# Patient Record
Sex: Female | Born: 1960 | Race: Black or African American | Hispanic: No | Marital: Single | Smoking: Light tobacco smoker
Health system: Southern US, Community
[De-identification: ages and names within clinical notes are randomized; demographics above are authoritative.]

## PROBLEM LIST (undated history)

## (undated) DIAGNOSIS — E1140 Type 2 diabetes mellitus with diabetic neuropathy, unspecified: Secondary | ICD-10-CM

## (undated) DIAGNOSIS — I509 Heart failure, unspecified: Secondary | ICD-10-CM

## (undated) DIAGNOSIS — J449 Chronic obstructive pulmonary disease, unspecified: Secondary | ICD-10-CM

## (undated) DIAGNOSIS — K635 Polyp of colon: Secondary | ICD-10-CM

## (undated) DIAGNOSIS — R748 Abnormal levels of other serum enzymes: Secondary | ICD-10-CM

## (undated) DIAGNOSIS — G4700 Insomnia, unspecified: Secondary | ICD-10-CM

## (undated) DIAGNOSIS — I10 Essential (primary) hypertension: Secondary | ICD-10-CM

## (undated) DIAGNOSIS — C50919 Malignant neoplasm of unspecified site of unspecified female breast: Secondary | ICD-10-CM

## (undated) DIAGNOSIS — L97509 Non-pressure chronic ulcer of other part of unspecified foot with unspecified severity: Secondary | ICD-10-CM

## (undated) DIAGNOSIS — I739 Peripheral vascular disease, unspecified: Secondary | ICD-10-CM

## (undated) DIAGNOSIS — I5042 Chronic combined systolic (congestive) and diastolic (congestive) heart failure: Secondary | ICD-10-CM

## (undated) DIAGNOSIS — E119 Type 2 diabetes mellitus without complications: Secondary | ICD-10-CM

## (undated) DIAGNOSIS — F1290 Cannabis use, unspecified, uncomplicated: Secondary | ICD-10-CM

## (undated) DIAGNOSIS — F1020 Alcohol dependence, uncomplicated: Secondary | ICD-10-CM

## (undated) DIAGNOSIS — N183 Chronic kidney disease, stage 3 unspecified: Secondary | ICD-10-CM

## (undated) DIAGNOSIS — D649 Anemia, unspecified: Secondary | ICD-10-CM

## (undated) DIAGNOSIS — G4730 Sleep apnea, unspecified: Secondary | ICD-10-CM

## (undated) DIAGNOSIS — F419 Anxiety disorder, unspecified: Secondary | ICD-10-CM

## (undated) DIAGNOSIS — C801 Malignant (primary) neoplasm, unspecified: Secondary | ICD-10-CM

## (undated) DIAGNOSIS — G4733 Obstructive sleep apnea (adult) (pediatric): Secondary | ICD-10-CM

## (undated) DIAGNOSIS — I890 Lymphedema, not elsewhere classified: Secondary | ICD-10-CM

## (undated) DIAGNOSIS — E785 Hyperlipidemia, unspecified: Secondary | ICD-10-CM

## (undated) DIAGNOSIS — K859 Acute pancreatitis without necrosis or infection, unspecified: Secondary | ICD-10-CM

## (undated) DIAGNOSIS — F17210 Nicotine dependence, cigarettes, uncomplicated: Secondary | ICD-10-CM

## (undated) DIAGNOSIS — M109 Gout, unspecified: Secondary | ICD-10-CM

## (undated) HISTORY — DX: Polyp of colon: K63.5

## (undated) HISTORY — DX: Chronic kidney disease, stage 3 unspecified: N18.30

## (undated) HISTORY — DX: Lymphedema, not elsewhere classified: I89.0

## (undated) HISTORY — DX: Peripheral vascular disease, unspecified: I73.9

## (undated) HISTORY — DX: Nicotine dependence, cigarettes, uncomplicated: F17.210

## (undated) HISTORY — DX: Gout, unspecified: M10.9

## (undated) HISTORY — DX: Anemia, unspecified: D64.9

## (undated) HISTORY — DX: Obstructive sleep apnea (adult) (pediatric): G47.33

## (undated) HISTORY — DX: Type 2 diabetes mellitus with diabetic neuropathy, unspecified: E11.40

## (undated) HISTORY — DX: Alcohol dependence, uncomplicated: F10.20

## (undated) HISTORY — PX: COLONOSCOPY: SHX174

## (undated) HISTORY — DX: Anxiety disorder, unspecified: F41.9

## (undated) HISTORY — DX: Chronic combined systolic (congestive) and diastolic (congestive) heart failure: I50.42

## (undated) HISTORY — DX: Insomnia, unspecified: G47.00

## (undated) HISTORY — PX: ABDOMINAL HYSTERECTOMY: SHX81

## (undated) HISTORY — DX: Sleep apnea, unspecified: G47.30

## (undated) HISTORY — DX: Non-pressure chronic ulcer of other part of unspecified foot with unspecified severity: L97.509

## (undated) HISTORY — DX: Cannabis use, unspecified, uncomplicated: F12.90

## (undated) HISTORY — DX: Malignant neoplasm of unspecified site of unspecified female breast: C50.919

## (undated) NOTE — *Deleted (*Deleted)
Received verbal order from physician for restraints and physician failed to place order. Pt was removed from the restraints at 0937.

---
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
## 1999-08-09 ENCOUNTER — Encounter: Admission: RE | Admit: 1999-08-09 | Discharge: 1999-08-09 | Payer: Self-pay | Admitting: Obstetrics & Gynecology

## 1999-11-15 ENCOUNTER — Emergency Department (HOSPITAL_COMMUNITY): Admission: EM | Admit: 1999-11-15 | Discharge: 1999-11-15 | Payer: Self-pay | Admitting: Emergency Medicine

## 1999-11-15 ENCOUNTER — Encounter: Payer: Self-pay | Admitting: Emergency Medicine

## 2002-01-09 ENCOUNTER — Emergency Department (HOSPITAL_COMMUNITY): Admission: EM | Admit: 2002-01-09 | Discharge: 2002-01-09 | Payer: Self-pay | Admitting: Emergency Medicine

## 2002-01-09 ENCOUNTER — Encounter: Payer: Self-pay | Admitting: Emergency Medicine

## 2004-10-08 ENCOUNTER — Emergency Department (HOSPITAL_COMMUNITY): Admission: EM | Admit: 2004-10-08 | Discharge: 2004-10-08 | Payer: Self-pay | Admitting: Emergency Medicine

## 2014-02-18 DIAGNOSIS — I10 Essential (primary) hypertension: Secondary | ICD-10-CM | POA: Insufficient documentation

## 2014-03-06 DIAGNOSIS — G4733 Obstructive sleep apnea (adult) (pediatric): Secondary | ICD-10-CM | POA: Insufficient documentation

## 2014-04-03 DIAGNOSIS — R928 Other abnormal and inconclusive findings on diagnostic imaging of breast: Secondary | ICD-10-CM | POA: Insufficient documentation

## 2014-04-06 DIAGNOSIS — K219 Gastro-esophageal reflux disease without esophagitis: Secondary | ICD-10-CM | POA: Insufficient documentation

## 2014-04-06 DIAGNOSIS — R5383 Other fatigue: Secondary | ICD-10-CM | POA: Insufficient documentation

## 2014-04-06 DIAGNOSIS — R0683 Snoring: Secondary | ICD-10-CM | POA: Insufficient documentation

## 2014-05-01 DIAGNOSIS — I2510 Atherosclerotic heart disease of native coronary artery without angina pectoris: Secondary | ICD-10-CM

## 2014-05-01 HISTORY — DX: Atherosclerotic heart disease of native coronary artery without angina pectoris: I25.10

## 2014-05-20 DIAGNOSIS — C50911 Malignant neoplasm of unspecified site of right female breast: Secondary | ICD-10-CM | POA: Insufficient documentation

## 2014-06-05 DIAGNOSIS — F32A Depression, unspecified: Secondary | ICD-10-CM | POA: Insufficient documentation

## 2014-06-05 DIAGNOSIS — F419 Anxiety disorder, unspecified: Secondary | ICD-10-CM | POA: Insufficient documentation

## 2014-07-31 HISTORY — PX: MASTECTOMY: SHX3

## 2015-02-06 DIAGNOSIS — I890 Lymphedema, not elsewhere classified: Secondary | ICD-10-CM | POA: Insufficient documentation

## 2015-04-08 ENCOUNTER — Emergency Department (HOSPITAL_COMMUNITY)
Admission: EM | Admit: 2015-04-08 | Discharge: 2015-04-08 | Disposition: A | Discharge status: Home / Self Care | Payer: Medicaid Other | Attending: Emergency Medicine | Admitting: Emergency Medicine

## 2015-04-08 ENCOUNTER — Encounter (HOSPITAL_COMMUNITY): Payer: Self-pay

## 2015-04-08 ENCOUNTER — Emergency Department (HOSPITAL_COMMUNITY): Payer: Medicaid Other

## 2015-04-08 DIAGNOSIS — I10 Essential (primary) hypertension: Secondary | ICD-10-CM | POA: Insufficient documentation

## 2015-04-08 DIAGNOSIS — E1165 Type 2 diabetes mellitus with hyperglycemia: Secondary | ICD-10-CM | POA: Diagnosis not present

## 2015-04-08 DIAGNOSIS — J209 Acute bronchitis, unspecified: Secondary | ICD-10-CM

## 2015-04-08 DIAGNOSIS — R079 Chest pain, unspecified: Secondary | ICD-10-CM | POA: Diagnosis present

## 2015-04-08 DIAGNOSIS — Z87891 Personal history of nicotine dependence: Secondary | ICD-10-CM | POA: Diagnosis not present

## 2015-04-08 DIAGNOSIS — J440 Chronic obstructive pulmonary disease with acute lower respiratory infection: Secondary | ICD-10-CM | POA: Diagnosis not present

## 2015-04-08 DIAGNOSIS — R739 Hyperglycemia, unspecified: Secondary | ICD-10-CM

## 2015-04-08 DIAGNOSIS — Z853 Personal history of malignant neoplasm of breast: Secondary | ICD-10-CM | POA: Diagnosis not present

## 2015-04-08 HISTORY — DX: Malignant (primary) neoplasm, unspecified: C80.1

## 2015-04-08 HISTORY — DX: Hyperlipidemia, unspecified: E78.5

## 2015-04-08 HISTORY — DX: Chronic obstructive pulmonary disease, unspecified: J44.9

## 2015-04-08 HISTORY — DX: Type 2 diabetes mellitus without complications: E11.9

## 2015-04-08 HISTORY — DX: Essential (primary) hypertension: I10

## 2015-04-08 LAB — CBC
HCT: 40.8 % (ref 36.0–46.0)
Hemoglobin: 14 g/dL (ref 12.0–15.0)
MCH: 31.5 pg (ref 26.0–34.0)
MCHC: 34.3 g/dL (ref 30.0–36.0)
MCV: 91.7 fL (ref 78.0–100.0)
Platelets: 286 10*3/uL (ref 150–400)
RBC: 4.45 MIL/uL (ref 3.87–5.11)
RDW: 13.8 % (ref 11.5–15.5)
WBC: 7.7 10*3/uL (ref 4.0–10.5)

## 2015-04-08 LAB — BASIC METABOLIC PANEL
Anion gap: 10 (ref 5–15)
BUN: 17 mg/dL (ref 6–20)
CO2: 27 mmol/L (ref 22–32)
Calcium: 9.6 mg/dL (ref 8.9–10.3)
Chloride: 96 mmol/L — ABNORMAL LOW (ref 101–111)
Creatinine, Ser: 1.02 mg/dL — ABNORMAL HIGH (ref 0.44–1.00)
GFR calc Af Amer: >60 mL/min (ref >60)
GFR calc non Af Amer: >60 mL/min (ref >60)
Glucose, Bld: 513 mg/dL — ABNORMAL HIGH (ref 65–99)
Potassium: 4.2 mmol/L (ref 3.5–5.1)
Sodium: 133 mmol/L — ABNORMAL LOW (ref 135–145)

## 2015-04-08 LAB — I-STAT TROPONIN, ED: Troponin i, poc: 0.05 ng/mL (ref 0.00–0.08)

## 2015-04-08 LAB — CBG MONITORING, ED: Glucose-Capillary: 324 mg/dL — ABNORMAL HIGH (ref 65–99)

## 2015-04-08 MED ORDER — IPRATROPIUM-ALBUTEROL 0.5-2.5 (3) MG/3ML IN SOLN
3.0000 mL | Freq: Once | RESPIRATORY_TRACT | Status: AC
  Administered 2015-04-08: 3 mL via RESPIRATORY_TRACT
  Filled 2015-04-08: qty 3

## 2015-04-08 MED ORDER — AZITHROMYCIN 250 MG PO TABS: ORAL_TABLET | ORAL | Status: DC

## 2015-04-08 MED ORDER — INSULIN ASPART 100 UNIT/ML ~~LOC~~ SOLN
10.0000 [IU] | Freq: Once | SUBCUTANEOUS | Status: AC
  Administered 2015-04-08: 10 [IU] via INTRAVENOUS
  Filled 2015-04-08: qty 1

## 2015-04-08 MED ORDER — HYDROCOD POLST-CPM POLST ER 10-8 MG/5ML PO SUER: 5.0000 mL | Freq: Two times a day (BID) | ORAL | Status: DC | PRN

## 2015-04-08 MED ORDER — SODIUM CHLORIDE 0.9 % IV BOLUS (SEPSIS)
1000.0000 mL | Freq: Once | INTRAVENOUS | Status: AC
  Administered 2015-04-08: 1000 mL via INTRAVENOUS

## 2015-04-08 NOTE — ED Provider Notes (Signed)
CSN: [id number]     Arrival date & time 04/08/15  0448 History   First MD Initiated Contact with Patient 04/08/15 0534     Chief Complaint  Patient presents with  . Chest Pain     (Consider location/radiation/quality/duration/timing/severity/associated sxs/prior Treatment) HPI Comments: Patient is a 54 year old female with history of hypertension, diabetes, and COPD. She presents for evaluation of chest congestion and pressure. She reports URI-like symptoms for the past 2 weeks that are worsening. She states that she feels very weak, tired, has no energy, and is having difficulty sleeping at night due to the cough.  Patient is a 54 y.o. female presenting with chest pain. The history is provided by the patient.  Chest Pain Pain location:  Substernal area Pain quality: tightness   Pain radiates to:  Does not radiate Pain radiates to the back: no   Pain severity:  Moderate Onset quality:  Gradual Duration:  2 weeks Timing:  Constant Progression:  Worsening Chronicity:  New Relieved by:  Nothing Worsened by:  Nothing tried Ineffective treatments:  None tried   Past Medical History  Diagnosis Date  . Cancer (HCC)     breast  . Hypertension   . Diabetes mellitus without complication ([place])   . COPD (chronic obstructive pulmonary disease) ([place])   . Hyperlipemia    Past Surgical History  Procedure Laterality Date  . Mastectomy Right April 2016  . Abdominal hysterectomy     No family history on file. Social History  Substance Use Topics  . Smoking status: Former Smoker    Quit date: 03/24/2015  . Smokeless tobacco: Never Used  . Alcohol Use: Yes     Comment: Beer - "on the weekends hanging out, maybe 2 or 3"    OB History    No data available     Review of Systems  Cardiovascular: Positive for chest pain.  All other systems reviewed and are negative.     Allergies  Aspirin  Home Medications   Prior to Admission medications   Not on File   BP 131/77 mmHg   Pulse 89  Temp(Src) 98 F (36.7 C) (Oral)  Resp 8  Ht 5\' 6"  (1.676 m)  Wt 198 lb (89.812 kg)  BMI 31.97 kg/m2  SpO2 98% Physical Exam  Constitutional: She is oriented to person, place, and time. She appears well-developed and well-nourished. No distress.  HENT:  Head: Normocephalic and atraumatic.  Mouth/Throat: Oropharynx is clear and moist.  Neck: Normal range of motion. Neck supple.  Cardiovascular: Normal rate and regular rhythm.  Exam reveals no gallop and no friction rub.   No murmur heard. Pulmonary/Chest: Effort normal. No respiratory distress. She has wheezes. She has no rales.  There are scattered rhonchi bilaterally.  Abdominal: Soft. Bowel sounds are normal. She exhibits no distension. There is no tenderness.  Musculoskeletal: Normal range of motion. She exhibits no edema.  Neurological: She is alert and oriented to person, place, and time.  Skin: Skin is warm and dry. She is not diaphoretic.  Nursing note and vitals reviewed.   ED Course  Procedures (including critical care time) Labs Review Labs Reviewed  CBC  BASIC METABOLIC PANEL  Randolm Idol, ED    Imaging Review Dg Chest 2 View  04/08/2015  CLINICAL DATA:  54 year old female with mid chest pain EXAM: CHEST  2 VIEW COMPARISON:  None. FINDINGS: Two views of the chest demonstrate minimal bibasilar atelectatic changes. No focal consolidation, pleural effusion, or pneumothorax. The cardiac silhouette is  within normal limits. The osseous structures are grossly unremarkable. IMPRESSION: No active cardiopulmonary disease. Electronically Signed   By: Anner Crete M.D.   On: 04/08/2015 05:32   I have personally reviewed and evaluated these images and lab results as part of my medical decision-making.   EKG Interpretation   Date/Time:  Thursday April 08 2015 04:57:19 EST Ventricular Rate:  90 PR Interval:  128 QRS Duration: 74 QT Interval:  352 QTC Calculation: 431 R Axis:   59 Text Interpretation:   Sinus rhythm Probable left atrial enlargement  Nonspecific T abnormalities, lateral leads Confirmed by Rebbeca Sheperd  MD, Israel Werts  ([id number]) on 04/08/2015 6:08:35 AM      MDM   Final diagnoses:  None    Patient is a 54 year old female who presents here with complaints of weakness, chest congestion, productive cough for the past 2 weeks. She presents today with complaints of severe weakness and inability to rest. She is normally seen and [place], however is here for her son's funeral.  Her workup today reveals no cardiac abnormality. She has nonspecific T wave changes on her EKG, however negative troponin. Her sugar is elevated at 513, however there is no evidence for ketoacidosis. She will be treated with IV fluids and insulin. She states that she has been out of her diabetes medications for several days due to the funeral. She is to call her doctor to make arrangements to have these medications refilled.    Veryl Speak, MD 04/08/15 [phone]

## 2015-04-08 NOTE — ED Notes (Signed)
Patient transported to X-ray 

## 2015-04-08 NOTE — ED Provider Notes (Signed)
Patient accepted in sign out pending treatment for non acidotic hyperglycemia.  Patient reports not having DM medications for long period of time.  On my reevaluation patient in no acute distress.  Says that she will follow up outpatient to get her chronic medical conditions managed.  Agree symptoms appear infectious and patient does admit to being a smoker and getting URI every year. Repeat glucose 324. Patient discharged in stable condition.  Strict return precautions given.  Harvel Quale, MD 04/08/15 [phone]

## 2015-04-08 NOTE — ED Notes (Signed)
Pt also complaining of left wrist pain, pt states that she doesn't know what happened to it. Slight swelling noted.

## 2015-04-08 NOTE — Discharge Instructions (Signed)
Call your primary doctor for refills of your diabetic medications.  Zithromax as prescribed.  Tussionex as prescribed as needed for cough.   Hyperglycemia Hyperglycemia occurs when the glucose (sugar) in your blood is too high. Hyperglycemia can happen for many reasons, but it most often happens to people who do not know they have diabetes or are not managing their diabetes properly.  CAUSES  Whether you have diabetes or not, there are other causes of hyperglycemia. Hyperglycemia can occur when you have diabetes, but it can also occur in other situations that you might not be as aware of, such as: Diabetes  If you have diabetes and are having problems controlling your blood glucose, hyperglycemia could occur because of some of the following reasons:  Not following your meal plan.  Not taking your diabetes medications or not taking it properly.  Exercising less or doing less activity than you normally do.  Being sick. Pre-diabetes  This cannot be ignored. Before people develop Type 2 diabetes, they almost always have "pre-diabetes." This is when your blood glucose levels are higher than normal, but not yet high enough to be diagnosed as diabetes. Research has shown that some long-term damage to the body, especially the heart and circulatory system, may already be occurring during pre-diabetes. If you take action to manage your blood glucose when you have pre-diabetes, you may delay or prevent Type 2 diabetes from developing. Stress  If you have diabetes, you may be "diet" controlled or on oral medications or insulin to control your diabetes. However, you may find that your blood glucose is higher than usual in the hospital whether you have diabetes or not. This is often referred to as "stress hyperglycemia." Stress can elevate your blood glucose. This happens because of hormones put out by the body during times of stress. If stress has been the cause of your high blood glucose, it can be  followed regularly by your caregiver. That way he/she can make sure your hyperglycemia does not continue to get worse or progress to diabetes. Steroids  Steroids are medications that act on the infection fighting system (immune system) to block inflammation or infection. One side effect can be a rise in blood glucose. Most people can produce enough extra insulin to allow for this rise, but for those who cannot, steroids make blood glucose levels go even higher. It is not unusual for steroid treatments to "uncover" diabetes that is developing. It is not always possible to determine if the hyperglycemia will go away after the steroids are stopped. A special blood test called an A1c is sometimes done to determine if your blood glucose was elevated before the steroids were started. SYMPTOMS  Thirsty.  Frequent urination.  Dry mouth.  Blurred vision.  Tired or fatigue.  Weakness.  Sleepy.  Tingling in feet or leg. DIAGNOSIS  Diagnosis is made by monitoring blood glucose in one or all of the following ways:  A1c test. This is a chemical found in your blood.  Fingerstick blood glucose monitoring.  Laboratory results. TREATMENT  First, knowing the cause of the hyperglycemia is important before the hyperglycemia can be treated. Treatment may include, but is not be limited to:  Education.  Change or adjustment in medications.  Change or adjustment in meal plan.  Treatment for an illness, infection, etc.  More frequent blood glucose monitoring.  Change in exercise plan.  Decreasing or stopping steroids.  Lifestyle changes. HOME CARE INSTRUCTIONS   Test your blood glucose as directed.  Exercise  regularly. Your caregiver will give you instructions about exercise. Pre-diabetes or diabetes which comes on with stress is helped by exercising.  Eat wholesome, balanced meals. Eat often and at regular, fixed times. Your caregiver or nutritionist will give you a meal plan to guide  your sugar intake.  Being at an ideal weight is important. If needed, losing as little as 10 to 15 pounds may help improve blood glucose levels. SEEK MEDICAL CARE IF:   You have questions about medicine, activity, or diet.  You continue to have symptoms (problems such as increased thirst, urination, or weight gain). SEEK IMMEDIATE MEDICAL CARE IF:   You are vomiting or have diarrhea.  Your breath smells fruity.  You are breathing faster or slower.  You are very sleepy or incoherent.  You have numbness, tingling, or pain in your feet or hands.  You have chest pain.  Your symptoms get worse even though you have been following your caregiver's orders.  If you have any other questions or concerns.   This information is not intended to replace advice given to you by your health care provider. Make sure you discuss any questions you have with your health care provider.   Document Released: 10/11/2000 Document Revised: 07/10/2011 Document Reviewed: 12/22/2014 Elsevier Interactive Patient Education 2016 [employer]  Acute Bronchitis Bronchitis is inflammation of the airways that extend from the windpipe into the lungs (bronchi). The inflammation often causes mucus to develop. This leads to a cough, which is the most common symptom of bronchitis.  In acute bronchitis, the condition usually develops suddenly and goes away over time, usually in a couple weeks. Smoking, allergies, and asthma can make bronchitis worse. Repeated episodes of bronchitis may cause further lung problems.  CAUSES Acute bronchitis is most often caused by the same virus that causes a cold. The virus can spread from person to person (contagious) through coughing, sneezing, and touching contaminated objects. SIGNS AND SYMPTOMS   Cough.   Fever.   Coughing up mucus.   Body aches.   Chest congestion.   Chills.   Shortness of breath.   Sore throat.  DIAGNOSIS  Acute bronchitis is usually  diagnosed through a physical exam. Your health care provider will also ask you questions about your medical history. Tests, such as chest X-rays, are sometimes done to rule out other conditions.  TREATMENT  Acute bronchitis usually goes away in a couple weeks. Oftentimes, no medical treatment is necessary. Medicines are sometimes given for relief of fever or cough. Antibiotic medicines are usually not needed but may be prescribed in certain situations. In some cases, an inhaler may be recommended to help reduce shortness of breath and control the cough. A cool mist vaporizer may also be used to help thin bronchial secretions and make it easier to clear the chest.  HOME CARE INSTRUCTIONS  Get plenty of rest.   Drink enough fluids to keep your urine clear or pale yellow (unless you have a medical condition that requires fluid restriction). Increasing fluids may help thin your respiratory secretions (sputum) and reduce chest congestion, and it will prevent dehydration.   Take medicines only as directed by your health care provider.  If you were prescribed an antibiotic medicine, finish it all even if you start to feel better.  Avoid smoking and secondhand smoke. Exposure to cigarette smoke or irritating chemicals will make bronchitis worse. If you are a smoker, consider using nicotine gum or skin patches to help control withdrawal symptoms. Quitting smoking will help  your lungs heal faster.   Reduce the chances of another bout of acute bronchitis by washing your hands frequently, avoiding people with cold symptoms, and trying not to touch your hands to your mouth, nose, or eyes.   Keep all follow-up visits as directed by your health care provider.  SEEK MEDICAL CARE IF: Your symptoms do not improve after 1 week of treatment.  SEEK IMMEDIATE MEDICAL CARE IF:  You develop an increased fever or chills.   You have chest pain.   You have severe shortness of breath.  You have bloody sputum.    You develop dehydration.  You faint or repeatedly feel like you are going to pass out.  You develop repeated vomiting.  You develop a severe headache. MAKE SURE YOU:   Understand these instructions.  Will watch your condition.  Will get help right away if you are not doing well or get worse.   This information is not intended to replace advice given to you by your health care provider. Make sure you discuss any questions you have with your health care provider.   Document Released: 05/25/2004 Document Revised: 05/08/2014 Document Reviewed: 10/08/2012 Elsevier Interactive Patient Education [employer].

## 2015-04-08 NOTE — ED Notes (Signed)
Per [facility]: Pt from home, c/o chest pressure, burning sensation that started a week a go that has progressively getting worse. Pt alsop has a cough, and that the chest pain gets worse with cough. Cough started about 2 weeks ago, non productive. Pt states that she was running a fever when the cough started, but does not have a fever. Pt did not measure temperature. Lung sounds "congested". 12 lead unremarkable. No nausea, no vomiting, no diarrhea. Pt does have shortness of breath when she gets up and walks around, about 1 week.

## 2015-04-08 NOTE — ED Notes (Signed)
   CBG 324   

## 2015-04-24 DIAGNOSIS — I739 Peripheral vascular disease, unspecified: Secondary | ICD-10-CM | POA: Insufficient documentation

## 2015-04-24 DIAGNOSIS — F1410 Cocaine abuse, uncomplicated: Secondary | ICD-10-CM | POA: Insufficient documentation

## 2016-01-16 DIAGNOSIS — F17200 Nicotine dependence, unspecified, uncomplicated: Secondary | ICD-10-CM | POA: Insufficient documentation

## 2016-06-22 DIAGNOSIS — G479 Sleep disorder, unspecified: Secondary | ICD-10-CM | POA: Insufficient documentation

## 2016-09-08 DIAGNOSIS — G8929 Other chronic pain: Secondary | ICD-10-CM | POA: Insufficient documentation

## 2016-09-08 DIAGNOSIS — R1110 Vomiting, unspecified: Secondary | ICD-10-CM | POA: Insufficient documentation

## 2016-09-26 DIAGNOSIS — R3129 Other microscopic hematuria: Secondary | ICD-10-CM | POA: Insufficient documentation

## 2016-09-27 ENCOUNTER — Encounter: Payer: Self-pay | Admitting: Nurse Practitioner

## 2017-03-01 DIAGNOSIS — B9681 Helicobacter pylori [H. pylori] as the cause of diseases classified elsewhere: Secondary | ICD-10-CM | POA: Insufficient documentation

## 2017-10-24 DIAGNOSIS — N390 Urinary tract infection, site not specified: Secondary | ICD-10-CM | POA: Insufficient documentation

## 2017-10-24 DIAGNOSIS — Z794 Long term (current) use of insulin: Secondary | ICD-10-CM | POA: Insufficient documentation

## 2017-10-24 DIAGNOSIS — E1165 Type 2 diabetes mellitus with hyperglycemia: Secondary | ICD-10-CM | POA: Insufficient documentation

## 2017-10-24 DIAGNOSIS — N3281 Overactive bladder: Secondary | ICD-10-CM | POA: Insufficient documentation

## 2018-02-20 DIAGNOSIS — E782 Mixed hyperlipidemia: Secondary | ICD-10-CM | POA: Insufficient documentation

## 2018-02-20 DIAGNOSIS — Z8673 Personal history of transient ischemic attack (TIA), and cerebral infarction without residual deficits: Secondary | ICD-10-CM | POA: Insufficient documentation

## 2018-02-20 DIAGNOSIS — E1110 Type 2 diabetes mellitus with ketoacidosis without coma: Secondary | ICD-10-CM | POA: Insufficient documentation

## 2018-10-31 ENCOUNTER — Other Ambulatory Visit: Payer: Self-pay | Admitting: Family

## 2018-10-31 DIAGNOSIS — Z1231 Encounter for screening mammogram for malignant neoplasm of breast: Secondary | ICD-10-CM

## 2018-11-27 ENCOUNTER — Other Ambulatory Visit: Payer: Self-pay | Admitting: Family

## 2018-11-28 ENCOUNTER — Encounter: Payer: Self-pay | Admitting: Gastroenterology

## 2018-11-28 ENCOUNTER — Other Ambulatory Visit: Payer: Self-pay | Admitting: Gastroenterology

## 2018-12-02 ENCOUNTER — Ambulatory Visit: Payer: Self-pay | Admitting: Gastroenterology

## 2018-12-11 ENCOUNTER — Encounter: Payer: Self-pay | Admitting: Hematology and Oncology

## 2018-12-11 ENCOUNTER — Telehealth: Payer: Self-pay | Admitting: Hematology and Oncology

## 2018-12-11 NOTE — Telephone Encounter (Signed)
Received a new patient referral from Morgan Folks, NP from [facility] for hx of breast cancer. I cld and spoke to Morgan Beasley to schedule a new pt appt w/Dr. Lindi Adie on 9/9 at 1pm. She's been made aware to arrive 15 minutes early. Letter mailed.

## 2018-12-12 ENCOUNTER — Other Ambulatory Visit: Payer: Self-pay | Admitting: Family

## 2018-12-12 ENCOUNTER — Other Ambulatory Visit: Payer: Self-pay

## 2018-12-12 DIAGNOSIS — N63 Unspecified lump in unspecified breast: Secondary | ICD-10-CM

## 2018-12-19 ENCOUNTER — Ambulatory Visit: Payer: Self-pay | Admitting: Internal Medicine

## 2018-12-20 ENCOUNTER — Other Ambulatory Visit: Payer: Self-pay | Admitting: Family

## 2018-12-20 ENCOUNTER — Other Ambulatory Visit: Payer: Self-pay

## 2018-12-20 ENCOUNTER — Ambulatory Visit
Admission: RE | Admit: 2018-12-20 | Discharge: 2018-12-20 | Disposition: A | Discharge status: Home / Self Care | Payer: Medicare Other | Source: Ambulatory Visit | Attending: Family | Admitting: Family

## 2018-12-20 DIAGNOSIS — N644 Mastodynia: Secondary | ICD-10-CM

## 2018-12-20 DIAGNOSIS — Z9011 Acquired absence of right breast and nipple: Secondary | ICD-10-CM

## 2018-12-20 DIAGNOSIS — N63 Unspecified lump in unspecified breast: Secondary | ICD-10-CM

## 2018-12-24 ENCOUNTER — Ambulatory Visit: Payer: Self-pay | Admitting: Nurse Practitioner

## 2019-01-07 NOTE — Progress Notes (Signed)
[name] NOTE  Patient Care Team: Sonia Side., FNP as PCP - General (Family Medicine)  CHIEF COMPLAINTS/PURPOSE OF CONSULTATION:  History of right breast cancer  HISTORY OF PRESENTING ILLNESS:  Morgan Beasley 58 y.o. female is here because of a history of stage IIA right breast invasive ductal carcinoma in 2015. The cancer was detected on a routine screening mammogram on 03/30/14, which showed an abnormality at the 10:00 position. Biopsy on 04/15/14 showed invasive ductal carcinoma, grade 2, ER+ 90%, PR+ 60%, HER-2 negative (0). She underwent a right lumpectomy with sentinel lymph node biopsy with Dr. Mable Fill Howard-McNatt on 06/15/14 for which pathology showed invasive ductal carcinoma, 3-4cm, positive posterior and lateral margins with DCIS, 0/3 lymph nodes positive. She underwent a right mastectomy on 08/03/14 for which pathology showed multiple minute foci of intermediate grade DCIS and clear margins. Oncotype score was 17, with an 11% risk of distant recurrence in 1 years without systemic treatment and as a result she did not undergo chemotherapy. She began anti-estrogen therapy with letrozole for one month from 10/14/14-10/2014 but discontinued it due to side effects. She again began anti-estrogen therapy with exemestane on 10/20/16 for one week and discontinued it due to side effects. She declined further anti-estrogen therapy. She has been followed at [facility] by Dr. Harvie Heck and Dr. Sol Blazing. She has a history of a breast mass at age 16, for which she underwent a surgical resection and radiation therapy.   Mammogram of the left breast and bilateral US on 12/20/18 after the patient palpated lumps in the left breast and pain at her right mastectomy site showed no evidence of malignancy. She presents to the clinic today for initial evaluation and transfer of care.   I reviewed her records extensively and collaborated the history with the patient.   SUMMARY OF ONCOLOGIC HISTORY: Oncology History  Malignant neoplasm of upper-outer quadrant of right breast in female, estrogen receptor positive ([place])  04/15/2014 Initial Diagnosis   screening mammogram on 03/30/14, which showed an abnormality at the 10:00 position. Biopsy on 04/15/14 showed invasive ductal carcinoma, grade 2, ER+ 90%, PR+ 60%, HER-2 negative (0)   06/15/2014 Surgery   Right lumpectomy with sentinel lymph node biopsy with Dr. Mable Fill Howard-McNatt on 06/15/14 for which pathology showed invasive ductal carcinoma, 3-4cm, positive posterior and lateral margins with DCIS, 0/3 lymph nodes positive. She underwent a right mastectomy on 08/03/14 for which pathology showed multiple minute foci of intermediate grade DCIS and clear margins.   06/17/2014 Oncotype testing   Oncotype DX recurrence score 17: 11% risk of distant recurrence   10/14/2014 - 10/2014 Anti-estrogen oral therapy   Letrozole for 1 month stopped for toxicities, exemestane started 622 2018 x 1 week discontinued due to toxicities.  Refused further antiestrogen therapy  followed at [facility] by Dr. Harvie Heck and Dr. Sol Blazing     MEDICAL HISTORY:  Past Medical History:  Diagnosis Date  . Alcohol dependence ([place])   . Anxiety   . Breast cancer ([place])   . CHF (congestive heart failure) ([place])   . Cigarette nicotine dependence   . Colon polyps   . COPD (chronic obstructive pulmonary disease) ([place])   . Diabetes mellitus without complication ([place])   . Diabetic neuropathy ([place])   . Elevated lipase   . Hyperlipemia   . Hypertension   . Insomnia   . Lymphedema   . Marijuana use   . OSA treated with BiPAP   .  PAD (peripheral artery disease) ([place])   . Ulcer of foot ([place])    right    SURGICAL HISTORY: Past Surgical History:  Procedure Laterality Date  . ABDOMINAL HYSTERECTOMY    . COLONOSCOPY     [facility] hospital  . MASTECTOMY Right April 2016    SOCIAL HISTORY: Social History   Socioeconomic History   . Marital status: Single    Spouse name: Not on file  . Number of children: Not on file  . Years of education: Not on file  . Highest education level: Not on file  Occupational History  . Not on file  Social Needs  . Financial resource strain: Not on file  . Food insecurity    Worry: Not on file    Inability: Not on file  . Transportation needs    Medical: Not on file    Non-medical: Not on file  Tobacco Use  . Smoking status: Former Smoker    Quit date: 03/24/2015    Years since quitting: 3.7  . Smokeless tobacco: Never Used  Substance and Sexual Activity  . Alcohol use: Yes    Comment: Beer - "on the weekends hanging out, maybe 2 or 3"   . Drug use: Yes    Types: Marijuana    Comment: Last use November   . Sexual activity: Not on file  Lifestyle  . Physical activity    Days per week: Not on file    Minutes per session: Not on file  . Stress: Not on file  Relationships  . Social Herbalist on phone: Not on file    Gets together: Not on file    Attends religious service: Not on file    Active member of club or organization: Not on file    Attends meetings of clubs or organizations: Not on file    Relationship status: Not on file  . Intimate partner violence    Fear of current or ex partner: Not on file    Emotionally abused: Not on file    Physically abused: Not on file    Forced sexual activity: Not on file  Other Topics Concern  . Not on file  Social History Narrative  . Not on file    FAMILY HISTORY: Family History  Problem Relation Age of Onset  . Diabetes Other   . Heart disease Other     ALLERGIES:  is allergic to aspirin.  MEDICATIONS:  Current Outpatient Medications  Medication Sig Dispense Refill  . albuterol (PROAIR HFA) 108 (90 Base) MCG/ACT inhaler Inhale 2 puffs into the lungs every 6 (six) hours as needed.    Marland Kitchen amitriptyline (ELAVIL) 10 MG tablet Take 10 mg by mouth at bedtime.    Marland Kitchen atorvastatin (LIPITOR) 40 MG tablet Take 40  mg by mouth daily.    Marland Kitchen azithromycin (ZITHROMAX Z-PAK) 250 MG tablet 2 po day one, then 1 daily x 4 days 6 tablet 0  . budesonide-formoterol (SYMBICORT) 160-4.5 MCG/ACT inhaler Inhale 2 puffs into the lungs 2 (two) times daily.    . chlorpheniramine-HYDROcodone (TUSSIONEX PENNKINETIC ER) 10-8 MG/5ML SUER Take 5 mLs by mouth every 12 (twelve) hours as needed for cough. 140 mL 0  . cilostazol (PLETAL) 100 MG tablet Take 100 mg by mouth 2 (two) times daily.    . clopidogrel (PLAVIX) 75 MG tablet Take 75 mg by mouth daily.    . cyclobenzaprine (FLEXERIL) 5 MG tablet Take 1 tablet by mouth at bedtime.    Marland Kitchen  diltiazem (DILACOR XR) 120 MG 24 hr capsule Take 120 mg by mouth daily.    . furosemide (LASIX) 40 MG tablet Take 40 mg by mouth daily.    Marland Kitchen gabapentin (NEURONTIN) 800 MG tablet Take 800 mg by mouth 3 (three) times daily.    Marland Kitchen glimepiride (AMARYL) 4 MG tablet Take 4 mg by mouth daily with breakfast.    . HYDROcodone-acetaminophen (NORCO/VICODIN) 5-325 MG tablet Take 1 tablet by mouth every 6 (six) hours as needed for moderate pain.    Marland Kitchen insulin glargine (LANTUS) 100 UNIT/ML injection Inject 80 Units into the skin daily.    . insulin lispro (HUMALOG) 100 UNIT/ML injection Inject 14 Units into the skin 3 (three) times daily before meals.    . mirabegron ER (MYRBETRIQ) 50 MG TB24 tablet Take 50 mg by mouth daily.    . ondansetron (ZOFRAN) 4 MG tablet Take 4 mg by mouth every 8 (eight) hours as needed for nausea or vomiting.    . potassium chloride (K-DUR) 10 MEQ tablet Take 10 mEq by mouth daily.    . promethazine (PHENERGAN) 25 MG tablet Take 25 mg by mouth every 6 (six) hours as needed for nausea or vomiting.     No current facility-administered medications for this visit.     REVIEW OF SYSTEMS:   Constitutional: Denies fevers, chills or abnormal night sweats Eyes: Denies blurriness of vision, double vision or watery eyes Ears, nose, mouth, throat, and face: Denies mucositis or sore throat  Respiratory: Denies cough, dyspnea or wheezes Cardiovascular: Denies palpitation, chest discomfort or lower extremity swelling Gastrointestinal:  Denies nausea, heartburn or change in bowel habits Skin: Denies abnormal skin rashes Lymphatics: Denies new lymphadenopathy or easy bruising Neurological:Denies numbness, tingling or new weaknesses Behavioral/Psych: Mood is stable, no new changes  Breast: s/p right mastectomy All other systems were reviewed with the patient and are negative.  PHYSICAL EXAMINATION: ECOG PERFORMANCE STATUS: 1 - Symptomatic but completely ambulatory  Vitals:   01/08/19 1333  BP: (!) 152/75  Pulse: (!) 101  Resp: 18  Temp: 98.5 F (36.9 C)  SpO2: 100%   Filed Weights   01/08/19 1333  Weight: 202 lb 1.6 oz (91.7 kg)    GENERAL:alert, no distress and comfortable SKIN: skin color, texture, turgor are normal, no rashes or significant lesions EYES: normal, conjunctiva are pink and non-injected, sclera clear OROPHARYNX:no exudate, no erythema and lips, buccal mucosa, and tongue normal  NECK: supple, thyroid normal size, non-tender, without nodularity LYMPH:  no palpable lymphadenopathy in the cervical, axillary or inguinal LUNGS: clear to auscultation and percussion with normal breathing effort HEART: regular rate & rhythm and no murmurs and no lower extremity edema ABDOMEN:abdomen soft, non-tender and normal bowel sounds Musculoskeletal:no cyanosis of digits and no clubbing  PSYCH: alert & oriented x 3 with fluent speech NEURO: no focal motor/sensory deficits BREAST: No palpable nodules in breast. No palpable axillary or supraclavicular lymphadenopathy (exam performed in the presence of a chaperone)   LABORATORY DATA:  I have reviewed the data as listed Lab Results  Component Value Date   WBC 7.7 04/08/2015   HGB 14.0 04/08/2015   HCT 40.8 04/08/2015   MCV 91.7 04/08/2015   PLT 286 04/08/2015   Lab Results  Component Value Date   NA 133 (L)  04/08/2015   K 4.2 04/08/2015   CL 96 (L) 04/08/2015   CO2 27 04/08/2015    RADIOGRAPHIC STUDIES: I have personally reviewed the radiological reports and agreed with the findings  in the report.  ASSESSMENT AND PLAN:  Malignant neoplasm of upper-outer quadrant of right breast in female, estrogen receptor positive ([place]) 06/15/2014: Screening mammogram detected right breast IDC grade 2, ER 90%, PR 60%, HER-2 negative, status post initial lumpectomy followed by mastectomy, final size 3 to 4 cm 0/3 lymph nodes negative with multifocal DCIS on mastectomy specimen. Oncotype DX score 17: Distant recurrence rate 11% Could not tolerate antiestrogen therapy  Breast cancer surveillance: Left breast mammogram 12/20/2018: Benign, ultrasound right reconstructed breasts normal. Severe left breast pain: Unclear etiology.  Mammogram ultrasound did not show any cause. We will obtain a breast MRI for further evaluation. I will see her after the breast MRI to discuss results.  All questions were answered. The patient knows to call the clinic with any problems, questions or concerns.   Rulon Eisenmenger, MD 01/08/2019    I, Molly Dorshimer, am acting as scribe for Nicholas Lose, MD.  I have reviewed the above documentation for accuracy and completeness, and I agree with the above.

## 2019-01-08 ENCOUNTER — Other Ambulatory Visit: Payer: Self-pay

## 2019-01-08 ENCOUNTER — Inpatient Hospital Stay: Payer: Medicare Other | Attending: Hematology and Oncology | Admitting: Hematology and Oncology

## 2019-01-08 DIAGNOSIS — Z853 Personal history of malignant neoplasm of breast: Secondary | ICD-10-CM | POA: Diagnosis present

## 2019-01-08 DIAGNOSIS — Z1231 Encounter for screening mammogram for malignant neoplasm of breast: Secondary | ICD-10-CM

## 2019-01-08 DIAGNOSIS — C50411 Malignant neoplasm of upper-outer quadrant of right female breast: Secondary | ICD-10-CM | POA: Insufficient documentation

## 2019-01-08 DIAGNOSIS — Z170 Estrogen receptor positive status [ER+]: Secondary | ICD-10-CM

## 2019-01-08 NOTE — Assessment & Plan Note (Signed)
06/15/2014: Screening mammogram detected right breast IDC grade 2, ER 90%, PR 60%, HER-2 negative, status post initial lumpectomy followed by mastectomy, final size 3 to 4 cm 0/3 lymph nodes negative with multifocal DCIS on mastectomy specimen. Oncotype DX score 17: Distant recurrence rate 11% Could not tolerate antiestrogen therapy  Breast cancer surveillance: Left breast mammogram 12/20/2018: Benign, ultrasound right reconstructed breasts normal. Return to clinic in 1 year for follow-up

## 2019-01-09 ENCOUNTER — Telehealth: Payer: Self-pay | Admitting: Hematology and Oncology

## 2019-01-09 NOTE — Telephone Encounter (Signed)
I talk with patient regarding schedule for 9/28

## 2019-01-14 ENCOUNTER — Other Ambulatory Visit: Payer: Self-pay

## 2019-01-14 ENCOUNTER — Ambulatory Visit (INDEPENDENT_AMBULATORY_CARE_PROVIDER_SITE_OTHER): Payer: Medicare Other | Admitting: Nurse Practitioner

## 2019-01-14 ENCOUNTER — Encounter: Payer: Self-pay | Admitting: Nurse Practitioner

## 2019-01-14 VITALS — BP 134/82 | HR 103 | Temp 97.6°F | Ht 66.0 in | Wt 203.0 lb

## 2019-01-14 DIAGNOSIS — Z86010 Personal history of colonic polyps: Secondary | ICD-10-CM

## 2019-01-14 DIAGNOSIS — G8929 Other chronic pain: Secondary | ICD-10-CM

## 2019-01-14 DIAGNOSIS — K861 Other chronic pancreatitis: Secondary | ICD-10-CM

## 2019-01-14 MED ORDER — PANCRELIPASE (LIP-PROT-AMYL) 36000-114000 UNITS PO CPEP: ORAL_CAPSULE | ORAL | 0 refills | Status: DC

## 2019-01-14 MED ORDER — PANCRELIPASE (LIP-PROT-AMYL) 36000-114000 UNITS PO CPEP: ORAL_CAPSULE | ORAL | 5 refills | Status: DC

## 2019-01-14 NOTE — Progress Notes (Addendum)
ASSESSMENT / PLAN:   25.  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  Alcoholism in remission.  She is new to the practice today, relocated her from [place].  Reviewed GI records in Care Everywhere from [place] in 2018.  She was basically lost to follow-up after a couple of visits with them.  Been seeing Pain Specialist in [place] but not getting relief with Lyrica and here with worsening of her chronic abdominal pain.   Hydrocodone prescribed by PCP helps.  -Hard to get a history from [place], she is crying and difficult to stay on topic.  CT scan was 2+ years ago.  Will obtain CTAP to further evaluate worsening abdominal pain.  It has to be without IV contrast given her renal function -Resume Creon as it did help with bloating.  She will take 72K units with meals and 36K with each snack. -Continue to abstain from alcohol -She is non-toxic appearing. Will see what CT scan shows and how she does with resumption of pancreatic enzymes. She may need to return to Pain Specialist, especially if this is in fact chronic pancreatitis.  -Appears patient was scheduled for EGD with [facility] in 2018 but I cannot see the actual report and not sure that it was done.  Will request report. -will make follow up appointment. -I explained that we do not typically write for narcotics.    2. Hx of colon polyps Sept 2017, 5 year follow up recommended.   -Again,  unable to see any reports in Care Everywhere,  will request report .   Addendum 03/11/2019  Records Received: EGD by [facility] in [place] - Dr. Christoper Fabian.   Exam performed 09/27/2016 for evaluation of RUQ pain, nausea and vomiting. Findings included a normal esophagus, duodenitis and gastritis.  Gastric biopsy showed moderate, chronic inflammation and reactive changes.  Positive for H. pylori organisms.  Comments on path note showed that staff may have been unable to get in  touch with patient in June 2018 for H. pylori treatment.  Did not receive colonoscopy report in these records. I will request this again.   Patient saw Dr. Havery Moros in follow up since this visit with me. I will forward this addendum to him.  I do believe he will be seeing her back in a few months.  He may wish to check for H pylori eradication at that time (assuming she did get treated)   HPI:    Chief Complaint:   Pancreatitis  Patient is a 58 year old female with multiple medical problems not limited to breast cancer s/p right mastectomy, DM 2, COPD, OSA, hypertension, history of alcohol abuse, calcific pancreatitis and H.pylori gastritis. She is referred by PCP Dustin Folks, NP at [facility] evaluation of pancreatitis.  In late April patient's lipase was 365, liver tests were normal (PCP labs).  She was advised to stop Etoh. At time of her follow-up visit with PCP in June patient was still having abdominal pain and nausea. She was referred to [place] but it appears her previous appointment was canceled .  I found records in care everywhere.  Patient was seen a couple times in 2018 by [facility], she has now relocated to [place]  It is difficult to obtain a history from [place].  She is crying and having difficulty staying on topic.  She describes terrible abdominal pain.   Per  [facility] records in Care Everywhere.     Patient has chronic RUQ pain, nausea / vomiting and loose stools.  Multiple CT scans over last several years have been negative for acute findings but have shown a few scattered pancreatic calcifications. Abdominal ultrasound March 2018 revealed, normal appearing gallbladder,  mild hepatomegaly.  Apparently a HIDA scan in January 2018 was normal.   Patient was advised to discontinue alcohol.  She was started on Creon.  She apparently had a colonoscopy in Sept 2017 with removal of a tubular adenoma.  Patient hasn't been back to GI in a couple of years. She has relocated to [place].    Ismay was seeing a Pain Management group in [place].  She was tried on Cymbalta but it did not help.  Following that she was put on Lyrica but it hasn't helped. PCP gave her Hydrocodone and that is the only thing that helps.  Has not taken Creon in a year and a half.  It helped the bloating but not the pain, she ran out of the prescription and says no one wanted to refill it.  She has generalized abdominal pain unrelated to eating. She complains of abdominal distention. Bowel movements overall normal except for occasional constipation.     Past Medical History:  Diagnosis Date  . Alcohol dependence ([place])   . Anxiety   . Breast cancer ([place])   . CHF (congestive heart failure) ([place])   . Cigarette nicotine dependence   . Colon polyps   . COPD (chronic obstructive pulmonary disease) ([place])   . Diabetes mellitus without complication ([place])   . Diabetic neuropathy ([place])   . Elevated lipase   . Hyperlipemia   . Hypertension   . Insomnia   . Lymphedema   . Marijuana use   . OSA treated with BiPAP   . PAD (peripheral artery disease) ([place])   . Ulcer of foot ([place])    right     Past Surgical History:  Procedure Laterality Date  . ABDOMINAL HYSTERECTOMY    . COLONOSCOPY     [facility] hospital  . MASTECTOMY Right April 2016   Family History  Problem Relation Age of Onset  . Diabetes Other   . Heart disease Other   . Breast cancer Maternal Grandmother   . Breast cancer Paternal Grandmother   . Stroke Son    Social History   Tobacco Use  . Smoking status: Former Smoker    Quit date: 03/24/2015    Years since quitting: 3.8  . Smokeless tobacco: Never Used  Substance Use Topics  . Alcohol use: Yes    Comment: Beer - "on the weekends hanging out, maybe 2 or 3"   . Drug use: Yes    Types: Marijuana    Comment: last used 8 2020   Current Outpatient Medications  Medication Sig Dispense Refill  . albuterol (PROAIR HFA) 108 (90 Base) MCG/ACT inhaler Inhale 2 puffs into the lungs every  6 (six) hours as needed.    Marland Kitchen amitriptyline (ELAVIL) 10 MG tablet Take 10 mg by mouth at bedtime.    Marland Kitchen atorvastatin (LIPITOR) 40 MG tablet Take 40 mg by mouth daily.    . budesonide-formoterol (SYMBICORT) 160-4.5 MCG/ACT inhaler Inhale 2 puffs into the lungs 2 (two) times daily.    . chlorpheniramine-HYDROcodone (TUSSIONEX PENNKINETIC ER) 10-8 MG/5ML SUER Take 5 mLs by mouth every 12 (twelve) hours as needed for cough. 140 mL 0  . cilostazol (PLETAL) 100 MG tablet Take 100 mg  by mouth 2 (two) times daily.    . clopidogrel (PLAVIX) 75 MG tablet Take 75 mg by mouth daily.    . cyclobenzaprine (FLEXERIL) 5 MG tablet Take 1 tablet by mouth at bedtime.    Marland Kitchen diltiazem (DILACOR XR) 120 MG 24 hr capsule Take 120 mg by mouth daily.    . furosemide (LASIX) 40 MG tablet Take 40 mg by mouth daily.    Marland Kitchen gabapentin (NEURONTIN) 800 MG tablet Take 800 mg by mouth 3 (three) times daily.    Marland Kitchen glimepiride (AMARYL) 4 MG tablet Take 4 mg by mouth daily with breakfast.    . HYDROcodone-acetaminophen (NORCO/VICODIN) 5-325 MG tablet Take 1 tablet by mouth every 6 (six) hours as needed for moderate pain.    Marland Kitchen insulin glargine (LANTUS) 100 UNIT/ML injection Inject 80 Units into the skin daily.    . insulin lispro (HUMALOG) 100 UNIT/ML injection Inject 14 Units into the skin 3 (three) times daily before meals.    . mirabegron ER (MYRBETRIQ) 50 MG TB24 tablet Take 50 mg by mouth daily.    . ondansetron (ZOFRAN) 4 MG tablet Take 4 mg by mouth every 8 (eight) hours as needed for nausea or vomiting.    . potassium chloride (K-DUR) 10 MEQ tablet Take 10 mEq by mouth daily.    . promethazine (PHENERGAN) 25 MG tablet Take 25 mg by mouth every 6 (six) hours as needed for nausea or vomiting.     No current facility-administered medications for this visit.    Allergies  Allergen Reactions  . Aspirin Other (See Comments)    "Makes my pancreas act up"      Review of Systems: Positive for fatigue, excessive urination . All  other systems reviewed and negative except where noted in HPI.    Physical Exam:    Wt Readings from Last 3 Encounters:  01/14/19 203 lb (92.1 kg)  01/08/19 202 lb 1.6 oz (91.7 kg)  04/08/15 198 lb (89.8 kg)    BP 134/82   Pulse (!) 103   Temp 97.6 F (36.4 C)   Ht 5\' 6"  (1.676 m)   Wt 203 lb (92.1 kg)   BMI 32.77 kg/m  Constitutional:  Pleasant female with frequent crying spells. EENT: Pupils normal.  Conjunctivae are normal. No scleral icterus. Neck supple.  Cardiovascular: Normal rate, regular rhythm. No edema Pulmonary/chest: Effort normal and breath sounds normal. No wheezing, rales or rhonchi. Abdominal: Soft, protuberant. Bowel sounds active throughout. There are no masses palpable. No hepatomegaly. Neurological: Alert and oriented to person place and time. Skin: Skin is warm and dry. No rashes noted.  Tye Savoy, NP  01/14/2019, 11:03 AM  Cc: Sonia Side., FNP

## 2019-01-14 NOTE — Patient Instructions (Signed)
If you are age 57 or older, your body mass index should be between 23-30. Your Body mass index is 32.77 kg/m. If this is out of the aforementioned range listed, please consider follow up with your Primary Care Provider.  If you are age 5 or younger, your body mass index should be between 19-25. Your Body mass index is 32.77 kg/m. If this is out of the aformentioned range listed, please consider follow up with your Primary Care Provider.   We have sent the following medications to your pharmacy for you to pick up at your convenience: Creon  You have been scheduled for a CT scan of the abdomen and pelvis at [place] are scheduled on 01/20/19 at 7 am. You should arrive 15 minutes prior to your appointment time for registration. Please follow the written instructions below on the day of your exam:  WARNING: IF YOU ARE ALLERGIC TO IODINE/X-RAY DYE, PLEASE NOTIFY RADIOLOGY IMMEDIATELY AT [phone]! YOU WILL BE GIVEN A 13 HOUR PREMEDICATION PREP.  1) Do not eat or drink anything after midnight the night before your test. 2) You have been given 2 bottles of oral contrast to drink. The solution may taste better if refrigerated, but do NOT add ice or any other liquid to this solution. Shake well before drinking.    Drink 1 bottle of contrast @ 5 am (2 hours prior to your exam)  Drink 1 bottle of contrast @ 6 am (1 hour prior to your exam)  You may take any medications as prescribed with a small amount of water, if necessary. If you take any of the following medications: METFORMIN, GLUCOPHAGE, GLUCOVANCE, AVANDAMET, RIOMET, FORTAMET, [place] MET, JANUMET, GLUMETZA or METAGLIP, you MAY be asked to HOLD this medication 48 hours AFTER the exam.  The purpose of you drinking the oral contrast is to aid in the visualization of your intestinal tract. The contrast solution may cause some diarrhea. Depending on your individual set of symptoms, you may also receive an intravenous injection of  x-ray contrast/dye. Plan on being at [facility] for 30 minutes or longer, depending on the type of exam you are having performed.  This test typically takes 30-45 minutes to complete.  If you have any questions regarding your exam or if you need to reschedule, you may call the CT department at [phone] between the hours of 8:00 am and 5:00 pm, Monday-Friday.  _____________________________________________________________________  We are requesting records from [facility].  Follow up appointment with Dr. Havery Moros on February 19, 2019 at 10:30 am.  Thank you for choosing me and [facility].   Tye Savoy, NP

## 2019-01-15 NOTE — Progress Notes (Signed)
Agree with assessment and plan as outlined.  

## 2019-01-20 ENCOUNTER — Other Ambulatory Visit: Payer: Self-pay

## 2019-01-20 ENCOUNTER — Encounter (HOSPITAL_COMMUNITY): Payer: Self-pay

## 2019-01-20 ENCOUNTER — Ambulatory Visit (HOSPITAL_COMMUNITY)
Admission: RE | Admit: 2019-01-20 | Discharge: 2019-01-20 | Disposition: A | Discharge status: Home / Self Care | Payer: Medicare Other | Source: Ambulatory Visit | Attending: Nurse Practitioner | Admitting: Nurse Practitioner

## 2019-01-20 DIAGNOSIS — K861 Other chronic pancreatitis: Secondary | ICD-10-CM | POA: Insufficient documentation

## 2019-01-20 DIAGNOSIS — G8929 Other chronic pain: Secondary | ICD-10-CM | POA: Diagnosis present

## 2019-01-22 ENCOUNTER — Ambulatory Visit
Admission: RE | Admit: 2019-01-22 | Discharge: 2019-01-22 | Disposition: A | Discharge status: Home / Self Care | Payer: Medicare Other | Source: Ambulatory Visit | Attending: Hematology and Oncology | Admitting: Hematology and Oncology

## 2019-01-22 ENCOUNTER — Other Ambulatory Visit: Payer: Self-pay

## 2019-01-22 DIAGNOSIS — Z1231 Encounter for screening mammogram for malignant neoplasm of breast: Secondary | ICD-10-CM

## 2019-01-27 ENCOUNTER — Inpatient Hospital Stay: Payer: Medicare Other | Admitting: Hematology and Oncology

## 2019-01-27 NOTE — Assessment & Plan Note (Deleted)
06/15/2014: Screening mammogram detected right breast IDC grade 2, ER 90%, PR 60%, HER-2 negative, status post initial lumpectomy followed by mastectomy, final size 3 to 4 cm 0/3 lymph nodes negative with multifocal DCIS on mastectomy specimen. Oncotype DX score 17: Distant recurrence rate 11% Could not tolerate antiestrogen therapy  Breast cancer surveillance: Left breast mammogram 12/20/2018: Benign, ultrasound right reconstructed breasts normal. Severe left breast pain: Unclear etiology.  Mammogram ultrasound did not show any cause. Breast MRI has not been done

## 2019-01-29 NOTE — Progress Notes (Signed)
[facility] called to schedule for MRI.  [facility] will call patient to get MRI set up.    RN attempted to notify patient, no voicemail and no answer.

## 2019-01-30 ENCOUNTER — Telehealth: Payer: Self-pay

## 2019-01-30 NOTE — Telephone Encounter (Signed)
RN spoke with patient regarding setting up breast MRI.   Pt will be contact [place], contact information given to patient.

## 2019-02-19 ENCOUNTER — Ambulatory Visit (INDEPENDENT_AMBULATORY_CARE_PROVIDER_SITE_OTHER): Payer: Medicare Other | Admitting: Gastroenterology

## 2019-02-19 ENCOUNTER — Encounter: Payer: Self-pay | Admitting: Gastroenterology

## 2019-02-19 ENCOUNTER — Telehealth: Payer: Self-pay

## 2019-02-19 ENCOUNTER — Other Ambulatory Visit: Payer: Self-pay

## 2019-02-19 VITALS — BP 126/80 | HR 100 | Temp 98.6°F | Ht 65.5 in | Wt 208.5 lb

## 2019-02-19 DIAGNOSIS — K861 Other chronic pancreatitis: Secondary | ICD-10-CM

## 2019-02-19 DIAGNOSIS — G894 Chronic pain syndrome: Secondary | ICD-10-CM | POA: Diagnosis not present

## 2019-02-19 DIAGNOSIS — F17200 Nicotine dependence, unspecified, uncomplicated: Secondary | ICD-10-CM

## 2019-02-19 MED ORDER — HYDROCODONE-ACETAMINOPHEN 5-325 MG PO TABS: 1.0000 | ORAL_TABLET | Freq: Four times a day (QID) | ORAL | 0 refills | Status: AC | PRN

## 2019-02-19 MED ORDER — AMITRIPTYLINE HCL 10 MG PO TABS: 50.0000 mg | ORAL_TABLET | Freq: Every day | ORAL | 3 refills | Status: DC

## 2019-02-19 NOTE — Patient Instructions (Addendum)
If you are age 57 or older, your body mass index should be between 23-30. Your Body mass index is 34.17 kg/m. If this is out of the aforementioned range listed, please consider follow up with your Primary Care Provider.  If you are age 22 or younger, your body mass index should be between 19-25. Your Body mass index is 34.17 kg/m. If this is out of the aformentioned range listed, please consider follow up with your Primary Care Provider.   To help prevent the possible spread of infection to our patients, communities, and staff; we will be implementing the following measures:  As of now we are not allowing any visitors/family members to accompany you to any upcoming appointments with [facility]. If you have any concerns about this please contact our office to discuss prior to the appointment.   Increase your Elavil to 50mg  every evening at bedtime.  Continue your Creon.  We will refer you to pain management.  They will call you to schedule an appointment.   We will request your lab results from Dr. Tamala Julian.  We will request your colonoscopy and EGD records. Please provide the name and phone number from the facility where you had those done: [phone]. Thank you.  Thank you for entrusting me with your care and for choosing [facility], Dr. Knott Cellar

## 2019-02-19 NOTE — Progress Notes (Signed)
HPI :  57 year old female here for a follow-up visit.  She is new to me, previously seen by Tye Savoy.   She has multiple medical problems to include history of alcohol abuse, chronic calcific pancreatitis, COPD, diabetes, chronic pain syndrome, PVD on chronic Plavix.  I have reviewed her chart and prior CT scans.  She has had multiple CT scans at [facility] in the past few years showing scattered pancreatic calcifications concerning for chronic pancreatitis.  She has had prior ultrasound showing normal gallbladder and a HIDA scan in 2018 which was normal.  She was been advised to discontinue alcohol in the past she was placed on Creon.  She states the Creon helped initially but she ran out of it and symptoms got worse.  She has relocated to move from [place] to [place].  She has been followed by pain management Adrian Blackwater as she is managed with chronic narcotics for her pain, but has not yet established with a pain management physician locally.  She has been on Cymbalta in the past which did not help.  She has been on placed on gabapentin which does provide significant help for her pain and neuropathy.  Since her last visit she was placed back on the Creon, taking 2 capsules with meals and 1 with snacks, she states this is provided some benefit for her and she is eating much better with this.  She continues to have chronic pain in her epigastric area which she states is present all the time and never goes away.  She is generally eating okay and denies any weight loss.  Her bowels are moving normally.  She has some occasional bloating.  Her regimen includes hydrocodone 3 times a day, gabapentin 800 mg 3 times daily, Creon, and Elavil use at night.  She is currently taking 20 mg nightly and states Elavil does help her sleep and she likes it so far.  She had a noncontrast CT scan of the abdomen and pelvis since her last visit with [place], the pancreas actually appeared normal as did the rest of her  abdomen aside of vascular calcifications.  Very generally she is improved on the Creon since her last visit here however states her chronic pain has been persistent and she is hoping to get plugged in with pain management again.  She has not been using any alcohol at all since her last visit doing a good job with this.  She previously had quit tobacco however has since resumed about 5 cigarettes a day.  She has had a colonoscopy and upper endoscopy within the past 2 to 3 years.  We previously requested records from the clinic in [place] where that was done, but it did not arrive.  She is requesting a refill for her narcotics today.  Prior imaging: CT scan 01/20/19 - normal gallbladder, normal pancreas noncontrast, vascular calcifications CT scan 09/08/2016 - pancreatic calcifications suggestive of chronic calcific pancreatitis ([facility]) CT scan 05/04/2016 - pancreatic calcifications suggestive of sequelae of prior pancreatitis   Past Medical History:  Diagnosis Date  . Alcohol dependence ([place])   . Anxiety   . Breast cancer ([place])   . CHF (congestive heart failure) ([place])   . Cigarette nicotine dependence   . Colon polyps   . COPD (chronic obstructive pulmonary disease) ([place])   . Diabetes mellitus without complication ([place])   . Diabetic neuropathy ([place])   . Elevated lipase   . Hyperlipemia   . Hypertension   . Insomnia   . Lymphedema   .  Marijuana use   . OSA treated with BiPAP   . PAD (peripheral artery disease) ([place])   . Ulcer of foot ([place])    right     Past Surgical History:  Procedure Laterality Date  . ABDOMINAL HYSTERECTOMY    . COLONOSCOPY     [facility] hospital  . MASTECTOMY Right April 2016   Family History  Problem Relation Age of Onset  . Diabetes Other   . Heart disease Other   . Breast cancer Maternal Grandmother   . Breast cancer Paternal Grandmother   . Stroke Son    Social History   Tobacco Use  . Smoking status: Former Smoker    Quit date:  03/24/2015    Years since quitting: 3.9  . Smokeless tobacco: Never Used  Substance Use Topics  . Alcohol use: Yes    Comment: Beer - "on the weekends hanging out, maybe 2 or 3"   . Drug use: Yes    Types: Marijuana    Comment: last used 8 2020   Current Outpatient Medications  Medication Sig Dispense Refill  . albuterol (PROAIR HFA) 108 (90 Base) MCG/ACT inhaler Inhale 2 puffs into the lungs every 6 (six) hours as needed.    Marland Kitchen amitriptyline (ELAVIL) 10 MG tablet Take 10 mg by mouth at bedtime.    Marland Kitchen atorvastatin (LIPITOR) 40 MG tablet Take 40 mg by mouth daily.    . budesonide-formoterol (SYMBICORT) 160-4.5 MCG/ACT inhaler Inhale 2 puffs into the lungs 2 (two) times daily.    . chlorpheniramine-HYDROcodone (TUSSIONEX PENNKINETIC ER) 10-8 MG/5ML SUER Take 5 mLs by mouth every 12 (twelve) hours as needed for cough. 140 mL 0  . cilostazol (PLETAL) 100 MG tablet Take 100 mg by mouth 2 (two) times daily.    . clopidogrel (PLAVIX) 75 MG tablet Take 75 mg by mouth daily.    . cyclobenzaprine (FLEXERIL) 5 MG tablet Take 1 tablet by mouth at bedtime.    Marland Kitchen diltiazem (DILACOR XR) 120 MG 24 hr capsule Take 120 mg by mouth daily.    . furosemide (LASIX) 40 MG tablet Take 40 mg by mouth daily.    Marland Kitchen gabapentin (NEURONTIN) 800 MG tablet Take 800 mg by mouth 3 (three) times daily.    Marland Kitchen glimepiride (AMARYL) 4 MG tablet Take 4 mg by mouth daily with breakfast.    . HYDROcodone-acetaminophen (NORCO/VICODIN) 5-325 MG tablet Take 1 tablet by mouth every 6 (six) hours as needed for moderate pain.    Marland Kitchen insulin glargine (LANTUS) 100 UNIT/ML injection Inject 80 Units into the skin daily.    . insulin lispro (HUMALOG) 100 UNIT/ML injection Inject 14 Units into the skin 3 (three) times daily before meals.    . lipase/protease/amylase (CREON) 36000 UNITS CPEP capsule Take 2 caps p.o. during each meal and 1 cap p.o. during each snack 240 capsule 5  . mirabegron ER (MYRBETRIQ) 50 MG TB24 tablet Take 50 mg by mouth  daily.    . ondansetron (ZOFRAN) 4 MG tablet Take 4 mg by mouth every 8 (eight) hours as needed for nausea or vomiting.    . potassium chloride (K-DUR) 10 MEQ tablet Take 10 mEq by mouth daily.    . promethazine (PHENERGAN) 25 MG tablet Take 25 mg by mouth every 6 (six) hours as needed for nausea or vomiting.     No current facility-administered medications for this visit.    Allergies  Allergen Reactions  . Aspirin Other (See Comments)    "Makes my pancreas  act up"      Review of Systems: All systems reviewed and negative except where noted in HPI.   Labs in care everywhere   Physical Exam: Temp 98.6 F (37 C)   Ht 5' 5.5" (1.664 m) Comment: height measured without shoes  Wt 208 lb 8 oz (94.6 kg)   BMI 34.17 kg/m  Constitutional: Pleasant,well-developed, female in no acute distress. HEENT: Normocephalic and atraumatic. Conjunctivae are normal. No scleral icterus. Neck supple.  Cardiovascular: Normal rate, regular rhythm.  Pulmonary/chest: Effort normal and breath sounds normal. No wheezing, rales or rhonchi. Abdominal: Soft, nondistended, diffusely tender in the epigastric and right mid abdomen, no peritoneal signs. There are no masses palpable.  Extremities: no edema Lymphadenopathy: No cervical adenopathy noted. Neurological: Alert and oriented to person place and time. Skin: Skin is warm and dry. No rashes noted. Psychiatric: Normal mood and affect. Behavior is normal.   ASSESSMENT AND PLAN: 58 year old female here for reassessment of the following issues:  Chronic pancreatitis / chronic pain syndrome / chronic tobacco use - while her most recent CT scan appears okay, she does have multiple CT scans in the past showing changes concerning for chronic pancreatitis.  Her symptoms are consistent with chronic pancreatitis.  We discussed what chronic pancreatitis is, I suspect this is related to her history of alcohol abuse in the past, she has done a good job of completely  stopping alcohol.  We did discuss for a while about her chronic tobacco use, and this increases her risk for progression of chronic pancreatitis, and recommend she completely abstain from tobacco if she can.  She has had a nice response to Creon since it has been reintroduced to her, and we will continue this, generally she is better than the last time we saw her.  She has been managed with chronic narcotics by a pain management clinic in [place], I will refer her to a local pain management group to manage her narcotics.  I explained to her that our office does not prescribe long-term narcotics, and she will need to get this from pain management or primary care.  I gave her a short refill to get her through until her next appointment.  She will continue the gabapentin, and I recommend we try increase of the Elavil from 20 mg nightly to 50 mg nightly, as this may help her pain as well.  We are still waiting on the report of her EGD and colonoscopy from recent years, will place a request for that again.  I will also request her most recent labs from her primary care's office to ensure stable.  I will see her again in 6 months or sooner with any issues.  She agreed  Sheridan Cellar, MD [facility]

## 2019-02-19 NOTE — Telephone Encounter (Signed)
Faxed referral to [facility] in [place].  Fax: [phone]. Their number is [phone].  They do accept Medicare insurance.  Inez Catalina in referrals called and needs to know who the pt has been seeing in [facility] for pain management and for how long and when was the last time she saw them.   LM for pt to call me back with that information.  Betty[facility]: Choose option 5.

## 2019-02-19 NOTE — Telephone Encounter (Signed)
Pt returned your call, pls call her again.  °

## 2019-02-19 NOTE — Telephone Encounter (Signed)
Called pt back and LM to return call.

## 2019-02-21 ENCOUNTER — Telehealth: Payer: Self-pay | Admitting: Gastroenterology

## 2019-02-21 NOTE — Telephone Encounter (Signed)
Results of prior colonoscopy obtained as outlined:   12/31/2015 - Dr. Steward Drone - "fair prep" , diminutive transverse polyp removed c/w adenoma.  Sherlynn Stalls can you please let the patient know we obtained prior records, her prep was only fair on the last exam > 3 years ago, I think she is in need of a surveillance colonoscopy at her convenience, if you can help schedule, with double prep. Thanks

## 2019-02-21 NOTE — Telephone Encounter (Signed)
Left message on machine to call back  

## 2019-02-21 NOTE — Telephone Encounter (Signed)
Called and LM for pt to call back to provide information requested from [facility] in order to proceed with Referral.  I let the patient know that without this information I will not be able to proceed with placing the referral.

## 2019-02-21 NOTE — Telephone Encounter (Signed)
Pt called back and said her Pain Management Doctor is Dr. Buena [facility] its been 19yrs

## 2019-02-24 ENCOUNTER — Inpatient Hospital Stay (HOSPITAL_COMMUNITY)
Admission: EM | Admit: 2019-02-24 | Discharge: 2019-02-26 | DRG: 192 | Disposition: A | Discharge status: Home / Self Care | Payer: Medicare Other | Attending: Internal Medicine | Admitting: Internal Medicine

## 2019-02-24 ENCOUNTER — Encounter (HOSPITAL_COMMUNITY): Payer: Self-pay | Admitting: Emergency Medicine

## 2019-02-24 ENCOUNTER — Other Ambulatory Visit: Payer: Self-pay

## 2019-02-24 ENCOUNTER — Emergency Department (HOSPITAL_COMMUNITY): Payer: Medicare Other

## 2019-02-24 DIAGNOSIS — Z8719 Personal history of other diseases of the digestive system: Secondary | ICD-10-CM

## 2019-02-24 DIAGNOSIS — Z90710 Acquired absence of both cervix and uterus: Secondary | ICD-10-CM

## 2019-02-24 DIAGNOSIS — F419 Anxiety disorder, unspecified: Secondary | ICD-10-CM | POA: Diagnosis present

## 2019-02-24 DIAGNOSIS — R590 Localized enlarged lymph nodes: Secondary | ICD-10-CM | POA: Diagnosis present

## 2019-02-24 DIAGNOSIS — Z833 Family history of diabetes mellitus: Secondary | ICD-10-CM

## 2019-02-24 DIAGNOSIS — Z9011 Acquired absence of right breast and nipple: Secondary | ICD-10-CM

## 2019-02-24 DIAGNOSIS — E1165 Type 2 diabetes mellitus with hyperglycemia: Secondary | ICD-10-CM

## 2019-02-24 DIAGNOSIS — J209 Acute bronchitis, unspecified: Secondary | ICD-10-CM | POA: Diagnosis not present

## 2019-02-24 DIAGNOSIS — G4733 Obstructive sleep apnea (adult) (pediatric): Secondary | ICD-10-CM | POA: Diagnosis present

## 2019-02-24 DIAGNOSIS — E785 Hyperlipidemia, unspecified: Secondary | ICD-10-CM | POA: Diagnosis present

## 2019-02-24 DIAGNOSIS — Z6832 Body mass index (BMI) 32.0-32.9, adult: Secondary | ICD-10-CM

## 2019-02-24 DIAGNOSIS — Z170 Estrogen receptor positive status [ER+]: Secondary | ICD-10-CM

## 2019-02-24 DIAGNOSIS — R0902 Hypoxemia: Secondary | ICD-10-CM | POA: Diagnosis present

## 2019-02-24 DIAGNOSIS — Z853 Personal history of malignant neoplasm of breast: Secondary | ICD-10-CM

## 2019-02-24 DIAGNOSIS — Z79899 Other long term (current) drug therapy: Secondary | ICD-10-CM

## 2019-02-24 DIAGNOSIS — J40 Bronchitis, not specified as acute or chronic: Secondary | ICD-10-CM | POA: Insufficient documentation

## 2019-02-24 DIAGNOSIS — E1140 Type 2 diabetes mellitus with diabetic neuropathy, unspecified: Secondary | ICD-10-CM | POA: Diagnosis present

## 2019-02-24 DIAGNOSIS — F17210 Nicotine dependence, cigarettes, uncomplicated: Secondary | ICD-10-CM | POA: Diagnosis present

## 2019-02-24 DIAGNOSIS — I10 Essential (primary) hypertension: Secondary | ICD-10-CM | POA: Diagnosis present

## 2019-02-24 DIAGNOSIS — R778 Other specified abnormalities of plasma proteins: Secondary | ICD-10-CM | POA: Diagnosis present

## 2019-02-24 DIAGNOSIS — E1151 Type 2 diabetes mellitus with diabetic peripheral angiopathy without gangrene: Secondary | ICD-10-CM | POA: Diagnosis present

## 2019-02-24 DIAGNOSIS — E119 Type 2 diabetes mellitus without complications: Secondary | ICD-10-CM

## 2019-02-24 DIAGNOSIS — J440 Chronic obstructive pulmonary disease with acute lower respiratory infection: Principal | ICD-10-CM | POA: Diagnosis present

## 2019-02-24 DIAGNOSIS — Z7951 Long term (current) use of inhaled steroids: Secondary | ICD-10-CM

## 2019-02-24 DIAGNOSIS — Z7902 Long term (current) use of antithrombotics/antiplatelets: Secondary | ICD-10-CM

## 2019-02-24 DIAGNOSIS — Z886 Allergy status to analgesic agent status: Secondary | ICD-10-CM

## 2019-02-24 DIAGNOSIS — Z20828 Contact with and (suspected) exposure to other viral communicable diseases: Secondary | ICD-10-CM | POA: Diagnosis present

## 2019-02-24 DIAGNOSIS — J449 Chronic obstructive pulmonary disease, unspecified: Secondary | ICD-10-CM

## 2019-02-24 DIAGNOSIS — G4700 Insomnia, unspecified: Secondary | ICD-10-CM | POA: Diagnosis present

## 2019-02-24 DIAGNOSIS — G629 Polyneuropathy, unspecified: Secondary | ICD-10-CM

## 2019-02-24 DIAGNOSIS — Z803 Family history of malignant neoplasm of breast: Secondary | ICD-10-CM

## 2019-02-24 DIAGNOSIS — IMO0002 Reserved for concepts with insufficient information to code with codable children: Secondary | ICD-10-CM

## 2019-02-24 DIAGNOSIS — F1290 Cannabis use, unspecified, uncomplicated: Secondary | ICD-10-CM | POA: Diagnosis present

## 2019-02-24 DIAGNOSIS — E669 Obesity, unspecified: Secondary | ICD-10-CM | POA: Diagnosis present

## 2019-02-24 DIAGNOSIS — Z794 Long term (current) use of insulin: Secondary | ICD-10-CM

## 2019-02-24 LAB — BASIC METABOLIC PANEL
Anion gap: 14 (ref 5–15)
BUN: 17 mg/dL (ref 6–20)
CO2: 21 mmol/L — ABNORMAL LOW (ref 22–32)
Calcium: 8.7 mg/dL — ABNORMAL LOW (ref 8.9–10.3)
Chloride: 100 mmol/L (ref 98–111)
Creatinine, Ser: 1.16 mg/dL — ABNORMAL HIGH (ref 0.44–1.00)
GFR calc Af Amer: >60 mL/min (ref >60)
GFR calc non Af Amer: 52 mL/min — ABNORMAL LOW (ref >60)
Glucose, Bld: 260 mg/dL — ABNORMAL HIGH (ref 70–99)
Potassium: 3.6 mmol/L (ref 3.5–5.1)
Sodium: 135 mmol/L (ref 135–145)

## 2019-02-24 LAB — I-STAT BETA HCG BLOOD, ED (MC, WL, AP ONLY): I-stat hCG, quantitative: <5 m[IU]/mL (ref <5)

## 2019-02-24 LAB — CBC
HCT: 39.9 % (ref 36.0–46.0)
Hemoglobin: 13.5 g/dL (ref 12.0–15.0)
MCH: 31.4 pg (ref 26.0–34.0)
MCHC: 33.8 g/dL (ref 30.0–36.0)
MCV: 92.8 fL (ref 80.0–100.0)
Platelets: 285 10*3/uL (ref 150–400)
RBC: 4.3 MIL/uL (ref 3.87–5.11)
RDW: 14 % (ref 11.5–15.5)
WBC: 11.5 10*3/uL — ABNORMAL HIGH (ref 4.0–10.5)
nRBC: 0 % (ref 0.0–0.2)

## 2019-02-24 LAB — TROPONIN I (HIGH SENSITIVITY): Troponin I (High Sensitivity): 87 ng/L — ABNORMAL HIGH (ref <18)

## 2019-02-24 MED ORDER — SODIUM CHLORIDE 0.9% FLUSH: 3.0000 mL | Freq: Once | INTRAVENOUS | Status: DC

## 2019-02-24 NOTE — Telephone Encounter (Signed)
Called [facility] and Left detailed message for Inez Catalina with additional information from Pt as noted in this call's documentation.  Asked Inez Catalina to call us back if she needed anything further to process the referral for the patient.

## 2019-02-24 NOTE — ED Triage Notes (Signed)
Patient reports central chest pain with SOB , chest congestion and productive cough onset yesterday , denies fever or chills , no emesis or diaphoresis , history of CHF/COPD.

## 2019-02-24 NOTE — Telephone Encounter (Signed)
Left message again to please call back.

## 2019-02-25 ENCOUNTER — Emergency Department (HOSPITAL_COMMUNITY): Payer: Medicare Other

## 2019-02-25 ENCOUNTER — Other Ambulatory Visit: Payer: Medicare Other

## 2019-02-25 DIAGNOSIS — Z90710 Acquired absence of both cervix and uterus: Secondary | ICD-10-CM | POA: Diagnosis not present

## 2019-02-25 DIAGNOSIS — I10 Essential (primary) hypertension: Secondary | ICD-10-CM | POA: Diagnosis present

## 2019-02-25 DIAGNOSIS — J069 Acute upper respiratory infection, unspecified: Secondary | ICD-10-CM | POA: Diagnosis not present

## 2019-02-25 DIAGNOSIS — Z7951 Long term (current) use of inhaled steroids: Secondary | ICD-10-CM | POA: Diagnosis not present

## 2019-02-25 DIAGNOSIS — Z20828 Contact with and (suspected) exposure to other viral communicable diseases: Secondary | ICD-10-CM | POA: Diagnosis present

## 2019-02-25 DIAGNOSIS — Z9011 Acquired absence of right breast and nipple: Secondary | ICD-10-CM | POA: Diagnosis not present

## 2019-02-25 DIAGNOSIS — J449 Chronic obstructive pulmonary disease, unspecified: Secondary | ICD-10-CM | POA: Diagnosis not present

## 2019-02-25 DIAGNOSIS — J440 Chronic obstructive pulmonary disease with acute lower respiratory infection: Secondary | ICD-10-CM | POA: Diagnosis present

## 2019-02-25 DIAGNOSIS — G4733 Obstructive sleep apnea (adult) (pediatric): Secondary | ICD-10-CM | POA: Diagnosis present

## 2019-02-25 DIAGNOSIS — Z853 Personal history of malignant neoplasm of breast: Secondary | ICD-10-CM | POA: Diagnosis not present

## 2019-02-25 DIAGNOSIS — B9789 Other viral agents as the cause of diseases classified elsewhere: Secondary | ICD-10-CM | POA: Diagnosis not present

## 2019-02-25 DIAGNOSIS — E119 Type 2 diabetes mellitus without complications: Secondary | ICD-10-CM

## 2019-02-25 DIAGNOSIS — F419 Anxiety disorder, unspecified: Secondary | ICD-10-CM | POA: Diagnosis present

## 2019-02-25 DIAGNOSIS — R778 Other specified abnormalities of plasma proteins: Secondary | ICD-10-CM | POA: Diagnosis present

## 2019-02-25 DIAGNOSIS — E669 Obesity, unspecified: Secondary | ICD-10-CM | POA: Diagnosis present

## 2019-02-25 DIAGNOSIS — E1140 Type 2 diabetes mellitus with diabetic neuropathy, unspecified: Secondary | ICD-10-CM | POA: Diagnosis present

## 2019-02-25 DIAGNOSIS — Z8719 Personal history of other diseases of the digestive system: Secondary | ICD-10-CM | POA: Diagnosis not present

## 2019-02-25 DIAGNOSIS — J40 Bronchitis, not specified as acute or chronic: Secondary | ICD-10-CM | POA: Insufficient documentation

## 2019-02-25 DIAGNOSIS — E1165 Type 2 diabetes mellitus with hyperglycemia: Secondary | ICD-10-CM

## 2019-02-25 DIAGNOSIS — F17210 Nicotine dependence, cigarettes, uncomplicated: Secondary | ICD-10-CM | POA: Diagnosis present

## 2019-02-25 DIAGNOSIS — E785 Hyperlipidemia, unspecified: Secondary | ICD-10-CM | POA: Diagnosis present

## 2019-02-25 DIAGNOSIS — E1151 Type 2 diabetes mellitus with diabetic peripheral angiopathy without gangrene: Secondary | ICD-10-CM | POA: Diagnosis present

## 2019-02-25 DIAGNOSIS — G629 Polyneuropathy, unspecified: Secondary | ICD-10-CM

## 2019-02-25 DIAGNOSIS — G4700 Insomnia, unspecified: Secondary | ICD-10-CM | POA: Diagnosis present

## 2019-02-25 DIAGNOSIS — IMO0002 Reserved for concepts with insufficient information to code with codable children: Secondary | ICD-10-CM

## 2019-02-25 DIAGNOSIS — Z170 Estrogen receptor positive status [ER+]: Secondary | ICD-10-CM | POA: Diagnosis not present

## 2019-02-25 DIAGNOSIS — Z79899 Other long term (current) drug therapy: Secondary | ICD-10-CM | POA: Diagnosis not present

## 2019-02-25 DIAGNOSIS — J209 Acute bronchitis, unspecified: Secondary | ICD-10-CM | POA: Diagnosis present

## 2019-02-25 DIAGNOSIS — F1290 Cannabis use, unspecified, uncomplicated: Secondary | ICD-10-CM | POA: Diagnosis present

## 2019-02-25 DIAGNOSIS — Z794 Long term (current) use of insulin: Secondary | ICD-10-CM | POA: Diagnosis not present

## 2019-02-25 DIAGNOSIS — R0902 Hypoxemia: Secondary | ICD-10-CM | POA: Diagnosis present

## 2019-02-25 DIAGNOSIS — Z7902 Long term (current) use of antithrombotics/antiplatelets: Secondary | ICD-10-CM | POA: Diagnosis not present

## 2019-02-25 LAB — RESPIRATORY PANEL BY PCR

## 2019-02-25 LAB — CBG MONITORING, ED
Glucose-Capillary: 226 mg/dL — ABNORMAL HIGH (ref 70–99)
Glucose-Capillary: 373 mg/dL — ABNORMAL HIGH (ref 70–99)
Glucose-Capillary: 547 mg/dL (ref 70–99)

## 2019-02-25 LAB — TROPONIN I (HIGH SENSITIVITY)
Troponin I (High Sensitivity): 93 ng/L — ABNORMAL HIGH (ref <18)
Troponin I (High Sensitivity): 125 ng/L (ref <18)
Troponin I (High Sensitivity): 112 ng/L (ref <18)

## 2019-02-25 LAB — HEMOGLOBIN A1C
Hgb A1c MFr Bld: 10.8 % — ABNORMAL HIGH (ref 4.8–5.6)
Mean Plasma Glucose: 263.26 mg/dL

## 2019-02-25 LAB — HIV ANTIBODY (ROUTINE TESTING W REFLEX): HIV Screen 4th Generation wRfx: NONREACTIVE

## 2019-02-25 LAB — BRAIN NATRIURETIC PEPTIDE: B Natriuretic Peptide: 152.6 pg/mL — ABNORMAL HIGH (ref 0.0–100.0)

## 2019-02-25 LAB — SARS CORONAVIRUS 2 (TAT 6-24 HRS): SARS Coronavirus 2: NEGATIVE

## 2019-02-25 MED ORDER — AMITRIPTYLINE HCL 50 MG PO TABS
50.0000 mg | ORAL_TABLET | Freq: Every day | ORAL | Status: DC
  Administered 2019-02-25: 50 mg via ORAL
  Filled 2019-02-25: qty 2
  Filled 2019-02-25: qty 1

## 2019-02-25 MED ORDER — ACETAMINOPHEN 650 MG RE SUPP: 650.0000 mg | Freq: Four times a day (QID) | RECTAL | Status: DC | PRN

## 2019-02-25 MED ORDER — SODIUM CHLORIDE 0.9 % IV SOLN
500.0000 mg | Freq: Once | INTRAVENOUS | Status: AC
  Administered 2019-02-25: 500 mg via INTRAVENOUS
  Filled 2019-02-25: qty 500

## 2019-02-25 MED ORDER — LOSARTAN POTASSIUM 50 MG PO TABS
50.0000 mg | ORAL_TABLET | Freq: Every day | ORAL | Status: DC
  Administered 2019-02-26: 50 mg via ORAL
  Filled 2019-02-25: qty 1

## 2019-02-25 MED ORDER — MOMETASONE FURO-FORMOTEROL FUM 100-5 MCG/ACT IN AERO
2.0000 | INHALATION_SPRAY | Freq: Two times a day (BID) | RESPIRATORY_TRACT | Status: DC
  Filled 2019-02-25: qty 8.8

## 2019-02-25 MED ORDER — HYDROCODONE-ACETAMINOPHEN 5-325 MG PO TABS
1.0000 | ORAL_TABLET | Freq: Four times a day (QID) | ORAL | Status: DC | PRN
  Administered 2019-02-25: 1 via ORAL
  Filled 2019-02-25: qty 1

## 2019-02-25 MED ORDER — INSULIN GLARGINE 100 UNIT/ML ~~LOC~~ SOLN
50.0000 [IU] | Freq: Every day | SUBCUTANEOUS | Status: DC
  Filled 2019-02-25 (×2): qty 0.5

## 2019-02-25 MED ORDER — CLOPIDOGREL BISULFATE 75 MG PO TABS
75.0000 mg | ORAL_TABLET | Freq: Every day | ORAL | Status: DC
  Administered 2019-02-25 – 2019-02-26 (×2): 75 mg via ORAL
  Filled 2019-02-25 (×2): qty 1

## 2019-02-25 MED ORDER — SODIUM CHLORIDE 0.9 % IV BOLUS
500.0000 mL | Freq: Once | INTRAVENOUS | Status: AC
  Administered 2019-02-25: 500 mL via INTRAVENOUS

## 2019-02-25 MED ORDER — ONDANSETRON 4 MG PO TBDP
4.0000 mg | ORAL_TABLET | Freq: Once | ORAL | Status: DC
  Filled 2019-02-25: qty 1

## 2019-02-25 MED ORDER — INSULIN ASPART 100 UNIT/ML ~~LOC~~ SOLN
0.0000 [IU] | Freq: Three times a day (TID) | SUBCUTANEOUS | Status: DC
  Administered 2019-02-25: 15 [IU] via SUBCUTANEOUS

## 2019-02-25 MED ORDER — DEXAMETHASONE SODIUM PHOSPHATE 10 MG/ML IJ SOLN
10.0000 mg | Freq: Once | INTRAMUSCULAR | Status: AC
  Administered 2019-02-25: 10 mg via INTRAVENOUS
  Filled 2019-02-25: qty 1

## 2019-02-25 MED ORDER — ALBUTEROL SULFATE (2.5 MG/3ML) 0.083% IN NEBU: 3.0000 mL | INHALATION_SOLUTION | RESPIRATORY_TRACT | Status: DC | PRN

## 2019-02-25 MED ORDER — ATORVASTATIN CALCIUM 40 MG PO TABS
40.0000 mg | ORAL_TABLET | Freq: Every day | ORAL | Status: DC
  Administered 2019-02-25 – 2019-02-26 (×2): 40 mg via ORAL
  Filled 2019-02-25 (×2): qty 1

## 2019-02-25 MED ORDER — ALBUTEROL SULFATE HFA 108 (90 BASE) MCG/ACT IN AERS
4.0000 | INHALATION_SPRAY | Freq: Once | RESPIRATORY_TRACT | Status: AC
  Administered 2019-02-25: 4 via RESPIRATORY_TRACT
  Filled 2019-02-25: qty 6.7

## 2019-02-25 MED ORDER — GABAPENTIN 400 MG PO CAPS
800.0000 mg | ORAL_CAPSULE | Freq: Three times a day (TID) | ORAL | Status: DC
  Administered 2019-02-25 – 2019-02-26 (×3): 800 mg via ORAL
  Filled 2019-02-25 (×3): qty 2

## 2019-02-25 MED ORDER — PANCRELIPASE (LIP-PROT-AMYL) 12000-38000 UNITS PO CPEP
36000.0000 [IU] | ORAL_CAPSULE | Freq: Three times a day (TID) | ORAL | Status: DC
  Administered 2019-02-25 – 2019-02-26 (×4): 36000 [IU] via ORAL
  Filled 2019-02-25: qty 1
  Filled 2019-02-25 (×2): qty 3
  Filled 2019-02-25 (×3): qty 1

## 2019-02-25 MED ORDER — AZITHROMYCIN 250 MG PO TABS: 250.0000 mg | ORAL_TABLET | Freq: Every day | ORAL | Status: DC

## 2019-02-25 MED ORDER — ENOXAPARIN SODIUM 40 MG/0.4ML ~~LOC~~ SOLN
40.0000 mg | SUBCUTANEOUS | Status: DC
  Administered 2019-02-26: 40 mg via SUBCUTANEOUS
  Filled 2019-02-25: qty 0.4

## 2019-02-25 MED ORDER — IOHEXOL 350 MG/ML SOLN
100.0000 mL | Freq: Once | INTRAVENOUS | Status: AC | PRN
  Administered 2019-02-25: 80 mL via INTRAVENOUS

## 2019-02-25 MED ORDER — ACETAMINOPHEN 325 MG PO TABS: 650.0000 mg | ORAL_TABLET | Freq: Four times a day (QID) | ORAL | Status: DC | PRN

## 2019-02-25 MED ORDER — SODIUM CHLORIDE 0.9 % IV SOLN
2.0000 g | Freq: Once | INTRAVENOUS | Status: AC
  Administered 2019-02-25: 2 g via INTRAVENOUS
  Filled 2019-02-25: qty 20

## 2019-02-25 NOTE — ED Notes (Signed)
This RN notified MD Harbrecht that pt's CBG 547. Verbal order to give max dose on SSI, 15 units of insulin. Recheck CBG 2 hours. Call if CBG >400. Pt can still eat dinner, per MD.

## 2019-02-25 NOTE — ED Provider Notes (Addendum)
[facility] EMERGENCY DEPARTMENT Provider Note   CSN: [id number] Arrival date & time: 02/24/19  2028     History   Chief Complaint Chief Complaint  Patient presents with  . Chest Pain    HPI Morgan Beasley is a 58 y.o. female.     HPI  This is a 58 year old female with history of COPD, diabetes, hypertension, hyperlipidemia who presents with cough, chest pain, and shortness of breath.  Patient reports 3-day history of symptoms.  She reports nonproductive cough.  She also describes chest pain that is worse with cough.  She states that she has coughed so much that she also has soreness in her abdomen.  She states that she has felt short of breath.  She is a current smoker but has not smoked in the last 3 days.  She has used her inhaler with no relief.  She did not note herself to have a fever at home but her temperature here was 100.6.  She denies any sick contacts.  She states that she has been wearing a mask appropriately and does not go out.  She denies loss of sense of taste and smell.  Past Medical History:  Diagnosis Date  . Alcohol dependence ([place])   . Anxiety   . Breast cancer ([place])   . CHF (congestive heart failure) ([place])   . Cigarette nicotine dependence   . Colon polyps   . COPD (chronic obstructive pulmonary disease) ([place])   . Diabetes mellitus without complication ([place])   . Diabetic neuropathy ([place])   . Elevated lipase   . Hyperlipemia   . Hypertension   . Insomnia   . Lymphedema   . Marijuana use   . OSA treated with BiPAP   . PAD (peripheral artery disease) ([employer])   . Ulcer of foot [facility])    right    Patient Active Problem List   Diagnosis Date Noted  . Malignant neoplasm of upper-outer quadrant of right breast in female, estrogen receptor positive ([place]) 01/08/2019    Past Surgical History:  Procedure Laterality Date  . ABDOMINAL HYSTERECTOMY    . [facility]  . MASTECTOMY Right April 2016     OB  History   No obstetric history on file.      Home Medications    Prior to Admission medications   Medication Sig Start Date End Date Taking? Authorizing Provider  albuterol (PROAIR HFA) 108 (90 Base) MCG/ACT inhaler Inhale 2 puffs into the lungs every 6 (six) hours as needed. 05/19/14   [provider]  amitriptyline (ELAVIL) 10 MG tablet Take 5 tablets (50 mg total) by mouth at bedtime. 02/19/19   Armbruster, Carlota Raspberry, MD  atorvastatin (LIPITOR) 40 MG tablet Take 40 mg by mouth daily.    [provider]  budesonide-formoterol (SYMBICORT) 160-4.5 MCG/ACT inhaler Inhale 2 puffs into the lungs 2 (two) times daily.    [provider]  chlorpheniramine-HYDROcodone (TUSSIONEX PENNKINETIC ER) 10-8 MG/5ML SUER Take 5 mLs by mouth every 12 (twelve) hours as needed for cough. 04/08/15   Veryl Speak, MD  cilostazol (PLETAL) 100 MG tablet Take 100 mg by mouth 2 (two) times daily.    [provider]  clopidogrel (PLAVIX) 75 MG tablet Take 75 mg by mouth daily.    [provider]  cyclobenzaprine (FLEXERIL) 5 MG tablet Take 1 tablet by mouth at bedtime. 02/04/15   [provider]  diltiazem (DILACOR XR) 120 MG 24 hr  capsule Take 120 mg by mouth daily.    [provider]  furosemide (LASIX) 40 MG tablet Take 40 mg by mouth daily.    [provider]  gabapentin (NEURONTIN) 800 MG tablet Take 800 mg by mouth 3 (three) times daily.    [provider]  glimepiride (AMARYL) 4 MG tablet Take 4 mg by mouth daily with breakfast.    [provider]  HYDROcodone-acetaminophen (NORCO/VICODIN) 5-325 MG tablet Take 1 tablet by mouth every 6 (six) hours as needed for up to 14 days for moderate pain. 02/19/19 03/05/19  Armbruster, Carlota Raspberry, MD  insulin glargine (LANTUS) 100 UNIT/ML injection Inject 80 Units into the skin daily.    [provider]  insulin lispro (HUMALOG) 100 UNIT/ML injection Inject 14 Units into the skin 3  (three) times daily before meals.    [provider]  lipase/protease/amylase (CREON) 36000 UNITS CPEP capsule Take 2 caps p.o. during each meal and 1 cap p.o. during each snack 01/14/19   Willia Craze, NP  mirabegron ER (MYRBETRIQ) 50 MG TB24 tablet Take 50 mg by mouth daily.    [provider]  ondansetron (ZOFRAN) 4 MG tablet Take 4 mg by mouth every 8 (eight) hours as needed for nausea or vomiting.    [provider]  potassium chloride (K-DUR) 10 MEQ tablet Take 10 mEq by mouth daily.    [provider]  promethazine (PHENERGAN) 25 MG tablet Take 25 mg by mouth every 6 (six) hours as needed for nausea or vomiting.    [provider]    Family History Family History  Problem Relation Age of Onset  . Diabetes Other   . Heart disease Other   . Breast cancer Maternal Grandmother   . Breast cancer Paternal Grandmother   . Stroke Son     Social History Social History   Tobacco Use  . Smoking status: Former Smoker    Quit date: 03/24/2015    Years since quitting: 3.9  . Smokeless tobacco: Never Used  Substance Use Topics  . Alcohol use: Yes    Comment: Beer - "on the weekends hanging out, maybe 2 or 3"   . Drug use: Yes    Types: Marijuana    Comment: last used 8 2020     Allergies   Aspirin   Review of Systems Review of Systems  Constitutional: Positive for fever.  Respiratory: Positive for cough, shortness of breath and wheezing.   Cardiovascular: Positive for chest pain. Negative for leg swelling.  Gastrointestinal: Negative for abdominal pain, nausea and vomiting.  Genitourinary: Negative for dysuria.  All other systems reviewed and are negative.    Physical Exam Updated Vital Signs BP (!) 150/81   Pulse (!) 120   Temp 99.5 F (37.5 C) (Oral)   Resp (!) 22   Ht 1.664 m (5' 5.5")   Wt 91.2 kg   SpO2 96%   BMI 32.94 kg/m   Physical Exam Vitals signs and nursing note reviewed.  Constitutional:       Appearance: She is well-developed. She is obese.  HENT:     Head: Normocephalic and atraumatic.  Eyes:     Pupils: Pupils are equal, round, and reactive to light.  Neck:     Musculoskeletal: Neck supple.  Cardiovascular:     Rate and Rhythm: Regular rhythm. Tachycardia present.     Heart sounds: Normal heart sounds.  Pulmonary:     Effort: Pulmonary effort is normal. No  respiratory distress.     Breath sounds: No wheezing.     Comments: Fair air movement, occasional wheeze Abdominal:     General: Bowel sounds are normal.     Palpations: Abdomen is soft.  Musculoskeletal:     Right lower leg: She exhibits no tenderness. No edema.     Left lower leg: She exhibits no tenderness. No edema.  Skin:    General: Skin is warm and dry.  Neurological:     Mental Status: She is alert and oriented to person, place, and time.  Psychiatric:        Mood and Affect: Mood normal.      ED Treatments / Results  Labs (all labs ordered are listed, but only abnormal results are displayed) Labs Reviewed  BASIC METABOLIC PANEL - Abnormal; Notable for the following components:      Result Value   CO2 21 (*)    Glucose, Bld 260 (*)    Creatinine, Ser 1.16 (*)    Calcium 8.7 (*)    GFR calc non Af Amer 52 (*)    All other components within normal limits  CBC - Abnormal; Notable for the following components:   WBC 11.5 (*)    All other components within normal limits  CBG MONITORING, ED - Abnormal; Notable for the following components:   Glucose-Capillary 226 (*)    All other components within normal limits  TROPONIN I (HIGH SENSITIVITY) - Abnormal; Notable for the following components:   Troponin I (High Sensitivity) 87 (*)    All other components within normal limits  TROPONIN I (HIGH SENSITIVITY) - Abnormal; Notable for the following components:   Troponin I (High Sensitivity) 93 (*)    All other components within normal limits  SARS CORONAVIRUS 2 (TAT 6-24 HRS)  I-STAT BETA HCG BLOOD,  ED (MC, [facility], AP ONLY)    EKG EKG Interpretation  Date/Time:  Monday February 24 2019 20:44:45 EDT Ventricular Rate:  124 PR Interval:  130 QRS Duration: 80 QT Interval:  342 QTC Calculation: 491 R Axis:   45 Text Interpretation: Sinus tachycardia Nonspecific ST and T wave abnormality  inferiorly Abnormal ECG Confirmed by Thayer Jew [phone]) on 02/25/2019 3:48:25 AM   Radiology Dg Chest 2 View  Result Date: 02/24/2019 CLINICAL DATA:  Chest pain shortness of breath for 2 days EXAM: CHEST - 2 VIEW COMPARISON:  03/19/18 FINDINGS: Cardiac shadows within normal limits. Mild scarring is noted in the left mid lung. No focal infiltrate or sizable effusion is seen. No bony abnormality is noted. IMPRESSION: No acute abnormality seen. Electronically Signed   By: Inez Catalina M.D.   On: 02/24/2019 21:52    Procedures Procedures (including critical care time)  CRITICAL CARE Performed by: Merryl Hacker   Total critical care time: 35 minutes  Critical care time was exclusive of separately billable procedures and treating other patients.  Critical care was necessary to treat or prevent imminent or life-threatening deterioration.  Critical care was time spent personally by me on the following activities: development of treatment plan with patient and/or surrogate as well as nursing, discussions with consultants, evaluation of patient's response to treatment, examination of patient, obtaining history from patient or surrogate, ordering and performing treatments and interventions, ordering and review of laboratory studies, ordering and review of radiographic studies, pulse oximetry and re-evaluation of patient's condition.   Medications Ordered in ED Medications  sodium chloride flush (NS) 0.9 % injection 3 mL (has no administration in time range)  ondansetron (ZOFRAN-ODT) disintegrating tablet 4 mg (has no administration in time range)  albuterol (VENTOLIN HFA) 108 (90 Base) MCG/ACT  inhaler 4 puff (has no administration in time range)  dexamethasone (DECADRON) injection 10 mg (has no administration in time range)     Initial Impression / Assessment and Plan / ED Course  I have reviewed the triage vital signs and the nursing notes.  Pertinent labs & imaging results that were available during my care of the patient were reviewed by me and considered in my medical decision making (see chart for details).         Patient presents with chest pain, shortness of breath, cough.  Overall nontoxic-appearing.  Initial vital signs notable for temperature of 100.7.  She is tachycardic as well.  History is most suggestive of likely upper respiratory illness versus pneumonia.  She also has a history of COPD.  I have reviewed her labs.  Chest pain is very atypical and EKG is without ischemic changes.  Troponin is elevated but delta troponin is reassuring.  Do not feel her story is consistent with ACS, will send one more.  While high suspicion for infectious etiology, PE is also consideration given tachycardia.  Patient states that she did not use her inhaler in the last 10 hours.  Patient was given fluids.  Will obtain a CT scan to evaluate better her lung parenchyma and rule out a PE.  She was given Decadron and albuterol.  Covid testing was sent.  Patient signed out to Dr. Sedonia Small.  Final Clinical Impressions(s) / ED Diagnoses   Final diagnoses:  None    ED Discharge Orders    None       Leeam Cedrone, Barbette Hair, MD 02/25/19 [id number]    Merryl Hacker, MD 02/25/19 [phone]

## 2019-02-25 NOTE — ED Notes (Signed)
Patient transported to CT 

## 2019-02-25 NOTE — H&P (Addendum)
Date: 02/25/2019               Patient Name:  Morgan Beasley MRN: [id number]  DOB: 1961-02-21 Age / Sex: 58 y.o., female   PCP: Sonia Side., FNP         Medical Service: Internal Medicine Teaching Service         Attending Physician: Dr. Velna Ochs, MD    First Contact: Dr. Marianna Payment Pager: [phone]  Second Contact: Dr. [name] Seller Pager: [phone]       After Hours (After 5p/  First Contact Pager: [phone]  weekends / holidays): Second Contact Pager: [phone]   Chief Complaint: 2 days of coughing  History of Present Illness: Marlina Kalani is a 58 year old female with past medical history notable for COPD, diabetes mellitus that is insulin-dependent, ER/PR positive right-sided breast cancer s/p mastectomy, hypertension, diabetic neuropathy, tobacco use, CHF unspecified, and OSA who presented to the [facility] emergency department for a 2 day history of severe cough with associated upper abdominal pain while coughing.  The patient states that she was in her usual state of health until her chronic cough acutely worsened and persisted.  She denies phlegm production above her baseline clear/white sputum, she denies chest pain, or headache.  She endorses associated fever, acute [place] worsening of her generalized body aches and a few episodes of loose stool.  She has not tried anything to relieve her cough. The patient has been living at home since the Covid crisis began.  She denies sick contacts, but has been to the grocery store several times to pick up food but believes that she always wears her mask.  Meds:  Current Meds  Medication Sig  . albuterol (PROAIR HFA) 108 (90 Base) MCG/ACT inhaler Inhale 2 puffs into the lungs every 6 (six) hours as needed.  Marland Kitchen amitriptyline (ELAVIL) 10 MG tablet Take 5 tablets (50 mg total) by mouth at bedtime.  Marland Kitchen atorvastatin (LIPITOR) 40 MG tablet Take 40 mg by mouth daily.  . budesonide-formoterol (SYMBICORT) 80-4.5 MCG/ACT inhaler Inhale 2 puffs  into the lungs 2 (two) times daily.  . clopidogrel (PLAVIX) 75 MG tablet Take 75 mg by mouth daily.  . cyclobenzaprine (FLEXERIL) 5 MG tablet Take 1 tablet by mouth at bedtime as needed for muscle spasms.   . furosemide (LASIX) 40 MG tablet Take 40 mg by mouth daily.  Marland Kitchen gabapentin (NEURONTIN) 800 MG tablet Take 800 mg by mouth 3 (three) times daily.  Marland Kitchen glimepiride (AMARYL) 4 MG tablet Take 4 mg by mouth daily with breakfast.  . HYDROcodone-acetaminophen (NORCO/VICODIN) 5-325 MG tablet Take 1 tablet by mouth every 6 (six) hours as needed for up to 14 days for moderate pain.  Marland Kitchen insulin glargine (LANTUS) 100 UNIT/ML injection Inject 80 Units into the skin daily.  . insulin lispro (HUMALOG) 100 UNIT/ML injection Inject 14 Units into the skin 3 (three) times daily before meals.  . lipase/protease/amylase (CREON) 36000 UNITS CPEP capsule Take 2 caps p.o. during each meal and 1 cap p.o. during each snack  . losartan (COZAAR) 50 MG tablet Take 50 mg by mouth daily.  . ondansetron (ZOFRAN) 4 MG tablet Take 4 mg by mouth every 8 (eight) hours as needed for nausea or vomiting.  . potassium chloride (K-DUR) 10 MEQ tablet Take 10 mEq by mouth daily.  . promethazine (PHENERGAN) 25 MG tablet Take 25 mg by mouth every 6 (six) hours as needed for nausea or vomiting.  . trimethoprim (TRIMPEX)  100 MG tablet Take 100 mg by mouth 2 (two) times daily.  . [DISCONTINUED] budesonide-formoterol (SYMBICORT) 160-4.5 MCG/ACT inhaler Inhale 2 puffs into the lungs 2 (two) times daily.   Allergies: Allergies as of 02/24/2019 - Review Complete 02/24/2019  Allergen Reaction Noted  . Aspirin Other (See Comments) 04/08/2015   Past Medical History:  Diagnosis Date  . Alcohol dependence ([place])   . Anxiety   . Breast cancer ([place])   . CHF (congestive heart failure) ([place])   . Cigarette nicotine dependence   . Colon polyps   . COPD (chronic obstructive pulmonary disease) ([place])   . Diabetes mellitus without complication ([place])    . Diabetic neuropathy ([place])   . Elevated lipase   . Hyperlipemia   . Hypertension   . Insomnia   . Lymphedema   . Marijuana use   . OSA treated with BiPAP   . PAD (peripheral artery disease) ([place])   . Ulcer of foot ([place])    right    Family History:  Family History  Problem Relation Age of Onset  . Diabetes Other   . Heart disease Other   . Breast cancer Maternal Grandmother   . Breast cancer Paternal Grandmother   . Stroke Son    Social History:  She denies recent drug use with exclusion of marijuana Stop smoking 2 days prior previously smoked a third a pack per day for 35 years Denies EtOH use Lives at home  Review of Systems: A complete ROS was negative except as per HPI.   Physical Exam: Blood pressure 131/71, pulse (!) 113, temperature 99.5 F (37.5 C), temperature source Oral, resp. rate (!) 30, height 5' 5.5" (1.664 m), weight 91.2 kg, SpO2 96 %. Physical Exam Constitutional:      General: She is not in acute distress.    Appearance: She is well-developed. She is ill-appearing and diaphoretic.  HENT:     Head: Normocephalic and atraumatic.  Eyes:     Extraocular Movements: Extraocular movements intact.     Conjunctiva/sclera: Conjunctivae normal.     Pupils: Pupils are equal, round, and reactive to light.  Neck:     Musculoskeletal: Normal range of motion and neck supple.  Cardiovascular:     Rate and Rhythm: Regular rhythm. Tachycardia present.     Heart sounds: Normal heart sounds. No murmur.  Pulmonary:     Effort: Pulmonary effort is normal. Tachypnea present. No accessory muscle usage or respiratory distress.     Breath sounds: No stridor. Rhonchi present.  Abdominal:     General: Bowel sounds are normal. There is no distension.     Palpations: Abdomen is soft.     Tenderness: There is no abdominal tenderness.  Musculoskeletal:        General: No tenderness.     Right lower leg: She exhibits no tenderness. No edema.     Left lower leg: She  exhibits no tenderness. No edema.  Skin:    General: Skin is warm.     Capillary Refill: Capillary refill takes less than 2 seconds.  Neurological:     General: No focal deficit present.     Mental Status: She is alert. She is disoriented.  Psychiatric:        Mood and Affect: Mood normal.        Behavior: Behavior normal.    EKG: personally reviewed my interpretation is sinus tachycardia  CXR: personally reviewed my interpretation is rather unremarkable, no acute cardiopulmonary process identified, the costovertebral  angles are clear, there is no focal opacity identified  Assessment & Plan by Problem: Principal Problem:   Dyspnea and respiratory abnormalities Active Problems:   Diabetes mellitus (HCC)   Peripheral neuropathy   COPD (chronic obstructive pulmonary disease) (HCC)  Assessment: Haifa Stigler is a 58 year old female with past medical history notable for COPD, diabetes mellitus that is insulin-dependent, ER/PR positive right-sided breast cancer s/p mastectomy, hypertension, diabetic neuropathy, tobacco use, CHF unspecified, and OSA who presented to the [facility] emergency department for a 2 day history of severe cough and diaphoresis with associated upper abdominal pain while coughing.  She required 2 L of supplemental oxygen to maintain SPO2 saturation to greater than 90%.  The patient's presentation with cough and fever is concerning for lower respiratory tract infection.  Given her lack of increased sputum production or prominent as well as her lack of primary shortness of breath I am less concerned that this is a COPD exacerbation.  Neither the patient's CT or chest x-ray demonstrate a focal opacity consistent with an infiltrative process such as would be expected with a bacterial pneumonia.  The CT does note prevascular lymph node enlargement which could be concerning for metastatic disease given her breast cancer history.  However, there is no observable pleural effusion  or other deformity/abnormality that would explain her cough and or hypoxia.  Given the above I suspect most likely etiology to be a viral process due to the acuity and progression of her symptoms all in the setting of underlying COPD and prior breast cancer.  She was admitted for further evaluation and monitoring given her hypoxia.  Plan: Bronchitis: Etiology uncertain.  Suspect viral.  [id number] is certainly a possibility given the patient's limited contact this is not definitive.  Patient's COVID-19 test is pending and if negative would likely be in conjunction with a moderate pretest probability.  For now she should remain a person under investigation. -COVID-19 test ordered, pending -Continue COPD treatment -Supplemental oxygen as needed  Troponinemia: Mild elevation of patient's troponins 87 initially subsequently followed by 93, 125 and again 112.  Suspect demand ischemia due to her hypoxia, tachycardia and tachypnea.  The patient denies chest pain or ACS symptoms and given the flattening of her troponins without EKG changes to support ACS we wiill monitor for now.  COPD: Patients symptoms and exam (absent wheezing) are inconsistent with a COPD exacerbation.  There are no PFTs on file to support this diagnosis nor hyperinflation of the airways on imaging.  Patient endorses a history of being informed that she has COPD but does not recall undergoing pulmonary function testing.  She certainly has significant tobacco use history which could predispose her to such.  As she endorses positive response from her inhalers we will continue these but I do not see indication for systemic steroids at this time.  She is on ICS/LABA combo as well as a [place] as needed at home.  This would suggest treatment for asthma more so than COPD. -Continue albuterol inhaler as needed -Continue her home ICS/LABA with formulary mometasone/formoterol -Maintain O2 sats to 88-92% to prevent decreasing respiratory drive and  inducing hypercapnia  Diabetes mellitus type 2: Per the patient she requires approximately 80 units of Lantus and 14 units of Humalog 3 times daily in addition to her glimepiride.  We will hold the glimepiride and continue her insulin at reduced doses -Lantus 50 units nightly -SSI moderate -We will up titrate as indicated -CBGs WC  Hypertension: BP stable 131/71.  She has not had her antihypertensives in the past 24 hours.  I will hold the furosemide and losartan given her slight increase in serum creatinine and resume the losartan tomorrow.  Peripheral neuropathy: Patient has chronic nephropathy for which she is on gabapentin 800 mg 3 times daily at home.  We will continue this for now.  Chronic pancreatitis: The patient is currently free from her typical symptoms of chronic pancreatitis.  We will continue her home Creon and Norco.  Diet: Carb modified Code: Full DVT prophylaxis enoxaparin Dispo: Admit patient to Inpatient with expected length of stay greater than 2 midnights.  Signed: Kathi Ludwig, MD 02/25/2019, 12:25 PM  Pager: # [phone]

## 2019-02-25 NOTE — ED Notes (Signed)
Main lab to add on BNP 

## 2019-02-26 ENCOUNTER — Encounter (HOSPITAL_COMMUNITY): Payer: Self-pay

## 2019-02-26 DIAGNOSIS — Z888 Allergy status to other drugs, medicaments and biological substances status: Secondary | ICD-10-CM

## 2019-02-26 DIAGNOSIS — J90 Pleural effusion, not elsewhere classified: Secondary | ICD-10-CM

## 2019-02-26 DIAGNOSIS — Z886 Allergy status to analgesic agent status: Secondary | ICD-10-CM

## 2019-02-26 DIAGNOSIS — Z853 Personal history of malignant neoplasm of breast: Secondary | ICD-10-CM

## 2019-02-26 DIAGNOSIS — J449 Chronic obstructive pulmonary disease, unspecified: Secondary | ICD-10-CM

## 2019-02-26 DIAGNOSIS — I509 Heart failure, unspecified: Secondary | ICD-10-CM

## 2019-02-26 DIAGNOSIS — F17211 Nicotine dependence, cigarettes, in remission: Secondary | ICD-10-CM

## 2019-02-26 DIAGNOSIS — E1142 Type 2 diabetes mellitus with diabetic polyneuropathy: Secondary | ICD-10-CM

## 2019-02-26 DIAGNOSIS — I110 Hypertensive heart disease with heart failure: Secondary | ICD-10-CM

## 2019-02-26 DIAGNOSIS — R590 Localized enlarged lymph nodes: Secondary | ICD-10-CM

## 2019-02-26 DIAGNOSIS — R778 Other specified abnormalities of plasma proteins: Secondary | ICD-10-CM

## 2019-02-26 DIAGNOSIS — K861 Other chronic pancreatitis: Secondary | ICD-10-CM

## 2019-02-26 DIAGNOSIS — B9789 Other viral agents as the cause of diseases classified elsewhere: Secondary | ICD-10-CM

## 2019-02-26 DIAGNOSIS — Z885 Allergy status to narcotic agent status: Secondary | ICD-10-CM

## 2019-02-26 DIAGNOSIS — Z79899 Other long term (current) drug therapy: Secondary | ICD-10-CM

## 2019-02-26 DIAGNOSIS — J069 Acute upper respiratory infection, unspecified: Secondary | ICD-10-CM

## 2019-02-26 DIAGNOSIS — G4733 Obstructive sleep apnea (adult) (pediatric): Secondary | ICD-10-CM

## 2019-02-26 DIAGNOSIS — Z9011 Acquired absence of right breast and nipple: Secondary | ICD-10-CM

## 2019-02-26 DIAGNOSIS — Z794 Long term (current) use of insulin: Secondary | ICD-10-CM

## 2019-02-26 LAB — CBC
HCT: 36.6 % (ref 36.0–46.0)
Hemoglobin: 12.2 g/dL (ref 12.0–15.0)
MCH: 31.1 pg (ref 26.0–34.0)
MCHC: 33.3 g/dL (ref 30.0–36.0)
MCV: 93.4 fL (ref 80.0–100.0)
Platelets: 271 10*3/uL (ref 150–400)
RBC: 3.92 MIL/uL (ref 3.87–5.11)
RDW: 13.8 % (ref 11.5–15.5)
WBC: 12 10*3/uL — ABNORMAL HIGH (ref 4.0–10.5)
nRBC: 0 % (ref 0.0–0.2)

## 2019-02-26 LAB — GLUCOSE, CAPILLARY
Glucose-Capillary: 231 mg/dL — ABNORMAL HIGH (ref 70–99)
Glucose-Capillary: 330 mg/dL — ABNORMAL HIGH (ref 70–99)
Glucose-Capillary: 386 mg/dL — ABNORMAL HIGH (ref 70–99)

## 2019-02-26 LAB — COMPREHENSIVE METABOLIC PANEL
ALT: 20 U/L (ref 0–44)
AST: 22 U/L (ref 15–41)
Albumin: 2.5 g/dL — ABNORMAL LOW (ref 3.5–5.0)
Alkaline Phosphatase: 82 U/L (ref 38–126)
Anion gap: 10 (ref 5–15)
BUN: 24 mg/dL — ABNORMAL HIGH (ref 6–20)
CO2: 21 mmol/L — ABNORMAL LOW (ref 22–32)
Calcium: 8.8 mg/dL — ABNORMAL LOW (ref 8.9–10.3)
Chloride: 104 mmol/L (ref 98–111)
Creatinine, Ser: 1.31 mg/dL — ABNORMAL HIGH (ref 0.44–1.00)
GFR calc Af Amer: 52 mL/min — ABNORMAL LOW (ref >60)
GFR calc non Af Amer: 45 mL/min — ABNORMAL LOW (ref >60)
Glucose, Bld: 344 mg/dL — ABNORMAL HIGH (ref 70–99)
Potassium: 4.3 mmol/L (ref 3.5–5.1)
Sodium: 135 mmol/L (ref 135–145)
Total Bilirubin: 0.1 mg/dL — ABNORMAL LOW (ref 0.3–1.2)
Total Protein: 6.1 g/dL — ABNORMAL LOW (ref 6.5–8.1)

## 2019-02-26 MED ORDER — INSULIN GLARGINE 100 UNIT/ML ~~LOC~~ SOLN
30.0000 [IU] | Freq: Two times a day (BID) | SUBCUTANEOUS | Status: DC
  Administered 2019-02-26: 30 [IU] via SUBCUTANEOUS
  Filled 2019-02-26 (×2): qty 0.3

## 2019-02-26 MED ORDER — BENZONATATE 100 MG PO CAPS: 100.0000 mg | ORAL_CAPSULE | Freq: Two times a day (BID) | ORAL | 0 refills | Status: AC | PRN

## 2019-02-26 MED ORDER — BENZONATATE 100 MG PO CAPS: 100.0000 mg | ORAL_CAPSULE | Freq: Two times a day (BID) | ORAL | Status: DC | PRN

## 2019-02-26 MED ORDER — PREDNISONE 20 MG PO TABS: 40.0000 mg | ORAL_TABLET | Freq: Every day | ORAL | 0 refills | Status: AC

## 2019-02-26 MED ORDER — INSULIN ASPART 100 UNIT/ML ~~LOC~~ SOLN: 0.0000 [IU] | Freq: Every day | SUBCUTANEOUS | Status: DC

## 2019-02-26 MED ORDER — INSULIN ASPART 100 UNIT/ML ~~LOC~~ SOLN
0.0000 [IU] | Freq: Three times a day (TID) | SUBCUTANEOUS | Status: DC
  Administered 2019-02-26: 7 [IU] via SUBCUTANEOUS
  Administered 2019-02-26: 15 [IU] via SUBCUTANEOUS

## 2019-02-26 MED ORDER — SODIUM CHLORIDE 0.9 % IV BOLUS
500.0000 mL | Freq: Once | INTRAVENOUS | Status: AC
  Administered 2019-02-26: 500 mL via INTRAVENOUS

## 2019-02-26 MED ORDER — PREDNISONE 20 MG PO TABS
40.0000 mg | ORAL_TABLET | Freq: Every day | ORAL | Status: AC
  Administered 2019-02-26: 40 mg via ORAL
  Filled 2019-02-26: qty 2

## 2019-02-26 NOTE — Telephone Encounter (Signed)
Unable to reach patient by phone the last several days, and now she is in the hospital. Sent letter with Dr. Doyne Keel notes and recommendations

## 2019-02-26 NOTE — Progress Notes (Signed)
  Date: 02/26/2019  Patient name: Siniyah Oleson [facility]  Medical record number: [id number]  Date of birth: 19-Sep-1960        I have seen and evaluated this patient and I have discussed the plan of care with the house staff. Please see their note for complete details. I concur with their findings.  Please see my attested H&P from earlier today.   CT chest was significant for hilar adenopathy. Possibly due to her rhinovirus infection but with her history of breast cancer will need follow up with oncology and repeat CT chest in 3-6 months.    Velna Ochs, MD 02/26/2019, 1:34 PM

## 2019-02-26 NOTE — Progress Notes (Signed)
Subjective:  Patient was seen this morning on rounds. Patient states that she is feeling much better. She is able to maintain elevated spO2 saturation above  95% without supplemental O2. She denies chest pain, shortness of breath, abdominal pain. Continues to have a nonproductive cough.  Objective:  Vital signs in last 24 hours: Vitals:   02/26/19 0000 02/26/19 0040 02/26/19 0448 02/26/19 0900  BP: 130/84 133/74 125/81 113/77  Pulse: 96 93 86 95  Resp:  17 20 18   Temp:  (!) 97.5 F (36.4 C) (!) 97.5 F (36.4 C) 98.3 F (36.8 C)  TempSrc:  Oral Oral Oral  SpO2: 100% 97% 98% 99%  Weight:  92.9 kg    Height:  5' 5.5" (1.664 m)      Physical Exam: Physical Exam  Constitutional: She is oriented to person, place, and time. No distress.  HENT:  Head: Atraumatic.  Eyes: EOM are normal.  Neck: Normal range of motion.  Cardiovascular: Normal rate, regular rhythm, normal heart sounds and intact distal pulses. Exam reveals no gallop and no friction rub.  No murmur heard. Pulmonary/Chest: Effort normal. No respiratory distress. She has wheezes. She exhibits no tenderness.  Abdominal: Soft. Bowel sounds are normal. She exhibits no distension. There is no abdominal tenderness.  Musculoskeletal: Normal range of motion.        General: No edema.  Neurological: She is alert and oriented to person, place, and time.  Skin: Skin is warm and dry. She is not diaphoretic.       Pertinent labs/Imaging: CBC Latest Ref Rng & Units 02/26/2019 02/24/2019 04/08/2015  WBC 4.0 - 10.5 K/uL 12.0(H) 11.5(H) 7.7  Hemoglobin 12.0 - 15.0 g/dL 12.2 13.5 14.0  Hematocrit 36.0 - 46.0 % 36.6 39.9 40.8  Platelets 150 - 400 K/uL 271 285 286   CMP Latest Ref Rng & Units 02/26/2019 02/24/2019 04/08/2015  Glucose 70 - 99 mg/dL 344(H) 260(H) 513(H)  BUN 6 - 20 mg/dL 24(H) 17 17  Creatinine 0.44 - 1.00 mg/dL 1.31(H) 1.16(H) 1.02(H)  Sodium 135 - 145 mmol/L 135 135 133(L)  Potassium 3.5 - 5.1 mmol/L 4.3 3.6 4.2   Chloride 98 - 111 mmol/L 104 100 96(L)  CO2 22 - 32 mmol/L 21(L) 21(L) 27  Calcium 8.9 - 10.3 mg/dL 8.8(L) 8.7(L) 9.6  Total Protein 6.5 - 8.1 g/dL 6.1(L) - -  Total Bilirubin 0.3 - 1.2 mg/dL 0.1(L) - -  Alkaline Phos 38 - 126 U/L 82 - -  AST 15 - 41 U/L 22 - -  ALT 0 - 44 U/L 20 - -   Respiratory Panel: (+) Rhinovirus/enterovirus   CT chest:  No evidence of pulmonary embolus.  Mild cardiomegaly.  Scattered coronary artery calcifications.  Mediastinal adenopathy. Cannot exclude metastatic disease or lymphoma. Recommend clinical correlation for cancer history. Consider close interval follow-up with repeat CT in 3-6 months or further evaluation with PET CT.   Assessment/Plan:  Principal Problem:   Bronchitis Active Problems:   Diabetes mellitus ([place])   Peripheral neuropathy   COPD (chronic obstructive pulmonary disease) ([place])    Patient Summary: Morgan Beasley is a 58 year old female with a pertinent past medical history of COPD, diabetes mellitus (insulin dependent), ER/PR positive right-sided breast cancer (s/p mastectomy), hypertension, diabetic neuropathy, CHF, and OSA, who presented to [facility] with a 2-day history of severe cough causing upper abdominal pain.  No associated sputum production, chest pain, headaches.  She admits to associated fever, malaise/myalgias and new onset supplemental O2 requirement of  2L [place] without shortness of breath.  Patient has signs and symptoms concerning for an upper respiratory viral infection.  Overnight the patient has been able to maintain saturations in the high 90's on RA. She States that she is feeling much better than when she presented.   #Viral URI Patient presented with fevers and myalgias with a mild leukocytosis of 12 and is now requiring 2 L of supplemental oxygen to maintain saturations greater than 90%.  CT shows perivascular lymph node enlargement possibly concerning for metastatic disease given her history of breast  cancer but does not show any signs of pneumonia at this time.  Patient's respiratory panel came back positive for rhinovirus.  Patient has been able to maintain saturations without supplemental oxygen and is stable for discharge today.  #COPD:  On physical exam today the patient did have diffuse wheezing.  But overall has had improve shortness of breath.  She no longer needs supplemental oxygen at this time.  Considering her history of COPD and her current upper respiratory viral infection.  We will send her home to finish a 4-day course of steroids. - Finish 2 more days of prednisone 40 mg po   Diet: Carb modified IVF: none VTE: Enoxaparin Code: Full  Dispo: Anticipated discharge pending clinical improvement.   Marianna Payment, MD 02/26/2019, 1:24 PM Pager: [phone]

## 2019-02-27 NOTE — Discharge Summary (Signed)
Name: Morgan Beasley MRN: [id number] DOB: 13-Jan-1961 58 y.o. PCP: Morgan Beasley., FNP  Date of Admission: 02/24/2019  8:34 PM Date of Discharge: 02/26/2019 Attending Physician: No att. providers found  Discharge Diagnosis: 1. Upper Respiratory Viral Infection   Discharge Medications: Allergies as of 02/26/2019      Reactions   Nsaids Other (See Comments)   Pancreatitis   Tolmetin Other (See Comments)   Pancreatitis   Aspirin Other (See Comments)   "Makes my pancreas act up"    Tramadol Swelling      Medication List    TAKE these medications   amitriptyline 10 MG tablet Commonly known as: ELAVIL Take 5 tablets (50 mg total) by mouth at bedtime.   atorvastatin 40 MG tablet Commonly known as: LIPITOR Take 40 mg by mouth daily.   benzonatate 100 MG capsule Commonly known as: TESSALON Take 1 capsule (100 mg total) by mouth 2 (two) times daily as needed for up to 5 days for cough.   chlorpheniramine-HYDROcodone 10-8 MG/5ML Suer Commonly known as: Tussionex Pennkinetic ER Take 5 mLs by mouth every 12 (twelve) hours as needed for cough.   cyclobenzaprine 5 MG tablet Commonly known as: FLEXERIL Take 1 tablet by mouth at bedtime as needed for muscle spasms.   furosemide 40 MG tablet Commonly known as: LASIX Take 40 mg by mouth daily.   gabapentin 800 MG tablet Commonly known as: NEURONTIN Take 800 mg by mouth 3 (three) times daily.   glimepiride 4 MG tablet Commonly known as: AMARYL Take 4 mg by mouth daily with breakfast.   HumaLOG 100 UNIT/ML injection Generic drug: insulin lispro Inject 14 Units into the skin 3 (three) times daily before meals.   HYDROcodone-acetaminophen 5-325 MG tablet Commonly known as: NORCO/VICODIN Take 1 tablet by mouth every 6 (six) hours as needed for up to 14 days for moderate pain.   Lantus 100 UNIT/ML injection Generic drug: insulin glargine Inject 80 Units into the skin daily.   lipase/protease/amylase 36000 UNITS  Cpep capsule Commonly known as: Creon Take 2 caps p.o. during each meal and 1 cap p.o. during each snack   losartan 50 MG tablet Commonly known as: COZAAR Take 50 mg by mouth daily.   ondansetron 4 MG tablet Commonly known as: ZOFRAN Take 4 mg by mouth every 8 (eight) hours as needed for nausea or vomiting.   Plavix 75 MG tablet Generic drug: clopidogrel Take 75 mg by mouth daily.   potassium chloride 10 MEQ tablet Commonly known as: KLOR-CON Take 10 mEq by mouth daily.   predniSONE 20 MG tablet Commonly known as: DELTASONE Take 2 tablets (40 mg total) by mouth daily with breakfast for 2 days.   ProAir HFA 108 (90 Base) MCG/ACT inhaler Generic drug: albuterol Inhale 2 puffs into the lungs every 6 (six) hours as needed.   promethazine 25 MG tablet Commonly known as: PHENERGAN Take 25 mg by mouth every 6 (six) hours as needed for nausea or vomiting.   Symbicort 80-4.5 MCG/ACT inhaler Generic drug: budesonide-formoterol Inhale 2 puffs into the lungs 2 (two) times daily.   trimethoprim 100 MG tablet Commonly known as: TRIMPEX Take 100 mg by mouth 2 (two) times daily.       Disposition and follow-up:   [name] was discharged from [facility] in Good condition.  At the hospital follow up visit please address:  1.  Follow up:  (1) COPD -patient was discharged with 2 more days of  prednisone 40 mg.  She will need to be reevaluated to make sure that her wheezing has returned to baseline.  (2) Follow up with Oncology for recent CT scan showing mediastinal LAD.   2.  Labs / imaging needed at time of follow-up: CBC, CMP, CT/PET  3.  Pending labs/ test needing follow-up: none  Follow-up Appointments:   (1) [facility] -the clinic will call you to make an appointment.  Hospital Course by problem list: 1. Viral URI:  Patient presented with fevers, myalgias and a mild leukocytosis of 12 with an associated new oxygen  requirement of 2 L supplemental oxygen to maintain saturations greater than 90%.  CT of the chest showed no evidence of pneumonia.  Respiratory panel was positive for rhinovirus/enterovirus.  Patient treated conservatively for 24 hours and was able to be weaned off of supplemental oxygen and saturating greater than 95% on room air.  2.  COPD:  On physical exam the patient did have mild diffuse wheezing.  She denies shortness of breath and no longer required 2 L supplemental oxygen to maintain saturations.  Patient was started on steroids upon admission.  We will continue her on prednisone 40 mg to finish a 4-day course of steroids.  3. Hx. Of Breast Cancer:  During admission the patient had a CT scan of her chest showing mediastinal lymphadenopathy.  Patient states that beginning a mammogram soon and following up with her oncologist once discharged.  Patient will likely need interval follow-up with repeat CT in 3 to 6 months or further evaluation with PET CT to rule out metastatic disease.  Discharge Vitals:   BP 113/77 (BP Location: Left Arm)   Pulse 95   Temp 98.3 F (36.8 C) (Oral)   Resp 18   Ht 5' 5.5" (1.664 m)   Wt 92.9 kg   SpO2 99%   BMI 33.55 kg/m   Pertinent Labs, Studies, and Procedures:  CBC Latest Ref Rng & Units 02/26/2019 02/24/2019 04/08/2015  WBC 4.0 - 10.5 K/uL 12.0(H) 11.5(H) 7.7  Hemoglobin 12.0 - 15.0 g/dL 12.2 13.5 14.0  Hematocrit 36.0 - 46.0 % 36.6 39.9 40.8  Platelets 150 - 400 K/uL 271 285 286   CMP Latest Ref Rng & Units 02/26/2019 02/24/2019 04/08/2015  Glucose 70 - 99 mg/dL 344(H) 260(H) 513(H)  BUN 6 - 20 mg/dL 24(H) 17 17  Creatinine 0.44 - 1.00 mg/dL 1.31(H) 1.16(H) 1.02(H)  Sodium 135 - 145 mmol/L 135 135 133(L)  Potassium 3.5 - 5.1 mmol/L 4.3 3.6 4.2  Chloride 98 - 111 mmol/L 104 100 96(L)  CO2 22 - 32 mmol/L 21(L) 21(L) 27  Calcium 8.9 - 10.3 mg/dL 8.8(L) 8.7(L) 9.6  Total Protein 6.5 - 8.1 g/dL 6.1(L) - -  Total Bilirubin 0.3 - 1.2 mg/dL  0.1(L) - -  Alkaline Phos 38 - 126 U/L 82 - -  AST 15 - 41 U/L 22 - -  ALT 0 - 44 U/L 20 - -   Respiratory Panel: Rhinovirus/enterovirus   CT Chest: No evidence of pulmonary embolus. Mild cardiomegaly.  Scattered coronary artery calcifications. Mediastinal adenopathy.  Discharge Instructions: Discharge Instructions    Diet - low sodium heart healthy   Complete by: As directed    Discharge instructions   Complete by: As directed    You were hospitalized for Upper Respiratory Viral Infection. Thank you for allowing [place] to be part of your care.   We arranged for you to follow up at: Please make a hospital follow  up appointment at your PCP within a couple weeks.   Please note these changes made to your medications:   Please START taking: Restart home medications as well as the medications below  (1) Prednisone 40 mg daily starting (10/29) for 2 days.   Please STOP taking:    Please make sure to follow up with your PCP. If you get short of breath please come back to the Hospital.  Please call our clinic if you have any questions or concerns, we may be able to help and keep you from a long and expensive emergency room wait. Our clinic and after hours phone number is [phone], the best time to call is Monday through Friday 9 am to 4 pm but there is always someone available 24/7 if you have an emergency. If you need medication refills please notify your pharmacy one week in advance and they will send [place] a request.   Increase activity slowly   Complete by: As directed       Signed: Marianna Payment, MD 02/27/2019, 7:00 AM   Pager: [phone]

## 2019-03-02 LAB — CULTURE, BLOOD (ROUTINE X 2)
Culture: NO GROWTH
Culture: NO GROWTH
Special Requests: ADEQUATE

## 2019-03-03 ENCOUNTER — Telehealth: Payer: Self-pay

## 2019-03-03 ENCOUNTER — Other Ambulatory Visit: Payer: Self-pay

## 2019-03-03 MED ORDER — LORAZEPAM 1 MG PO TABS: 1.0000 mg | ORAL_TABLET | Freq: Three times a day (TID) | ORAL | 0 refills | Status: DC

## 2019-03-03 NOTE — Telephone Encounter (Signed)
RN spoke with patient.  Patient called into report that she had to reschedule MRI of breast due to recent hospital admission.  Pt is rescheduled for 03/19/2019.  Pt requesting to have medication sent over prior to MRI to help relax.   Per MD okay for Ativan 1mg  tablet 1 hour prior to MRI.  Pt notified, voiced understanding and appreciation.

## 2019-03-03 NOTE — Telephone Encounter (Signed)
RN returned call, voicemail left for patient to call back.  

## 2019-03-03 NOTE — Progress Notes (Signed)
Ativan RX successfully faxed to pharmacy.  Pt aware. RN educated pt to not drive due to sedative side effect of medication.

## 2019-03-11 NOTE — Telephone Encounter (Signed)
Patient has an appt with [facility] on 03-31-19, with Dr. Dian Situ in [place].  Pt is aware of the appt per PPM. [phone]

## 2019-03-11 NOTE — Telephone Encounter (Signed)
Thanks Jan 

## 2019-03-19 ENCOUNTER — Other Ambulatory Visit: Payer: Self-pay

## 2019-03-19 ENCOUNTER — Ambulatory Visit
Admission: RE | Admit: 2019-03-19 | Discharge: 2019-03-19 | Disposition: A | Discharge status: Home / Self Care | Payer: Medicare Other | Source: Ambulatory Visit | Attending: Hematology and Oncology | Admitting: Hematology and Oncology

## 2019-03-19 MED ORDER — GADOBUTROL 1 MMOL/ML IV SOLN
8.0000 mL | Freq: Once | INTRAVENOUS | Status: AC | PRN
  Administered 2019-03-19: 8 mL via INTRAVENOUS

## 2019-04-03 ENCOUNTER — Telehealth: Payer: Self-pay

## 2019-04-03 NOTE — Telephone Encounter (Signed)
-----   Message from Willia Craze, NP sent at 04/02/2019  2:14 PM EST ----- Beth, please turn this into official documentation so we do not re-request colon report in future. Thanks ----- Message ----- From: Lowell Guitar, RMA Sent: 03/17/2019   2:49 PM EST To: Willia Craze, NP  As we  discussed per previous GI doctor No colon done. ----- Message ----- From: Willia Craze, NP Sent: 03/11/2019   6:59 PM EST To: Lowell Guitar, RMA  Peter Congo, I got patient's EGD report but I think we also requested her colonoscopy which I did not receive.  Can you check on this.  When you get the colonoscopy please put on Dr. Doyne Keel desk as he has seen patient more recently than I have.  Her actual record request form will be in the tray on my desk if you need it again. Thanks

## 2019-04-17 ENCOUNTER — Ambulatory Visit: Payer: Medicare Other | Admitting: Gastroenterology

## 2019-05-26 ENCOUNTER — Encounter: Payer: Self-pay | Admitting: Gastroenterology

## 2019-05-26 ENCOUNTER — Ambulatory Visit (INDEPENDENT_AMBULATORY_CARE_PROVIDER_SITE_OTHER): Payer: Medicare Other | Admitting: Gastroenterology

## 2019-05-26 VITALS — BP 116/80 | HR 59 | Temp 97.8°F | Ht 65.5 in | Wt 210.5 lb

## 2019-05-26 DIAGNOSIS — Z86010 Personal history of colonic polyps: Secondary | ICD-10-CM | POA: Diagnosis not present

## 2019-05-26 DIAGNOSIS — Z01818 Encounter for other preprocedural examination: Secondary | ICD-10-CM

## 2019-05-26 DIAGNOSIS — G894 Chronic pain syndrome: Secondary | ICD-10-CM

## 2019-05-26 DIAGNOSIS — K861 Other chronic pancreatitis: Secondary | ICD-10-CM | POA: Diagnosis not present

## 2019-05-26 MED ORDER — SUPREP BOWEL PREP KIT 17.5-3.13-1.6 GM/177ML PO SOLN: ORAL | 0 refills | Status: DC

## 2019-05-26 MED ORDER — PANCRELIPASE (LIP-PROT-AMYL) 36000-114000 UNITS PO CPEP: ORAL_CAPSULE | ORAL | 2 refills | Status: DC

## 2019-05-26 NOTE — Progress Notes (Signed)
HPI :  59 year old female here for a follow-up visit for chronic abdominal pain and history of colon polyps.    Please see last clinic note for full details of her case. She has multiple medical problems to include history of alcohol abuse, chronic calcific pancreatitis, COPD, diabetes, chronic pain syndrome, PVD on chronic Plavix. She has had multiple CT scans at [facility] in the past few years showing scattered pancreatic calcifications concerning for chronic pancreatitis.  She has had prior ultrasound showing normal gallbladder and a HIDA scan in 2018 which was normal.  She was been advised to discontinue alcohol in the past she was placed on Creon.  She has been on Cymbalta in the past which did not help.  She has been on placed on gabapentin which does provide significant help for her pain and neuropathy.  She has been taking Creon since of last seen her, 2 capsules with meals and 1 with snacks, she states this is definitely provided some benefit to her pain and she is feeling better with it.  She has been taking gabapentin in 800 mg 3 times a day, as well as Elavil 50 mg a day.  She thinks this regimen has definitely helped her.  I had also referred her to pain management but she has not seen them yet, due to see them next month.  She has been on Cymbalta in the past which has not helped much.  Her most recent imaging was a noncontrast CT scan of the abdomen and pelvis the pancreas actually appeared normal as did the rest of her abdomen aside of vascular calcifications.  She has not been drinking any alcohol since have seen her, and she states she has not been smoking much at all since have seen her, she is doing much better job with tobacco cessation.  I obtain the records from her most recent colonoscopy since her last visit, as outlined. 12/31/2015 - Dr. Steward Drone - "fair prep" , diminutive transverse polyp removed c/w adenoma.  She denies any problems with her bowels other than occasional  constipation.  I asked her about her Plavix use, it is stated in her record she is on it, however she states she has not been on it for the past year.  She has had this in the past for PVD.  Prior imaging: CT scan 01/20/19 - normal gallbladder, normal pancreas noncontrast, vascular calcifications CT scan 09/08/2016 - pancreatic calcifications suggestive of chronic calcific pancreatitis ([facility]) CT scan 05/04/2016 - pancreatic calcifications suggestive of sequelae of prior pancreatitis    Past Medical History:  Diagnosis Date  . Alcohol dependence ([place])   . Anxiety   . Breast cancer ([place])   . CHF (congestive heart failure) ([place])   . Cigarette nicotine dependence   . Colon polyps   . COPD (chronic obstructive pulmonary disease) ([place])   . Diabetes mellitus without complication ([place])   . Diabetic neuropathy ([place])   . Elevated lipase   . Gout   . Hyperlipemia   . Hypertension   . Insomnia   . Lymphedema   . Marijuana use   . OSA treated with BiPAP   . PAD (peripheral artery disease) ([place])   . Ulcer of foot ([place])    right     Past Surgical History:  Procedure Laterality Date  . ABDOMINAL HYSTERECTOMY    . COLONOSCOPY     [facility] hospital  . MASTECTOMY Right April 2016   Family History  Problem Relation Age of Onset  .  Diabetes Other   . Heart disease Other   . Breast cancer Maternal Grandmother   . Breast cancer Paternal Grandmother   . Stroke Son    Social History   Tobacco Use  . Smoking status: Former Smoker    Quit date: 03/24/2015    Years since quitting: 4.1  . Smokeless tobacco: Never Used  Substance Use Topics  . Alcohol use: Yes    Comment: Beer - "on the weekends hanging out, maybe 2 or 3"   . Drug use: Yes    Types: Marijuana    Comment: last used 8 2020   Current Outpatient Medications  Medication Sig Dispense Refill  . albuterol (PROAIR HFA) 108 (90 Base) MCG/ACT inhaler Inhale 2 puffs into the lungs every 6 (six) hours as needed.    Marland Kitchen  amitriptyline (ELAVIL) 10 MG tablet Take 5 tablets (50 mg total) by mouth at bedtime. 150 tablet 3  . atorvastatin (LIPITOR) 40 MG tablet Take 40 mg by mouth daily.    . budesonide-formoterol (SYMBICORT) 80-4.5 MCG/ACT inhaler Inhale 2 puffs into the lungs 2 (two) times daily.    . chlorpheniramine-HYDROcodone (TUSSIONEX PENNKINETIC ER) 10-8 MG/5ML SUER Take 5 mLs by mouth every 12 (twelve) hours as needed for cough. 140 mL 0  . clopidogrel (PLAVIX) 75 MG tablet Take 75 mg by mouth daily.    . cyclobenzaprine (FLEXERIL) 5 MG tablet Take 1 tablet by mouth at bedtime as needed for muscle spasms.     . furosemide (LASIX) 40 MG tablet Take 40 mg by mouth daily.    Marland Kitchen gabapentin (NEURONTIN) 800 MG tablet Take 800 mg by mouth 3 (three) times daily.    Marland Kitchen glimepiride (AMARYL) 4 MG tablet Take 4 mg by mouth daily with breakfast.    . insulin glargine (LANTUS) 100 UNIT/ML injection Inject 80 Units into the skin daily.    . insulin lispro (HUMALOG) 100 UNIT/ML injection Inject 14 Units into the skin 3 (three) times daily before meals.    . lipase/protease/amylase (CREON) 36000 UNITS CPEP capsule Take 2 caps p.o. during each meal and 1 cap p.o. during each snack 240 capsule 5  . LORazepam (ATIVAN) 1 MG tablet Take 1 tablet (1 mg total) by mouth every 8 (eight) hours. 1 tablet 0  . losartan (COZAAR) 50 MG tablet Take 50 mg by mouth daily.    . potassium chloride (K-DUR) 10 MEQ tablet Take 10 mEq by mouth daily.    Marland Kitchen trimethoprim (TRIMPEX) 100 MG tablet Take 100 mg by mouth 2 (two) times daily.     No current facility-administered medications for this visit.   Allergies  Allergen Reactions  . Nsaids Other (See Comments)    Pancreatitis  . Tolmetin Other (See Comments)    Pancreatitis  . Aspirin Other (See Comments)    "Makes my pancreas act up"   . Tramadol Swelling     Review of Systems: All systems reviewed and negative except where noted in HPI.    Lab Results  Component Value Date   WBC  12.0 (H) 02/26/2019   HGB 12.2 02/26/2019   HCT 36.6 02/26/2019   MCV 93.4 02/26/2019   PLT 271 02/26/2019    Lab Results  Component Value Date   CREATININE 1.31 (H) 02/26/2019   BUN 24 (H) 02/26/2019   NA 135 02/26/2019   K 4.3 02/26/2019   CL 104 02/26/2019   CO2 21 (L) 02/26/2019    Lab Results  Component Value Date  ALT 20 02/26/2019   AST 22 02/26/2019   ALKPHOS 82 02/26/2019   BILITOT 0.1 (L) 02/26/2019     Physical Exam: BP 116/80   Pulse (!) 59   Temp 97.8 F (36.6 C)   Ht 5' 5.5" (1.664 m)   Wt 210 lb 8 oz (95.5 kg)   BMI 34.50 kg/m  Constitutional: Pleasant,well-developed, female in no acute distress. HEENT: Normocephalic and atraumatic. Conjunctivae are normal. No scleral icterus.  Lymphadenopathy: No cervical adenopathy noted. Neurological: Alert and oriented to person place and time. Skin: Skin is warm and dry. No rashes noted. Psychiatric: Normal mood and affect. Behavior is normal.   ASSESSMENT AND PLAN: 59 year old female here for reassessment of the following:  Chronic pain syndrome / chronic pancreatitis - on gabapentin, Elavil and Creon, the latter of which has definitely helps her symptoms.  She does have imaging on file on multiple other CT scans in the past at [facility] showing chronic pancreatitis, despite our most recent imaging which did not show it.  Given she is had a benefit from Creon I would continue it for now.  I again emphasized to complete alcohol abstinence and that she should not smoke any tobacco.  I otherwise had previously referred her to a pain management clinic and she will follow up with them for longer-term management of her the rest of her pain issues.  Refilled Creon today.  History of colon polyps - patient had an adenoma removed in 2017 in the setting of a fair prep.  Given the bowel preparation I am recommending a colonoscopy at this time.  Discussed risks and benefits and she wanted to proceed.  Recommended double  preparation light of her bowel prep issues in the past, she agreed to continue MiraLAX for constipation.  Further recommendations pending the results.  Given she has not taken Plavix in the past year I recommend she discuss this again with her primary care.  I spent 35 minutes of time, including in depth chart review, independent review of results as outlined above, communicating results with the patient directly, face-to-face time with the patient, coordinating care, and ordering studies and medications as appropriate, and documenting this encounter.   [place] Cellar, MD [facility]

## 2019-05-26 NOTE — Patient Instructions (Addendum)
If you are age 58 or older, your body mass index should be between 23-30. Your Body mass index is 34.5 kg/m. If this is out of the aforementioned range listed, please consider follow up with your Primary Care Provider.  If you are age 43 or younger, your body mass index should be between 19-25. Your Body mass index is 34.5 kg/m. If this is out of the aformentioned range listed, please consider follow up with your Primary Care Provider.   You have been scheduled for a colonoscopy. Please follow written instructions given to you at your visit today.  Please pick up your prep supplies at the pharmacy within the next 1-3 days. If you use inhalers (even only as needed), please bring them with you on the day of your procedure.  We are giving you a Suprep sample today to use for your colonoscopy prep.   You have informed Dr. Havery Moros that you ARE NOT TAKING PLAVIX AND HAVE NOT TAKEN IT FOR OVER 6 MONTHS.  DO NOT RESUME TAKING IT BEFORE YOUR COLONOSCOPY.  We have sent the following medications to your pharmacy for you to pick up at your convenience: Creon  Thank you for entrusting me with your care and for choosing [facility], Dr. [place] Cellar

## 2019-05-27 ENCOUNTER — Telehealth: Payer: Self-pay | Admitting: Gastroenterology

## 2019-05-27 NOTE — Telephone Encounter (Signed)
Okay thanks for letting me know. Morgan Beasley can you reach out to this patient to see if she is willing to reschedule for next week if she can? Thanks

## 2019-05-29 ENCOUNTER — Encounter: Payer: Medicare Other | Admitting: Gastroenterology

## 2019-06-03 ENCOUNTER — Other Ambulatory Visit: Payer: Self-pay | Admitting: Gastroenterology

## 2019-06-03 ENCOUNTER — Ambulatory Visit (INDEPENDENT_AMBULATORY_CARE_PROVIDER_SITE_OTHER): Payer: Medicare Other

## 2019-06-03 DIAGNOSIS — Z1159 Encounter for screening for other viral diseases: Secondary | ICD-10-CM

## 2019-06-04 LAB — SARS CORONAVIRUS 2 (TAT 6-24 HRS): SARS Coronavirus 2: NEGATIVE

## 2019-06-05 ENCOUNTER — Ambulatory Visit (AMBULATORY_SURGERY_CENTER): Payer: Medicare Other | Admitting: Gastroenterology

## 2019-06-05 ENCOUNTER — Encounter: Payer: Self-pay | Admitting: Gastroenterology

## 2019-06-05 ENCOUNTER — Other Ambulatory Visit: Payer: Self-pay

## 2019-06-05 ENCOUNTER — Telehealth: Payer: Self-pay

## 2019-06-05 VITALS — BP 136/92 | HR 97 | Temp 96.9°F | Resp 25 | Ht 65.0 in | Wt 210.0 lb

## 2019-06-05 DIAGNOSIS — Z86010 Personal history of colonic polyps: Secondary | ICD-10-CM | POA: Diagnosis not present

## 2019-06-05 DIAGNOSIS — Z538 Procedure and treatment not carried out for other reasons: Secondary | ICD-10-CM

## 2019-06-05 MED ORDER — SODIUM CHLORIDE 0.9 % IV SOLN: 500.0000 mL | Freq: Once | INTRAVENOUS | Status: DC

## 2019-06-05 NOTE — Patient Instructions (Signed)
YOU HAD AN ENDOSCOPIC PROCEDURE TODAY AT THE [facility]:   Refer to the procedure report that was given to you for any specific questions about what was found during the examination.  If the procedure report does not answer your questions, please call your gastroenterologist to clarify.  If you requested that your care partner not be given the details of your procedure findings, then the procedure report has been included in a sealed envelope for you to review at your convenience later.  YOU SHOULD EXPECT: Some feelings of bloating in the abdomen. Passage of more gas than usual.  Walking can help get rid of the air that was put into your GI tract during the procedure and reduce the bloating. If you had a lower endoscopy (such as a colonoscopy or flexible sigmoidoscopy) you may notice spotting of blood in your stool or on the toilet paper. If you underwent a bowel prep for your procedure, you may not have a normal bowel movement for a few days.  Please Note:  You might notice some irritation and congestion in your nose or some drainage.  This is from the oxygen used during your procedure.  There is no need for concern and it should clear up in a day or so.  SYMPTOMS TO REPORT IMMEDIATELY:   Following lower endoscopy (colonoscopy or flexible sigmoidoscopy):  Excessive amounts of blood in the stool  Significant tenderness or worsening of abdominal pains  Swelling of the abdomen that is new, acute  Fever of 100F or higher  For urgent or emergent issues, a gastroenterologist can be reached at any hour by calling [phone].   DIET:  We do recommend a small meal at first, but then you may proceed to your regular diet.  Drink plenty of fluids but you should avoid alcoholic beverages for 24 hours.  ACTIVITY:  You should plan to take it easy for the rest of today and you should NOT DRIVE or use heavy machinery until tomorrow (because of the sedation medicines used during the test).     FOLLOW UP: Our staff will call the number listed on your records 48-72 hours following your procedure to check on you and address any questions or concerns that you may have regarding the information given to you following your procedure. If we do not reach you, we will leave a message.  We will attempt to reach you two times.  During this call, we will ask if you have developed any symptoms of COVID 19. If you develop any symptoms (ie: fever, flu-like symptoms, shortness of breath, cough etc.) before then, please call [phone].  If you test positive for Covid 19 in the 2 weeks post procedure, please call and report this information to us.    If any biopsies were taken you will be contacted by phone or by letter within the next 1-3 weeks.  Please call us at [phone] if you have not heard about the biopsies in 3 weeks.    SIGNATURES/CONFIDENTIALITY: You and/or your care partner have signed paperwork which will be entered into your electronic medical record.  These signatures attest to the fact that that the information above on your After Visit Summary has been reviewed and is understood.  Full responsibility of the confidentiality of this discharge information lies with you and/or your care-partner. 

## 2019-06-05 NOTE — Progress Notes (Signed)
Patient stated that she was not going to stay any longer.  I asked her if she wanted me to call her ride number, and she took the card back.  Patient said that she was going, because "no one would call a ride."  She said that her daughter was  "trippin" on her.  Daughter came up to RR, and proceeded to turn around and leave. We asked her to stay, but she said no.  Apparently the daughter was supposed to call the number for a ride service but she left. The patient got out of her wheelchair and left the premisis against medical advice    Dr Havery Moros is aware.

## 2019-06-05 NOTE — Op Note (Signed)
[place] Patient Name: Morgan Beasley Procedure Date: 06/05/2019 11:47 AM MRN: [id number] Endoscopist: Remo Lipps P. Havery Moros , MD Age: 59 Referring MD:  Date of Birth: [birth year] Gender: Female Account #: [id number] Procedure:                Colonoscopy Indications:              High risk colon cancer surveillance: Personal                            history of colonic polyps (2017) Medicines:                Monitored Anesthesia Care Procedure:                Pre-Anesthesia Assessment:                           - Prior to the procedure, a History and Physical                            was performed, and patient medications and                            allergies were reviewed. The patient's tolerance of                            previous anesthesia was also reviewed. The risks                            and benefits of the procedure and the sedation                            options and risks were discussed with the patient.                            All questions were answered, and informed consent                            was obtained. Prior Anticoagulants: The patient has                            taken no previous anticoagulant or antiplatelet                            agents. ASA Grade Assessment: III - A patient with                            severe systemic disease. After reviewing the risks                            and benefits, the patient was deemed in                            satisfactory condition to undergo the procedure.  After obtaining informed consent, the colonoscope                            was passed under direct vision. Throughout the                            procedure, the patient's blood pressure, pulse, and                            oxygen saturations were monitored continuously. The                            Colonoscope was introduced through the anus with                            the intention of  advancing to the cecum. The scope                            was advanced to the sigmoid colon before the                            procedure was aborted. Medications were given. The                            colonoscopy was performed without difficulty. The                            patient tolerated the procedure well. The quality                            of the bowel preparation was poor. The rectum was                            photographed. Scope In: 11:54:30 AM Scope Out: 11:55:31 AM Total Procedure Duration: 0 hours 1 minute 1 second  Findings:                 The perianal and digital rectal examinations were                            normal.                           A large amount of semi-solid stool was found in the                            rectum, in the recto-sigmoid colon and in the                            sigmoid colon, precluding visualization. The                            preparation was inadequate for screening purposes  and sigmoid could not be traversed due to stool                            burden, the procedure was aborted. Small internal                            hemorrhoids noted on retroflexion. Complications:            No immediate complications. Estimated blood loss:                            None. Estimated Blood Loss:     Estimated blood loss: none. Impression:               - Preparation of the colon was poor and inadequate                            for screening purposes - procedure aborted. Recommendation:           - Patient has a contact number available for                            emergencies. The signs and symptoms of potential                            delayed complications were discussed with the                            patient. Return to normal activities tomorrow.                            Written discharge instructions were provided to the                            patient.                            - Resume previous diet.                           - Continue present medications.                           - Repeat colonoscopy because the bowel preparation                            was suboptimal, at a date the is convenient for the                            patient. Double preparation should be used (it it                            was not used during this exam), with high dose                            Miralax daily  in the week preceeding colonoscopy. Remo Lipps P. Jr Milliron, MD 06/05/2019 12:00:39 PM This report has been signed electronically.

## 2019-06-05 NOTE — Telephone Encounter (Signed)
-----   Message from Yetta Flock, MD sent at 06/05/2019  3:10 PM EST ----- Regarding: follow up colon Hi Morgan Beasley,  This patient had a very poor prep for her colonoscopy. Would recommend a repeat colonoscopy using double prep (1st half Sutab, second half miralax / gatorade). I would give her a few weeks before calling, she doesn't want to do this anytime soon, can be in a few months. Thank you

## 2019-06-05 NOTE — Progress Notes (Signed)
PT taken to PACU. Monitors in place. VSS. Report given to RN. 

## 2019-06-05 NOTE — Telephone Encounter (Signed)
Staff reminder in to call patient in a few weeks to try to reschedule her colonoscopy, for a poor prep.

## 2019-06-09 ENCOUNTER — Telehealth: Payer: Self-pay

## 2019-06-09 NOTE — Telephone Encounter (Signed)
  Follow up Call-  Call back number 06/05/2019  Post procedure Call Back phone  # [phone]  Permission to leave phone message Yes  Some recent data might be hidden     Patient questions:  Do you have a fever, pain , or abdominal swelling? No. Pain Score  0 *  Have you tolerated food without any problems? Yes.    Have you been able to return to your normal activities? Yes.    Do you have any questions about your discharge instructions: Diet   No. Medications  No. Follow up visit  No.  Do you have questions or concerns about your Care? No.  Actions: * If pain score is 4 or above: No action needed, pain <4. 1. Have you developed a fever since your procedure? no  2.   Have you had an respiratory symptoms (SOB or cough) since your procedure? no  3.   Have you tested positive for COVID 19 since your procedure no  4.   Have you had any family members/close contacts diagnosed with the COVID 19 since your procedure?  no   If yes to any of these questions please route to Joylene John, RN and Alphonsa Gin, Therapist, sports.

## 2019-06-16 ENCOUNTER — Telehealth: Payer: Self-pay

## 2019-06-16 NOTE — Telephone Encounter (Signed)
Per Dr. Havery Moros, records sent to Dr. Dian Situ at [facility].(Office notes from 05-26-19 and 02-19-19 visits and CT abd and pelvis 12-2018)

## 2019-07-02 ENCOUNTER — Telehealth: Payer: Self-pay

## 2019-07-02 NOTE — Telephone Encounter (Signed)
Patient in agreement to reschedule her Colonoscopy, because of a poor prep. Scheduled colonoscopy in the [place] on 07/15/19, patient to arrive at 9:00am. Pre-Visit scheduled on 07/09/19, patient to arrive at 1:45pm and have a DOUBLE Prep with Sutab the 1st day and Miralax/Gator-Aid the 2nd day. COVID testing on 07/11/19 at 10:00am. Patient aware of all the above

## 2019-07-09 ENCOUNTER — Other Ambulatory Visit: Payer: Self-pay

## 2019-07-09 ENCOUNTER — Ambulatory Visit (AMBULATORY_SURGERY_CENTER): Payer: Self-pay | Admitting: *Deleted

## 2019-07-09 VITALS — Temp 96.6°F | Ht 65.5 in | Wt 215.6 lb

## 2019-07-09 DIAGNOSIS — Z01818 Encounter for other preprocedural examination: Secondary | ICD-10-CM

## 2019-07-09 DIAGNOSIS — Z86010 Personal history of colonic polyps: Secondary | ICD-10-CM

## 2019-07-09 NOTE — Progress Notes (Signed)
Pt states she hasn't taken Plavix "in months" Pt's abdomen is distended- she states "I am always like this and just need to take my water pill."  I spoke with Dr. Havery Moros about this pt and he is aware of her chronic medical problems.  Ok to proceed  One hour spent with pt at [facility]. She is having trouble comprehending the Sutab and Miralax prep as advised in TE from 07-02-19. Ok received per MD to do our standard 2 day Sutab and Miralax prep.  She is also instructed to do 1 dose of Miralax daily.  Instructions reviewed with her several times.    Pt states she understands the prep instructions now.  Told to call back is she has any further questions or issues.  covid test 07-11-19 at 12 noon  Pt is aware that care partner will wait in the car during procedure; if they feel like they will be too hot or cold to wait in the car; they may wait in the 4 th floor lobby. Patient is aware to bring only one care partner. We want them to wear a mask (we do not have any that we can provide them), practice social distancing, and we will check their temperatures when they get here.  I did remind the patient that their care partner needs to stay in the parking lot the entire time and have a cell phone available, we will call them when the pt is ready for discharge. Patient will wear mask into building.   No egg or soy allergy  No home oxygen use   No medications for weight loss taken  emmi information given   No trouble with anesthesia, difficulty with intubation or hx/fam hx of malignant hyperthermia per pt

## 2019-07-11 ENCOUNTER — Other Ambulatory Visit (HOSPITAL_COMMUNITY)
Admission: RE | Admit: 2019-07-11 | Discharge: 2019-07-11 | Disposition: A | Discharge status: Home / Self Care | Payer: Medicare Other | Source: Ambulatory Visit | Attending: Gastroenterology | Admitting: Gastroenterology

## 2019-07-11 DIAGNOSIS — Z20822 Contact with and (suspected) exposure to covid-19: Secondary | ICD-10-CM | POA: Diagnosis not present

## 2019-07-11 DIAGNOSIS — Z01812 Encounter for preprocedural laboratory examination: Secondary | ICD-10-CM | POA: Diagnosis present

## 2019-07-11 LAB — SARS CORONAVIRUS 2 (TAT 6-24 HRS): SARS Coronavirus 2: NEGATIVE

## 2019-07-15 ENCOUNTER — Ambulatory Visit (AMBULATORY_SURGERY_CENTER): Payer: Medicare Other | Admitting: Gastroenterology

## 2019-07-15 ENCOUNTER — Encounter: Payer: Self-pay | Admitting: Gastroenterology

## 2019-07-15 ENCOUNTER — Other Ambulatory Visit: Payer: Self-pay

## 2019-07-15 VITALS — BP 121/88 | HR 97 | Temp 96.6°F | Resp 24 | Ht 65.0 in | Wt 215.0 lb

## 2019-07-15 DIAGNOSIS — D120 Benign neoplasm of cecum: Secondary | ICD-10-CM

## 2019-07-15 DIAGNOSIS — D122 Benign neoplasm of ascending colon: Secondary | ICD-10-CM

## 2019-07-15 DIAGNOSIS — E1110 Type 2 diabetes mellitus with ketoacidosis without coma: Secondary | ICD-10-CM | POA: Diagnosis not present

## 2019-07-15 DIAGNOSIS — Z86010 Personal history of colonic polyps: Secondary | ICD-10-CM | POA: Diagnosis not present

## 2019-07-15 MED ORDER — SODIUM CHLORIDE 0.9 % IV SOLN: 500.0000 mL | Freq: Once | INTRAVENOUS | Status: DC

## 2019-07-15 MED ORDER — INSULIN REGULAR HUMAN 100 UNIT/ML IJ SOLN
10.0000 [IU] | Freq: Once | INTRAMUSCULAR | Status: AC
  Administered 2019-07-15: 10 [IU] via SUBCUTANEOUS

## 2019-07-15 NOTE — Progress Notes (Signed)
Pt's states no medical or surgical changes since previsit or office visit.  PTs CBG was 381, orders given to give 10unit SQ now. Given and patient tolerated well. Pt not symptomatic.

## 2019-07-15 NOTE — Progress Notes (Signed)
pt tolerated well. VSS. awake and to recovery. Report given to RN.  

## 2019-07-15 NOTE — Op Note (Signed)
[place] Patient Name: Morgan Beasley Procedure Date: 07/15/2019 9:59 AM MRN: [id number] Endoscopist: Remo Lipps P. Havery Moros , MD Age: 59 Referring MD:  Date of Birth: 29-Jan-1961 Gender: Female Account #: [id number] Procedure:                Colonoscopy Indications:              High risk colon cancer surveillance: Personal                            history of colonic polyps (adenoma 2017 - fair prep                            at that time). last colonoscopy February 2021                            aborted due to poor prep, double prep + Miralax                            used to prepare for this exam. Medicines:                Monitored Anesthesia Care Procedure:                Pre-Anesthesia Assessment:                           - Prior to the procedure, a History and Physical                            was performed, and patient medications and                            allergies were reviewed. The patient's tolerance of                            previous anesthesia was also reviewed. The risks                            and benefits of the procedure and the sedation                            options and risks were discussed with the patient.                            All questions were answered, and informed consent                            was obtained. Prior Anticoagulants: The patient has                            taken no previous anticoagulant or antiplatelet                            agents. ASA Grade Assessment: III - A patient with  severe systemic disease. After reviewing the risks                            and benefits, the patient was deemed in                            satisfactory condition to undergo the procedure.                           After obtaining informed consent, the colonoscope                            was passed under direct vision. Throughout the                            procedure, the patient's blood  pressure, pulse, and                            oxygen saturations were monitored continuously. The                            Colonoscope was introduced through the anus and                            advanced to the the cecum, identified by                            appendiceal orifice and ileocecal valve. The                            colonoscopy was performed without difficulty. The                            patient tolerated the procedure well. The quality                            of the bowel preparation was fair. The ileocecal                            valve, appendiceal orifice, and rectum were                            photographed. Scope In: 10:03:19 AM Scope Out: 10:27:16 AM Scope Withdrawal Time: 0 hours 16 minutes 33 seconds  Total Procedure Duration: 0 hours 23 minutes 57 seconds  Findings:                 The perianal and digital rectal examinations were                            normal.                           A large amount of semi-liquid stool was found in  the entire colon, making visualization difficult.                            Worst affected was right colon and cecal cap.                            Lavage of the colon was performed using copious                            amounts of sterile water, resulting in clearance of                            the transverse colon and left colon throughout with                            good visualization in these areas. Right colon was                            more difficult to clear. Several minutes spent                            lavaging the cecal cap - a layer of an adherent                            film of stool could not be washed off portions of                            the cecal cap. No obvious mass lesions or polyps                            but flat lesions would be challenging to see in                            this setting. No obvious large lesions in the right                             colon, proximal right colon also had adherent thin                            layer of stool difficult to wash off - flat lesions                            would be difficult to visualize in this area as                            well.                           A 3 mm polyp was found in the cecum. The polyp was                            sessile.  The polyp was removed with a cold snare.                            Resection and retrieval were complete.                           A 3 mm polyp was found in the ascending colon. The                            polyp was sessile. The polyp was removed with a                            cold snare. Resection and retrieval were complete.                           A few diverticula were found in the sigmoid colon.                           Internal hemorrhoids were found during retroflexion.                           The exam was otherwise without abnormality. Complications:            No immediate complications. Estimated blood loss:                            Minimal. Estimated Blood Loss:     Estimated blood loss was minimal. Impression:               - Preparation of the colon was fair, extensively                            lavaged as outlined above with good visualization                            in most portions of the colon, cecal cap and                            proximal right colon challenging to clear.                           - One 3 mm polyp in the cecum, removed with a cold                            snare. Resected and retrieved.                           - One 3 mm polyp in the ascending colon, removed                            with a cold snare. Resected and retrieved.                           - Diverticulosis in the sigmoid  colon.                           - Internal hemorrhoids.                           - The examination was otherwise normal. Recommendation:           - Patient has a contact number  available for                            emergencies. The signs and symptoms of potential                            delayed complications were discussed with the                            patient. Return to normal activities tomorrow.                            Written discharge instructions were provided to the                            patient.                           - Resume previous diet.                           - Continue present medications.                           - Await pathology results. Remo Lipps P. Artis Buechele, MD 07/15/2019 10:38:13 AM This report has been signed electronically.

## 2019-07-15 NOTE — Progress Notes (Signed)
CBG coming down to 362 after Reg insulin given.

## 2019-07-15 NOTE — Progress Notes (Signed)
Called to room to assist during endoscopic procedure.  Patient ID and intended procedure confirmed with present staff. Received instructions for my participation in the procedure from the performing physician.  

## 2019-07-15 NOTE — Patient Instructions (Signed)
Handouts given:  Diverticulosis, Hemorrhoids, Polyps Resume previous diet Continue current medications Await pathology results.   YOU HAD AN ENDOSCOPIC PROCEDURE TODAY AT THE [facility]:   Refer to the procedure report that was given to you for any specific questions about what was found during the examination.  If the procedure report does not answer your questions, please call your gastroenterologist to clarify.  If you requested that your care partner not be given the details of your procedure findings, then the procedure report has been included in a sealed envelope for you to review at your convenience later.  YOU SHOULD EXPECT: Some feelings of bloating in the abdomen. Passage of more gas than usual.  Walking can help get rid of the air that was put into your GI tract during the procedure and reduce the bloating. If you had a lower endoscopy (such as a colonoscopy or flexible sigmoidoscopy) you may notice spotting of blood in your stool or on the toilet paper. If you underwent a bowel prep for your procedure, you may not have a normal bowel movement for a few days.  Please Note:  You might notice some irritation and congestion in your nose or some drainage.  This is from the oxygen used during your procedure.  There is no need for concern and it should clear up in a day or so.  SYMPTOMS TO REPORT IMMEDIATELY:   Following lower endoscopy (colonoscopy or flexible sigmoidoscopy):  Excessive amounts of blood in the stool  Significant tenderness or worsening of abdominal pains  Swelling of the abdomen that is new, acute  Fever of 100F or higher    For urgent or emergent issues, a gastroenterologist can be reached at any hour by calling [phone]. Do not use MyChart messaging for urgent concerns.    DIET:  We do recommend a small meal at first, but then you may proceed to your regular diet.  Drink plenty of fluids but you should avoid alcoholic beverages for 24  hours.  ACTIVITY:  You should plan to take it easy for the rest of today and you should NOT DRIVE or use heavy machinery until tomorrow (because of the sedation medicines used during the test).    FOLLOW UP: Our staff will call the number listed on your records 48-72 hours following your procedure to check on you and address any questions or concerns that you may have regarding the information given to you following your procedure. If we do not reach you, we will leave a message.  We will attempt to reach you two times.  During this call, we will ask if you have developed any symptoms of COVID 19. If you develop any symptoms (ie: fever, flu-like symptoms, shortness of breath, cough etc.) before then, please call [phone].  If you test positive for Covid 19 in the 2 weeks post procedure, please call and report this information to us.    If any biopsies were taken you will be contacted by phone or by letter within the next 1-3 weeks.  Please call us at [phone] if you have not heard about the biopsies in 3 weeks.    SIGNATURES/CONFIDENTIALITY: You and/or your care partner have signed paperwork which will be entered into your electronic medical record.  These signatures attest to the fact that that the information above on your After Visit Summary has been reviewed and is understood.  Full responsibility of the confidentiality of this discharge information lies with you and/or your care-partner. 

## 2019-07-17 ENCOUNTER — Telehealth: Payer: Self-pay | Admitting: *Deleted

## 2019-07-17 ENCOUNTER — Telehealth: Payer: Self-pay

## 2019-07-17 NOTE — Telephone Encounter (Signed)
Left message on f/u call 

## 2019-07-17 NOTE — Telephone Encounter (Signed)
Second attempt follow up call to pt, lm on vm. 

## 2019-07-21 ENCOUNTER — Encounter: Payer: Self-pay | Admitting: Gastroenterology

## 2019-09-16 ENCOUNTER — Telehealth: Payer: Self-pay | Admitting: Gastroenterology

## 2019-09-17 MED ORDER — PANCRELIPASE (LIP-PROT-AMYL) 36000-114000 UNITS PO CPEP: ORAL_CAPSULE | ORAL | 3 refills | Status: DC

## 2019-09-17 NOTE — Telephone Encounter (Signed)
Patent last seen 05-2019. "Continue Creon". Refill sent to [facility].

## 2019-09-21 ENCOUNTER — Emergency Department (HOSPITAL_COMMUNITY): Payer: Medicare Other

## 2019-09-21 ENCOUNTER — Inpatient Hospital Stay (HOSPITAL_COMMUNITY)
Admission: EM | Admit: 2019-09-21 | Discharge: 2019-09-27 | DRG: 252 | Disposition: A | Discharge status: Home Health | Payer: Medicare Other | Attending: Family Medicine | Admitting: Family Medicine

## 2019-09-21 ENCOUNTER — Emergency Department (HOSPITAL_BASED_OUTPATIENT_CLINIC_OR_DEPARTMENT_OTHER): Payer: Medicare Other

## 2019-09-21 ENCOUNTER — Encounter (HOSPITAL_COMMUNITY): Payer: Self-pay

## 2019-09-21 ENCOUNTER — Other Ambulatory Visit: Payer: Self-pay

## 2019-09-21 DIAGNOSIS — R600 Localized edema: Secondary | ICD-10-CM

## 2019-09-21 DIAGNOSIS — Z833 Family history of diabetes mellitus: Secondary | ICD-10-CM

## 2019-09-21 DIAGNOSIS — L97229 Non-pressure chronic ulcer of left calf with unspecified severity: Secondary | ICD-10-CM | POA: Diagnosis present

## 2019-09-21 DIAGNOSIS — L03116 Cellulitis of left lower limb: Secondary | ICD-10-CM | POA: Diagnosis present

## 2019-09-21 DIAGNOSIS — Z9011 Acquired absence of right breast and nipple: Secondary | ICD-10-CM

## 2019-09-21 DIAGNOSIS — K5900 Constipation, unspecified: Secondary | ICD-10-CM | POA: Diagnosis not present

## 2019-09-21 DIAGNOSIS — R0600 Dyspnea, unspecified: Secondary | ICD-10-CM | POA: Diagnosis not present

## 2019-09-21 DIAGNOSIS — Z90710 Acquired absence of both cervix and uterus: Secondary | ICD-10-CM

## 2019-09-21 DIAGNOSIS — E11622 Type 2 diabetes mellitus with other skin ulcer: Secondary | ICD-10-CM | POA: Diagnosis present

## 2019-09-21 DIAGNOSIS — Z7951 Long term (current) use of inhaled steroids: Secondary | ICD-10-CM

## 2019-09-21 DIAGNOSIS — G4733 Obstructive sleep apnea (adult) (pediatric): Secondary | ICD-10-CM | POA: Diagnosis present

## 2019-09-21 DIAGNOSIS — N3281 Overactive bladder: Secondary | ICD-10-CM | POA: Diagnosis present

## 2019-09-21 DIAGNOSIS — I739 Peripheral vascular disease, unspecified: Secondary | ICD-10-CM

## 2019-09-21 DIAGNOSIS — R52 Pain, unspecified: Secondary | ICD-10-CM | POA: Diagnosis not present

## 2019-09-21 DIAGNOSIS — J449 Chronic obstructive pulmonary disease, unspecified: Secondary | ICD-10-CM | POA: Diagnosis present

## 2019-09-21 DIAGNOSIS — L97219 Non-pressure chronic ulcer of right calf with unspecified severity: Secondary | ICD-10-CM | POA: Diagnosis present

## 2019-09-21 DIAGNOSIS — Z6836 Body mass index (BMI) 36.0-36.9, adult: Secondary | ICD-10-CM

## 2019-09-21 DIAGNOSIS — I130 Hypertensive heart and chronic kidney disease with heart failure and stage 1 through stage 4 chronic kidney disease, or unspecified chronic kidney disease: Principal | ICD-10-CM | POA: Diagnosis present

## 2019-09-21 DIAGNOSIS — E1142 Type 2 diabetes mellitus with diabetic polyneuropathy: Secondary | ICD-10-CM

## 2019-09-21 DIAGNOSIS — E1165 Type 2 diabetes mellitus with hyperglycemia: Secondary | ICD-10-CM | POA: Diagnosis present

## 2019-09-21 DIAGNOSIS — Z66 Do not resuscitate: Secondary | ICD-10-CM | POA: Diagnosis present

## 2019-09-21 DIAGNOSIS — E785 Hyperlipidemia, unspecified: Secondary | ICD-10-CM | POA: Diagnosis present

## 2019-09-21 DIAGNOSIS — Z794 Long term (current) use of insulin: Secondary | ICD-10-CM

## 2019-09-21 DIAGNOSIS — I5033 Acute on chronic diastolic (congestive) heart failure: Secondary | ICD-10-CM

## 2019-09-21 DIAGNOSIS — K861 Other chronic pancreatitis: Secondary | ICD-10-CM | POA: Diagnosis present

## 2019-09-21 DIAGNOSIS — E1151 Type 2 diabetes mellitus with diabetic peripheral angiopathy without gangrene: Secondary | ICD-10-CM | POA: Diagnosis present

## 2019-09-21 DIAGNOSIS — Z853 Personal history of malignant neoplasm of breast: Secondary | ICD-10-CM

## 2019-09-21 DIAGNOSIS — N1831 Chronic kidney disease, stage 3a: Secondary | ICD-10-CM | POA: Diagnosis present

## 2019-09-21 DIAGNOSIS — E1122 Type 2 diabetes mellitus with diabetic chronic kidney disease: Secondary | ICD-10-CM | POA: Diagnosis present

## 2019-09-21 DIAGNOSIS — L03115 Cellulitis of right lower limb: Secondary | ICD-10-CM | POA: Diagnosis present

## 2019-09-21 DIAGNOSIS — M7989 Other specified soft tissue disorders: Secondary | ICD-10-CM | POA: Diagnosis not present

## 2019-09-21 DIAGNOSIS — Z79899 Other long term (current) drug therapy: Secondary | ICD-10-CM

## 2019-09-21 DIAGNOSIS — E669 Obesity, unspecified: Secondary | ICD-10-CM | POA: Diagnosis present

## 2019-09-21 DIAGNOSIS — F17210 Nicotine dependence, cigarettes, uncomplicated: Secondary | ICD-10-CM | POA: Diagnosis present

## 2019-09-21 DIAGNOSIS — Z20822 Contact with and (suspected) exposure to covid-19: Secondary | ICD-10-CM | POA: Diagnosis present

## 2019-09-21 DIAGNOSIS — Z170 Estrogen receptor positive status [ER+]: Secondary | ICD-10-CM

## 2019-09-21 DIAGNOSIS — K219 Gastro-esophageal reflux disease without esophagitis: Secondary | ICD-10-CM | POA: Diagnosis present

## 2019-09-21 LAB — GLUCOSE, CAPILLARY: Glucose-Capillary: 235 mg/dL — ABNORMAL HIGH (ref 70–99)

## 2019-09-21 LAB — HEPATIC FUNCTION PANEL
ALT: 20 U/L (ref 0–44)
AST: 20 U/L (ref 15–41)
Albumin: 2 g/dL — ABNORMAL LOW (ref 3.5–5.0)
Alkaline Phosphatase: 122 U/L (ref 38–126)
Bilirubin, Direct: <0.1 mg/dL (ref 0.0–0.2)
Total Bilirubin: 0.7 mg/dL (ref 0.3–1.2)
Total Protein: 5.9 g/dL — ABNORMAL LOW (ref 6.5–8.1)

## 2019-09-21 LAB — CBC
HCT: 41.1 % (ref 36.0–46.0)
Hemoglobin: 12.7 g/dL (ref 12.0–15.0)
MCH: 27.3 pg (ref 26.0–34.0)
MCHC: 30.9 g/dL (ref 30.0–36.0)
MCV: 88.4 fL (ref 80.0–100.0)
Platelets: 370 10*3/uL (ref 150–400)
RBC: 4.65 MIL/uL (ref 3.87–5.11)
RDW: 17.1 % — ABNORMAL HIGH (ref 11.5–15.5)
WBC: 8.8 10*3/uL (ref 4.0–10.5)
nRBC: 0 % (ref 0.0–0.2)

## 2019-09-21 LAB — BASIC METABOLIC PANEL
Anion gap: 14 (ref 5–15)
BUN: 8 mg/dL (ref 6–20)
CO2: 25 mmol/L (ref 22–32)
Calcium: 8.6 mg/dL — ABNORMAL LOW (ref 8.9–10.3)
Chloride: 99 mmol/L (ref 98–111)
Creatinine, Ser: 1.16 mg/dL — ABNORMAL HIGH (ref 0.44–1.00)
GFR calc Af Amer: >60 mL/min (ref >60)
GFR calc non Af Amer: 52 mL/min — ABNORMAL LOW (ref >60)
Glucose, Bld: 338 mg/dL — ABNORMAL HIGH (ref 70–99)
Potassium: 4 mmol/L (ref 3.5–5.1)
Sodium: 138 mmol/L (ref 135–145)

## 2019-09-21 LAB — CBG MONITORING, ED: Glucose-Capillary: 111 mg/dL — ABNORMAL HIGH (ref 70–99)

## 2019-09-21 LAB — URINALYSIS, ROUTINE W REFLEX MICROSCOPIC
Bilirubin Urine: NEGATIVE
Glucose, UA: >=500 mg/dL — AB
Ketones, ur: NEGATIVE mg/dL
Leukocytes,Ua: NEGATIVE
Nitrite: NEGATIVE
Protein, ur: >=300 mg/dL — AB
Specific Gravity, Urine: 1.007 (ref 1.005–1.030)
pH: 7 (ref 5.0–8.0)

## 2019-09-21 LAB — SARS CORONAVIRUS 2 BY RT PCR (HOSPITAL ORDER, PERFORMED IN ~~LOC~~ HOSPITAL LAB): SARS Coronavirus 2: NEGATIVE

## 2019-09-21 LAB — BRAIN NATRIURETIC PEPTIDE: B Natriuretic Peptide: 733.9 pg/mL — ABNORMAL HIGH (ref 0.0–100.0)

## 2019-09-21 MED ORDER — LOSARTAN POTASSIUM 50 MG PO TABS
50.0000 mg | ORAL_TABLET | Freq: Every day | ORAL | Status: DC
  Administered 2019-09-22 – 2019-09-27 (×6): 50 mg via ORAL
  Filled 2019-09-21 (×7): qty 1

## 2019-09-21 MED ORDER — ACETAMINOPHEN 325 MG PO TABS
650.0000 mg | ORAL_TABLET | ORAL | Status: DC | PRN
  Administered 2019-09-21: 650 mg via ORAL
  Filled 2019-09-21: qty 2

## 2019-09-21 MED ORDER — SODIUM CHLORIDE 0.9% FLUSH
3.0000 mL | Freq: Two times a day (BID) | INTRAVENOUS | Status: DC
  Administered 2019-09-21 – 2019-09-25 (×9): 3 mL via INTRAVENOUS

## 2019-09-21 MED ORDER — INSULIN ASPART 100 UNIT/ML ~~LOC~~ SOLN
0.0000 [IU] | Freq: Every day | SUBCUTANEOUS | Status: DC
  Administered 2019-09-21: 2 [IU] via SUBCUTANEOUS
  Administered 2019-09-22 – 2019-09-24 (×2): 3 [IU] via SUBCUTANEOUS
  Administered 2019-09-25: 4 [IU] via SUBCUTANEOUS
  Administered 2019-09-26: 2 [IU] via SUBCUTANEOUS

## 2019-09-21 MED ORDER — ENOXAPARIN SODIUM 40 MG/0.4ML ~~LOC~~ SOLN: 40.0000 mg | SUBCUTANEOUS | Status: DC

## 2019-09-21 MED ORDER — MOMETASONE FURO-FORMOTEROL FUM 100-5 MCG/ACT IN AERO
2.0000 | INHALATION_SPRAY | Freq: Two times a day (BID) | RESPIRATORY_TRACT | Status: DC
  Administered 2019-09-21 – 2019-09-25 (×9): 2 via RESPIRATORY_TRACT
  Filled 2019-09-21: qty 8.8

## 2019-09-21 MED ORDER — INSULIN GLARGINE 100 UNIT/ML ~~LOC~~ SOLN
40.0000 [IU] | Freq: Every day | SUBCUTANEOUS | Status: DC
  Administered 2019-09-22 – 2019-09-27 (×5): 40 [IU] via SUBCUTANEOUS
  Filled 2019-09-21 (×8): qty 0.4

## 2019-09-21 MED ORDER — GABAPENTIN 400 MG PO CAPS
800.0000 mg | ORAL_CAPSULE | Freq: Three times a day (TID) | ORAL | Status: DC
  Administered 2019-09-21 – 2019-09-27 (×18): 800 mg via ORAL
  Filled 2019-09-21 (×9): qty 2
  Filled 2019-09-21: qty 8
  Filled 2019-09-21 (×9): qty 2

## 2019-09-21 MED ORDER — GABAPENTIN 800 MG PO TABS
800.0000 mg | ORAL_TABLET | Freq: Three times a day (TID) | ORAL | Status: DC
  Filled 2019-09-21: qty 1

## 2019-09-21 MED ORDER — FUROSEMIDE 10 MG/ML IJ SOLN
40.0000 mg | Freq: Two times a day (BID) | INTRAMUSCULAR | Status: DC
  Administered 2019-09-22 – 2019-09-24 (×5): 40 mg via INTRAVENOUS
  Filled 2019-09-21 (×5): qty 4

## 2019-09-21 MED ORDER — FUROSEMIDE 10 MG/ML IJ SOLN
40.0000 mg | Freq: Once | INTRAMUSCULAR | Status: AC
  Administered 2019-09-21: 40 mg via INTRAVENOUS
  Filled 2019-09-21: qty 4

## 2019-09-21 MED ORDER — INSULIN ASPART 100 UNIT/ML IV SOLN: 10.0000 [IU] | Freq: Once | INTRAVENOUS | Status: DC

## 2019-09-21 MED ORDER — DOXYCYCLINE HYCLATE 100 MG PO CAPS: 100.0000 mg | ORAL_CAPSULE | Freq: Two times a day (BID) | ORAL | Status: DC

## 2019-09-21 MED ORDER — SODIUM CHLORIDE 0.9 % IV SOLN: 250.0000 mL | INTRAVENOUS | Status: DC | PRN

## 2019-09-21 MED ORDER — ONDANSETRON HCL 4 MG/2ML IJ SOLN: 4.0000 mg | Freq: Four times a day (QID) | INTRAMUSCULAR | Status: DC | PRN

## 2019-09-21 MED ORDER — SODIUM CHLORIDE 0.9% FLUSH: 3.0000 mL | INTRAVENOUS | Status: DC | PRN

## 2019-09-21 MED ORDER — TRIMETHOPRIM 100 MG PO TABS: 100.0000 mg | ORAL_TABLET | Freq: Two times a day (BID) | ORAL | Status: DC

## 2019-09-21 MED ORDER — INSULIN ASPART 100 UNIT/ML ~~LOC~~ SOLN
0.0000 [IU] | Freq: Three times a day (TID) | SUBCUTANEOUS | Status: DC
  Administered 2019-09-22: 3 [IU] via SUBCUTANEOUS
  Administered 2019-09-22 – 2019-09-24 (×7): 2 [IU] via SUBCUTANEOUS
  Administered 2019-09-25: 1 [IU] via SUBCUTANEOUS
  Administered 2019-09-25: 3 [IU] via SUBCUTANEOUS
  Administered 2019-09-25: 7 [IU] via SUBCUTANEOUS
  Administered 2019-09-26: 5 [IU] via SUBCUTANEOUS
  Administered 2019-09-27: 7 [IU] via SUBCUTANEOUS
  Administered 2019-09-27: 3 [IU] via SUBCUTANEOUS

## 2019-09-21 MED ORDER — CEPHALEXIN 500 MG PO CAPS
500.0000 mg | ORAL_CAPSULE | Freq: Four times a day (QID) | ORAL | Status: AC
  Administered 2019-09-21 – 2019-09-26 (×19): 500 mg via ORAL
  Filled 2019-09-21 (×20): qty 1

## 2019-09-21 MED ORDER — ALBUTEROL SULFATE (2.5 MG/3ML) 0.083% IN NEBU: 3.0000 mL | INHALATION_SOLUTION | Freq: Four times a day (QID) | RESPIRATORY_TRACT | Status: DC | PRN

## 2019-09-21 MED ORDER — AMITRIPTYLINE HCL 50 MG PO TABS
50.0000 mg | ORAL_TABLET | Freq: Every day | ORAL | Status: DC
  Administered 2019-09-21 – 2019-09-26 (×6): 50 mg via ORAL
  Filled 2019-09-21 (×6): qty 1

## 2019-09-21 MED ORDER — ENOXAPARIN SODIUM 60 MG/0.6ML ~~LOC~~ SOLN
0.5000 mg/kg | SUBCUTANEOUS | Status: DC
  Administered 2019-09-21 – 2019-09-26 (×6): 50 mg via SUBCUTANEOUS
  Filled 2019-09-21 (×6): qty 0.6

## 2019-09-21 MED ORDER — HYDROCODONE-ACETAMINOPHEN 5-325 MG PO TABS
2.0000 | ORAL_TABLET | Freq: Once | ORAL | Status: AC
  Administered 2019-09-21: 2 via ORAL
  Filled 2019-09-21: qty 2

## 2019-09-21 MED ORDER — PANCRELIPASE (LIP-PROT-AMYL) 12000-38000 UNITS PO CPEP
36000.0000 [IU] | ORAL_CAPSULE | Freq: Three times a day (TID) | ORAL | Status: DC
  Administered 2019-09-22 – 2019-09-27 (×15): 36000 [IU] via ORAL
  Filled 2019-09-21 (×12): qty 1
  Filled 2019-09-21 (×3): qty 3

## 2019-09-21 MED ORDER — ATORVASTATIN CALCIUM 40 MG PO TABS
40.0000 mg | ORAL_TABLET | Freq: Every day | ORAL | Status: DC
  Administered 2019-09-22 – 2019-09-27 (×5): 40 mg via ORAL
  Filled 2019-09-21 (×6): qty 1

## 2019-09-21 NOTE — ED Triage Notes (Signed)
Patient complains of bilateral leg swelling and complains of left foot pain, reports pain and swelling for months. Her MD thinks may be related to gout. Patient alert and oriented

## 2019-09-21 NOTE — ED Provider Notes (Signed)
[facility] EMERGENCY DEPARTMENT Provider Note   CSN: [id number] Arrival date & time: 09/21/19  1346     History No chief complaint on file.   Morgan Beasley is a 59 y.o. female.  Patient with history of alcohol dependence, nicotine dependence, high blood pressure, congestive heart failure, COPD, diabetes on Lantus presents with worsening bilateral foot swelling leg swelling, numbness and pain to lower extremities.  Similar to previous but getting worse the past week.  Patient says she is compliant with her medications.  Patient has a primary care doctor.  Patient does complain of shortness of breath worsening the past few days, patient has unknown amount of weight gain.  Patient has been on Lasix 40 mg twice a day.  Patient is on steroid inhaler however no oral steroids.        Past Medical History:  Diagnosis Date  . Alcohol dependence ([place])   . Anxiety   . Breast cancer ([place])   . CHF (congestive heart failure) ([place])   . Cigarette nicotine dependence   . Colon polyps   . COPD (chronic obstructive pulmonary disease) ([place])   . Diabetes mellitus without complication ([place])   . Diabetic neuropathy ([place])   . Elevated lipase   . Gout   . Hyperlipemia   . Hypertension   . Insomnia   . Lymphedema   . Marijuana use   . OSA treated with BiPAP   . PAD (peripheral artery disease) ([place])   . Sleep apnea    wears BIPAP  . Ulcer of foot [facility])    right    Patient Active Problem List   Diagnosis Date Noted  . Dyspnea 09/21/2019  . Bronchitis 02/25/2019  . Diabetes mellitus ([place]) 02/25/2019  . Peripheral neuropathy 02/25/2019  . COPD (chronic obstructive pulmonary disease) ([place]) 02/25/2019  . Malignant neoplasm of upper-outer quadrant of right breast in female, estrogen receptor positive ([place]) 01/08/2019    Past Surgical History:  Procedure Laterality Date  . ABDOMINAL HYSTERECTOMY    . [facility]  . MASTECTOMY Right April 2016      OB History   No obstetric history on file.     Family History  Problem Relation Age of Onset  . Diabetes Other   . Heart disease Other   . Breast cancer Maternal Grandmother   . Breast cancer Paternal Grandmother   . Stroke Son   . Colon cancer Neg Hx   . Esophageal cancer Neg Hx   . Rectal cancer Neg Hx   . Stomach cancer Neg Hx     Social History   Tobacco Use  . Smoking status: Light Tobacco Smoker    Types: Cigarettes    Last attempt to quit: 06/04/2019    Years since quitting: 0.2  . Smokeless tobacco: Never Used  Substance Use Topics  . Alcohol use: Not Currently    Comment: Beer - "on the weekends hanging out, maybe 2 or 3"   . Drug use: Not Currently    Types: Marijuana    Comment: last used 8 2020    Home Medications Prior to Admission medications   Medication Sig Start Date End Date Taking? Authorizing Provider  albuterol (PROAIR HFA) 108 (90 Base) MCG/ACT inhaler Inhale 2 puffs into the lungs every 6 (six) hours as needed for shortness of breath.  05/19/14  Yes [provider]  amitriptyline (ELAVIL) 10 MG tablet Take 5 tablets (50 mg total) by mouth at  bedtime. 02/19/19  Yes Armbruster, Carlota Raspberry, MD  atorvastatin (LIPITOR) 40 MG tablet Take 40 mg by mouth daily.   Yes [provider]  budesonide-formoterol (SYMBICORT) 80-4.5 MCG/ACT inhaler Inhale 2 puffs into the lungs 2 (two) times daily. 12/11/18  Yes [provider]  DILT-XR 120 MG 24 hr capsule Take 120 mg by mouth daily. 06/03/19  Yes [provider]  doxycycline (VIBRAMYCIN) 100 MG capsule Take 100 mg by mouth 2 (two) times daily. 06/03/19  Yes [provider]  furosemide (LASIX) 40 MG tablet Take 40 mg by mouth daily.   Yes [provider]  gabapentin (NEURONTIN) 800 MG tablet Take 800 mg by mouth 3 (three) times daily.   Yes [provider]  glimepiride (AMARYL) 4 MG tablet Take 8 mg by mouth daily with breakfast.    Yes [provider]  insulin glargine (LANTUS) 100 UNIT/ML injection Inject 80 Units into the skin daily.   Yes [provider]  insulin lispro (HUMALOG) 100 UNIT/ML injection Inject 14 Units into the skin 3 (three) times daily before meals.   Yes [provider]  lipase/protease/amylase (CREON) 36000 UNITS CPEP capsule Take 2 capsule by mouth with each meal and 1 capsule by mouth with each snack 09/17/19  Yes Armbruster, Carlota Raspberry, MD  losartan (COZAAR) 50 MG tablet Take 50 mg by mouth daily. 02/11/19  Yes [provider]  MYRBETRIQ 50 MG TB24 tablet Take 50 mg by mouth daily. 06/03/19  Yes [provider]  potassium chloride (K-DUR) 10 MEQ tablet Take 10 mEq by mouth daily as needed (For cramps).    Yes [provider]  trimethoprim (TRIMPEX) 100 MG tablet Take 100 mg by mouth 2 (two) times daily. 01/14/19  Yes [provider]    Allergies    Nsaids, Tolmetin, Aspirin, and Tramadol  Review of Systems   Review of Systems  Constitutional: Positive for fatigue. Negative for chills and fever.  HENT: Negative for congestion.   Eyes: Negative for visual disturbance.  Respiratory: Positive for shortness of breath. Negative for cough.   Cardiovascular: Positive for leg swelling. Negative for chest pain.  Gastrointestinal: Negative for abdominal pain and vomiting.  Genitourinary: Negative for dysuria and flank pain.  Musculoskeletal: Positive for back pain. Negative for neck pain and neck stiffness.  Skin: Negative for rash.  Neurological: Positive for numbness. Negative for weakness, light-headedness and headaches.    Physical Exam Updated Vital Signs BP 132/83 (BP Location: Left Arm)   Pulse (!) 103   Temp 98.2 F (36.8 C) (Oral)   Resp 20   Wt 95 kg   SpO2 94%   BMI 34.85 kg/m   Physical Exam Vitals and nursing note reviewed.  Constitutional:      Appearance: She is well-developed.  HENT:     Head: Normocephalic and atraumatic.   Eyes:     General:        Right eye: No discharge.        Left eye: No discharge.     Conjunctiva/sclera: Conjunctivae normal.  Neck:     Trachea: No tracheal deviation.  Cardiovascular:     Rate and Rhythm: Regular rhythm. Tachycardia present.  Pulmonary:     Effort: Pulmonary effort is normal.     Breath sounds: Rales (bases worse right lower) present.  Abdominal:     General: There is no distension.     Palpations: Abdomen is soft.     Tenderness: There is no abdominal  tenderness. There is no guarding.  Musculoskeletal:        General: Swelling and tenderness present. No signs of injury.     Cervical back: Normal range of motion and neck supple.  Skin:    General: Skin is warm.     Capillary Refill: Capillary refill takes less than 2 seconds.     Findings: No rash.     Comments: Patient has 2+ significant edema bilateral lower extremities pitting, hypersensitive to palpation of bilateral feet.  Patient has multiple superficial healing ulcerations without purulent draining or significant warmth.  Patient has edema extending to thighs.  Neurological:     Mental Status: She is alert and oriented to person, place, and time.     ED Results / Procedures / Treatments   Labs (all labs ordered are listed, but only abnormal results are displayed) Labs Reviewed  BASIC METABOLIC PANEL - Abnormal; Notable for the following components:      Result Value   Glucose, Bld 338 (*)    Creatinine, Ser 1.16 (*)    Calcium 8.6 (*)    GFR calc non Af Amer 52 (*)    All other components within normal limits  CBC - Abnormal; Notable for the following components:   RDW 17.1 (*)    All other components within normal limits  HEPATIC FUNCTION PANEL - Abnormal; Notable for the following components:   Total Protein 5.9 (*)    Albumin 2.0 (*)    All other components within normal limits  URINALYSIS, ROUTINE W REFLEX MICROSCOPIC - Abnormal; Notable for the following components:   Glucose, UA >=500  (*)    Hgb urine dipstick MODERATE (*)    Protein, ur >=300 (*)    Bacteria, UA RARE (*)    All other components within normal limits  BRAIN NATRIURETIC PEPTIDE - Abnormal; Notable for the following components:   B Natriuretic Peptide 733.9 (*)    All other components within normal limits  GLUCOSE, CAPILLARY - Abnormal; Notable for the following components:   Glucose-Capillary 235 (*)    All other components within normal limits  CBG MONITORING, ED - Abnormal; Notable for the following components:   Glucose-Capillary 111 (*)    All other components within normal limits  SARS CORONAVIRUS 2 BY RT PCR (HOSPITAL ORDER, [facility])  BASIC METABOLIC PANEL  HEMOGLOBIN A1C    EKG None  Radiology DG Chest 2 View  Result Date: 09/21/2019 CLINICAL DATA:  Shortness of breath and peripheral swelling EXAM: CHEST - 2 VIEW COMPARISON:  02/24/2019 FINDINGS: Cardiac shadow is enlarged. Vascular congestion is noted with bibasilar airspace opacities likely representing edema although infectious etiology cannot be totally excluded. No bony abnormality is noted. No sizable effusion is noted. IMPRESSION: Vascular congestion with likely bibasilar edema. Electronically Signed   By: Inez Catalina M.D.   On: 09/21/2019 16:31   DG Foot 2 Views Left  Result Date: 09/21/2019 CLINICAL DATA:  Bilateral leg swelling and left foot pain. EXAM: LEFT FOOT - 2 VIEW COMPARISON:  None. FINDINGS: There is no evidence of an acute fracture or dislocation. Chronic deformities are seen involving the distal left tibia and distal left fibula. There is no evidence of arthropathy. A large plantar calcaneal spur is noted. Marked severity diffuse soft tissue swelling is seen. IMPRESSION: Marked severity diffuse soft tissue swelling without evidence of acute osseous abnormality. Electronically Signed   By: Virgina Norfolk M.D.   On: 09/21/2019 16:16   VAS  [place] LOWER EXTREMITY VENOUS (DVT) (ONLY [facility] & [facility]  7a-7p)  Result Date: 09/21/2019  Lower Venous DVTStudy Indications: Swelling, Pain, and SOB.  Limitations: Body habitus and extreme swelling. Comparison Study: No prior study on file for comparison Performing Technologist: Sharion Dove RVS  Examination Guidelines: A complete evaluation includes B-mode imaging, spectral Doppler, color Doppler, and power Doppler as needed of all accessible portions of each vessel. Bilateral testing is considered an integral part of a complete examination. Limited examinations for reoccurring indications may be performed as noted. The reflux portion of the exam is performed with the patient in reverse Trendelenburg.  +---------+---------------+---------+-----------+----------+--------------+ RIGHT    CompressibilityPhasicitySpontaneityPropertiesThrombus Aging +---------+---------------+---------+-----------+----------+--------------+ CFV      Full                                         pulsatile      +---------+---------------+---------+-----------+----------+--------------+ SFJ      Full                                                        +---------+---------------+---------+-----------+----------+--------------+ FV Prox  Full                                                        +---------+---------------+---------+-----------+----------+--------------+ FV Mid   Full                                                        +---------+---------------+---------+-----------+----------+--------------+ FV DistalFull                                                        +---------+---------------+---------+-----------+----------+--------------+ PFV      Full                                                        +---------+---------------+---------+-----------+----------+--------------+ POP      Full                                         pulsatile       +---------+---------------+---------+-----------+----------+--------------+ PTV      Full                                                        +---------+---------------+---------+-----------+----------+--------------+ PERO     Full                                                        +---------+---------------+---------+-----------+----------+--------------+   +---------+---------------+---------+-----------+----------+--------------+  LEFT     CompressibilityPhasicitySpontaneityPropertiesThrombus Aging +---------+---------------+---------+-----------+----------+--------------+ CFV      Full                                         pulsatile      +---------+---------------+---------+-----------+----------+--------------+ SFJ      Full                                                        +---------+---------------+---------+-----------+----------+--------------+ FV Prox  Full                                                        +---------+---------------+---------+-----------+----------+--------------+ FV Mid   Full                                                        +---------+---------------+---------+-----------+----------+--------------+ FV DistalFull                                                        +---------+---------------+---------+-----------+----------+--------------+ PFV      Full                                                        +---------+---------------+---------+-----------+----------+--------------+ POP      Full                                         pulsatile      +---------+---------------+---------+-----------+----------+--------------+ PTV      Full                                                        +---------+---------------+---------+-----------+----------+--------------+ PERO     Full                                                         +---------+---------------+---------+-----------+----------+--------------+     Summary: RIGHT: - There is no evidence of deep vein thrombosis in the lower extremity.  - Ultrasound characteristics of enlarged lymph nodes are noted in the groin. Pulsatile waveforms consistent with fluid overload. Interstitial fluid noted throughout  LEFT: - There is no evidence of deep vein thrombosis  in the lower extremity.  Pulsatile waveforms consistent with fluid overload. Interstitial fluid noted throughout.  *See table(s) above for measurements and observations.    Preliminary     Procedures Procedures (including critical care time)  Medications Ordered in ED Medications  atorvastatin (LIPITOR) tablet 40 mg (40 mg Oral Not Given 09/21/19 2223)  amitriptyline (ELAVIL) tablet 50 mg (50 mg Oral Given 09/21/19 2144)  lipase/protease/amylase (CREON) capsule 36,000 Units (has no administration in time range)  albuterol (PROVENTIL) (2.5 MG/3ML) 0.083% nebulizer solution 3 mL (has no administration in time range)  mometasone-formoterol (DULERA) 100-5 MCG/ACT inhaler 2 puff (2 puffs Inhalation Given 09/21/19 2134)  sodium chloride flush (NS) 0.9 % injection 3 mL (3 mLs Intravenous Given 09/21/19 2224)  sodium chloride flush (NS) 0.9 % injection 3 mL (has no administration in time range)  0.9 %  sodium chloride infusion (has no administration in time range)  acetaminophen (TYLENOL) tablet 650 mg (650 mg Oral Given 09/21/19 2313)  ondansetron (ZOFRAN) injection 4 mg (has no administration in time range)  furosemide (LASIX) injection 40 mg (has no administration in time range)  losartan (COZAAR) tablet 50 mg (50 mg Oral Not Given 09/21/19 2223)  enoxaparin (LOVENOX) injection 50 mg (50 mg Subcutaneous Given 09/21/19 2144)  insulin glargine (LANTUS) injection 40 Units (has no administration in time range)  insulin aspart (novoLOG) injection 0-5 Units (2 Units Subcutaneous Given 09/21/19 2314)  insulin aspart (novoLOG)  injection 0-9 Units (has no administration in time range)  cephALEXin (KEFLEX) capsule 500 mg (500 mg Oral Given 09/21/19 2314)  gabapentin (NEURONTIN) capsule 800 mg (800 mg Oral Given 09/21/19 2144)  furosemide (LASIX) injection 40 mg (40 mg Intravenous Given 09/21/19 1743)  HYDROcodone-acetaminophen (NORCO/VICODIN) 5-325 MG per tablet 2 tablet (2 tablets Oral Given 09/21/19 1810)    ED Course  I have reviewed the triage vital signs and the nursing notes.  Pertinent labs & imaging results that were available during my care of the patient were reviewed by me and considered in my medical decision making (see chart for details).    MDM Rules/Calculators/A&P                      Patient presents with worsening signs and symptoms including leg edema, peripheral neuropathy and shortness of breath.  Concern clinically for worsening diabetes, lymphedema and edema secondary to possible congestive heart failure.  Patient's had weight gain, bilateral leg edema and exertional dyspnea.  Patient has been compliant with her insulin and Lasix dosing however no improvement in symptoms.  Due to significant symptoms patient has difficulty with activities of daily living.  Plan for IV Lasix, chest x-ray, reviewed basic blood work potassium normal, glucose 338.  IV insulin ordered.  Hemoglobin and white blood cell count normal range.  BNP and liver function added on.  Ultrasound bilateral lower extremities ordered for completeness to look for any signs of DVT.  X-ray reviewed consistent with mild pulmonary edema.  Glucose improved to 100s in the ER, insulin dose held.  IV Lasix given. Discussed with admission team for observation for further treatment and management.  Remainder of blood work reviewed normal hemoglobin, normal white blood cell count.  Urinalysis had glucose in the urine and hemoglobin.   Final Clinical Impression(s) / ED Diagnoses Final diagnoses:  Bilateral leg edema  Diabetic peripheral  neuropathy associated with type 2 diabetes mellitus ([place])    Rx / DC Orders ED Discharge Orders    None  Elnora Morrison, MD 09/21/19 [phone]

## 2019-09-21 NOTE — Progress Notes (Signed)
VASCULAR LAB PRELIMINARY  PRELIMINARY  PRELIMINARY  PRELIMINARY  Bilateral lower extremity venous duplex completed.    Preliminary report:  See CV proc for preliminary results.  Called report to Dr. Lesia Sago, [facility], RVT 09/21/2019, 4:43 PM

## 2019-09-21 NOTE — H&P (Signed)
[facility] Admission History and Physical Service Pager: [phone]  Patient name: Morgan Beasley [facility] Medical record number: [id number] Date of birth: 04/20/1961 Age: 59 y.o. Gender: female  Primary Care Provider: Sonia Side., FNP Consultants: CONSULT FOR UNASSIGNED MEDICAL ADMISSION CONSULT TO TRANSITION OF CARE TEAM Code Status: Prior  Preferred Emergency Contact :    Contact Information    Name Relation Home Work [place] Daughter   [phone]   Ladonna Snide [phone]        Chief Complaint: worsening dyspnea & BLEE    Assessment and Plan: Morgan Beasley is a 59 y.o. female who presented with acute SOB x 2 days and BLEE and was admitted for likely CHF exacerbation. .  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 Patient shortness of breath likely due to CHF exacerbation given bilateral lower extremity edema, crackles on lungs, edema on chest x-ray.  BNP is currently pending.  PE was considered in the setting of unilateral left foot erythema. Her Wells Score is 6.0 and she falls in moderate risk group. Patient denies any pleuritic pain, hemoptysis and prelimit ultrasound of lower extremities is negative for DVT.  Pneumonia is also unlikely given afebrile, absent cough, chest x-ray with no consolidations.  Patient also has history of COPD and reports taking her inhalers.  There is no wheezing on exam making COPD exacerbation less likely.  Admitting for possible CHF exacerbation, IV diuresis, echocardiogram. No cardiology in chart. Patient has PCP Dustin Folks NP), though is not within Epic system. Patient is s/p IV 40 mg Lasix in ED and had 1/2 L in vacuum container when I saw  patient in ED within an hour of dosing. No CP.  Marland Kitchen Admit to FMTS, attending Dr. Erin Hearing. Level of care: telemetry . Continuous cardiac monitoring  . Vitals per unit routine, O2 > 88%, OOB w/ assistance  . Echocardiogram, AM EKG  . Follow up studies -final DVT ultrasound, EKG . Follow up ED labs: BNP . AM Labs CMP, CBC . Call PCP in AM for records [id number] . Daily weights, strict IO   Foot Pain Left foot pain, especially with walking.  Obtain radiograph in ED of left foot, 2 views.  No bony abnormality appreciated, though diffuse swelling apparent. Patient also reports worsening pain with walking.  Can consider worsening diabetic polyneuropathy, though would expect bilaterally. Given allodynia and erythema, with DVT ruled out via ultrasound, concern for cellulitis.  Will start patient on p.o. Keflex for empiric coverage. - continue to monitor daily  - starting empiric keflex 500 mg QID x 5 days, 20 doses (5/23-5/28)  OSA Patient uses CPAP at home nightly -CPAP ordered  COPD  At home, patient taking Symbicort 2 puffs twice daily, albuterol as needed.  No wheezing on exam today.  Do not expect COPD exacerbation as cause of presentation.  We will continue home medications. Morgan Beasley as hospital formulary -Continue home albuterol as needed  IDDM Patient's diabetes regimen at home is Lantus 80 units, Humalog 14 units AC, glimepiride 8 mg daily with breakfast. Patient's glucose on admission was 338 with significant glucosuria.  In the ED, patient was given 10 units of NovoLog at 1800.  Her sugars  responded well and last CBG was 111.  Patient will be on carb modified, heart healthy diet.  Per med rec, patient last took her Lantus this morning.  Will hold glimepiride, use half dose of her Lantus, 40 units every morning. Glucosuria >500. Patient would be good candidate for SGLT2 inhibitor.  - halve home lantus, 40 units in the morning  - sensitive sliding scale AC  - holding home glimiperide   - CBG QID, TID AC + QHS correction   Diabetic polyneuropathy No ulcers appreciated on foot exam.  Patient does have thickened heels but no obvious cracks. -Continue home gabapentin 800 mg 3 times daily  CKD II  Patient's GFR consistently around or below 60.  On admission today, creatinine is 1.16 which appears to be her baseline since about 2016.  Patient with >300 protein and > 500 glucose.  Rare bacteria on micro and patient denies dysuria.  Also moderate hemoglobin on dipstick. - avoid nephrotoxic agents  - monitor Cr with daily BMP   HTN HLD  Blood pressures elevated in the ED. Patient reports that she has not taken her antihypertensives today. No history of MI or CHF in chart. (PCP outside of Epic system). Abnormal T waves on EKG in 2019.  Patient's last echocardiogram in December 2016 with 55 to 60% EF and moderate concentric LVH.  Everything otherwise normal.  EKG currently pending.  Cardiac medications at home include diltiazem XR 120 mg daily, losartan 50 mg daily, potassium as needed, Lasix 40 mg daily - continue home atorvastatin, losartan - holding home lasix, diltiazem   PVD Patient on Plavix, though per notes, has not taken for several months.  - consider restarting for primary v secondary prevention (follow up PCP notes)   Overactive bladder Patient takes Myrbetriq's 50 mg daily - will hold in setting of diuresis  - Purewick okay at this time   Chronic doxycycline 100 mg daily & trimethoprim 100 mg BID  Patient reports that she is no longer taking these medicaitons. Will hold at this time  -Follow-up with PCP records in the morning  Amitriptyline Unclear indication for this medication. Will continue at this time. Follow up with PCP records.  - Continue amitriptyline 50 mg at bed time   Chronic calcific pancreatitis Follows with GI outpatient. - Continue Creon with meals and with snacks  #FEN/GI:  . Fluids: fluid restriction 1200 mL daily  . Electrolytes:  replete PRN, stable   . Nutrition:  HH/CM   Access: PIV and Purewick  VTE prophylaxis: Lovenox 50   Disposition: Admit to Tele .  ============================================================================= HPI Morgan Beasley is a 59 y.o. female with past medical history significant for hypertension, poorly controlled diabetes, hyperlipidemia, OSA, COPD, chronic calcific pancreatitis, overactive bladder, history of breast cancer status postmastectomy, who presents with acute dyspnea x2 days.  Patient reports that she is compliant with all of her medications.  She reports she also has had worsening dyspnea on exertion.  She reports that she has not been peeing in response to her p.o. Lasix at home 40 mg.  She denies any chest pain, fevers, chills.  She follows with Eligah East, NP here in [place].  He is not within the epic system.  Patient also notes left lower extremity foot pain.  She reports that has been increasingly difficult to walk on given pain.  She has a history of peripheral neuropathy which she is currently taking gabapentin for.  In the ED, IV lasix 40 x 1 with 500cc  output within the first hour.   Abnormal Labs Reviewed  BASIC METABOLIC PANEL - Abnormal; Notable for the following components:      Result Value   Glucose, Bld 338 (*)    Creatinine, Ser 1.16 (*)    Calcium 8.6 (*)    GFR calc non Af Amer 52 (*)    All other components within normal limits  CBC - Abnormal; Notable for the following components:   RDW 17.1 (*)    All other components within normal limits  HEPATIC FUNCTION PANEL - Abnormal; Notable for the following components:   Total Protein 5.9 (*)    Albumin 2.0 (*)    All other components within normal limits  URINALYSIS, ROUTINE W REFLEX MICROSCOPIC - Abnormal; Notable for the following components:   Glucose, UA >=500 (*)    Hgb urine dipstick MODERATE (*)    Protein, ur >=300 (*)    Bacteria, UA RARE (*)    All other components within normal  limits  CBG MONITORING, ED - Abnormal; Notable for the following components:   Glucose-Capillary 111 (*)    All other components within normal limits    Review Of Systems: Review of Systems  Constitutional: Positive for activity change. Negative for fatigue and fever.  HENT: Negative for congestion, hearing loss, rhinorrhea, sinus pressure, sore throat and voice change.   Respiratory: Positive for apnea and shortness of breath. Negative for cough, chest tightness and wheezing.   Cardiovascular: Positive for leg swelling. Negative for chest pain and palpitations.  Gastrointestinal: Positive for abdominal distention. Negative for abdominal pain, diarrhea, nausea and vomiting.  Genitourinary: Negative for difficulty urinating and dysuria.  Musculoskeletal: Negative for arthralgias, back pain and joint swelling.       Painful to walk on feet  Skin: Positive for rash.  Neurological: Negative for dizziness, syncope, facial asymmetry, speech difficulty, weakness, light-headedness and numbness.  Psychiatric/Behavioral: Negative for confusion. The patient is nervous/anxious.    Patient Active Problem List   Diagnosis Date Noted  . Dyspnea 09/21/2019  . Bronchitis 02/25/2019  . Diabetes mellitus ([place]) 02/25/2019  . Peripheral neuropathy 02/25/2019  . COPD (chronic obstructive pulmonary disease) ([place]) 02/25/2019  . Malignant neoplasm of upper-outer quadrant of right breast in female, estrogen receptor positive ([place]) 01/08/2019   Past Medical History: Past Medical History:  Diagnosis Date  . Alcohol dependence ([place])   . Anxiety   . Breast cancer ([place])   . CHF (congestive heart failure) ([place])   . Cigarette nicotine dependence   . Colon polyps   . COPD (chronic obstructive pulmonary disease) ([place])   . Diabetes mellitus without complication ([place])   . Diabetic neuropathy ([place])   . Elevated lipase   . Gout   . Hyperlipemia   . Hypertension   . Insomnia   . Lymphedema   . Marijuana use    . OSA treated with BiPAP   . PAD (peripheral artery disease) ([place])   . Sleep apnea    wears BIPAP  . Ulcer of foot ([place])    right    Past Surgical History: Past Surgical History:  Procedure Laterality Date  . ABDOMINAL HYSTERECTOMY    . COLONOSCOPY     [facility] hospital  . MASTECTOMY Right April 2016    Family History: family history includes Breast cancer in her maternal grandmother and paternal grandmother; Diabetes in an other family member; Heart disease in an other family member; Stroke in her son. There is no history of Colon cancer, Esophageal  cancer, Rectal cancer, or Stomach cancer.   Social History: Sophia reports that she has been smoking cigarettes. She has never used smokeless tobacco. She reports previous alcohol use. She reports previous drug use. Drug: Marijuana.  Allergies and Medications: Allergies  Allergen Reactions  . Nsaids Other (See Comments)    Pancreatitis  . Tolmetin Other (See Comments)    Pancreatitis  . Aspirin Other (See Comments)    "Makes my pancreas act up"   . Tramadol Swelling   Current Meds  Medication Sig  . albuterol (PROAIR HFA) 108 (90 Base) MCG/ACT inhaler Inhale 2 puffs into the lungs every 6 (six) hours as needed for shortness of breath.   Marland Kitchen amitriptyline (ELAVIL) 10 MG tablet Take 5 tablets (50 mg total) by mouth at bedtime.  Marland Kitchen atorvastatin (LIPITOR) 40 MG tablet Take 40 mg by mouth daily.  . budesonide-formoterol (SYMBICORT) 80-4.5 MCG/ACT inhaler Inhale 2 puffs into the lungs 2 (two) times daily.  Marland Kitchen DILT-XR 120 MG 24 hr capsule Take 120 mg by mouth daily.  Marland Kitchen doxycycline (VIBRAMYCIN) 100 MG capsule Take 100 mg by mouth 2 (two) times daily.  . furosemide (LASIX) 40 MG tablet Take 40 mg by mouth daily.  Marland Kitchen gabapentin (NEURONTIN) 800 MG tablet Take 800 mg by mouth 3 (three) times daily.  Marland Kitchen glimepiride (AMARYL) 4 MG tablet Take 8 mg by mouth daily with breakfast.   . insulin glargine (LANTUS) 100 UNIT/ML injection Inject 80 Units  into the skin daily.  . insulin lispro (HUMALOG) 100 UNIT/ML injection Inject 14 Units into the skin 3 (three) times daily before meals.  . lipase/protease/amylase (CREON) 36000 UNITS CPEP capsule Take 2 capsule by mouth with each meal and 1 capsule by mouth with each snack  . losartan (COZAAR) 50 MG tablet Take 50 mg by mouth daily.  Marland Kitchen MYRBETRIQ 50 MG TB24 tablet Take 50 mg by mouth daily.  . potassium chloride (K-DUR) 10 MEQ tablet Take 10 mEq by mouth daily as needed (For cramps).   . trimethoprim (TRIMPEX) 100 MG tablet Take 100 mg by mouth 2 (two) times daily.    Objective: BP (!) 134/100   Pulse 100   Temp 98.6 F (37 C) (Oral)   Resp 17   Wt 95 kg   SpO2 98%   BMI 34.85 kg/m  Patient Vitals for the past 24 hrs:  BP Temp Temp src Pulse Resp SpO2 Weight  09/21/19 1919 -- 98.6 F (37 C) Oral -- -- -- 95 kg  09/21/19 1900 (!) 134/100 -- -- 100 17 98 % --  09/21/19 1600 (!) 142/95 -- -- (!) 103 (!) 22 100 % --  09/21/19 1438 (!) 155/91 98.7 F (37.1 C) Oral (!) 112 (!) 22 96 % --   Exam: Physical Exam Vitals and nursing note reviewed.  Constitutional:      General: She is in acute distress.     Appearance: She is obese. She is not ill-appearing, toxic-appearing or diaphoretic.  HENT:     Head: Normocephalic and atraumatic.     Nose: Nose normal. No congestion.     Mouth/Throat:     Mouth: Mucous membranes are dry.     Pharynx: Oropharynx is clear. No oropharyngeal exudate or posterior oropharyngeal erythema.  Eyes:     Extraocular Movements: Extraocular movements intact.     Conjunctiva/sclera: Conjunctivae normal.     Pupils: Pupils are equal, round, and reactive to light.  Neck:     Comments: JVD not appreciated with patient  at 45 degrees. Patient obese. Cardiovascular:     Rate and Rhythm: Regular rhythm. Tachycardia present.     Heart sounds: Normal heart sounds. No murmur. No friction rub.  Pulmonary:     Effort: Respiratory distress present.     Breath  sounds: Rales present.     Comments: Satting high 90's on room air  Chest:     Chest wall: No tenderness.  Abdominal:     General: Bowel sounds are normal. There is distension.     Palpations: Abdomen is soft.     Tenderness: There is no abdominal tenderness. There is no guarding or rebound.     Hernia: No hernia is present.     Comments: Mild distention, tympanic with dull flanks   Musculoskeletal:        General: Swelling and tenderness present.     Cervical back: Normal range of motion and neck supple.     Right lower leg: Edema present.     Left lower leg: Edema present.  Skin:    General: Skin is warm.     Capillary Refill: Capillary refill takes less than 2 seconds.     Findings: Lesion present.     Comments: Left LE erythematous and slightly warmer compared to right. Circular lesions appreciated on both lower extremities, see media. 2+ pitting edema to thighs. Allodynia to left distal foot and toes.   Neurological:     General: No focal deficit present.     Mental Status: She is alert and oriented to person, place, and time. Mental status is at baseline.     Cranial Nerves: No cranial nerve deficit.  Psychiatric:        Mood and Affect: Mood normal.        Behavior: Behavior normal.        Thought Content: Thought content normal.        Judgment: Judgment normal.     Labs and Imaging: I have personally reviewed following labs and imaging studies CBC: Recent Labs  Lab 09/21/19 1449  WBC 8.8  HGB 12.7  HCT 41.1  MCV 88.4  PLT 370   CMP: Recent Labs  Lab 09/21/19 1449  NA 138  K 4.0  CL 99  CO2 25  GLUCOSE 338*  BUN 8  CREATININE 1.16*  CALCIUM 8.6*  ALBUMIN 2.0*   Liver Function Tests: Recent Labs  Lab 09/21/19 1449  AST 20  ALT 20  ALKPHOS 122  BILITOT 0.7  PROT 5.9*  ALBUMIN 2.0*   BNP Pending   CBG: Recent Labs  Lab 09/21/19 1752  GLUCAP 111*   Imaging/Diagnostic Tests: DG Chest 2 View IMPRESSION: Vascular congestion with likely  bibasilar edema.   DG Foot 2 Views Left Result Date: 09/21/2019 IMPRESSION: Marked severity diffuse soft tissue swelling without evidence of acute osseous abnormality.   VAS [place] LOWER EXTREMITY VENOUS (DVT) (ONLY [facility] & [facility] 7a-7p)  Result Date: 09/21/2019  Lower Venous DVTStudy Indications: Swelling, Pain, and SOB.  Limitations: Body habitus and extreme swelling. Comparison Study: No prior study on file for comparison Performing Technologist: Sharion Dove RVS  Examination Guidelines: A complete evaluation includes B-mode imaging, spectral Doppler, color Doppler, and power Doppler as needed of all accessible portions of each vessel. Bilateral testing is considered an integral part of a complete examination. Limited examinations for reoccurring indications may be performed as noted. The reflux portion of the exam is performed with the patient in reverse Trendelenburg.  +---------+---------------+---------+-----------+----------+--------------+ RIGHT    CompressibilityPhasicitySpontaneityPropertiesThrombus  Aging +---------+---------------+---------+-----------+----------+--------------+ CFV      Full                                         pulsatile      +---------+---------------+---------+-----------+----------+--------------+ SFJ      Full                                                        +---------+---------------+---------+-----------+----------+--------------+ FV Prox  Full                                                        +---------+---------------+---------+-----------+----------+--------------+ FV Mid   Full                                                        +---------+---------------+---------+-----------+----------+--------------+ FV DistalFull                                                        +---------+---------------+---------+-----------+----------+--------------+ PFV      Full                                                         +---------+---------------+---------+-----------+----------+--------------+ POP      Full                                         pulsatile      +---------+---------------+---------+-----------+----------+--------------+ PTV      Full                                                        +---------+---------------+---------+-----------+----------+--------------+ PERO     Full                                                        +---------+---------------+---------+-----------+----------+--------------+   +---------+---------------+---------+-----------+----------+--------------+ LEFT     CompressibilityPhasicitySpontaneityPropertiesThrombus Aging +---------+---------------+---------+-----------+----------+--------------+ CFV      Full                                         pulsatile      +---------+---------------+---------+-----------+----------+--------------+  SFJ      Full                                                        +---------+---------------+---------+-----------+----------+--------------+ FV Prox  Full                                                        +---------+---------------+---------+-----------+----------+--------------+ FV Mid   Full                                                        +---------+---------------+---------+-----------+----------+--------------+ FV DistalFull                                                        +---------+---------------+---------+-----------+----------+--------------+ PFV      Full                                                        +---------+---------------+---------+-----------+----------+--------------+ POP      Full                                         pulsatile      +---------+---------------+---------+-----------+----------+--------------+ PTV      Full                                                         +---------+---------------+---------+-----------+----------+--------------+ PERO     Full                                                        +---------+---------------+---------+-----------+----------+--------------+     Summary: RIGHT: - There is no evidence of deep vein thrombosis in the lower extremity.  - Ultrasound characteristics of enlarged lymph nodes are noted in the groin. Pulsatile waveforms consistent with fluid overload. Interstitial fluid noted throughout  LEFT: - There is no evidence of deep vein thrombosis in the lower extremity.  Pulsatile waveforms consistent with fluid overload. Interstitial fluid noted throughout.  *See table(s) above for measurements and observations.    Preliminary    EKG: pending     Wilber Oliphant, M.D. 09/21/2019, 7:57 PM PGY-2, [place] [facility] pager: [phone], text pages welcome

## 2019-09-21 NOTE — ED Notes (Signed)
Patient transported to X-ray 

## 2019-09-21 NOTE — ED Provider Notes (Signed)
Ultrasound ED Peripheral IV (Provider)  Date/Time: 09/21/2019 5:44 PM Performed by: Godfrey Pick, MD Authorized by: Elnora Morrison, MD   Procedure details:    Indications: poor IV access     Skin Prep: chlorhexidine gluconate     Location:  Left anterior forearm   Angiocath:  20 G   Bedside Ultrasound Guided: Yes     Images: not archived     Patient tolerated procedure without complications: Yes     Dressing applied: Yes       Godfrey Pick, MD 09/21/19 1744    Elnora Morrison, MD 09/22/19 0009

## 2019-09-21 NOTE — ED Notes (Signed)
Report given to 50M

## 2019-09-22 ENCOUNTER — Observation Stay (HOSPITAL_COMMUNITY): Payer: Medicare Other

## 2019-09-22 ENCOUNTER — Inpatient Hospital Stay (HOSPITAL_COMMUNITY): Payer: Medicare Other

## 2019-09-22 DIAGNOSIS — E785 Hyperlipidemia, unspecified: Secondary | ICD-10-CM | POA: Diagnosis present

## 2019-09-22 DIAGNOSIS — G4733 Obstructive sleep apnea (adult) (pediatric): Secondary | ICD-10-CM | POA: Diagnosis present

## 2019-09-22 DIAGNOSIS — R600 Localized edema: Secondary | ICD-10-CM | POA: Diagnosis not present

## 2019-09-22 DIAGNOSIS — R0600 Dyspnea, unspecified: Secondary | ICD-10-CM

## 2019-09-22 DIAGNOSIS — I130 Hypertensive heart and chronic kidney disease with heart failure and stage 1 through stage 4 chronic kidney disease, or unspecified chronic kidney disease: Secondary | ICD-10-CM | POA: Diagnosis present

## 2019-09-22 DIAGNOSIS — I70202 Unspecified atherosclerosis of native arteries of extremities, left leg: Secondary | ICD-10-CM | POA: Diagnosis not present

## 2019-09-22 DIAGNOSIS — F17210 Nicotine dependence, cigarettes, uncomplicated: Secondary | ICD-10-CM | POA: Diagnosis present

## 2019-09-22 DIAGNOSIS — Z9011 Acquired absence of right breast and nipple: Secondary | ICD-10-CM | POA: Diagnosis not present

## 2019-09-22 DIAGNOSIS — I739 Peripheral vascular disease, unspecified: Secondary | ICD-10-CM | POA: Diagnosis not present

## 2019-09-22 DIAGNOSIS — K861 Other chronic pancreatitis: Secondary | ICD-10-CM | POA: Diagnosis present

## 2019-09-22 DIAGNOSIS — I70242 Atherosclerosis of native arteries of left leg with ulceration of calf: Secondary | ICD-10-CM

## 2019-09-22 DIAGNOSIS — N3281 Overactive bladder: Secondary | ICD-10-CM | POA: Diagnosis present

## 2019-09-22 DIAGNOSIS — L97129 Non-pressure chronic ulcer of left thigh with unspecified severity: Secondary | ICD-10-CM

## 2019-09-22 DIAGNOSIS — L03115 Cellulitis of right lower limb: Secondary | ICD-10-CM | POA: Diagnosis present

## 2019-09-22 DIAGNOSIS — Z170 Estrogen receptor positive status [ER+]: Secondary | ICD-10-CM | POA: Diagnosis not present

## 2019-09-22 DIAGNOSIS — Z79899 Other long term (current) drug therapy: Secondary | ICD-10-CM | POA: Diagnosis not present

## 2019-09-22 DIAGNOSIS — E1142 Type 2 diabetes mellitus with diabetic polyneuropathy: Secondary | ICD-10-CM | POA: Diagnosis present

## 2019-09-22 DIAGNOSIS — K219 Gastro-esophageal reflux disease without esophagitis: Secondary | ICD-10-CM | POA: Diagnosis present

## 2019-09-22 DIAGNOSIS — N183 Chronic kidney disease, stage 3 unspecified: Secondary | ICD-10-CM

## 2019-09-22 DIAGNOSIS — I70232 Atherosclerosis of native arteries of right leg with ulceration of calf: Secondary | ICD-10-CM

## 2019-09-22 DIAGNOSIS — L97219 Non-pressure chronic ulcer of right calf with unspecified severity: Secondary | ICD-10-CM | POA: Diagnosis present

## 2019-09-22 DIAGNOSIS — L0390 Cellulitis, unspecified: Secondary | ICD-10-CM

## 2019-09-22 DIAGNOSIS — Z90710 Acquired absence of both cervix and uterus: Secondary | ICD-10-CM | POA: Diagnosis not present

## 2019-09-22 DIAGNOSIS — E1122 Type 2 diabetes mellitus with diabetic chronic kidney disease: Secondary | ICD-10-CM | POA: Diagnosis present

## 2019-09-22 DIAGNOSIS — I5033 Acute on chronic diastolic (congestive) heart failure: Secondary | ICD-10-CM | POA: Diagnosis present

## 2019-09-22 DIAGNOSIS — Z20822 Contact with and (suspected) exposure to covid-19: Secondary | ICD-10-CM | POA: Diagnosis present

## 2019-09-22 DIAGNOSIS — J449 Chronic obstructive pulmonary disease, unspecified: Secondary | ICD-10-CM | POA: Diagnosis present

## 2019-09-22 DIAGNOSIS — E1151 Type 2 diabetes mellitus with diabetic peripheral angiopathy without gangrene: Secondary | ICD-10-CM | POA: Diagnosis present

## 2019-09-22 DIAGNOSIS — L03116 Cellulitis of left lower limb: Secondary | ICD-10-CM | POA: Diagnosis present

## 2019-09-22 DIAGNOSIS — Z66 Do not resuscitate: Secondary | ICD-10-CM | POA: Diagnosis present

## 2019-09-22 DIAGNOSIS — L97229 Non-pressure chronic ulcer of left calf with unspecified severity: Secondary | ICD-10-CM | POA: Diagnosis present

## 2019-09-22 DIAGNOSIS — Z794 Long term (current) use of insulin: Secondary | ICD-10-CM | POA: Diagnosis not present

## 2019-09-22 DIAGNOSIS — Z833 Family history of diabetes mellitus: Secondary | ICD-10-CM | POA: Diagnosis not present

## 2019-09-22 LAB — BASIC METABOLIC PANEL
Anion gap: 8 (ref 5–15)
BUN: 11 mg/dL (ref 6–20)
CO2: 28 mmol/L (ref 22–32)
Calcium: 8.5 mg/dL — ABNORMAL LOW (ref 8.9–10.3)
Chloride: 104 mmol/L (ref 98–111)
Creatinine, Ser: 1.24 mg/dL — ABNORMAL HIGH (ref 0.44–1.00)
GFR calc Af Amer: 55 mL/min — ABNORMAL LOW (ref >60)
GFR calc non Af Amer: 48 mL/min — ABNORMAL LOW (ref >60)
Glucose, Bld: 170 mg/dL — ABNORMAL HIGH (ref 70–99)
Potassium: 3.5 mmol/L (ref 3.5–5.1)
Sodium: 140 mmol/L (ref 135–145)

## 2019-09-22 LAB — HEMOGLOBIN A1C
Hgb A1c MFr Bld: 11.7 % — ABNORMAL HIGH (ref 4.8–5.6)
Mean Plasma Glucose: 289.09 mg/dL

## 2019-09-22 LAB — GLUCOSE, CAPILLARY
Glucose-Capillary: 152 mg/dL — ABNORMAL HIGH (ref 70–99)
Glucose-Capillary: 172 mg/dL — ABNORMAL HIGH (ref 70–99)
Glucose-Capillary: 217 mg/dL — ABNORMAL HIGH (ref 70–99)
Glucose-Capillary: 270 mg/dL — ABNORMAL HIGH (ref 70–99)

## 2019-09-22 LAB — ECHOCARDIOGRAM COMPLETE: Weight: 3506.2 oz

## 2019-09-22 LAB — URIC ACID: Uric Acid, Serum: 6.9 mg/dL (ref 2.5–7.1)

## 2019-09-22 LAB — SEDIMENTATION RATE: Sed Rate: 62 mm/hr — ABNORMAL HIGH (ref 0–22)

## 2019-09-22 MED ORDER — HYDROCODONE-ACETAMINOPHEN 5-325 MG PO TABS
1.0000 | ORAL_TABLET | Freq: Four times a day (QID) | ORAL | Status: DC | PRN
  Administered 2019-09-22 – 2019-09-23 (×4): 2 via ORAL
  Filled 2019-09-22 (×4): qty 2

## 2019-09-22 NOTE — Progress Notes (Signed)
FPTS Interim Progress Note  Called patient's PCP office. Staff stated patient was supposed to be referred to wound clinic for her wounds.  However, there was no referral placed in the chart.  Staff unable to tell me when the rash started.  Medical records to be faxed to [facility].  Lyndee Hensen, DO 09/22/2019, 3:13 PM PGY-1, [place] Medicine Service pager [phone]

## 2019-09-22 NOTE — Discharge Instructions (Signed)
For home health: Patient is requesting when reminding about daily medications, please observe patient performing injection and checking blood sugar.  Other helpful things to aide in reminding about insulin: Set up reminder on phone and leaving notes around home.   Hemoglobin A1c Test Why am I having this test? You may have the hemoglobin A1c test (HbA1c test) done to:  Evaluate your risk for developing diabetes (diabetes mellitus).  Diagnose diabetes.  Monitor long-term control of blood sugar (glucose) in people who have diabetes and help make treatment decisions. This test may be done with other blood glucose tests, such as fasting blood glucose and oral glucose tolerance tests. What is being tested? Hemoglobin is a type of protein in the blood that carries oxygen. Glucose attaches to hemoglobin to form glycated hemoglobin. This test checks the amount of glycated hemoglobin in your blood, which is a good indicator of the average amount of glucose in your blood during the past 2-3 months. What kind of sample is taken?  A blood sample is required for this test. It is usually collected by inserting a needle into a blood vessel. Tell a health care provider about:  All medicines you are taking, including vitamins, herbs, eye drops, creams, and over-the-counter medicines.  Any blood disorders you have.  Any surgeries you have had.  Any medical conditions you have.  Whether you are pregnant or may be pregnant. How are the results reported? Your results will be reported as a percentage that indicates how much of your hemoglobin has glucose attached to it (is glycated). Your health care provider will compare your results to normal ranges that were established after testing a large group of people (reference ranges). Reference ranges may vary among labs and hospitals. For this test, common reference ranges are:  Adult or child without diabetes: 4-5.6%.  Adult or child with diabetes and  good blood glucose control: less than 7%. What do the results mean? If you have diabetes:  A result of less than 7% is considered normal, meaning that your blood glucose is well controlled.  A result higher than 7% means that your blood glucose is not well controlled, and your treatment plan may need to be adjusted. If you do not have diabetes:  A result within the reference range is considered normal, meaning that you are not at high risk for diabetes.  A result of 5.7-6.4% means that you have a high risk of developing diabetes, and you may have prediabetes. Prediabetes is the condition of having a blood glucose level that is higher than it should be, but not high enough for you to be diagnosed with diabetes. Having prediabetes puts you at risk for developing type 2 diabetes (type 2 diabetes mellitus). You may have more tests, including a repeat HbA1c test.  Results of 6.5% or higher on two separate HbA1c tests mean that you have diabetes. You may have more tests to confirm the diagnosis. Abnormally low HbA1c values may be caused by:  Pregnancy.  Severe blood loss.  Receiving donated blood (transfusions).  Low red blood cell count (anemia).  Long-term kidney failure.  Some unusual forms (variants) of hemoglobin. Talk with your health care provider about what your results mean. Questions to ask your health care provider Ask your health care provider, or the department that is doing the test:  When will my results be ready?  How will I get my results?  What are my treatment options?  What other tests do I need?  What  are my next steps? Summary  The hemoglobin A1c test (HbA1c test) may be done to evaluate your risk for developing diabetes, to diagnose diabetes, and to monitor long-term control of blood sugar (glucose) in people who have diabetes and help make treatment decisions.  Hemoglobin is a type of protein in the blood that carries oxygen. Glucose attaches to  hemoglobin to form glycated hemoglobin. This test checks the amount of glycated hemoglobin in your blood, which is a good indicator of the average amount of glucose in your blood during the past 2-3 months.  Talk with your health care provider about what your results mean. This information is not intended to replace advice given to you by your health care provider. Make sure you discuss any questions you have with your health care provider. Document Revised: 03/30/2017 Document Reviewed: 11/28/2016 Elsevier Patient Education  [place]. Hypoglycemia Hypoglycemia is when the sugar (glucose) level in your blood is too low. Signs of low blood sugar may include:  Feeling: ? Hungry. ? Worried or nervous (anxious). ? Sweaty and clammy. ? Confused. ? Dizzy. ? Sleepy. ? Sick to your stomach (nauseous).  Having: ? A fast heartbeat. ? A headache. ? A change in your vision. ? Tingling or no feeling (numbness) around your mouth, lips, or tongue. ? Jerky movements that you cannot control (seizure).  Having trouble with: ? Moving (coordination). ? Sleeping. ? Passing out (fainting). ? Getting upset easily (irritability). Low blood sugar can happen to people who have diabetes and people who do not have diabetes. Low blood sugar can happen quickly, and it can be an emergency. Treating low blood sugar Low blood sugar is often treated by eating or drinking something sugary right away, such as:  Fruit juice, 4-6 oz (120-150 mL).  Regular soda (not diet soda), 4-6 oz (120-150 mL).  Low-fat milk, 4 oz (120 mL).  Several pieces of hard candy.  Sugar or honey, 1 Tbsp (15 mL). Treating low blood sugar if you have diabetes If you can think clearly and swallow safely, follow the 15:15 rule:  Take 15 grams of a fast-acting carb (carbohydrate). Talk with your doctor about how much you should take.  Always keep a source of fast-acting carb with you, such as: ? Sugar tablets (glucose  pills). Take 3-4 pills. ? 6-8 pieces of hard candy. ? 4-6 oz (120-150 mL) of fruit juice. ? 4-6 oz (120-150 mL) of regular (not diet) soda. ? 1 Tbsp (15 mL) honey or sugar.  Check your blood sugar 15 minutes after you take the carb.  If your blood sugar is still at or below 70 mg/dL (3.9 mmol/L), take 15 grams of a carb again.  If your blood sugar does not go above 70 mg/dL (3.9 mmol/L) after 3 tries, get help right away.  After your blood sugar goes back to normal, eat a meal or a snack within 1 hour.  Treating very low blood sugar If your blood sugar is at or below 54 mg/dL (3 mmol/L), you have very low blood sugar (severe hypoglycemia). This may also cause:  Passing out.  Jerky movements you cannot control (seizure).  Losing consciousness (coma). This is an emergency. Do not wait to see if the symptoms will go away. Get medical help right away. Call your local emergency services (911 in the [place] Do not drive yourself to the hospital. If you have very low blood sugar and you cannot eat or drink, you may need a glucagon shot (injection). A  family member or friend should learn how to check your blood sugar and how to give you a glucagon shot. Ask your doctor if you need to have a glucagon shot kit at home. Follow these instructions at home: General instructions  Take over-the-counter and prescription medicines only as told by your doctor.  Stay aware of your blood sugar as told by your doctor.  Limit alcohol intake to no more than 1 drink a day for nonpregnant women and 2 drinks a day for men. One drink equals 12 oz of beer (355 mL), 5 oz of wine (148 mL), or 1 oz of hard liquor (44 mL).  Keep all follow-up visits as told by your doctor. This is important. If you have diabetes:   Follow your diabetes care plan as told by your doctor. Make sure you: ? Know the signs of low blood sugar. ? Take your medicines as told. ? Follow your exercise and meal plan. ? Eat on time. Do  not skip meals. ? Check your blood sugar as often as told by your doctor. Always check it before and after exercise. ? Follow your sick day plan when you cannot eat or drink normally. Make this plan ahead of time with your doctor.  Share your diabetes care plan with: ? Your work or school. ? People you live with.  Check your pee (urine) for ketones: ? When you are sick. ? As told by your doctor.  Carry a card or wear jewelry that says you have diabetes. Contact a doctor if:  You have trouble keeping your blood sugar in your target range.  You have low blood sugar often. Get help right away if:  You still have symptoms after you eat or drink something sugary.  Your blood sugar is at or below 54 mg/dL (3 mmol/L).  You have jerky movements that you cannot control.  You pass out. These symptoms may be an emergency. Do not wait to see if the symptoms will go away. Get medical help right away. Call your local emergency services (911 in the [place] Do not drive yourself to the hospital. Summary  Hypoglycemia happens when the level of sugar (glucose) in your blood is too low.  Low blood sugar can happen to people who have diabetes and people who do not have diabetes. Low blood sugar can happen quickly, and it can be an emergency.  Make sure you know the signs of low blood sugar and know how to treat it.  Always keep a source of sugar (fast-acting carb) with you to treat low blood sugar. This information is not intended to replace advice given to you by your health care provider. Make sure you discuss any questions you have with your health care provider. Document Revised: 08/08/2018 Document Reviewed: 05/21/2015 Elsevier Patient Education  [place]. Hyperglycemia Hyperglycemia occurs when the level of sugar (glucose) in the blood is too high. Glucose is a type of sugar that provides the body's main source of energy. Certain hormones (insulin and glucagon) control the level  of glucose in the blood. Insulin lowers blood glucose, and glucagon increases blood glucose. Hyperglycemia can result from having too little insulin in the bloodstream, or from the body not responding normally to insulin. Hyperglycemia occurs most often in people who have diabetes (diabetes mellitus), but it can happen in people who do not have diabetes. It can develop quickly, and it can be life-threatening if it causes you to become severely dehydrated (diabetic ketoacidosis or hyperglycemic hyperosmolar  state). Severe hyperglycemia is a medical emergency. What are the causes? If you have diabetes, hyperglycemia may be caused by:  Diabetes medicine.  Medicines that increase blood glucose or affect your diabetes control.  Not eating enough, or not eating often enough.  Changes in physical activity level.  Being sick or having an infection. If you have prediabetes or undiagnosed diabetes:  Hyperglycemia may be caused by those conditions. If you do not have diabetes, hyperglycemia may be caused by:  Certain medicines, including steroid medicines, beta-blockers, epinephrine, and thiazide diuretics.  Stress.  Serious illness.  Surgery.  Diseases of the pancreas.  Infection. What increases the risk? Hyperglycemia is more likely to develop in people who have risk factors for diabetes, such as:  Having a family member with diabetes.  Having a gene for type 1 diabetes that is passed from parent to child (inherited).  Living in an area with cold weather conditions.  Exposure to certain viruses.  Certain conditions in which the body's disease-fighting (immune) system attacks itself (autoimmune disorders).  Being overweight or obese.  Having an inactive (sedentary) lifestyle.  Having been diagnosed with insulin resistance.  Having a history of prediabetes, gestational diabetes, or polycystic ovarian syndrome (PCOS).  Being of American-Indian, African-American,  Hispanic/Latino, or Asian/Pacific Islander descent. What are the signs or symptoms? Hyperglycemia may not cause any symptoms. If you do have symptoms, they may include early warning signs, such as:  Increased thirst.  Hunger.  Feeling very tired.  Needing to urinate more often than usual.  Blurry vision. Other symptoms may develop if hyperglycemia gets worse, such as:  Dry mouth.  Loss of appetite.  Fruity-smelling breath.  Weakness.  Unexpected or rapid weight gain or weight loss.  Tingling or numbness in the hands or feet.  Headache.  Skin that does not quickly return to normal after being lightly pinched and released (poor skin turgor).  Abdominal pain.  Cuts or bruises that are slow to heal. How is this diagnosed? Hyperglycemia is diagnosed with a blood test to measure your blood glucose level. This blood test is usually done while you are having symptoms. Your health care provider may also do a physical exam and review your medical history. You may have more tests to determine the cause of your hyperglycemia, such as:  A fasting blood glucose (FBG) test. You will not be allowed to eat (you will fast) for at least 8 hours before a blood sample is taken.  An A1c (hemoglobin A1c) blood test. This provides information about blood glucose control over the previous 2-3 months.  An oral glucose tolerance test (OGTT). This measures your blood glucose at two times: ? After fasting. This is your baseline blood glucose level. ? Two hours after drinking a beverage that contains glucose. How is this treated? Treatment depends on the cause of your hyperglycemia. Treatment may include:  Taking medicine to regulate your blood glucose levels. If you take insulin or other diabetes medicines, your medicine or dosage may be adjusted.  Lifestyle changes, such as exercising more, eating healthier foods, or losing weight.  Treating an illness or infection, if this caused your  hyperglycemia.  Checking your blood glucose more often.  Stopping or reducing steroid medicines, if these caused your hyperglycemia. If your hyperglycemia becomes severe and it results in hyperglycemic hyperosmolar state, you must be hospitalized and given IV fluids. Follow these instructions at home:  General instructions  Take over-the-counter and prescription medicines only as told by your health care provider.  Do not use any products that contain nicotine or tobacco, such as cigarettes and e-cigarettes. If you need help quitting, ask your health care provider.  Limit alcohol intake to no more than 1 drink per day for nonpregnant women and 2 drinks per day for men. One drink equals 12 oz of beer, 5 oz of wine, or 1 oz of hard liquor.  Learn to manage stress. If you need help with this, ask your health care provider.  Keep all follow-up visits as told by your health care provider. This is important. Eating and drinking   Maintain a healthy weight.  Exercise regularly, as directed by your health care provider.  Stay hydrated, especially when you exercise, get sick, or spend time in hot temperatures.  Eat healthy foods, such as: ? Lean proteins. ? Complex carbohydrates. ? Fresh fruits and vegetables. ? Low-fat dairy products. ? Healthy fats.  Drink enough fluid to keep your urine clear or pale yellow. If you have diabetes:  Make sure you know the symptoms of hyperglycemia.  Follow your diabetes management plan, as told by your health care provider. Make sure you: ? Take your insulin and medicines as directed. ? Follow your exercise plan. ? Follow your meal plan. Eat on time, and do not skip meals. ? Check your blood glucose as often as directed. Make sure to check your blood glucose before and after exercise. If you exercise longer or in a different way than usual, check your blood glucose more often. ? Follow your sick day plan whenever you cannot eat or drink  normally. Make this plan in advance with your health care provider.  Share your diabetes management plan with people in your workplace, school, and household.  Check your urine for ketones when you are ill and as told by your health care provider.  Carry a medical alert card or wear medical alert jewelry. Contact a health care provider if:  Your blood glucose is at or above 240 mg/dL (13.3 mmol/L) for 2 days in a row.  You have problems keeping your blood glucose in your target range.  You have frequent episodes of hyperglycemia. Get help right away if:  You have difficulty breathing.  You have a change in how you think, feel, or act (mental status).  You have nausea or vomiting that does not go away. These symptoms may represent a serious problem that is an emergency. Do not wait to see if the symptoms will go away. Get medical help right away. Call your local emergency services (911 in the [place] Do not drive yourself to the hospital. Summary  Hyperglycemia occurs when the level of sugar (glucose) in the blood is too high.  Hyperglycemia is diagnosed with a blood test to measure your blood glucose level. This blood test is usually done while you are having symptoms. Your health care provider may also do a physical exam and review your medical history.  If you have diabetes, follow your diabetes management plan as told by your health care provider.  Contact your health care provider if you have problems keeping your blood glucose in your target range. This information is not intended to replace advice given to you by your health care provider. Make sure you discuss any questions you have with your health care provider. Document Revised: 01/03/2016 Document Reviewed: 01/03/2016 Elsevier Patient Education  [place] With Diabetes  Foods with carbohydrates make your blood glucose level go up. Learning how to  count carbohydrates can help  you control your blood glucose levels. First, identify the foods you eat that contain carbohydrates. Then, using the Foods with Carbohydrates chart, determine about how much carbohydrates are in your meals and snacks. Make sure you are eating foods with fiber, protein, and healthy fat along with your carbohydrate foods. Foods with Carbohydrates The following table shows carbohydrate foods that have about 15 grams of carbohydrate each. Using measuring cups, spoons, or a food scale when you first begin learning about carbohydrate counting can help you learn about the portion sizes you typically eat. The following foods have 15 grams carbohydrate each:  Grains . 1 slice bread (1 ounce)  . 1 small tortilla (6-inch size)  .  large bagel (1 ounce)  . 1/3 cup pasta or rice (cooked)  .  hamburger or hot dog bun ( ounce)  .  cup cooked cereal  .  to  cup ready-to-eat cereal  . 2 taco shells (5-inch size) Fruit . 1 small fresh fruit ( to 1 cup)  .  medium banana  . 17 small grapes (3 ounces)  . 1 cup melon or berries  .  cup canned or frozen fruit  . 2 tablespoons dried fruit (blueberries, cherries, cranberries, raisins)  .  cup unsweetened fruit juice  Starchy Vegetables .  cup cooked beans, peas, corn, potatoes/sweet potatoes  .  large baked potato (3 ounces)  . 1 cup acorn or butternut squash  Snack Foods . 3 to 6 crackers  . 8 potato chips or 13 tortilla chips ( ounce to 1 ounce)  . 3 cups popped popcorn  Dairy . 3/4 cup (6 ounces) nonfat plain yogurt, or yogurt with sugar-free sweetener  . 1 cup milk  . 1 cup plain rice, soy, coconut or flavored almond milk Sweets and Desserts .  cup ice cream or frozen yogurt  . 1 tablespoon jam, jelly, pancake syrup, table sugar, or honey  . 2 tablespoons light pancake syrup  . 1 inch square of frosted cake or 2 inch square of unfrosted cake  . 2 small cookies (2/3 ounce each) or  large cookie  Sometimes you'll have to estimate  carbohydrate amounts if you don't know the exact recipe. One cup of mixed foods like soups can have 1 to 2 carbohydrate servings, while some casseroles might have 2 or more servings of carbohydrate. Foods that have less than 20 calories in each serving can be counted as "free" foods. Count 1 cup raw vegetables, or  cup cooked non-starchy vegetables as "free" foods. If you eat 3 or more servings at one meal, then count them as 1 carbohydrate serving.  Foods without Carbohydrates  Not all foods contain carbohydrates. Meat, some dairy, fats, non-starchy vegetables, and many beverages don't contain carbohydrate. So when you count carbohydrates, you can generally exclude chicken, pork, beef, fish, seafood, eggs, tofu, cheese, butter, sour cream, avocado, nuts, seeds, olives, mayonnaise, water, black coffee, unsweetened tea, and zero-calorie drinks. Vegetables with no or low carbohydrate include green beans, cauliflower, tomatoes, and onions. How much carbohydrate should I eat at each meal?  Carbohydrate counting can help you plan your meals and manage your weight. Following are some starting points for carbohydrate intake at each meal. Work with your registered dietitian nutritionist to find the best range that works for your blood glucose and weight.   To Lose Weight To Maintain Weight  Women 2 - 3 carb servings 3 - 4 carb servings  Men 3 - 4  carb servings 4 - 5 carb servings  Checking your blood glucose after meals will help you know if you need to adjust the timing, type, or number of carbohydrate servings in your meal plan. Achieve and keep a healthy body weight by balancing your food intake and physical activity.  Tips How should I plan my meals?  Plan for half the food on your plate to include non-starchy vegetables, like salad greens, broccoli, or carrots. Try to eat 3 to 5 servings of non-starchy vegetables every day. Have a protein food at each meal. Protein foods include chicken, fish, meat, eggs,  or beans (note that beans contain carbohydrate). These two food groups (non-starchy vegetables and proteins) are low in carbohydrate. If you fill up your plate with these foods, you will eat less carbohydrate but still fill up your stomach. Try to limit your carbohydrate portion to  of the plate.  What fats are healthiest to eat?  Diabetes increases risk for heart disease. To help protect your heart, eat more healthy fats, such as olive oil, nuts, and avocado. Eat less saturated fats like butter, cream, and high-fat meats, like bacon and sausage. Avoid trans fats, which are in all foods that list "partially hydrogenated oil" as an ingredient. What should I drink?  Choose drinks that are not sweetened with sugar. The healthiest choices are water, carbonated or seltzer waters, and tea and coffee without added sugars.  Sweet drinks will make your blood glucose go up very quickly. One serving of soda or energy drink is  cup. It is best to drink these beverages only if your blood glucose is low.  Artificially sweetened, or diet drinks, typically do not increase your blood glucose if they have zero calories in them. Read labels of beverages, as some diet drinks do have carbohydrate and will raise your blood glucose. Label Reading Tips Read Nutrition Facts labels to find out how many grams of carbohydrate are in a food you want to eat. Don't forget: sometimes serving sizes on the label aren't the same as how much food you are going to eat, so you may need to calculate how much carbohydrate is in the food you are serving yourself.   Carbohydrate Counting for People with Diabetes Sample 1-Day Menu  Breakfast  cup yogurt, low fat, low sugar (1 carbohydrate serving)   cup cereal, ready-to-eat, unsweetened (1 carbohydrate serving)  1 cup strawberries (1 carbohydrate serving)   cup almonds ( carbohydrate serving)  Lunch 1, 5 ounce can chunk light tuna  2 ounces cheese, low fat cheddar  6 whole wheat  crackers (1 carbohydrate serving)  1 small apple (1 carbohydrate servings)   cup carrots ( carbohydrate serving)   cup snap peas  1 cup 1% milk (1 carbohydrate serving)   Evening Meal Stir fry made with: 3 ounces chicken  1 cup brown rice (3 carbohydrate servings)   cup broccoli ( carbohydrate serving)   cup green beans   cup onions  1 tablespoon olive oil  2 tablespoons teriyaki sauce ( carbohydrate serving)  Evening Snack 1 extra small banana (1 carbohydrate serving)  1 tablespoon peanut butter   Carbohydrate Counting for People with Diabetes Vegan Sample 1-Day Menu  Breakfast 1 cup cooked oatmeal (2 carbohydrate servings)   cup blueberries (1 carbohydrate serving)  2 tablespoons flaxseeds  1 cup soymilk fortified with calcium and vitamin D  1 cup coffee  Lunch 2 slices whole wheat bread (2 carbohydrate servings)   cup baked tofu  cup lettuce  2 slices tomato  2 slices avocado   cup baby carrots ( carbohydrate serving)  1 orange (1 carbohydrate serving)  1 cup soymilk fortified with calcium and vitamin D   Evening Meal Burrito made with: 1 6-inch corn tortilla (1 carbohydrate serving)  1 cup refried vegetarian beans (2 carbohydrate servings)   cup chopped tomatoes   cup lettuce   cup salsa  1/3 cup brown rice (1 carbohydrate serving)  1 tablespoon olive oil for rice   cup zucchini   Evening Snack 6 small whole grain crackers (1 carbohydrate serving)  2 apricots ( carbohydrate serving)   cup unsalted peanuts ( carbohydrate serving)    Carbohydrate Counting for People with Diabetes Vegetarian (Lacto-Ovo) Sample 1-Day Menu  Breakfast 1 cup cooked oatmeal (2 carbohydrate servings)   cup blueberries (1 carbohydrate serving)  2 tablespoons flaxseeds  1 egg  1 cup 1% milk (1 carbohydrate serving)  1 cup coffee  Lunch 2 slices whole wheat bread (2 carbohydrate servings)  2 ounces low-fat cheese   cup lettuce  2 slices tomato  2 slices avocado    cup baby carrots ( carbohydrate serving)  1 orange (1 carbohydrate serving)  1 cup unsweetened tea  Evening Meal Burrito made with: 1 6-inch corn tortilla (1 carbohydrate serving)   cup refried vegetarian beans (1 carbohydrate serving)   cup tomatoes   cup lettuce   cup salsa  1/3 cup brown rice (1 carbohydrate serving)  1 tablespoon olive oil for rice   cup zucchini  1 cup 1% milk (1 carbohydrate serving)  Evening Snack 6 small whole grain crackers (1 carbohydrate serving)  2 apricots ( carbohydrate serving)   cup unsalted peanuts ( carbohydrate serving)    Copyright 2020  Academy of Nutrition and Dietetics. All rights reserved.  Using Nutrition Labels: Carbohydrate  . Serving Size  . Look at the serving size. All the information on the label is based on this portion. Randol Kern Per Container  . The number of servings contained in the package. . Guidelines for Carbohydrate  . Look at the total grams of carbohydrate in the serving size.  . 1 carbohydrate choice = 15 grams of carbohydrate. Range of Carbohydrate Grams Per Choice  Carbohydrate Grams/Choice Carbohydrate Choices  6-10   11-20 1  21-25 1  26-35 2  36-40 2  41-50 3  51-55 3  56-65 4  66-70 4  71-80 5    Copyright 2020  Academy of Nutrition and Dietetics. All rights reserved.

## 2019-09-22 NOTE — Consult Note (Signed)
Referring Physician: [facility]  Patient name: Morgan Beasley MRN: [id number] DOB: 25-Oct-1960 Sex: female  REASON FOR CONSULT: leg ulcers  HPI: Morgan Beasley is a 59 y.o. female, with a 69-monthhistory of multiple ulcerations in both legs.  The ulcers are confined to her calf region.  They are quite painful.  Patient also has a history of neuropathy and has numbness tingling and lack of proprioception in both lower extremities making it difficulty to ambulate.  She was admitted to the hospital with leg swelling and shortness of breath.  This was thought to be due to congestive heart failure.  She is currently undergoing diuresis as well as cardiac work-up.  She does smoke but says she is willing to quit.  She has had diabetes for several years.  Previous notes state that the patient complains of left foot pain but with me she was mainly complaining of pain in both lower extremities with burning and stinging consistent with neuropathy.  She did not really describe claudication.  Other medical problems include hypertension, chronic pancreatitis, sleep apnea, COPD, CKD 3 all of which have been stable.  She is on a statin.  I did not see in her medication list that she was on aspirin.  Per her history it "makes her pancreas act up".  Past Medical History:  Diagnosis Date  . Alcohol dependence ([facility]   . Anxiety   . [facility] ([facility]   . CHF (congestive heart failure) ([facility]   . Cigarette nicotine dependence   . Colon polyps   . COPD (chronic obstructive pulmonary disease) ([facility]   . Diabetes mellitus without complication ([facility]   . Diabetic neuropathy ([place]   . Elevated lipase   . Gout   . Hyperlipemia   . Hypertension   . Insomnia   . Lymphedema   . Marijuana use   . OSA treated with BiPAP   . PAD (peripheral artery disease) ([facility]   . Sleep apnea    wears BIPAP  . Ulcer of foot ([facility]    right   Past Surgical History:  Procedure Laterality Date  . ABDOMINAL  HYSTERECTOMY    [facility]  . MASTECTOMY Right April 2016    Family History  Problem Relation Age of Onset  . Diabetes Other   . Heart disease Other   . Breast cancer Maternal Grandmother   . Breast cancer Paternal Grandmother   . Stroke Son   . Colon cancer Neg Hx   . Esophageal cancer Neg Hx   . Rectal cancer Neg Hx   . Stomach cancer Neg Hx     SOCIAL HISTORY: Social History   Socioeconomic History  . Marital status: Single    Spouse name: Not on file  . Number of children: Not on file  . Years of education: Not on file  . Highest education level: Not on file  Occupational History  . Not on file  Tobacco Use  . Smoking status: Light Tobacco Smoker    Types: Cigarettes    Last attempt to quit: 06/04/2019    Years since quitting: 0.3  . Smokeless tobacco: Never Used  Substance and Sexual Activity  . Alcohol use: Not Currently    Comment: Beer - "on the weekends hanging out, maybe 2 or 3"   . Drug use: Not Currently    Types: Marijuana    Comment: last used 8 2020  . Sexual activity: Not on file  Other Topics Concern  . Not on file  Social History Narrative  . Not on file   Social Determinants of Health   Financial Resource Strain:   . Difficulty of Paying Living Expenses:   Food Insecurity:   . Worried About Charity fundraiser in the Last Year:   . Arboriculturist in the Last Year:   Transportation Needs:   . Film/video editor (Medical):   Marland Kitchen Lack of Transportation (Non-Medical):   Physical Activity:   . Days of Exercise per Week:   . Minutes of Exercise per Session:   Stress:   . Feeling of Stress :   Social Connections:   . Frequency of Communication with Friends and Family:   . Frequency of Social Gatherings with Friends and Family:   . Attends Religious Services:   . Active Member of Clubs or Organizations:   . Attends Archivist Meetings:   Marland Kitchen Marital Status:   Intimate Partner Violence:   . Fear of  Current or Ex-Partner:   . Emotionally Abused:   Marland Kitchen Physically Abused:   . Sexually Abused:     Allergies  Allergen Reactions  . Nsaids Other (See Comments)    Pancreatitis  . Tolmetin Other (See Comments)    Pancreatitis  . Aspirin Other (See Comments)    "Makes my pancreas act up"   . Tramadol Swelling    Current Facility-Administered Medications  Medication Dose Route Frequency Provider Last Rate Last Admin  . 0.9 %  sodium chloride infusion  250 mL Intravenous PRN Wilber Oliphant, MD      . acetaminophen (TYLENOL) tablet 650 mg  650 mg Oral Q4H PRN Wilber Oliphant, MD   650 mg at 09/21/19 2313  . albuterol (PROVENTIL) (2.5 MG/3ML) 0.083% nebulizer solution 3 mL  3 mL Inhalation Q6H PRN Wilber Oliphant, MD      . amitriptyline (ELAVIL) tablet 50 mg  50 mg Oral QHS Wilber Oliphant, MD   50 mg at 09/21/19 2144  . atorvastatin (LIPITOR) tablet 40 mg  40 mg Oral Daily Wilber Oliphant, MD   40 mg at 09/22/19 1036  . cephALEXin (KEFLEX) capsule 500 mg  500 mg Oral Q6H Wilber Oliphant, MD   500 mg at 09/22/19 1150  . enoxaparin (LOVENOX) injection 50 mg  0.5 mg/kg Subcutaneous Q24H Wilber Oliphant, MD   50 mg at 09/21/19 2144  . furosemide (LASIX) injection 40 mg  40 mg Intravenous BID Wilber Oliphant, MD   40 mg at 09/22/19 1552  . gabapentin (NEURONTIN) capsule 800 mg  800 mg Oral TID Skeet Simmer, RPH   800 mg at 09/22/19 1551  . HYDROcodone-acetaminophen (NORCO/VICODIN) 5-325 MG per tablet 1-2 tablet  1-2 tablet Oral Q6H PRN Brimage, Vondra, DO   2 tablet at 09/22/19 1150  . insulin aspart (novoLOG) injection 0-5 Units  0-5 Units Subcutaneous QHS Wilber Oliphant, MD   2 Units at 09/21/19 2314  . insulin aspart (novoLOG) injection 0-9 Units  0-9 Units Subcutaneous TID WC Wilber Oliphant, MD   2 Units at 09/22/19 1159  . insulin glargine (LANTUS) injection 40 Units  40 Units Subcutaneous Daily Wilber Oliphant, MD   40 Units at 09/22/19 [phone]  . lipase/protease/amylase (CREON) capsule 36,000 Units  36,000  Units Oral TID WC Wilber Oliphant, MD   36,000 Units at 09/22/19 1150  . losartan (COZAAR) tablet 50 mg  50 mg Oral  Daily Wilber Oliphant, MD   50 mg at 09/22/19 1036  . mometasone-formoterol (DULERA) 100-5 MCG/ACT inhaler 2 puff  2 puff Inhalation BID Wilber Oliphant, MD   2 puff at 09/22/19 0831  . ondansetron (ZOFRAN) injection 4 mg  4 mg Intravenous Q6H PRN Wilber Oliphant, MD      . sodium chloride flush (NS) 0.9 % injection 3 mL  3 mL Intravenous Q12H Wilber Oliphant, MD   3 mL at 09/22/19 1552  . sodium chloride flush (NS) 0.9 % injection 3 mL  3 mL Intravenous PRN Wilber Oliphant, MD        ROS:   General:  No weight loss, Fever, chills  HEENT: No recent headaches, no nasal bleeding, no visual changes, no sore throat  Neurologic: No dizziness, blackouts, seizures. No recent symptoms of stroke or mini- stroke. No recent episodes of slurred speech, or temporary blindness.  Cardiac: No recent episodes of chest pain/pressure, no shortness of breath at rest.  + shortness of breath with exertion.  Denies history of atrial fibrillation or irregular heartbeat  Vascular: No history of rest pain in feet.  No history of claudication.  No history of non-healing ulcer, No history of DVT   Pulmonary: No home oxygen, no productive cough, no hemoptysis,  No asthma or wheezing  Musculoskeletal:  _0  Arthritis, _1  Low back pain,  _2  Joint pain  Hematologic:No history of hypercoagulable state.  No history of easy bleeding.  No history of anemia  Gastrointestinal: No hematochezia or melena,  No gastroesophageal reflux, no trouble swallowing  Urinary: _3  chronic Kidney disease, _4  on HD - _5  MWF or _6  TTHS, _7  Burning with urination, _8  Frequent urination, _9  Difficulty urinating;   Skin: No rashes  Psychological: No history of anxiety,  No history of depression   Physical Examination  Vitals:   09/21/19 1958 09/21/19 2134 09/22/19 0619 09/22/19 1025  BP: 132/83  (!) 146/93 (!) 154/89  Pulse:  (!) 103  100 100  Resp: _10 Temp: 98.2 F (36.8 C)  97.7 F (36.5 C) 98.2 F (36.8 C)  TempSrc: Oral  Oral Oral  SpO2: 92% 94% 90% 100%  Weight: 99.4 kg       Body mass index is 36.47 kg/m.  General:  Alert and oriented, no acute distress HEENT: Normal Neck: No  JVD Cardiac: Regular Rate and Rhythm Abdomen: Soft, non-tender, non-distended, obese Skin: No rash, multiple scattered well-circumscribed ulcerations primarily on the calf bilateral legs these vary in size from 2 cm diameter to 4 cm diameter they are all covered with dry eschar which is brown in color.  There is no fluctuance or erythema.  There is no drainage.  There are no ulcerations or wounds on the toe or ankle. Extremity Pulses:  2+ radial, brachial, femoral, absent popliteal dorsalis pedis, posterior tibial pulses bilaterally Musculoskeletal: No deformity or edema  Neurologic: Upper and lower extremity motor 5/5 and symmetric  DATA:  Patient had bilateral ABIs performed which were 0.5 bilaterally  ASSESSMENT: Multiple leg ulcers not consistent with usual appearance of arterial leg ulcers.  Most likely will need skin biopsy of 1 of these ulcers for further diagnostic information.  She does have an exam consistent with bilateral superficial femoral artery occlusions or possibly tibial disease as well.  Usually even with a superficial femoral artery occlusion there should be adequate perfusion for healing and calf ulcer.  However since these have been present for several months we should at least consider improving her arterial perfusion to assist in wound healing   PLAN: 1.  Recommend dermatology or general surgery consult for wound biopsy.  Differential diagnosis includes insect bites with superinfection, pyoderma gangrenosum, nonuremic calciphylaxis or other rare disorders creating ulcerations such as lupus or other vasculitis.  ESR is pending.  Would also send off CRP.  2.  We will consider aortogram lower  extremity runoff possible intervention in the next few days after her congestive heart failure is improved.  3.  We will need to investigate further whether or not she is a candidate for antiplatelet agent has any percutaneous intervention would need to be accompanied by aspirin at the minimum and potentially Plavix as well.  If she is unable to take any antiplatelet agents because of her pancreas the durability of any intervention will significantly be limited.  We will follow along with you to make determination on the best time to perform her arteriogram.   Ruta Hinds, MD [facility] [place]: [phone] Pager: [phone]

## 2019-09-22 NOTE — Evaluation (Signed)
Physical Therapy Evaluation Patient Details Name: Morgan Beasley MRN: [id number] DOB: November 21, 1960 Today's Date: 09/22/2019   History of Present Illness  Morgan Beasley is a 59 y.o. female who presented with acute SOB x 2 days and bilateral lower extremity edema and was admitted for likely CHF exacerbation. 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 Comes from home where she lives alone, but her daughter checks in on her multiple times a day; Lives in an apt with one step to enter; uses a Radiation protection practitioner RW for amb household distances at baseline; Presents to PT with bil LE pain and edema limiting activity tolerance, gait and balance dysfunction; Hesitant to participate due to pain, but ultimately able to walk to bathroom with RW and minguard assist;  Pt currently with functional limitations due to the deficits listed below (see PT Problem List). Pt will benefit from skilled PT to increase their independence and safety with mobility to allow discharge to the venue listed below.       Follow Up Recommendations Home health PT    Equipment Recommendations  None recommended by PT(I believe she already has RW and 3in1)    Recommendations for Other Services       Precautions / Restrictions Precautions Precautions: Fall Restrictions Weight Bearing Restrictions: No      Mobility  Bed Mobility Overal bed mobility: Needs Assistance Bed Mobility: Sit to Supine;Supine to Sit     Supine to sit: Supervision Sit to supine: Min assist   General bed mobility comments: Slow moving, and requesting no help getting up to EOB; Min assist getting to optimal poistioning once back in bed  Transfers Overall transfer level: Needs assistance Equipment used: None;Rolling walker (2 wheeled) Transfers: Sit to/from Marketing executive  Transfers Sit to Stand: Min guard Stand pivot transfers: Min guard Squat pivot transfers: Min guard     General transfer comment: Performed squat pivot transfer bed to recliner on her L; Then stood from recliner to [place] as well as from 3in1 to [employer]  Ambulation/Gait Ambulation/Gait assistance: Min guard(with and without physical contact) Gait Distance (Feet): 25 Feet(recliner>bathroom>sink>back to bed) Assistive device: Rolling walker (2 wheeled) Gait Pattern/deviations: Step-through pattern;Decreased step length - right;Decreased step length - left;Decreased stride length     General Gait Details: Cues to self-monitor for activity tolerance; painful bil LEs  Stairs            Wheelchair Mobility    Modified Rankin (Stroke Patients Only)       Balance Overall balance assessment: Needs assistance Sitting-balance support: Bilateral upper extremity supported;Feet supported Sitting balance-Leahy Scale: Fair     Standing balance support: Bilateral upper extremity supported;No upper extremity supported Standing balance-Leahy Scale: Poor Standing balance comment: With standing mobility patient needs ex support.                              Pertinent Vitals/Pain Pain Assessment: 0-10 Pain Score: 8  Pain Location: B LE Pain Descriptors / Indicators: Burning;Aching;Constant;Throbbing Pain Intervention(s): Monitored during session;RN gave pain meds during session    [place] expects to be discharged to:: Private residence Living Arrangements: Alone;Other (Comment)(Daughter comes and checks on her 3-4 times a day) Available Help at Discharge: Family Type of Home: Apartment Home Access: Stairs to enter Entrance Stairs-Rails: Can reach both(no rail  but can use porch to help) Entrance Stairs-Number of Steps: 1 Home Layout: One level Home Equipment: Helen - 4 wheels;Bedside commode      Prior Function Level of Independence: Needs assistance    Gait / Transfers Assistance Needed: Uses rollator for short distances  ADL's / Homemaking Assistance Needed: Daughter assists with ADLs like showering. Patient states she can toilet transfer on own        Hand Dominance   Dominant Hand: Right    Extremity/Trunk Assessment   Upper Extremity Assessment Upper Extremity Assessment: Defer to OT evaluation    Lower Extremity Assessment Lower Extremity Assessment: Generalized weakness(Bil LE edema)       Communication   Communication: No difficulties  Cognition Arousal/Alertness: Awake/alert Behavior During Therapy: Anxious;WFL for tasks assessed/performed Overall Cognitive Status: No family/caregiver present to determine baseline cognitive functioning Area of Impairment: Following commands                       Following Commands: Follows multi-step commands with increased time       General Comments: Patient anxious and very focused on pain during session.        General Comments General comments (skin integrity, edema, etc.): O2 sats in high 90s with activity on room air    Exercises     Assessment/Plan    PT Assessment Patient needs continued PT services  PT Problem List Decreased strength;Decreased range of motion;Decreased activity tolerance;Decreased balance;Decreased mobility;Decreased coordination;Decreased knowledge of use of DME;Decreased safety awareness;Decreased knowledge of precautions;Cardiopulmonary status limiting activity;Pain       PT Treatment Interventions DME instruction;Gait training;Stair training;Functional mobility training;Therapeutic activities;Therapeutic exercise;Balance training;Patient/family education    PT Goals (Current goals can be found in the Care Plan section)  Acute Rehab PT Goals Patient Stated Goal: to reduce pain PT Goal Formulation: With patient Time For Goal Achievement: 10/06/19 Potential to Achieve Goals: Good    Frequency Min 3X/week   Barriers to  discharge        Co-evaluation PT/OT/SLP Co-Evaluation/Treatment: Yes Reason for Co-Treatment: Other (comment)(Pt-centered decision based on pt's activity tolerance) PT goals addressed during session: Mobility/safety with mobility OT goals addressed during session: ADL's and self-care       AM-PAC PT "6 Clicks" Mobility  Outcome Measure Help needed turning from your back to your side while in a flat bed without using bedrails?: None Help needed moving from lying on your back to sitting on the side of a flat bed without using bedrails?: None Help needed moving to and from a bed to a chair (including a wheelchair)?: None Help needed standing up from a chair using your arms (e.g., wheelchair or bedside chair)?: A Little Help needed to walk in hospital room?: A Little Help needed climbing 3-5 steps with a railing? : A Lot 6 Click Score: 20    End of Session Equipment Utilized During Treatment: Gait belt Activity Tolerance: Patient limited by pain Patient left: in bed;with call bell/phone within reach;with bed alarm set Nurse Communication: Mobility status PT Visit Diagnosis: Other abnormalities of gait and mobility (R26.89);Pain Pain - Right/Left: (bilateral) Pain - part of body: Leg    Time: 1029-1105 PT Time Calculation (min) (ACUTE ONLY): 36 min   Charges:   PT Evaluation $PT Eval Moderate Complexity: 1 Mod          Roney [place], [place]  Acute Rehabilitation Services Pager [phone] Office [phone]   Colletta [place] 09/22/2019, 1:39 PM

## 2019-09-22 NOTE — Progress Notes (Signed)
Inpatient Diabetes Program Recommendations  AACE/ADA: New Consensus Statement on Inpatient Glycemic Control (2015)  Target Ranges:  Prepandial:   less than 140 mg/dL      Peak postprandial:   less than 180 mg/dL (1-2 hours)      Critically ill patients:  140 - 180 mg/dL   Lab Results  Component Value Date   GLUCAP 172 (H) 09/22/2019   HGBA1C 11.7 (H) 09/22/2019    Review of Glycemic Control Results for LAYALI, OKABE (MRN [id number]) as of 09/22/2019 11:44  Ref. Range 09/21/2019 22:41 09/22/2019 07:01 09/22/2019 11:30  Glucose-Capillary Latest Ref Range: 70 - 99 mg/dL 235 (H) 152 (H) 172 (H)   Diabetes history: Type 2 DM Outpatient Diabetes medications: Amaryl 8 mg QAM, Humalog 14 units TID, Lantus 80 units QD Current orders for Inpatient glycemic control: Lantus 40 units QD, Novolog 0-9 units TID, Novolog 0-5 units QHS  Inpatient Diabetes Program Recommendations:    Spoke with patient regarding outpatient diabetes management. Patient admits to frequently missing injections, struggles to remember. Home health nurse tries remind her daily, but still forgets. States, "I want to better and have been trying to watch my meals." Reviewed patient's current A1c of 11.7%. Explained what a A1c is and what it measures. Also reviewed goal A1c with patient, importance of good glucose control @ home, and blood sugar goals. Reviewed patho of DM, need for insulin, impact of missing injections, vascular changes and commorbidities.  Admits to missing injections. Doesn't give consistent answers on frequency. However, wants home health to remind her, will attach goals and tips to help with remembering injections to DC summary. Reminded patient that home health nurse, although there everyday, cannot observe each injection so it may be wise to develop ways of remembering each injection. Reviewed goal setting and alternatives to remembering injections.  Has a meter and reports checking one time per day.  Reviewed the importance of increasing frequency and when to call MD.  Placed consult for case management and dietitian.  Patient has no further questions at this time.   Thanks, Bronson Curb, MSN, RNC-OB Diabetes Coordinator [phone] (8a-5p)

## 2019-09-22 NOTE — Progress Notes (Signed)
*   Echocardiogram 2D Echocardiogram has been performed.  Morgan Beasley 09/22/2019, 10:09 AM

## 2019-09-22 NOTE — Progress Notes (Signed)
ABI's have been completed. Preliminary results can be found in CV Proc through chart review.   09/22/19 2:06 PM Morgan Beasley RVT

## 2019-09-22 NOTE — TOC Initial Note (Addendum)
Transition of Care [facility]) - Initial/Assessment Note    Patient Details  Name: Morgan Beasley MRN: [id number] Date of Birth: [birth year]  Transition of Care [facility]) CM/SW Contact:    Bartholomew Crews, RN Phone Number: [phone] 09/22/2019, 2:41 PM  Clinical Narrative:                  Spoke with patient at the bedside. She was talking with her daughter on the phone and put her daughter on speaker phone in order to participate in the conversation.   Patient has a walker at home. Daughter reports multiple falls. Noted PT and OT evaluations pending.  Patient has a scale and reports that she weighs herself daily at least on most days. Verbalized understanding to weigh herself at the same time, same scale, and same clothing. Reviewed symptoms and when to call the doctor using red light green light example.   Patient also states that she has a cpap at home.   Discussed need for mail in prescriptions. Patient stated that what she actually needs are her medications bubble packed. Patient states that her medications now go to [facility] who charges for delivery. NCM spoke with representative at [facility] who explained that they do have free delivery. Discussed patient need for bubble packs, and a note was made on the chart.   Verified PCP in Epic as correct. PCP office provides transportation to and from appointments, and patient can also access transportation through her medicare plan benefit.   TOC team following for transition needs.   Expected Discharge Plan: [place] Barriers to Discharge: Continued Medical Work up   Patient Goals and CMS Choice Patient states their goals for this hospitalization and ongoing recovery are:: return home CMS Medicare.gov Compare Post Acute Care list provided to:: Patient Choice offered to / list presented to : Patient  Expected Discharge Plan and Services Expected Discharge Plan: [place] In-house  Referral: Clinical Social Work Discharge Planning Services: CM Consult Post Acute Care Choice: Durable Medical Equipment Living arrangements for the past 2 months: Apartment                                      Prior Living Arrangements/Services Living arrangements for the past 2 months: Apartment Lives with:: Self, Adult Children Patient language and need for interpreter reviewed:: Yes        Need for Family Participation in Patient Care: Yes (Comment) Care giver support system in place?: Yes (comment) Current home services: DME, Homehealth aide([facility] 2.5 hrs/7 days week) Criminal Activity/Legal Involvement Pertinent to Current Situation/Hospitalization: No - Comment as needed  Activities of Daily Living Home Assistive Devices/Equipment: Walker (specify type), CBG Meter ADL Screening (condition at time of admission) Patient's cognitive ability adequate to safely complete daily activities?: Yes Is the patient deaf or have difficulty hearing?: No Does the patient have difficulty seeing, even when wearing glasses/contacts?: No Does the patient have difficulty concentrating, remembering, or making decisions?: No Patient able to express need for assistance with ADLs?: Yes Does the patient have difficulty dressing or bathing?: No Independently performs ADLs?: Yes (appropriate for developmental age) Does the patient have difficulty walking or climbing stairs?: Yes Weakness of Legs: Both Weakness of Arms/Hands: None  Permission Sought/Granted Permission sought to share information with : Family Supports Permission granted to share information with : Yes, Verbal Permission Granted  Share Information  with NAME: Ottie Glazier     Permission granted to share info w Relationship: daughter  Permission granted to share info w Contact Information: [phone]  Emotional Assessment Appearance:: Appears stated age Attitude/Demeanor/Rapport: Engaged Affect (typically observed):  Accepting Orientation: : Oriented to Self, Oriented to  Time, Oriented to Place, Oriented to Situation Alcohol / Substance Use: Not Applicable Psych Involvement: No (comment)  Admission diagnosis:  Dyspnea [R06.00] Bilateral leg edema [R60.0] Diabetic peripheral neuropathy associated with type 2 diabetes mellitus ([place]) [E11.42] Patient Active Problem List   Diagnosis Date Noted  . Dyspnea 09/21/2019  . Bronchitis 02/25/2019  . Diabetes mellitus ([place]) 02/25/2019  . Peripheral neuropathy 02/25/2019  . COPD (chronic obstructive pulmonary disease) ([place]) 02/25/2019  . Malignant neoplasm of upper-outer quadrant of right breast in female, estrogen receptor positive ([place]) 01/08/2019   PCP:  Sonia Side., FNP Pharmacy:   [facility] [address] [place] [address] Phone: [phone] Fax: [place], [place] - [address] [place] [address] Phone: [phone] Fax: [phone]     Social Determinants of Health (SDOH) Interventions    Readmission Risk Interventions No flowsheet data found.

## 2019-09-22 NOTE — Progress Notes (Addendum)
Family Medicine Teaching Service Daily Progress Note Intern Pager: [phone]  Patient name: Morgan Beasley Medical record number: [id number] Date of birth: [birth year] Age: 59 y.o. Gender: female  Primary Care Provider: Sonia Side., FNP Consultants: None Code Status: DNR  Pt Overview and Major Events to Date:  09/21/19: Admitted  Assessment and Plan: Morgan Beasley is a 59 y.o. female who presented with acute SOB x 2 days and BLEE and was admitted for likely CHF exacerbation. .  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 CXR indicated vascular congestion with likely bibasilar edema. BNP 733.9.  Loss on admission have resolved.  Will obtain repeat chest x-ray.  Continue IV diuresis.  Patient reports improvement in her breathing has significant lower extremity pain L>R.  DVTs ruled out via Dopplers yesterday. - Continuous cardiac monitoring  - Vitals per unit routine, O2 > 88%, OOB w/ assistance  - Echocardiogram, AM EKG  - AM Labs CMP, CBC - Daily weights, strict IO  - repeat CXR   Foot Pain Left foot pain, especially with walking.  Left foot XR in ED with diffuse swelling but no bony abnormality appreciated. Concern for PAD so will obtain Vasc US ABIs.  DVT ruled out with [place] on admission.  L>R warmth has resolved, with Keflex. Concern for cellulitis remains. No known hx of gout, but can not be excluded. Inflammatory arthritis, complex regional pain disease also in differential.    - continue to monitor daily  - Continue Keflex 500 mg QID x 5 days, 20 doses (5/23-5/28) -  Obtain ABIs, consult Vascular surgery referral if necessary  - Obtain uric acid, ESR  - Norco prn for pain   Rash vs ?ulcerative  wounds Patient with ?ulcerations on bilateral lower extremities.  Patient states the lesions have been present for the past 3 months. States she has seen her PCP for this in the past. Risk factors include hx of PAD and uncontrolled diabetes and tobacco use. Will obtain ESR and ABIs.   - FU ABIs  - Obtain PCP records    COPD  Stable. Home medications include. Symbicort 2 puffs twice daily, albuterol as needed.   Continue home medications. Ruthe Mannan as hospital formulary -Continue home albuterol as needed  Insulin depended T2DM, uncontrolled  Patient's diabetes regimen at home is Lantus 80 units, Humalog 14 units AC, glimepiride 8 mg daily with breakfast. Glucose on admission 338 with significant glucosuria >500. Fastuing glucose 152 this morning.  A1c 11.7 this admission.  - halve home lantus, 40 units in the morning  - sensitive sliding scale AC  - holding home glimiperide  - CBG QID, TID AC + QHS correction  --Consider adding SLGT-2 inhibitor (Jardiance, ect...)   Diabetic polyneuropathy No ulcers appreciated on foot exam.  Patient does have thickened heels but no obvious cracks. -Continue home gabapentin 800 mg 3 times daily  CKD III Patient's GFR consistently around or below 60.  On admission, creatinine is 1.16 which appears to be her baseline since about 2016.  Patient with >300 protein and > 500 glucose.  Rare bacteria on micro and patient denies dysuria.  Also moderate hemoglobin on dipstick.  Creatine 1.24 today.  - avoid nephrotoxic agents  -  monitor Cr with daily BMP   HTN  HLD  BP 154/89. Cardiac medications at home include diltiazem XR 120 mg daily, losartan 50 mg daily, potassium as needed, Lasix 40 mg daily - continue home atorvastatin, losartan - holding home lasix, diltiazem   PVD Patient on Plavix, though per notes, has not taken for several months.  - consider restarting for primary v secondary prevention (follow up PCP notes)  -Obtain ABIs,   Overactive  bladder Patient takes Myrbetriq's 50 mg daily - will hold in setting of diuresis  - Purewick okay at this time   Chronic calcific pancreatitis Follows with GI outpatient. - Continue Creon with meals and with snacks  OSA Patient uses CPAP at home nightly -CPAP ordered  FEN/GI: Heart healthy carb modified diet, 1200 mL fluid restriction, replete electrolytes as needed PPx: Lovenox  Disposition: likely home   Subjective:  Patient with significant for extremity pain to touch worse with walking.  Denies shortness of breath and chest pain.  Objective: Temp:  [97.7 F (36.5 C)-98.6 F (37 C)] 98.2 F (36.8 C) (05/24 1025) Pulse Rate:  [100-103] 100 (05/24 1025) Resp:  [17-22] 20 (05/24 1025) BP: (132-154)/(83-100) 154/89 (05/24 1025) SpO2:  [90 %-100 %] 100 % (05/24 1025) Weight:  [95 kg-99.4 kg] 99.4 kg (05/23 1958) Physical Exam: General: uncomfortable appearing female in no acute distress  Cardiovascular: regular rate and rhythm, no murmurs appreciated  Respiratory: clear to ascultation bilaterally, no increased work of breathing on room air  Abdomen: soft, non-tender Extremities: ?ulcerative lesions on bilateral LE, tenderness LLE, non-pitting edema   Laboratory: Recent Labs  Lab 09/21/19 1449  WBC 8.8  HGB 12.7  HCT 41.1  PLT 370   Recent Labs  Lab 09/21/19 1449 09/22/19 0503  NA 138 140  K 4.0 3.5  CL 99 104  CO2 25 28  BUN 8 11  CREATININE 1.16* 1.24*  CALCIUM 8.6* 8.5*  PROT 5.9*  --   BILITOT 0.7  --   ALKPHOS 122  --   ALT 20  --   AST 20  --   GLUCOSE 338* 170*      Imaging/Diagnostic Tests: DG Chest 2 View  Result Date: 09/21/2019 CLINICAL DATA:  Shortness of breath and peripheral swelling EXAM: CHEST - 2 VIEW COMPARISON:  02/24/2019 FINDINGS: Cardiac shadow is enlarged. Vascular congestion is noted with bibasilar airspace opacities likely representing edema although infectious etiology cannot be totally excluded. No bony abnormality is  noted. No sizable effusion is noted. IMPRESSION: Vascular congestion with likely bibasilar edema. Electronically Signed   By: Inez Catalina M.D.   On: 09/21/2019 16:31   DG Foot 2 Views Left  Result Date: 09/21/2019 CLINICAL DATA:  Bilateral leg swelling and left foot pain. EXAM: LEFT FOOT - 2 VIEW COMPARISON:  None. FINDINGS: There is no evidence of an acute fracture or dislocation. Chronic deformities are seen involving the distal left tibia and distal left fibula. There is no evidence of arthropathy. A large plantar calcaneal spur is noted. Marked severity diffuse soft tissue swelling is seen. IMPRESSION: Marked severity diffuse soft tissue swelling without evidence of acute osseous abnormality. Electronically Signed   By: Virgina Norfolk M.D.   On: 09/21/2019 16:16   VAS [place] ABI WITH/WO TBI  Result Date: 09/22/2019 LOWER EXTREMITY DOPPLER STUDY Indications: Rest pain, and ulceration. High Risk Factors: Diabetes.  Limitations: Today's exam was limited due to patient positioning, an open wound,  patient intolerant to cuff pressure and involuntary patient              movement. Comparison Study: No prior studies. Performing Technologist: Carlos Levering Rvt  Examination Guidelines: A complete evaluation includes at minimum, Doppler waveform signals and systolic blood pressure reading at the level of bilateral brachial, anterior tibial, and posterior tibial arteries, when vessel segments are accessible. Bilateral testing is considered an integral part of a complete examination. Photoelectric Plethysmograph (PPG) waveforms and toe systolic pressure readings are included as required and additional duplex testing as needed. Limited examinations for reoccurring indications may be performed as noted.  ABI Findings: +---------+------------------+-----+----------+--------------+ Right    Rt Pressure (mmHg)IndexWaveform  Comment        +---------+------------------+-----+----------+--------------+  Brachial                                  Restricted arm +---------+------------------+-----+----------+--------------+ PTA      68                0.45 monophasic               +---------+------------------+-----+----------+--------------+ DP       67                0.44 monophasic               +---------+------------------+-----+----------+--------------+ Great Toe26                0.17                          +---------+------------------+-----+----------+--------------+ +--------+------------------+-----+----------+-------+ Left    Lt Pressure (mmHg)IndexWaveform  Comment +--------+------------------+-----+----------+-------+ [id number]                    triphasic         +--------+------------------+-----+----------+-------+ PTA     74                0.49 monophasic        +--------+------------------+-----+----------+-------+ DP      61                0.40 monophasic        +--------+------------------+-----+----------+-------+ +-------+-----------+-----------+------------+------------+ ABI/TBIToday's ABIToday's TBIPrevious ABIPrevious TBI +-------+-----------+-----------+------------+------------+ Right  0.45       0.17                                +-------+-----------+-----------+------------+------------+ Left   0.49                                           +-------+-----------+-----------+------------+------------+  Summary: Right: Resting right ankle-brachial index indicates severe right lower extremity arterial disease. The right toe-brachial index is abnormal. Left: Resting left ankle-brachial index indicates severe left lower extremity arterial disease. Unable to obtain TBI due to low amplitude waveforms.  *See table(s) above for measurements and observations.     Preliminary    ECHOCARDIOGRAM COMPLETE  Result Date: 09/22/2019    ECHOCARDIOGRAM REPORT   Patient Name:   DANIE HANNIG [facility] Date of Exam: 09/22/2019 Medical Rec #:   [id number]            Height:       65.0 in Accession #:  [id number]           Weight:       219.1 lb Date of Birth:  11/04/1960             BSA:          2.056 m Patient Age:    59 years             BP:           146/93 mmHg Patient Gender: F                    HR:           100 bpm. Exam Location:  Inpatient Procedure: 2D Echo Indications:    786.09 dyspnea  History:        Patient has no prior history of Echocardiogram examinations.                 CHF, COPD; Risk Factors:Diabetes, Hypertension, Dyslipidemia and                 Current Smoker. Alcohol dependence hx.  Sonographer:    Vijay Shankar RDCS (AE) Referring Phys: [id number] MARSHALL L CHAM[place] IMPRESSIONS  1. Left ventricular ejection fraction, by estimation, is 50 to 55%. The left ventricle has low normal function. The left ventricle has no regional wall motion abnormalities. Left ventricular diastolic parameters are consistent with Grade II diastolic dysfunction (pseudonormalization).  2. Right ventricular systolic function is normal. The right ventricular size is normal.  3. The mitral valve is normal in structure. Trivial mitral valve regurgitation. No evidence of mitral stenosis.  4. The aortic valve is normal in structure. Aortic valve regurgitation is not visualized. No aortic stenosis is present. FINDINGS  Left Ventricle: Left ventricular ejection fraction, by estimation, is 50 to 55%. The left ventricle has low normal function. The left ventricle has no regional wall motion abnormalities. The left ventricular internal cavity size was normal in size. There is borderline left ventricular hypertrophy. Left ventricular diastolic parameters are consistent with Grade II diastolic dysfunction (pseudonormalization). Right Ventricle: The right ventricular size is normal. No increase in right ventricular wall thickness. Right ventricular systolic function is normal. Left Atrium: Left atrial size was normal in size. Right Atrium: Right atrial size was normal  in size. Pericardium: There is no evidence of pericardial effusion. Mitral Valve: The mitral valve is normal in structure. Trivial mitral valve regurgitation. No evidence of mitral valve stenosis. Tricuspid Valve: The tricuspid valve is normal in structure. Tricuspid valve regurgitation is trivial. No evidence of tricuspid stenosis. Aortic Valve: The aortic valve is normal in structure. Aortic valve regurgitation is not visualized. No aortic stenosis is present. Pulmonic Valve: The pulmonic valve was grossly normal. Pulmonic valve regurgitation is trivial. No evidence of pulmonic stenosis. Aorta: The aortic root and ascending aorta are structurally normal, with no evidence of dilitation. IAS/Shunts: The atrial septum is grossly normal.  LEFT VENTRICLE PLAX 2D LVIDd:         4.70 cm  Diastology LVIDs:         4.40 cm  LV e' lateral:   7.51 cm/s LV PW:         1.10 cm  LV E/e' lateral: 9.9 LV IVS:        1.30 cm  LV e' medial:    5.22 cm/s LVOT diam:     2.00 cm  LV E/e' medial:  14.3 LV SV:           30 LV SV Index:   14 LVOT Area:     3.14 cm  LEFT ATRIUM           Index      RIGHT ATRIUM           Index LA diam:      3.70 cm 1.80 cm/m RA Area:     15.20 cm LA Vol (A4C): 17.0 ml 8.27 ml/m RA Volume:   35.20 ml  17.12 ml/m  AORTIC VALVE LVOT Vmax:   60.10 cm/s LVOT Vmean:  41.100 cm/s LVOT VTI:    0.095 m  AORTA Ao Root diam: 2.60 cm MITRAL VALVE MV Area (PHT): 4.31 cm    SHUNTS MV Decel Time: 176 msec    Systemic VTI:  0.09 m MV E velocity: 74.60 cm/s  Systemic Diam: 2.00 cm Philip Nahser MD Electronically signed by Philip Nahser MD Signature Date/Time: 09/22/2019/11:45:57 AM    Final    VAS US LOWER EXTREMITY VENOUS (DVT) (ONLY [facility] & [facility] 7a-7p)  Result Date: 09/22/2019  Lower Venous DVTStudy Indications: Swelling, Pain, and SOB.  Limitations: Body habitus and extreme swelling. Comparison Study: No prior study on file for comparison Performing Technologist: Candace Kanady RVS  Examination Guidelines: A complete  evaluation includes B-mode imaging, spectral Doppler, color Doppler, and power Doppler as needed of all accessible portions of each vessel. Bilateral testing is considered an integral part of a complete examination. Limited examinations for reoccurring indications may be performed as noted. The reflux portion of the exam is performed with the patient in reverse Trendelenburg.  +---------+---------------+---------+-----------+----------+--------------+ RIGHT    CompressibilityPhasicitySpontaneityPropertiesThrombus Aging +---------+---------------+---------+-----------+----------+--------------+ CFV      Full                                         pulsatile      +---------+---------------+---------+-----------+----------+--------------+ SFJ      Full                                                        +---------+---------------+---------+-----------+----------+--------------+ FV Prox  Full                                                        +---------+---------------+---------+-----------+----------+--------------+ FV Mid   Full                                                        +---------+---------------+---------+-----------+----------+--------------+ FV DistalFull                                                        +---------+---------------+---------+-----------+----------+--------------+ PFV      Full                                                        +---------+---------------+---------+-----------+----------+--------------+   POP      Full                                         pulsatile      +---------+---------------+---------+-----------+----------+--------------+ PTV      Full                                                        +---------+---------------+---------+-----------+----------+--------------+ PERO     Full                                                         +---------+---------------+---------+-----------+----------+--------------+   +---------+---------------+---------+-----------+----------+--------------+ LEFT     CompressibilityPhasicitySpontaneityPropertiesThrombus Aging +---------+---------------+---------+-----------+----------+--------------+ CFV      Full                                         pulsatile      +---------+---------------+---------+-----------+----------+--------------+ SFJ      Full                                                        +---------+---------------+---------+-----------+----------+--------------+ FV Prox  Full                                                        +---------+---------------+---------+-----------+----------+--------------+ FV Mid   Full                                                        +---------+---------------+---------+-----------+----------+--------------+ FV DistalFull                                                        +---------+---------------+---------+-----------+----------+--------------+ PFV      Full                                                        +---------+---------------+---------+-----------+----------+--------------+ POP      Full                                         pulsatile      +---------+---------------+---------+-----------+----------+--------------+  PTV      Full                                                        +---------+---------------+---------+-----------+----------+--------------+ PERO     Full                                                        +---------+---------------+---------+-----------+----------+--------------+     Summary: RIGHT: - There is no evidence of deep vein thrombosis in the lower extremity.  - Ultrasound characteristics of enlarged lymph nodes are noted in the groin. Pulsatile waveforms consistent with fluid overload. Interstitial fluid noted throughout  LEFT: - There is  no evidence of deep vein thrombosis in the lower extremity.  Pulsatile waveforms consistent with fluid overload. Interstitial fluid noted throughout.  *See table(s) above for measurements and observations. Electronically signed by Servando Snare MD on 09/22/2019 at 12:51:20 AM.    Final      Lyndee Hensen, DO 09/22/2019, 3:06 PM PGY-1, [place] Intern pager: [phone], text pages welcome

## 2019-09-22 NOTE — Progress Notes (Addendum)
Occupational Therapy Evaluation Patient Details Name: Morgan Beasley MRN: [id number] DOB: 08/21/1960 Today's Date: 09/22/2019    History of Present Illness Morgan Beasley is a 59 y.o. female who presented with acute SOB x 2 days and bilateral lower extremity edema and was admitted for likely CHF exacerbation. Pmhx is s/f IDDM w/ peripheral neuropathy, COPD, ER + invasive ductal carcinoma s/p right mastectomy, HTN, chronic calcific pancreatitis   Clinical Impression   Patient lives alone in an apartment and has daughter assist with ADLs 3-4 times a day.  Patient transfers self independently with assistive equipment at prior level and typically uses a rollator.  Today patient complaining of significant pain in LE, RN aware.  She required increased motivation to participate.  She was able to complete bed mobility with supervision and transfers/ambulate for ADLs with min guard and RW.  LB ADLs with min assist and UB with min guard while standing.  Patient would benefit from AD for LB dressing to increase independence, as well as work on Copy.  Patient also anxious during session and would benefit from anxiety releif techniques.  Will continue to follow with OT acutely to address the deficits listed below.      Follow Up Recommendations  Home health OT;Supervision - Intermittent    Equipment Recommendations  None recommended by OT    Recommendations for Other Services       Precautions / Restrictions Precautions Precautions: Fall Restrictions Weight Bearing Restrictions: No      Mobility Bed Mobility Overal bed mobility: Needs Assistance Bed Mobility: Sit to Supine;Supine to Sit     Supine to sit: Supervision Sit to supine: Min assist      Transfers Overall transfer level: Needs assistance Equipment used: Rolling walker (2 wheeled) Transfers: Sit to/from Marketing executive Transfers Sit to Stand: Min guard Stand pivot  transfers: Min guard Squat pivot transfers: Min guard          Balance Overall balance assessment: Needs assistance Sitting-balance support: Bilateral upper extremity supported;Feet supported Sitting balance-Leahy Scale: Fair     Standing balance support: Bilateral upper extremity supported;No upper extremity supported Standing balance-Leahy Scale: Poor Standing balance comment: With standing mobility patient needs ex support.                            ADL either performed or assessed with clinical judgement   ADL Overall ADL's : Needs assistance/impaired Eating/Feeding: Set up;Sitting   Grooming: Min guard;Standing   Upper Body Bathing: Set up;Sitting   Lower Body Bathing: Minimal assistance;Sit to/from stand   Upper Body Dressing : Set up;Sitting   Lower Body Dressing: Minimal assistance;Sit to/from stand   Toilet Transfer: Min guard;RW;Comfort height toilet   Toileting- Water quality scientist and Hygiene: Supervision/safety;Sitting/lateral lean       Functional mobility during ADLs: Min guard;Rolling walker General ADL Comments: Pain is biggest limitation     Museum/gallery curator      Pertinent Vitals/Pain Pain Assessment: 0-10 Pain Score: 8  Pain Location: B LE Pain Descriptors / Indicators: Burning;Aching;Constant;Throbbing Pain Intervention(s): Limited activity within patient's tolerance;Monitored during session;Patient requesting pain meds-RN notified     Hand Dominance Right   Extremity/Trunk Assessment Upper Extremity Assessment Upper Extremity Assessment: Generalized weakness           Communication Communication Communication: No difficulties   Cognition Arousal/Alertness: Awake/alert Behavior During  Therapy: Anxious;WFL for tasks assessed/performed Overall Cognitive Status: No family/caregiver present to determine baseline cognitive functioning Area of Impairment: Following commands                        Following Commands: Follows multi-step commands with increased time       General Comments: Patient anxious and very focused on pain during session.     General Comments  SpO2 99-100    Exercises     Shoulder Instructions      Home Living Family/patient expects to be discharged to:: Private residence Living Arrangements: Alone;Other (Comment)(Daughter comes and checks on her 3-4 times a day) Available Help at Discharge: Family Type of Home: Apartment Home Access: Stairs to enter [employer] of Steps: 1 Entrance Stairs-Rails: Can reach both(no rail but can use porch to help) Home Layout: One level     Bathroom Shower/Tub: Teacher, early years/pre: Standard Bathroom Accessibility: No   Home Equipment: Environmental consultant - 4 wheels;Bedside commode          Prior Functioning/Environment Level of Independence: Needs assistance  Gait / Transfers Assistance Needed: Uses rollator for short distances ADL's / Homemaking Assistance Needed: Daughter assists with ADLs like showering. Patient states she can toilet transfer on own            OT Problem List: Decreased strength;Decreased activity tolerance;Impaired balance (sitting and/or standing);Pain;Increased edema      OT Treatment/Interventions: Self-care/ADL training;Therapeutic exercise;Energy conservation;Therapeutic activities;Patient/family education;Balance training    OT Goals(Current goals can be found in the care plan section) Acute Rehab OT Goals Patient Stated Goal: to reduce pain OT Goal Formulation: With patient Time For Goal Achievement: 10/06/19 Potential to Achieve Goals: Good  OT Frequency: Min 2X/week   Barriers to D/C:            Co-evaluation   PT/OT/SLP Co-Evaluation/Treatment: Yes Reason for Co-Treatment: Patient/therapist safety for transfers OT goals addressed during session: ADL's and self-care           AM-PAC OT "6 Clicks" Daily Activity     Outcome Measure  Help from another person eating meals?: A Little Help from another person taking care of personal grooming?: A Little Help from another person toileting, which includes using toliet, bedpan, or urinal?: A Little Help from another person bathing (including washing, rinsing, drying)?: A Little Help from another person to put on and taking off regular upper body clothing?: A Little Help from another person to put on and taking off regular lower body clothing?: A Little 6 Click Score: 18   End of Session Equipment Utilized During Treatment: Gait belt;Rolling walker Nurse Communication: Mobility status;Other (comment)(request for pain meds)  Activity Tolerance: Patient limited by pain Patient left: in bed;with call bell/phone within reach;with bed alarm set  OT Visit Diagnosis: Unsteadiness on feet (R26.81);Muscle weakness (generalized) (M62.81);Pain Pain - Right/Left: (bilat) Pain - part of body: Knee;Leg;Ankle and joints of foot                Time: 1020-1105 OT Time Calculation (min): 45 min Charges:  OT General Charges $OT Visit: 1 Visit OT Evaluation $OT Eval Moderate Complexity: 1 Mod OT Treatments $Self Care/Home Management : 8-22 mins  August Luz, OTR/L   Phylliss Bob 09/22/2019, 1:21 PM

## 2019-09-23 LAB — BASIC METABOLIC PANEL
Anion gap: 11 (ref 5–15)
BUN: 11 mg/dL (ref 6–20)
CO2: 30 mmol/L (ref 22–32)
Calcium: 8.4 mg/dL — ABNORMAL LOW (ref 8.9–10.3)
Chloride: 100 mmol/L (ref 98–111)
Creatinine, Ser: 1.15 mg/dL — ABNORMAL HIGH (ref 0.44–1.00)
GFR calc Af Amer: >60 mL/min (ref >60)
GFR calc non Af Amer: 52 mL/min — ABNORMAL LOW (ref >60)
Glucose, Bld: 180 mg/dL — ABNORMAL HIGH (ref 70–99)
Potassium: 3.4 mmol/L — ABNORMAL LOW (ref 3.5–5.1)
Sodium: 141 mmol/L (ref 135–145)

## 2019-09-23 LAB — CBC
HCT: 39.9 % (ref 36.0–46.0)
Hemoglobin: 12.5 g/dL (ref 12.0–15.0)
MCH: 27.5 pg (ref 26.0–34.0)
MCHC: 31.3 g/dL (ref 30.0–36.0)
MCV: 87.7 fL (ref 80.0–100.0)
Platelets: 364 10*3/uL (ref 150–400)
RBC: 4.55 MIL/uL (ref 3.87–5.11)
RDW: 17 % — ABNORMAL HIGH (ref 11.5–15.5)
WBC: 6.4 10*3/uL (ref 4.0–10.5)
nRBC: 0 % (ref 0.0–0.2)

## 2019-09-23 LAB — C-REACTIVE PROTEIN: CRP: 4.5 mg/dL — ABNORMAL HIGH (ref <1.0)

## 2019-09-23 LAB — GLUCOSE, CAPILLARY
Glucose-Capillary: 157 mg/dL — ABNORMAL HIGH (ref 70–99)
Glucose-Capillary: 171 mg/dL — ABNORMAL HIGH (ref 70–99)
Glucose-Capillary: 180 mg/dL — ABNORMAL HIGH (ref 70–99)
Glucose-Capillary: 168 mg/dL — ABNORMAL HIGH (ref 70–99)

## 2019-09-23 MED ORDER — HYDROCODONE-ACETAMINOPHEN 5-325 MG PO TABS
1.0000 | ORAL_TABLET | Freq: Four times a day (QID) | ORAL | Status: DC | PRN
  Administered 2019-09-23 – 2019-09-24 (×3): 1 via ORAL
  Filled 2019-09-23 (×3): qty 1

## 2019-09-23 MED ORDER — POLYETHYLENE GLYCOL 3350 17 G PO PACK
17.0000 g | PACK | Freq: Every day | ORAL | Status: DC
  Administered 2019-09-23 – 2019-09-25 (×3): 17 g via ORAL
  Filled 2019-09-23 (×3): qty 1

## 2019-09-23 NOTE — Plan of Care (Signed)
  RD consulted for nutrition education regarding diabetes.   Lab Results  Component Value Date   HGBA1C 11.7 (H) 09/22/2019    RD attempted to provide education to pt, but pt fell asleep during discussion and did not wake up when voice called by RD.   RD provided "Carbohydrate Counting for People with Diabetes" handout from the Academy of Nutrition and Dietetics. Handout discusses different food groups and their effects on blood sugar, emphasizing carbohydrate-containing foods and provides list of carbohydrates and recommended serving sizes of common foods. Handout also provides examples of ways to balance meals/snacks and encouraged intake of high-fiber, whole grain complex carbohydrates. Teach back method used.  Expect poor compliance.  Body mass index is 36.61 kg/m. Pt meets criteria for obesity based on current BMI.  Current diet order is Heart Healthy/Carb Modified, patient is consuming approximately 100% of meals at this time.   Labs: K+ 3.4 (L) CBGs 157-270 Medications reviewed and include: Lasix, Novolog, Lantus, Creon   No further nutrition interventions warranted at this time. RD contact information provided. If additional nutrition issues arise, please re-consult RD.   Larkin Ina, MS, RD, LDN RD pager number and weekend/on-call pager number located in [place].

## 2019-09-23 NOTE — Progress Notes (Signed)
Inpatient Diabetes Program Recommendations  AACE/ADA: New Consensus Statement on Inpatient Glycemic Control (2015)  Target Ranges:  Prepandial:   less than 140 mg/dL      Peak postprandial:   less than 180 mg/dL (1-2 hours)      Critically ill patients:  140 - 180 mg/dL   Lab Results  Component Value Date   GLUCAP 157 (H) 09/23/2019   HGBA1C 11.7 (H) 09/22/2019    Review of Glycemic Control Results for NNENNA, THACKSTON (MRN [id number]) as of 09/23/2019 10:33  Ref. Range 09/22/2019 11:30 09/22/2019 16:56 09/22/2019 21:49 09/23/2019 06:35  Glucose-Capillary Latest Ref Range: 70 - 99 mg/dL 172 (H) 217 (H) 270 (H) 157 (H)   Diabetes history: Type 2 DM Outpatient Diabetes medications: Amaryl 8 mg QAM, Humalog 14 units TID, Lantus 80 units QD Current orders for Inpatient glycemic control: Lantus 40 units QD, Novolog 0-9 units TID, Novolog 0-5 units QHS  Inpatient Diabetes Program Recommendations:    Consider adding Novolog 3 units TID (assuming patient is consuming >50% of meals).   Thanks, Bronson Curb, MSN, RNC-OB Diabetes Coordinator [phone] (8a-5p)

## 2019-09-23 NOTE — Consult Note (Signed)
CRP and ESR slightly elevated.  Arteriogram probably Thursday or Friday this week depending on CHF status.  Needs wound biopsy for diagnosis.  Again strange pattern for ulcers secondary to PAD and there is probably another underlying dianosis.  Will recheck pt status again in AM to determine day of angio  Morgan Hinds, MD [facility] Office: [phone]

## 2019-09-23 NOTE — Progress Notes (Signed)
tFamily Medicine Teaching Service Daily Progress Note Intern Pager: [phone]  Patient name: Morgan Beasley [facility] Medical record number: [id number] Date of birth: 08/05/1960 Age: 59 y.o. Gender: female  Primary Care Provider: Sonia Side., FNP Consultants: None Code Status: DNR  Pt Overview and Major Events to Date:  09/21/19: Admitted  Assessment and Plan: [name] is a 59 y.o. female who presented with acute SOB x 2 days and BLEE and was admitted for likely CHF exacerbation. .  Morgan Beasley's Pmhx is s/f IDDM w/ peripheral neuropathy, COPD, ER + invasive ductal carcinoma s/p right mastectomy, HTN, chronic calcific pancreatitis  Bilateral Lower extremity Edema  dyspnea  weight gain  Patient satting well on room air and lungs are clear. Patient diuresed 3.1L yesterday. Stable for possible arterial procedure.  Repeat CXR with mild cardiomegaly. Continues to have LE edema.  - Continuous cardiac monitoring  - monitor respiratory status  - Continue IV Lasix 40 mg BID  - Maintain O2 sats >88%.  - PT/OT: Home health recommended  - Daily weights   Foot Pain Left foot pain persists but improved. No temperature discrepancy present.  Continue Keflex for initial concern for cellulitis. CRP and ESR mildly elevated. ?ifnlammatory arthritis. Possibly pseudogout.  - continue to monitor daily  - Continue Keflex 500 mg QID x 5 days, 20 doses (5/23-5/28) -- Norco prn for pain   Rash vs ?ulcerative wounds Reports most recent wound was 3-4 days ago however on exam the area appears chronic. CRP 4.5and ESR 62 mildly elevated. ?Small vessel vasculitis though less likely.  Will defer punch biopsy until a fresh wound is available.  - Obtain PCP records    COPD  Stable. Home medications include. Symbicort 2 puffs twice daily, albuterol as needed.   Continue home medications. Morgan Beasley as hospital formulary -Continue home albuterol as needed  Insulin depended T2DM, uncontrolled  Patient's  diabetes regimen at home is Lantus 80 units, Humalog 14 units AC, glimepiride 8 mg daily with breakfast. Glucose on admission 338 with significant glucosuria >500. Fastuing glucose 171 this morning.  A1c 11.7 this admission.  - halve home lantus, 40 units in the morning  - sensitive sliding scale AC  - holding home glimiperide  - CBG QID, TID AC + QHS correction  --Consider adding SLGT-2 inhibitor (Jardiance, ect...)   Diabetic polyneuropathy No ulcers appreciated on foot exam.  Patient does have thickened heels but no obvious cracks. -Continue home gabapentin 800 mg 3 times daily  CKD II Patient's GFR consistently around or below 60.  On admission, creatinine is 1.16 which appears to be her baseline since about 2016.  Patient with >300 protein and > 500 glucose.  Rare bacteria on micro and patient denies dysuria.  Also moderate hemoglobin on dipstick.  Creatine 1.24 yesterday and 1.15 today.  - avoid nephrotoxic agents  - monitor Cr with daily BMP   HTN  HLD  BP 135/97. Cardiac medications at home include diltiazem XR 120 mg daily, losartan 50 mg daily, potassium as needed, Lasix 40 mg daily - continue home atorvastatin, losartan - holding home po lasix, diltiazem   PVD ABI's consistent with severe arterial disease. Vascular surgery consulted and recommends possible arteriogram/aortogram.  Patient prescribed Plavix. Patient states she has not taken Plavix since her colonoscopy. She did not restart the medication after the procedure as instructed. -  Vascular surgery consulted, appreciate recommendations    - consider restarting for primary v secondary prevention (follow up PCP notes)  - f/u vascular  studies   Overactive bladder Patient takes Myrbetriq's 50 mg daily - will hold in setting of diuresis  - Purewick okay at this time   Chronic calcific pancreatitis Follows with GI outpatient. - Continue Creon with meals and with snacks  OSA Patient uses CPAP at home  nightly -CPAP ordered  FEN/GI: Heart healthy carb modified diet, 1200 mL fluid restriction, replete electrolytes as needed PPx: Lovenox  Disposition: likely home   Subjective:  Patient reporting bilateral lower extremity pain.    Objective: Temp:  [98 F (36.7 C)-98.6 F (37 C)] 98.6 F (37 C) (05/25 0900) Pulse Rate:  [90-110] 94 (05/25 0900) Resp:  [16-18] 18 (05/25 0900) BP: (135-159)/(65-104) 146/83 (05/25 0900) SpO2:  [92 %-100 %] 95 % (05/25 0900) Weight:  [99.8 kg] 99.8 kg (05/24 2149)  Physical Exam: GEN: alert, female, appears older than stated age, watching TV in no acute distress  CV: regular rate and rhythm, distal DP pulses palpable  RESP: no increased work of breathing, clear to ascultation bilaterally with no crackles, wheezes, or rhonchi  ABD: soft, non-tender  MSK: bilateral pitting edema to the knee SKIN: warm, dry, circular ?ulcerative lesions, no purulent drainage    Laboratory: Recent Labs  Lab 09/21/19 1449 09/23/19 0603  WBC 8.8 6.4  HGB 12.7 12.5  HCT 41.1 39.9  PLT 370 364   Recent Labs  Lab 09/21/19 1449 09/22/19 0503 09/23/19 0603  NA 138 140 141  K 4.0 3.5 3.4*  CL 99 104 100  CO2 _0 BUN _1 CREATININE 1.16* 1.24* 1.15*  CALCIUM 8.6* 8.5* 8.4*  PROT 5.9*  --   --   BILITOT 0.7  --   --   ALKPHOS 122  --   --   ALT 20  --   --   AST 20  --   --   GLUCOSE 338* 170* 180*      Imaging/Diagnostic Tests: DG Chest 2 View  Result Date: 09/22/2019 CLINICAL DATA:  59 year old female with history of increasing edema for the past few days. EXAM: CHEST - 2 VIEW COMPARISON:  Chest x-ray 09/21/2019. FINDINGS: Areas of interstitial prominence and patchy ill-defined airspace opacities are noted in the left mid to lower lung, and to a lesser extent at the right lung base. Trace bilateral pleural effusions. Mild cephalization of the pulmonary vasculature. Heart size is mildly enlarged. Upper mediastinal contours are within normal  limits. IMPRESSION: 1. The appearance the chest suggests mild congestive heart failure, as above. The possibility of acute infection in the left lower lobe is difficult to exclude, but not strongly favored at this time. Electronically Signed   By: Vinnie Langton M.D.   On: 09/22/2019 18:18   VAS [place] ABI WITH/WO TBI  Result Date: 09/22/2019 LOWER EXTREMITY DOPPLER STUDY Indications: Rest pain, and ulceration. High Risk Factors: Diabetes.  Limitations: Today's exam was limited due to patient positioning, an open wound,              patient intolerant to cuff pressure and involuntary patient              movement. Comparison Study: No prior studies. Performing Technologist: Carlos Levering Rvt  Examination Guidelines: A complete evaluation includes at minimum, Doppler waveform signals and systolic blood pressure reading at the level of bilateral brachial, anterior tibial, and posterior tibial arteries, when vessel segments are accessible. Bilateral testing is considered an integral part of a complete examination. Photoelectric Plethysmograph (PPG) waveforms and  toe systolic pressure readings are included as required and additional duplex testing as needed. Limited examinations for reoccurring indications may be performed as noted.  ABI Findings: +---------+------------------+-----+----------+--------------+ Right    Rt Pressure (mmHg)IndexWaveform  Comment        +---------+------------------+-----+----------+--------------+ Brachial                                  Restricted arm +---------+------------------+-----+----------+--------------+ PTA      68                0.45 monophasic               +---------+------------------+-----+----------+--------------+ DP       67                0.44 monophasic               +---------+------------------+-----+----------+--------------+ Great Toe26                0.17                           +---------+------------------+-----+----------+--------------+ +--------+------------------+-----+----------+-------+ Left    Lt Pressure (mmHg)IndexWaveform  Comment +--------+------------------+-----+----------+-------+ Brachial151                    triphasic         +--------+------------------+-----+----------+-------+ PTA     74                0.49 monophasic        +--------+------------------+-----+----------+-------+ DP      61                0.40 monophasic        +--------+------------------+-----+----------+-------+ +-------+-----------+-----------+------------+------------+ ABI/TBIToday's ABIToday's TBIPrevious ABIPrevious TBI +-------+-----------+-----------+------------+------------+ Right  0.45       0.17                                +-------+-----------+-----------+------------+------------+ Left   0.49                                           +-------+-----------+-----------+------------+------------+  Summary: Right: Resting right ankle-brachial index indicates severe right lower extremity arterial disease. The right toe-brachial index is abnormal. Left: Resting left ankle-brachial index indicates severe left lower extremity arterial disease. Unable to obtain TBI due to low amplitude waveforms.  *See table(s) above for measurements and observations.  Electronically signed by Charles Fields MD on 09/22/2019 at 6:32:26 PM.    Final      , , DO 09/23/2019, 11:18 AM PGY-1, [facility] FPTS Intern pager: [phone], text pages welcome  

## 2019-09-23 NOTE — Hospital Course (Signed)
Biopsy tissue of lower extremities outpatient

## 2019-09-23 NOTE — Progress Notes (Signed)
Rt placed patient on CPAP. Patient tolerating CPAP well at this time. No respiratory distress noted. RT will monitor as needed.

## 2019-09-24 LAB — GLUCOSE, CAPILLARY
Glucose-Capillary: 70 mg/dL (ref 70–99)
Glucose-Capillary: 185 mg/dL — ABNORMAL HIGH (ref 70–99)
Glucose-Capillary: 175 mg/dL — ABNORMAL HIGH (ref 70–99)
Glucose-Capillary: 273 mg/dL — ABNORMAL HIGH (ref 70–99)

## 2019-09-24 LAB — BASIC METABOLIC PANEL
Anion gap: 13 (ref 5–15)
BUN: 9 mg/dL (ref 6–20)
CO2: 32 mmol/L (ref 22–32)
Calcium: 9.1 mg/dL (ref 8.9–10.3)
Chloride: 95 mmol/L — ABNORMAL LOW (ref 98–111)
Creatinine, Ser: 1.19 mg/dL — ABNORMAL HIGH (ref 0.44–1.00)
GFR calc Af Amer: 58 mL/min — ABNORMAL LOW (ref >60)
GFR calc non Af Amer: 50 mL/min — ABNORMAL LOW (ref >60)
Glucose, Bld: 96 mg/dL (ref 70–99)
Potassium: 3.6 mmol/L (ref 3.5–5.1)
Sodium: 140 mmol/L (ref 135–145)

## 2019-09-24 MED ORDER — OXYCODONE HCL 5 MG PO TABS
5.0000 mg | ORAL_TABLET | Freq: Four times a day (QID) | ORAL | Status: DC | PRN
  Administered 2019-09-24 – 2019-09-27 (×9): 10 mg via ORAL
  Filled 2019-09-24 (×9): qty 2

## 2019-09-24 MED ORDER — ACETAMINOPHEN 500 MG PO TABS
1000.0000 mg | ORAL_TABLET | Freq: Three times a day (TID) | ORAL | Status: DC
  Administered 2019-09-24 – 2019-09-27 (×8): 1000 mg via ORAL
  Filled 2019-09-24 (×8): qty 2

## 2019-09-24 MED ORDER — SENNA 8.6 MG PO TABS
1.0000 | ORAL_TABLET | Freq: Every day | ORAL | Status: DC
  Administered 2019-09-24 – 2019-09-25 (×2): 8.6 mg via ORAL
  Filled 2019-09-24 (×2): qty 1

## 2019-09-24 MED ORDER — FUROSEMIDE 10 MG/ML IJ SOLN
40.0000 mg | Freq: Every day | INTRAMUSCULAR | Status: DC
  Administered 2019-09-25 – 2019-09-27 (×3): 40 mg via INTRAVENOUS
  Filled 2019-09-24 (×3): qty 4

## 2019-09-24 NOTE — Progress Notes (Signed)
  Progress Note    09/24/2019 7:19 AM * No surgery found *  Subjective:  Continues with burning pain of both feet.  Can transfer from chair to bed independently. C/o some SOB due to "my COPD". Receiving Lasix   Vitals:   09/23/19 2148 09/24/19 0417  BP:  127/85  Pulse: 98 94  Resp: 18 16  Temp:  97.6 F (36.4 C)  SpO2: 95% 96%    Physical Exam: Cardiac:  RRR Lungs:  mildy dyspneic, no increased WOB Extremities:  Both feet warm with intact sensation. Mild edema  CBC    Component Value Date/Time   WBC 6.4 09/23/2019 0603   RBC 4.55 09/23/2019 0603   HGB 12.5 09/23/2019 0603   HCT 39.9 09/23/2019 0603   PLT 364 09/23/2019 0603   MCV 87.7 09/23/2019 0603   MCH 27.5 09/23/2019 0603   MCHC 31.3 09/23/2019 0603   RDW 17.0 (H) 09/23/2019 0603    BMET    Component Value Date/Time   NA 141 09/23/2019 0603   K 3.4 (L) 09/23/2019 0603   CL 100 09/23/2019 0603   CO2 30 09/23/2019 0603   GLUCOSE 180 (H) 09/23/2019 0603   BUN 11 09/23/2019 0603   CREATININE 1.15 (H) 09/23/2019 0603   CALCIUM 8.4 (L) 09/23/2019 0603   GFRNONAA 52 (L) 09/23/2019 0603   GFRAA >60 09/23/2019 0603     Intake/Output Summary (Last 24 hours) at 09/24/2019 0719 Last data filed at 09/24/2019 0553 Gross per 24 hour  Intake 720 ml  Output 3300 ml  Net -2580 ml    HOSPITAL MEDICATIONS Scheduled Meds: . amitriptyline  50 mg Oral QHS  . atorvastatin  40 mg Oral Daily  . cephALEXin  500 mg Oral Q6H  . enoxaparin (LOVENOX) injection  0.5 mg/kg Subcutaneous Q24H  . furosemide  40 mg Intravenous BID  . gabapentin  800 mg Oral TID  . insulin aspart  0-5 Units Subcutaneous QHS  . insulin aspart  0-9 Units Subcutaneous TID WC  . insulin glargine  40 Units Subcutaneous Daily  . lipase/protease/amylase  36,000 Units Oral TID WC  . losartan  50 mg Oral Daily  . mometasone-formoterol  2 puff Inhalation BID  . polyethylene glycol  17 g Oral Daily  . sodium chloride flush  3 mL Intravenous Q12H    Continuous Infusions: . sodium chloride     PRN Meds:.sodium chloride, acetaminophen, albuterol, HYDROcodone-acetaminophen, ondansetron (ZOFRAN) IV, sodium chloride flush  Assessment: Hx DM, neuropathy. LE pain with skin lesions. Creatinine 1.15 yesterday    Plan: -Ateriogram>>timing to be determined per Dr. Oneida Alar -DVT prophylaxis:  Lovenox   Risa Grill, PA-C [facility] [phone] 09/24/2019  7:19 AM

## 2019-09-24 NOTE — Progress Notes (Signed)
Physical Therapy Treatment Patient Details Name: Morgan Beasley MRN: [id number] DOB: February 03, 1961 Today's Date: 09/24/2019    History of Present Illness Morgan Beasley is a 59 y.o. female who presented with acute SOB x 2 days and bilateral lower extremity edema and was admitted for likely CHF exacerbation; course complicated by bil LE burning pain; plan for aortogram 5/27 or 28;  Pmhx is s/f IDDM w/ peripheral neuropathy, COPD, ER + invasive ductal carcinoma s/p right mastectomy, HTN, chronic calcific pancreatitis    PT Comments    Continuing work on functional mobility and activity tolerance;  Very sleepy this morning, but agreeable to ambulate with encouragement; Still with bil LE pain (LLE worse than RLE), but she did report slight improvement in pain level compared to last PT session; Used the RW well to unweigh LLE in stance to help with pain   Follow Up Recommendations  Home health PT     Equipment Recommendations  None recommended by PT(I believe she already has RW and 3in1)    Recommendations for Other Services       Precautions / Restrictions Precautions Precautions: Fall    Mobility  Bed Mobility Overal bed mobility: Needs Assistance Bed Mobility: Supine to Sit     Supine to sit: Supervision     General bed mobility comments: Slow moving, and requesting no help getting up to EOB  Transfers Overall transfer level: Needs assistance Equipment used: Rolling walker (2 wheeled) Transfers: Sit to/from Stand Sit to Stand: Min guard(without physical contact)         General transfer comment: Slow rise, dependent on UEs to push off bed and stabilize On RW  Ambulation/Gait Ambulation/Gait assistance: Min guard(with and without physical contact) Gait Distance (Feet): 20 Feet(to bathroom and back) Assistive device: Rolling walker (2 wheeled) Gait Pattern/deviations: Step-through pattern;Decreased step length - right;Decreased step length - left;Decreased  stride length     General Gait Details: Cues to self-monitor for activity tolerance; painful bil LEs; Able to use the RW to unweigh painful LEs in stance; L more painful than R   Stairs             Wheelchair Mobility    Modified Rankin (Stroke Patients Only)       Balance     Sitting balance-Leahy Scale: Fair       Standing balance-Leahy Scale: Poor Standing balance comment: With standing mobility patient needs ex support.                             Cognition Arousal/Alertness: Awake/alert Behavior During Therapy: Anxious;WFL for tasks assessed/performed Overall Cognitive Status: No family/caregiver present to determine baseline cognitive functioning                                 General Comments: Internally distracted by pain      Exercises      General Comments        Pertinent Vitals/Pain Pain Assessment: 0-10 Pain Score: 7  Pain Location: B LE; L LE more painful than R Pain Descriptors / Indicators: Burning;Aching;Constant;Throbbing Pain Intervention(s): Monitored during session;Other (comment)(cued to use RW to [facility] painful LE)    Home Living                      Prior Function            PT  Goals (current goals can now be found in the care plan section) Acute Rehab PT Goals Patient Stated Goal: to reduce pain PT Goal Formulation: With patient Time For Goal Achievement: 10/06/19 Potential to Achieve Goals: Good Progress towards PT goals: Progressing toward goals(slowly)    Frequency    Min 3X/week      PT Plan Current plan remains appropriate    Co-evaluation              AM-PAC PT "6 Clicks" Mobility   Outcome Measure  Help needed turning from your back to your side while in a flat bed without using bedrails?: None Help needed moving from lying on your back to sitting on the side of a flat bed without using bedrails?: None Help needed moving to and from a bed to a chair  (including a wheelchair)?: None Help needed standing up from a chair using your arms (e.g., wheelchair or bedside chair)?: A Little Help needed to walk in hospital room?: A Little Help needed climbing 3-5 steps with a railing? : A Lot 6 Click Score: 20    End of Session Equipment Utilized During Treatment: Gait belt Activity Tolerance: Patient limited by pain Patient left: in bed;with call bell/phone within reach(sitting EOB, Dr. Erin Hearing in to perform exam) Nurse Communication: Mobility status PT Visit Diagnosis: Other abnormalities of gait and mobility (R26.89);Pain Pain - Right/Left: Left(bilateral, L more painful than R) Pain - part of body: Leg     Time: [id number] PT Time Calculation (min) (ACUTE ONLY): 12 min  Charges:  $Gait Training: 8-22 mins                     Roney Marion, PT  Acute Rehabilitation Services Pager [phone] Office [place] 09/24/2019, 10:40 AM

## 2019-09-24 NOTE — Progress Notes (Addendum)
tFamily Medicine Teaching Service Daily Progress Note Intern Pager: [phone]  Patient name: Morgan Beasley [facility] Medical record number: [id number] Date of birth: 1961/04/14 Age: 59 y.o. Gender: female  Primary Care Provider: Sonia Side., FNP Consultants: None Code Status: DNR  Pt Overview and Major Events to Date:  09/21/19: Admitted 09/22/19: Vasc surgery consulted    Assessment and Plan: Morgan Beasley is a 59 y.o. female who presented with acute SOB x 2 days and BLEE and was admitted for likely CHF exacerbation. .  Rumi's Pmhx is s/f IDDM w/ peripheral neuropathy, COPD, ER + invasive ductal carcinoma s/p right mastectomy, HTN, chronic calcific pancreatitis  HFpEF Patient satting well on room air and lungs are clear.  Urinary output 3.3 L she is 6 L down from admission.  Patient states her lower extremity edema near baseline.  Her weights are downtrending as well.  Creatinine stable at 1.19 from 1.16 admission status post diuretics.  Echo this admission EF 50 to 55% with grade 2 diastolic dysfunction.  -Continue to monitor respiratory status -40 mg IV Lasix daily - Maintain O2 sats >88%.  - PT/OT: Home health recommended  - Daily weights   Foot Pain Foot pain persists and is likely multifactorial.  Continue Keflex as patient's pain is improving with this treatment.  Concern remains for peripheral artery disease decreased blood supply to her feet pain. - continue to monitor daily  - Continue Keflex 500 mg QID x 5 days, 20 doses (5/23-5/28) --Oxycodone prn for pain  -Tylenol scheduled  Rash vs ?ulcerative wounds Defer biopsy with a white fresh wound is available outpatient.  - Obtain PCP records    COPD  Stable. Home medications include. Symbicort 2 puffs twice daily, albuterol as needed.   Continue home medications. Ruthe Mannan as hospital formulary -Continue home albuterol as needed  Insulin depended T2DM, uncontrolled  Patient's diabetes regimen at home is  Lantus 80 units, Humalog 14 units AC, glimepiride 8 mg daily with breakfast. Glucose on admission 338 with significant glucosuria >500. Fastuing glucose 185 this morning.  A1c 11.7 this admission.  - halve home lantus, 40 units in the morning  - sensitive sliding scale AC  - holding home glimiperide  - CBG QID, TID AC + QHS correction  --Consider adding SLGT-2 inhibitor (Jardiance, ect...)   Diabetic polyneuropathy No ulcers appreciated on foot exam.  Patient does have thickened heels but no obvious cracks. -Continue home gabapentin 800 mg 3 times daily  CKD II Patient's GFR consistently around or below 60.  On admission, creatinine is 1.16 which appears to be her baseline since about 2016.  Patient with >300 protein and > 500 glucose.  Rare bacteria on micro and patient denies dysuria.  Also moderate hemoglobin on dipstick.  Creatine 1.15 yesterday and 1.19 today.  - avoid nephrotoxic agents  - monitor Cr with daily BMP   HTN  HLD  BP mildly hypertensive. Cardiac medications at home include diltiazem XR 120 mg daily, losartan 50 mg daily, potassium as needed, Lasix 40 mg daily - continue home atorvastatin, losartan - holding home po lasix,  -- Consider restarting diltiazem   PVD ABI's consistent with severe arterial disease. Vascular surgery consulted planning arteriogram/aortogram for Friday (5/28).  Patient prescribed Plavix. Patient states she has not taken Plavix since her colonoscopy. She did not restart the medication after the procedure as instructed. -  Vascular surgery consulted, appreciate recommendations    - consider restarting for primary v secondary prevention (follow up PCP notes)  -  f/u vascular studies   Overactive bladder Patient takes Myrbetriq's 50 mg daily - will hold in setting of diuresis  - Purewick okay at this time   Chronic calcific pancreatitis Follows with GI outpatient. - Continue Creon with meals and with snacks  OSA Patient uses CPAP at  home nightly -CPAP ordered  FEN/GI: Heart healthy carb modified diet, 1200 mL fluid restriction, replete electrolytes as needed, Miralax, Senna  PPx: Lovenox  Disposition: likely home   Subjective:  Reports lower extremity edema is close to her baseline.  Denies shortness of breath.  Continues to have foot pain.  Patient reports she put lotion on her legs.  Objective: Temp:  [97.5 F (36.4 C)-98.2 F (36.8 C)] 98.2 F (36.8 C) (05/26 0958) Pulse Rate:  [94-106] 105 (05/26 0958) Resp:  [16-18] 18 (05/26 0958) BP: (120-152)/(85-95) 120/93 (05/26 0958) SpO2:  [87 %-100 %] 95 % (05/26 0958) Weight:  [95.3 kg-96.1 kg] 96.1 kg (05/25 2109)  Physical Exam: GEN: Sitting on bedside commode, falls asleep however is easily arousable to voice CV: regular rate and rhythm, no murmurs appreciated RESP: no increased work of breathing, clear to ascultation bilaterally with no crackles, wheezes, or rhonchi  ABD: Soft, Nontender, Nondistended. MSK: 1+ pitting edema, normal range of motion SKIN: warm, dry, normal skin turgor    Laboratory: Recent Labs  Lab 09/21/19 1449 09/23/19 0603  WBC 8.8 6.4  HGB 12.7 12.5  HCT 41.1 39.9  PLT 370 364   Recent Labs  Lab 09/21/19 1449 09/21/19 1449 09/22/19 0503 09/23/19 0603 09/24/19 0659  NA 138   < > 140 141 140  K 4.0   < > 3.5 3.4* 3.6  CL 99   < > 104 100 95*  CO2 25   < > 28 30 32  BUN 8   < > 11 11 9   CREATININE 1.16*   < > 1.24* 1.15* 1.19*  CALCIUM 8.6*   < > 8.5* 8.4* 9.1  PROT 5.9*  --   --   --   --   BILITOT 0.7  --   --   --   --   ALKPHOS 122  --   --   --   --   ALT 20  --   --   --   --   AST 20  --   --   --   --   GLUCOSE 338*   < > 170* 180* 96   < > = values in this interval not displayed.      Imaging/Diagnostic Tests: No results found.   Lyndee Hensen, DO 09/24/2019, 10:01 AM PGY-1, [place] Intern pager: [phone], text pages welcome

## 2019-09-24 NOTE — Consult Note (Signed)
Patient seen and examined no real change in leg ulcers still complains of left foot pain.  Her ABIs are 0.5 bilaterally.  I'm not sure that the pain in her left foot represents rest pain.  Also, the ulcers on her calves are atypical of arterial occlusive disease.  However, she does have evidence of peripheral arterial disease.  I had a lengthy discussion with the patient today that we could consider an intervention for her left lower extremity to improve her overall arterial circulation but that this may not resolve all of the pain in her left foot.  I'm suspicious this may have another etiology.  Patient wishes to proceed with arteriogram.  I will schedule her for aortogram lower extremity runoff possible intervention by my partner Dr. Scot Dock for this Friday Sep 26, 2019.  Risk benefits possible complications and procedure details including but not limited to bleeding infection vessel injury were discussed with the patient today.  She understands agrees to proceed.  I would not consider a tibial intervention on this patient as this would put her in a high risk category for limited durability and potential limb loss situation and would only reserve this if she develops nonhealing wounds on her foot or ABIs that are considerably lower.  If we can do a straightforward percutaneous superficial femoral artery procedure we will plan this for Friday.  Ruta Hinds, MD [facility] Office: [phone]   CBC    Component Value Date/Time   WBC 6.4 09/23/2019 0603   RBC 4.55 09/23/2019 0603   HGB 12.5 09/23/2019 0603   HCT 39.9 09/23/2019 0603   PLT 364 09/23/2019 0603   MCV 87.7 09/23/2019 0603   MCH 27.5 09/23/2019 0603   MCHC 31.3 09/23/2019 0603   RDW 17.0 (H) 09/23/2019 0603    BMET    Component Value Date/Time   NA 141 09/23/2019 0603   K 3.4 (L) 09/23/2019 0603   CL 100 09/23/2019 0603   CO2 30 09/23/2019 0603   GLUCOSE 180 (H) 09/23/2019 0603   BUN  11 09/23/2019 0603   CREATININE 1.15 (H) 09/23/2019 0603   CALCIUM 8.4 (L) 09/23/2019 0603   GFRNONAA 52 (L) 09/23/2019 0603   GFRAA >60 09/23/2019 [id number]

## 2019-09-24 NOTE — Progress Notes (Signed)
Occupational Therapy Treatment Patient Details Name: Morgan Beasley MRN: [id number] DOB: 14-Feb-1961 Today's Date: 09/24/2019    History of present illness Morgan Beasley is a 59 y.o. female who presented with acute SOB x 2 days and bilateral lower extremity edema and was admitted for likely CHF exacerbation; course complicated by bil LE burning pain; plan for aortogram 5/27 or 28;  Pmhx is s/f IDDM w/ peripheral neuropathy, COPD, ER + invasive ductal carcinoma s/p right mastectomy, HTN, chronic calcific pancreatitis   OT comments  Pt limited by pain but willing to ambulate with S @ RW level. May benefit from use of AE to help with LB ADL. Continue to recommend Marion.  Follow Up Recommendations  Home health OT;Supervision - Intermittent    Equipment Recommendations  None recommended by OT    Recommendations for Other Services      Precautions / Restrictions Precautions Precautions: Fall       Mobility Bed Mobility Overal bed mobility: Needs Assistance Bed Mobility: Supine to Sit     Supine to sit: Supervision     General bed mobility comments: Slow moving, and requesting no help getting up to EOB  Transfers Overall transfer level: Needs assistance Equipment used: Rolling walker (2 wheeled) Transfers: Sit to/from Stand Sit to Stand: Supervision(without physical contact)              Balance     Sitting balance-Leahy Scale: Fair       Standing balance-Leahy Scale: Poor Standing balance comment: With standing mobility patient needs ex support.                            ADL either performed or assessed with clinical judgement   ADL Overall ADL's : Needs assistance/impaired     Grooming: Supervision/safety;Standing at sink               Lower Body Dressing: Minimal assistance;Sit to/from stand Lower Body Dressing Details (indicate cue type and reason): May benefit from use of AE             Functional mobility during ADLs:  Supervision/safety;Rolling walker       Vision       Perception     Praxis      Cognition Arousal/Alertness: Awake/alert Behavior During Therapy: Anxious;WFL for tasks assessed/performed Overall Cognitive Status: No family/caregiver present to determine baseline cognitive functioning                                 General Comments: most likely baseline        Exercises     Shoulder Instructions       General Comments Encouraged to keep B feet elevated and encouraged ankle pumps    Pertinent Vitals/ Pain       Pain Assessment: 0-10 Pain Score: 7  Pain Location: B LE; L LE more painful than R Pain Descriptors / Indicators: Burning;Aching;Constant;Throbbing Pain Intervention(s): Limited activity within patient's tolerance  Home Living                                          Prior Functioning/Environment              Frequency  Min 2X/week        Progress Toward Goals  OT Goals(current goals can now be found in the care plan section)  Progress towards OT goals: Progressing toward goals  Acute Rehab OT Goals Patient Stated Goal: to reduce pain OT Goal Formulation: With patient Time For Goal Achievement: 10/06/19 Potential to Achieve Goals: Good ADL Goals Pt Will Perform Grooming: with modified independence;standing Pt Will Perform Upper Body Dressing: with modified independence;sitting Pt Will Transfer to Toilet: with modified independence;ambulating;regular height toilet Pt Will Perform Tub/Shower Transfer: Tub transfer;with min assist;ambulating Additional ADL Goal #1: Patient will independently demonstrate 3 anxiety relief strategies.  Plan Discharge plan remains appropriate    Co-evaluation                 AM-PAC OT "6 Clicks" Daily Activity     Outcome Measure   Help from another person eating meals?: A Little Help from another person taking care of personal grooming?: A Little Help from another  person toileting, which includes using toliet, bedpan, or urinal?: A Little Help from another person bathing (including washing, rinsing, drying)?: A Little Help from another person to put on and taking off regular upper body clothing?: A Little Help from another person to put on and taking off regular lower body clothing?: A Little 6 Click Score: 18    End of Session Equipment Utilized During Treatment: Rolling walker  OT Visit Diagnosis: Unsteadiness on feet (R26.81);Muscle weakness (generalized) (M62.81);Pain Pain - part of body: (B feet)   Activity Tolerance Patient limited by pain   Patient Left in bed;with call bell/phone within reach;Other (comment)(declining to have bed alarm set)   Nurse Communication Mobility status        Time: [id number] OT Time Calculation (min): 17 min  Charges: OT General Charges $OT Visit: 1 Visit OT Treatments $Self Care/Home Management : 8-22 mins  Maurie Boettcher, OT/L   Acute OT Clinical Specialist [place] Pager [phone] Office [phone]    [facility] 09/24/2019, 2:42 PM

## 2019-09-25 DIAGNOSIS — I5033 Acute on chronic diastolic (congestive) heart failure: Secondary | ICD-10-CM

## 2019-09-25 DIAGNOSIS — E1142 Type 2 diabetes mellitus with diabetic polyneuropathy: Secondary | ICD-10-CM

## 2019-09-25 LAB — BASIC METABOLIC PANEL
Anion gap: 13 (ref 5–15)
BUN: 12 mg/dL (ref 6–20)
CO2: 28 mmol/L (ref 22–32)
Calcium: 8.4 mg/dL — ABNORMAL LOW (ref 8.9–10.3)
Chloride: 97 mmol/L — ABNORMAL LOW (ref 98–111)
Creatinine, Ser: 1.18 mg/dL — ABNORMAL HIGH (ref 0.44–1.00)
GFR calc Af Amer: 59 mL/min — ABNORMAL LOW (ref >60)
GFR calc non Af Amer: 51 mL/min — ABNORMAL LOW (ref >60)
Glucose, Bld: 219 mg/dL — ABNORMAL HIGH (ref 70–99)
Potassium: 4 mmol/L (ref 3.5–5.1)
Sodium: 138 mmol/L (ref 135–145)

## 2019-09-25 LAB — GLUCOSE, CAPILLARY
Glucose-Capillary: 163 mg/dL — ABNORMAL HIGH (ref 70–99)
Glucose-Capillary: 318 mg/dL — ABNORMAL HIGH (ref 70–99)
Glucose-Capillary: 227 mg/dL — ABNORMAL HIGH (ref 70–99)
Glucose-Capillary: 303 mg/dL — ABNORMAL HIGH (ref 70–99)

## 2019-09-25 MED ORDER — SENNA 8.6 MG PO TABS
1.0000 | ORAL_TABLET | Freq: Two times a day (BID) | ORAL | Status: DC
  Administered 2019-09-25: 8.6 mg via ORAL
  Filled 2019-09-25 (×2): qty 1

## 2019-09-25 MED ORDER — POLYETHYLENE GLYCOL 3350 17 G PO PACK
17.0000 g | PACK | Freq: Two times a day (BID) | ORAL | Status: DC
  Administered 2019-09-25: 17 g via ORAL
  Filled 2019-09-25 (×2): qty 1

## 2019-09-25 NOTE — H&P (View-Only) (Signed)
    Patient has been pre-op for aortogram lower extremity runoff possible intervention 09/26/19 by Dr. Deitra Mayo.  NPO past MN  [employer] PA-C

## 2019-09-25 NOTE — Plan of Care (Signed)
  Problem: Education: Goal: Knowledge of General Education information will improve Description: Including pain rating scale, medication(s)/side effects and non-pharmacologic comfort measures Outcome: Progressing   Problem: Activity: Goal: Risk for activity intolerance will decrease Outcome: Progressing   

## 2019-09-25 NOTE — TOC Progression Note (Addendum)
Transition of Care [facility]) - Progression Note    Patient Details  Name: Morgan Beasley MRN: [id number] Date of Birth: 04/14/1961  [facility] CM/SW Contact  Bartholomew Crews, RN Phone Number: [phone] 09/25/2019, 2:18 PM  Clinical Narrative:     Spoke with patient at the bedside. Patient agreeable to [facility] for RN, PT, OT - Referral accepted by [facility]. Patient will need [place] orders for RN, PT, OT with face to face at discharge.   Patient would benefit from increased PCS hours. Unable to reach [facility] prior to closing. PCS form ([place]) completed for change in condition and signed by MD. Virgel Gess to [employer] [phone]. If approved, Janeece Riggers will reach out to Medicaid who will reach out to [place].   TOC following for transition needs.   Expected Discharge Plan: [place] Barriers to Discharge: Continued Medical Work up  Expected Discharge Plan and Services Expected Discharge Plan: [place] In-house Referral: Clinical Social Work Discharge Planning Services: CM Consult Post Acute Care Choice: Durable Medical Equipment Living arrangements for the past 2 months: Apartment                                       Social Determinants of Health (SDOH) Interventions    Readmission Risk Interventions No flowsheet data found.

## 2019-09-25 NOTE — Progress Notes (Signed)
tFamily Medicine Teaching Service Daily Progress Note Intern Pager: [phone]  Patient name: Morgan Beasley [facility] Medical record number: [id number] Date of birth: 08-26-1960 Age: 59 y.o. Gender: female  Primary Care Provider: Sonia Side., FNP Consultants: None Code Status: DNR  Pt Overview and Major Events to Date:  09/21/19: Admitted 09/22/19: Vasc surgery consulted    Assessment and Plan: Morgan Beasley is a 59 y.o. female who presented with acute SOB x 2 days and BLEE and was admitted for likely CHF exacerbation. .  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  Urinary output 0.3 L she is 5 L down from admission.  Patient states her lower extremity edema at baseline.  Her weights are downtrending as well.  Creatinine stable at 1.19 from 1.16 admission status post diuretics.  Echo this admission EF 50 to 55% with grade 2 diastolic dysfunction.  -Continue to monitor respiratory status -40 mg IV Lasix daily - Maintain O2 sats >88%.  - PT/OT: Home health recommended  - Daily weights   Foot Pain Foot pain persists and is likely multifactorial.  Continue Keflex as patient's pain is improving with this treatment.  Concern remains for peripheral artery disease decreased blood supply to her feet pain. - continue to monitor daily  - Continue Keflex 500 mg QID x 5 days, 20 doses (5/23-5/28) --Oxycodone prn for pain  -Tylenol scheduled  Rash vs ?ulcerative wounds ? Behchet.  Will obtain additional autoimmune labs. Defer biopsy with a white fresh wound is available outpatient. No known history of insect bites.  Vascular reported wounds not consistent with known vascular disease.  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  - follow up Behchet labs   COPD  Stable. Home medications include. Symbicort 2 puffs twice daily, albuterol as  needed.   Continue home medications. Morgan Beasley as hospital formulary -Continue home albuterol as needed  Insulin depended T2DM, uncontrolled  Patient's diabetes regimen at home is Lantus 80 units, Humalog 14 units AC, glimepiride 8 mg daily with breakfast. Glucose on admission 338 with significant glucosuria >500. Fastuing glucose 163 this morning.   A1c 11.7 this admission.  - halve home lantus, 40 units in the morning  - sensitive sliding scale AC  - holding home glimiperide  - CBG QID, TID AC + QHS correction  --Consider adding SLGT-2 inhibitor (Jardiance, ect...)   Diabetic polyneuropathy No ulcers appreciated on foot exam.  Patient does have thickened heels but no obvious cracks. -Continue home gabapentin 800 mg 3 times daily  CKD II Patient's GFR consistently around or below 60.  On admission, creatinine is 1.16 and  1.18  today.  - avoid nephrotoxic agents  - monitor Cr with daily BMP   HTN  HLD  BP 137/94 today. Cardiac medications at home include diltiazem XR 120 mg daily, losartan 50 mg daily, potassium as needed, Lasix 40 mg daily - continue home atorvastatin, losartan - holding home po lasix,  -- Consider restarting diltiazem   PVD ABI's consistent with severe arterial disease. Vascular surgery planning arteriogram/aortogram for tomorrow.  Patient prescribed Plavix. Patient states she has not taken Plavix since her colonoscopy.  -  Vascular surgery consulted, appreciate recommendations    - Arteriogram tomorrow  - consider restarting for primary v secondary prevention (follow up PCP notes)    Overactive bladder Patient takes Myrbetriq's 50 mg daily - will hold  in setting of diuresis  - Purewick okay at this time   Chronic calcific pancreatitis Follows with GI outpatient. - Continue Creon with meals and with snacks  OSA Patient uses CPAP at home nightly -CPAP ordered  FEN/GI: Heart healthy carb modified diet, 1200 mL fluid restriction, replete  electrolytes as needed, Miralax, Senna  PPx: Lovenox  Disposition: likely home   Subjective:  Patient complains of constipation.   Objective: Temp:  [97.7 F (36.5 C)-98.3 F (36.8 C)] 97.8 F (36.6 C) (05/27 0947) Pulse Rate:  [94-105] 105 (05/27 0947) Resp:  [18-19] 18 (05/27 0947) BP: (111-137)/(78-94) 137/94 (05/27 0947) SpO2:  [92 %-99 %] 93 % (05/27 0947) Weight:  [96.5 kg] 96.5 kg (05/27 0619)  Physical Exam: GEN: alert, in no acute distress, non-ill appearing  CV: regular rate and rhythm, no murmurs appreciated RESP: no increased work of breathing, clear to ascultation bilaterally  ABD: Soft, Nontender, Nondistended. MSK: baseline edema, normal range of motion SKIN: warm, dry, circumferential lesions on bilateral LE, no drainage or discharge   Laboratory: Recent Labs  Lab 09/21/19 1449 09/23/19 0603  WBC 8.8 6.4  HGB 12.7 12.5  HCT 41.1 39.9  PLT 370 364   Recent Labs  Lab 09/21/19 1449 09/22/19 0503 09/23/19 0603 09/24/19 0659 09/25/19 0348  NA 138   < > 141 140 138  K 4.0   < > 3.4* 3.6 4.0  CL 99   < > 100 95* 97*  CO2 25   < > 30 32 28  BUN 8   < > 11 9 12   CREATININE 1.16*   < > 1.15* 1.19* 1.18*  CALCIUM 8.6*   < > 8.4* 9.1 8.4*  PROT 5.9*  --   --   --   --   BILITOT 0.7  --   --   --   --   ALKPHOS 122  --   --   --   --   ALT 20  --   --   --   --   AST 20  --   --   --   --   GLUCOSE 338*   < > 180* 96 219*   < > = values in this interval not displayed.      Imaging/Diagnostic Tests: No results found.   Lyndee Hensen, DO 09/25/2019, 10:26 AM PGY-1, [place] Intern pager: [phone], text pages welcome

## 2019-09-25 NOTE — Progress Notes (Addendum)
  No new concerns. B/L ankle pain persists with no change in severity.  Exam: Resp/Cards: Air entry equal and CTA B/L. S1 S2 normal, no murmur. RRR. Abd: Soft, NT/ND, BS+ and normal. Ext: Hyperpigmented and moderate feet selling. + B/L feet tenderness, worse on the left. Multiple round shallow ulcers at different healing stages on his LL and UL B/L.  A/P: HFpEF/Acute on chronic diastolic CHF. EF 50-55% with GIIDD. BNP of 700+ on admission. She seems to be at baseline. She is currently on Lasix 40 mg IV BID. Consider transitioning to oral once she completed her procedure and able to feed. Preferably Torsemide instead of oral Lasix. On Losartan for blood pressure. Currently not on beta blocker, likely due to hx of cocaine abuse. Monitor closely on her current regimen.  PVD: LL pain and swelling. ABI revied. Abdominal aortogram scheduled for 5/28. F/U post vascular eval.    Skin rash: R/O papular urticaria vs. Behchet disease. CRP elevated. Obtain RF, ANA, Anti CCP, and HLA B51. Consider skin biopsy as an outpatient.  DM2/CKD II; monitor closely on current regimen. Creatinine improved today. Avoid nephrotoxic agents.  COPD: Stable on current regimen.  For her other chronic problems, see the resident's note.

## 2019-09-25 NOTE — Plan of Care (Signed)
  Problem: Nutrition: Goal: Adequate nutrition will be maintained Outcome: Progressing   

## 2019-09-25 NOTE — Progress Notes (Signed)
    Patient has been pre-op for aortogram lower extremity runoff possible intervention 09/26/19 by Dr. Deitra Mayo.  NPO past MN  [employer] PA-C

## 2019-09-25 NOTE — Progress Notes (Signed)
Inpatient Diabetes Program Recommendations  AACE/ADA: New Consensus Statement on Inpatient Glycemic Control (2015)  Target Ranges:  Prepandial:   less than 140 mg/dL      Peak postprandial:   less than 180 mg/dL (1-2 hours)      Critically ill patients:  140 - 180 mg/dL   Lab Results  Component Value Date   GLUCAP 163 (H) 09/25/2019   HGBA1C 11.7 (H) 09/22/2019    Review of Glycemic Control  Results for DERRICA, ALIOTO (MRN [id number]) as of 09/25/2019 09:52  Ref. Range 09/24/2019 06:37 09/24/2019 11:15 09/24/2019 16:14 09/24/2019 21:07 09/25/2019 06:31  Glucose-Capillary Latest Ref Range: 70 - 99 mg/dL 70 185 (H) 175 (H) 273 (H) 163 (H)   Diabetes history: Type 2 DM Outpatient Diabetes medications: Amaryl 8 mg QAM, Humalog 14 units TID, Lantus 80 units QD Current orders for Inpatient glycemic control: Lantus 40 units QD, Novolog 0-9 units TID, Novolog 0-5 units QHS  Inpatient Diabetes Program Recommendations:    Consider adding Novolog 2-3 units TID (assuming patient is consuming >50% of meals).   Thanks, Tama Headings RN, MSN, BC-ADM Inpatient Diabetes Coordinator Team Pager [phone] (8a-5p)

## 2019-09-26 ENCOUNTER — Encounter (HOSPITAL_COMMUNITY)
Admission: EM | Disposition: A | Discharge status: Home Health | Payer: Self-pay | Source: Home / Self Care | Attending: Family Medicine

## 2019-09-26 DIAGNOSIS — I70202 Unspecified atherosclerosis of native arteries of extremities, left leg: Secondary | ICD-10-CM

## 2019-09-26 HISTORY — PX: PERIPHERAL VASCULAR INTERVENTION: CATH118257

## 2019-09-26 HISTORY — PX: ABDOMINAL AORTOGRAM W/LOWER EXTREMITY: CATH118223

## 2019-09-26 LAB — ENA+DNA/DS+ANTICH+CENTRO+JO...
Anti JO-1: <0.2 AI (ref 0.0–0.9)
Centromere Ab Screen: <0.2 AI (ref 0.0–0.9)
Chromatin Ab SerPl-aCnc: <0.2 AI (ref 0.0–0.9)
ENA SM Ab Ser-aCnc: <0.2 AI (ref 0.0–0.9)
Ribonucleic Protein: 1.3 AI — ABNORMAL HIGH (ref 0.0–0.9)
SSA (Ro) (ENA) Antibody, IgG: <0.2 AI (ref 0.0–0.9)
SSB (La) (ENA) Antibody, IgG: <0.2 AI (ref 0.0–0.9)
Scleroderma (Scl-70) (ENA) Antibody, IgG: <0.2 AI (ref 0.0–0.9)
ds DNA Ab: <1 IU/mL (ref 0–9)

## 2019-09-26 LAB — GLUCOSE, CAPILLARY
Glucose-Capillary: 144 mg/dL — ABNORMAL HIGH (ref 70–99)
Glucose-Capillary: 176 mg/dL — ABNORMAL HIGH (ref 70–99)
Glucose-Capillary: 197 mg/dL — ABNORMAL HIGH (ref 70–99)
Glucose-Capillary: 205 mg/dL — ABNORMAL HIGH (ref 70–99)
Glucose-Capillary: 278 mg/dL — ABNORMAL HIGH (ref 70–99)

## 2019-09-26 LAB — BASIC METABOLIC PANEL
Anion gap: 11 (ref 5–15)
BUN: 13 mg/dL (ref 6–20)
CO2: 30 mmol/L (ref 22–32)
Calcium: 8.6 mg/dL — ABNORMAL LOW (ref 8.9–10.3)
Chloride: 100 mmol/L (ref 98–111)
Creatinine, Ser: 1.15 mg/dL — ABNORMAL HIGH (ref 0.44–1.00)
GFR calc Af Amer: >60 mL/min (ref >60)
GFR calc non Af Amer: 52 mL/min — ABNORMAL LOW (ref >60)
Glucose, Bld: 179 mg/dL — ABNORMAL HIGH (ref 70–99)
Potassium: 3.5 mmol/L (ref 3.5–5.1)
Sodium: 141 mmol/L (ref 135–145)

## 2019-09-26 LAB — POCT ACTIVATED CLOTTING TIME: Activated Clotting Time: 257 seconds

## 2019-09-26 LAB — ANA W/REFLEX IF POSITIVE: Anti Nuclear Antibody (ANA): POSITIVE — AB

## 2019-09-26 SURGERY — ABDOMINAL AORTOGRAM W/LOWER EXTREMITY
Anesthesia: LOCAL

## 2019-09-26 MED ORDER — ACETAMINOPHEN 325 MG PO TABS: 650.0000 mg | ORAL_TABLET | ORAL | Status: DC | PRN

## 2019-09-26 MED ORDER — FUROSEMIDE 10 MG/ML IJ SOLN
INTRAMUSCULAR | Status: AC
  Filled 2019-09-26: qty 4

## 2019-09-26 MED ORDER — LIDOCAINE HCL (PF) 1 % IJ SOLN
INTRAMUSCULAR | Status: DC | PRN
  Administered 2019-09-26: 20 mL via INTRADERMAL

## 2019-09-26 MED ORDER — SODIUM CHLORIDE 0.9% FLUSH: 3.0000 mL | INTRAVENOUS | Status: DC | PRN

## 2019-09-26 MED ORDER — FENTANYL CITRATE (PF) 100 MCG/2ML IJ SOLN
INTRAMUSCULAR | Status: DC | PRN
  Administered 2019-09-26: 50 ug via INTRAVENOUS

## 2019-09-26 MED ORDER — ONDANSETRON HCL 4 MG/2ML IJ SOLN: 4.0000 mg | Freq: Four times a day (QID) | INTRAMUSCULAR | Status: DC | PRN

## 2019-09-26 MED ORDER — POLYETHYLENE GLYCOL 3350 17 G PO PACK
17.0000 g | PACK | Freq: Every day | ORAL | Status: DC
  Filled 2019-09-26: qty 1

## 2019-09-26 MED ORDER — MIDAZOLAM HCL 2 MG/2ML IJ SOLN
INTRAMUSCULAR | Status: DC | PRN
  Administered 2019-09-26: 1 mg via INTRAVENOUS

## 2019-09-26 MED ORDER — HEPARIN (PORCINE) IN NACL 1000-0.9 UT/500ML-% IV SOLN
INTRAVENOUS | Status: AC
  Filled 2019-09-26: qty 1000

## 2019-09-26 MED ORDER — SODIUM CHLORIDE 0.9% FLUSH
3.0000 mL | Freq: Two times a day (BID) | INTRAVENOUS | Status: DC
  Administered 2019-09-26 – 2019-09-27 (×2): 3 mL via INTRAVENOUS

## 2019-09-26 MED ORDER — HEPARIN SODIUM (PORCINE) 1000 UNIT/ML IJ SOLN
INTRAMUSCULAR | Status: AC
  Filled 2019-09-26: qty 1

## 2019-09-26 MED ORDER — LABETALOL HCL 5 MG/ML IV SOLN: 10.0000 mg | INTRAVENOUS | Status: DC | PRN

## 2019-09-26 MED ORDER — SENNA 8.6 MG PO TABS: 1.0000 | ORAL_TABLET | Freq: Every day | ORAL | Status: DC

## 2019-09-26 MED ORDER — SENNA 8.6 MG PO TABS
1.0000 | ORAL_TABLET | Freq: Every day | ORAL | Status: DC
  Filled 2019-09-26 (×2): qty 1

## 2019-09-26 MED ORDER — LIDOCAINE HCL (PF) 1 % IJ SOLN
INTRAMUSCULAR | Status: AC
  Filled 2019-09-26: qty 30

## 2019-09-26 MED ORDER — IODIXANOL 320 MG/ML IV SOLN
INTRAVENOUS | Status: DC | PRN
  Administered 2019-09-26: 180 mL via INTRA_ARTERIAL

## 2019-09-26 MED ORDER — DAPAGLIFLOZIN PROPANEDIOL 10 MG PO TABS
10.0000 mg | ORAL_TABLET | Freq: Every day | ORAL | 2 refills | Status: DC
Start: 2019-09-26 — End: 2020-04-28

## 2019-09-26 MED ORDER — HEPARIN (PORCINE) IN NACL 1000-0.9 UT/500ML-% IV SOLN
INTRAVENOUS | Status: DC | PRN
  Administered 2019-09-26 (×2): 500 mL

## 2019-09-26 MED ORDER — SODIUM CHLORIDE 0.9 % IV SOLN: INTRAVENOUS | Status: DC

## 2019-09-26 MED ORDER — MIDAZOLAM HCL 2 MG/2ML IJ SOLN
INTRAMUSCULAR | Status: AC
  Filled 2019-09-26: qty 2

## 2019-09-26 MED ORDER — SODIUM CHLORIDE 0.9 % IV SOLN: 250.0000 mL | INTRAVENOUS | Status: DC | PRN

## 2019-09-26 MED ORDER — DILTIAZEM HCL ER COATED BEADS 120 MG PO CP24
120.0000 mg | ORAL_CAPSULE | Freq: Every day | ORAL | Status: DC
  Administered 2019-09-26 – 2019-09-27 (×2): 120 mg via ORAL
  Filled 2019-09-26 (×2): qty 1

## 2019-09-26 MED ORDER — FENTANYL CITRATE (PF) 100 MCG/2ML IJ SOLN
INTRAMUSCULAR | Status: AC
  Filled 2019-09-26: qty 2

## 2019-09-26 MED ORDER — SODIUM CHLORIDE 0.9 % WEIGHT BASED INFUSION: 1.0000 mL/kg/h | INTRAVENOUS | Status: AC

## 2019-09-26 MED ORDER — HYDRALAZINE HCL 20 MG/ML IJ SOLN: 5.0000 mg | INTRAMUSCULAR | Status: DC | PRN

## 2019-09-26 MED ORDER — HEPARIN SODIUM (PORCINE) 1000 UNIT/ML IJ SOLN
INTRAMUSCULAR | Status: DC | PRN
  Administered 2019-09-26: 9000 [IU] via INTRAVENOUS

## 2019-09-26 MED FILL — FARXIGA 10 MG TABLET: 10 | 30 days supply | Qty: 30 | Fill #0

## 2019-09-26 SURGICAL SUPPLY — 27 items
BALLN STERLING OTW 3X150X150 (BALLOONS) ×3
BALLN STERLING SL 4X100X150 (BALLOONS) ×3
BALLOON STERLING OTW 3X150X150 (BALLOONS) IMPLANT
BALLOON STERLING SL 4X100X150 (BALLOONS) IMPLANT
CATH ANGIO 5F PIGTAIL 65CM (CATHETERS) ×1 IMPLANT
CATH CROSS OVER TEMPO 5F (CATHETERS) ×1 IMPLANT
CATH QUICKCROSS SUPP .035X90CM (MICROCATHETER) ×1 IMPLANT
CLOSURE MYNX CONTROL 6F/7F (Vascular Products) ×1 IMPLANT
DCB RANGER 4.0X200 150 (BALLOONS) IMPLANT
DEVICE TORQUE .025-.038 (MISCELLANEOUS) ×1 IMPLANT
GUIDEWIRE ANGLED .035X260CM (WIRE) ×1 IMPLANT
KIT ENCORE 26 ADVANTAGE (KITS) ×1 IMPLANT
KIT MICROPUNCTURE NIT STIFF (SHEATH) ×1 IMPLANT
KIT PV (KITS) ×3 IMPLANT
RANGER DCB 4.0X200 150 (BALLOONS) ×3
SHEATH FLEX ANSEL ST 6FR 45CM (SHEATH) ×1 IMPLANT
SHEATH PINNACLE 5F 10CM (SHEATH) ×1 IMPLANT
SHEATH PINNACLE 6F 10CM (SHEATH) ×1 IMPLANT
SHEATH PROBE COVER 6X72 (BAG) ×1 IMPLANT
STENT INNOVA 5X100X130 (Permanent Stent) ×1 IMPLANT
SYR MEDRAD MARK V 150ML (SYRINGE) ×1 IMPLANT
TAPE VIPERTRACK RADIOPAQ (MISCELLANEOUS) IMPLANT
TAPE VIPERTRACK RADIOPAQUE (MISCELLANEOUS) ×3
TRANSDUCER W/STOPCOCK (MISCELLANEOUS) ×3 IMPLANT
TRAY PV CATH (CUSTOM PROCEDURE TRAY) ×3 IMPLANT
WIRE BENTSON .035X145CM (WIRE) ×1 IMPLANT
WIRE SPARTACORE .014X300CM (WIRE) ×1 IMPLANT

## 2019-09-26 NOTE — Progress Notes (Signed)
PT Cancellation Note  Patient Details Name: Morgan Beasley MRN: [id number] DOB: February 07, 1961   Cancelled Treatment:    Reason Eval/Treat Not Completed: Patient at procedure or test/unavailable (cath lab). PT will continue to f/u with pt acutely as available and appropriate.    [place] 09/26/2019, 11:59 AM

## 2019-09-26 NOTE — Progress Notes (Signed)
Pt admitted today from Cath Lab. Vitals taken and all within normal range, with the exception of mild tachycardia at 101 BPM.  Pt placed on telemetry and CCMD notified.  CHG bath completed.  Pt neuro intact and A&O X 4.  Pt currently comfortable and will continue to be monitored.

## 2019-09-26 NOTE — Interval H&P Note (Signed)
History and Physical Interval Note:  09/26/2019 8:50 AM  Jenean Lindau  has presented today for surgery, with the diagnosis of pad.  The various methods of treatment have been discussed with the patient and family. After consideration of risks, benefits and other options for treatment, the patient has consented to  Procedure(s): ABDOMINAL AORTOGRAM W/LOWER EXTREMITY (N/A) as a surgical intervention.  The patient's history has been reviewed, patient examined, no change in status, stable for surgery.  I have reviewed the patient's chart and labs.  Questions were answered to the patient's satisfaction.     Deitra Mayo

## 2019-09-26 NOTE — Progress Notes (Signed)
Inpatient Diabetes Program Recommendations  AACE/ADA: New Consensus Statement on Inpatient Glycemic Control (2015)  Target Ranges:  Prepandial:   less than 140 mg/dL      Peak postprandial:   less than 180 mg/dL (1-2 hours)      Critically ill patients:  140 - 180 mg/dL   Lab Results  Component Value Date   GLUCAP 144 (H) 09/26/2019   HGBA1C 11.7 (H) 09/22/2019    Review of Glycemic Control  Results for Morgan Beasley, Morgan Beasley (MRN [id number]) as of 09/25/2019 09:52  Ref. Range 09/24/2019 06:37 09/24/2019 11:15 09/24/2019 16:14 09/24/2019 21:07 09/25/2019 06:31  Glucose-Capillary Latest Ref Range: 70 - 99 mg/dL 70 185 (H) 175 (H) 273 (H) 163 (H)  Results for YAIRA, BLANDA (MRN [id number]) as of 09/26/2019 09:36  Ref. Range 09/25/2019 06:31 09/25/2019 11:20 09/25/2019 16:36 09/25/2019 21:46 09/26/2019 04:59  Glucose-Capillary Latest Ref Range: 70 - 99 mg/dL 163 (H) 318 (H) 227 (H) 303 (H) 144 (H)   Diabetes history: Type 2 DM Outpatient Diabetes medications: Amaryl 8 mg QAM, Humalog 14 units TID, Lantus 80 units QD Current orders for Inpatient glycemic control: Lantus 40 units QD, Novolog 0-9 units TID, Novolog 0-5 units QHS  Inpatient Diabetes Program Recommendations:    Consider adding Novolog 3 units TID (assuming patient is consuming >50% of meals).   Thanks, Tama Headings RN, MSN, BC-ADM Inpatient Diabetes Coordinator Team Pager [phone] (8a-5p)

## 2019-09-26 NOTE — Progress Notes (Signed)
OT Cancellation Note  Patient Details Name: Morgan Beasley MRN: [id number] DOB: [birth year]   Cancelled Treatment:    Reason Eval/Treat Not Completed: Patient at procedure or test/ unavailable.  Will check back as able.    Sonia Baller, [place] [phone] 09/26/2019, 9:56 AM

## 2019-09-26 NOTE — Progress Notes (Signed)
tFamily Medicine Teaching Service Daily Progress Note Intern Pager: [phone]  Patient name: Morgan Beasley [facility] Medical record number: [id number] Date of birth: [birth year] Age: 59 y.o. Gender: female  Primary Care Provider: Sonia Side., FNP Consultants: None Code Status: DNR  Pt Overview and Major Events to Date:  09/21/19: Admitted 09/22/19: Vasc surgery consulted    Assessment and Plan: Morgan Beasley is a 59 y.o. female who presented with acute SOB x 2 days and BLEE and was admitted for likely CHF exacerbation. .  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  Urinary output 1.4 L + 4 unmeasured occurrences. she is 5.7 L down from admission.  Patient states her lower extremity edema at baseline.  Her weights are downtrending as well.  Creatinine stable at 1.14 from 1.16 admission status post diuretics.  Echo this admission EF 50 to 55% with grade 2 diastolic dysfunction.  -Continue to monitor respiratory status -40 mg IV Lasix daily - Maintain O2 sats >88%.  - PT/OT: Home health recommended  - Daily weights    Foot Pain Foot pain persists and is likely multifactorial.  Last day of Keflex today.  Patient to get arteriogram today for severe PAD.  - continue to monitor daily  - Continue Keflex 500 mg QID x 5 days, 20 doses (5/23-5/28) --Oxycodone prn for pain  -Tylenol scheduled  Rash vs ?ulcerative wounds ? Behchet.  ANA pending.   Defer biopsy with a white fresh wound is available outpatient. No known history of insect bites. ?Erythrema nodosum.   - follow up autoimmune labs  - ? HLA testing   COPD  Stable. Home medications include. Symbicort 2 puffs twice daily, albuterol as needed.   Continue home medications. Ruthe Mannan as hospital formulary -Continue home albuterol as needed  Insulin depended T2DM,  uncontrolled  Patient's diabetes regimen at home is Lantus 80 units, Humalog 14 units AC, glimepiride 8 mg daily with breakfast. Fastuing glucose 176  this morning.   A1c 11.7 this admission.  - halve home lantus, 40 units in the morning  - sensitive sliding scale AC  - holding home glimiperide  - CBG QID, TID AC + QHS correction  --Consider adding SLGT-2 inhibitor (Jardiance, ect...)   Diabetic polyneuropathy No ulcers appreciated on foot exam.  Patient does have thickened heels but no obvious cracks. -Continue home gabapentin 800 mg 3 times daily  CKD II Patient's GFR consistently around or below 60.  On admission, creatinine is 1.16 and  1.15  today.  - avoid nephrotoxic agents  - monitor Cr with daily BMP   HTN  HLD  BP132/91  today. Cardiac medications at home include diltiazem XR 120 mg daily, losartan 50 mg daily, potassium as needed, Lasix 40 mg daily - continue home medications  - holding home po lasix,    PVD ABI's consistent with severe arterial disease. Patient to have arteriorgram/arotogram today.  Will follow up with vadcular surgery for additional recommendations.   -  Vascular surgery consulted, appreciate recommendations    - consider restarting for primary v secondary prevention (follow up PCP notes)    Overactive bladder Patient takes Myrbetriq's 50 mg daily - will hold in setting of diuresis  - Purewick okay at this time   Chronic calcific pancreatitis Follows with GI outpatient. - Continue Creon with meals and with snacks  OSA  Patient uses CPAP at home nightly -CPAP ordered  FEN/GI: Heart healthy carb modified diet, 1200 mL fluid restriction, replete electrolytes as needed, Miralax, Senna  PPx: Lovenox  Disposition: likely home   Subjective:  Patient doing well this morning. States he had   Objective: Temp:  [97.4 F (36.3 C)-99.4 F (37.4 C)] 99.4 F (37.4 C) (05/28 0515) Pulse Rate:  [90-107] 102 (05/28 0515) Resp:  [16-20] 18  (05/28 0515) BP: (131-136)/(93-102) 131/102 (05/28 0515) SpO2:  [94 %-96 %] 96 % (05/28 0856) Weight:  [95.7 kg] 95.7 kg (05/28 0635)  Physical Exam: General: watching TV in no acute distress  HENT: no oropharyngeal lesions, edentulous  Cardiovascular: regular rate and rhythm, no murmurs appreciated  Respiratory: clear to ascultation bilaterally, no increased work of breathing on room air  Abdomen: soft, non-tender Extremities: ?ulcerative lesions on bilateral LE, tenderness LLE, minimal lower extremity edema at baseline  Laboratory: Recent Labs  Lab 09/21/19 1449 09/23/19 0603  WBC 8.8 6.4  HGB 12.7 12.5  HCT 41.1 39.9  PLT 370 364   Recent Labs  Lab 09/21/19 1449 09/22/19 0503 09/24/19 0659 09/25/19 0348 09/26/19 0414  NA 138   < > 140 138 141  K 4.0   < > 3.6 4.0 3.5  CL 99   < > 95* 97* 100  CO2 25   < > 32 28 30  BUN 8   < > 9 12 13  CREATININE 1.16*   < > 1.19* 1.18* 1.15*  CALCIUM 8.6*   < > 9.1 8.4* 8.6*  PROT 5.9*  --   --   --   --   BILITOT 0.7  --   --   --   --   ALKPHOS 122  --   --   --   --   ALT 20  --   --   --   --   AST 20  --   --   --   --   GLUCOSE 338*   < > 96 219* 179*   < > = values in this interval not displayed.      Imaging/Diagnostic Tests: No results found.   , , DO 09/26/2019, 9:59 AM PGY-1, [place] [facility] FPTS Intern pager: [phone], text pages welcome  

## 2019-09-26 NOTE — Op Note (Signed)
PATIENT: Morgan Beasley      MRN: [id number] DOB: [birth year]    DATE OF PROCEDURE: 09/26/2019  INDICATIONS:    Morgan Beasley is a 59 y.o. female who presents with left leg pain.  She was set up for arteriography and possible intervention  PROCEDURE:    1.  Conscious sedation 2.  Ultrasound-guided access to the right common femoral artery 3.  Aortogram with bilateral iliac arteriogram 4.  Selective catheterization of the left superficial femoral artery (third order catheterization) 5.  Angioplasty of left superficial femoral artery occlusion with drug-coated balloon 6.  Stenting of left superficial femoral artery with 5 mm x 10 cm self-expanding stent 7.  Mynx closure right common femoral artery  SURGEON: Judeth Cornfield. Scot Dock, MD, FACS  ANESTHESIA: Local with sedation  EBL: Minimal  TECHNIQUE: The patient was brought to the peripheral vascular lab and was sedated. The period of conscious sedation was 88 minutes.  During that time period, I was present face-to-face 100% of the time.  The patient was administered 1 mg of Versed and 50 mcg of fentanyl. The patient's heart rate, blood pressure, and oxygen saturation were monitored by the nurse continuously during the procedure.  Both groins were prepped and draped in the usual sterile fashion.  Under ultrasound guidance, after the skin was anesthetized, I cannulated the right common femoral artery with a micropuncture needle and a micropuncture sheath was introduced over a wire.  This was exchanged for a 5 [place] sheath over a Bentson wire.  By ultrasound the femoral artery was patent. A real-time image was obtained and sent to the server.  A pigtail catheter was positioned at the L1 vertebral body and flush aortogram obtained.  I then positioned the pigtail catheter above the aortic bifurcation and an oblique iliac projection was obtained.  Next bilateral lower extremity runoff films were obtained.  There was a 10 cm left  superficial femoral artery occlusion and elected to address this with angioplasty.  I exchanged the pigtail catheter for a crossover catheter which was positioned into the proximal left common iliac artery.  I then advanced the wire down into the iliac system on the left and exchanged the 5 [place] sheath for a long 6 [place] Ansell sheath.  This was positioned in the common femoral artery.  I then directed the wire into the superficial femoral artery and advanced the catheter into the superficial femoral artery.  Selective superficial femoral arteriogram was obtained.  I was then able to cross the 10 cm occlusion with an angled Glidewire.  I then advanced a quick cross catheter over the wire and injected contrast after the wire was removed to be sure that I was intraluminal.  The wire was then placed through the quick cross catheter.  I initially selected a 3 mm x 120 mm balloon which was positioned across the occlusion and inflated to 6 atm for 1 minute.  Completion film showed some dissection proximally.  I went back with a 4 mm x 150 mm drug-coated balloon.  This was inflated across the area of concern for 3 minutes.  There remains some residual dissection therefore I went back with a 5 mm x 100 mm self-expanding stent which was positioned across the marked area and deployed without difficulty postdilatation was done with a 4 mm x 100 cm balloon.  Completion film showed no residual stenosis.  Follow-up films downstream showed no evidence of embolic disease.  Next the long sheath was retracted back into the  right external iliac artery and exchanged for a short 6 French sheath over a wire the minx device was then placed through the sheath and deployed without difficulty there was good hemostasis.  No immediate complications were noted.  FINDINGS:   1.  Single renal arteries bilaterally with no significant renal artery stenosis identified.  The infrarenal aorta, bilateral common iliac arteries, bilateral  external iliac arteries and bilateral hypogastric arteries are patent. 2.  On the right side, the common femoral and deep femoral artery are patent.  There is a focal stenosis in the proximal superficial femoral artery.  There is an occlusion of the right superficial femoral artery in the thigh with reconstitution of the above-knee popliteal artery which has mild disease.  The below-knee popliteal artery is patent.  The dominant runoff is the anterior tibial artery.  The posterior tibial and peroneal arteries have moderate diffuse disease. 3.  On the left side, which is the side of concern, the common femoral and deep femoral artery are patent.  The proximal superficial femoral artery was patent.  The 10 cm occlusion of the superficial femoral artery was addressed with angioplasty and stenting as described above.  Below that the popliteal artery is patent and there is two-vessel runoff via the anterior tibial and posterior tibial arteries.  The peroneal artery has diffuse disease.  TASC Classification  Largest Sheath Size: 6 [place]  Target vessel: Left superficial femoral artery  % Stenosis: Pre 100%. Post 0%.  Lesion length: 10 cm  Calcification: Type C  Most impactful devices used (Up to 3): 3 mm x 120 mm balloon, 4 mm x 150 mm drug-coated balloon, 5 mm x 100 mm self-expanding stent  Outflow: Two-vessel runoff via the anterior tibial and posterior tibial arteries.   Morgan Mayo, MD, FACS [facility]  DATE OF DICTATION:   09/26/2019

## 2019-09-26 NOTE — Progress Notes (Signed)
Took CPAP to patient's room to put her on for the night and she was crying and stating that she did not want it on at this time, stated she was having a panic attack.  I informed RN and left CPAP at bedside for now.

## 2019-09-27 DIAGNOSIS — I739 Peripheral vascular disease, unspecified: Secondary | ICD-10-CM

## 2019-09-27 LAB — BASIC METABOLIC PANEL
Anion gap: 9 (ref 5–15)
BUN: 13 mg/dL (ref 6–20)
CO2: 29 mmol/L (ref 22–32)
Calcium: 8.3 mg/dL — ABNORMAL LOW (ref 8.9–10.3)
Chloride: 100 mmol/L (ref 98–111)
Creatinine, Ser: 1.19 mg/dL — ABNORMAL HIGH (ref 0.44–1.00)
GFR calc Af Amer: 58 mL/min — ABNORMAL LOW (ref >60)
GFR calc non Af Amer: 50 mL/min — ABNORMAL LOW (ref >60)
Glucose, Bld: 284 mg/dL — ABNORMAL HIGH (ref 70–99)
Potassium: 4.2 mmol/L (ref 3.5–5.1)
Sodium: 138 mmol/L (ref 135–145)

## 2019-09-27 LAB — CBC
HCT: 43.8 % (ref 36.0–46.0)
Hemoglobin: 13.4 g/dL (ref 12.0–15.0)
MCH: 27 pg (ref 26.0–34.0)
MCHC: 30.6 g/dL (ref 30.0–36.0)
MCV: 88.3 fL (ref 80.0–100.0)
Platelets: 375 10*3/uL (ref 150–400)
RBC: 4.96 MIL/uL (ref 3.87–5.11)
RDW: 16.9 % — ABNORMAL HIGH (ref 11.5–15.5)
WBC: 7.3 10*3/uL (ref 4.0–10.5)
nRBC: 0 % (ref 0.0–0.2)

## 2019-09-27 LAB — GLUCOSE, CAPILLARY
Glucose-Capillary: 246 mg/dL — ABNORMAL HIGH (ref 70–99)
Glucose-Capillary: 337 mg/dL — ABNORMAL HIGH (ref 70–99)

## 2019-09-27 LAB — RHEUMATOID FACTOR: Rheumatoid fact SerPl-aCnc: <10 IU/mL (ref 0.0–13.9)

## 2019-09-27 MED ORDER — ACETAMINOPHEN 325 MG PO TABS: 650.0000 mg | ORAL_TABLET | ORAL | 0 refills | Status: AC | PRN

## 2019-09-27 MED ORDER — TORSEMIDE 20 MG PO TABS: 40.0000 mg | ORAL_TABLET | Freq: Every day | ORAL | 0 refills | Status: DC

## 2019-09-27 MED ORDER — CLOPIDOGREL BISULFATE 75 MG PO TABS
75.0000 mg | ORAL_TABLET | Freq: Every day | ORAL | 11 refills | Status: DC
Start: 2019-09-27 — End: 2020-05-27

## 2019-09-27 MED ORDER — INSULIN GLARGINE 100 UNIT/ML ~~LOC~~ SOLN: 40.0000 [IU] | Freq: Every day | SUBCUTANEOUS | 11 refills | Status: DC

## 2019-09-27 MED ORDER — HYDROCODONE-ACETAMINOPHEN 7.5-325 MG PO TABS: 1.0000 | ORAL_TABLET | Freq: Four times a day (QID) | ORAL | 0 refills | Status: AC | PRN

## 2019-09-27 MED ORDER — TOREMIFENE CITRATE 60 MG PO TABS: 60.0000 mg | ORAL_TABLET | Freq: Every day | ORAL | 0 refills | Status: DC

## 2019-09-27 NOTE — Progress Notes (Signed)
Pt discharged today to home with family.  Pt taken off telemetry and CCMD notified.  Pt's IV removed.  Pt left with all of their personal belongings.  AVS documentation reviewed with Pt and all questions answered.   

## 2019-09-27 NOTE — Progress Notes (Addendum)
Pt ambulated and required no supplemental O2.  Pt stable at 94% while ambulating at RA and 96% RA while at rest.

## 2019-09-27 NOTE — Discharge Summary (Signed)
[facility] Discharge Summary  Patient name: Morgan Beas[facility] MEDICAL RECORD [id number] Date of birth: [birth year] Age: 59 y.o. Gender: female Date of Admission: 09/21/2019  Date of Discharge: 09/27/19  Admitting Physician: Wilber Oliphant, MD  Primary Care Provider: Sonia Side., FNP Consultants: Vascular surgery   Indication for Hospitalization: Acute respiratory failure   Discharge Diagnoses/Problem List:  Heart failure with preserved ejection fraction Severe peripheral arterial disease Lower extremity cellulitis Positive ANA Insulin dependent type 2 diabetes Diabetic polyneuropathy Hypertension Hyperlipidemia Overactive bladder COPD Chronic calcific pancreatitis Obstructive sleep apnea    Disposition: Home    Discharge Condition: Stable and improved  Discharge Exam:  GEN: alert, in no acute distress, non-ill appearing female watching TV in bed CV: regular rate and rhythm, no murmurs appreciated RESP: no increased work of breathing, clear to ascultation bilaterally, no rales, rhonchi or wheezing   ABD: Soft, Nontender, Nondistended. MSK: trace lower extremity edema, normal range of motion, tender to palpation bilaterally SKIN: warm, dry, well circumscribed lesions on bilateral LE, no drainage or discharge   Brief Hospital Course:  Morgan Beasley a 59 y.o.femalewho presented with acute SOB x 2 days and BLEEand was admitted for likely CHF exacerbation.. 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  Chest x-ray was consistent with volume overload.  BNP ~700.  Echo obtained was consistent for grade 2 diastolic dysfunction with an EF of 50 to 55%. She was diuresed with IV Lasix.  Weights down trended. Creatinine on admission was 1.16 and  on the day of discharge was 1.19.  Patient intermittently required 2 L nasal cannula.   On the day of discharge, she did not desat with ambulation physical therapy and was stable on room. She was discharged with 40 mg torsemide daily.    Peripheral arterial disease Due to patient's significant lower extremity pain.  There was concern for progression of her vascular disease. Vascular surgery was consulted after ABIs were consistent with severe arterial disease. Patient had arteriorgram/arotogram on 09/26/2019 that showed left superficial femoral artery occlusion that was stented.  Additionally, patient has a right superficial femoral artery occlusion.  On the day of discharge, physical therapy evaluated patient and recommended home health PT. She is to follow-up with Dr. Oneida Alar, vascular surgery, in 2 to 3 weeks to evaluate options for her right leg. Per Dr. Abbie Sons, vascular surgery, patient ok to restart Plavix at discharge.    Lower extremity cellulitis Patient complaining of bilateral foot and leg pain.  On admission, left extremity warmer than the right lower extremity.  There was concern for cellulitis.  Patient was treated with 5-day course of Keflex.  Due to lower extremity edema and pain, lower extremity Dopplers were obtained and were negative for DVT.  Pain was controlled with scheduled Tylenol and oxycodone as needed.   Lower extremity rash Patient reported lower extremity rash for the past 3 months. Well-circumscribed lesions present on bilateral lower extremities of varying ages.  Biopsies were deferred for the outpatient setting as there were no new lesions to biopsy.  Patient originally denied insect bites however stated on the day of discharge that she has (possibly brown recluse) spiders in her home.  It was thought that these lesions could be inflammatory in nature. ANA returned positive as well as positive for ribonucleic protein. Ribonucleic protein  is concerning for mixed connective  tissue disease. Patient should continue her rheumatologic work-up outpatient.   Insulin depended T2DM, uncontrolled  During admission, patient received 40 units Lantus daily (half of home dose) with sliding scale insulin.  CBG's were monitored throughout her admission.  A1c was 11.7, on admission. Wilder Glade was initiated.  Patient discharged with 40 units Lantus and was advised her PCP would titrate this medication as needed.     CKD II Patient's GFR consistently around or below 60.  This admission creatinine clearance was between 61 to 57 L/min. Creatinine on admission 1.16  on day of discharge was 1.19. Recommend continuing to monitor patient's renal function outpatient.    Home medications were continued for other chronic diseases which remained stable.    Issues for Follow Up:  1. Recommend obtaining BMP to assess creatine and electrolytes. Continue to monitor patient's renal function.  2. Patient discharged on 40 mg torsemide.  Continue to titrate this medication as needed. 3. Patient to follow-up with Dr. Oneida Alar, vascular surgery, in 3 to 4 weeks. 4. Recommend continuing rheumatologic work-up outpatient. 5. Plavix was restarted at discharge.  6. Patient discharged with 40 units Lantus.  Continue to titrate this as needed.Wilder Glade started for additional cardiovascular benefits.     Significant Procedures:  arteriogram / aortogram with right SFA stent  Significant Labs and Imaging:  Recent Labs  Lab 09/21/19 1449 09/23/19 0603 09/27/19 0433  WBC 8.8 6.4 7.3  HGB 12.7 12.5 13.4  HCT 41.1 39.9 43.8  PLT 370 364 375   Recent Labs  Lab 09/21/19 1449 09/22/19 0503 09/23/19 0603 09/23/19 0603 09/24/19 0659 09/24/19 0659 09/25/19 0348 09/25/19 0348 09/26/19 0414 09/27/19 0433  NA 138   < > 141  --  140  --  138  --  141 138  K 4.0   < > 3.4*   < > 3.6   < > 4.0   < > 3.5 4.2  CL 99   < > 100  --  95*  --  97*  --  100 100  CO2 25   < > 30  --  32  --  28  --  30 29   GLUCOSE 338*   < > 180*  --  96  --  219*  --  179* 284*  BUN 8   < > 11  --  9  --  12  --  13 13  CREATININE 1.16*   < > 1.15*  --  1.19*  --  1.18*  --  1.15* 1.19*  CALCIUM 8.6*   < > 8.4*  --  9.1  --  8.4*  --  8.6* 8.3*  ALKPHOS 122  --   --   --   --   --   --   --   --   --   AST 20  --   --   --   --   --   --   --   --   --   ALT 20  --   --   --   --   --   --   --   --   --   ALBUMIN 2.0*  --   --   --   --   --   --   --   --   --    < > = values in this interval not displayed.    PERIPHERAL VASCULAR CATHETERIZATION  Result Date: 09/26/2019 PATIENT: Dazja Houchin      MRN: [id number] DOB: 23-Dec-1960    DATE OF PROCEDURE: 09/26/2019 INDICATIONS:  Jackelynn Hosie is a 59 y.o. female who presents with left leg pain.  She was set up for arteriography and possible intervention PROCEDURE:  1.  Conscious sedation 2.  Ultrasound-guided access to the right common femoral artery 3.  Aortogram with bilateral iliac arteriogram 4.  Selective catheterization of the left superficial femoral artery (third order catheterization) 5.  Angioplasty of left superficial femoral artery occlusion with drug-coated balloon 6.  Stenting of left superficial femoral artery with 5 mm x 10 cm self-expanding stent 7.  Mynx closure right common femoral artery SURGEON: Judeth Cornfield. Scot Dock, MD, FACS ANESTHESIA: Local with sedation EBL: Minimal TECHNIQUE: The patient was brought to the peripheral vascular lab and was sedated. The period of conscious sedation was 88 minutes.  During that time period, I was present face-to-face 100% of the time.  The patient was administered 1 mg of Versed and 50 mcg of fentanyl. The patient's heart rate, blood pressure, and oxygen saturation were monitored by the nurse continuously during the procedure. Both groins were prepped and draped in the usual sterile fashion.  Under ultrasound guidance, after the skin was anesthetized, I cannulated the right common femoral artery with a  micropuncture needle and a micropuncture sheath was introduced over a wire.  This was exchanged for a 5 [place] sheath over a Bentson wire.  By ultrasound the femoral artery was patent. A real-time image was obtained and sent to the server. A pigtail catheter was positioned at the L1 vertebral body and flush aortogram obtained.  I then positioned the pigtail catheter above the aortic bifurcation and an oblique iliac projection was obtained.  Next bilateral lower extremity runoff films were obtained.  There was a 10 cm left superficial femoral artery occlusion and elected to address this with angioplasty. I exchanged the pigtail catheter for a crossover catheter which was positioned into the proximal left common iliac artery.  I then advanced the wire down into the iliac system on the left and exchanged the 5 [place] sheath for a long 6 [place] Ansell sheath.  This was positioned in the common femoral artery.  I then directed the wire into the superficial femoral artery and advanced the catheter into the superficial femoral artery.  Selective superficial femoral arteriogram was obtained.  I was then able to cross the 10 cm occlusion with an angled Glidewire.  I then advanced a quick cross catheter over the wire and injected contrast after the wire was removed to be sure that I was intraluminal.  The wire was then placed through the quick cross catheter.  I initially selected a 3 mm x 120 mm balloon which was positioned across the occlusion and inflated to 6 atm for 1 minute.  Completion film showed some dissection proximally.  I went back with a 4 mm x 150 mm drug-coated balloon.  This was inflated across the area of concern for 3 minutes.  There remains some residual dissection therefore I went back with a 5 mm x 100 mm self-expanding stent which was positioned across the marked area and deployed without difficulty postdilatation was done with a 4 mm x 100 cm balloon.  Completion film showed no residual stenosis.   Follow-up films downstream showed no evidence of embolic disease. Next the long sheath was retracted back into the right external iliac artery and exchanged for a short 6 [place]  sheath over a wire the minx device was then placed through the sheath and deployed without difficulty there was good hemostasis.  No immediate complications were noted. FINDINGS: 1.  Single renal arteries bilaterally with no significant renal artery stenosis identified.  The infrarenal aorta, bilateral common iliac arteries, bilateral external iliac arteries and bilateral hypogastric arteries are patent. 2.  On the right side, the common femoral and deep femoral artery are patent.  There is a focal stenosis in the proximal superficial femoral artery.  There is an occlusion of the right superficial femoral artery in the thigh with reconstitution of the above-knee popliteal artery which has mild disease.  The below-knee popliteal artery is patent.  The dominant runoff is the anterior tibial artery.  The posterior tibial and peroneal arteries have moderate diffuse disease. 3.  On the left side, which is the side of concern, the common femoral and deep femoral artery are patent.  The proximal superficial femoral artery was patent.  The 10 cm occlusion of the superficial femoral artery was addressed with angioplasty and stenting as described above.  Below that the popliteal artery is patent and there is two-vessel runoff via the anterior tibial and posterior tibial arteries.  The peroneal artery has diffuse disease. TASC Classification Largest Sheath Size: 6 [place] Target vessel: Left superficial femoral artery % Stenosis: Pre 100%. Post 0%. Lesion length: 10 cm Calcification: Type C Most impactful devices used (Up to 3): 3 mm x 120 mm balloon, 4 mm x 150 mm drug-coated balloon, 5 mm x 100 mm self-expanding stent Outflow: Two-vessel runoff via the anterior tibial and posterior tibial arteries. Deitra Mayo, MD, FACS [facility] DATE OF DICTATION:   09/26/2019    Results/Tests Pending at Time of Discharge:   Discharge Medications:  Allergies as of 09/27/2019      Reactions   Nsaids Other (See Comments)   Pancreatitis   Tolmetin Other (See Comments)   Pancreatitis   Aspirin Other (See Comments)   "Makes my pancreas act up"    Tramadol Swelling      Medication List    STOP taking these medications   doxycycline 100 MG capsule Commonly known as: VIBRAMYCIN   furosemide 40 MG tablet Commonly known as: LASIX   trimethoprim 100 MG tablet Commonly known as: TRIMPEX     TAKE these medications   acetaminophen 325 MG tablet Commonly known as: TYLENOL Take 2 tablets (650 mg total) by mouth every 4 (four) hours as needed for headache or mild pain.   amitriptyline 10 MG tablet Commonly known as: ELAVIL Take 5 tablets (50 mg total) by mouth at bedtime.   atorvastatin 40 MG tablet Commonly known as: LIPITOR Take 40 mg by mouth daily.   clopidogrel 75 MG tablet Commonly known as: Plavix Take 1 tablet (75 mg total) by mouth daily.   dapagliflozin propanediol 10 MG Tabs tablet Commonly known as: FARXIGA Take 1 tablet (10 mg total) by mouth daily before breakfast.   Dilt-XR 120 MG 24 hr capsule Generic drug: diltiazem Take 120 mg by mouth daily.   gabapentin 800 MG tablet Commonly known as: NEURONTIN Take 800 mg by mouth 3 (three) times daily.   glimepiride 4 MG tablet Commonly known as: AMARYL Take 8 mg by mouth daily with breakfast.   HumaLOG 100 UNIT/ML injection Generic drug: insulin lispro Inject 14 Units into the skin 3 (three) times daily before meals.   insulin glargine 100 UNIT/ML injection Commonly known as: Lantus  Inject 0.4 mLs (40 Units total) into the skin daily. What changed: how much to take   lipase/protease/amylase 36000 UNITS Cpep capsule Commonly known as: Creon Take 2 capsule by mouth with each meal and 1 capsule by mouth with each snack    losartan 50 MG tablet Commonly known as: COZAAR Take 50 mg by mouth daily.   Myrbetriq 50 MG Tb24 tablet Generic drug: mirabegron ER Take 50 mg by mouth daily.   potassium chloride 10 MEQ tablet Commonly known as: KLOR-CON Take 10 mEq by mouth daily as needed (For cramps).   ProAir HFA 108 (90 Base) MCG/ACT inhaler Generic drug: albuterol Inhale 2 puffs into the lungs every 6 (six) hours as needed for shortness of breath.   Symbicort 80-4.5 MCG/ACT inhaler Generic drug: budesonide-formoterol Inhale 2 puffs into the lungs 2 (two) times daily.   Toremifene Citrate 60 MG tablet Commonly known as: FARESTON Take 1 tablet (60 mg total) by mouth daily.   torsemide 20 MG tablet Commonly known as: DEMADEX Take 2 tablets (40 mg total) by mouth daily.       Discharge Instructions: Please refer to Patient Instructions section of EMR for full details.  Patient was counseled important signs and symptoms that should prompt return to medical care, changes in medications, dietary instructions, activity restrictions, and follow up appointments.   Follow-Up Appointments: Follow-up Information    [facility] Follow up.   Why: the office will call to schedule home health visits Contact information: [address] [place], [place] [address] [phone]       Elam Dutch, MD Follow up.   Specialties: Vascular Surgery, Cardiology Why: 3-4 weeks. Office will contact patient with appointment information Contact information: [place] [address] [place], [place]., FNP. Schedule an appointment as soon as possible for a visit on 09/30/2019.   Specialty: Family Medicine Why: Make appointment with PCP in the next 3 to 4 days.  Contact information: [place] [address] [place], [place], DO 09/27/2019, 3:38 PM PGY-1, [place]

## 2019-09-27 NOTE — Progress Notes (Signed)
SATURATION QUALIFICATIONS: (This note is used to comply with regulatory documentation for home oxygen)  Patient Saturations on Room Air at Rest = 96%  Patient Saturations on Room Air while Ambulating = 94%  Patient Saturations on 0 Liters of oxygen while Ambulating = 94%  Please briefly explain why patient needs home oxygen:

## 2019-09-27 NOTE — Progress Notes (Addendum)
  Progress Note    09/27/2019 8:10 AM 1 Day Post-Op  Subjective: no complaints, very sleepy this morning   Vitals:   09/26/19 2300 09/27/19 0424  BP: (!) 127/102 112/78  Pulse: 85 80  Resp: 20 20  Temp: 98.6 F (37 C) 97.7 F (36.5 C)  SpO2: 98% 99%   Physical Exam: Cardiac: regular Lungs: non labored Extremities: right common femoral access site clean, dry and intact without hematoma. Right AT doppler signal. Right foot warm with motor and sensory intact. Left doppler AT/ PT signals. Left foot warm with motor and sensory intact Abdomen:  Obese, soft and non tender Neurologic: alert and oriented  CBC    Component Value Date/Time   WBC 7.3 09/27/2019 0433   RBC 4.96 09/27/2019 0433   HGB 13.4 09/27/2019 0433   HCT 43.8 09/27/2019 0433   PLT 375 09/27/2019 0433   MCV 88.3 09/27/2019 0433   MCH 27.0 09/27/2019 0433   MCHC 30.6 09/27/2019 0433   RDW 16.9 (H) 09/27/2019 0433    BMET    Component Value Date/Time   NA 138 09/27/2019 0433   K 4.2 09/27/2019 0433   CL 100 09/27/2019 0433   CO2 29 09/27/2019 0433   GLUCOSE 284 (H) 09/27/2019 0433   BUN 13 09/27/2019 0433   CREATININE 1.19 (H) 09/27/2019 0433   CALCIUM 8.3 (L) 09/27/2019 0433   GFRNONAA 50 (L) 09/27/2019 0433   GFRAA 58 (L) 09/27/2019 0433    INR No results found for: INR   Intake/Output Summary (Last 24 hours) at 09/27/2019 0810 Last data filed at 09/27/2019 0400 Gross per 24 hour  Intake 360 ml  Output 2100 ml  Net -1740 ml     Assessment/Plan:  59 y.o. female is s/p  2. Ultrasound-guided access to the right common femoral artery 3.  Aortogram with bilateral iliac arteriogram 4.  Selective catheterization of the left superficial femoral artery (third order catheterization) 5.  Angioplasty of left superficial femoral artery occlusion with drug-coated balloon 6.  Stenting of left superficial femoral artery with 5 mm x 10 cm self-expanding stent 7.  Mynx closure right common femoral artery  1 Day Post-Op for left leg pain. Doing well post intervention. Bilateral lower extremities well perfused and warm. Right femoral access site clean, dry and intact without hematoma. Doppler signals right AT and left AT/ PT.  PT evaluation today. Increase mobilization as tolerated. Medical management per primary team. Okay for discharge from vascular standpoint. She will follow up with Dr. Oneida Alar in 2-3 weeks.  DVT prophylaxis: Lovenox   Karoline Caldwell, PA-C [facility] [phone] 09/27/2019 8:10 AM   I agree with the above.  I have seen and examined the patinet.  She had stenting of her left leg yesterday.  She has a right she has a right SFA occlusion.  She is stable for discharge.  She is to follow up with Dr. Oneida Alar in 2-3 weeks to evaluate options for the right leg.  Annamarie Major

## 2019-09-27 NOTE — Progress Notes (Addendum)
Physical Therapy Treatment Patient Details Name: Morgan Beasley MRN: [id number] DOB: 1960-05-16 Today's Date: 09/27/2019    History of Present Illness Morgan Beasley is a 59 y.o. 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 Pt was able to ambulate with min guard assist with RW with good safety. Pt feels much better today and feels more confident about going home with [place]. Pt has equipment at home.  Desaturation to 87% on RA with activity but was able to improve with cues for pursed lip breathing. O2 saturations remained >90% on RA with the rest of the walk. Saturations 100% on RA at rest. Pt currently with functional limitations due to balance and endurance deficits. Pt will benefit from skilled PT to increase their independence and safety with mobility to allow discharge to the venue listed below.    Follow Up Recommendations  Home health PT;Supervision - Intermittent     Equipment Recommendations  None recommended by PT    Recommendations for Other Services       Precautions / Restrictions Precautions Precautions: Fall Restrictions Weight Bearing Restrictions: No    Mobility  Bed Mobility Overal bed mobility: Needs Assistance Bed Mobility: Supine to Sit     Supine to sit: Supervision     General bed mobility comments: Slow moving but able to come to eOB on her own  Transfers Overall transfer level: Needs assistance Equipment used: Rolling walker (2 wheeled) Transfers: Sit to/from Stand Sit to Stand: Supervision(without physical contact)         General transfer comment: Slow rise, dependent on UEs to push off bed and  stabilize On RW  Ambulation/Gait Ambulation/Gait assistance: Min guard(with and without physical contact) Gait Distance (Feet): 150 Feet Assistive device: Rolling walker (2 wheeled) Gait Pattern/deviations: Step-through pattern;Decreased step length - right;Decreased step length - left;Decreased stride length;Trunk flexed   Gait velocity interpretation: <1.31 ft/sec, indicative of household ambulator General Gait Details: Cues to self-monitor for activity tolerance; painful bil LEs however pt able to progress ambulation wiht less c/o pain today; Able to use the RW to unweigh painful LEs in stance; L more painful than R.  Of note, pt did desat to 87% on Ra with activity however incr to 90% and above with pursed lip breathing.  O2 sats 100% at rest.     Stairs             Wheelchair Mobility    Modified Rankin (Stroke Patients Only)       Balance Overall balance assessment: Needs assistance Sitting-balance support: Bilateral upper extremity supported;Feet supported Sitting balance-Leahy Scale: Fair     Standing balance support: Bilateral upper extremity supported;No upper extremity supported Standing balance-Leahy Scale: Poor Standing balance comment: With standing mobility patient needs UE support on RW and not external support today.                             Cognition Arousal/Alertness: Awake/alert Behavior During Therapy: Anxious;WFL for tasks assessed/performed Overall Cognitive Status: No family/caregiver present to determine baseline cognitive functioning Area of Impairment: Following commands  Following Commands: Follows multi-step commands with increased time       General Comments: most likely baseline      Exercises General Exercises - Lower Extremity Ankle Circles/Pumps: AROM;Both;10 reps;Seated Long Arc Quad: AROM;Both;10 reps;Seated Hip Flexion/Marching: AROM;Both;10 reps;Seated    General Comments         Pertinent Vitals/Pain Pain Assessment: 0-10 Pain Score: 5  Pain Location: B LE; L LE more painful than R Pain Descriptors / Indicators: Burning;Aching;Constant;Throbbing Pain Intervention(s): Limited activity within patient's tolerance;Monitored during session;Repositioned    Home Living                      Prior Function            PT Goals (current goals can now be found in the care plan section) Acute Rehab PT Goals Patient Stated Goal: to reduce pain Progress towards PT goals: Progressing toward goals    Frequency    Min 3X/week      PT Plan Current plan remains appropriate    Co-evaluation              AM-PAC PT "6 Clicks" Mobility   Outcome Measure  Help needed turning from your back to your side while in a flat bed without using bedrails?: None Help needed moving from lying on your back to sitting on the side of a flat bed without using bedrails?: None Help needed moving to and from a bed to a chair (including a wheelchair)?: None Help needed standing up from a chair using your arms (e.g., wheelchair or bedside chair)?: A Little Help needed to walk in hospital room?: A Little Help needed climbing 3-5 steps with a railing? : A Lot 6 Click Score: 20    End of Session Equipment Utilized During Treatment: Gait belt Activity Tolerance: Patient limited by fatigue;Patient tolerated treatment well Patient left: in bed;with call bell/phone within reach;with bed alarm set(wanted to get back in bed as she said she didnt sleep well) Nurse Communication: Mobility status PT Visit Diagnosis: Other abnormalities of gait and mobility (R26.89);Pain Pain - Right/Left: Left(bilateral, L more painful than R) Pain - part of body: Leg     Time: [id number] PT Time Calculation (min) (ACUTE ONLY): 17 min  Charges:  $Gait Training: 8-22 mins                     Hieu Herms W,PT Acute Rehabilitation Services Pager:  [phone]  Office:  [place] 09/27/2019, 12:44 PM

## 2019-09-27 NOTE — Progress Notes (Signed)
Patient ask N.T. to d CBG it was 197.

## 2019-09-28 ENCOUNTER — Telehealth: Payer: Self-pay

## 2019-09-28 NOTE — Telephone Encounter (Signed)
Linda from [facility] states that Ms Dallie Dad was ordered home health services and they were called and accepted, but no  Orders in the system. There was no face to face or orders documented  In the chart or on discharge prior to leaving.  The patient will have to call their primary care to order services on Tuesday. Vaughan Basta will follow up with Havery Moros with [facility].

## 2019-09-29 LAB — CYCLIC CITRUL PEPTIDE ANTIBODY, IGG/IGA: CCP Antibodies IgG/IgA: 3 units (ref 0–19)

## 2019-10-01 ENCOUNTER — Encounter (HOSPITAL_COMMUNITY): Payer: Self-pay | Admitting: Family Medicine

## 2019-10-09 ENCOUNTER — Emergency Department (HOSPITAL_COMMUNITY): Payer: Medicare Other

## 2019-10-09 ENCOUNTER — Inpatient Hospital Stay (HOSPITAL_COMMUNITY)
Admission: AD | Admit: 2019-10-09 | Discharge: 2019-10-21 | DRG: 253 | Disposition: A | Discharge status: Home Health | Payer: Medicare Other | Source: Ambulatory Visit | Attending: Vascular Surgery | Admitting: Vascular Surgery

## 2019-10-09 DIAGNOSIS — E785 Hyperlipidemia, unspecified: Secondary | ICD-10-CM | POA: Diagnosis present

## 2019-10-09 DIAGNOSIS — I110 Hypertensive heart disease with heart failure: Secondary | ICD-10-CM | POA: Diagnosis present

## 2019-10-09 DIAGNOSIS — L03116 Cellulitis of left lower limb: Secondary | ICD-10-CM | POA: Diagnosis present

## 2019-10-09 DIAGNOSIS — F17210 Nicotine dependence, cigarettes, uncomplicated: Secondary | ICD-10-CM | POA: Diagnosis present

## 2019-10-09 DIAGNOSIS — I2510 Atherosclerotic heart disease of native coronary artery without angina pectoris: Secondary | ICD-10-CM | POA: Diagnosis present

## 2019-10-09 DIAGNOSIS — J449 Chronic obstructive pulmonary disease, unspecified: Secondary | ICD-10-CM | POA: Diagnosis present

## 2019-10-09 DIAGNOSIS — Z66 Do not resuscitate: Secondary | ICD-10-CM | POA: Diagnosis present

## 2019-10-09 DIAGNOSIS — I5032 Chronic diastolic (congestive) heart failure: Secondary | ICD-10-CM | POA: Diagnosis present

## 2019-10-09 DIAGNOSIS — R9439 Abnormal result of other cardiovascular function study: Secondary | ICD-10-CM | POA: Diagnosis not present

## 2019-10-09 DIAGNOSIS — Z7902 Long term (current) use of antithrombotics/antiplatelets: Secondary | ICD-10-CM

## 2019-10-09 DIAGNOSIS — L0390 Cellulitis, unspecified: Secondary | ICD-10-CM | POA: Diagnosis not present

## 2019-10-09 DIAGNOSIS — I429 Cardiomyopathy, unspecified: Secondary | ICD-10-CM | POA: Diagnosis present

## 2019-10-09 DIAGNOSIS — T82868A Thrombosis of vascular prosthetic devices, implants and grafts, initial encounter: Secondary | ICD-10-CM | POA: Diagnosis present

## 2019-10-09 DIAGNOSIS — Z853 Personal history of malignant neoplasm of breast: Secondary | ICD-10-CM | POA: Diagnosis not present

## 2019-10-09 DIAGNOSIS — I70222 Atherosclerosis of native arteries of extremities with rest pain, left leg: Secondary | ICD-10-CM | POA: Diagnosis present

## 2019-10-09 DIAGNOSIS — Z886 Allergy status to analgesic agent status: Secondary | ICD-10-CM | POA: Diagnosis not present

## 2019-10-09 DIAGNOSIS — Z794 Long term (current) use of insulin: Secondary | ICD-10-CM

## 2019-10-09 DIAGNOSIS — Y831 Surgical operation with implant of artificial internal device as the cause of abnormal reaction of the patient, or of later complication, without mention of misadventure at the time of the procedure: Secondary | ICD-10-CM | POA: Diagnosis present

## 2019-10-09 DIAGNOSIS — I509 Heart failure, unspecified: Secondary | ICD-10-CM

## 2019-10-09 DIAGNOSIS — I739 Peripheral vascular disease, unspecified: Secondary | ICD-10-CM

## 2019-10-09 DIAGNOSIS — I70291 Other atherosclerosis of native arteries of extremities, right leg: Secondary | ICD-10-CM | POA: Diagnosis present

## 2019-10-09 DIAGNOSIS — E1151 Type 2 diabetes mellitus with diabetic peripheral angiopathy without gangrene: Secondary | ICD-10-CM | POA: Diagnosis present

## 2019-10-09 DIAGNOSIS — Z6834 Body mass index (BMI) 34.0-34.9, adult: Secondary | ICD-10-CM | POA: Diagnosis not present

## 2019-10-09 DIAGNOSIS — Z9011 Acquired absence of right breast and nipple: Secondary | ICD-10-CM

## 2019-10-09 DIAGNOSIS — I998 Other disorder of circulatory system: Secondary | ICD-10-CM | POA: Diagnosis present

## 2019-10-09 DIAGNOSIS — M79672 Pain in left foot: Secondary | ICD-10-CM | POA: Diagnosis not present

## 2019-10-09 DIAGNOSIS — Z79899 Other long term (current) drug therapy: Secondary | ICD-10-CM

## 2019-10-09 DIAGNOSIS — G4733 Obstructive sleep apnea (adult) (pediatric): Secondary | ICD-10-CM | POA: Diagnosis present

## 2019-10-09 DIAGNOSIS — Z20822 Contact with and (suspected) exposure to covid-19: Secondary | ICD-10-CM | POA: Diagnosis present

## 2019-10-09 DIAGNOSIS — I10 Essential (primary) hypertension: Secondary | ICD-10-CM | POA: Diagnosis not present

## 2019-10-09 DIAGNOSIS — E1140 Type 2 diabetes mellitus with diabetic neuropathy, unspecified: Secondary | ICD-10-CM | POA: Diagnosis present

## 2019-10-09 DIAGNOSIS — Z01810 Encounter for preprocedural cardiovascular examination: Secondary | ICD-10-CM | POA: Diagnosis not present

## 2019-10-09 LAB — CBG MONITORING, ED
Glucose-Capillary: 54 mg/dL — ABNORMAL LOW (ref 70–99)
Glucose-Capillary: 56 mg/dL — ABNORMAL LOW (ref 70–99)
Glucose-Capillary: 79 mg/dL (ref 70–99)

## 2019-10-09 LAB — CBC WITH DIFFERENTIAL/PLATELET
Abs Immature Granulocytes: 0.03 10*3/uL (ref 0.00–0.07)
Basophils Absolute: 0 10*3/uL (ref 0.0–0.1)
Basophils Relative: 0 %
Eosinophils Absolute: 0.2 10*3/uL (ref 0.0–0.5)
Eosinophils Relative: 2 %
HCT: 41.9 % (ref 36.0–46.0)
Hemoglobin: 13.3 g/dL (ref 12.0–15.0)
Immature Granulocytes: 0 %
Lymphocytes Relative: 32 %
Lymphs Abs: 2.8 10*3/uL (ref 0.7–4.0)
MCH: 27.1 pg (ref 26.0–34.0)
MCHC: 31.7 g/dL (ref 30.0–36.0)
MCV: 85.5 fL (ref 80.0–100.0)
Monocytes Absolute: 0.8 10*3/uL (ref 0.1–1.0)
Monocytes Relative: 9 %
Neutro Abs: 5 10*3/uL (ref 1.7–7.7)
Neutrophils Relative %: 57 %
Platelets: 458 10*3/uL — ABNORMAL HIGH (ref 150–400)
RBC: 4.9 MIL/uL (ref 3.87–5.11)
RDW: 17.2 % — ABNORMAL HIGH (ref 11.5–15.5)
WBC: 8.8 10*3/uL (ref 4.0–10.5)
nRBC: 0.2 % (ref 0.0–0.2)

## 2019-10-09 LAB — BASIC METABOLIC PANEL
Anion gap: 10 (ref 5–15)
BUN: 10 mg/dL (ref 6–20)
CO2: 27 mmol/L (ref 22–32)
Calcium: 8.9 mg/dL (ref 8.9–10.3)
Chloride: 102 mmol/L (ref 98–111)
Creatinine, Ser: 1.27 mg/dL — ABNORMAL HIGH (ref 0.44–1.00)
GFR calc Af Amer: 54 mL/min — ABNORMAL LOW (ref >60)
GFR calc non Af Amer: 46 mL/min — ABNORMAL LOW (ref >60)
Glucose, Bld: 104 mg/dL — ABNORMAL HIGH (ref 70–99)
Potassium: 3.5 mmol/L (ref 3.5–5.1)
Sodium: 139 mmol/L (ref 135–145)

## 2019-10-09 LAB — BRAIN NATRIURETIC PEPTIDE: B Natriuretic Peptide: 488.5 pg/mL — ABNORMAL HIGH (ref 0.0–100.0)

## 2019-10-09 LAB — SARS CORONAVIRUS 2 BY RT PCR (HOSPITAL ORDER, PERFORMED IN ~~LOC~~ HOSPITAL LAB): SARS Coronavirus 2: NEGATIVE

## 2019-10-09 MED ORDER — PIPERACILLIN-TAZOBACTAM 3.375 G IVPB: 3.3750 g | Freq: Three times a day (TID) | INTRAVENOUS | Status: DC

## 2019-10-09 MED ORDER — HEPARIN BOLUS VIA INFUSION
4000.0000 [IU] | Freq: Once | INTRAVENOUS | Status: AC
  Administered 2019-10-09: 4000 [IU] via INTRAVENOUS
  Filled 2019-10-09: qty 4000

## 2019-10-09 MED ORDER — IOHEXOL 350 MG/ML SOLN
100.0000 mL | Freq: Once | INTRAVENOUS | Status: AC | PRN
  Administered 2019-10-09: 100 mL via INTRAVENOUS

## 2019-10-09 MED ORDER — HEPARIN (PORCINE) 25000 UT/250ML-% IV SOLN
12.0000 [IU]/kg/h | INTRAVENOUS | Status: DC
  Administered 2019-10-09: 12 [IU]/kg/h via INTRAVENOUS
  Filled 2019-10-09 (×2): qty 250

## 2019-10-09 MED ORDER — VANCOMYCIN HCL 1500 MG/300ML IV SOLN
1500.0000 mg | Freq: Once | INTRAVENOUS | Status: AC
  Administered 2019-10-09: 1500 mg via INTRAVENOUS
  Filled 2019-10-09: qty 300

## 2019-10-09 MED ORDER — PIPERACILLIN-TAZOBACTAM 3.375 G IVPB 30 MIN
3.3750 g | Freq: Once | INTRAVENOUS | Status: AC
  Administered 2019-10-09: 3.375 g via INTRAVENOUS
  Filled 2019-10-09: qty 50

## 2019-10-09 MED ORDER — HYDROCODONE-ACETAMINOPHEN 5-325 MG PO TABS
1.0000 | ORAL_TABLET | Freq: Once | ORAL | Status: AC
  Administered 2019-10-09: 1 via ORAL
  Filled 2019-10-09: qty 1

## 2019-10-09 MED ORDER — VANCOMYCIN HCL 1500 MG/300ML IV SOLN
1500.0000 mg | INTRAVENOUS | Status: DC
  Filled 2019-10-09: qty 300

## 2019-10-09 NOTE — H&P (Signed)
H+P    Reason for Consult:  Left foot pain Referring Physician:  Dr. Vanita Panda MRN #:  [id number]  History of Present Illness: This is a 59 y.o. female underwent angiography with stenting of left SFA minx device closure right common femoral artery on 5/28.  At the time she was admitted for CHF exacerbation.  After angiography she was discharged and CHF had been better controlled.  She states approximately 3 to 4 days ago her left lower extremity had a blister that then popped.  She has had subsequent significant pain in the foot and the leg.  She has still been walking although this has been less than usual given the pain.  Pain mostly in the foot is constant and unrelenting she has not had any relief.  She states it is mostly in the area of the blister and also on the medial aspect of the left foot where there is another wound.  She does not have any numbness or tingling in the foot.  She has not eaten today.  Patient is taking lipitor and Plavix does not take any anticoagulation.  She states that she has never had a stroke.  She does have some blood in her stools recently.  She continues to smoke occasionally.  Past Medical History:  Diagnosis Date  . Alcohol dependence ([place])   . Anxiety   . Breast cancer ([place])   . CHF (congestive heart failure) ([place])   . Cigarette nicotine dependence   . Colon polyps   . COPD (chronic obstructive pulmonary disease) ([place])   . Diabetes mellitus without complication ([place])   . Diabetic neuropathy ([place])   . Elevated lipase   . Gout   . Hyperlipemia   . Hypertension   . Insomnia   . Lymphedema   . Marijuana use   . OSA treated with BiPAP   . PAD (peripheral artery disease) ([place])   . Sleep apnea    wears BIPAP  . Ulcer of foot ([place])    right    Past Surgical History:  Procedure Laterality Date  . ABDOMINAL AORTOGRAM W/LOWER EXTREMITY N/A 09/26/2019   Procedure: ABDOMINAL AORTOGRAM W/LOWER EXTREMITY;  Surgeon: Angelia Mould, MD;  Location:  [facility];  Service: Cardiovascular;  Laterality: N/A;  . ABDOMINAL HYSTERECTOMY    . [facility]  . MASTECTOMY Right April 2016  . PERIPHERAL VASCULAR INTERVENTION Left 09/26/2019   Procedure: PERIPHERAL VASCULAR INTERVENTION;  Surgeon: Angelia Mould, MD;  Location: [facility];  Service: Cardiovascular;  Laterality: Left;  superficial femoral    Allergies  Allergen Reactions  . Nsaids Other (See Comments)    Pancreatitis  . Tolmetin Other (See Comments)    Pancreatitis  . Aspirin Other (See Comments)    "Makes my pancreas act up"   . Tramadol Swelling    Prior to Admission medications   Medication Sig Start Date End Date Taking? Authorizing Provider  acetaminophen (TYLENOL) 325 MG tablet Take 2 tablets (650 mg total) by mouth every 4 (four) hours as needed for headache or mild pain. 09/27/19 10/27/19 Yes Brimage, Vondra, DO  albuterol (PROAIR HFA) 108 (90 Base) MCG/ACT inhaler Inhale 2 puffs into the lungs every 6 (six) hours as needed for shortness of breath.  05/19/14  Yes [provider]  amitriptyline (ELAVIL) 10 MG tablet Take 5 tablets (50 mg total) by mouth at bedtime. 02/19/19   Armbruster, Carlota Raspberry, MD  atorvastatin (LIPITOR) 40 MG tablet  Take 40 mg by mouth daily.    [provider]  budesonide-formoterol (SYMBICORT) 80-4.5 MCG/ACT inhaler Inhale 2 puffs into the lungs 2 (two) times daily. 12/11/18   [provider]  clopidogrel (PLAVIX) 75 MG tablet Take 1 tablet (75 mg total) by mouth daily. 09/27/19 09/26/20  Lyndee Hensen, DO  dapagliflozin propanediol (FARXIGA) 10 MG TABS tablet Take 1 tablet (10 mg total) by mouth daily before breakfast. 09/26/19   Angelia Mould, MD  DILT-XR 120 MG 24 hr capsule Take 120 mg by mouth daily. 06/03/19   [provider]  gabapentin (NEURONTIN) 800 MG tablet Take 800 mg by mouth 3 (three) times daily.    [provider]  glimepiride (AMARYL) 4 MG  tablet Take 8 mg by mouth daily with breakfast.     [provider]  insulin glargine (LANTUS) 100 UNIT/ML injection Inject 0.4 mLs (40 Units total) into the skin daily. 09/27/19   Brimage, Ronnette Juniper, DO  insulin lispro (HUMALOG) 100 UNIT/ML injection Inject 14 Units into the skin 3 (three) times daily before meals.    [provider]  lipase/protease/amylase (CREON) 36000 UNITS CPEP capsule Take 2 capsule by mouth with each meal and 1 capsule by mouth with each snack 09/17/19   Armbruster, Carlota Raspberry, MD  losartan (COZAAR) 50 MG tablet Take 50 mg by mouth daily. 02/11/19   [provider]  MYRBETRIQ 50 MG TB24 tablet Take 50 mg by mouth daily. 06/03/19   [provider]  potassium chloride (K-DUR) 10 MEQ tablet Take 10 mEq by mouth daily as needed (For cramps).     [provider]  Toremifene Citrate (FARESTON) 60 MG tablet Take 1 tablet (60 mg total) by mouth daily. 09/27/19   Lyndee Hensen, DO  torsemide (DEMADEX) 20 MG tablet Take 2 tablets (40 mg total) by mouth daily. 09/27/19 10/27/19  Lyndee Hensen, DO    Social History   Socioeconomic History  . Marital status: Single    Spouse name: Not on file  . Number of children: Not on file  . Years of education: Not on file  . Highest education level: Not on file  Occupational History  . Not on file  Tobacco Use  . Smoking status: Light Tobacco Smoker    Types: Cigarettes    Last attempt to quit: 06/04/2019    Years since quitting: 0.3  . Smokeless tobacco: Never Used  Vaping Use  . Vaping Use: Never used  Substance and Sexual Activity  . Alcohol use: Not Currently    Comment: Beer - "on the weekends hanging out, maybe 2 or 3"   . Drug use: Not Currently    Types: Marijuana    Comment: last used 8 2020  . Sexual activity: Not on file  Other Topics Concern  . Not on file  Social History Narrative  . Not on file   Social Determinants of Health   Financial Resource Strain:   . Difficulty of  Paying Living Expenses:   Food Insecurity:   . Worried About Charity fundraiser in the Last Year:   . Arboriculturist in the Last Year:   Transportation Needs:   . Film/video editor (Medical):   Marland Kitchen Lack of Transportation (Non-Medical):   Physical Activity:   . Days of Exercise per Week:   . Minutes of Exercise per Session:   Stress:   . Feeling of Stress :   Social Connections:   . Frequency of Communication  with Friends and Family:   . Frequency of Social Gatherings with Friends and Family:   . Attends Religious Services:   . Active Member of Clubs or Organizations:   . Attends Archivist Meetings:   Marland Kitchen Marital Status:   Intimate Partner Violence:   . Fear of Current or Ex-Partner:   . Emotionally Abused:   Marland Kitchen Physically Abused:   . Sexually Abused:     Family History  Problem Relation Age of Onset  . Diabetes Other   . Heart disease Other   . Breast cancer Maternal Grandmother   . Breast cancer Paternal Grandmother   . Stroke Son   . Colon cancer Neg Hx   . Esophageal cancer Neg Hx   . Rectal cancer Neg Hx   . Stomach cancer Neg Hx     ROS:  Cardiovascular: []  chest pain/pressure []  palpitations []  SOB lying flat []  DOE []  pain in legs while walking []  pain in legs at rest [x]  pain in legs at night []  non-healing ulcers []  hx of DVT [x]  swelling in legs  Pulmonary: []  productive cough []  asthma/wheezing []  home O2  Neurologic: []  weakness in []  arms []  legs []  numbness in []  arms []  legs []  hx of CVA []  mini stroke [] difficulty speaking or slurred speech []  temporary loss of vision in one eye []  dizziness  Hematologic: []  hx of cancer []  bleeding problems []  problems with blood clotting easily  Endocrine:   []  diabetes []  thyroid disease  GI []  vomiting blood []  blood in stool  GU: []  CKD/renal failure []  HD--[]  M/W/F or []  T/T/S []  burning with urination []  blood in urine  Psychiatric: []  anxiety []   depression  Musculoskeletal: []  arthritis []  joint pain  Integumentary: []  rashes [x]  ulcers  Constitutional: []  fever []  chills   Physical Examination  Vitals:   10/09/19 1905 10/09/19 1906  BP:    Pulse: 94 95  Resp: 19 19  Temp:    SpO2: 98% 99%   There is no height or weight on file to calculate BMI.  General:  nad HENT: WNL, normocephalic Pulmonary: normal non-labored breathing, not on oxygen Cardiac: Palpable femoral pulses bilaterally No palpable popliteal pulses Weak monophasic dorsalis pedis and peroneal artery signals bilaterally Abdomen:  soft, NT/ND, no masses Extremities: Mild tenderness palpation right groin Right foot does have some dark discoloration of the toes Left foot with apparent ruptured bulla on the dorsum as well as the medial aspect of the foot these are severely tender to evaluation Multiple dry appearing chronic lesions throughout the lower legs Neurologic: A&O X 3; she has sensation and motor intact bilateral lower extremities.   CBC    Component Value Date/Time   WBC 8.8 10/09/2019 1710   RBC 4.90 10/09/2019 1710   HGB 13.3 10/09/2019 1710   HCT 41.9 10/09/2019 1710   PLT 458 (H) 10/09/2019 1710   MCV 85.5 10/09/2019 1710   MCH 27.1 10/09/2019 1710   MCHC 31.7 10/09/2019 1710   RDW 17.2 (H) 10/09/2019 1710   LYMPHSABS 2.8 10/09/2019 1710   MONOABS 0.8 10/09/2019 1710   EOSABS 0.2 10/09/2019 1710   BASOSABS 0.0 10/09/2019 1710    BMET    Component Value Date/Time   NA 139 10/09/2019 1710   K 3.5 10/09/2019 1710   CL 102 10/09/2019 1710   CO2 27 10/09/2019 1710   GLUCOSE 104 (H) 10/09/2019 1710   BUN 10 10/09/2019 1710   CREATININE 1.27 (H) 10/09/2019  1710   CALCIUM 8.9 10/09/2019 1710   GFRNONAA 46 (L) 10/09/2019 1710   GFRAA 54 (L) 10/09/2019 1710    COAGS: No results found for: INR, PROTIME   Non-Invasive Vascular Imaging:   CT aortobifemoral IMPRESSION: 1. Age-indeterminate complete occlusion of the proximal  left SFA. There is complete occlusion of a stent within the proximal-mid left SFA, also age indeterminate. There is reconstitution of the distal left SFA. There is some fat stranding about the left common femoral artery and proximal SFA suggesting this occlusion may be acute. Other possibilities for fat stranding at this location include a left sided DVT. Further evaluation with ultrasound is recommended. 2. Two vessel runoff to the level of the left ankle via the anterior tibial and posterior tibial arteries. 3. Three vessel runoff to the level of the right ankle. 4. Tandem high-grade stenoses are noted in the right SFA as detailed above. 5. Mild to moderate stenosis involving both popliteal arteries. 6. There is nonspecific fat stranding about both lower extremities, left worse than right. 7. Cardiomegaly with small bilateral pleural effusions. There is a partially visualized opacity in the lingula which may represent an area of atelectasis or developing infiltrate.   ASSESSMENT/PLAN: This is a 59 y.o. female underwent recent left SFA stenting for wounds on her bilateral lower extremities.  At the time she had occluded left SFA.  Common femoral appeared healthy.  She had two-vessel runoff via the anterior tibial and peroneal arteries at the time.  This is consistent with today CT scan.  Findings of the CT scan of the right lower extremity appear chronic.  She appears to be back to her baseline level of chronic ischemia of the left lower extremity with preserved sensorimotor function.  She does have a ruptured bullae on the left foot which is quite painful I do not think this is related to an ischemic process but more related to her recent CHF exacerbation.  Both feet are similar temperature and have similarly weak monophasic anterior tibial and peroneal artery signals.  Patient will likely require left lower extremity revascularization given the wounds on her foot at this time.  I have offered  her angiography tonight with possible lysis catheter placement although she has had some minimal blood per rectum.  I have also discussed the possibility of left lower extremity thromboembolectomy which would also likely require angiography with possible repeat stenting.  I discussed with the patient and her daughter that although these procedures would not require significant incisions I am unsure that repeat stenting would have any greater effect than her most recent procedure.  Because of this we discussed possible left common femoral to popliteal artery bypass with either vein or graft.  I have offered the patient to have this procedure tonight although I think that she is mostly at her chronic level of ischemia.  Patient would rather not have it tonight as she desires to eat above all else at this time.    We will get her admitted tonight place her on a heparin drip reevaluate her ABIs and also get bilateral lower extremity greater saphenous vein mapping.  She would likely also benefit from cardiac evaluation given that I do not see any recent evaluation and also she had recent hospitalization for CHF exacerbation and has risk factors of diabetes, hypertension, hyperlipidemia and current smoking.  I discussed the plan in detail with the patient and her daughter via telephone.  They demonstrate good understanding and also that there could be changes  in the plan based on the patient's symptoms.  Deania Siguenza C. Donzetta Matters, MD [facility] Office: [phone] Pager: [phone]

## 2019-10-09 NOTE — ED Notes (Signed)
Pt given ice chips

## 2019-10-09 NOTE — ED Triage Notes (Signed)
Patient arrived via EMS; from [place] office. C/o left foot redness / swelling. Stated was sent to ED for further eval. Rates 10/10 pain.

## 2019-10-09 NOTE — Progress Notes (Signed)
ANTICOAGULATION CONSULT NOTE - Initial Consult  Pharmacy Consult for Heparin Indication: acute on chronic lower extremity ischemia  Allergies  Allergen Reactions  . Nsaids Other (See Comments)    Pancreatitis  . Tolmetin Other (See Comments)    Pancreatitis  . Aspirin Other (See Comments)    "Makes my pancreas act up"   . Tramadol Swelling    Patient Measurements:    Ht: 65 in Wt: 94.9 kg IBW: 57 kg Heparin Dosing Weight: 79 kg  Vital Signs: Temp: 98.5 F (36.9 C) (06/10 1634) Temp Source: Oral (06/10 1634) BP: 117/72 (06/10 2225) Pulse Rate: 89 (06/10 2225)  Labs: Recent Labs    10/09/19 1710  HGB 13.3  HCT 41.9  PLT 458*  CREATININE 1.27*    Estimated Creatinine Clearance: 55 mL/min (A) (by C-G formula based on SCr of 1.27 mg/dL (H)).   Medical History: Past Medical History:  Diagnosis Date  . Alcohol dependence ([place])   . Anxiety   . Breast cancer ([place])   . CHF (congestive heart failure) ([place])   . Cigarette nicotine dependence   . Colon polyps   . COPD (chronic obstructive pulmonary disease) ([place])   . Diabetes mellitus without complication ([place])   . Diabetic neuropathy ([place])   . Elevated lipase   . Gout   . Hyperlipemia   . Hypertension   . Insomnia   . Lymphedema   . Marijuana use   . OSA treated with BiPAP   . PAD (peripheral artery disease) ([place])   . Sleep apnea    wears BIPAP  . Ulcer of foot ([place])    right    Medications:  See electronic med rec  Assessment: 59 y.o. F presents with L foot pain. Pharmacy consulted for heparin for acute on chronic lower extremity ischemia. No AC PTA. CBC ok on admission.  Heparin started by EDP at 1150 units/hr with no bolus.  Goal of Therapy:  Heparin level 0.3-0.7 units/ml Monitor platelets by anticoagulation protocol: Yes   Plan:  Bolus heparin 4000 units Continue heparin at 1150 units/hr Will f/u 6 hr heparin level Daily heparin level and CBC  Sherlon Handing, PharmD, BCPS Please see amion  for complete clinical pharmacist phone list 10/09/2019,11:25 PM

## 2019-10-09 NOTE — ED Notes (Signed)
Pt returned from CT.  Pt resting with eyes closed at this time

## 2019-10-09 NOTE — ED Notes (Signed)
Taken to CT.

## 2019-10-09 NOTE — ED Provider Notes (Signed)
[facility] EMERGENCY DEPARTMENT Provider Note   CSN: [id number] Arrival date & time: 10/09/19  1623     History Chief Complaint  Patient presents with  . Cellulitis    Morgan Beasley is a 59 y.o. female with a past medical history of CHF with EF of 50 to 55%, hypertension, peripheral arterial disease, chronic ulcers of bilateral feet, diabetes, diabetic neuropathy presenting to the ED with a chief complaint of left foot pain, swelling.  States that she noticed for the past 2 to 3 days that she has had worsening redness and pain of the dorsum of the left foot.  She has been putting triple antibiotic ointment cream on this but has not been receiving any other wound care.  She states that she has had bilateral leg swelling since her admission approximately 10 to 12 days ago.  She has been taking her home medications as previously prescribed.  She was seen and evaluated at urgent care, sent to the ED today for further evaluation of possible infection in her left foot.  She has not been taking medications to help with the pain.  She notes some shortness of breath that has been present since her admission last month.  She denies any chest pain, injuries or falls, abdominal pain, vomiting.  HPI     Past Medical History:  Diagnosis Date  . Alcohol dependence ([place])   . Anxiety   . Breast cancer ([place])   . CHF (congestive heart failure) ([place])   . Cigarette nicotine dependence   . Colon polyps   . COPD (chronic obstructive pulmonary disease) ([place])   . Diabetes mellitus without complication ([place])   . Diabetic neuropathy ([place])   . Elevated lipase   . Gout   . Hyperlipemia   . Hypertension   . Insomnia   . Lymphedema   . Marijuana use   . OSA treated with BiPAP   . PAD (peripheral artery disease) ([place])   . Sleep apnea    wears BIPAP  . Ulcer of foot [facility])    right    Patient Active Problem List   Diagnosis Date Noted  . PVD (peripheral vascular disease) ([place])    . Diabetic peripheral neuropathy associated with type 2 diabetes mellitus ([place])   . Acute on chronic diastolic congestive heart failure ([place])   . Lower extremity edema   . Dyspnea 09/21/2019  . Bronchitis 02/25/2019  . Diabetes mellitus ([place]) 02/25/2019  . Peripheral neuropathy 02/25/2019  . COPD (chronic obstructive pulmonary disease) ([place]) 02/25/2019  . Malignant neoplasm of upper-outer quadrant of right breast in female, estrogen receptor positive ([place]) 01/08/2019    Past Surgical History:  Procedure Laterality Date  . ABDOMINAL AORTOGRAM W/LOWER EXTREMITY N/A 09/26/2019   Procedure: ABDOMINAL AORTOGRAM W/LOWER EXTREMITY;  Surgeon: Angelia Mould, MD;  Location: [facility] CV LAB;  Service: Cardiovascular;  Laterality: N/A;  . ABDOMINAL HYSTERECTOMY    . [facility]  . MASTECTOMY Right April 2016  . PERIPHERAL VASCULAR INTERVENTION Left 09/26/2019   Procedure: PERIPHERAL VASCULAR INTERVENTION;  Surgeon: Angelia Mould, MD;  Location: [facility];  Service: Cardiovascular;  Laterality: Left;  superficial femoral     OB History   No obstetric history on file.     Family History  Problem Relation Age of Onset  . Diabetes Other   . Heart disease Other   . Breast cancer Maternal Grandmother   . Breast cancer Paternal Grandmother   .  Stroke Son   . Colon cancer Neg Hx   . Esophageal cancer Neg Hx   . Rectal cancer Neg Hx   . Stomach cancer Neg Hx     Social History   Tobacco Use  . Smoking status: Light Tobacco Smoker    Types: Cigarettes    Last attempt to quit: 06/04/2019    Years since quitting: 0.3  . Smokeless tobacco: Never Used  Vaping Use  . Vaping Use: Never used  Substance Use Topics  . Alcohol use: Not Currently    Comment: Beer - "on the weekends hanging out, maybe 2 or 3"   . Drug use: Not Currently    Types: Marijuana    Comment: last used 8 2020    Home Medications Prior to Admission medications    Medication Sig Start Date End Date Taking? Authorizing Provider  acetaminophen (TYLENOL) 325 MG tablet Take 2 tablets (650 mg total) by mouth every 4 (four) hours as needed for headache or mild pain. 09/27/19 10/27/19 Yes Brimage, Vondra, DO  albuterol (PROAIR HFA) 108 (90 Base) MCG/ACT inhaler Inhale 2 puffs into the lungs every 6 (six) hours as needed for shortness of breath.  05/19/14  Yes [provider]  amitriptyline (ELAVIL) 10 MG tablet Take 5 tablets (50 mg total) by mouth at bedtime. 02/19/19   Armbruster, Carlota Raspberry, MD  atorvastatin (LIPITOR) 40 MG tablet Take 40 mg by mouth daily.    [provider]  budesonide-formoterol (SYMBICORT) 80-4.5 MCG/ACT inhaler Inhale 2 puffs into the lungs 2 (two) times daily. 12/11/18   [provider]  clopidogrel (PLAVIX) 75 MG tablet Take 1 tablet (75 mg total) by mouth daily. 09/27/19 09/26/20  Lyndee Hensen, DO  dapagliflozin propanediol (FARXIGA) 10 MG TABS tablet Take 1 tablet (10 mg total) by mouth daily before breakfast. 09/26/19   Angelia Mould, MD  DILT-XR 120 MG 24 hr capsule Take 120 mg by mouth daily. 06/03/19   [provider]  gabapentin (NEURONTIN) 800 MG tablet Take 800 mg by mouth 3 (three) times daily.    [provider]  glimepiride (AMARYL) 4 MG tablet Take 8 mg by mouth daily with breakfast.     [provider]  insulin glargine (LANTUS) 100 UNIT/ML injection Inject 0.4 mLs (40 Units total) into the skin daily. 09/27/19   Brimage, Ronnette Juniper, DO  insulin lispro (HUMALOG) 100 UNIT/ML injection Inject 14 Units into the skin 3 (three) times daily before meals.    [provider]  lipase/protease/amylase (CREON) 36000 UNITS CPEP capsule Take 2 capsule by mouth with each meal and 1 capsule by mouth with each snack 09/17/19   Armbruster, Carlota Raspberry, MD  losartan (COZAAR) 50 MG tablet Take 50 mg by mouth daily. 02/11/19   [provider]  MYRBETRIQ 50 MG TB24 tablet Take 50 mg  by mouth daily. 06/03/19   [provider]  potassium chloride (K-DUR) 10 MEQ tablet Take 10 mEq by mouth daily as needed (For cramps).     [provider]  Toremifene Citrate (FARESTON) 60 MG tablet Take 1 tablet (60 mg total) by mouth daily. 09/27/19   Lyndee Hensen, DO  torsemide (DEMADEX) 20 MG tablet Take 2 tablets (40 mg total) by mouth daily. 09/27/19 10/27/19  Lyndee Hensen, DO    Allergies    Nsaids, Tolmetin, Aspirin, and Tramadol  Review of Systems   Review of Systems  Constitutional: Negative for appetite change, chills and fever.  HENT: Negative for ear pain,  rhinorrhea, sneezing and sore throat.   Eyes: Negative for photophobia and visual disturbance.  Respiratory: Negative for cough, chest tightness, shortness of breath and wheezing.   Cardiovascular: Negative for chest pain and palpitations.  Gastrointestinal: Negative for abdominal pain, blood in stool, constipation, diarrhea, nausea and vomiting.  Genitourinary: Negative for dysuria, hematuria and urgency.  Musculoskeletal: Negative for myalgias.  Skin: Positive for color change and wound. Negative for rash.  Neurological: Negative for dizziness, weakness and light-headedness.    Physical Exam Updated Vital Signs BP 115/74   Pulse 95   Temp 98.5 F (36.9 C) (Oral)   Resp 19   SpO2 99%   Physical Exam Vitals and nursing note reviewed.  Constitutional:      General: She is not in acute distress.    Appearance: She is well-developed.  HENT:     Head: Normocephalic and atraumatic.     Nose: Nose normal.  Eyes:     General: No scleral icterus.       Left eye: No discharge.     Conjunctiva/sclera: Conjunctivae normal.  Cardiovascular:     Rate and Rhythm: Normal rate and regular rhythm.     Pulses:          Dorsalis pedis pulses are 2+ on the right side and detected w/ Doppler on the left side.     Heart sounds: Normal heart sounds. No murmur heard.  No friction rub. No gallop.    Pulmonary:     Effort: Pulmonary effort is normal. No respiratory distress.     Breath sounds: Normal breath sounds.  Abdominal:     General: Bowel sounds are normal. There is no distension.     Palpations: Abdomen is soft.     Tenderness: There is no abdominal tenderness. There is no guarding.  Musculoskeletal:        General: Normal range of motion.     Cervical back: Normal range of motion and neck supple.  Feet:     Right foot:     Skin integrity: Ulcer and skin breakdown present.     Left foot:     Skin integrity: Ulcer, skin breakdown, erythema and warmth present.  Skin:    General: Skin is warm and dry.     Findings: Erythema present. No rash.  Neurological:     Mental Status: She is alert.     Motor: No abnormal muscle tone.     Coordination: Coordination normal.         ED Results / Procedures / Treatments   Labs (all labs ordered are listed, but only abnormal results are displayed) Labs Reviewed  BASIC METABOLIC PANEL - Abnormal; Notable for the following components:      Result Value   Glucose, Bld 104 (*)    Creatinine, Ser 1.27 (*)    GFR calc non Af Amer 46 (*)    GFR calc Af Amer 54 (*)    All other components within normal limits  CBC WITH DIFFERENTIAL/PLATELET - Abnormal; Notable for the following components:   RDW 17.2 (*)    Platelets 458 (*)    All other components within normal limits  BRAIN NATRIURETIC PEPTIDE - Abnormal; Notable for the following components:   B Natriuretic Peptide 488.5 (*)    All other components within normal limits  CULTURE, BLOOD (ROUTINE X 2)  CULTURE, BLOOD (ROUTINE X 2)  BASIC METABOLIC PANEL  CBC    EKG None  Radiology CT Angio Aortobifemoral W and/or Wo  Contrast  Result Date: 10/09/2019 CLINICAL DATA:  Left foot redness and swelling.  10/10 pain. EXAM: CT ANGIOGRAPHY OF ABDOMINAL AORTA WITH ILIOFEMORAL RUNOFF TECHNIQUE: Multidetector CT imaging of the abdomen, pelvis and lower extremities was performed  using the standard protocol during bolus administration of intravenous contrast. Multiplanar CT image reconstructions and MIPs were obtained to evaluate the vascular anatomy. CONTRAST:  142mL OMNIPAQUE IOHEXOL 350 MG/ML SOLN COMPARISON:  None. FINDINGS: VASCULAR Aorta: There are mild atherosclerotic changes of the aorta without evidence for dissection or aneurysm. Celiac: Patent without evidence of aneurysm, dissection, vasculitis or significant stenosis. There is a replaced left hepatic artery. SMA: Patent without evidence of aneurysm, dissection, vasculitis or significant stenosis. Renals: Both renal arteries are patent without evidence of aneurysm, dissection, vasculitis, fibromuscular dysplasia or significant stenosis. IMA: Patent without evidence of aneurysm, dissection, vasculitis or significant stenosis. RIGHT Lower Extremity Inflow: There are atherosclerotic changes of the common iliac artery, internal iliac artery, and external iliac artery resulting in less than 50% stenosis. Outflow: There is some nonspecific fat stranding about the right common femoral artery. This is of unknown clinical significance. There is moderate stenosis at the origin of the right SFA. There is a high-grade stenosis in the mid right SFA (axial series 5, image 189). More distally, there is an additional high-grade stenosis of the right SFA (axial series 5, image 197). Additional tandem high-grade stenoses are noted in the distal SFA with extension into the proximal above knee popliteal artery. There is mild narrowing of the below knee popliteal artery. Runoff: There is a high-grade stenosis at the origin of the right anterior tibial artery. There is a high-grade stenosis involving the proximal right anterior tibial artery (axial series 5, image 305). There is likely moderate stenosis at the origin of both the right posterior tibial and peroneal arteries. There is essentially a 3 vessel runoff on the right to the level of the  patient's ankle. LEFT Lower Extremity Inflow: There are atherosclerotic changes of the left common, internal, and external iliac arteries without evidence for significant stenosis. There is some fat stranding about the left common femoral artery. Outflow: There is complete occlusion of the proximal SFA. The left profunda femoris artery is patent. There is an occluded stent within the mid and proximal left SFA. There is reconstitution distally of the left SFA. Mild-to-moderate stenosis is noted involving the above knee popliteal artery. There is moderate narrowing of the below knee popliteal artery. Runoff: There is moderate narrowing of the proximal left AT. There is complete occlusion of the proximal peroneal artery. There is essentially a 2 vessel runoff to the level of the ankle via the anterior tibial and posterior tibial arteries. Veins: The veins are suboptimally evaluated on this exam. Review of the MIP images confirms the above findings. NON-VASCULAR Lower chest: There are small bilateral pleural effusions.The heart size is enlarged. There is a partially visualized airspace opacity in the lingula. There is atelectasis at the lung bases. There is mild reflux of contrast into the IVC. Hepatobiliary: The liver is normal. Normal gallbladder.There is no biliary ductal dilation. Pancreas: Normal contours without ductal dilatation. No peripancreatic fluid collection. Spleen: Unremarkable. Adrenals/Urinary Tract: --Adrenal glands: Unremarkable. --Right kidney/ureter: No hydronephrosis or radiopaque kidney stones. --Left kidney/ureter: No hydronephrosis or radiopaque kidney stones. --Urinary bladder: Unremarkable. Stomach/Bowel: --Stomach/Duodenum: No hiatal hernia or other gastric abnormality. Normal duodenal course and caliber. --Small bowel: There are few mildly dilated loops of small bowel scattered throughout the abdomen without evidence for high-grade small bowel  obstruction --Colon: Unremarkable. --Appendix:  Normal. Lymphatic: --No retroperitoneal lymphadenopathy. --No mesenteric lymphadenopathy. --No pelvic or inguinal lymphadenopathy. Reproductive: Status post hysterectomy. No adnexal mass. Other: No ascites or free air. The abdominal wall is normal. Musculoskeletal. There are degenerative changes of both hips, left greater than right. There is no evidence for an acute displaced fracture. There is nonspecific bilateral lower extremity edema, left worse than right. There are degenerative changes of both knees. IMPRESSION: 1. Age-indeterminate complete occlusion of the proximal left SFA. There is complete occlusion of a stent within the proximal-mid left SFA, also age indeterminate. There is reconstitution of the distal left SFA. There is some fat stranding about the left common femoral artery and proximal SFA suggesting this occlusion may be acute. Other possibilities for fat stranding at this location include a left sided DVT. Further evaluation with ultrasound is recommended. 2. Two vessel runoff to the level of the left ankle via the anterior tibial and posterior tibial arteries. 3. Three vessel runoff to the level of the right ankle. 4. Tandem high-grade stenoses are noted in the right SFA as detailed above. 5. Mild to moderate stenosis involving both popliteal arteries. 6. There is nonspecific fat stranding about both lower extremities, left worse than right. 7. Cardiomegaly with small bilateral pleural effusions. There is a partially visualized opacity in the lingula which may represent an area of atelectasis or developing infiltrate. Aortic Atherosclerosis (ICD10-I70.0). Electronically Signed   By: Constance Holster M.D.   On: 10/09/2019 20:11   DG Chest Portable 1 View  Result Date: 10/09/2019 CLINICAL DATA:  Shortness of breath. EXAM: PORTABLE CHEST 1 VIEW COMPARISON:  Sep 22, 2019 FINDINGS: Mild, stable linear atelectasis is seen within the lateral aspect of the mid left lung. A tiny left pleural  effusion is seen. No pneumothorax is identified. The cardiac silhouette is mildly enlarged and unchanged in size. Multilevel degenerative changes seen throughout the thoracic spine. IMPRESSION: 1. Mild, stable mid left lung linear atelectasis. 2. Tiny left pleural effusion. Electronically Signed   By: Virgina Norfolk M.D.   On: 10/09/2019 17:23    Procedures Procedures (including critical care time)  Medications Ordered in ED Medications  piperacillin-tazobactam (ZOSYN) IVPB 3.375 g (has no administration in time range)  vancomycin (VANCOREADY) IVPB 1500 mg/300 mL (has no administration in time range)  HYDROcodone-acetaminophen (NORCO/VICODIN) 5-325 MG per tablet 1 tablet (1 tablet Oral Given 10/09/19 1802)  piperacillin-tazobactam (ZOSYN) IVPB 3.375 g (0 g Intravenous Stopped 10/09/19 1901)  vancomycin (VANCOREADY) IVPB 1500 mg/300 mL (1,500 mg Intravenous New Bag/Given 10/09/19 1904)  iohexol (OMNIPAQUE) 350 MG/ML injection 100 mL (100 mLs Intravenous Contrast Given 10/09/19 1929)    ED Course  I have reviewed the triage vital signs and the nursing notes.  Pertinent labs & imaging results that were available during my care of the patient were reviewed by me and considered in my medical decision making (see chart for details).    MDM Rules/Calculators/A&P                          59 year old female with past medical history of CHF with an EF of 50 to 55%, hypertension, PAD, status post left SFA stent placed last month presenting to the ED with a chief complaint of left foot pain and swelling.  She noticed for the past 2 to 3 days that she had worsening redness and pain on the foot.  She does report bilateral leg swelling since her admission at  the end of last month. Patient reports compliance with her medications including Plavix.  On exam physical exam findings as noted above with erythema and tenderness.  Loss of distal pulses noted but able to faintly locate with doppler on L foot. R foot  pulses are intact distally. She exhibits She reports chronic shortness of breath but nothing different than her baseline.  Oxygen saturations are 99% on room air.  She is afebrile without recent use of antipyretics.  Lab work here significant for BNP of 488.  Chest x-ray with stable findings, small pleural effusion.  Patient started on antibiotics to cover empirically for possible cellulitis.  CT angio with runoff shows possible acute left common femoral artery and proximal SFA occlusion.  Will consult vascular surgery for recommendations.  She will also need to be treated for cellulitis. Patient discussed with and seen by the attending, Dr. Vanita Panda.   Portions of this note were generated with Lobbyist. Dictation errors may occur despite best attempts at proofreading.  Final Clinical Impression(s) / ED Diagnoses Final diagnoses:  PAD (peripheral artery disease) ([place])  Left leg cellulitis    Rx / DC Orders ED Discharge Orders    None       Lulu Riding 10/09/19 2152    Carmin Muskrat, MD 10/09/19 2355

## 2019-10-09 NOTE — ED Notes (Signed)
Pt to CT at this time.

## 2019-10-09 NOTE — Progress Notes (Signed)
Pharmacy Antibiotic Note  Morgan Beasley is a 59 y.o. female admitted on 10/09/2019 with cellulitis.  Pharmacy has been consulted for Zosyn and vancomycin dosing.  Patient is afebrile. No creatinine from today, but has previously had a creatinine ~1.2, eCrCl with that Creatinine ~59 ml/min. WBC count wnl at 8.8.  Plan: Vancomycin 1500 IV every 24 hours.  Goal trough 10-15 mcg/mL. Zosyn 3.375g IV q8h (4 hour infusion).  Follow up clinical status, renal function, de-escalation, LOT  Temp (24hrs), Avg:98.5 F (36.9 C), Min:98.5 F (36.9 C), Max:98.5 F (36.9 C)  No results for input(s): WBC, CREATININE, LATICACIDVEN, VANCOTROUGH, VANCOPEAK, VANCORANDOM, GENTTROUGH, GENTPEAK, GENTRANDOM, TOBRATROUGH, TOBRAPEAK, TOBRARND, AMIKACINPEAK, AMIKACINTROU, AMIKACIN in the last 168 hours.  Estimated Creatinine Clearance: 58.7 mL/min (A) (by C-G formula based on SCr of 1.19 mg/dL (H)).    Allergies  Allergen Reactions  . Nsaids Other (See Comments)    Pancreatitis  . Tolmetin Other (See Comments)    Pancreatitis  . Aspirin Other (See Comments)    "Makes my pancreas act up"   . Tramadol Swelling    Antimicrobials this admission: vanco 6/10 >>  zosyn 6/10 >>   Dose adjustments this admission: N/A  Microbiology results: 6/10 BCx: sent  Thank you for allowing pharmacy to be a part of this patient's care.  Eddie Candle, PharmD PGY-1 Pharmacy Resident   Please check amion for clinical pharmacist contact number  10/09/2019 5:28 PM

## 2019-10-09 NOTE — ED Notes (Signed)
Attempted report x1. 

## 2019-10-10 ENCOUNTER — Inpatient Hospital Stay (HOSPITAL_COMMUNITY): Payer: Medicare Other

## 2019-10-10 ENCOUNTER — Other Ambulatory Visit: Payer: Self-pay

## 2019-10-10 DIAGNOSIS — L0390 Cellulitis, unspecified: Secondary | ICD-10-CM

## 2019-10-10 DIAGNOSIS — I2510 Atherosclerotic heart disease of native coronary artery without angina pectoris: Secondary | ICD-10-CM

## 2019-10-10 DIAGNOSIS — Z01810 Encounter for preprocedural cardiovascular examination: Secondary | ICD-10-CM

## 2019-10-10 DIAGNOSIS — I739 Peripheral vascular disease, unspecified: Secondary | ICD-10-CM

## 2019-10-10 DIAGNOSIS — I5032 Chronic diastolic (congestive) heart failure: Secondary | ICD-10-CM

## 2019-10-10 LAB — GLUCOSE, CAPILLARY
Glucose-Capillary: 178 mg/dL — ABNORMAL HIGH (ref 70–99)
Glucose-Capillary: 142 mg/dL — ABNORMAL HIGH (ref 70–99)
Glucose-Capillary: 124 mg/dL — ABNORMAL HIGH (ref 70–99)
Glucose-Capillary: 107 mg/dL — ABNORMAL HIGH (ref 70–99)

## 2019-10-10 LAB — BASIC METABOLIC PANEL
Anion gap: 11 (ref 5–15)
BUN: 9 mg/dL (ref 6–20)
CO2: 22 mmol/L (ref 22–32)
Calcium: 8.5 mg/dL — ABNORMAL LOW (ref 8.9–10.3)
Chloride: 101 mmol/L (ref 98–111)
Creatinine, Ser: 1.25 mg/dL — ABNORMAL HIGH (ref 0.44–1.00)
GFR calc Af Amer: 55 mL/min — ABNORMAL LOW (ref >60)
GFR calc non Af Amer: 47 mL/min — ABNORMAL LOW (ref >60)
Glucose, Bld: 207 mg/dL — ABNORMAL HIGH (ref 70–99)
Potassium: 4.2 mmol/L (ref 3.5–5.1)
Sodium: 134 mmol/L — ABNORMAL LOW (ref 135–145)

## 2019-10-10 LAB — CBC
HCT: 43.2 % (ref 36.0–46.0)
Hemoglobin: 13.3 g/dL (ref 12.0–15.0)
MCH: 26.5 pg (ref 26.0–34.0)
MCHC: 30.8 g/dL (ref 30.0–36.0)
MCV: 86.2 fL (ref 80.0–100.0)
Platelets: 464 10*3/uL — ABNORMAL HIGH (ref 150–400)
RBC: 5.01 MIL/uL (ref 3.87–5.11)
RDW: 17.3 % — ABNORMAL HIGH (ref 11.5–15.5)
WBC: 9 10*3/uL (ref 4.0–10.5)
nRBC: 0.2 % (ref 0.0–0.2)

## 2019-10-10 LAB — HEPARIN LEVEL (UNFRACTIONATED)
Heparin Unfractionated: 0.37 IU/mL (ref 0.30–0.70)
Heparin Unfractionated: <0.1 IU/mL — ABNORMAL LOW (ref 0.30–0.70)
Heparin Unfractionated: 0.54 IU/mL (ref 0.30–0.70)

## 2019-10-10 MED ORDER — HEPARIN BOLUS VIA INFUSION
3000.0000 [IU] | Freq: Once | INTRAVENOUS | Status: AC
  Administered 2019-10-10: 3000 [IU] via INTRAVENOUS
  Filled 2019-10-10: qty 3000

## 2019-10-10 MED ORDER — OXYCODONE-ACETAMINOPHEN 5-325 MG PO TABS
1.0000 | ORAL_TABLET | ORAL | Status: DC | PRN
  Administered 2019-10-10: 1 via ORAL
  Administered 2019-10-11 – 2019-10-13 (×6): 2 via ORAL
  Administered 2019-10-13 (×2): 1 via ORAL
  Administered 2019-10-13 – 2019-10-21 (×26): 2 via ORAL
  Filled 2019-10-10 (×4): qty 2
  Filled 2019-10-10: qty 1
  Filled 2019-10-10 (×19): qty 2
  Filled 2019-10-10: qty 1
  Filled 2019-10-10 (×11): qty 2

## 2019-10-10 MED ORDER — DILTIAZEM HCL ER COATED BEADS 120 MG PO CP24
120.0000 mg | ORAL_CAPSULE | Freq: Every day | ORAL | Status: DC
  Administered 2019-10-10 – 2019-10-13 (×4): 120 mg via ORAL
  Filled 2019-10-10 (×4): qty 1

## 2019-10-10 MED ORDER — LOSARTAN POTASSIUM 50 MG PO TABS
50.0000 mg | ORAL_TABLET | Freq: Every day | ORAL | Status: DC
  Administered 2019-10-10 – 2019-10-21 (×12): 50 mg via ORAL
  Filled 2019-10-10 (×13): qty 1

## 2019-10-10 MED ORDER — SODIUM CHLORIDE 0.9 % IV SOLN: INTRAVENOUS | Status: DC

## 2019-10-10 MED ORDER — PHENOL 1.4 % MT LIQD: 1.0000 | OROMUCOSAL | Status: DC | PRN

## 2019-10-10 MED ORDER — HYDROMORPHONE HCL 1 MG/ML IJ SOLN
0.5000 mg | INTRAMUSCULAR | Status: DC | PRN
  Administered 2019-10-10 – 2019-10-21 (×25): 1 mg via INTRAVENOUS
  Filled 2019-10-10 (×28): qty 1

## 2019-10-10 MED ORDER — GABAPENTIN 400 MG PO CAPS
800.0000 mg | ORAL_CAPSULE | Freq: Three times a day (TID) | ORAL | Status: DC
  Administered 2019-10-10 – 2019-10-21 (×32): 800 mg via ORAL
  Filled 2019-10-10 (×32): qty 2

## 2019-10-10 MED ORDER — POTASSIUM CHLORIDE CRYS ER 20 MEQ PO TBCR
20.0000 meq | EXTENDED_RELEASE_TABLET | Freq: Once | ORAL | Status: AC
  Administered 2019-10-10: 20 meq via ORAL
  Filled 2019-10-10: qty 1

## 2019-10-10 MED ORDER — ATORVASTATIN CALCIUM 40 MG PO TABS
40.0000 mg | ORAL_TABLET | Freq: Every day | ORAL | Status: DC
  Administered 2019-10-10 – 2019-10-20 (×11): 40 mg via ORAL
  Filled 2019-10-10 (×11): qty 1

## 2019-10-10 MED ORDER — ALBUTEROL SULFATE (2.5 MG/3ML) 0.083% IN NEBU: 2.5000 mg | INHALATION_SOLUTION | Freq: Four times a day (QID) | RESPIRATORY_TRACT | Status: DC | PRN

## 2019-10-10 MED ORDER — ALUM & MAG HYDROXIDE-SIMETH 200-200-20 MG/5ML PO SUSP: 15.0000 mL | ORAL | Status: DC | PRN

## 2019-10-10 MED ORDER — INSULIN GLARGINE 100 UNIT/ML ~~LOC~~ SOLN
20.0000 [IU] | Freq: Every day | SUBCUTANEOUS | Status: DC
  Administered 2019-10-10 – 2019-10-20 (×11): 20 [IU] via SUBCUTANEOUS
  Filled 2019-10-10 (×14): qty 0.2

## 2019-10-10 MED ORDER — AMITRIPTYLINE HCL 50 MG PO TABS
50.0000 mg | ORAL_TABLET | Freq: Every day | ORAL | Status: DC
  Administered 2019-10-10 – 2019-10-20 (×12): 50 mg via ORAL
  Filled 2019-10-10 (×12): qty 1

## 2019-10-10 MED ORDER — GUAIFENESIN-DM 100-10 MG/5ML PO SYRP: 15.0000 mL | ORAL_SOLUTION | ORAL | Status: DC | PRN

## 2019-10-10 MED ORDER — INSULIN ASPART 100 UNIT/ML ~~LOC~~ SOLN
0.0000 [IU] | Freq: Three times a day (TID) | SUBCUTANEOUS | Status: DC
  Administered 2019-10-10: 4 [IU] via SUBCUTANEOUS
  Administered 2019-10-10 (×2): 3 [IU] via SUBCUTANEOUS
  Administered 2019-10-11 (×2): 4 [IU] via SUBCUTANEOUS
  Administered 2019-10-11: 3 [IU] via SUBCUTANEOUS
  Administered 2019-10-12 (×3): 4 [IU] via SUBCUTANEOUS
  Administered 2019-10-13: 3 [IU] via SUBCUTANEOUS
  Administered 2019-10-13: 4 [IU] via SUBCUTANEOUS
  Administered 2019-10-13: 7 [IU] via SUBCUTANEOUS
  Administered 2019-10-14 – 2019-10-15 (×3): 4 [IU] via SUBCUTANEOUS
  Administered 2019-10-16: 3 [IU] via SUBCUTANEOUS
  Administered 2019-10-17: 4 [IU] via SUBCUTANEOUS
  Administered 2019-10-17: 7 [IU] via SUBCUTANEOUS
  Administered 2019-10-18 – 2019-10-21 (×5): 4 [IU] via SUBCUTANEOUS

## 2019-10-10 MED ORDER — CLOPIDOGREL BISULFATE 75 MG PO TABS
75.0000 mg | ORAL_TABLET | Freq: Every day | ORAL | Status: DC
  Administered 2019-10-10 – 2019-10-21 (×12): 75 mg via ORAL
  Filled 2019-10-10 (×12): qty 1

## 2019-10-10 MED ORDER — INSULIN ASPART 100 UNIT/ML ~~LOC~~ SOLN
6.0000 [IU] | Freq: Three times a day (TID) | SUBCUTANEOUS | Status: DC
  Administered 2019-10-10 – 2019-10-20 (×15): 6 [IU] via SUBCUTANEOUS

## 2019-10-10 MED ORDER — PANTOPRAZOLE SODIUM 40 MG PO TBEC
40.0000 mg | DELAYED_RELEASE_TABLET | Freq: Every day | ORAL | Status: DC
  Administered 2019-10-10 – 2019-10-21 (×12): 40 mg via ORAL
  Filled 2019-10-10 (×12): qty 1

## 2019-10-10 MED ORDER — HYDRALAZINE HCL 20 MG/ML IJ SOLN: 5.0000 mg | INTRAMUSCULAR | Status: DC | PRN

## 2019-10-10 MED ORDER — ONDANSETRON HCL 4 MG/2ML IJ SOLN: 4.0000 mg | Freq: Four times a day (QID) | INTRAMUSCULAR | Status: DC | PRN

## 2019-10-10 MED ORDER — INSULIN ASPART 100 UNIT/ML ~~LOC~~ SOLN
0.0000 [IU] | Freq: Every day | SUBCUTANEOUS | Status: DC
  Administered 2019-10-11: 2 [IU] via SUBCUTANEOUS

## 2019-10-10 MED ORDER — MOMETASONE FURO-FORMOTEROL FUM 100-5 MCG/ACT IN AERO
2.0000 | INHALATION_SPRAY | Freq: Two times a day (BID) | RESPIRATORY_TRACT | Status: DC
  Administered 2019-10-10 – 2019-10-20 (×20): 2 via RESPIRATORY_TRACT
  Filled 2019-10-10 (×3): qty 8.8

## 2019-10-10 MED ORDER — HEPARIN (PORCINE) 25000 UT/250ML-% IV SOLN
13.0000 [IU]/kg/h | INTRAVENOUS | Status: DC
  Administered 2019-10-10 – 2019-10-11 (×2): 13 [IU]/kg/h via INTRAVENOUS
  Filled 2019-10-10 (×4): qty 250

## 2019-10-10 MED ORDER — DOCUSATE SODIUM 100 MG PO CAPS
100.0000 mg | ORAL_CAPSULE | Freq: Two times a day (BID) | ORAL | Status: DC
  Administered 2019-10-10 – 2019-10-20 (×20): 100 mg via ORAL
  Filled 2019-10-10 (×22): qty 1

## 2019-10-10 MED ORDER — POTASSIUM CHLORIDE CRYS ER 10 MEQ PO TBCR: 10.0000 meq | EXTENDED_RELEASE_TABLET | Freq: Every day | ORAL | Status: DC | PRN

## 2019-10-10 MED ORDER — FUROSEMIDE 10 MG/ML IJ SOLN
80.0000 mg | Freq: Once | INTRAMUSCULAR | Status: AC
  Administered 2019-10-10: 80 mg via INTRAVENOUS
  Filled 2019-10-10: qty 8

## 2019-10-10 MED ORDER — METOPROLOL TARTRATE 5 MG/5ML IV SOLN: 2.0000 mg | INTRAVENOUS | Status: DC | PRN

## 2019-10-10 MED ORDER — MIRABEGRON ER 50 MG PO TB24
50.0000 mg | ORAL_TABLET | Freq: Every day | ORAL | Status: DC
  Administered 2019-10-10 – 2019-10-21 (×12): 50 mg via ORAL
  Filled 2019-10-10: qty 1
  Filled 2019-10-10: qty 2
  Filled 2019-10-10: qty 1
  Filled 2019-10-10: qty 2
  Filled 2019-10-10 (×6): qty 1
  Filled 2019-10-10 (×2): qty 2

## 2019-10-10 MED ORDER — TORSEMIDE 20 MG PO TABS
40.0000 mg | ORAL_TABLET | Freq: Every day | ORAL | Status: DC
  Administered 2019-10-10 – 2019-10-21 (×12): 40 mg via ORAL
  Filled 2019-10-10 (×12): qty 2

## 2019-10-10 MED ORDER — LABETALOL HCL 5 MG/ML IV SOLN: 10.0000 mg | INTRAVENOUS | Status: DC | PRN

## 2019-10-10 NOTE — Progress Notes (Signed)
Pt transported via bed to vascular department for vein mapping procedure. Heparin drip continue to infuse.

## 2019-10-10 NOTE — Progress Notes (Signed)
Pt transported back to room for vascular without incident. Heparin drip continues to infuse

## 2019-10-10 NOTE — Progress Notes (Addendum)
ANTICOAGULATION CONSULT NOTE - Initial Consult  Pharmacy Consult for Heparin Indication: acute on chronic lower extremity ischemia  Allergies  Allergen Reactions  . Nsaids Other (See Comments)    Pancreatitis  . Tolmetin Other (See Comments)    Pancreatitis  . Aspirin Other (See Comments)    "Makes my pancreas act up"   . Tramadol Swelling    Patient Measurements:    Ht: 65 in Wt: 94.9 kg IBW: 57 kg Heparin Dosing Weight: 79 kg  Vital Signs: Temp: 97.7 F (36.5 C) (06/11 1020) Temp Source: Oral (06/11 1020) BP: 122/86 (06/11 1020) Pulse Rate: 98 (06/11 1020)  Labs: Recent Labs    10/09/19 1710 10/10/19 0709 10/10/19 1208  HGB 13.3 13.3  --   HCT 41.9 43.2  --   PLT 458* 464*  --   HEPARINUNFRC  --  0.37 <0.10*  CREATININE 1.27* 1.25*  --     Estimated Creatinine Clearance: 55.9 mL/min (A) (by C-G formula based on SCr of 1.25 mg/dL (H)).   Medical History: Past Medical History:  Diagnosis Date  . Alcohol dependence ([place])   . Anxiety   . Breast cancer ([place])   . CHF (congestive heart failure) ([place])   . Cigarette nicotine dependence   . Colon polyps   . COPD (chronic obstructive pulmonary disease) ([place])   . Diabetes mellitus without complication ([place])   . Diabetic neuropathy ([place])   . Elevated lipase   . Gout   . Hyperlipemia   . Hypertension   . Insomnia   . Lymphedema   . Marijuana use   . OSA treated with BiPAP   . PAD (peripheral artery disease) ([place])   . Sleep apnea    wears BIPAP  . Ulcer of foot ([place])    right    Medications:  See electronic med rec  Assessment: 59 y.o. F presents with L foot pain. Pharmacy consulted for heparin for acute on chronic lower extremity ischemia. No AC PTA. CBC ok on admission.  Heparin started by EDP at 1150 units/hr with no bolus. Heparin 4000 units bolus then continue heparin at 1150 units/hr ordered by pharmacist last night  This morning's 6hr heparin level therapeutic with heparin infusing at 1150  units/hr 1200 Repeat confirmatory heparin level subtherapeutic.  Nurse stated that peripheral IV is in [facility] area and has become occluded today due to patient's position of arm.  Nurse unable to use patient's right arm for IV access.   No bleeding noted Patient off floor for imaging study this afternoon    Goal of Therapy:  Heparin level 0.3-0.7 units/ml Monitor platelets by anticoagulation protocol: Yes   Plan:  Entered IV Team consult to access IV site and possibly move IV site Heparin 3000 units bolus then continue heparin at 1250 units/hr Will f/u 6 hr heparin level Daily heparin level and CBC  Shela Commons, PharmD, BCPS Please see amion for complete clinical pharmacist phone list 10/10/2019,1:32 PM

## 2019-10-10 NOTE — Progress Notes (Signed)
ANTICOAGULATION CONSULT NOTE - Initial Consult  Pharmacy Consult for Heparin Indication: acute on chronic lower extremity ischemia  Allergies  Allergen Reactions  . Nsaids Other (See Comments)    Pancreatitis  . Tolmetin Other (See Comments)    Pancreatitis  . Aspirin Other (See Comments)    "Makes my pancreas act up"   . Tramadol Swelling    Patient Measurements:    Ht: 65 in Wt: 94.9 kg IBW: 57 kg Heparin Dosing Weight: 79 kg  Vital Signs: Temp: 97.9 F (36.6 C) (06/11 1948) Temp Source: Oral (06/11 1511) BP: 118/74 (06/11 1948) Pulse Rate: 86 (06/11 1948)  Labs: Recent Labs    10/09/19 1710 10/10/19 0709 10/10/19 1208 10/10/19 2133  HGB 13.3 13.3  --   --   HCT 41.9 43.2  --   --   PLT 458* 464*  --   --   HEPARINUNFRC  --  0.37 <0.10* 0.54  CREATININE 1.27* 1.25*  --   --     Estimated Creatinine Clearance: 55.9 mL/min (A) (by C-G formula based on SCr of 1.25 mg/dL (H)).   Medical History: Past Medical History:  Diagnosis Date  . Alcohol dependence ([place])   . Anxiety   . Breast cancer ([place])   . CHF (congestive heart failure) ([place])   . Cigarette nicotine dependence   . Colon polyps   . COPD (chronic obstructive pulmonary disease) ([place])   . Diabetes mellitus without complication ([place])   . Diabetic neuropathy ([place])   . Elevated lipase   . Gout   . Hyperlipemia   . Hypertension   . Insomnia   . Lymphedema   . Marijuana use   . OSA treated with BiPAP   . PAD (peripheral artery disease) ([place])   . Sleep apnea    wears BIPAP  . Ulcer of foot ([place])    right    Medications:  See electronic med rec  Assessment: 58 y.o. F presents with L foot pain. Pharmacy consulted for heparin for acute on chronic lower extremity ischemia. No AC PTA. CBC ok on admission.  Heparin level this evening is therapeutic (HL 0.54). No overt bleeding noted.   Goal of Therapy:  Heparin level 0.3-0.7 units/ml Monitor platelets by anticoagulation protocol: Yes    Plan:  - Continue Heparin at 1250 units/hr (12.5 ml/hr) - Will continue to monitor for any signs/symptoms of bleeding and will follow up with heparin level in the a.m to confirm therapeutic   Thank you for allowing pharmacy to be a part of this patient's care.  Alycia Rossetti, PharmD, BCPS Clinical Pharmacist Clinical phone for 10/10/2019: [phone] 10/10/2019 10:49 PM   **Pharmacist phone directory can now be found on [email or web address] ([email or web address]).  Listed under [place].

## 2019-10-10 NOTE — Consult Note (Addendum)
Cardiology Consultation:   Patient ID: Morgan Beasley; [id number]; 1960-11-08   Admit date: 10/09/2019 Date of Consult: 10/10/2019  Primary Care Provider: Sonia Side., FNP Primary Cardiologist: New to [facility]; Dr. Harrington Challenger Primary Electrophysiologist:  None  Patient Profile:   Morgan Beasley is a 59 y.o. female with a PMH of non-obstructive CAD, chronic diastolic CHF, HTN, HLD, PAD s/p L SFA stenting 09/26/19, DM type 2, Gout, COPD, tobacco and ETOH abuse, breast cancer s/p right mastectomy, and OSA on BiPAP,  who is being seen today for the evaluation of preoperative assessment at the request of Dr. Donzetta Matters.  History of Present Illness:   Morgan Beasley presented to the ED 10/09/19 with complaints of LLE pain. Prior to admission she had a blister to her LLE that popped with subsequent constant foot pain. She was seen by vascular surgery in the ED who felt her ruptured bullae was more likely related to her recent CHF exacerbation, however, that from a wound healing standpoint, she would benefit from revascularization. She was recommended to undergo left common femoral to popliteal artery bypass.   Patient was recently admitted 09/21/19-09/27/19 after presenting with SOB and LE edema, felt to be 2/2 acute on chronic diastolic CHF. Echo showed EF 50-55%, G2DD, and no significant valvular abnormalities. She was diuresed with IV lasix and discharged home on torsemide 40mg  daily. She was seen by vascular surgery that admission for abnormal ABI's c/w severe PAD and she ultimately underwent left SFA stenting with plans to discuss right SFA occlusion at outpatient follow-up. Her hospital course was also complicated by cellulitis managed with po antibiotics.   She has a history of CAD with last ischemic evaluation being a LHC in 2016 to evaluate an NSTEMI in the setting of cocaine use which showed 50% RCA stenosis, otherwise minimal LAD and LCx stenosis. It does not appear that she has followed  with cardiology since that time.   At the time of this evaluation she reports ongoing severe leg pain. She denies any chest pain, though on further questioning she does note chest pressure from time to time. Despite several attempts to determine the characteristics of her chest pressure it is still unclear whether this is worse with exertion. She has chronic DOE which she thinks is worse in recent months. She has orthopnea and LE edema. Leg pain is her most prominent activity limiting factor. She cannot walk far, go up stairs, or She has no complaints of palpitations, dizziness, lightheadedness, or syncope. Suspect she is withholding information for fear she will not be able to have her leg surgery. She states several times "please let me get my leg fixed".    Past Medical History:  Diagnosis Date  . Alcohol dependence ([place])   . Anxiety   . Breast cancer ([place])   . CHF (congestive heart failure) ([place])   . Cigarette nicotine dependence   . Colon polyps   . COPD (chronic obstructive pulmonary disease) ([place])   . Diabetes mellitus without complication ([place])   . Diabetic neuropathy ([place])   . Elevated lipase   . Gout   . Hyperlipemia   . Hypertension   . Insomnia   . Lymphedema   . Marijuana use   . OSA treated with BiPAP   . PAD (peripheral artery disease) ([place])   . Sleep apnea    wears BIPAP  . Ulcer of foot ([place])    right    Past Surgical History:  Procedure Laterality Date  .  ABDOMINAL AORTOGRAM W/LOWER EXTREMITY N/A 09/26/2019   Procedure: ABDOMINAL AORTOGRAM W/LOWER EXTREMITY;  Surgeon: Angelia Mould, MD;  Location: [facility];  Service: Cardiovascular;  Laterality: N/A;  . ABDOMINAL HYSTERECTOMY    . [facility]  . MASTECTOMY Right April 2016  . PERIPHERAL VASCULAR INTERVENTION Left 09/26/2019   Procedure: PERIPHERAL VASCULAR INTERVENTION;  Surgeon: Angelia Mould, MD;  Location: [facility];  Service: Cardiovascular;   Laterality: Left;  superficial femoral     Home Medications:  Prior to Admission medications   Medication Sig Start Date End Date Taking? Authorizing Provider  acetaminophen (TYLENOL) 325 MG tablet Take 2 tablets (650 mg total) by mouth every 4 (four) hours as needed for headache or mild pain. 09/27/19 10/27/19 Yes Brimage, Vondra, DO  albuterol (PROAIR HFA) 108 (90 Base) MCG/ACT inhaler Inhale 2 puffs into the lungs every 6 (six) hours as needed for shortness of breath.  05/19/14  Yes [provider]  amitriptyline (ELAVIL) 10 MG tablet Take 5 tablets (50 mg total) by mouth at bedtime. 02/19/19   Armbruster, Carlota Raspberry, MD  atorvastatin (LIPITOR) 40 MG tablet Take 40 mg by mouth daily.    [provider]  budesonide-formoterol (SYMBICORT) 80-4.5 MCG/ACT inhaler Inhale 2 puffs into the lungs 2 (two) times daily. 12/11/18   [provider]  clopidogrel (PLAVIX) 75 MG tablet Take 1 tablet (75 mg total) by mouth daily. 09/27/19 09/26/20  Lyndee Hensen, DO  dapagliflozin propanediol (FARXIGA) 10 MG TABS tablet Take 1 tablet (10 mg total) by mouth daily before breakfast. 09/26/19   Angelia Mould, MD  DILT-XR 120 MG 24 hr capsule Take 120 mg by mouth daily. 06/03/19   [provider]  gabapentin (NEURONTIN) 800 MG tablet Take 800 mg by mouth 3 (three) times daily.    [provider]  glimepiride (AMARYL) 4 MG tablet Take 8 mg by mouth daily with breakfast.     [provider]  insulin glargine (LANTUS) 100 UNIT/ML injection Inject 0.4 mLs (40 Units total) into the skin daily. 09/27/19   Brimage, Ronnette Juniper, DO  insulin lispro (HUMALOG) 100 UNIT/ML injection Inject 14 Units into the skin 3 (three) times daily before meals.    [provider]  lipase/protease/amylase (CREON) 36000 UNITS CPEP capsule Take 2 capsule by mouth with each meal and 1 capsule by mouth with each snack 09/17/19   Armbruster, Carlota Raspberry, MD  losartan (COZAAR) 50 MG tablet Take  50 mg by mouth daily. 02/11/19   [provider]  MYRBETRIQ 50 MG TB24 tablet Take 50 mg by mouth daily. 06/03/19   [provider]  potassium chloride (K-DUR) 10 MEQ tablet Take 10 mEq by mouth daily as needed (For cramps).     [provider]  Toremifene Citrate (FARESTON) 60 MG tablet Take 1 tablet (60 mg total) by mouth daily. 09/27/19   Lyndee Hensen, DO  torsemide (DEMADEX) 20 MG tablet Take 2 tablets (40 mg total) by mouth daily. 09/27/19 10/27/19  Lyndee Hensen, DO    Inpatient Medications: Scheduled Meds: . amitriptyline  50 mg Oral QHS  . atorvastatin  40 mg Oral q1800  . clopidogrel  75 mg Oral Daily  . diltiazem  120 mg Oral Daily  . docusate sodium  100 mg Oral BID  . gabapentin  800 mg Oral TID  . insulin aspart  0-20 Units Subcutaneous TID WC  . insulin aspart  0-5 Units Subcutaneous QHS  .  insulin aspart  6 Units Subcutaneous TID WC  . insulin glargine  20 Units Subcutaneous QHS  . losartan  50 mg Oral Daily  . mirabegron ER  50 mg Oral Daily  . mometasone-formoterol  2 puff Inhalation BID  . pantoprazole  40 mg Oral Daily  . torsemide  40 mg Oral Daily   Continuous Infusions: . sodium chloride 50 mL/hr at 10/10/19 0642  . heparin 12 Units/kg/hr (10/10/19 0642)   PRN Meds: albuterol, alum & mag hydroxide-simeth, guaiFENesin-dextromethorphan, hydrALAZINE, HYDROmorphone (DILAUDID) injection, labetalol, metoprolol tartrate, ondansetron, oxyCODONE-acetaminophen, phenol, potassium chloride  Allergies:    Allergies  Allergen Reactions  . Nsaids Other (See Comments)    Pancreatitis  . Tolmetin Other (See Comments)    Pancreatitis  . Aspirin Other (See Comments)    "Makes my pancreas act up"   . Tramadol Swelling    Social History:   Social History   Socioeconomic History  . Marital status: Single    Spouse name: Not on file  . Number of children: Not on file  . Years of education: Not on file  . Highest education level: Not on  file  Occupational History  . Not on file  Tobacco Use  . Smoking status: Light Tobacco Smoker    Types: Cigarettes    Last attempt to quit: 06/04/2019    Years since quitting: 0.3  . Smokeless tobacco: Never Used  Vaping Use  . Vaping Use: Never used  Substance and Sexual Activity  . Alcohol use: Not Currently    Comment: Beer - "on the weekends hanging out, maybe 2 or 3"   . Drug use: Not Currently    Types: Marijuana    Comment: last used 8 2020  . Sexual activity: Not on file  Other Topics Concern  . Not on file  Social History Narrative  . Not on file   Social Determinants of Health   Financial Resource Strain:   . Difficulty of Paying Living Expenses:   Food Insecurity:   . Worried About Charity fundraiser in the Last Year:   . Arboriculturist in the Last Year:   Transportation Needs:   . Film/video editor (Medical):   Marland Kitchen Lack of Transportation (Non-Medical):   Physical Activity:   . Days of Exercise per Week:   . Minutes of Exercise per Session:   Stress:   . Feeling of Stress :   Social Connections:   . Frequency of Communication with Friends and Family:   . Frequency of Social Gatherings with Friends and Family:   . Attends Religious Services:   . Active Member of Clubs or Organizations:   . Attends Archivist Meetings:   Marland Kitchen Marital Status:   Intimate Partner Violence:   . Fear of Current or Ex-Partner:   . Emotionally Abused:   Marland Kitchen Physically Abused:   . Sexually Abused:     Family History:    Family History  Problem Relation Age of Onset  . Diabetes Other   . Heart disease Other   . Breast cancer Maternal Grandmother   . Breast cancer Paternal Grandmother   . Stroke Son   . Colon cancer Neg Hx   . Esophageal cancer Neg Hx   . Rectal cancer Neg Hx   . Stomach cancer Neg Hx      ROS:  Please see the history of present illness.  ROS  All other ROS reviewed and negative.     Physical  Exam/Data:   Vitals:   10/10/19 0049  10/10/19 0517 10/10/19 0801 10/10/19 1020  BP: 128/84 (!) 131/95  122/86  Pulse: (!) 109 98  98  Resp: 18 19  20   Temp: 98.2 F (36.8 C) (!) 97.5 F (36.4 C)  97.7 F (36.5 C)  TempSrc: Oral Oral  Oral  SpO2: 99% 100% 100% 98%    Intake/Output Summary (Last 24 hours) at 10/10/2019 1244 Last data filed at 10/10/2019 0809 Gross per 24 hour  Intake 1169.84 ml  Output --  Net 1169.84 ml   There were no vitals filed for this visit. There is no height or weight on file to calculate BMI.  General:  Sitting on the edge of her hospital bed rocking in discomfort HEENT: sclera anicteric  Neck: no JVD Vascular: No carotid bruits; distal pulses 2+ bilaterally Cardiac:  normal S1, S2; RRR; no murmurs, rubs, or gallops Lungs:  clear to auscultation bilaterally, no wheezing, rhonchi or rales, speaking in full sentences without SOB, though appears SOB with movement - ?pain Abd: NABS, soft, nontender, no hepatomegaly Ext: 1+ LE edema with several wounds in various stages of healing; cool LE  Musculoskeletal:  No deformities, BUE and BLE strength normal and equal Skin: warm and dry  Neuro:  CNs 2-12 intact, no focal abnormalities noted Psych:  Normal affect   EKG:  The EKG was personally reviewed and demonstrates: sinus tachycardia with rate 107 bpm, non-specific T wave abnormalities (seen on previous), no STE/D Telemetry:  Telemetry was personally reviewed and demonstrates:  Sinus rhythm  Relevant CV Studies: Echocardiogram 09/22/19: 1. Left ventricular ejection fraction, by estimation, is 50 to 55%. The  left ventricle has low normal function. The left ventricle has no regional  wall motion abnormalities. Left ventricular diastolic parameters are  consistent with Grade II diastolic  dysfunction (pseudonormalization).  2. Right ventricular systolic function is normal. The right ventricular  size is normal.  3. The mitral valve is normal in structure. Trivial mitral valve  regurgitation.  No evidence of mitral stenosis.  4. The aortic valve is normal in structure. Aortic valve regurgitation is  not visualized. No aortic stenosis is present.  Left heart catheterization 2016: CONCLUSIONS:     CORONARY STATUS: Mild non-obstructive ASCAD      LV FUNCTION:        Ejection Fraction:  55%        Wall Motion:     Normal       OTHER: LCP for low grade NSTEMI in setting of cocaine+ UDS. 50% mRCA,  minimal CAD in LCX, LAD. Normal LVEF at 55%. EBL <51mL. No specimens. 26F  right radial. TR band for hemostasis.     Laboratory Data:  Chemistry Recent Labs  Lab 10/09/19 1710 10/10/19 0709  NA 139 134*  K 3.5 4.2  CL 102 101  CO2 27 22  GLUCOSE 104* 207*  BUN 10 9  CREATININE 1.27* 1.25*  CALCIUM 8.9 8.5*  GFRNONAA 46* 47*  GFRAA 54* 55*  ANIONGAP 10 11    No results for input(s): PROT, ALBUMIN, AST, ALT, ALKPHOS, BILITOT in the last 168 hours. Hematology Recent Labs  Lab 10/09/19 1710 10/10/19 0709  WBC 8.8 9.0  RBC 4.90 5.01  HGB 13.3 13.3  HCT 41.9 43.2  MCV 85.5 86.2  MCH 27.1 26.5  MCHC 31.7 30.8  RDW 17.2* 17.3*  PLT 458* 464*   Cardiac EnzymesNo results for input(s): TROPONINI in the last 168 hours. No results for input(s):  TROPIPOC in the last 168 hours.  BNP Recent Labs  Lab 10/09/19 1715  BNP 488.5*    DDimer No results for input(s): DDIMER in the last 168 hours.  Radiology/Studies:  CT Angio Aortobifemoral W and/or Wo Contrast  Result Date: 10/09/2019 CLINICAL DATA:  Left foot redness and swelling.  10/10 pain. EXAM: CT ANGIOGRAPHY OF ABDOMINAL AORTA WITH ILIOFEMORAL RUNOFF TECHNIQUE: Multidetector CT imaging of the abdomen, pelvis and lower extremities was performed using the standard protocol during bolus administration of intravenous contrast. Multiplanar CT image reconstructions and MIPs were obtained to evaluate the vascular anatomy. CONTRAST:  167mL OMNIPAQUE IOHEXOL 350 MG/ML SOLN COMPARISON:  None.  FINDINGS: VASCULAR Aorta: There are mild atherosclerotic changes of the aorta without evidence for dissection or aneurysm. Celiac: Patent without evidence of aneurysm, dissection, vasculitis or significant stenosis. There is a replaced left hepatic artery. SMA: Patent without evidence of aneurysm, dissection, vasculitis or significant stenosis. Renals: Both renal arteries are patent without evidence of aneurysm, dissection, vasculitis, fibromuscular dysplasia or significant stenosis. IMA: Patent without evidence of aneurysm, dissection, vasculitis or significant stenosis. RIGHT Lower Extremity Inflow: There are atherosclerotic changes of the common iliac artery, internal iliac artery, and external iliac artery resulting in less than 50% stenosis. Outflow: There is some nonspecific fat stranding about the right common femoral artery. This is of unknown clinical significance. There is moderate stenosis at the origin of the right SFA. There is a high-grade stenosis in the mid right SFA (axial series 5, image 189). More distally, there is an additional high-grade stenosis of the right SFA (axial series 5, image 197). Additional tandem high-grade stenoses are noted in the distal SFA with extension into the proximal above knee popliteal artery. There is mild narrowing of the below knee popliteal artery. Runoff: There is a high-grade stenosis at the origin of the right anterior tibial artery. There is a high-grade stenosis involving the proximal right anterior tibial artery (axial series 5, image 305). There is likely moderate stenosis at the origin of both the right posterior tibial and peroneal arteries. There is essentially a 3 vessel runoff on the right to the level of the patient's ankle. LEFT Lower Extremity Inflow: There are atherosclerotic changes of the left common, internal, and external iliac arteries without evidence for significant stenosis. There is some fat stranding about the left common femoral artery.  Outflow: There is complete occlusion of the proximal SFA. The left profunda femoris artery is patent. There is an occluded stent within the mid and proximal left SFA. There is reconstitution distally of the left SFA. Mild-to-moderate stenosis is noted involving the above knee popliteal artery. There is moderate narrowing of the below knee popliteal artery. Runoff: There is moderate narrowing of the proximal left AT. There is complete occlusion of the proximal peroneal artery. There is essentially a 2 vessel runoff to the level of the ankle via the anterior tibial and posterior tibial arteries. Veins: The veins are suboptimally evaluated on this exam. Review of the MIP images confirms the above findings. NON-VASCULAR Lower chest: There are small bilateral pleural effusions.The heart size is enlarged. There is a partially visualized airspace opacity in the lingula. There is atelectasis at the lung bases. There is mild reflux of contrast into the IVC. Hepatobiliary: The liver is normal. Normal gallbladder.There is no biliary ductal dilation. Pancreas: Normal contours without ductal dilatation. No peripancreatic fluid collection. Spleen: Unremarkable. Adrenals/Urinary Tract: --Adrenal glands: Unremarkable. --Right kidney/ureter: No hydronephrosis or radiopaque kidney stones. --Left kidney/ureter: No hydronephrosis or radiopaque  kidney stones. --Urinary bladder: Unremarkable. Stomach/Bowel: --Stomach/Duodenum: No hiatal hernia or other gastric abnormality. Normal duodenal course and caliber. --Small bowel: There are few mildly dilated loops of small bowel scattered throughout the abdomen without evidence for high-grade small bowel obstruction --Colon: Unremarkable. --Appendix: Normal. Lymphatic: --No retroperitoneal lymphadenopathy. --No mesenteric lymphadenopathy. --No pelvic or inguinal lymphadenopathy. Reproductive: Status post hysterectomy. No adnexal mass. Other: No ascites or free air. The abdominal wall is normal.  Musculoskeletal. There are degenerative changes of both hips, left greater than right. There is no evidence for an acute displaced fracture. There is nonspecific bilateral lower extremity edema, left worse than right. There are degenerative changes of both knees. IMPRESSION: 1. Age-indeterminate complete occlusion of the proximal left SFA. There is complete occlusion of a stent within the proximal-mid left SFA, also age indeterminate. There is reconstitution of the distal left SFA. There is some fat stranding about the left common femoral artery and proximal SFA suggesting this occlusion may be acute. Other possibilities for fat stranding at this location include a left sided DVT. Further evaluation with ultrasound is recommended. 2. Two vessel runoff to the level of the left ankle via the anterior tibial and posterior tibial arteries. 3. Three vessel runoff to the level of the right ankle. 4. Tandem high-grade stenoses are noted in the right SFA as detailed above. 5. Mild to moderate stenosis involving both popliteal arteries. 6. There is nonspecific fat stranding about both lower extremities, left worse than right. 7. Cardiomegaly with small bilateral pleural effusions. There is a partially visualized opacity in the lingula which may represent an area of atelectasis or developing infiltrate. Aortic Atherosclerosis (ICD10-I70.0). Electronically Signed   By: Constance Holster M.D.   On: 10/09/2019 20:11   DG Chest Portable 1 View  Result Date: 10/09/2019 CLINICAL DATA:  Shortness of breath. EXAM: PORTABLE CHEST 1 VIEW COMPARISON:  Sep 22, 2019 FINDINGS: Mild, stable linear atelectasis is seen within the lateral aspect of the mid left lung. A tiny left pleural effusion is seen. No pneumothorax is identified. The cardiac silhouette is mildly enlarged and unchanged in size. Multilevel degenerative changes seen throughout the thoracic spine. IMPRESSION: 1. Mild, stable mid left lung linear atelectasis. 2. Tiny left  pleural effusion. Electronically Signed   By: Virgina Norfolk M.D.   On: 10/09/2019 17:23    Assessment and Plan:   1. Preoperative assessment: planning for left fem-pop for management of PAD. She has some chest pressure though unclear if this is worse with activity. Also with DOE and recent diastolic CHF exacerbation. Additionally she has DM type 2 on insulin. Her COPD and OSA history certainly add to her surgical risk as well.  - Her revised cardiac risk index score is at least 3 with a 15% risk of adverse cardiac events in the perioperative setting - Will check a NST to evaluate for ischemia  2. CAD: non-obstructive on last ischemic evaluation - LHC in 2016 with 50% RCA stenosis and minimal LAD/LCx stenosis. Some chest pressure - ?pulmonary vs cardiac etiology. Recent Echo with EF 50-55%, G2DD, no RWMA. Not on aspirin due to unclear allergy (pt reports "makes my pancreas act up") - Will check a NST as above.  - Continue plavix and statin  3. Chronic diastolic CHF: recent admission with CHF exacerbation. She has continued to have some orthopnea, SOB, and LE edema, though lungs are clear on exam. Suspect BBlocker has not been used in the past due to history of cocaine use.  - Favor giving  a dose of IV lasix 80mg  today, then plan to resume po torsemide tomorrow.  - Continue to monitor strict I&Os and daily weights.   4. HTN: BP has been stable - Continue diltiazem and losartan  5. HLD: No lipids on file; goal LDL <70 - Will check FLP in AM - Continue atorvastatin  6. DM type 2: A1C 11.7; goal <7. She is on Farxiga - Continue management per primary team  7. OSA: compliant with BiPAP - Continue BiPAP   For questions or updates, please contact [place] Please consult [email or web address] for contact info under Cardiology/STEMI.   Signed, Abigail Butts, PA-C  10/10/2019 12:44 PM [phone]  Patient seen and examined  I agree with findings as noted above by Teodoro Kil above    Pt is a 59 yo with hx of nonobstructive CAD (cath 2016 50% RCA stenosis), Diastolic dysfunction/diastolic CHF, DM, COPD, substance abuse (tob, EtOH, cocaine), OSA (on BiPAP) and PAD   REcent admit for diasttolic CHF (May 8841); Echo LVEF 50 to 55% with Gr II diastolic dysfunciton .  The pt was admitted now with LLE pain., wound.  Plan for L fem/pop bypass   Asked to see for preop risk stratificaiton  The pt is not very active due to leg pain.   She does get SOB with little acitvlty  Blames on COPD   She denies CP     On exam, Pt is morbidly obese Neck:  JVP is not elevated  Lungs are relatively clear  She is moving air Cardiac RRR  No S3  II/VI systolic murmur Abd is nontender Ext with Tr edema   Wounds on legs   Feet cool  EKG  ST  No acute ST changes    Impression:  Pt with signif PVOD; mild CAD in 2016   Activity level does not allow to give adequate risk stratification Would therefore recomm Lexiscan myovue to r/o signficant ischemia Would give lasix IV x 1 today   Follow response  Lipids ordered for AM   Will continue to follow  Dorris Carnes MD

## 2019-10-10 NOTE — Progress Notes (Signed)
  Progress Note    10/10/2019 7:39 AM * No surgery found *  Subjective: Pain is stable  Vitals:   10/10/19 0049 10/10/19 0517  BP: 128/84 (!) 131/95  Pulse: (!) 109 98  Resp: 18 19  Temp: 98.2 F (36.8 C) (!) 97.5 F (36.4 C)  SpO2: 99% 100%    Physical Exam: Awake alert oriented Nonlabored respirations Palpable comfortable pulses bilaterally Both feet are warm there is sloughing of the left foot which is stable  CBC    Component Value Date/Time   WBC 8.8 10/09/2019 1710   RBC 4.90 10/09/2019 1710   HGB 13.3 10/09/2019 1710   HCT 41.9 10/09/2019 1710   PLT 458 (H) 10/09/2019 1710   MCV 85.5 10/09/2019 1710   MCH 27.1 10/09/2019 1710   MCHC 31.7 10/09/2019 1710   RDW 17.2 (H) 10/09/2019 1710   LYMPHSABS 2.8 10/09/2019 1710   MONOABS 0.8 10/09/2019 1710   EOSABS 0.2 10/09/2019 1710   BASOSABS 0.0 10/09/2019 1710    BMET    Component Value Date/Time   NA 139 10/09/2019 1710   K 3.5 10/09/2019 1710   CL 102 10/09/2019 1710   CO2 27 10/09/2019 1710   GLUCOSE 104 (H) 10/09/2019 1710   BUN 10 10/09/2019 1710   CREATININE 1.27 (H) 10/09/2019 1710   CALCIUM 8.9 10/09/2019 1710   GFRNONAA 46 (L) 10/09/2019 1710   GFRAA 54 (L) 10/09/2019 1710    INR No results found for: INR   Intake/Output Summary (Last 24 hours) at 10/10/2019 0739 Last data filed at 10/10/2019 2505 Gross per 24 hour  Intake 929.84 ml  Output --  Net 929.84 ml     Assessment/plan:  59 y.o. female is s/p stenting of the left SFA.  Patient without a bypass.  Will get cardiology to evaluate preoperatively and likely plan for bypass early next week most likely Tuesday.  Vein mapping and ABIs ordered.   Khristen Cheyney C. Donzetta Matters, MD [facility] Office: [phone] Pager: [phone]  10/10/2019 7:39 AM

## 2019-10-11 LAB — CBC
HCT: 39.9 % (ref 36.0–46.0)
Hemoglobin: 12.6 g/dL (ref 12.0–15.0)
MCH: 27.3 pg (ref 26.0–34.0)
MCHC: 31.6 g/dL (ref 30.0–36.0)
MCV: 86.6 fL (ref 80.0–100.0)
Platelets: 439 10*3/uL — ABNORMAL HIGH (ref 150–400)
RBC: 4.61 MIL/uL (ref 3.87–5.11)
RDW: 17.3 % — ABNORMAL HIGH (ref 11.5–15.5)
WBC: 8.5 10*3/uL (ref 4.0–10.5)
nRBC: 0 % (ref 0.0–0.2)

## 2019-10-11 LAB — BASIC METABOLIC PANEL
Anion gap: 12 (ref 5–15)
BUN: 11 mg/dL (ref 6–20)
CO2: 24 mmol/L (ref 22–32)
Calcium: 8.1 mg/dL — ABNORMAL LOW (ref 8.9–10.3)
Chloride: 102 mmol/L (ref 98–111)
Creatinine, Ser: 1.25 mg/dL — ABNORMAL HIGH (ref 0.44–1.00)
GFR calc Af Amer: 55 mL/min — ABNORMAL LOW (ref >60)
GFR calc non Af Amer: 47 mL/min — ABNORMAL LOW (ref >60)
Glucose, Bld: 202 mg/dL — ABNORMAL HIGH (ref 70–99)
Potassium: 3.5 mmol/L (ref 3.5–5.1)
Sodium: 138 mmol/L (ref 135–145)

## 2019-10-11 LAB — LIPID PANEL
Cholesterol: 196 mg/dL (ref 0–200)
HDL: 51 mg/dL (ref >40)
LDL Cholesterol: 123 mg/dL — ABNORMAL HIGH (ref 0–99)
Total CHOL/HDL Ratio: 3.8 RATIO
Triglycerides: 112 mg/dL (ref <150)
VLDL: 22 mg/dL (ref 0–40)

## 2019-10-11 LAB — GLUCOSE, CAPILLARY
Glucose-Capillary: 230 mg/dL — ABNORMAL HIGH (ref 70–99)
Glucose-Capillary: 176 mg/dL — ABNORMAL HIGH (ref 70–99)
Glucose-Capillary: 187 mg/dL — ABNORMAL HIGH (ref 70–99)
Glucose-Capillary: 147 mg/dL — ABNORMAL HIGH (ref 70–99)
Glucose-Capillary: 236 mg/dL — ABNORMAL HIGH (ref 70–99)

## 2019-10-11 LAB — HEPARIN LEVEL (UNFRACTIONATED): Heparin Unfractionated: 0.43 IU/mL (ref 0.30–0.70)

## 2019-10-11 NOTE — Plan of Care (Signed)

## 2019-10-11 NOTE — Progress Notes (Signed)
[facility] [place]  Subjective  - pain in left foot, able to wiggle toes and sensation intact   Objective (!) 141/97 (!) 102 98.3 F (36.8 C) 16 98%  Intake/Output Summary (Last 24 hours) at 10/11/2019 1126 Last data filed at 10/11/2019 0300 Gross per 24 hour  Intake 1028.2 ml  Output 1100 ml  Net -71.8 ml    Monophasic signal left foot Skin sloughing left foot Motor and sensation intact left foot  Laboratory Lab Results: Recent Labs    10/10/19 0709 10/11/19 1101  WBC 9.0 8.5  HGB 13.3 12.6  HCT 43.2 39.9  PLT 464* 439*   BMET Recent Labs    10/09/19 1710 10/10/19 0709  NA 139 134*  K 3.5 4.2  CL 102 101  CO2 27 22  GLUCOSE 104* 207*  BUN 10 9  CREATININE 1.27* 1.25*  CALCIUM 8.9 8.5*    COAG No results found for: INR, PROTIME No results found for: PTT  Assessment/Planning:  59 year old female admitted with occluded left SFA stent with rest pain.  She had vein mapping and ABIs yesterday and plan for left lower extremity bypass on Tuesday with Dr. Donzetta Matters.  Appreciate cardiology evaluation plan for nuclear stress test.  Left foot remains motor and sensory intact with monophasic flow at the ankle - some skin sloughing as previously noted by Dr. Donzetta Matters.    Marty Heck 10/11/2019 11:26 AM --

## 2019-10-11 NOTE — Progress Notes (Signed)
ANTICOAGULATION CONSULT NOTE  Pharmacy Consult for Heparin Indication: acute on chronic lower extremity ischemia  Allergies  Allergen Reactions  . Nsaids Other (See Comments)    Pancreatitis  . Tolmetin Other (See Comments)    Pancreatitis  . Aspirin Other (See Comments)    "Makes my pancreas act up"   . Tramadol Swelling    Patient Measurements: Ht: 65 in Wt: 94.9 kg IBW: 57 kg Heparin Dosing Weight: 79 kg  Vital Signs: Temp: 98.3 F (36.8 C) (06/12 0611) BP: 141/97 (06/12 0611) Pulse Rate: 102 (06/12 0611)  Labs: Recent Labs    10/09/19 1710 10/09/19 1710 10/10/19 0709 10/10/19 0709 10/10/19 1208 10/10/19 2133 10/11/19 1101  HGB 13.3   < > 13.3  --   --   --  12.6  HCT 41.9  --  43.2  --   --   --  39.9  PLT 458*  --  464*  --   --   --  439*  HEPARINUNFRC  --   --  0.37   < > <0.10* 0.54 0.43  CREATININE 1.27*  --  1.25*  --   --   --  1.25*   < > = values in this interval not displayed.    CrCl cannot be calculated (Unknown ideal weight.).   Medical History: Past Medical History:  Diagnosis Date  . Alcohol dependence ([place])   . Anxiety   . Breast cancer ([place])   . CHF (congestive heart failure) ([place])   . Cigarette nicotine dependence   . Colon polyps   . COPD (chronic obstructive pulmonary disease) ([place])   . Diabetes mellitus without complication ([place])   . Diabetic neuropathy ([place])   . Elevated lipase   . Gout   . Hyperlipemia   . Hypertension   . Insomnia   . Lymphedema   . Marijuana use   . OSA treated with BiPAP   . PAD (peripheral artery disease) ([place])   . Sleep apnea    wears BIPAP  . Ulcer of foot (H[facility])    right    Assessment: 59 y.o. F presents with L foot pain. Pharmacy consulted for heparin for acute on chronic lower extremity ischemia. No AC PTA. CBC ok on admission.  Today, heparin level remains in therapeutic range at 0.43. Hgb, platelets, and renal function stable. No issues with the infusion or overt bleeding noted per  RN.  Goal of Therapy:  Heparin level 0.3-0.7 units/ml Monitor platelets by anticoagulation protocol: Yes  Plan:  Continue heparin infusion at 1250 units/hr Monitor CBC, daily heparin level  Continue to monitor for signs/symptoms of bleeding   Brendolyn Patty, PharmD PGY2 Pharmacy Resident Phone [phone]  10/11/2019   12:04 PM

## 2019-10-12 ENCOUNTER — Inpatient Hospital Stay (HOSPITAL_COMMUNITY): Payer: Medicare Other

## 2019-10-12 DIAGNOSIS — I2510 Atherosclerotic heart disease of native coronary artery without angina pectoris: Secondary | ICD-10-CM

## 2019-10-12 DIAGNOSIS — Z01810 Encounter for preprocedural cardiovascular examination: Secondary | ICD-10-CM

## 2019-10-12 LAB — BASIC METABOLIC PANEL
Anion gap: 12 (ref 5–15)
BUN: 15 mg/dL (ref 6–20)
CO2: 25 mmol/L (ref 22–32)
Calcium: 8.4 mg/dL — ABNORMAL LOW (ref 8.9–10.3)
Chloride: 101 mmol/L (ref 98–111)
Creatinine, Ser: 1.35 mg/dL — ABNORMAL HIGH (ref 0.44–1.00)
GFR calc Af Amer: 50 mL/min — ABNORMAL LOW (ref >60)
GFR calc non Af Amer: 43 mL/min — ABNORMAL LOW (ref >60)
Glucose, Bld: 170 mg/dL — ABNORMAL HIGH (ref 70–99)
Potassium: 3.9 mmol/L (ref 3.5–5.1)
Sodium: 138 mmol/L (ref 135–145)

## 2019-10-12 LAB — CBC
HCT: 42.3 % (ref 36.0–46.0)
Hemoglobin: 13 g/dL (ref 12.0–15.0)
MCH: 26.9 pg (ref 26.0–34.0)
MCHC: 30.7 g/dL (ref 30.0–36.0)
MCV: 87.4 fL (ref 80.0–100.0)
Platelets: 443 10*3/uL — ABNORMAL HIGH (ref 150–400)
RBC: 4.84 MIL/uL (ref 3.87–5.11)
RDW: 17.4 % — ABNORMAL HIGH (ref 11.5–15.5)
WBC: 7.9 10*3/uL (ref 4.0–10.5)
nRBC: 0 % (ref 0.0–0.2)

## 2019-10-12 LAB — NM MYOCAR MULTI W/SPECT W/WALL MOTION / EF
Estimated workload: 1 METS
Exercise duration (min): 0 min
Exercise duration (sec): 0 s
MPHR: 162 {beats}/min
Peak HR: 107 {beats}/min
Percent HR: 66 %
Rest HR: 96 {beats}/min

## 2019-10-12 LAB — GLUCOSE, CAPILLARY
Glucose-Capillary: 174 mg/dL — ABNORMAL HIGH (ref 70–99)
Glucose-Capillary: 196 mg/dL — ABNORMAL HIGH (ref 70–99)
Glucose-Capillary: 152 mg/dL — ABNORMAL HIGH (ref 70–99)
Glucose-Capillary: 193 mg/dL — ABNORMAL HIGH (ref 70–99)

## 2019-10-12 LAB — HEPARIN LEVEL (UNFRACTIONATED): Heparin Unfractionated: 0.56 IU/mL (ref 0.30–0.70)

## 2019-10-12 MED ORDER — TECHNETIUM TC 99M TETROFOSMIN IV KIT
10.7000 | PACK | Freq: Once | INTRAVENOUS | Status: AC | PRN
  Administered 2019-10-12: 10.7 via INTRAVENOUS

## 2019-10-12 MED ORDER — TECHNETIUM TC 99M TETROFOSMIN IV KIT
32.2000 | PACK | Freq: Once | INTRAVENOUS | Status: AC | PRN
  Administered 2019-10-12: 32.2 via INTRAVENOUS

## 2019-10-12 MED ORDER — REGADENOSON 0.4 MG/5ML IV SOLN
INTRAVENOUS | Status: AC
  Administered 2019-10-12: 0.4 mg via INTRAVENOUS
  Filled 2019-10-12: qty 5

## 2019-10-12 MED ORDER — HEPARIN (PORCINE) 25000 UT/250ML-% IV SOLN
1250.0000 [IU]/h | INTRAVENOUS | Status: DC
  Administered 2019-10-12 – 2019-10-13 (×2): 1250 [IU]/h via INTRAVENOUS
  Filled 2019-10-12 (×4): qty 250

## 2019-10-12 MED ORDER — REGADENOSON 0.4 MG/5ML IV SOLN
0.4000 mg | Freq: Once | INTRAVENOUS | Status: AC
  Filled 2019-10-12: qty 5

## 2019-10-12 NOTE — Progress Notes (Signed)
ANTICOAGULATION CONSULT NOTE  Pharmacy Consult for Heparin Indication: acute on chronic lower extremity ischemia  Allergies  Allergen Reactions  . Nsaids Other (See Comments)    Pancreatitis  . Tolmetin Other (See Comments)    Pancreatitis  . Aspirin Other (See Comments)    "Makes my pancreas act up"   . Tramadol Swelling    Patient Measurements: Ht: 65 in Wt: 94.9 kg IBW: 57 kg Heparin Dosing Weight: 79 kg  Vital Signs: Temp: 98.4 F (36.9 C) (06/13 0439) Temp Source: Oral (06/13 0439) BP: 125/90 (06/13 0439) Pulse Rate: 98 (06/13 0439)  Labs: Recent Labs    10/10/19 0709 10/10/19 0709 10/10/19 1208 10/10/19 2133 10/11/19 1101 10/12/19 0304  HGB 13.3   < >  --   --  12.6 13.0  HCT 43.2  --   --   --  39.9 42.3  PLT 464*  --   --   --  439* 443*  HEPARINUNFRC 0.37  --    < > 0.54 0.43 0.56  CREATININE 1.25*  --   --   --  1.25* 1.35*   < > = values in this interval not displayed.    Estimated Creatinine Clearance: 50.9 mL/min (A) (by C-G formula based on SCr of 1.35 mg/dL (H)).   Medical History: Past Medical History:  Diagnosis Date  . Alcohol dependence ([place])   . Anxiety   . Breast cancer ([place])   . CHF (congestive heart failure) ([place])   . Cigarette nicotine dependence   . Colon polyps   . COPD (chronic obstructive pulmonary disease) ([place])   . Diabetes mellitus without complication ([place])   . Diabetic neuropathy ([place])   . Elevated lipase   . Gout   . Hyperlipemia   . Hypertension   . Insomnia   . Lymphedema   . Marijuana use   . OSA treated with BiPAP   . PAD (peripheral artery disease) ([place])   . Sleep apnea    wears BIPAP  . Ulcer of foot (H[facility])    right    Assessment: 59 y.o. F presents with L foot pain. Pharmacy consulted for heparin for acute on chronic lower extremity ischemia. No AC PTA. CBC ok on admission.  Today, heparin level remains in therapeutic range at 0.56. Hgb, platelets, and renal function stable. No issues with the  infusion or overt bleeding noted per RN.  Goal of Therapy:  Heparin level 0.3-0.7 units/ml Monitor platelets by anticoagulation protocol: Yes  Plan:  Continue heparin infusion at 1250 units/hr Monitor CBC, daily heparin level  Continue to monitor for signs/symptoms of bleeding F/u plans for anticoagulant/antiplatelet after fem/pop bypass next week    Brendolyn Patty, PharmD PGY2 Pharmacy Resident Phone [phone]  10/12/2019   7:42 AM

## 2019-10-12 NOTE — Progress Notes (Signed)
59 year old female admitted with acute on chronic left leg ischemia with occluded SFA stent.  Plan is bypass on Tuesday pending cardiology clearance.  Patient was in nuclear medicine this morning for nuclear stress test and was not in her room.  Vein mapping and ABIs completed through the weekend.  ABIs show dampened monophasic flow at the ankle.  Remained motor and sensory intact on my last evaluation.  Appreciate cardiology evaluation.  Continue heparin gtt.  Marty Heck, MD [facility] [place] Office: [phone]

## 2019-10-12 NOTE — Progress Notes (Signed)
[facility]:  Myovue scan reviewed.  Difficult study due to patient's size and extensive breast shadowing over most of myocardium One portion of distal inferior wall not shadowed. Large fixed defect anterior/anteroseptal/anterolateral may represent scar and / or soft tissue attenuation (breast)  Previous LVEF in May 2021 echo was normal Patient without angina.  Cath in 2016 showed 50% stenosis RCA only. REcomm:  WOuld recomm limited repeat to reconfirm LVEF.in am    Morgan Carnes MD

## 2019-10-12 NOTE — Plan of Care (Signed)
  Problem: Activity: Goal: Risk for activity intolerance will decrease Outcome: Progressing   Problem: Nutrition: Goal: Adequate nutrition will be maintained Outcome: Progressing   Problem: Elimination: Goal: Will not experience complications related to bowel motility Outcome: Progressing Goal: Will not experience complications related to urinary retention Outcome: Progressing   

## 2019-10-12 NOTE — Progress Notes (Signed)
   Patient presented for a nuclear stress test today. No evidence of high grade AV block or wheezing on exam so proceeded with stress test as planned. No immediate complications. Stress imaging is pending at this time.   Preliminary EKG findings may be listed in the chart, but the stress test result will not be finalized until perfusion imaging is complete.  Darreld Mclean, PA-C 10/12/2019 11:00 AM

## 2019-10-13 ENCOUNTER — Inpatient Hospital Stay (HOSPITAL_COMMUNITY): Payer: Medicare Other

## 2019-10-13 DIAGNOSIS — E1151 Type 2 diabetes mellitus with diabetic peripheral angiopathy without gangrene: Secondary | ICD-10-CM

## 2019-10-13 DIAGNOSIS — I10 Essential (primary) hypertension: Secondary | ICD-10-CM

## 2019-10-13 DIAGNOSIS — E785 Hyperlipidemia, unspecified: Secondary | ICD-10-CM

## 2019-10-13 DIAGNOSIS — I2510 Atherosclerotic heart disease of native coronary artery without angina pectoris: Secondary | ICD-10-CM

## 2019-10-13 DIAGNOSIS — R9439 Abnormal result of other cardiovascular function study: Secondary | ICD-10-CM

## 2019-10-13 DIAGNOSIS — Z794 Long term (current) use of insulin: Secondary | ICD-10-CM

## 2019-10-13 LAB — GLUCOSE, CAPILLARY
Glucose-Capillary: 135 mg/dL — ABNORMAL HIGH (ref 70–99)
Glucose-Capillary: 197 mg/dL — ABNORMAL HIGH (ref 70–99)
Glucose-Capillary: 211 mg/dL — ABNORMAL HIGH (ref 70–99)
Glucose-Capillary: 112 mg/dL — ABNORMAL HIGH (ref 70–99)

## 2019-10-13 LAB — HEPARIN LEVEL (UNFRACTIONATED): Heparin Unfractionated: 0.34 IU/mL (ref 0.30–0.70)

## 2019-10-13 LAB — BASIC METABOLIC PANEL
Anion gap: 9 (ref 5–15)
BUN: 17 mg/dL (ref 6–20)
CO2: 27 mmol/L (ref 22–32)
Calcium: 8.6 mg/dL — ABNORMAL LOW (ref 8.9–10.3)
Chloride: 101 mmol/L (ref 98–111)
Creatinine, Ser: 1.43 mg/dL — ABNORMAL HIGH (ref 0.44–1.00)
GFR calc Af Amer: 47 mL/min — ABNORMAL LOW (ref >60)
GFR calc non Af Amer: 40 mL/min — ABNORMAL LOW (ref >60)
Glucose, Bld: 168 mg/dL — ABNORMAL HIGH (ref 70–99)
Potassium: 4 mmol/L (ref 3.5–5.1)
Sodium: 137 mmol/L (ref 135–145)

## 2019-10-13 LAB — CBC
HCT: 40.5 % (ref 36.0–46.0)
Hemoglobin: 12.5 g/dL (ref 12.0–15.0)
MCH: 26.7 pg (ref 26.0–34.0)
MCHC: 30.9 g/dL (ref 30.0–36.0)
MCV: 86.5 fL (ref 80.0–100.0)
Platelets: 448 10*3/uL — ABNORMAL HIGH (ref 150–400)
RBC: 4.68 MIL/uL (ref 3.87–5.11)
RDW: 17.2 % — ABNORMAL HIGH (ref 11.5–15.5)
WBC: 7.2 10*3/uL (ref 4.0–10.5)
nRBC: 0 % (ref 0.0–0.2)

## 2019-10-13 LAB — ECHOCARDIOGRAM LIMITED
Height: 65 in
Weight: 3245.17 [oz_av]

## 2019-10-13 LAB — SURGICAL PCR SCREEN
MRSA, PCR: NEGATIVE
Staphylococcus aureus: NEGATIVE

## 2019-10-13 MED ORDER — MUPIROCIN 2 % EX OINT: 1.0000 "application " | TOPICAL_OINTMENT | Freq: Two times a day (BID) | CUTANEOUS | Status: DC

## 2019-10-13 NOTE — Progress Notes (Signed)
ANTICOAGULATION CONSULT NOTE - Follow Up Consult  Pharmacy Consult for Heparin Indication: acute on chronic lower extremity ischemia  Allergies  Allergen Reactions  . Nsaids Other (See Comments)    Pancreatitis  . Tolmetin Other (See Comments)    Pancreatitis  . Aspirin Other (See Comments)    "Makes my pancreas act up"   . Tramadol Swelling    Patient Measurements: Height: 5\' 5"  (165.1 cm) Weight: 92 kg (202 lb 13.2 oz) IBW/kg (Calculated) : 57 Heparin Dosing Weight: 78 kg  Vital Signs: Temp: 98.1 F (36.7 C) (06/14 0421) Temp Source: Oral (06/14 0421) BP: 132/87 (06/14 0421) Pulse Rate: 95 (06/14 0421)  Labs: Recent Labs    10/11/19 1101 10/11/19 1101 10/12/19 0304 10/13/19 0914  HGB 12.6   < > 13.0 12.5  HCT 39.9  --  42.3 40.5  PLT 439*  --  443* 448*  HEPARINUNFRC 0.43  --  0.56 0.34  CREATININE 1.25*  --  1.35* 1.43*   < > = values in this interval not displayed.    Estimated Creatinine Clearance: 48.1 mL/min (A) (by C-G formula based on SCr of 1.43 mg/dL (H)).  Assessment:  59 yr old female presented 6/10 with L foot pain.  Pharmacy consulted for IV heparin for acute on chronic lower extremity ischemia. On Plavix as PTA, no ASA due to allergy.     Hx left SFA stent 09/25/17; occluded and planning OR on 6/15.    Heparin level remains therapeutic (0.34) on 1250 units/hr.  Goal of Therapy:  Heparin level 0.3-0.7 units/ml Monitor platelets by anticoagulation protocol: Yes   Plan:   Continue heparin drip at 1250 units/hr.  Daily heparin level and CBC.  Follow up post-op plans.   Arty Baumgartner, [place] Phone: [phone] 10/13/2019,11:45 AM

## 2019-10-13 NOTE — Progress Notes (Signed)
Progress Note  Patient Name: Morgan Beasley Date of Encounter: 10/13/2019  [facility] Cardiologist: Dorris Carnes, MD   Subjective   Continues to have left lower extremity pain. We discussed the results of her stress test (see below). Planned for limited echo today to evaluate EF. She denies any chest pain, breathing is stable.  Inpatient Medications    Scheduled Meds: . amitriptyline  50 mg Oral QHS  . atorvastatin  40 mg Oral q1800  . clopidogrel  75 mg Oral Daily  . diltiazem  120 mg Oral Daily  . docusate sodium  100 mg Oral BID  . gabapentin  800 mg Oral TID  . insulin aspart  0-20 Units Subcutaneous TID WC  . insulin aspart  0-5 Units Subcutaneous QHS  . insulin aspart  6 Units Subcutaneous TID WC  . insulin glargine  20 Units Subcutaneous QHS  . losartan  50 mg Oral Daily  . mirabegron ER  50 mg Oral Daily  . mometasone-formoterol  2 puff Inhalation BID  . pantoprazole  40 mg Oral Daily  . torsemide  40 mg Oral Daily   Continuous Infusions: . sodium chloride Stopped (10/10/19 1541)  . heparin 1,250 Units/hr (10/12/19 1605)   PRN Meds: albuterol, alum & mag hydroxide-simeth, guaiFENesin-dextromethorphan, hydrALAZINE, HYDROmorphone (DILAUDID) injection, labetalol, metoprolol tartrate, ondansetron, oxyCODONE-acetaminophen, phenol, potassium chloride   Vital Signs    Vitals:   10/12/19 1049 10/12/19 1052 10/12/19 2024 10/13/19 0421  BP: (!) 123/91 (!) 122/94 123/85 132/87  Pulse:   92 95  Resp:   18 18  Temp:   99 F (37.2 C) 98.1 F (36.7 C)  TempSrc:   Oral Oral  SpO2:   94% 100%  Weight:        Intake/Output Summary (Last 24 hours) at 10/13/2019 1132 Last data filed at 10/13/2019 0840 Gross per 24 hour  Intake 343.08 ml  Output 400 ml  Net -56.92 ml   Last 3 Weights 10/12/2019 10/11/2019 09/27/2019  Weight (lbs) 202 lb 13.2 oz 209 lb 209 lb 3.2 oz  Weight (kg) 92 kg 94.802 kg 94.892 kg      Telemetry    NSR - Personally Reviewed  ECG     10/09/19  Sinus tach at 107 bpm- Personally Reviewed  Physical Exam   GEN: No acute distress.   Neck: No JVD Cardiac: RRR, no murmurs, rubs, or gallops.  Respiratory: Clear to auscultation bilaterally. GI: Soft, nontender, non-distended  MS: No edema; Feet warm to touch, healing left foot lesion Neuro:  Nonfocal  Psych: Normal affect   Labs    High Sensitivity Troponin:  No results for input(s): TROPONINIHS in the last 720 hours.    Chemistry Recent Labs  Lab 10/11/19 1101 10/12/19 0304 10/13/19 0914  NA 138 138 137  K 3.5 3.9 4.0  CL 102 101 101  CO2 24 25 27   GLUCOSE 202* 170* 168*  BUN 11 15 17   CREATININE 1.25* 1.35* 1.43*  CALCIUM 8.1* 8.4* 8.6*  GFRNONAA 47* 43* 40*  GFRAA 55* 50* 47*  ANIONGAP 12 12 9      Hematology Recent Labs  Lab 10/11/19 1101 10/12/19 0304 10/13/19 0914  WBC 8.5 7.9 7.2  RBC 4.61 4.84 4.68  HGB 12.6 13.0 12.5  HCT 39.9 42.3 40.5  MCV 86.6 87.4 86.5  MCH 27.3 26.9 26.7  MCHC 31.6 30.7 30.9  RDW 17.3* 17.4* 17.2*  PLT 439* 443* 448*    BNP Recent Labs  Lab 10/09/19 1715  BNP 488.5*     DDimer No results for input(s): DDIMER in the last 168 hours.   Radiology    NM Myocar Multi W/Spect W/Wall Motion / EF  Result Date: 10/12/2019  There was no ST segment deviation noted during stress.  Perfusion scan with evidence of large infarct and/or extensive soft tissue attenuation (extensive breast). No ischemia LVEF 31% Review of the raw images shows breast shadow overall but small part of myocardium  LVEF 31%  High risk scan due to possible scar and depressed LVEF  REcomm limited echo to reconfirm LVEF     Cardiac Studies   Lexiscan myoview 10/12/19  There was no ST segment deviation noted during stress.  Perfusion scan with evidence of large infarct and/or extensive soft tissue attenuation (extensive breast). No ischemia LVEF 31% Review of the raw images shows breast shadow overall but small part of myocardium  LVEF 31%   High risk scan due to possible scar and depressed LVEF  REcomm limited echo to reconfirm LVEF  Echocardiogram 09/22/19: 1. Left ventricular ejection fraction, by estimation, is 50 to 55%. The  left ventricle has low normal function. The left ventricle has no regional  wall motion abnormalities. Left ventricular diastolic parameters are  consistent with Grade II diastolic  dysfunction (pseudonormalization).  2. Right ventricular systolic function is normal. The right ventricular  size is normal.  3. The mitral valve is normal in structure. Trivial mitral valve  regurgitation. No evidence of mitral stenosis.  4. The aortic valve is normal in structure. Aortic valve regurgitation is  not visualized. No aortic stenosis is present.  Left heart catheterization 2016: CONCLUSIONS:     CORONARY STATUS: Mild non-obstructive ASCAD      LV FUNCTION:        Ejection Fraction:  55%        Wall Motion:     Normal       OTHER: LCP for low grade NSTEMI in setting of cocaine+ UDS. 50% mRCA,  minimal CAD in LCX, LAD. Normal LVEF at 55%. EBL <36mL. No specimens. 25F  right radial. TR band for hemostasis.    Patient Profile     59 y.o. female with a PMH of non-obstructive CAD, chronic diastolic CHF, HTN, HLD, PAD s/p L SFA stenting 09/26/19, DM type 2, Gout, COPD, tobacco and ETOH abuse, breast cancer s/p right mastectomy, and OSA on BiPAP,  who is being seen for preoperative assessment at the request of Dr. Donzetta Matters.  Assessment & Plan    Preoperative cardiovascular risk assessment: -underwent lexiscan myoview yesterday. Challenging study. There is a fixed defect, large, across anterior/anteroseptal/anterolateral regions. May be scar vs. Artifact. EF reported as reduced on nuclear study, but previously normal EF last month. Nuclear stress not ideal for EF assessment, ordered for limited echo today. -asymptomatic from cardiac perspective -she has significant risk  factors, but she is asymptomatic. If EF normal on echo, recommend pursuing surgery tomorrow -If EF down on echo, she can still proceed to surgery, but will need to be cautious with fluid resuscitation, etc.  Prior nonobstructive CAD, hypertension, hyperlipidemia, type II diabetes, chronic diastolic heart failure, tobacco use: -stress test, as above -cath 2016 with minimal RCA disease (not consistent with distribution seen on stress test) -on clopidogrel for antiplatelet, aspirin listed as allergy -on atorvastatin 40 mg daily -recommend tobacco cessation/abstinence -on dapagliflozin, appropriate given diabetes and CAD -on diltiazem (would stop if EF down), losartan, torsemide for hypertension/diastolic heart failure, continue  PAD: pending surgical revascularization -  per [facility]  For questions or updates, please contact [place] Please consult [email or web address] for contact info under     Signed, Buford Dresser, MD  10/13/2019, 11:32 AM

## 2019-10-13 NOTE — Plan of Care (Signed)
  Problem: Education: Goal: Knowledge of General Education information will improve Description Including pain rating scale, medication(s)/side effects and non-pharmacologic comfort measures Outcome: Progressing   

## 2019-10-13 NOTE — Progress Notes (Signed)
  Echocardiogram 2D Echocardiogram has been performed.  Morgan Beasley 10/13/2019, 3:27 PM

## 2019-10-13 NOTE — Progress Notes (Addendum)
Progress Note    10/13/2019 7:44 AM * No surgery date entered *  Subjective: Complaining of left lower extremity pain.  Her attendant RN this morning indicates the patient is desired for DNR order.  I discussed this with the patient and she indicates to me that she would want resuscitative efforts but does not desire intubation/mechanical ventilation. Cardiology notes reviewed.   Vitals:   10/12/19 2024 10/13/19 0421  BP: 123/85 132/87  Pulse: 92 95  Resp: 18 18  Temp: 99 F (37.2 C) 98.1 F (36.7 C)  SpO2: 94% 100%    Physical Exam: Cardiac: Rhythm and rate are regular Lungs: Clear to auscultation bilaterally Extremities: Both feet are warm with active range of motion and sensation intact.  Bullous lesion of left foot is now dry with desquamation Abdomen: Soft, nondistended  CBC    Component Value Date/Time   WBC 7.9 10/12/2019 0304   RBC 4.84 10/12/2019 0304   HGB 13.0 10/12/2019 0304   HCT 42.3 10/12/2019 0304   PLT 443 (H) 10/12/2019 0304   MCV 87.4 10/12/2019 0304   MCH 26.9 10/12/2019 0304   MCHC 30.7 10/12/2019 0304   RDW 17.4 (H) 10/12/2019 0304   LYMPHSABS 2.8 10/09/2019 1710   MONOABS 0.8 10/09/2019 1710   EOSABS 0.2 10/09/2019 1710   BASOSABS 0.0 10/09/2019 1710    BMET    Component Value Date/Time   NA 138 10/12/2019 0304   K 3.9 10/12/2019 0304   CL 101 10/12/2019 0304   CO2 25 10/12/2019 0304   GLUCOSE 170 (H) 10/12/2019 0304   BUN 15 10/12/2019 0304   CREATININE 1.35 (H) 10/12/2019 0304   CALCIUM 8.4 (L) 10/12/2019 0304   GFRNONAA 43 (L) 10/12/2019 0304   GFRAA 50 (L) 10/12/2019 0304     Intake/Output Summary (Last 24 hours) at 10/13/2019 0744 Last data filed at 10/12/2019 1700 Gross per 24 hour  Intake 103.08 ml  Output --  Net 103.08 ml    HOSPITAL MEDICATIONS Scheduled Meds: . amitriptyline  50 mg Oral QHS  . atorvastatin  40 mg Oral q1800  . clopidogrel  75 mg Oral Daily  . diltiazem  120 mg Oral Daily  . docusate sodium   100 mg Oral BID  . gabapentin  800 mg Oral TID  . insulin aspart  0-20 Units Subcutaneous TID WC  . insulin aspart  0-5 Units Subcutaneous QHS  . insulin aspart  6 Units Subcutaneous TID WC  . insulin glargine  20 Units Subcutaneous QHS  . losartan  50 mg Oral Daily  . mirabegron ER  50 mg Oral Daily  . mometasone-formoterol  2 puff Inhalation BID  . pantoprazole  40 mg Oral Daily  . torsemide  40 mg Oral Daily   Continuous Infusions: . sodium chloride Stopped (10/10/19 1541)  . heparin 1,250 Units/hr (10/12/19 1605)   PRN Meds:.albuterol, alum & mag hydroxide-simeth, guaiFENesin-dextromethorphan, hydrALAZINE, HYDROmorphone (DILAUDID) injection, labetalol, metoprolol tartrate, ondansetron, oxyCODONE-acetaminophen, phenol, potassium chloride  Assessment: That is post left SFA stent, now occluded.  Left lower extremity rest pain.  Remains on heparin infusion.  Hemoglobin is stable.  Plan: -Cardiology evaluate underway.  Vein mapping and ABIs completed.  Tentatively scheduled for left common femoral to popliteal artery bypass, vein versus graft. She indicates desire for DNI. -DVT prophylaxis: Heparin infusion   Risa Grill, PA-C [facility] [phone] 10/13/2019  7:44 AM   I have independently interviewed and examined patient and agree with PA assessment and plan above.  Has sloughing of the left foot secondary to previous blistering.  Continue heparin today we will plan for left common femoral to popliteal artery bypass pending cardiology clearance tomorrow in the OR.  She will be n.p.o. past midnight.  Will not need heparin post procedure.  Nil Xiong C. Donzetta Matters, MD [facility] [place] Office: [phone] Pager: [phone]

## 2019-10-14 ENCOUNTER — Encounter (HOSPITAL_COMMUNITY): Payer: Self-pay | Admitting: Vascular Surgery

## 2019-10-14 ENCOUNTER — Inpatient Hospital Stay (HOSPITAL_COMMUNITY): Payer: Medicare Other | Admitting: Certified Registered Nurse Anesthetist

## 2019-10-14 ENCOUNTER — Other Ambulatory Visit: Payer: Self-pay | Admitting: *Deleted

## 2019-10-14 ENCOUNTER — Encounter (HOSPITAL_COMMUNITY)
Admission: AD | Disposition: A | Discharge status: Home Health | Payer: Self-pay | Source: Ambulatory Visit | Attending: Vascular Surgery

## 2019-10-14 DIAGNOSIS — I429 Cardiomyopathy, unspecified: Secondary | ICD-10-CM

## 2019-10-14 DIAGNOSIS — I739 Peripheral vascular disease, unspecified: Secondary | ICD-10-CM

## 2019-10-14 HISTORY — PX: FEMORAL-POPLITEAL BYPASS GRAFT: SHX937

## 2019-10-14 LAB — CULTURE, BLOOD (ROUTINE X 2)
Culture: NO GROWTH
Culture: NO GROWTH
Special Requests: ADEQUATE
Special Requests: ADEQUATE

## 2019-10-14 LAB — CBC
HCT: 38.1 % (ref 36.0–46.0)
Hemoglobin: 11.7 g/dL — ABNORMAL LOW (ref 12.0–15.0)
MCH: 27.1 pg (ref 26.0–34.0)
MCHC: 30.7 g/dL (ref 30.0–36.0)
MCV: 88.2 fL (ref 80.0–100.0)
Platelets: 396 10*3/uL (ref 150–400)
RBC: 4.32 MIL/uL (ref 3.87–5.11)
RDW: 17.2 % — ABNORMAL HIGH (ref 11.5–15.5)
WBC: 14.5 10*3/uL — ABNORMAL HIGH (ref 4.0–10.5)
nRBC: 0 % (ref 0.0–0.2)

## 2019-10-14 LAB — GLUCOSE, CAPILLARY
Glucose-Capillary: 114 mg/dL — ABNORMAL HIGH (ref 70–99)
Glucose-Capillary: 123 mg/dL — ABNORMAL HIGH (ref 70–99)
Glucose-Capillary: 124 mg/dL — ABNORMAL HIGH (ref 70–99)
Glucose-Capillary: 171 mg/dL — ABNORMAL HIGH (ref 70–99)
Glucose-Capillary: 166 mg/dL — ABNORMAL HIGH (ref 70–99)

## 2019-10-14 LAB — BASIC METABOLIC PANEL
Anion gap: 10 (ref 5–15)
BUN: 16 mg/dL (ref 6–20)
CO2: 24 mmol/L (ref 22–32)
Calcium: 8.6 mg/dL — ABNORMAL LOW (ref 8.9–10.3)
Chloride: 105 mmol/L (ref 98–111)
Creatinine, Ser: 1.29 mg/dL — ABNORMAL HIGH (ref 0.44–1.00)
GFR calc Af Amer: 53 mL/min — ABNORMAL LOW (ref >60)
GFR calc non Af Amer: 46 mL/min — ABNORMAL LOW (ref >60)
Glucose, Bld: 199 mg/dL — ABNORMAL HIGH (ref 70–99)
Potassium: 5 mmol/L (ref 3.5–5.1)
Sodium: 139 mmol/L (ref 135–145)

## 2019-10-14 LAB — HEPARIN LEVEL (UNFRACTIONATED): Heparin Unfractionated: 0.36 IU/mL (ref 0.30–0.70)

## 2019-10-14 LAB — ABO/RH: ABO/RH(D): B POS

## 2019-10-14 LAB — PREPARE RBC (CROSSMATCH)

## 2019-10-14 SURGERY — BYPASS GRAFT FEMORAL-POPLITEAL ARTERY
Anesthesia: General | Site: Leg Upper | Laterality: Left

## 2019-10-14 MED ORDER — SODIUM CHLORIDE 0.9 % IV SOLN
INTRAVENOUS | Status: AC
  Filled 2019-10-14: qty 1.2

## 2019-10-14 MED ORDER — MEPERIDINE HCL 25 MG/ML IJ SOLN: 6.2500 mg | INTRAMUSCULAR | Status: DC | PRN

## 2019-10-14 MED ORDER — CHLORHEXIDINE GLUCONATE CLOTH 2 % EX PADS
6.0000 | MEDICATED_PAD | Freq: Every day | CUTANEOUS | Status: DC
  Administered 2019-10-14 – 2019-10-19 (×3): 6 via TOPICAL

## 2019-10-14 MED ORDER — LIDOCAINE 2% (20 MG/ML) 5 ML SYRINGE
INTRAMUSCULAR | Status: DC | PRN
  Administered 2019-10-14: 100 mg via INTRAVENOUS

## 2019-10-14 MED ORDER — SODIUM CHLORIDE 0.9 % IV SOLN
INTRAVENOUS | Status: DC | PRN
  Administered 2019-10-14: 500 mL

## 2019-10-14 MED ORDER — SUGAMMADEX SODIUM 200 MG/2ML IV SOLN
INTRAVENOUS | Status: DC | PRN
  Administered 2019-10-14: 200 mg via INTRAVENOUS

## 2019-10-14 MED ORDER — PHENYLEPHRINE HCL-NACL 10-0.9 MG/250ML-% IV SOLN
INTRAVENOUS | Status: DC | PRN
  Administered 2019-10-14: 75 ug/min via INTRAVENOUS
  Administered 2019-10-14: 50 ug/min via INTRAVENOUS
  Administered 2019-10-14: 25 ug/min via INTRAVENOUS

## 2019-10-14 MED ORDER — HEPARIN SODIUM (PORCINE) 1000 UNIT/ML IJ SOLN
INTRAMUSCULAR | Status: AC
  Filled 2019-10-14: qty 1

## 2019-10-14 MED ORDER — MAGNESIUM SULFATE 2 GM/50ML IV SOLN: 2.0000 g | Freq: Every day | INTRAVENOUS | Status: DC | PRN

## 2019-10-14 MED ORDER — CLOPIDOGREL BISULFATE 75 MG PO TABS: 75.0000 mg | ORAL_TABLET | Freq: Every day | ORAL | Status: DC

## 2019-10-14 MED ORDER — CHLORHEXIDINE GLUCONATE 0.12 % MT SOLN: 15.0000 mL | Freq: Once | OROMUCOSAL | Status: AC

## 2019-10-14 MED ORDER — ALBUMIN HUMAN 5 % IV SOLN
INTRAVENOUS | Status: DC | PRN
Start: 2019-10-14 — End: 2019-10-14

## 2019-10-14 MED ORDER — HYDROMORPHONE HCL 1 MG/ML IJ SOLN
INTRAMUSCULAR | Status: AC
  Filled 2019-10-14: qty 1

## 2019-10-14 MED ORDER — ROCURONIUM BROMIDE 10 MG/ML (PF) SYRINGE
PREFILLED_SYRINGE | INTRAVENOUS | Status: AC
  Filled 2019-10-14: qty 10

## 2019-10-14 MED ORDER — HYDROMORPHONE HCL 1 MG/ML IJ SOLN
0.2500 mg | INTRAMUSCULAR | Status: DC | PRN
  Administered 2019-10-14 (×2): 0.25 mg via INTRAVENOUS
  Administered 2019-10-14: 0.5 mg via INTRAVENOUS

## 2019-10-14 MED ORDER — CEFAZOLIN SODIUM 1 G IJ SOLR
INTRAMUSCULAR | Status: AC
  Filled 2019-10-14: qty 20

## 2019-10-14 MED ORDER — LACTATED RINGERS IV SOLN: INTRAVENOUS | Status: DC

## 2019-10-14 MED ORDER — CHLORHEXIDINE GLUCONATE 0.12 % MT SOLN
OROMUCOSAL | Status: AC
  Administered 2019-10-14: 15 mL via OROMUCOSAL
  Filled 2019-10-14: qty 15

## 2019-10-14 MED ORDER — CEFAZOLIN SODIUM-DEXTROSE 2-3 GM-%(50ML) IV SOLR
INTRAVENOUS | Status: DC | PRN
  Administered 2019-10-14: 2 g via INTRAVENOUS

## 2019-10-14 MED ORDER — 0.9 % SODIUM CHLORIDE (POUR BTL) OPTIME
TOPICAL | Status: DC | PRN
  Administered 2019-10-14: 2000 mL

## 2019-10-14 MED ORDER — HEMOSTATIC AGENTS (NO CHARGE) OPTIME
TOPICAL | Status: DC | PRN
  Administered 2019-10-14: 1 via TOPICAL

## 2019-10-14 MED ORDER — FENTANYL CITRATE (PF) 250 MCG/5ML IJ SOLN
INTRAMUSCULAR | Status: DC | PRN
  Administered 2019-10-14 (×2): 25 ug via INTRAVENOUS
  Administered 2019-10-14: 150 ug via INTRAVENOUS
  Administered 2019-10-14 (×2): 25 ug via INTRAVENOUS

## 2019-10-14 MED ORDER — CEFAZOLIN SODIUM-DEXTROSE 2-4 GM/100ML-% IV SOLN
2.0000 g | Freq: Three times a day (TID) | INTRAVENOUS | Status: AC
  Administered 2019-10-14 – 2019-10-15 (×2): 2 g via INTRAVENOUS
  Filled 2019-10-14 (×2): qty 100

## 2019-10-14 MED ORDER — ONDANSETRON HCL 4 MG/2ML IJ SOLN
INTRAMUSCULAR | Status: DC | PRN
  Administered 2019-10-14: 4 mg via INTRAVENOUS

## 2019-10-14 MED ORDER — PROPOFOL 10 MG/ML IV BOLUS
INTRAVENOUS | Status: DC | PRN
  Administered 2019-10-14: 100 mg via INTRAVENOUS

## 2019-10-14 MED ORDER — HEPARIN SODIUM (PORCINE) 1000 UNIT/ML IJ SOLN
INTRAMUSCULAR | Status: DC | PRN
  Administered 2019-10-14: 10000 [IU] via INTRAVENOUS

## 2019-10-14 MED ORDER — POTASSIUM CHLORIDE CRYS ER 20 MEQ PO TBCR: 20.0000 meq | EXTENDED_RELEASE_TABLET | Freq: Every day | ORAL | Status: DC | PRN

## 2019-10-14 MED ORDER — MIDAZOLAM HCL 2 MG/2ML IJ SOLN
INTRAMUSCULAR | Status: AC
  Filled 2019-10-14: qty 2

## 2019-10-14 MED ORDER — MIDAZOLAM HCL 2 MG/2ML IJ SOLN
INTRAMUSCULAR | Status: DC | PRN
  Administered 2019-10-14: 2 mg via INTRAVENOUS

## 2019-10-14 MED ORDER — PROTAMINE SULFATE 10 MG/ML IV SOLN
INTRAVENOUS | Status: DC | PRN
Start: 2019-10-14 — End: 2019-10-14
  Administered 2019-10-14: 50 mg via INTRAVENOUS

## 2019-10-14 MED ORDER — HYDRALAZINE HCL 20 MG/ML IJ SOLN: 5.0000 mg | INTRAMUSCULAR | Status: DC | PRN

## 2019-10-14 MED ORDER — ONDANSETRON HCL 4 MG/2ML IJ SOLN: 4.0000 mg | Freq: Once | INTRAMUSCULAR | Status: DC | PRN

## 2019-10-14 MED ORDER — PROTAMINE SULFATE 10 MG/ML IV SOLN
INTRAVENOUS | Status: AC
  Filled 2019-10-14: qty 5

## 2019-10-14 MED ORDER — ROCURONIUM BROMIDE 10 MG/ML (PF) SYRINGE
PREFILLED_SYRINGE | INTRAVENOUS | Status: DC | PRN
  Administered 2019-10-14: 100 mg via INTRAVENOUS

## 2019-10-14 MED ORDER — LIDOCAINE 2% (20 MG/ML) 5 ML SYRINGE
INTRAMUSCULAR | Status: AC
  Filled 2019-10-14: qty 5

## 2019-10-14 MED ORDER — ONDANSETRON HCL 4 MG/2ML IJ SOLN
INTRAMUSCULAR | Status: AC
  Filled 2019-10-14: qty 2

## 2019-10-14 MED ORDER — SODIUM CHLORIDE 0.9 % IV SOLN: 500.0000 mL | Freq: Once | INTRAVENOUS | Status: DC | PRN

## 2019-10-14 MED ORDER — FENTANYL CITRATE (PF) 250 MCG/5ML IJ SOLN
INTRAMUSCULAR | Status: AC
  Filled 2019-10-14: qty 5

## 2019-10-14 SURGICAL SUPPLY — 62 items
ADH SKN CLS APL DERMABOND .7 (GAUZE/BANDAGES/DRESSINGS) ×4
BANDAGE ESMARK 6X9 LF (GAUZE/BANDAGES/DRESSINGS) IMPLANT
BNDG ESMARK 6X9 LF (GAUZE/BANDAGES/DRESSINGS)
CANISTER SUCT 3000ML PPV (MISCELLANEOUS) ×2 IMPLANT
CANNULA VESSEL 3MM 2 BLNT TIP (CANNULA) ×2 IMPLANT
CLIP VESOCCLUDE MED 24/CT (CLIP) ×2 IMPLANT
CLIP VESOCCLUDE SM WIDE 24/CT (CLIP) ×4 IMPLANT
CLIP VESOCCLUDE SM WIDE 6/CT (CLIP) ×2 IMPLANT
COVER WAND RF STERILE (DRAPES) IMPLANT
CUFF TOURN SGL QUICK 24 (TOURNIQUET CUFF)
CUFF TOURN SGL QUICK 34 (TOURNIQUET CUFF)
CUFF TOURN SGL QUICK 42 (TOURNIQUET CUFF) IMPLANT
CUFF TRNQT CYL 24X4X16.5-23 (TOURNIQUET CUFF) IMPLANT
CUFF TRNQT CYL 34X4.125X (TOURNIQUET CUFF) IMPLANT
DERMABOND ADVANCED (GAUZE/BANDAGES/DRESSINGS) ×4
DERMABOND ADVANCED .7 DNX12 (GAUZE/BANDAGES/DRESSINGS) ×4 IMPLANT
DRAIN CHANNEL 15F RND FF W/TCR (WOUND CARE) IMPLANT
DRAPE C-ARM 42X72 X-RAY (DRAPES) IMPLANT
DRAPE HALF SHEET 40X57 (DRAPES) ×2 IMPLANT
DRAPE X-RAY CASS 24X20 (DRAPES) IMPLANT
ELECT REM PT RETURN 9FT ADLT (ELECTROSURGICAL) ×2
ELECTRODE REM PT RTRN 9FT ADLT (ELECTROSURGICAL) ×1 IMPLANT
EVACUATOR SILICONE 100CC (DRAIN) IMPLANT
GLOVE BIO SURGEON STRL SZ7 (GLOVE) ×2 IMPLANT
GLOVE BIO SURGEON STRL SZ7.5 (GLOVE) ×4 IMPLANT
GLOVE BIOGEL PI IND STRL 6.5 (GLOVE) ×3 IMPLANT
GLOVE BIOGEL PI IND STRL 7.0 (GLOVE) ×1 IMPLANT
GLOVE BIOGEL PI IND STRL 8 (GLOVE) ×1 IMPLANT
GLOVE BIOGEL PI INDICATOR 6.5 (GLOVE) ×3
GLOVE BIOGEL PI INDICATOR 7.0 (GLOVE) ×1
GLOVE BIOGEL PI INDICATOR 8 (GLOVE) ×1
GOWN STRL REUS W/ TWL LRG LVL3 (GOWN DISPOSABLE) ×5 IMPLANT
GOWN STRL REUS W/ TWL XL LVL3 (GOWN DISPOSABLE) ×1 IMPLANT
GOWN STRL REUS W/TWL LRG LVL3 (GOWN DISPOSABLE) ×10
GOWN STRL REUS W/TWL XL LVL3 (GOWN DISPOSABLE) ×2
HEMOSTAT SNOW SURGICEL 2X4 (HEMOSTASIS) ×2 IMPLANT
INSERT FOGARTY SM (MISCELLANEOUS) IMPLANT
KIT BASIN OR (CUSTOM PROCEDURE TRAY) ×2 IMPLANT
KIT TURNOVER KIT B (KITS) ×2 IMPLANT
MARKER GRAFT CORONARY BYPASS (MISCELLANEOUS) IMPLANT
NS IRRIG 1000ML POUR BTL (IV SOLUTION) ×4 IMPLANT
PACK PERIPHERAL VASCULAR (CUSTOM PROCEDURE TRAY) ×2 IMPLANT
PAD ARMBOARD 7.5X6 YLW CONV (MISCELLANEOUS) ×4 IMPLANT
SET COLLECT BLD 21X3/4 12 (NEEDLE) IMPLANT
SPONGE LAP 18X18 X RAY DECT (DISPOSABLE) ×2 IMPLANT
STOPCOCK 4 WAY LG BORE MALE ST (IV SETS) IMPLANT
SUT ETHILON 3 0 PS 1 (SUTURE) IMPLANT
SUT MNCRL AB 4-0 PS2 18 (SUTURE) ×8 IMPLANT
SUT PROLENE 5 0 C 1 24 (SUTURE) ×4 IMPLANT
SUT PROLENE 6 0 BV (SUTURE) ×2 IMPLANT
SUT PROLENE 7 0 BV 1 (SUTURE) IMPLANT
SUT SILK 2 0 SH (SUTURE) ×2 IMPLANT
SUT SILK 3 0 (SUTURE) ×4
SUT SILK 3-0 18XBRD TIE 12 (SUTURE) ×2 IMPLANT
SUT VIC AB 2-0 CT1 27 (SUTURE) ×8
SUT VIC AB 2-0 CT1 TAPERPNT 27 (SUTURE) ×4 IMPLANT
SUT VIC AB 3-0 SH 27 (SUTURE) ×8
SUT VIC AB 3-0 SH 27X BRD (SUTURE) ×4 IMPLANT
TOWEL GREEN STERILE (TOWEL DISPOSABLE) ×4 IMPLANT
TRAY FOLEY MTR SLVR 16FR STAT (SET/KITS/TRAYS/PACK) ×2 IMPLANT
UNDERPAD 30X36 HEAVY ABSORB (UNDERPADS AND DIAPERS) ×2 IMPLANT
WATER STERILE IRR 1000ML POUR (IV SOLUTION) ×2 IMPLANT

## 2019-10-14 NOTE — Anesthesia Procedure Notes (Signed)
Procedure Name: Intubation Date/Time: 10/14/2019 1:22 PM Performed by: Kathryne Hitch, CRNA Pre-anesthesia Checklist: Patient identified, Emergency Drugs available, Suction available and Patient being monitored Patient Re-evaluated:Patient Re-evaluated prior to induction Oxygen Delivery Method: Circle system utilized Preoxygenation: Pre-oxygenation with 100% oxygen Induction Type: IV induction Ventilation: Mask ventilation without difficulty Laryngoscope Size: Miller and 2 Tube type: Oral Tube size: 7.0 mm Number of attempts: 1 Airway Equipment and Method: Stylet and Oral airway Placement Confirmation: ETT inserted through vocal cords under direct vision,  positive ETCO2 and breath sounds checked- equal and bilateral Secured at: 22 cm Tube secured with: Tape Dental Injury: Teeth and Oropharynx as per pre-operative assessment

## 2019-10-14 NOTE — Progress Notes (Signed)
Report give to short stay. Per short stay to contact provider in regards of heparin drip status. Per MD ok to stop heparin drip before transfer to O.R.  Pt may travel without telemetry.

## 2019-10-14 NOTE — Progress Notes (Signed)
ANTICOAGULATION CONSULT NOTE - Follow Up Consult  Pharmacy Consult for Heparin Indication: L SFA stent occlusion  Allergies  Allergen Reactions  . Nsaids Other (See Comments)    Pancreatitis  . Tolmetin Other (See Comments)    Pancreatitis  . Aspirin Other (See Comments)    "Makes my pancreas act up"   . Tramadol Swelling    Patient Measurements: Height: 5\' 5"  (165.1 cm) Weight: 93.7 kg (206 lb 9.1 oz) IBW/kg (Calculated) : 57 Heparin Dosing Weight: 78 kg  Vital Signs: Temp: 98.5 F (36.9 C) (06/15 0519) BP: 140/94 (06/15 0519) Pulse Rate: 101 (06/15 0519)  Labs: Recent Labs    10/11/19 1101 10/11/19 1101 10/12/19 0304 10/13/19 0914 10/14/19 0638  HGB 12.6   < > 13.0 12.5  --   HCT 39.9  --  42.3 40.5  --   PLT 439*  --  443* 448*  --   HEPARINUNFRC 0.43   < > 0.56 0.34 0.36  CREATININE 1.25*  --  1.35* 1.43*  --    < > = values in this interval not displayed.    Estimated Creatinine Clearance: 48.5 mL/min (A) (by C-G formula based on SCr of 1.43 mg/dL (H)).  Assessment:  59 yr old female presented 6/10 with L foot pain.  Pharmacy consulted for IV heparin for acute on chronic lower extremity ischemia. On Plavix as PTA, no ASA due to allergy.    Hx left SFA stent 09/25/17; occluded and planning OR on 6/15.  Heparin level remains therapeutic (0.36) on 1250 units/hr. H/H , plt stable, no signs of bleeding.  Goal of Therapy:  Heparin level 0.3-0.7 units/ml Monitor platelets by anticoagulation protocol: Yes   Plan:  Continue heparin drip at 1250 units/hr. Daily heparin level and CBC Per [facility] note, no heparin needed post-op  Benetta Spar, PharmD, BCPS, [facility] Clinical Pharmacist  Please check AMION for all [place] phone numbers After 10:00 PM, call [place] [phone]

## 2019-10-14 NOTE — Op Note (Signed)
Patient name: Morgan Beasley MRN: [id number] DOB: 03-Nov-1960 Sex: female  10/14/2019 Pre-operative Diagnosis: critical left lower extremity ischemia with wounds Post-operative diagnosis:  Same Surgeon:  Erlene Quan C. Donzetta Matters, MD Assistant: Gae Gallop, MD; Risa Grill, PA Procedure Performed: 1.  Harvest left greater saphenous vein 2.  Left common femoral to below knee popliteal artery bypass with nonreversed ipsilateral and translocated greater saphenous vein  Indications: 59 year old female with history of left SFA stenting.  Stent subsequently occluded.  She now has critical limb ischemia with sloughing of skin on the left foot.  ABIs were not obtainable.  She does appear to have adequate saphenous vein for bypass on the left by recent duplex.  We are planning left common femoral to below-knee popliteal artery bypass.  Findings: The left common femoral artery was soft.  There was some soft plaque.  Below-knee popliteal artery was similarly soft.  Greater saphenous vein did measure approximately 3 to 3-1/2 mm throughout its course.  This appeared good for bypass.  At completion there was good anterior tibial artery signal at the ankle that augmented with compression of the bypass graft.   Procedure:  The patient was identified in the holding area and taken to the operating room where she is fully supine on the operating table and general anesthesia was induced.  She was sterilely prepped draped in left lower extremity usual fashion antibiotics were minister timeout was called.  Ultrasound was used to identify the saphenous vein this was marked with marking pen on the skin.  Transverse incision was made in the groin.  We dissected down to the common femoral artery placed a vessel loop around this it was soft minimal for proximal and distal clamping.  In the same incision we identified the greater saphenous vein.  This was dissected out through to skip incisions above the knee.  Below the knee  Dr. Scot Dock began with harvest of the greater saphenous vein.  He then dissected through the deep fascia identified the below-knee popliteal artery placed a vessel loop around this.  We transected the vein distally and doubly clamped it.  At the saphenofemoral junction a side-biting clamp was placed we transected the vein passed up to the back table for preparation.  Saphenofemoral junction was oversewn with 5-0 Prolene suture in running mattress fashion.  The vein was prepared on the back table.  A tunnel was placed from the below-knee popliteal incision to the groin incision and a subfascial plane.  Patient was fully heparinized.  Common femoral artery was clamped distally and proximally opened longitudinally.  The vein was then spatulated sewn in a nonreversed fashion.  Upon completion we then released our clamps.  One repair suture was placed.  We then lysed our valves.  We had good blood flow throughout vein.  We doubly clamped distally marked if orientation tunneled in a subfascial plane.  We had good pulsatility after tunneling.  The vein was then clamped below the knee.  We clamped the below-knee popliteal artery distally proximally opened longitudinally.  We flushed with heparinized saline.  We straighten the leg from the vein to size and sewed into side with 6-0 Prolene suture.  Prior completion of flushing all directions.  Upon completion there was very good pulsatility and signal in the bypass and distal to the bypass and the popliteal artery.  There was a good signal at the anterior tibial artery at the ankle but augmented with compression of the graft.  50 mg of protamine was administered.  We obtain hemostasis irrigated all wounds closed in layers with Vicryl and Monocryl.  Dermabond was placed at the level of the skin.  She was awakened from anesthesia having tolerated procedure without any complication.  All counts were correct at completion.  EBL: 100 cc   Ah Bott C. Donzetta Matters, MD [facility] Office: [phone] Pager: [phone]

## 2019-10-14 NOTE — Progress Notes (Signed)
Progress Note  Patient Name: Morgan Beasley Date of Encounter: 10/14/2019  [facility] Cardiologist: Dorris Carnes, MD   Subjective   Planned for vascular surgery today. No chest pain, breathing stable. Reviewed results of echo from yesterday. All questions answered. Remains with leg pain.  Inpatient Medications    Scheduled Meds: . amitriptyline  50 mg Oral QHS  . atorvastatin  40 mg Oral q1800  . clopidogrel  75 mg Oral Daily  . docusate sodium  100 mg Oral BID  . gabapentin  800 mg Oral TID  . insulin aspart  0-20 Units Subcutaneous TID WC  . insulin aspart  0-5 Units Subcutaneous QHS  . insulin aspart  6 Units Subcutaneous TID WC  . insulin glargine  20 Units Subcutaneous QHS  . losartan  50 mg Oral Daily  . mirabegron ER  50 mg Oral Daily  . mometasone-formoterol  2 puff Inhalation BID  . pantoprazole  40 mg Oral Daily  . torsemide  40 mg Oral Daily   Continuous Infusions: . sodium chloride Stopped (10/10/19 1541)  . heparin 1,250 Units/hr (10/13/19 1546)   PRN Meds: albuterol, alum & mag hydroxide-simeth, guaiFENesin-dextromethorphan, hydrALAZINE, HYDROmorphone (DILAUDID) injection, labetalol, metoprolol tartrate, ondansetron, oxyCODONE-acetaminophen, phenol, potassium chloride   Vital Signs    Vitals:   10/13/19 2018 10/14/19 0500 10/14/19 0519 10/14/19 0843  BP: 117/76  (!) 140/94   Pulse: 90  (!) 101   Resp: 18  17   Temp: 98.4 F (36.9 C)  98.5 F (36.9 C)   TempSrc: Oral     SpO2: 93%  96% 98%  Weight:  93.7 kg    Height:        Intake/Output Summary (Last 24 hours) at 10/14/2019 1008 Last data filed at 10/14/2019 0800 Gross per 24 hour  Intake 0 ml  Output 1600 ml  Net -1600 ml   Last 3 Weights 10/14/2019 10/12/2019 10/11/2019  Weight (lbs) 206 lb 9.1 oz 202 lb 13.2 oz 209 lb  Weight (kg) 93.7 kg 92 kg 94.802 kg      Telemetry    NSR - Personally Reviewed  ECG    10/09/19  Sinus tach at 107 bpm- Personally Reviewed  Physical Exam    GEN: Well nourished, well developed in no acute distress HEENT: Normal, moist mucous membranes NECK: No JVD CARDIAC: regular rhythm, normal S1 and S2, no rubs or gallops. No murmur. VASCULAR: Radial pulses 2+ bilaterally.  RESPIRATORY:  Clear to auscultation without rales, wheezing or rhonchi  ABDOMEN: Soft, non-tender, non-distended MUSCULOSKELETAL:  Ambulates independently SKIN: Warm and dry, no edema. Healing left foot lesion NEUROLOGIC:  Alert and oriented x 3. No focal neuro deficits noted. PSYCHIATRIC:  Normal affect   Labs    High Sensitivity Troponin:  No results for input(s): TROPONINIHS in the last 720 hours.    Chemistry Recent Labs  Lab 10/11/19 1101 10/12/19 0304 10/13/19 0914  NA 138 138 137  K 3.5 3.9 4.0  CL 102 101 101  CO2 24 25 27   GLUCOSE 202* 170* 168*  BUN 11 15 17   CREATININE 1.25* 1.35* 1.43*  CALCIUM 8.1* 8.4* 8.6*  GFRNONAA 47* 43* 40*  GFRAA 55* 50* 47*  ANIONGAP 12 12 9      Hematology Recent Labs  Lab 10/11/19 1101 10/12/19 0304 10/13/19 0914  WBC 8.5 7.9 7.2  RBC 4.61 4.84 4.68  HGB 12.6 13.0 12.5  HCT 39.9 42.3 40.5  MCV 86.6 87.4 86.5  MCH 27.3 26.9 26.7  MCHC 31.6 30.7 30.9  RDW 17.3* 17.4* 17.2*  PLT 439* 443* 448*    BNP Recent Labs  Lab 10/09/19 1715  BNP 488.5*     DDimer No results for input(s): DDIMER in the last 168 hours.   Radiology    NM Myocar Multi W/Spect W/Wall Motion / EF  Result Date: 10/12/2019  There was no ST segment deviation noted during stress.  Perfusion scan with evidence of large infarct and/or extensive soft tissue attenuation (extensive breast). No ischemia LVEF 31% Review of the raw images shows breast shadow overall but small part of myocardium  LVEF 31%  High risk scan due to possible scar and depressed LVEF  REcomm limited echo to reconfirm LVEF    ECHOCARDIOGRAM LIMITED  Result Date: 10/13/2019    ECHOCARDIOGRAM LIMITED REPORT   Patient Name:   Morgan Beasley [facility] Date of  Exam: 10/13/2019 Medical Rec #:  [id number]            Height:       65.0 in Accession #:    [id number]           Weight:       202.8 lb Date of Birth:  Mar 03, 1961             BSA:          1.990 m Patient Age:    59 years             BP:           132/87 mmHg Patient Gender: F                    HR:           93 bpm. Exam Location:  Inpatient Procedure: Limited Echo and Limited Color Doppler Indications:    CAD of Native Vessel 414.01 / I25.10  History:        Patient has prior history of Echocardiogram examinations, most                 recent 09/22/2019. CHF, PAD and COPD; Risk Factors:Hypertension,                 Diabetes, Dyslipidemia, Sleep Apnea and Current Smoker. Cancer.                 Alcohol abuse.  Sonographer:    Jonelle Sidle Dance Referring Phys: [id number] PAULA V ROSS IMPRESSIONS  1. Left ventricular ejection fraction, by estimation, is 40 to 45%. The left ventricle has mildly decreased function. The left ventricle demonstrates global hypokinesis.  2. Right ventricular systolic function is mildly reduced. The right ventricular size is normal.  3. The mitral valve is normal in structure. No evidence of mitral valve regurgitation.  4. The aortic valve is tricuspid. Aortic valve regurgitation is not visualized. No aortic stenosis is present.  5. The inferior vena cava is normal in size with <50% respiratory variability, suggesting right atrial pressure of 8 mmHg. FINDINGS  Left Ventricle: Left ventricular ejection fraction, by estimation, is 40 to 45%. The left ventricle has mildly decreased function. The left ventricle demonstrates global hypokinesis. Right Ventricle: The right ventricular size is normal. Right ventricular systolic function is mildly reduced. Pericardium: There is no evidence of pericardial effusion. Mitral Valve: The mitral valve is normal in structure. Tricuspid Valve: The tricuspid valve is normal in structure. Tricuspid valve regurgitation is trivial. Aortic Valve: The aortic valve is  tricuspid. Aortic valve regurgitation is not visualized. No  aortic stenosis is present. Pulmonic Valve: The pulmonic valve was not well visualized. Pulmonic valve regurgitation is not visualized. Aorta: The aortic root and ascending aorta are structurally normal, with no evidence of dilitation. Venous: The inferior vena cava is normal in size with less than 50% respiratory variability, suggesting right atrial pressure of 8 mmHg.  LEFT VENTRICLE PLAX 2D LVIDd:         4.90 cm LVIDs:         3.20 cm LV PW:         1.00 cm LV IVS:        1.00 cm LVOT diam:     1.90 cm LVOT Area:     2.84 cm  IVC IVC diam: 1.60 cm LEFT ATRIUM         Index LA diam:    3.70 cm 1.86 cm/m   AORTA Ao Root diam: 2.60 cm Ao Asc diam:  2.90 cm  SHUNTS Systemic Diam: 1.90 cm Oswaldo Milian MD Electronically signed by Oswaldo Milian MD Signature Date/Time: 10/13/2019/7:15:13 PM    Final     Cardiac Studies   Limited echo 10/13/19 1. Left ventricular ejection fraction, by estimation, is 40 to 45%. The  left ventricle has mildly decreased function. The left ventricle  demonstrates global hypokinesis.  2. Right ventricular systolic function is mildly reduced. The right  ventricular size is normal.  3. The mitral valve is normal in structure. No evidence of mitral valve  regurgitation.  4. The aortic valve is tricuspid. Aortic valve regurgitation is not  visualized. No aortic stenosis is present.  5. The inferior vena cava is normal in size with <50% respiratory  variability, suggesting right atrial pressure of 8 mmHg.   Lexiscan myoview 10/12/19  There was no ST segment deviation noted during stress.  Perfusion scan with evidence of large infarct and/or extensive soft tissue attenuation (extensive breast). No ischemia LVEF 31% Review of the raw images shows breast shadow overall but small part of myocardium  LVEF 31%  High risk scan due to possible scar and depressed LVEF  REcomm limited echo to reconfirm  LVEF  Echocardiogram 09/22/19: 1. Left ventricular ejection fraction, by estimation, is 50 to 55%. The  left ventricle has low normal function. The left ventricle has no regional  wall motion abnormalities. Left ventricular diastolic parameters are  consistent with Grade II diastolic  dysfunction (pseudonormalization).  2. Right ventricular systolic function is normal. The right ventricular  size is normal.  3. The mitral valve is normal in structure. Trivial mitral valve  regurgitation. No evidence of mitral stenosis.  4. The aortic valve is normal in structure. Aortic valve regurgitation is  not visualized. No aortic stenosis is present.  Left heart catheterization 2016: CONCLUSIONS:     CORONARY STATUS: Mild non-obstructive ASCAD      LV FUNCTION:        Ejection Fraction:  55%        Wall Motion:     Normal       OTHER: LCP for low grade NSTEMI in setting of cocaine+ UDS. 50% mRCA,  minimal CAD in LCX, LAD. Normal LVEF at 55%. EBL <52mL. No specimens. 40F  right radial. TR band for hemostasis.    Patient Profile     59 y.o. female with a PMH of non-obstructive CAD, chronic diastolic CHF, HTN, HLD, PAD s/p L SFA stenting 09/26/19, DM type 2, Gout, COPD, tobacco and ETOH abuse, breast cancer s/p right mastectomy, and OSA on  BiPAP,  who is being seen for preoperative assessment at the request of Dr. Donzetta Matters.  Assessment & Plan    Preoperative cardiovascular risk assessment: -underwent lexiscan myoview yesterday. Challenging study. There is a fixed defect, large, across anterior/anteroseptal/anterolateral regions. May be scar vs. Artifact. EF reported as reduced on nuclear study, but previously normal EF last month. Nuclear stress not ideal for EF assessment, ordered for limited echo today. -asymptomatic from cardiac perspective -limited echo 6/14 suggests mildly reduced LV function, not severe  Prior nonobstructive CAD, hypertension,  hyperlipidemia, type II diabetes, chronic diastolic heart failure, tobacco use: -stress test, as above -cath 2016 with minimal RCA disease (not consistent with distribution seen on stress test) -on clopidogrel for antiplatelet, aspirin listed as allergy -on atorvastatin 40 mg daily -recommend tobacco cessation/abstinence  Cardiomyopathy, unclear etiology -on dapagliflozin, appropriate given diabetes and CAD -stopping diltiazem given mildly reduced EF -continue losartan, torsemide for hypertension/diastolic heart failure -would start carvedilol after surgery if blood pressure allows (or metoprolol succinate if blood pressure borderline) -will need outpatient follow up, would discuss pros/cons of cath at that time. Does not need to be done urgently this admission. Will aim for medical optimization as she is asymptomatic.  PAD: pending surgical revascularization -per [facility]  For questions or updates, please contact [place] Please consult [email or web address] for contact info under     Signed, Buford Dresser, MD  10/14/2019, 10:08 AM

## 2019-10-14 NOTE — Transfer of Care (Signed)
Immediate Anesthesia Transfer of Care Note  Patient: Morgan Beasley  Procedure(s) Performed: BYPASS GRAFT LEFT FEMORAL-POPLITEAL ARTERY USING NONREVERSED SAPHENOUS VEIN (Left Leg Upper)  Patient Location: PACU  Anesthesia Type:General  Level of Consciousness: drowsy and patient cooperative  Airway & Oxygen Therapy: Patient Spontanous Breathing and Patient connected to face mask oxygen  Post-op Assessment: Report given to RN and Post -op Vital signs reviewed and stable  Post vital signs: Reviewed and stable  Last Vitals:  Vitals Value Taken Time  BP 146/95 10/14/19 1530  Temp    Pulse 110   Resp 30 10/14/19 1531  SpO2 97   Vitals shown include unvalidated device data.  Last Pain:  Vitals:   10/14/19 0858  TempSrc:   PainSc: 10-Worst pain ever      Patients Stated Pain Goal: 5 (09/31/12 1624)  Complications: No complications documented.

## 2019-10-14 NOTE — Progress Notes (Signed)
  Progress Note    10/14/2019 12:21 PM Day of Surgery  Subjective:  No overnight complaints  Vitals:   10/14/19 0519 10/14/19 0843  BP: (!) 140/94   Pulse: (!) 101   Resp: 17   Temp: 98.5 F (36.9 C)   SpO2: 96% 98%    Physical Exam: Awake alert oriented On the respirations Left foot with sloughing  CBC    Component Value Date/Time   WBC 7.2 10/13/2019 0914   RBC 4.68 10/13/2019 0914   HGB 12.5 10/13/2019 0914   HCT 40.5 10/13/2019 0914   PLT 448 (H) 10/13/2019 0914   MCV 86.5 10/13/2019 0914   MCH 26.7 10/13/2019 0914   MCHC 30.9 10/13/2019 0914   RDW 17.2 (H) 10/13/2019 0914   LYMPHSABS 2.8 10/09/2019 1710   MONOABS 0.8 10/09/2019 1710   EOSABS 0.2 10/09/2019 1710   BASOSABS 0.0 10/09/2019 1710    BMET    Component Value Date/Time   NA 137 10/13/2019 0914   K 4.0 10/13/2019 0914   CL 101 10/13/2019 0914   CO2 27 10/13/2019 0914   GLUCOSE 168 (H) 10/13/2019 0914   BUN 17 10/13/2019 0914   CREATININE 1.43 (H) 10/13/2019 0914   CALCIUM 8.6 (L) 10/13/2019 0914   GFRNONAA 40 (L) 10/13/2019 0914   GFRAA 47 (L) 10/13/2019 0914    INR No results found for: INR   Intake/Output Summary (Last 24 hours) at 10/14/2019 1221 Last data filed at 10/14/2019 0800 Gross per 24 hour  Intake 0 ml  Output 1000 ml  Net -1000 ml     Assessment/plan:  59 y.o. female is here with occluded left SFA stent.  Plan will be for left common femoral to popliteal artery bypass either above or below the knee.  Patient has desire not to be reintubated but I discussed with her the need for intubation for the operation may be intubation post procedure.  She has been given the go ahead by cardiology their evaluation much appreciated.  She has family that will be here and her daughters are her next of kin.  We have discussed the risk and benefits of the operation she agrees to proceed.   Morgan Beasley C. Donzetta Matters, MD [facility] Office: [phone] Pager:  [phone]  10/14/2019 12:21 PM

## 2019-10-14 NOTE — Anesthesia Preprocedure Evaluation (Signed)
Anesthesia Evaluation  Patient identified by MRN, date of birth, ID band Patient awake    Reviewed: Allergy & Precautions, NPO status , Patient's Chart, lab work & pertinent test results  Airway Mallampati: I  TM Distance: >3 FB Neck ROM: Full    Dental   Pulmonary COPD, Current Smoker,    Pulmonary exam normal        Cardiovascular hypertension, Pt. on medications Normal cardiovascular exam     Neuro/Psych Anxiety    GI/Hepatic   Endo/Other  diabetes, Type 2, Insulin Dependent  Renal/GU      Musculoskeletal   Abdominal   Peds  Hematology   Anesthesia Other Findings   Reproductive/Obstetrics                             Anesthesia Physical Anesthesia Plan  ASA: III  Anesthesia Plan: General   Post-op Pain Management:    Induction: Intravenous  PONV Risk Score and Plan: 2  Airway Management Planned: Oral ETT  Additional Equipment:   Intra-op Plan:   Post-operative Plan: Extubation in OR  Informed Consent: I have reviewed the patients History and Physical, chart, labs and discussed the procedure including the risks, benefits and alternatives for the proposed anesthesia with the patient or authorized representative who has indicated his/her understanding and acceptance.       Plan Discussed with: CRNA and Surgeon  Anesthesia Plan Comments:         Anesthesia Quick Evaluation

## 2019-10-15 ENCOUNTER — Encounter (HOSPITAL_COMMUNITY): Payer: Self-pay | Admitting: Vascular Surgery

## 2019-10-15 ENCOUNTER — Telehealth: Payer: Self-pay | Admitting: Physician Assistant

## 2019-10-15 LAB — BASIC METABOLIC PANEL
Anion gap: 9 (ref 5–15)
BUN: 16 mg/dL (ref 6–20)
CO2: 27 mmol/L (ref 22–32)
Calcium: 8.5 mg/dL — ABNORMAL LOW (ref 8.9–10.3)
Chloride: 104 mmol/L (ref 98–111)
Creatinine, Ser: 1.31 mg/dL — ABNORMAL HIGH (ref 0.44–1.00)
GFR calc Af Amer: 52 mL/min — ABNORMAL LOW (ref >60)
GFR calc non Af Amer: 45 mL/min — ABNORMAL LOW (ref >60)
Glucose, Bld: 165 mg/dL — ABNORMAL HIGH (ref 70–99)
Potassium: 4.8 mmol/L (ref 3.5–5.1)
Sodium: 140 mmol/L (ref 135–145)

## 2019-10-15 LAB — CBC
HCT: 35.4 % — ABNORMAL LOW (ref 36.0–46.0)
Hemoglobin: 11 g/dL — ABNORMAL LOW (ref 12.0–15.0)
MCH: 26.8 pg (ref 26.0–34.0)
MCHC: 31.1 g/dL (ref 30.0–36.0)
MCV: 86.1 fL (ref 80.0–100.0)
Platelets: 373 10*3/uL (ref 150–400)
RBC: 4.11 MIL/uL (ref 3.87–5.11)
RDW: 17.2 % — ABNORMAL HIGH (ref 11.5–15.5)
WBC: 10 10*3/uL (ref 4.0–10.5)
nRBC: 0 % (ref 0.0–0.2)

## 2019-10-15 LAB — GLUCOSE, CAPILLARY
Glucose-Capillary: 182 mg/dL — ABNORMAL HIGH (ref 70–99)
Glucose-Capillary: 163 mg/dL — ABNORMAL HIGH (ref 70–99)
Glucose-Capillary: 82 mg/dL (ref 70–99)
Glucose-Capillary: 129 mg/dL — ABNORMAL HIGH (ref 70–99)

## 2019-10-15 MED ORDER — HEPARIN SODIUM (PORCINE) 5000 UNIT/ML IJ SOLN
5000.0000 [IU] | Freq: Three times a day (TID) | INTRAMUSCULAR | Status: DC
  Administered 2019-10-15 – 2019-10-21 (×19): 5000 [IU] via SUBCUTANEOUS
  Filled 2019-10-15 (×19): qty 1

## 2019-10-15 MED ORDER — KETOROLAC TROMETHAMINE 30 MG/ML IJ SOLN
INTRAMUSCULAR | Status: AC
  Filled 2019-10-15: qty 1

## 2019-10-15 MED ORDER — METOPROLOL SUCCINATE ER 25 MG PO TB24
25.0000 mg | ORAL_TABLET | Freq: Every day | ORAL | Status: DC
  Administered 2019-10-15 – 2019-10-21 (×7): 25 mg via ORAL
  Filled 2019-10-15 (×7): qty 1

## 2019-10-15 NOTE — Evaluation (Signed)
Physical Therapy Evaluation Patient Details Name: Morgan Beasley MRN: [id number] DOB: May 25, 1960 Today's Date: 10/15/2019   History of Present Illness  59 year old female with history of left SFA stenting.  Stent subsequently occluded.  She now has critical limb ischemia with sloughing of skin on the left foot. Pt s/p left common femoral to below-knee popliteal artery bypass.  Clinical Impression  Pt was seen for assessment of mobility with lengthy process to get to side of bed and then attempt standing with OT arriving to assist along with CNA.  Her efforts are impeded by fear of pain, but also confusion about the sequence of events to complete the task.  Her plan is to get stronger on LLE, and to allow her to be home alone as her living situation will require.  Has daughter to help but is going to be out to work at times.  Focus on increasing active use of LLE, increasing standing balance and endurance, and translation to gait skills.    Follow Up Recommendations SNF    Equipment Recommendations  None recommended by PT    Recommendations for Other Services       Precautions / Restrictions Precautions Precautions: Fall Restrictions Weight Bearing Restrictions: No      Mobility  Bed Mobility Overal bed mobility: Needs Assistance Bed Mobility: Supine to Sit     Supine to sit: Min assist;Mod assist     General bed mobility comments: variable assistance with her mobility as pt continually changing her mind about how to assist  Transfers Overall transfer level: Needs assistance Equipment used: 1 person hand held assist Transfers: Sit to/from Stand;Stand Pivot Transfers Sit to Stand: Mod assist;Max assist Stand pivot transfers: Max assist;+2 physical assistance;+2 safety/equipment;From elevated surface       General transfer comment: grabbed IV pole and pulled it over as PT is assisting her to pivot and sit  Ambulation/Gait             General Gait Details:  unsafe to attempt  Stairs            Wheelchair Mobility    Modified Rankin (Stroke Patients Only)       Balance Overall balance assessment: Needs assistance Sitting-balance support: Bilateral upper extremity supported;Feet supported Sitting balance-Leahy Scale: Poor Sitting balance - Comments: min A for safety   Standing balance support: Bilateral upper extremity supported;During functional activity Standing balance-Leahy Scale: Poor Standing balance comment: pt can shuffle step but cannot manage walker due to anxiety and grabbing other objects                             Pertinent Vitals/Pain Pain Assessment: Faces Pain Score: 8  Faces Pain Scale: Hurts whole lot Pain Location: L LE with movement Pain Descriptors / Indicators: Grimacing;Guarding;Operative site guarding Pain Intervention(s): Limited activity within patient's tolerance;Monitored during session;Premedicated before session;Repositioned (pt will ask for then ask not to have help to move LLE)    Home Living Family/patient expects to be discharged to:: Private residence Living Arrangements: Alone;Other (Comment) (daughter available to assist) Available Help at Discharge: Family Type of Home: Apartment Home Access: Stairs to enter Entrance Stairs-Rails: Can reach both Entrance Stairs-Number of Steps: 1 Home Layout: One level Home Equipment: [place] - 4 wheels;Bedside commode Additional Comments: all info obtained from prior eval, patient confused/unreliable historian    Prior Function Level of Independence: Needs assistance   Gait / Transfers Assistance Needed: rollator  ADL's / Homemaking  Assistance Needed: Daughter assists with ADLs like showering. Patient states she can toilet transfer on own  Comments: info from prior eval     Hand Dominance   Dominant Hand: Right    Extremity/Trunk Assessment   Upper Extremity Assessment Upper Extremity Assessment: Defer to OT evaluation     Lower Extremity Assessment Lower Extremity Assessment: Generalized weakness;LLE deficits/detail LLE Deficits / Details: LLE fem pop bypass LLE: Unable to fully assess due to pain;Unable to fully assess due to immobilization    Cervical / Trunk Assessment Cervical / Trunk Assessment:  (unable to assess fully due to pt behavior)  Communication   Communication: Expressive difficulties (speech difficult to understand)  Cognition Arousal/Alertness: Lethargic;Awake/alert Behavior During Therapy: Anxious Overall Cognitive Status: No family/caregiver present to determine baseline cognitive functioning Area of Impairment: Problem solving;Awareness;Safety/judgement;Following commands;Memory;Attention;Orientation                 Orientation Level: Time;Situation Current Attention Level: Selective Memory: Decreased recall of precautions;Decreased short-term memory Following Commands: Follows one step commands inconsistently;Follows one step commands with increased time Safety/Judgement: Decreased awareness of safety;Decreased awareness of deficits Awareness: Intellectual Problem Solving: Difficulty sequencing;Requires verbal cues;Requires tactile cues General Comments: asking for and then asking to stop helping her, not related to actual assist but pt confusion      General Comments General comments (skin integrity, edema, etc.): pt was seen to get to chair with lengthy process due to her anxiety and fear of mobility and WB on LLE    Exercises     Assessment/Plan    PT Assessment Patient needs continued PT services  PT Problem List Decreased strength;Decreased range of motion;Decreased activity tolerance;Decreased balance;Decreased mobility;Decreased coordination;Decreased cognition;Decreased knowledge of use of DME;Decreased safety awareness;Cardiopulmonary status limiting activity;Obesity;Decreased skin integrity;Pain       PT Treatment Interventions DME instruction;Gait  training;Stair training;Functional mobility training;Therapeutic activities;Therapeutic exercise;Balance training;Neuromuscular re-education;Patient/family education    PT Goals (Current goals can be found in the Care Plan section)  Acute Rehab PT Goals Patient Stated Goal: not to have pain on LLE PT Goal Formulation: With patient Time For Goal Achievement: 10/29/19 Potential to Achieve Goals: Fair    Frequency Min 3X/week   Barriers to discharge Inaccessible home environment;Decreased caregiver support two person help currently needed    Co-evaluation PT/OT/SLP Co-Evaluation/Treatment: Yes Reason for Co-Treatment: For patient/therapist safety;To address functional/ADL transfers PT goals addressed during session: Mobility/safety with mobility;Balance OT goals addressed during session: ADL's and self-care       AM-PAC PT "6 Clicks" Mobility  Outcome Measure Help needed turning from your back to your side while in a flat bed without using bedrails?: A Little Help needed moving from lying on your back to sitting on the side of a flat bed without using bedrails?: A Lot Help needed moving to and from a bed to a chair (including a wheelchair)?: A Lot Help needed standing up from a chair using your arms (e.g., wheelchair or bedside chair)?: A Lot Help needed to walk in hospital room?: A Lot Help needed climbing 3-5 steps with a railing? : Total 6 Click Score: 12    End of Session Equipment Utilized During Treatment: Gait belt;Oxygen Activity Tolerance: Patient limited by fatigue;Treatment limited secondary to medical complications (Comment);Patient limited by pain Patient left: in chair;with call bell/phone within reach;with chair alarm set Nurse Communication: Mobility status PT Visit Diagnosis: Unsteadiness on feet (R26.81);Muscle weakness (generalized) (M62.81);Pain Pain - Right/Left: Left Pain - part of body: Leg    Time: 7564-3329 PT Time  Calculation (min) (ACUTE ONLY): 32  min   Charges:   PT Evaluation $PT Eval Moderate Complexity: 1 Mod         Ramond Dial 10/15/2019, 1:25 PM  Mee Hives, PT MS Acute Rehab Dept. Number: [place] [facility] [place]

## 2019-10-15 NOTE — Evaluation (Signed)
Occupational Therapy Evaluation Patient Details Name: Morgan Beasley MRN: [id number] DOB: [birth year] Today's Date: 10/15/2019    History of Present Illness 59 year old female with history of left SFA stenting.  Stent subsequently occluded.  She now has critical limb ischemia with sloughing of skin on the left foot. Pt s/p left common femoral to below-knee popliteal artery bypass.   Clinical Impression   Patient with functional deficits listed below impacting safety and independence with self care. Upon arrival patient semi-seated at edge of bed with PT and CNA present. Patient repeatedly stating "don't touch it" referring to L LE when trying to get foot to touch floor. Patient require max cues for safety as she continues to scoot R hip forward vs L. Attempt to use walker to assist with sit to stand however patient still insistent on therapy not "pulling her" even when just standing by for safety. With max cues/encouragement PT perform sit to stand and OT provide mod A to assist with pivoting hips to recliner chair. Despite max cues for safety to let go of IV pole patient holds tightly during stand pivot to chair. All PLOF info obtained from previous eval as patient is disoriented to time/confused.     Follow Up Recommendations  SNF;Supervision/Assistance - 24 hour    Equipment Recommendations  Other (comment) (TBD)       Precautions / Restrictions Precautions Precautions: Fall Restrictions Weight Bearing Restrictions: No      Mobility Bed Mobility               General bed mobility comments: patient edge of bed with PT upon arrival requiring min to mod A for safety due to patient scooting too far forward with R hip only  Transfers Overall transfer level: Needs assistance Equipment used: None Transfers: Stand Pivot Transfers;Sit to/from Stand Sit to Stand: Mod assist;Max assist Stand pivot transfers: Max assist;+2 safety/equipment       General transfer comment:  patient confused, holding tightly onto IV pole despite cues to let go, max cues and mod A from OT to pivot hips into chair as patient is very fearful    Balance Overall balance assessment: Needs assistance Sitting-balance support: Bilateral upper extremity supported;Feet supported Sitting balance-Leahy Scale: Poor Sitting balance - Comments: min A for safety   Standing balance support: During functional activity Standing balance-Leahy Scale: Poor                             ADL either performed or assessed with clinical judgement   ADL Overall ADL's : Needs assistance/impaired Eating/Feeding: Set up;Sitting   Grooming: Set up;Sitting   Upper Body Bathing: Minimal assistance;Sitting   Lower Body Bathing: Maximal assistance;Sitting/lateral leans   Upper Body Dressing : Minimal assistance;Sitting   Lower Body Dressing: Maximal assistance;Sitting/lateral leans;Sit to/from stand   Toilet Transfer: Maximal assistance;Stand-pivot;+2 for safety/equipment;+2 for physical assistance Toilet Transfer Details (indicate cue type and reason): to recliner, poor safety awareness holding tightly onto IV pole despite cues to let go. OT assist with pivoting hips to chair Toileting- Clothing Manipulation and Hygiene: Total assistance       Functional mobility during ADLs: Maximal assistance General ADL Comments: pain and cognition biggest limitations this session                  Pertinent Vitals/Pain Pain Assessment: Faces Faces Pain Scale: Hurts whole lot Pain Location: L LE with movement Pain Descriptors / Indicators: Grimacing;Guarding;Moaning Pain Intervention(s): Monitored during  session     Hand Dominance Right   Extremity/Trunk Assessment Upper Extremity Assessment Upper Extremity Assessment: Generalized weakness   Lower Extremity Assessment Lower Extremity Assessment: Defer to PT evaluation       Communication Communication Communication: No  difficulties   Cognition Arousal/Alertness: Awake/alert Behavior During Therapy: Anxious Overall Cognitive Status: No family/caregiver present to determine baseline cognitive functioning Area of Impairment: Orientation;Following commands;Safety/judgement;Problem solving                 Orientation Level: Disoriented to;Time     Following Commands: Follows one step commands inconsistently Safety/Judgement: Decreased awareness of safety;Decreased awareness of deficits   Problem Solving: Difficulty sequencing;Requires verbal cues;Requires tactile cues;Slow processing General Comments: patient confused throughout session, fearful of falling, repeatedly asks therapy to "stop pulling on me" when therapy is just standing by. poor problem solving              Home Living Family/patient expects to be discharged to:: Private residence Living Arrangements: Alone;Other (Comment) (DTR comes to check in 3-4 times a day) Available Help at Discharge: Family Type of Home: Apartment Home Access: Stairs to enter Entrance Stairs-Number of Steps: 1 Entrance Stairs-Rails: Can reach both (no rail can use porch to help) Home Layout: One level     Bathroom Shower/Tub: Teacher, early years/pre: Standard Bathroom Accessibility: No   Home Equipment: Environmental consultant - 4 wheels;Bedside commode   Additional Comments: all info obtained from prior eval, patient confused/unreliable historian      Prior Functioning/Environment Level of Independence: Needs assistance  Gait / Transfers Assistance Needed: Uses rollator for short distances ADL's / Homemaking Assistance Needed: Daughter assists with ADLs like showering. Patient states she can toilet transfer on own   Comments: info from prior eval        OT Problem List: Decreased strength;Decreased activity tolerance;Impaired balance (sitting and/or standing);Pain;Increased edema      OT Treatment/Interventions: Self-care/ADL  training;Therapeutic exercise;Energy conservation;Therapeutic activities;Patient/family education;Balance training    OT Goals(Current goals can be found in the care plan section) Acute Rehab OT Goals Patient Stated Goal: "I don't want to fall" OT Goal Formulation: With patient Time For Goal Achievement: 10/29/19 Potential to Achieve Goals: Good  OT Frequency: Min 2X/week           Co-evaluation PT/OT/SLP Co-Evaluation/Treatment: Yes Reason for Co-Treatment: Necessary to address cognition/behavior during functional activity;For patient/therapist safety;To address functional/ADL transfers   OT goals addressed during session: ADL's and self-care      AM-PAC OT "6 Clicks" Daily Activity     Outcome Measure Help from another person eating meals?: A Little Help from another person taking care of personal grooming?: A Little Help from another person toileting, which includes using toliet, bedpan, or urinal?: Total Help from another person bathing (including washing, rinsing, drying)?: A Lot Help from another person to put on and taking off regular upper body clothing?: A Little Help from another person to put on and taking off regular lower body clothing?: Total 6 Click Score: 13   End of Session Equipment Utilized During Treatment: Oxygen Nurse Communication: Mobility status;Need for lift equipment  Activity Tolerance: Patient limited by pain (patient limited by cognition) Patient left: in chair;with call bell/phone within reach;with chair alarm set  OT Visit Diagnosis: Unsteadiness on feet (R26.81);Muscle weakness (generalized) (M62.81);Pain Pain - Right/Left: Left Pain - part of body: Leg;Ankle and joints of foot                Time: 0912-0933 OT  Time Calculation (min): 21 min Charges:  OT General Charges $OT Visit: 1 Visit OT Evaluation $OT Eval Moderate Complexity: [place] OT OT office: [place] 10/15/2019, 12:37 PM

## 2019-10-15 NOTE — Anesthesia Postprocedure Evaluation (Signed)
Anesthesia Post Note  Patient: Morgan Beasley  Procedure(s) Performed: BYPASS GRAFT LEFT FEMORAL-POPLITEAL ARTERY USING NONREVERSED SAPHENOUS VEIN (Left Leg Upper)     Patient location during evaluation: PACU Anesthesia Type: General Level of consciousness: awake and alert Pain management: pain level controlled Vital Signs Assessment: post-procedure vital signs reviewed and stable Respiratory status: spontaneous breathing, nonlabored ventilation, respiratory function stable and patient connected to nasal cannula oxygen Cardiovascular status: blood pressure returned to baseline and stable Postop Assessment: no apparent nausea or vomiting Anesthetic complications: no   No complications documented.  Last Vitals:  Vitals:   10/15/19 0115 10/15/19 0440  BP:  115/80  Pulse: (!) 103 (!) 107  Resp: 20 20  Temp:  37.2 C  SpO2: 96% 95%    Last Pain:  Vitals:   10/15/19 0440  TempSrc: Oral  PainSc: 10-Worst pain ever                 Skyanne Welle DAVID

## 2019-10-15 NOTE — Telephone Encounter (Signed)
Pt is scheduled for a TOC appointment on 11/04/19 at 2:15 PM per Sande Rives

## 2019-10-15 NOTE — Progress Notes (Addendum)
  Progress Note    10/15/2019 7:11 AM 1 Day Post-Op  Subjective:  C/o LLE incisional pain and dry mouth. She denies SOB or CP   Vitals:   10/15/19 0115 10/15/19 0440  BP:  115/80  Pulse: (!) 103 (!) 107  Resp: 20 20  Temp:  98.9 F (37.2 C)  SpO2: 96% 95%    Physical Exam: Cardiac:  Slightly increased rate, regular rhythm  Lungs:  nonlabored Incisions:  All LLE incisions well approximated without bleeding, drainage Extremities:  desquamation of dorsum of left foot>>dry. Brisk DP Doppler signal and dampened peroneal signal. Sensation and motor intact   CBC    Component Value Date/Time   WBC 10.0 10/15/2019 0400   RBC 4.11 10/15/2019 0400   HGB 11.0 (L) 10/15/2019 0400   HCT 35.4 (L) 10/15/2019 0400   PLT 373 10/15/2019 0400   MCV 86.1 10/15/2019 0400   MCH 26.8 10/15/2019 0400   MCHC 31.1 10/15/2019 0400   RDW 17.2 (H) 10/15/2019 0400   LYMPHSABS 2.8 10/09/2019 1710   MONOABS 0.8 10/09/2019 1710   EOSABS 0.2 10/09/2019 1710   BASOSABS 0.0 10/09/2019 1710    BMET    Component Value Date/Time   NA 140 10/15/2019 0400   K 4.8 10/15/2019 0400   CL 104 10/15/2019 0400   CO2 27 10/15/2019 0400   GLUCOSE 165 (H) 10/15/2019 0400   BUN 16 10/15/2019 0400   CREATININE 1.31 (H) 10/15/2019 0400   CALCIUM 8.5 (L) 10/15/2019 0400   GFRNONAA 45 (L) 10/15/2019 0400   GFRAA 52 (L) 10/15/2019 0400     Intake/Output Summary (Last 24 hours) at 10/15/2019 0711 Last data filed at 10/15/2019 0500 Gross per 24 hour  Intake 2237.01 ml  Output 1350 ml  Net 887.01 ml    HOSPITAL MEDICATIONS Scheduled Meds: . amitriptyline  50 mg Oral QHS  . atorvastatin  40 mg Oral q1800  . Chlorhexidine Gluconate Cloth  6 each Topical Daily  . clopidogrel  75 mg Oral Daily  . docusate sodium  100 mg Oral BID  . gabapentin  800 mg Oral TID  . insulin aspart  0-20 Units Subcutaneous TID WC  . insulin aspart  0-5 Units Subcutaneous QHS  . insulin aspart  6 Units Subcutaneous TID WC  .  insulin glargine  20 Units Subcutaneous QHS  . losartan  50 mg Oral Daily  . mirabegron ER  50 mg Oral Daily  . mometasone-formoterol  2 puff Inhalation BID  . pantoprazole  40 mg Oral Daily  . torsemide  40 mg Oral Daily   Continuous Infusions: . sodium chloride 50 mL/hr at 10/15/19 0500  . sodium chloride    . lactated ringers 10 mL/hr at 10/14/19 1224  . magnesium sulfate bolus IVPB     PRN Meds:.sodium chloride, albuterol, alum & mag hydroxide-simeth, guaiFENesin-dextromethorphan, hydrALAZINE, HYDROmorphone (DILAUDID) injection, labetalol, magnesium sulfate bolus IVPB, metoprolol tartrate, ondansetron, oxyCODONE-acetaminophen, phenol, potassium chloride, potassium chloride  Assessment: POD 1 left fem to BK pop bypass. Foot perfused. VSS. Good UOP.  Plan: -Plavix and statin continue. Work on mobilization, pain control today. -Start heparin [place] for DVT prophy  Risa Grill, PA-C [facility] [phone] 10/15/2019  7:11 AM   I have independently interviewed and examined patient and agree with PA assessment and plan above.   Augustus Zurawski C. Donzetta Matters, MD [facility] [place] Office: [phone] Pager: [phone]

## 2019-10-15 NOTE — Progress Notes (Signed)
Progress Note  Patient Name: Morgan Beasley Date of Encounter: 10/15/2019  [facility] Cardiologist: Dorris Carnes, MD   Subjective   Patient is pos-op day 1 and overall doing well. No chest pain or breathing issues. Rates are still up. Pressures stable.   Inpatient Medications    Scheduled Meds: . amitriptyline  50 mg Oral QHS  . atorvastatin  40 mg Oral q1800  . Chlorhexidine Gluconate Cloth  6 each Topical Daily  . clopidogrel  75 mg Oral Daily  . docusate sodium  100 mg Oral BID  . gabapentin  800 mg Oral TID  . heparin  5,000 Units Subcutaneous Q8H  . insulin aspart  0-20 Units Subcutaneous TID WC  . insulin aspart  0-5 Units Subcutaneous QHS  . insulin aspart  6 Units Subcutaneous TID WC  . insulin glargine  20 Units Subcutaneous QHS  . losartan  50 mg Oral Daily  . mirabegron ER  50 mg Oral Daily  . mometasone-formoterol  2 puff Inhalation BID  . pantoprazole  40 mg Oral Daily  . torsemide  40 mg Oral Daily   Continuous Infusions: . sodium chloride 50 mL/hr at 10/15/19 0500  . sodium chloride    . lactated ringers 10 mL/hr at 10/14/19 1224  . magnesium sulfate bolus IVPB     PRN Meds: sodium chloride, albuterol, alum & mag hydroxide-simeth, guaiFENesin-dextromethorphan, hydrALAZINE, HYDROmorphone (DILAUDID) injection, labetalol, magnesium sulfate bolus IVPB, metoprolol tartrate, ondansetron, oxyCODONE-acetaminophen, phenol, potassium chloride, potassium chloride   Vital Signs    Vitals:   10/15/19 0115 10/15/19 0427 10/15/19 0440 10/15/19 0800  BP:   115/80 121/73  Pulse: (!) 103  (!) 107   Resp: 20  20 18   Temp:   98.9 F (37.2 C) 100 F (37.8 C)  TempSrc:   Oral Oral  SpO2: 96%  95% 94%  Weight:  95.8 kg    Height:        Intake/Output Summary (Last 24 hours) at 10/15/2019 0835 Last data filed at 10/15/2019 0500 Gross per 24 hour  Intake 2237.01 ml  Output 1350 ml  Net 887.01 ml   Last 3 Weights 10/15/2019 10/14/2019 10/14/2019  Weight  (lbs) 211 lb 3.2 oz 203 lb 4.2 oz 206 lb 9.1 oz  Weight (kg) 95.8 kg 92.2 kg 93.7 kg      Telemetry    Sinus tach, rates 100-110 - Personally Reviewed  ECG    No new - Personally Reviewed  Physical Exam   GEN: No acute distress.   Neck: No JVD Cardiac: RRR, no murmurs, rubs, or gallops.  Respiratory: Clear to auscultation bilaterally. GI: Soft, nontender, non-distended  MS: No edema; No deformity. Neuro:  Nonfocal  Psych: Normal affect   Labs    High Sensitivity Troponin:  No results for input(s): TROPONINIHS in the last 720 hours.    Chemistry Recent Labs  Lab 10/13/19 0914 10/14/19 1630 10/15/19 0400  NA 137 139 140  K 4.0 5.0 4.8  CL 101 105 104  CO2 27 24 27   GLUCOSE 168* 199* 165*  BUN 17 16 16   CREATININE 1.43* 1.29* 1.31*  CALCIUM 8.6* 8.6* 8.5*  GFRNONAA 40* 46* 45*  GFRAA 47* 53* 52*  ANIONGAP 9 10 9      Hematology Recent Labs  Lab 10/13/19 0914 10/14/19 1630 10/15/19 0400  WBC 7.2 14.5* 10.0  RBC 4.68 4.32 4.11  HGB 12.5 11.7* 11.0*  HCT 40.5 38.1 35.4*  MCV 86.5 88.2 86.1  MCH 26.7  27.1 26.8  MCHC 30.9 30.7 31.1  RDW 17.2* 17.2* 17.2*  PLT 448* 396 373    BNP Recent Labs  Lab 10/09/19 1715  BNP 488.5*     DDimer No results for input(s): DDIMER in the last 168 hours.   Radiology    ECHOCARDIOGRAM LIMITED  Result Date: 10/13/2019    ECHOCARDIOGRAM LIMITED REPORT   Patient Name:   CHANLEY MCENERY [facility] Date of Exam: 10/13/2019 Medical Rec #:  [id number]            Height:       65.0 in Accession #:    [id number]           Weight:       202.8 lb Date of Birth:  [birth year]             BSA:          1.990 m Patient Age:    59 years             BP:           132/87 mmHg Patient Gender: F                    HR:           93 bpm. Exam Location:  Inpatient Procedure: Limited Echo and Limited Color Doppler Indications:    CAD of Native Vessel 414.01 / I25.10  History:        Patient has prior history of Echocardiogram examinations, most                  recent 09/22/2019. CHF, PAD and COPD; Risk Factors:Hypertension,                 Diabetes, Dyslipidemia, Sleep Apnea and Current Smoker. Cancer.                 Alcohol abuse.  Sonographer:    Jonelle Sidle Dance Referring Phys: [id number] PAULA V ROSS IMPRESSIONS  1. Left ventricular ejection fraction, by estimation, is 40 to 45%. The left ventricle has mildly decreased function. The left ventricle demonstrates global hypokinesis.  2. Right ventricular systolic function is mildly reduced. The right ventricular size is normal.  3. The mitral valve is normal in structure. No evidence of mitral valve regurgitation.  4. The aortic valve is tricuspid. Aortic valve regurgitation is not visualized. No aortic stenosis is present.  5. The inferior vena cava is normal in size with <50% respiratory variability, suggesting right atrial pressure of 8 mmHg. FINDINGS  Left Ventricle: Left ventricular ejection fraction, by estimation, is 40 to 45%. The left ventricle has mildly decreased function. The left ventricle demonstrates global hypokinesis. Right Ventricle: The right ventricular size is normal. Right ventricular systolic function is mildly reduced. Pericardium: There is no evidence of pericardial effusion. Mitral Valve: The mitral valve is normal in structure. Tricuspid Valve: The tricuspid valve is normal in structure. Tricuspid valve regurgitation is trivial. Aortic Valve: The aortic valve is tricuspid. Aortic valve regurgitation is not visualized. No aortic stenosis is present. Pulmonic Valve: The pulmonic valve was not well visualized. Pulmonic valve regurgitation is not visualized. Aorta: The aortic root and ascending aorta are structurally normal, with no evidence of dilitation. Venous: The inferior vena cava is normal in size with less than 50% respiratory variability, suggesting right atrial pressure of 8 mmHg.  LEFT VENTRICLE PLAX 2D LVIDd:         4.90 cm LVIDs:  3.20 cm LV PW:         1.00 cm LV IVS:         1.00 cm LVOT diam:     1.90 cm LVOT Area:     2.84 cm  IVC IVC diam: 1.60 cm LEFT ATRIUM         Index LA diam:    3.70 cm 1.86 cm/m   AORTA Ao Root diam: 2.60 cm Ao Asc diam:  2.90 cm  SHUNTS Systemic Diam: 1.90 cm Oswaldo Milian MD Electronically signed by Oswaldo Milian MD Signature Date/Time: 10/13/2019/7:15:13 PM    Final     Cardiac Studies   Limited echo 10/13/19 1. Left ventricular ejection fraction, by estimation, is 40 to 45%. The  left ventricle has mildly decreased function. The left ventricle  demonstrates global hypokinesis.  2. Right ventricular systolic function is mildly reduced. The right  ventricular size is normal.  3. The mitral valve is normal in structure. No evidence of mitral valve  regurgitation.  4. The aortic valve is tricuspid. Aortic valve regurgitation is not  visualized. No aortic stenosis is present.  5. The inferior vena cava is normal in size with <50% respiratory  variability, suggesting right atrial pressure of 8 mmHg.   Lexiscan myoview 10/12/19  There was no ST segment deviation noted during stress.  Perfusion scan with evidence of large infarct and/or extensive soft tissue attenuation (extensive breast). No ischemia LVEF 31% Review of the raw images shows breast shadow overall but small part of myocardium  LVEF 31%  High risk scan due to possible scar and depressed LVEF  REcomm limited echo to reconfirm LVEF  Echocardiogram 09/22/19: 1. Left ventricular ejection fraction, by estimation, is 50 to 55%. The  left ventricle has low normal function. The left ventricle has no regional  wall motion abnormalities. Left ventricular diastolic parameters are  consistent with Grade II diastolic  dysfunction (pseudonormalization).  2. Right ventricular systolic function is normal. The right ventricular  size is normal.  3. The mitral valve is normal in structure. Trivial mitral valve  regurgitation. No evidence of mitral stenosis.   4. The aortic valve is normal in structure. Aortic valve regurgitation is  not visualized. No aortic stenosis is present.  Left heart catheterization 2016: CONCLUSIONS:     CORONARY STATUS: Mild non-obstructive ASCAD      LV FUNCTION:        Ejection Fraction:  55%        Wall Motion:     Normal       OTHER: LCP for low grade NSTEMI in setting of cocaine+ UDS. 50% mRCA,  minimal CAD in LCX, LAD. Normal LVEF at 55%. EBL <7mL. No specimens. 50F  right radial. TR band for hemostasis.    Patient Profile     59 y.o. female with a PMH ofnon-obstructive CAD,chronic diastolic CHF, HTN, HLD, PAD s/p L SFA stenting 09/26/19, DM type 2, Gout, COPD, tobacco and ETOH abuse, breast cancer s/p right mastectomy, and OSA on BiPAP,who is being seen for preoperative assessmentat the request of Dr. Donzetta Matters  Assessment & Plan    Preoperative cardiovascular risk assessment for left fem to BK pop bypass - post-op day 1 - Lexiscan this admission showed fixed defect, large across anterior/anteroseptal/anterolateral regions, scar vs artifact. EF reported as reduced but was previously normal a month ago.  - Limited echo showed mildly reduced LV function at 40-45% - Patient is asymptomatic from a cardiac perspective but will likely need  a cath given reduced EF and abnormal stress test. However this is non urgent and will be discussed as OP - Other comorbidities include HTN, HLD, DM2, chronic diastolic CHF, tobacco use  Nonobstructive CAD - stress test as above - cath in 2016 showed minimal RCA disease - Allergy to aspirin, on plavix and statin - plan as above  Cardiomyopathy, EF 40-45% - on dapaglifozin with CAD and DM2 - Dilt stopped given mildly reduced EF - continue losartan, torsemide - start BB with stable pressures and increased HR  HTN - BP this AM 121/73, can add BB - continue losartan  For questions or updates, please contact [facility] Please  consult [email or web address] for contact info under        Signed, Renesmee Raine Ninfa Meeker, PA-C  10/15/2019, 8:35 AM

## 2019-10-15 NOTE — Progress Notes (Signed)
Mobility Specialist: Progress Note    10/15/19 1732  Mobility  Activity Dangled on edge of bed  Level of Assistance Maximum assist, patient does 25-49%  Assistive Device None  Mobility Response Tolerated poorly  Mobility performed by Mobility specialist  $Mobility charge 1 Mobility   Pre-Mobility: 106 HR, 105/70 BP, 100% SpO2 Post-Mobility: 108 HR  Initiated transfer training with pt. Pt unable to complete transfer from supine to EOB. Pt dangled right leg and had to get back in the bed b/c of c/o pain in her L thigh.   [facility] Gabryelle Whitmoyer Mobility Specialist

## 2019-10-16 LAB — CBC
HCT: 36.6 % (ref 36.0–46.0)
Hemoglobin: 11.2 g/dL — ABNORMAL LOW (ref 12.0–15.0)
MCH: 26.4 pg (ref 26.0–34.0)
MCHC: 30.6 g/dL (ref 30.0–36.0)
MCV: 86.1 fL (ref 80.0–100.0)
Platelets: 349 10*3/uL (ref 150–400)
RBC: 4.25 MIL/uL (ref 3.87–5.11)
RDW: 17.4 % — ABNORMAL HIGH (ref 11.5–15.5)
WBC: 11.4 10*3/uL — ABNORMAL HIGH (ref 4.0–10.5)
nRBC: 0 % (ref 0.0–0.2)

## 2019-10-16 LAB — BASIC METABOLIC PANEL
Anion gap: 11 (ref 5–15)
BUN: 17 mg/dL (ref 6–20)
CO2: 27 mmol/L (ref 22–32)
Calcium: 8.7 mg/dL — ABNORMAL LOW (ref 8.9–10.3)
Chloride: 103 mmol/L (ref 98–111)
Creatinine, Ser: 1.48 mg/dL — ABNORMAL HIGH (ref 0.44–1.00)
GFR calc Af Amer: 45 mL/min — ABNORMAL LOW (ref >60)
GFR calc non Af Amer: 39 mL/min — ABNORMAL LOW (ref >60)
Glucose, Bld: 135 mg/dL — ABNORMAL HIGH (ref 70–99)
Potassium: 4.1 mmol/L (ref 3.5–5.1)
Sodium: 141 mmol/L (ref 135–145)

## 2019-10-16 LAB — GLUCOSE, CAPILLARY
Glucose-Capillary: 137 mg/dL — ABNORMAL HIGH (ref 70–99)
Glucose-Capillary: 94 mg/dL (ref 70–99)
Glucose-Capillary: 127 mg/dL — ABNORMAL HIGH (ref 70–99)
Glucose-Capillary: 121 mg/dL — ABNORMAL HIGH (ref 70–99)

## 2019-10-16 NOTE — Progress Notes (Signed)
Mobility Specialist: Progress Note    10/16/19 1721  Mobility  Activity Refused mobility  Mobility performed by Mobility specialist    [facility] Day Mobility Specialist

## 2019-10-16 NOTE — Care Management Important Message (Signed)
Important Message  Patient Details  Name: Morgan Beasley MRN: [id number] Date of Birth: 15-Apr-1961   Medicare Important Message Given:  Yes     Shelda Altes 10/16/2019, 9:24 AM

## 2019-10-16 NOTE — Progress Notes (Addendum)
  Progress Note    10/16/2019 7:43 AM 2 Days Post-Op  Subjective: complaining of a lot of Left leg pain   Vitals:   10/16/19 0309 10/16/19 0311  BP:  117/73  Pulse: (!) 104 (!) 106  Resp: 19 20  Temp:  98.6 F (37 C)  SpO2: 96% 97%   Physical Exam: Cardiac: tachycardic, regular Lungs:  Non labored Incisions:  Left lower extremity incisions clean,dry and intact. Tender but without hematoma. Extremities:  Left foot with brisk doppler DP. Motor and sensation intact. Left foot wounds are dry Abdomen: obese, soft, non tender Neurologic: alert and oriented  CBC    Component Value Date/Time   WBC 11.4 (H) 10/16/2019 0247   RBC 4.25 10/16/2019 0247   HGB 11.2 (L) 10/16/2019 0247   HCT 36.6 10/16/2019 0247   PLT 349 10/16/2019 0247   MCV 86.1 10/16/2019 0247   MCH 26.4 10/16/2019 0247   MCHC 30.6 10/16/2019 0247   RDW 17.4 (H) 10/16/2019 0247   LYMPHSABS 2.8 10/09/2019 1710   MONOABS 0.8 10/09/2019 1710   EOSABS 0.2 10/09/2019 1710   BASOSABS 0.0 10/09/2019 1710    BMET    Component Value Date/Time   NA 141 10/16/2019 0247   K 4.1 10/16/2019 0247   CL 103 10/16/2019 0247   CO2 27 10/16/2019 0247   GLUCOSE 135 (H) 10/16/2019 0247   BUN 17 10/16/2019 0247   CREATININE 1.48 (H) 10/16/2019 0247   CALCIUM 8.7 (L) 10/16/2019 0247   GFRNONAA 39 (L) 10/16/2019 0247   GFRAA 45 (L) 10/16/2019 0247    INR No results found for: INR   Intake/Output Summary (Last 24 hours) at 10/16/2019 0743 Last data filed at 10/16/2019 0344 Gross per 24 hour  Intake 240 ml  Output 800 ml  Net -560 ml     Assessment/Plan:  59 y.o. female is s/p left femoral to below knee popliteal bypass 2 Days Post-Op. Left leg well perfused and warm. Dry gauze to left groin and upper thigh incisions to wick moisture. Doppler DP signals left foot. Wounds dry. Pain control. Continue to work on mobilization today  DVT prophylaxis:  Sq. Heparin   Karoline Caldwell, PA-C [facility] [phone] 10/16/2019 7:43 AM   I have independently interviewed and examined patient and agree with PA assessment and plan above.  I have her encouraged her mobility with the help of our mobility specialist.  Erlene Quan C. Donzetta Matters, MD [facility] [place] Office: [phone] Pager: [phone]

## 2019-10-17 LAB — BPAM RBC
Blood Product Expiration Date: 202107022359
Blood Product Expiration Date: 202107032359
Unit Type and Rh: 7300
Unit Type and Rh: 7300

## 2019-10-17 LAB — GLUCOSE, CAPILLARY
Glucose-Capillary: 118 mg/dL — ABNORMAL HIGH (ref 70–99)
Glucose-Capillary: 193 mg/dL — ABNORMAL HIGH (ref 70–99)
Glucose-Capillary: 205 mg/dL — ABNORMAL HIGH (ref 70–99)
Glucose-Capillary: 170 mg/dL — ABNORMAL HIGH (ref 70–99)

## 2019-10-17 LAB — TYPE AND SCREEN
ABO/RH(D): B POS
Antibody Screen: NEGATIVE
Unit division: 0
Unit division: 0

## 2019-10-17 LAB — BASIC METABOLIC PANEL
Anion gap: 9 (ref 5–15)
BUN: 18 mg/dL (ref 6–20)
CO2: 27 mmol/L (ref 22–32)
Calcium: 8.1 mg/dL — ABNORMAL LOW (ref 8.9–10.3)
Chloride: 104 mmol/L (ref 98–111)
Creatinine, Ser: 1.42 mg/dL — ABNORMAL HIGH (ref 0.44–1.00)
GFR calc Af Amer: 47 mL/min — ABNORMAL LOW (ref >60)
GFR calc non Af Amer: 41 mL/min — ABNORMAL LOW (ref >60)
Glucose, Bld: 120 mg/dL — ABNORMAL HIGH (ref 70–99)
Potassium: 3.3 mmol/L — ABNORMAL LOW (ref 3.5–5.1)
Sodium: 140 mmol/L (ref 135–145)

## 2019-10-17 LAB — CBC
HCT: 32 % — ABNORMAL LOW (ref 36.0–46.0)
Hemoglobin: 9.9 g/dL — ABNORMAL LOW (ref 12.0–15.0)
MCH: 26.4 pg (ref 26.0–34.0)
MCHC: 30.9 g/dL (ref 30.0–36.0)
MCV: 85.3 fL (ref 80.0–100.0)
Platelets: 332 10*3/uL (ref 150–400)
RBC: 3.75 MIL/uL — ABNORMAL LOW (ref 3.87–5.11)
RDW: 17.3 % — ABNORMAL HIGH (ref 11.5–15.5)
WBC: 9.4 10*3/uL (ref 4.0–10.5)
nRBC: 0 % (ref 0.0–0.2)

## 2019-10-17 MED ORDER — POTASSIUM CHLORIDE CRYS ER 20 MEQ PO TBCR
40.0000 meq | EXTENDED_RELEASE_TABLET | Freq: Once | ORAL | Status: AC
  Administered 2019-10-17: 40 meq via ORAL
  Filled 2019-10-17: qty 2

## 2019-10-17 NOTE — Progress Notes (Signed)
Mobility Specialist: Progress Note    10/17/19 1633  Mobility  Activity Transferred to/from [facility]  Level of Assistance Maximum assist, patient does 25-49%  Assistive Device Front wheel walker  Mobility Response Tolerated poorly  Mobility performed by Mobility specialist  $Mobility charge 1 Mobility   Pre-Mobility: 97 HR, 115/71 BP  Pt was only willing to transfer to [facility].   [facility] Day Mobility Specialist

## 2019-10-17 NOTE — Progress Notes (Signed)
Patient has sleep apnea. Was told in report patient refuses CPAP . Applied O2 at 2 l/m n.c resp range from 20-24. No distress noted.

## 2019-10-17 NOTE — Progress Notes (Signed)
Physical Therapy Treatment Patient Details Name: Morgan Beasley MRN: [id number] DOB: 1961/01/02 Today's Date: 10/17/2019    History of Present Illness 59 year old female with history of left SFA stenting.  Stent subsequently occluded.  She now has critical limb ischemia with sloughing of skin on the left foot. Pt s/p left common femoral to below-knee popliteal artery bypass.    PT Comments    Pt was seen for mobility on RW, using HHA as well to transition from bed to chair and back.  Pt was settled in chair with alarm in place, was able to assist more with more practice to get to [facility] and then to recliner.  Pt will be seen for ongoing increased WB on LLE, to strengthen BLE's and to manage discomfort of disuse on LLE.   Follow Up Recommendations  SNF     Equipment Recommendations  None recommended by PT    Recommendations for Other Services       Precautions / Restrictions Precautions Precautions: Fall Precaution Comments: monitor O2 sats Restrictions Weight Bearing Restrictions: No    Mobility  Bed Mobility Overal bed mobility: Needs Assistance Bed Mobility: Supine to Sit     Supine to sit: Min assist     General bed mobility comments: min assist due to pt asking for inconsistent level of assist, had to resume helping to get to [facility]  Transfers Overall transfer level: Needs assistance Equipment used: Rolling walker (2 wheeled);1 person hand held assist Transfers: Sit to/from Stand Sit to Stand: Mod assist;Min assist Stand pivot transfers: Min assist       General transfer comment: pt was cued and reminded of sequence to get to [facility] and chair by nursing  Ambulation/Gait             General Gait Details: unable to do more than sidestep   Stairs             Wheelchair Mobility    Modified Rankin (Stroke Patients Only)       Balance Overall balance assessment: Needs assistance Sitting-balance support: Feet supported;Bilateral upper extremity  supported Sitting balance-Leahy Scale: Fair     Standing balance support: Bilateral upper extremity supported;During functional activity Standing balance-Leahy Scale: Poor Standing balance comment: cues for sequence of hand placement to chair                            Cognition Arousal/Alertness: Awake/alert Behavior During Therapy: Impulsive;Agitated Overall Cognitive Status: No family/caregiver present to determine baseline cognitive functioning Area of Impairment: Problem solving;Awareness;Safety/judgement;Following commands;Memory;Attention;Orientation                 Orientation Level: Situation Current Attention Level: Selective Memory: Decreased recall of precautions;Decreased short-term memory Following Commands: Follows one step commands inconsistently;Follows one step commands with increased time Safety/Judgement: Decreased awareness of safety;Decreased awareness of deficits Awareness: Intellectual Problem Solving: Slow processing;Difficulty sequencing;Requires verbal cues General Comments: pt is initially asking PT not to touch her at all to transfer, and reassured her that PT is required to assist since pt is not safely able to stand alone yet      Exercises      General Comments General comments (skin integrity, edema, etc.): pt can use walker and wgt bear on LLE      Pertinent Vitals/Pain Pain Assessment: Faces Pain Score: 8  Faces Pain Scale: Hurts whole lot Pain Location: L LE with movement Pain Descriptors / Indicators: Operative site guarding Pain Intervention(s): Limited  activity within patient's tolerance;Monitored during session;Premedicated before session;Repositioned    Home Living                      Prior Function            PT Goals (current goals can now be found in the care plan section) Acute Rehab PT Goals Patient Stated Goal: not to have pain on LLE Progress towards PT goals: Progressing toward goals     Frequency    Min 3X/week      PT Plan Current plan remains appropriate    Co-evaluation              AM-PAC PT "6 Clicks" Mobility   Outcome Measure  Help needed turning from your back to your side while in a flat bed without using bedrails?: A Little Help needed moving from lying on your back to sitting on the side of a flat bed without using bedrails?: A Little Help needed moving to and from a bed to a chair (including a wheelchair)?: A Lot Help needed standing up from a chair using your arms (e.g., wheelchair or bedside chair)?: A Lot Help needed to walk in hospital room?: Total Help needed climbing 3-5 steps with a railing? : Total 6 Click Score: 12    End of Session Equipment Utilized During Treatment: Gait belt;Oxygen Activity Tolerance: Patient limited by fatigue;Treatment limited secondary to medical complications (Comment);Patient limited by pain Patient left: in chair;with call bell/phone within reach;with chair alarm set Nurse Communication: Mobility status PT Visit Diagnosis: Unsteadiness on feet (R26.81);Muscle weakness (generalized) (M62.81);Pain Pain - Right/Left: Left Pain - part of body: Leg     Time: 1003-1041 PT Time Calculation (min) (ACUTE ONLY): 38 min  Charges:  $Therapeutic Activity: 23-37 mins $Neuromuscular Re-education: 8-22 mins                    Ramond Dial 10/17/2019, 4:56 PM  Mee Hives, PT MS Acute Rehab Dept. Number: [place]

## 2019-10-17 NOTE — Progress Notes (Addendum)
  Progress Note    10/17/2019 8:06 AM 3 Days Post-Op  Subjective:  Entire LLE is painful   Vitals:   10/17/19 0502 10/17/19 0503  BP:    Pulse: 93 92  Resp: (!) 0 20  Temp:    SpO2: (!) 86% 96%   Physical Exam: Lungs:  Non labored Incisions:  L groin and incisions of LLE c/d/i Extremities:  Brisk L DP by doppler; L PT soft by doppler Neurologic: A&O  CBC    Component Value Date/Time   WBC 9.4 10/17/2019 0329   RBC 3.75 (L) 10/17/2019 0329   HGB 9.9 (L) 10/17/2019 0329   HCT 32.0 (L) 10/17/2019 0329   PLT 332 10/17/2019 0329   MCV 85.3 10/17/2019 0329   MCH 26.4 10/17/2019 0329   MCHC 30.9 10/17/2019 0329   RDW 17.3 (H) 10/17/2019 0329   LYMPHSABS 2.8 10/09/2019 1710   MONOABS 0.8 10/09/2019 1710   EOSABS 0.2 10/09/2019 1710   BASOSABS 0.0 10/09/2019 1710    BMET    Component Value Date/Time   NA 140 10/17/2019 0329   K 3.3 (L) 10/17/2019 0329   CL 104 10/17/2019 0329   CO2 27 10/17/2019 0329   GLUCOSE 120 (H) 10/17/2019 0329   BUN 18 10/17/2019 0329   CREATININE 1.42 (H) 10/17/2019 0329   CALCIUM 8.1 (L) 10/17/2019 0329   GFRNONAA 41 (L) 10/17/2019 0329   GFRAA 47 (L) 10/17/2019 0329    INR No results found for: INR   Intake/Output Summary (Last 24 hours) at 10/17/2019 0806 Last data filed at 10/17/2019 0459 Gross per 24 hour  Intake 720 ml  Output 1150 ml  Net -430 ml     Assessment/Plan:  59 y.o. female is s/p L femoral to popliteal bypass 3 Days Post-Op   L foot perfused with DP and PT doppler signals Encouraged mobility with specialist [facility] consulted for SNF   Dagoberto Ligas, PA-C [facility] [phone] 10/17/2019 8:06 AM  I have independently interviewed and examined patient and agree with PA assessment and plan above.   Alexandros Ewan C. Donzetta Matters, MD [facility] [place] Office: [phone] Pager: [phone]

## 2019-10-18 LAB — BASIC METABOLIC PANEL
Anion gap: 9 (ref 5–15)
BUN: 17 mg/dL (ref 6–20)
CO2: 29 mmol/L (ref 22–32)
Calcium: 8.3 mg/dL — ABNORMAL LOW (ref 8.9–10.3)
Chloride: 102 mmol/L (ref 98–111)
Creatinine, Ser: 1.28 mg/dL — ABNORMAL HIGH (ref 0.44–1.00)
GFR calc Af Amer: 53 mL/min — ABNORMAL LOW (ref >60)
GFR calc non Af Amer: 46 mL/min — ABNORMAL LOW (ref >60)
Glucose, Bld: 123 mg/dL — ABNORMAL HIGH (ref 70–99)
Potassium: 3.6 mmol/L (ref 3.5–5.1)
Sodium: 140 mmol/L (ref 135–145)

## 2019-10-18 LAB — CBC
HCT: 33.4 % — ABNORMAL LOW (ref 36.0–46.0)
Hemoglobin: 10.1 g/dL — ABNORMAL LOW (ref 12.0–15.0)
MCH: 26.3 pg (ref 26.0–34.0)
MCHC: 30.2 g/dL (ref 30.0–36.0)
MCV: 87 fL (ref 80.0–100.0)
Platelets: 309 10*3/uL (ref 150–400)
RBC: 3.84 MIL/uL — ABNORMAL LOW (ref 3.87–5.11)
RDW: 17.4 % — ABNORMAL HIGH (ref 11.5–15.5)
WBC: 9.8 10*3/uL (ref 4.0–10.5)
nRBC: 0 % (ref 0.0–0.2)

## 2019-10-18 LAB — GLUCOSE, CAPILLARY
Glucose-Capillary: 113 mg/dL — ABNORMAL HIGH (ref 70–99)
Glucose-Capillary: 167 mg/dL — ABNORMAL HIGH (ref 70–99)
Glucose-Capillary: 151 mg/dL — ABNORMAL HIGH (ref 70–99)
Glucose-Capillary: 155 mg/dL — ABNORMAL HIGH (ref 70–99)

## 2019-10-18 NOTE — TOC Initial Note (Signed)
Transition of Care [facility]) - Initial/Assessment Note    Patient Details  Name: Morgan Beasley MRN: [id number] Date of Birth: 04-08-1961  Transition of Care [facility]) CM/SW Contact:    Bary Castilla, LCSW Phone Number: [phone] 10/18/2019, 3:20 PM  Clinical Narrative:                  CSW met with patient to discuss the recommendation of a SNF. Patient informed CSW that she wanted to return home with [facility] Patient stated that she has a nurse that comes in and she lives with her daughter Morgan Beasley. Patient stated that she feels that she will be able to get around enough to go home. At this time, patient is declining a SNF.  TOC team will continue to monitor for discharge planning needs.  Expected Discharge Plan: [name] Barriers to Discharge: Continued Medical Work up   Patient Goals and CMS Choice        Expected Discharge Plan and Services Expected Discharge Plan: [place]       Living arrangements for the past 2 months: Apartment                                      Prior Living Arrangements/Services Living arrangements for the past 2 months: Apartment Lives with:: Self, Adult Children Patient language and need for interpreter reviewed:: Yes Do you feel safe going back to the place where you live?: Yes      Need for Family Participation in Patient Care: Yes (Comment) Care giver support system in place?: Yes (comment) Current home services: DME, Homehealth aide    Activities of Daily Living Home Assistive Devices/Equipment: CBG Meter, Other (Comment) (rollator) ADL Screening (condition at time of admission) Patient's cognitive ability adequate to safely complete daily activities?: Yes Is the patient deaf or have difficulty hearing?: No Does the patient have difficulty seeing, even when wearing glasses/contacts?: No Does the patient have difficulty concentrating, remembering, or making decisions?: No Patient able to  express need for assistance with ADLs?: Yes Does the patient have difficulty dressing or bathing?: No Independently performs ADLs?: Yes (appropriate for developmental age) Does the patient have difficulty walking or climbing stairs?: Yes Weakness of Legs: Both Weakness of Arms/Hands: None  Permission Sought/Granted Permission sought to share information with : Family Supports Permission granted to share information with : Yes, Verbal Permission Granted  Share Information with NAME: Morgan Beasley  Permission granted to share info w AGENCY: [place] granted to share info w Relationship: Daughter     Emotional Assessment Appearance:: Appears stated age Attitude/Demeanor/Rapport: Engaged Affect (typically observed): Accepting, Adaptable Orientation: : Oriented to Self, Oriented to Place, Oriented to  Time, Oriented to Situation      Admission diagnosis:  PAD (peripheral artery disease) (HCC) [I73.9] Left leg cellulitis [V78.469] Ischemia of left lower extremity [I99.8] Patient Active Problem List   Diagnosis Date Noted  . Ischemia of left lower extremity 10/09/2019  . PVD (peripheral vascular disease) ([place])   . Diabetic peripheral neuropathy associated with type 2 diabetes mellitus ([place])   . Acute on chronic diastolic congestive heart failure ([place])   . Lower extremity edema   . Dyspnea 09/21/2019  . Bronchitis 02/25/2019  . Diabetes mellitus ([place]) 02/25/2019  . Peripheral neuropathy 02/25/2019  . COPD (chronic obstructive pulmonary disease) ([place]) 02/25/2019  . Malignant neoplasm of upper-outer  quadrant of right breast in female, estrogen receptor positive ([place]) 01/08/2019   PCP:  Sonia Side., FNP Pharmacy:   [place], [place] - [address] [place] [address] Phone: [phone] Fax: [phone]     Social Determinants of Health (SDOH) Interventions    Readmission Risk Interventions No flowsheet data  found.

## 2019-10-18 NOTE — Progress Notes (Signed)
Mobility Specialist: Progress Note    10/18/19 1445  Mobility  Activity Ambulated in room  Level of Assistance Minimal assist, patient does 75% or more  Assistive Device Front wheel walker  Distance Ambulated (ft) 15 ft  Mobility Response Tolerated fair  Mobility performed by Mobility specialist  $Mobility charge 1 Mobility   Pre-Mobility: 91 HR, 122/78 BP, 95% SpO2 Post-Mobility: 95 HR, 126/80 BP, 91% SpO2  Pt walked halfway to the door and went back to the bed.   [facility] Avika Carbine Mobility Specialist

## 2019-10-18 NOTE — Progress Notes (Addendum)
  Progress Note    10/18/2019 7:39 AM 4 Days Post-Op  Subjective:  Pain LLE   Vitals:   10/17/19 2352 10/18/19 0414  BP: 104/66 124/79  Pulse: 88 92  Resp: (!) 21 (!) 24  Temp: 98.6 F (37 C) 99 F (37.2 C)  SpO2: 95% 100%   Physical Exam: Lungs:  Non labored Incisions:  L groin and incisions of LLE c/d/i Extremities:  L DP brisk by doppler; L foot wounds dry, stable Neurologic: A&O  CBC    Component Value Date/Time   WBC 9.8 10/18/2019 0340   RBC 3.84 (L) 10/18/2019 0340   HGB 10.1 (L) 10/18/2019 0340   HCT 33.4 (L) 10/18/2019 0340   PLT 309 10/18/2019 0340   MCV 87.0 10/18/2019 0340   MCH 26.3 10/18/2019 0340   MCHC 30.2 10/18/2019 0340   RDW 17.4 (H) 10/18/2019 0340   LYMPHSABS 2.8 10/09/2019 1710   MONOABS 0.8 10/09/2019 1710   EOSABS 0.2 10/09/2019 1710   BASOSABS 0.0 10/09/2019 1710    BMET    Component Value Date/Time   NA 140 10/18/2019 0340   K 3.6 10/18/2019 0340   CL 102 10/18/2019 0340   CO2 29 10/18/2019 0340   GLUCOSE 123 (H) 10/18/2019 0340   BUN 17 10/18/2019 0340   CREATININE 1.28 (H) 10/18/2019 0340   CALCIUM 8.3 (L) 10/18/2019 0340   GFRNONAA 46 (L) 10/18/2019 0340   GFRAA 53 (L) 10/18/2019 0340    INR No results found for: INR   Intake/Output Summary (Last 24 hours) at 10/18/2019 0739 Last data filed at 10/18/2019 0612 Gross per 24 hour  Intake 840 ml  Output 800 ml  Net 40 ml     Assessment/Plan:  59 y.o. female is s/p L fem pop 4 Days Post-Op   L foot perfused with DP by doppler Encouraged OOB Waiting on SNF   Morgan Ligas, PA-C [facility] [phone] 10/18/2019 7:39 AM  I have independently interviewed and examined patient and agree with PA assessment and plan above.   Morgan Beasley C. Donzetta Matters, MD [facility] [place] Office: [phone] Pager: [phone]

## 2019-10-19 LAB — BASIC METABOLIC PANEL
Anion gap: 10 (ref 5–15)
BUN: 14 mg/dL (ref 6–20)
CO2: 26 mmol/L (ref 22–32)
Calcium: 8 mg/dL — ABNORMAL LOW (ref 8.9–10.3)
Chloride: 103 mmol/L (ref 98–111)
Creatinine, Ser: 1.19 mg/dL — ABNORMAL HIGH (ref 0.44–1.00)
GFR calc Af Amer: 58 mL/min — ABNORMAL LOW (ref >60)
GFR calc non Af Amer: 50 mL/min — ABNORMAL LOW (ref >60)
Glucose, Bld: 211 mg/dL — ABNORMAL HIGH (ref 70–99)
Potassium: 3.1 mmol/L — ABNORMAL LOW (ref 3.5–5.1)
Sodium: 139 mmol/L (ref 135–145)

## 2019-10-19 LAB — CBC
HCT: 34.3 % — ABNORMAL LOW (ref 36.0–46.0)
Hemoglobin: 10.7 g/dL — ABNORMAL LOW (ref 12.0–15.0)
MCH: 26.6 pg (ref 26.0–34.0)
MCHC: 31.2 g/dL (ref 30.0–36.0)
MCV: 85.3 fL (ref 80.0–100.0)
Platelets: 354 10*3/uL (ref 150–400)
RBC: 4.02 MIL/uL (ref 3.87–5.11)
RDW: 17.1 % — ABNORMAL HIGH (ref 11.5–15.5)
WBC: 7.7 10*3/uL (ref 4.0–10.5)
nRBC: 0 % (ref 0.0–0.2)

## 2019-10-19 LAB — GLUCOSE, CAPILLARY
Glucose-Capillary: 177 mg/dL — ABNORMAL HIGH (ref 70–99)
Glucose-Capillary: 91 mg/dL (ref 70–99)
Glucose-Capillary: 157 mg/dL — ABNORMAL HIGH (ref 70–99)
Glucose-Capillary: 101 mg/dL — ABNORMAL HIGH (ref 70–99)

## 2019-10-19 NOTE — Progress Notes (Addendum)
  Progress Note    10/19/2019 7:57 AM 5 Days Post-Op  Subjective:  Patient believes LLE is improving   Vitals:   10/19/19 0045 10/19/19 0430  BP: (!) 109/57 117/66  Pulse: 94 97  Resp: 14 18  Temp: 97.6 F (36.4 C) 98.2 F (36.8 C)  SpO2: 96% 96%   Physical Exam Lungs:  Non labored Incisions:  LLE incisions c/d/i Extremities:  Brisk L DP by doppler Neurologic: A&O  CBC    Component Value Date/Time   WBC 7.7 10/19/2019 0443   RBC 4.02 10/19/2019 0443   HGB 10.7 (L) 10/19/2019 0443   HCT 34.3 (L) 10/19/2019 0443   PLT 354 10/19/2019 0443   MCV 85.3 10/19/2019 0443   MCH 26.6 10/19/2019 0443   MCHC 31.2 10/19/2019 0443   RDW 17.1 (H) 10/19/2019 0443   LYMPHSABS 2.8 10/09/2019 1710   MONOABS 0.8 10/09/2019 1710   EOSABS 0.2 10/09/2019 1710   BASOSABS 0.0 10/09/2019 1710    BMET    Component Value Date/Time   NA 139 10/19/2019 0443   K 3.1 (L) 10/19/2019 0443   CL 103 10/19/2019 0443   CO2 26 10/19/2019 0443   GLUCOSE 211 (H) 10/19/2019 0443   BUN 14 10/19/2019 0443   CREATININE 1.19 (H) 10/19/2019 0443   CALCIUM 8.0 (L) 10/19/2019 0443   GFRNONAA 50 (L) 10/19/2019 0443   GFRAA 58 (L) 10/19/2019 0443    INR No results found for: INR   Intake/Output Summary (Last 24 hours) at 10/19/2019 0757 Last data filed at 10/19/2019 0433 Gross per 24 hour  Intake 2694.33 ml  Output 1750 ml  Net 944.33 ml     Assessment/Plan:  59 y.o. female is s/p L femoral to popliteal bypass with vein 5 Days Post-Op   L foot perfused with brisk DP signal by doppler Continue to mobilize with therapy and nursing staff Patient refusing SNF but not ready for d/c home with [facility] Hopeful for discharge early this week   Dagoberto Ligas, PA-C [facility] [phone] 10/19/2019 7:57 AM  I have independently interviewed and examined patient and agree with PA assessment and plan above.   Maeleigh Buschman C. Donzetta Matters, MD [facility] [place] Office:  [phone] Pager: [phone]

## 2019-10-19 NOTE — Progress Notes (Signed)
Mobility Specialist: Progress Note    10/19/19 1436  Mobility  Activity Ambulated in room  Level of Assistance Minimal assist, patient does 75% or more  Assistive Device Front wheel walker  Distance Ambulated (ft) 40 ft  Mobility Response Tolerated fair  Mobility performed by Mobility specialist  Bed Position Chair  $Mobility charge 1 Mobility   Pre-Mobility: 96 HR Post-Mobility: 101 HR  Pt walked to the door and back to the chair w/ no c/o of pain.   [facility] Alyshia Kernan Mobility Specialist

## 2019-10-20 LAB — GLUCOSE, CAPILLARY
Glucose-Capillary: 103 mg/dL — ABNORMAL HIGH (ref 70–99)
Glucose-Capillary: 116 mg/dL — ABNORMAL HIGH (ref 70–99)
Glucose-Capillary: 67 mg/dL — ABNORMAL LOW (ref 70–99)
Glucose-Capillary: 131 mg/dL — ABNORMAL HIGH (ref 70–99)
Glucose-Capillary: 139 mg/dL — ABNORMAL HIGH (ref 70–99)
Glucose-Capillary: 179 mg/dL — ABNORMAL HIGH (ref 70–99)

## 2019-10-20 NOTE — Progress Notes (Addendum)
Hypoglycemic Event  CBG: 67  Treatment: Coke, Graham Crackers and peanut butter  Symptoms: None  Follow-up CBG: Time: BG Result:131  Possible Reasons for Event: poor intake  Comments/MD notified:per policy    Jeanne Ivan

## 2019-10-20 NOTE — Progress Notes (Addendum)
  Progress Note    10/20/2019 7:23 AM 6 Days Post-Op  Subjective: No complaints this morning   Vitals:   10/20/19 0547 10/20/19 0619  BP: 126/72   Pulse: 94 94  Resp: (!) 35 20  Temp: 98.3 F (36.8 C)   SpO2: 98% 100%   Physical Exam: Lungs:  Non labored Extremities:  L DP signal by doppler; L foot wounds dry, stable Abdomen:  soft Neurologic: A&O  CBC    Component Value Date/Time   WBC 7.7 10/19/2019 0443   RBC 4.02 10/19/2019 0443   HGB 10.7 (L) 10/19/2019 0443   HCT 34.3 (L) 10/19/2019 0443   PLT 354 10/19/2019 0443   MCV 85.3 10/19/2019 0443   MCH 26.6 10/19/2019 0443   MCHC 31.2 10/19/2019 0443   RDW 17.1 (H) 10/19/2019 0443   LYMPHSABS 2.8 10/09/2019 1710   MONOABS 0.8 10/09/2019 1710   EOSABS 0.2 10/09/2019 1710   BASOSABS 0.0 10/09/2019 1710    BMET    Component Value Date/Time   NA 139 10/19/2019 0443   K 3.1 (L) 10/19/2019 0443   CL 103 10/19/2019 0443   CO2 26 10/19/2019 0443   GLUCOSE 211 (H) 10/19/2019 0443   BUN 14 10/19/2019 0443   CREATININE 1.19 (H) 10/19/2019 0443   CALCIUM 8.0 (L) 10/19/2019 0443   GFRNONAA 50 (L) 10/19/2019 0443   GFRAA 58 (L) 10/19/2019 0443    INR No results found for: INR   Intake/Output Summary (Last 24 hours) at 10/20/2019 0723 Last data filed at 10/19/2019 1300 Gross per 24 hour  Intake 360 ml  Output --  Net 360 ml     Assessment/Plan:  59 y.o. female is s/p L fem pop with vein 6 Days Post-Op   L foot well perfused; wounds L foot improving Encouraged OOB 3 in 1 commode ordered for home D/c home with [facility] in the next few days   Dagoberto Ligas, PA-C [facility] [phone] 10/20/2019 7:23 AM  I have independently interviewed and examined patient and agree with PA assessment and plan above.  Disposition will be home when she is more mobile.  Billiejean Schimek C. Donzetta Matters, MD [facility] [place] Office: [phone] Pager: [phone]

## 2019-10-20 NOTE — Progress Notes (Signed)
Mobility Specialist - Progress Note   10/20/19 1454  Mobility  Activity Ambulated in hall  Level of Assistance Modified independent, requires aide device or extra time  Assistive Device Front wheel walker  Distance Ambulated (ft) 80 ft  Mobility Response Tolerated well  Mobility performed by Mobility specialist  $Mobility charge 1 Mobility    Pre-mobility: 91 HR, 95% SpO2 During mobility: 103 HR, 98% SpO2 Post-mobility: 101 HR, 98%  SPO2  Pt ambulated on 2 L/min of O2. She was left in her chair with the chair alarm on afterwards.  Pricilla Handler Mobility Specialist Mobility Specialist Phone: [phone]

## 2019-10-21 LAB — GLUCOSE, CAPILLARY: Glucose-Capillary: 184 mg/dL — ABNORMAL HIGH (ref 70–99)

## 2019-10-21 MED ORDER — HYDROCODONE-ACETAMINOPHEN 5-325 MG PO TABS: 1.0000 | ORAL_TABLET | ORAL | 0 refills | Status: DC | PRN

## 2019-10-21 MED ORDER — METOPROLOL SUCCINATE ER 25 MG PO TB24: 25.0000 mg | ORAL_TABLET | Freq: Every day | ORAL | 0 refills | Status: DC

## 2019-10-21 NOTE — Progress Notes (Addendum)
  Progress Note    10/21/2019 7:38 AM 7 Days Post-Op  Subjective:  No complaints this morning. Ready to go home   Vitals:   10/21/19 0029 10/21/19 0500  BP: 114/76 116/80  Pulse: 89 81  Resp: 12 18  Temp: 99 F (37.2 C) 97.6 F (36.4 C)  SpO2: 98% 99%   Physical Exam: Cardiac:  regular Lungs:  Non labored Incisions:  Clean, dry and intact. Left foot Doppler DP. Left foot wounds stable and dry Extremities:  Well perfused and warm Neurologic: alert and oriented  CBC    Component Value Date/Time   WBC 7.7 10/19/2019 0443   RBC 4.02 10/19/2019 0443   HGB 10.7 (L) 10/19/2019 0443   HCT 34.3 (L) 10/19/2019 0443   PLT 354 10/19/2019 0443   MCV 85.3 10/19/2019 0443   MCH 26.6 10/19/2019 0443   MCHC 31.2 10/19/2019 0443   RDW 17.1 (H) 10/19/2019 0443   LYMPHSABS 2.8 10/09/2019 1710   MONOABS 0.8 10/09/2019 1710   EOSABS 0.2 10/09/2019 1710   BASOSABS 0.0 10/09/2019 1710    BMET    Component Value Date/Time   NA 139 10/19/2019 0443   K 3.1 (L) 10/19/2019 0443   CL 103 10/19/2019 0443   CO2 26 10/19/2019 0443   GLUCOSE 211 (H) 10/19/2019 0443   BUN 14 10/19/2019 0443   CREATININE 1.19 (H) 10/19/2019 0443   CALCIUM 8.0 (L) 10/19/2019 0443   GFRNONAA 50 (L) 10/19/2019 0443   GFRAA 58 (L) 10/19/2019 0443    INR No results found for: INR   Intake/Output Summary (Last 24 hours) at 10/21/2019 0738 Last data filed at 10/20/2019 2300 Gross per 24 hour  Intake 680 ml  Output 1900 ml  Net -1220 ml     Assessment/Plan:  59 y.o. female is s/p LLE fem pop vein bypass 7 Days Post-Op. Left leg well perfused with Doppler DP. Wounds improving on left foot. Ambulated in hall well over past couple days. 3 in 1 commode order was placed yesterday. She will continue her Plavix and Atorvastatin. She will discharge home today with home health   DVT prophylaxis:  Sq Heparin   Marval Regal [facility] [phone] 10/21/2019 7:38 AM    I have  interviewed and examined patient with PA and agree with assessment and plan above.   Azriella Mattia C. Donzetta Matters, MD [facility] [place] Office: [phone] Pager: [phone]

## 2019-10-21 NOTE — Discharge Instructions (Signed)
 [facility] [place]  Discharge instructions  Lower Extremity Bypass Surgery  Please refer to the following instruction for your post-procedure care. Your surgeon or physician assistant will discuss any changes with you.  Activity  You are encouraged to walk as much as you can. You can slowly return to normal activities during the month after your surgery. Avoid strenuous activity and heavy lifting until your doctor tells you it's OK. Avoid activities such as vacuuming or swinging a golf club. Do not drive until your doctor give the OK and you are no longer taking prescription pain medications. It is also normal to have difficulty with sleep habits, eating and bowel movement after surgery. These will go away with time.  Bathing/Showering  Shower daily after you go home. Do not soak in a bathtub, hot tub, or swim until the incision heals completely.  Incision Care  Clean your incision with mild soap and water. Shower every day. Pat the area dry with a clean towel. You do not need a bandage unless otherwise instructed. Do not apply any ointments or creams to your incision. If you have open wounds you will be instructed how to care for them or a visiting nurse may be arranged for you. If you have staples or sutures along your incision they will be removed at your post-op appointment. You may have skin glue on your incision. Do not peel it off. It will come off on its own in about one week.  Wash the groin wound with soap and water daily and pat dry. (No tub bath-only shower)  Then put a dry gauze or washcloth in the groin to keep this area dry to help prevent wound infection.  Do this daily and as needed.  Do not use Vaseline or neosporin on your incisions.  Only use soap and water on your incisions and then protect and keep dry.  Diet  Resume your normal diet. There are no special food restrictions following this procedure. A low fat/ low cholesterol diet is  recommended for all patients with vascular disease. In order to heal from your surgery, it is CRITICAL to get adequate nutrition. Your body requires vitamins, minerals, and protein. Vegetables are the best source of vitamins and minerals. Vegetables also provide the perfect balance of protein. Processed food has little nutritional value, so try to avoid this.  Medications  Resume taking all your medications unless your doctor or physician assistant tells you not to. If your incision is causing pain, you may take over-the-counter pain relievers such as acetaminophen (Tylenol). If you were prescribed a stronger pain medication, please aware these medication can cause nausea and constipation. Prevent nausea by taking the medication with a snack or meal. Avoid constipation by drinking plenty of fluids and eating foods with high amount of fiber, such as fruits, vegetables, and grains. Take Colace 100 mg (an over-the-counter stool softener) twice a day as needed for constipation.  Do not take Tylenol if you are taking prescription pain medications.  Follow Up  Our office will schedule a follow up appointment 2-3 weeks following discharge.  Please call us immediately for any of the following conditions  Severe or worsening pain in your legs or feet while at rest or while walking Increase pain, redness, warmth, or drainage (pus) from your incision site(s) Fever of 101 degree or higher The swelling in your leg with the bypass suddenly worsens and becomes more painful than when you were in the hospital If you have   been instructed to feel your graft pulse then you should do so every day. If you can no longer feel this pulse, call the office immediately. Not all patients are given this instruction.  Leg swelling is common after leg bypass surgery.  The swelling should improve over a few months following surgery. To improve the swelling, you may elevate your legs above the level of your heart while you are  sitting or resting. Your surgeon or physician assistant may ask you to apply an ACE wrap or wear compression (TED) stockings to help to reduce swelling.  Reduce your risk of vascular disease  Stop smoking. If you would like help call [facility] at 1-800-QUIT-NOW (1-800-784-8669) or [place] at 336-586-4000.  Manage your cholesterol Maintain a desired weight Control your diabetes weight Control your diabetes Keep your blood pressure down  If you have any questions, please call the office at [phone]  

## 2019-10-21 NOTE — Discharge Summary (Signed)
Discharge Summary     Morgan Beasley [birth year] 59 y.o. female  [id number]  Admission Date: 10/09/2019  Discharge Date: 10/20/2021  Physician: No att. providers found  Admission Diagnosis: PAD (peripheral artery disease) ([place]) [I73.9] Left leg cellulitis [V56.433] Ischemia of left lower extremity [I99.8]  HPI:   This is a 59 y.o. female who underwent underwent recent left SFA stenting for wounds on her bilateral lower extremities.  At the time she had occluded left SFA.  Common femoral appeared healthy.  She had two-vessel runoff via the anterior tibial and peroneal arteries at the time.  This is consistent with today CT scan.  Findings of the CT scan of the right lower extremity appear chronic.  She appears to be back to her baseline level of chronic ischemia of the left lower extremity with preserved sensorimotor function.  She does have a ruptured bullae on the left foot which is quite painful I do not think this is related to an ischemic process but more related to her recent CHF exacerbation.  Both feet are similar temperature and have similarly weak monophasic anterior tibial and peroneal artery signals.  Patient will likely require left lower extremity revascularization given the wounds on her foot at this time.  I have offered her angiography tonight with possible lysis catheter placement although she has had some minimal blood per rectum.  I have also discussed the possibility of left lower extremity thromboembolectomy which would also likely require angiography with possible repeat stenting.  I discussed with the patient and her daughter that although these procedures would not require significant incisions I am unsure that repeat stenting would have any greater effect than her most recent procedure.  Because of this we discussed possible left common femoral to popliteal artery bypass with either vein or graft.  I have offered the patient to have this procedure tonight  although I think that she is mostly at her chronic level of ischemia.  Patient would rather not have it tonight as she desires to eat above all else at this time.    We will get her admitted tonight place her on a heparin drip reevaluate her ABIs and also get bilateral lower extremity greater saphenous vein mapping.  She would likely also benefit from cardiac evaluation given that I do not see any recent evaluation and also she had recent hospitalization for CHF exacerbation and has risk factors of diabetes, hypertension, hyperlipidemia and current smoking.  I discussed the plan in detail with the patient and her daughter via telephone.  They demonstrate good understanding and also that there could be changes in the plan based on the patient's symptoms.  Hospital Course:  The patient was admitted to the hospital and taken to the operating room on 10/14/2019 and underwent:Harvesrt of left greater saphenous vein and left common femoral to below knee popliteal artery bypass with non reversed ipsilateral and translocated greater saphenous vein.  Findings: The left common femoral artery was soft.  There was some soft plaque.  Below-knee popliteal artery was similarly soft.  Greater saphenous vein did measure approximately 3 to 3-1/2 mm throughout its course.  This appeared good for bypass.  At completion there was good anterior tibial artery signal at the ankle that augmented with compression of the bypass graft.  The pt tolerated the procedure well and was transported to the PACU in good condition.   By POD 1, complaining of left lower extremity incisional pain. Otherwise all incisions remain clean, dry and intact. Left  foot wound dry. Doppler brisk DP and dampened peroneal signal. Motor and sensory intact. Foot well perfused. Pain control and mobilization were goals of POD#1. She was seen and evaluated by cardiology who recommended starting metoprolol succinate and discontinue Diltiazem. These changes  were made.  PT/OT recommended SNF. Patient finally agreed to work with mobility specialist POD #3. Left leg remained well perfused and warm with doppler DP signals. Patient declined SNF recommendation. Agreeable to discharge home with Home health. 3 in 1 commode ordered for her use at home.  POD# 8 she remained stable and ready for discharge. Left lower extremity well perfused. The remainder of the hospital course consisted of increasing mobilization and increasing intake of solids without difficulty as well as pain control  She was discharged home POD#8. Outpatient follow up with Cardiology was arranged. She will continue her home medications. She will continue her Metoprolol Succinate and not restart diltiazem. She was given prescription for acute post operative pain. She will continue her Atorvastatin and Plavix. She will follow up in the office in 4-6 weeks with Dr. Donzetta Matters CBC    Component Value Date/Time   WBC 7.7 10/19/2019 0443   RBC 4.02 10/19/2019 0443   HGB 10.7 (L) 10/19/2019 0443   HCT 34.3 (L) 10/19/2019 0443   PLT 354 10/19/2019 0443   MCV 85.3 10/19/2019 0443   MCH 26.6 10/19/2019 0443   MCHC 31.2 10/19/2019 0443   RDW 17.1 (H) 10/19/2019 0443   LYMPHSABS 2.8 10/09/2019 1710   MONOABS 0.8 10/09/2019 1710   EOSABS 0.2 10/09/2019 1710   BASOSABS 0.0 10/09/2019 1710    BMET    Component Value Date/Time   NA 139 10/19/2019 0443   K 3.1 (L) 10/19/2019 0443   CL 103 10/19/2019 0443   CO2 26 10/19/2019 0443   GLUCOSE 211 (H) 10/19/2019 0443   BUN 14 10/19/2019 0443   CREATININE 1.19 (H) 10/19/2019 0443   CALCIUM 8.0 (L) 10/19/2019 0443   GFRNONAA 50 (L) 10/19/2019 0443   GFRAA 58 (L) 10/19/2019 0443     Discharge Instructions    Discharge patient   Complete by: As directed    Discharge disposition: 01-Home or Self Care   Discharge patient date: 10/21/2019      Discharge Diagnosis:  PAD (peripheral artery disease) ([place]) [I73.9] Left leg cellulitis  [X48.016] Ischemia of left lower extremity [I99.8]  Secondary Diagnosis: Patient Active Problem List   Diagnosis Date Noted  . Ischemia of left lower extremity 10/09/2019  . PVD (peripheral vascular disease) ([place])   . Diabetic peripheral neuropathy associated with type 2 diabetes mellitus ([place])   . Acute on chronic diastolic congestive heart failure ([place])   . Lower extremity edema   . Dyspnea 09/21/2019  . Bronchitis 02/25/2019  . Diabetes mellitus ([place]) 02/25/2019  . Peripheral neuropathy 02/25/2019  . COPD (chronic obstructive pulmonary disease) ([place]) 02/25/2019  . Malignant neoplasm of upper-outer quadrant of right breast in female, estrogen receptor positive ([place]) 01/08/2019   Past Medical History:  Diagnosis Date  . Alcohol dependence ([place])   . Anxiety   . Breast cancer ([place])   . CHF (congestive heart failure) ([place])   . Cigarette nicotine dependence   . Colon polyps   . COPD (chronic obstructive pulmonary disease) ([place])   . Diabetes mellitus without complication ([place])   . Diabetic neuropathy ([place])   . Elevated lipase   . Gout   . Hyperlipemia   . Hypertension   . Insomnia   . Lymphedema   .  Marijuana use   . OSA treated with BiPAP   . PAD (peripheral artery disease) ([place])   . Sleep apnea    wears BIPAP  . Ulcer of foot ([place])    right     Allergies as of 10/21/2019      Reactions   Nsaids Other (See Comments)   Pancreatitis   Tolmetin Other (See Comments)   Pancreatitis   Aspirin Other (See Comments)   "Makes my pancreas act up"    Tramadol Swelling      Medication List    STOP taking these medications   Dilt-XR 120 MG 24 hr capsule Generic drug: diltiazem     TAKE these medications   acetaminophen 325 MG tablet Commonly known as: TYLENOL Take 2 tablets (650 mg total) by mouth every 4 (four) hours as needed for headache or mild pain.   amitriptyline 100 MG tablet Commonly known as: ELAVIL Take 200 mg by mouth at bedtime.   amitriptyline 10 MG  tablet Commonly known as: ELAVIL Take 5 tablets (50 mg total) by mouth at bedtime.   atorvastatin 40 MG tablet Commonly known as: LIPITOR Take 40 mg by mouth daily.   budesonide-formoterol 160-4.5 MCG/ACT inhaler Commonly known as: SYMBICORT Inhale 2 puffs into the lungs 2 (two) times daily.   clopidogrel 75 MG tablet Commonly known as: Plavix Take 1 tablet (75 mg total) by mouth daily.   dapagliflozin propanediol 10 MG Tabs tablet Commonly known as: FARXIGA Take 1 tablet (10 mg total) by mouth daily before breakfast.   gabapentin 800 MG tablet Commonly known as: NEURONTIN Take 800 mg by mouth 3 (three) times daily.   glimepiride 4 MG tablet Commonly known as: AMARYL Take 8 mg by mouth daily with breakfast.   HumaLOG 100 UNIT/ML injection Generic drug: insulin lispro Inject 14 Units into the skin 3 (three) times daily before meals.   HYDROcodone-acetaminophen 5-325 MG tablet Commonly known as: NORCO/VICODIN Take 1 tablet by mouth every 4 (four) hours as needed for moderate pain.   insulin glargine 100 UNIT/ML injection Commonly known as: Lantus Inject 0.4 mLs (40 Units total) into the skin daily. What changed: how much to take   lipase/protease/amylase 36000 UNITS Cpep capsule Commonly known as: Creon Take 2 capsule by mouth with each meal and 1 capsule by mouth with each snack   losartan 50 MG tablet Commonly known as: COZAAR Take 50 mg by mouth daily.   metoprolol succinate 25 MG 24 hr tablet Commonly known as: TOPROL-XL Take 1 tablet (25 mg total) by mouth daily.   Myrbetriq 50 MG Tb24 tablet Generic drug: mirabegron ER Take 50 mg by mouth daily.   potassium chloride 10 MEQ tablet Commonly known as: KLOR-CON Take 10 mEq by mouth daily as needed (For cramps).   ProAir HFA 108 (90 Base) MCG/ACT inhaler Generic drug: albuterol Inhale 2 puffs into the lungs every 6 (six) hours as needed for shortness of breath.   Toremifene Citrate 60 MG  tablet Commonly known as: FARESTON Take 1 tablet (60 mg total) by mouth daily.   torsemide 20 MG tablet Commonly known as: DEMADEX Take 2 tablets (40 mg total) by mouth daily.       Discharge Instructions: [facility] Discharge instructions Lower Extremity Bypass Surgery  Please refer to the following instruction for your post-procedure care. Your surgeon or physician assistant will discuss any changes with you.  Activity  You are encouraged to walk as much as you can.  You can slowly return to normal activities during the month after your surgery. Avoid strenuous activity and heavy lifting until your doctor tells you it's OK. Avoid activities such as vacuuming or swinging a golf club. Do not drive until your doctor give the OK and you are no longer taking prescription pain medications. It is also normal to have difficulty with sleep habits, eating and bowel movement after surgery. These will go away with time.  Bathing/Showering  You may shower after you go home. Do not soak in a bathtub, hot tub, or swim until the incision heals completely.  Incision Care  Clean your incision with mild soap and water. Shower every day. Pat the area dry with a clean towel. You do not need a bandage unless otherwise instructed. Do not apply any ointments or creams to your incision. If you have open wounds you will be instructed how to care for them or a visiting nurse may be arranged for you. If you have staples or sutures along your incision they will be removed at your post-op appointment. You may have skin glue on your incision. Do not peel it off. It will come off on its own in about one week.  Wash the groin wound with soap and water daily and pat dry. (No tub bath-only shower)  Then put a dry gauze or washcloth in the groin to keep this area dry to help prevent wound infection.  Do this daily and as needed.  Do not use Vaseline or neosporin on your incisions.  Only  use soap and water on your incisions and then protect and keep dry.  Diet  Resume your normal diet. There are no special food restrictions following this procedure. A low fat/ low cholesterol diet is recommended for all patients with vascular disease. In order to heal from your surgery, it is CRITICAL to get adequate nutrition. Your body requires vitamins, minerals, and protein. Vegetables are the best source of vitamins and minerals. Vegetables also provide the perfect balance of protein. Processed food has little nutritional value, so try to avoid this.  Medications  Resume taking all your medications unless your doctor or Physician Assistant tells you not to. If your incision is causing pain, you may take over-the-counter pain relievers such as acetaminophen (Tylenol). If you were prescribed a stronger pain medication, please aware these medication can cause nausea and constipation. Prevent nausea by taking the medication with a snack or meal. Avoid constipation by drinking plenty of fluids and eating foods with high amount of fiber, such as fruits, vegetables, and grains. Take Colace 100 mg (an over-the-counter stool softener) twice a day as needed for constipation.  Do not take Tylenol if you are taking prescription pain medications.  Follow Up  Our office will schedule a follow up appointment 4-6 weeks following discharge.  Please call us immediately for any of the following conditions  .Severe or worsening pain in your legs or feet while at rest or while walking .Increase pain, redness, warmth, or drainage (pus) from your incision site(s) . Fever of 101 degree or higher . The swelling in your leg with the bypass suddenly worsens and becomes more painful than when you were in the hospital . If you have been instructed to feel your graft pulse then you should do so every day. If you can no longer feel this pulse, call the office immediately. Not all patients are given this  instruction. .  Leg swelling is common after leg bypass surgery.  The  swelling should improve over a few months following surgery. To improve the swelling, you may elevate your legs above the level of your heart while you are sitting or resting. Your surgeon or physician assistant may ask you to apply an ACE wrap or wear compression (TED) stockings to help to reduce swelling.  Reduce your risk of vascular disease  Stop smoking. If you would like help call [facility] at 1-800-QUIT-NOW 916 832 7934) or [name] at (863)532-9800.  . Manage your cholesterol . Maintain a desired weight . Control your diabetes weight . Control your diabetes . Keep your blood pressure down .  If you have any questions, please call the office at [phone]   Prescriptions given: Metoprolol succinate 25 mg daily #30 with 1 refill Hydrocodone- Acetaminophen 5-325 mg #20 with no refills  Disposition: Home  Patient's condition: is Good  Follow up: 1. Dr. Donzetta Matters in 4-6 weeks with ABI's and left lower extremity arterial duplex   Paulo Fruit, PA-C [facility] [phone] 10/22/2019  10:56 AM  - For VQI Registry use ---   Post-op:  Wound infection: No  Graft infection: No  Transfusion: No    If yes, n/a units given New Arrhythmia: No Ipsilateral amputation: No, [ ]  Minor, [ ]  BKA, [ ]  AKA Discharge patency: Valu.Nieves ] Primary, [ ]  Primary assisted, [ ]  Secondary, [ ]  Occluded Patency judged by: [ X] Dopper only, [ ]  Palpable graft pulse, []  Palpable distal pulse, [ ]  ABI inc. > 0.15, [ ]  Duplex D/C Ambulatory Status: Ambulatory with Assistance  Complications: MI: No, [ ]  Troponin only, [ ]  EKG or Clinical CHF: No Resp failure:No, [ ]  Pneumonia, [ ]  Ventilator Chg in renal function: No, [ ]  Inc. Cr > 0.5, [ ]  Temp. Dialysis,  [ ]  Permanent dialysis Stroke: No, [ ]  Minor, [ ]  Major Return to OR: No  Reason for return to OR: [ ]  Bleeding, [ ]  Infection, [ ]  Thrombosis, [ ]   Revision  Discharge medications: Statin use:  yes ASA use:  no Plavix use:  yes Beta blocker use: yes CCB use:  No ACEI use:   no ARB use:  yes Coumadin use: no

## 2019-10-21 NOTE — Progress Notes (Signed)
Occupational Therapy Treatment Patient Details Name: Morgan Beasley MRN: [id number] DOB: Oct 22, 1960 Today's Date: 10/21/2019    History of present illness 59 year old female with history of left SFA stenting.  Stent subsequently occluded.  She now has critical limb ischemia with sloughing of skin on the left foot. Pt s/p left common femoral to below-knee popliteal artery bypass.   OT comments  Patient continues to make steady progress towards goals in skilled OT session. Patient's session encompassed functional mobility and completion of basic ADL tasks. Pts greatest limitation pain, but is progressing well and demonstrates a significant improvement in cognition in session with therapist. Pt remains motivated to get home and was engaged in education with regard to fall prevention, energy conservation, and activities to increase overall endurance; will continue to follow acutely.    Follow Up Recommendations  SNF;Supervision/Assistance - 24 hour    Equipment Recommendations  Other (comment) (TBD)    Recommendations for Other Services      Precautions / Restrictions Precautions Precautions: Fall Precaution Comments: monitor O2 sats Restrictions Weight Bearing Restrictions: No       Mobility Bed Mobility               General bed mobility comments: Sitting EOB upon arrival  Transfers Overall transfer level: Needs assistance Equipment used: Rolling walker (2 wheeled) Transfers: Sit to/from Stand Sit to Stand: Min guard         General transfer comment: Able to demonstrate appropriate sequencing, increased time needed due to pain, but extremely participatory in session    Balance Overall balance assessment: Needs assistance Sitting-balance support: Feet supported;Bilateral upper extremity supported Sitting balance-Leahy Scale: Good     Standing balance support: Bilateral upper extremity supported;During functional activity Standing balance-Leahy Scale: Poor  Standing balance comment: Reliant on RW for support                           ADL either performed or assessed with clinical judgement   ADL Overall ADL's : Needs assistance/impaired                     Lower Body Dressing: Maximal assistance;Sitting/lateral leans;Sit to/from stand Lower Body Dressing Details (indicate cue type and reason): Deferring AE at this time, "my granddaughter helps me"             Functional mobility during ADLs: Min guard;Minimal assistance General ADL Comments: Pt with improved performance, completing functional mobility and basic ADLs to date     Vision       Perception     Praxis      Cognition Arousal/Alertness: Awake/alert Behavior During Therapy: WFL for tasks assessed/performed Overall Cognitive Status: Within Functional Limits for tasks assessed                                          Exercises     Shoulder Instructions       General Comments      Pertinent Vitals/ Pain       Pain Assessment: Faces Faces Pain Scale: Hurts little more Pain Location: L LE with movement Pain Descriptors / Indicators: Operative site guarding;Aching;Discomfort Pain Intervention(s): Limited activity within patient's tolerance;Monitored during session;Premedicated before session;Repositioned  Home Living  Prior Functioning/Environment              Frequency  Min 2X/week        Progress Toward Goals  OT Goals(current goals can now be found in the care plan section)  Progress towards OT goals: Progressing toward goals  Acute Rehab OT Goals Patient Stated Goal: to go home OT Goal Formulation: With patient Time For Goal Achievement: 10/29/19 Potential to Achieve Goals: Good  Plan Discharge plan remains appropriate    Co-evaluation                 AM-PAC OT "6 Clicks" Daily Activity     Outcome Measure   Help from another  person eating meals?: None Help from another person taking care of personal grooming?: None Help from another person toileting, which includes using toliet, bedpan, or urinal?: A Little Help from another person bathing (including washing, rinsing, drying)?: A Little Help from another person to put on and taking off regular upper body clothing?: A Little Help from another person to put on and taking off regular lower body clothing?: A Lot 6 Click Score: 19    End of Session Equipment Utilized During Treatment: Rolling walker  OT Visit Diagnosis: Unsteadiness on feet (R26.81);Muscle weakness (generalized) (M62.81);Pain Pain - part of body: Leg;Ankle and joints of foot   Activity Tolerance Patient tolerated treatment well   Patient Left in bed;with call bell/phone within reach   Nurse Communication Mobility status        Time: 1049-1106 OT Time Calculation (min): 17 min  Charges: OT General Charges $OT Visit: 1 Visit OT Treatments $Self Care/Home Management : 8-22 mins  Corinne Ports E. Young, COTA/L Acute Rehabilitation Services [phone] [place] 10/21/2019, 12:25 PM

## 2019-10-21 NOTE — Progress Notes (Signed)
Pt discharged from unit. Medication/discharge instruction given, VVS. Daughter here for transportation   Phoebe Sharps, RN

## 2019-10-21 NOTE — Plan of Care (Signed)
  Problem: Clinical Measurements: Goal: Ability to maintain clinical measurements within normal limits will improve Outcome: Progressing   Problem: Pain Managment: Goal: General experience of comfort will improve Outcome: Progressing   

## 2019-10-21 NOTE — TOC Transition Note (Addendum)
Transition of Care [facility]) - CM/SW Discharge Note Marvetta Gibbons RN, BSN Transitions of Care Unit 4E- RN Case Manager [phone]   Patient Details  Name: Morgan Beasley MRN: [id number] Date of Birth: 01/29/1961  Transition of Care [facility]) CM/SW Contact:  Dawayne Patricia, RN Phone Number: 10/21/2019, 10:14 AM   Clinical Narrative:    Pt stable for transition home today, orders placed for DME and HH needs. CM spoke with pt at bedside regarding transition needs- per pt she has PCS services approved for 80hr/mo and she is trying to get additional hours. She has rollator but needs new one, order and 3n1. Pt agreeable to having [employer] deliver to room prior to discharge.  Per pt she using [employer] transport, however for discharge family will transport home as [facility] needs 3 day notice. Pt voiced she has no difficulty getting meds.  Discussed HH and offered choice- Per CMS guidelines from medicare.gov website with star ratings (copy placed in shadow chart)- per pt she would like to use [facility] for [facility] needs- call made to Santiago Glad with [facility] to see if they can accept referral- referral has been accepted Call made to [facility] with [employer] for DME needs- 3n1 and rollator to be delivered to room prior to discharge.      Final next level of care: [place] Barriers to Discharge: Barriers Resolved   Patient Goals and CMS Choice Patient states their goals for this hospitalization and ongoing recovery are:: to be independent CMS Medicare.gov Compare Post Acute Care list provided to:: Patient Choice offered to / list presented to : Patient  Discharge Placement               Home with [facility]        Discharge Plan and Services   Discharge Planning Services: CM Consult Post Acute Care Choice: Durable Medical Equipment, Home Health          DME Arranged: 3-N-1, Walker rolling with seat DME Agency: [employer] Date DME Agency Contacted: 10/21/19 Time DME Agency Contacted: [phone] Representative  spoke with at DME Agency: [place]: PT [place]: [place] ([place]) Date [place]: 10/21/19 Time [place]: [phone] Representative spoke with at [place]: [place] ([place]) Interventions     Readmission Risk Interventions Readmission Risk Prevention Plan 10/21/2019  Transportation Screening Complete  Medication Review Press photographer) Complete  PCP or Specialist appointment within 3-5 days of discharge Complete  HRI or [place] Complete  SW Recovery Care/Counseling Consult Complete  [place] Not Applicable  Some recent data might be hidden

## 2019-10-22 NOTE — Telephone Encounter (Signed)
**Note De-Identified  Obfuscation** 1st TCM Call attempt: No answer so I left a message on the pts VM asking her to call Jeani Hawking at [facility] at [phone] concerning her recent [facility]) hospital discharge.

## 2019-10-23 ENCOUNTER — Other Ambulatory Visit: Payer: Self-pay | Admitting: Physician Assistant

## 2019-10-23 ENCOUNTER — Encounter: Payer: Medicare Other | Admitting: Vascular Surgery

## 2019-10-23 ENCOUNTER — Ambulatory Visit (HOSPITAL_COMMUNITY): Payer: Medicare Other

## 2019-10-23 NOTE — Telephone Encounter (Signed)
New Message   *STAT* If patient is at the pharmacy, call can be transferred to refill team.   1. Which medications need to be refilled? (please list name of each medication and dose if known) HYDROcodone-acetaminophen (NORCO/VICODIN) 5-325 MG tablet  2. Which pharmacy/location (including street and city if local pharmacy) is medication to be sent to? [place], [place]  3. Do they need a 30 day or 90 day supply? 30 day

## 2019-10-23 NOTE — Telephone Encounter (Signed)
Pt calling requesting as refill on hydrocodone-acteaminophen. Pt states that she was prescribed this in the hospital after having a heart procedure done. Pt has an upcoming appt with Melina Copa, PA on 11/04/19. Would Melina Copa, PA, like to refill this medication? Pt stated that she is in a lot of pain. Please address

## 2019-10-23 NOTE — Telephone Encounter (Signed)
**Note De-Identified  Obfuscation** Patient contacted regarding discharge from [facility] on 10/21/2019.  Patient understands to follow up with provider Dayna Dunn,PA-c on 11/04/2019 at 2:15 at [address], [place] in [place], [place] [address]. Patient understands discharge instructions? Yes Patient understands medications and regiment? Yes Patient understands to bring all medications to this visit? Yes  Ask patient:  Are you enrolled in My Chart: No not interested

## 2019-10-30 ENCOUNTER — Encounter: Payer: Self-pay | Admitting: Physician Assistant

## 2019-10-30 NOTE — Progress Notes (Deleted)
Cardiology Office Note    Date:  10/30/2019   ID:  Jenean Lindau, DOB [birth year], MRN [id number]  PCP:  Sonia Side., FNP  Cardiologist:  Dorris Carnes, MD  Electrophysiologist:  None   Chief Complaint: f/u cardiomyopathy  History of Present Illness:   Morgan Beasley is a 59 y.o. 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  The patient has history of NSTEMI in 2016 due to cocaine use, with cath showing 50% mRCA, minimal CAD in LCX, LAD. She was admitted 08/2019 with SOB/edema felt due to acute on chronic diastolic CHF with EF showing 50-55%, grade 2 DD.  She was seen by vascular surgery that admission and underwent left SFA stenting with plans to address R SFA at later date. However, she presented back to the hospital with severe leg pain and ultimately required left fem-to-below-knee-popliteal bypass. Cardiology saw the patient for pre-op clearance and did nuclear stress test showing evidence of large infarct and/or extensive soft tissue attenuation (extensive breast), no ischemia, LVEF 31%, breast shadow overall but small part of myocardium, LVEF 31%. Limited echo 10/13/19 showed EF 40-45% with global HK, mildly reduced RV function. Diltiazem was switched to metoprolol. Outpatient follow-up to discuss evaluation of abnormal stress test and low EF recommended.   Chronic combined CHF with LV dysfunction with cardiomyopathy Mild CAD Essential HTN HLD  renal insuff Cocaine  Lipid plan? CMET due to hypokalemia and low albumin - LDL high amitriptyline  Labwork independently reviewed: 09/2019 Hgb 10.7, K 3.1, Cr 1.19, LDL  123, trig 112, HDL 52, BNP 488 08/2019 A1C 11.7, albumin 2.0, normal AST/ALT     Past Medical History:  Diagnosis Date  . Alcohol dependence ([place])   . Anemia   . Anxiety   . Breast cancer ([place])   . Chronic combined systolic and diastolic CHF (congestive heart failure) ([place])   . Cigarette nicotine dependence   . CKD (chronic kidney disease), stage III   . Colon polyps   . COPD (chronic obstructive pulmonary disease) ([place])   . Diabetes mellitus without complication ([place])   . Diabetic neuropathy ([place])   . Elevated lipase   . Gout   . Hyperlipemia   . Hypertension   . Insomnia   . Lymphedema   . Marijuana use   . Mild CAD 2016   a. NSTEMI 2016 in context of cocaine abuse, 50% RCA% at that time.  . OSA treated with BiPAP   . PAD (peripheral artery disease) ([place])    a. s/p L SFA stenting 08/2019. b. left fem-to-below-knee-popliteal bypass 09/2019.  Marland Kitchen Sleep apnea    wears BIPAP  . Ulcer of foot ([place])    right    Past Surgical History:  Procedure Laterality Date  . ABDOMINAL AORTOGRAM W/LOWER EXTREMITY N/A 09/26/2019   Procedure: ABDOMINAL AORTOGRAM W/LOWER EXTREMITY;  Surgeon: Angelia Mould, MD;  Location: [facility];  Service: Cardiovascular;  Laterality: N/A;  . ABDOMINAL HYSTERECTOMY    . [facility]  . FEMORAL-POPLITEAL BYPASS GRAFT Left 10/14/2019   Procedure: BYPASS GRAFT LEFT FEMORAL-POPLITEAL ARTERY USING NONREVERSED SAPHENOUS VEIN;  Surgeon: Waynetta Sandy, MD;  Location: [facility];  Service: Vascular;  Laterality: Left;  Marland Kitchen MASTECTOMY Right April 2016  . PERIPHERAL VASCULAR INTERVENTION Left 09/26/2019   Procedure: PERIPHERAL VASCULAR INTERVENTION;  Surgeon: Angelia Mould, MD;  Location: [facility];  Service: Cardiovascular;  Laterality: Left;  superficial femoral    Current Medications: No outpatient medications have been marked as taking for the 11/04/19 encounter (Appointment) with Charlie Pitter, PA-C.    ***   Allergies:   Nsaids, Tolmetin, Aspirin, and Tramadol   Social History   Socioeconomic History  . Marital status: Single    Spouse name: Not on file  . Number of children: Not on file  . Years of education: Not on file  . Highest education level: Not on file  Occupational History  . Not on file  Tobacco Use  . Smoking status: Light Tobacco Smoker    Types: Cigarettes    Last attempt to quit: 06/04/2019    Years since quitting: 0.4  . Smokeless tobacco: Never Used  Vaping Use  . Vaping Use: Never used  Substance and Sexual Activity  . Alcohol use: Not Currently    Comment: Beer - "on the weekends hanging out, maybe 2 or 3"   . Drug use: Not Currently    Types: Marijuana    Comment: last used 8 2020  . Sexual activity: Not on file  Other Topics Concern  . Not on file  Social History Narrative  . Not on file   Social Determinants of Health   Financial Resource Strain:   . Difficulty of Paying Living Expenses:   Food Insecurity:   . Worried About Charity fundraiser in the Last Year:   . Arboriculturist in the Last Year:   Transportation Needs:   . Film/video editor (Medical):   Marland Kitchen Lack of Transportation (Non-Medical):   Physical Activity:   . Days of Exercise per Week:   . Minutes of Exercise per Session:   Stress:   . Feeling of Stress :   Social Connections:   . Frequency of Communication with Friends and Family:   . Frequency of Social Gatherings with Friends and Family:   . Attends Religious Services:   . Active Member of Clubs or Organizations:   . Attends Archivist Meetings:   Marland Kitchen Marital Status:      Family History:  The patient's ***family history includes Breast cancer in her maternal grandmother and paternal grandmother; Diabetes in an other family member; Heart disease in an other family member; Stroke in her son. There is no history of Colon cancer, Esophageal cancer, Rectal cancer, or Stomach cancer.  ROS:   Please see the  history of present illness. Otherwise, review of systems is positive for ***.  All other systems are reviewed and otherwise negative.    EKGs/Labs/Other Studies Reviewed:    Studies reviewed are outlined and summarized above. Reports included below if pertinent.  Limited echo 10/13/19 1. Left ventricular ejection fraction, by estimation, is 40 to 45%. The  left ventricle has mildly decreased function. The left ventricle  demonstrates global hypokinesis.  2. Right ventricular systolic function is mildly reduced. The right  ventricular size is normal.  3. The mitral valve is normal in structure. No evidence of mitral valve  regurgitation.  4. The aortic valve is tricuspid. Aortic valve regurgitation is not  visualized. No aortic stenosis is present.  5. The inferior vena cava is normal in size with <50% respiratory  variability, suggesting right atrial pressure of 8 mmHg.   Lexiscan myoview 10/12/19  There was no ST segment deviation noted during stress.  Perfusion scan with evidence of large infarct and/or extensive soft tissue attenuation (extensive breast). No ischemia LVEF 31% Review of the raw images shows breast shadow overall but small part of myocardium  LVEF 31%  High risk scan due to possible scar and depressed LVEF  REcomm limited echo to reconfirm LVEF  Echocardiogram 09/22/19: 1. Left ventricular ejection fraction, by estimation, is 50 to 55%. The  left ventricle has low normal function. The left ventricle has no regional  wall motion abnormalities. Left ventricular diastolic parameters are  consistent with Grade II diastolic  dysfunction (pseudonormalization).  2. Right ventricular systolic function is normal. The right ventricular  size is normal.  3. The mitral valve is normal in structure. Trivial mitral valve  regurgitation. No evidence of mitral stenosis.  4. The aortic valve is normal in structure. Aortic valve regurgitation is  not visualized. No  aortic stenosis is present.  Left heart catheterization 2016: CONCLUSIONS:     CORONARY STATUS: Mild non-obstructive ASCAD      LV FUNCTION:        Ejection Fraction:  55%        Wall Motion:     Normal       OTHER: LCP for low grade NSTEMI in setting of cocaine+ UDS. 50% mRCA,  minimal CAD in LCX, LAD. Normal LVEF at 55%. EBL <53mL. No specimens. 88F  right radial. TR band for hemostasis.     EKG:  EKG is ordered today, personally reviewed, demonstrating ***  Recent Labs: 09/21/2019: ALT 20 10/09/2019: B Natriuretic Peptide 488.5 10/19/2019: BUN 14; Creatinine, Ser 1.19; Hemoglobin 10.7; Platelets 354; Potassium 3.1; Sodium 139  Recent Lipid Panel    Component Value Date/Time   CHOL 196 10/11/2019 1101   TRIG 112 10/11/2019 1101   HDL 51 10/11/2019 1101   CHOLHDL 3.8 10/11/2019 1101   VLDL 22 10/11/2019 1101   LDLCALC 123 (H) 10/11/2019 1101    PHYSICAL EXAM:    VS:  There were no vitals taken for this visit.  BMI: There is no height or weight on file to calculate BMI.  GEN: Well nourished, well developed, in no acute distress HEENT: normocephalic, atraumatic Neck: no JVD, carotid bruits, or masses Cardiac: ***RRR; no murmurs, rubs, or gallops, no edema  Respiratory:  clear to auscultation bilaterally, normal work of breathing GI: soft, nontender, nondistended, + BS MS: no deformity or atrophy Skin: warm and dry, no rash Neuro:  Alert and Oriented x 3, Strength and sensation are intact, follows commands Psych: euthymic mood, full affect  Wt Readings from Last 3 Encounters:  10/21/19 206 lb 5.6 oz (93.6 kg)  09/27/19 209 lb 3.2 oz (94.9 kg)  07/15/19 215 lb (97.5 kg)     ASSESSMENT & PLAN:   1. ***  Disposition: F/u with ***   Medication Adjustments/Labs and Tests Ordered: Current medicines are reviewed at length with the patient today.  Concerns regarding medicines are outlined above. Medication changes, Labs and Tests  ordered today are summarized above and listed in the Patient Instructions accessible in Encounters.   Signed, Charlie Pitter, PA-C  10/30/2019 4:46 PM    [facility] [place], [place], [place]  [address] Phone: [phone]; Fax: [phone]

## 2019-11-04 ENCOUNTER — Ambulatory Visit: Payer: Medicare Other | Admitting: Physician Assistant

## 2019-11-04 NOTE — Progress Notes (Deleted)
Cardiology Office Note:    Date:  11/04/2019   ID:  Jenean Lindau, DOB 1960/12/31, MRN [id number]  PCP:  Sonia Side., FNP  Cardiologist:  Morgan Carnes, MD  Electrophysiologist:  None   Referring MD: Sonia Side., FNP   Chief Complaint: ***  History of Present Illness:    Morgan Beasley is a 59 y.o. female with a history of non-obstructive CAD on cath in 1027, chronic diastolic CHF, PAD s/p ***, COPD, obstructive sleep apnea on BiPAP, hypertension, hyperlipidemia, type 2 diabetes mellitus, gout, breast cancer s/p right mastectomy, and tobacco/alchool abuse who is followed by Dr. Harrington Challenger and presents today for hospital follow-up.   Patient has history of NSTEMI in 2016 in the setting of cocaine use. Cardiac catheterization showed 50% stenosis of RCA and minimal disease in LAD and LCX. She was not seen by Cardiology. She was loss to follow-up after this.   She was admitted in 08/2019 with acute on chronic diastolic CHF after presenting with shortness of breath and lower extremity edema. Echo showed LVEF of 50-55% with grade 2 diastolic dysfunction and no significant valvular disease. She was diuresed with IV Lasix and discharged on Torsemide 40mg  daily. She was not seen by Cardiology during this admission; however, she was seen by Vascular Surgery for abnormal ABIs consistent with severe PAD. She ultimately underwent stenting of left SFA on 09/26/2019 with plans to discuss right SFA occlusion at outpatient follow-up. Hospital course was complicated by cellulitis which was treated with PO antibiotics.   She was readmitted from 10/09/2019 to 10/21/2019 with worsening left foot/leg pain. Repeat ABI and vein mapping were performed. Left SFA stent was occluded. Left femoral to popliteal artery bypass was recommended. Cardiology was consulted for pre-op evaluation. Patient described chronic dyspnea on exertion and occasional chest pressure. It was unclear whether or not chest pain was worse  with activity. Myoview was ordered for further evaluation and showed large infarct and/or extensive soft tissue attenuation but no ischemia. EF was low at 31% so it was considered high risk. Limited Echo was ordered for further evaluation and showed LVEF of 40-45% with global hypokinesis. RV normal in size but systolic function mildly reduced. She was felt to be OK for surgery. She underwent surgery on 10/14/2019. Given mildly reduced LVEF, Diltiazem was stopped and Metoprolol was added prior to discharge. She was discharged with home health. Per last Cardiology note, need to discuss possible cath at outpatient follow-up given abnormal stress test with low EF.   Non-Obstructive CAD - Cardiac cath in 2016 showed 50% stenosis of RCA with minimal disease in LAD and LCX. Recent nuclear stress showed large fix defect (scar vs artifact) but no ischemia.  - *** - Allergy to statin. Continue Plavix and high-intensity statin.  Ischemic Cardiomyopathy - Most recent Echo showed LVEF of 40-45% with diffuse global hypokinesis.  - *** - Continue Losartan 50mg  daily and Toprol-XL 25mg  daily.  - Cath ***  Hypertension  Hyperlipidemia - Lipid panel from 10/11/2019: Total Cholesterol 196, Triglycerides, HDl 51, LDL 123. - LDL goal <70 given CAD and PAD. - Lipitor 40mg  daily.  Increase to 80mg  daily. ***  Type 2 Diabetes Mellitus - Hemoglobin A1c 11.7 in 08/2019. - On Farxiga, Glimepiride, and Insulin a home.    Past Medical History:  Diagnosis Date  . Alcohol dependence ([place])   . Anemia   . Anxiety   . Breast cancer ([place])   . Chronic combined systolic and diastolic CHF (congestive  heart failure) ([place])   . Cigarette nicotine dependence   . CKD (chronic kidney disease), stage III   . Colon polyps   . COPD (chronic obstructive pulmonary disease) ([place])   . Diabetes mellitus without complication ([place])   . Diabetic neuropathy ([place])   . Elevated lipase   . Gout   . Hyperlipemia   . Hypertension   .  Insomnia   . Lymphedema   . Marijuana use   . Mild CAD 2016   a. NSTEMI 2016 in context of cocaine abuse, 50% RCA% at that time.  . OSA treated with BiPAP   . PAD (peripheral artery disease) ([place])    a. s/p L SFA stenting 08/2019. b. left fem-to-below-knee-popliteal bypass 09/2019.  Marland Kitchen Sleep apnea    wears BIPAP  . Ulcer of foot ([place])    right    Past Surgical History:  Procedure Laterality Date  . ABDOMINAL AORTOGRAM W/LOWER EXTREMITY N/A 09/26/2019   Procedure: ABDOMINAL AORTOGRAM W/LOWER EXTREMITY;  Surgeon: Angelia Mould, MD;  Location: [facility];  Service: Cardiovascular;  Laterality: N/A;  . ABDOMINAL HYSTERECTOMY    . [facility]  . FEMORAL-POPLITEAL BYPASS GRAFT Left 10/14/2019   Procedure: BYPASS GRAFT LEFT FEMORAL-POPLITEAL ARTERY USING NONREVERSED SAPHENOUS VEIN;  Surgeon: Waynetta Sandy, MD;  Location: [facility];  Service: Vascular;  Laterality: Left;  Marland Kitchen MASTECTOMY Right April 2016  . PERIPHERAL VASCULAR INTERVENTION Left 09/26/2019   Procedure: PERIPHERAL VASCULAR INTERVENTION;  Surgeon: Angelia Mould, MD;  Location: [facility];  Service: Cardiovascular;  Laterality: Left;  superficial femoral    Current Medications: No outpatient medications have been marked as taking for the 11/07/19 encounter (Appointment) with Darreld Mclean, PA-C.     Allergies:   Nsaids, Tolmetin, Aspirin, and Tramadol   Social History   Socioeconomic History  . Marital status: Single    Spouse name: Not on file  . Number of children: Not on file  . Years of education: Not on file  . Highest education level: Not on file  Occupational History  . Not on file  Tobacco Use  . Smoking status: Light Tobacco Smoker    Types: Cigarettes    Last attempt to quit: 06/04/2019    Years since quitting: 0.4  . Smokeless tobacco: Never Used  Vaping Use  . Vaping Use: Never used  Substance and Sexual Activity  . Alcohol use: Not Currently      Comment: Beer - "on the weekends hanging out, maybe 2 or 3"   . Drug use: Not Currently    Types: Marijuana    Comment: last used 8 2020  . Sexual activity: Not on file  Other Topics Concern  . Not on file  Social History Narrative  . Not on file   Social Determinants of Health   Financial Resource Strain:   . Difficulty of Paying Living Expenses:   Food Insecurity:   . Worried About Charity fundraiser in the Last Year:   . Arboriculturist in the Last Year:   Transportation Needs:   . Film/video editor (Medical):   Marland Kitchen Lack of Transportation (Non-Medical):   Physical Activity:   . Days of Exercise per Week:   . Minutes of Exercise per Session:   Stress:   . Feeling of Stress :   Social Connections:   . Frequency of Communication with Friends and Family:   . Frequency of Social Gatherings  with Friends and Family:   . Attends Religious Services:   . Active Member of Clubs or Organizations:   . Attends Archivist Meetings:   Marland Kitchen Marital Status:      Family History: The patient's ***family history includes Breast cancer in her maternal grandmother and paternal grandmother; Diabetes in an other family member; Heart disease in an other family member; Stroke in her son. There is no history of Colon cancer, Esophageal cancer, Rectal cancer, or Stomach cancer.  ROS:   Please see the history of present illness.    *** All other systems reviewed and are negative.  EKGs/Labs/Other Studies Reviewed:    The following studies were reviewed today:  Echocardiogram 09/22/2019: Impressions: 1. Left ventricular ejection fraction, by estimation, is 50 to 55%. The  left ventricle has low normal function. The left ventricle has no regional  wall motion abnormalities. Left ventricular diastolic parameters are  consistent with Grade II diastolic  dysfunction (pseudonormalization).  2. Right ventricular systolic function is normal. The right ventricular  size is normal.   3. The mitral valve is normal in structure. Trivial mitral valve  regurgitation. No evidence of mitral stenosis.  4. The aortic valve is normal in structure. Aortic valve regurgitation is  not visualized. No aortic stenosis is present. _______________  Myoview 10/12/2019:  There was no ST segment deviation noted during stress.  Perfusion scan with evidence of large infarct and/or extensive soft tissue attenuation (extensive breast). No ischemia LVEF 31% Review of the raw images shows breast shadow overall but small part of myocardium  LVEF 31%  High risk scan due to possible scar and depressed LVEF  Recommend limited echo to reconfirm LVEF _______________  Echocardiogram 10/13/2019: Impression: 1. Left ventricular ejection fraction, by estimation, is 40 to 45%. The  left ventricle has mildly decreased function. The left ventricle  demonstrates global hypokinesis.  2. Right ventricular systolic function is mildly reduced. The right  ventricular size is normal.  3. The mitral valve is normal in structure. No evidence of mitral valve  regurgitation.  4. The aortic valve is tricuspid. Aortic valve regurgitation is not  visualized. No aortic stenosis is present.  5. The inferior vena cava is normal in size with <50% respiratory  variability, suggesting right atrial pressure of 8 mmHg.  EKG:  EKG *** ordered today. EKG personally reviewed and demonstrates ***.  Recent Labs: 09/21/2019: ALT 20 10/09/2019: B Natriuretic Peptide 488.5 10/19/2019: BUN 14; Creatinine, Ser 1.19; Hemoglobin 10.7; Platelets 354; Potassium 3.1; Sodium 139  Recent Lipid Panel    Component Value Date/Time   CHOL 196 10/11/2019 1101   TRIG 112 10/11/2019 1101   HDL 51 10/11/2019 1101   CHOLHDL 3.8 10/11/2019 1101   VLDL 22 10/11/2019 1101   LDLCALC 123 (H) 10/11/2019 1101    Physical Exam:    Vital Signs: There were no vitals taken for this visit.    Wt Readings from Last 3 Encounters:   10/21/19 206 lb 5.6 oz (93.6 kg)  09/27/19 209 lb 3.2 oz (94.9 kg)  07/15/19 215 lb (97.5 kg)     General: 59 y.o. female in no acute distress. HEENT: Normocephalic and atraumatic. Sclera clear. EOMs intact. Neck: Supple. No carotid bruits. No JVD. Heart: *** RRR. Distinct S1 and S2. No murmurs, gallops, or rubs. Radial and distal pedal pulses 2+ and equal bilaterally. Lungs: No increased work of breathing. Clear to ausculation bilaterally. No wheezes, rhonchi, or rales.  Abdomen: Soft, non-distended, and non-tender to palpation.  Bowel sounds present in all 4 quadrants.  MSK: Normal strength and tone for age. *** Extremities: No lower extremity edema.    Skin: Warm and dry. Neuro: Alert and oriented x3. No focal deficits. Psych: Normal affect. Responds appropriately.   Assessment:    No diagnosis found.  Plan:     Disposition: Follow up in ***   Medication Adjustments/Labs and Tests Ordered: Current medicines are reviewed at length with the patient today.  Concerns regarding medicines are outlined above.  No orders of the defined types were placed in this encounter.  No orders of the defined types were placed in this encounter.   There are no Patient Instructions on file for this visit.   Signed, Darreld Mclean, PA-C  11/04/2019 7:42 PM    [facility]

## 2019-11-07 ENCOUNTER — Telehealth: Payer: Self-pay | Admitting: Student

## 2019-11-07 ENCOUNTER — Ambulatory Visit: Payer: Medicare Other | Admitting: Student

## 2019-11-07 NOTE — Telephone Encounter (Signed)
Spoke with Morgan Beasley and have rescheduled her appointment for 07/19 at 2:15pm. Advised on the importance of coming to the appointment and arriving 15 minutes early. She verbalized understanding.

## 2019-11-07 NOTE — Telephone Encounter (Signed)
   Pt's nurse Angelia and the pt called, pt missed her appt this morning, she said she took so meds last night and over slept. Next avail appt of APP is on 12/05/19 and pt said she needs to be seen before that. She said to call her nurse back

## 2019-11-11 NOTE — Progress Notes (Signed)
CARDIOLOGY OFFICE NOTE  Date:  11/17/2019    Morgan Beasley Date of Birth: October 25, 1960 Medical Record #[id number]  PCP:  Sonia Side., FNP  Cardiologist:  ROSS (NEW)  Chief Complaint  Patient presents with  . Hospitalization Follow-up    Seen for Dr. Harrington Challenger (NEW)    History of Present Illness: Morgan Beasley is a 59 y.o. female who presents today for a post hospital visit. Seen for Dr. Harrington Challenger (NEW).   She has a history of non-obstructive CAD, chronic diastolic CHF, HTN, HLD, PAD s/p L SFA stenting 09/26/19, DM type 2, Gout, COPD, tobacco and ETOH abuse, breast cancer s/p right mastectomy, and OSA on BiPAP.   She was seen for preoperative cardiac clearance at the request of Dr. Donzetta Matters for left common femoral to popliteal artery bypass surgery.  She had been admitted in late May with acute diastolic HF. EF 50 to 55%. Seen at that time by vascular for abnormal ABIs and had left SFA stenting with plans to discuss her right SFA occlusion as an outpatient. Her stay was complicated by cellulitis. CCB was changed over to beta blocker. Stress testing was abnormal with low EF noted. She was to come in for discussion as outpatient and to consider cardiac catheterization.   She had prior LHC in 2016 due to NSTEMI in setting of cocaine use - had 50% RCA disease and otherwise minimal CAD. Did not follow back up with cardiology since that time until this most recent admission.    Comes in today. Here alone. Doing ok - left leg still pretty sore. She has follow up at [facility] this month - she denies any chest pain - says "would not know what that was". She seems to not really know what the events that happened during this admission - says "was just so out of it" because of her leg. Not aware of the stress test/echo results. She is unsure about proceeding with cardiac cath. Smoking about a pack every 3 days of cigarettes. Denies current cocaine use. She has chronic shortness of breath - she  notes this is from her COPD.  Basically lives alone. She has a nurse that checks on her and a daughter that checks in on her. She has had some palpitations - worse if she gets stressed out about anything. Says she has stopped her alcohol about a year ago. She has been vaccinated against COVID 19. She does not seem to really know her medicines.   Past Medical History:  Diagnosis Date  . Alcohol dependence ([place])   . Anemia   . Anxiety   . Breast cancer ([place])   . Chronic combined systolic and diastolic CHF (congestive heart failure) ([place])   . Cigarette nicotine dependence   . CKD (chronic kidney disease), stage III   . Colon polyps   . COPD (chronic obstructive pulmonary disease) ([place])   . Diabetes mellitus without complication ([place])   . Diabetic neuropathy ([place])   . Elevated lipase   . Gout   . Hyperlipemia   . Hypertension   . Insomnia   . Lymphedema   . Marijuana use   . Mild CAD 2016   a. NSTEMI 2016 in context of cocaine abuse, 50% RCA% at that time.  . OSA treated with BiPAP   . PAD (peripheral artery disease) ([place])    a. s/p L SFA stenting 08/2019. b. left fem-to-below-knee-popliteal bypass 09/2019.  Marland Kitchen Sleep apnea    wears BIPAP  .  Ulcer of foot ([place])    right    Past Surgical History:  Procedure Laterality Date  . ABDOMINAL AORTOGRAM W/LOWER EXTREMITY N/A 09/26/2019   Procedure: ABDOMINAL AORTOGRAM W/LOWER EXTREMITY;  Surgeon: Angelia Mould, MD;  Location: [facility];  Service: Cardiovascular;  Laterality: N/A;  . ABDOMINAL HYSTERECTOMY    . [facility]  . FEMORAL-POPLITEAL BYPASS GRAFT Left 10/14/2019   Procedure: BYPASS GRAFT LEFT FEMORAL-POPLITEAL ARTERY USING NONREVERSED SAPHENOUS VEIN;  Surgeon: Waynetta Sandy, MD;  Location: [facility];  Service: Vascular;  Laterality: Left;  Marland Kitchen MASTECTOMY Right April 2016  . PERIPHERAL VASCULAR INTERVENTION Left 09/26/2019   Procedure: PERIPHERAL VASCULAR INTERVENTION;  Surgeon: Angelia Mould, MD;  Location: [facility];  Service: Cardiovascular;  Laterality: Left;  superficial femoral     Medications: Current Meds  Medication Sig  . albuterol (PROAIR HFA) 108 (90 Base) MCG/ACT inhaler Inhale 2 puffs into the lungs every 6 (six) hours as needed for shortness of breath.   . allopurinol (ZYLOPRIM) 300 MG tablet Take 300 mg by mouth daily.  Marland Kitchen amitriptyline (ELAVIL) 10 MG tablet Take 5 tablets (50 mg total) by mouth at bedtime.  Marland Kitchen amitriptyline (ELAVIL) 100 MG tablet Take 200 mg by mouth at bedtime.  Marland Kitchen atorvastatin (LIPITOR) 40 MG tablet Take 40 mg by mouth daily.  . budesonide-formoterol (SYMBICORT) 160-4.5 MCG/ACT inhaler Inhale 2 puffs into the lungs 2 (two) times daily.  . clopidogrel (PLAVIX) 75 MG tablet Take 1 tablet (75 mg total) by mouth daily.  . dapagliflozin propanediol (FARXIGA) 10 MG TABS tablet Take 1 tablet (10 mg total) by mouth daily before breakfast.  . gabapentin (NEURONTIN) 800 MG tablet Take 800 mg by mouth 3 (three) times daily.  Marland Kitchen glimepiride (AMARYL) 4 MG tablet Take 8 mg by mouth daily with breakfast.   . HYDROcodone-acetaminophen (NORCO/VICODIN) 5-325 MG tablet Take 1 tablet by mouth every 4 (four) hours as needed for moderate pain.  Marland Kitchen insulin glargine (LANTUS) 100 UNIT/ML injection Inject 0.4 mLs (40 Units total) into the skin daily. (Patient taking differently: Inject 80 Units into the skin daily. )  . insulin lispro (HUMALOG) 100 UNIT/ML injection Inject 14 Units into the skin 3 (three) times daily before meals.  . lipase/protease/amylase (CREON) 36000 UNITS CPEP capsule Take 2 capsule by mouth with each meal and 1 capsule by mouth with each snack  . losartan (COZAAR) 50 MG tablet Take 50 mg by mouth daily.  . metoprolol succinate (TOPROL-XL) 25 MG 24 hr tablet Take 1 tablet (25 mg total) by mouth daily.  Marland Kitchen MYRBETRIQ 50 MG TB24 tablet Take 50 mg by mouth daily.  . potassium chloride (K-DUR) 10 MEQ tablet Take 10 mEq by mouth daily as  needed (For cramps).   . Toremifene Citrate (FARESTON) 60 MG tablet Take 1 tablet (60 mg total) by mouth daily.     Allergies: Allergies  Allergen Reactions  . Nsaids Other (See Comments)    Pancreatitis  . Tolmetin Other (See Comments)    Pancreatitis  . Aspirin Other (See Comments)    "Makes my pancreas act up"   . Tramadol Swelling    Social History: The patient  reports that she has been smoking cigarettes. She has never used smokeless tobacco. She reports previous alcohol use. She reports previous drug use. Drug: Marijuana.   Family History: The patient's family history includes Breast cancer in her maternal grandmother and paternal grandmother; Diabetes in an other  family member; Heart disease in an other family member; Stroke in her son.   Review of Systems: Please see the history of present illness.   All other systems are reviewed and negative.   Physical Exam: VS:  BP 124/78   Pulse (!) 104   Ht 5\' 5"  (1.651 m)   Wt 199 lb 9.6 oz (90.5 kg)   SpO2 99%   BMI 33.22 kg/m  .  BMI Body mass index is 33.22 kg/m.  Wt Readings from Last 3 Encounters:  11/17/19 199 lb 9.6 oz (90.5 kg)  10/21/19 206 lb 5.6 oz (93.6 kg)  09/27/19 209 lb 3.2 oz (94.9 kg)    General: Alert and in no acute distress. She looks much older than her stated age.   Cardiac: Regular rate and rhythm. Heart rate is a little fast.  No significant edema.  Respiratory:  Lungs are fairly clear to auscultation bilaterally with normal work of breathing.  GI: Soft and nontender.  MS: No deformity or atrophy. Gait and ROM intact. She is using  Skin: Warm and dry. Color is normal.  Neuro:  Strength and sensation are intact and no gross focal deficits noted.  Psych: Alert, appropriate and with normal affect.   LABORATORY DATA:  EKG:  EKG is ordered today.  Personally reviewed by me. This demonstrates sinus tachycardia - HR is 104.  Lab Results  Component Value Date   WBC 7.7 10/19/2019   HGB 10.7  (L) 10/19/2019   HCT 34.3 (L) 10/19/2019   PLT 354 10/19/2019   GLUCOSE 211 (H) 10/19/2019   CHOL 196 10/11/2019   TRIG 112 10/11/2019   HDL 51 10/11/2019   LDLCALC 123 (H) 10/11/2019   ALT 20 09/21/2019   AST 20 09/21/2019   NA 139 10/19/2019   K 3.1 (L) 10/19/2019   CL 103 10/19/2019   CREATININE 1.19 (H) 10/19/2019   BUN 14 10/19/2019   CO2 26 10/19/2019   HGBA1C 11.7 (H) 09/22/2019     BNP (last 3 results) Recent Labs    02/25/19 1313 09/21/19 1449 10/09/19 1715  BNP 152.6* 733.9* 488.5*    ProBNP (last 3 results) No results for input(s): PROBNP in the last 8760 hours.   Other Studies Reviewed Today:  Limited echo 10/13/19 1. Left ventricular ejection fraction, by estimation, is 40 to 45%. The  left ventricle has mildly decreased function. The left ventricle  demonstrates global hypokinesis.  2. Right ventricular systolic function is mildly reduced. The right  ventricular size is normal.  3. The mitral valve is normal in structure. No evidence of mitral valve  regurgitation.  4. The aortic valve is tricuspid. Aortic valve regurgitation is not  visualized. No aortic stenosis is present.  5. The inferior vena cava is normal in size with <50% respiratory  variability, suggesting right atrial pressure of 8 mmHg.    MYOVIEW Narrative & Impression 10/12/2019   There was no ST segment deviation noted during stress.  Perfusion scan with evidence of large infarct and/or extensive soft tissue attenuation (extensive breast). No ischemia LVEF 31% Review of the raw images shows breast shadow overall but small part of myocardium  LVEF 31%  High risk scan due to possible scar and depressed LVEF  REcomm limited echo to reconfirm LVEF    Echocardiogram 09/22/19: 1. Left ventricular ejection fraction, by estimation, is 50 to 55%. The  left ventricle has low normal function. The left ventricle has no regional  wall motion abnormalities. Left ventricular diastolic  parameters are  consistent with Grade II diastolic  dysfunction (pseudonormalization).  2. Right ventricular systolic function is normal. The right ventricular  size is normal.  3. The mitral valve is normal in structure. Trivial mitral valve  regurgitation. No evidence of mitral stenosis.  4. The aortic valve is normal in structure. Aortic valve regurgitation is  not visualized. No aortic stenosis is present.  Left heart catheterization 2016: CONCLUSIONS:   CORONARY STATUS: Mild non-obstructive ASCAD    LV FUNCTION:  Ejection Fraction:  55%  Wall Motion:     Normal     OTHER: LCP for low grade NSTEMI in setting of cocaine+ UDS. 50% mRCA,  minimal CAD in LCX, LAD. Normal LVEF at 55%. EBL <1mL. No specimens. 74F  right radial. TR band for hemostasis.       Assessment and Plan:   1. Prior pre op clearance for left fem pop bypass graft - this led to abnormal Myoview with decreased EF that was confirmed by limited echo - however had normal EF by echo in May. Cardiac cath was felt to be needed. Patient thinks she needs a little longer to recover from her recent vascular event - I agree - I asked her to have family come with her for next visit for further discussion. She does not have chest pain reported. Her shortness of breath is most likely multifactorial - she has ongoing smoking/COPD and possible CHF.   2. HTN - BP is ok on her current regimen.   3. Chronic combined diastolic and now systolic HF - not on great ideal therapy - unclear exactly what she is taking.   4. Tobacco abuse - she is not ready to stop.   5. Reported aspirin intolerance - on Plavix  6. Known CAD - prior cath from 2016. Was managed medically.   7. Hypokalemia - rechecking lab today  8. Anemia - rechecking lab today.   9. Prior drug use - she at first denied drug use. Checking BMEt and CBC today - then wanted to "come clean" with me - admits that "she did things she should not have when her  leg was hurting so bad" - had "no one to help her or to turn to" - admits to recurrent drug use - sounds like because she had such significant pain - this may explain some of the abnormality seen on her cardiac studies. May need to repeat another limited echo prior to proceeding on with cath - regardless - she does not appear ready to undergo a procedure at this time and wishes to hold off. Will get her back for further discussion in about a month and then decide. May need to consider medical management.   Current medicines are reviewed with the patient today.  The patient does not have concerns regarding medicines other than what has been noted above.  The following changes have been made:  See above.  Labs/ tests ordered today include:    Orders Placed This Encounter  Procedures  . Basic metabolic panel  . CBC  . EKG 12-Lead     Disposition:   FU with [place] in about a month.     Patient is agreeable to this plan and will call if any problems develop in the interim.   SignedTruitt Merle, NP  11/17/2019 2:40 PM  [facility] [place] [address] [place], [place]  [address] Phone: [phone] Fax: [phone]

## 2019-11-17 ENCOUNTER — Ambulatory Visit (INDEPENDENT_AMBULATORY_CARE_PROVIDER_SITE_OTHER): Payer: Medicare Other | Admitting: Nurse Practitioner

## 2019-11-17 ENCOUNTER — Encounter: Payer: Self-pay | Admitting: Nurse Practitioner

## 2019-11-17 ENCOUNTER — Other Ambulatory Visit: Payer: Self-pay

## 2019-11-17 VITALS — BP 124/78 | HR 104 | Ht 65.0 in | Wt 199.6 lb

## 2019-11-17 DIAGNOSIS — I5042 Chronic combined systolic (congestive) and diastolic (congestive) heart failure: Secondary | ICD-10-CM | POA: Diagnosis not present

## 2019-11-17 DIAGNOSIS — R9439 Abnormal result of other cardiovascular function study: Secondary | ICD-10-CM | POA: Diagnosis not present

## 2019-11-17 DIAGNOSIS — I259 Chronic ischemic heart disease, unspecified: Secondary | ICD-10-CM

## 2019-11-17 DIAGNOSIS — I10 Essential (primary) hypertension: Secondary | ICD-10-CM | POA: Diagnosis not present

## 2019-11-17 DIAGNOSIS — I739 Peripheral vascular disease, unspecified: Secondary | ICD-10-CM

## 2019-11-17 DIAGNOSIS — E876 Hypokalemia: Secondary | ICD-10-CM

## 2019-11-17 NOTE — Patient Instructions (Addendum)
After Visit Summary:  We will be checking the following labs today - BMET & CBC   Medication Instructions:    Continue with your current medicines.    If you need a refill on your cardiac medications before your next appointment, please call your pharmacy.     Testing/Procedures To Be Arranged:  N/A  Follow-Up:   See [place] in about a month to talk about further cardiac testing    At [facility], you and your health needs are our priority.  As part of our continuing mission to provide you with exceptional heart care, we have created designated Provider Care Teams.  These Care Teams include your primary Cardiologist (physician) and Advanced Practice Providers (APPs -  Physician Assistants and Nurse Practitioners) who all work together to provide you with the care you need, when you need it.  Special Instructions:  . Stay safe, wash your hands for at least 20 seconds and wear a mask when needed.  . It was good to talk with you today.    Call the [place] office at [phone] if you have any questions, problems or concerns.

## 2019-11-18 LAB — BASIC METABOLIC PANEL
BUN/Creatinine Ratio: 11 (ref 9–23)
BUN: 11 mg/dL (ref 6–24)
CO2: 26 mmol/L (ref 20–29)
Calcium: 8.6 mg/dL — ABNORMAL LOW (ref 8.7–10.2)
Chloride: 101 mmol/L (ref 96–106)
Creatinine, Ser: 1 mg/dL (ref 0.57–1.00)
GFR calc Af Amer: 72 mL/min/{1.73_m2} (ref >59)
GFR calc non Af Amer: 62 mL/min/{1.73_m2} (ref >59)
Glucose: 186 mg/dL — ABNORMAL HIGH (ref 65–99)
Potassium: 3.9 mmol/L (ref 3.5–5.2)
Sodium: 139 mmol/L (ref 134–144)

## 2019-11-18 LAB — CBC
Hematocrit: 37.8 % (ref 34.0–46.6)
Hemoglobin: 12 g/dL (ref 11.1–15.9)
MCH: 26.4 pg — ABNORMAL LOW (ref 26.6–33.0)
MCHC: 31.7 g/dL (ref 31.5–35.7)
MCV: 83 fL (ref 79–97)
Platelets: 431 10*3/uL (ref 150–450)
RBC: 4.54 x10E6/uL (ref 3.77–5.28)
RDW: 17.5 % — ABNORMAL HIGH (ref 11.7–15.4)
WBC: 7.6 10*3/uL (ref 3.4–10.8)

## 2019-11-27 ENCOUNTER — Other Ambulatory Visit: Payer: Self-pay

## 2019-11-27 ENCOUNTER — Other Ambulatory Visit: Payer: Self-pay | Admitting: Vascular Surgery

## 2019-11-27 ENCOUNTER — Ambulatory Visit (INDEPENDENT_AMBULATORY_CARE_PROVIDER_SITE_OTHER): Payer: Self-pay | Admitting: Physician Assistant

## 2019-11-27 ENCOUNTER — Ambulatory Visit (HOSPITAL_COMMUNITY)
Admission: RE | Admit: 2019-11-27 | Discharge: 2019-11-27 | Disposition: A | Discharge status: Home / Self Care | Payer: Medicare Other | Source: Ambulatory Visit | Attending: Vascular Surgery | Admitting: Vascular Surgery

## 2019-11-27 ENCOUNTER — Ambulatory Visit (INDEPENDENT_AMBULATORY_CARE_PROVIDER_SITE_OTHER)
Admission: RE | Admit: 2019-11-27 | Discharge: 2019-11-27 | Disposition: A | Discharge status: Home / Self Care | Payer: Medicare Other | Source: Ambulatory Visit | Attending: Vascular Surgery | Admitting: Vascular Surgery

## 2019-11-27 VITALS — BP 129/81 | HR 104 | Temp 96.7°F | Resp 20 | Ht 65.0 in | Wt 203.9 lb

## 2019-11-27 DIAGNOSIS — I739 Peripheral vascular disease, unspecified: Secondary | ICD-10-CM

## 2019-11-27 DIAGNOSIS — I998 Other disorder of circulatory system: Secondary | ICD-10-CM

## 2019-11-27 NOTE — H&P (View-Only) (Signed)
    Postoperative Visit   History of Present Illness   Morgan Beasley is a 59 y.o. year old female who presents for postoperative follow-up for left common femoral to below the knee popliteal bypass with vein by Dr. Donzetta Matters on 10/14/2019 due to critical limb ischemia with tissue loss.  Patient is still having generalized pain in left leg likely related to incisions however states she is walking much better and believes her wounds of her left foot are healing.  She still has a physical therapist come to her house to 3 days a week.  She believes her incisions are healing well.  She denies any significant rest pain in her left foot.    For VQI Use Only   PRE-ADM LIVING: Home  AMB STATUS: Ambulatory with Assistance   Physical Examination   Vitals:   11/27/19 1214  BP: (!) 129/81  Pulse: 104  Resp: 20  Temp: (!) 96.7 F (35.9 C)  TempSrc: Temporal  SpO2: 98%  Weight: (!) 203 lb 14.4 oz (92.5 kg)  Height: 5\' 5"  (1.651 m)    LLE: Left groin incision well-healed; proximalmost vein harvest incision with small open area however healing well; wound on the dorsal aspect of foot is dry without surrounding erythema; AT signal by Doppler   Medical Decision Making   Morgan Beasley is a 59 y.o. year old female who presents s/p left common femoral to multiple teal bypass with vein 6 weeks postop.  . Left foot is warm and well-perfused and dorsal foot wound appears to be healing . Arterial duplex however demonstrates monophasic and low flow through the graft with out able to visualize distal anastomosis; these are concerning findings given that patient is only 6 weeks out from surgery . I encouraged the patient to continue to walk as much as possible and participate with physical therapy . She will be scheduled for an aortogram with left lower extremity runoff on 12/08/2019 with Dr. Donzetta Matters . If her wound worsens or if she experiences rest pain in left foot she was instructed to call the  office   Dagoberto Ligas PA-C [facility] Office: [phone]  Clinic MD: Oneida Alar

## 2019-11-27 NOTE — Progress Notes (Signed)
    Postoperative Visit   History of Present Illness   Lakysha Kossman is a 59 y.o. year old female who presents for postoperative follow-up for left common femoral to below the knee popliteal bypass with vein by Dr. Donzetta Matters on 10/14/2019 due to critical limb ischemia with tissue loss.  Patient is still having generalized pain in left leg likely related to incisions however states she is walking much better and believes her wounds of her left foot are healing.  She still has a physical therapist come to her house to 3 days a week.  She believes her incisions are healing well.  She denies any significant rest pain in her left foot.    For VQI Use Only   PRE-ADM LIVING: Home  AMB STATUS: Ambulatory with Assistance   Physical Examination   Vitals:   11/27/19 1214  BP: (!) 129/81  Pulse: 104  Resp: 20  Temp: (!) 96.7 F (35.9 C)  TempSrc: Temporal  SpO2: 98%  Weight: (!) 203 lb 14.4 oz (92.5 kg)  Height: 5\' 5"  (1.651 m)    LLE: Left groin incision well-healed; proximalmost vein harvest incision with small open area however healing well; wound on the dorsal aspect of foot is dry without surrounding erythema; AT signal by Doppler   Medical Decision Making   Almee Pelphrey is a 59 y.o. year old female who presents s/p left common femoral to multiple teal bypass with vein 6 weeks postop.  . Left foot is warm and well-perfused and dorsal foot wound appears to be healing . Arterial duplex however demonstrates monophasic and low flow through the graft with out able to visualize distal anastomosis; these are concerning findings given that patient is only 6 weeks out from surgery . I encouraged the patient to continue to walk as much as possible and participate with physical therapy . She will be scheduled for an aortogram with left lower extremity runoff on 12/08/2019 with Dr. Donzetta Matters . If her wound worsens or if she experiences rest pain in left foot she was instructed to call the  office   Dagoberto Ligas PA-C [facility] Office: [phone]  Clinic MD: Oneida Alar

## 2019-12-08 ENCOUNTER — Other Ambulatory Visit: Payer: Self-pay

## 2019-12-08 ENCOUNTER — Encounter (HOSPITAL_COMMUNITY)
Admission: RE | Disposition: A | Discharge status: Home / Self Care | Payer: Self-pay | Source: Home / Self Care | Attending: Vascular Surgery

## 2019-12-08 ENCOUNTER — Ambulatory Visit (HOSPITAL_COMMUNITY)
Admission: RE | Admit: 2019-12-08 | Discharge: 2019-12-08 | Disposition: A | Discharge status: Home / Self Care | Payer: Medicare Other | Attending: Vascular Surgery | Admitting: Vascular Surgery

## 2019-12-08 ENCOUNTER — Encounter (HOSPITAL_COMMUNITY): Payer: Self-pay | Admitting: Vascular Surgery

## 2019-12-08 DIAGNOSIS — L97529 Non-pressure chronic ulcer of other part of left foot with unspecified severity: Secondary | ICD-10-CM | POA: Diagnosis not present

## 2019-12-08 DIAGNOSIS — Z20822 Contact with and (suspected) exposure to covid-19: Secondary | ICD-10-CM | POA: Insufficient documentation

## 2019-12-08 DIAGNOSIS — I70245 Atherosclerosis of native arteries of left leg with ulceration of other part of foot: Secondary | ICD-10-CM

## 2019-12-08 HISTORY — PX: ABDOMINAL AORTOGRAM W/LOWER EXTREMITY: CATH118223

## 2019-12-08 LAB — POCT I-STAT, CHEM 8
BUN: 12 mg/dL (ref 6–20)
Calcium, Ion: 1.13 mmol/L — ABNORMAL LOW (ref 1.15–1.40)
Chloride: 102 mmol/L (ref 98–111)
Creatinine, Ser: 1 mg/dL (ref 0.44–1.00)
Glucose, Bld: 98 mg/dL (ref 70–99)
HCT: 39 % (ref 36.0–46.0)
Hemoglobin: 13.3 g/dL (ref 12.0–15.0)
Potassium: 3.7 mmol/L (ref 3.5–5.1)
Sodium: 142 mmol/L (ref 135–145)
TCO2: 27 mmol/L (ref 22–32)

## 2019-12-08 LAB — SARS CORONAVIRUS 2 BY RT PCR (HOSPITAL ORDER, PERFORMED IN ~~LOC~~ HOSPITAL LAB): SARS Coronavirus 2: NEGATIVE

## 2019-12-08 LAB — GLUCOSE, CAPILLARY: Glucose-Capillary: 92 mg/dL (ref 70–99)

## 2019-12-08 SURGERY — ABDOMINAL AORTOGRAM W/LOWER EXTREMITY
Anesthesia: LOCAL | Laterality: Bilateral

## 2019-12-08 MED ORDER — SODIUM CHLORIDE 0.9 % IV SOLN: INTRAVENOUS | Status: DC

## 2019-12-08 MED ORDER — SODIUM CHLORIDE 0.9% FLUSH: 3.0000 mL | Freq: Two times a day (BID) | INTRAVENOUS | Status: DC

## 2019-12-08 MED ORDER — LIDOCAINE HCL (PF) 1 % IJ SOLN
INTRAMUSCULAR | Status: DC | PRN
  Administered 2019-12-08: 15 mL via INTRADERMAL

## 2019-12-08 MED ORDER — SODIUM CHLORIDE 0.9 % IV SOLN: 250.0000 mL | INTRAVENOUS | Status: DC | PRN

## 2019-12-08 MED ORDER — IODIXANOL 320 MG/ML IV SOLN
INTRAVENOUS | Status: DC | PRN
  Administered 2019-12-08: 150 mL via INTRA_ARTERIAL

## 2019-12-08 MED ORDER — SODIUM CHLORIDE 0.9% FLUSH: 3.0000 mL | INTRAVENOUS | Status: DC | PRN

## 2019-12-08 MED ORDER — LABETALOL HCL 5 MG/ML IV SOLN: 10.0000 mg | INTRAVENOUS | Status: DC | PRN

## 2019-12-08 MED ORDER — SODIUM CHLORIDE 0.9 % IV SOLN: INTRAVENOUS | Status: AC

## 2019-12-08 MED ORDER — HEPARIN (PORCINE) IN NACL 1000-0.9 UT/500ML-% IV SOLN
INTRAVENOUS | Status: DC | PRN
  Administered 2019-12-08 (×2): 500 mL

## 2019-12-08 MED ORDER — HEPARIN (PORCINE) IN NACL 1000-0.9 UT/500ML-% IV SOLN
INTRAVENOUS | Status: AC
  Filled 2019-12-08: qty 1000

## 2019-12-08 MED ORDER — MIDAZOLAM HCL 2 MG/2ML IJ SOLN
INTRAMUSCULAR | Status: DC | PRN
  Administered 2019-12-08: 1 mg via INTRAVENOUS

## 2019-12-08 MED ORDER — ACETAMINOPHEN 325 MG PO TABS: 650.0000 mg | ORAL_TABLET | ORAL | Status: DC | PRN

## 2019-12-08 MED ORDER — FENTANYL CITRATE (PF) 100 MCG/2ML IJ SOLN
INTRAMUSCULAR | Status: AC
  Filled 2019-12-08: qty 2

## 2019-12-08 MED ORDER — ONDANSETRON HCL 4 MG/2ML IJ SOLN: 4.0000 mg | Freq: Four times a day (QID) | INTRAMUSCULAR | Status: DC | PRN

## 2019-12-08 MED ORDER — MIDAZOLAM HCL 5 MG/5ML IJ SOLN
INTRAMUSCULAR | Status: AC
  Filled 2019-12-08: qty 5

## 2019-12-08 MED ORDER — LIDOCAINE HCL (PF) 1 % IJ SOLN
INTRAMUSCULAR | Status: AC
  Filled 2019-12-08: qty 30

## 2019-12-08 MED ORDER — HYDRALAZINE HCL 20 MG/ML IJ SOLN: 5.0000 mg | INTRAMUSCULAR | Status: DC | PRN

## 2019-12-08 MED ORDER — FENTANYL CITRATE (PF) 100 MCG/2ML IJ SOLN
INTRAMUSCULAR | Status: DC | PRN
  Administered 2019-12-08: 25 ug via INTRAVENOUS

## 2019-12-08 SURGICAL SUPPLY — 13 items
BAG SNAP BAND KOVER 36X36 (MISCELLANEOUS) ×4 IMPLANT
CATH OMNI FLUSH 5F 65CM (CATHETERS) ×2 IMPLANT
COVER DOME SNAP 22 D (MISCELLANEOUS) ×2 IMPLANT
KIT MICROPUNCTURE NIT STIFF (SHEATH) ×2 IMPLANT
KIT PV (KITS) ×2 IMPLANT
SHEATH PINNACLE 5F 10CM (SHEATH) ×2 IMPLANT
STOPCOCK MORSE 400PSI 3WAY (MISCELLANEOUS) ×2 IMPLANT
SYR MEDRAD MARK 7 150ML (SYRINGE) ×2 IMPLANT
TRANSDUCER W/STOPCOCK (MISCELLANEOUS) ×2 IMPLANT
TRAY PV CATH (CUSTOM PROCEDURE TRAY) ×2 IMPLANT
TUBING CIL FLEX 10 FLL-RA (TUBING) ×2 IMPLANT
TUBING CONTRAST HIGH PRESS 48 (TUBING) ×2 IMPLANT
WIRE BENTSON .035X145CM (WIRE) ×2 IMPLANT

## 2019-12-08 NOTE — Op Note (Signed)
    Patient name: Morgan Beasley MRN: [id number] DOB: [birth year] Sex: female  12/08/2019 Pre-operative Diagnosis: Left lower extremity pain with foot ulceration Post-operative diagnosis:  Same Surgeon:  Erlene Quan C. Donzetta Matters, MD Procedure Performed: 1.  Ultrasound-guided cannulation right common femoral artery 2.  Aortogram with bilateral lower extremity runoff 3.  Moderate sedation with fentanyl and Versed for 31 minutes   Indications: 59 year old female presented after having occluded left SFA stent had sloughing of her left foot.  This is healing well but she has persistent pain ABIs were less than perfect and toe pressure was less than 50.  She was therefore indicated for repeat angiography with possible intervention on the left lower extremity bypass.  Findings: The aorta and iliac segments are free of flow-limiting stenosis.  The left side which is the site of interest the bypass takes off from the common femoral artery this is a patent vein all the way to the below-knee popliteal artery.  This is patent without any flow-limiting stenosis including both anastomoses.  Distally she does have disease in the proximal anterior tibial artery this is the main vessel running off but appears to end at the ankle she also has what appears to be a posterior tibial artery which is diminutive only runs to the ankle.  No intervention was undertaken given that these vessels do have runoff are quite diminutive and patient is healing her wounds at this time.  On the right side her SFA is occluded she reconstitutes the popliteal artery she has runoff on that side via anterior tibial and posterior tibial but again these and at the ankle does not have significant flow into the foot.  We will continue to monitor patient's wounds.  She needs urgent smoking cessation.  She will be high risk for amputation if she cannot heal her wound.   Procedure:  The patient was identified in the holding area and taken to room 8.   The patient was then placed supine on the table and prepped and draped in the usual sterile fashion.  A time out was called.  Ultrasound was used to evaluate the right common femoral artery which was noted to be patent although calcified.  There is anesthetized 1% lidocaine cannulated with direct ultrasound visualization micropuncture needle followed wire and sheath.  Images saved of her record of ultrasound.  We placed a 5 French sheath over a Bentson wire.  Omni catheter was placed to the level of L1 aortogram was performed followed by lower extremity angiography.  With the above findings I elected not to intervene.  Catheter was removed over wire.  I did not place a closure device due to the heavy calcification in her groin this will be pulled in postoperative holding.  She did tolerate procedure without immediate complication.   Contrast: 150 cc  [name] C. Donzetta Matters, MD [facility] Office: [phone] Pager: [phone]

## 2019-12-08 NOTE — Interval H&P Note (Signed)
History and Physical Interval Note:  12/08/2019 7:18 AM  Morgan Beasley  has presented today for surgery, with the diagnosis of PVD.  The various methods of treatment have been discussed with the patient and family. After consideration of risks, benefits and other options for treatment, the patient has consented to  Procedure(s): ABDOMINAL AORTOGRAM W/LOWER EXTREMITY (Left) as a surgical intervention.  The patient's history has been reviewed, patient examined, no change in status, stable for surgery.  I have reviewed the patient's chart and labs.  Questions were answered to the patient's satisfaction.     Servando Snare, MD

## 2019-12-08 NOTE — Progress Notes (Signed)
Site area: rt groin fa sheath pulled by Carlean Jews Site Prior to Removal:  Level 0 Pressure Applied For: 20 minutes Manual:   yes Patient Status During Pull:  stable Post Pull Site:  Level 0 Post Pull Instructions Given:  yes Post Pull Pulses Present: rt dp dopplered Dressing Applied:  Gauze and tegaderm Bedrest begins @ 0165 Comments:

## 2019-12-08 NOTE — Discharge Instructions (Signed)
Femoral Site Care This sheet gives you information about how to care for yourself after your procedure. Your health care provider may also give you more specific instructions. If you have problems or questions, contact your health care provider. What can I expect after the procedure? After the procedure, it is common to have:  Bruising that usually fades within 1-2 weeks.  Tenderness at the site. Follow these instructions at home: Wound care  Follow instructions from your health care provider about how to take care of your insertion site. Make sure you: ? Wash your hands with soap and water before you change your bandage (dressing). If soap and water are not available, use hand sanitizer. ? Change your dressing as told by your health care provider. ? Leave stitches (sutures), skin glue, or adhesive strips in place. These skin closures may need to stay in place for 2 weeks or longer. If adhesive strip edges start to loosen and curl up, you may trim the loose edges. Do not remove adhesive strips completely unless your health care provider tells you to do that.  Do not take baths, swim, or use a hot tub until your health care provider approves.  You may shower 24-48 hours after the procedure or as told by your health care provider. ? Gently wash the site with plain soap and water. ? Pat the area dry with a clean towel. ? Do not rub the site. This may cause bleeding.  Do not apply powder or lotion to the site. Keep the site clean and dry.  Check your femoral site every day for signs of infection. Check for: ? Redness, swelling, or pain. ? Fluid or blood. ? Warmth. ? Pus or a bad smell. Activity  For the first 2-3 days after your procedure, or as long as directed: ? Avoid climbing stairs as much as possible. ? Do not squat.  Do not lift anything that is heavier than 10 lb (4.5 kg), or the limit that you are told, until your health care provider says that it is safe.  Rest as  directed. ? Avoid sitting for a long time without moving. Get up to take short walks every 1-2 hours.  Do not drive for 24 hours if you were given a medicine to help you relax (sedative). General instructions  Take over-the-counter and prescription medicines only as told by your health care provider.  Keep all follow-up visits as told by your health care provider. This is important. Contact a health care provider if you have:  A fever or chills.  You have redness, swelling, or pain around your insertion site. Get help right away if:  The catheter insertion area swells very fast.  You pass out.  You suddenly start to sweat or your skin gets clammy.  The catheter insertion area is bleeding, and the bleeding does not stop when you hold steady pressure on the area.  The area near or just beyond the catheter insertion site becomes pale, cool, tingly, or numb. These symptoms may represent a serious problem that is an emergency. Do not wait to see if the symptoms will go away. Get medical help right away. Call your local emergency services (911 in the [place] Do not drive yourself to the hospital. Summary  After the procedure, it is common to have bruising that usually fades within 1-2 weeks.  Check your femoral site every day for signs of infection.  Do not lift anything that is heavier than 10 lb (4.5 kg), or the   limit that you are told, until your health care provider says that it is safe. This information is not intended to replace advice given to you by your health care provider. Make sure you discuss any questions you have with your health care provider. Document Revised: 04/30/2017 Document Reviewed: 04/30/2017 Elsevier Patient Education  2020 Elsevier Inc.  

## 2019-12-08 NOTE — Progress Notes (Signed)
Discharge instructions reviewed with patient and family. Verbalized understanding. 

## 2019-12-19 ENCOUNTER — Encounter: Payer: Self-pay | Admitting: Internal Medicine

## 2019-12-19 ENCOUNTER — Other Ambulatory Visit: Payer: Self-pay

## 2019-12-19 ENCOUNTER — Ambulatory Visit (INDEPENDENT_AMBULATORY_CARE_PROVIDER_SITE_OTHER): Payer: Medicare Other | Admitting: Internal Medicine

## 2019-12-19 VITALS — BP 122/84 | HR 103 | Ht 66.0 in | Wt 199.4 lb

## 2019-12-19 DIAGNOSIS — R600 Localized edema: Secondary | ICD-10-CM | POA: Diagnosis not present

## 2019-12-19 DIAGNOSIS — I5042 Chronic combined systolic (congestive) and diastolic (congestive) heart failure: Secondary | ICD-10-CM

## 2019-12-19 MED ORDER — METOPROLOL SUCCINATE ER 50 MG PO TB24
50.0000 mg | ORAL_TABLET | Freq: Every day | ORAL | 3 refills | Status: DC
Start: 2019-12-19 — End: 2019-12-22

## 2019-12-19 NOTE — Patient Instructions (Signed)
Medication Instructions:  Your physician has recommended you make the following change in your medication:  1.) increase Toprol XL to 50 mg daily  *If you need a refill on your cardiac medications before your next appointment, please call your pharmacy*   Lab Work: Today: bmet, bnp, lipids  If you have labs (blood work) drawn today and your tests are completely normal, you will receive your results only by: Marland Kitchen MyChart Message (if you have MyChart) OR . A paper copy in the mail If you have any lab test that is abnormal or we need to change your treatment, we will call you to review the results.   Testing/Procedures: none   Follow-Up: At [facility], you and your health needs are our priority.  As part of our continuing mission to provide you with exceptional heart care, we have created designated Provider Care Teams.  These Care Teams include your primary Cardiologist (physician) and Advanced Practice Providers (APPs -  Physician Assistants and Nurse Practitioners) who all work together to provide you with the care you need, when you need it.  We recommend signing up for the patient portal called "MyChart".  Sign up information is provided on this After Visit Summary.  MyChart is used to connect with patients for Virtual Visits (Telemedicine).  Patients are able to view lab/test results, encounter notes, upcoming appointments, etc.  Non-urgent messages can be sent to your provider as well.   To learn more about what you can do with MyChart, go to [email or web address].    Your next appointment:   3 month(s)  The format for your next appointment:   In Person  Provider:   You may see Dorris Carnes, MD or one of the following Advanced Practice Providers on your designated Care Team:    Richardson Dopp, PA-C  Robbie Lis, [place]    Other Instructions

## 2019-12-19 NOTE — Progress Notes (Signed)
Cardiology Office Note   Date:  12/19/2019   ID:  Morgan Beasley, DOB 01-17-1961, MRN [id number]  PCP:  Morgan Sheer, NP  Cardiologist:   Dorris Carnes, MD   F/U of CAD     History of Present Illness: Morgan Beasley is a 59 y.o. female with a history of NSTEMI (in setting of cocaine use) nonobstructive CAD (50% LAD in 0865), diastolic CHF (admit May 7846; LVEF 50 to 55%), HTN, HL, PAD, DM Type II, gout, COPD, Tobacco abuse and ETOH abuse, breast CA (s/p R mastectomy) and OSA (on Bipap)    The pt was seen for preop cardiac eval before L fem/pop surgery   Myovue done at [facility].  This showed a large infarct and/or ext soft tissue attenuation (breast)  No ischemia   LVEF 31% on myovue  Echo done and reported as LVEF 40 to 45%      The pt was cleared for surgery with plans for f/u as outpt for possible L heart cath since LVEF down from May 2021  Since discharge the pt was seen once in clinic by L Gerhardt  The pt denies CP    Breathing is stable   Activity is still limited by legs     Current Meds  Medication Sig  . albuterol (PROAIR HFA) 108 (90 Base) MCG/ACT inhaler Inhale 2 puffs into the lungs every 6 (six) hours as needed for shortness of breath.   . allopurinol (ZYLOPRIM) 300 MG tablet Take 300 mg by mouth daily.  Marland Kitchen amitriptyline (ELAVIL) 10 MG tablet Take 5 tablets (50 mg total) by mouth at bedtime.  Marland Kitchen amitriptyline (ELAVIL) 100 MG tablet Take 200 mg by mouth at bedtime.  Marland Kitchen atorvastatin (LIPITOR) 40 MG tablet Take 40 mg by mouth daily.  . budesonide-formoterol (SYMBICORT) 160-4.5 MCG/ACT inhaler Inhale 2 puffs into the lungs 2 (two) times daily.  . clopidogrel (PLAVIX) 75 MG tablet Take 1 tablet (75 mg total) by mouth daily.  . dapagliflozin propanediol (FARXIGA) 10 MG TABS tablet Take 1 tablet (10 mg total) by mouth daily before breakfast.  . gabapentin (NEURONTIN) 800 MG tablet Take 800 mg by mouth 3 (three) times daily.  Marland Kitchen glimepiride (AMARYL) 4 MG tablet  Take 8 mg by mouth daily with breakfast.   . HYDROcodone-acetaminophen (NORCO/VICODIN) 5-325 MG tablet Take 1 tablet by mouth every 4 (four) hours as needed for moderate pain.  Marland Kitchen insulin glargine (LANTUS) 100 UNIT/ML injection Inject 0.4 mLs (40 Units total) into the skin daily. (Patient taking differently: Inject 80 Units into the skin daily. )  . insulin lispro (HUMALOG) 100 UNIT/ML injection Inject 7-14 Units into the skin 3 (three) times daily before meals.   . lipase/protease/amylase (CREON) 36000 UNITS CPEP capsule Take 2 capsule by mouth with each meal and 1 capsule by mouth with each snack (Patient taking differently: Take 36,000-72,000 Units by mouth See admin instructions. Take 72000 units by mouth with each meal and 36000 units by mouth with each snack)  . losartan (COZAAR) 50 MG tablet Take 50 mg by mouth daily.  . metoprolol succinate (TOPROL-XL) 25 MG 24 hr tablet Take 1 tablet (25 mg total) by mouth daily.  . Multiple Vitamins-Minerals (ADULT ONE DAILY GUMMIES) CHEW Chew 1 capsule by mouth daily.  Marland Kitchen NARCAN 4 MG/0.1ML LIQD nasal spray kit Place 1 spray into the nose as needed (opioid overdose).   Marland Kitchen oxyCODONE (OXY IR/ROXICODONE) 5 MG immediate release tablet Take 10 mg by mouth 2 (two)  times daily as needed for severe pain.   . potassium chloride (K-DUR) 10 MEQ tablet Take 10 mEq by mouth daily as needed (For cramps).   . Toremifene Citrate (FARESTON) 60 MG tablet Take 1 tablet (60 mg total) by mouth daily.     Allergies:   Nsaids, Tolmetin, Aspirin, and Tramadol   Past Medical History:  Diagnosis Date  . Alcohol dependence ([place])   . Anemia   . Anxiety   . Breast cancer ([place])   . Chronic combined systolic and diastolic CHF (congestive heart failure) ([place])   . Cigarette nicotine dependence   . CKD (chronic kidney disease), stage III   . Colon polyps   . COPD (chronic obstructive pulmonary disease) ([place])   . Diabetes mellitus without complication ([place])   . Diabetic neuropathy  ([place])   . Elevated lipase   . Gout   . Hyperlipemia   . Hypertension   . Insomnia   . Lymphedema   . Marijuana use   . Mild CAD 2016   a. NSTEMI 2016 in context of cocaine abuse, 50% RCA% at that time.  . OSA treated with BiPAP   . PAD (peripheral artery disease) ([place])    a. s/p L SFA stenting 08/2019. b. left fem-to-below-knee-popliteal bypass 09/2019.  Marland Kitchen Sleep apnea    wears BIPAP  . Ulcer of foot ([place])    right    Past Surgical History:  Procedure Laterality Date  . ABDOMINAL AORTOGRAM W/LOWER EXTREMITY N/A 09/26/2019   Procedure: ABDOMINAL AORTOGRAM W/LOWER EXTREMITY;  Surgeon: Angelia Mould, MD;  Location: [facility];  Service: Cardiovascular;  Laterality: N/A;  . ABDOMINAL AORTOGRAM W/LOWER EXTREMITY Bilateral 12/08/2019   Procedure: ABDOMINAL AORTOGRAM W/LOWER EXTREMITY;  Surgeon: Waynetta Sandy, MD;  Location: [facility];  Service: Cardiovascular;  Laterality: Bilateral;  . ABDOMINAL HYSTERECTOMY    . [facility]  . FEMORAL-POPLITEAL BYPASS GRAFT Left 10/14/2019   Procedure: BYPASS GRAFT LEFT FEMORAL-POPLITEAL ARTERY USING NONREVERSED SAPHENOUS VEIN;  Surgeon: Waynetta Sandy, MD;  Location: [facility];  Service: Vascular;  Laterality: Left;  Marland Kitchen MASTECTOMY Right April 2016  . PERIPHERAL VASCULAR INTERVENTION Left 09/26/2019   Procedure: PERIPHERAL VASCULAR INTERVENTION;  Surgeon: Angelia Mould, MD;  Location: [facility];  Service: Cardiovascular;  Laterality: Left;  superficial femoral     Social History:  The patient  reports that she has been smoking cigarettes. She has never used smokeless tobacco. She reports previous alcohol use. She reports previous drug use. Drug: Marijuana.   Family History:  The patient's family history includes Breast cancer in her maternal grandmother and paternal grandmother; Diabetes in an other family member; Heart disease in an other family member; Stroke in her son.     ROS:  Please see the history of present illness. All other systems are reviewed and  Negative to the above problem except as noted.    PHYSICAL EXAM: VS:  BP 122/84   Pulse (!) 103   Ht 5' 6"  (1.676 m)   Wt 199 lb 6.4 oz (90.4 kg)   SpO2 97%   BMI 32.18 kg/m   GEN: Well nourished, well developed, in no acute distress  HEENT: normal  Neck: no JVD Cardiac: RRR; no murmurs  Triv LE  edema  Respiratory:  clear to auscultation bilaterally GI: soft, nontender, nondistended, + BS  No hepatomegaly   Skin: warm and dry,   Sore on top of L foot  Neuro:  Strength and sensation are intact Psych: euthymic mood, full affect   EKG:  EKG is not ordered today.   Lipid Panel    Component Value Date/Time   CHOL 196 10/11/2019 1101   TRIG 112 10/11/2019 1101   HDL 51 10/11/2019 1101   CHOLHDL 3.8 10/11/2019 1101   VLDL 22 10/11/2019 1101   LDLCALC 123 (H) 10/11/2019 1101      Wt Readings from Last 3 Encounters:  12/19/19 199 lb 6.4 oz (90.4 kg)  12/08/19 198 lb (89.8 kg)  11/27/19 (!) 203 lb 14.4 oz (92.5 kg)      ASSESSMENT AND PLAN:  1  CAD  Moderate CAD at cath in 2016  Hx cocaine use    Myovue done this spring as part of a preop eval   Showed extensive scar  Anteriorly and laterally and apically      I have reviewed images   It is a poor quality scan  Echo at that time LVEF was reported 40 t o45%   I have reviewed these images and compared to echo in May 2021   I am not convinced there was a signif change  Septal, lateral and anterior wall thicken.   Given that she is not having CP and volume status is OK I would continue medical Rx   I would not plan / recomm L heart cath now.  On exam today her HR is a little high  Will recomm she increase Toprol XL to 50 mg per day  2  PAD   Will continue to follow with B Donzetta [facility]  3   Chronic diastlolic CHF   Keep on same meds   Check BNP  4   HL  Check lipids today     F/U in clinic in 3 months    Signed, Dorris Carnes, MD  12/19/2019  3:10 PM    [facility] [place], [place], [place]  [address] Phone: [phone]; Fax: [phone]

## 2019-12-20 LAB — BASIC METABOLIC PANEL
BUN/Creatinine Ratio: 17 (ref 9–23)
BUN: 20 mg/dL (ref 6–24)
CO2: 28 mmol/L (ref 20–29)
Calcium: 8.9 mg/dL (ref 8.7–10.2)
Chloride: 104 mmol/L (ref 96–106)
Creatinine, Ser: 1.18 mg/dL — ABNORMAL HIGH (ref 0.57–1.00)
GFR calc Af Amer: 59 mL/min/{1.73_m2} — ABNORMAL LOW (ref >59)
GFR calc non Af Amer: 51 mL/min/{1.73_m2} — ABNORMAL LOW (ref >59)
Glucose: 77 mg/dL (ref 65–99)
Potassium: 4 mmol/L (ref 3.5–5.2)
Sodium: 144 mmol/L (ref 134–144)

## 2019-12-20 LAB — LIPID PANEL
Chol/HDL Ratio: 4.4 ratio (ref 0.0–4.4)
Cholesterol, Total: 266 mg/dL — ABNORMAL HIGH (ref 100–199)
HDL: 60 mg/dL (ref >39)
LDL Chol Calc (NIH): 167 mg/dL — ABNORMAL HIGH (ref 0–99)
Triglycerides: 211 mg/dL — ABNORMAL HIGH (ref 0–149)
VLDL Cholesterol Cal: 39 mg/dL (ref 5–40)

## 2019-12-20 LAB — PRO B NATRIURETIC PEPTIDE: NT-Pro BNP: 1148 pg/mL — ABNORMAL HIGH (ref 0–287)

## 2019-12-22 ENCOUNTER — Telehealth: Payer: Self-pay | Admitting: *Deleted

## 2019-12-22 ENCOUNTER — Telehealth: Payer: Self-pay | Admitting: Internal Medicine

## 2019-12-22 DIAGNOSIS — I5042 Chronic combined systolic (congestive) and diastolic (congestive) heart failure: Secondary | ICD-10-CM

## 2019-12-22 DIAGNOSIS — E782 Mixed hyperlipidemia: Secondary | ICD-10-CM

## 2019-12-22 MED ORDER — ROSUVASTATIN CALCIUM 40 MG PO TABS
40.0000 mg | ORAL_TABLET | Freq: Every day | ORAL | 3 refills | Status: DC
Start: 2019-12-22 — End: 2020-03-18

## 2019-12-22 MED ORDER — EZETIMIBE 10 MG PO TABS
10.0000 mg | ORAL_TABLET | Freq: Every day | ORAL | 3 refills | Status: DC
Start: 2019-12-22 — End: 2020-03-18

## 2019-12-22 MED ORDER — METOPROLOL SUCCINATE ER 50 MG PO TB24: 50.0000 mg | ORAL_TABLET | Freq: Every day | ORAL | 3 refills | Status: DC

## 2019-12-22 NOTE — Telephone Encounter (Signed)
Reviewed results and recommendations w patient. Adv to keep daily fluid intake to under 1500 ml (=50.7 oz, or 3 bottles 16.9 oz bottles of water). Adv to limit sodium/salt. Adv of med changes in regard to lipids and plan on return for blood work Oct 11. She adv that metoprolol succinate 50 mg was sent to [facility] and she wishes it to be at [facility] on [address].

## 2019-12-22 NOTE — Telephone Encounter (Signed)
Dr. Harrington Challenger did not prescribe oxycodone.  She should contact the prescribing provider.

## 2019-12-22 NOTE — Telephone Encounter (Signed)
Follow up:     Patient returning call back concering her medication. Please call patient.

## 2019-12-22 NOTE — Telephone Encounter (Signed)
Called pt and pt stated that she called the wrong doctor. I advised pt to give our office a call back if she has any other problems, questions or concerns. Pt verbalized understanding.
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
## 2019-12-22 NOTE — Telephone Encounter (Signed)
Pt requesting a refill on oxycodone. Would Dr. Harrington Challenger like to refill this medication? Please address

## 2019-12-22 NOTE — Telephone Encounter (Signed)
-----   Message from Fay Records, MD sent at 12/21/2019  8:57 PM EDT ----- Electrolytes and kidney function are OK   Fluid is up    Watch / limit salt    I would limit fluids to 1.5 L per day Repeat BMET and BNP in 6 wks    Lipids :   LDL is too high   Would switch to Crestor 40 and add 10 mg Zetia (take Zetia with largest meal   Goal of LDL Is less than 70  F/U lipids in 8 wks  with AST

## 2020-01-02 ENCOUNTER — Other Ambulatory Visit: Payer: Self-pay

## 2020-01-02 DIAGNOSIS — I998 Other disorder of circulatory system: Secondary | ICD-10-CM

## 2020-01-02 DIAGNOSIS — I739 Peripheral vascular disease, unspecified: Secondary | ICD-10-CM

## 2020-01-16 ENCOUNTER — Ambulatory Visit (INDEPENDENT_AMBULATORY_CARE_PROVIDER_SITE_OTHER)
Admit: 2020-01-16 | Discharge: 2020-01-16 | Disposition: A | Discharge status: Home / Self Care | Payer: Medicare Other | Attending: Vascular Surgery | Admitting: Vascular Surgery

## 2020-01-16 ENCOUNTER — Ambulatory Visit (INDEPENDENT_AMBULATORY_CARE_PROVIDER_SITE_OTHER): Payer: Medicare Other | Admitting: Vascular Surgery

## 2020-01-16 ENCOUNTER — Ambulatory Visit (HOSPITAL_COMMUNITY)
Admission: RE | Admit: 2020-01-16 | Discharge: 2020-01-16 | Disposition: A | Discharge status: Home / Self Care | Payer: Medicare Other | Source: Ambulatory Visit | Attending: Vascular Surgery | Admitting: Vascular Surgery

## 2020-01-16 ENCOUNTER — Other Ambulatory Visit: Payer: Self-pay

## 2020-01-16 ENCOUNTER — Encounter: Payer: Self-pay | Admitting: Vascular Surgery

## 2020-01-16 VITALS — BP 158/95 | HR 105 | Temp 98.0°F | Resp 20 | Ht 66.0 in | Wt 207.0 lb

## 2020-01-16 DIAGNOSIS — I739 Peripheral vascular disease, unspecified: Secondary | ICD-10-CM | POA: Diagnosis present

## 2020-01-16 DIAGNOSIS — I998 Other disorder of circulatory system: Secondary | ICD-10-CM | POA: Diagnosis not present

## 2020-01-16 NOTE — Progress Notes (Signed)
Patient ID: Morgan Beasley, female   DOB: 02-Jul-1960, 59 y.o.   MRN: [id number]  Reason for Consult: Post-op Follow-up   Referred by Sonia Side., FNP  Subjective:     HPI:  Morgan Beasley is a 59 y.o. female underwent left femoral to popliteal artery bypass with vein for sloughing of her left foot.  She now has a persistent wound on the left foot.  She did undergo angiography but at the time her wound was improving no intervention was undertaken.  She appeared to have intertibial posterior tibial disease at the time.  She has persistent pain in the left foot.  She does walk without any assistance but is very slow due to the pain in the left foot.  She also has pain in the right lower extremity has never had intervention does not have any wounds at this time.  Past Medical History:  Diagnosis Date  . Alcohol dependence ([place])   . Anemia   . Anxiety   . Breast cancer ([place])   . Chronic combined systolic and diastolic CHF (congestive heart failure) ([place])   . Cigarette nicotine dependence   . CKD (chronic kidney disease), stage III   . Colon polyps   . COPD (chronic obstructive pulmonary disease) ([place])   . Diabetes mellitus without complication ([place])   . Diabetic neuropathy ([place])   . Elevated lipase   . Gout   . Hyperlipemia   . Hypertension   . Insomnia   . Lymphedema   . Marijuana use   . Mild CAD 2016   a. NSTEMI 2016 in context of cocaine abuse, 50% RCA% at that time.  . OSA treated with BiPAP   . PAD (peripheral artery disease) ([place])    a. s/p L SFA stenting 08/2019. b. left fem-to-below-knee-popliteal bypass 09/2019.  Marland Kitchen Sleep apnea    wears BIPAP  . Ulcer of foot ([place])    right   Family History  Problem Relation Age of Onset  . Diabetes Other   . Heart disease Other   . Breast cancer Maternal Grandmother   . Breast cancer Paternal Grandmother   . Stroke Son   . Colon cancer Neg Hx   . Esophageal cancer Neg Hx   . Rectal cancer Neg Hx   . Stomach  cancer Neg Hx    Past Surgical History:  Procedure Laterality Date  . ABDOMINAL AORTOGRAM W/LOWER EXTREMITY N/A 09/26/2019   Procedure: ABDOMINAL AORTOGRAM W/LOWER EXTREMITY;  Surgeon: Angelia Mould, MD;  Location: [facility];  Service: Cardiovascular;  Laterality: N/A;  . ABDOMINAL AORTOGRAM W/LOWER EXTREMITY Bilateral 12/08/2019   Procedure: ABDOMINAL AORTOGRAM W/LOWER EXTREMITY;  Surgeon: Waynetta Sandy, MD;  Location: [facility];  Service: Cardiovascular;  Laterality: Bilateral;  . ABDOMINAL HYSTERECTOMY    . [facility]  . FEMORAL-POPLITEAL BYPASS GRAFT Left 10/14/2019   Procedure: BYPASS GRAFT LEFT FEMORAL-POPLITEAL ARTERY USING NONREVERSED SAPHENOUS VEIN;  Surgeon: Waynetta Sandy, MD;  Location: [facility];  Service: Vascular;  Laterality: Left;  Marland Kitchen MASTECTOMY Right April 2016  . PERIPHERAL VASCULAR INTERVENTION Left 09/26/2019   Procedure: PERIPHERAL VASCULAR INTERVENTION;  Surgeon: Angelia Mould, MD;  Location: [facility];  Service: Cardiovascular;  Laterality: Left;  superficial femoral    Short Social History:  Social History   Tobacco Use  . Smoking status: Light Tobacco Smoker    Types: Cigarettes    Last attempt to quit: 06/04/2019  Years since quitting: 0.6  . Smokeless tobacco: Never Used  Substance Use Topics  . Alcohol use: Not Currently    Comment: Beer - "on the weekends hanging out, maybe 2 or 3"     Allergies  Allergen Reactions  . Nsaids Other (See Comments)    Pancreatitis  . Tolmetin Other (See Comments)    Pancreatitis  . Aspirin Other (See Comments)    "Makes my pancreas act up"   . Tramadol Swelling    Current Outpatient Medications  Medication Sig Dispense Refill  . albuterol (PROAIR HFA) 108 (90 Base) MCG/ACT inhaler Inhale 2 puffs into the lungs every 6 (six) hours as needed for shortness of breath.     . allopurinol (ZYLOPRIM) 300 MG tablet Take 300 mg by mouth  daily.    Marland Kitchen amitriptyline (ELAVIL) 10 MG tablet Take 5 tablets (50 mg total) by mouth at bedtime. 150 tablet 3  . amitriptyline (ELAVIL) 100 MG tablet Take 200 mg by mouth at bedtime.    . budesonide-formoterol (SYMBICORT) 160-4.5 MCG/ACT inhaler Inhale 2 puffs into the lungs 2 (two) times daily.    . clopidogrel (PLAVIX) 75 MG tablet Take 1 tablet (75 mg total) by mouth daily. 30 tablet 11  . dapagliflozin propanediol (FARXIGA) 10 MG TABS tablet Take 1 tablet (10 mg total) by mouth daily before breakfast. 30 tablet 2  . ezetimibe (ZETIA) 10 MG tablet Take 1 tablet (10 mg total) by mouth daily. Take with largest meal of the day 90 tablet 3  . gabapentin (NEURONTIN) 800 MG tablet Take 800 mg by mouth 3 (three) times daily.    Marland Kitchen glimepiride (AMARYL) 4 MG tablet Take 8 mg by mouth daily with breakfast.     . insulin glargine (LANTUS) 100 UNIT/ML injection Inject 0.4 mLs (40 Units total) into the skin daily. (Patient taking differently: Inject 80 Units into the skin daily. ) 10 mL 11  . insulin lispro (HUMALOG) 100 UNIT/ML injection Inject 7-14 Units into the skin 3 (three) times daily before meals.     . lipase/protease/amylase (CREON) 36000 UNITS CPEP capsule Take 2 capsule by mouth with each meal and 1 capsule by mouth with each snack (Patient taking differently: Take 36,000-72,000 Units by mouth See admin instructions. Take 72000 units by mouth with each meal and 36000 units by mouth with each snack) 240 capsule 3  . losartan (COZAAR) 50 MG tablet Take 50 mg by mouth daily.    . metoprolol succinate (TOPROL-XL) 50 MG 24 hr tablet Take 1 tablet (50 mg total) by mouth daily. Take with or immediately following a meal. 90 tablet 3  . Multiple Vitamins-Minerals (ADULT ONE DAILY GUMMIES) CHEW Chew 1 capsule by mouth daily.    Marland Kitchen NARCAN 4 MG/0.1ML LIQD nasal spray kit Place 1 spray into the nose as needed (opioid overdose).     Marland Kitchen oxyCODONE (OXY IR/ROXICODONE) 5 MG immediate release tablet Take 10 mg by mouth  2 (two) times daily as needed for severe pain.     . potassium chloride (K-DUR) 10 MEQ tablet Take 10 mEq by mouth daily as needed (For cramps).     . rosuvastatin (CRESTOR) 40 MG tablet Take 1 tablet (40 mg total) by mouth daily. 90 tablet 3  . Toremifene Citrate (FARESTON) 60 MG tablet Take 1 tablet (60 mg total) by mouth daily. 30 tablet 0  . HYDROcodone-acetaminophen (NORCO/VICODIN) 5-325 MG tablet Take 1 tablet by mouth every 4 (four) hours as needed for moderate pain. (Patient not  taking: Reported on 01/16/2020) 20 tablet 0  . torsemide (DEMADEX) 20 MG tablet Take 2 tablets (40 mg total) by mouth daily. 60 tablet 0   No current facility-administered medications for this visit.    Review of Systems  Constitutional:  Constitutional negative. HENT: HENT negative.  Eyes: Eyes negative.  Respiratory: Respiratory negative.  Cardiovascular: Cardiovascular negative.  GI: Gastrointestinal negative.  Musculoskeletal: Positive for gait problem and leg pain.  Skin: Positive for wound.  Psychiatric: Psychiatric negative.        Objective:  Objective   Vitals:   01/16/20 1051  BP: (!) 158/95  Pulse: (!) 105  Resp: 20  Temp: 98 F (36.7 C)  SpO2: 94%  Weight: 207 lb (93.9 kg)  Height: 5' 6"  (1.676 m)   Body mass index is 33.41 kg/m.  Physical Exam HENT:     Nose:     Comments: Wearing a mask Eyes:     Pupils: Pupils are equal, round, and reactive to light.  Cardiovascular:     Pulses:          Popliteal pulses are 2+ on the left side.     Comments: Monophasic signal left anterior tibial artery Pulmonary:     Effort: Pulmonary effort is normal.  Abdominal:     General: Abdomen is flat.  Musculoskeletal:     Comments: Left foot wound on the lateral aspect of the dorsum of the foot with some underlying fatty necrosis no surrounding erythema  Skin:    Capillary Refill: Capillary refill takes 2 to 3 seconds.  Neurological:     General: No focal deficit present.     Mental  Status: She is alert.  Psychiatric:        Mood and Affect: Mood normal.        Behavior: Behavior normal.        Thought Content: Thought content normal.        Judgment: Judgment normal.     Data: I have independently interpreted her left lower extremity bypass duplex.  Flow throughout is monophasic.  Distal anastomosis is not visualized.  Graft is patent where visualized.  I have independent interpreted her ABIs to be 0.91 on the left and monophasic.  There are monophasic on the right with ABI 0.53 and toe pressure 51.  On the left for toe pressure of 0.     Assessment/Plan:     59 year old female status post left femoral to popliteal artery bypass with vein for sloughing of her left foot.  She does have an increased ABIs in the left but toe pressures of 0 at last angiography we did not intervene due to the fact that her wound was improving but now appears to be worsening again.  We will proceed with angiography she will likely need intervention of her anterior tibial artery and possibly also her posterior tibial artery to give her the best chance to heal.  I discussed with her the need for total smoking cessation.  She will continue Plavix.  We will get her scheduled today.     Waynetta Sandy MD [facility]

## 2020-01-16 NOTE — H&P (View-Only) (Signed)
Patient ID: Morgan Beasley, female   DOB: 11/26/1960, 59 y.o.   MRN: [id number]  Reason for Consult: Post-op Follow-up   Referred by Sonia Side., FNP  Subjective:     HPI:  Morgan Beasley is a 59 y.o. female underwent left femoral to popliteal artery bypass with vein for sloughing of her left foot.  She now has a persistent wound on the left foot.  She did undergo angiography but at the time her wound was improving no intervention was undertaken.  She appeared to have intertibial posterior tibial disease at the time.  She has persistent pain in the left foot.  She does walk without any assistance but is very slow due to the pain in the left foot.  She also has pain in the right lower extremity has never had intervention does not have any wounds at this time.  Past Medical History:  Diagnosis Date  . Alcohol dependence ([place])   . Anemia   . Anxiety   . Breast cancer ([place])   . Chronic combined systolic and diastolic CHF (congestive heart failure) ([place])   . Cigarette nicotine dependence   . CKD (chronic kidney disease), stage III   . Colon polyps   . COPD (chronic obstructive pulmonary disease) ([place])   . Diabetes mellitus without complication ([place])   . Diabetic neuropathy ([place])   . Elevated lipase   . Gout   . Hyperlipemia   . Hypertension   . Insomnia   . Lymphedema   . Marijuana use   . Mild CAD 2016   a. NSTEMI 2016 in context of cocaine abuse, 50% RCA% at that time.  . OSA treated with BiPAP   . PAD (peripheral artery disease) ([place])    a. s/p L SFA stenting 08/2019. b. left fem-to-below-knee-popliteal bypass 09/2019.  Marland Kitchen Sleep apnea    wears BIPAP  . Ulcer of foot ([place])    right   Family History  Problem Relation Age of Onset  . Diabetes Other   . Heart disease Other   . Breast cancer Maternal Grandmother   . Breast cancer Paternal Grandmother   . Stroke Son   . Colon cancer Neg Hx   . Esophageal cancer Neg Hx   . Rectal cancer Neg Hx   . Stomach  cancer Neg Hx    Past Surgical History:  Procedure Laterality Date  . ABDOMINAL AORTOGRAM W/LOWER EXTREMITY N/A 09/26/2019   Procedure: ABDOMINAL AORTOGRAM W/LOWER EXTREMITY;  Surgeon: Angelia Mould, MD;  Location: [facility];  Service: Cardiovascular;  Laterality: N/A;  . ABDOMINAL AORTOGRAM W/LOWER EXTREMITY Bilateral 12/08/2019   Procedure: ABDOMINAL AORTOGRAM W/LOWER EXTREMITY;  Surgeon: Waynetta Sandy, MD;  Location: [facility];  Service: Cardiovascular;  Laterality: Bilateral;  . ABDOMINAL HYSTERECTOMY    . [facility]  . FEMORAL-POPLITEAL BYPASS GRAFT Left 10/14/2019   Procedure: BYPASS GRAFT LEFT FEMORAL-POPLITEAL ARTERY USING NONREVERSED SAPHENOUS VEIN;  Surgeon: Waynetta Sandy, MD;  Location: [facility];  Service: Vascular;  Laterality: Left;  Marland Kitchen MASTECTOMY Right April 2016  . PERIPHERAL VASCULAR INTERVENTION Left 09/26/2019   Procedure: PERIPHERAL VASCULAR INTERVENTION;  Surgeon: Angelia Mould, MD;  Location: [facility];  Service: Cardiovascular;  Laterality: Left;  superficial femoral    Short Social History:  Social History   Tobacco Use  . Smoking status: Light Tobacco Smoker    Types: Cigarettes    Last attempt to quit: 06/04/2019  Years since quitting: 0.6  . Smokeless tobacco: Never Used  Substance Use Topics  . Alcohol use: Not Currently    Comment: Beer - "on the weekends hanging out, maybe 2 or 3"     Allergies  Allergen Reactions  . Nsaids Other (See Comments)    Pancreatitis  . Tolmetin Other (See Comments)    Pancreatitis  . Aspirin Other (See Comments)    "Makes my pancreas act up"   . Tramadol Swelling    Current Outpatient Medications  Medication Sig Dispense Refill  . albuterol (PROAIR HFA) 108 (90 Base) MCG/ACT inhaler Inhale 2 puffs into the lungs every 6 (six) hours as needed for shortness of breath.     . allopurinol (ZYLOPRIM) 300 MG tablet Take 300 mg by mouth  daily.    Marland Kitchen amitriptyline (ELAVIL) 10 MG tablet Take 5 tablets (50 mg total) by mouth at bedtime. 150 tablet 3  . amitriptyline (ELAVIL) 100 MG tablet Take 200 mg by mouth at bedtime.    . budesonide-formoterol (SYMBICORT) 160-4.5 MCG/ACT inhaler Inhale 2 puffs into the lungs 2 (two) times daily.    . clopidogrel (PLAVIX) 75 MG tablet Take 1 tablet (75 mg total) by mouth daily. 30 tablet 11  . dapagliflozin propanediol (FARXIGA) 10 MG TABS tablet Take 1 tablet (10 mg total) by mouth daily before breakfast. 30 tablet 2  . ezetimibe (ZETIA) 10 MG tablet Take 1 tablet (10 mg total) by mouth daily. Take with largest meal of the day 90 tablet 3  . gabapentin (NEURONTIN) 800 MG tablet Take 800 mg by mouth 3 (three) times daily.    Marland Kitchen glimepiride (AMARYL) 4 MG tablet Take 8 mg by mouth daily with breakfast.     . insulin glargine (LANTUS) 100 UNIT/ML injection Inject 0.4 mLs (40 Units total) into the skin daily. (Patient taking differently: Inject 80 Units into the skin daily. ) 10 mL 11  . insulin lispro (HUMALOG) 100 UNIT/ML injection Inject 7-14 Units into the skin 3 (three) times daily before meals.     . lipase/protease/amylase (CREON) 36000 UNITS CPEP capsule Take 2 capsule by mouth with each meal and 1 capsule by mouth with each snack (Patient taking differently: Take 36,000-72,000 Units by mouth See admin instructions. Take 72000 units by mouth with each meal and 36000 units by mouth with each snack) 240 capsule 3  . losartan (COZAAR) 50 MG tablet Take 50 mg by mouth daily.    . metoprolol succinate (TOPROL-XL) 50 MG 24 hr tablet Take 1 tablet (50 mg total) by mouth daily. Take with or immediately following a meal. 90 tablet 3  . Multiple Vitamins-Minerals (ADULT ONE DAILY GUMMIES) CHEW Chew 1 capsule by mouth daily.    Marland Kitchen NARCAN 4 MG/0.1ML LIQD nasal spray kit Place 1 spray into the nose as needed (opioid overdose).     Marland Kitchen oxyCODONE (OXY IR/ROXICODONE) 5 MG immediate release tablet Take 10 mg by mouth  2 (two) times daily as needed for severe pain.     . potassium chloride (K-DUR) 10 MEQ tablet Take 10 mEq by mouth daily as needed (For cramps).     . rosuvastatin (CRESTOR) 40 MG tablet Take 1 tablet (40 mg total) by mouth daily. 90 tablet 3  . Toremifene Citrate (FARESTON) 60 MG tablet Take 1 tablet (60 mg total) by mouth daily. 30 tablet 0  . HYDROcodone-acetaminophen (NORCO/VICODIN) 5-325 MG tablet Take 1 tablet by mouth every 4 (four) hours as needed for moderate pain. (Patient not  taking: Reported on 01/16/2020) 20 tablet 0  . torsemide (DEMADEX) 20 MG tablet Take 2 tablets (40 mg total) by mouth daily. 60 tablet 0   No current facility-administered medications for this visit.    Review of Systems  Constitutional:  Constitutional negative. HENT: HENT negative.  Eyes: Eyes negative.  Respiratory: Respiratory negative.  Cardiovascular: Cardiovascular negative.  GI: Gastrointestinal negative.  Musculoskeletal: Positive for gait problem and leg pain.  Skin: Positive for wound.  Psychiatric: Psychiatric negative.        Objective:  Objective   Vitals:   01/16/20 1051  BP: (!) 158/95  Pulse: (!) 105  Resp: 20  Temp: 98 F (36.7 C)  SpO2: 94%  Weight: 207 lb (93.9 kg)  Height: 5' 6"  (1.676 m)   Body mass index is 33.41 kg/m.  Physical Exam HENT:     Nose:     Comments: Wearing a mask Eyes:     Pupils: Pupils are equal, round, and reactive to light.  Cardiovascular:     Pulses:          Popliteal pulses are 2+ on the left side.     Comments: Monophasic signal left anterior tibial artery Pulmonary:     Effort: Pulmonary effort is normal.  Abdominal:     General: Abdomen is flat.  Musculoskeletal:     Comments: Left foot wound on the lateral aspect of the dorsum of the foot with some underlying fatty necrosis no surrounding erythema  Skin:    Capillary Refill: Capillary refill takes 2 to 3 seconds.  Neurological:     General: No focal deficit present.     Mental  Status: She is alert.  Psychiatric:        Mood and Affect: Mood normal.        Behavior: Behavior normal.        Thought Content: Thought content normal.        Judgment: Judgment normal.     Data: I have independently interpreted her left lower extremity bypass duplex.  Flow throughout is monophasic.  Distal anastomosis is not visualized.  Graft is patent where visualized.  I have independent interpreted her ABIs to be 0.91 on the left and monophasic.  There are monophasic on the right with ABI 0.53 and toe pressure 51.  On the left for toe pressure of 0.     Assessment/Plan:     60 year old female status post left femoral to popliteal artery bypass with vein for sloughing of her left foot.  She does have an increased ABIs in the left but toe pressures of 0 at last angiography we did not intervene due to the fact that her wound was improving but now appears to be worsening again.  We will proceed with angiography she will likely need intervention of her anterior tibial artery and possibly also her posterior tibial artery to give her the best chance to heal.  I discussed with her the need for total smoking cessation.  She will continue Plavix.  We will get her scheduled today.     Waynetta Sandy MD [facility]

## 2020-01-22 ENCOUNTER — Other Ambulatory Visit (HOSPITAL_COMMUNITY)
Admission: RE | Admit: 2020-01-22 | Discharge: 2020-01-22 | Disposition: A | Discharge status: Home / Self Care | Payer: Medicare Other | Source: Ambulatory Visit | Attending: Vascular Surgery | Admitting: Vascular Surgery

## 2020-01-22 DIAGNOSIS — Z01812 Encounter for preprocedural laboratory examination: Secondary | ICD-10-CM | POA: Diagnosis present

## 2020-01-22 DIAGNOSIS — Z20822 Contact with and (suspected) exposure to covid-19: Secondary | ICD-10-CM | POA: Insufficient documentation

## 2020-01-22 LAB — SARS CORONAVIRUS 2 (TAT 6-24 HRS): SARS Coronavirus 2: NEGATIVE

## 2020-01-26 ENCOUNTER — Ambulatory Visit (HOSPITAL_COMMUNITY)
Admission: RE | Admit: 2020-01-26 | Discharge: 2020-01-26 | Disposition: A | Discharge status: Home / Self Care | Payer: Medicare Other | Attending: Vascular Surgery | Admitting: Vascular Surgery

## 2020-01-26 ENCOUNTER — Encounter (HOSPITAL_COMMUNITY)
Admission: RE | Disposition: A | Discharge status: Home / Self Care | Payer: Self-pay | Source: Home / Self Care | Attending: Vascular Surgery

## 2020-01-26 ENCOUNTER — Other Ambulatory Visit: Payer: Self-pay

## 2020-01-26 ENCOUNTER — Encounter (HOSPITAL_BASED_OUTPATIENT_CLINIC_OR_DEPARTMENT_OTHER): Payer: Medicare Other | Admitting: Internal Medicine

## 2020-01-26 DIAGNOSIS — Z794 Long term (current) use of insulin: Secondary | ICD-10-CM | POA: Insufficient documentation

## 2020-01-26 DIAGNOSIS — E11621 Type 2 diabetes mellitus with foot ulcer: Secondary | ICD-10-CM | POA: Diagnosis not present

## 2020-01-26 DIAGNOSIS — M109 Gout, unspecified: Secondary | ICD-10-CM | POA: Insufficient documentation

## 2020-01-26 DIAGNOSIS — N183 Chronic kidney disease, stage 3 unspecified: Secondary | ICD-10-CM | POA: Diagnosis not present

## 2020-01-26 DIAGNOSIS — G4730 Sleep apnea, unspecified: Secondary | ICD-10-CM | POA: Insufficient documentation

## 2020-01-26 DIAGNOSIS — J449 Chronic obstructive pulmonary disease, unspecified: Secondary | ICD-10-CM | POA: Diagnosis not present

## 2020-01-26 DIAGNOSIS — E1140 Type 2 diabetes mellitus with diabetic neuropathy, unspecified: Secondary | ICD-10-CM | POA: Insufficient documentation

## 2020-01-26 DIAGNOSIS — G4733 Obstructive sleep apnea (adult) (pediatric): Secondary | ICD-10-CM | POA: Insufficient documentation

## 2020-01-26 DIAGNOSIS — Z87891 Personal history of nicotine dependence: Secondary | ICD-10-CM | POA: Insufficient documentation

## 2020-01-26 DIAGNOSIS — I890 Lymphedema, not elsewhere classified: Secondary | ICD-10-CM | POA: Diagnosis not present

## 2020-01-26 DIAGNOSIS — E1122 Type 2 diabetes mellitus with diabetic chronic kidney disease: Secondary | ICD-10-CM | POA: Diagnosis not present

## 2020-01-26 DIAGNOSIS — E785 Hyperlipidemia, unspecified: Secondary | ICD-10-CM | POA: Insufficient documentation

## 2020-01-26 DIAGNOSIS — Z7902 Long term (current) use of antithrombotics/antiplatelets: Secondary | ICD-10-CM | POA: Insufficient documentation

## 2020-01-26 DIAGNOSIS — I70245 Atherosclerosis of native arteries of left leg with ulceration of other part of foot: Secondary | ICD-10-CM | POA: Insufficient documentation

## 2020-01-26 DIAGNOSIS — I132 Hypertensive heart and chronic kidney disease with heart failure and with stage 5 chronic kidney disease, or end stage renal disease: Secondary | ICD-10-CM | POA: Insufficient documentation

## 2020-01-26 DIAGNOSIS — Z853 Personal history of malignant neoplasm of breast: Secondary | ICD-10-CM | POA: Insufficient documentation

## 2020-01-26 DIAGNOSIS — I5042 Chronic combined systolic (congestive) and diastolic (congestive) heart failure: Secondary | ICD-10-CM | POA: Insufficient documentation

## 2020-01-26 DIAGNOSIS — I2510 Atherosclerotic heart disease of native coronary artery without angina pectoris: Secondary | ICD-10-CM | POA: Diagnosis not present

## 2020-01-26 DIAGNOSIS — I252 Old myocardial infarction: Secondary | ICD-10-CM | POA: Insufficient documentation

## 2020-01-26 DIAGNOSIS — I998 Other disorder of circulatory system: Secondary | ICD-10-CM | POA: Diagnosis not present

## 2020-01-26 DIAGNOSIS — E1151 Type 2 diabetes mellitus with diabetic peripheral angiopathy without gangrene: Secondary | ICD-10-CM | POA: Insufficient documentation

## 2020-01-26 DIAGNOSIS — Z79899 Other long term (current) drug therapy: Secondary | ICD-10-CM | POA: Diagnosis not present

## 2020-01-26 DIAGNOSIS — L97529 Non-pressure chronic ulcer of other part of left foot with unspecified severity: Secondary | ICD-10-CM | POA: Diagnosis not present

## 2020-01-26 HISTORY — PX: PERIPHERAL VASCULAR BALLOON ANGIOPLASTY: CATH118281

## 2020-01-26 HISTORY — PX: PERIPHERAL VASCULAR ATHERECTOMY: CATH118256

## 2020-01-26 HISTORY — PX: ABDOMINAL AORTOGRAM W/LOWER EXTREMITY: CATH118223

## 2020-01-26 LAB — POCT I-STAT, CHEM 8
BUN: 13 mg/dL (ref 6–20)
Calcium, Ion: 1.01 mmol/L — ABNORMAL LOW (ref 1.15–1.40)
Chloride: 100 mmol/L (ref 98–111)
Creatinine, Ser: 0.9 mg/dL (ref 0.44–1.00)
Glucose, Bld: 423 mg/dL — ABNORMAL HIGH (ref 70–99)
HCT: 41 % (ref 36.0–46.0)
Hemoglobin: 13.9 g/dL (ref 12.0–15.0)
Potassium: 4.1 mmol/L (ref 3.5–5.1)
Sodium: 136 mmol/L (ref 135–145)
TCO2: 26 mmol/L (ref 22–32)

## 2020-01-26 LAB — GLUCOSE, CAPILLARY
Glucose-Capillary: 375 mg/dL — ABNORMAL HIGH (ref 70–99)
Glucose-Capillary: 301 mg/dL — ABNORMAL HIGH (ref 70–99)
Glucose-Capillary: 339 mg/dL — ABNORMAL HIGH (ref 70–99)

## 2020-01-26 SURGERY — ABDOMINAL AORTOGRAM W/LOWER EXTREMITY
Anesthesia: LOCAL | Laterality: Left

## 2020-01-26 MED ORDER — LIDOCAINE HCL (PF) 1 % IJ SOLN
INTRAMUSCULAR | Status: DC | PRN
  Administered 2020-01-26: 18 mL

## 2020-01-26 MED ORDER — LABETALOL HCL 5 MG/ML IV SOLN: 10.0000 mg | INTRAVENOUS | Status: DC | PRN

## 2020-01-26 MED ORDER — MIDAZOLAM HCL 2 MG/2ML IJ SOLN
INTRAMUSCULAR | Status: AC
  Filled 2020-01-26: qty 2

## 2020-01-26 MED ORDER — SODIUM CHLORIDE 0.9 % WEIGHT BASED INFUSION: 1.0000 mL/kg/h | INTRAVENOUS | Status: DC

## 2020-01-26 MED ORDER — HEPARIN SODIUM (PORCINE) 1000 UNIT/ML IJ SOLN
INTRAMUSCULAR | Status: DC | PRN
  Administered 2020-01-26: 9000 [IU] via INTRAVENOUS

## 2020-01-26 MED ORDER — FENTANYL CITRATE (PF) 100 MCG/2ML IJ SOLN
INTRAMUSCULAR | Status: DC | PRN
Start: 2020-01-26 — End: 2020-01-26
  Administered 2020-01-26 (×2): 50 ug via INTRAVENOUS

## 2020-01-26 MED ORDER — CLOPIDOGREL BISULFATE 75 MG PO TABS: 75.0000 mg | ORAL_TABLET | Freq: Every day | ORAL | Status: DC

## 2020-01-26 MED ORDER — FENTANYL CITRATE (PF) 100 MCG/2ML IJ SOLN
INTRAMUSCULAR | Status: AC
  Filled 2020-01-26: qty 2

## 2020-01-26 MED ORDER — MIDAZOLAM HCL 2 MG/2ML IJ SOLN
INTRAMUSCULAR | Status: DC | PRN
  Administered 2020-01-26 (×2): 1 mg via INTRAVENOUS

## 2020-01-26 MED ORDER — INSULIN ASPART 100 UNIT/ML ~~LOC~~ SOLN: 6.0000 [IU] | Freq: Once | SUBCUTANEOUS | Status: DC

## 2020-01-26 MED ORDER — MORPHINE SULFATE (PF) 2 MG/ML IV SOLN: 2.0000 mg | INTRAVENOUS | Status: DC | PRN

## 2020-01-26 MED ORDER — CLOPIDOGREL BISULFATE 75 MG PO TABS
ORAL_TABLET | ORAL | Status: DC | PRN
  Administered 2020-01-26: 75 mg via ORAL

## 2020-01-26 MED ORDER — INSULIN ASPART 100 UNIT/ML ~~LOC~~ SOLN
SUBCUTANEOUS | Status: AC
  Administered 2020-01-26: 6 [IU]
  Filled 2020-01-26: qty 1

## 2020-01-26 MED ORDER — HEPARIN SODIUM (PORCINE) 1000 UNIT/ML IJ SOLN
INTRAMUSCULAR | Status: AC
  Filled 2020-01-26: qty 1

## 2020-01-26 MED ORDER — SODIUM CHLORIDE 0.9% FLUSH: 3.0000 mL | Freq: Two times a day (BID) | INTRAVENOUS | Status: DC

## 2020-01-26 MED ORDER — ACETAMINOPHEN 325 MG PO TABS: 650.0000 mg | ORAL_TABLET | ORAL | Status: DC | PRN

## 2020-01-26 MED ORDER — HYDRALAZINE HCL 20 MG/ML IJ SOLN: 5.0000 mg | INTRAMUSCULAR | Status: DC | PRN

## 2020-01-26 MED ORDER — HEPARIN (PORCINE) IN NACL 1000-0.9 UT/500ML-% IV SOLN
INTRAVENOUS | Status: AC
  Filled 2020-01-26: qty 1000

## 2020-01-26 MED ORDER — HEPARIN (PORCINE) IN NACL 1000-0.9 UT/500ML-% IV SOLN
INTRAVENOUS | Status: DC | PRN
  Administered 2020-01-26: 500 mL

## 2020-01-26 MED ORDER — SODIUM CHLORIDE 0.9 % IV SOLN: INTRAVENOUS | Status: DC

## 2020-01-26 MED ORDER — ONDANSETRON HCL 4 MG/2ML IJ SOLN: 4.0000 mg | Freq: Four times a day (QID) | INTRAMUSCULAR | Status: DC | PRN

## 2020-01-26 MED ORDER — CLOPIDOGREL BISULFATE 75 MG PO TABS
ORAL_TABLET | ORAL | Status: AC
  Filled 2020-01-26: qty 1

## 2020-01-26 MED ORDER — IODIXANOL 320 MG/ML IV SOLN
INTRAVENOUS | Status: DC | PRN
  Administered 2020-01-26: 130 mL

## 2020-01-26 MED ORDER — SODIUM CHLORIDE 0.9% FLUSH: 3.0000 mL | INTRAVENOUS | Status: DC | PRN

## 2020-01-26 MED ORDER — LIDOCAINE HCL (PF) 1 % IJ SOLN
INTRAMUSCULAR | Status: AC
  Filled 2020-01-26: qty 30

## 2020-01-26 MED ORDER — INSULIN ASPART 100 UNIT/ML ~~LOC~~ SOLN
8.0000 [IU] | Freq: Once | SUBCUTANEOUS | Status: AC
  Administered 2020-01-26: 8 [IU] via SUBCUTANEOUS

## 2020-01-26 MED ORDER — SODIUM CHLORIDE 0.9 % IV SOLN: 250.0000 mL | INTRAVENOUS | Status: DC | PRN

## 2020-01-26 MED ORDER — OXYCODONE HCL 5 MG PO TABS
5.0000 mg | ORAL_TABLET | ORAL | Status: DC | PRN
  Administered 2020-01-26: 10 mg via ORAL
  Filled 2020-01-26: qty 2

## 2020-01-26 SURGICAL SUPPLY — 21 items
BALLN STERLING OTW 3X150X150 (BALLOONS) ×2
BALLOON STERLING OTW 3X150X150 (BALLOONS) ×1 IMPLANT
CATH AURYON 4FR ATHEREC 0.9 (CATHETERS) ×2 IMPLANT
CATH OMNI FLUSH 5F 65CM (CATHETERS) ×2 IMPLANT
CATH QUICKCROSS .018X135CM (MICROCATHETER) ×2 IMPLANT
CATH TEMPO AQUA 5F 100CM (CATHETERS) ×2 IMPLANT
CLOSURE MYNX CONTROL 6F/7F (Vascular Products) ×2 IMPLANT
KIT ENCORE 26 ADVANTAGE (KITS) ×2 IMPLANT
KIT MICROPUNCTURE NIT STIFF (SHEATH) ×2 IMPLANT
KIT PV (KITS) ×2 IMPLANT
SHEATH PINNACLE 5F 10CM (SHEATH) ×2 IMPLANT
SHEATH PINNACLE 6F 10CM (SHEATH) ×2 IMPLANT
SHEATH PINNACLE ST 6F 65CM (SHEATH) ×2 IMPLANT
SHEATH PROBE COVER 6X72 (BAG) ×2 IMPLANT
SYR MEDRAD MARK V 150ML (SYRINGE) ×2 IMPLANT
TRANSDUCER W/STOPCOCK (MISCELLANEOUS) ×2 IMPLANT
TRAY PV CATH (CUSTOM PROCEDURE TRAY) ×2 IMPLANT
WIRE G V18X300CM (WIRE) ×2 IMPLANT
WIRE ROSEN-J .035X260CM (WIRE) ×2 IMPLANT
WIRE SPARTACORE .014X300CM (WIRE) ×2 IMPLANT
WIRE STARTER BENTSON 035X150 (WIRE) ×2 IMPLANT

## 2020-01-26 NOTE — Interval H&P Note (Signed)
History and Physical Interval Note:  01/26/2020 12:47 PM  Morgan Beasley  has presented today for surgery, with the diagnosis of PVD.  The various methods of treatment have been discussed with the patient and family. After consideration of risks, benefits and other options for treatment, the patient has consented to  Procedure(s): ABDOMINAL AORTOGRAM W/LOWER EXTREMITY (Left) as a surgical intervention.  The patient's history has been reviewed, patient examined, no change in status, stable for surgery.  I have reviewed the patient's chart and labs.  Questions were answered to the patient's satisfaction.     Servando Snare

## 2020-01-26 NOTE — Progress Notes (Signed)
Dr Donzetta Matters notified of CBG and no new orders

## 2020-01-26 NOTE — Progress Notes (Signed)
Dr Donzetta Matters requested Darco shoe. Ortho tech called for [employer]

## 2020-01-26 NOTE — Discharge Instructions (Signed)
Femoral Site Care This sheet gives you information about how to care for yourself after your procedure. Your health care provider may also give you more specific instructions. If you have problems or questions, contact your health care provider. What can I expect after the procedure? After the procedure, it is common to have:  Bruising that usually fades within 1-2 weeks.  Tenderness at the site. Follow these instructions at home: Wound care  Follow instructions from your health care provider about how to take care of your insertion site. Make sure you: ? Wash your hands with soap and water before you change your bandage (dressing). If soap and water are not available, use hand sanitizer. ? Change your dressing as told by your health care provider. ? Leave stitches (sutures), skin glue, or adhesive strips in place. These skin closures may need to stay in place for 2 weeks or longer. If adhesive strip edges start to loosen and curl up, you may trim the loose edges. Do not remove adhesive strips completely unless your health care provider tells you to do that.  Do not take baths, swim, or use a hot tub until your health care provider approves.  You may shower 24-48 hours after the procedure or as told by your health care provider. ? Gently wash the site with plain soap and water. ? Pat the area dry with a clean towel. ? Do not rub the site. This may cause bleeding.  Do not apply powder or lotion to the site. Keep the site clean and dry.  Check your femoral site every day for signs of infection. Check for: ? Redness, swelling, or pain. ? Fluid or blood. ? Warmth. ? Pus or a bad smell. Activity  For the first 2-3 days after your procedure, or as long as directed: ? Avoid climbing stairs as much as possible. ? Do not squat.  Do not lift anything that is heavier than 10 lb (4.5 kg), or the limit that you are told, until your health care provider says that it is safe.  Rest as  directed. ? Avoid sitting for a long time without moving. Get up to take short walks every 1-2 hours.  Do not drive for 24 hours if you were given a medicine to help you relax (sedative). General instructions  Take over-the-counter and prescription medicines only as told by your health care provider.  Keep all follow-up visits as told by your health care provider. This is important. Contact a health care provider if you have:  A fever or chills.  You have redness, swelling, or pain around your insertion site. Get help right away if:  The catheter insertion area swells very fast.  You pass out.  You suddenly start to sweat or your skin gets clammy.  The catheter insertion area is bleeding, and the bleeding does not stop when you hold steady pressure on the area.  The area near or just beyond the catheter insertion site becomes pale, cool, tingly, or numb. These symptoms may represent a serious problem that is an emergency. Do not wait to see if the symptoms will go away. Get medical help right away. Call your local emergency services (911 in the [place] Do not drive yourself to the hospital. Summary  After the procedure, it is common to have bruising that usually fades within 1-2 weeks.  Check your femoral site every day for signs of infection.  Do not lift anything that is heavier than 10 lb (4.5 kg), or the   limit that you are told, until your health care provider says that it is safe. This information is not intended to replace advice given to you by your health care provider. Make sure you discuss any questions you have with your health care provider. Document Revised: 04/30/2017 Document Reviewed: 04/30/2017 Elsevier Patient Education  2020 Elsevier Inc.  

## 2020-01-26 NOTE — Progress Notes (Signed)
Up and walked and tolerated well; right groin stable, no bleeding or hematoma 

## 2020-01-26 NOTE — Op Note (Signed)
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 Donzetta Matters, MD Procedure Performed: 1.  Ultrasound-guided cannulation right common femoral artery 2.  Left lower extremity angiography 3.  Laser atherectomy of left tibioperoneal trunk, posterior tibial artery, anterior tibial artery with 0.9 [place] 4.  Plain balloon angioplasty with 3 mm balloon of left tibioperoneal trunk, posterior tibial artery and anterior tibial arteries 5.  Mynx device closure right common femoral artery 6.  Moderate sedation with fentanyl and Versed for 62 minutes   Indications: 59 year old female with history of a left femoral to popliteal artery bypass with vein.  This was done for critical limb ischemia with wound.  Initially the wound was improving but she did have indication for angiography.  She had obvious tibial outflow disease with spider vein.  The wound is now worsening she has significant pain she is indicated for angiography with possible intervention.  Findings: The left lower extremity bypass is patent to the level of the below-knee popliteal artery.  Peroneal artery appears occluded.  Posterior tibial artery has approximately 10 cm long segment high-grade stenosis greater than 70%.  After intervention this is resolved.  There is possibly a small dissection that is nonflow limiting was not intervened upon.  The anterior tibial artery is occluded for approximately 2 cm's.  After intervention there is no further residual stenosis or dissection.  She has very strong signal in the anterior tibial artery that can be traced out to the distal dorsum of the foot.   Procedure:  The patient was identified in the holding area and taken to room 8.  The patient was then placed supine on the table and prepped and draped in the usual sterile fashion.  A time out  was called.  Ultrasound was used to evaluate the right common femoral artery which was noted to be patent.  The areas anesthetized 1% lidocaine cannulated with micropuncture needle followed by wire and sheath.  And images saved the permanent record.  Bentson wires placed followed by 5 [place] sheath.  Omni catheter was used across the bifurcation perform left lower extremity angiography.  With the above findings we placed a Rosen wire into the left lower extremity bypass placed a 6 French sheath up and over the bifurcation into the bypass patient was fully heparinized.  We used a V 18 wire to cross on the posterior tibial artery and confirmed intraluminal access.  We then used a 0.9 laser for arthrectomy and primarily ballooned dilated with 3 mm balloon at nominal pressure for the entirety of the lesion of the posterior tibial tibioperoneal trunk.  Completion demonstrated no residual stenosis there was possibly 1 area of dissection was nonflow limiting we did not reintervene.  We then directed a V 18 to the anterior tibial artery across the lesion confirmed intraluminal access.  Laser arthrectomy was performed the anterior tibial artery followed by plain balloon angioplasty with 3 mm balloon.  Completion again demonstrated no residual stenosis or dissection.  There was good runoff via the anterior tibial artery onto the foot.  The posterior tibial artery does give out at the ankle.  There was a very strong stream with the intertibial artery that could be traced onto the foot.  Satisfied we exchanged over a wire for a short 6 [place] sheath and deployed a minx device.  She tolerated procedure without any complication.  Contrast: 130  cc  Cavan Bearden C. Donzetta Matters, MD [facility] Office: [phone] Pager: [phone]

## 2020-01-26 NOTE — Progress Notes (Signed)
Dr Donzetta Matters notified of CBG and orders noted; may d/c if CBG <300

## 2020-01-26 NOTE — Progress Notes (Signed)
Dr Donzetta Matters called and informed of CBG new orders noted.

## 2020-01-26 NOTE — Progress Notes (Signed)
Orthopedic Tech Progress Note Patient Details:  Morgan Beasley [facility] [birth year] [phone] SHORT STAY RN called requesting a [place]. I dropped it off at [facility] where patient. I never applied it Ortho Devices Type of Ortho Device: Darco shoe Ortho Device/Splint Interventions: Other (comment)   Post Interventions Patient Tolerated: Other (comment) Instructions Provided: Other (comment)   Janit Pagan 01/26/2020, 1:45 PM

## 2020-01-27 ENCOUNTER — Encounter (HOSPITAL_COMMUNITY): Payer: Self-pay | Admitting: Vascular Surgery

## 2020-02-09 ENCOUNTER — Other Ambulatory Visit: Payer: Medicare Other

## 2020-02-09 ENCOUNTER — Other Ambulatory Visit: Payer: Self-pay

## 2020-02-09 DIAGNOSIS — I5042 Chronic combined systolic (congestive) and diastolic (congestive) heart failure: Secondary | ICD-10-CM

## 2020-02-09 DIAGNOSIS — E782 Mixed hyperlipidemia: Secondary | ICD-10-CM

## 2020-02-10 LAB — BASIC METABOLIC PANEL
BUN/Creatinine Ratio: 13 (ref 9–23)
BUN: 14 mg/dL (ref 6–24)
CO2: 25 mmol/L (ref 20–29)
Calcium: 8.6 mg/dL — ABNORMAL LOW (ref 8.7–10.2)
Chloride: 100 mmol/L (ref 96–106)
Creatinine, Ser: 1.09 mg/dL — ABNORMAL HIGH (ref 0.57–1.00)
GFR calc Af Amer: 65 mL/min/{1.73_m2} (ref >59)
GFR calc non Af Amer: 56 mL/min/{1.73_m2} — ABNORMAL LOW (ref >59)
Glucose: 358 mg/dL — ABNORMAL HIGH (ref 65–99)
Potassium: 4.2 mmol/L (ref 3.5–5.2)
Sodium: 137 mmol/L (ref 134–144)

## 2020-02-10 LAB — LIPID PANEL
Chol/HDL Ratio: 4.1 ratio (ref 0.0–4.4)
Cholesterol, Total: 219 mg/dL — ABNORMAL HIGH (ref 100–199)
HDL: 53 mg/dL (ref >39)
LDL Chol Calc (NIH): 135 mg/dL — ABNORMAL HIGH (ref 0–99)
Triglycerides: 175 mg/dL — ABNORMAL HIGH (ref 0–149)
VLDL Cholesterol Cal: 31 mg/dL (ref 5–40)

## 2020-02-10 LAB — PRO B NATRIURETIC PEPTIDE: NT-Pro BNP: 1185 pg/mL — ABNORMAL HIGH (ref 0–287)

## 2020-02-10 LAB — AST: AST: 12 IU/L (ref 0–40)

## 2020-02-13 ENCOUNTER — Telehealth: Payer: Self-pay | Admitting: *Deleted

## 2020-02-13 DIAGNOSIS — E782 Mixed hyperlipidemia: Secondary | ICD-10-CM

## 2020-02-13 NOTE — Telephone Encounter (Signed)
Lipid order placed

## 2020-02-13 NOTE — Telephone Encounter (Signed)
I spoke w patient.  She verbalizes she is not taking Crestor or Zetia.  Atorvastatin is on her list but not taking this either.  She will begin Crestor and Zetia.  Reports she has these medications at home.  She will bring all meds she is taking w her to next appointment. 03/11/20.

## 2020-02-13 NOTE — Telephone Encounter (Signed)
-----   Message from Fay Records, MD sent at 02/10/2020  9:32 AM EDT ----- Glucose is very high   Watch carbs Kidney function is OK Lipids need to be better   She is not on Zetia   It is worth a try but I am not convinced it will get lipids to goal   Add   10 mg   F/U lipids in 8 wks

## 2020-02-18 ENCOUNTER — Encounter (HOSPITAL_BASED_OUTPATIENT_CLINIC_OR_DEPARTMENT_OTHER): Payer: Medicare Other | Admitting: Physician Assistant

## 2020-02-23 ENCOUNTER — Other Ambulatory Visit: Payer: Self-pay

## 2020-02-23 ENCOUNTER — Encounter (HOSPITAL_COMMUNITY): Payer: Self-pay

## 2020-02-23 ENCOUNTER — Emergency Department (HOSPITAL_COMMUNITY): Payer: Medicare Other

## 2020-02-23 ENCOUNTER — Inpatient Hospital Stay (HOSPITAL_COMMUNITY)
Admission: EM | Admit: 2020-02-23 | Discharge: 2020-03-18 | DRG: 871 | Disposition: A | Discharge status: Home Health | Payer: Medicare Other | Attending: Internal Medicine | Admitting: Internal Medicine

## 2020-02-23 ENCOUNTER — Encounter (HOSPITAL_BASED_OUTPATIENT_CLINIC_OR_DEPARTMENT_OTHER): Payer: Medicare Other | Admitting: Internal Medicine

## 2020-02-23 DIAGNOSIS — I5023 Acute on chronic systolic (congestive) heart failure: Secondary | ICD-10-CM | POA: Diagnosis not present

## 2020-02-23 DIAGNOSIS — Z823 Family history of stroke: Secondary | ICD-10-CM

## 2020-02-23 DIAGNOSIS — R52 Pain, unspecified: Secondary | ICD-10-CM | POA: Diagnosis not present

## 2020-02-23 DIAGNOSIS — Z803 Family history of malignant neoplasm of breast: Secondary | ICD-10-CM

## 2020-02-23 DIAGNOSIS — I5043 Acute on chronic combined systolic (congestive) and diastolic (congestive) heart failure: Secondary | ICD-10-CM | POA: Diagnosis present

## 2020-02-23 DIAGNOSIS — F1020 Alcohol dependence, uncomplicated: Secondary | ICD-10-CM | POA: Diagnosis present

## 2020-02-23 DIAGNOSIS — R590 Localized enlarged lymph nodes: Secondary | ICD-10-CM | POA: Diagnosis present

## 2020-02-23 DIAGNOSIS — E1142 Type 2 diabetes mellitus with diabetic polyneuropathy: Secondary | ICD-10-CM | POA: Diagnosis present

## 2020-02-23 DIAGNOSIS — Z95820 Peripheral vascular angioplasty status with implants and grafts: Secondary | ICD-10-CM

## 2020-02-23 DIAGNOSIS — Z6832 Body mass index (BMI) 32.0-32.9, adult: Secondary | ICD-10-CM

## 2020-02-23 DIAGNOSIS — J9811 Atelectasis: Secondary | ICD-10-CM | POA: Diagnosis present

## 2020-02-23 DIAGNOSIS — Z7902 Long term (current) use of antithrombotics/antiplatelets: Secondary | ICD-10-CM

## 2020-02-23 DIAGNOSIS — F419 Anxiety disorder, unspecified: Secondary | ICD-10-CM | POA: Diagnosis present

## 2020-02-23 DIAGNOSIS — T82898A Other specified complication of vascular prosthetic devices, implants and grafts, initial encounter: Secondary | ICD-10-CM | POA: Diagnosis not present

## 2020-02-23 DIAGNOSIS — N179 Acute kidney failure, unspecified: Secondary | ICD-10-CM | POA: Diagnosis present

## 2020-02-23 DIAGNOSIS — E669 Obesity, unspecified: Secondary | ICD-10-CM | POA: Diagnosis present

## 2020-02-23 DIAGNOSIS — E11621 Type 2 diabetes mellitus with foot ulcer: Secondary | ICD-10-CM | POA: Diagnosis present

## 2020-02-23 DIAGNOSIS — E1159 Type 2 diabetes mellitus with other circulatory complications: Secondary | ICD-10-CM

## 2020-02-23 DIAGNOSIS — R410 Disorientation, unspecified: Secondary | ICD-10-CM | POA: Diagnosis not present

## 2020-02-23 DIAGNOSIS — R7401 Elevation of levels of liver transaminase levels: Secondary | ICD-10-CM

## 2020-02-23 DIAGNOSIS — R6521 Severe sepsis with septic shock: Secondary | ICD-10-CM | POA: Diagnosis not present

## 2020-02-23 DIAGNOSIS — L97529 Non-pressure chronic ulcer of other part of left foot with unspecified severity: Secondary | ICD-10-CM | POA: Diagnosis not present

## 2020-02-23 DIAGNOSIS — M79605 Pain in left leg: Secondary | ICD-10-CM

## 2020-02-23 DIAGNOSIS — G4733 Obstructive sleep apnea (adult) (pediatric): Secondary | ICD-10-CM | POA: Diagnosis present

## 2020-02-23 DIAGNOSIS — F05 Delirium due to known physiological condition: Secondary | ICD-10-CM | POA: Diagnosis present

## 2020-02-23 DIAGNOSIS — L03116 Cellulitis of left lower limb: Secondary | ICD-10-CM | POA: Diagnosis present

## 2020-02-23 DIAGNOSIS — Z794 Long term (current) use of insulin: Secondary | ICD-10-CM

## 2020-02-23 DIAGNOSIS — I82819 Embolism and thrombosis of superficial veins of unspecified lower extremities: Secondary | ICD-10-CM | POA: Diagnosis present

## 2020-02-23 DIAGNOSIS — R000 Tachycardia, unspecified: Secondary | ICD-10-CM | POA: Diagnosis present

## 2020-02-23 DIAGNOSIS — F32A Depression, unspecified: Secondary | ICD-10-CM | POA: Diagnosis present

## 2020-02-23 DIAGNOSIS — N1831 Chronic kidney disease, stage 3a: Secondary | ICD-10-CM | POA: Diagnosis present

## 2020-02-23 DIAGNOSIS — J810 Acute pulmonary edema: Secondary | ICD-10-CM | POA: Diagnosis not present

## 2020-02-23 DIAGNOSIS — M7989 Other specified soft tissue disorders: Secondary | ICD-10-CM | POA: Diagnosis not present

## 2020-02-23 DIAGNOSIS — I5022 Chronic systolic (congestive) heart failure: Secondary | ICD-10-CM | POA: Diagnosis not present

## 2020-02-23 DIAGNOSIS — G9340 Encephalopathy, unspecified: Secondary | ICD-10-CM | POA: Diagnosis not present

## 2020-02-23 DIAGNOSIS — G8929 Other chronic pain: Secondary | ICD-10-CM | POA: Diagnosis present

## 2020-02-23 DIAGNOSIS — I6340 Cerebral infarction due to embolism of unspecified cerebral artery: Secondary | ICD-10-CM | POA: Diagnosis not present

## 2020-02-23 DIAGNOSIS — J9601 Acute respiratory failure with hypoxia: Secondary | ICD-10-CM | POA: Diagnosis present

## 2020-02-23 DIAGNOSIS — R471 Dysarthria and anarthria: Secondary | ICD-10-CM | POA: Diagnosis not present

## 2020-02-23 DIAGNOSIS — Z853 Personal history of malignant neoplasm of breast: Secondary | ICD-10-CM

## 2020-02-23 DIAGNOSIS — I330 Acute and subacute infective endocarditis: Secondary | ICD-10-CM | POA: Diagnosis present

## 2020-02-23 DIAGNOSIS — I2510 Atherosclerotic heart disease of native coronary artery without angina pectoris: Secondary | ICD-10-CM | POA: Diagnosis present

## 2020-02-23 DIAGNOSIS — G9341 Metabolic encephalopathy: Secondary | ICD-10-CM | POA: Diagnosis present

## 2020-02-23 DIAGNOSIS — Z833 Family history of diabetes mellitus: Secondary | ICD-10-CM

## 2020-02-23 DIAGNOSIS — K7200 Acute and subacute hepatic failure without coma: Secondary | ICD-10-CM | POA: Diagnosis not present

## 2020-02-23 DIAGNOSIS — J441 Chronic obstructive pulmonary disease with (acute) exacerbation: Secondary | ICD-10-CM | POA: Diagnosis not present

## 2020-02-23 DIAGNOSIS — J90 Pleural effusion, not elsewhere classified: Secondary | ICD-10-CM | POA: Diagnosis not present

## 2020-02-23 DIAGNOSIS — E785 Hyperlipidemia, unspecified: Secondary | ICD-10-CM | POA: Diagnosis present

## 2020-02-23 DIAGNOSIS — I130 Hypertensive heart and chronic kidney disease with heart failure and stage 1 through stage 4 chronic kidney disease, or unspecified chronic kidney disease: Secondary | ICD-10-CM | POA: Diagnosis present

## 2020-02-23 DIAGNOSIS — IMO0002 Reserved for concepts with insufficient information to code with codable children: Secondary | ICD-10-CM

## 2020-02-23 DIAGNOSIS — F17210 Nicotine dependence, cigarettes, uncomplicated: Secondary | ICD-10-CM | POA: Diagnosis present

## 2020-02-23 DIAGNOSIS — E872 Acidosis: Secondary | ICD-10-CM | POA: Diagnosis present

## 2020-02-23 DIAGNOSIS — I639 Cerebral infarction, unspecified: Secondary | ICD-10-CM | POA: Diagnosis not present

## 2020-02-23 DIAGNOSIS — K860 Alcohol-induced chronic pancreatitis: Secondary | ICD-10-CM | POA: Diagnosis present

## 2020-02-23 DIAGNOSIS — E1151 Type 2 diabetes mellitus with diabetic peripheral angiopathy without gangrene: Secondary | ICD-10-CM | POA: Diagnosis present

## 2020-02-23 DIAGNOSIS — R0902 Hypoxemia: Secondary | ICD-10-CM

## 2020-02-23 DIAGNOSIS — E860 Dehydration: Secondary | ICD-10-CM | POA: Diagnosis present

## 2020-02-23 DIAGNOSIS — E876 Hypokalemia: Secondary | ICD-10-CM | POA: Diagnosis present

## 2020-02-23 DIAGNOSIS — K828 Other specified diseases of gallbladder: Secondary | ICD-10-CM | POA: Diagnosis present

## 2020-02-23 DIAGNOSIS — E1165 Type 2 diabetes mellitus with hyperglycemia: Secondary | ICD-10-CM | POA: Diagnosis present

## 2020-02-23 DIAGNOSIS — R0602 Shortness of breath: Secondary | ICD-10-CM

## 2020-02-23 DIAGNOSIS — I998 Other disorder of circulatory system: Secondary | ICD-10-CM | POA: Diagnosis not present

## 2020-02-23 DIAGNOSIS — Z79899 Other long term (current) drug therapy: Secondary | ICD-10-CM

## 2020-02-23 DIAGNOSIS — R1011 Right upper quadrant pain: Secondary | ICD-10-CM | POA: Diagnosis not present

## 2020-02-23 DIAGNOSIS — E1122 Type 2 diabetes mellitus with diabetic chronic kidney disease: Secondary | ICD-10-CM | POA: Diagnosis present

## 2020-02-23 DIAGNOSIS — Z8249 Family history of ischemic heart disease and other diseases of the circulatory system: Secondary | ICD-10-CM

## 2020-02-23 DIAGNOSIS — I10 Essential (primary) hypertension: Secondary | ICD-10-CM | POA: Diagnosis not present

## 2020-02-23 DIAGNOSIS — M109 Gout, unspecified: Secondary | ICD-10-CM | POA: Diagnosis present

## 2020-02-23 DIAGNOSIS — I63431 Cerebral infarction due to embolism of right posterior cerebral artery: Secondary | ICD-10-CM | POA: Diagnosis not present

## 2020-02-23 DIAGNOSIS — D539 Nutritional anemia, unspecified: Secondary | ICD-10-CM | POA: Diagnosis present

## 2020-02-23 DIAGNOSIS — I6389 Other cerebral infarction: Secondary | ICD-10-CM | POA: Diagnosis not present

## 2020-02-23 DIAGNOSIS — Z886 Allergy status to analgesic agent status: Secondary | ICD-10-CM

## 2020-02-23 DIAGNOSIS — A4153 Sepsis due to Serratia: Secondary | ICD-10-CM | POA: Diagnosis present

## 2020-02-23 DIAGNOSIS — T508X5A Adverse effect of diagnostic agents, initial encounter: Secondary | ICD-10-CM | POA: Diagnosis present

## 2020-02-23 DIAGNOSIS — A419 Sepsis, unspecified organism: Secondary | ICD-10-CM | POA: Diagnosis not present

## 2020-02-23 DIAGNOSIS — I82612 Acute embolism and thrombosis of superficial veins of left upper extremity: Secondary | ICD-10-CM | POA: Diagnosis present

## 2020-02-23 DIAGNOSIS — R269 Unspecified abnormalities of gait and mobility: Secondary | ICD-10-CM | POA: Diagnosis present

## 2020-02-23 DIAGNOSIS — I739 Peripheral vascular disease, unspecified: Secondary | ICD-10-CM | POA: Diagnosis not present

## 2020-02-23 DIAGNOSIS — R6520 Severe sepsis without septic shock: Secondary | ICD-10-CM | POA: Diagnosis not present

## 2020-02-23 DIAGNOSIS — Z7951 Long term (current) use of inhaled steroids: Secondary | ICD-10-CM

## 2020-02-23 DIAGNOSIS — Z20822 Contact with and (suspected) exposure to covid-19: Secondary | ICD-10-CM | POA: Diagnosis present

## 2020-02-23 DIAGNOSIS — R609 Edema, unspecified: Secondary | ICD-10-CM | POA: Diagnosis not present

## 2020-02-23 DIAGNOSIS — S91302A Unspecified open wound, left foot, initial encounter: Secondary | ICD-10-CM | POA: Diagnosis present

## 2020-02-23 DIAGNOSIS — I70244 Atherosclerosis of native arteries of left leg with ulceration of heel and midfoot: Secondary | ICD-10-CM | POA: Diagnosis not present

## 2020-02-23 DIAGNOSIS — I252 Old myocardial infarction: Secondary | ICD-10-CM

## 2020-02-23 DIAGNOSIS — Z9011 Acquired absence of right breast and nipple: Secondary | ICD-10-CM

## 2020-02-23 DIAGNOSIS — F1410 Cocaine abuse, uncomplicated: Secondary | ICD-10-CM | POA: Diagnosis present

## 2020-02-23 DIAGNOSIS — N141 Nephropathy induced by other drugs, medicaments and biological substances: Secondary | ICD-10-CM | POA: Diagnosis present

## 2020-02-23 DIAGNOSIS — J449 Chronic obstructive pulmonary disease, unspecified: Secondary | ICD-10-CM | POA: Diagnosis present

## 2020-02-23 DIAGNOSIS — D631 Anemia in chronic kidney disease: Secondary | ICD-10-CM | POA: Diagnosis present

## 2020-02-23 DIAGNOSIS — R29713 NIHSS score 13: Secondary | ICD-10-CM | POA: Diagnosis not present

## 2020-02-23 DIAGNOSIS — Z8719 Personal history of other diseases of the digestive system: Secondary | ICD-10-CM

## 2020-02-23 HISTORY — DX: Acute pancreatitis without necrosis or infection, unspecified: K85.90

## 2020-02-23 LAB — COMPREHENSIVE METABOLIC PANEL
ALT: 18 U/L (ref 0–44)
AST: 18 U/L (ref 15–41)
Albumin: 2.6 g/dL — ABNORMAL LOW (ref 3.5–5.0)
Alkaline Phosphatase: 158 U/L — ABNORMAL HIGH (ref 38–126)
Anion gap: 10 (ref 5–15)
BUN: 16 mg/dL (ref 6–20)
CO2: 26 mmol/L (ref 22–32)
Calcium: 8.3 mg/dL — ABNORMAL LOW (ref 8.9–10.3)
Chloride: 97 mmol/L — ABNORMAL LOW (ref 98–111)
Creatinine, Ser: 1.17 mg/dL — ABNORMAL HIGH (ref 0.44–1.00)
GFR, Estimated: 54 mL/min — ABNORMAL LOW (ref >60)
Glucose, Bld: 439 mg/dL — ABNORMAL HIGH (ref 70–99)
Potassium: 3.7 mmol/L (ref 3.5–5.1)
Sodium: 133 mmol/L — ABNORMAL LOW (ref 135–145)
Total Bilirubin: 0.6 mg/dL (ref 0.3–1.2)
Total Protein: 7.3 g/dL (ref 6.5–8.1)

## 2020-02-23 LAB — CBC WITH DIFFERENTIAL/PLATELET
Abs Immature Granulocytes: 0.04 10*3/uL (ref 0.00–0.07)
Basophils Absolute: 0 10*3/uL (ref 0.0–0.1)
Basophils Relative: 0 %
Eosinophils Absolute: 0.1 10*3/uL (ref 0.0–0.5)
Eosinophils Relative: 0 %
HCT: 40.4 % (ref 36.0–46.0)
Hemoglobin: 12.4 g/dL (ref 12.0–15.0)
Immature Granulocytes: 0 %
Lymphocytes Relative: 13 %
Lymphs Abs: 1.4 10*3/uL (ref 0.7–4.0)
MCH: 25.2 pg — ABNORMAL LOW (ref 26.0–34.0)
MCHC: 30.7 g/dL (ref 30.0–36.0)
MCV: 82.1 fL (ref 80.0–100.0)
Monocytes Absolute: 0.5 10*3/uL (ref 0.1–1.0)
Monocytes Relative: 4 %
Neutro Abs: 9.3 10*3/uL — ABNORMAL HIGH (ref 1.7–7.7)
Neutrophils Relative %: 83 %
Platelets: 419 10*3/uL — ABNORMAL HIGH (ref 150–400)
RBC: 4.92 MIL/uL (ref 3.87–5.11)
RDW: 18.8 % — ABNORMAL HIGH (ref 11.5–15.5)
WBC: 11.4 10*3/uL — ABNORMAL HIGH (ref 4.0–10.5)
nRBC: 0 % (ref 0.0–0.2)

## 2020-02-23 LAB — CBG MONITORING, ED
Glucose-Capillary: 406 mg/dL — ABNORMAL HIGH (ref 70–99)
Glucose-Capillary: 267 mg/dL — ABNORMAL HIGH (ref 70–99)
Glucose-Capillary: 172 mg/dL — ABNORMAL HIGH (ref 70–99)
Glucose-Capillary: 181 mg/dL — ABNORMAL HIGH (ref 70–99)

## 2020-02-23 LAB — URINALYSIS, ROUTINE W REFLEX MICROSCOPIC
Bacteria, UA: NONE SEEN
Bilirubin Urine: NEGATIVE
Glucose, UA: >=500 mg/dL — AB
Ketones, ur: NEGATIVE mg/dL
Leukocytes,Ua: NEGATIVE
Nitrite: NEGATIVE
Protein, ur: >=300 mg/dL — AB
Specific Gravity, Urine: 1.021 (ref 1.005–1.030)
pH: 7 (ref 5.0–8.0)

## 2020-02-23 LAB — LACTIC ACID, PLASMA
Lactic Acid, Venous: 2.6 mmol/L (ref 0.5–1.9)
Lactic Acid, Venous: >11 mmol/L (ref 0.5–1.9)
Lactic Acid, Venous: 3.6 mmol/L (ref 0.5–1.9)
Lactic Acid, Venous: >11 mmol/L (ref 0.5–1.9)

## 2020-02-23 LAB — RESPIRATORY PANEL BY RT PCR (FLU A&B, COVID)
Influenza A by PCR: NEGATIVE
Influenza B by PCR: NEGATIVE
SARS Coronavirus 2 by RT PCR: NEGATIVE

## 2020-02-23 LAB — PROTIME-INR
INR: 1.2 (ref 0.8–1.2)
Prothrombin Time: 14.9 seconds (ref 11.4–15.2)

## 2020-02-23 LAB — BRAIN NATRIURETIC PEPTIDE: B Natriuretic Peptide: 868.5 pg/mL — ABNORMAL HIGH (ref 0.0–100.0)

## 2020-02-23 LAB — PROCALCITONIN: Procalcitonin: <0.1 ng/mL

## 2020-02-23 LAB — I-STAT BETA HCG BLOOD, ED (MC, WL, AP ONLY): I-stat hCG, quantitative: <5 m[IU]/mL (ref <5)

## 2020-02-23 LAB — APTT: aPTT: 27 seconds (ref 24–36)

## 2020-02-23 MED ORDER — INSULIN ASPART 100 UNIT/ML ~~LOC~~ SOLN
0.0000 [IU] | Freq: Three times a day (TID) | SUBCUTANEOUS | Status: DC
  Administered 2020-02-23: 4 [IU] via SUBCUTANEOUS
  Filled 2020-02-23: qty 0.2

## 2020-02-23 MED ORDER — DEXTROSE 50 % IV SOLN
0.0000 mL | INTRAVENOUS | Status: DC | PRN
  Administered 2020-02-27 – 2020-02-29 (×3): 50 mL via INTRAVENOUS
  Administered 2020-03-10: 25 mL via INTRAVENOUS
  Filled 2020-02-23 (×5): qty 50

## 2020-02-23 MED ORDER — PANCRELIPASE (LIP-PROT-AMYL) 36000-114000 UNITS PO CPEP
72000.0000 [IU] | ORAL_CAPSULE | Freq: Three times a day (TID) | ORAL | Status: DC
  Administered 2020-02-24 – 2020-03-18 (×56): 72000 [IU] via ORAL
  Filled 2020-02-23 (×74): qty 2

## 2020-02-23 MED ORDER — ROSUVASTATIN CALCIUM 20 MG PO TABS
40.0000 mg | ORAL_TABLET | Freq: Every day | ORAL | Status: DC
  Administered 2020-02-24 – 2020-02-27 (×4): 40 mg via ORAL
  Filled 2020-02-23 (×4): qty 2

## 2020-02-23 MED ORDER — INSULIN ASPART 100 UNIT/ML ~~LOC~~ SOLN
14.0000 [IU] | Freq: Once | SUBCUTANEOUS | Status: AC
  Administered 2020-02-23: 14 [IU] via SUBCUTANEOUS
  Filled 2020-02-23: qty 0.14

## 2020-02-23 MED ORDER — ONDANSETRON HCL 4 MG/2ML IJ SOLN
4.0000 mg | Freq: Four times a day (QID) | INTRAMUSCULAR | Status: DC | PRN
  Administered 2020-02-25: 4 mg via INTRAVENOUS
  Filled 2020-02-23: qty 2

## 2020-02-23 MED ORDER — LACTATED RINGERS IV SOLN: INTRAVENOUS | Status: DC

## 2020-02-23 MED ORDER — METRONIDAZOLE IN NACL 5-0.79 MG/ML-% IV SOLN
500.0000 mg | Freq: Three times a day (TID) | INTRAVENOUS | Status: DC
  Administered 2020-02-23 – 2020-02-25 (×7): 500 mg via INTRAVENOUS
  Filled 2020-02-23 (×7): qty 100

## 2020-02-23 MED ORDER — LACTATED RINGERS IV BOLUS
2000.0000 mL | Freq: Once | INTRAVENOUS | Status: AC
  Administered 2020-02-23: 2000 mL via INTRAVENOUS

## 2020-02-23 MED ORDER — ONDANSETRON HCL 4 MG PO TABS: 4.0000 mg | ORAL_TABLET | Freq: Four times a day (QID) | ORAL | Status: DC | PRN

## 2020-02-23 MED ORDER — PANCRELIPASE (LIP-PROT-AMYL) 36000-114000 UNITS PO CPEP: 36000.0000 [IU] | ORAL_CAPSULE | ORAL | Status: DC

## 2020-02-23 MED ORDER — LACTATED RINGERS IV BOLUS
1000.0000 mL | Freq: Once | INTRAVENOUS | Status: AC
  Administered 2020-02-23: 1000 mL via INTRAVENOUS

## 2020-02-23 MED ORDER — EZETIMIBE 10 MG PO TABS: 10.0000 mg | ORAL_TABLET | Freq: Every day | ORAL | Status: DC

## 2020-02-23 MED ORDER — GABAPENTIN 400 MG PO CAPS
800.0000 mg | ORAL_CAPSULE | Freq: Three times a day (TID) | ORAL | Status: DC
  Administered 2020-02-23 – 2020-02-28 (×12): 800 mg via ORAL
  Filled 2020-02-23 (×13): qty 2

## 2020-02-23 MED ORDER — INSULIN ASPART 100 UNIT/ML ~~LOC~~ SOLN
0.0000 [IU] | Freq: Every day | SUBCUTANEOUS | Status: DC
  Filled 2020-02-23: qty 0.05

## 2020-02-23 MED ORDER — SODIUM CHLORIDE 0.9 % IV SOLN
2.0000 g | INTRAVENOUS | Status: DC
  Administered 2020-02-23: 2 g via INTRAVENOUS
  Filled 2020-02-23: qty 20

## 2020-02-23 MED ORDER — ACETAMINOPHEN 650 MG RE SUPP: 650.0000 mg | Freq: Four times a day (QID) | RECTAL | Status: DC | PRN

## 2020-02-23 MED ORDER — DEXTROSE IN LACTATED RINGERS 5 % IV SOLN: INTRAVENOUS | Status: DC

## 2020-02-23 MED ORDER — INSULIN REGULAR(HUMAN) IN NACL 100-0.9 UT/100ML-% IV SOLN: INTRAVENOUS | Status: DC

## 2020-02-23 MED ORDER — PANCRELIPASE (LIP-PROT-AMYL) 36000-114000 UNITS PO CPEP
36000.0000 [IU] | ORAL_CAPSULE | Freq: Two times a day (BID) | ORAL | Status: DC | PRN
  Filled 2020-02-23 (×2): qty 1

## 2020-02-23 MED ORDER — HYDROMORPHONE HCL 1 MG/ML IJ SOLN
1.0000 mg | Freq: Once | INTRAMUSCULAR | Status: AC
  Administered 2020-02-23: 1 mg via INTRAVENOUS
  Filled 2020-02-23: qty 1

## 2020-02-23 MED ORDER — OXYCODONE HCL ER 10 MG PO T12A
10.0000 mg | EXTENDED_RELEASE_TABLET | Freq: Three times a day (TID) | ORAL | Status: DC
  Administered 2020-02-23 – 2020-02-24 (×3): 10 mg via ORAL
  Filled 2020-02-23 (×4): qty 1

## 2020-02-23 MED ORDER — SODIUM CHLORIDE 0.9% FLUSH
3.0000 mL | Freq: Two times a day (BID) | INTRAVENOUS | Status: DC
  Administered 2020-02-23 – 2020-02-25 (×6): 3 mL via INTRAVENOUS

## 2020-02-23 MED ORDER — LACTATED RINGERS IV BOLUS (SEPSIS): 1000.0000 mL | Freq: Once | INTRAVENOUS | Status: DC

## 2020-02-23 MED ORDER — AMITRIPTYLINE HCL 50 MG PO TABS
200.0000 mg | ORAL_TABLET | Freq: Every day | ORAL | Status: DC
  Administered 2020-02-23 – 2020-02-28 (×3): 200 mg via ORAL
  Filled 2020-02-23: qty 8
  Filled 2020-02-23 (×2): qty 4

## 2020-02-23 MED ORDER — ACETAMINOPHEN 325 MG PO TABS
650.0000 mg | ORAL_TABLET | Freq: Four times a day (QID) | ORAL | Status: DC | PRN
  Administered 2020-02-26 – 2020-03-15 (×8): 650 mg via ORAL
  Filled 2020-02-23 (×9): qty 2

## 2020-02-23 MED ORDER — SODIUM CHLORIDE 0.9 % IV SOLN: INTRAVENOUS | Status: DC

## 2020-02-23 MED ORDER — VANCOMYCIN HCL 1750 MG/350ML IV SOLN
1750.0000 mg | Freq: Once | INTRAVENOUS | Status: AC
  Administered 2020-02-23: 1750 mg via INTRAVENOUS
  Filled 2020-02-23: qty 350

## 2020-02-23 MED ORDER — EZETIMIBE 10 MG PO TABS
10.0000 mg | ORAL_TABLET | Freq: Every day | ORAL | Status: DC
  Administered 2020-02-24 – 2020-02-28 (×5): 10 mg via ORAL
  Filled 2020-02-23 (×6): qty 1

## 2020-02-23 MED ORDER — POLYETHYLENE GLYCOL 3350 17 G PO PACK: 17.0000 g | PACK | Freq: Every day | ORAL | Status: DC | PRN

## 2020-02-23 MED ORDER — INSULIN GLARGINE 100 UNIT/ML ~~LOC~~ SOLN
50.0000 [IU] | Freq: Every day | SUBCUTANEOUS | Status: DC
  Administered 2020-02-23: 50 [IU] via SUBCUTANEOUS
  Filled 2020-02-23 (×2): qty 0.5

## 2020-02-23 NOTE — ED Notes (Signed)
Date and time results received: 02/23/20 1329  Test: Lactic Acid  Critical Value: >11  Name of Provider Notified: Tommi Rumps, MD  Orders Received? Or Actions Taken?: Acknowledged

## 2020-02-23 NOTE — Progress Notes (Signed)
Notified provider of need to draw lactic acid around 1430 since last level increased, made bedside RN aware.

## 2020-02-23 NOTE — Progress Notes (Signed)
A consult was received from an ED physician for vancomycin per pharmacy dosing.  The patient's profile has been reviewed for ht/wt/allergies/indication/available labs.    A one time order has been placed for vancomycin 1750 mg IV x1.  Further antibiotics/pharmacy consults should be ordered by admitting physician if indicated.                       Thank you, Lynelle Doctor 02/23/2020  10:07 AM

## 2020-02-23 NOTE — ED Provider Notes (Signed)
[facility] DEPT Provider Note   CSN: [id number] Arrival date & time: 02/23/20  0909     History Chief Complaint  Patient presents with  . Wound Infection    Morgan Beasley is a 59 y.o. female w/ hx of obesity, diabetes, chronic foot ulcer, NSTEMI (in setting of cocaine use), nonobstructive CAD, diastolic CHF (EF 77-93% in May 2021), HTN, HLD, PAD, presenting to the ED with complaint of left leg and foot pain.  The patient is a poor historian.  She is concerned her left leg pain has gotten significantly worse and presents with intractable pain in her leg, but also stipulating her chronic left foot wound has worsened.  She says she got "bit by something" on the back of her left leg.  She is sobbing and difficult to understand.  She recently underwent aortogram with Dr Servando Snare with vascular surgery and was noted to have persistent lower extremity pain at that time.  His Op note from 12/08/19 reports;  "Findings: The aorta and iliac segments are free of flow-limiting stenosis.  The left side which is the site of interest the bypass takes off from the common femoral artery this is a patent vein all the way to the below-knee popliteal artery.  This is patent without any flow-limiting stenosis including both anastomoses.  Distally she does have disease in the proximal anterior tibial artery this is the main vessel running off but appears to end at the ankle she also has what appears to be a posterior tibial artery which is diminutive only runs to the ankle.  No intervention was undertaken given that these vessels do have runoff are quite diminutive and patient is healing her wounds at this time.  On the right side her SFA is occluded she reconstitutes the popliteal artery she has runoff on that side via anterior tibial and posterior tibial but again these and at the ankle does not have significant flow into the foot.  We will continue to monitor patient's  wounds.  She needs urgent smoking cessation.  She will be high risk for amputation if she cannot heal her wound."  HPI     Past Medical History:  Diagnosis Date  . Alcohol dependence ([place])   . Anemia   . Anxiety   . Breast cancer ([place])   . Chronic combined systolic and diastolic CHF (congestive heart failure) ([place])   . Cigarette nicotine dependence   . CKD (chronic kidney disease), stage III ([place])   . Colon polyps   . COPD (chronic obstructive pulmonary disease) ([name])   . Diabetes mellitus without complication ([place])   . Diabetic neuropathy ([place])   . Elevated lipase   . Gout   . Hyperlipemia   . Hypertension   . Insomnia   . Lymphedema   . Marijuana use   . Mild CAD 2016   a. NSTEMI 2016 in context of cocaine abuse, 50% RCA% at that time.  . OSA treated with BiPAP   . PAD (peripheral artery disease) ([place])    a. s/p L SFA stenting 08/2019. b. left fem-to-below-knee-popliteal bypass 09/2019.  Marland Kitchen Sleep apnea    wears BIPAP  . Ulcer of foot [facility])    right    Patient Active Problem List   Diagnosis Date Noted  . Septic shock ([place]) 02/23/2020  . Chronic systolic CHF (congestive heart failure) ([place]) 02/23/2020  . Obesity (BMI 30-39.9) 02/23/2020  . Ischemia of left lower extremity 10/09/2019  . PVD (peripheral vascular  disease) ([place])   . Diabetic peripheral neuropathy associated with type 2 diabetes mellitus ([place])   . Acute on chronic diastolic congestive heart failure ([place])   . Lower extremity edema   . Dyspnea 09/21/2019  . Bronchitis 02/25/2019  . Uncontrolled diabetes mellitus with circulatory complication, with long-term current use of insulin ([place]) 02/25/2019  . Peripheral neuropathy 02/25/2019  . COPD (chronic obstructive pulmonary disease) ([place]) 02/25/2019  . Malignant neoplasm of upper-outer quadrant of right breast in female, estrogen receptor positive ([place]) 01/08/2019    Past Surgical History:  Procedure Laterality Date  . ABDOMINAL AORTOGRAM W/LOWER EXTREMITY  N/A 09/26/2019   Procedure: ABDOMINAL AORTOGRAM W/LOWER EXTREMITY;  Surgeon: Angelia Mould, MD;  Location: [facility];  Service: Cardiovascular;  Laterality: N/A;  . ABDOMINAL AORTOGRAM W/LOWER EXTREMITY Bilateral 12/08/2019   Procedure: ABDOMINAL AORTOGRAM W/LOWER EXTREMITY;  Surgeon: Waynetta Sandy, MD;  Location: [facility];  Service: Cardiovascular;  Laterality: Bilateral;  . ABDOMINAL AORTOGRAM W/LOWER EXTREMITY Left 01/26/2020   Procedure: ABDOMINAL AORTOGRAM W/LOWER EXTREMITY;  Surgeon: Waynetta Sandy, MD;  Location: [facility];  Service: Cardiovascular;  Laterality: Left;  . ABDOMINAL HYSTERECTOMY    . [facility]  . FEMORAL-POPLITEAL BYPASS GRAFT Left 10/14/2019   Procedure: BYPASS GRAFT LEFT FEMORAL-POPLITEAL ARTERY USING NONREVERSED SAPHENOUS VEIN;  Surgeon: Waynetta Sandy, MD;  Location: [facility];  Service: Vascular;  Laterality: Left;  Marland Kitchen MASTECTOMY Right April 2016  . PERIPHERAL VASCULAR ATHERECTOMY Left 01/26/2020   Procedure: PERIPHERAL VASCULAR ATHERECTOMY;  Surgeon: Waynetta Sandy, MD;  Location: [facility];  Service: Cardiovascular;  Laterality: Left;  PT and AT - [facility]  . PERIPHERAL VASCULAR BALLOON ANGIOPLASTY Left 01/26/2020   Procedure: PERIPHERAL VASCULAR BALLOON ANGIOPLASTY;  Surgeon: Waynetta Sandy, MD;  Location: [facility];  Service: Cardiovascular;  Laterality: Left;  [facility]   . PERIPHERAL VASCULAR INTERVENTION Left 09/26/2019   Procedure: PERIPHERAL VASCULAR INTERVENTION;  Surgeon: Angelia Mould, MD;  Location: [facility];  Service: Cardiovascular;  Laterality: Left;  superficial femoral     OB History   No obstetric history on file.     Family History  Problem Relation Age of Onset  . Diabetes Other   . Heart disease Other   . Breast cancer Maternal Grandmother   . Breast cancer Paternal Grandmother   . Stroke Son   . Colon  cancer Neg Hx   . Esophageal cancer Neg Hx   . Rectal cancer Neg Hx   . Stomach cancer Neg Hx     Social History   Tobacco Use  . Smoking status: Light Tobacco Smoker    Types: Cigarettes    Last attempt to quit: 06/04/2019    Years since quitting: 0.7  . Smokeless tobacco: Never Used  Vaping Use  . Vaping Use: Never used  Substance Use Topics  . Alcohol use: Not Currently    Comment: Beer - "on the weekends hanging out, maybe 2 or 3"   . Drug use: Not Currently    Types: Marijuana    Comment: last used 8 2020    Home Medications Prior to Admission medications   Medication Sig Start Date End Date Taking? Authorizing Provider  albuterol (PROAIR HFA) 108 (90 Base) MCG/ACT inhaler Inhale 2 puffs into the lungs every 6 (six) hours as needed for shortness of breath.  05/19/14   [provider]  allopurinol (ZYLOPRIM) 300 MG tablet  Take 300 mg by mouth daily. 10/23/19   [provider]  amitriptyline (ELAVIL) 100 MG tablet Take 200 mg by mouth at bedtime.    [provider]  budesonide-formoterol (SYMBICORT) 160-4.5 MCG/ACT inhaler Inhale 2 puffs into the lungs 2 (two) times daily.    [provider]  clopidogrel (PLAVIX) 75 MG tablet Take 1 tablet (75 mg total) by mouth daily. 09/27/19 09/26/20  Lyndee Hensen, DO  dapagliflozin propanediol (FARXIGA) 10 MG TABS tablet Take 1 tablet (10 mg total) by mouth daily before breakfast. 09/26/19   Angelia Mould, MD  ezetimibe (ZETIA) 10 MG tablet Take 1 tablet (10 mg total) by mouth daily. Take with largest meal of the day 12/22/19   Fay Records, MD  gabapentin (NEURONTIN) 800 MG tablet Take 800 mg by mouth 3 (three) times daily.    [provider]  glimepiride (AMARYL) 4 MG tablet Take 8 mg by mouth daily with breakfast.     [provider]  insulin glargine (LANTUS) 100 UNIT/ML injection Inject 0.4 mLs (40 Units total) into the skin daily. Patient taking differently: Inject 80 Units  into the skin daily.  09/27/19   Brimage, Ronnette Juniper, DO  insulin lispro (HUMALOG) 100 UNIT/ML injection Inject 14 Units into the skin 3 (three) times daily before meals.     [provider]  lipase/protease/amylase (CREON) 36000 UNITS CPEP capsule Take 2 capsule by mouth with each meal and 1 capsule by mouth with each snack Patient taking differently: Take 36,000-72,000 Units by mouth See admin instructions. Take 72000 units by mouth with each meal and 36000 units by mouth with each snack 09/17/19   Armbruster, Carlota Raspberry, MD  losartan (COZAAR) 50 MG tablet Take 50 mg by mouth daily. 02/11/19   [provider]  metoprolol succinate (TOPROL-XL) 50 MG 24 hr tablet Take 1 tablet (50 mg total) by mouth daily. Take with or immediately following a meal. 12/22/19   Fay Records, MD  Multiple Vitamins-Minerals (ADULT ONE DAILY GUMMIES) CHEW Chew 1 capsule by mouth daily.    [provider]  NARCAN 4 MG/0.1ML LIQD nasal spray kit Place 1 spray into the nose as needed (opioid overdose).  11/25/19   [provider]  Oxycodone HCl 10 MG TABS Take 10 mg by mouth in the morning, at noon, and at bedtime.  11/25/19   [provider]  potassium chloride (K-DUR) 10 MEQ tablet Take 10 mEq by mouth daily as needed (For cramps).     [provider]  rosuvastatin (CRESTOR) 40 MG tablet Take 1 tablet (40 mg total) by mouth daily. 12/22/19   Fay Records, MD  Toremifene Citrate (FARESTON) 60 MG tablet Take 1 tablet (60 mg total) by mouth daily. Patient not taking: Reported on 01/23/2020 09/27/19   Lyndee Hensen, DO  torsemide (DEMADEX) 20 MG tablet Take 2 tablets (40 mg total) by mouth daily. 09/27/19 01/23/20  Lyndee Hensen, DO    Allergies    Tramadol, Nsaids, Tolmetin, and Aspirin  Review of Systems   Review of Systems  Constitutional: Positive for chills and fever.  Eyes: Negative for pain and visual disturbance.  Respiratory: Negative for cough and shortness of  breath.   Cardiovascular: Negative for chest pain and palpitations.  Gastrointestinal: Negative for abdominal pain and vomiting.  Genitourinary: Negative for dysuria and hematuria.  Musculoskeletal: Positive for myalgias. Negative for arthralgias.  Skin: Positive for rash and wound.  Neurological: Negative for syncope and headaches.  Psychiatric/Behavioral: Negative  for agitation and confusion.  All other systems reviewed and are negative.   Physical Exam Updated Vital Signs BP (!) 162/105   Pulse (!) 123   Temp 100.2 F (37.9 C) (Oral)   Resp (!) 25   Ht $R'5\' 6"'UL$  (1.676 m)   Wt 90.3 kg   SpO2 94%   BMI 32.12 kg/m   Physical Exam Vitals and nursing note reviewed.  Constitutional:      General: She is in acute distress.     Appearance: She is well-developed.  HENT:     Head: Normocephalic and atraumatic.  Eyes:     Conjunctiva/sclera: Conjunctivae normal.  Cardiovascular:     Rate and Rhythm: Normal rate and regular rhythm.     Pulses: Normal pulses.     Comments: Pedal pulses audible by doppler Pulmonary:     Effort: Pulmonary effort is normal. No respiratory distress.     Breath sounds: Normal breath sounds.  Abdominal:     General: There is no distension.     Palpations: Abdomen is soft.  Musculoskeletal:     Cervical back: Neck supple.  Skin:    General: Skin is warm and dry.     Comments: Bilateral lower extremity vascular incision sites clean, nonerythematous, intact Open ulceration on dorsum of left foot (see photo below)  Neurological:     General: No focal deficit present.     Mental Status: She is alert and oriented to person, place, and time.        ED Results / Procedures / Treatments   Labs (all labs ordered are listed, but only abnormal results are displayed) Labs Reviewed  LACTIC ACID, PLASMA - Abnormal; Notable for the following components:      Result Value   Lactic Acid, Venous 2.6 (*)    All other components within normal limits  LACTIC  ACID, PLASMA - Abnormal; Notable for the following components:   Lactic Acid, Venous >11.0 (*)    All other components within normal limits  COMPREHENSIVE METABOLIC PANEL - Abnormal; Notable for the following components:   Sodium 133 (*)    Chloride 97 (*)    Glucose, Bld 439 (*)    Creatinine, Ser 1.17 (*)    Calcium 8.3 (*)    Albumin 2.6 (*)    Alkaline Phosphatase 158 (*)    GFR, Estimated 54 (*)    All other components within normal limits  CBC WITH DIFFERENTIAL/PLATELET - Abnormal; Notable for the following components:   WBC 11.4 (*)    MCH 25.2 (*)    RDW 18.8 (*)    Platelets 419 (*)    Neutro Abs 9.3 (*)    All other components within normal limits  URINALYSIS, ROUTINE W REFLEX MICROSCOPIC - Abnormal; Notable for the following components:   Glucose, UA >=500 (*)    Hgb urine dipstick SMALL (*)    Protein, ur >=300 (*)    All other components within normal limits  CBG MONITORING, ED - Abnormal; Notable for the following components:   Glucose-Capillary 406 (*)    All other components within normal limits  CBG MONITORING, ED - Abnormal; Notable for the following components:   Glucose-Capillary 267 (*)    All other components within normal limits  CULTURE, BLOOD (ROUTINE X 2)  RESPIRATORY PANEL BY RT PCR (FLU A&B, COVID)  CULTURE, BLOOD (ROUTINE X 2)  URINE CULTURE  PROTIME-INR  APTT  PROCALCITONIN  HEMOGLOBIN A1C  BRAIN NATRIURETIC PEPTIDE  I-STAT BETA HCG  BLOOD, ED ([facility], [facility], [facility] ONLY)    EKG EKG Interpretation  Date/Time:  Monday February 23 2020 10:00:50 EDT Ventricular Rate:  118 PR Interval:    QRS Duration: 94 QT Interval:  399 QTC Calculation: 560 R Axis:   116 Text Interpretation: Sinus tachycardia Right axis deviation Borderline low voltage, extremity leads Nonspecific T abnormalities, inferior leads Prolonged QT interval Confirmed by Octaviano Glow [phone]) on 02/23/2020 10:23:25 AM   Radiology DG Chest Port 1 View  Result Date:  02/23/2020 CLINICAL DATA:  Concern for sepsis EXAM: PORTABLE CHEST 1 VIEW COMPARISON:  10/09/2019 FINDINGS: Increased bibasilar airspace opacities worse on the left obscuring the left hemidiaphragm concerning for bibasilar pneumonia. Stable cardiomegaly. No CHF pattern or large effusion. No pneumothorax. Aorta atherosclerotic. IMPRESSION: Bibasilar airspace process, worse on the left concerning for pneumonia. Electronically Signed   By: Jerilynn Mages.  Shick M.D.   On: 02/23/2020 10:24   DG Foot 2 Views Left  Result Date: 02/23/2020 CLINICAL DATA:  Chronic wound EXAM: LEFT FOOT - 2 VIEW COMPARISON:  Sep 21, 2019 FINDINGS: Frontal and lateral views were obtained. There is soft tissue swelling. There is suggestion of soft tissue air medial to the distal aspect of the first metatarsal. There is no erosive change or osteomyelitis. No fracture or dislocation. Joint spaces overall appear unremarkable. There are prominent posterior and inferior calcaneal spurs. IMPRESSION: Soft tissue swelling with soft tissue air medial to the mid to distal first metatarsal. No bony destruction or erosion. No appreciable joint space narrowing. There are calcaneal spurs. No fracture or dislocation evident. Electronically Signed   By: Lowella Grip III M.D.   On: 02/23/2020 10:22    Procedures .Critical Care Performed by: Wyvonnia Dusky, MD Authorized by: Wyvonnia Dusky, MD   Critical care provider statement:    Critical care time (minutes):  45   Critical care was necessary to treat or prevent imminent or life-threatening deterioration of the following conditions:  Sepsis   Critical care was time spent personally by me on the following activities:  Discussions with consultants, evaluation of patient's response to treatment, examination of patient, ordering and performing treatments and interventions, ordering and review of laboratory studies, ordering and review of radiographic studies, pulse oximetry, re-evaluation of  patient's condition, obtaining history from patient or surrogate and review of old charts   (including critical care time)  Medications Ordered in ED Medications  cefTRIAXone (ROCEPHIN) 2 g in sodium chloride 0.9 % 100 mL IVPB (0 g Intravenous Stopped 02/23/20 1119)  metroNIDAZOLE (FLAGYL) IVPB 500 mg (0 mg Intravenous Stopped 02/23/20 1216)  sodium chloride flush (NS) 0.9 % injection 3 mL (has no administration in time range)  lactated ringers bolus 1,000 mL (has no administration in time range)    And  lactated ringers bolus 1,000 mL (has no administration in time range)    And  lactated ringers bolus 1,000 mL (has no administration in time range)  0.9 %  sodium chloride infusion (has no administration in time range)  acetaminophen (TYLENOL) tablet 650 mg (has no administration in time range)    Or  acetaminophen (TYLENOL) suppository 650 mg (has no administration in time range)  ondansetron (ZOFRAN) tablet 4 mg (has no administration in time range)    Or  ondansetron (ZOFRAN) injection 4 mg (has no administration in time range)  polyethylene glycol (MIRALAX / GLYCOLAX) packet 17 g (has no administration in time range)  gabapentin (NEURONTIN) tablet 800 mg (has no administration in time range)  ezetimibe (ZETIA) tablet 10 mg (has no administration in time range)  amitriptyline (ELAVIL) tablet 200 mg (has no administration in time range)  lipase/protease/amylase (CREON) capsule 36,000-72,000 Units (has no administration in time range)  rosuvastatin (CRESTOR) tablet 40 mg (has no administration in time range)  insulin regular, human (MYXREDLIN) 100 units/ 100 mL infusion (has no administration in time range)  dextrose 5 % in lactated ringers infusion (has no administration in time range)  dextrose 50 % solution 0-50 mL (has no administration in time range)  lactated ringers infusion (has no administration in time range)  HYDROmorphone (DILAUDID) injection 1 mg (1 mg Intravenous Given  02/23/20 1022)  lactated ringers bolus 2,000 mL (0 mLs Intravenous Stopped 02/23/20 1347)  vancomycin (VANCOREADY) IVPB 1750 mg/350 mL (0 mg Intravenous Stopped 02/23/20 1347)  insulin aspart (novoLOG) injection 14 Units (14 Units Subcutaneous Given 02/23/20 1154)  lactated ringers bolus 1,000 mL (1,000 mLs Intravenous New Bag/Given 02/23/20 1345)    ED Course  I have reviewed the triage vital signs and the nursing notes.  Pertinent labs & imaging results that were available during my care of the patient were reviewed by me and considered in my medical decision making (see chart for details).  59 yo female w/ complicated pmhx presenting with fever, tachycardia, and open left foot ulceration, concerning for sepsis and possible osteomyelitis.  Labs and xrays ordered.  IV antibiotics ordered and IV fluids per IBW sepsis guidelines.  She has dopplerable pulses in the lower extremities.  She is reporting intractable pain in her legs -from chart review it appears she always has pain in her lower extremities.  She has an audible pulse in her lower extremities on doppler - I have a low initial suspicion for critical vascular occlusion.  This does not appear to be PCD clinically.   I'll check her kidney function and discuss with vascular surgery whether they need repeat CTA of the lower extremities at this time.  I ordered, reviewed, and interpreted labs, which included lactate 2.6, WBC 11.4, Na 133, K 3.7, Glucose 439, Hgb 12.4. I ordered medication IV dilaudid for pain, IV fluids and IV antibiotics for sepsis I ordered imaging studies which included xray of the foot I independently visualized and interpreted imaging which showed bilateral airspace opacities  Previous records obtained and reviewed showing vascular surgery operative notes I consulted vascular surgery and discussed lab and imaging findings  On reassessment after 1 mg IV dilaudid patient's pain had dramatically improved.  She was  admitted to the medical service with plans for sepsis management and possible transfer for vascular consultation.   Clinical Course as of Feb 23 1519  Mon Feb 23, 2020  1884 Sepsis protocol initiated.  IBW is 60 kg.  Fluid bolus of 2000 ml LR ordered (approx 1800 ml needed for 30 cc/kg bolus per IBW).  Antibiotics ordered   [MT]  1118 Called vascular clinic, awaiting physician callback   [MT]  1139 I spoke to Dr Nona Dell team in the [facility] currently - they recommended medical admission and transfer to [facility] for vascular consult.  If patient has pulses no emergent indication for CTA at this time.  They will evaluate the patient and determine further need for imaging.     [MT]  1140 On reassessment patient reporting her pain has significantly improved after IV dilaudid.  She is getting her antibiotics.  Hospitalist admission paged out.   [MT]  1660 Signed out to hospitalist   [MT]    Clinical  Course User Index [MT] Wyvonnia Dusky, MD    Final Clinical Impression(s) / ED Diagnoses Final diagnoses:  Ulcer of left foot, unspecified ulcer stage ([place])  Sepsis, due to unspecified organism, unspecified whether acute organ dysfunction present [facility])  Pain of left lower extremity    Rx / DC Orders ED Discharge Orders    None       Wyvonnia Dusky, MD 02/23/20 1520

## 2020-02-23 NOTE — Progress Notes (Signed)
Inpatient Diabetes Program Recommendations  AACE/ADA: New Consensus Statement on Inpatient Glycemic Control (2015)  Target Ranges:  Prepandial:   less than 140 mg/dL      Peak postprandial:   less than 180 mg/dL (1-2 hours)      Critically ill patients:  140 - 180 mg/dL   Results for Morgan Beasley, Morgan Beasley (MRN [id number]) as of 02/23/2020 16:06  Ref. Range 02/23/2020 09:30 02/23/2020 15:00  Glucose-Capillary Latest Ref Range: 70 - 99 mg/dL 406 (H)  14 units NOVOLOG @11 :54am 267 (H)    To ED with LLE foot Wound/ Sepsis/ Elevated Lactic Acid.   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), Diastolic CHF, CKD   Home DM Meds: Lantus 80 units Daily        Humalog 14 units TID        Amaryl 8 mg Daily        Farxiga 10 mg Daily  Current Orders: IV Insulin Drip     Lab glucose 439 at 10am--Pt given 14 units Novolog X 1 dose at 12pm and also got 2L LR IVF bolus  Note that IV Insulin Drip was ordered but has not been started yet.  Per MAR, hold the Drip per MD orders.  May want to check another BMET to make sure labs are OK.  If labs are OK, could restart Lantus and Novolog (would start with 80% home dose of Lantus and Novolog 0-15 units (SSI) Q4 hours)     --Will follow patient during hospitalization--  Wyn Quaker RN, MSN, CDE Diabetes Coordinator Inpatient Glycemic Control Team Team Pager: [phone] (8a-5p)

## 2020-02-23 NOTE — ED Notes (Signed)
Pt. CBG 181, RN, Sargeant aware.

## 2020-02-23 NOTE — ED Notes (Signed)
CRITICAL VALUE ALERT  Critical Value:  Lactic acid 2.6  Date & Time Notied:  02/23/2020 1038  Provider Notified: Tyrone Nine, EDP  Orders Received/Actions taken: acknowledged

## 2020-02-23 NOTE — Progress Notes (Signed)
Notified bedside nurse of need to draw repeat lactic acid (third lactate).

## 2020-02-23 NOTE — Progress Notes (Signed)
Notified bedside nurse of need to administer fluid bolus needs 2709cc, has had 2550cc.

## 2020-02-23 NOTE — ED Notes (Signed)
Pt reports having bypass back in July and states her wounds from the bypass have never healed. Pt has open wound on top of left foot and has sores on the upper leg. Pt states her MD told her she may have to get her left foot cut off. Pt was sent here from her PCP.

## 2020-02-23 NOTE — H&P (Addendum)
History and Physical  Morgan Beasley YTK:[id number] DOB: 1961/04/15 DOA: 02/23/2020  Referring physician: Myrtie Cruise, ER Physician PCP: Riki Sheer, NP  Outpatient Specialists: Servando Snare, Vascular Surgery Patient coming from: Home with limited ambulatory activity  Chief Complaint: Wound infection  HPI: Morgan Beasley is a 59 y.o. female with past medical history of obesity, poorly controlled diabetes mellitus, CAD, systolic CHF and hypertension plus PVD/PAD status post angioplasty and stent placement in the past who presented the emergency room complaining of left leg and foot pain.  Of note, patient is a poor historian, so difficult to understand.  She was told the ER doctor she was bitten by something, although no wound was noted.  Patient has been followed by vascular surgery for a poorly healing wound and PAD/PVD.  ED Course: In the emergency room, lab work consistent with sepsis.  Chest x-ray noted questionable infiltrate although procalcitonin level later checked found to be less than 0.1.  Initial lactic acid level at 2.6 and white count minimally elevated at 11.4.  BNP on admission at 869.  Patient noted to be tachycardic and tachypneic with low-grade fever.  Blood cultures were drawn and she was started on fluids and antibiotics as per sepsis protocol.  Repeat lactic acid level 2 hours later noted to be greater than 11.  Patient received even more volume resuscitation and current lactic acid level is pending.  ER discussed case with vascular surgery recommended transfer to [facility] from [facility], ER so that they could observe her.  Review of Systems: Patient seen in the emergency room. Pt is fatigued and does not say very much.  Does complain of leg pain.  She seems drowsy although the nurse, she just received pain medication.  Not really able to get a review of systems from her.    Past Medical History:  Diagnosis Date  . Alcohol dependence ([place])   .  Anemia   . Anxiety   . Breast cancer ([place])   . Chronic combined systolic and diastolic CHF (congestive heart failure) ([place])   . Cigarette nicotine dependence   . CKD (chronic kidney disease), stage III ([place])   . Colon polyps   . COPD (chronic obstructive pulmonary disease) ([place])   . Diabetes mellitus without complication ([place])   . Diabetic neuropathy ([place])   . Elevated lipase   . Gout   . Hyperlipemia   . Hypertension   . Insomnia   . Lymphedema   . Marijuana use   . Mild CAD 2016   a. NSTEMI 2016 in context of cocaine abuse, 50% RCA% at that time.  . OSA treated with BiPAP   . PAD (peripheral artery disease) ([place])    a. s/p L SFA stenting 08/2019. b. left fem-to-below-knee-popliteal bypass 09/2019.  Marland Kitchen Sleep apnea    wears BIPAP  . Ulcer of foot ([place])    right   Past Surgical History:  Procedure Laterality Date  . ABDOMINAL AORTOGRAM W/LOWER EXTREMITY N/A 09/26/2019   Procedure: ABDOMINAL AORTOGRAM W/LOWER EXTREMITY;  Surgeon: Angelia Mould, MD;  Location: [place] CV LAB;  Service: Cardiovascular;  Laterality: N/A;  . ABDOMINAL AORTOGRAM W/LOWER EXTREMITY Bilateral 12/08/2019   Procedure: ABDOMINAL AORTOGRAM W/LOWER EXTREMITY;  Surgeon: Waynetta Sandy, MD;  Location: [place] CV LAB;  Service: Cardiovascular;  Laterality: Bilateral;  . ABDOMINAL AORTOGRAM W/LOWER EXTREMITY Left 01/26/2020   Procedure: ABDOMINAL AORTOGRAM W/LOWER EXTREMITY;  Surgeon: Waynetta Sandy, MD;  Location: [place] CV LAB;  Service:  Cardiovascular;  Laterality: Left;  . ABDOMINAL HYSTERECTOMY    . [place] [facility]  . FEMORAL-POPLITEAL BYPASS GRAFT Left 10/14/2019   Procedure: BYPASS GRAFT LEFT FEMORAL-POPLITEAL ARTERY USING NONREVERSED SAPHENOUS VEIN;  Surgeon: Waynetta Sandy, MD;  Location: [place];  Service: Vascular;  Laterality: Left;  Marland Kitchen MASTECTOMY Right April 2016  . PERIPHERAL VASCULAR ATHERECTOMY Left 01/26/2020   Procedure: PERIPHERAL  VASCULAR ATHERECTOMY;  Surgeon: Waynetta Sandy, MD;  Location: [place] CV LAB;  Service: Cardiovascular;  Laterality: Left;  PT and AT - [place]  . PERIPHERAL VASCULAR BALLOON ANGIOPLASTY Left 01/26/2020   Procedure: PERIPHERAL VASCULAR BALLOON ANGIOPLASTY;  Surgeon: Waynetta Sandy, MD;  Location: [place] CV LAB;  Service: Cardiovascular;  Laterality: Left;  [facility]   . PERIPHERAL VASCULAR INTERVENTION Left 09/26/2019   Procedure: PERIPHERAL VASCULAR INTERVENTION;  Surgeon: Angelia Mould, MD;  Location: [place] CV LAB;  Service: Cardiovascular;  Laterality: Left;  superficial femoral    Social History:  reports that she has been smoking cigarettes. She has never used smokeless tobacco. She reports previous alcohol use. She reports previous drug use. Drug: Marijuana. Lives at home.  Unclear family situation.  Unclear ambulatory status.  Allergies  Allergen Reactions  . Tramadol Swelling  . Nsaids Other (See Comments)    Pancreatitis  . Tolmetin Other (See Comments)    Pancreatitis  . Aspirin Other (See Comments)    "Makes my pancreas act up"     Family History  Problem Relation Age of Onset  . Diabetes Other   . Heart disease Other   . Breast cancer Maternal Grandmother   . Breast cancer Paternal Grandmother   . Stroke Son   . Colon cancer Neg Hx   . Esophageal cancer Neg Hx   . Rectal cancer Neg Hx   . Stomach cancer Neg Hx       Prior to Admission medications   Medication Sig Start Date End Date Taking? Authorizing Provider  albuterol (PROAIR HFA) 108 (90 Base) MCG/ACT inhaler Inhale 2 puffs into the lungs every 6 (six) hours as needed for shortness of breath.  05/19/14   [provider]  allopurinol (ZYLOPRIM) 300 MG tablet Take 300 mg by mouth daily. 10/23/19   [provider]  amitriptyline (ELAVIL) 100 MG tablet Take 200 mg by mouth at bedtime.    [provider]  budesonide-formoterol (SYMBICORT) 160-4.5  MCG/ACT inhaler Inhale 2 puffs into the lungs 2 (two) times daily.    [provider]  clopidogrel (PLAVIX) 75 MG tablet Take 1 tablet (75 mg total) by mouth daily. 09/27/19 09/26/20  Lyndee Hensen, DO  dapagliflozin propanediol (FARXIGA) 10 MG TABS tablet Take 1 tablet (10 mg total) by mouth daily before breakfast. 09/26/19   Angelia Mould, MD  ezetimibe (ZETIA) 10 MG tablet Take 1 tablet (10 mg total) by mouth daily. Take with largest meal of the day 12/22/19   Fay Records, MD  gabapentin (NEURONTIN) 800 MG tablet Take 800 mg by mouth 3 (three) times daily.    [provider]  glimepiride (AMARYL) 4 MG tablet Take 8 mg by mouth daily with breakfast.     [provider]  insulin glargine (LANTUS) 100 UNIT/ML injection Inject 0.4 mLs (40 Units total) into the skin daily. Patient taking differently: Inject 80 Units into the skin daily.  09/27/19   Brimage, Ronnette Juniper, DO  insulin lispro (HUMALOG) 100 UNIT/ML injection Inject 14 Units into the  skin 3 (three) times daily before meals.     [provider]  lipase/protease/amylase (CREON) 36000 UNITS CPEP capsule Take 2 capsule by mouth with each meal and 1 capsule by mouth with each snack Patient taking differently: Take 36,000-72,000 Units by mouth See admin instructions. Take 72000 units by mouth with each meal and 36000 units by mouth with each snack 09/17/19   Armbruster, Carlota Raspberry, MD  losartan (COZAAR) 50 MG tablet Take 50 mg by mouth daily. 02/11/19   [provider]  metoprolol succinate (TOPROL-XL) 50 MG 24 hr tablet Take 1 tablet (50 mg total) by mouth daily. Take with or immediately following a meal. 12/22/19   Fay Records, MD  Multiple Vitamins-Minerals (ADULT ONE DAILY GUMMIES) CHEW Chew 1 capsule by mouth daily.    [provider]  NARCAN 4 MG/0.1ML LIQD nasal spray kit Place 1 spray into the nose as needed (opioid overdose).  11/25/19   [provider]  Oxycodone HCl 10 MG  TABS Take 10 mg by mouth in the morning, at noon, and at bedtime.  11/25/19   [provider]  potassium chloride (K-DUR) 10 MEQ tablet Take 10 mEq by mouth daily as needed (For cramps).     [provider]  rosuvastatin (CRESTOR) 40 MG tablet Take 1 tablet (40 mg total) by mouth daily. 12/22/19   Fay Records, MD  Toremifene Citrate (FARESTON) 60 MG tablet Take 1 tablet (60 mg total) by mouth daily. Patient not taking: Reported on 01/23/2020 09/27/19   Lyndee Hensen, DO  torsemide (DEMADEX) 20 MG tablet Take 2 tablets (40 mg total) by mouth daily. 09/27/19 01/23/20  Lyndee Hensen, DO    Physical Exam: BP (!) 162/105   Pulse (!) 123   Temp 100.2 F (37.9 C) (Oral)   Resp (!) 25   Ht 5' 6"  (1.676 m)   Wt 90.3 kg   SpO2 94%   BMI 32.12 kg/m   General: Drowsy, oriented x1, mild distress Eyes: Sclera nonicteric, extraocular movements are intact ENT: Normocephalic, atraumatic, mucous membranes are slightly dry Neck: Supple, mild JVD Cardiovascular: Regular rhythm, borderline tachycardia Respiratory: Labored breathing, decreased breath sounds bibasilar Abdomen: Soft, nontender, nondistended, hypoactive bowel sounds Skin: Patient has a unstageable tunneled ulcer approximately 1.5 cm in diameter circumferential on the dorsal aspect of her left foot above her first toe.  Her foot itself is cool and the ulcer appears to be dry and nondraining. Musculoskeletal: Patient's left lower extremity above the foot below the knee is warm and mildly erythematous.  This contrasts with her right leg which is more cool and does not show any erythema.  This seems to be consistent with a cellulitis Psychiatric: Drowsy, but patient is appropriate Neurologic: Limited due to patient's somnolence.          Labs on Admission:  Basic Metabolic Panel: Recent Labs  Lab 02/23/20 0952  NA 133*  K 3.7  CL 97*  CO2 26  GLUCOSE 439*  BUN 16  CREATININE 1.17*  CALCIUM 8.3*   Liver Function  Tests: Recent Labs  Lab 02/23/20 0952  AST 18  ALT 18  ALKPHOS 158*  BILITOT 0.6  PROT 7.3  ALBUMIN 2.6*   No results for input(s): LIPASE, AMYLASE in the last 168 hours. No results for input(s): AMMONIA in the last 168 hours. CBC: Recent Labs  Lab 02/23/20 0952  WBC 11.4*  NEUTROABS 9.3*  HGB 12.4  HCT 40.4  MCV 82.1  PLT 419*  Cardiac Enzymes: No results for input(s): CKTOTAL, CKMB, CKMBINDEX, TROPONINI in the last 168 hours.  BNP (last 3 results) Recent Labs    09/21/19 1449 10/09/19 1715 02/23/20 0952  BNP 733.9* 488.5* 868.5*    ProBNP (last 3 results) Recent Labs    12/19/19 1546 02/09/20 0825  PROBNP 1,148* 1,185*    CBG: Recent Labs  Lab 02/23/20 0930 02/23/20 1500  GLUCAP 406* 267*    Radiological Exams on Admission: DG Chest Port 1 View  Result Date: 02/23/2020 CLINICAL DATA:  Concern for sepsis EXAM: PORTABLE CHEST 1 VIEW COMPARISON:  10/09/2019 FINDINGS: Increased bibasilar airspace opacities worse on the left obscuring the left hemidiaphragm concerning for bibasilar pneumonia. Stable cardiomegaly. No CHF pattern or large effusion. No pneumothorax. Aorta atherosclerotic. IMPRESSION: Bibasilar airspace process, worse on the left concerning for pneumonia. Electronically Signed   By: Jerilynn Mages.  Shick M.D.   On: 02/23/2020 10:24   DG Foot 2 Views Left  Result Date: 02/23/2020 CLINICAL DATA:  Chronic wound EXAM: LEFT FOOT - 2 VIEW COMPARISON:  Sep 21, 2019 FINDINGS: Frontal and lateral views were obtained. There is soft tissue swelling. There is suggestion of soft tissue air medial to the distal aspect of the first metatarsal. There is no erosive change or osteomyelitis. No fracture or dislocation. Joint spaces overall appear unremarkable. There are prominent posterior and inferior calcaneal spurs. IMPRESSION: Soft tissue swelling with soft tissue air medial to the mid to distal first metatarsal. No bony destruction or erosion. No appreciable joint space  narrowing. There are calcaneal spurs. No fracture or dislocation evident. Electronically Signed   By: Lowella Grip III M.D.   On: 02/23/2020 10:22    EKG: Independently reviewed.  Right axis deviation, sinus tachycardia, prolonged QT 399  Assessment/Plan Present on Admission: . Septic shock ([place]): Suspect underlying cause is less lower extremity cellulitis.  She has a small tunneled ulcer which looks dry on the dorsal aspect of her left foot but it is not very tender in the surrounding area is cool to touch.  Subsequently the possible pneumonia seen on chest x-ray notes a normal procalcitonin.  Continue aggressive fluid resuscitation and IV antibiotics broad-spectrum.  No evidence of UTI  . Acute on chronic systolic CHF (congestive heart failure) ([place]): Noted elevated BNP of almost 900.  Patient had an echocardiogram in June of this year noting global hypokinesis and an ejection fraction of 40 to 45%.  We likely will make her heart failure acutely worse in the short-term with aggressive volume resuscitation.  Uncontrolled diabetes mellitus with peripheral vascular complication on long-term insulin: Previous A1c's note very poor control.  She is normal at 80 of Lantus.  Initially had plans for Endo tool, but CBGs have since improved.  For now placed on every 4 hours sliding scale and 40 of Lantus.  . Obesity (BMI 30-39.9) patient meets criteria BMI greater than 30.  Marland Kitchen History of peripheral vascular disease/ischemia of left lower extremity: Status post previous stent and balloon angioplasty.  Transferring to [facility] so that vascular surgery can evaluate.  Marland Kitchen COPD (chronic obstructive pulmonary disease) (HCC)  Principal Problem:   Septic shock (HCC) Active Problems:   Uncontrolled diabetes mellitus with circulatory complication, with long-term current use of insulin (HCC)   COPD (chronic obstructive pulmonary disease) (HCC)   PVD (peripheral vascular disease) (HCC)   Ischemia of left  lower extremity   Chronic systolic CHF (congestive heart failure) (HCC)   Obesity (BMI 30-39.9)   DVT prophylaxis: SCDs  Code Status: Presumed full code, patient is slightly confused and not able to converse with me  Family Communication: Left message for family  Disposition Plan: Stepdown and transferred to [facility].  If follow-up lactic acid level is still markedly elevated, will discuss with critical care about transfer to ICU  Consults called: Dr. Oneida Alar, vascular surgery aware.  Notify after arrival to [facility].  Admission status: Given need for acute hospital services past 2 midnights, admit as inpatient  I have spent 65 minutes in the care of this critically ill patient including bedside examination, interpretation of labs and radiologic studies, discussion with consultants and medical decision making.  Annita Brod MD [facility] Pager [phone]  If 7PM-7AM, please contact night-coverage [email or web address] Password [facility]  02/23/2020, 4:25 PM

## 2020-02-23 NOTE — Progress Notes (Signed)
Notified bedside nurse of need to draw repeat lactic acid. 

## 2020-02-24 ENCOUNTER — Inpatient Hospital Stay (HOSPITAL_COMMUNITY): Payer: Medicare Other

## 2020-02-24 ENCOUNTER — Telehealth: Payer: Self-pay | Admitting: Emergency Medicine

## 2020-02-24 DIAGNOSIS — I998 Other disorder of circulatory system: Secondary | ICD-10-CM | POA: Diagnosis not present

## 2020-02-24 DIAGNOSIS — E669 Obesity, unspecified: Secondary | ICD-10-CM | POA: Diagnosis not present

## 2020-02-24 DIAGNOSIS — I739 Peripheral vascular disease, unspecified: Secondary | ICD-10-CM | POA: Diagnosis not present

## 2020-02-24 DIAGNOSIS — A419 Sepsis, unspecified organism: Secondary | ICD-10-CM | POA: Diagnosis not present

## 2020-02-24 DIAGNOSIS — R6520 Severe sepsis without septic shock: Secondary | ICD-10-CM | POA: Diagnosis not present

## 2020-02-24 DIAGNOSIS — L03116 Cellulitis of left lower limb: Secondary | ICD-10-CM

## 2020-02-24 LAB — BLOOD CULTURE ID PANEL (REFLEXED) - BCID2

## 2020-02-24 LAB — URINE CULTURE: Culture: <10000 — AB

## 2020-02-24 LAB — CBC
HCT: 32.7 % — ABNORMAL LOW (ref 36.0–46.0)
Hemoglobin: 9.9 g/dL — ABNORMAL LOW (ref 12.0–15.0)
MCH: 24.9 pg — ABNORMAL LOW (ref 26.0–34.0)
MCHC: 30.3 g/dL (ref 30.0–36.0)
MCV: 82.4 fL (ref 80.0–100.0)
Platelets: 313 10*3/uL (ref 150–400)
RBC: 3.97 MIL/uL (ref 3.87–5.11)
RDW: 18.4 % — ABNORMAL HIGH (ref 11.5–15.5)
WBC: 11.3 10*3/uL — ABNORMAL HIGH (ref 4.0–10.5)
nRBC: 0.3 % — ABNORMAL HIGH (ref 0.0–0.2)

## 2020-02-24 LAB — BASIC METABOLIC PANEL
Anion gap: 8 (ref 5–15)
BUN: 14 mg/dL (ref 6–20)
CO2: 24 mmol/L (ref 22–32)
Calcium: 7.4 mg/dL — ABNORMAL LOW (ref 8.9–10.3)
Chloride: 104 mmol/L (ref 98–111)
Creatinine, Ser: 1.38 mg/dL — ABNORMAL HIGH (ref 0.44–1.00)
GFR, Estimated: 44 mL/min — ABNORMAL LOW (ref >60)
Glucose, Bld: 117 mg/dL — ABNORMAL HIGH (ref 70–99)
Potassium: 3.2 mmol/L — ABNORMAL LOW (ref 3.5–5.1)
Sodium: 136 mmol/L (ref 135–145)

## 2020-02-24 LAB — LACTIC ACID, PLASMA: Lactic Acid, Venous: 2.1 mmol/L (ref 0.5–1.9)

## 2020-02-24 LAB — GLUCOSE, CAPILLARY
Glucose-Capillary: 109 mg/dL — ABNORMAL HIGH (ref 70–99)
Glucose-Capillary: 87 mg/dL (ref 70–99)
Glucose-Capillary: 195 mg/dL — ABNORMAL HIGH (ref 70–99)

## 2020-02-24 LAB — HEMOGLOBIN A1C
Hgb A1c MFr Bld: 15.1 % — ABNORMAL HIGH (ref 4.8–5.6)
Mean Plasma Glucose: 387 mg/dL

## 2020-02-24 LAB — PROTIME-INR
INR: 1.4 — ABNORMAL HIGH (ref 0.8–1.2)
Prothrombin Time: 16.8 seconds — ABNORMAL HIGH (ref 11.4–15.2)

## 2020-02-24 LAB — PROCALCITONIN: Procalcitonin: 0.67 ng/mL

## 2020-02-24 LAB — CBG MONITORING, ED: Glucose-Capillary: 87 mg/dL (ref 70–99)

## 2020-02-24 LAB — CORTISOL-AM, BLOOD: Cortisol - AM: 25.5 ug/dL — ABNORMAL HIGH (ref 6.7–22.6)

## 2020-02-24 MED ORDER — INSULIN ASPART 100 UNIT/ML ~~LOC~~ SOLN
0.0000 [IU] | Freq: Three times a day (TID) | SUBCUTANEOUS | Status: DC
  Administered 2020-02-24: 3 [IU] via SUBCUTANEOUS
  Administered 2020-02-25: 2 [IU] via SUBCUTANEOUS
  Administered 2020-02-26: 5 [IU] via SUBCUTANEOUS
  Administered 2020-02-26 – 2020-02-27 (×2): 2 [IU] via SUBCUTANEOUS
  Administered 2020-02-27: 3 [IU] via SUBCUTANEOUS
  Administered 2020-02-27: 2 [IU] via SUBCUTANEOUS

## 2020-02-24 MED ORDER — LACTATED RINGERS IV BOLUS
1000.0000 mL | Freq: Once | INTRAVENOUS | Status: AC
  Administered 2020-02-24: 1000 mL via INTRAVENOUS

## 2020-02-24 MED ORDER — SODIUM CHLORIDE 0.9 % IV SOLN
2.0000 g | Freq: Three times a day (TID) | INTRAVENOUS | Status: DC
  Administered 2020-02-24: 2 g via INTRAVENOUS
  Filled 2020-02-24: qty 2

## 2020-02-24 MED ORDER — INSULIN ASPART 100 UNIT/ML ~~LOC~~ SOLN
0.0000 [IU] | Freq: Every day | SUBCUTANEOUS | Status: DC
  Administered 2020-02-26: 2 [IU] via SUBCUTANEOUS

## 2020-02-24 MED ORDER — INSULIN GLARGINE 100 UNIT/ML ~~LOC~~ SOLN
45.0000 [IU] | Freq: Every day | SUBCUTANEOUS | Status: DC
  Administered 2020-02-24: 45 [IU] via SUBCUTANEOUS
  Filled 2020-02-24 (×2): qty 0.45

## 2020-02-24 MED ORDER — POTASSIUM CHLORIDE CRYS ER 20 MEQ PO TBCR
40.0000 meq | EXTENDED_RELEASE_TABLET | Freq: Once | ORAL | Status: AC
  Administered 2020-02-24: 40 meq via ORAL
  Filled 2020-02-24: qty 2

## 2020-02-24 MED ORDER — LACTATED RINGERS IV SOLN: INTRAVENOUS | Status: DC

## 2020-02-24 MED ORDER — SODIUM CHLORIDE 0.9 % IV SOLN
2.0000 g | Freq: Two times a day (BID) | INTRAVENOUS | Status: DC
  Administered 2020-02-24 – 2020-02-27 (×6): 2 g via INTRAVENOUS
  Filled 2020-02-24 (×7): qty 2

## 2020-02-24 MED ORDER — IOHEXOL 350 MG/ML SOLN
100.0000 mL | Freq: Once | INTRAVENOUS | Status: AC | PRN
  Administered 2020-02-24: 100 mL via INTRAVENOUS

## 2020-02-24 NOTE — Progress Notes (Addendum)
Patient's BP 99/71, attending made aware.  1515: 1000cc LR bolus ordered

## 2020-02-24 NOTE — ED Notes (Signed)
Report called to 2W. Patient going to room 11. [facility] called for transport. Patient daughter [name] called to update on patient transport.

## 2020-02-24 NOTE — Progress Notes (Signed)
Inpatient Diabetes Program Recommendations  AACE/ADA: New Consensus Statement on Inpatient Glycemic Control (2015)  Target Ranges:  Prepandial:   less than 140 mg/dL      Peak postprandial:   less than 180 mg/dL (1-2 hours)      Critically ill patients:  140 - 180 mg/dL   Lab Results  Component Value Date   GLUCAP 87 02/24/2020   HGBA1C 15.1 (H) 02/23/2020    Review of Glycemic Control Results for Morgan Beasley, Morgan Beasley (MRN [id number]) as of 02/24/2020 09:41  Ref. Range 02/23/2020 19:46 02/23/2020 22:00 02/24/2020 07:47  Glucose-Capillary Latest Ref Range: 70 - 99 mg/dL 172 (H) 181 (H) 87  Diabetes history: DM2 Home DM Meds: Lantus 80 units Daily                              Humalog 14 units TID                              Amaryl 8 mg Daily                              Farxiga 10 mg Daily Current orders for Inpatient glycemic control: Lantus 50 units qd + Novolog resistant correction scale tid + hs 0-5 units  Inpatient Diabetes Program Recommendations:   Fasting CBG 87 -Decrease Lantus to 45 units qd -Decrease Novolog correction to moderate tid + hs 0-5 units Secure chat sent to Dr. Gevena Barre.  Thank you, Nani Gasser. Yasmyn Bellisario, RN, MSN, CDE  Diabetes Coordinator Inpatient Glycemic Control Team Team Pager [phone] (8am-5pm) 02/24/2020 9:45 AM

## 2020-02-24 NOTE — Progress Notes (Addendum)
Follow-up note:  Patient seen in follow-up.  I admitted the patient last night and patient was seen this morning by my hospitalist colleague, Dr. Algis Liming.  Transferred to [facility] progressive unit by late morning.  No further fevers, appreciate vascular surgery follow-up.  Lactic acidosis improved and then rose again.  Despite attempts to order repeat lactic acid levels, this has been hampered due to patient's change in location areas.  Mildly hypotensive by this afternoon and has received multiple lactated Ringer fluid boluses.  Currently waiting for follow-up lactic acid level.  Positive blood cultures for Serratia, consistent with cellulitis/lower extremity wound.  Appreciate vascular surgery evaluation.  Aortogram notes now complete occlusion of the mid to distal right superficial femoral artery and now it notes single-vessel runoff to the right foot via the anterior tibial artery.  Will await lactic acid level findings.  If patient spikes fever, agree with restarting vancomycin.  I have spent 45 minutes throughout the day in the care of this critically ill patient including medical decision making, bedside examination, interpretation of labs and discussion with consultants.

## 2020-02-24 NOTE — Consult Note (Signed)
Referring Physician: [facility]  Patient name: Morgan Beasley MRN: [id number] DOB: 10/16/1960 Sex: female  REASON FOR CONSULT: left leg pain  HPI: Morgan Beasley is a 59 y.o. female, known to our service. Left leg pain from thigh to left foot for several days and went to [facility] ER.  She was thought to be septic with left leg as source.  WBC was 11 and glucose was over 400.  BNP also elevated at 868. She is poorly controlled diabetic with best A1C 11 in the last year.  Was 41 yesterday. She was given IV antibiotics yesterday but states leg still hurts. Prior left fem BK pop bypass with vein Dr Donzetta Matters June 2021 after a failed SFA stent.  She subsequently had laser atherectomy of her PT 9/27.  Her best runoff vessel at that time was the PT. She has had a chronic wound on the dorsum of the left foot since June. Pt states it hasn't really changed. She is still smoking but trying to quit. She is on cefepime and flagyl.    Past Medical History:  Diagnosis Date  . Alcohol dependence ([place])   . Anemia   . Anxiety   . Breast cancer ([place])   . Chronic combined systolic and diastolic CHF (congestive heart failure) ([place])   . Cigarette nicotine dependence   . CKD (chronic kidney disease), stage III ([place])   . Colon polyps   . COPD (chronic obstructive pulmonary disease) ([place])   . Diabetes mellitus without complication ([place])   . Diabetic neuropathy ([place])   . Elevated lipase   . Gout   . Hyperlipemia   . Hypertension   . Insomnia   . Lymphedema   . Marijuana use   . Mild CAD 2016   a. NSTEMI 2016 in context of cocaine abuse, 50% RCA% at that time.  . OSA treated with BiPAP   . PAD (peripheral artery disease) ([place])    a. s/p L SFA stenting 08/2019. b. left fem-to-below-knee-popliteal bypass 09/2019.  Marland Kitchen Sleep apnea    wears BIPAP  . Ulcer of foot ([place])    right   Past Surgical History:  Procedure Laterality Date  . ABDOMINAL AORTOGRAM W/LOWER EXTREMITY N/A 09/26/2019   Procedure:  ABDOMINAL AORTOGRAM W/LOWER EXTREMITY;  Surgeon: Angelia Mould, MD;  Location: [place] [facility];  Service: Cardiovascular;  Laterality: N/A;  . ABDOMINAL AORTOGRAM W/LOWER EXTREMITY Bilateral 12/08/2019   Procedure: ABDOMINAL AORTOGRAM W/LOWER EXTREMITY;  Surgeon: Waynetta Sandy, MD;  Location: [place] [facility];  Service: Cardiovascular;  Laterality: Bilateral;  . ABDOMINAL AORTOGRAM W/LOWER EXTREMITY Left 01/26/2020   Procedure: ABDOMINAL AORTOGRAM W/LOWER EXTREMITY;  Surgeon: Waynetta Sandy, MD;  Location: [place] [facility];  Service: Cardiovascular;  Laterality: Left;  . ABDOMINAL HYSTERECTOMY    . [facility]  . FEMORAL-POPLITEAL BYPASS GRAFT Left 10/14/2019   Procedure: BYPASS GRAFT LEFT FEMORAL-POPLITEAL ARTERY USING NONREVERSED SAPHENOUS VEIN;  Surgeon: Waynetta Sandy, MD;  Location: [place];  Service: Vascular;  Laterality: Left;  Marland Kitchen MASTECTOMY Right April 2016  . PERIPHERAL VASCULAR ATHERECTOMY Left 01/26/2020   Procedure: PERIPHERAL VASCULAR ATHERECTOMY;  Surgeon: Waynetta Sandy, MD;  Location: [place] [facility];  Service: Cardiovascular;  Laterality: Left;  PT and AT - Laser  . PERIPHERAL VASCULAR BALLOON ANGIOPLASTY Left 01/26/2020   Procedure: PERIPHERAL VASCULAR BALLOON ANGIOPLASTY;  Surgeon: Waynetta Sandy, MD;  Location: [place] [facility];  Service: Cardiovascular;  Laterality:  Left;  [facility]   . PERIPHERAL VASCULAR INTERVENTION Left 09/26/2019   Procedure: PERIPHERAL VASCULAR INTERVENTION;  Surgeon: Angelia Mould, MD;  Location: [place] [facility];  Service: Cardiovascular;  Laterality: Left;  superficial femoral    Family History  Problem Relation Age of Onset  . Diabetes Other   . Heart disease Other   . Breast cancer Maternal Grandmother   . Breast cancer Paternal Grandmother   . Stroke Son   . Colon cancer Neg Hx   . Esophageal cancer Neg Hx   . Rectal cancer Neg Hx   .  Stomach cancer Neg Hx     SOCIAL HISTORY: Social History   Socioeconomic History  . Marital status: Single    Spouse name: Not on file  . Number of children: Not on file  . Years of education: Not on file  . Highest education level: Not on file  Occupational History  . Not on file  Tobacco Use  . Smoking status: Light Tobacco Smoker    Types: Cigarettes    Last attempt to quit: 06/04/2019    Years since quitting: 0.7  . Smokeless tobacco: Never Used  Vaping Use  . Vaping Use: Never used  Substance and Sexual Activity  . Alcohol use: Not Currently    Comment: Beer - "on the weekends hanging out, maybe 2 or 3"   . Drug use: Not Currently    Types: Marijuana    Comment: last used 8 2020  . Sexual activity: Not on file  Other Topics Concern  . Not on file  Social History Narrative  . Not on file   Social Determinants of Health   Financial Resource Strain:   . Difficulty of Paying Living Expenses: Not on file  Food Insecurity:   . Worried About Charity fundraiser in the Last Year: Not on file  . Ran Out of Food in the Last Year: Not on file  Transportation Needs:   . Lack of Transportation (Medical): Not on file  . Lack of Transportation (Non-Medical): Not on file  Physical Activity:   . Days of Exercise per Week: Not on file  . Minutes of Exercise per Session: Not on file  Stress:   . Feeling of Stress : Not on file  Social Connections:   . Frequency of Communication with Friends and Family: Not on file  . Frequency of Social Gatherings with Friends and Family: Not on file  . Attends Religious Services: Not on file  . Active Member of Clubs or Organizations: Not on file  . Attends Archivist Meetings: Not on file  . Marital Status: Not on file  Intimate Partner Violence:   . Fear of Current or Ex-Partner: Not on file  . Emotionally Abused: Not on file  . Physically Abused: Not on file  . Sexually Abused: Not on file    Allergies  Allergen Reactions   . Tramadol Swelling  . Nsaids Other (See Comments)    Pancreatitis  . Tolmetin Other (See Comments)    Pancreatitis  . Tylenol [Acetaminophen] Other (See Comments)    unknown  . Aspirin Other (See Comments)    "Makes my pancreas act up"     Current Facility-Administered Medications  Medication Dose Route Frequency Provider Last Rate Last Admin  . acetaminophen (TYLENOL) tablet 650 mg  650 mg Oral Q6H PRN Annita Brod, MD       Or  . acetaminophen (TYLENOL) suppository 650 mg  650 mg Rectal Q6H PRN Annita Brod, MD      . amitriptyline (ELAVIL) tablet 200 mg  200 mg Oral QHS Annita Brod, MD   200 mg at 02/23/20 2234  . ceFEPIme (MAXIPIME) 2 g in sodium chloride 0.9 % 100 mL IVPB  2 g Intravenous Q12H Adrian Saran, [facility]      . dextrose 50 % solution 0-50 mL  0-50 mL Intravenous PRN Annita Brod, MD      . ezetimibe (ZETIA) tablet 10 mg  10 mg Oral QPC supper Annita Brod, MD      . gabapentin (NEURONTIN) capsule 800 mg  800 mg Oral TID Annita Brod, MD   800 mg at 02/24/20 0921  . insulin aspart (novoLOG) injection 0-20 Units  0-20 Units Subcutaneous TID WC Annita Brod, MD   4 Units at 02/23/20 1951  . insulin aspart (novoLOG) injection 0-5 Units  0-5 Units Subcutaneous QHS Gevena Barre K, MD      . insulin glargine (LANTUS) injection 50 Units  50 Units Subcutaneous QHS Annita Brod, MD   50 Units at 02/23/20 2234  . lactated ringers infusion   Intravenous Continuous Annita Brod, MD   Paused at 02/24/20 [id number]  . lipase/protease/amylase (CREON) capsule 36,000 Units  36,000 Units Oral BID BM PRN Annita Brod, MD      . lipase/protease/amylase (CREON) capsule 72,000 Units  72,000 Units Oral TID WC Annita Brod, MD   72,000 Units at 02/24/20 0748  . metroNIDAZOLE (FLAGYL) IVPB 500 mg  500 mg Intravenous Q8H Wyvonnia Dusky, MD 100 mL/hr at 02/24/20 0946 500 mg at 02/24/20 0946  . ondansetron (ZOFRAN) tablet 4 mg  4 mg  Oral Q6H PRN Annita Brod, MD       Or  . ondansetron [facility]) injection 4 mg  4 mg Intravenous Q6H PRN Annita Brod, MD      . oxyCODONE (OXYCONTIN) 12 hr tablet 10 mg  10 mg Oral TID Annita Brod, MD   10 mg at 02/24/20 0920  . polyethylene glycol (MIRALAX / GLYCOLAX) packet 17 g  17 g Oral Daily PRN Annita Brod, MD      . rosuvastatin (CRESTOR) tablet 40 mg  40 mg Oral Daily Gevena Barre K, MD      . sodium chloride flush (NS) 0.9 % injection 3 mL  3 mL Intravenous Q12H Annita Brod, MD   3 mL at 02/24/20 4097   Current Outpatient Medications  Medication Sig Dispense Refill  . albuterol (PROAIR HFA) 108 (90 Base) MCG/ACT inhaler Inhale 2 puffs into the lungs every 6 (six) hours as needed for wheezing or shortness of breath.     Marland Kitchen amitriptyline (ELAVIL) 100 MG tablet Take 100 mg by mouth at bedtime.     . clopidogrel (PLAVIX) 75 MG tablet Take 1 tablet (75 mg total) by mouth daily. 30 tablet 11  . furosemide (LASIX) 40 MG tablet Take 40-80 mg by mouth daily.    Marland Kitchen gabapentin (NEURONTIN) 800 MG tablet Take 800 mg by mouth 3 (three) times daily.    . lipase/protease/amylase (CREON) 36000 UNITS CPEP capsule Take 2 capsule by mouth with each meal and 1 capsule by mouth with each snack (Patient taking differently: Take 36,000-72,000 Units by mouth See admin instructions. Take 72000 units by mouth with each meal and 36000 units by mouth with each snack) 240 capsule 3  . metoprolol succinate (TOPROL-XL)  50 MG 24 hr tablet Take 1 tablet (50 mg total) by mouth daily. Take with or immediately following a meal. (Patient taking differently: Take 50 mg by mouth in the morning and at bedtime. ) 90 tablet 3  . budesonide-formoterol (SYMBICORT) 160-4.5 MCG/ACT inhaler Inhale 2 puffs into the lungs 2 (two) times daily.    . dapagliflozin propanediol (FARXIGA) 10 MG TABS tablet Take 1 tablet (10 mg total) by mouth daily before breakfast. 30 tablet 2  . ezetimibe (ZETIA) 10 MG  tablet Take 1 tablet (10 mg total) by mouth daily. Take with largest meal of the day 90 tablet 3  . glimepiride (AMARYL) 4 MG tablet Take 8 mg by mouth daily with breakfast.     . insulin glargine (LANTUS) 100 UNIT/ML injection Inject 0.4 mLs (40 Units total) into the skin daily. (Patient taking differently: Inject 80 Units into the skin daily. ) 10 mL 11  . insulin lispro (HUMALOG) 100 UNIT/ML injection Inject 14 Units into the skin 3 (three) times daily before meals.     Marland Kitchen losartan (COZAAR) 50 MG tablet Take 50 mg by mouth daily.    . Multiple Vitamins-Minerals (ADULT ONE DAILY GUMMIES) CHEW Chew 1 capsule by mouth daily.    Marland Kitchen NARCAN 4 MG/0.1ML LIQD nasal spray kit Place 1 spray into the nose as needed (opioid overdose).     . Oxycodone HCl 10 MG TABS Take 10 mg by mouth in the morning, at noon, and at bedtime.     . potassium chloride (K-DUR) 10 MEQ tablet Take 10 mEq by mouth daily as needed (For cramps).     . rosuvastatin (CRESTOR) 40 MG tablet Take 1 tablet (40 mg total) by mouth daily. 90 tablet 3  . Toremifene Citrate (FARESTON) 60 MG tablet Take 1 tablet (60 mg total) by mouth daily. (Patient not taking: Reported on 01/23/2020) 30 tablet 0  . torsemide (DEMADEX) 20 MG tablet Take 2 tablets (40 mg total) by mouth daily. 60 tablet 0    ROS:   General:  No weight loss, Fever, chills  HEENT: No recent headaches, no nasal bleeding, no visual changes, no sore throat  Neurologic: No dizziness, blackouts, seizures. No recent symptoms of stroke or mini- stroke. No recent episodes of slurred speech, or temporary blindness.  Cardiac: No recent episodes of chest pain/pressure, no shortness of breath at rest.  + shortness of breath with exertion.  Denies history of atrial fibrillation or irregular heartbeat  Vascular: No history of rest pain in feet.  No history of claudication.  + history of non-healing ulcer, No history of DVT   Pulmonary: No home oxygen, no productive cough, no hemoptysis,   No asthma or wheezing  Musculoskeletal:  [ ]  Arthritis, [ ]  Low back pain,  [ ]  Joint pain  Hematologic:No history of hypercoagulable state.  No history of easy bleeding.  No history of anemia  Gastrointestinal: No hematochezia or melena,  No gastroesophageal reflux, no trouble swallowing  Urinary: [ ]  chronic Kidney disease, [ ]  on HD - [ ]  MWF or [ ]  TTHS, [ ]  Burning with urination, [ ]  Frequent urination, [ ]  Difficulty urinating;   Skin: No rashes  Psychological: No history of anxiety,  No history of depression   Physical Examination  Vitals:   02/24/20 0830 02/24/20 0900 02/24/20 0930 02/24/20 1000  BP: 97/68  117/78 115/79  Pulse: 95 100 (!) 102 100  Resp: 18 18 18 18   Temp:  TempSrc:      SpO2: 97% 100% 99% 99%  Weight:      Height:        Body mass index is 32.12 kg/m.  General:  Alert and oriented, no acute distress, lethargic HEENT: Normal Neck: No JVD Pulmonary: Clear to auscultation bilaterally Cardiac: Regular Rate and Rhythm Abdomen: Soft, non-tender, non-distended, obese no mass Skin: No rash     Extremity Pulses:  2+ radial, brachial, femoral, absent dorsalis pedis, posterior tibial pulses bilaterally Musculoskeletal: No deformity diffuse left leg edema from knee to foot, mild erythema entire leg tender to palpation  Neurologic: Upper and lower extremity motor 5/5 and symmetric  DATA:  As per HPI  Chest xray ?pneumonia but poor inspiration  ASSESSMENT:  Cellulitis left leg no prosthetic in the leg except old stent.  Leg and foot clearly not healing despite multiple revasc procedures   PLAN:  Transfer to [facility] Continue antibiotics Will get CTA with runoff later today after arrives at [facility] Dr Trula Slade on call today if questions I will see her again tomorrow   Ruta Hinds, MD [facility] Office: [phone]

## 2020-02-24 NOTE — Progress Notes (Addendum)
PROGRESS NOTE   Morgan Beasley  OIN:[id number]    DOB: June 14, 1960    DOA: 02/23/2020  PCP: Riki Sheer, NP   I have briefly reviewed patients previous medical records in [facility].  Chief Complaint  Patient presents with  . Wound Infection    Brief Narrative:  59 year old female, lives alone but her daughter checks on her regularly, ambulates with the help of a walker with seat, PMH of chronic combined systolic and diastolic CHF, ongoing tobacco abuse, stage II CKD, COPD not on home oxygen, OSA on nightly BiPAP, poorly controlled DM2 with peripheral neuropathy, gout, HLD, HTN, prior substance abuse (THC, cocaine), PAD s/p L SFA stenting 08/2019 and left femoral to below-knee popliteal bypass 09/2019 with nonhealing left foot ulcer, mild CAD presented initially to the [facility] ED on 10/25 due to leg and foot pain.  Admitted for sepsis/Serratia bacteremia, left lower extremity cellulitis complicating chronic nonhealing ulcer, persistent lactic acidosis and concern for critical left lower extremity ischemia.  Vascular surgery consulted.  Patient transferred over to [facility] for further evaluation and management.   Assessment & Plan:  Principal Problem:   Septic shock ([place]) Active Problems:   Uncontrolled diabetes mellitus with circulatory complication, with long-term current use of insulin (HCC)   COPD (chronic obstructive pulmonary disease) (HCC)   PVD (peripheral vascular disease) (HCC)   Ischemia of left lower extremity   Chronic systolic CHF (congestive heart failure) (HCC)   Obesity (BMI 30-39.9)   Septic shock (based on persistently elevated lactate) due to Serratia bacteremia, possibly due to left lower extremity cellulitis complicating chronic nonhealing ulcer in a diabetic, POA: On initial arrival, temperature 100.2 F, transient tachypnea, tachycardia in the 110s-120s, hypertensive with SBP 158/100 and not hypoxic on room air.  RSV/flu  panel/COVID-19 PCR negative.  Chest x-ray reported as bibasilar airspace process, worsening on left concerning for pneumonia although low clinical index of suspicion for pneumonia and procalcitonin negative.  X-ray of left foot: Soft tissue swelling with soft tissue air medial to the mid to distal first metatarsal.  No bony destruction or erosion.  Urine culture: Insignificant growth.  BC ID: Serratia Marcescens.  Treated with IV fluids, started empirically on IV ceftriaxone, vancomycin (s/p single dose) and metronidazole.  Based on BCID, antibiotics changed to cefepime and metronidazole.  If spikes fever again, may consider adding back vancomycin (discussed with [facility] pharmacist).  Concern for critical left lower extremity ischemia, see discussion below.  Lactic acidosis Lactate >greater than 11 >3.6 >greater than 11.  Has received aggressive IV fluid resuscitation since ED arrival but lactate was still high.  Will repeat lactate and trend.  Continue IV fluids.  High concern for critical left lower extremity ischemia and vascular surgery consulted.  PAD, s/p prior LLE interventions, nonhealing foot ulcer, concern for critical limb ischemia Discussed with Dr. Oneida Alar, vascular surgery.  Consultation appreciated and planning CTA with runoff to further evaluate.   Acute metabolic encephalopathy: Reportedly poor historian on arrival.?  Related to sepsis versus pain meds.  Seems to have resolved.  Monitor.  Chronic combined CHF BNP 868.5.  TTE 10/13/2019: LVEF 40-45% with LV global hypokinesis.  Despite the elevated BNP, clinically did not appear in overt CHF, in fact even appeared slightly on the dry side this morning.  Having said that, need to monitor closely for developing CHF while on IV fluid resuscitation.  If PAD interventions planned, recommend cardiology consultation for preop clearance.  Acute kidney injury complicating stage  II chronic kidney disease: Creatinine 1.09 on 10/11, presented with  creatinine of 1.17 which is increased to 1.38.  Likely due to sepsis.  Has not received contrast thus far.  Continue IV fluids.  Minimize nephrotoxic medications/measures as possible.  Follow BMP in a.m.  Hypokalemia: Replace and follow.  Acute anemia: Baseline hemoglobin not clear but most recent hemoglobin was in the 12-13 range.  Hemoglobin dropped from 12.4 on admission to 9.9 in the absence of overt bleeding.  May be due to acute infection and hemodilution.  Follow CBC in a.m. and transfuse if hemoglobin 7 g or less.  Poorly controlled DM2 with peripheral neuropathy A1c 15.1 on 10/25.  Continue current reduced dose of Lantus and SSI.  Monitor closely and adjust insulins as needed.  Continue gabapentin  Essential hypertension: Controlled.  Hyperlipidemia: Continue statins and Zetia.  COPD/tobacco abuse: Reported smoking up to a few days PTA.  Not on home oxygen.  No clinical bronchospasm.  OSA on nightly BiPAP Continue.  Body mass index is 32.12 kg/m./Obesity   DVT prophylaxis: SCDs Start: 02/23/20 1430     Code Status: Full Code Family Communication: None at bedside Disposition:  Status is: Inpatient  Remains inpatient appropriate because:Inpatient level of care appropriate due to severity of illness   Dispo: The patient is from: Home              Anticipated d/c is to: Home              Anticipated d/c date is: > 3 days              Patient currently is not medically stable to d/c.        Consultants:   Vascular surgery  Procedures:   None  Antimicrobials:    Anti-infectives (From admission, onward)   Start     Dose/Rate Route Frequency Ordered Stop   02/24/20 1800  ceFEPIme (MAXIPIME) 2 g in sodium chloride 0.9 % 100 mL IVPB        2 g 200 mL/hr over 30 Minutes Intravenous Every 12 hours 02/24/20 0732     02/24/20 0600  ceFEPIme (MAXIPIME) 2 g in sodium chloride 0.9 % 100 mL IVPB  Status:  Discontinued        2 g 200 mL/hr over 30 Minutes  Intravenous Every 8 hours 02/24/20 0440 02/24/20 0732   02/23/20 1015  vancomycin (VANCOREADY) IVPB 1750 mg/350 mL        1,750 mg 175 mL/hr over 120 Minutes Intravenous  Once 02/23/20 1007 02/23/20 1347   02/23/20 1000  cefTRIAXone (ROCEPHIN) 2 g in sodium chloride 0.9 % 100 mL IVPB  Status:  Discontinued        2 g 200 mL/hr over 30 Minutes Intravenous Every 24 hours 02/23/20 0952 02/24/20 0440   02/23/20 1000  metroNIDAZOLE (FLAGYL) IVPB 500 mg        500 mg 100 mL/hr over 60 Minutes Intravenous Every 8 hours 02/23/20 8850          Subjective:  Patient seen this morning at the [facility] ED prior to transfer.  Ongoing left lower extremity pain.  Denies dyspnea, cough, chest pain, palpitations, dizziness or lightheadedness.  Objective:   Vitals:   02/24/20 0900 02/24/20 0930 02/24/20 1000 02/24/20 1030  BP:  117/78 115/79 134/83  Pulse: 100 (!) 102 100 (!) 103  Resp: 18 18 18 18   Temp:      TempSrc:      SpO2:  100% 99% 99% 98%  Weight:      Height:        General exam: Pleasant young female, moderately built and obese, looks older than stated age, lying comfortably propped up in bed without distress. Respiratory system: Occasional basal crackles but otherwise clear to auscultation.  No increased work of breathing. Cardiovascular system: S1 & S2 heard, RRR. No JVD, murmurs, rubs, gallops or clicks.  Left > right lower extremity trace edema.  Telemetry personally reviewed: Sinus rhythm. Gastrointestinal system: Abdomen is nondistended, soft and nontender. No organomegaly or masses felt. Normal bowel sounds heard. Central nervous system: Alert and oriented. No focal neurological deficits.  Patient is edentulous and difficult to understand speech at times. Extremities: Symmetric 5 x 5 power.  Left lower extremity diffusely swollen, somewhat tight, faint diffuse erythematous hue, warm compared to the right lower extremity with associated tenderness.  Has chronic left dorsum  of foot wound as noted in picture below without overt drainage but seems to have slough.  Unable to palpate left dorsalis pedis or posterior tibial. Skin: No rashes, lesions or ulcers Psychiatry: Judgement and insight appear normal. Mood & affect appropriate.   Picture from admission 02/23/2020   Picture from admission 02/23/2020    Picture from 02/24/2020     Data Reviewed:   I have personally reviewed following labs and imaging studies   CBC: Recent Labs  Lab 02/23/20 0952 02/24/20 0355  WBC 11.4* 11.3*  NEUTROABS 9.3*  --   HGB 12.4 9.9*  HCT 40.4 32.7*  MCV 82.1 82.4  PLT 419* 735    Basic Metabolic Panel: Recent Labs  Lab 02/23/20 0952 02/24/20 0355  NA 133* 136  K 3.7 3.2*  CL 97* 104  CO2 26 24  GLUCOSE 439* 117*  BUN 16 14  CREATININE 1.17* 1.38*  CALCIUM 8.3* 7.4*    Liver Function Tests: Recent Labs  Lab 02/23/20 0952  AST 18  ALT 18  ALKPHOS 158*  BILITOT 0.6  PROT 7.3  ALBUMIN 2.6*    CBG: Recent Labs  Lab 02/23/20 2200 02/24/20 0747 02/24/20 1257  GLUCAP 181* 87 109*    Microbiology Studies:   Recent Results (from the past 240 hour(s))  Blood Culture (routine x 2)     Status: None (Preliminary result)   Collection Time: 02/23/20  9:52 AM   Specimen: BLOOD  Result Value Ref Range Status   Specimen Description   Final    BLOOD RIGHT ANTECUBITAL Performed at [facility], [place] [address], [place], [place] [address]    Special Requests   Final    BOTTLES DRAWN AEROBIC AND ANAEROBIC Blood Culture adequate volume Performed at [place] [address], [place], [place] [address]    Culture  Setup Time   Final    GRAM NEGATIVE RODS IN BOTH AEROBIC AND ANAEROBIC BOTTLES CRITICAL RESULT CALLED TO, READ BACK BY AND VERIFIED WITH: Irwin Brakeman [id number] 02/24/2020 Mena Goes Performed at [facility], [place] [address], [place], [place] [address]    Culture GRAM NEGATIVE RODS  Final    Report Status PENDING  Incomplete  Blood Culture ID Panel (Reflexed)     Status: Abnormal   Collection Time: 02/23/20  9:52 AM  Result Value Ref Range Status   Enterococcus faecalis NOT DETECTED NOT DETECTED Final   Enterococcus Faecium NOT DETECTED NOT DETECTED Final   Listeria monocytogenes NOT DETECTED NOT DETECTED Final   Staphylococcus species NOT DETECTED NOT DETECTED  Final   Staphylococcus aureus (BCID) NOT DETECTED NOT DETECTED Final   Staphylococcus epidermidis NOT DETECTED NOT DETECTED Final   Staphylococcus lugdunensis NOT DETECTED NOT DETECTED Final   Streptococcus species NOT DETECTED NOT DETECTED Final   Streptococcus agalactiae NOT DETECTED NOT DETECTED Final   Streptococcus pneumoniae NOT DETECTED NOT DETECTED Final   Streptococcus pyogenes NOT DETECTED NOT DETECTED Final   A.calcoaceticus-baumannii NOT DETECTED NOT DETECTED Final   Bacteroides fragilis NOT DETECTED NOT DETECTED Final   Enterobacterales DETECTED (A) NOT DETECTED Final    Comment: Enterobacterales represent a large order of gram negative bacteria, not a single organism. CRITICAL RESULT CALLED TO, READ BACK BY AND VERIFIED WITH: M. LILLISTON,PHARMD 0257 02/24/2020 T. TYSOR    Enterobacter cloacae complex NOT DETECTED NOT DETECTED Final   Escherichia coli NOT DETECTED NOT DETECTED Final   Klebsiella aerogenes NOT DETECTED NOT DETECTED Final   Klebsiella oxytoca NOT DETECTED NOT DETECTED Final   Klebsiella pneumoniae NOT DETECTED NOT DETECTED Final   Proteus species NOT DETECTED NOT DETECTED Final   Salmonella species NOT DETECTED NOT DETECTED Final   Serratia marcescens DETECTED (A) NOT DETECTED Final    Comment: CRITICAL RESULT CALLED TO, READ BACK BY AND VERIFIED WITH: M. LILLISTON,PHARMD 0257 02/24/2020 T. TYSOR    Haemophilus influenzae NOT DETECTED NOT DETECTED Final   Neisseria meningitidis NOT DETECTED NOT DETECTED Final   Pseudomonas aeruginosa NOT DETECTED NOT DETECTED Final    Stenotrophomonas maltophilia NOT DETECTED NOT DETECTED Final   Candida albicans NOT DETECTED NOT DETECTED Final   Candida auris NOT DETECTED NOT DETECTED Final   Candida glabrata NOT DETECTED NOT DETECTED Final   Candida krusei NOT DETECTED NOT DETECTED Final   Candida parapsilosis NOT DETECTED NOT DETECTED Final   Candida tropicalis NOT DETECTED NOT DETECTED Final   Cryptococcus neoformans/gattii NOT DETECTED NOT DETECTED Final   CTX-M ESBL NOT DETECTED NOT DETECTED Final   Carbapenem resistance IMP NOT DETECTED NOT DETECTED Final   Carbapenem resistance KPC NOT DETECTED NOT DETECTED Final   Carbapenem resistance NDM NOT DETECTED NOT DETECTED Final   Carbapenem resist OXA 48 LIKE NOT DETECTED NOT DETECTED Final   Carbapenem resistance VIM NOT DETECTED NOT DETECTED Final    Comment: Performed at [facility], [place]. [address] [place], [place] [address]  Respiratory Panel by RT PCR (Flu A&B, Covid) - Nasopharyngeal Swab     Status: None   Collection Time: 02/23/20  9:55 AM   Specimen: Nasopharyngeal Swab  Result Value Ref Range Status   SARS Coronavirus 2 by RT PCR NEGATIVE NEGATIVE Final    Comment: (NOTE) SARS-CoV-2 target nucleic acids are NOT DETECTED.  The SARS-CoV-2 RNA is generally detectable in upper respiratoy specimens during the acute phase of infection. The lowest concentration of SARS-CoV-2 viral copies this assay can detect is 131 copies/mL. A negative result does not preclude SARS-Cov-2 infection and should not be used as the sole basis for treatment or other patient management decisions. A negative result may occur with  improper specimen collection/handling, submission of specimen other than nasopharyngeal swab, presence of viral mutation(s) within the areas targeted by this assay, and inadequate number of viral copies (<131 copies/mL). A negative result must be combined with clinical observations, patient history, and epidemiological information. The expected  result is Negative.  Fact Sheet for Patients:  PinkCheek.be  Fact Sheet for Healthcare Providers:  GravelBags.it  This test is no t yet approved or cleared by the [place] FDA and  has  been authorized for detection and/or diagnosis of SARS-CoV-2 by FDA under an Emergency Use Authorization (EUA). This EUA will remain  in effect (meaning this test can be used) for the duration of the COVID-19 declaration under Section 564(b)(1) of the Act, 21 U.S.C. section 360bbb-3(b)(1), unless the authorization is terminated or revoked sooner.     Influenza A by PCR NEGATIVE NEGATIVE Final   Influenza B by PCR NEGATIVE NEGATIVE Final    Comment: (NOTE) The Xpert Xpress SARS-CoV-2/FLU/RSV assay is intended as an aid in  the diagnosis of influenza from Nasopharyngeal swab specimens and  should not be used as a sole basis for treatment. Nasal washings and  aspirates are unacceptable for Xpert Xpress SARS-CoV-2/FLU/RSV  testing.  Fact Sheet for Patients: PinkCheek.be  Fact Sheet for Healthcare Providers: GravelBags.it  This test is not yet approved or cleared by the [place] FDA and  has been authorized for detection and/or diagnosis of SARS-CoV-2 by  FDA under an Emergency Use Authorization (EUA). This EUA will remain  in effect (meaning this test can be used) for the duration of the  Covid-19 declaration under Section 564(b)(1) of the Act, 21  U.S.C. section 360bbb-3(b)(1), unless the authorization is  terminated or revoked. Performed at [facility], [place] [address] [place], [place] [address]   Blood Culture (routine x 2)     Status: None (Preliminary result)   Collection Time: 02/23/20  9:57 AM   Specimen: BLOOD RIGHT FOREARM  Result Value Ref Range Status   Specimen Description   Final    BLOOD RIGHT FOREARM Performed at [place], [place] [address] [place], [place] [address]    Special Requests   Final    BOTTLES DRAWN AEROBIC AND ANAEROBIC Blood Culture results may not be optimal due to an inadequate volume of blood received in culture bottles Performed at [place] [address], [place], [place] [address]    Culture   Final    NO GROWTH < 24 HOURS Performed at [place] [address], [place], [place] [address]    Report Status PENDING  Incomplete  Urine culture     Status: Abnormal   Collection Time: 02/23/20 12:09 PM   Specimen: In/Out Cath Urine  Result Value Ref Range Status   Specimen Description   Final    IN/OUT CATH URINE Performed at [place] [address], [place], [place] [address]    Special Requests   Final    NONE Performed at [facility], [place] [address] [place], [place] [address]    Culture (A)  Final    <10,000 COLONIES/mL INSIGNIFICANT GROWTH Performed at [facility] [address], [place], [place] [address]    Report Status 02/24/2020 FINAL  Final     Radiology Studies:  DG Chest Port 1 View  Result Date: 02/23/2020 CLINICAL DATA:  Concern for sepsis EXAM: PORTABLE CHEST 1 VIEW COMPARISON:  10/09/2019 FINDINGS: Increased bibasilar airspace opacities worse on the left obscuring the left hemidiaphragm concerning for bibasilar pneumonia. Stable cardiomegaly. No CHF pattern or large effusion. No pneumothorax. Aorta atherosclerotic. IMPRESSION: Bibasilar airspace process, worse on the left concerning for pneumonia. Electronically Signed   By: Jerilynn Mages.  Shick M.D.   On: 02/23/2020 10:24   DG Foot 2 Views Left  Result Date: 02/23/2020 CLINICAL DATA:  Chronic wound EXAM: LEFT FOOT - 2 VIEW COMPARISON:  Sep 21, 2019 FINDINGS: Frontal and lateral views  were obtained. There is soft tissue swelling. There is suggestion of soft tissue air medial to the distal aspect of the first metatarsal. There is no erosive  change or osteomyelitis. No fracture or dislocation. Joint spaces overall appear unremarkable. There are prominent posterior and inferior calcaneal spurs. IMPRESSION: Soft tissue swelling with soft tissue air medial to the mid to distal first metatarsal. No bony destruction or erosion. No appreciable joint space narrowing. There are calcaneal spurs. No fracture or dislocation evident. Electronically Signed   By: Lowella Grip III M.D.   On: 02/23/2020 10:22     Scheduled Meds:   . amitriptyline  200 mg Oral QHS  . ezetimibe  10 mg Oral QPC supper  . gabapentin  800 mg Oral TID  . insulin aspart  0-15 Units Subcutaneous TID WC  . insulin aspart  0-5 Units Subcutaneous QHS  . insulin glargine  45 Units Subcutaneous QHS  . lipase/protease/amylase  72,000 Units Oral TID WC  . oxyCODONE  10 mg Oral TID  . rosuvastatin  40 mg Oral Daily  . sodium chloride flush  3 mL Intravenous Q12H    Continuous Infusions:   . ceFEPime (MAXIPIME) IV    . lactated ringers Stopped (02/24/20 0946)  . metronidazole 500 mg (02/24/20 0946)     LOS: 1 day     Vernell Leep, MD, [place], [facility]. [facility]    To contact the attending provider between 7A-7P or the covering provider during after hours 7P-7A, please log into the web site [email or web address] and access using universal [place] password for that web site. If you do not have the password, please call the hospital operator.  02/24/2020, 1:24 PM

## 2020-02-24 NOTE — ED Notes (Signed)
Patient's daughter [name] called and updated on patient status

## 2020-02-24 NOTE — ED Notes (Signed)
hospitalist at bedside

## 2020-02-24 NOTE — ED Notes (Signed)
ED TO INPATIENT HANDOFF REPORT  ED Nurse Name and Phone #: Luetta Nutting  S Name/Age/Gender Morgan Beasley 59 y.o. female Room/Bed: WA23/WA23  Code Status   Code Status: Full Code  Home/SNF/Other Home Patient oriented to: self, place, time and situation Is this baseline? Yes   Triage Complete: Triage complete  Chief Complaint Septic shock ([place]) [A41.9, R65.21]  Triage Note No notes on file   Allergies Allergies  Allergen Reactions  . Tramadol Swelling  . Nsaids Other (See Comments)    Pancreatitis  . Tolmetin Other (See Comments)    Pancreatitis  . Tylenol [Acetaminophen] Other (See Comments)    unknown  . Aspirin Other (See Comments)    "Makes my pancreas act up"     Level of Care/Admitting Diagnosis ED Disposition    ED Disposition Condition [place]: [place] [100100]  Level of Care: Progressive [102]  Admit to Progressive based on following criteria: MULTISYSTEM THREATS such as stable sepsis, metabolic/electrolyte imbalance with or without encephalopathy that is responding to early treatment.  May admit patient to [facility] or [facility] if equivalent level of care is available:: No  Covid Evaluation: Confirmed COVID Negative  Diagnosis: Septic shock [facility]) [[id number]]  Admitting Physician: Junius Argyle  Attending Physician: Annita Brod [[id number]]  Estimated length of stay: past midnight tomorrow  Certification:: I certify this patient will need inpatient services for at least 2 midnights       B Medical/Surgery History Past Medical History:  Diagnosis Date  . Alcohol dependence ([place])   . Anemia   . Anxiety   . Breast cancer ([place])   . Chronic combined systolic and diastolic CHF (congestive heart failure) ([place])   . Cigarette nicotine dependence   . CKD (chronic kidney disease), stage III ([place])   . Colon polyps   . COPD (chronic obstructive pulmonary disease) ([place])   . Diabetes mellitus  without complication ([place])   . Diabetic neuropathy ([place])   . Elevated lipase   . Gout   . Hyperlipemia   . Hypertension   . Insomnia   . Lymphedema   . Marijuana use   . Mild CAD 2016   a. NSTEMI 2016 in context of cocaine abuse, 50% RCA% at that time.  . OSA treated with BiPAP   . PAD (peripheral artery disease) ([place])    a. s/p L SFA stenting 08/2019. b. left fem-to-below-knee-popliteal bypass 09/2019.  Marland Kitchen Sleep apnea    wears BIPAP  . Ulcer of foot ([place])    right   Past Surgical History:  Procedure Laterality Date  . ABDOMINAL AORTOGRAM W/LOWER EXTREMITY N/A 09/26/2019   Procedure: ABDOMINAL AORTOGRAM W/LOWER EXTREMITY;  Surgeon: Angelia Mould, MD;  Location: [place] [facility];  Service: Cardiovascular;  Laterality: N/A;  . ABDOMINAL AORTOGRAM W/LOWER EXTREMITY Bilateral 12/08/2019   Procedure: ABDOMINAL AORTOGRAM W/LOWER EXTREMITY;  Surgeon: Waynetta Sandy, MD;  Location: [place] [facility];  Service: Cardiovascular;  Laterality: Bilateral;  . ABDOMINAL AORTOGRAM W/LOWER EXTREMITY Left 01/26/2020   Procedure: ABDOMINAL AORTOGRAM W/LOWER EXTREMITY;  Surgeon: Waynetta Sandy, MD;  Location: [place] [facility];  Service: Cardiovascular;  Laterality: Left;  . ABDOMINAL HYSTERECTOMY    . [place] [facility]  . FEMORAL-POPLITEAL BYPASS GRAFT Left 10/14/2019   Procedure: BYPASS GRAFT LEFT FEMORAL-POPLITEAL ARTERY USING NONREVERSED SAPHENOUS VEIN;  Surgeon: Waynetta Sandy, MD;  Location: [place];  Service: Vascular;  Laterality: Left;  .  MASTECTOMY Right April 2016  . PERIPHERAL VASCULAR ATHERECTOMY Left 01/26/2020   Procedure: PERIPHERAL VASCULAR ATHERECTOMY;  Surgeon: Waynetta Sandy, MD;  Location: [place];  Service: Cardiovascular;  Laterality: Left;  PT and AT - [facility]  . PERIPHERAL VASCULAR BALLOON ANGIOPLASTY Left 01/26/2020   Procedure: PERIPHERAL VASCULAR BALLOON ANGIOPLASTY;  Surgeon: Waynetta Sandy,  MD;  Location: [place] [facility];  Service: Cardiovascular;  Laterality: Left;  [facility] [place]   . PERIPHERAL VASCULAR INTERVENTION Left 09/26/2019   Procedure: PERIPHERAL VASCULAR INTERVENTION;  Surgeon: Angelia Mould, MD;  Location: [place] [facility];  Service: Cardiovascular;  Laterality: Left;  superficial femoral     A IV Location/Drains/Wounds Patient Lines/Drains/Airways Status    Active Line/Drains/Airways    Name Placement date Placement time Site Days   Peripheral IV 02/23/20 Left Antecubital 02/23/20  0937  Antecubital  1   Peripheral IV 02/23/20 Anterior;Left Forearm 02/23/20  1023  Forearm  1   External Urinary Catheter 10/18/19  0425  --  129   Incision (Closed) 10/14/19 Groin Left 10/14/19  1429   133   Incision (Closed) 10/14/19 Thigh Left 10/14/19  1429   133   Incision (Closed) 10/14/19 Leg Left;Lower 10/14/19  1429   133   Incision (Closed) 10/14/19 Thigh Left 10/14/19  1511   133          Intake/Output Last 24 hours  Intake/Output Summary (Last 24 hours) at 02/24/2020 0962 Last data filed at 02/24/2020 0749 Gross per 24 hour  Intake 748.58 ml  Output --  Net 748.58 ml    Labs/Imaging Results for orders placed or performed during the hospital encounter of 02/23/20 (from the past 48 hour(s))  CBG monitoring, ED     Status: Abnormal   Collection Time: 02/23/20  9:30 AM  Result Value Ref Range   Glucose-Capillary 406 (H) 70 - 99 mg/dL    Comment: Glucose reference range applies only to samples taken after fasting for at least 8 hours.  Lactic acid, plasma     Status: Abnormal   Collection Time: 02/23/20  9:52 AM  Result Value Ref Range   Lactic Acid, Venous 2.6 (HH) 0.5 - 1.9 mmol/L    Comment: CRITICAL RESULT CALLED TO, READ BACK BY AND VERIFIED WITH: JACOBS,D. RN AT 1036 02/23/20 MULLINS,T Performed at [facility], [place] [address] [place], [place] [address]   Comprehensive metabolic panel     Status: Abnormal   Collection Time:  02/23/20  9:52 AM  Result Value Ref Range   Sodium 133 (L) 135 - 145 mmol/L   Potassium 3.7 3.5 - 5.1 mmol/L   Chloride 97 (L) 98 - 111 mmol/L   CO2 26 22 - 32 mmol/L   Glucose, Bld 439 (H) 70 - 99 mg/dL    Comment: Glucose reference range applies only to samples taken after fasting for at least 8 hours.   BUN 16 6 - 20 mg/dL   Creatinine, Ser 1.17 (H) 0.44 - 1.00 mg/dL   Calcium 8.3 (L) 8.9 - 10.3 mg/dL   Total Protein 7.3 6.5 - 8.1 g/dL   Albumin 2.6 (L) 3.5 - 5.0 g/dL   AST 18 15 - 41 U/L   ALT 18 0 - 44 U/L   Alkaline Phosphatase 158 (H) 38 - 126 U/L   Total Bilirubin 0.6 0.3 - 1.2 mg/dL   GFR, Estimated 54 (L) >60 mL/min    Comment: (NOTE) Calculated using the CKD-EPI Creatinine Equation (2021)  Anion gap 10 5 - 15    Comment: Performed at [facility], [place] [address] [place], [place] [address]  CBC WITH DIFFERENTIAL     Status: Abnormal   Collection Time: 02/23/20  9:52 AM  Result Value Ref Range   WBC 11.4 (H) 4.0 - 10.5 K/uL   RBC 4.92 3.87 - 5.11 MIL/uL   Hemoglobin 12.4 12.0 - 15.0 g/dL   HCT 40.4 36 - 46 %   MCV 82.1 80.0 - 100.0 fL   MCH 25.2 (L) 26.0 - 34.0 pg   MCHC 30.7 30.0 - 36.0 g/dL   RDW 18.8 (H) 11.5 - 15.5 %   Platelets 419 (H) 150 - 400 K/uL   nRBC 0.0 0.0 - 0.2 %   Neutrophils Relative % 83 %   Neutro Abs 9.3 (H) 1.7 - 7.7 K/uL   Lymphocytes Relative 13 %   Lymphs Abs 1.4 0.7 - 4.0 K/uL   Monocytes Relative 4 %   Monocytes Absolute 0.5 0.1 - 1.0 K/uL   Eosinophils Relative 0 %   Eosinophils Absolute 0.1 0.0 - 0.5 K/uL   Basophils Relative 0 %   Basophils Absolute 0.0 0.0 - 0.1 K/uL   Immature Granulocytes 0 %   Abs Immature Granulocytes 0.04 0.00 - 0.07 K/uL    Comment: Performed at [facility], [place] [address], [place], [place] [address]  Protime-INR     Status: None   Collection Time: 02/23/20  9:52 AM  Result Value Ref Range   Prothrombin Time 14.9 11.4 - 15.2 seconds   INR 1.2 0.8 - 1.2     Comment: (NOTE) INR goal varies based on device and disease states. Performed at [facility], [place] [address], [place], [place] [address]   APTT     Status: None   Collection Time: 02/23/20  9:52 AM  Result Value Ref Range   aPTT 27 24 - 36 seconds    Comment: Performed at [facility], [place] [address] [place], [place] [address]  Blood Culture (routine x 2)     Status: None (Preliminary result)   Collection Time: 02/23/20  9:52 AM   Specimen: BLOOD  Result Value Ref Range   Specimen Description      BLOOD RIGHT ANTECUBITAL Performed at [facility] [address], [place], [place] [address]    Special Requests      BOTTLES DRAWN AEROBIC AND ANAEROBIC Blood Culture adequate volume Performed at [facility] [address], [place], [place] [address]    Culture  Setup Time      GRAM NEGATIVE RODS IN BOTH AEROBIC AND ANAEROBIC BOTTLES CRITICAL RESULT CALLED TO, READ BACK BY AND VERIFIED WITH: Irwin Brakeman 0923 02/24/2020 Mena Goes Performed at [facility], [place] [address], [place], [place] [address]    Culture GRAM NEGATIVE RODS    Report Status PENDING   Brain natriuretic peptide     Status: Abnormal   Collection Time: 02/23/20  9:52 AM  Result Value Ref Range   B Natriuretic Peptide 868.5 (H) 0.0 - 100.0 pg/mL    Comment: Performed at [facility], [place] [address] [place], [place] [address]  Blood Culture ID Panel (Reflexed)     Status: Abnormal   Collection Time: 02/23/20  9:52 AM  Result Value Ref Range   Enterococcus faecalis NOT DETECTED NOT DETECTED   Enterococcus Faecium NOT DETECTED NOT DETECTED   Listeria monocytogenes  NOT DETECTED NOT DETECTED   Staphylococcus species NOT DETECTED NOT DETECTED   Staphylococcus aureus (BCID) NOT DETECTED NOT DETECTED   Staphylococcus epidermidis NOT DETECTED NOT DETECTED   Staphylococcus lugdunensis NOT DETECTED NOT DETECTED    Streptococcus species NOT DETECTED NOT DETECTED   Streptococcus agalactiae NOT DETECTED NOT DETECTED   Streptococcus pneumoniae NOT DETECTED NOT DETECTED   Streptococcus pyogenes NOT DETECTED NOT DETECTED   A.calcoaceticus-baumannii NOT DETECTED NOT DETECTED   Bacteroides fragilis NOT DETECTED NOT DETECTED   Enterobacterales DETECTED (A) NOT DETECTED    Comment: Enterobacterales represent a large order of gram negative bacteria, not a single organism. CRITICAL RESULT CALLED TO, READ BACK BY AND VERIFIED WITH: M. LILLISTON,PHARMD 0257 02/24/2020 T. TYSOR    Enterobacter cloacae complex NOT DETECTED NOT DETECTED   Escherichia coli NOT DETECTED NOT DETECTED   Klebsiella aerogenes NOT DETECTED NOT DETECTED   Klebsiella oxytoca NOT DETECTED NOT DETECTED   Klebsiella pneumoniae NOT DETECTED NOT DETECTED   Proteus species NOT DETECTED NOT DETECTED   Salmonella species NOT DETECTED NOT DETECTED   Serratia marcescens DETECTED (A) NOT DETECTED    Comment: CRITICAL RESULT CALLED TO, READ BACK BY AND VERIFIED WITH: M. LILLISTON,PHARMD 0257 02/24/2020 T. TYSOR    Haemophilus influenzae NOT DETECTED NOT DETECTED   Neisseria meningitidis NOT DETECTED NOT DETECTED   Pseudomonas aeruginosa NOT DETECTED NOT DETECTED   Stenotrophomonas maltophilia NOT DETECTED NOT DETECTED   Candida albicans NOT DETECTED NOT DETECTED   Candida auris NOT DETECTED NOT DETECTED   Candida glabrata NOT DETECTED NOT DETECTED   Candida krusei NOT DETECTED NOT DETECTED   Candida parapsilosis NOT DETECTED NOT DETECTED   Candida tropicalis NOT DETECTED NOT DETECTED   Cryptococcus neoformans/gattii NOT DETECTED NOT DETECTED   CTX-M ESBL NOT DETECTED NOT DETECTED   Carbapenem resistance IMP NOT DETECTED NOT DETECTED   Carbapenem resistance KPC NOT DETECTED NOT DETECTED   Carbapenem resistance NDM NOT DETECTED NOT DETECTED   Carbapenem resist OXA 48 LIKE NOT DETECTED NOT DETECTED   Carbapenem resistance VIM NOT DETECTED NOT  DETECTED    Comment: Performed at [facility] [address], [place], [place] [address]  Respiratory Panel by RT PCR (Flu A&B, Covid) - Nasopharyngeal Swab     Status: None   Collection Time: 02/23/20  9:55 AM   Specimen: Nasopharyngeal Swab  Result Value Ref Range   SARS Coronavirus 2 by RT PCR NEGATIVE NEGATIVE    Comment: (NOTE) SARS-CoV-2 target nucleic acids are NOT DETECTED.  The SARS-CoV-2 RNA is generally detectable in upper respiratoy specimens during the acute phase of infection. The lowest concentration of SARS-CoV-2 viral copies this assay can detect is 131 copies/mL. A negative result does not preclude SARS-Cov-2 infection and should not be used as the sole basis for treatment or other patient management decisions. A negative result may occur with  improper specimen collection/handling, submission of specimen other than nasopharyngeal swab, presence of viral mutation(s) within the areas targeted by this assay, and inadequate number of viral copies (<131 copies/mL). A negative result must be combined with clinical observations, patient history, and epidemiological information. The expected result is Negative.  Fact Sheet for Patients:  PinkCheek.be  Fact Sheet for Healthcare Providers:  GravelBags.it  This test is no t yet approved or cleared by the [place] FDA and  has been authorized for detection and/or diagnosis of SARS-CoV-2 by FDA under an Emergency Use Authorization (EUA). This EUA will remain  in effect (meaning this test  can be used) for the duration of the COVID-19 declaration under Section 564(b)(1) of the Act, 21 U.S.C. section 360bbb-3(b)(1), unless the authorization is terminated or revoked sooner.     Influenza A by PCR NEGATIVE NEGATIVE   Influenza B by PCR NEGATIVE NEGATIVE    Comment: (NOTE) The Xpert Xpress SARS-CoV-2/FLU/RSV assay is intended as an aid in  the diagnosis of  influenza from Nasopharyngeal swab specimens and  should not be used as a sole basis for treatment. Nasal washings and  aspirates are unacceptable for Xpert Xpress SARS-CoV-2/FLU/RSV  testing.  Fact Sheet for Patients: PinkCheek.be  Fact Sheet for Healthcare Providers: GravelBags.it  This test is not yet approved or cleared by the [place] FDA and  has been authorized for detection and/or diagnosis of SARS-CoV-2 by  FDA under an Emergency Use Authorization (EUA). This EUA will remain  in effect (meaning this test can be used) for the duration of the  Covid-19 declaration under Section 564(b)(1) of the Act, 21  U.S.C. section 360bbb-3(b)(1), unless the authorization is  terminated or revoked. Performed at [facility], [place] [address], [place], [place] [address]   Blood Culture (routine x 2)     Status: None (Preliminary result)   Collection Time: 02/23/20  9:57 AM   Specimen: BLOOD RIGHT FOREARM  Result Value Ref Range   Specimen Description      BLOOD RIGHT FOREARM Performed at [facility], [place] [address] [place], [place] [address]    Special Requests      BOTTLES DRAWN AEROBIC AND ANAEROBIC Blood Culture results may not be optimal due to an inadequate volume of blood received in culture bottles Performed at [facility], [place] [address], [place], [place] [address]    Culture      NO GROWTH < 24 HOURS Performed at [facility] [address], [place], [place] [address]    Report Status PENDING   I-Stat beta hCG blood, ED     Status: None   Collection Time: 02/23/20 10:03 AM  Result Value Ref Range   I-stat hCG, quantitative <5.0 <5 mIU/mL   Comment 3            Comment:   GEST. AGE      CONC.  (mIU/mL)   <=1 WEEK        5 - 50     2 WEEKS       50 - 500     3 WEEKS       100 - 10,000     4 WEEKS     1,000 - 30,000        FEMALE AND NON-PREGNANT FEMALE:      LESS THAN 5 mIU/mL   Procalcitonin - Baseline     Status: None   Collection Time: 02/23/20 11:49 AM  Result Value Ref Range   Procalcitonin <0.10 ng/mL    Comment:        Interpretation: PCT (Procalcitonin) <= 0.5 ng/mL: Systemic infection (sepsis) is not likely. Local bacterial infection is possible. (NOTE)       Sepsis PCT Algorithm           Lower Respiratory Tract                                      Infection PCT Algorithm    ----------------------------     ----------------------------  PCT < 0.25 ng/mL                PCT < 0.10 ng/mL          Strongly encourage             Strongly discourage   discontinuation of antibiotics    initiation of antibiotics    ----------------------------     -----------------------------       PCT 0.25 - 0.50 ng/mL            PCT 0.10 - 0.25 ng/mL               OR       >80% decrease in PCT            Discourage initiation of                                            antibiotics      Encourage discontinuation           of antibiotics    ----------------------------     -----------------------------         PCT >= 0.50 ng/mL              PCT 0.26 - 0.50 ng/mL               AND        <80% decrease in PCT             Encourage initiation of                                             antibiotics       Encourage continuation           of antibiotics    ----------------------------     -----------------------------        PCT >= 0.50 ng/mL                  PCT > 0.50 ng/mL               AND         increase in PCT                  Strongly encourage                                      initiation of antibiotics    Strongly encourage escalation           of antibiotics                                     -----------------------------                                           PCT <= 0.25 ng/mL  OR                                        > 80% decrease in PCT                                       Discontinue / Do not initiate                                             antibiotics  Performed at [facility] [address] [place], [place] [address]   Lactic acid, plasma     Status: Abnormal   Collection Time: 02/23/20 11:52 AM  Result Value Ref Range   Lactic Acid, Venous >11.0 (HH) 0.5 - 1.9 mmol/L    Comment: CRITICAL RESULT CALLED TO, READ BACK BY AND VERIFIED WITH: DOWD,P. RN AT 1320 02/23/20 MULLINS,T Performed at [facility], [place] [address] [place], [place] [address]   Urinalysis, Routine w reflex microscopic     Status: Abnormal   Collection Time: 02/23/20 12:09 PM  Result Value Ref Range   Color, Urine YELLOW YELLOW   APPearance CLEAR CLEAR   Specific Gravity, Urine 1.021 1.005 - 1.030   pH 7.0 5.0 - 8.0   Glucose, UA >=500 (A) NEGATIVE mg/dL   Hgb urine dipstick SMALL (A) NEGATIVE   Bilirubin Urine NEGATIVE NEGATIVE   Ketones, ur NEGATIVE NEGATIVE mg/dL   Protein, ur >=300 (A) NEGATIVE mg/dL   Nitrite NEGATIVE NEGATIVE   Leukocytes,Ua NEGATIVE NEGATIVE   RBC / HPF 11-20 0 - 5 RBC/hpf   WBC, UA 0-5 0 - 5 WBC/hpf   Bacteria, UA NONE SEEN NONE SEEN   Squamous Epithelial / LPF 0-5 0 - 5    Comment: Performed at [facility], [place] [address], [place], [place] [address]  Lactic acid, plasma     Status: Abnormal   Collection Time: 02/23/20  2:32 PM  Result Value Ref Range   Lactic Acid, Venous 3.6 (HH) 0.5 - 1.9 mmol/L    Comment: CRITICAL VALUE NOTED.  VALUE IS CONSISTENT WITH PREVIOUSLY REPORTED AND CALLED VALUE. Performed at [facility], [place] [address], [place],  [address]   Hemoglobin A1c     Status: Abnormal   Collection Time: 02/23/20  2:53 PM  Result Value Ref Range   Hgb A1c MFr Bld 15.1 (H) 4.8 - 5.6 %    Comment: (NOTE)         Prediabetes: 5.7 - 6.4         Diabetes: >6.4         Glycemic control for adults with diabetes: <7.0    Mean Plasma Glucose 387  mg/dL    Comment: (NOTE) Performed At: [facility] [address] [place], [place] [id number] Rush Farmer MD OJ:[id number]   CBG monitoring, ED     Status: Abnormal   Collection Time: 02/23/20  3:00 PM  Result Value Ref Range   Glucose-Capillary 267 (H) 70 - 99 mg/dL    Comment: Glucose reference range applies only to samples taken after fasting for at least 8 hours.  Lactic acid, plasma     Status: Abnormal  Collection Time: 02/23/20  7:34 PM  Result Value Ref Range   Lactic Acid, Venous >11.0 (HH) 0.5 - 1.9 mmol/L    Comment: CRITICAL VALUE NOTED.  VALUE IS CONSISTENT WITH PREVIOUSLY REPORTED AND CALLED VALUE. Performed at [facility], [place] [address], [place], [place] [address]   CBG monitoring, ED     Status: Abnormal   Collection Time: 02/23/20  7:46 PM  Result Value Ref Range   Glucose-Capillary 172 (H) 70 - 99 mg/dL    Comment: Glucose reference range applies only to samples taken after fasting for at least 8 hours.  CBG monitoring, ED     Status: Abnormal   Collection Time: 02/23/20 10:00 PM  Result Value Ref Range   Glucose-Capillary 181 (H) 70 - 99 mg/dL    Comment: Glucose reference range applies only to samples taken after fasting for at least 8 hours.   Comment 1 Notify RN   Protime-INR     Status: Abnormal   Collection Time: 02/24/20  3:55 AM  Result Value Ref Range   Prothrombin Time 16.8 (H) 11.4 - 15.2 seconds   INR 1.4 (H) 0.8 - 1.2    Comment: (NOTE) INR goal varies based on device and disease states. Performed at [facility], [place] [address] [place], [place] [address]   Cortisol-am, blood     Status: Abnormal   Collection Time: 02/24/20  3:55 AM  Result Value Ref Range   Cortisol - AM 25.5 (H) 6.7 - 22.6 ug/dL    Comment: Performed at [facility] [address], [place],  [address]  Procalcitonin     Status: None   Collection Time: 02/24/20  3:55 AM  Result Value Ref Range    Procalcitonin 0.67 ng/mL    Comment:        Interpretation: PCT > 0.5 ng/mL and <= 2 ng/mL: Systemic infection (sepsis) is possible, but other conditions are known to elevate PCT as well. (NOTE)       Sepsis PCT Algorithm           Lower Respiratory Tract                                      Infection PCT Algorithm    ----------------------------     ----------------------------         PCT < 0.25 ng/mL                PCT < 0.10 ng/mL          Strongly encourage             Strongly discourage   discontinuation of antibiotics    initiation of antibiotics    ----------------------------     -----------------------------       PCT 0.25 - 0.50 ng/mL            PCT 0.10 - 0.25 ng/mL               OR       >80% decrease in PCT            Discourage initiation of                                            antibiotics  Encourage discontinuation           of antibiotics    ----------------------------     -----------------------------         PCT >= 0.50 ng/mL              PCT 0.26 - 0.50 ng/mL                AND       <80% decrease in PCT             Encourage initiation of                                             antibiotics       Encourage continuation           of antibiotics    ----------------------------     -----------------------------        PCT >= 0.50 ng/mL                  PCT > 0.50 ng/mL               AND         increase in PCT                  Strongly encourage                                      initiation of antibiotics    Strongly encourage escalation           of antibiotics                                     -----------------------------                                           PCT <= 0.25 ng/mL                                                 OR                                        > 80% decrease in PCT                                      Discontinue / Do not initiate                                             antibiotics  Performed at [facility] [address] [place], [place] [address]   CBC     Status: Abnormal   Collection Time: 02/24/20  3:55 AM  Result  Value Ref Range   WBC 11.3 (H) 4.0 - 10.5 K/uL   RBC 3.97 3.87 - 5.11 MIL/uL   Hemoglobin 9.9 (L) 12.0 - 15.0 g/dL   HCT 32.7 (L) 36 - 46 %   MCV 82.4 80.0 - 100.0 fL   MCH 24.9 (L) 26.0 - 34.0 pg   MCHC 30.3 30.0 - 36.0 g/dL   RDW 18.4 (H) 11.5 - 15.5 %   Platelets 313 150 - 400 K/uL   nRBC 0.3 (H) 0.0 - 0.2 %    Comment: Performed at [facility], [place] [address], [place], [place] [address]  Basic metabolic panel     Status: Abnormal   Collection Time: 02/24/20  3:55 AM  Result Value Ref Range   Sodium 136 135 - 145 mmol/L   Potassium 3.2 (L) 3.5 - 5.1 mmol/L   Chloride 104 98 - 111 mmol/L   CO2 24 22 - 32 mmol/L   Glucose, Bld 117 (H) 70 - 99 mg/dL    Comment: Glucose reference range applies only to samples taken after fasting for at least 8 hours.   BUN 14 6 - 20 mg/dL   Creatinine, Ser 1.38 (H) 0.44 - 1.00 mg/dL   Calcium 7.4 (L) 8.9 - 10.3 mg/dL   GFR, Estimated 44 (L) >60 mL/min    Comment: (NOTE) Calculated using the CKD-EPI Creatinine Equation (2021)    Anion gap 8 5 - 15    Comment: Performed at [facility], [place] [address] [address] [place], [place] [id number]  CBG monitoring, ED     Status: None   Collection Time: 02/24/20  7:47 AM  Result Value Ref Range   Glucose-Capillary 87 70 - 99 mg/dL    Comment: Glucose reference range applies only to samples taken after fasting for at least 8 hours.   DG Chest Port 1 View  Result Date: 02/23/2020 CLINICAL DATA:  Concern for sepsis EXAM: PORTABLE CHEST 1 VIEW COMPARISON:  10/09/2019 FINDINGS: Increased bibasilar airspace opacities worse on the left obscuring the left hemidiaphragm concerning for bibasilar pneumonia. Stable cardiomegaly. No CHF pattern or large effusion. No pneumothorax. Aorta atherosclerotic. IMPRESSION: Bibasilar airspace process, worse  on the left concerning for pneumonia. Electronically Signed   By: Jerilynn Mages.  Shick M.D.   On: 02/23/2020 10:24   DG Foot 2 Views Left  Result Date: 02/23/2020 CLINICAL DATA:  Chronic wound EXAM: LEFT FOOT - 2 VIEW COMPARISON:  Sep 21, 2019 FINDINGS: Frontal and lateral views were obtained. There is soft tissue swelling. There is suggestion of soft tissue air medial to the distal aspect of the first metatarsal. There is no erosive change or osteomyelitis. No fracture or dislocation. Joint spaces overall appear unremarkable. There are prominent posterior and inferior calcaneal spurs. IMPRESSION: Soft tissue swelling with soft tissue air medial to the mid to distal first metatarsal. No bony destruction or erosion. No appreciable joint space narrowing. There are calcaneal spurs. No fracture or dislocation evident. Electronically Signed   By: Lowella Grip III M.D.   On: 02/23/2020 10:22    Pending Labs Unresulted Labs (From admission, onward)          Start     Ordered   02/24/20 0838  Lactic acid, plasma  ONCE - STAT,   STAT        02/24/20 0837   02/24/20 9741  Basic metabolic panel  (Hyperglycemia (not DKA or HHS))  Daily,   STAT      02/23/20 1432  02/23/20 0952  Urine culture  (Septic presentation on arrival (screening labs, nursing and treatment orders for obvious sepsis))  ONCE - STAT,   STAT        02/23/20 0951          Vitals/Pain Today's Vitals   02/24/20 0730 02/24/20 0800 02/24/20 0830 02/24/20 0900  BP: 94/70 99/71 97/68    Pulse: 100 99 95 100  Resp: 18 18 18 18   Temp:      TempSrc:      SpO2: 97% 100% 97% 100%  Weight:      Height:      PainSc:        Isolation Precautions No active isolations  Medications Medications  metroNIDAZOLE (FLAGYL) IVPB 500 mg (500 mg Intravenous New Bag/Given 02/24/20 0125)  sodium chloride flush (NS) 0.9 % injection 3 mL (3 mLs Intravenous Given 02/24/20 0921)  acetaminophen (TYLENOL) tablet 650 mg (has no administration in time  range)    Or  acetaminophen (TYLENOL) suppository 650 mg (has no administration in time range)  ondansetron (ZOFRAN) tablet 4 mg (has no administration in time range)    Or  ondansetron (ZOFRAN) injection 4 mg (has no administration in time range)  polyethylene glycol (MIRALAX / GLYCOLAX) packet 17 g (has no administration in time range)  gabapentin (NEURONTIN) capsule 800 mg (800 mg Oral Given 02/24/20 0921)  amitriptyline (ELAVIL) tablet 200 mg (200 mg Oral Given 02/23/20 2234)  rosuvastatin (CRESTOR) tablet 40 mg (40 mg Oral Not Given 02/23/20 1845)  dextrose 50 % solution 0-50 mL (has no administration in time range)  insulin glargine (LANTUS) injection 50 Units (50 Units Subcutaneous Given 02/23/20 2234)  insulin aspart (novoLOG) injection 0-20 Units (0 Units Subcutaneous Not Given 02/24/20 0748)  insulin aspart (novoLOG) injection 0-5 Units (0 Units Subcutaneous Hold 02/23/20 2205)  oxyCODONE (OXYCONTIN) 12 hr tablet 10 mg (10 mg Oral Given 02/24/20 0920)  ezetimibe (ZETIA) tablet 10 mg (has no administration in time range)  lipase/protease/amylase (CREON) capsule 36,000 Units (has no administration in time range)  lipase/protease/amylase (CREON) capsule 72,000 Units (72,000 Units Oral Given 02/24/20 0748)  ceFEPIme (MAXIPIME) 2 g in sodium chloride 0.9 % 100 mL IVPB (has no administration in time range)  lactated ringers infusion ( Intravenous New Bag/Given 02/24/20 0847)  HYDROmorphone (DILAUDID) injection 1 mg (1 mg Intravenous Given 02/23/20 1022)  lactated ringers bolus 2,000 mL (0 mLs Intravenous Stopped 02/23/20 1347)  vancomycin (VANCOREADY) IVPB 1750 mg/350 mL (0 mg Intravenous Stopped 02/23/20 1347)  insulin aspart (novoLOG) injection 14 Units (14 Units Subcutaneous Given 02/23/20 1154)  lactated ringers bolus 1,000 mL (0 mLs Intravenous Stopped 02/23/20 1644)  potassium chloride SA (KLOR-CON) CR tablet 40 mEq (40 mEq Oral Given 02/24/20 0921)    Mobility walks with  person assist Moderate fall risk   Focused Assessments Cardiac Assessment Handoff:    No results found for: CKTOTAL, CKMB, CKMBINDEX, TROPONINI No results found for: DDIMER Does the Patient currently have chest pain? No      R Recommendations: See Admitting Provider Note  Report given to:   Additional Notes: Wound infection to left foot, getting IV antibiotics

## 2020-02-24 NOTE — Progress Notes (Signed)
PHARMACY NOTE:  ANTIMICROBIAL RENAL DOSAGE ADJUSTMENT  Current antimicrobial regimen includes a mismatch between antimicrobial dosage and estimated renal function.  As per policy approved by the Pharmacy & Therapeutics and Medical Executive Committees, the antimicrobial dosage will be adjusted accordingly.  Current antimicrobial dosage:  Cefepime 2g IV q8  Indication: bacteremia  Renal Function:  Estimated Creatinine Clearance: 50.3 mL/min (A) (by C-G formula based on SCr of 1.38 mg/dL (H)). []      On intermittent HD, scheduled: []      On CRRT    Antimicrobial dosage has been changed to:  Cefepime 2g IV q12  Additional comments:   Thank you for allowing pharmacy to be a part of this patient's care.  Kara Mead, [facility] 02/24/2020 7:29 AM

## 2020-02-24 NOTE — Progress Notes (Signed)
PHARMACY - PHYSICIAN COMMUNICATION CRITICAL VALUE ALERT - BLOOD CULTURE IDENTIFICATION (BCID)  Morgan Beasley is an 59 y.o. female who presented to [facility] on 02/23/2020 with a chief complaint of non-healing wounds on LLE.    Assessment:  2/2 Blood cx growing Serratia marcescens- no resistance noted.   Name of physician (or Provider) Contacted: M. Sharlet Salina, NP  Current antibiotics: Rocephin 2gm IV q24h + Flagyl 500mg  IV q8h  Changes to prescribed antibiotics recommended:  Broaden to Cefepime 2gm IV q8h  F/U cx sensitivities  Results for orders placed or performed during the hospital encounter of 02/23/20  Blood Culture ID Panel (Reflexed) (Collected: 02/23/2020  9:52 AM)  Result Value Ref Range   Enterococcus faecalis NOT DETECTED NOT DETECTED   Enterococcus Faecium NOT DETECTED NOT DETECTED   Listeria monocytogenes NOT DETECTED NOT DETECTED   Staphylococcus species NOT DETECTED NOT DETECTED   Staphylococcus aureus (BCID) NOT DETECTED NOT DETECTED   Staphylococcus epidermidis NOT DETECTED NOT DETECTED   Staphylococcus lugdunensis NOT DETECTED NOT DETECTED   Streptococcus species NOT DETECTED NOT DETECTED   Streptococcus agalactiae NOT DETECTED NOT DETECTED   Streptococcus pneumoniae NOT DETECTED NOT DETECTED   Streptococcus pyogenes NOT DETECTED NOT DETECTED   A.calcoaceticus-baumannii NOT DETECTED NOT DETECTED   Bacteroides fragilis NOT DETECTED NOT DETECTED   Enterobacterales DETECTED (A) NOT DETECTED   Enterobacter cloacae complex NOT DETECTED NOT DETECTED   Escherichia coli NOT DETECTED NOT DETECTED   Klebsiella aerogenes NOT DETECTED NOT DETECTED   Klebsiella oxytoca NOT DETECTED NOT DETECTED   Klebsiella pneumoniae NOT DETECTED NOT DETECTED   Proteus species NOT DETECTED NOT DETECTED   Salmonella species NOT DETECTED NOT DETECTED   Serratia marcescens DETECTED (A) NOT DETECTED   Haemophilus influenzae NOT DETECTED NOT DETECTED   Neisseria meningitidis NOT  DETECTED NOT DETECTED   Pseudomonas aeruginosa NOT DETECTED NOT DETECTED   Stenotrophomonas maltophilia NOT DETECTED NOT DETECTED   Candida albicans NOT DETECTED NOT DETECTED   Candida auris NOT DETECTED NOT DETECTED   Candida glabrata NOT DETECTED NOT DETECTED   Candida krusei NOT DETECTED NOT DETECTED   Candida parapsilosis NOT DETECTED NOT DETECTED   Candida tropicalis NOT DETECTED NOT DETECTED   Cryptococcus neoformans/gattii NOT DETECTED NOT DETECTED   CTX-M ESBL NOT DETECTED NOT DETECTED   Carbapenem resistance IMP NOT DETECTED NOT DETECTED   Carbapenem resistance KPC NOT DETECTED NOT DETECTED   Carbapenem resistance NDM NOT DETECTED NOT DETECTED   Carbapenem resist OXA 48 LIKE NOT DETECTED NOT DETECTED   Carbapenem resistance VIM NOT DETECTED NOT DETECTED    Netta Cedars PharmD 02/24/2020  3:06 AM

## 2020-02-24 NOTE — Progress Notes (Addendum)
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 Wounds witnessed on admission with Joesph Fillers and Lonia Skinner.

## 2020-02-24 NOTE — ED Notes (Signed)
Patient's sheets changed

## 2020-02-24 NOTE — Progress Notes (Signed)
Critical Lab  Lactic Acid 2.1 @1901  Handed off to [place], [employer] by [employer], LAB

## 2020-02-24 NOTE — Plan of Care (Signed)
  Problem: Education: Goal: Knowledge of General Education information will improve Description: Including pain rating scale, medication(s)/side effects and non-pharmacologic comfort measures Outcome: Progressing   Problem: Skin Integrity: Goal: Risk for impaired skin integrity will decrease Outcome: Progressing   

## 2020-02-24 NOTE — ED Notes (Signed)
[facility] here to transport patient. 

## 2020-02-25 ENCOUNTER — Inpatient Hospital Stay: Payer: Self-pay

## 2020-02-25 ENCOUNTER — Inpatient Hospital Stay (HOSPITAL_COMMUNITY): Payer: Medicare Other

## 2020-02-25 DIAGNOSIS — A419 Sepsis, unspecified organism: Secondary | ICD-10-CM | POA: Diagnosis not present

## 2020-02-25 DIAGNOSIS — R6521 Severe sepsis with septic shock: Secondary | ICD-10-CM | POA: Diagnosis not present

## 2020-02-25 DIAGNOSIS — R609 Edema, unspecified: Secondary | ICD-10-CM | POA: Diagnosis not present

## 2020-02-25 LAB — BASIC METABOLIC PANEL
Anion gap: 9 (ref 5–15)
BUN: 22 mg/dL — ABNORMAL HIGH (ref 6–20)
CO2: 21 mmol/L — ABNORMAL LOW (ref 22–32)
Calcium: 7.6 mg/dL — ABNORMAL LOW (ref 8.9–10.3)
Chloride: 104 mmol/L (ref 98–111)
Creatinine, Ser: 1.8 mg/dL — ABNORMAL HIGH (ref 0.44–1.00)
GFR, Estimated: 32 mL/min — ABNORMAL LOW (ref >60)
Glucose, Bld: 136 mg/dL — ABNORMAL HIGH (ref 70–99)
Potassium: 4.1 mmol/L (ref 3.5–5.1)
Sodium: 134 mmol/L — ABNORMAL LOW (ref 135–145)

## 2020-02-25 LAB — GLUCOSE, CAPILLARY
Glucose-Capillary: 110 mg/dL — ABNORMAL HIGH (ref 70–99)
Glucose-Capillary: 111 mg/dL — ABNORMAL HIGH (ref 70–99)
Glucose-Capillary: 143 mg/dL — ABNORMAL HIGH (ref 70–99)
Glucose-Capillary: 121 mg/dL — ABNORMAL HIGH (ref 70–99)

## 2020-02-25 LAB — BLOOD GAS, ARTERIAL
Acid-base deficit: 1.7 mmol/L (ref 0.0–2.0)
Bicarbonate: 22.7 mmol/L (ref 20.0–28.0)
Drawn by: 27022
FIO2: 28
O2 Saturation: 97.5 %
Patient temperature: 36.5
pCO2 arterial: 38.5 mmHg (ref 32.0–48.0)
pH, Arterial: 7.385 (ref 7.350–7.450)
pO2, Arterial: 95.4 mmHg (ref 83.0–108.0)

## 2020-02-25 LAB — CBC
HCT: 32 % — ABNORMAL LOW (ref 36.0–46.0)
Hemoglobin: 9.7 g/dL — ABNORMAL LOW (ref 12.0–15.0)
MCH: 25.1 pg — ABNORMAL LOW (ref 26.0–34.0)
MCHC: 30.3 g/dL (ref 30.0–36.0)
MCV: 82.7 fL (ref 80.0–100.0)
Platelets: 322 10*3/uL (ref 150–400)
RBC: 3.87 MIL/uL (ref 3.87–5.11)
RDW: 18.7 % — ABNORMAL HIGH (ref 11.5–15.5)
WBC: 7.3 10*3/uL (ref 4.0–10.5)
nRBC: 0.3 % — ABNORMAL HIGH (ref 0.0–0.2)

## 2020-02-25 LAB — LACTIC ACID, PLASMA: Lactic Acid, Venous: 0.9 mmol/L (ref 0.5–1.9)

## 2020-02-25 LAB — MRSA PCR SCREENING: MRSA by PCR: NEGATIVE

## 2020-02-25 LAB — PROCALCITONIN: Procalcitonin: 0.77 ng/mL

## 2020-02-25 MED ORDER — INSULIN GLARGINE 100 UNIT/ML ~~LOC~~ SOLN
30.0000 [IU] | Freq: Every day | SUBCUTANEOUS | Status: DC
  Administered 2020-02-25 – 2020-02-26 (×2): 30 [IU] via SUBCUTANEOUS
  Filled 2020-02-25 (×4): qty 0.3

## 2020-02-25 MED ORDER — FLUTICASONE FUROATE-VILANTEROL 200-25 MCG/INH IN AEPB
1.0000 | INHALATION_SPRAY | Freq: Every day | RESPIRATORY_TRACT | Status: DC
  Administered 2020-02-25 – 2020-03-15 (×12): 1 via RESPIRATORY_TRACT
  Filled 2020-02-25 (×3): qty 28

## 2020-02-25 MED ORDER — SODIUM CHLORIDE 0.9% FLUSH: 10.0000 mL | INTRAVENOUS | Status: DC | PRN

## 2020-02-25 MED ORDER — CLOPIDOGREL BISULFATE 75 MG PO TABS
75.0000 mg | ORAL_TABLET | Freq: Every day | ORAL | Status: DC
  Administered 2020-02-25 – 2020-03-18 (×20): 75 mg via ORAL
  Filled 2020-02-25 (×21): qty 1

## 2020-02-25 MED ORDER — HEPARIN SODIUM (PORCINE) 5000 UNIT/ML IJ SOLN
5000.0000 [IU] | Freq: Three times a day (TID) | INTRAMUSCULAR | Status: DC
  Administered 2020-02-25 – 2020-02-27 (×8): 5000 [IU] via SUBCUTANEOUS
  Filled 2020-02-25 (×8): qty 1

## 2020-02-25 MED ORDER — SODIUM CHLORIDE 0.9% FLUSH
10.0000 mL | Freq: Two times a day (BID) | INTRAVENOUS | Status: DC
  Administered 2020-02-25 – 2020-02-26 (×3): 10 mL

## 2020-02-25 MED ORDER — COLLAGENASE 250 UNIT/GM EX OINT
TOPICAL_OINTMENT | Freq: Every day | CUTANEOUS | Status: DC
  Administered 2020-03-11: 1 via TOPICAL
  Filled 2020-02-25 (×4): qty 30

## 2020-02-25 MED ORDER — NALOXONE HCL 4 MG/0.1ML NA LIQD: 1.0000 | NASAL | Status: DC | PRN

## 2020-02-25 MED ORDER — NALOXONE HCL 0.4 MG/ML IJ SOLN: 0.1000 mg | Freq: Once | INTRAMUSCULAR | Status: DC

## 2020-02-25 MED ORDER — LACTATED RINGERS IV SOLN: INTRAVENOUS | Status: AC

## 2020-02-25 MED ORDER — CHLORHEXIDINE GLUCONATE CLOTH 2 % EX PADS
6.0000 | MEDICATED_PAD | Freq: Every day | CUTANEOUS | Status: DC
  Administered 2020-02-25 – 2020-03-18 (×21): 6 via TOPICAL

## 2020-02-25 MED ORDER — METRONIDAZOLE 500 MG PO TABS
500.0000 mg | ORAL_TABLET | Freq: Three times a day (TID) | ORAL | Status: DC
  Administered 2020-02-25 – 2020-02-28 (×7): 500 mg via ORAL
  Filled 2020-02-25 (×9): qty 1

## 2020-02-25 MED ORDER — PANTOPRAZOLE SODIUM 40 MG PO TBEC
40.0000 mg | DELAYED_RELEASE_TABLET | Freq: Every day | ORAL | Status: DC
  Administered 2020-02-25 – 2020-03-18 (×20): 40 mg via ORAL
  Filled 2020-02-25 (×7): qty 1
  Filled 2020-02-25: qty 2
  Filled 2020-02-25 (×13): qty 1

## 2020-02-25 MED ORDER — METOPROLOL SUCCINATE ER 50 MG PO TB24
50.0000 mg | ORAL_TABLET | Freq: Every day | ORAL | Status: DC
  Administered 2020-02-25 – 2020-02-26 (×2): 50 mg via ORAL
  Filled 2020-02-25 (×2): qty 1

## 2020-02-25 MED ORDER — HYDROCODONE-ACETAMINOPHEN 7.5-325 MG PO TABS
1.0000 | ORAL_TABLET | Freq: Four times a day (QID) | ORAL | Status: DC | PRN
  Administered 2020-02-25 – 2020-02-28 (×6): 1 via ORAL
  Filled 2020-02-25 (×6): qty 1

## 2020-02-25 MED ORDER — LACTATED RINGERS IV SOLN: INTRAVENOUS | Status: DC

## 2020-02-25 NOTE — Evaluation (Addendum)
Physical Therapy Evaluation Patient Details Name: Morgan Beasley MRN: [id number] DOB: [birth year] Today's Date: 02/25/2020   History of Present Illness  Pt is a 59 y.o. female admitted 02/23/20 with LLE pain; lab work consistent with sepsis. Also with LLE cellulitis. PMH includes CHF, CKD III, COPD, DM, neuropathy, HTN, CAD, OSA, PAD, tobacco use, alcohol dependence, anxiety, gout, breast CA, R foot ulcer, LLE vascular intervention in 2021 (most recently 01/26/20).    Clinical Impression  Pt presents with an overall decrease in functional mobility secondary to above. Pt poor historian and unable to provide PLOF or home set-up; per chart review from PT Eval 08/2019, was living with daughter and mod indep with rollator. Today, pt requiring max-totalA+2 for bed mobility. Pt limited by generalized weakness, LLE pain, decreased activity tolerance and impaired cognition. Pt pleasantly confused, only oriented to self with difficulty following one-step commands; lethargic, requiring frequent cues to keep eyes open. Pt would benefit from continued acute PT services to maximize functional mobility and independence prior to d/c with SNF-level therapies.   SpO2 100% on RA  Orthostatic BPs Supine 91/68  Sitting 80/65  Sitting after 2 min Would not read  Return to supine 95/68       Follow Up Recommendations SNF;Supervision/Assistance - 24 hour    Equipment Recommendations   (defer)    Recommendations for Other Services       Precautions / Restrictions Precautions Precautions: Fall;Other (comment) Precaution Comments: Hypotension Restrictions Weight Bearing Restrictions: No      Mobility  Bed Mobility Overal bed mobility: Needs Assistance Bed Mobility: Supine to Sit;Sit to Supine;Rolling Rolling: Max assist;Min assist   Supine to sit: Max assist;+2 for physical assistance;+2 for safety/equipment Sit to supine: Total assist;+2 for safety/equipment;+2 for physical assistance    General bed mobility comments: max assist +2 to transition to EOB using pads, pt assisting minimally by initating LE movement and reaching towards bed rail; voiced wanting to return to supine but no intiation to complete requiring total assist +2. Additional rolling R/L for pad placement, initial maxA to initiate and cue pt to hold bed rails; pt performing additional roll onto R-side with minA    Transfers                 General transfer comment: Deferred secondary to lethargy, hypotension and request for return to supine; pt also stating "I'm going to be sick"  Ambulation/Gait                Stairs            Wheelchair Mobility    Modified Rankin (Stroke Patients Only)       Balance Overall balance assessment: Needs assistance Sitting-balance support: No upper extremity supported;Feet supported Sitting balance-Leahy Scale: Poor Sitting balance - Comments: Initial modA to maintain sitting balance, progressing to min guard                                     Pertinent Vitals/Pain Pain Assessment: Faces Faces Pain Scale: Hurts little more Pain Location: LLE Pain Descriptors / Indicators: Discomfort;Grimacing;Moaning Pain Intervention(s): Monitored during session;Repositioned    Home Living Family/patient expects to be discharged to:: Private residence Living Arrangements: Alone Available Help at Discharge: Family Type of Home: Apartment Home Access: Stairs to enter   Technical brewer of Steps: 1 Home Layout: One level Home Equipment: Environmental consultant - 4 wheels;Bedside commode Additional Comments: Pt poor  historian - info taken from chart review from PT Eval 08/2019    Prior Function Level of Independence: Needs assistance   Gait / Transfers Assistance Needed: uses rollator for mobility   ADL's / Homemaking Assistance Needed: Likely requires assist from daughters for ADLs/IADLs (per note 08/2019, required assist from  daughter)  Comments: Pt poor historian - info taken from chart review from PT Eval 08/2019     Hand Dominance   Dominant Hand: Right    Extremity/Trunk Assessment   Upper Extremity Assessment Upper Extremity Assessment: Generalized weakness;Difficult to assess due to impaired cognition    Lower Extremity Assessment Lower Extremity Assessment: Generalized weakness;Difficult to assess due to impaired cognition (chronic wounds to BLEs)    Cervical / Trunk Assessment Cervical / Trunk Assessment: Kyphotic  Communication   Communication: Expressive difficulties (mumbled speech)  Cognition Arousal/Alertness: Lethargic Behavior During Therapy: Flat affect Overall Cognitive Status: No family/caregiver present to determine baseline cognitive functioning Area of Impairment: Orientation;Following commands;Problem solving;Awareness;Memory;Attention;Safety/judgement                 Orientation Level: Disoriented to;Situation;Time;Place Current Attention Level: Focused Memory: Decreased recall of precautions;Decreased short-term memory Following Commands: Follows one step commands inconsistently;Follows one step commands with increased time Safety/Judgement: Decreased awareness of safety;Decreased awareness of deficits Awareness: Intellectual Problem Solving: Slow processing;Decreased initiation;Difficulty sequencing;Requires verbal cues;Requires tactile cues General Comments: pt oriented to self only, following minimal 1 step commands but requires cueing to attend to tasks, poor awareness of deficits and situation; pleasantly confused --able to choose "fall" for season but then reports January       General Comments General comments (skin integrity, edema, etc.): Pt initially with SpO2 100% on 1L O2 [place], removed and maintaining 95-100% on RA. Supine BP 91/68, sitting EOB BP 80/65 (attempted sitting ~2-min but unable to get reading), return to supine 95/68    Exercises      Assessment/Plan    PT Assessment Patient needs continued PT services  PT Problem List Decreased strength;Decreased activity tolerance;Decreased balance;Decreased mobility;Decreased cognition;Decreased knowledge of use of DME;Decreased safety awareness;Cardiopulmonary status limiting activity;Pain       PT Treatment Interventions DME instruction;Gait training;Stair training;Functional mobility training;Therapeutic activities;Balance training;Therapeutic exercise;Patient/family education    PT Goals (Current goals can be found in the Care Plan section)  Acute Rehab PT Goals Patient Stated Goal: to lay back down  PT Goal Formulation: With patient Time For Goal Achievement: 03/10/20 Potential to Achieve Goals: Fair    Frequency Min 2X/week   Barriers to discharge        Co-evaluation PT/OT/SLP Co-Evaluation/Treatment: Yes Reason for Co-Treatment: Necessary to address cognition/behavior during functional activity;For patient/therapist safety;To address functional/ADL transfers PT goals addressed during session: Mobility/safety with mobility;Balance OT goals addressed during session: ADL's and self-care       AM-PAC PT "6 Clicks" Mobility  Outcome Measure Help needed turning from your back to your side while in a flat bed without using bedrails?: A Lot Help needed moving from lying on your back to sitting on the side of a flat bed without using bedrails?: Total Help needed moving to and from a bed to a chair (including a wheelchair)?: Total Help needed standing up from a chair using your arms (e.g., wheelchair or bedside chair)?: Total Help needed to walk in hospital room?: Total Help needed climbing 3-5 steps with a railing? : Total 6 Click Score: 7    End of Session   Activity Tolerance: Patient limited by lethargy Patient left: in bed;with call  bell/phone within reach;with bed alarm set Nurse Communication: Mobility status PT Visit Diagnosis: Other abnormalities of gait  and mobility (R26.89);Pain;Muscle weakness (generalized) (M62.81) Pain - Right/Left: Left Pain - part of body: Leg;Ankle and joints of foot    Time: 1352-1418 PT Time Calculation (min) (ACUTE ONLY): 26 min   Charges:   PT Evaluation $PT Eval Moderate Complexity: 1 Mod     Mabeline Caras, PT, DPT Acute Rehabilitation Services  Pager [phone] Office [place] 02/25/2020, 3:19 PM

## 2020-02-25 NOTE — Progress Notes (Signed)
Paged Hospitalist for hypotensive BP reading and lethargy.

## 2020-02-25 NOTE — Progress Notes (Signed)
Patient did well overnight. Spoke with hospitalist concerning updated lactic acid levels. No new orders were made other than to call if hypotensive. Lactic acid has significantly decreased. Patients blood pressure remained normal, no new complaints or acute issues at this time.

## 2020-02-25 NOTE — Progress Notes (Addendum)
Inpatient Diabetes Program Recommendations  AACE/ADA: New Consensus Statement on Inpatient Glycemic Control (2015)  Target Ranges:  Prepandial:   less than 140 mg/dL      Peak postprandial:   less than 180 mg/dL (1-2 hours)      Critically ill patients:  140 - 180 mg/dL   Results for Morgan Beasley, Morgan Beasley (MRN [id number]) as of 02/25/2020 10:03  Ref. Range 02/24/2020 07:47 02/24/2020 12:57 02/24/2020 16:16 02/24/2020 21:10  Glucose-Capillary Latest Ref Range: 70 - 99 mg/dL 87 109 (H) 87 195 (H)  3 units NOVOLOG +  45 units LANTUS   Results for Morgan Beasley, Morgan Beasley (MRN [id number]) as of 02/25/2020 10:03  Ref. Range 02/25/2020 07:05  Glucose-Capillary Latest Ref Range: 70 - 99 mg/dL 110 (H)   Results for Morgan Beasley, Morgan Beasley (MRN [id number]) as of 02/25/2020 10:03  Ref. Range 09/22/2019 05:03 02/23/2020 14:53  Hemoglobin A1C Latest Ref Range: 4.8 - 5.6 % 11.7 (H) 15.1 (H)  (387 mg/dl)     Admit with LLE foot Wound/ Sepsis/ Elevated Lactic Acid.   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), Diastolic CHF, CKD   Home DM Meds: Lantus 80 units Daily                              Humalog 14 units TID                              Amaryl 8 mg Daily                              Farxiga 10 mg Daily  Current Orders: Lantus 30 units QHS      Novolog Moderate Correction Scale/ SSI (0-15 units) TID AC + HS    PCP listed as Andy Gauss, NP [facility])  Current A1c of 15.1% shows extremely poor glucose control at home--Needs follow up with PCP  Note that Lantus dose reduced for tonight    Addendum 11:45am--Met with pt at bedside to discuss current A1c of 15.1%.  Pt A&O and able to have meantingful conversation.  Pt was very surprised to hear her A1c was so high.  It was very difficult to understand pt as she had garbly speech (RN taking care of pt told me pt often has garbly speech after waking from a nap or period of sleep).  Verified home meds with pt (see  above) and verified PCP as Andy Gauss, NP [facility]).  Pt told me she last saw PCP at the end of Sept.  Pt told me she has all her insulin and supplies at home.  Pt states she takes her insulin without help as she has been taking insulin by herself for a long time.  States she does not miss doses.  Was unable to understand pt when she was telling me how often she checks her CBGs.  Lives with her daughter at home.  Discussed with pt the extreme importance of maintaining well controlled CBGs here in the hospital and at home given her LE wound.  Discussed with pt that her CBGs in the hospital have been well controlled and that we need to continue this control at home for her to allow for healing and prevention of further complications.  Pt stated understanding and was appreciative of visit. Attempted to call pt's daughter listed  in chart to provide diabetes update however, could not reach daughter by phone.     --Will follow patient during hospitalization--  Wyn Quaker RN, MSN, CDE Diabetes Coordinator Inpatient Glycemic Control Team Team Pager: [phone] (8a-5p)

## 2020-02-25 NOTE — Progress Notes (Signed)
Patient was placed on BIPAP per MD order. MD states that he would like patient to wear BIPAP at night & during naps. Patient was placed on 12/6 with 2L O2 bled in via full face mask (what patient states she wears at home). Patient is tolerating BIPAP well at this time. RN aware that patient was placed on BIPAP.

## 2020-02-25 NOTE — Progress Notes (Signed)
Lower extremity venous LT study completed.  Preliminary results relayed to RN.   See CV Proc for preliminary results report.   Darlin Coco, RDMS

## 2020-02-25 NOTE — Progress Notes (Signed)
Pharmacy IV to PO Conversion    This patient is receiving the antibiotic(s) metronidazole  by the intravenous route. Based on criteria approved by the Pharmacy and Therapeutics Committee, and the Infectious Disease Division, the antibiotic(s) is / are being converted to equivalent oral dose form(s). These criteria include:   . Patient being treated for a respiratory tract infection, urinary tract infection, cellulitis, or Clostridium Difficile Associated Diarrhea  .   The patient is not neutropenic and does not exhibit a GI malabsorption state  .   The patient is eating (either orally or per tube) and/or has been taking other orally administered medications for at least 24 hours. .   The patient is improving clinically (physician assessment and a 24-hour Tmax of ? 100.5? F).   Jimmy Footman, PharmD, BCPS, [place] Infectious Diseases Clinical Pharmacist Phone: [phone] 02/25/2020 10:25 AM

## 2020-02-25 NOTE — Progress Notes (Addendum)
PROGRESS NOTE                                                                                                                                                                                                             Patient Demographics:    Morgan Beasley, is a 59 y.o. female, DOB - 06-Sep-1960, [id number]  Outpatient Primary MD for the patient is Cullop, [name], NP    LOS - 2  Admit date - 02/23/2020    Chief Complaint  Patient presents with  . Wound Infection       Brief Narrative (HPI from H&P)  - 59 year old female, lives alone but her daughter checks on her regularly, ambulates with the help of a walker with seat, PMH of chronic combined systolic and diastolic CHF, ongoing tobacco abuse, stage II CKD, COPD not on home oxygen, OSA on nightly BiPAP, poorly controlled DM2 with peripheral neuropathy, gout, HLD, HTN, prior substance abuse (THC, cocaine), PAD s/p L SFA stenting 08/2019 and left femoral to below-knee popliteal bypass 09/2019 with nonhealing left foot ulcer, mild CAD presented initially to the [facility] ED on 10/25 due to leg and foot pain.  Admitted for sepsis/Serratia bacteremia, left lower extremity cellulitis complicating chronic nonhealing ulcer, persistent lactic acidosis and concern for critical left lower extremity ischemia.  Vascular surgery was consulted and she was transferred from [facility] hospital to [facility] under my care on 02/25/2020.   Subjective:    Morgan Beasley today has, No headache, No chest pain, No abdominal pain - No Nausea, No new weakness tingling or numbness, no shortness of breath but does have chronic bilateral leg pain.   Assessment  & Plan :    1.  Septic shock caused by Serratia bacteremia due to bilateral lower extremity PAD with chronic left lower extremity nonhealing ulcer in a diabetic patient.  She has been treated with IV fluids along with IV cefepime and  oral Flagyl.  Vascular surgery following, sepsis pathophysiology seems to have improved, continue supportive care, be cautious with narcotic use as patient is slightly somnolent, continue gentle hydration.  Defer further management of PAD issues to vascular surgery.  2.  PAD s/p left femoropopliteal bypass in 10/19/2019, CAD.  On Plavix and statin for secondary prevention further assess #1 above.  3.   Incidental finding of reactive  mediastinal hilar and right supraclavicular adenopathy.  Request PCP to arrange for outpatient biopsy.  4.  Dyslipidemia.  On statin and Zetia.  5.  Chronic narcotic use.  Mildly somnolent, Narcan as needed, reduce narcotics and use with caution, patient and daughter updated on 02/25/2020.  6.  COPD and smoking.  Counseled to quit smoking, supportive care, no wheezing.  7.  Obesity with OSA.  BMI of 32, follow with PCP, use BiPAP whenever she sleeps day or night.  High risk for CO2 retention.  8.  Essential hypertension.  Beta-blocker.  9.  Tonic combined systolic and diastolic CHF.  Echo done few months ago showing a EF of 40% with global hypokinesis.  Continue Plavix, statin and beta-blocker for secondary prevention.  10.  AKI with CKD stage IIIa.  Likely worse due to dehydration, sepsis along with possible IV contrast, hydrate with IV fluids.  Baseline creatinine around 1.4.  11.  Anemia of chronic disease.  Mild drop due to heme dilution.  Monitor.  12. Poor IV access - PICC needed for IV  meds and ABX, CKD 3 A - benefits outweigh the risks.  6.  DM 2, poor outpatient control due to hyperglycemia.  With A1c of 15, on Lantus and sliding scale monitor and adjust.  Lab Results  Component Value Date   HGBA1C 15.1 (H) 02/23/2020   CBG (last 3)  Recent Labs    02/24/20 1616 02/24/20 2110 02/25/20 0705  GLUCAP 87 195* 110*       Condition - Extremely Guarded  Family Communication  [name] - [phone] - on 02/25/20  Code Status :   Full  Consults  :  VVS - Dr. Oneida Alar  Procedures  :    Leg [place]  CTA -  IMPRESSION: 1. Patent left lower extremity femoral to below knee popliteal artery bypass graft. Evaluation of the left tibial vasculature is limited by calcifications, however there appears to be a 2 vessel runoff to the left ankle via the anterior tibial and posterior tibial arteries. 2. Progressive peripheral vascular disease involving the right lower extremity. There is now complete occlusion of the mid to distal right superficial femoral artery, new since prior study. There are now appears to be a single vessel runoff to the right foot via the anterior tibial artery. In June, there was a 3 vessel runoff to the right foot. 3. Nonspecific, asymmetric bilateral lower extremity edema (left worse than right). This is of unknown clinical significance. This study cannot adequately exclude a DVT. If there is clinical suspicion for a DVT, follow-up with ultrasound is recommended. 4. No evidence for an acute pulmonary embolism. No evidence for thoracic aortic dissection or aneurysm. 5. Cardiomegaly with small to moderate-sized bilateral pleural effusions, right worse than left. 6. Persistent mediastinal and hilar adenopathy with worsening right supraclavicular adenopathy. Findings may be reactive or due to a lymphoproliferative disorder or metastatic disease. Follow-up is recommended. Of note, the right supraclavicular lymph node is amenable to percutaneous ultrasound-guided biopsy if clinically indicated. 7. Hepatomegaly with likely underlying hepatic steatosis. 8.  Aortic Atherosclerosis   PUD Prophylaxis : PPI  Disposition Plan  :    Status is: Inpatient  Remains inpatient appropriate because:Inpatient level of care appropriate due to severity of illness   Dispo: The patient is from: Home              Anticipated d/c is to: Home              Anticipated  d/c date is: > 3 days              Patient currently is medically stable to  d/c.   DVT Prophylaxis  :  Heparin    Lab Results  Component Value Date   PLT 322 02/25/2020    Diet :  Diet Order            DIET SOFT Room service appropriate? Yes; Fluid consistency: Thin  Diet effective now                  Inpatient Medications  Scheduled Meds: . amitriptyline  200 mg Oral QHS  . clopidogrel  75 mg Oral Daily  . ezetimibe  10 mg Oral QPC supper  . fluticasone furoate-vilanterol  1 puff Inhalation Daily  . gabapentin  800 mg Oral TID  . insulin aspart  0-15 Units Subcutaneous TID WC  . insulin aspart  0-5 Units Subcutaneous QHS  . insulin glargine  30 Units Subcutaneous QHS  . lipase/protease/amylase  72,000 Units Oral TID WC  . metoprolol succinate  50 mg Oral Daily  . naLOXone (NARCAN)  injection  0.1 mg Intravenous Once  . rosuvastatin  40 mg Oral Daily  . sodium chloride flush  3 mL Intravenous Q12H   Continuous Infusions: . ceFEPime (MAXIPIME) IV 2 g (02/25/20 0538)  . lactated ringers 75 mL/hr at 02/25/20 0852  . metronidazole 500 mg (02/25/20 0921)   PRN Meds:.acetaminophen **OR** [DISCONTINUED] acetaminophen, dextrose, lipase/protease/amylase, naloxone, [DISCONTINUED] ondansetron **OR** ondansetron (ZOFRAN) IV, polyethylene glycol  Antibiotics  :    Anti-infectives (From admission, onward)   Start     Dose/Rate Route Frequency Ordered Stop   02/24/20 1800  ceFEPIme (MAXIPIME) 2 g in sodium chloride 0.9 % 100 mL IVPB        2 g 200 mL/hr over 30 Minutes Intravenous Every 12 hours 02/24/20 0732     02/24/20 0600  ceFEPIme (MAXIPIME) 2 g in sodium chloride 0.9 % 100 mL IVPB  Status:  Discontinued        2 g 200 mL/hr over 30 Minutes Intravenous Every 8 hours 02/24/20 0440 02/24/20 0732   02/23/20 1015  vancomycin (VANCOREADY) IVPB 1750 mg/350 mL        1,750 mg 175 mL/hr over 120 Minutes Intravenous  Once 02/23/20 1007 02/23/20 1347   02/23/20 1000  cefTRIAXone (ROCEPHIN) 2 g in sodium chloride 0.9 % 100 mL IVPB  Status:  Discontinued         2 g 200 mL/hr over 30 Minutes Intravenous Every 24 hours 02/23/20 0952 02/24/20 0440   02/23/20 1000  metroNIDAZOLE (FLAGYL) IVPB 500 mg        500 mg 100 mL/hr over 60 Minutes Intravenous Every 8 hours 02/23/20 9892         Time Spent in minutes  30   Lala Lund M.D on 02/25/2020 at 9:40 AM  To page go to [email or web address] - password [facility]  [facility] -  Office  [phone]    See all Orders from today for further details    Objective:   Vitals:   02/24/20 2313 02/25/20 0312 02/25/20 0706 02/25/20 0847  BP: (!) 119/96 94/78 (!) 96/42   Pulse: (!) 103 96 98 (!) 105  Resp: 18 20 (!) 25 (!) 24  Temp: 98.5 F (36.9 C) 98.5 F (36.9 C) 97.6 F (36.4 C)   TempSrc: Oral Oral Oral   SpO2: 96% 98% 100% 94%  Weight:  Height:        Wt Readings from Last 3 Encounters:  02/23/20 90.3 kg  01/26/20 90.7 kg  01/16/20 93.9 kg     Intake/Output Summary (Last 24 hours) at 02/25/2020 0940 Last data filed at 02/25/2020 0853 Gross per 24 hour  Intake 3 ml  Output --  Net 3 ml     Physical Exam  Somnolent but able to answer questions and follow basic commands, in no distress, [place].AT,PERRAL Supple Neck,No JVD, No cervical lymphadenopathy appriciated.  Symmetrical Chest wall movement, Good air movement bilaterally, CTAB RRR,No Gallops,Rubs or new Murmurs, No Parasternal Heave +ve B.Sounds, Abd Soft, No tenderness, No organomegaly appriciated, No rebound - guarding or rigidity. No Cyanosis, lower extremity wounds as below.          Data Review:    CBC Recent Labs  Lab 02/23/20 0952 02/24/20 0355 02/25/20 0254  WBC 11.4* 11.3* 7.3  HGB 12.4 9.9* 9.7*  HCT 40.4 32.7* 32.0*  PLT 419* 313 322  MCV 82.1 82.4 82.7  MCH 25.2* 24.9* 25.1*  MCHC 30.7 30.3 30.3  RDW 18.8* 18.4* 18.7*  LYMPHSABS 1.4  --   --   MONOABS 0.5  --   --   EOSABS 0.1  --   --   BASOSABS 0.0  --   --     Recent Labs  Lab 02/23/20 0952 02/23/20 0952  02/23/20 1149 02/23/20 1152 02/23/20 1432 02/23/20 1453 02/23/20 1934 02/24/20 0355 02/24/20 1901 02/25/20 0254  NA 133*  --   --   --   --   --   --  136  --  134*  K 3.7  --   --   --   --   --   --  3.2*  --  4.1  CL 97*  --   --   --   --   --   --  104  --  104  CO2 26  --   --   --   --   --   --  24  --  21*  GLUCOSE 439*  --   --   --   --   --   --  117*  --  136*  BUN 16  --   --   --   --   --   --  14  --  22*  CREATININE 1.17*  --   --   --   --   --   --  1.38*  --  1.80*  CALCIUM 8.3*  --   --   --   --   --   --  7.4*  --  7.6*  AST 18  --   --   --   --   --   --   --   --   --   ALT 18  --   --   --   --   --   --   --   --   --   ALKPHOS 158*  --   --   --   --   --   --   --   --   --   BILITOT 0.6  --   --   --   --   --   --   --   --   --   ALBUMIN 2.6*  --   --   --   --   --   --   --   --   --  PROCALCITON  --   --  <0.10  --   --   --   --  0.67  --  0.77  LATICACIDVEN 2.6*   < >  --  >11.0* 3.6*  --  >11.0*  --  2.1* 0.9  INR 1.2  --   --   --   --   --   --  1.4*  --   --   HGBA1C  --   --   --   --   --  15.1*  --   --   --   --   BNP 868.5*  --   --   --   --   --   --   --   --   --    < > = values in this interval not displayed.    ------------------------------------------------------------------------------------------------------------------ No results for input(s): CHOL, HDL, LDLCALC, TRIG, CHOLHDL, LDLDIRECT in the last 72 hours.  Lab Results  Component Value Date   HGBA1C 15.1 (H) 02/23/2020   ------------------------------------------------------------------------------------------------------------------ No results for input(s): TSH, T4TOTAL, T3FREE, THYROIDAB in the last 72 hours.  Invalid input(s): FREET3  Cardiac Enzymes No results for input(s): CKMB, TROPONINI, MYOGLOBIN in the last 168 hours.  Invalid input(s):  CK ------------------------------------------------------------------------------------------------------------------    Component Value Date/Time   BNP 868.5 (H) 02/23/2020 8841    Micro Results Recent Results (from the past 240 hour(s))  Blood Culture (routine x 2)     Status: Abnormal (Preliminary result)   Collection Time: 02/23/20  9:52 AM   Specimen: BLOOD  Result Value Ref Range Status   Specimen Description   Final    BLOOD RIGHT ANTECUBITAL Performed at [facility] [address] [place], [place] [address]    Special Requests   Final    BOTTLES DRAWN AEROBIC AND ANAEROBIC Blood Culture adequate volume Performed at [facility] [address], [place], [place] [address]    Culture  Setup Time   Final    GRAM NEGATIVE RODS IN BOTH AEROBIC AND ANAEROBIC BOTTLES CRITICAL RESULT CALLED TO, READ BACK BY AND VERIFIED WITH: Irwin Brakeman 0257 02/24/2020 T. TYSOR    Culture (A)  Final    SERRATIA MARCESCENS SUSCEPTIBILITIES TO FOLLOW Performed at [facility], [place] [address], [place], [place] [address]    Report Status PENDING  Incomplete  Blood Culture ID Panel (Reflexed)     Status: Abnormal   Collection Time: 02/23/20  9:52 AM  Result Value Ref Range Status   Enterococcus faecalis NOT DETECTED NOT DETECTED Final   Enterococcus Faecium NOT DETECTED NOT DETECTED Final   Listeria monocytogenes NOT DETECTED NOT DETECTED Final   Staphylococcus species NOT DETECTED NOT DETECTED Final   Staphylococcus aureus (BCID) NOT DETECTED NOT DETECTED Final   Staphylococcus epidermidis NOT DETECTED NOT DETECTED Final   Staphylococcus lugdunensis NOT DETECTED NOT DETECTED Final   Streptococcus species NOT DETECTED NOT DETECTED Final   Streptococcus agalactiae NOT DETECTED NOT DETECTED Final   Streptococcus pneumoniae NOT DETECTED NOT DETECTED Final   Streptococcus pyogenes NOT DETECTED NOT DETECTED Final   A.calcoaceticus-baumannii  NOT DETECTED NOT DETECTED Final   Bacteroides fragilis NOT DETECTED NOT DETECTED Final   Enterobacterales DETECTED (A) NOT DETECTED Final    Comment: Enterobacterales represent a large order of gram negative bacteria, not a single organism. CRITICAL RESULT CALLED TO, READ BACK BY AND VERIFIED WITH: M. LILLISTON,PHARMD 0257 02/24/2020 T. TYSOR    Enterobacter cloacae complex NOT  DETECTED NOT DETECTED Final   Escherichia coli NOT DETECTED NOT DETECTED Final   Klebsiella aerogenes NOT DETECTED NOT DETECTED Final   Klebsiella oxytoca NOT DETECTED NOT DETECTED Final   Klebsiella pneumoniae NOT DETECTED NOT DETECTED Final   Proteus species NOT DETECTED NOT DETECTED Final   Salmonella species NOT DETECTED NOT DETECTED Final   Serratia marcescens DETECTED (A) NOT DETECTED Final    Comment: CRITICAL RESULT CALLED TO, READ BACK BY AND VERIFIED WITH: M. LILLISTON,PHARMD 0257 02/24/2020 T. TYSOR    Haemophilus influenzae NOT DETECTED NOT DETECTED Final   Neisseria meningitidis NOT DETECTED NOT DETECTED Final   Pseudomonas aeruginosa NOT DETECTED NOT DETECTED Final   Stenotrophomonas maltophilia NOT DETECTED NOT DETECTED Final   Candida albicans NOT DETECTED NOT DETECTED Final   Candida auris NOT DETECTED NOT DETECTED Final   Candida glabrata NOT DETECTED NOT DETECTED Final   Candida krusei NOT DETECTED NOT DETECTED Final   Candida parapsilosis NOT DETECTED NOT DETECTED Final   Candida tropicalis NOT DETECTED NOT DETECTED Final   Cryptococcus neoformans/gattii NOT DETECTED NOT DETECTED Final   CTX-M ESBL NOT DETECTED NOT DETECTED Final   Carbapenem resistance IMP NOT DETECTED NOT DETECTED Final   Carbapenem resistance KPC NOT DETECTED NOT DETECTED Final   Carbapenem resistance NDM NOT DETECTED NOT DETECTED Final   Carbapenem resist OXA 48 LIKE NOT DETECTED NOT DETECTED Final   Carbapenem resistance VIM NOT DETECTED NOT DETECTED Final    Comment: Performed at [facility], [address] [place], [place] [address]  Respiratory Panel by RT PCR (Flu A&B, Covid) - Nasopharyngeal Swab     Status: None   Collection Time: 02/23/20  9:55 AM   Specimen: Nasopharyngeal Swab  Result Value Ref Range Status   SARS Coronavirus 2 by RT PCR NEGATIVE NEGATIVE Final    Comment: (NOTE) SARS-CoV-2 target nucleic acids are NOT DETECTED.  The SARS-CoV-2 RNA is generally detectable in upper respiratoy specimens during the acute phase of infection. The lowest concentration of SARS-CoV-2 viral copies this assay can detect is 131 copies/mL. A negative result does not preclude SARS-Cov-2 infection and should not be used as the sole basis for treatment or other patient management decisions. A negative result may occur with  improper specimen collection/handling, submission of specimen other than nasopharyngeal swab, presence of viral mutation(s) within the areas targeted by this assay, and inadequate number of viral copies (<131 copies/mL). A negative result must be combined with clinical observations, patient history, and epidemiological information. The expected result is Negative.  Fact Sheet for Patients:  PinkCheek.be  Fact Sheet for Healthcare Providers:  GravelBags.it  This test is no t yet approved or cleared by the [place] FDA and  has been authorized for detection and/or diagnosis of SARS-CoV-2 by FDA under an Emergency Use Authorization (EUA). This EUA will remain  in effect (meaning this test can be used) for the duration of the COVID-19 declaration under Section 564(b)(1) of the Act, 21 U.S.C. section 360bbb-3(b)(1), unless the authorization is terminated or revoked sooner.     Influenza A by PCR NEGATIVE NEGATIVE Final   Influenza B by PCR NEGATIVE NEGATIVE Final    Comment: (NOTE) The Xpert Xpress SARS-CoV-2/FLU/RSV assay is intended as an aid in  the diagnosis of influenza from Nasopharyngeal swab specimens  and  should not be used as a sole basis for treatment. Nasal washings and  aspirates are unacceptable for Xpert Xpress SARS-CoV-2/FLU/RSV  testing.  Fact Sheet for Patients: PinkCheek.be  Fact Sheet for Healthcare Providers: GravelBags.it  This test is not yet approved or cleared by the [place] FDA and  has been authorized for detection and/or diagnosis of SARS-CoV-2 by  FDA under an Emergency Use Authorization (EUA). This EUA will remain  in effect (meaning this test can be used) for the duration of the  Covid-19 declaration under Section 564(b)(1) of the Act, 21  U.S.C. section 360bbb-3(b)(1), unless the authorization is  terminated or revoked. Performed at [facility], [name] [address] [place], [place] [address]   Blood Culture (routine x 2)     Status: None (Preliminary result)   Collection Time: 02/23/20  9:57 AM   Specimen: BLOOD RIGHT FOREARM  Result Value Ref Range Status   Specimen Description   Final    BLOOD RIGHT FOREARM Performed at [facility], [place] [address] [place], [place] [address]    Special Requests   Final    BOTTLES DRAWN AEROBIC AND ANAEROBIC Blood Culture results may not be optimal due to an inadequate volume of blood received in culture bottles Performed at [facility] [address], [place], [place] [address]    Culture   Final    NO GROWTH 2 DAYS Performed at [facility] [address] [place], [place] [address]    Report Status PENDING  Incomplete  Urine culture     Status: Abnormal   Collection Time: 02/23/20 12:09 PM   Specimen: In/Out Cath Urine  Result Value Ref Range Status   Specimen Description   Final    IN/OUT CATH URINE Performed at [facility] [address] [place], [place] [address]    Special Requests   Final    NONE Performed at [facility], [place] [address],  [place], [place] [address]    Culture (A)  Final    <10,000 COLONIES/mL INSIGNIFICANT GROWTH Performed at [facility] [address], [place], [place] [address]    Report Status 02/24/2020 FINAL  Final    Radiology Reports CT ANGIO AO+BIFEM W & OR WO CONTRAST  Result Date: 02/24/2020 CLINICAL DATA:  Chest pain.  Peripheral vascular disease. EXAM: CT ANGIOGRAPHY OF ABDOMINAL AORTA WITH ILIOFEMORAL RUNOFF CT ANGIOGRAPHY OF ABDOMINAL AORTA WITH ILIOFEMORAL RUNOFF TECHNIQUE: Multidetector CT imaging of the abdomen, pelvis and lower extremities was performed using the standard protocol during bolus administration of intravenous contrast. Multiplanar CT image reconstructions and MIPs were obtained to evaluate the vascular anatomy. Multidetector CT imaging of the abdomen, pelvis and lower extremities was performed using the standard protocol during bolus administration of intravenous contrast. Multiplanar CT image reconstructions and MIPs were obtained to evaluate the vascular anatomy. CONTRAST:  151mL OMNIPAQUE IOHEXOL 350 MG/ML SOLN COMPARISON:  CT dated October 09, 2019. CT PE study dated 02/25/2019 FINDINGS: CHEST Cardiovascular: There is no evidence for large centrally located pulmonary embolism. Detection of smaller pulmonary emboli is limited by technique. There is no evidence for a thoracic aortic dissection or aneurysm. The heart size is enlarged. There is no significant pericardial effusion. There are mild coronary artery calcifications. Mediastinum/Nodes: --there are enlarged mediastinal lymph nodes. For example there is a pretracheal lymph node measuring approximately 1.8 x 2.9 cm (axial series 7, image 40). This is relatively similar to prior study. --mild hilar adenopathy is noted. -- No axillary lymphadenopathy. --there is significant right supraclavicular adenopathy which has progressed since the prior study. The dominant lymph node measures 1.2 by 3 point  4 cm (axial series 7, image 15). --  Normal thyroid gland where visualized. -  Unremarkable esophagus. Lungs/Pleura: There is a moderate to large right-sided pleural effusion. There is a small left-sided pleural effusion. There is bibasilar atelectasis. There is no pneumothorax. Musculoskeletal: No chest wall abnormality. No bony spinal canal stenosis. Review of the MIP images confirms the above findings. VASCULAR Aorta: There are atherosclerotic changes of the abdominal aorta without evidence for an aneurysm. Celiac: Patent without evidence of aneurysm, dissection, vasculitis or significant stenosis. SMA: Patent without evidence of aneurysm, dissection, vasculitis or significant stenosis. Renals: Both renal arteries are patent without evidence of aneurysm, dissection, vasculitis, fibromuscular dysplasia or significant stenosis. IMA: Patent without evidence of aneurysm, dissection, vasculitis or significant stenosis. RIGHT Lower Extremity Inflow: Common, internal and external iliac arteries are patent without evidence of aneurysm, dissection, vasculitis or significant stenosis. Outflow: The right common femoral artery is patent. The right SFA is occluded proximally. There is near immediate reconstitution. There are tandem high-grade areas of stenosis throughout the right SFA. There is new complete occlusion of the mid to distal right SFA (axial series 7, image 247). Evaluation of the right popliteal artery is limited, however there appears to be high-grade stenosis throughout the vessel. Runoff: There is a high-grade stenosis of the proximal right anterior tibial artery. There appears to be a single vessel runoff to the right lower extremity via the anterior tibial artery. LEFT Lower Extremity Inflow: Common, internal and external iliac arteries are patent without evidence of aneurysm, dissection, vasculitis or significant stenosis. Outflow: The left CFA is widely patent. The proximal left SFA is entirely occluded. There is a new left femoral to below  knee popliteal artery bypass graft. The graft is widely patent throughout its course. The profunda femoris artery is patent. Runoff: Heavy calcifications limit evaluation of the tibial vasculature. However there appears to be a 2 vessel runoff via the anterior tibial and posterior tibial arteries. The peroneal artery is occluded. Veins: The veins are not well evaluated on this study. Review of the MIP images confirms the above findings. NON-VASCULAR Hepatobiliary: There is hepatomegaly with likely underlying hepatic steatosis. There appears to be gallbladder wall thickening with pericholecystic free fluid.There is no biliary ductal dilation. Pancreas: Normal contours without ductal dilatation. No peripancreatic fluid collection. Spleen: Unremarkable. Adrenals/Urinary Tract: --Adrenal glands: Unremarkable. --Right kidney/ureter: No hydronephrosis or radiopaque kidney stones. --Left kidney/ureter: No hydronephrosis or radiopaque kidney stones. --Urinary bladder: Unremarkable. Stomach/Bowel: --Stomach/Duodenum: No hiatal hernia or other gastric abnormality. Normal duodenal course and caliber. --Small bowel: Unremarkable. --Colon: Unremarkable. --Appendix: Normal. Lymphatic: --No retroperitoneal lymphadenopathy. --No mesenteric lymphadenopathy. --there is mild bilateral inguinal adenopathy, left worse than right. Reproductive: Unremarkable Other: No ascites or free air. The abdominal wall is normal. Musculoskeletal. There are postsurgical changes of the left inguinal region. There is bilateral nonspecific lower extremity edema, left worse than right. There are small bilateral suprapatellar joint effusions. IMPRESSION: 1. Patent left lower extremity femoral to below knee popliteal artery bypass graft. Evaluation of the left tibial vasculature is limited by calcifications, however there appears to be a 2 vessel runoff to the left ankle via the anterior tibial and posterior tibial arteries. 2. Progressive peripheral  vascular disease involving the right lower extremity. There is now complete occlusion of the mid to distal right superficial femoral artery, new since prior study. There are now appears to be a single vessel runoff to the right foot via the anterior tibial artery. In June, there was a 3 vessel runoff to  the right foot. 3. Nonspecific, asymmetric bilateral lower extremity edema (left worse than right). This is of unknown clinical significance. This study cannot adequately exclude a DVT. If there is clinical suspicion for a DVT, follow-up with ultrasound is recommended. 4. No evidence for an acute pulmonary embolism. No evidence for thoracic aortic dissection or aneurysm. 5. Cardiomegaly with small to moderate-sized bilateral pleural effusions, right worse than left. 6. Persistent mediastinal and hilar adenopathy with worsening right supraclavicular adenopathy. Findings may be reactive or due to a lymphoproliferative disorder or metastatic disease. Follow-up is recommended. Of note, the right supraclavicular lymph node is amenable to percutaneous ultrasound-guided biopsy if clinically indicated. 7. Hepatomegaly with likely underlying hepatic steatosis. 8.  Aortic Atherosclerosis (ICD10-I70.0). Electronically Signed   By: Constance Holster M.D.   On: 02/24/2020 18:13   PERIPHERAL VASCULAR CATHETERIZATION  Result Date: 01/26/2020 Patient name: Morgan Beasley MRN: [id number] DOB: 09-14-1960 Sex: female 01/26/2020 Pre-operative Diagnosis: Critical left lower extremity ischemia with wound Post-operative diagnosis:  Same Surgeon:  Erlene Quan C. Donzetta Matters, MD Procedure Performed: 1.  Ultrasound-guided cannulation right common femoral artery 2.  Left lower extremity angiography 3.  Laser atherectomy of left tibioperoneal trunk, posterior tibial artery, anterior tibial artery with 0.9 [place] 4.  Plain balloon angioplasty with 3 mm balloon of left tibioperoneal trunk, posterior tibial artery and anterior tibial arteries 5.  Mynx  device closure right common femoral artery 6.  Moderate sedation with fentanyl and Versed for 62 minutes Indications: 59 year old female with history of a left femoral to popliteal artery bypass with vein.  This was done for critical limb ischemia with wound.  Initially the wound was improving but she did have indication for angiography.  She had obvious tibial outflow disease with spider vein.  The wound is now worsening she has significant pain she is indicated for angiography with possible intervention. Findings: The left lower extremity bypass is patent to the level of the below-knee popliteal artery.  Peroneal artery appears occluded.  Posterior tibial artery has approximately 10 cm long segment high-grade stenosis greater than 70%.  After intervention this is resolved.  There is possibly a small dissection that is nonflow limiting was not intervened upon.  The anterior tibial artery is occluded for approximately 2 cm's.  After intervention there is no further residual stenosis or dissection.  She has very strong signal in the anterior tibial artery that can be traced out to the distal dorsum of the foot.  Procedure:  The patient was identified in the holding area and taken to room 8.  The patient was then placed supine on the table and prepped and draped in the usual sterile fashion.  A time out was called.  Ultrasound was used to evaluate the right common femoral artery which was noted to be patent.  The areas anesthetized 1% lidocaine cannulated with micropuncture needle followed by wire and sheath.  And images saved the permanent record.  Bentson wires placed followed by 5 [place] sheath.  Omni catheter was used across the bifurcation perform left lower extremity angiography.  With the above findings we placed a Rosen wire into the left lower extremity bypass placed a 6 French sheath up and over the bifurcation into the bypass patient was fully heparinized.  We used a V 18 wire to cross on the posterior  tibial artery and confirmed intraluminal access.  We then used a 0.9 laser for arthrectomy and primarily ballooned dilated with 3 mm balloon at nominal pressure for the entirety of the lesion  of the posterior tibial tibioperoneal trunk.  Completion demonstrated no residual stenosis there was possibly 1 area of dissection was nonflow limiting we did not reintervene.  We then directed a V 18 to the anterior tibial artery across the lesion confirmed intraluminal access.  Laser arthrectomy was performed the anterior tibial artery followed by plain balloon angioplasty with 3 mm balloon.  Completion again demonstrated no residual stenosis or dissection.  There was good runoff via the anterior tibial artery onto the foot.  The posterior tibial artery does give out at the ankle.  There was a very strong stream with the intertibial artery that could be traced onto the foot.  Satisfied we exchanged over a wire for a short 6 [place] sheath and deployed a minx device.  She tolerated procedure without any complication. Contrast: 130 cc Brandon C. Donzetta Matters, MD [facility] Office: [phone] Pager: [phone]   DG Chest Port 1 View  Result Date: 02/23/2020 CLINICAL DATA:  Concern for sepsis EXAM: PORTABLE CHEST 1 VIEW COMPARISON:  10/09/2019 FINDINGS: Increased bibasilar airspace opacities worse on the left obscuring the left hemidiaphragm concerning for bibasilar pneumonia. Stable cardiomegaly. No CHF pattern or large effusion. No pneumothorax. Aorta atherosclerotic. IMPRESSION: Bibasilar airspace process, worse on the left concerning for pneumonia. Electronically Signed   By: Jerilynn Mages.  Shick M.D.   On: 02/23/2020 10:24   DG Foot 2 Views Left  Result Date: 02/23/2020 CLINICAL DATA:  Chronic wound EXAM: LEFT FOOT - 2 VIEW COMPARISON:  Sep 21, 2019 FINDINGS: Frontal and lateral views were obtained. There is soft tissue swelling. There is suggestion of soft tissue air medial to the distal aspect of  the first metatarsal. There is no erosive change or osteomyelitis. No fracture or dislocation. Joint spaces overall appear unremarkable. There are prominent posterior and inferior calcaneal spurs. IMPRESSION: Soft tissue swelling with soft tissue air medial to the mid to distal first metatarsal. No bony destruction or erosion. No appreciable joint space narrowing. There are calcaneal spurs. No fracture or dislocation evident. Electronically Signed   By: Lowella Grip III M.D.   On: 02/23/2020 10:22   CT ANGIO CHEST AORTA W/CM &/OR WO/CM  Result Date: 02/24/2020 CLINICAL DATA:  Chest pain.  Peripheral vascular disease. EXAM: CT ANGIOGRAPHY OF ABDOMINAL AORTA WITH ILIOFEMORAL RUNOFF CT ANGIOGRAPHY OF ABDOMINAL AORTA WITH ILIOFEMORAL RUNOFF TECHNIQUE: Multidetector CT imaging of the abdomen, pelvis and lower extremities was performed using the standard protocol during bolus administration of intravenous contrast. Multiplanar CT image reconstructions and MIPs were obtained to evaluate the vascular anatomy. Multidetector CT imaging of the abdomen, pelvis and lower extremities was performed using the standard protocol during bolus administration of intravenous contrast. Multiplanar CT image reconstructions and MIPs were obtained to evaluate the vascular anatomy. CONTRAST:  161mL OMNIPAQUE IOHEXOL 350 MG/ML SOLN COMPARISON:  CT dated October 09, 2019. CT PE study dated 02/25/2019 FINDINGS: CHEST Cardiovascular: There is no evidence for large centrally located pulmonary embolism. Detection of smaller pulmonary emboli is limited by technique. There is no evidence for a thoracic aortic dissection or aneurysm. The heart size is enlarged. There is no significant pericardial effusion. There are mild coronary artery calcifications. Mediastinum/Nodes: --there are enlarged mediastinal lymph nodes. For example there is a pretracheal lymph node measuring approximately 1.8 x 2.9 cm (axial series 7, image 40). This is relatively  similar to prior study. --mild hilar adenopathy is noted. -- No axillary lymphadenopathy. --there is significant right supraclavicular adenopathy which has progressed since the prior study.  The dominant lymph node measures 1.2 by 3 point 4 cm (axial series 7, image 15). -- Normal thyroid gland where visualized. -  Unremarkable esophagus. Lungs/Pleura: There is a moderate to large right-sided pleural effusion. There is a small left-sided pleural effusion. There is bibasilar atelectasis. There is no pneumothorax. Musculoskeletal: No chest wall abnormality. No bony spinal canal stenosis. Review of the MIP images confirms the above findings. VASCULAR Aorta: There are atherosclerotic changes of the abdominal aorta without evidence for an aneurysm. Celiac: Patent without evidence of aneurysm, dissection, vasculitis or significant stenosis. SMA: Patent without evidence of aneurysm, dissection, vasculitis or significant stenosis. Renals: Both renal arteries are patent without evidence of aneurysm, dissection, vasculitis, fibromuscular dysplasia or significant stenosis. IMA: Patent without evidence of aneurysm, dissection, vasculitis or significant stenosis. RIGHT Lower Extremity Inflow: Common, internal and external iliac arteries are patent without evidence of aneurysm, dissection, vasculitis or significant stenosis. Outflow: The right common femoral artery is patent. The right SFA is occluded proximally. There is near immediate reconstitution. There are tandem high-grade areas of stenosis throughout the right SFA. There is new complete occlusion of the mid to distal right SFA (axial series 7, image 247). Evaluation of the right popliteal artery is limited, however there appears to be high-grade stenosis throughout the vessel. Runoff: There is a high-grade stenosis of the proximal right anterior tibial artery. There appears to be a single vessel runoff to the right lower extremity via the anterior tibial artery. LEFT Lower  Extremity Inflow: Common, internal and external iliac arteries are patent without evidence of aneurysm, dissection, vasculitis or significant stenosis. Outflow: The left CFA is widely patent. The proximal left SFA is entirely occluded. There is a new left femoral to below knee popliteal artery bypass graft. The graft is widely patent throughout its course. The profunda femoris artery is patent. Runoff: Heavy calcifications limit evaluation of the tibial vasculature. However there appears to be a 2 vessel runoff via the anterior tibial and posterior tibial arteries. The peroneal artery is occluded. Veins: The veins are not well evaluated on this study. Review of the MIP images confirms the above findings. NON-VASCULAR Hepatobiliary: There is hepatomegaly with likely underlying hepatic steatosis. There appears to be gallbladder wall thickening with pericholecystic free fluid.There is no biliary ductal dilation. Pancreas: Normal contours without ductal dilatation. No peripancreatic fluid collection. Spleen: Unremarkable. Adrenals/Urinary Tract: --Adrenal glands: Unremarkable. --Right kidney/ureter: No hydronephrosis or radiopaque kidney stones. --Left kidney/ureter: No hydronephrosis or radiopaque kidney stones. --Urinary bladder: Unremarkable. Stomach/Bowel: --Stomach/Duodenum: No hiatal hernia or other gastric abnormality. Normal duodenal course and caliber. --Small bowel: Unremarkable. --Colon: Unremarkable. --Appendix: Normal. Lymphatic: --No retroperitoneal lymphadenopathy. --No mesenteric lymphadenopathy. --there is mild bilateral inguinal adenopathy, left worse than right. Reproductive: Unremarkable Other: No ascites or free air. The abdominal wall is normal. Musculoskeletal. There are postsurgical changes of the left inguinal region. There is bilateral nonspecific lower extremity edema, left worse than right. There are small bilateral suprapatellar joint effusions. IMPRESSION: 1. Patent left lower extremity  femoral to below knee popliteal artery bypass graft. Evaluation of the left tibial vasculature is limited by calcifications, however there appears to be a 2 vessel runoff to the left ankle via the anterior tibial and posterior tibial arteries. 2. Progressive peripheral vascular disease involving the right lower extremity. There is now complete occlusion of the mid to distal right superficial femoral artery, new since prior study. There are now appears to be a single vessel runoff to the right foot via the anterior tibial artery. In  June, there was a 3 vessel runoff to the right foot. 3. Nonspecific, asymmetric bilateral lower extremity edema (left worse than right). This is of unknown clinical significance. This study cannot adequately exclude a DVT. If there is clinical suspicion for a DVT, follow-up with ultrasound is recommended. 4. No evidence for an acute pulmonary embolism. No evidence for thoracic aortic dissection or aneurysm. 5. Cardiomegaly with small to moderate-sized bilateral pleural effusions, right worse than left. 6. Persistent mediastinal and hilar adenopathy with worsening right supraclavicular adenopathy. Findings may be reactive or due to a lymphoproliferative disorder or metastatic disease. Follow-up is recommended. Of note, the right supraclavicular lymph node is amenable to percutaneous ultrasound-guided biopsy if clinically indicated. 7. Hepatomegaly with likely underlying hepatic steatosis. 8.  Aortic Atherosclerosis (ICD10-I70.0). Electronically Signed   By: Constance Holster M.D.   On: 02/24/2020 18:13

## 2020-02-25 NOTE — Evaluation (Addendum)
Occupational Therapy Evaluation Patient Details Name: Morgan Beasley MRN: [id number] DOB: [birth year] Today's Date: 02/25/2020    History of Present Illness Pt is a 59 y.o. female admitted 02/23/20 with LLE pain; lab work consistent with sepsis. Also with LLE cellulitis. PMH includes CHF, CKD III, COPD, DM, neuropathy, HTN, CAD, OSA, PAD, tobacco use, alcohol dependence, anxiety, gout, breast CA, R foot ulcer, LLE vascular intervention in 2021 (most recently 01/26/20).   Clinical Impression   Patient admitted for above and limited by problem list below, including L LE pain, impaired balance, generalized weakness, decreased activity tolerance, and impaired cognition. Pt poor historian and unable to provide PLOF or home setup, taken from chart review.  Pt pleasantly confused, oriented to self only, following simple 1 step commands with increased time and fading with fatigue, cueing to attend to task.  She requires max assist +2 to transition to EOB, total assist +2 to return supine, ADLs with min-total assist +2.  Poor tolerance to sitting EOB and fatigues quickly asking to return supine.  Vitals monitored, see below, pt denies dizziness.  She will benefit from further OT services while admitted and after dc at SNF level to optimize independence, safety with ADls, mobility.    BP Supine 91/68 BP seated 80/65, attempted 2nd reading but unable to retrieve   BP supine after activity 95/68  100% SpO2 RA, HR 92   Follow Up Recommendations  SNF;Supervision/Assistance - 24 hour    Equipment Recommendations  Other (comment) (TBD at next venue of care )    Recommendations for Other Services       Precautions / Restrictions Precautions Precautions: Fall Restrictions Weight Bearing Restrictions: No      Mobility Bed Mobility Overal bed mobility: Needs Assistance Bed Mobility: Supine to Sit;Sit to Supine;Rolling Rolling: Max assist   Supine to sit: Max assist;+2 for physical  assistance;+2 for safety/equipment Sit to supine: Total assist;+2 for safety/equipment;+2 for physical assistance   General bed mobility comments: max assist +2 to transition to EOB using pads, pt assisting minimally by initating LE movement and reaching towards bed rail; voiced wanting to return to supine but no intiation to complete requiring total assist +2      Transfers                 General transfer comment: deferred     Balance Overall balance assessment: Needs assistance Sitting-balance support: No upper extremity supported;Feet supported Sitting balance-Leahy Scale: Poor Sitting balance - Comments: mod assist fading to min guard statically                                   ADL either performed or assessed with clinical judgement   ADL Overall ADL's : Needs assistance/impaired     Grooming: Maximal assistance;Sitting;Wash/dry face                                 General ADL Comments: total assist for all self care at this time due to cognition and weakness      Vision   Additional Comments: further assessment required, cueing to maintain eyes open during session     Perception     Praxis      Pertinent Vitals/Pain Pain Assessment: Faces Faces Pain Scale: Hurts little more Pain Location: L LE  Pain Descriptors / Indicators: Discomfort;Grimacing Pain Intervention(s): Limited activity  within patient's tolerance;Monitored during session;Repositioned     Hand Dominance Right   Extremity/Trunk Assessment Upper Extremity Assessment Upper Extremity Assessment: Generalized weakness   Lower Extremity Assessment Lower Extremity Assessment: Defer to PT evaluation       Communication Communication Communication: Expressive difficulties (mumbled speech )   Cognition Arousal/Alertness: Lethargic Behavior During Therapy: Flat affect Overall Cognitive Status: No family/caregiver present to determine baseline cognitive  functioning Area of Impairment: Orientation;Following commands;Problem solving;Awareness;Memory;Attention;Safety/judgement                 Orientation Level: Disoriented to;Situation;Time;Place Current Attention Level: Focused Memory: Decreased recall of precautions;Decreased short-term memory Following Commands: Follows one step commands inconsistently;Follows one step commands with increased time Safety/Judgement: Decreased awareness of safety;Decreased awareness of deficits Awareness: Intellectual Problem Solving: Slow processing;Decreased initiation;Difficulty sequencing;Requires verbal cues;Requires tactile cues General Comments: pt oriented to self only, following minimal 1 step commands but requires cueing to attend to tasks, poor awareness of deficits and situation; pleasantly confused --able to choose "fall" for season but then reports January    General Comments  vital monitored during session- initally on 1L O2, removed to RA a nd SpO2 remains at 100% --BP supine 91/68 and seated 80/65 (attempted prolonged sitting BP but unable to get reading), returned supine 95/68    Exercises     Shoulder Instructions      Home Living Family/patient expects to be discharged to:: Private residence Living Arrangements: Alone Available Help at Discharge: Family Type of Home: Apartment Home Access: Stairs to enter Technical brewer of Steps: 1   Home Layout: One level     Bathroom Shower/Tub: Teacher, early years/pre: Standard     Home Equipment: Environmental consultant - 4 wheels;Bedside commode   Additional Comments: home setup from chart review, pt poor historian       Prior Functioning/Environment Level of Independence: Needs assistance  Gait / Transfers Assistance Needed: uses rollator for mobility  ADL's / Homemaking Assistance Needed: daughter assists with ADLs some    Comments: info from chart review, pt poor historian and unable to provide         OT Problem  List: Decreased strength;Decreased activity tolerance;Impaired balance (sitting and/or standing);Decreased coordination;Decreased cognition;Decreased safety awareness;Decreased knowledge of use of DME or AE;Decreased knowledge of precautions;Cardiopulmonary status limiting activity;Obesity;Pain      OT Treatment/Interventions: Self-care/ADL training;Energy conservation;DME and/or AE instruction;Therapeutic exercise;Therapeutic activities;Cognitive remediation/compensation;Patient/family education;Balance training    OT Goals(Current goals can be found in the care plan section) Acute Rehab OT Goals Patient Stated Goal: to lay back down  OT Goal Formulation: With patient Time For Goal Achievement: 03/10/20 Potential to Achieve Goals: Fair  OT Frequency: Min 2X/week   Barriers to D/C:            Co-evaluation PT/OT/SLP Co-Evaluation/Treatment: Yes Reason for Co-Treatment: Necessary to address cognition/behavior during functional activity;For patient/therapist safety;To address functional/ADL transfers   OT goals addressed during session: ADL's and self-care      AM-PAC OT "6 Clicks" Daily Activity     Outcome Measure Help from another person eating meals?: A Lot Help from another person taking care of personal grooming?: A Lot Help from another person toileting, which includes using toliet, bedpan, or urinal?: Total Help from another person bathing (including washing, rinsing, drying)?: Total Help from another person to put on and taking off regular upper body clothing?: Total Help from another person to put on and taking off regular lower body clothing?: Total 6 Click Score: 8   End  of Session Nurse Communication: Mobility status  Activity Tolerance: Patient limited by fatigue Patient left: in bed;with bed alarm set;with call bell/phone within reach  OT Visit Diagnosis: Other abnormalities of gait and mobility (R26.89);Muscle weakness (generalized) (M62.81);Pain;Other symptoms  and signs involving cognitive function Pain - Right/Left: Left Pain - part of body: Leg;Ankle and joints of foot                Time: 1351-1417 OT Time Calculation (min): 26 min Charges:  OT General Charges $OT Visit: 1 Visit OT Evaluation $OT Eval Moderate Complexity: 1 Mod  Morgan Beasley, OT Acute Rehabilitation Services Pager [phone] Office [phone]   Morgan Beasley 02/25/2020, 2:33 PM

## 2020-02-25 NOTE — Progress Notes (Addendum)
[facility]  Subjective  - leg still hurts   Objective (!) 96/42 98 97.6 F (36.4 C) (Oral) (!) 25 100%  Intake/Output Summary (Last 24 hours) at 02/25/2020 0748 Last data filed at 02/24/2020 0749 Gross per 24 hour  Intake 463.33 ml  Output --  Net 463.33 ml   Left leg cellulitis edema wound essentially unchanged  CTA shows patent vessels and adequate flow to left foot for healing.  No abscess.  Assessment/Planning: 1. DVT US for eval of leg swelling  2.  Mediastinal and right supraclavicular lymphadenopathy per primary team  3. Local wound care left foot would do santyl once daily  4.  Rising creatinine probably some ATN IV hydration encourage PO intake   Ruta Hinds 02/25/2020 7:48 AM --   Addendum: DVT US left leg negative  Ruta Hinds, MD [facility] Office: [phone]  Laboratory Lab Results: Recent Labs    02/24/20 0355 02/25/20 0254  WBC 11.3* 7.3  HGB 9.9* 9.7*  HCT 32.7* 32.0*  PLT 313 322   BMET Recent Labs    02/24/20 0355 02/25/20 0254  NA 136 134*  K 3.2* 4.1  CL 104 104  CO2 24 21*  GLUCOSE 117* 136*  BUN 14 22*  CREATININE 1.38* 1.80*  CALCIUM 7.4* 7.6*    COAG Lab Results  Component Value Date   INR 1.4 (H) 02/24/2020   INR 1.2 02/23/2020   No results found for: PTT

## 2020-02-25 NOTE — Progress Notes (Signed)
Per Dr. Candiss Norse it is ok to place PICC line with CRCL 38 via secure chat.

## 2020-02-25 NOTE — Progress Notes (Signed)
Peripherally Inserted Central Catheter Placement  The IV Nurse has discussed with the patient and/or persons authorized to consent for the patient, the purpose of this procedure and the potential benefits and risks involved with this procedure.  The benefits include less needle sticks, lab draws from the catheter, and the patient may be discharged home with the catheter. Risks include, but not limited to, infection, bleeding, blood clot (thrombus formation), and puncture of an artery; nerve damage and irregular heartbeat and possibility to perform a PICC exchange if needed/ordered by physician.  Alternatives to this procedure were also discussed.  Bard Power PICC patient education guide, fact sheet on infection prevention and patient information card has been provided to patient /or left at bedside.    PICC Placement Documentation  PICC Double Lumen 02/25/20 PICC Left Brachial 42 cm 0 cm (Active)  Indication for Insertion or Continuance of Line Poor Vasculature-patient has had multiple peripheral attempts or PIVs lasting less than 24 hours 02/25/20 1900  Exposed Catheter (cm) 0 cm 02/25/20 1900  Site Assessment Dry;Clean;Intact 02/25/20 1900  Lumen #1 Status Flushed;Saline locked;Blood return noted 02/25/20 1900  Lumen #2 Status Flushed;Saline locked;Blood return noted 02/25/20 1900  Dressing Type Gauze;Securing device 02/25/20 1900  Dressing Status Clean;Dry;Intact 02/25/20 1900  Antimicrobial disc in place? Yes 02/25/20 1900  Line Care Connections checked and tightened 02/25/20 1900  Dressing Change Due 03/03/20 02/25/20 1900       Darlyn Read 02/25/2020, 7:15 PM

## 2020-02-25 NOTE — Progress Notes (Deleted)
Patient did well overnight. Spoke with hospitalist concerning updated lactic acid levels. No new orders were made other than to call if hypotensive. Patients blood pressure remained normal, no new complaints or acute issues at this time.

## 2020-02-26 ENCOUNTER — Inpatient Hospital Stay (HOSPITAL_COMMUNITY): Payer: Medicare Other

## 2020-02-26 DIAGNOSIS — R6521 Severe sepsis with septic shock: Secondary | ICD-10-CM | POA: Diagnosis not present

## 2020-02-26 DIAGNOSIS — A419 Sepsis, unspecified organism: Secondary | ICD-10-CM | POA: Diagnosis not present

## 2020-02-26 LAB — OSMOLALITY, URINE: Osmolality, Ur: 440 mOsm/kg (ref 300–900)

## 2020-02-26 LAB — CBC WITH DIFFERENTIAL/PLATELET
Abs Immature Granulocytes: 0.04 10*3/uL (ref 0.00–0.07)
Basophils Absolute: 0 10*3/uL (ref 0.0–0.1)
Basophils Relative: 0 %
Eosinophils Absolute: 0.1 10*3/uL (ref 0.0–0.5)
Eosinophils Relative: 1 %
HCT: 34.7 % — ABNORMAL LOW (ref 36.0–46.0)
Hemoglobin: 10.5 g/dL — ABNORMAL LOW (ref 12.0–15.0)
Immature Granulocytes: 1 %
Lymphocytes Relative: 18 %
Lymphs Abs: 1.4 10*3/uL (ref 0.7–4.0)
MCH: 25.2 pg — ABNORMAL LOW (ref 26.0–34.0)
MCHC: 30.3 g/dL (ref 30.0–36.0)
MCV: 83.2 fL (ref 80.0–100.0)
Monocytes Absolute: 0.7 10*3/uL (ref 0.1–1.0)
Monocytes Relative: 8 %
Neutro Abs: 5.8 10*3/uL (ref 1.7–7.7)
Neutrophils Relative %: 72 %
Platelets: 357 10*3/uL (ref 150–400)
RBC: 4.17 MIL/uL (ref 3.87–5.11)
RDW: 18.6 % — ABNORMAL HIGH (ref 11.5–15.5)
WBC: 8 10*3/uL (ref 4.0–10.5)
nRBC: 0.9 % — ABNORMAL HIGH (ref 0.0–0.2)

## 2020-02-26 LAB — CULTURE, BLOOD (ROUTINE X 2): Special Requests: ADEQUATE

## 2020-02-26 LAB — URINALYSIS, ROUTINE W REFLEX MICROSCOPIC
Glucose, UA: 50 mg/dL — AB
Ketones, ur: 5 mg/dL — AB
Nitrite: NEGATIVE
Protein, ur: >=300 mg/dL — AB
Specific Gravity, Urine: 1.034 — ABNORMAL HIGH (ref 1.005–1.030)
pH: 5 (ref 5.0–8.0)

## 2020-02-26 LAB — URIC ACID: Uric Acid, Serum: 8.4 mg/dL — ABNORMAL HIGH (ref 2.5–7.1)

## 2020-02-26 LAB — GLUCOSE, CAPILLARY
Glucose-Capillary: 99 mg/dL (ref 70–99)
Glucose-Capillary: 124 mg/dL — ABNORMAL HIGH (ref 70–99)
Glucose-Capillary: 208 mg/dL — ABNORMAL HIGH (ref 70–99)
Glucose-Capillary: 229 mg/dL — ABNORMAL HIGH (ref 70–99)

## 2020-02-26 LAB — MAGNESIUM
Magnesium: 1.5 mg/dL — ABNORMAL LOW (ref 1.7–2.4)
Magnesium: 1.5 mg/dL — ABNORMAL LOW (ref 1.7–2.4)

## 2020-02-26 LAB — COMPREHENSIVE METABOLIC PANEL
ALT: 24 U/L (ref 0–44)
AST: 66 U/L — ABNORMAL HIGH (ref 15–41)
Albumin: 1.6 g/dL — ABNORMAL LOW (ref 3.5–5.0)
Alkaline Phosphatase: 137 U/L — ABNORMAL HIGH (ref 38–126)
Anion gap: 9 (ref 5–15)
BUN: 31 mg/dL — ABNORMAL HIGH (ref 6–20)
CO2: 22 mmol/L (ref 22–32)
Calcium: 7.8 mg/dL — ABNORMAL LOW (ref 8.9–10.3)
Chloride: 103 mmol/L (ref 98–111)
Creatinine, Ser: 2.52 mg/dL — ABNORMAL HIGH (ref 0.44–1.00)
GFR, Estimated: 22 mL/min — ABNORMAL LOW (ref >60)
Glucose, Bld: 120 mg/dL — ABNORMAL HIGH (ref 70–99)
Potassium: 5.4 mmol/L — ABNORMAL HIGH (ref 3.5–5.1)
Sodium: 134 mmol/L — ABNORMAL LOW (ref 135–145)
Total Bilirubin: 0.7 mg/dL (ref 0.3–1.2)
Total Protein: 5.9 g/dL — ABNORMAL LOW (ref 6.5–8.1)

## 2020-02-26 LAB — CREATININE, URINE, RANDOM: Creatinine, Urine: 183.87 mg/dL

## 2020-02-26 LAB — OSMOLALITY: Osmolality: 297 mOsm/kg — ABNORMAL HIGH (ref 275–295)

## 2020-02-26 LAB — PROTIME-INR
INR: 1.7 — ABNORMAL HIGH (ref 0.8–1.2)
Prothrombin Time: 19.3 seconds — ABNORMAL HIGH (ref 11.4–15.2)

## 2020-02-26 LAB — SODIUM, URINE, RANDOM: Sodium, Ur: <10 mmol/L

## 2020-02-26 LAB — BRAIN NATRIURETIC PEPTIDE: B Natriuretic Peptide: 658.8 pg/mL — ABNORMAL HIGH (ref 0.0–100.0)

## 2020-02-26 LAB — LACTIC ACID, PLASMA: Lactic Acid, Venous: 1.5 mmol/L (ref 0.5–1.9)

## 2020-02-26 MED ORDER — MAGNESIUM SULFATE 2 GM/50ML IV SOLN
2.0000 g | Freq: Once | INTRAVENOUS | Status: AC
  Administered 2020-02-26: 2 g via INTRAVENOUS
  Filled 2020-02-26: qty 50

## 2020-02-26 MED ORDER — LACTATED RINGERS IV SOLN: INTRAVENOUS | Status: DC

## 2020-02-26 MED ORDER — LACTATED RINGERS IV BOLUS: 1000.0000 mL | Freq: Once | INTRAVENOUS | Status: DC

## 2020-02-26 MED ORDER — SODIUM POLYSTYRENE SULFONATE 15 GM/60ML PO SUSP
30.0000 g | Freq: Once | ORAL | Status: AC
  Administered 2020-02-26: 30 g via ORAL
  Filled 2020-02-26: qty 120

## 2020-02-26 NOTE — Progress Notes (Signed)
Pt off floor in radiology.  Apparently hypotension overnight worsening renal failure this morning.  Will see tomorrow. Call if questions.  Ruta Hinds, MD [facility] Office: [phone]

## 2020-02-26 NOTE — Progress Notes (Signed)
PROGRESS NOTE                                                                                                                                                                                                             Patient Demographics:    Morgan Beasley, is a 59 y.o. female, DOB - 12-21-1960, WUX:[id number]  Outpatient Primary MD for the patient is Cullop, [name], NP    LOS - 3  Admit date - 02/23/2020    Chief Complaint  Patient presents with  . Wound Infection       Brief Narrative (HPI from H&P)  - 59 year old female, lives alone but her daughter checks on her regularly, ambulates with the help of a walker with seat, PMH of chronic combined systolic and diastolic CHF, ongoing tobacco abuse, stage II CKD, COPD not on home oxygen, OSA on nightly BiPAP, poorly controlled DM2 with peripheral neuropathy, gout, HLD, HTN, prior substance abuse (THC, cocaine), PAD s/p L SFA stenting 08/2019 and left femoral to below-knee popliteal bypass 09/2019 with nonhealing left foot ulcer, mild CAD presented initially to the [facility] ED on 10/25 due to leg and foot pain.  Admitted for sepsis/Serratia bacteremia, left lower extremity cellulitis complicating chronic nonhealing ulcer, persistent lactic acidosis and concern for critical left lower extremity ischemia.  Vascular surgery was consulted and she was transferred from [facility] hospital to [facility] under my care on 02/25/2020.   Subjective:   Patient in bed, appears comfortable, denies any headache, no fever, no chest pain or pressure, no shortness of breath , no abdominal pain. No focal weakness.  Does have bilateral lower extremity pain which is chronic.   Assessment  & Plan :    1.  Septic shock caused by Serratia bacteremia due to bilateral lower extremity PAD with chronic left lower extremity nonhealing ulcer in a diabetic patient.  She has been treated with IV  fluids along with IV cefepime and oral Flagyl.  Vascular surgery following, sepsis pathophysiology seems to have improved, continue supportive care with antibiotics and wound care, continue hydration with IV fluids and minimize narcotic use as much as possible.  Defer further management of PAD issues to vascular surgery.  2.  PAD s/p left femoropopliteal bypass in 10/19/2019, CAD.  On Plavix and statin for secondary prevention further assess #1 above.  3.   Incidental finding of reactive mediastinal hilar and right supraclavicular adenopathy.  Request PCP to arrange for outpatient biopsy.  4.  Dyslipidemia.  On statin and Zetia.  5.  Chronic narcotic use.  Mildly somnolent, Narcan as needed, reduce narcotics and use with caution, patient and daughter updated on 02/25/2020.  6.  COPD and smoking.  Counseled to quit smoking, supportive care, no wheezing.  7.  Obesity with OSA.  BMI of 32, follow with PCP, use BiPAP whenever she sleeps day or night.  High risk for CO2 retention.  8.  Essential hypertension.  Hold beta-blocker due to hypotension, IV fluids.  9.  Tonic combined systolic and diastolic CHF.  Echo done few months ago showing a EF of 40% with global hypokinesis.  Continue Plavix, statin and beta-blocker for secondary prevention.  10.  AKI with CKD stage IIIa.  Baseline creatinine around 1.4, AKI worse due to a combination of IV contrast for CTA, dehydration and hypotension, hold beta-blocker, aggressive IV fluids, renal ultrasound and urine electrolytes are pending.  Monitor.  11.  Anemia of chronic disease.  Mild drop due to heme dilution.  Monitor.  12. Poor IV access - PICC needed for IV  meds and ABX, CKD 3 A - benefits outweigh the risks.  89.  DM 2, poor outpatient control due to hyperglycemia.  With A1c of 15, on Lantus and sliding scale monitor and adjust.  Lab Results  Component Value Date   HGBA1C 15.1 (H) 02/23/2020   CBG (last 3)  Recent Labs    02/25/20 1605  02/25/20 2105 02/26/20 0715  GLUCAP 143* 121* 99       Condition - Extremely Guarded  Family Communication  [name] - [phone] - on 02/25/20  Code Status :  Full  Consults  :  VVS - Dr. Oneida Alar  Procedures  :    PICC line placement 02/25/2020.    Leg [place] - No DVT  CTA -  IMPRESSION: 1. Patent left lower extremity femoral to below knee popliteal artery bypass graft. Evaluation of the left tibial vasculature is limited by calcifications, however there appears to be a 2 vessel runoff to the left ankle via the anterior tibial and posterior tibial arteries. 2. Progressive peripheral vascular disease involving the right lower extremity. There is now complete occlusion of the mid to distal right superficial femoral artery, new since prior study. There are now appears to be a single vessel runoff to the right foot via the anterior tibial artery. In June, there was a 3 vessel runoff to the right foot. 3. Nonspecific, asymmetric bilateral lower extremity edema (left worse than right). This is of unknown clinical significance. This study cannot adequately exclude a DVT. If there is clinical suspicion for a DVT, follow-up with ultrasound is recommended. 4. No evidence for an acute pulmonary embolism. No evidence for thoracic aortic dissection or aneurysm. 5. Cardiomegaly with small to moderate-sized bilateral pleural effusions, right worse than left. 6. Persistent mediastinal and hilar adenopathy with worsening right supraclavicular adenopathy. Findings may be reactive or due to a lymphoproliferative disorder or metastatic disease. Follow-up is recommended. Of note, the right supraclavicular lymph node is amenable to percutaneous ultrasound-guided biopsy if clinically indicated. 7. Hepatomegaly with likely underlying hepatic steatosis. 8.  Aortic Atherosclerosis   PUD Prophylaxis : PPI  Disposition Plan  :    Status is: Inpatient  Remains inpatient appropriate because:Inpatient level of care  appropriate due to severity of illness   Dispo: The  patient is from: Home              Anticipated d/c is to: Home              Anticipated d/c date is: > 3 days              Patient currently is medically stable to d/c.   DVT Prophylaxis  :  Heparin    Lab Results  Component Value Date   PLT 357 02/26/2020    Diet :  Diet Order            DIET SOFT Room service appropriate? No; Fluid consistency: Thin  Diet effective now                  Inpatient Medications  Scheduled Meds: . amitriptyline  200 mg Oral QHS  . Chlorhexidine Gluconate Cloth  6 each Topical Daily  . clopidogrel  75 mg Oral Daily  . collagenase   Topical Daily  . ezetimibe  10 mg Oral QPC supper  . fluticasone furoate-vilanterol  1 puff Inhalation Daily  . gabapentin  800 mg Oral TID  . heparin injection (subcutaneous)  5,000 Units Subcutaneous Q8H  . insulin aspart  0-15 Units Subcutaneous TID WC  . insulin aspart  0-5 Units Subcutaneous QHS  . insulin glargine  30 Units Subcutaneous QHS  . lipase/protease/amylase  72,000 Units Oral TID WC  . metroNIDAZOLE  500 mg Oral Q8H  . naLOXone (NARCAN)  injection  0.1 mg Intravenous Once  . pantoprazole  40 mg Oral Daily  . rosuvastatin  40 mg Oral Daily  . sodium chloride flush  10-40 mL Intracatheter Q12H   Continuous Infusions: . ceFEPime (MAXIPIME) IV 2 g (02/26/20 0450)  . lactated ringers    . lactated ringers    . magnesium sulfate bolus IVPB 2 g (02/26/20 0848)   PRN Meds:.acetaminophen **OR** [DISCONTINUED] acetaminophen, dextrose, HYDROcodone-acetaminophen, lipase/protease/amylase, [DISCONTINUED] ondansetron **OR** ondansetron (ZOFRAN) IV, polyethylene glycol  Antibiotics  :    Anti-infectives (From admission, onward)   Start     Dose/Rate Route Frequency Ordered Stop   02/25/20 1400  metroNIDAZOLE (FLAGYL) tablet 500 mg        500 mg Oral Every 8 hours 02/25/20 1025     02/24/20 1800  ceFEPIme (MAXIPIME) 2 g in sodium chloride 0.9 % 100  mL IVPB        2 g 200 mL/hr over 30 Minutes Intravenous Every 12 hours 02/24/20 0732     02/24/20 0600  ceFEPIme (MAXIPIME) 2 g in sodium chloride 0.9 % 100 mL IVPB  Status:  Discontinued        2 g 200 mL/hr over 30 Minutes Intravenous Every 8 hours 02/24/20 0440 02/24/20 0732   02/23/20 1015  vancomycin (VANCOREADY) IVPB 1750 mg/350 mL        1,750 mg 175 mL/hr over 120 Minutes Intravenous  Once 02/23/20 1007 02/23/20 1347   02/23/20 1000  cefTRIAXone (ROCEPHIN) 2 g in sodium chloride 0.9 % 100 mL IVPB  Status:  Discontinued        2 g 200 mL/hr over 30 Minutes Intravenous Every 24 hours 02/23/20 0952 02/24/20 0440   02/23/20 1000  metroNIDAZOLE (FLAGYL) IVPB 500 mg  Status:  Discontinued        500 mg 100 mL/hr over 60 Minutes Intravenous Every 8 hours 02/23/20 0952 02/25/20 1025       Time Spent in minutes  [place]  M.D on 02/26/2020 at 9:38 AM  To page go to [email or web address] - password [facility]  [facility] -  Office  [phone]    See all Orders from today for further details    Objective:   Vitals:   02/25/20 2252 02/25/20 2305 02/26/20 0322 02/26/20 0712  BP: (!) 86/59  (!) 87/69 109/69  Pulse: 83 81 84 84  Resp: 20 (!) 22 20 19   Temp: 98.5 F (36.9 C)  98.2 F (36.8 C) (!) 97.5 F (36.4 C)  TempSrc: Oral  Axillary Oral  SpO2: 100% 100% 98%   Weight:      Height:        Wt Readings from Last 3 Encounters:  02/23/20 90.3 kg  01/26/20 90.7 kg  01/16/20 93.9 kg     Intake/Output Summary (Last 24 hours) at 02/26/2020 0938 Last data filed at 02/26/2020 0851 Gross per 24 hour  Intake 570 ml  Output 450 ml  Net 120 ml     Physical Exam  Awake Alert, No new F.N deficits,  [place].AT,PERRAL Supple Neck,No JVD, No cervical lymphadenopathy appriciated.  Symmetrical Chest wall movement, Good air movement bilaterally, CTAB RRR,No Gallops, Rubs or new Murmurs, No Parasternal Heave +ve B.Sounds, Abd Soft, No tenderness, No organomegaly  appriciated, No rebound - guarding or rigidity.  lower extremity wounds as below.          Data Review:    CBC Recent Labs  Lab 02/23/20 0952 02/24/20 0355 02/25/20 0254 02/26/20 0343  WBC 11.4* 11.3* 7.3 8.0  HGB 12.4 9.9* 9.7* 10.5*  HCT 40.4 32.7* 32.0* 34.7*  PLT 419* 313 322 357  MCV 82.1 82.4 82.7 83.2  MCH 25.2* 24.9* 25.1* 25.2*  MCHC 30.7 30.3 30.3 30.3  RDW 18.8* 18.4* 18.7* 18.6*  LYMPHSABS 1.4  --   --  1.4  MONOABS 0.5  --   --  0.7  EOSABS 0.1  --   --  0.1  BASOSABS 0.0  --   --  0.0    Recent Labs  Lab 02/23/20 0952 02/23/20 1149 02/23/20 1152 02/23/20 1432 02/23/20 1453 02/23/20 1934 02/24/20 0355 02/24/20 1901 02/25/20 0254 02/26/20 0148 02/26/20 0343 02/26/20 0344  NA 133*  --   --   --   --   --  136  --  134*  --  134*  --   K 3.7  --   --   --   --   --  3.2*  --  4.1  --  5.4*  --   CL 97*  --   --   --   --   --  104  --  104  --  103  --   CO2 26  --   --   --   --   --  24  --  21*  --  22  --   GLUCOSE 439*  --   --   --   --   --  117*  --  136*  --  120*  --   BUN 16  --   --   --   --   --  14  --  22*  --  31*  --   CREATININE 1.17*  --   --   --   --   --  1.38*  --  1.80*  --  2.52*  --   CALCIUM 8.3*  --   --   --   --   --  7.4*  --  7.6*  --  7.8*  --   AST 18  --   --   --   --   --   --   --   --   --  66*  --   ALT 18  --   --   --   --   --   --   --   --   --  24  --   ALKPHOS 158*  --   --   --   --   --   --   --   --   --  137*  --   BILITOT 0.6  --   --   --   --   --   --   --   --   --  0.7  --   ALBUMIN 2.6*  --   --   --   --   --   --   --   --   --  1.6*  --   MG  --   --   --   --   --   --   --   --   --  1.5* 1.5*  --   PROCALCITON  --  <0.10  --   --   --   --  0.67  --  0.77  --   --   --   LATICACIDVEN 2.6*  --    < > 3.6*  --  >11.0*  --  2.1* 0.9  --   --  1.5  INR 1.2  --   --   --   --   --  1.4*  --   --   --  1.7*  --   HGBA1C  --   --   --   --  15.1*  --   --   --   --   --   --   --    BNP 868.5*  --   --   --   --   --   --   --   --   --  658.8*  --    < > = values in this interval not displayed.    ------------------------------------------------------------------------------------------------------------------ No results for input(s): CHOL, HDL, LDLCALC, TRIG, CHOLHDL, LDLDIRECT in the last 72 hours.  Lab Results  Component Value Date   HGBA1C 15.1 (H) 02/23/2020   ------------------------------------------------------------------------------------------------------------------ No results for input(s): TSH, T4TOTAL, T3FREE, THYROIDAB in the last 72 hours.  Invalid input(s): FREET3  Cardiac Enzymes No results for input(s): CKMB, TROPONINI, MYOGLOBIN in the last 168 hours.  Invalid input(s): CK ------------------------------------------------------------------------------------------------------------------    Component Value Date/Time   BNP 658.8 (H) 02/26/2020 0343    Micro Results Recent Results (from the past 240 hour(s))  Blood Culture (routine x 2)     Status: Abnormal   Collection Time: 02/23/20  9:52 AM   Specimen: BLOOD  Result Value Ref Range Status   Specimen Description   Final    BLOOD RIGHT ANTECUBITAL Performed at [facility] [address] [place], [place] [address]    Special Requests   Final    BOTTLES DRAWN AEROBIC AND ANAEROBIC Blood Culture adequate volume Performed at [facility] [address] [place], [place] [address]    Culture  Setup Time   Final    GRAM NEGATIVE RODS IN BOTH AEROBIC AND ANAEROBIC BOTTLES CRITICAL RESULT CALLED  TO, READ BACK BY AND VERIFIED WITH: Irwin Brakeman [id number] 02/24/2020 Mena Goes Performed at [name], [place] [address], [place], [place] [address]    Culture SERRATIA MARCESCENS (A)  Final   Report Status 02/26/2020 FINAL  Final   Organism ID, Bacteria SERRATIA MARCESCENS  Final      Susceptibility   Serratia marcescens - MIC*    CEFAZOLIN  >=64 RESISTANT Resistant     CEFEPIME <=0.12 SENSITIVE Sensitive     CEFTAZIDIME <=1 SENSITIVE Sensitive     CEFTRIAXONE <=0.25 SENSITIVE Sensitive     CIPROFLOXACIN <=0.25 SENSITIVE Sensitive     GENTAMICIN <=1 SENSITIVE Sensitive     TRIMETH/SULFA <=20 SENSITIVE Sensitive     * SERRATIA MARCESCENS  Blood Culture ID Panel (Reflexed)     Status: Abnormal   Collection Time: 02/23/20  9:52 AM  Result Value Ref Range Status   Enterococcus faecalis NOT DETECTED NOT DETECTED Final   Enterococcus Faecium NOT DETECTED NOT DETECTED Final   Listeria monocytogenes NOT DETECTED NOT DETECTED Final   Staphylococcus species NOT DETECTED NOT DETECTED Final   Staphylococcus aureus (BCID) NOT DETECTED NOT DETECTED Final   Staphylococcus epidermidis NOT DETECTED NOT DETECTED Final   Staphylococcus lugdunensis NOT DETECTED NOT DETECTED Final   Streptococcus species NOT DETECTED NOT DETECTED Final   Streptococcus agalactiae NOT DETECTED NOT DETECTED Final   Streptococcus pneumoniae NOT DETECTED NOT DETECTED Final   Streptococcus pyogenes NOT DETECTED NOT DETECTED Final   A.calcoaceticus-baumannii NOT DETECTED NOT DETECTED Final   Bacteroides fragilis NOT DETECTED NOT DETECTED Final   Enterobacterales DETECTED (A) NOT DETECTED Final    Comment: Enterobacterales represent a large order of gram negative bacteria, not a single organism. CRITICAL RESULT CALLED TO, READ BACK BY AND VERIFIED WITH: M. LILLISTON,PHARMD 0257 02/24/2020 T. TYSOR    Enterobacter cloacae complex NOT DETECTED NOT DETECTED Final   Escherichia coli NOT DETECTED NOT DETECTED Final   Klebsiella aerogenes NOT DETECTED NOT DETECTED Final   Klebsiella oxytoca NOT DETECTED NOT DETECTED Final   Klebsiella pneumoniae NOT DETECTED NOT DETECTED Final   Proteus species NOT DETECTED NOT DETECTED Final   Salmonella species NOT DETECTED NOT DETECTED Final   Serratia marcescens DETECTED (A) NOT DETECTED Final    Comment: CRITICAL RESULT CALLED  TO, READ BACK BY AND VERIFIED WITH: M. LILLISTON,PHARMD 0257 02/24/2020 T. TYSOR    Haemophilus influenzae NOT DETECTED NOT DETECTED Final   Neisseria meningitidis NOT DETECTED NOT DETECTED Final   Pseudomonas aeruginosa NOT DETECTED NOT DETECTED Final   Stenotrophomonas maltophilia NOT DETECTED NOT DETECTED Final   Candida albicans NOT DETECTED NOT DETECTED Final   Candida auris NOT DETECTED NOT DETECTED Final   Candida glabrata NOT DETECTED NOT DETECTED Final   Candida krusei NOT DETECTED NOT DETECTED Final   Candida parapsilosis NOT DETECTED NOT DETECTED Final   Candida tropicalis NOT DETECTED NOT DETECTED Final   Cryptococcus neoformans/gattii NOT DETECTED NOT DETECTED Final   CTX-M ESBL NOT DETECTED NOT DETECTED Final   Carbapenem resistance IMP NOT DETECTED NOT DETECTED Final   Carbapenem resistance KPC NOT DETECTED NOT DETECTED Final   Carbapenem resistance NDM NOT DETECTED NOT DETECTED Final   Carbapenem resist OXA 48 LIKE NOT DETECTED NOT DETECTED Final   Carbapenem resistance VIM NOT DETECTED NOT DETECTED Final    Comment: Performed at [facility], [place], [place], [place] [address]  Respiratory Panel by RT PCR (Flu A&B, Covid) - Nasopharyngeal Swab  Status: None   Collection Time: 02/23/20  9:55 AM   Specimen: Nasopharyngeal Swab  Result Value Ref Range Status   SARS Coronavirus 2 by RT PCR NEGATIVE NEGATIVE Final    Comment: (NOTE) SARS-CoV-2 target nucleic acids are NOT DETECTED.  The SARS-CoV-2 RNA is generally detectable in upper respiratoy specimens during the acute phase of infection. The lowest concentration of SARS-CoV-2 viral copies this assay can detect is 131 copies/mL. A negative result does not preclude SARS-Cov-2 infection and should not be used as the sole basis for treatment or other patient management decisions. A negative result may occur with  improper specimen collection/handling, submission of specimen other than nasopharyngeal  swab, presence of viral mutation(s) within the areas targeted by this assay, and inadequate number of viral copies (<131 copies/mL). A negative result must be combined with clinical observations, patient history, and epidemiological information. The expected result is Negative.  Fact Sheet for Patients:  PinkCheek.be  Fact Sheet for Healthcare Providers:  GravelBags.it  This test is no t yet approved or cleared by the [place] FDA and  has been authorized for detection and/or diagnosis of SARS-CoV-2 by FDA under an Emergency Use Authorization (EUA). This EUA will remain  in effect (meaning this test can be used) for the duration of the COVID-19 declaration under Section 564(b)(1) of the Act, 21 U.S.C. section 360bbb-3(b)(1), unless the authorization is terminated or revoked sooner.     Influenza A by PCR NEGATIVE NEGATIVE Final   Influenza B by PCR NEGATIVE NEGATIVE Final    Comment: (NOTE) The Xpert Xpress SARS-CoV-2/FLU/RSV assay is intended as an aid in  the diagnosis of influenza from Nasopharyngeal swab specimens and  should not be used as a sole basis for treatment. Nasal washings and  aspirates are unacceptable for Xpert Xpress SARS-CoV-2/FLU/RSV  testing.  Fact Sheet for Patients: PinkCheek.be  Fact Sheet for Healthcare Providers: GravelBags.it  This test is not yet approved or cleared by the [place] FDA and  has been authorized for detection and/or diagnosis of SARS-CoV-2 by  FDA under an Emergency Use Authorization (EUA). This EUA will remain  in effect (meaning this test can be used) for the duration of the  Covid-19 declaration under Section 564(b)(1) of the Act, 21  U.S.C. section 360bbb-3(b)(1), unless the authorization is  terminated or revoked. Performed at [facility], [place] [address] [place], [place] [address]    Blood Culture (routine x 2)     Status: None (Preliminary result)   Collection Time: 02/23/20  9:57 AM   Specimen: BLOOD RIGHT FOREARM  Result Value Ref Range Status   Specimen Description   Final    BLOOD RIGHT FOREARM Performed at [facility], [place] [address] [place], [place] [address]    Special Requests   Final    BOTTLES DRAWN AEROBIC AND ANAEROBIC Blood Culture results may not be optimal due to an inadequate volume of blood received in culture bottles Performed at [facility] [address] [place], [place] [address]    Culture   Final    NO GROWTH 3 DAYS Performed at [facility], [place] [address] [place], [place] [address]    Report Status PENDING  Incomplete  Urine culture     Status: Abnormal   Collection Time: 02/23/20 12:09 PM   Specimen: In/Out Cath Urine  Result Value Ref Range Status   Specimen Description   Final    IN/OUT CATH URINE Performed at [facility]  [place] [address] [place], [place] [address]    Special Requests   Final    NONE Performed at [facility], [place] [address] [place], [place] [address]    Culture (A)  Final    <10,000 COLONIES/mL INSIGNIFICANT GROWTH Performed at [facility] [address], [place], [place] [address]    Report Status 02/24/2020 FINAL  Final  MRSA PCR Screening     Status: None   Collection Time: 02/25/20  7:26 AM   Specimen: Nasal Mucosa; Nasopharyngeal  Result Value Ref Range Status   MRSA by PCR NEGATIVE NEGATIVE Final    Comment:        The GeneXpert MRSA Assay (FDA approved for NASAL specimens only), is one component of a comprehensive MRSA colonization surveillance program. It is not intended to diagnose MRSA infection nor to guide or monitor treatment for MRSA infections. Performed at [facility], [place] [address], [place], [place] [address]     Radiology Reports CT ANGIO Delrae Sawyers & OR WO CONTRAST  Result Date:  02/24/2020 CLINICAL DATA:  Chest pain.  Peripheral vascular disease. EXAM: CT ANGIOGRAPHY OF ABDOMINAL AORTA WITH ILIOFEMORAL RUNOFF CT ANGIOGRAPHY OF ABDOMINAL AORTA WITH ILIOFEMORAL RUNOFF TECHNIQUE: Multidetector CT imaging of the abdomen, pelvis and lower extremities was performed using the standard protocol during bolus administration of intravenous contrast. Multiplanar CT image reconstructions and MIPs were obtained to evaluate the vascular anatomy. Multidetector CT imaging of the abdomen, pelvis and lower extremities was performed using the standard protocol during bolus administration of intravenous contrast. Multiplanar CT image reconstructions and MIPs were obtained to evaluate the vascular anatomy. CONTRAST:  149mL OMNIPAQUE IOHEXOL 350 MG/ML SOLN COMPARISON:  CT dated October 09, 2019. CT PE study dated 02/25/2019 FINDINGS: CHEST Cardiovascular: There is no evidence for large centrally located pulmonary embolism. Detection of smaller pulmonary emboli is limited by technique. There is no evidence for a thoracic aortic dissection or aneurysm. The heart size is enlarged. There is no significant pericardial effusion. There are mild coronary artery calcifications. Mediastinum/Nodes: --there are enlarged mediastinal lymph nodes. For example there is a pretracheal lymph node measuring approximately 1.8 x 2.9 cm (axial series 7, image 40). This is relatively similar to prior study. --mild hilar adenopathy is noted. -- No axillary lymphadenopathy. --there is significant right supraclavicular adenopathy which has progressed since the prior study. The dominant lymph node measures 1.2 by 3 point 4 cm (axial series 7, image 15). -- Normal thyroid gland where visualized. -  Unremarkable esophagus. Lungs/Pleura: There is a moderate to large right-sided pleural effusion. There is a small left-sided pleural effusion. There is bibasilar atelectasis. There is no pneumothorax. Musculoskeletal: No chest wall abnormality. No  bony spinal canal stenosis. Review of the MIP images confirms the above findings. VASCULAR Aorta: There are atherosclerotic changes of the abdominal aorta without evidence for an aneurysm. Celiac: Patent without evidence of aneurysm, dissection, vasculitis or significant stenosis. SMA: Patent without evidence of aneurysm, dissection, vasculitis or significant stenosis. Renals: Both renal arteries are patent without evidence of aneurysm, dissection, vasculitis, fibromuscular dysplasia or significant stenosis. IMA: Patent without evidence of aneurysm, dissection, vasculitis or significant stenosis. RIGHT Lower Extremity Inflow: Common, internal and external iliac arteries are patent without evidence of aneurysm, dissection, vasculitis or significant stenosis. Outflow: The right common femoral artery is patent. The right SFA is occluded proximally. There is near immediate reconstitution. There are tandem high-grade areas of stenosis throughout the right SFA. There is  new complete occlusion of the mid to distal right SFA (axial series 7, image 247). Evaluation of the right popliteal artery is limited, however there appears to be high-grade stenosis throughout the vessel. Runoff: There is a high-grade stenosis of the proximal right anterior tibial artery. There appears to be a single vessel runoff to the right lower extremity via the anterior tibial artery. LEFT Lower Extremity Inflow: Common, internal and external iliac arteries are patent without evidence of aneurysm, dissection, vasculitis or significant stenosis. Outflow: The left CFA is widely patent. The proximal left SFA is entirely occluded. There is a new left femoral to below knee popliteal artery bypass graft. The graft is widely patent throughout its course. The profunda femoris artery is patent. Runoff: Heavy calcifications limit evaluation of the tibial vasculature. However there appears to be a 2 vessel runoff via the anterior tibial and posterior tibial  arteries. The peroneal artery is occluded. Veins: The veins are not well evaluated on this study. Review of the MIP images confirms the above findings. NON-VASCULAR Hepatobiliary: There is hepatomegaly with likely underlying hepatic steatosis. There appears to be gallbladder wall thickening with pericholecystic free fluid.There is no biliary ductal dilation. Pancreas: Normal contours without ductal dilatation. No peripancreatic fluid collection. Spleen: Unremarkable. Adrenals/Urinary Tract: --Adrenal glands: Unremarkable. --Right kidney/ureter: No hydronephrosis or radiopaque kidney stones. --Left kidney/ureter: No hydronephrosis or radiopaque kidney stones. --Urinary bladder: Unremarkable. Stomach/Bowel: --Stomach/Duodenum: No hiatal hernia or other gastric abnormality. Normal duodenal course and caliber. --Small bowel: Unremarkable. --Colon: Unremarkable. --Appendix: Normal. Lymphatic: --No retroperitoneal lymphadenopathy. --No mesenteric lymphadenopathy. --there is mild bilateral inguinal adenopathy, left worse than right. Reproductive: Unremarkable Other: No ascites or free air. The abdominal wall is normal. Musculoskeletal. There are postsurgical changes of the left inguinal region. There is bilateral nonspecific lower extremity edema, left worse than right. There are small bilateral suprapatellar joint effusions. IMPRESSION: 1. Patent left lower extremity femoral to below knee popliteal artery bypass graft. Evaluation of the left tibial vasculature is limited by calcifications, however there appears to be a 2 vessel runoff to the left ankle via the anterior tibial and posterior tibial arteries. 2. Progressive peripheral vascular disease involving the right lower extremity. There is now complete occlusion of the mid to distal right superficial femoral artery, new since prior study. There are now appears to be a single vessel runoff to the right foot via the anterior tibial artery. In June, there was a 3 vessel  runoff to the right foot. 3. Nonspecific, asymmetric bilateral lower extremity edema (left worse than right). This is of unknown clinical significance. This study cannot adequately exclude a DVT. If there is clinical suspicion for a DVT, follow-up with ultrasound is recommended. 4. No evidence for an acute pulmonary embolism. No evidence for thoracic aortic dissection or aneurysm. 5. Cardiomegaly with small to moderate-sized bilateral pleural effusions, right worse than left. 6. Persistent mediastinal and hilar adenopathy with worsening right supraclavicular adenopathy. Findings may be reactive or due to a lymphoproliferative disorder or metastatic disease. Follow-up is recommended. Of note, the right supraclavicular lymph node is amenable to percutaneous ultrasound-guided biopsy if clinically indicated. 7. Hepatomegaly with likely underlying hepatic steatosis. 8.  Aortic Atherosclerosis (ICD10-I70.0). Electronically Signed   By: Constance Holster M.D.   On: 02/24/2020 18:13   DG Chest Port 1 View  Result Date: 02/26/2020 CLINICAL DATA:  Shortness of breath; history breast cancer, CHF, chronic kidney disease, COPD, diabetes mellitus, hypertension EXAM: PORTABLE CHEST 1 VIEW COMPARISON:  Portable exam 0729 hours compared to 02/25/2020  FINDINGS: LEFT arm PICC line tip projects over SVC. Enlargement of cardiac silhouette. Mediastinal contours normal. BILATERAL pulmonary infiltrates identified, basilar predominance, favor multifocal pneumonia over pulmonary edema. Subsegmental atelectasis lower LEFT lung. Question minimal LEFT pleural effusion. No pneumothorax. Endplate spur formation thoracic spine. IMPRESSION: Persistent BILATERAL pulmonary infiltrates, predominantly basilar, favoring multifocal pneumonia. Electronically Signed   By: Lavonia Dana M.D.   On: 02/26/2020 07:56   DG Chest Port 1 View  Result Date: 02/25/2020 CLINICAL DATA:  Shortness of breath, on BiPAP EXAM: PORTABLE CHEST 1 VIEW COMPARISON:   Portable exam at 1039 hrs compared to 02/23/2020 FINDINGS: Enlargement of cardiac silhouette with pulmonary vascular congestion. Diffuse infiltrates throughout both lungs, greater at the lower lungs bilaterally, question multifocal pneumonia pulmonary edema considered less likely. No pleural effusion or pneumothorax. Osseous structures unremarkable IMPRESSION: Enlargement of cardiac silhouette with slight pulmonary vascular congestion. Diffuse BILATERAL pulmonary infiltrates greater at lower lungs bilaterally, favor multifocal pneumonia or less likely pulmonary edema. Electronically Signed   By: Lavonia Dana M.D.   On: 02/25/2020 10:49   DG Chest Port 1 View  Result Date: 02/23/2020 CLINICAL DATA:  Concern for sepsis EXAM: PORTABLE CHEST 1 VIEW COMPARISON:  10/09/2019 FINDINGS: Increased bibasilar airspace opacities worse on the left obscuring the left hemidiaphragm concerning for bibasilar pneumonia. Stable cardiomegaly. No CHF pattern or large effusion. No pneumothorax. Aorta atherosclerotic. IMPRESSION: Bibasilar airspace process, worse on the left concerning for pneumonia. Electronically Signed   By: Jerilynn Mages.  Shick M.D.   On: 02/23/2020 10:24   DG Foot 2 Views Left  Result Date: 02/23/2020 CLINICAL DATA:  Chronic wound EXAM: LEFT FOOT - 2 VIEW COMPARISON:  Sep 21, 2019 FINDINGS: Frontal and lateral views were obtained. There is soft tissue swelling. There is suggestion of soft tissue air medial to the distal aspect of the first metatarsal. There is no erosive change or osteomyelitis. No fracture or dislocation. Joint spaces overall appear unremarkable. There are prominent posterior and inferior calcaneal spurs. IMPRESSION: Soft tissue swelling with soft tissue air medial to the mid to distal first metatarsal. No bony destruction or erosion. No appreciable joint space narrowing. There are calcaneal spurs. No fracture or dislocation evident. Electronically Signed   By: Lowella Grip III M.D.   On:  02/23/2020 10:22   CT ANGIO CHEST AORTA W/CM &/OR WO/CM  Result Date: 02/24/2020 CLINICAL DATA:  Chest pain.  Peripheral vascular disease. EXAM: CT ANGIOGRAPHY OF ABDOMINAL AORTA WITH ILIOFEMORAL RUNOFF CT ANGIOGRAPHY OF ABDOMINAL AORTA WITH ILIOFEMORAL RUNOFF TECHNIQUE: Multidetector CT imaging of the abdomen, pelvis and lower extremities was performed using the standard protocol during bolus administration of intravenous contrast. Multiplanar CT image reconstructions and MIPs were obtained to evaluate the vascular anatomy. Multidetector CT imaging of the abdomen, pelvis and lower extremities was performed using the standard protocol during bolus administration of intravenous contrast. Multiplanar CT image reconstructions and MIPs were obtained to evaluate the vascular anatomy. CONTRAST:  162mL OMNIPAQUE IOHEXOL 350 MG/ML SOLN COMPARISON:  CT dated October 09, 2019. CT PE study dated 02/25/2019 FINDINGS: CHEST Cardiovascular: There is no evidence for large centrally located pulmonary embolism. Detection of smaller pulmonary emboli is limited by technique. There is no evidence for a thoracic aortic dissection or aneurysm. The heart size is enlarged. There is no significant pericardial effusion. There are mild coronary artery calcifications. Mediastinum/Nodes: --there are enlarged mediastinal lymph nodes. For example there is a pretracheal lymph node measuring approximately 1.8 x 2.9 cm (axial series 7, image 40). This is relatively similar  to prior study. --mild hilar adenopathy is noted. -- No axillary lymphadenopathy. --there is significant right supraclavicular adenopathy which has progressed since the prior study. The dominant lymph node measures 1.2 by 3 point 4 cm (axial series 7, image 15). -- Normal thyroid gland where visualized. -  Unremarkable esophagus. Lungs/Pleura: There is a moderate to large right-sided pleural effusion. There is a small left-sided pleural effusion. There is bibasilar atelectasis.  There is no pneumothorax. Musculoskeletal: No chest wall abnormality. No bony spinal canal stenosis. Review of the MIP images confirms the above findings. VASCULAR Aorta: There are atherosclerotic changes of the abdominal aorta without evidence for an aneurysm. Celiac: Patent without evidence of aneurysm, dissection, vasculitis or significant stenosis. SMA: Patent without evidence of aneurysm, dissection, vasculitis or significant stenosis. Renals: Both renal arteries are patent without evidence of aneurysm, dissection, vasculitis, fibromuscular dysplasia or significant stenosis. IMA: Patent without evidence of aneurysm, dissection, vasculitis or significant stenosis. RIGHT Lower Extremity Inflow: Common, internal and external iliac arteries are patent without evidence of aneurysm, dissection, vasculitis or significant stenosis. Outflow: The right common femoral artery is patent. The right SFA is occluded proximally. There is near immediate reconstitution. There are tandem high-grade areas of stenosis throughout the right SFA. There is new complete occlusion of the mid to distal right SFA (axial series 7, image 247). Evaluation of the right popliteal artery is limited, however there appears to be high-grade stenosis throughout the vessel. Runoff: There is a high-grade stenosis of the proximal right anterior tibial artery. There appears to be a single vessel runoff to the right lower extremity via the anterior tibial artery. LEFT Lower Extremity Inflow: Common, internal and external iliac arteries are patent without evidence of aneurysm, dissection, vasculitis or significant stenosis. Outflow: The left CFA is widely patent. The proximal left SFA is entirely occluded. There is a new left femoral to below knee popliteal artery bypass graft. The graft is widely patent throughout its course. The profunda femoris artery is patent. Runoff: Heavy calcifications limit evaluation of the tibial vasculature. However there  appears to be a 2 vessel runoff via the anterior tibial and posterior tibial arteries. The peroneal artery is occluded. Veins: The veins are not well evaluated on this study. Review of the MIP images confirms the above findings. NON-VASCULAR Hepatobiliary: There is hepatomegaly with likely underlying hepatic steatosis. There appears to be gallbladder wall thickening with pericholecystic free fluid.There is no biliary ductal dilation. Pancreas: Normal contours without ductal dilatation. No peripancreatic fluid collection. Spleen: Unremarkable. Adrenals/Urinary Tract: --Adrenal glands: Unremarkable. --Right kidney/ureter: No hydronephrosis or radiopaque kidney stones. --Left kidney/ureter: No hydronephrosis or radiopaque kidney stones. --Urinary bladder: Unremarkable. Stomach/Bowel: --Stomach/Duodenum: No hiatal hernia or other gastric abnormality. Normal duodenal course and caliber. --Small bowel: Unremarkable. --Colon: Unremarkable. --Appendix: Normal. Lymphatic: --No retroperitoneal lymphadenopathy. --No mesenteric lymphadenopathy. --there is mild bilateral inguinal adenopathy, left worse than right. Reproductive: Unremarkable Other: No ascites or free air. The abdominal wall is normal. Musculoskeletal. There are postsurgical changes of the left inguinal region. There is bilateral nonspecific lower extremity edema, left worse than right. There are small bilateral suprapatellar joint effusions. IMPRESSION: 1. Patent left lower extremity femoral to below knee popliteal artery bypass graft. Evaluation of the left tibial vasculature is limited by calcifications, however there appears to be a 2 vessel runoff to the left ankle via the anterior tibial and posterior tibial arteries. 2. Progressive peripheral vascular disease involving the right lower extremity. There is now complete occlusion of the mid to distal right superficial femoral  artery, new since prior study. There are now appears to be a single vessel runoff to  the right foot via the anterior tibial artery. In June, there was a 3 vessel runoff to the right foot. 3. Nonspecific, asymmetric bilateral lower extremity edema (left worse than right). This is of unknown clinical significance. This study cannot adequately exclude a DVT. If there is clinical suspicion for a DVT, follow-up with ultrasound is recommended. 4. No evidence for an acute pulmonary embolism. No evidence for thoracic aortic dissection or aneurysm. 5. Cardiomegaly with small to moderate-sized bilateral pleural effusions, right worse than left. 6. Persistent mediastinal and hilar adenopathy with worsening right supraclavicular adenopathy. Findings may be reactive or due to a lymphoproliferative disorder or metastatic disease. Follow-up is recommended. Of note, the right supraclavicular lymph node is amenable to percutaneous ultrasound-guided biopsy if clinically indicated. 7. Hepatomegaly with likely underlying hepatic steatosis. 8.  Aortic Atherosclerosis (ICD10-I70.0). Electronically Signed   By: Constance Holster M.D.   On: 02/24/2020 18:13   VAS [place] LOWER EXTREMITY VENOUS (DVT)  Result Date: 02/25/2020  Lower Venous DVTStudy Indications: Edema.  Risk Factors: Surgery 01-26-2020 LT balloon angioplasty, 10-14-2019 LT fem-pop bypass graft using non-reversed great saphenous vein. Limitations: Body habitus, poor ultrasound/tissue interface and patient intolerant to probe pressure. Comparison Study: No prior studies. Performing Technologist: Darlin Coco  Examination Guidelines: A complete evaluation includes B-mode imaging, spectral Doppler, color Doppler, and power Doppler as needed of all accessible portions of each vessel. Bilateral testing is considered an integral part of a complete examination. Limited examinations for reoccurring indications may be performed as noted. The reflux portion of the exam is performed with the patient in reverse Trendelenburg.   +-----+---------------+---------+-----------+----------+--------------+ RIGHTCompressibilityPhasicitySpontaneityPropertiesThrombus Aging +-----+---------------+---------+-----------+----------+--------------+ CFV  Full           Yes      Yes                                 +-----+---------------+---------+-----------+----------+--------------+   +---------+---------------+---------+-----------+----------+---------------+ LEFT     CompressibilityPhasicitySpontaneityPropertiesThrombus Aging  +---------+---------------+---------+-----------+----------+---------------+ CFV      Full           Yes      Yes                                  +---------+---------------+---------+-----------+----------+---------------+ SFJ      Full                                                         +---------+---------------+---------+-----------+----------+---------------+ FV Prox  Full                                                         +---------+---------------+---------+-----------+----------+---------------+ FV Mid                  Yes      Yes                  Patent by color +---------+---------------+---------+-----------+----------+---------------+ FV Distal  Yes      Yes                  Patent by color +---------+---------------+---------+-----------+----------+---------------+ PFV      Full                                                         +---------+---------------+---------+-----------+----------+---------------+ POP      Full           Yes      Yes                                  +---------+---------------+---------+-----------+----------+---------------+ PTV      Full                                                         +---------+---------------+---------+-----------+----------+---------------+ PERO                    Yes      Yes                  Patent by color  +---------+---------------+---------+-----------+----------+---------------+     Summary: RIGHT: - No evidence of common femoral vein obstruction.  LEFT: - There is no evidence of deep vein thrombosis in the lower extremity. However, portions of this examination were limited- see technologist comments above.  - No cystic structure found in the popliteal fossa.  *See table(s) above for measurements and observations. Electronically signed by Harold Barban MD on 02/25/2020 at 8:54:52 PM.    Final    [place] EKG SITE RITE  Result Date: 02/25/2020 If Site Rite image not attached, placement could not be confirmed due to current cardiac rhythm.

## 2020-02-26 NOTE — Progress Notes (Signed)
Patient wore bipap most of night, one episode of confusion happened where patient took mask off and was combative. Mildly confused this morning but is appropriate in response to questions. Vitals remained stable with ongoing status of systolic hypotension. Hospialist made aware and agreed to continue IV fluids for now.

## 2020-02-27 ENCOUNTER — Inpatient Hospital Stay (HOSPITAL_COMMUNITY): Payer: Medicare Other

## 2020-02-27 ENCOUNTER — Other Ambulatory Visit (HOSPITAL_COMMUNITY): Payer: Medicare Other

## 2020-02-27 DIAGNOSIS — A419 Sepsis, unspecified organism: Secondary | ICD-10-CM | POA: Diagnosis not present

## 2020-02-27 DIAGNOSIS — R6521 Severe sepsis with septic shock: Secondary | ICD-10-CM | POA: Diagnosis not present

## 2020-02-27 LAB — BRAIN NATRIURETIC PEPTIDE: B Natriuretic Peptide: 699.1 pg/mL — ABNORMAL HIGH (ref 0.0–100.0)

## 2020-02-27 LAB — CBC WITH DIFFERENTIAL/PLATELET
Abs Immature Granulocytes: 0.03 10*3/uL (ref 0.00–0.07)
Basophils Absolute: 0 10*3/uL (ref 0.0–0.1)
Basophils Relative: 0 %
Eosinophils Absolute: 0.1 10*3/uL (ref 0.0–0.5)
Eosinophils Relative: 1 %
HCT: 35 % — ABNORMAL LOW (ref 36.0–46.0)
Hemoglobin: 10.6 g/dL — ABNORMAL LOW (ref 12.0–15.0)
Immature Granulocytes: 0 %
Lymphocytes Relative: 21 %
Lymphs Abs: 1.6 10*3/uL (ref 0.7–4.0)
MCH: 24.9 pg — ABNORMAL LOW (ref 26.0–34.0)
MCHC: 30.3 g/dL (ref 30.0–36.0)
MCV: 82.2 fL (ref 80.0–100.0)
Monocytes Absolute: 0.8 10*3/uL (ref 0.1–1.0)
Monocytes Relative: 10 %
Neutro Abs: 5.2 10*3/uL (ref 1.7–7.7)
Neutrophils Relative %: 68 %
Platelets: 371 10*3/uL (ref 150–400)
RBC: 4.26 MIL/uL (ref 3.87–5.11)
RDW: 18.7 % — ABNORMAL HIGH (ref 11.5–15.5)
WBC: 7.7 10*3/uL (ref 4.0–10.5)
nRBC: 1.4 % — ABNORMAL HIGH (ref 0.0–0.2)

## 2020-02-27 LAB — COMPREHENSIVE METABOLIC PANEL
ALT: 181 U/L — ABNORMAL HIGH (ref 0–44)
AST: 920 U/L — ABNORMAL HIGH (ref 15–41)
Albumin: 1.6 g/dL — ABNORMAL LOW (ref 3.5–5.0)
Alkaline Phosphatase: 184 U/L — ABNORMAL HIGH (ref 38–126)
Anion gap: 10 (ref 5–15)
BUN: 39 mg/dL — ABNORMAL HIGH (ref 6–20)
CO2: 22 mmol/L (ref 22–32)
Calcium: 7.7 mg/dL — ABNORMAL LOW (ref 8.9–10.3)
Chloride: 101 mmol/L (ref 98–111)
Creatinine, Ser: 2.88 mg/dL — ABNORMAL HIGH (ref 0.44–1.00)
GFR, Estimated: 18 mL/min — ABNORMAL LOW (ref >60)
Glucose, Bld: 164 mg/dL — ABNORMAL HIGH (ref 70–99)
Potassium: 4.1 mmol/L (ref 3.5–5.1)
Sodium: 133 mmol/L — ABNORMAL LOW (ref 135–145)
Total Bilirubin: 0.4 mg/dL (ref 0.3–1.2)
Total Protein: 5.9 g/dL — ABNORMAL LOW (ref 6.5–8.1)

## 2020-02-27 LAB — MAGNESIUM: Magnesium: 2 mg/dL (ref 1.7–2.4)

## 2020-02-27 LAB — GLUCOSE, CAPILLARY
Glucose-Capillary: 127 mg/dL — ABNORMAL HIGH (ref 70–99)
Glucose-Capillary: 174 mg/dL — ABNORMAL HIGH (ref 70–99)
Glucose-Capillary: 129 mg/dL — ABNORMAL HIGH (ref 70–99)
Glucose-Capillary: 57 mg/dL — ABNORMAL LOW (ref 70–99)
Glucose-Capillary: 129 mg/dL — ABNORMAL HIGH (ref 70–99)

## 2020-02-27 MED ORDER — SODIUM CHLORIDE 0.9 % IV SOLN
2.0000 g | INTRAVENOUS | Status: DC
  Administered 2020-02-28 – 2020-03-02 (×4): 2 g via INTRAVENOUS
  Filled 2020-02-27 (×4): qty 2

## 2020-02-27 MED ORDER — LACTATED RINGERS IV SOLN: INTRAVENOUS | Status: DC

## 2020-02-27 NOTE — Progress Notes (Signed)
Up with PT, brushing teeth at sink. MD aware of urine output, fluids increased.

## 2020-02-27 NOTE — Progress Notes (Signed)
PHARMACY NOTE:  ANTIMICROBIAL RENAL DOSAGE ADJUSTMENT  Current antimicrobial regimen includes a mismatch between antimicrobial dosage and estimated renal function.  As per policy approved by the Pharmacy & Therapeutics and Medical Executive Committees, the antimicrobial dosage will be adjusted accordingly.  Current antimicrobial dosage:  Cefepime 2 gm q 12 hours  Indication: Serratia bacteremia   Renal Function:  Estimated Creatinine Clearance: 24.1 mL/min (A) (by C-G formula based on SCr of 2.88 mg/dL (H)). []      On intermittent HD, scheduled: []      On CRRT    Antimicrobial dosage has been changed to:  Cefepime 2 gm q 24 hours   Additional comments:   Thank you for allowing pharmacy to be a part of this patient's care.  Jimmy Footman, PharmD, BCPS, BCIDP Infectious Diseases Clinical Pharmacist Phone: [phone] 02/27/2020 10:41 AM

## 2020-02-27 NOTE — Progress Notes (Addendum)
PROGRESS NOTE                                                                                                                                                                                                             Patient Demographics:    Morgan Beasley, is a 59 y.o. female, DOB - October 14, 1960, YOV:[id number]  Outpatient Primary MD for the patient is Cullop, [name], NP    LOS - 4  Admit date - 02/23/2020    Chief Complaint  Patient presents with  . Wound Infection       Brief Narrative (HPI from H&P)  - 59 year old female, lives alone but her daughter checks on her regularly, ambulates with the help of a walker with seat, PMH of chronic combined systolic and diastolic CHF, ongoing tobacco abuse, stage II CKD, COPD not on home oxygen, OSA on nightly BiPAP, poorly controlled DM2 with peripheral neuropathy, gout, HLD, HTN, prior substance abuse (THC, cocaine), PAD s/p L SFA stenting 08/2019 and left femoral to below-knee popliteal bypass 09/2019 with nonhealing left foot ulcer, mild CAD presented initially to the [facility] ED on 10/25 due to leg and foot pain.  Admitted for sepsis/Serratia bacteremia, left lower extremity cellulitis complicating chronic nonhealing ulcer, persistent lactic acidosis and concern for critical left lower extremity ischemia.  Vascular surgery was consulted and she was transferred from [facility] long hospital to [facility] under my care on 02/25/2020.   Subjective:   Patient in bed, appears comfortable, denies any headache, no fever, no chest pain or pressure, no shortness of breath , no abdominal pain. No focal weakness.    Assessment  & Plan :    1.  Septic shock caused by Serratia bacteremia due to bilateral lower extremity PAD with chronic left lower extremity nonhealing ulcer in a diabetic patient.  She has been treated with IV fluids along with IV cefepime and oral Flagyl.  Vascular  surgery following, sepsis pathophysiology seems to have improved, continue supportive care with antibiotics and wound care, continue hydration with IV fluids and minimize narcotic use as much as possible.  Defer further management of PAD issues to vascular surgery.  2.  PAD s/p left femoropopliteal bypass in 10/19/2019, CAD.  On Plavix and statin for secondary prevention further assess #1 above.  3.   Incidental finding of reactive mediastinal  hilar and right supraclavicular adenopathy.  Request PCP to arrange for outpatient biopsy.  4.  Dyslipidemia.  On statin and Zetia.  5.  Chronic narcotic use.  Mildly somnolent, Narcan as needed, reduce narcotics and use with caution, patient and daughter updated on 02/25/2020.  6.  COPD and smoking.  Counseled to quit smoking, supportive care, no wheezing.  7.  Obesity with OSA.  BMI of 32, follow with PCP, use BiPAP whenever she sleeps day or night.  High risk for CO2 retention.  8.  Essential hypertension.  Hold beta-blocker due to hypotension, IV fluids.  9.  Tonic combined systolic and diastolic CHF.  Echo done few months ago showing a EF of 40% with global hypokinesis.  Continue Plavix, statin and beta-blocker for secondary prevention.  10.  AKI with CKD stage IIIa.  Baseline creatinine around 1.4, AKI worse due to a combination of IV contrast for CTA, dehydration and hypotension, hold beta-blocker, continue hydration with IV fluids on 02/27/2020, nonacute renal ultrasound, urine sodium less than 10.  Monitor.  11.  Anemia of chronic disease.  Mild drop due to heme dilution.  Monitor.  12. Poor IV access - PICC needed for IV  meds and ABX, CKD 3 A - benefits outweigh the risks.  13.  Elevated transaminases 02/27/2020.  Could be shock liver due to hypotension, right upper quadrant ultrasound done, patient asymptomatic but there is some gallbladder thickening and fluid around it, could have a calculus cholecystitis.  Will consult general surgery.    14. DM 2, poor outpatient control due to hyperglycemia.  With A1c of 15, on Lantus and sliding scale monitor and adjust.  Lab Results  Component Value Date   HGBA1C 15.1 (H) 02/23/2020   CBG (last 3)  Recent Labs    02/26/20 1618 02/26/20 2106 02/27/20 0835  GLUCAP 208* 229* 127*       Condition - Extremely Guarded  Family Communication  [name] - [phone] - on 02/25/20  Code Status :  Full  Consults  :  VVS - Dr. Oneida Alar, general surgery  Procedures  :    PICC line placement 02/25/2020.    Leg [place] - No DVT  Renal US - 1. No hydronephrosis. 2. Mildly echogenic normal size kidneys, compatible with reported history of nonspecific acute renal parenchymal disease. 3. Normal bladder. 4. Incidentally noted nonspecific diffuse gallbladder wall thickening. If there is clinical concern for acute cholecystitis, dedicated right upper quadrant abdominal sonogram could be obtained for further evaluation  CTA -  IMPRESSION: 1. Patent left lower extremity femoral to below knee popliteal artery bypass graft. Evaluation of the left tibial vasculature is limited by calcifications, however there appears to be a 2 vessel runoff to the left ankle via the anterior tibial and posterior tibial arteries. 2. Progressive peripheral vascular disease involving the right lower extremity. There is now complete occlusion of the mid to distal right superficial femoral artery, new since prior study. There are now appears to be a single vessel runoff to the right foot via the anterior tibial artery. In June, there was a 3 vessel runoff to the right foot. 3. Nonspecific, asymmetric bilateral lower extremity edema (left worse than right). This is of unknown clinical significance. This study cannot adequately exclude a DVT. If there is clinical suspicion for a DVT, follow-up with ultrasound is recommended. 4. No evidence for an acute pulmonary embolism. No evidence for thoracic aortic dissection or aneurysm. 5.  Cardiomegaly with small to moderate-sized bilateral pleural  effusions, right worse than left. 6. Persistent mediastinal and hilar adenopathy with worsening right supraclavicular adenopathy. Findings may be reactive or due to a lymphoproliferative disorder or metastatic disease. Follow-up is recommended. Of note, the right supraclavicular lymph node is amenable to percutaneous ultrasound-guided biopsy if clinically indicated. 7. Hepatomegaly with likely underlying hepatic steatosis. 8.  Aortic Atherosclerosis   PUD Prophylaxis : PPI  Disposition Plan  :    Status is: Inpatient  Remains inpatient appropriate because:Inpatient level of care appropriate due to severity of illness   Dispo: The patient is from: Home              Anticipated d/c is to: Home              Anticipated d/c date is: > 3 days              Patient currently is medically stable to d/c.   DVT Prophylaxis  :  Heparin    Lab Results  Component Value Date   PLT 371 02/27/2020    Diet :  Diet Order            DIET SOFT Room service appropriate? No; Fluid consistency: Thin  Diet effective now                  Inpatient Medications  Scheduled Meds: . amitriptyline  200 mg Oral QHS  . Chlorhexidine Gluconate Cloth  6 each Topical Daily  . clopidogrel  75 mg Oral Daily  . collagenase   Topical Daily  . ezetimibe  10 mg Oral QPC supper  . fluticasone furoate-vilanterol  1 puff Inhalation Daily  . gabapentin  800 mg Oral TID  . heparin injection (subcutaneous)  5,000 Units Subcutaneous Q8H  . insulin aspart  0-15 Units Subcutaneous TID WC  . insulin aspart  0-5 Units Subcutaneous QHS  . insulin glargine  30 Units Subcutaneous QHS  . lipase/protease/amylase  72,000 Units Oral TID WC  . metroNIDAZOLE  500 mg Oral Q8H  . naLOXone (NARCAN)  injection  0.1 mg Intravenous Once  . pantoprazole  40 mg Oral Daily  . rosuvastatin  40 mg Oral Daily   Continuous Infusions: . [START ON 02/28/2020] ceFEPime (MAXIPIME)  IV    . lactated ringers Stopped (02/26/20 1041)  . lactated ringers 125 mL/hr at 02/27/20 0745   PRN Meds:.acetaminophen **OR** [DISCONTINUED] acetaminophen, dextrose, HYDROcodone-acetaminophen, lipase/protease/amylase, [DISCONTINUED] ondansetron **OR** ondansetron (ZOFRAN) IV, polyethylene glycol  Antibiotics  :    Anti-infectives (From admission, onward)   Start     Dose/Rate Route Frequency Ordered Stop   02/28/20 0546  ceFEPIme (MAXIPIME) 2 g in sodium chloride 0.9 % 100 mL IVPB        2 g 200 mL/hr over 30 Minutes Intravenous Every 24 hours 02/27/20 1041     02/25/20 1400  metroNIDAZOLE (FLAGYL) tablet 500 mg        500 mg Oral Every 8 hours 02/25/20 1025     02/24/20 1800  ceFEPIme (MAXIPIME) 2 g in sodium chloride 0.9 % 100 mL IVPB  Status:  Discontinued        2 g 200 mL/hr over 30 Minutes Intravenous Every 12 hours 02/24/20 0732 02/27/20 1041   02/24/20 0600  ceFEPIme (MAXIPIME) 2 g in sodium chloride 0.9 % 100 mL IVPB  Status:  Discontinued        2 g 200 mL/hr over 30 Minutes Intravenous Every 8 hours 02/24/20 0440 02/24/20 0732   02/23/20 1015  vancomycin (VANCOREADY) IVPB 1750 mg/350 mL        1,750 mg 175 mL/hr over 120 Minutes Intravenous  Once 02/23/20 1007 02/23/20 1347   02/23/20 1000  cefTRIAXone (ROCEPHIN) 2 g in sodium chloride 0.9 % 100 mL IVPB  Status:  Discontinued        2 g 200 mL/hr over 30 Minutes Intravenous Every 24 hours 02/23/20 0952 02/24/20 0440   02/23/20 1000  metroNIDAZOLE (FLAGYL) IVPB 500 mg  Status:  Discontinued        500 mg 100 mL/hr over 60 Minutes Intravenous Every 8 hours 02/23/20 0952 02/25/20 1025       Time Spent in minutes  30   Lala Lund M.D on 02/27/2020 at 11:42 AM  To page go to [email or web address] - password [facility]  [facility] -  Office  [phone]    See all Orders from today for further details    Objective:   Vitals:   02/26/20 2302 02/26/20 2318 02/27/20 0329 02/27/20 0747  BP: 104/64  98/72 105/64   Pulse: 80 80 79   Resp: 19 18 18    Temp: 98 F (36.7 C)  98.5 F (36.9 C) 97.6 F (36.4 C)  TempSrc: Oral  Axillary Oral  SpO2: 95% 99% 99%   Weight:      Height:        Wt Readings from Last 3 Encounters:  02/23/20 90.3 kg  01/26/20 90.7 kg  01/16/20 93.9 kg     Intake/Output Summary (Last 24 hours) at 02/27/2020 1142 Last data filed at 02/26/2020 1736 Gross per 24 hour  Intake --  Output 500 ml  Net -500 ml     Physical Exam  Awake Alert, No new F.N deficits, Normal affect [place].AT,PERRAL Supple Neck,No JVD, No cervical lymphadenopathy appriciated.  Symmetrical Chest wall movement, Good air movement bilaterally, CTAB RRR,No Gallops, Rubs or new Murmurs, No Parasternal Heave +ve B.Sounds, Abd Soft, No tenderness, No organomegaly appriciated, No rebound - guarding or rigidity.  lower extremity wounds as below.          Data Review:    CBC Recent Labs  Lab 02/23/20 0952 02/24/20 0355 02/25/20 0254 02/26/20 0343 02/27/20 0500  WBC 11.4* 11.3* 7.3 8.0 7.7  HGB 12.4 9.9* 9.7* 10.5* 10.6*  HCT 40.4 32.7* 32.0* 34.7* 35.0*  PLT 419* 313 322 357 371  MCV 82.1 82.4 82.7 83.2 82.2  MCH 25.2* 24.9* 25.1* 25.2* 24.9*  MCHC 30.7 30.3 30.3 30.3 30.3  RDW 18.8* 18.4* 18.7* 18.6* 18.7*  LYMPHSABS 1.4  --   --  1.4 1.6  MONOABS 0.5  --   --  0.7 0.8  EOSABS 0.1  --   --  0.1 0.1  BASOSABS 0.0  --   --  0.0 0.0    Recent Labs  Lab 02/23/20 0952 02/23/20 1149 02/23/20 1152 02/23/20 1432 02/23/20 1453 02/23/20 1934 02/24/20 0355 02/24/20 1901 02/25/20 0254 02/26/20 0148 02/26/20 0343 02/26/20 0344 02/27/20 0500  NA 133*  --   --   --   --   --  136  --  134*  --  134*  --  133*  K 3.7  --   --   --   --   --  3.2*  --  4.1  --  5.4*  --  4.1  CL 97*  --   --   --   --   --  104  --  104  --  103  --  101  CO2 26  --   --   --   --   --  24  --  21*  --  22  --  22  GLUCOSE 439*  --   --   --   --   --  117*  --  136*  --  120*  --  164*  BUN 16   --   --   --   --   --  14  --  22*  --  31*  --  39*  CREATININE 1.17*  --   --   --   --   --  1.38*  --  1.80*  --  2.52*  --  2.88*  CALCIUM 8.3*  --   --   --   --   --  7.4*  --  7.6*  --  7.8*  --  7.7*  AST 18  --   --   --   --   --   --   --   --   --  66*  --  920*  ALT 18  --   --   --   --   --   --   --   --   --  24  --  181*  ALKPHOS 158*  --   --   --   --   --   --   --   --   --  137*  --  184*  BILITOT 0.6  --   --   --   --   --   --   --   --   --  0.7  --  0.4  ALBUMIN 2.6*  --   --   --   --   --   --   --   --   --  1.6*  --  1.6*  MG  --   --   --   --   --   --   --   --   --  1.5* 1.5*  --  2.0  PROCALCITON  --  <0.10  --   --   --   --  0.67  --  0.77  --   --   --   --   LATICACIDVEN 2.6*  --    < > 3.6*  --  >11.0*  --  2.1* 0.9  --   --  1.5  --   INR 1.2  --   --   --   --   --  1.4*  --   --   --  1.7*  --   --   HGBA1C  --   --   --   --  15.1*  --   --   --   --   --   --   --   --   BNP 868.5*  --   --   --   --   --   --   --   --   --  658.8*  --  699.1*   < > = values in this interval not displayed.    ------------------------------------------------------------------------------------------------------------------ No results for input(s): CHOL, HDL, LDLCALC, TRIG, CHOLHDL, LDLDIRECT in the last 72 hours.  Lab Results  Component Value Date   HGBA1C 15.1 (H) 02/23/2020   ------------------------------------------------------------------------------------------------------------------ No results for input(s): TSH, T4TOTAL, T3FREE, THYROIDAB in the last 72 hours.  Invalid input(s): FREET3  Cardiac Enzymes No results for input(s): CKMB, TROPONINI, MYOGLOBIN in the last 168 hours.  Invalid input(s): CK ------------------------------------------------------------------------------------------------------------------    Component Value Date/Time   BNP 699.1 (H) 02/27/2020 0500    Micro Results Recent Results (from the past 240 hour(s))  Blood  Culture (routine x 2)     Status: Abnormal   Collection Time: 02/23/20  9:52 AM   Specimen: BLOOD  Result Value Ref Range Status   Specimen Description   Final    BLOOD RIGHT ANTECUBITAL Performed at [facility] [address] [place], [place] [address]    Special Requests   Final    BOTTLES DRAWN AEROBIC AND ANAEROBIC Blood Culture adequate volume Performed at [facility] [address] [place], [place] [address]    Culture  Setup Time   Final    GRAM NEGATIVE RODS IN BOTH AEROBIC AND ANAEROBIC BOTTLES CRITICAL RESULT CALLED TO, READ BACK BY AND VERIFIED WITH: Irwin Brakeman [id number] 02/24/2020 Mena Goes Performed at [facility], [place] [address] [place], [place] [address]    Culture SERRATIA MARCESCENS (A)  Final   Report Status 02/26/2020 FINAL  Final   Organism ID, Bacteria SERRATIA MARCESCENS  Final      Susceptibility   Serratia marcescens - MIC*    CEFAZOLIN >=64 RESISTANT Resistant     CEFEPIME <=0.12 SENSITIVE Sensitive     CEFTAZIDIME <=1 SENSITIVE Sensitive     CEFTRIAXONE <=0.25 SENSITIVE Sensitive     CIPROFLOXACIN <=0.25 SENSITIVE Sensitive     GENTAMICIN <=1 SENSITIVE Sensitive     TRIMETH/SULFA <=20 SENSITIVE Sensitive     * SERRATIA MARCESCENS  Blood Culture ID Panel (Reflexed)     Status: Abnormal   Collection Time: 02/23/20  9:52 AM  Result Value Ref Range Status   Enterococcus faecalis NOT DETECTED NOT DETECTED Final   Enterococcus Faecium NOT DETECTED NOT DETECTED Final   Listeria monocytogenes NOT DETECTED NOT DETECTED Final   Staphylococcus species NOT DETECTED NOT DETECTED Final   Staphylococcus aureus (BCID) NOT DETECTED NOT DETECTED Final   Staphylococcus epidermidis NOT DETECTED NOT DETECTED Final   Staphylococcus lugdunensis NOT DETECTED NOT DETECTED Final   Streptococcus species NOT DETECTED NOT DETECTED Final   Streptococcus agalactiae NOT DETECTED NOT DETECTED Final   Streptococcus pneumoniae  NOT DETECTED NOT DETECTED Final   Streptococcus pyogenes NOT DETECTED NOT DETECTED Final   A.calcoaceticus-baumannii NOT DETECTED NOT DETECTED Final   Bacteroides fragilis NOT DETECTED NOT DETECTED Final   Enterobacterales DETECTED (A) NOT DETECTED Final    Comment: Enterobacterales represent a large order of gram negative bacteria, not a single organism. CRITICAL RESULT CALLED TO, READ BACK BY AND VERIFIED WITH: M. LILLISTON,PHARMD 0257 02/24/2020 T. TYSOR    Enterobacter cloacae complex NOT DETECTED NOT DETECTED Final   Escherichia coli NOT DETECTED NOT DETECTED Final   Klebsiella aerogenes NOT DETECTED NOT DETECTED Final   Klebsiella oxytoca NOT DETECTED NOT DETECTED Final   Klebsiella pneumoniae NOT DETECTED NOT DETECTED Final   Proteus species NOT DETECTED NOT DETECTED Final   Salmonella species NOT DETECTED NOT DETECTED Final   Serratia marcescens DETECTED (A) NOT DETECTED Final    Comment: CRITICAL RESULT CALLED TO, READ BACK BY AND VERIFIED WITH: M. LILLISTON,PHARMD 0257 02/24/2020 T. TYSOR    Haemophilus influenzae NOT DETECTED NOT DETECTED Final   Neisseria meningitidis NOT DETECTED NOT DETECTED Final   Pseudomonas aeruginosa NOT DETECTED NOT DETECTED Final   Stenotrophomonas maltophilia  NOT DETECTED NOT DETECTED Final   Candida albicans NOT DETECTED NOT DETECTED Final   Candida auris NOT DETECTED NOT DETECTED Final   Candida glabrata NOT DETECTED NOT DETECTED Final   Candida krusei NOT DETECTED NOT DETECTED Final   Candida parapsilosis NOT DETECTED NOT DETECTED Final   Candida tropicalis NOT DETECTED NOT DETECTED Final   Cryptococcus neoformans/gattii NOT DETECTED NOT DETECTED Final   CTX-M ESBL NOT DETECTED NOT DETECTED Final   Carbapenem resistance IMP NOT DETECTED NOT DETECTED Final   Carbapenem resistance KPC NOT DETECTED NOT DETECTED Final   Carbapenem resistance NDM NOT DETECTED NOT DETECTED Final   Carbapenem resist OXA 48 LIKE NOT DETECTED NOT DETECTED Final    Carbapenem resistance VIM NOT DETECTED NOT DETECTED Final    Comment: Performed at [facility], [address] [place], [place] [address]  Respiratory Panel by RT PCR (Flu A&B, Covid) - Nasopharyngeal Swab     Status: None   Collection Time: 02/23/20  9:55 AM   Specimen: Nasopharyngeal Swab  Result Value Ref Range Status   SARS Coronavirus 2 by RT PCR NEGATIVE NEGATIVE Final    Comment: (NOTE) SARS-CoV-2 target nucleic acids are NOT DETECTED.  The SARS-CoV-2 RNA is generally detectable in upper respiratoy specimens during the acute phase of infection. The lowest concentration of SARS-CoV-2 viral copies this assay can detect is 131 copies/mL. A negative result does not preclude SARS-Cov-2 infection and should not be used as the sole basis for treatment or other patient management decisions. A negative result may occur with  improper specimen collection/handling, submission of specimen other than nasopharyngeal swab, presence of viral mutation(s) within the areas targeted by this assay, and inadequate number of viral copies (<131 copies/mL). A negative result must be combined with clinical observations, patient history, and epidemiological information. The expected result is Negative.  Fact Sheet for Patients:  PinkCheek.be  Fact Sheet for Healthcare Providers:  GravelBags.it  This test is no t yet approved or cleared by the [place] FDA and  has been authorized for detection and/or diagnosis of SARS-CoV-2 by FDA under an Emergency Use Authorization (EUA). This EUA will remain  in effect (meaning this test can be used) for the duration of the COVID-19 declaration under Section 564(b)(1) of the Act, 21 U.S.C. section 360bbb-3(b)(1), unless the authorization is terminated or revoked sooner.     Influenza A by PCR NEGATIVE NEGATIVE Final   Influenza B by PCR NEGATIVE NEGATIVE Final    Comment: (NOTE) The Xpert  Xpress SARS-CoV-2/FLU/RSV assay is intended as an aid in  the diagnosis of influenza from Nasopharyngeal swab specimens and  should not be used as a sole basis for treatment. Nasal washings and  aspirates are unacceptable for Xpert Xpress SARS-CoV-2/FLU/RSV  testing.  Fact Sheet for Patients: PinkCheek.be  Fact Sheet for Healthcare Providers: GravelBags.it  This test is not yet approved or cleared by the [place] FDA and  has been authorized for detection and/or diagnosis of SARS-CoV-2 by  FDA under an Emergency Use Authorization (EUA). This EUA will remain  in effect (meaning this test can be used) for the duration of the  Covid-19 declaration under Section 564(b)(1) of the Act, 21  U.S.C. section 360bbb-3(b)(1), unless the authorization is  terminated or revoked. Performed at [facility], [place] [address], [place], [place] [address]   Blood Culture (routine x 2)     Status: None (Preliminary result)   Collection Time: 02/23/20  9:57 AM  Specimen: BLOOD RIGHT FOREARM  Result Value Ref Range Status   Specimen Description   Final    BLOOD RIGHT FOREARM Performed at [facility], [address], [place], [place] [address]    Special Requests   Final    BOTTLES DRAWN AEROBIC AND ANAEROBIC Blood Culture results may not be optimal due to an inadequate volume of blood received in culture bottles Performed at [facility] [address], [place], [place] [address]    Culture   Final    NO GROWTH 3 DAYS Performed at [facility], [place] [address], [place], [place] [address]    Report Status PENDING  Incomplete  Urine culture     Status: Abnormal   Collection Time: 02/23/20 12:09 PM   Specimen: In/Out Cath Urine  Result Value Ref Range Status   Specimen Description   Final    IN/OUT CATH URINE Performed at [facility] [address], [place],  [place] [address]    Special Requests   Final    NONE Performed at [facility], [place] [address], [place], [place] [address]    Culture (A)  Final    <10,000 COLONIES/mL INSIGNIFICANT GROWTH Performed at [facility] [address] [place], [place] [address]    Report Status 02/24/2020 FINAL  Final  MRSA PCR Screening     Status: None   Collection Time: 02/25/20  7:26 AM   Specimen: Nasal Mucosa; Nasopharyngeal  Result Value Ref Range Status   MRSA by PCR NEGATIVE NEGATIVE Final    Comment:        The GeneXpert MRSA Assay (FDA approved for NASAL specimens only), is one component of a comprehensive MRSA colonization surveillance program. It is not intended to diagnose MRSA infection nor to guide or monitor treatment for MRSA infections. Performed at [facility], [place] [address], [place],  [address]     Radiology Reports CT ANGIO Delrae Sawyers & OR WO CONTRAST  Result Date: 02/24/2020 CLINICAL DATA:  Chest pain.  Peripheral vascular disease. EXAM: CT ANGIOGRAPHY OF ABDOMINAL AORTA WITH ILIOFEMORAL RUNOFF CT ANGIOGRAPHY OF ABDOMINAL AORTA WITH ILIOFEMORAL RUNOFF TECHNIQUE: Multidetector CT imaging of the abdomen, pelvis and lower extremities was performed using the standard protocol during bolus administration of intravenous contrast. Multiplanar CT image reconstructions and MIPs were obtained to evaluate the vascular anatomy. Multidetector CT imaging of the abdomen, pelvis and lower extremities was performed using the standard protocol during bolus administration of intravenous contrast. Multiplanar CT image reconstructions and MIPs were obtained to evaluate the vascular anatomy. CONTRAST:  120mL OMNIPAQUE IOHEXOL 350 MG/ML SOLN COMPARISON:  CT dated October 09, 2019. CT PE study dated 02/25/2019 FINDINGS: CHEST Cardiovascular: There is no evidence for large centrally located pulmonary embolism. Detection of smaller pulmonary emboli is limited by technique.  There is no evidence for a thoracic aortic dissection or aneurysm. The heart size is enlarged. There is no significant pericardial effusion. There are mild coronary artery calcifications. Mediastinum/Nodes: --there are enlarged mediastinal lymph nodes. For example there is a pretracheal lymph node measuring approximately 1.8 x 2.9 cm (axial series 7, image 40). This is relatively similar to prior study. --mild hilar adenopathy is noted. -- No axillary lymphadenopathy. --there is significant right supraclavicular adenopathy which has progressed since the prior study. The dominant lymph node measures 1.2 by 3 point 4 cm (axial series 7, image 15). -- Normal thyroid gland where visualized. -  Unremarkable esophagus.  Lungs/Pleura: There is a moderate to large right-sided pleural effusion. There is a small left-sided pleural effusion. There is bibasilar atelectasis. There is no pneumothorax. Musculoskeletal: No chest wall abnormality. No bony spinal canal stenosis. Review of the MIP images confirms the above findings. VASCULAR Aorta: There are atherosclerotic changes of the abdominal aorta without evidence for an aneurysm. Celiac: Patent without evidence of aneurysm, dissection, vasculitis or significant stenosis. SMA: Patent without evidence of aneurysm, dissection, vasculitis or significant stenosis. Renals: Both renal arteries are patent without evidence of aneurysm, dissection, vasculitis, fibromuscular dysplasia or significant stenosis. IMA: Patent without evidence of aneurysm, dissection, vasculitis or significant stenosis. RIGHT Lower Extremity Inflow: Common, internal and external iliac arteries are patent without evidence of aneurysm, dissection, vasculitis or significant stenosis. Outflow: The right common femoral artery is patent. The right SFA is occluded proximally. There is near immediate reconstitution. There are tandem high-grade areas of stenosis throughout the right SFA. There is new complete occlusion  of the mid to distal right SFA (axial series 7, image 247). Evaluation of the right popliteal artery is limited, however there appears to be high-grade stenosis throughout the vessel. Runoff: There is a high-grade stenosis of the proximal right anterior tibial artery. There appears to be a single vessel runoff to the right lower extremity via the anterior tibial artery. LEFT Lower Extremity Inflow: Common, internal and external iliac arteries are patent without evidence of aneurysm, dissection, vasculitis or significant stenosis. Outflow: The left CFA is widely patent. The proximal left SFA is entirely occluded. There is a new left femoral to below knee popliteal artery bypass graft. The graft is widely patent throughout its course. The profunda femoris artery is patent. Runoff: Heavy calcifications limit evaluation of the tibial vasculature. However there appears to be a 2 vessel runoff via the anterior tibial and posterior tibial arteries. The peroneal artery is occluded. Veins: The veins are not well evaluated on this study. Review of the MIP images confirms the above findings. NON-VASCULAR Hepatobiliary: There is hepatomegaly with likely underlying hepatic steatosis. There appears to be gallbladder wall thickening with pericholecystic free fluid.There is no biliary ductal dilation. Pancreas: Normal contours without ductal dilatation. No peripancreatic fluid collection. Spleen: Unremarkable. Adrenals/Urinary Tract: --Adrenal glands: Unremarkable. --Right kidney/ureter: No hydronephrosis or radiopaque kidney stones. --Left kidney/ureter: No hydronephrosis or radiopaque kidney stones. --Urinary bladder: Unremarkable. Stomach/Bowel: --Stomach/Duodenum: No hiatal hernia or other gastric abnormality. Normal duodenal course and caliber. --Small bowel: Unremarkable. --Colon: Unremarkable. --Appendix: Normal. Lymphatic: --No retroperitoneal lymphadenopathy. --No mesenteric lymphadenopathy. --there is mild bilateral  inguinal adenopathy, left worse than right. Reproductive: Unremarkable Other: No ascites or free air. The abdominal wall is normal. Musculoskeletal. There are postsurgical changes of the left inguinal region. There is bilateral nonspecific lower extremity edema, left worse than right. There are small bilateral suprapatellar joint effusions. IMPRESSION: 1. Patent left lower extremity femoral to below knee popliteal artery bypass graft. Evaluation of the left tibial vasculature is limited by calcifications, however there appears to be a 2 vessel runoff to the left ankle via the anterior tibial and posterior tibial arteries. 2. Progressive peripheral vascular disease involving the right lower extremity. There is now complete occlusion of the mid to distal right superficial femoral artery, new since prior study. There are now appears to be a single vessel runoff to the right foot via the anterior tibial artery. In June, there was a 3 vessel runoff to the right foot. 3. Nonspecific, asymmetric bilateral lower extremity edema (left worse than right). This is of unknown  clinical significance. This study cannot adequately exclude a DVT. If there is clinical suspicion for a DVT, follow-up with ultrasound is recommended. 4. No evidence for an acute pulmonary embolism. No evidence for thoracic aortic dissection or aneurysm. 5. Cardiomegaly with small to moderate-sized bilateral pleural effusions, right worse than left. 6. Persistent mediastinal and hilar adenopathy with worsening right supraclavicular adenopathy. Findings may be reactive or due to a lymphoproliferative disorder or metastatic disease. Follow-up is recommended. Of note, the right supraclavicular lymph node is amenable to percutaneous ultrasound-guided biopsy if clinically indicated. 7. Hepatomegaly with likely underlying hepatic steatosis. 8.  Aortic Atherosclerosis (ICD10-I70.0). Electronically Signed   By: Constance Holster M.D.   On: 02/24/2020 18:13   US  RENAL  Result Date: 02/26/2020 CLINICAL DATA:  Acute kidney injury. EXAM: RENAL / URINARY TRACT ULTRASOUND COMPLETE COMPARISON:  CT angiogram of the abdomen and pelvis from 02/24/2020. FINDINGS: Right Kidney: Renal measurements: 11.2 x 4.8 x 6.0 cm = volume: 169 mL. Mildly echogenic renal parenchyma, normal thickness. No hydronephrosis. No renal mass. Left Kidney: Renal measurements: 12.2 x 5.4 x 5.6 cm = volume: 184 mL. Mildly echogenic renal parenchyma, normal thickness. No hydronephrosis. No renal mass. Bladder: Appears normal for degree of bladder distention. Other: Incidentally noted diffuse gallbladder wall thickening. IMPRESSION: 1. No hydronephrosis. 2. Mildly echogenic normal size kidneys, compatible with reported history of nonspecific acute renal parenchymal disease. 3. Normal bladder. 4. Incidentally noted nonspecific diffuse gallbladder wall thickening. If there is clinical concern for acute cholecystitis, dedicated right upper quadrant abdominal sonogram could be obtained for further evaluation. Electronically Signed   By: Ilona Sorrel M.D.   On: 02/26/2020 10:20   DG Chest Port 1 View  Result Date: 02/27/2020 CLINICAL DATA:  Short of breath EXAM: PORTABLE CHEST 1 VIEW COMPARISON:  02/26/2020 FINDINGS: Cardiac enlargement and vascular congestion. Mild improvement in bilateral airspace disease consistent with edema. No significant effusion. Left arm PICC tip in the SVC IMPRESSION: Congestive heart failure with mild interval improvement in edema. Electronically Signed   By: Franchot Gallo M.D.   On: 02/27/2020 08:02   DG Chest Port 1 View  Result Date: 02/26/2020 CLINICAL DATA:  Shortness of breath; history breast cancer, CHF, chronic kidney disease, COPD, diabetes mellitus, hypertension EXAM: PORTABLE CHEST 1 VIEW COMPARISON:  Portable exam 0729 hours compared to 02/25/2020 FINDINGS: LEFT arm PICC line tip projects over SVC. Enlargement of cardiac silhouette. Mediastinal contours normal.  BILATERAL pulmonary infiltrates identified, basilar predominance, favor multifocal pneumonia over pulmonary edema. Subsegmental atelectasis lower LEFT lung. Question minimal LEFT pleural effusion. No pneumothorax. Endplate spur formation thoracic spine. IMPRESSION: Persistent BILATERAL pulmonary infiltrates, predominantly basilar, favoring multifocal pneumonia. Electronically Signed   By: Lavonia Dana M.D.   On: 02/26/2020 07:56   DG Chest Port 1 View  Result Date: 02/25/2020 CLINICAL DATA:  Shortness of breath, on BiPAP EXAM: PORTABLE CHEST 1 VIEW COMPARISON:  Portable exam at 1039 hrs compared to 02/23/2020 FINDINGS: Enlargement of cardiac silhouette with pulmonary vascular congestion. Diffuse infiltrates throughout both lungs, greater at the lower lungs bilaterally, question multifocal pneumonia pulmonary edema considered less likely. No pleural effusion or pneumothorax. Osseous structures unremarkable IMPRESSION: Enlargement of cardiac silhouette with slight pulmonary vascular congestion. Diffuse BILATERAL pulmonary infiltrates greater at lower lungs bilaterally, favor multifocal pneumonia or less likely pulmonary edema. Electronically Signed   By: Lavonia Dana M.D.   On: 02/25/2020 10:49   DG Chest Port 1 View  Result Date: 02/23/2020 CLINICAL DATA:  Concern for sepsis EXAM: PORTABLE  CHEST 1 VIEW COMPARISON:  10/09/2019 FINDINGS: Increased bibasilar airspace opacities worse on the left obscuring the left hemidiaphragm concerning for bibasilar pneumonia. Stable cardiomegaly. No CHF pattern or large effusion. No pneumothorax. Aorta atherosclerotic. IMPRESSION: Bibasilar airspace process, worse on the left concerning for pneumonia. Electronically Signed   By: Jerilynn Mages.  Shick M.D.   On: 02/23/2020 10:24   DG Foot 2 Views Left  Result Date: 02/23/2020 CLINICAL DATA:  Chronic wound EXAM: LEFT FOOT - 2 VIEW COMPARISON:  Sep 21, 2019 FINDINGS: Frontal and lateral views were obtained. There is soft tissue  swelling. There is suggestion of soft tissue air medial to the distal aspect of the first metatarsal. There is no erosive change or osteomyelitis. No fracture or dislocation. Joint spaces overall appear unremarkable. There are prominent posterior and inferior calcaneal spurs. IMPRESSION: Soft tissue swelling with soft tissue air medial to the mid to distal first metatarsal. No bony destruction or erosion. No appreciable joint space narrowing. There are calcaneal spurs. No fracture or dislocation evident. Electronically Signed   By: Lowella Grip III M.D.   On: 02/23/2020 10:22   CT ANGIO CHEST AORTA W/CM &/OR WO/CM  Result Date: 02/24/2020 CLINICAL DATA:  Chest pain.  Peripheral vascular disease. EXAM: CT ANGIOGRAPHY OF ABDOMINAL AORTA WITH ILIOFEMORAL RUNOFF CT ANGIOGRAPHY OF ABDOMINAL AORTA WITH ILIOFEMORAL RUNOFF TECHNIQUE: Multidetector CT imaging of the abdomen, pelvis and lower extremities was performed using the standard protocol during bolus administration of intravenous contrast. Multiplanar CT image reconstructions and MIPs were obtained to evaluate the vascular anatomy. Multidetector CT imaging of the abdomen, pelvis and lower extremities was performed using the standard protocol during bolus administration of intravenous contrast. Multiplanar CT image reconstructions and MIPs were obtained to evaluate the vascular anatomy. CONTRAST:  14mL OMNIPAQUE IOHEXOL 350 MG/ML SOLN COMPARISON:  CT dated October 09, 2019. CT PE study dated 02/25/2019 FINDINGS: CHEST Cardiovascular: There is no evidence for large centrally located pulmonary embolism. Detection of smaller pulmonary emboli is limited by technique. There is no evidence for a thoracic aortic dissection or aneurysm. The heart size is enlarged. There is no significant pericardial effusion. There are mild coronary artery calcifications. Mediastinum/Nodes: --there are enlarged mediastinal lymph nodes. For example there is a pretracheal lymph node  measuring approximately 1.8 x 2.9 cm (axial series 7, image 40). This is relatively similar to prior study. --mild hilar adenopathy is noted. -- No axillary lymphadenopathy. --there is significant right supraclavicular adenopathy which has progressed since the prior study. The dominant lymph node measures 1.2 by 3 point 4 cm (axial series 7, image 15). -- Normal thyroid gland where visualized. -  Unremarkable esophagus. Lungs/Pleura: There is a moderate to large right-sided pleural effusion. There is a small left-sided pleural effusion. There is bibasilar atelectasis. There is no pneumothorax. Musculoskeletal: No chest wall abnormality. No bony spinal canal stenosis. Review of the MIP images confirms the above findings. VASCULAR Aorta: There are atherosclerotic changes of the abdominal aorta without evidence for an aneurysm. Celiac: Patent without evidence of aneurysm, dissection, vasculitis or significant stenosis. SMA: Patent without evidence of aneurysm, dissection, vasculitis or significant stenosis. Renals: Both renal arteries are patent without evidence of aneurysm, dissection, vasculitis, fibromuscular dysplasia or significant stenosis. IMA: Patent without evidence of aneurysm, dissection, vasculitis or significant stenosis. RIGHT Lower Extremity Inflow: Common, internal and external iliac arteries are patent without evidence of aneurysm, dissection, vasculitis or significant stenosis. Outflow: The right common femoral artery is patent. The right SFA is occluded proximally. There is near immediate  reconstitution. There are tandem high-grade areas of stenosis throughout the right SFA. There is new complete occlusion of the mid to distal right SFA (axial series 7, image 247). Evaluation of the right popliteal artery is limited, however there appears to be high-grade stenosis throughout the vessel. Runoff: There is a high-grade stenosis of the proximal right anterior tibial artery. There appears to be a single  vessel runoff to the right lower extremity via the anterior tibial artery. LEFT Lower Extremity Inflow: Common, internal and external iliac arteries are patent without evidence of aneurysm, dissection, vasculitis or significant stenosis. Outflow: The left CFA is widely patent. The proximal left SFA is entirely occluded. There is a new left femoral to below knee popliteal artery bypass graft. The graft is widely patent throughout its course. The profunda femoris artery is patent. Runoff: Heavy calcifications limit evaluation of the tibial vasculature. However there appears to be a 2 vessel runoff via the anterior tibial and posterior tibial arteries. The peroneal artery is occluded. Veins: The veins are not well evaluated on this study. Review of the MIP images confirms the above findings. NON-VASCULAR Hepatobiliary: There is hepatomegaly with likely underlying hepatic steatosis. There appears to be gallbladder wall thickening with pericholecystic free fluid.There is no biliary ductal dilation. Pancreas: Normal contours without ductal dilatation. No peripancreatic fluid collection. Spleen: Unremarkable. Adrenals/Urinary Tract: --Adrenal glands: Unremarkable. --Right kidney/ureter: No hydronephrosis or radiopaque kidney stones. --Left kidney/ureter: No hydronephrosis or radiopaque kidney stones. --Urinary bladder: Unremarkable. Stomach/Bowel: --Stomach/Duodenum: No hiatal hernia or other gastric abnormality. Normal duodenal course and caliber. --Small bowel: Unremarkable. --Colon: Unremarkable. --Appendix: Normal. Lymphatic: --No retroperitoneal lymphadenopathy. --No mesenteric lymphadenopathy. --there is mild bilateral inguinal adenopathy, left worse than right. Reproductive: Unremarkable Other: No ascites or free air. The abdominal wall is normal. Musculoskeletal. There are postsurgical changes of the left inguinal region. There is bilateral nonspecific lower extremity edema, left worse than right. There are small  bilateral suprapatellar joint effusions. IMPRESSION: 1. Patent left lower extremity femoral to below knee popliteal artery bypass graft. Evaluation of the left tibial vasculature is limited by calcifications, however there appears to be a 2 vessel runoff to the left ankle via the anterior tibial and posterior tibial arteries. 2. Progressive peripheral vascular disease involving the right lower extremity. There is now complete occlusion of the mid to distal right superficial femoral artery, new since prior study. There are now appears to be a single vessel runoff to the right foot via the anterior tibial artery. In June, there was a 3 vessel runoff to the right foot. 3. Nonspecific, asymmetric bilateral lower extremity edema (left worse than right). This is of unknown clinical significance. This study cannot adequately exclude a DVT. If there is clinical suspicion for a DVT, follow-up with ultrasound is recommended. 4. No evidence for an acute pulmonary embolism. No evidence for thoracic aortic dissection or aneurysm. 5. Cardiomegaly with small to moderate-sized bilateral pleural effusions, right worse than left. 6. Persistent mediastinal and hilar adenopathy with worsening right supraclavicular adenopathy. Findings may be reactive or due to a lymphoproliferative disorder or metastatic disease. Follow-up is recommended. Of note, the right supraclavicular lymph node is amenable to percutaneous ultrasound-guided biopsy if clinically indicated. 7. Hepatomegaly with likely underlying hepatic steatosis. 8.  Aortic Atherosclerosis (ICD10-I70.0). Electronically Signed   By: Constance Holster M.D.   On: 02/24/2020 18:13   VAS [place] LOWER EXTREMITY VENOUS (DVT)  Result Date: 02/25/2020  Lower Venous DVTStudy Indications: Edema.  Risk Factors: Surgery 01-26-2020 LT balloon angioplasty, 10-14-2019 LT  fem-pop bypass graft using non-reversed great saphenous vein. Limitations: Body habitus, poor ultrasound/tissue interface and  patient intolerant to probe pressure. Comparison Study: No prior studies. Performing Technologist: Darlin Coco  Examination Guidelines: A complete evaluation includes B-mode imaging, spectral Doppler, color Doppler, and power Doppler as needed of all accessible portions of each vessel. Bilateral testing is considered an integral part of a complete examination. Limited examinations for reoccurring indications may be performed as noted. The reflux portion of the exam is performed with the patient in reverse Trendelenburg.  +-----+---------------+---------+-----------+----------+--------------+ RIGHTCompressibilityPhasicitySpontaneityPropertiesThrombus Aging +-----+---------------+---------+-----------+----------+--------------+ CFV  Full           Yes      Yes                                 +-----+---------------+---------+-----------+----------+--------------+   +---------+---------------+---------+-----------+----------+---------------+ LEFT     CompressibilityPhasicitySpontaneityPropertiesThrombus Aging  +---------+---------------+---------+-----------+----------+---------------+ CFV      Full           Yes      Yes                                  +---------+---------------+---------+-----------+----------+---------------+ SFJ      Full                                                         +---------+---------------+---------+-----------+----------+---------------+ FV Prox  Full                                                         +---------+---------------+---------+-----------+----------+---------------+ FV Mid                  Yes      Yes                  Patent by color +---------+---------------+---------+-----------+----------+---------------+ FV Distal               Yes      Yes                  Patent by color +---------+---------------+---------+-----------+----------+---------------+ PFV      Full                                                          +---------+---------------+---------+-----------+----------+---------------+ POP      Full           Yes      Yes                                  +---------+---------------+---------+-----------+----------+---------------+ PTV      Full                                                         +---------+---------------+---------+-----------+----------+---------------+  PERO                    Yes      Yes                  Patent by color +---------+---------------+---------+-----------+----------+---------------+     Summary: RIGHT: - No evidence of common femoral vein obstruction.  LEFT: - There is no evidence of deep vein thrombosis in the lower extremity. However, portions of this examination were limited- see technologist comments above.  - No cystic structure found in the popliteal fossa.  *See table(s) above for measurements and observations. Electronically signed by Harold Barban MD on 02/25/2020 at 8:54:52 PM.    Final    [place] EKG SITE RITE  Result Date: 02/25/2020 If Site Rite image not attached, placement could not be confirmed due to current cardiac rhythm.

## 2020-02-27 NOTE — Progress Notes (Signed)
Patient remains stable, no urine output for 12 hour shift, bladder scan revealed 12ml. Hospitalist aware, at time labs had not resulted at time we spoke. No c/o of pain. Patient wore bipap all night.

## 2020-02-27 NOTE — Progress Notes (Signed)
Called by RN at [phone] and notified that pt has had no urine output for entire overnight shift. Bladder scan done and shows 65 ml in bladder. Pt had low urine output yesterday and had renal u/s which is reviewed.  Labs have been drawn this am but have not resulted yet.  Pt without complaint of pain in lower abdomen, pelvis, flank region.  Day team to assess on rounds

## 2020-02-27 NOTE — TOC Initial Note (Signed)
Transition of Care [facility]) - Initial/Assessment Note    Patient Details  Name: Morgan Beasley MRN: [id number] Date of Birth: 1960-12-31  Transition [facility] Care [facility]) CM/SW Contact:    Morgan Chars, LCSW Phone Number: 02/27/2020, 11:27 AM  Clinical Narrative:      CSW met with pt to discuss discharge plan.  Pt agrees to SNF, unclear as to level of comprehension.  Permission given to speak with daughter Morgan Beasley.  Daughter also agreeable to SNF plan.  Pt is vaccinated.  Choice document placed in pt room and explained to daughter who will be here tonight to visit and will look at it then.              Expected Discharge Plan: Skilled Nursing Facility Barriers to Discharge: Continued Medical Work up, SNF Pending bed offer   Patient Goals and CMS Choice Patient states their goals for this hospitalization and ongoing recovery are:: I want to keep my foot CMS Medicare.gov Compare Post Acute Care list provided to:: Patient Choice offered to / list presented to : Patient  Expected Discharge Plan and Services Expected Discharge Plan: [place] Choice: [place] arrangements for the past 2 months: Single Family Home                                      Prior Living Arrangements/Services Living arrangements for the past 2 months: Single Family Home Lives with:: Adult Children Patient language and need for interpreter reviewed:: Yes Do you feel safe going back to the place where you live?: Yes      Need for Family Participation in Patient Care: Yes (Comment) Care giver support system in place?: Yes (comment)   Criminal Activity/Legal Involvement Pertinent to Current Situation/Hospitalization: No - Comment as needed  Activities of Daily Living Home Assistive Devices/Equipment: CBG Meter, Dentures (specify type), Walker (specify type) (upper/lower dentures, rollator) ADL Screening (condition at time of  admission) Patient's cognitive ability adequate to safely complete daily activities?: Yes Is the patient deaf or have difficulty hearing?: No Does the patient have difficulty seeing, even when wearing glasses/contacts?: No Does the patient have difficulty concentrating, remembering, or making decisions?: No Patient able to express need for assistance with ADLs?: Yes Does the patient have difficulty dressing or bathing?: Yes Independently performs ADLs?: No Communication: Independent Dressing (OT): Needs assistance Is this a change from baseline?: Change from baseline, expected to last >3 days Grooming: Needs assistance Is this a change from baseline?: Change from baseline, expected to last >3 days Feeding: Needs assistance Is this a change from baseline?: Change from baseline, expected to last >3 days Bathing: Needs assistance Is this a change from baseline?: Change from baseline, expected to last >3 days Toileting: Needs assistance Is this a change from baseline?: Change from baseline, expected to last >3days In/Out Bed: Needs assistance Is this a change from baseline?: Pre-admission baseline Walks in Home: Needs assistance Is this a change from baseline?: Pre-admission baseline Does the patient have difficulty walking or climbing stairs?: Yes (secondary to weakness) Weakness of Legs: Both Weakness of Arms/Hands: None  Permission Sought/Granted Permission sought to share information with : Facility Sport and exercise psychologist, Family Supports Permission granted to share information with : Yes, Verbal Permission Granted  Share Information with NAME: daughter Morgan Beasley  Permission granted to share info w AGENCY: SNF  Emotional Assessment Appearance:: Appears older than stated age Attitude/Demeanor/Rapport: Engaged Affect (typically observed): Afraid/Fearful Orientation: : Oriented to Self Alcohol / Substance Use: Not Applicable Psych Involvement: No (comment)  Admission  diagnosis:  Septic shock ([place]) [A41.9, R65.21] Pain of left lower extremity [M79.605] Ulcer of left foot, unspecified ulcer stage (HCC) [L97.529] Sepsis, due to unspecified organism, unspecified whether acute organ dysfunction present [facility]) [A41.9] Patient Active Problem List   Diagnosis Date Noted  . Septic shock ([place]) 02/23/2020  . Chronic systolic CHF (congestive heart failure) ([place]) 02/23/2020  . Obesity (BMI 30-39.9) 02/23/2020  . Ischemia of left lower extremity 10/09/2019  . PVD (peripheral vascular disease) ([place])   . Diabetic peripheral neuropathy associated with type 2 diabetes mellitus ([place])   . Acute on chronic diastolic congestive heart failure ([place])   . Lower extremity edema   . Dyspnea 09/21/2019  . Bronchitis 02/25/2019  . Uncontrolled diabetes mellitus with circulatory complication, with long-term current use of insulin ([place]) 02/25/2019  . Peripheral neuropathy 02/25/2019  . COPD (chronic obstructive pulmonary disease) ([place]) 02/25/2019  . Malignant neoplasm of upper-outer quadrant of right breast in female, estrogen receptor positive ([place]) 01/08/2019   PCP:  Morgan Sheer, NP Pharmacy:   [place], [place] - [address] [place] [address] Phone: [phone] Fax: [phone]     Social Determinants of Health (SDOH) Interventions    Readmission Risk Interventions Readmission Risk Prevention Plan 10/21/2019  Transportation Screening Complete  Medication Review ([place]) Complete  PCP or Specialist appointment within 3-5 days of discharge Complete  HRI or [place] Complete  SW Recovery Care/Counseling Consult Complete  [place] Not Applicable  Some recent data might be hidden

## 2020-02-27 NOTE — Progress Notes (Signed)
  Progress Note    02/27/2020 8:10 AM * No surgery found *  Subjective:  Sleepy this morning. Still with left leg pain   Vitals:   02/27/20 0329 02/27/20 0747  BP: 98/72 105/64  Pulse: 79   Resp: 18   Temp: 98.5 F (36.9 C) 97.6 F (36.4 C)  SpO2: 99%    Physical Exam: Cardiac: regular Lungs: non labored Extremities:  Left femoral pulse 2 +, left popliteal pulse 2+, left posterior tibial pulse palpable. Left foot warm    Abdomen:  Obese, non tender Neurologic: alert and oriented  CBC    Component Value Date/Time   WBC 7.7 02/27/2020 0500   RBC 4.26 02/27/2020 0500   HGB 10.6 (L) 02/27/2020 0500   HGB 12.0 11/17/2019 1449   HCT 35.0 (L) 02/27/2020 0500   HCT 37.8 11/17/2019 1449   PLT 371 02/27/2020 0500   PLT 431 11/17/2019 1449   MCV 82.2 02/27/2020 0500   MCV 83 11/17/2019 1449   MCH 24.9 (L) 02/27/2020 0500   MCHC 30.3 02/27/2020 0500   RDW 18.7 (H) 02/27/2020 0500   RDW 17.5 (H) 11/17/2019 1449   LYMPHSABS 1.6 02/27/2020 0500   MONOABS 0.8 02/27/2020 0500   EOSABS 0.1 02/27/2020 0500   BASOSABS 0.0 02/27/2020 0500    BMET    Component Value Date/Time   NA 133 (L) 02/27/2020 0500   NA 137 02/09/2020 0825   K 4.1 02/27/2020 0500   CL 101 02/27/2020 0500   CO2 22 02/27/2020 0500   GLUCOSE 164 (H) 02/27/2020 0500   BUN 39 (H) 02/27/2020 0500   BUN 14 02/09/2020 0825   CREATININE 2.88 (H) 02/27/2020 0500   CALCIUM 7.7 (L) 02/27/2020 0500   GFRNONAA 18 (L) 02/27/2020 0500   GFRAA 65 02/09/2020 0825    INR    Component Value Date/Time   INR 1.7 (H) 02/26/2020 0343     Intake/Output Summary (Last 24 hours) at 02/27/2020 0810 Last data filed at 02/26/2020 1736 Gross per 24 hour  Intake 1010 ml  Output 500 ml  Net 510 ml     Assessment/Plan:  59 y.o. female with cellulitis of left leg with non healing left foot wound. Left leg is well perfused and warm. Per CTA she has adequate flow into left foot although heavily Calficied. Wound  appears somewhat better today. Scr continues to rise 2.88 today. Anuric over night. Afebrile. No leukocytosis. Remains on Cefepime. Continue local left foot wound care with santyl   Karoline Caldwell, PA-C [facility] [phone] 02/27/2020 8:10 AM

## 2020-02-27 NOTE — Progress Notes (Signed)
Physical Therapy Treatment Patient Details Name: Morgan Beasley MRN: [id number] DOB: [birth year] Today's Date: 02/27/2020    History of Present Illness Pt is a 59 y.o. female admitted 02/23/20 with LLE pain; lab work consistent with sepsis. Also with LLE cellulitis. PMH includes CHF, CKD III, COPD, DM, neuropathy, HTN, CAD, OSA, PAD, tobacco use, alcohol dependence, anxiety, gout, breast CA, R foot ulcer, LLE vascular intervention in 2021 (most recently 01/26/20).    PT Comments    Pt lethargic initially with decreased cognition and orientation which improved during session with maintained need for increased time with all transfers and cues. Pt able to get to EOB, stand, walk in room and brush mouth at sink. Pt preoccupied with back itching and lotion applied. Pt with limited cognition, balance, strength, gait who may be able to progress toward HHPT if able to continue to improved with all functions and has family assist otherwise SNF needed.     Follow Up Recommendations  SNF;Supervision/Assistance - 24 hour     Equipment Recommendations  3in1 (PT)    Recommendations for Other Services       Precautions / Restrictions Precautions Precautions: Fall Precaution Comments: left foot wound    Mobility  Bed Mobility Overal bed mobility: Needs Assistance Bed Mobility: Rolling;Sidelying to Sit Rolling: Mod assist Sidelying to sit: Mod assist       General bed mobility comments: cues for sequence, increased time to reach for rail, move legs toward EOB and elevate trunk from surface. mod assist to complete scooting hips to EOB  Transfers Overall transfer level: Needs assistance   Transfers: Sit to/from Stand Sit to Stand: Min assist         General transfer comment: min assist to rise from bed to rollator, sit on rollator and rise from rollator with cues for hand placement, safety and sequence. Increased time to process and complete  transfers  Ambulation/Gait Ambulation/Gait assistance: Min guard;+2 safety/equipment Gait Distance (Feet): 15 Feet Assistive device: 4-wheeled walker Gait Pattern/deviations: Step-through pattern;Decreased stride length;Trunk flexed   Gait velocity interpretation: 1.31 - 2.62 ft/sec, indicative of limited community ambulator General Gait Details: cues for posture, safety with assist for balance and lines. Pt walked 5' then 15' with seated rest   Stairs             Wheelchair Mobility    Modified Rankin (Stroke Patients Only)       Balance Overall balance assessment: Needs assistance   Sitting balance-Leahy Scale: Fair Sitting balance - Comments: pt able to sit EOB with supervision   Standing balance support: Bilateral upper extremity supported Standing balance-Leahy Scale: Poor Standing balance comment: bil UE support on rollator during standing                            Cognition Arousal/Alertness: Awake/alert Behavior During Therapy: Flat affect Overall Cognitive Status: Impaired/Different from baseline Area of Impairment: Orientation;Following commands;Problem solving;Awareness;Memory;Attention;Safety/judgement                 Orientation Level: Disoriented to;Time Current Attention Level: Sustained Memory: Decreased short-term memory Following Commands: Follows one step commands inconsistently;Follows one step commands with increased time Safety/Judgement: Decreased awareness of safety;Decreased awareness of deficits Awareness: Emergent Problem Solving: Slow processing;Decreased initiation;Difficulty sequencing;Requires verbal cues;Requires tactile cues General Comments: pt initially drowsy with decreased awareness and responsiveness which improved with transition to sitting and standing      Exercises      General Comments  Pertinent Vitals/Pain Pain Score: 3  Pain Location: LLE Pain Descriptors / Indicators: Sore Pain  Intervention(s): Limited activity within patient's tolerance;Monitored during session;Repositioned    Home Living                      Prior Function            PT Goals (current goals can now be found in the care plan section) Progress towards PT goals: Progressing toward goals    Frequency    Min 3X/week      PT Plan Current plan remains appropriate;Frequency needs to be updated    Co-evaluation              AM-PAC PT "6 Clicks" Mobility   Outcome Measure  Help needed turning from your back to your side while in a flat bed without using bedrails?: A Lot Help needed moving from lying on your back to sitting on the side of a flat bed without using bedrails?: A Lot Help needed moving to and from a bed to a chair (including a wheelchair)?: A Little Help needed standing up from a chair using your arms (e.g., wheelchair or bedside chair)?: A Little Help needed to walk in hospital room?: A Little Help needed climbing 3-5 steps with a railing? : A Lot 6 Click Score: 15    End of Session Equipment Utilized During Treatment: Gait belt Activity Tolerance: Patient tolerated treatment well Patient left: in chair;with call bell/phone within reach;with chair alarm set;with nursing/sitter in room Nurse Communication: Mobility status PT Visit Diagnosis: Other abnormalities of gait and mobility (R26.89);Pain;Muscle weakness (generalized) (M62.81)     Time: 2820-6015 PT Time Calculation (min) (ACUTE ONLY): 36 min  Charges:  $Gait Training: 8-22 mins $Therapeutic Activity: 8-22 mins                     Palin Tristan P, PT Acute Rehabilitation Services Pager: [phone] Office: [place] 02/27/2020, 12:40 PM

## 2020-02-27 NOTE — Progress Notes (Signed)
Pt placed on CPAP (BIPAP mode) pt resting well at this moment.

## 2020-02-28 ENCOUNTER — Inpatient Hospital Stay (HOSPITAL_COMMUNITY): Payer: Medicare Other

## 2020-02-28 DIAGNOSIS — R6521 Severe sepsis with septic shock: Secondary | ICD-10-CM | POA: Diagnosis not present

## 2020-02-28 DIAGNOSIS — A419 Sepsis, unspecified organism: Secondary | ICD-10-CM | POA: Diagnosis not present

## 2020-02-28 LAB — GLUCOSE, CAPILLARY
Glucose-Capillary: 101 mg/dL — ABNORMAL HIGH (ref 70–99)
Glucose-Capillary: 116 mg/dL — ABNORMAL HIGH (ref 70–99)
Glucose-Capillary: 127 mg/dL — ABNORMAL HIGH (ref 70–99)
Glucose-Capillary: 50 mg/dL — ABNORMAL LOW (ref 70–99)
Glucose-Capillary: 72 mg/dL (ref 70–99)
Glucose-Capillary: 74 mg/dL (ref 70–99)
Glucose-Capillary: 74 mg/dL (ref 70–99)
Glucose-Capillary: 77 mg/dL (ref 70–99)
Glucose-Capillary: 98 mg/dL (ref 70–99)
Glucose-Capillary: 98 mg/dL (ref 70–99)

## 2020-02-28 LAB — CBC WITH DIFFERENTIAL/PLATELET
Abs Immature Granulocytes: 0.05 10*3/uL (ref 0.00–0.07)
Basophils Absolute: 0 10*3/uL (ref 0.0–0.1)
Basophils Relative: 0 %
Eosinophils Absolute: 0.1 10*3/uL (ref 0.0–0.5)
Eosinophils Relative: 2 %
HCT: 28.2 % — ABNORMAL LOW (ref 36.0–46.0)
Hemoglobin: 8.5 g/dL — ABNORMAL LOW (ref 12.0–15.0)
Immature Granulocytes: 1 %
Lymphocytes Relative: 17 %
Lymphs Abs: 1.2 10*3/uL (ref 0.7–4.0)
MCH: 25 pg — ABNORMAL LOW (ref 26.0–34.0)
MCHC: 30.1 g/dL (ref 30.0–36.0)
MCV: 82.9 fL (ref 80.0–100.0)
Monocytes Absolute: 0.9 10*3/uL (ref 0.1–1.0)
Monocytes Relative: 13 %
Neutro Abs: 4.9 10*3/uL (ref 1.7–7.7)
Neutrophils Relative %: 67 %
Platelets: 319 10*3/uL (ref 150–400)
RBC: 3.4 MIL/uL — ABNORMAL LOW (ref 3.87–5.11)
RDW: 18.8 % — ABNORMAL HIGH (ref 11.5–15.5)
WBC: 7.3 10*3/uL (ref 4.0–10.5)
nRBC: 1.6 % — ABNORMAL HIGH (ref 0.0–0.2)

## 2020-02-28 LAB — COMPREHENSIVE METABOLIC PANEL
ALT: 235 U/L — ABNORMAL HIGH (ref 0–44)
AST: 829 U/L — ABNORMAL HIGH (ref 15–41)
Albumin: 1.1 g/dL — ABNORMAL LOW (ref 3.5–5.0)
Alkaline Phosphatase: 181 U/L — ABNORMAL HIGH (ref 38–126)
Anion gap: 0 — ABNORMAL LOW (ref 5–15)
BUN: 33 mg/dL — ABNORMAL HIGH (ref 6–20)
CO2: 18 mmol/L — ABNORMAL LOW (ref 22–32)
Calcium: 5.8 mg/dL — CL (ref 8.9–10.3)
Chloride: 122 mmol/L — ABNORMAL HIGH (ref 98–111)
Creatinine, Ser: 2.38 mg/dL — ABNORMAL HIGH (ref 0.44–1.00)
GFR, Estimated: 23 mL/min — ABNORMAL LOW (ref >60)
Glucose, Bld: 121 mg/dL — ABNORMAL HIGH (ref 70–99)
Potassium: 2.9 mmol/L — ABNORMAL LOW (ref 3.5–5.1)
Sodium: 134 mmol/L — ABNORMAL LOW (ref 135–145)
Total Bilirubin: 0.6 mg/dL (ref 0.3–1.2)
Total Protein: 5.4 g/dL — ABNORMAL LOW (ref 6.5–8.1)

## 2020-02-28 LAB — CULTURE, BLOOD (ROUTINE X 2): Culture: NO GROWTH

## 2020-02-28 LAB — BRAIN NATRIURETIC PEPTIDE: B Natriuretic Peptide: 510 pg/mL — ABNORMAL HIGH (ref 0.0–100.0)

## 2020-02-28 LAB — ACETAMINOPHEN LEVEL: Acetaminophen (Tylenol), Serum: <10 ug/mL — ABNORMAL LOW (ref 10–30)

## 2020-02-28 LAB — PROTIME-INR
INR: 3.2 — ABNORMAL HIGH (ref 0.8–1.2)
Prothrombin Time: 31.4 s — ABNORMAL HIGH (ref 11.4–15.2)

## 2020-02-28 LAB — HEPATITIS PANEL, ACUTE
HCV Ab: NONREACTIVE
Hep A IgM: NONREACTIVE
Hep B C IgM: NONREACTIVE
Hepatitis B Surface Ag: NONREACTIVE

## 2020-02-28 LAB — MAGNESIUM: Magnesium: 1.4 mg/dL — ABNORMAL LOW (ref 1.7–2.4)

## 2020-02-28 MED ORDER — DEXTROSE 50 % IV SOLN
1.0000 | Freq: Once | INTRAVENOUS | Status: AC
  Administered 2020-02-28: 50 mL via INTRAVENOUS

## 2020-02-28 MED ORDER — POTASSIUM CHLORIDE 2 MEQ/ML IV SOLN
INTRAVENOUS | Status: DC
  Filled 2020-02-28 (×2): qty 1000

## 2020-02-28 MED ORDER — VITAMIN K1 10 MG/ML IJ SOLN
2.0000 mg | Freq: Once | INTRAVENOUS | Status: AC
  Administered 2020-02-28: 2 mg via INTRAVENOUS
  Filled 2020-02-28: qty 0.2

## 2020-02-28 MED ORDER — DEXTROSE-NACL 5-0.45 % IV SOLN: INTRAVENOUS | Status: DC

## 2020-02-28 MED ORDER — INSULIN GLARGINE 100 UNIT/ML ~~LOC~~ SOLN
30.0000 [IU] | Freq: Every day | SUBCUTANEOUS | Status: DC
  Filled 2020-02-28: qty 0.3

## 2020-02-28 MED ORDER — INSULIN ASPART 100 UNIT/ML ~~LOC~~ SOLN
0.0000 [IU] | Freq: Four times a day (QID) | SUBCUTANEOUS | Status: DC
  Administered 2020-03-01 – 2020-03-02 (×2): 1 [IU] via SUBCUTANEOUS
  Administered 2020-03-02: 3 [IU] via SUBCUTANEOUS
  Administered 2020-03-02: 5 [IU] via SUBCUTANEOUS
  Administered 2020-03-03: 1 [IU] via SUBCUTANEOUS
  Administered 2020-03-03: 3 [IU] via SUBCUTANEOUS

## 2020-02-28 MED ORDER — DEXTROSE 50 % IV SOLN
25.0000 g | INTRAVENOUS | Status: AC
  Administered 2020-02-28: 25 g via INTRAVENOUS

## 2020-02-28 MED ORDER — HEPARIN SODIUM (PORCINE) 5000 UNIT/ML IJ SOLN
5000.0000 [IU] | Freq: Three times a day (TID) | INTRAMUSCULAR | Status: DC
  Administered 2020-03-01 – 2020-03-18 (×39): 5000 [IU] via SUBCUTANEOUS
  Filled 2020-02-28 (×44): qty 1

## 2020-02-28 MED ORDER — POTASSIUM CHLORIDE CRYS ER 20 MEQ PO TBCR
40.0000 meq | EXTENDED_RELEASE_TABLET | Freq: Once | ORAL | Status: AC
  Administered 2020-02-28: 40 meq via ORAL
  Filled 2020-02-28: qty 2

## 2020-02-28 MED ORDER — MAGNESIUM SULFATE 2 GM/50ML IV SOLN
2.0000 g | Freq: Once | INTRAVENOUS | Status: AC
  Administered 2020-02-28: 2 g via INTRAVENOUS
  Filled 2020-02-28: qty 50

## 2020-02-28 MED ORDER — DEXTROSE IN LACTATED RINGERS 5 % IV SOLN: INTRAVENOUS | Status: DC

## 2020-02-28 NOTE — TOC Progression Note (Signed)
Transition of Care [facility]) - Progression Note    Patient Details  Name: Morgan Beasley MRN: [id number] Date of Birth: 22-Apr-1961  Transition of Care [facility]) CM/SW [place], [place] Phone Number: 02/28/2020, 11:05 AM  Clinical Narrative:    CSW reached out to multiple SNFs to ask for review referrals sent out for pt. No response as of yet.    Expected Discharge Plan: [place] Barriers to Discharge: Continued Medical Work up, SNF Pending bed offer  Expected Discharge Plan and Services Expected Discharge Plan: [place] Choice: [place] arrangements for the past 2 months: Single Family Home                                       Social Determinants of Health (SDOH) Interventions    Readmission Risk Interventions Readmission Risk Prevention Plan 10/21/2019  Transportation Screening Complete  Medication Review Press photographer) Complete  PCP or Specialist appointment within 3-5 days of discharge Complete  HRI or [place] Complete  SW Recovery Care/Counseling Consult Complete  [place] Not Applicable  Some recent data might be hidden

## 2020-02-28 NOTE — Progress Notes (Signed)
   VASCULAR SURGERY ASSESSMENT & PLAN:   PVD ULCER: The ulcer on the left foot has not changed significantly.  There is a photograph below.  Patient has had previous left femoropopliteal bypass and laser angioplasty on the left leg, all of which remain patent.  Dr. Donzetta Matters will follow up next week and will likely simply follow this wound as an outpatient given her other medical issues.  SUBJECTIVE:   The patient has no specific complaints.  Her nurse tells me that she is going for cholecystectomy today.  PHYSICAL EXAM:   Vitals:   02/27/20 2129 02/27/20 2219 02/28/20 0015 02/28/20 0331  BP: 109/69 110/85 108/86 (!) 85/75  Pulse: 78 80 79 83  Resp: 18 18 18 20   Temp: 98.6 F (37 C)  97.6 F (36.4 C) 98.3 F (36.8 C)  TempSrc: Oral  Axillary Oral  SpO2:  97% 97% 100%  Weight:      Height:       Palpable left posterior tibial pulse. The wound on the left foot is unchanged.     LABS:   Lab Results  Component Value Date   WBC 7.3 02/28/2020   HGB 8.5 (L) 02/28/2020   HCT 28.2 (L) 02/28/2020   MCV 82.9 02/28/2020   PLT 319 02/28/2020   Lab Results  Component Value Date   CREATININE 2.38 (H) 02/28/2020   Lab Results  Component Value Date   INR 3.2 (H) 02/28/2020   CBG (last 3)  Recent Labs    02/28/20 0037 02/28/20 0209 02/28/20 0431  GLUCAP 77 116* 74    PROBLEM LIST:    Principal Problem:   Septic shock (HCC) Active Problems:   Uncontrolled diabetes mellitus with circulatory complication, with long-term current use of insulin (HCC)   COPD (chronic obstructive pulmonary disease) (HCC)   PVD (peripheral vascular disease) (HCC)   Ischemia of left lower extremity   Chronic systolic CHF (congestive heart failure) (HCC)   Obesity (BMI 30-39.9)   CURRENT MEDS:   . amitriptyline  200 mg Oral QHS  . Chlorhexidine Gluconate Cloth  6 each Topical Daily  . clopidogrel  75 mg Oral Daily  . collagenase   Topical Daily  . ezetimibe  10 mg Oral QPC supper  .  fluticasone furoate-vilanterol  1 puff Inhalation Daily  . gabapentin  800 mg Oral TID  . [START ON 03/01/2020] heparin injection (subcutaneous)  5,000 Units Subcutaneous Q8H  . insulin aspart  0-15 Units Subcutaneous TID WC  . insulin aspart  0-5 Units Subcutaneous QHS  . insulin glargine  30 Units Subcutaneous QHS  . lipase/protease/amylase  72,000 Units Oral TID WC  . metroNIDAZOLE  500 mg Oral Q8H  . naLOXone (NARCAN)  injection  0.1 mg Intravenous Once  . pantoprazole  40 mg Oral Daily  . potassium chloride  40 mEq Oral Once    Deitra Mayo Office: [phone] 02/28/2020

## 2020-02-28 NOTE — NC FL2 (Signed)
[place] LEVEL OF CARE SCREENING TOOL     IDENTIFICATION  Patient Name: Morgan Beasley Birthdate: October 09, 1960 Sex: female Admission Date (Current Location): 02/23/2020  [place] and [place] Number:  [name] [id number] [place] and Address:  [facility] [place] [address] [place], [place] [address]      Provider Number: [id number]  Attending Physician Name and Address:  Thurnell Lose, MD  Relative Name and Phone Number:  [name] Daughter   [phone]    Current Level of Care: Hospital Recommended Level of Care: [place] Prior Approval Number:    Date Approved/Denied:   PASRR Number: [id number]  Discharge Plan: SNF    Current Diagnoses: Patient Active Problem List   Diagnosis Date Noted  . Septic shock ([place]) 02/23/2020  . Chronic systolic CHF (congestive heart failure) ([place]) 02/23/2020  . Obesity (BMI 30-39.9) 02/23/2020  . Ischemia of left lower extremity 10/09/2019  . PVD (peripheral vascular disease) ([place])   . Diabetic peripheral neuropathy associated with type 2 diabetes mellitus ([place])   . Acute on chronic diastolic congestive heart failure ([place])   . Lower extremity edema   . Dyspnea 09/21/2019  . Bronchitis 02/25/2019  . Uncontrolled diabetes mellitus with circulatory complication, with long-term current use of insulin ([place]) 02/25/2019  . Peripheral neuropathy 02/25/2019  . COPD (chronic obstructive pulmonary disease) ([place]) 02/25/2019  . Malignant neoplasm of upper-outer quadrant of right breast in female, estrogen receptor positive ([place]) 01/08/2019    Orientation RESPIRATION BLADDER Height & Weight     Self, Situation, Place  O2 Incontinent Weight: 199 lb (90.3 kg) Height:  5\' 6"  (167.6 cm)  BEHAVIORAL SYMPTOMS/MOOD NEUROLOGICAL BOWEL NUTRITION STATUS      Continent Diet (See discharge summary)  AMBULATORY STATUS COMMUNICATION OF NEEDS Skin   Total Care Verbally Skin abrasions                        Personal Care Assistance Level of Assistance  Bathing, Feeding, Dressing Bathing Assistance: Maximum assistance Feeding assistance: Maximum assistance Dressing Assistance: Maximum assistance     Functional Limitations Info  Sight, Hearing, Speech Sight Info: Adequate Hearing Info: Adequate Speech Info: Adequate    SPECIAL CARE FACTORS FREQUENCY  PT (By licensed PT), OT (By licensed OT)     PT Frequency: 5x week OT Frequency: 5x week            Contractures Contractures Info: Not present    Additional Factors Info  Code Status, Allergies Code Status Info: full Allergies Info: Tramadol, Nsaids, Tolmetin, Tylenol Acetaminophen, Aspirin           Current Medications (02/28/2020):  This is the current hospital active medication list Current Facility-Administered Medications  Medication Dose Route Frequency Provider Last Rate Last Admin  . acetaminophen (TYLENOL) tablet 650 mg  650 mg Oral Q6H PRN Annita Brod, MD   650 mg at 02/26/20 0826  . amitriptyline (ELAVIL) tablet 200 mg  200 mg Oral QHS Annita Brod, MD   200 mg at 02/24/20 2126  . ceFEPIme (MAXIPIME) 2 g in sodium chloride 0.9 % 100 mL IVPB  2 g Intravenous Q24H Susa Raring, RPH 200 mL/hr at 02/28/20 0529 2 g at 02/28/20 0529  . Chlorhexidine Gluconate Cloth 2 % PADS 6 each  6 each Topical Daily Thurnell Lose, MD   6 each at 02/27/20 0827  . clopidogrel (PLAVIX) tablet 75 mg  75 mg Oral Daily Candiss Norse,  Margaree Mackintosh, MD   75 mg at 02/27/20 0803  . collagenase (SANTYL) ointment   Topical Daily Thurnell Lose, MD   Given at 02/27/20 [phone]  . dextrose 50 % solution 0-50 mL  0-50 mL Intravenous PRN Annita Brod, MD   50 mL at 02/28/20 0445  . ezetimibe (ZETIA) tablet 10 mg  10 mg Oral QPC supper Annita Brod, MD   10 mg at 02/27/20 1638  . fluticasone furoate-vilanterol (BREO ELLIPTA) 200-25 MCG/INH 1 puff  1 puff Inhalation Daily Thurnell Lose, MD   1 puff at  02/27/20 0805  . gabapentin (NEURONTIN) capsule 800 mg  800 mg Oral TID Annita Brod, MD   800 mg at 02/27/20 1638  . [START ON 03/01/2020] heparin injection 5,000 Units  5,000 Units Subcutaneous Q8H Thurnell Lose, MD      . HYDROcodone-acetaminophen (NORCO) 7.5-325 MG per tablet 1 tablet  1 tablet Oral Q6H PRN Thurnell Lose, MD   1 tablet at 02/27/20 1237  . insulin aspart (novoLOG) injection 0-15 Units  0-15 Units Subcutaneous TID WC Annita Brod, MD   2 Units at 02/27/20 1638  . insulin aspart (novoLOG) injection 0-5 Units  0-5 Units Subcutaneous QHS Annita Brod, MD   2 Units at 02/26/20 2117  . insulin glargine (LANTUS) injection 30 Units  30 Units Subcutaneous QHS Thurnell Lose, MD   30 Units at 02/26/20 2117  . lactated ringers 1,000 mL with potassium chloride 30 mEq/L IV infusion   Intravenous Continuous Lala Lund K, MD      . lactated ringers bolus 1,000 mL  1,000 mL Intravenous Once Thurnell Lose, MD   Stopped at 02/26/20 1041  . lipase/protease/amylase (CREON) capsule 36,000 Units  36,000 Units Oral BID BM PRN Annita Brod, MD      . lipase/protease/amylase (CREON) capsule 72,000 Units  72,000 Units Oral TID WC Annita Brod, MD   72,000 Units at 02/27/20 1639  . magnesium sulfate IVPB 2 g 50 mL  2 g Intravenous Once Thurnell Lose, MD 50 mL/hr at 02/28/20 0743 2 g at 02/28/20 0743  . metroNIDAZOLE (FLAGYL) tablet 500 mg  500 mg Oral Q8H Susa Raring, RPH   500 mg at 02/27/20 1248  . naloxone [facility]) injection 0.1 mg  0.1 mg Intravenous Once Thurnell Lose, MD      . ondansetron [facility]) injection 4 mg  4 mg Intravenous Q6H PRN Annita Brod, MD   4 mg at 02/25/20 1343  . pantoprazole (PROTONIX) EC tablet 40 mg  40 mg Oral Daily Thurnell Lose, MD   40 mg at 02/27/20 0804  . phytonadione (VITAMIN K) 2 mg in dextrose 5 % 50 mL IVPB  2 mg Intravenous Once Lala Lund K, MD      . polyethylene glycol (MIRALAX /  GLYCOLAX) packet 17 g  17 g Oral Daily PRN Annita Brod, MD         Discharge Medications: Please see discharge summary for a list of discharge medications.  Relevant Imaging Results:  Relevant Lab Results:   Additional Information SSN [id number]  [name], LCSW

## 2020-02-28 NOTE — Consult Note (Signed)
[facility] Consult Note  [facility] Jan 09, 1961  [id number].    Requesting MD: Morgan Norse, MD Chief Complaint/Reason for Consult: possible calculous cholecystitis   HPI:  [name] is a 59 y/o F with a PMH obestity, OSA, CKD II, poorly controlled diabetes (A1c 15.1 this admission), CAD, systolic CHF (recent echo w/ EF 45%), HTN, and PVD/PAD s/p L femoropoliteal bypass and laser angioplasty LLE by Dr. Vella [facility] 10/14/19 who was admitted to [facility] for management of left foot/leg pain and chronic non-healing left foot ulcer with resultant serratia bacteremia and sepsis. On 02/27/20 she had an acute rise in her LFTs so RUQ ultrasound was ordered which showed gallbladder distention, sludge, pericholecystitis fluid, and "reportedly positive sonographic Murphy sign". General surgery has been asked to see for possible cholecystitis.   During my exam the patients daughter, whom she lives with, is at the bedside assisting with history. Patient and her daughter say that the patient has been having intermittent, lower abdominal pain at home for weeks. As a result the patient sometimes chooses not to eat as much. She denies obvious post-prandial pain, nausea, or emesis. Denies a known history of gallstones. Denies diarrhea, melena, hematochezia. At baseline she is in bed most of the day and uses a 4 wheel walker with a seat to mobilize to the bathroom. She is a current cigarette smoker and states she drinks about 2 beers per day. Denies marijuana or other drug use, other than pain medications that a prescribed to her. She denies use of home O2, has a CPAP at home.  Hepatic Function Latest Ref Rng & Units 02/28/2020 02/27/2020 02/26/2020  Total Protein 6.5 - 8.1 g/dL 5.4(L) 5.9(L) 5.9(L)  Albumin 3.5 - 5.0 g/dL 1.1(L) 1.6(L) 1.6(L)  AST 15 - 41 U/L 829(H) 920(H) 66(H)  ALT 0 - 44 U/L 235(H) 181(H) 24  Alk Phosphatase 38 - 126 U/L 181(H) 184(H) 137(H)  Total Bilirubin 0.3 -  1.2 mg/dL 0.6 0.4 0.7  Bilirubin, Direct 0.0 - 0.2 mg/dL - - -   ROS: Review of Systems  All other systems reviewed and are negative.  Family History  Problem Relation Age of Onset  . Diabetes Other   . Heart disease Other   . Breast cancer Maternal Grandmother   . Breast cancer Paternal Grandmother   . Stroke Son   . Colon cancer Neg Hx   . Esophageal cancer Neg Hx   . Rectal cancer Neg Hx   . Stomach cancer Neg Hx     Past Medical History:  Diagnosis Date  . Alcohol dependence ([facility])   . Anemia   . Anxiety   . Breast cancer ([place])   . Chronic combined systolic and diastolic CHF (congestive heart failure) ([facility])   . Cigarette nicotine dependence   . CKD (chronic kidney disease), stage III ([facility])   . Colon polyps   . COPD (chronic obstructive pulmonary disease) ([facility])   . Diabetes mellitus without complication ([place])   . Diabetic neuropathy ([facility])   . Elevated lipase   . Gout   . Hyperlipemia   . Hypertension   . Insomnia   . Lymphedema   . Marijuana use   . Mild CAD 2016   a. NSTEMI 2016 in context of cocaine abuse, 50% RCA% at that time.  . OSA treated with BiPAP   . PAD (peripheral artery disease) ([facility])    a. s/p L SFA stenting 08/2019. b. left fem-to-below-knee-popliteal bypass 09/2019.  Marland Kitchen Sleep apnea  wears BIPAP  . Ulcer of foot ([facility])    right    Past Surgical History:  Procedure Laterality Date  . ABDOMINAL AORTOGRAM W/LOWER EXTREMITY N/A 09/26/2019   Procedure: ABDOMINAL AORTOGRAM W/LOWER EXTREMITY;  Surgeon: Angelia Mould, MD;  [facility]: [facility];  Service: Cardiovascular;  Laterality: N/A;  . ABDOMINAL AORTOGRAM W/LOWER EXTREMITY Bilateral 12/08/2019   Procedure: ABDOMINAL AORTOGRAM W/LOWER EXTREMITY;  Surgeon: Waynetta Sandy, MD;  [facility];  Service: Cardiovascular;  Laterality: Bilateral;  . ABDOMINAL AORTOGRAM W/LOWER EXTREMITY Left 01/26/2020   Procedure: ABDOMINAL AORTOGRAM W/LOWER EXTREMITY;  Surgeon:  Waynetta Sandy, MD;  Location: [facility];  Service: Cardiovascular;  Laterality: Left;  . ABDOMINAL HYSTERECTOMY    . [facility]  . FEMORAL-POPLITEAL BYPASS GRAFT Left 10/14/2019   Procedure: BYPASS GRAFT LEFT FEMORAL-POPLITEAL ARTERY USING NONREVERSED SAPHENOUS VEIN;  Surgeon: Waynetta Sandy, MD;  [facility];  Service: Vascular;  Laterality: Left;  Marland Kitchen MASTECTOMY Right April 2016  . PERIPHERAL VASCULAR ATHERECTOMY Left 01/26/2020   Procedure: PERIPHERAL VASCULAR ATHERECTOMY;  Surgeon: Waynetta Sandy, MD;  [facility];  Service: Cardiovascular;  Laterality: Left;  PT and AT - Laser  . PERIPHERAL VASCULAR BALLOON ANGIOPLASTY Left 01/26/2020   Procedure: PERIPHERAL VASCULAR BALLOON ANGIOPLASTY;  Surgeon: Waynetta Sandy, MD;  Location: [facility];  Service: Cardiovascular;  Laterality: Left;  TP Trunk   . PERIPHERAL VASCULAR INTERVENTION Left 09/26/2019   Procedure: PERIPHERAL VASCULAR INTERVENTION;  Surgeon: Angelia Mould, MD;  Location: [facility];  Service: Cardiovascular;  Laterality: Left;  superficial femoral    Social History:  reports that she has been smoking cigarettes. She has never used smokeless tobacco. She reports previous alcohol use. She reports previous drug use. Drug: Marijuana.  Allergies:  Allergies  Allergen Reactions  . Tramadol Swelling  . Nsaids Other (See Comments)    Pancreatitis  . Tolmetin Other (See Comments)    Pancreatitis  . Tylenol [Acetaminophen] Other (See Comments)    unknown  . Aspirin Other (See Comments)    "Makes my pancreas act up"     Medications Prior to Admission  Medication Sig Dispense Refill  . albuterol (PROAIR HFA) 108 (90 Base) MCG/ACT inhaler Inhale 2 puffs into the lungs every 6 (six) hours as needed for wheezing or shortness of breath.     Marland Kitchen amitriptyline (ELAVIL) 100 MG tablet Take 100 mg by mouth at bedtime.     .  budesonide-formoterol (SYMBICORT) 160-4.5 MCG/ACT inhaler Inhale 2 puffs into the lungs 2 (two) times daily.    . clopidogrel (PLAVIX) 75 MG tablet Take 1 tablet (75 mg total) by mouth daily. 30 tablet 11  . dapagliflozin propanediol (FARXIGA) 10 MG TABS tablet Take 1 tablet (10 mg total) by mouth daily before breakfast. 30 tablet 2  . ezetimibe (ZETIA) 10 MG tablet Take 1 tablet (10 mg total) by mouth daily. Take with largest meal of the day 90 tablet 3  . furosemide (LASIX) 40 MG tablet Take 40-80 mg by mouth daily.    Marland Kitchen gabapentin (NEURONTIN) 800 MG tablet Take 800 mg by mouth 3 (three) times daily.    Marland Kitchen glimepiride (AMARYL) 4 MG tablet Take 8 mg by mouth daily with breakfast.     . insulin glargine (LANTUS) 100 UNIT/ML injection Inject 0.4 mLs (40 Units total) into the skin daily. (Patient taking differently: Inject 80 Units into the skin  daily. ) 10 mL 11  . insulin lispro (HUMALOG) 100 UNIT/ML injection Inject 14 Units into the skin 3 (three) times daily before meals.     . lipase/protease/amylase (CREON) 36000 UNITS CPEP capsule Take 2 capsule by mouth with each meal and 1 capsule by mouth with each snack (Patient taking differently: Take 36,000-72,000 Units by mouth See admin instructions. Take 72000 units by mouth with each meal and 36000 units by mouth with each snack) 240 capsule 3  . losartan (COZAAR) 50 MG tablet Take 50 mg by mouth daily.    . metoprolol succinate (TOPROL-XL) 50 MG 24 hr tablet Take 1 tablet (50 mg total) by mouth daily. Take with or immediately following a meal. (Patient taking differently: Take 50 mg by mouth in the morning and at bedtime. ) 90 tablet 3  . Multiple Vitamins-Minerals (ADULT ONE DAILY GUMMIES) CHEW Chew 1 capsule by mouth daily.    Marland Kitchen NARCAN 4 MG/0.1ML LIQD nasal spray kit Place 1 spray into the nose as needed (opioid overdose).     . potassium chloride (K-DUR) 10 MEQ tablet Take 10 mEq by mouth daily as needed (For cramps).     . rosuvastatin (CRESTOR)  40 MG tablet Take 1 tablet (40 mg total) by mouth daily. 90 tablet 3  . Toremifene Citrate (FARESTON) 60 MG tablet Take 1 tablet (60 mg total) by mouth daily. (Patient not taking: Reported on 01/23/2020) 30 tablet 0  . torsemide (DEMADEX) 20 MG tablet Take 2 tablets (40 mg total) by mouth daily. 60 tablet 0    Blood pressure (!) 112/91, pulse 83, temperature 98.2 F (36.8 C), temperature source Oral, resp. rate 16, height 5' 6"  (1.676 m), weight 90.3 kg, SpO2 100 %. Physical Exam: Constitutional: obese female who appears older than stated age, somnolent but arouses to voice Eyes: Moist conjunctiva; no lid lag; anicteric; PERRL Neck: Trachea midline; no thyromegaly Lungs: Normal respiratory effort on nasal cannula; no tactile fremitus CV: RRR; no palpable thrills; no pitting edema GI: Abd soft, obese, minimal RUQ tenderness to deep palpation without murphy's sign, overall non-tender, no peritonitis MSK: symmetrical, no clubbing/cyanosis Psychiatric: Appropriate affect;  oriented x3 Lymphatic: No palpable cervical or axillary lymphadenopathy  Results for orders placed or performed during the [facility] encounter of 02/23/20 (from the past 48 hour(s))  Osmolality     Status: Abnormal   Collection Time: 02/26/20 10:04 AM  Result Value Ref Range   Osmolality 297 (H) 275 - 295 mOsm/kg    Comment: REPEATED TO VERIFY Performed at [facility], [address] [place], [place] [address]   Uric acid     Status: Abnormal   Collection Time: 02/26/20 10:04 AM  Result Value Ref Range   Uric Acid, Serum 8.4 (H) 2.5 - 7.1 mg/dL    Comment: Performed at [facility] [address], [place], [place] [address]  Glucose, capillary     Status: Abnormal   Collection Time: 02/26/20 12:25 PM  Result Value Ref Range   Glucose-Capillary 124 (H) 70 - 99 mg/dL    Comment: Glucose reference range applies only to samples taken after fasting for at least 8 hours.  Glucose, capillary     Status:  Abnormal   Collection Time: 02/26/20  4:18 PM  Result Value Ref Range   Glucose-Capillary 208 (H) 70 - 99 mg/dL    Comment: Glucose reference range applies only to samples taken after fasting for at least 8 hours.  Creatinine, urine, random  Status: None   Collection Time: 02/26/20  5:22 PM  Result Value Ref Range   Creatinine, Urine 183.87 mg/dL    Comment: Performed at [facility] [address] [place], [place] [address]  Sodium, urine, random     Status: None   Collection Time: 02/26/20  5:22 PM  Result Value Ref Range   Sodium, Ur <10 mmol/L    Comment: Performed at [facility] [address], [place], [place] [address]  Osmolality, urine     Status: None   Collection Time: 02/26/20  5:22 PM  Result Value Ref Range   Osmolality, Ur 440 300 - 900 mOsm/kg    Comment: Performed at [facility] [address] [place], [place] [address]  Urinalysis, Routine w reflex microscopic Urine, Catheterized     Status: Abnormal   Collection Time: 02/26/20  5:22 PM  Result Value Ref Range   Color, Urine AMBER (A) YELLOW    Comment: BIOCHEMICALS MAY BE AFFECTED BY COLOR   APPearance HAZY (A) CLEAR   Specific Gravity, Urine 1.034 (H) 1.005 - 1.030   pH 5.0 5.0 - 8.0   Glucose, UA 50 (A) NEGATIVE mg/dL   Hgb urine dipstick SMALL (A) NEGATIVE   Bilirubin Urine SMALL (A) NEGATIVE   Ketones, ur 5 (A) NEGATIVE mg/dL   Protein, ur >=300 (A) NEGATIVE mg/dL   Nitrite NEGATIVE NEGATIVE   Leukocytes,Ua TRACE (A) NEGATIVE   RBC / HPF 0-5 0 - 5 RBC/hpf   WBC, UA 6-10 0 - 5 WBC/hpf   Bacteria, UA MANY (A) NONE SEEN   Squamous Epithelial / LPF 6-10 0 - 5   Mucus PRESENT    Hyaline Casts, UA PRESENT     Comment: Performed at [facility], [place] [address] [place], [place] [address]  Glucose, capillary     Status: Abnormal   Collection Time: 02/26/20  9:06 PM  Result Value Ref Range   Glucose-Capillary 229 (H) 70 - 99 mg/dL    Comment: Glucose reference range applies  only to samples taken after fasting for at least 8 hours.  Brain natriuretic peptide     Status: Abnormal   Collection Time: 02/27/20  5:00 AM  Result Value Ref Range   B Natriuretic Peptide 699.1 (H) 0.0 - 100.0 pg/mL    Comment: Performed at [facility] [address], [name],  [address]  CBC with Differential/Platelet     Status: Abnormal   Collection Time: 02/27/20  5:00 AM  Result Value Ref Range   WBC 7.7 4.0 - 10.5 K/uL   RBC 4.26 3.87 - 5.11 MIL/uL   Hemoglobin 10.6 (L) 12.0 - 15.0 g/dL   HCT 35.0 (L) 36 - 46 %   MCV 82.2 80.0 - 100.0 fL   MCH 24.9 (L) 26.0 - 34.0 pg   MCHC 30.3 30.0 - 36.0 g/dL   RDW 18.7 (H) 11.5 - 15.5 %   Platelets 371 150 - 400 K/uL   nRBC 1.4 (H) 0.0 - 0.2 %   Neutrophils Relative % 68 %   Neutro Abs 5.2 1.7 - 7.7 K/uL   Lymphocytes Relative 21 %   Lymphs Abs 1.6 0.7 - 4.0 K/uL   Monocytes Relative 10 %   Monocytes Absolute 0.8 0.1 - 1.0 K/uL   Eosinophils Relative 1 %   Eosinophils Absolute 0.1 0.0 - 0.5 K/uL   Basophils Relative 0 %   Basophils Absolute 0.0 0.0 - 0.1  K/uL   Immature Granulocytes 0 %   Abs Immature Granulocytes 0.03 0.00 - 0.07 K/uL    Comment: Performed at [facility], [place] [address] [place], [place] [address]  Comprehensive metabolic panel     Status: Abnormal   Collection Time: 02/27/20  5:00 AM  Result Value Ref Range   Sodium 133 (L) 135 - 145 mmol/L   Potassium 4.1 3.5 - 5.1 mmol/L   Chloride 101 98 - 111 mmol/L   CO2 22 22 - 32 mmol/L   Glucose, Bld 164 (H) 70 - 99 mg/dL    Comment: Glucose reference range applies only to samples taken after fasting for at least 8 hours.   BUN 39 (H) 6 - 20 mg/dL   Creatinine, Ser 2.88 (H) 0.44 - 1.00 mg/dL   Calcium 7.7 (L) 8.9 - 10.3 mg/dL   Total Protein 5.9 (L) 6.5 - 8.1 g/dL   Albumin 1.6 (L) 3.5 - 5.0 g/dL   AST 920 (H) 15 - 41 U/L   ALT 181 (H) 0 - 44 U/L   Alkaline Phosphatase 184 (H) 38 - 126 U/L   Total Bilirubin 0.4 0.3 - 1.2 mg/dL   GFR,  Estimated 18 (L) >60 mL/min    Comment: (NOTE) Calculated using the CKD-EPI Creatinine Equation (2021)    Anion gap 10 5 - 15    Comment: Performed at [facility], [place] [address], [place], [place] [address]  Magnesium     Status: None   Collection Time: 02/27/20  5:00 AM  Result Value Ref Range   Magnesium 2.0 1.7 - 2.4 mg/dL    Comment: Performed at [facility] [address] [place], [place] [address]  Glucose, capillary     Status: Abnormal   Collection Time: 02/27/20  8:35 AM  Result Value Ref Range   Glucose-Capillary 127 (H) 70 - 99 mg/dL    Comment: Glucose reference range applies only to samples taken after fasting for at least 8 hours.  Glucose, capillary     Status: Abnormal   Collection Time: 02/27/20 12:22 PM  Result Value Ref Range   Glucose-Capillary 174 (H) 70 - 99 mg/dL    Comment: Glucose reference range applies only to samples taken after fasting for at least 8 hours.  Glucose, capillary     Status: Abnormal   Collection Time: 02/27/20  4:00 PM  Result Value Ref Range   Glucose-Capillary 129 (H) 70 - 99 mg/dL    Comment: Glucose reference range applies only to samples taken after fasting for at least 8 hours.  Glucose, capillary     Status: Abnormal   Collection Time: 02/27/20  9:28 PM  Result Value Ref Range   Glucose-Capillary 57 (L) 70 - 99 mg/dL    Comment: Glucose reference range applies only to samples taken after fasting for at least 8 hours.  Glucose, capillary     Status: Abnormal   Collection Time: 02/27/20 10:04 PM  Result Value Ref Range   Glucose-Capillary 129 (H) 70 - 99 mg/dL    Comment: Glucose reference range applies only to samples taken after fasting for at least 8 hours.  Glucose, capillary     Status: None   Collection Time: 02/28/20 12:37 AM  Result Value Ref Range   Glucose-Capillary 77 70 - 99 mg/dL    Comment: Glucose reference range applies only to samples taken after fasting for at least 8 hours.  Glucose, capillary  Status: Abnormal   Collection Time: 02/28/20  2:09 AM  Result Value Ref Range   Glucose-Capillary 116 (H) 70 - 99 mg/dL    Comment: Glucose reference range applies only to samples taken after fasting for at least 8 hours.  Glucose, capillary     Status: None   Collection Time: 02/28/20  4:31 AM  Result Value Ref Range   Glucose-Capillary 74 70 - 99 mg/dL    Comment: Glucose reference range applies only to samples taken after fasting for at least 8 hours.  Brain natriuretic peptide     Status: Abnormal   Collection Time: 02/28/20  5:00 AM  Result Value Ref Range   B Natriuretic Peptide 510.0 (H) 0.0 - 100.0 pg/mL    Comment: Performed at [facility] [address], [place], [place] [address]  CBC with Differential/Platelet     Status: Abnormal   Collection Time: 02/28/20  5:00 AM  Result Value Ref Range   WBC 7.3 4.0 - 10.5 K/uL   RBC 3.40 (L) 3.87 - 5.11 MIL/uL   Hemoglobin 8.5 (L) 12.0 - 15.0 g/dL   HCT 28.2 (L) 36 - 46 %   MCV 82.9 80.0 - 100.0 fL   MCH 25.0 (L) 26.0 - 34.0 pg   MCHC 30.1 30.0 - 36.0 g/dL   RDW 18.8 (H) 11.5 - 15.5 %   Platelets 319 150 - 400 K/uL   nRBC 1.6 (H) 0.0 - 0.2 %   Neutrophils Relative % 67 %   Neutro Abs 4.9 1.7 - 7.7 K/uL   Lymphocytes Relative 17 %   Lymphs Abs 1.2 0.7 - 4.0 K/uL   Monocytes Relative 13 %   Monocytes Absolute 0.9 0.1 - 1.0 K/uL   Eosinophils Relative 2 %   Eosinophils Absolute 0.1 0.0 - 0.5 K/uL   Basophils Relative 0 %   Basophils Absolute 0.0 0.0 - 0.1 K/uL   Immature Granulocytes 1 %   Abs Immature Granulocytes 0.05 0.00 - 0.07 K/uL    Comment: Performed at [facility], [place] [address] [place],  [address]  Comprehensive metabolic panel     Status: Abnormal   Collection Time: 02/28/20  5:00 AM  Result Value Ref Range   Sodium 134 (L) 135 - 145 mmol/L   Potassium 2.9 (L) 3.5 - 5.1 mmol/L   Chloride 122 (H) 98 - 111 mmol/L   CO2 18 (L) 22 - 32 mmol/L   Glucose, Bld 121 (H) 70 - 99 mg/dL     Comment: Glucose reference range applies only to samples taken after fasting for at least 8 hours.   BUN 33 (H) 6 - 20 mg/dL   Creatinine, Ser 2.38 (H) 0.44 - 1.00 mg/dL   Calcium 5.8 (LL) 8.9 - 10.3 mg/dL    Comment: CRITICAL RESULT CALLED TO, READ BACK BY AND VERIFIED WITH: C.MJOROVE RN @ [phone] 02/28/2020 BY C.EDENS QUESTIONABLE RESULTS, RECOMMEND RECOLLECT TO VERIFY    Total Protein 5.4 (L) 6.5 - 8.1 g/dL   Albumin 1.1 (L) 3.5 - 5.0 g/dL   AST 829 (H) 15 - 41 U/L   ALT 235 (H) 0 - 44 U/L   Alkaline Phosphatase 181 (H) 38 - 126 U/L   Total Bilirubin 0.6 0.3 - 1.2 mg/dL   GFR, Estimated 23 (L) >60 mL/min    Comment: (NOTE) Calculated using the CKD-EPI Creatinine Equation (2021)    Anion gap 0.0 (L) 5 - 15    Comment: QUESTIONABLE RESULTS, RECOMMEND RECOLLECT  TO VERIFY Performed at [facility] [address] [place], [place] [address]   Magnesium     Status: Abnormal   Collection Time: 02/28/20  5:00 AM  Result Value Ref Range   Magnesium 1.4 (L) 1.7 - 2.4 mg/dL    Comment: Performed at [facility] [address], [place], [place] [address]  Acetaminophen level     Status: Abnormal   Collection Time: 02/28/20  5:00 AM  Result Value Ref Range   Acetaminophen (Tylenol), Serum <10 (L) 10 - 30 ug/mL    Comment: (NOTE) Therapeutic concentrations vary significantly. A range of 10-30 ug/mL  may be an effective concentration for many patients. However, some  are best treated at concentrations outside of this range. Acetaminophen concentrations >150 ug/mL at 4 hours after ingestion  and >50 ug/mL at 12 hours after ingestion are often associated with  toxic reactions.  Performed at [facility], [place] [address], [place], [place] [address]   Protime-INR     Status: Abnormal   Collection Time: 02/28/20  5:00 AM  Result Value Ref Range   Prothrombin Time 31.4 (H) 11.4 - 15.2 seconds   INR 3.2 (H) 0.8 - 1.2    Comment: (NOTE) INR goal varies based on device and  disease states. Performed at [facility], [place] [address] [place], [place] [address]   Glucose, capillary     Status: Abnormal   Collection Time: 02/28/20  7:28 AM  Result Value Ref Range   Glucose-Capillary 101 (H) 70 - 99 mg/dL    Comment: Glucose reference range applies only to samples taken after fasting for at least 8 hours.   DG Chest Port 1 View  Result Date: 02/27/2020 CLINICAL DATA:  Short of breath EXAM: PORTABLE CHEST 1 VIEW COMPARISON:  02/26/2020 FINDINGS: Cardiac enlargement and vascular congestion. Mild improvement in bilateral airspace disease consistent with edema. No significant effusion. Left arm PICC tip in the SVC IMPRESSION: Congestive heart failure with mild interval improvement in edema. Electronically Signed   By: Franchot Gallo M.D.   On: 02/27/2020 08:02   US Abdomen Limited RUQ (LIVER/GB)  Result Date: 02/28/2020 CLINICAL DATA:  Transaminitis, EXAM: ULTRASOUND ABDOMEN LIMITED RIGHT UPPER QUADRANT COMPARISON:  None. FINDINGS: Gallbladder: The gallbladder is mildly distended and there is pericholecystic fluid identified within the a gallbladder fossa. A small amount of layering sludge is seen within the gallbladder neck. There is no associated gallbladder wall thickening. The sonographic Percell Miller sign is reportedly positive. Common bile duct: Diameter: 3 mm Liver: Liver parenchymal echogenicity and echotexture is normal. No focal intrahepatic masses are seen and there is no intrahepatic biliary ductal dilation. Portal vein is patent on color Doppler imaging with normal direction of blood flow towards the liver. Other: No ascites IMPRESSION: Mild distention of the gallbladder, gallbladder sludge, and pericholecystic fluid identified. Reportedly positive sonographic Murphy sign. Together, the findings are suspicious for changes of acute, calculus cholecystitis. Electronically Signed   By: Fidela Salisbury MD   On: 02/28/2020 01:23   Assessment/Plan Chronic systolic and  diastolic CHF - echo 10/6281 EF 40-45% CKD II COPD not on home O2  OSA - BiPAP or CPAP at home  DM2 - A1c this admission 15.1, poorly controlled, peripheral neuropathy HLD HTN PAD  L femoropoliteal bypass and laser angioplasty L[facility] by Dr. Vella [facility] 10/14/19 Chronic non-healing L foot ulcer Serratia bacteremia   Elevated LFT's  Possible calculous cholecystitis  - 59 y/o F with no known  history of gallbladder disease and multiple medical comorbidities as above who developed an acute rise in her LFTs and has vague abdominal pain intermittently associated with poor appetite. RUQ U/S with signs of possible cholecystitis. Her exam is benign and is not consistent with acute cholecystitis. Will get HIDA scan to rule out cholecystitis. Given above medical conditions and baseline physical deconditioning, if HIDA is positive I would recommend percutaneous cholecystostomy tube placement, but I will confirm this plan of care with MD. Jill Alexanders, PA-C [facility] Please see Amion for pager number during day hours 7:00am-4:30pm 02/28/2020, 9:34 AM

## 2020-02-28 NOTE — Progress Notes (Signed)
Patient is NPO d/t abnormal ultrasound results. Patient BS dropped twice, also noted with critical labs. On call MD was notified. New orders in [facility] for meds and IVF.

## 2020-02-28 NOTE — Progress Notes (Signed)
Pt placed on CPAP and she is resting well at this time. RN aware.

## 2020-02-28 NOTE — Plan of Care (Signed)
  Problem: Education: Goal: Knowledge of General Education information will improve Description: Including pain rating scale, medication(s)/side effects and non-pharmacologic comfort measures Outcome: Progressing   Problem: Health Behavior/Discharge Planning: Goal: Ability to manage health-related needs will improve Outcome: Progressing   Problem: Clinical Measurements: Goal: Ability to maintain clinical measurements within normal limits will improve Outcome: Progressing Goal: Diagnostic test results will improve Outcome: Progressing Goal: Respiratory complications will improve Outcome: Progressing Goal: Cardiovascular complication will be avoided Outcome: Progressing   Problem: Activity: Goal: Risk for activity intolerance will decrease Outcome: Progressing   Problem: Nutrition: Goal: Adequate nutrition will be maintained Outcome: Progressing   Problem: Coping: Goal: Level of anxiety will decrease Outcome: Progressing   Problem: Elimination: Goal: Will not experience complications related to bowel motility Outcome: Progressing Goal: Will not experience complications related to urinary retention Outcome: Progressing   Problem: Pain Managment: Goal: General experience of comfort will improve Outcome: Progressing   Problem: Skin Integrity: Goal: Risk for impaired skin integrity will decrease Outcome: Progressing   Problem: Education: Goal: Ability to describe self-care measures that may prevent or decrease complications (Diabetes Survival Skills Education) will improve Outcome: Progressing Goal: Individualized Educational Video(s) Outcome: Progressing   Problem: Coping: Goal: Ability to adjust to condition or change in health will improve Outcome: Progressing   Problem: Fluid Volume: Goal: Ability to maintain a balanced intake and output will improve Outcome: Progressing   Problem: Metabolic: Goal: Ability to maintain appropriate glucose levels will  improve Outcome: Progressing   Problem: Nutritional: Goal: Maintenance of adequate nutrition will improve Outcome: Progressing Goal: Progress toward achieving an optimal weight will improve Outcome: Progressing   Problem: Skin Integrity: Goal: Risk for impaired skin integrity will decrease Outcome: Progressing   Problem: Tissue Perfusion: Goal: Adequacy of tissue perfusion will improve Outcome: Progressing

## 2020-02-28 NOTE — Progress Notes (Signed)
Patient BS dropped twice this shift. Pt is NPO for pending procedures. Reached out on on call MD. New order for dextrose 50 ML x1.

## 2020-02-28 NOTE — Progress Notes (Signed)
PROGRESS NOTE                                                                                                                                                                                                             Patient Demographics:    Morgan Beasley, is a 59 y.o. female, DOB - 04-02-1961, VOJ:[id number]  Outpatient Primary MD for the patient is Beasley, Morgan Alexander, NP    LOS - 5  Admit date - 02/23/2020    Chief Complaint  Patient presents with  . Wound Infection       Brief Narrative (HPI from H&P)  - 59 year old female, lives alone but her daughter checks on her regularly, ambulates with the help of a walker with seat, PMH of chronic combined systolic and diastolic CHF, ongoing tobacco abuse, stage II CKD, COPD not on home oxygen, OSA on nightly BiPAP, poorly controlled DM2 with peripheral neuropathy, gout, HLD, HTN, prior substance abuse (THC, cocaine), PAD s/p L SFA stenting 08/2019 and left femoral to below-knee popliteal bypass 09/2019 with nonhealing left foot ulcer, mild CAD presented initially to the [facility] ED on 10/25 due to leg and foot pain.  Admitted for sepsis/Serratia bacteremia, left lower extremity cellulitis complicating chronic nonhealing ulcer, persistent lactic acidosis and concern for critical left lower extremity ischemia.  Vascular surgery was consulted and she was transferred from [facility] to [facility] under my care on 02/25/2020.   Subjective:   Patient in bed, appears comfortable, denies any headache, no fever, no chest pain or pressure, no shortness of breath , no abdominal pain. No focal weakness.   Assessment  & Plan :    1.  Septic shock caused by Serratia bacteremia due to bilateral lower extremity PAD with chronic left lower extremity nonhealing ulcer in a diabetic patient.  She has been treated with IV fluids along with IV cefepime and oral Flagyl.  Vascular  surgery following, sepsis pathophysiology seems to have improved, continue supportive care with antibiotics and wound care, continue hydration with IV fluids and minimize narcotic use as much as possible.  Defer further management of PAD issues to vascular surgery.  2.  PAD s/p left femoropopliteal bypass in 10/19/2019, CAD.  On Plavix and statin for secondary prevention further assess #1 above.  3.   Incidental finding of reactive mediastinal hilar  and right supraclavicular adenopathy.  Request PCP to arrange for outpatient biopsy.  4.  Dyslipidemia.  On statin and Zetia.  5.  Chronic narcotic use.  Mildly somnolent, Narcan as needed, reduce narcotics and use with caution, patient and daughter updated on 02/25/2020.  6.  COPD and smoking.  Counseled to quit smoking, supportive care, no wheezing.  7.  Obesity with OSA.  BMI of 32, follow with PCP, use BiPAP whenever she sleeps day or night.  High risk for CO2 retention.  8.  Essential hypertension.  Hold beta-blocker due to hypotension, IV fluids.  9.  Tonic combined systolic and diastolic CHF.  Echo done few months ago showing a EF of 40% with global hypokinesis.  Continue Plavix, statin and beta-blocker for secondary prevention.  10.  AKI with CKD stage IIIa.  Baseline creatinine around 1.4, AKI worse due to a combination of IV contrast for CTA, dehydration and hypotension, hold beta-blocker, continue hydration with IV fluids on 02/27/2020, nonacute renal ultrasound, urine sodium less than 10.  Monitor.  11.  Anemia of chronic disease.  Mild drop due to heme dilution.  Monitor.  12. Poor IV access - PICC needed for IV  meds and ABX, CKD 3 A - benefits outweigh the risks.  13.  Elevated transaminases 02/27/2020.  Could be shock liver due to hypotension, she is completely pain and symptom-free, right upper quadrant ultrasound showed some nonspecific changes, seen by general surgery, also INR is high for which she will get vitamin K on  02/28/2020, acute hepatitis panel negative, will monitor the trend closely.   14. Hypokalemia , Hypomagnesemia - replaced  15. DM 2, poor outpatient control due to hyperglycemia.  With A1c of 15, on Lantus and sliding scale monitor and adjust.  Lab Results  Component Value Date   HGBA1C 15.1 (H) 02/23/2020   CBG (last 3)  Recent Labs    02/28/20 0209 02/28/20 0431 02/28/20 0728  GLUCAP 116* 74 101*       Condition - Extremely Guarded  Family Communication  [name] - [phone] - on 02/25/20  Code Status :  Full  Consults  :  VVS - Dr. Oneida Alar, general surgery  Procedures  :    PICC line placement 02/25/2020.    Leg [place] - No DVT  RUQ [place] -  Renal US - 1. No hydronephrosis. 2. Mildly echogenic normal size kidneys, compatible with reported history of nonspecific acute renal parenchymal disease. 3. Normal bladder. 4. Incidentally noted nonspecific diffuse gallbladder wall thickening. If there is clinical concern for acute cholecystitis, dedicated right upper quadrant abdominal sonogram could be obtained for further evaluation  CTA -  IMPRESSION: 1. Patent left lower extremity femoral to below knee popliteal artery bypass graft. Evaluation of the left tibial vasculature is limited by calcifications, however there appears to be a 2 vessel runoff to the left ankle via the anterior tibial and posterior tibial arteries. 2. Progressive peripheral vascular disease involving the right lower extremity. There is now complete occlusion of the mid to distal right superficial femoral artery, new since prior study. There are now appears to be a single vessel runoff to the right foot via the anterior tibial artery. In June, there was a 3 vessel runoff to the right foot. 3. Nonspecific, asymmetric bilateral lower extremity edema (left worse than right). This is of unknown clinical significance. This study cannot adequately exclude a DVT. If there is clinical suspicion for a DVT, follow-up with  ultrasound is recommended. 4.  No evidence for an acute pulmonary embolism. No evidence for thoracic aortic dissection or aneurysm. 5. Cardiomegaly with small to moderate-sized bilateral pleural effusions, right worse than left. 6. Persistent mediastinal and hilar adenopathy with worsening right supraclavicular adenopathy. Findings may be reactive or due to a lymphoproliferative disorder or metastatic disease. Follow-up is recommended. Of note, the right supraclavicular lymph node is amenable to percutaneous ultrasound-guided biopsy if clinically indicated. 7. Hepatomegaly with likely underlying hepatic steatosis. 8.  Aortic Atherosclerosis   PUD Prophylaxis : PPI  Disposition Plan  :    Status is: Inpatient  Remains inpatient appropriate because:Inpatient level of care appropriate due to severity of illness   Dispo: The patient is from: Home              Anticipated d/c is to: Home              Anticipated d/c date is: > 3 days              Patient currently is medically stable to d/c.   DVT Prophylaxis  :  Heparin    Lab Results  Component Value Date   PLT 319 02/28/2020    Diet :  Diet Order            Diet NPO time specified Except for: Sips with Meds  Diet effective now                  Inpatient Medications  Scheduled Meds: . amitriptyline  200 mg Oral QHS  . Chlorhexidine Gluconate Cloth  6 each Topical Daily  . clopidogrel  75 mg Oral Daily  . collagenase   Topical Daily  . ezetimibe  10 mg Oral QPC supper  . fluticasone furoate-vilanterol  1 puff Inhalation Daily  . gabapentin  800 mg Oral TID  . [START ON 03/01/2020] heparin injection (subcutaneous)  5,000 Units Subcutaneous Q8H  . insulin aspart  0-15 Units Subcutaneous TID WC  . insulin aspart  0-5 Units Subcutaneous QHS  . insulin glargine  30 Units Subcutaneous QHS  . lipase/protease/amylase  72,000 Units Oral TID WC  . metroNIDAZOLE  500 mg Oral Q8H  . naLOXone (NARCAN)  injection  0.1 mg Intravenous  Once  . pantoprazole  40 mg Oral Daily   Continuous Infusions: . ceFEPime (MAXIPIME) IV 2 g (02/28/20 0529)  . lactated ringers with KCl/Additives Pediatric custom IV fluid 75 mL/hr at 02/28/20 0848  . lactated ringers Stopped (02/26/20 1041)   PRN Meds:.acetaminophen **OR** [DISCONTINUED] acetaminophen, dextrose, HYDROcodone-acetaminophen, lipase/protease/amylase, [DISCONTINUED] ondansetron **OR** ondansetron (ZOFRAN) IV, polyethylene glycol  Antibiotics  :    Anti-infectives (From admission, onward)   Start     Dose/Rate Route Frequency Ordered Stop   02/28/20 0546  ceFEPIme (MAXIPIME) 2 g in sodium chloride 0.9 % 100 mL IVPB        2 g 200 mL/hr over 30 Minutes Intravenous Every 24 hours 02/27/20 1041     02/25/20 1400  metroNIDAZOLE (FLAGYL) tablet 500 mg        500 mg Oral Every 8 hours 02/25/20 1025     02/24/20 1800  ceFEPIme (MAXIPIME) 2 g in sodium chloride 0.9 % 100 mL IVPB  Status:  Discontinued        2 g 200 mL/hr over 30 Minutes Intravenous Every 12 hours 02/24/20 0732 02/27/20 1041   02/24/20 0600  ceFEPIme (MAXIPIME) 2 g in sodium chloride 0.9 % 100 mL IVPB  Status:  Discontinued  2 g 200 mL/hr over 30 Minutes Intravenous Every 8 hours 02/24/20 0440 02/24/20 0732   02/23/20 1015  vancomycin (VANCOREADY) IVPB 1750 mg/350 mL        1,750 mg 175 mL/hr over 120 Minutes Intravenous  Once 02/23/20 1007 02/23/20 1347   02/23/20 1000  cefTRIAXone (ROCEPHIN) 2 g in sodium chloride 0.9 % 100 mL IVPB  Status:  Discontinued        2 g 200 mL/hr over 30 Minutes Intravenous Every 24 hours 02/23/20 0952 02/24/20 0440   02/23/20 1000  metroNIDAZOLE (FLAGYL) IVPB 500 mg  Status:  Discontinued        500 mg 100 mL/hr over 60 Minutes Intravenous Every 8 hours 02/23/20 0952 02/25/20 1025       Time Spent in minutes  30   Lala Lund M.D on 02/28/2020 at 11:23 AM  To page go to [email or web address] - password [facility]  [facility] -  Office  [phone]    See all  Orders from today for further details    Objective:   Vitals:   02/28/20 0015 02/28/20 0331 02/28/20 0755 02/28/20 0835  BP: 108/86 (!) 85/75 (!) 112/91   Pulse: 79 83    Resp: 18 20 16    Temp: 97.6 F (36.4 C) 98.3 F (36.8 C) 98.2 F (36.8 C)   TempSrc: Axillary Oral Oral   SpO2: 97% 100%  100%  Weight:      Height:        Wt Readings from Last 3 Encounters:  02/23/20 90.3 kg  01/26/20 90.7 kg  01/16/20 93.9 kg     Intake/Output Summary (Last 24 hours) at 02/28/2020 1123 Last data filed at 02/28/2020 0981 Gross per 24 hour  Intake 2045.58 ml  Output 400 ml  Net 1645.58 ml     Physical Exam  Awake Alert, No new F.N deficits, Normal affect [place].AT,PERRAL Supple Neck,No JVD, No cervical lymphadenopathy appriciated.  Symmetrical Chest wall movement, Good air movement bilaterally, CTAB RRR,No Gallops, Rubs or new Murmurs, No Parasternal Heave +ve B.Sounds, Abd Soft, No tenderness, No organomegaly appriciated, No rebound - guarding or rigidity.  lower extremity wounds as below.          Data Review:    CBC Recent Labs  Lab 02/23/20 0952 02/23/20 0952 02/24/20 0355 02/25/20 0254 02/26/20 0343 02/27/20 0500 02/28/20 0500  WBC 11.4*   < > 11.3* 7.3 8.0 7.7 7.3  HGB 12.4   < > 9.9* 9.7* 10.5* 10.6* 8.5*  HCT 40.4   < > 32.7* 32.0* 34.7* 35.0* 28.2*  PLT 419*   < > 313 322 357 371 319  MCV 82.1   < > 82.4 82.7 83.2 82.2 82.9  MCH 25.2*   < > 24.9* 25.1* 25.2* 24.9* 25.0*  MCHC 30.7   < > 30.3 30.3 30.3 30.3 30.1  RDW 18.8*   < > 18.4* 18.7* 18.6* 18.7* 18.8*  LYMPHSABS 1.4  --   --   --  1.4 1.6 1.2  MONOABS 0.5  --   --   --  0.7 0.8 0.9  EOSABS 0.1  --   --   --  0.1 0.1 0.1  BASOSABS 0.0  --   --   --  0.0 0.0 0.0   < > = values in this interval not displayed.    Recent Labs  Lab 02/23/20 [phone] 02/23/20 7829 02/23/20 1149 02/23/20 1152 02/23/20 1432 02/23/20 1453 02/23/20 1934 02/24/20 0355 02/24/20 1901 02/25/20 0254 02/26/20 0148  02/26/20 0343 02/26/20 0344 02/27/20 0500 02/28/20 0500  NA 133*   < >  --   --   --   --   --  136  --  134*  --  134*  --  133* 134*  K 3.7   < >  --   --   --   --   --  3.2*  --  4.1  --  5.4*  --  4.1 2.9*  CL 97*   < >  --   --   --   --   --  104  --  104  --  103  --  101 122*  CO2 26   < >  --   --   --   --   --  24  --  21*  --  22  --  22 18*  GLUCOSE 439*   < >  --   --   --   --   --  117*  --  136*  --  120*  --  164* 121*  BUN 16   < >  --   --   --   --   --  14  --  22*  --  31*  --  39* 33*  CREATININE 1.17*   < >  --   --   --   --   --  1.38*  --  1.80*  --  2.52*  --  2.88* 2.38*  CALCIUM 8.3*   < >  --   --   --   --   --  7.4*  --  7.6*  --  7.8*  --  7.7* 5.8*  AST 18  --   --   --   --   --   --   --   --   --   --  66*  --  920* 829*  ALT 18  --   --   --   --   --   --   --   --   --   --  24  --  181* 235*  ALKPHOS 158*  --   --   --   --   --   --   --   --   --   --  137*  --  184* 181*  BILITOT 0.6  --   --   --   --   --   --   --   --   --   --  0.7  --  0.4 0.6  ALBUMIN 2.6*  --   --   --   --   --   --   --   --   --   --  1.6*  --  1.6* 1.1*  MG  --   --   --   --   --   --   --   --   --   --  1.5* 1.5*  --  2.0 1.4*  PROCALCITON  --   --  <0.10  --   --   --   --  0.67  --  0.77  --   --   --   --   --   LATICACIDVEN 2.6*  --   --    < > 3.6*  --  >11.0*  --  2.1* 0.9  --   --  1.5  --   --   INR 1.2  --   --   --   --   --   --  1.4*  --   --   --  1.7*  --   --  3.2*  HGBA1C  --   --   --   --   --  15.1*  --   --   --   --   --   --   --   --   --   BNP 868.5*  --   --   --   --   --   --   --   --   --   --  658.8*  --  699.1* 510.0*   < > = values in this interval not displayed.    ------------------------------------------------------------------------------------------------------------------ No results for input(s): CHOL, HDL, LDLCALC, TRIG, CHOLHDL, LDLDIRECT in the last 72 hours.  Lab Results  Component Value Date   HGBA1C 15.1 (H)  02/23/2020   ------------------------------------------------------------------------------------------------------------------ No results for input(s): TSH, T4TOTAL, T3FREE, THYROIDAB in the last 72 hours.  Invalid input(s): FREET3  Cardiac Enzymes No results for input(s): CKMB, TROPONINI, MYOGLOBIN in the last 168 hours.  Invalid input(s): CK ------------------------------------------------------------------------------------------------------------------    Component Value Date/Time   BNP 510.0 (H) 02/28/2020 0500    Micro Results Recent Results (from the past 240 hour(s))  Blood Culture (routine x 2)     Status: Abnormal   Collection Time: 02/23/20  9:52 AM   Specimen: BLOOD  Result Value Ref Range Status   Specimen Description   Final    BLOOD RIGHT ANTECUBITAL Performed at [facility] [address], [place], [place] [address]    Special Requests   Final    BOTTLES DRAWN AEROBIC AND ANAEROBIC Blood Culture adequate volume Performed at [facility] [address] [place], [place] [address]    Culture  Setup Time   Final    GRAM NEGATIVE RODS IN BOTH AEROBIC AND ANAEROBIC BOTTLES CRITICAL RESULT CALLED TO, READ BACK BY AND VERIFIED WITH: Irwin Brakeman 1961 02/24/2020 Mena Goes Performed at [facility], [place] [address] [place], [place] [address]    Culture SERRATIA MARCESCENS (A)  Final   Report Status 02/26/2020 FINAL  Final   Organism ID, Bacteria SERRATIA MARCESCENS  Final      Susceptibility   Serratia marcescens - MIC*    CEFAZOLIN >=64 RESISTANT Resistant     CEFEPIME <=0.12 SENSITIVE Sensitive     CEFTAZIDIME <=1 SENSITIVE Sensitive     CEFTRIAXONE <=0.25 SENSITIVE Sensitive     CIPROFLOXACIN <=0.25 SENSITIVE Sensitive     GENTAMICIN <=1 SENSITIVE Sensitive     TRIMETH/SULFA <=20 SENSITIVE Sensitive     * SERRATIA MARCESCENS  Blood Culture ID Panel (Reflexed)     Status: Abnormal   Collection Time:  02/23/20  9:52 AM  Result Value Ref Range Status   Enterococcus faecalis NOT DETECTED NOT DETECTED Final   Enterococcus Faecium NOT DETECTED NOT DETECTED Final   Listeria monocytogenes NOT DETECTED NOT DETECTED Final   Staphylococcus species NOT DETECTED NOT DETECTED Final   Staphylococcus aureus (BCID) NOT DETECTED NOT DETECTED Final   Staphylococcus epidermidis NOT DETECTED NOT DETECTED Final   Staphylococcus lugdunensis NOT DETECTED NOT DETECTED Final   Streptococcus species NOT DETECTED NOT DETECTED Final   Streptococcus agalactiae NOT DETECTED NOT DETECTED Final   Streptococcus pneumoniae NOT DETECTED NOT DETECTED Final   Streptococcus  pyogenes NOT DETECTED NOT DETECTED Final   A.calcoaceticus-baumannii NOT DETECTED NOT DETECTED Final   Bacteroides fragilis NOT DETECTED NOT DETECTED Final   Enterobacterales DETECTED (A) NOT DETECTED Final    Comment: Enterobacterales represent a large order of gram negative bacteria, not a single organism. CRITICAL RESULT CALLED TO, READ BACK BY AND VERIFIED WITH: M. LILLISTON,PHARMD 0257 02/24/2020 T. TYSOR    Enterobacter cloacae complex NOT DETECTED NOT DETECTED Final   Escherichia coli NOT DETECTED NOT DETECTED Final   Klebsiella aerogenes NOT DETECTED NOT DETECTED Final   Klebsiella oxytoca NOT DETECTED NOT DETECTED Final   Klebsiella pneumoniae NOT DETECTED NOT DETECTED Final   Proteus species NOT DETECTED NOT DETECTED Final   Salmonella species NOT DETECTED NOT DETECTED Final   Serratia marcescens DETECTED (A) NOT DETECTED Final    Comment: CRITICAL RESULT CALLED TO, READ BACK BY AND VERIFIED WITH: M. LILLISTON,PHARMD 0257 02/24/2020 T. TYSOR    Haemophilus influenzae NOT DETECTED NOT DETECTED Final   Neisseria meningitidis NOT DETECTED NOT DETECTED Final   Pseudomonas aeruginosa NOT DETECTED NOT DETECTED Final   Stenotrophomonas maltophilia NOT DETECTED NOT DETECTED Final   Candida albicans NOT DETECTED NOT DETECTED Final   Candida  auris NOT DETECTED NOT DETECTED Final   Candida glabrata NOT DETECTED NOT DETECTED Final   Candida krusei NOT DETECTED NOT DETECTED Final   Candida parapsilosis NOT DETECTED NOT DETECTED Final   Candida tropicalis NOT DETECTED NOT DETECTED Final   Cryptococcus neoformans/gattii NOT DETECTED NOT DETECTED Final   CTX-M ESBL NOT DETECTED NOT DETECTED Final   Carbapenem resistance IMP NOT DETECTED NOT DETECTED Final   Carbapenem resistance KPC NOT DETECTED NOT DETECTED Final   Carbapenem resistance NDM NOT DETECTED NOT DETECTED Final   Carbapenem resist OXA 48 LIKE NOT DETECTED NOT DETECTED Final   Carbapenem resistance VIM NOT DETECTED NOT DETECTED Final    Comment: Performed at [facility], [place]. [address] [place],  [address]  Respiratory Panel by RT PCR (Flu A&B, Covid) - Nasopharyngeal Swab     Status: None   Collection Time: 02/23/20  9:55 AM   Specimen: Nasopharyngeal Swab  Result Value Ref Range Status   SARS Coronavirus 2 by RT PCR NEGATIVE NEGATIVE Final    Comment: (NOTE) SARS-CoV-2 target nucleic acids are NOT DETECTED.  The SARS-CoV-2 RNA is generally detectable in upper respiratoy specimens during the acute phase of infection. The lowest concentration of SARS-CoV-2 viral copies this assay can detect is 131 copies/mL. A negative result does not preclude SARS-Cov-2 infection and should not be used as the sole basis for treatment or other patient management decisions. A negative result may occur with  improper specimen collection/handling, submission of specimen other than nasopharyngeal swab, presence of viral mutation(s) within the areas targeted by this assay, and inadequate number of viral copies (<131 copies/mL). A negative result must be combined with clinical observations, patient history, and epidemiological information. The expected result is Negative.  Fact Sheet for Patients:  PinkCheek.be  Fact Sheet for Healthcare  Providers:  GravelBags.it  This test is no t yet approved or cleared by the [place] FDA and  has been authorized for detection and/or diagnosis of SARS-CoV-2 by FDA under an Emergency Use Authorization (EUA). This EUA will remain  in effect (meaning this test can be used) for the duration of the COVID-19 declaration under Section 564(b)(1) of the Act, 21 U.S.C. section 360bbb-3(b)(1), unless the authorization is terminated or revoked sooner.     Influenza  A by PCR NEGATIVE NEGATIVE Final   Influenza B by PCR NEGATIVE NEGATIVE Final    Comment: (NOTE) The Xpert Xpress SARS-CoV-2/FLU/RSV assay is intended as an aid in  the diagnosis of influenza from Nasopharyngeal swab specimens and  should not be used as a sole basis for treatment. Nasal washings and  aspirates are unacceptable for Xpert Xpress SARS-CoV-2/FLU/RSV  testing.  Fact Sheet for Patients: PinkCheek.be  Fact Sheet for Healthcare Providers: GravelBags.it  This test is not yet approved or cleared by the [place] FDA and  has been authorized for detection and/or diagnosis of SARS-CoV-2 by  FDA under an Emergency Use Authorization (EUA). This EUA will remain  in effect (meaning this test can be used) for the duration of the  Covid-19 declaration under Section 564(b)(1) of the Act, 21  U.S.C. section 360bbb-3(b)(1), unless the authorization is  terminated or revoked. Performed at [facility], [place] [address] [place], [place] [address]   Blood Culture (routine x 2)     Status: None (Preliminary result)   Collection Time: 02/23/20  9:57 AM   Specimen: BLOOD RIGHT FOREARM  Result Value Ref Range Status   Specimen Description   Final    BLOOD RIGHT FOREARM Performed at [facility], [place] [address], [place], [place] [address]    Special Requests   Final    BOTTLES DRAWN AEROBIC AND ANAEROBIC Blood  Culture results may not be optimal due to an inadequate volume of blood received in culture bottles Performed at [facility] [address] [place], [place] [address]    Culture   Final    NO GROWTH 4 DAYS Performed at [facility], [place] [address], [place], [place] [address]    Report Status PENDING  Incomplete  Urine culture     Status: Abnormal   Collection Time: 02/23/20 12:09 PM   Specimen: In/Out Cath Urine  Result Value Ref Range Status   Specimen Description   Final    IN/OUT CATH URINE Performed at [facility] [address], [place], [place] [address]    Special Requests   Final    NONE Performed at [facility], [place] [address], [place], [place] [address]    Culture (A)  Final    <10,000 COLONIES/mL INSIGNIFICANT GROWTH Performed at [facility] [address], [place], [place] [address]    Report Status 02/24/2020 FINAL  Final  MRSA PCR Screening     Status: None   Collection Time: 02/25/20  7:26 AM   Specimen: Nasal Mucosa; Nasopharyngeal  Result Value Ref Range Status   MRSA by PCR NEGATIVE NEGATIVE Final    Comment:        The GeneXpert MRSA Assay (FDA approved for NASAL specimens only), is one component of a comprehensive MRSA colonization surveillance program. It is not intended to diagnose MRSA infection nor to guide or monitor treatment for MRSA infections. Performed at [facility], [place] [address] [place], [place] [address]     Radiology Reports CT ANGIO Delrae Sawyers & OR WO CONTRAST  Result Date: 02/24/2020 CLINICAL DATA:  Chest pain.  Peripheral vascular disease. EXAM: CT ANGIOGRAPHY OF ABDOMINAL AORTA WITH ILIOFEMORAL RUNOFF CT ANGIOGRAPHY OF ABDOMINAL AORTA WITH ILIOFEMORAL RUNOFF TECHNIQUE: Multidetector CT imaging of the abdomen, pelvis and lower extremities was performed using the standard protocol during bolus administration of intravenous contrast. Multiplanar CT  image reconstructions and MIPs were obtained to  evaluate the vascular anatomy. Multidetector CT imaging of the abdomen, pelvis and lower extremities was performed using the standard protocol during bolus administration of intravenous contrast. Multiplanar CT image reconstructions and MIPs were obtained to evaluate the vascular anatomy. CONTRAST:  171mL OMNIPAQUE IOHEXOL 350 MG/ML SOLN COMPARISON:  CT dated October 09, 2019. CT PE study dated 02/25/2019 FINDINGS: CHEST Cardiovascular: There is no evidence for large centrally located pulmonary embolism. Detection of smaller pulmonary emboli is limited by technique. There is no evidence for a thoracic aortic dissection or aneurysm. The heart size is enlarged. There is no significant pericardial effusion. There are mild coronary artery calcifications. Mediastinum/Nodes: --there are enlarged mediastinal lymph nodes. For example there is a pretracheal lymph node measuring approximately 1.8 x 2.9 cm (axial series 7, image 40). This is relatively similar to prior study. --mild hilar adenopathy is noted. -- No axillary lymphadenopathy. --there is significant right supraclavicular adenopathy which has progressed since the prior study. The dominant lymph node measures 1.2 by 3 point 4 cm (axial series 7, image 15). -- Normal thyroid gland where visualized. -  Unremarkable esophagus. Lungs/Pleura: There is a moderate to large right-sided pleural effusion. There is a small left-sided pleural effusion. There is bibasilar atelectasis. There is no pneumothorax. Musculoskeletal: No chest wall abnormality. No bony spinal canal stenosis. Review of the MIP images confirms the above findings. VASCULAR Aorta: There are atherosclerotic changes of the abdominal aorta without evidence for an aneurysm. Celiac: Patent without evidence of aneurysm, dissection, vasculitis or significant stenosis. SMA: Patent without evidence of aneurysm, dissection, vasculitis or significant stenosis. Renals: Both  renal arteries are patent without evidence of aneurysm, dissection, vasculitis, fibromuscular dysplasia or significant stenosis. IMA: Patent without evidence of aneurysm, dissection, vasculitis or significant stenosis. RIGHT Lower Extremity Inflow: Common, internal and external iliac arteries are patent without evidence of aneurysm, dissection, vasculitis or significant stenosis. Outflow: The right common femoral artery is patent. The right SFA is occluded proximally. There is near immediate reconstitution. There are tandem high-grade areas of stenosis throughout the right SFA. There is new complete occlusion of the mid to distal right SFA (axial series 7, image 247). Evaluation of the right popliteal artery is limited, however there appears to be high-grade stenosis throughout the vessel. Runoff: There is a high-grade stenosis of the proximal right anterior tibial artery. There appears to be a single vessel runoff to the right lower extremity via the anterior tibial artery. LEFT Lower Extremity Inflow: Common, internal and external iliac arteries are patent without evidence of aneurysm, dissection, vasculitis or significant stenosis. Outflow: The left CFA is widely patent. The proximal left SFA is entirely occluded. There is a new left femoral to below knee popliteal artery bypass graft. The graft is widely patent throughout its course. The profunda femoris artery is patent. Runoff: Heavy calcifications limit evaluation of the tibial vasculature. However there appears to be a 2 vessel runoff via the anterior tibial and posterior tibial arteries. The peroneal artery is occluded. Veins: The veins are not well evaluated on this study. Review of the MIP images confirms the above findings. NON-VASCULAR Hepatobiliary: There is hepatomegaly with likely underlying hepatic steatosis. There appears to be gallbladder wall thickening with pericholecystic free fluid.There is no biliary ductal dilation. Pancreas: Normal contours  without ductal dilatation. No peripancreatic fluid collection. Spleen: Unremarkable. Adrenals/Urinary Tract: --Adrenal glands: Unremarkable. --Right kidney/ureter: No hydronephrosis or radiopaque kidney stones. --Left kidney/ureter: No hydronephrosis or radiopaque kidney stones. --Urinary bladder: Unremarkable. Stomach/Bowel: --Stomach/Duodenum: No hiatal hernia or other gastric  abnormality. Normal duodenal course and caliber. --Small bowel: Unremarkable. --Colon: Unremarkable. --Appendix: Normal. Lymphatic: --No retroperitoneal lymphadenopathy. --No mesenteric lymphadenopathy. --there is mild bilateral inguinal adenopathy, left worse than right. Reproductive: Unremarkable Other: No ascites or free air. The abdominal wall is normal. Musculoskeletal. There are postsurgical changes of the left inguinal region. There is bilateral nonspecific lower extremity edema, left worse than right. There are small bilateral suprapatellar joint effusions. IMPRESSION: 1. Patent left lower extremity femoral to below knee popliteal artery bypass graft. Evaluation of the left tibial vasculature is limited by calcifications, however there appears to be a 2 vessel runoff to the left ankle via the anterior tibial and posterior tibial arteries. 2. Progressive peripheral vascular disease involving the right lower extremity. There is now complete occlusion of the mid to distal right superficial femoral artery, new since prior study. There are now appears to be a single vessel runoff to the right foot via the anterior tibial artery. In June, there was a 3 vessel runoff to the right foot. 3. Nonspecific, asymmetric bilateral lower extremity edema (left worse than right). This is of unknown clinical significance. This study cannot adequately exclude a DVT. If there is clinical suspicion for a DVT, follow-up with ultrasound is recommended. 4. No evidence for an acute pulmonary embolism. No evidence for thoracic aortic dissection or aneurysm. 5.  Cardiomegaly with small to moderate-sized bilateral pleural effusions, right worse than left. 6. Persistent mediastinal and hilar adenopathy with worsening right supraclavicular adenopathy. Findings may be reactive or due to a lymphoproliferative disorder or metastatic disease. Follow-up is recommended. Of note, the right supraclavicular lymph node is amenable to percutaneous ultrasound-guided biopsy if clinically indicated. 7. Hepatomegaly with likely underlying hepatic steatosis. 8.  Aortic Atherosclerosis (ICD10-I70.0). Electronically Signed   By: Constance Holster M.D.   On: 02/24/2020 18:13   US RENAL  Result Date: 02/26/2020 CLINICAL DATA:  Acute kidney injury. EXAM: RENAL / URINARY TRACT ULTRASOUND COMPLETE COMPARISON:  CT angiogram of the abdomen and pelvis from 02/24/2020. FINDINGS: Right Kidney: Renal measurements: 11.2 x 4.8 x 6.0 cm = volume: 169 mL. Mildly echogenic renal parenchyma, normal thickness. No hydronephrosis. No renal mass. Left Kidney: Renal measurements: 12.2 x 5.4 x 5.6 cm = volume: 184 mL. Mildly echogenic renal parenchyma, normal thickness. No hydronephrosis. No renal mass. Bladder: Appears normal for degree of bladder distention. Other: Incidentally noted diffuse gallbladder wall thickening. IMPRESSION: 1. No hydronephrosis. 2. Mildly echogenic normal size kidneys, compatible with reported history of nonspecific acute renal parenchymal disease. 3. Normal bladder. 4. Incidentally noted nonspecific diffuse gallbladder wall thickening. If there is clinical concern for acute cholecystitis, dedicated right upper quadrant abdominal sonogram could be obtained for further evaluation. Electronically Signed   By: Ilona Sorrel M.D.   On: 02/26/2020 10:20   DG Chest Port 1 View  Result Date: 02/27/2020 CLINICAL DATA:  Short of breath EXAM: PORTABLE CHEST 1 VIEW COMPARISON:  02/26/2020 FINDINGS: Cardiac enlargement and vascular congestion. Mild improvement in bilateral airspace disease  consistent with edema. No significant effusion. Left arm PICC tip in the SVC IMPRESSION: Congestive heart failure with mild interval improvement in edema. Electronically Signed   By: Franchot Gallo M.D.   On: 02/27/2020 08:02   DG Chest Port 1 View  Result Date: 02/26/2020 CLINICAL DATA:  Shortness of breath; history breast cancer, CHF, chronic kidney disease, COPD, diabetes mellitus, hypertension EXAM: PORTABLE CHEST 1 VIEW COMPARISON:  Portable exam 0729 hours compared to 02/25/2020 FINDINGS: LEFT arm PICC line tip projects over SVC.  Enlargement of cardiac silhouette. Mediastinal contours normal. BILATERAL pulmonary infiltrates identified, basilar predominance, favor multifocal pneumonia over pulmonary edema. Subsegmental atelectasis lower LEFT lung. Question minimal LEFT pleural effusion. No pneumothorax. Endplate spur formation thoracic spine. IMPRESSION: Persistent BILATERAL pulmonary infiltrates, predominantly basilar, favoring multifocal pneumonia. Electronically Signed   By: Lavonia Dana M.D.   On: 02/26/2020 07:56   DG Chest Port 1 View  Result Date: 02/25/2020 CLINICAL DATA:  Shortness of breath, on BiPAP EXAM: PORTABLE CHEST 1 VIEW COMPARISON:  Portable exam at 1039 hrs compared to 02/23/2020 FINDINGS: Enlargement of cardiac silhouette with pulmonary vascular congestion. Diffuse infiltrates throughout both lungs, greater at the lower lungs bilaterally, question multifocal pneumonia pulmonary edema considered less likely. No pleural effusion or pneumothorax. Osseous structures unremarkable IMPRESSION: Enlargement of cardiac silhouette with slight pulmonary vascular congestion. Diffuse BILATERAL pulmonary infiltrates greater at lower lungs bilaterally, favor multifocal pneumonia or less likely pulmonary edema. Electronically Signed   By: Lavonia Dana M.D.   On: 02/25/2020 10:49   DG Chest Port 1 View  Result Date: 02/23/2020 CLINICAL DATA:  Concern for sepsis EXAM: PORTABLE CHEST 1 VIEW  COMPARISON:  10/09/2019 FINDINGS: Increased bibasilar airspace opacities worse on the left obscuring the left hemidiaphragm concerning for bibasilar pneumonia. Stable cardiomegaly. No CHF pattern or large effusion. No pneumothorax. Aorta atherosclerotic. IMPRESSION: Bibasilar airspace process, worse on the left concerning for pneumonia. Electronically Signed   By: Jerilynn Mages.  Shick M.D.   On: 02/23/2020 10:24   DG Foot 2 Views Left  Result Date: 02/23/2020 CLINICAL DATA:  Chronic wound EXAM: LEFT FOOT - 2 VIEW COMPARISON:  Sep 21, 2019 FINDINGS: Frontal and lateral views were obtained. There is soft tissue swelling. There is suggestion of soft tissue air medial to the distal aspect of the first metatarsal. There is no erosive change or osteomyelitis. No fracture or dislocation. Joint spaces overall appear unremarkable. There are prominent posterior and inferior calcaneal spurs. IMPRESSION: Soft tissue swelling with soft tissue air medial to the mid to distal first metatarsal. No bony destruction or erosion. No appreciable joint space narrowing. There are calcaneal spurs. No fracture or dislocation evident. Electronically Signed   By: Lowella Grip III M.D.   On: 02/23/2020 10:22   CT ANGIO CHEST AORTA W/CM &/OR WO/CM  Result Date: 02/24/2020 CLINICAL DATA:  Chest pain.  Peripheral vascular disease. EXAM: CT ANGIOGRAPHY OF ABDOMINAL AORTA WITH ILIOFEMORAL RUNOFF CT ANGIOGRAPHY OF ABDOMINAL AORTA WITH ILIOFEMORAL RUNOFF TECHNIQUE: Multidetector CT imaging of the abdomen, pelvis and lower extremities was performed using the standard protocol during bolus administration of intravenous contrast. Multiplanar CT image reconstructions and MIPs were obtained to evaluate the vascular anatomy. Multidetector CT imaging of the abdomen, pelvis and lower extremities was performed using the standard protocol during bolus administration of intravenous contrast. Multiplanar CT image reconstructions and MIPs were obtained to  evaluate the vascular anatomy. CONTRAST:  174mL OMNIPAQUE IOHEXOL 350 MG/ML SOLN COMPARISON:  CT dated October 09, 2019. CT PE study dated 02/25/2019 FINDINGS: CHEST Cardiovascular: There is no evidence for large centrally located pulmonary embolism. Detection of smaller pulmonary emboli is limited by technique. There is no evidence for a thoracic aortic dissection or aneurysm. The heart size is enlarged. There is no significant pericardial effusion. There are mild coronary artery calcifications. Mediastinum/Nodes: --there are enlarged mediastinal lymph nodes. For example there is a pretracheal lymph node measuring approximately 1.8 x 2.9 cm (axial series 7, image 40). This is relatively similar to prior study. --mild hilar adenopathy is noted. --  No axillary lymphadenopathy. --there is significant right supraclavicular adenopathy which has progressed since the prior study. The dominant lymph node measures 1.2 by 3 point 4 cm (axial series 7, image 15). -- Normal thyroid gland where visualized. -  Unremarkable esophagus. Lungs/Pleura: There is a moderate to large right-sided pleural effusion. There is a small left-sided pleural effusion. There is bibasilar atelectasis. There is no pneumothorax. Musculoskeletal: No chest wall abnormality. No bony spinal canal stenosis. Review of the MIP images confirms the above findings. VASCULAR Aorta: There are atherosclerotic changes of the abdominal aorta without evidence for an aneurysm. Celiac: Patent without evidence of aneurysm, dissection, vasculitis or significant stenosis. SMA: Patent without evidence of aneurysm, dissection, vasculitis or significant stenosis. Renals: Both renal arteries are patent without evidence of aneurysm, dissection, vasculitis, fibromuscular dysplasia or significant stenosis. IMA: Patent without evidence of aneurysm, dissection, vasculitis or significant stenosis. RIGHT Lower Extremity Inflow: Common, internal and external iliac arteries are patent  without evidence of aneurysm, dissection, vasculitis or significant stenosis. Outflow: The right common femoral artery is patent. The right SFA is occluded proximally. There is near immediate reconstitution. There are tandem high-grade areas of stenosis throughout the right SFA. There is new complete occlusion of the mid to distal right SFA (axial series 7, image 247). Evaluation of the right popliteal artery is limited, however there appears to be high-grade stenosis throughout the vessel. Runoff: There is a high-grade stenosis of the proximal right anterior tibial artery. There appears to be a single vessel runoff to the right lower extremity via the anterior tibial artery. LEFT Lower Extremity Inflow: Common, internal and external iliac arteries are patent without evidence of aneurysm, dissection, vasculitis or significant stenosis. Outflow: The left CFA is widely patent. The proximal left SFA is entirely occluded. There is a new left femoral to below knee popliteal artery bypass graft. The graft is widely patent throughout its course. The profunda femoris artery is patent. Runoff: Heavy calcifications limit evaluation of the tibial vasculature. However there appears to be a 2 vessel runoff via the anterior tibial and posterior tibial arteries. The peroneal artery is occluded. Veins: The veins are not well evaluated on this study. Review of the MIP images confirms the above findings. NON-VASCULAR Hepatobiliary: There is hepatomegaly with likely underlying hepatic steatosis. There appears to be gallbladder wall thickening with pericholecystic free fluid.There is no biliary ductal dilation. Pancreas: Normal contours without ductal dilatation. No peripancreatic fluid collection. Spleen: Unremarkable. Adrenals/Urinary Tract: --Adrenal glands: Unremarkable. --Right kidney/ureter: No hydronephrosis or radiopaque kidney stones. --Left kidney/ureter: No hydronephrosis or radiopaque kidney stones. --Urinary bladder:  Unremarkable. Stomach/Bowel: --Stomach/Duodenum: No hiatal hernia or other gastric abnormality. Normal duodenal course and caliber. --Small bowel: Unremarkable. --Colon: Unremarkable. --Appendix: Normal. Lymphatic: --No retroperitoneal lymphadenopathy. --No mesenteric lymphadenopathy. --there is mild bilateral inguinal adenopathy, left worse than right. Reproductive: Unremarkable Other: No ascites or free air. The abdominal wall is normal. Musculoskeletal. There are postsurgical changes of the left inguinal region. There is bilateral nonspecific lower extremity edema, left worse than right. There are small bilateral suprapatellar joint effusions. IMPRESSION: 1. Patent left lower extremity femoral to below knee popliteal artery bypass graft. Evaluation of the left tibial vasculature is limited by calcifications, however there appears to be a 2 vessel runoff to the left ankle via the anterior tibial and posterior tibial arteries. 2. Progressive peripheral vascular disease involving the right lower extremity. There is now complete occlusion of the mid to distal right superficial femoral artery, new since prior study. There are now appears  to be a single vessel runoff to the right foot via the anterior tibial artery. In June, there was a 3 vessel runoff to the right foot. 3. Nonspecific, asymmetric bilateral lower extremity edema (left worse than right). This is of unknown clinical significance. This study cannot adequately exclude a DVT. If there is clinical suspicion for a DVT, follow-up with ultrasound is recommended. 4. No evidence for an acute pulmonary embolism. No evidence for thoracic aortic dissection or aneurysm. 5. Cardiomegaly with small to moderate-sized bilateral pleural effusions, right worse than left. 6. Persistent mediastinal and hilar adenopathy with worsening right supraclavicular adenopathy. Findings may be reactive or due to a lymphoproliferative disorder or metastatic disease. Follow-up is  recommended. Of note, the right supraclavicular lymph node is amenable to percutaneous ultrasound-guided biopsy if clinically indicated. 7. Hepatomegaly with likely underlying hepatic steatosis. 8.  Aortic Atherosclerosis (ICD10-I70.0). Electronically Signed   By: Constance Holster M.D.   On: 02/24/2020 18:13   VAS [place] LOWER EXTREMITY VENOUS (DVT)  Result Date: 02/25/2020  Lower Venous DVTStudy Indications: Edema.  Risk Factors: Surgery 01-26-2020 LT balloon angioplasty, 10-14-2019 LT fem-pop bypass graft using non-reversed great saphenous vein. Limitations: Body habitus, poor ultrasound/tissue interface and patient intolerant to probe pressure. Comparison Study: No prior studies. Performing Technologist: Darlin Coco  Examination Guidelines: A complete evaluation includes B-mode imaging, spectral Doppler, color Doppler, and power Doppler as needed of all accessible portions of each vessel. Bilateral testing is considered an integral part of a complete examination. Limited examinations for reoccurring indications may be performed as noted. The reflux portion of the exam is performed with the patient in reverse Trendelenburg.  +-----+---------------+---------+-----------+----------+--------------+ RIGHTCompressibilityPhasicitySpontaneityPropertiesThrombus Aging +-----+---------------+---------+-----------+----------+--------------+ CFV  Full           Yes      Yes                                 +-----+---------------+---------+-----------+----------+--------------+   +---------+---------------+---------+-----------+----------+---------------+ LEFT     CompressibilityPhasicitySpontaneityPropertiesThrombus Aging  +---------+---------------+---------+-----------+----------+---------------+ CFV      Full           Yes      Yes                                  +---------+---------------+---------+-----------+----------+---------------+ SFJ      Full                                                          +---------+---------------+---------+-----------+----------+---------------+ FV Prox  Full                                                         +---------+---------------+---------+-----------+----------+---------------+ FV Mid                  Yes      Yes                  Patent by color +---------+---------------+---------+-----------+----------+---------------+ FV Distal               Yes  Yes                  Patent by color +---------+---------------+---------+-----------+----------+---------------+ PFV      Full                                                         +---------+---------------+---------+-----------+----------+---------------+ POP      Full           Yes      Yes                                  +---------+---------------+---------+-----------+----------+---------------+ PTV      Full                                                         +---------+---------------+---------+-----------+----------+---------------+ PERO                    Yes      Yes                  Patent by color +---------+---------------+---------+-----------+----------+---------------+     Summary: RIGHT: - No evidence of common femoral vein obstruction.  LEFT: - There is no evidence of deep vein thrombosis in the lower extremity. However, portions of this examination were limited- see technologist comments above.  - No cystic structure found in the popliteal fossa.  *See table(s) above for measurements and observations. Electronically signed by Harold Barban MD on 02/25/2020 at 8:54:52 PM.    Final    [place] EKG SITE RITE  Result Date: 02/25/2020 If Site Rite image not attached, placement could not be confirmed due to current cardiac rhythm.  US Abdomen Limited RUQ (LIVER/GB)  Result Date: 02/28/2020 CLINICAL DATA:  Transaminitis, EXAM: ULTRASOUND ABDOMEN LIMITED RIGHT UPPER QUADRANT COMPARISON:  None. FINDINGS: Gallbladder: The  gallbladder is mildly distended and there is pericholecystic fluid identified within the a gallbladder fossa. A small amount of layering sludge is seen within the gallbladder neck. There is no associated gallbladder wall thickening. The sonographic Percell Miller sign is reportedly positive. Common bile duct: Diameter: 3 mm Liver: Liver parenchymal echogenicity and echotexture is normal. No focal intrahepatic masses are seen and there is no intrahepatic biliary ductal dilation. Portal vein is patent on color Doppler imaging with normal direction of blood flow towards the liver. Other: No ascites IMPRESSION: Mild distention of the gallbladder, gallbladder sludge, and pericholecystic fluid identified. Reportedly positive sonographic Murphy sign. Together, the findings are suspicious for changes of acute, calculus cholecystitis. Electronically Signed   By: Fidela Salisbury MD   On: 02/28/2020 01:23

## 2020-02-29 ENCOUNTER — Inpatient Hospital Stay (HOSPITAL_COMMUNITY): Payer: Medicare Other

## 2020-02-29 DIAGNOSIS — R6521 Severe sepsis with septic shock: Secondary | ICD-10-CM | POA: Diagnosis not present

## 2020-02-29 DIAGNOSIS — A419 Sepsis, unspecified organism: Secondary | ICD-10-CM | POA: Diagnosis not present

## 2020-02-29 DIAGNOSIS — I639 Cerebral infarction, unspecified: Secondary | ICD-10-CM | POA: Diagnosis not present

## 2020-02-29 LAB — CBC WITH DIFFERENTIAL/PLATELET
Abs Immature Granulocytes: 0.11 10*3/uL — ABNORMAL HIGH (ref 0.00–0.07)
Basophils Absolute: 0 10*3/uL (ref 0.0–0.1)
Basophils Relative: 0 %
Eosinophils Absolute: 0.1 10*3/uL (ref 0.0–0.5)
Eosinophils Relative: 1 %
HCT: 32.2 % — ABNORMAL LOW (ref 36.0–46.0)
Hemoglobin: 9.9 g/dL — ABNORMAL LOW (ref 12.0–15.0)
Immature Granulocytes: 1 %
Lymphocytes Relative: 11 %
Lymphs Abs: 0.9 10*3/uL (ref 0.7–4.0)
MCH: 25.1 pg — ABNORMAL LOW (ref 26.0–34.0)
MCHC: 30.7 g/dL (ref 30.0–36.0)
MCV: 81.5 fL (ref 80.0–100.0)
Monocytes Absolute: 0.9 10*3/uL (ref 0.1–1.0)
Monocytes Relative: 12 %
Neutro Abs: 5.7 10*3/uL (ref 1.7–7.7)
Neutrophils Relative %: 75 %
Platelets: 378 10*3/uL (ref 150–400)
RBC: 3.95 MIL/uL (ref 3.87–5.11)
RDW: 18.8 % — ABNORMAL HIGH (ref 11.5–15.5)
WBC: 7.7 10*3/uL (ref 4.0–10.5)
nRBC: 1.4 % — ABNORMAL HIGH (ref 0.0–0.2)

## 2020-02-29 LAB — COMPREHENSIVE METABOLIC PANEL
ALT: 341 U/L — ABNORMAL HIGH (ref 0–44)
AST: 758 U/L — ABNORMAL HIGH (ref 15–41)
Albumin: 1.4 g/dL — ABNORMAL LOW (ref 3.5–5.0)
Alkaline Phosphatase: 290 U/L — ABNORMAL HIGH (ref 38–126)
Anion gap: 7 (ref 5–15)
BUN: 39 mg/dL — ABNORMAL HIGH (ref 6–20)
CO2: 23 mmol/L (ref 22–32)
Calcium: 7.6 mg/dL — ABNORMAL LOW (ref 8.9–10.3)
Chloride: 104 mmol/L (ref 98–111)
Creatinine, Ser: 2.73 mg/dL — ABNORMAL HIGH (ref 0.44–1.00)
GFR, Estimated: 20 mL/min — ABNORMAL LOW (ref >60)
Glucose, Bld: 80 mg/dL (ref 70–99)
Potassium: 4.7 mmol/L (ref 3.5–5.1)
Sodium: 134 mmol/L — ABNORMAL LOW (ref 135–145)
Total Bilirubin: 0.6 mg/dL (ref 0.3–1.2)
Total Protein: 4.9 g/dL — ABNORMAL LOW (ref 6.5–8.1)

## 2020-02-29 LAB — GLUCOSE, CAPILLARY
Glucose-Capillary: 80 mg/dL (ref 70–99)
Glucose-Capillary: 68 mg/dL — ABNORMAL LOW (ref 70–99)
Glucose-Capillary: 149 mg/dL — ABNORMAL HIGH (ref 70–99)
Glucose-Capillary: 84 mg/dL (ref 70–99)
Glucose-Capillary: 87 mg/dL (ref 70–99)

## 2020-02-29 LAB — MAGNESIUM: Magnesium: 2.3 mg/dL (ref 1.7–2.4)

## 2020-02-29 LAB — BRAIN NATRIURETIC PEPTIDE: B Natriuretic Peptide: 635.9 pg/mL — ABNORMAL HIGH (ref 0.0–100.0)

## 2020-02-29 LAB — PROTIME-INR
INR: 2.1 — ABNORMAL HIGH (ref 0.8–1.2)
Prothrombin Time: 22.8 seconds — ABNORMAL HIGH (ref 11.4–15.2)

## 2020-02-29 MED ORDER — LACTATED RINGERS IV SOLN: INTRAVENOUS | Status: AC

## 2020-02-29 MED ORDER — INSULIN GLARGINE 100 UNIT/ML ~~LOC~~ SOLN
20.0000 [IU] | Freq: Every day | SUBCUTANEOUS | Status: DC
  Administered 2020-03-02 – 2020-03-09 (×8): 20 [IU] via SUBCUTANEOUS
  Filled 2020-02-29 (×11): qty 0.2

## 2020-02-29 MED ORDER — GLUCOSE 40 % PO GEL: 1.0000 | Freq: Once | ORAL | Status: DC | PRN

## 2020-02-29 MED ORDER — NALOXONE HCL 0.4 MG/ML IJ SOLN
INTRAMUSCULAR | Status: AC
  Filled 2020-02-29: qty 1

## 2020-02-29 MED ORDER — NALOXONE HCL 0.4 MG/ML IJ SOLN
0.4000 mg | INTRAMUSCULAR | Status: DC | PRN
  Administered 2020-02-29: 0.1 mg via INTRAVENOUS

## 2020-02-29 MED ORDER — STROKE: EARLY STAGES OF RECOVERY BOOK
Freq: Once | Status: AC
  Filled 2020-02-29: qty 1

## 2020-02-29 NOTE — Evaluation (Signed)
Clinical/Bedside Swallow Evaluation Patient Details  Name: Morgan Beasley MRN: [id number] Date of Birth: October 14, 1960  Today's Date: 02/29/2020 Time: SLP Start Time (ACUTE ONLY): 7893 SLP Stop Time (ACUTE ONLY): 1842 SLP Time Calculation (min) (ACUTE ONLY): 17 min  Past Medical History:  Past Medical History:  Diagnosis Date  . Alcohol dependence ([place])   . Anemia   . Anxiety   . Breast cancer ([place])   . Chronic combined systolic and diastolic CHF (congestive heart failure) ([place])   . Cigarette nicotine dependence   . CKD (chronic kidney disease), stage III ([place])   . Colon polyps   . COPD (chronic obstructive pulmonary disease) ([place])   . Diabetes mellitus without complication ([place])   . Diabetic neuropathy ([place])   . Elevated lipase   . Gout   . Hyperlipemia   . Hypertension   . Insomnia   . Lymphedema   . Marijuana use   . Mild CAD 2016   a. NSTEMI 2016 in context of cocaine abuse, 50% RCA% at that time.  . OSA treated with BiPAP   . PAD (peripheral artery disease) ([place])    a. s/p L SFA stenting 08/2019. b. left fem-to-below-knee-popliteal bypass 09/2019.  Marland Kitchen Sleep apnea    wears BIPAP  . Ulcer of foot ([place])    right   Past Surgical History:  Past Surgical History:  Procedure Laterality Date  . ABDOMINAL AORTOGRAM W/LOWER EXTREMITY N/A 09/26/2019   Procedure: ABDOMINAL AORTOGRAM W/LOWER EXTREMITY;  Surgeon: Angelia Mould, MD;  Location: [facility];  Service: Cardiovascular;  Laterality: N/A;  . ABDOMINAL AORTOGRAM W/LOWER EXTREMITY Bilateral 12/08/2019   Procedure: ABDOMINAL AORTOGRAM W/LOWER EXTREMITY;  Surgeon: Waynetta Sandy, MD;  Location: [facility];  Service: Cardiovascular;  Laterality: Bilateral;  . ABDOMINAL AORTOGRAM W/LOWER EXTREMITY Left 01/26/2020   Procedure: ABDOMINAL AORTOGRAM W/LOWER EXTREMITY;  Surgeon: Waynetta Sandy, MD;  Location: [facility];  Service: Cardiovascular;  Laterality: Left;  . ABDOMINAL  HYSTERECTOMY    . [facility]  . FEMORAL-POPLITEAL BYPASS GRAFT Left 10/14/2019   Procedure: BYPASS GRAFT LEFT FEMORAL-POPLITEAL ARTERY USING NONREVERSED SAPHENOUS VEIN;  Surgeon: Waynetta Sandy, MD;  Location: [facility];  Service: Vascular;  Laterality: Left;  Marland Kitchen MASTECTOMY Right April 2016  . PERIPHERAL VASCULAR ATHERECTOMY Left 01/26/2020   Procedure: PERIPHERAL VASCULAR ATHERECTOMY;  Surgeon: Waynetta Sandy, MD;  Location: [facility];  Service: Cardiovascular;  Laterality: Left;  PT and AT - [facility]  . PERIPHERAL VASCULAR BALLOON ANGIOPLASTY Left 01/26/2020   Procedure: PERIPHERAL VASCULAR BALLOON ANGIOPLASTY;  Surgeon: Waynetta Sandy, MD;  Location: [facility];  Service: Cardiovascular;  Laterality: Left;  [facility]   . PERIPHERAL VASCULAR INTERVENTION Left 09/26/2019   Procedure: PERIPHERAL VASCULAR INTERVENTION;  Surgeon: Angelia Mould, MD;  Location: [facility];  Service: Cardiovascular;  Laterality: Left;  superficial femoral   HPI:  Pt is a 59 y.o. female with past medical history of prior substance abuse (THC, cocaine),  obesity, poorly controlled diabetes mellitus, CAD, systolic/diastolic CHFongoing tobacco abuse, stage II CKD, COPD not on home oxygen, OSA on nightly BiPAP, and hypertension plus PVD/PAD status post angioplasty and stent placement in the past and left femoral to below-knee popliteal bypass 09/2019 with nonhealing left foot ulcer who presented the ED with c/o left leg and foot pain but was a poor historian at the time.  CT head was done for encephalopathy and revealed  a subacute right occipital infarct.  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   Assessment / Plan / Recommendation Clinical Impression  Pt was seen for bedside swallow evaluation. She demonstrated confusion and was unable to follow all the necessary commands for an oral  mechanism exam. Speech intelligibility was initially reduced but improved during the evaluation. Pt was edentulous and RN indicated that her dentures were lost during a prior admission. She tolerated all solids and liquids without signs or symptoms of aspiration. Mastication was prolonged, likely due to edentulous status. A dysphagia 2 diet with thin liquids is recommended at this time. RN indicated that although the pt is currently NPO for the swallow  evaluation, she will need to be NPO after midnight for a procedure. She therefore requested that the diet order not be placed. SLP will follow to assess diet tolerance and her ability to tolerate more advanced consistencies.  SLP Visit Diagnosis: Dysphagia, unspecified (R13.10)    Aspiration Risk  Mild aspiration risk    Diet Recommendation Dysphagia 2 (Fine chop);Thin liquid   Liquid Administration via: Cup;Straw Medication Administration: Crushed with puree Compensations: Slow rate;Small sips/bites Postural Changes: Seated upright at 90 degrees    Other  Recommendations Oral Care Recommendations: Oral care BID   Follow up Recommendations Other (comment) (TBD)      Frequency and Duration min 2x/week  2 weeks       Prognosis Prognosis for Safe Diet Advancement: Good Barriers to Reach Goals: Cognitive deficits      Swallow Study   General Date of Onset: 02/29/20 HPI: Pt is a 59 y.o. female with past medical history of prior substance abuse (THC, cocaine),  obesity, poorly controlled diabetes mellitus, CAD, systolic/diastolic CHFongoing tobacco abuse, stage II CKD, COPD not on home oxygen, OSA on nightly BiPAP, and hypertension plus PVD/PAD status post angioplasty and stent placement in the past and left femoral to below-knee popliteal bypass 09/2019 with nonhealing left foot ulcer who presented the ED with c/o left leg and foot pain but was a poor historian at the time.  CT head was done for encephalopathy and revealed a subacute right  occipital infarct.  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 Type of Study: Bedside Swallow Evaluation Previous Swallow Assessment: none Diet Prior to this Study: NPO Temperature Spikes Noted: No Respiratory Status: Nasal cannula History of Recent Intubation: No Behavior/Cognition: Alert;Pleasant mood;Confused Oral Cavity Assessment: Within Functional Limits Oral Care Completed by SLP: Recent completion by staff Oral Cavity - Dentition: Edentulous Vision: Functional for self-feeding Self-Feeding Abilities: Needs assist Patient Positioning: Upright in bed;Postural control adequate for testing Baseline Vocal Quality: Other (comment) (strained) Volitional Cough: Weak Volitional Swallow: Able to elicit    Oral/Motor/Sensory Function Overall Oral Motor/Sensory Function:  (Pt exhibited difficulty following commands)   Ice Chips Ice chips: Within functional limits Presentation: Spoon   Thin Liquid Thin Liquid: Within functional limits Presentation: Straw;Cup    Nectar Thick Nectar Thick Liquid: Not tested   Honey Thick Honey Thick Liquid: Not tested   Puree Puree: Within functional limits Presentation: Spoon   Solid     Solid: Impaired Oral Phase Impairments: Impaired mastication     Brodyn Depuy I. Hardin Negus, [employer] [place] Office number [phone] Pager [place] 02/29/2020,6:43 PM

## 2020-02-29 NOTE — Progress Notes (Addendum)
PROGRESS NOTE                                                                                                                                                                                                             Patient Demographics:    Morgan Beasley, is a 59 y.o. female, DOB - 15-Nov-1960, QAS:[id number]  Outpatient Primary MD for the patient is Cullop, [name], NP    LOS - 6  Admit date - 02/23/2020    Chief Complaint  Patient presents with  . Wound Infection       Brief Narrative (HPI from H&P)  - 59 year old female, lives alone but her daughter checks on her regularly, ambulates with the help of a walker with seat, PMH of chronic combined systolic and diastolic CHF, ongoing tobacco abuse, stage II CKD, COPD not on home oxygen, OSA on nightly BiPAP, poorly controlled DM2 with peripheral neuropathy, gout, HLD, HTN, prior substance abuse (THC, cocaine), PAD s/p L SFA stenting 08/2019 and left femoral to below-knee popliteal bypass 09/2019 with nonhealing left foot ulcer, mild CAD presented initially to the [facility] ED on 10/25 due to leg and foot pain.  Admitted for sepsis/Serratia bacteremia, left lower extremity cellulitis complicating chronic nonhealing ulcer, persistent lactic acidosis and concern for critical left lower extremity ischemia.  Vascular surgery was consulted and she was transferred from [facility] hospital to [facility] under my care on 02/25/2020.   Subjective:   Patient in bed, slightly drowsy, denies any headache, denies any weakness, no shortness of breath, does follow commands but after a pause   Assessment  & Plan :    1.  Septic shock caused by Serratia bacteremia due to bilateral lower extremity PAD with chronic left lower extremity nonhealing ulcer in a diabetic patient.  She has been treated with IV fluids along with IV cefepime and oral Flagyl.  Vascular surgery following, sepsis  pathophysiology seems to have improved, continue supportive care with antibiotics and wound care, continue hydration with IV fluids and minimize narcotic use as much as possible.  Defer further management of PAD issues to vascular surgery.  2.  PAD s/p left femoropopliteal bypass in 10/19/2019, CAD.  On Plavix and statin for secondary prevention further assess #1 above.  3.   Incidental finding of reactive mediastinal hilar and right supraclavicular adenopathy.  Request PCP to arrange for outpatient biopsy.  4.  Dyslipidemia.  On statin and Zetia.  5.  Chronic narcotic use.  Mildly somnolent, Narcan as needed, reduce narcotics and use with caution, patient and daughter updated on 02/25/2020.  6.  COPD and smoking.  Counseled to quit smoking, supportive care, no wheezing.  7.  Obesity with OSA.  BMI of 32, follow with PCP, use BiPAP whenever she sleeps day or night.  High risk for CO2 retention.  8.  Essential hypertension.  Hold beta-blocker due to hypotension, IV fluids.  9.  Chronic combined systolic and diastolic CHF.  Echo done few months ago showing a EF of 40% with global hypokinesis.  Continue Plavix, statin and beta-blocker for secondary prevention.  10.  AKI with CKD stage IIIa.  Baseline creatinine around 1.4, AKI worse due to a combination of IV contrast for CTA, dehydration and hypotension, hold beta-blocker, continue hydration with IV fluids on 02/29/2020, nonacute renal ultrasound, urine sodium less than 10, FeNA < 1.  Monitor.  11.  Anemia of chronic disease.  Mild drop due to heme dilution.  Monitor.  12. Poor IV access - PICC needed for IV  meds and ABX, CKD 3 A - benefits outweigh the risks.  13.  Elevated transaminases 02/27/2020.  Could be shock liver due to hypotension, she is completely pain and symptom-free, right upper quadrant ultrasound showed some nonspecific changes, seen by general surgery, also INR is high for which she will get vitamin K on 02/28/2020, acute  hepatitis panel negative, will check abdominal venous ultrasound also to evaluate portal and hepatic veins, continue to monitor.  14. Hypokalemia , Hypomagnesemia - replaced and stable  15.  Metabolic encephalopathy.  CBGs were low, Lantus held, hypoglycemia protocol followed, will also try low-dose Narcan, discontinue Neurontin and Elavil, check head CT.  She did wear BiPAP last night and I do not think this is CO2 narcosis, previous episode this hospitalization also showed stable ABGs, monitor.  Addendum.  Head CT shows subacute CVA.  Could be embolic versus watershed due to hypotension during her earlier course of admission, continue IV fluids.  Neurology consulted.  78. DM 2, poor outpatient control due to hyperglycemia.  With A1c of 15, on Lantus and sliding scale monitor and adjust.  Lab Results  Component Value Date   HGBA1C 15.1 (H) 02/23/2020   CBG (last 3)  Recent Labs    02/28/20 2010 02/29/20 0216 02/29/20 0803  GLUCAP 98 80 68*       Condition - Extremely Guarded  Family Communication  [name] - [phone] - on 02/25/20  Code Status :  Full  Consults  :  VVS - Dr. Oneida Alar, general surgery  Procedures  :    PICC line placement 02/25/2020.    Leg [place] - No DVT  RUQ [place] -  Renal US - 1. No hydronephrosis. 2. Mildly echogenic normal size kidneys, compatible with reported history of nonspecific acute renal parenchymal disease. 3. Normal bladder. 4. Incidentally noted nonspecific diffuse gallbladder wall thickening. If there is clinical concern for acute cholecystitis, dedicated right upper quadrant abdominal sonogram could be obtained for further evaluation  CTA -  IMPRESSION: 1. Patent left lower extremity femoral to below knee popliteal artery bypass graft. Evaluation of the left tibial vasculature is limited by calcifications, however there appears to be a 2 vessel runoff to the left ankle via the anterior tibial and posterior tibial arteries. 2. Progressive  peripheral vascular disease involving the right  lower extremity. There is now complete occlusion of the mid to distal right superficial femoral artery, new since prior study. There are now appears to be a single vessel runoff to the right foot via the anterior tibial artery. In June, there was a 3 vessel runoff to the right foot. 3. Nonspecific, asymmetric bilateral lower extremity edema (left worse than right). This is of unknown clinical significance. This study cannot adequately exclude a DVT. If there is clinical suspicion for a DVT, follow-up with ultrasound is recommended. 4. No evidence for an acute pulmonary embolism. No evidence for thoracic aortic dissection or aneurysm. 5. Cardiomegaly with small to moderate-sized bilateral pleural effusions, right worse than left. 6. Persistent mediastinal and hilar adenopathy with worsening right supraclavicular adenopathy. Findings may be reactive or due to a lymphoproliferative disorder or metastatic disease. Follow-up is recommended. Of note, the right supraclavicular lymph node is amenable to percutaneous ultrasound-guided biopsy if clinically indicated. 7. Hepatomegaly with likely underlying hepatic steatosis. 8.  Aortic Atherosclerosis   PUD Prophylaxis : PPI  Disposition Plan  :    Status is: Inpatient  Remains inpatient appropriate because:Inpatient level of care appropriate due to severity of illness   Dispo: The patient is from: Home              Anticipated d/c is to: Home              Anticipated d/c date is: > 3 days              Patient currently is medically stable to d/c.   DVT Prophylaxis  :  Heparin    Lab Results  Component Value Date   PLT 378 02/29/2020    Diet :  Diet Order            Diet NPO time specified Except for: Sips with Meds  Diet effective now                  Inpatient Medications  Scheduled Meds: . Chlorhexidine Gluconate Cloth  6 each Topical Daily  . clopidogrel  75 mg Oral Daily  . collagenase    Topical Daily  . fluticasone furoate-vilanterol  1 puff Inhalation Daily  . [START ON 03/01/2020] heparin injection (subcutaneous)  5,000 Units Subcutaneous Q8H  . insulin aspart  0-9 Units Subcutaneous Q6H  . insulin glargine  20 Units Subcutaneous Daily  . lipase/protease/amylase  72,000 Units Oral TID WC  . pantoprazole  40 mg Oral Daily   Continuous Infusions: . ceFEPime (MAXIPIME) IV 2 g (02/29/20 0525)  . lactated ringers Stopped (02/26/20 1041)  . lactated ringers 75 mL/hr at 02/29/20 0835   PRN Meds:.acetaminophen **OR** [DISCONTINUED] acetaminophen, dextrose, lipase/protease/amylase, naLOXone (NARCAN)  injection, [DISCONTINUED] ondansetron **OR** ondansetron (ZOFRAN) IV, polyethylene glycol  Antibiotics  :    Anti-infectives (From admission, onward)   Start     Dose/Rate Route Frequency Ordered Stop   02/28/20 0546  ceFEPIme (MAXIPIME) 2 g in sodium chloride 0.9 % 100 mL IVPB        2 g 200 mL/hr over 30 Minutes Intravenous Every 24 hours 02/27/20 1041     02/25/20 1400  metroNIDAZOLE (FLAGYL) tablet 500 mg  Status:  Discontinued        500 mg Oral Every 8 hours 02/25/20 1025 02/29/20 0911   02/24/20 1800  ceFEPIme (MAXIPIME) 2 g in sodium chloride 0.9 % 100 mL IVPB  Status:  Discontinued        2 g 200 mL/hr  over 30 Minutes Intravenous Every 12 hours 02/24/20 0732 02/27/20 1041   02/24/20 0600  ceFEPIme (MAXIPIME) 2 g in sodium chloride 0.9 % 100 mL IVPB  Status:  Discontinued        2 g 200 mL/hr over 30 Minutes Intravenous Every 8 hours 02/24/20 0440 02/24/20 0732   02/23/20 1015  vancomycin (VANCOREADY) IVPB 1750 mg/350 mL        1,750 mg 175 mL/hr over 120 Minutes Intravenous  Once 02/23/20 1007 02/23/20 1347   02/23/20 1000  cefTRIAXone (ROCEPHIN) 2 g in sodium chloride 0.9 % 100 mL IVPB  Status:  Discontinued        2 g 200 mL/hr over 30 Minutes Intravenous Every 24 hours 02/23/20 0952 02/24/20 0440   02/23/20 1000  metroNIDAZOLE (FLAGYL) IVPB 500 mg  Status:   Discontinued        500 mg 100 mL/hr over 60 Minutes Intravenous Every 8 hours 02/23/20 0952 02/25/20 1025       Time Spent in minutes  30   Lala Lund M.D on 02/29/2020 at 9:14 AM  To page go to [email or web address] - password [facility]  [facility] -  Office  [phone]    See all Orders from today for further details    Objective:   Vitals:   02/29/20 0540 02/29/20 0551 02/29/20 0737 02/29/20 0804  BP: 114/70 (!) 122/94  100/71  Pulse: 80 81  79  Resp: 15 15  15   Temp: 98.2 F (36.8 C) 98.2 F (36.8 C)  98.6 F (37 C)  TempSrc: Axillary Axillary  Axillary  SpO2: 98% 100% 100% 100%  Weight:      Height:        Wt Readings from Last 3 Encounters:  02/23/20 90.3 kg  01/26/20 90.7 kg  01/16/20 93.9 kg     Intake/Output Summary (Last 24 hours) at 02/29/2020 0914 Last data filed at 02/29/2020 0435 Gross per 24 hour  Intake 1167.91 ml  Output 700 ml  Net 467.91 ml     Physical Exam  Slightly drowsy today, moving all 4 extremities to painful stimuli, denies any headache, [place].AT,PERRAL Supple Neck,No JVD, No cervical lymphadenopathy appriciated.  Symmetrical Chest wall movement, Good air movement bilaterally, CTAB RRR,No Gallops, Rubs or new Murmurs, No Parasternal Heave +ve B.Sounds, Abd Soft, No tenderness, No organomegaly appriciated, No rebound - guarding or rigidity.   lower extremity wounds as below.          Data Review:    CBC Recent Labs  Lab 02/23/20 0952 02/24/20 0355 02/25/20 0254 02/26/20 0343 02/27/20 0500 02/28/20 0500 02/29/20 0455  WBC 11.4*   < > 7.3 8.0 7.7 7.3 7.7  HGB 12.4   < > 9.7* 10.5* 10.6* 8.5* 9.9*  HCT 40.4   < > 32.0* 34.7* 35.0* 28.2* 32.2*  PLT 419*   < > 322 357 371 319 378  MCV 82.1   < > 82.7 83.2 82.2 82.9 81.5  MCH 25.2*   < > 25.1* 25.2* 24.9* 25.0* 25.1*  MCHC 30.7   < > 30.3 30.3 30.3 30.1 30.7  RDW 18.8*   < > 18.7* 18.6* 18.7* 18.8* 18.8*  LYMPHSABS 1.4  --   --  1.4 1.6 1.2 0.9  MONOABS  0.5  --   --  0.7 0.8 0.9 0.9  EOSABS 0.1  --   --  0.1 0.1 0.1 0.1  BASOSABS 0.0  --   --  0.0 0.0 0.0 0.0   < > =  values in this interval not displayed.    Recent Labs  Lab 02/23/20 0952 02/23/20 0952 02/23/20 1149 02/23/20 1152 02/23/20 1432 02/23/20 1453 02/23/20 1934 02/24/20 0355 02/24/20 0355 02/24/20 1901 02/25/20 0254 02/26/20 0148 02/26/20 0343 02/26/20 0344 02/27/20 0500 02/28/20 0500 02/29/20 0455  NA 133*   < >  --   --   --   --   --  136   < >  --  134*  --  134*  --  133* 134* 134*  K 3.7   < >  --   --   --   --   --  3.2*   < >  --  4.1  --  5.4*  --  4.1 2.9* 4.7  CL 97*   < >  --   --   --   --   --  104   < >  --  104  --  103  --  101 122* 104  CO2 26   < >  --   --   --   --   --  24   < >  --  21*  --  22  --  22 18* 23  GLUCOSE 439*   < >  --   --   --   --   --  117*   < >  --  136*  --  120*  --  164* 121* 80  BUN 16   < >  --   --   --   --   --  14   < >  --  22*  --  31*  --  39* 33* 39*  CREATININE 1.17*   < >  --   --   --   --   --  1.38*   < >  --  1.80*  --  2.52*  --  2.88* 2.38* 2.73*  CALCIUM 8.3*   < >  --   --   --   --   --  7.4*   < >  --  7.6*  --  7.8*  --  7.7* 5.8* 7.6*  AST 18  --   --   --   --   --   --   --   --   --   --   --  66*  --  920* 829* 758*  ALT 18  --   --   --   --   --   --   --   --   --   --   --  24  --  181* 235* 341*  ALKPHOS 158*  --   --   --   --   --   --   --   --   --   --   --  137*  --  184* 181* 290*  BILITOT 0.6  --   --   --   --   --   --   --   --   --   --   --  0.7  --  0.4 0.6 0.6  ALBUMIN 2.6*  --   --   --   --   --   --   --   --   --   --   --  1.6*  --  1.6* 1.1* 1.4*  MG  --   --   --   --   --   --   --   --   --   --   --  1.5* 1.5*  --  2.0 1.4* 2.3  PROCALCITON  --   --  <0.10  --   --   --   --  0.67  --   --  0.77  --   --   --   --   --   --   LATICACIDVEN 2.6*  --   --    < > 3.6*  --  >11.0*  --   --  2.1* 0.9  --   --  1.5  --   --   --   INR 1.2  --   --   --   --   --   --   1.4*  --   --   --   --  1.7*  --   --  3.2* 2.1*  HGBA1C  --   --   --   --   --  15.1*  --   --   --   --   --   --   --   --   --   --   --   BNP 868.5*  --   --   --   --   --   --   --   --   --   --   --  658.8*  --  699.1* 510.0* 635.9*   < > = values in this interval not displayed.    ------------------------------------------------------------------------------------------------------------------ No results for input(s): CHOL, HDL, LDLCALC, TRIG, CHOLHDL, LDLDIRECT in the last 72 hours.  Lab Results  Component Value Date   HGBA1C 15.1 (H) 02/23/2020   ------------------------------------------------------------------------------------------------------------------ No results for input(s): TSH, T4TOTAL, T3FREE, THYROIDAB in the last 72 hours.  Invalid input(s): FREET3  Cardiac Enzymes No results for input(s): CKMB, TROPONINI, MYOGLOBIN in the last 168 hours.  Invalid input(s): CK ------------------------------------------------------------------------------------------------------------------    Component Value Date/Time   BNP 635.9 (H) 02/29/2020 0455    Micro Results Recent Results (from the past 240 hour(s))  Blood Culture (routine x 2)     Status: Abnormal   Collection Time: 02/23/20  9:52 AM   Specimen: BLOOD  Result Value Ref Range Status   Specimen Description   Final    BLOOD RIGHT ANTECUBITAL Performed at [place] [address] [place], [place] [address]    Special Requests   Final    BOTTLES DRAWN AEROBIC AND ANAEROBIC Blood Culture adequate volume Performed at [place] [address] [place], [place] [address]    Culture  Setup Time   Final    GRAM NEGATIVE RODS IN BOTH AEROBIC AND ANAEROBIC BOTTLES CRITICAL RESULT CALLED TO, READ BACK BY AND VERIFIED WITH: Irwin Brakeman [id number] 02/24/2020 Mena Goes Performed at [facility], [place] [address] [place], [place] [address]    Culture SERRATIA  MARCESCENS (A)  Final   Report Status 02/26/2020 FINAL  Final   Organism ID, Bacteria SERRATIA MARCESCENS  Final      Susceptibility   Serratia marcescens - MIC*    CEFAZOLIN >=64 RESISTANT Resistant     CEFEPIME <=0.12 SENSITIVE Sensitive     CEFTAZIDIME <=1 SENSITIVE Sensitive     CEFTRIAXONE <=0.25 SENSITIVE Sensitive     CIPROFLOXACIN <=0.25 SENSITIVE Sensitive     GENTAMICIN <=1 SENSITIVE Sensitive     TRIMETH/SULFA <=20 SENSITIVE Sensitive     * SERRATIA MARCESCENS  Blood Culture ID Panel (Reflexed)     Status: Abnormal   Collection Time:  02/23/20  9:52 AM  Result Value Ref Range Status   Enterococcus faecalis NOT DETECTED NOT DETECTED Final   Enterococcus Faecium NOT DETECTED NOT DETECTED Final   Listeria monocytogenes NOT DETECTED NOT DETECTED Final   Staphylococcus species NOT DETECTED NOT DETECTED Final   Staphylococcus aureus (BCID) NOT DETECTED NOT DETECTED Final   Staphylococcus epidermidis NOT DETECTED NOT DETECTED Final   Staphylococcus lugdunensis NOT DETECTED NOT DETECTED Final   Streptococcus species NOT DETECTED NOT DETECTED Final   Streptococcus agalactiae NOT DETECTED NOT DETECTED Final   Streptococcus pneumoniae NOT DETECTED NOT DETECTED Final   Streptococcus pyogenes NOT DETECTED NOT DETECTED Final   A.calcoaceticus-baumannii NOT DETECTED NOT DETECTED Final   Bacteroides fragilis NOT DETECTED NOT DETECTED Final   Enterobacterales DETECTED (A) NOT DETECTED Final    Comment: Enterobacterales represent a large order of gram negative bacteria, not a single organism. CRITICAL RESULT CALLED TO, READ BACK BY AND VERIFIED WITH: M. LILLISTON,PHARMD [id number] 02/24/2020 T. TYSOR    Enterobacter cloacae complex NOT DETECTED NOT DETECTED Final   Escherichia coli NOT DETECTED NOT DETECTED Final   Klebsiella aerogenes NOT DETECTED NOT DETECTED Final   Klebsiella oxytoca NOT DETECTED NOT DETECTED Final   Klebsiella pneumoniae NOT DETECTED NOT DETECTED Final   Proteus species  NOT DETECTED NOT DETECTED Final   Salmonella species NOT DETECTED NOT DETECTED Final   Serratia marcescens DETECTED (A) NOT DETECTED Final    Comment: CRITICAL RESULT CALLED TO, READ BACK BY AND VERIFIED WITH: M. LILLISTON,PHARMD 0257 02/24/2020 T. TYSOR    Haemophilus influenzae NOT DETECTED NOT DETECTED Final   Neisseria meningitidis NOT DETECTED NOT DETECTED Final   Pseudomonas aeruginosa NOT DETECTED NOT DETECTED Final   Stenotrophomonas maltophilia NOT DETECTED NOT DETECTED Final   Candida albicans NOT DETECTED NOT DETECTED Final   Candida auris NOT DETECTED NOT DETECTED Final   Candida glabrata NOT DETECTED NOT DETECTED Final   Candida krusei NOT DETECTED NOT DETECTED Final   Candida parapsilosis NOT DETECTED NOT DETECTED Final   Candida tropicalis NOT DETECTED NOT DETECTED Final   Cryptococcus neoformans/gattii NOT DETECTED NOT DETECTED Final   CTX-M ESBL NOT DETECTED NOT DETECTED Final   Carbapenem resistance IMP NOT DETECTED NOT DETECTED Final   Carbapenem resistance KPC NOT DETECTED NOT DETECTED Final   Carbapenem resistance NDM NOT DETECTED NOT DETECTED Final   Carbapenem resist OXA 48 LIKE NOT DETECTED NOT DETECTED Final   Carbapenem resistance VIM NOT DETECTED NOT DETECTED Final    Comment: Performed at [facility], [place]. [address] [place], [place] [address]  Respiratory Panel by RT PCR (Flu A&B, Covid) - Nasopharyngeal Swab     Status: None   Collection Time: 02/23/20  9:55 AM   Specimen: Nasopharyngeal Swab  Result Value Ref Range Status   SARS Coronavirus 2 by RT PCR NEGATIVE NEGATIVE Final    Comment: (NOTE) SARS-CoV-2 target nucleic acids are NOT DETECTED.  The SARS-CoV-2 RNA is generally detectable in upper respiratoy specimens during the acute phase of infection. The lowest concentration of SARS-CoV-2 viral copies this assay can detect is 131 copies/mL. A negative result does not preclude SARS-Cov-2 infection and should not be used as the sole basis for  treatment or other patient management decisions. A negative result may occur with  improper specimen collection/handling, submission of specimen other than nasopharyngeal swab, presence of viral mutation(s) within the areas targeted by this assay, and inadequate number of viral copies (<131 copies/mL). A negative result must be combined with  clinical observations, patient history, and epidemiological information. The expected result is Negative.  Fact Sheet for Patients:  PinkCheek.be  Fact Sheet for Healthcare Providers:  GravelBags.it  This test is no t yet approved or cleared by the [place] FDA and  has been authorized for detection and/or diagnosis of SARS-CoV-2 by FDA under an Emergency Use Authorization (EUA). This EUA will remain  in effect (meaning this test can be used) for the duration of the COVID-19 declaration under Section 564(b)(1) of the Act, 21 U.S.C. section 360bbb-3(b)(1), unless the authorization is terminated or revoked sooner.     Influenza A by PCR NEGATIVE NEGATIVE Final   Influenza B by PCR NEGATIVE NEGATIVE Final    Comment: (NOTE) The Xpert Xpress SARS-CoV-2/FLU/RSV assay is intended as an aid in  the diagnosis of influenza from Nasopharyngeal swab specimens and  should not be used as a sole basis for treatment. Nasal washings and  aspirates are unacceptable for Xpert Xpress SARS-CoV-2/FLU/RSV  testing.  Fact Sheet for Patients: PinkCheek.be  Fact Sheet for Healthcare Providers: GravelBags.it  This test is not yet approved or cleared by the [place] FDA and  has been authorized for detection and/or diagnosis of SARS-CoV-2 by  FDA under an Emergency Use Authorization (EUA). This EUA will remain  in effect (meaning this test can be used) for the duration of the  Covid-19 declaration under Section 564(b)(1) of the Act, 21   U.S.C. section 360bbb-3(b)(1), unless the authorization is  terminated or revoked. Performed at [facility], [place] [address], [place], [place] [address]   Blood Culture (routine x 2)     Status: None   Collection Time: 02/23/20  9:57 AM   Specimen: BLOOD RIGHT FOREARM  Result Value Ref Range Status   Specimen Description   Final    BLOOD RIGHT FOREARM Performed at [facility], [place] [address] [place], [place] [address]    Special Requests   Final    BOTTLES DRAWN AEROBIC AND ANAEROBIC Blood Culture results may not be optimal due to an inadequate volume of blood received in culture bottles Performed at [facility] [address], [place], [place] [address]    Culture   Final    NO GROWTH 5 DAYS Performed at [facility], [place] [address] [place], [place] [address]    Report Status 02/28/2020 FINAL  Final  Urine culture     Status: Abnormal   Collection Time: 02/23/20 12:09 PM   Specimen: In/Out Cath Urine  Result Value Ref Range Status   Specimen Description   Final    IN/OUT CATH URINE Performed at [place] [address], [place], [place] [address]    Special Requests   Final    NONE Performed at [facility], [place] [address] [place], [place] [address]    Culture (A)  Final    <10,000 COLONIES/mL INSIGNIFICANT GROWTH Performed at [place] [place] [address] [place], [place] [address]    Report Status 02/24/2020 FINAL  Final  MRSA PCR Screening     Status: None   Collection Time: 02/25/20  7:26 AM   Specimen: Nasal Mucosa; Nasopharyngeal  Result Value Ref Range Status   MRSA by PCR NEGATIVE NEGATIVE Final    Comment:        The GeneXpert MRSA Assay (FDA approved for NASAL specimens only), is one component of a comprehensive MRSA colonization surveillance program. It is not intended  to diagnose MRSA infection nor to guide or monitor treatment for MRSA  infections. Performed at [facility], [place] [address], [place], [place] [address]     Radiology Reports CT ANGIO Delrae Sawyers & OR WO CONTRAST  Result Date: 02/24/2020 CLINICAL DATA:  Chest pain.  Peripheral vascular disease. EXAM: CT ANGIOGRAPHY OF ABDOMINAL AORTA WITH ILIOFEMORAL RUNOFF CT ANGIOGRAPHY OF ABDOMINAL AORTA WITH ILIOFEMORAL RUNOFF TECHNIQUE: Multidetector CT imaging of the abdomen, pelvis and lower extremities was performed using the standard protocol during bolus administration of intravenous contrast. Multiplanar CT image reconstructions and MIPs were obtained to evaluate the vascular anatomy. Multidetector CT imaging of the abdomen, pelvis and lower extremities was performed using the standard protocol during bolus administration of intravenous contrast. Multiplanar CT image reconstructions and MIPs were obtained to evaluate the vascular anatomy. CONTRAST:  190mL OMNIPAQUE IOHEXOL 350 MG/ML SOLN COMPARISON:  CT dated October 09, 2019. CT PE study dated 02/25/2019 FINDINGS: CHEST Cardiovascular: There is no evidence for large centrally located pulmonary embolism. Detection of smaller pulmonary emboli is limited by technique. There is no evidence for a thoracic aortic dissection or aneurysm. The heart size is enlarged. There is no significant pericardial effusion. There are mild coronary artery calcifications. Mediastinum/Nodes: --there are enlarged mediastinal lymph nodes. For example there is a pretracheal lymph node measuring approximately 1.8 x 2.9 cm (axial series 7, image 40). This is relatively similar to prior study. --mild hilar adenopathy is noted. -- No axillary lymphadenopathy. --there is significant right supraclavicular adenopathy which has progressed since the prior study. The dominant lymph node measures 1.2 by 3 point 4 cm (axial series 7, image 15). -- Normal thyroid gland where visualized. -  Unremarkable esophagus. Lungs/Pleura: There is a moderate to large right-sided  pleural effusion. There is a small left-sided pleural effusion. There is bibasilar atelectasis. There is no pneumothorax. Musculoskeletal: No chest wall abnormality. No bony spinal canal stenosis. Review of the MIP images confirms the above findings. VASCULAR Aorta: There are atherosclerotic changes of the abdominal aorta without evidence for an aneurysm. Celiac: Patent without evidence of aneurysm, dissection, vasculitis or significant stenosis. SMA: Patent without evidence of aneurysm, dissection, vasculitis or significant stenosis. Renals: Both renal arteries are patent without evidence of aneurysm, dissection, vasculitis, fibromuscular dysplasia or significant stenosis. IMA: Patent without evidence of aneurysm, dissection, vasculitis or significant stenosis. RIGHT Lower Extremity Inflow: Common, internal and external iliac arteries are patent without evidence of aneurysm, dissection, vasculitis or significant stenosis. Outflow: The right common femoral artery is patent. The right SFA is occluded proximally. There is near immediate reconstitution. There are tandem high-grade areas of stenosis throughout the right SFA. There is new complete occlusion of the mid to distal right SFA (axial series 7, image 247). Evaluation of the right popliteal artery is limited, however there appears to be high-grade stenosis throughout the vessel. Runoff: There is a high-grade stenosis of the proximal right anterior tibial artery. There appears to be a single vessel runoff to the right lower extremity via the anterior tibial artery. LEFT Lower Extremity Inflow: Common, internal and external iliac arteries are patent without evidence of aneurysm, dissection, vasculitis or significant stenosis. Outflow: The left CFA is widely patent. The proximal left SFA is entirely occluded. There is a new left femoral to below knee popliteal artery bypass graft. The graft is widely patent throughout its course. The profunda femoris artery is  patent. Runoff: Heavy calcifications limit evaluation of the tibial vasculature. However there appears to be a 2 vessel  runoff via the anterior tibial and posterior tibial arteries. The peroneal artery is occluded. Veins: The veins are not well evaluated on this study. Review of the MIP images confirms the above findings. NON-VASCULAR Hepatobiliary: There is hepatomegaly with likely underlying hepatic steatosis. There appears to be gallbladder wall thickening with pericholecystic free fluid.There is no biliary ductal dilation. Pancreas: Normal contours without ductal dilatation. No peripancreatic fluid collection. Spleen: Unremarkable. Adrenals/Urinary Tract: --Adrenal glands: Unremarkable. --Right kidney/ureter: No hydronephrosis or radiopaque kidney stones. --Left kidney/ureter: No hydronephrosis or radiopaque kidney stones. --Urinary bladder: Unremarkable. Stomach/Bowel: --Stomach/Duodenum: No hiatal hernia or other gastric abnormality. Normal duodenal course and caliber. --Small bowel: Unremarkable. --Colon: Unremarkable. --Appendix: Normal. Lymphatic: --No retroperitoneal lymphadenopathy. --No mesenteric lymphadenopathy. --there is mild bilateral inguinal adenopathy, left worse than right. Reproductive: Unremarkable Other: No ascites or free air. The abdominal wall is normal. Musculoskeletal. There are postsurgical changes of the left inguinal region. There is bilateral nonspecific lower extremity edema, left worse than right. There are small bilateral suprapatellar joint effusions. IMPRESSION: 1. Patent left lower extremity femoral to below knee popliteal artery bypass graft. Evaluation of the left tibial vasculature is limited by calcifications, however there appears to be a 2 vessel runoff to the left ankle via the anterior tibial and posterior tibial arteries. 2. Progressive peripheral vascular disease involving the right lower extremity. There is now complete occlusion of the mid to distal right  superficial femoral artery, new since prior study. There are now appears to be a single vessel runoff to the right foot via the anterior tibial artery. In June, there was a 3 vessel runoff to the right foot. 3. Nonspecific, asymmetric bilateral lower extremity edema (left worse than right). This is of unknown clinical significance. This study cannot adequately exclude a DVT. If there is clinical suspicion for a DVT, follow-up with ultrasound is recommended. 4. No evidence for an acute pulmonary embolism. No evidence for thoracic aortic dissection or aneurysm. 5. Cardiomegaly with small to moderate-sized bilateral pleural effusions, right worse than left. 6. Persistent mediastinal and hilar adenopathy with worsening right supraclavicular adenopathy. Findings may be reactive or due to a lymphoproliferative disorder or metastatic disease. Follow-up is recommended. Of note, the right supraclavicular lymph node is amenable to percutaneous ultrasound-guided biopsy if clinically indicated. 7. Hepatomegaly with likely underlying hepatic steatosis. 8.  Aortic Atherosclerosis (ICD10-I70.0). Electronically Signed   By: Constance Holster M.D.   On: 02/24/2020 18:13   US RENAL  Result Date: 02/26/2020 CLINICAL DATA:  Acute kidney injury. EXAM: RENAL / URINARY TRACT ULTRASOUND COMPLETE COMPARISON:  CT angiogram of the abdomen and pelvis from 02/24/2020. FINDINGS: Right Kidney: Renal measurements: 11.2 x 4.8 x 6.0 cm = volume: 169 mL. Mildly echogenic renal parenchyma, normal thickness. No hydronephrosis. No renal mass. Left Kidney: Renal measurements: 12.2 x 5.4 x 5.6 cm = volume: 184 mL. Mildly echogenic renal parenchyma, normal thickness. No hydronephrosis. No renal mass. Bladder: Appears normal for degree of bladder distention. Other: Incidentally noted diffuse gallbladder wall thickening. IMPRESSION: 1. No hydronephrosis. 2. Mildly echogenic normal size kidneys, compatible with reported history of nonspecific acute  renal parenchymal disease. 3. Normal bladder. 4. Incidentally noted nonspecific diffuse gallbladder wall thickening. If there is clinical concern for acute cholecystitis, dedicated right upper quadrant abdominal sonogram could be obtained for further evaluation. Electronically Signed   By: Ilona Sorrel M.D.   On: 02/26/2020 10:20   DG Chest Port 1 View  Result Date: 02/27/2020 CLINICAL DATA:  Short of breath EXAM: PORTABLE CHEST 1 VIEW  COMPARISON:  02/26/2020 FINDINGS: Cardiac enlargement and vascular congestion. Mild improvement in bilateral airspace disease consistent with edema. No significant effusion. Left arm PICC tip in the SVC IMPRESSION: Congestive heart failure with mild interval improvement in edema. Electronically Signed   By: Franchot Gallo M.D.   On: 02/27/2020 08:02   DG Chest Port 1 View  Result Date: 02/26/2020 CLINICAL DATA:  Shortness of breath; history breast cancer, CHF, chronic kidney disease, COPD, diabetes mellitus, hypertension EXAM: PORTABLE CHEST 1 VIEW COMPARISON:  Portable exam 0729 hours compared to 02/25/2020 FINDINGS: LEFT arm PICC line tip projects over SVC. Enlargement of cardiac silhouette. Mediastinal contours normal. BILATERAL pulmonary infiltrates identified, basilar predominance, favor multifocal pneumonia over pulmonary edema. Subsegmental atelectasis lower LEFT lung. Question minimal LEFT pleural effusion. No pneumothorax. Endplate spur formation thoracic spine. IMPRESSION: Persistent BILATERAL pulmonary infiltrates, predominantly basilar, favoring multifocal pneumonia. Electronically Signed   By: Lavonia Dana M.D.   On: 02/26/2020 07:56   DG Chest Port 1 View  Result Date: 02/25/2020 CLINICAL DATA:  Shortness of breath, on BiPAP EXAM: PORTABLE CHEST 1 VIEW COMPARISON:  Portable exam at 1039 hrs compared to 02/23/2020 FINDINGS: Enlargement of cardiac silhouette with pulmonary vascular congestion. Diffuse infiltrates throughout both lungs, greater at the lower  lungs bilaterally, question multifocal pneumonia pulmonary edema considered less likely. No pleural effusion or pneumothorax. Osseous structures unremarkable IMPRESSION: Enlargement of cardiac silhouette with slight pulmonary vascular congestion. Diffuse BILATERAL pulmonary infiltrates greater at lower lungs bilaterally, favor multifocal pneumonia or less likely pulmonary edema. Electronically Signed   By: Lavonia Dana M.D.   On: 02/25/2020 10:49   DG Chest Port 1 View  Result Date: 02/23/2020 CLINICAL DATA:  Concern for sepsis EXAM: PORTABLE CHEST 1 VIEW COMPARISON:  10/09/2019 FINDINGS: Increased bibasilar airspace opacities worse on the left obscuring the left hemidiaphragm concerning for bibasilar pneumonia. Stable cardiomegaly. No CHF pattern or large effusion. No pneumothorax. Aorta atherosclerotic. IMPRESSION: Bibasilar airspace process, worse on the left concerning for pneumonia. Electronically Signed   By: Jerilynn Mages.  Shick M.D.   On: 02/23/2020 10:24   DG Foot 2 Views Left  Result Date: 02/23/2020 CLINICAL DATA:  Chronic wound EXAM: LEFT FOOT - 2 VIEW COMPARISON:  Sep 21, 2019 FINDINGS: Frontal and lateral views were obtained. There is soft tissue swelling. There is suggestion of soft tissue air medial to the distal aspect of the first metatarsal. There is no erosive change or osteomyelitis. No fracture or dislocation. Joint spaces overall appear unremarkable. There are prominent posterior and inferior calcaneal spurs. IMPRESSION: Soft tissue swelling with soft tissue air medial to the mid to distal first metatarsal. No bony destruction or erosion. No appreciable joint space narrowing. There are calcaneal spurs. No fracture or dislocation evident. Electronically Signed   By: Lowella Grip III M.D.   On: 02/23/2020 10:22   CT ANGIO CHEST AORTA W/CM &/OR WO/CM  Result Date: 02/24/2020 CLINICAL DATA:  Chest pain.  Peripheral vascular disease. EXAM: CT ANGIOGRAPHY OF ABDOMINAL AORTA WITH ILIOFEMORAL  RUNOFF CT ANGIOGRAPHY OF ABDOMINAL AORTA WITH ILIOFEMORAL RUNOFF TECHNIQUE: Multidetector CT imaging of the abdomen, pelvis and lower extremities was performed using the standard protocol during bolus administration of intravenous contrast. Multiplanar CT image reconstructions and MIPs were obtained to evaluate the vascular anatomy. Multidetector CT imaging of the abdomen, pelvis and lower extremities was performed using the standard protocol during bolus administration of intravenous contrast. Multiplanar CT image reconstructions and MIPs were obtained to evaluate the vascular anatomy. CONTRAST:  123mL OMNIPAQUE IOHEXOL 350  MG/ML SOLN COMPARISON:  CT dated October 09, 2019. CT PE study dated 02/25/2019 FINDINGS: CHEST Cardiovascular: There is no evidence for large centrally located pulmonary embolism. Detection of smaller pulmonary emboli is limited by technique. There is no evidence for a thoracic aortic dissection or aneurysm. The heart size is enlarged. There is no significant pericardial effusion. There are mild coronary artery calcifications. Mediastinum/Nodes: --there are enlarged mediastinal lymph nodes. For example there is a pretracheal lymph node measuring approximately 1.8 x 2.9 cm (axial series 7, image 40). This is relatively similar to prior study. --mild hilar adenopathy is noted. -- No axillary lymphadenopathy. --there is significant right supraclavicular adenopathy which has progressed since the prior study. The dominant lymph node measures 1.2 by 3 point 4 cm (axial series 7, image 15). -- Normal thyroid gland where visualized. -  Unremarkable esophagus. Lungs/Pleura: There is a moderate to large right-sided pleural effusion. There is a small left-sided pleural effusion. There is bibasilar atelectasis. There is no pneumothorax. Musculoskeletal: No chest wall abnormality. No bony spinal canal stenosis. Review of the MIP images confirms the above findings. VASCULAR Aorta: There are atherosclerotic  changes of the abdominal aorta without evidence for an aneurysm. Celiac: Patent without evidence of aneurysm, dissection, vasculitis or significant stenosis. SMA: Patent without evidence of aneurysm, dissection, vasculitis or significant stenosis. Renals: Both renal arteries are patent without evidence of aneurysm, dissection, vasculitis, fibromuscular dysplasia or significant stenosis. IMA: Patent without evidence of aneurysm, dissection, vasculitis or significant stenosis. RIGHT Lower Extremity Inflow: Common, internal and external iliac arteries are patent without evidence of aneurysm, dissection, vasculitis or significant stenosis. Outflow: The right common femoral artery is patent. The right SFA is occluded proximally. There is near immediate reconstitution. There are tandem high-grade areas of stenosis throughout the right SFA. There is new complete occlusion of the mid to distal right SFA (axial series 7, image 247). Evaluation of the right popliteal artery is limited, however there appears to be high-grade stenosis throughout the vessel. Runoff: There is a high-grade stenosis of the proximal right anterior tibial artery. There appears to be a single vessel runoff to the right lower extremity via the anterior tibial artery. LEFT Lower Extremity Inflow: Common, internal and external iliac arteries are patent without evidence of aneurysm, dissection, vasculitis or significant stenosis. Outflow: The left CFA is widely patent. The proximal left SFA is entirely occluded. There is a new left femoral to below knee popliteal artery bypass graft. The graft is widely patent throughout its course. The profunda femoris artery is patent. Runoff: Heavy calcifications limit evaluation of the tibial vasculature. However there appears to be a 2 vessel runoff via the anterior tibial and posterior tibial arteries. The peroneal artery is occluded. Veins: The veins are not well evaluated on this study. Review of the MIP images  confirms the above findings. NON-VASCULAR Hepatobiliary: There is hepatomegaly with likely underlying hepatic steatosis. There appears to be gallbladder wall thickening with pericholecystic free fluid.There is no biliary ductal dilation. Pancreas: Normal contours without ductal dilatation. No peripancreatic fluid collection. Spleen: Unremarkable. Adrenals/Urinary Tract: --Adrenal glands: Unremarkable. --Right kidney/ureter: No hydronephrosis or radiopaque kidney stones. --Left kidney/ureter: No hydronephrosis or radiopaque kidney stones. --Urinary bladder: Unremarkable. Stomach/Bowel: --Stomach/Duodenum: No hiatal hernia or other gastric abnormality. Normal duodenal course and caliber. --Small bowel: Unremarkable. --Colon: Unremarkable. --Appendix: Normal. Lymphatic: --No retroperitoneal lymphadenopathy. --No mesenteric lymphadenopathy. --there is mild bilateral inguinal adenopathy, left worse than right. Reproductive: Unremarkable Other: No ascites or free air. The abdominal wall is normal. Musculoskeletal. There  are postsurgical changes of the left inguinal region. There is bilateral nonspecific lower extremity edema, left worse than right. There are small bilateral suprapatellar joint effusions. IMPRESSION: 1. Patent left lower extremity femoral to below knee popliteal artery bypass graft. Evaluation of the left tibial vasculature is limited by calcifications, however there appears to be a 2 vessel runoff to the left ankle via the anterior tibial and posterior tibial arteries. 2. Progressive peripheral vascular disease involving the right lower extremity. There is now complete occlusion of the mid to distal right superficial femoral artery, new since prior study. There are now appears to be a single vessel runoff to the right foot via the anterior tibial artery. In June, there was a 3 vessel runoff to the right foot. 3. Nonspecific, asymmetric bilateral lower extremity edema (left worse than right). This is of  unknown clinical significance. This study cannot adequately exclude a DVT. If there is clinical suspicion for a DVT, follow-up with ultrasound is recommended. 4. No evidence for an acute pulmonary embolism. No evidence for thoracic aortic dissection or aneurysm. 5. Cardiomegaly with small to moderate-sized bilateral pleural effusions, right worse than left. 6. Persistent mediastinal and hilar adenopathy with worsening right supraclavicular adenopathy. Findings may be reactive or due to a lymphoproliferative disorder or metastatic disease. Follow-up is recommended. Of note, the right supraclavicular lymph node is amenable to percutaneous ultrasound-guided biopsy if clinically indicated. 7. Hepatomegaly with likely underlying hepatic steatosis. 8.  Aortic Atherosclerosis (ICD10-I70.0). Electronically Signed   By: Constance Holster M.D.   On: 02/24/2020 18:13   VAS [place] LOWER EXTREMITY VENOUS (DVT)  Result Date: 02/25/2020  Lower Venous DVTStudy Indications: Edema.  Risk Factors: Surgery 01-26-2020 LT balloon angioplasty, 10-14-2019 LT fem-pop bypass graft using non-reversed great saphenous vein. Limitations: Body habitus, poor ultrasound/tissue interface and patient intolerant to probe pressure. Comparison Study: No prior studies. Performing Technologist: Darlin Coco  Examination Guidelines: A complete evaluation includes B-mode imaging, spectral Doppler, color Doppler, and power Doppler as needed of all accessible portions of each vessel. Bilateral testing is considered an integral part of a complete examination. Limited examinations for reoccurring indications may be performed as noted. The reflux portion of the exam is performed with the patient in reverse Trendelenburg.  +-----+---------------+---------+-----------+----------+--------------+ RIGHTCompressibilityPhasicitySpontaneityPropertiesThrombus Aging +-----+---------------+---------+-----------+----------+--------------+ CFV  Full           Yes       Yes                                 +-----+---------------+---------+-----------+----------+--------------+   +---------+---------------+---------+-----------+----------+---------------+ LEFT     CompressibilityPhasicitySpontaneityPropertiesThrombus Aging  +---------+---------------+---------+-----------+----------+---------------+ CFV      Full           Yes      Yes                                  +---------+---------------+---------+-----------+----------+---------------+ SFJ      Full                                                         +---------+---------------+---------+-----------+----------+---------------+ FV Prox  Full                                                         +---------+---------------+---------+-----------+----------+---------------+  FV Mid                  Yes      Yes                  Patent by color +---------+---------------+---------+-----------+----------+---------------+ FV Distal               Yes      Yes                  Patent by color +---------+---------------+---------+-----------+----------+---------------+ PFV      Full                                                         +---------+---------------+---------+-----------+----------+---------------+ POP      Full           Yes      Yes                                  +---------+---------------+---------+-----------+----------+---------------+ PTV      Full                                                         +---------+---------------+---------+-----------+----------+---------------+ PERO                    Yes      Yes                  Patent by color +---------+---------------+---------+-----------+----------+---------------+     Summary: RIGHT: - No evidence of common femoral vein obstruction.  LEFT: - There is no evidence of deep vein thrombosis in the lower extremity. However, portions of this examination were limited- see  technologist comments above.  - No cystic structure found in the popliteal fossa.  *See table(s) above for measurements and observations. Electronically signed by Harold Barban MD on 02/25/2020 at 8:54:52 PM.    Final    [place] EKG SITE RITE  Result Date: 02/25/2020 If Site Rite image not attached, placement could not be confirmed due to current cardiac rhythm.  US Abdomen Limited RUQ (LIVER/GB)  Result Date: 02/28/2020 CLINICAL DATA:  Transaminitis, EXAM: ULTRASOUND ABDOMEN LIMITED RIGHT UPPER QUADRANT COMPARISON:  None. FINDINGS: Gallbladder: The gallbladder is mildly distended and there is pericholecystic fluid identified within the a gallbladder fossa. A small amount of layering sludge is seen within the gallbladder neck. There is no associated gallbladder wall thickening. The sonographic Percell Miller sign is reportedly positive. Common bile duct: Diameter: 3 mm Liver: Liver parenchymal echogenicity and echotexture is normal. No focal intrahepatic masses are seen and there is no intrahepatic biliary ductal dilation. Portal vein is patent on color Doppler imaging with normal direction of blood flow towards the liver. Other: No ascites IMPRESSION: Mild distention of the gallbladder, gallbladder sludge, and pericholecystic fluid identified. Reportedly positive sonographic Murphy sign. Together, the findings are suspicious for changes of acute, calculus cholecystitis. Electronically Signed   By: Fidela Salisbury MD   On: 02/28/2020 01:23

## 2020-02-29 NOTE — Progress Notes (Signed)
Patient very somnolent this am and has trouble maintaining alertness. Washed patient's face to promote arousal but patient could not maintain alertness. Vital signs were WNL and oncall provider notified. Holding po meds at this time.  @0615  patient more alert now and is responding to questions appropriately. Will continue to monitor. Vital signs remain WNL.

## 2020-02-29 NOTE — Progress Notes (Signed)
Pt placed on CPAP Bled 3L O2, she is tolerating it well. RN aware.

## 2020-02-29 NOTE — Progress Notes (Signed)
[facility] Progress Note     Subjective: CC:  Very somnolent, arouses to loud voice and sternal rub only briefly. Shakes her head no when I ask if her stomach/abdomen hurt.  Objective: Vital signs in last 24 hours: Temp:  [97.4 F (36.3 C)-98.6 F (37 C)] 98.6 F (37 C) (10/31 0804) Pulse Rate:  [75-81] 79 (10/31 0804) Resp:  [15-18] 15 (10/31 0804) BP: (100-122)/(65-94) 100/71 (10/31 0804) SpO2:  [98 %-100 %] 100 % (10/31 0804) FiO2 (%):  [28 %] 28 % (10/31 0737)    Intake/Output from previous day: 10/30 0701 - 10/31 0700 In: 1287.9 [P.O.:120; I.V.:1167.9] Out: 700 [Urine:700] Intake/Output this shift: No intake/output data recorded.  PE: Gen: somnolent female, resting comfortably Card:  Regular rate and rhythm, pedal pulses 2+ BL Pulm:  Normal effort, clear to auscultation bilaterally Abd: Soft, obese, non-tender, mild distention Skin: warm and dry, no rashes  Psych: unable to assess due to mental status   Lab Results:  Recent Labs    02/28/20 0500 02/29/20 0455  WBC 7.3 7.7  HGB 8.5* 9.9*  HCT 28.2* 32.2*  PLT 319 378   BMET Recent Labs    02/28/20 0500 02/29/20 0455  NA 134* 134*  K 2.9* 4.7  CL 122* 104  CO2 18* 23  GLUCOSE 121* 80  BUN 33* 39*  CREATININE 2.38* 2.73*  CALCIUM 5.8* 7.6*   PT/INR Recent Labs    02/28/20 0500 02/29/20 0455  LABPROT 31.4* 22.8*  INR 3.2* 2.1*   CMP     Component Value Date/Time   NA 134 (L) 02/29/2020 0455   NA 137 02/09/2020 0825   K 4.7 02/29/2020 0455   CL 104 02/29/2020 0455   CO2 23 02/29/2020 0455   GLUCOSE 80 02/29/2020 0455   BUN 39 (H) 02/29/2020 0455   BUN 14 02/09/2020 0825   CREATININE 2.73 (H) 02/29/2020 0455   CALCIUM 7.6 (L) 02/29/2020 0455   PROT 4.9 (L) 02/29/2020 0455   ALBUMIN 1.4 (L) 02/29/2020 0455   AST 758 (H) 02/29/2020 0455   ALT 341 (H) 02/29/2020 0455   ALKPHOS 290 (H) 02/29/2020 0455   BILITOT 0.6 02/29/2020 0455   GFRNONAA 20 (L) 02/29/2020 0455    GFRAA 65 02/09/2020 0825   Lipase  No results found for: LIPASE     Studies/Results: US Abdomen Limited RUQ (LIVER/GB)  Result Date: 02/28/2020 CLINICAL DATA:  Transaminitis, EXAM: ULTRASOUND ABDOMEN LIMITED RIGHT UPPER QUADRANT COMPARISON:  None. FINDINGS: Gallbladder: The gallbladder is mildly distended and there is pericholecystic fluid identified within the a gallbladder fossa. A small amount of layering sludge is seen within the gallbladder neck. There is no associated gallbladder wall thickening. The sonographic Percell Miller sign is reportedly positive. Common bile duct: Diameter: 3 mm Liver: Liver parenchymal echogenicity and echotexture is normal. No focal intrahepatic masses are seen and there is no intrahepatic biliary ductal dilation. Portal vein is patent on color Doppler imaging with normal direction of blood flow towards the liver. Other: No ascites IMPRESSION: Mild distention of the gallbladder, gallbladder sludge, and pericholecystic fluid identified. Reportedly positive sonographic Murphy sign. Together, the findings are suspicious for changes of acute, calculus cholecystitis. Electronically Signed   By: Fidela Salisbury MD   On: 02/28/2020 01:23    Anti-infectives: Anti-infectives (From admission, onward)   Start     Dose/Rate Route Frequency Ordered Stop   02/28/20 0546  ceFEPIme (MAXIPIME) 2 g in sodium chloride 0.9 % 100 mL IVPB  2 g 200 mL/hr over 30 Minutes Intravenous Every 24 hours 02/27/20 1041     02/25/20 1400  metroNIDAZOLE (FLAGYL) tablet 500 mg        500 mg Oral Every 8 hours 02/25/20 1025     02/24/20 1800  ceFEPIme (MAXIPIME) 2 g in sodium chloride 0.9 % 100 mL IVPB  Status:  Discontinued        2 g 200 mL/hr over 30 Minutes Intravenous Every 12 hours 02/24/20 0732 02/27/20 1041   02/24/20 0600  ceFEPIme (MAXIPIME) 2 g in sodium chloride 0.9 % 100 mL IVPB  Status:  Discontinued        2 g 200 mL/hr over 30 Minutes Intravenous Every 8 hours 02/24/20 0440  02/24/20 0732   02/23/20 1015  vancomycin (VANCOREADY) IVPB 1750 mg/350 mL        1,750 mg 175 mL/hr over 120 Minutes Intravenous  Once 02/23/20 1007 02/23/20 1347   02/23/20 1000  cefTRIAXone (ROCEPHIN) 2 g in sodium chloride 0.9 % 100 mL IVPB  Status:  Discontinued        2 g 200 mL/hr over 30 Minutes Intravenous Every 24 hours 02/23/20 0952 02/24/20 0440   02/23/20 1000  metroNIDAZOLE (FLAGYL) IVPB 500 mg  Status:  Discontinued        500 mg 100 mL/hr over 60 Minutes Intravenous Every 8 hours 02/23/20 0952 02/25/20 1025     Assessment/Plan Chronic systolic and diastolic CHF - echo 08/3746 EF 40-45% CKD II COPD not on home O2  OSA - BiPAP or CPAP at home  DM2 - A1c this admission 15.1, poorly controlled, peripheral neuropathy HLD HTN PAD  L femoropoliteal bypass and laser angioplasty LLE by Dr. Vella Redhead 10/14/19 Chronic non-healing L foot ulcer Serratia bacteremia   Elevated LFT's  - AST 758, ALT 341, Alk Phos 290, bili 0.6 Possible calculous cholecystitis vs. Acute liver injury - 59 y/o F with no known history of gallbladder disease and multiple medical comorbidities as above who developed an acute rise in her LFTs and has vague abdominal pain intermittently associated with poor appetite. RUQ U/S with signs of possible cholecystitis. Her exam is benign and is not consistent with acute cholecystitis. Afebrile, WBC WNL. Will get HIDA scan to rule out cholecystitis. Given above medical conditions and baseline physical deconditioning, if HIDA is positive I would recommend percutaneous cholecystostomy tube placement.  - Acute hepatitis panel negative, INR 2.1 from 3.2 after Vit K 10/30   LOS: 6 days    Obie Dredge, [facility] Please see Amion for pager number during day hours 7:00am-4:30pm

## 2020-02-29 NOTE — Consult Note (Addendum)
Consultation  Referring Provider:  TR[facility]/ Candiss Norse  Primary Care Physician:  Riki Sheer, NP Primary Gastroenterologist:  Dr.Armbruster  Reason for Consultation:  Sepsis/ Elevated LFT's  HPI: Morgan Beasley is a 59 y.o. female, with multiple significant comorbidities, who was admitted on 02/23/2020 with complaints of left foot pain and found to have a nonhealing left foot ulcer.   She was diagnosed with sepsis, felt secondary to bacteremia from left lower extremity cellulitis and ulcer.  Cultures positive for Serratia and she had been on cefepime and metronidazole. Patient with poorly controlled adult onset diabetes mellitus, coronary artery disease, congestive heart failure, peripheral vascular disease/peripheral arterial disease who is status post left femoropopliteal bypass in June 2021.  She had been on Plavix.  Also with underlying chronic kidney disease and COPD.  She has a chronic pain syndrome with chronic narcotic use. Known to Dr. 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  She had been managed for chronic abdominal pain with gabapentin, Elavil and Creon which definitely seemed to help her symptoms. LFTs on admit normal with exception of alk phos of 158 On 02/26/2019 1T bili 0.7/AST 66/ALT 24/alk phos 137. Today T bili 0.6/alk phos 290/AST 758/ALT 341. INR 2.1, BNP 635 acute hepatitis panel negative.  Upper abdominal ultrasound done yesterday showed a distended gallbladder with fluid in the gallbladder fossa, sludge in the gallbladder neck no gallbladder wall thickening CBD 3 mm.  Findings felt suspicious for cholecystitis. She has been seen by surgery who by exam did not feel she had acute cholecystitis.  HIDA scan is pending today.  Patient has been felt to have a metabolic encephalopathy, since admit.,  Worsening lethargy over the past 24 to 36 hours.  CT of the head was done which revealed a subacute right  occipital infarct.  She is being evaluated by neurology today.  Patient is somnolent but arousable currently speech garbled and unable to obtain any history   Past Medical History:  Diagnosis Date  . Alcohol dependence ([place])   . Anemia   . Anxiety   . Breast cancer ([place])   . Chronic combined systolic and diastolic CHF (congestive heart failure) ([place])   . Cigarette nicotine dependence   . CKD (chronic kidney disease), stage III ([place])   . Colon polyps   . COPD (chronic obstructive pulmonary disease) ([place])   . Diabetes mellitus without complication ([place])   . Diabetic neuropathy ([place])   . Elevated lipase   . Gout   . Hyperlipemia   . Hypertension   . Insomnia   . Lymphedema   . Marijuana use   . Mild CAD 2016   a. NSTEMI 2016 in context of cocaine abuse, 50% RCA% at that time.  . OSA treated with BiPAP   . PAD (peripheral artery disease) ([name])    a. s/p L SFA stenting 08/2019. b. left fem-to-below-knee-popliteal bypass 09/2019.  Marland Kitchen Sleep apnea    wears BIPAP  . Ulcer of foot ([place])    right    Past Surgical History:  Procedure Laterality Date  . ABDOMINAL AORTOGRAM W/LOWER EXTREMITY N/A 09/26/2019   Procedure: ABDOMINAL AORTOGRAM W/LOWER EXTREMITY;  Surgeon: Angelia Mould, MD;  Location: [place] [facility];  Service: Cardiovascular;  Laterality: N/A;  . ABDOMINAL AORTOGRAM W/LOWER EXTREMITY Bilateral 12/08/2019   Procedure: ABDOMINAL AORTOGRAM W/LOWER EXTREMITY;  Surgeon: Waynetta Sandy, MD;  Location: [place] [facility];  Service: Cardiovascular;  Laterality: Bilateral;  .  ABDOMINAL AORTOGRAM W/LOWER EXTREMITY Left 01/26/2020   Procedure: ABDOMINAL AORTOGRAM W/LOWER EXTREMITY;  Surgeon: Waynetta Sandy, MD;  Location: [place] [facility];  Service: Cardiovascular;  Laterality: Left;  . ABDOMINAL HYSTERECTOMY    . [facility]  . FEMORAL-POPLITEAL BYPASS GRAFT Left 10/14/2019   Procedure: BYPASS GRAFT LEFT FEMORAL-POPLITEAL ARTERY  USING NONREVERSED SAPHENOUS VEIN;  Surgeon: Waynetta Sandy, MD;  Location: [place];  Service: Vascular;  Laterality: Left;  Marland Kitchen MASTECTOMY Right April 2016  . PERIPHERAL VASCULAR ATHERECTOMY Left 01/26/2020   Procedure: PERIPHERAL VASCULAR ATHERECTOMY;  Surgeon: Waynetta Sandy, MD;  Location: [place] [facility];  Service: Cardiovascular;  Laterality: Left;  PT and AT - Laser  . PERIPHERAL VASCULAR BALLOON ANGIOPLASTY Left 01/26/2020   Procedure: PERIPHERAL VASCULAR BALLOON ANGIOPLASTY;  Surgeon: Waynetta Sandy, MD;  Location: [place] [facility];  Service: Cardiovascular;  Laterality: Left;  [facility]   . PERIPHERAL VASCULAR INTERVENTION Left 09/26/2019   Procedure: PERIPHERAL VASCULAR INTERVENTION;  Surgeon: Angelia Mould, MD;  Location: [place] [facility];  Service: Cardiovascular;  Laterality: Left;  superficial femoral    Prior to Admission medications   Medication Sig Start Date End Date Taking? Authorizing Provider  albuterol (PROAIR HFA) 108 (90 Base) MCG/ACT inhaler Inhale 2 puffs into the lungs every 6 (six) hours as needed for wheezing or shortness of breath.  05/19/14   [provider]  amitriptyline (ELAVIL) 100 MG tablet Take 100 mg by mouth at bedtime.     [provider]  budesonide-formoterol (SYMBICORT) 160-4.5 MCG/ACT inhaler Inhale 2 puffs into the lungs 2 (two) times daily.    [provider]  clopidogrel (PLAVIX) 75 MG tablet Take 1 tablet (75 mg total) by mouth daily. 09/27/19 09/26/20  Lyndee Hensen, DO  dapagliflozin propanediol (FARXIGA) 10 MG TABS tablet Take 1 tablet (10 mg total) by mouth daily before breakfast. 09/26/19   Angelia Mould, MD  ezetimibe (ZETIA) 10 MG tablet Take 1 tablet (10 mg total) by mouth daily. Take with largest meal of the day 12/22/19   Fay Records, MD  furosemide (LASIX) 40 MG tablet Take 40-80 mg by mouth daily.    [provider]  gabapentin (NEURONTIN) 800 MG tablet  Take 800 mg by mouth 3 (three) times daily.    [provider]  glimepiride (AMARYL) 4 MG tablet Take 8 mg by mouth daily with breakfast.     [provider]  insulin glargine (LANTUS) 100 UNIT/ML injection Inject 0.4 mLs (40 Units total) into the skin daily. Patient taking differently: Inject 80 Units into the skin daily.  09/27/19   Brimage, Ronnette Juniper, DO  insulin lispro (HUMALOG) 100 UNIT/ML injection Inject 14 Units into the skin 3 (three) times daily before meals.     [provider]  lipase/protease/amylase (CREON) 36000 UNITS CPEP capsule Take 2 capsule by mouth with each meal and 1 capsule by mouth with each snack Patient taking differently: Take 36,000-72,000 Units by mouth See admin instructions. Take 72000 units by mouth with each meal and 36000 units by mouth with each snack 09/17/19   Armbruster, Carlota Raspberry, MD  losartan (COZAAR) 50 MG tablet Take 50 mg by mouth daily. 02/11/19   [provider]  metoprolol succinate (TOPROL-XL) 50 MG 24 hr tablet Take 1 tablet (50 mg total) by mouth daily. Take with or immediately following a meal. Patient taking differently: Take 50 mg by mouth in the morning and at  bedtime.  12/22/19   Fay Records, MD  Multiple Vitamins-Minerals (ADULT ONE DAILY GUMMIES) CHEW Chew 1 capsule by mouth daily.    [provider]  NARCAN 4 MG/0.1ML LIQD nasal spray kit Place 1 spray into the nose as needed (opioid overdose).  11/25/19   [provider]  potassium chloride (K-DUR) 10 MEQ tablet Take 10 mEq by mouth daily as needed (For cramps).     [provider]  rosuvastatin (CRESTOR) 40 MG tablet Take 1 tablet (40 mg total) by mouth daily. 12/22/19   Fay Records, MD  Toremifene Citrate (FARESTON) 60 MG tablet Take 1 tablet (60 mg total) by mouth daily. Patient not taking: Reported on 01/23/2020 09/27/19   Lyndee Hensen, DO  torsemide (DEMADEX) 20 MG tablet Take 2 tablets (40 mg total) by mouth daily. 09/27/19  01/23/20  Lyndee Hensen, DO    Current Facility-Administered Medications  Medication Dose Route Frequency Provider Last Rate Last Admin  .  stroke: mapping our early stages of recovery book   Does not apply Once Beulah Gandy A, NP      . acetaminophen (TYLENOL) tablet 650 mg  650 mg Oral Q6H PRN Annita Brod, MD   650 mg at 02/26/20 0826  . ceFEPIme (MAXIPIME) 2 g in sodium chloride 0.9 % 100 mL IVPB  2 g Intravenous Q24H Susa Raring, RPH 200 mL/hr at 02/29/20 0525 2 g at 02/29/20 0525  . Chlorhexidine Gluconate Cloth 2 % PADS 6 each  6 each Topical Daily Thurnell Lose, MD   6 each at 02/29/20 [phone]  . clopidogrel (PLAVIX) tablet 75 mg  75 mg Oral Daily Thurnell Lose, MD   75 mg at 02/28/20 0856  . collagenase (SANTYL) ointment   Topical Daily Thurnell Lose, MD   Given at 02/29/20 [phone]  . dextrose (GLUTOSE) 40 % oral gel 37.5 g  1 Tube Oral Once PRN Lala Lund K, MD      . dextrose 50 % solution 0-50 mL  0-50 mL Intravenous PRN Annita Brod, MD   50 mL at 02/29/20 0837  . fluticasone furoate-vilanterol (BREO ELLIPTA) 200-25 MCG/INH 1 puff  1 puff Inhalation Daily Thurnell Lose, MD   1 puff at 02/29/20 0737  . [START ON 03/01/2020] heparin injection 5,000 Units  5,000 Units Subcutaneous Q8H Singh, Prashant K, MD      . insulin aspart (novoLOG) injection 0-9 Units  0-9 Units Subcutaneous Q6H Singh, Prashant K, MD      . insulin glargine (LANTUS) injection 20 Units  20 Units Subcutaneous Daily Lala Lund K, MD      . lactated ringers bolus 1,000 mL  1,000 mL Intravenous Once Thurnell Lose, MD   Stopped at 02/26/20 1041  . lactated ringers infusion   Intravenous Continuous Thurnell Lose, MD 100 mL/hr at 02/29/20 1049 Rate Change at 02/29/20 1049  . lipase/protease/amylase (CREON) capsule 36,000 Units  36,000 Units Oral BID BM PRN Annita Brod, MD      . lipase/protease/amylase (CREON) capsule 72,000 Units  72,000 Units Oral TID WC Annita Brod, MD   72,000 Units at 02/27/20 1639  . naloxone (NARCAN) 0.4 MG/ML injection           . naloxone [facility]) injection 0.4 mg  0.4 mg Intravenous PRN Thurnell Lose, MD   0.1 mg at 02/29/20 0921  . ondansetron (ZOFRAN) injection 4 mg  4 mg Intravenous Q6H PRN Gevena Barre  K, MD   4 mg at 02/25/20 1343  . pantoprazole (PROTONIX) EC tablet 40 mg  40 mg Oral Daily Thurnell Lose, MD   40 mg at 02/28/20 0856  . polyethylene glycol (MIRALAX / GLYCOLAX) packet 17 g  17 g Oral Daily PRN Annita Brod, MD        Allergies as of 02/23/2020 - Review Complete 02/23/2020  Allergen Reaction Noted  . Tramadol Swelling 04/15/2018  . Nsaids Other (See Comments) 12/01/2014  . Tolmetin Other (See Comments) 12/01/2014  . Tylenol [acetaminophen] Other (See Comments) 02/23/2020  . Aspirin Other (See Comments) 04/08/2015    Family History  Problem Relation Age of Onset  . Diabetes Other   . Heart disease Other   . Breast cancer Maternal Grandmother   . Breast cancer Paternal Grandmother   . Stroke Son   . Colon cancer Neg Hx   . Esophageal cancer Neg Hx   . Rectal cancer Neg Hx   . Stomach cancer Neg Hx     Social History   Socioeconomic History  . Marital status: Single    Spouse name: Not on file  . Number of children: Not on file  . Years of education: Not on file  . Highest education level: Not on file  Occupational History  . Not on file  Tobacco Use  . Smoking status: Light Tobacco Smoker    Types: Cigarettes    Last attempt to quit: 06/04/2019    Years since quitting: 0.7  . Smokeless tobacco: Never Used  Vaping Use  . Vaping Use: Never used  Substance and Sexual Activity  . Alcohol use: Not Currently    Comment: Beer - "on the weekends hanging out, maybe 2 or 3"   . Drug use: Not Currently    Types: Marijuana    Comment: last used 8 2020  . Sexual activity: Not on file  Other Topics Concern  . Not on file  Social History Narrative  . Not on file    Social Determinants of Health   Financial Resource Strain:   . Difficulty of Paying Living Expenses: Not on file  Food Insecurity:   . Worried About Charity fundraiser in the Last Year: Not on file  . Ran Out of Food in the Last Year: Not on file  Transportation Needs:   . Lack of Transportation (Medical): Not on file  . Lack of Transportation (Non-Medical): Not on file  Physical Activity:   . Days of Exercise per Week: Not on file  . Minutes of Exercise per Session: Not on file  Stress:   . Feeling of Stress : Not on file  Social Connections:   . Frequency of Communication with Friends and Family: Not on file  . Frequency of Social Gatherings with Friends and Family: Not on file  . Attends Religious Services: Not on file  . Active Member of Clubs or Organizations: Not on file  . Attends Archivist Meetings: Not on file  . Marital Status: Not on file  Intimate Partner Violence:   . Fear of Current or Ex-Partner: Not on file  . Emotionally Abused: Not on file  . Physically Abused: Not on file  . Sexually Abused: Not on file    Review of Systems: Pertinent positive and negative review of systems were noted in the above HPI section.  All other review of systems was otherwise negative.Marland Kitchen  Physical Exam: Vital signs in last 24 hours: Temp:  [97.4  F (36.3 C)-98.6 F (37 C)] 98.6 F (37 C) (10/31 0804) Pulse Rate:  [75-81] 79 (10/31 0804) Resp:  [15-17] 15 (10/31 0804) BP: (100-122)/(65-94) 100/71 (10/31 0804) SpO2:  [98 %-100 %] 100 % (10/31 0804) FiO2 (%):  [28 %] 28 % (10/31 0737)   General:   Alert,  Well-developed, well-nourished, older African-American female somnolent but arousable attempts to answer questions but speech garbled Head:  Normocephalic and atraumatic. Eyes:  Sclera clear, no icterus.   Conjunctiva pink. Ears:  Normal auditory acuity. Nose:  No deformity, discharge,  or lesions. Mouth:  No deformity or lesions.   Neck:  Supple; no masses  or thyromegaly. Lungs:  Clear throughout to auscultation.   No wheezes, crackles, or rhonchi. Heart:  Regular rate and rhythm; no murmurs, clicks, rubs,  or gallops. Abdomen: Obese, soft, no focal tenderness no guarding no rebound bowel sounds are present Rectal: Not done Msk:  Symmetrical without gross deformities. . Pulses:  Normal pulses noted. Extremities:  Without clubbing or edema. Neurologic: Somnolent, arousable, speech garbled. Skin:  Intact without significant lesions or rashes..    Lab Results: Recent Labs    02/27/20 0500 02/28/20 0500 02/29/20 0455  WBC 7.7 7.3 7.7  HGB 10.6* 8.5* 9.9*  HCT 35.0* 28.2* 32.2*  PLT 371 319 378   BMET Recent Labs  Lab 02/25/20 0254 02/25/20 0254 02/26/20 0148 02/26/20 0343 02/26/20 0343 02/27/20 0500 02/27/20 0500 02/28/20 0500 02/29/20 0455  NA 134*  --   --  134*  --  133*  --  134* 134*  K 4.1   < >  --  5.4*   < > 4.1   < > 2.9* 4.7  CL 104  --   --  103  --  101  --  122* 104  CO2 21*  --   --  22  --  22  --  18* 23  GLUCOSE 136*  --   --  120*  --  164*  --  121* 80  BUN 22*  --   --  31*  --  39*  --  33* 39*  CREATININE 1.80*  --   --  2.52*  --  2.88*  --  2.38* 2.73*  CALCIUM 7.6*  --   --  7.8*  --  7.7*  --  5.8* 7.6*  MG  --   --  1.5* 1.5*  --  2.0  --  1.4* 2.3   < > = values in this interval not displayed.   LFT Recent Labs  Lab 02/23/20 0952 02/24/20 0355 02/26/20 0343 02/27/20 0500 02/28/20 0500 02/29/20 0455  AST 18  --  66* 920* 829* 758*  ALT 18  --  24 181* 235* 341*  ALKPHOS 158*  --  137* 184* 181* 290*  BILITOT 0.6  --  0.7 0.4 0.6 0.6  PROT 7.3  --  5.9* 5.9* 5.4* 4.9*  ALBUMIN 2.6*  --  1.6* 1.6* 1.1* 1.4*  INR 1.2 1.4* 1.7*  --  3.2* 2.1*    PT/INR Recent Labs    02/28/20 0500 02/29/20 0455  LABPROT 31.4* 22.8*  INR 3.2* 2.1*   Hepatitis Panel Recent Labs    02/28/20 0500  HEPBSAG NON REACTIVE  HCVAB NON REACTIVE  HEPAIGM NON REACTIVE  HEPBIGM NON REACTIVE      IMPRESSION:  #22 59 year old African-American female admitted 02/23/2020 with left foot pain, subsequently found to be septic with Serratia bacteremia felt secondary to left  lower extremity cellulitis and nonhealing foot ulcer. On cefepime and Flagyl  #2 new LFT elevation since admit Abdominal ultrasound shows distended gallbladder with fluid in the gallbladder fossa sludge in the gallbladder neck no gallbladder wall thickening, CBD of 3 mm cannot rule out acute cholecystitis. HIDA pending today Also consider component of shock liver/reactive secondary to sepsis  #3 subacute CVA since admit neuro evaluating today Question embolic #4 history of chronic calcific pancreatitis and chronic abdominal pain had been followed by pain management as an outpatient.  Gabapentin/Elavil and Creon previously  #5 poorly controlled adult onset diabetes mellitus 6.  Congestive heart failure 7.  Peripheral arterial disease status post left femoropopliteal bypass June 2021 8.  Chronic kidney disease 9.  COPD 10.  Sleep apnea  Plan; await HIDA scan, if HIDA negative ,consider CT of the abdomen and pelvis Ultrasound with Doppler also ordered and pending Trend LFTs trend INR  GI will follow with you   Amy Esterwood PA-C 02/29/2020, 2:04 PM    I have also seen and evaluated the patient and agree with Ms. Genia Harold assessment and plan.  I have the following additions.   Etiology of her abnormal LFTs is probably multifactorial I think sepsis and hypotension and possible shock liver could be playing a role.  Jump in creatinine supports this theory as well.  She had a significantl coagulopathy that is improved with vitamin K and fat malabsorption from pancreatitis plus or minus antibiotics affecting intestinal flora might of had something to do with the INR going out as opposed to the liver itself.  Fortunately it is improving we will follow that.  I think unlikely she has cholecystitis but await HIDA  scan and surgery follow-up.   Gatha Mayer, MD, [facility] 02/29/2020 6:24 PM

## 2020-02-29 NOTE — Consult Note (Addendum)
Neurology Consultation  Reason for Consult: stroke Referring Physician: Dr. Candiss Norse  CC: wound infection   History is obtained from: medical record.   HPI: Morgan Beasley is a 59 y.o. female with past medical history of prior substance abuse (THC, cocaine),  obesity, poorly controlled diabetes mellitus, CAD, systolic/diastolic CHFongoing tobacco abuse, stage II CKD, COPD not on home oxygen, OSA on nightly BiPAP, and hypertension plus PVD/PAD status post angioplasty and stent placement in the past and  left femoral to below-knee popliteal bypass 09/2019 with nonhealing left foot ulcerwho presented the emergency room complaining of left leg and foot pain. patient is a poor historian.  Ct head was done for encephalopathy and revealed a subacute right occipital infarct and neurology was consulted   Patient is lying in bed with [place] on, slightly drowsy and requires redirection in NAD. daughter is at bedside.     LKW: unknown tpa given?: no, outside TPA window Premorbid modified Rankin scale (mRS):   4-Needs assistance to walk and tend to bodily needs    ROS:. Unable to obtain due to altered mental status.   Past Medical History:  Diagnosis Date  . Alcohol dependence ([place])   . Anemia   . Anxiety   . Breast cancer ([place])   . Chronic combined systolic and diastolic CHF (congestive heart failure) ([place])   . Cigarette nicotine dependence   . CKD (chronic kidney disease), stage III ([place])   . Colon polyps   . COPD (chronic obstructive pulmonary disease) ([place])   . Diabetes mellitus without complication ([place])   . Diabetic neuropathy ([place])   . Elevated lipase   . Gout   . Hyperlipemia   . Hypertension   . Insomnia   . Lymphedema   . Marijuana use   . Mild CAD 2016   a. NSTEMI 2016 in context of cocaine abuse, 50% RCA% at that time.  . OSA treated with BiPAP   . PAD (peripheral artery disease) ([place])    a. s/p L SFA stenting 08/2019. b. left fem-to-below-knee-popliteal bypass 09/2019.  Marland Kitchen  Sleep apnea    wears BIPAP  . Ulcer of foot ([place])    right    Essential (primary) hypertension, Chronic systolic (congestive) heart failure, Type 2 diabetes mellitus with hyperglycemia , Obesity and Possible Sepsis   Family History  Problem Relation Age of Onset  . Diabetes Other   . Heart disease Other   . Breast cancer Maternal Grandmother   . Breast cancer Paternal Grandmother   . Stroke Son   . Colon cancer Neg Hx   . Esophageal cancer Neg Hx   . Rectal cancer Neg Hx   . Stomach cancer Neg Hx      Social History:   reports that she has been smoking cigarettes. She has never used smokeless tobacco. She reports previous alcohol use. She reports previous drug use. Drug: Marijuana.  Medications  Current Facility-Administered Medications:  .  acetaminophen (TYLENOL) tablet 650 mg, 650 mg, Oral, Q6H PRN, 650 mg at 02/26/20 0826 **OR** [DISCONTINUED] acetaminophen (TYLENOL) suppository 650 mg, 650 mg, Rectal, Q6H PRN, Annita Brod, MD .  ceFEPIme (MAXIPIME) 2 g in sodium chloride 0.9 % 100 mL IVPB, 2 g, Intravenous, Q24H, Susa Raring, RPH, Last Rate: 200 mL/hr at 02/29/20 0525, 2 g at 02/29/20 0525 .  Chlorhexidine Gluconate Cloth 2 % PADS 6 each, 6 each, Topical, Daily, Thurnell Lose, MD, 6 each at 02/29/20 [phone] .  clopidogrel (PLAVIX) tablet 75 mg, 75  mg, Oral, Daily, Thurnell Lose, MD, 75 mg at 02/28/20 0856 .  collagenase (SANTYL) ointment, , Topical, Daily, Thurnell Lose, MD, Given at 02/29/20 [phone]  dextrose (GLUTOSE) 40 % oral gel 37.5 g, 1 Tube, Oral, Once PRN, Candiss Norse, Prashant K, MD .  dextrose 50 % solution 0-50 mL, 0-50 mL, Intravenous, PRN, Annita Brod, MD, 50 mL at 02/29/20 0837 .  fluticasone furoate-vilanterol (BREO ELLIPTA) 200-25 MCG/INH 1 puff, 1 puff, Inhalation, Daily, Thurnell Lose, MD, 1 puff at 02/29/20 0737 .  [START ON 03/01/2020] heparin injection 5,000 Units, 5,000 Units, Subcutaneous, Q8H, Singh, Prashant K, MD .  insulin  aspart (novoLOG) injection 0-9 Units, 0-9 Units, Subcutaneous, Q6H, Singh, Prashant K, MD .  insulin glargine (LANTUS) injection 20 Units, 20 Units, Subcutaneous, Daily, Candiss Norse, Prashant K, MD .  lactated ringers bolus 1,000 mL, 1,000 mL, Intravenous, Once, Thurnell Lose, MD, Stopped at 02/26/20 1041 .  lactated ringers infusion, , Intravenous, Continuous, Thurnell Lose, MD, Last Rate: 100 mL/hr at 02/29/20 1049, Rate Change at 02/29/20 1049 .  lipase/protease/amylase (CREON) capsule 36,000 Units, 36,000 Units, Oral, BID BM PRN, Annita Brod, MD .  lipase/protease/amylase (CREON) capsule 72,000 Units, 72,000 Units, Oral, TID WC, Annita Brod, MD, 72,000 Units at 02/27/20 1639 .  naloxone [facility]) 0.4 MG/ML injection, , , ,  .  naloxone (NARCAN) injection 0.4 mg, 0.4 mg, Intravenous, PRN, Thurnell Lose, MD, 0.1 mg at 02/29/20 0921 .  [DISCONTINUED] ondansetron (ZOFRAN) tablet 4 mg, 4 mg, Oral, Q6H PRN **OR** ondansetron (ZOFRAN) injection 4 mg, 4 mg, Intravenous, Q6H PRN, Annita Brod, MD, 4 mg at 02/25/20 1343 .  pantoprazole (PROTONIX) EC tablet 40 mg, 40 mg, Oral, Daily, Thurnell Lose, MD, 40 mg at 02/28/20 0856 .  polyethylene glycol (MIRALAX / GLYCOLAX) packet 17 g, 17 g, Oral, Daily PRN, Annita Brod, MD   Exam: Current vital signs: BP 100/71 (BP Location: Left Leg)   Pulse 79   Temp 98.6 F (37 C) (Axillary)   Resp 15   Ht 5\' 6"  (1.676 m)   Wt 90.3 kg   SpO2 100%   BMI 32.12 kg/m  Vital signs in last 24 hours: Temp:  [97.4 F (36.3 C)-98.6 F (37 C)] 98.6 F (37 C) (10/31 0804) Pulse Rate:  [75-81] 79 (10/31 0804) Resp:  [15-17] 15 (10/31 0804) BP: (100-122)/(65-94) 100/71 (10/31 0804) SpO2:  [98 %-100 %] 100 % (10/31 0804) FiO2 (%):  [28 %] 28 % (10/31 0737)  GENERAL: drowsy, arousable to voice, in NAD HENT: - edentulous, Normocephalic and atraumatic, dry mm, no LN++, no Thyromegally Eyes: normal conjunctiva LUNGS - Clear to  auscultation bilaterally with no wheezes CV - S1S2 RRR, no m/r/g, equal pulses bilaterally. ABDOMEN - Soft, nontender, nondistended with normoactive BS Ext: left upper arm with scattered blisters, warm, well perfused, intact peripheral pulses, 3 +edema Psych: flat affect   NEURO:  Mental Status: Confused, able to state her name, slow to respond, follows simple commands, poor effort in exam  Language: speech is garbled and dysarthric.  Able to name objects and repeat Cranial Nerves: PERRL 25mm/brisk. EOMI, visual fields full to threat,    Left facial asymmetry, facial sensation intact, hearing intact, tongue/uvula/soft palate midline, normal sternocleidomastoid and trapezius muscle strength. No evidence of tongue atrophy or fibrillations Motor: moves upper arms antigravity, left appears to be weaker, 4/5,  bilateral legs 2/5 with poor effort Tone: is normal and bulk is normal Sensation-  Intact to light touch bilaterally Coordination: FTN intact  Gait- deferred  NIHSS   1a Level of Conscious.: arousable by stimuli 1 1b LOC Questions: none correct 2 1c LOC Commands: both correct 0 2 Best Gaze: normal 0 3 Visual: no visual  loss 0 4 Facial Palsy: minor 1 5a Motor Arm - left: some effort 2 5b Motor Arm - Right: drift 1 6a Motor Leg - Left: some effort 2 6b Motor Leg - Right: some effort 2 7 Limb Ataxia: none 0 8 Sensory: normal 0 9 Best Language: mild 1 10 Dysarthria: moderate 1 11 Extinct. and Inatten.: none 0 TOTAL: 13   Labs I have reviewed labs in epic and the results pertinent to this consultation are:  CBC    Component Value Date/Time   WBC 7.7 02/29/2020 0455   RBC 3.95 02/29/2020 0455   HGB 9.9 (L) 02/29/2020 0455   HGB 12.0 11/17/2019 1449   HCT 32.2 (L) 02/29/2020 0455   HCT 37.8 11/17/2019 1449   PLT 378 02/29/2020 0455   PLT 431 11/17/2019 1449   MCV 81.5 02/29/2020 0455   MCV 83 11/17/2019 1449   MCH 25.1 (L) 02/29/2020 0455   MCHC 30.7 02/29/2020 0455    RDW 18.8 (H) 02/29/2020 0455   RDW 17.5 (H) 11/17/2019 1449   LYMPHSABS 0.9 02/29/2020 0455   MONOABS 0.9 02/29/2020 0455   EOSABS 0.1 02/29/2020 0455   BASOSABS 0.0 02/29/2020 0455    CMP     Component Value Date/Time   NA 134 (L) 02/29/2020 0455   NA 137 02/09/2020 0825   K 4.7 02/29/2020 0455   CL 104 02/29/2020 0455   CO2 23 02/29/2020 0455   GLUCOSE 80 02/29/2020 0455   BUN 39 (H) 02/29/2020 0455   BUN 14 02/09/2020 0825   CREATININE 2.73 (H) 02/29/2020 0455   CALCIUM 7.6 (L) 02/29/2020 0455   PROT 4.9 (L) 02/29/2020 0455   ALBUMIN 1.4 (L) 02/29/2020 0455   AST 758 (H) 02/29/2020 0455   ALT 341 (H) 02/29/2020 0455   ALKPHOS 290 (H) 02/29/2020 0455   BILITOT 0.6 02/29/2020 0455   GFRNONAA 20 (L) 02/29/2020 0455   GFRAA 65 02/09/2020 0825    Lipid Panel     Component Value Date/Time   CHOL 219 (H) 02/09/2020 0825   TRIG 175 (H) 02/09/2020 0825   HDL 53 02/09/2020 0825   CHOLHDL 4.1 02/09/2020 0825   CHOLHDL 3.8 10/11/2019 1101   VLDL 22 10/11/2019 1101   LDLCALC 135 (H) 02/09/2020 0825     Imaging I have reviewed the images obtained:  CT-scan of the brain Decreased attenuation in the right temporal occipital cortex and in the left occipital cortex  MRI examination of the brain Acute infarction of the lateral right occipital lobe and parietotemporal extension  MRA head/neck No large vessel occlusion or stenosis   Assessment:  Morgan Beasley is a 59 y.o. female with past medical history of obesity, poorly controlled diabetes mellitus, CAD, systolic CHF and hypertension plus PVD/PAD status post angioplasty and stent placement in the past who presented the emergency room complaining of left leg and foot pain. 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  She has what appears to be embolic infarcts versus posterior watershed she will need further work-up with MRI.  Roland Rack, MD [facility] [phone]  If 7pm- 7am, please page neurology on call as listed in [place].

## 2020-03-01 ENCOUNTER — Inpatient Hospital Stay (HOSPITAL_COMMUNITY): Payer: Medicare Other

## 2020-03-01 ENCOUNTER — Encounter (HOSPITAL_COMMUNITY): Payer: Self-pay | Admitting: Internal Medicine

## 2020-03-01 ENCOUNTER — Encounter (HOSPITAL_COMMUNITY): Payer: Medicare Other

## 2020-03-01 DIAGNOSIS — I70244 Atherosclerosis of native arteries of left leg with ulceration of heel and midfoot: Secondary | ICD-10-CM

## 2020-03-01 DIAGNOSIS — I63431 Cerebral infarction due to embolism of right posterior cerebral artery: Secondary | ICD-10-CM

## 2020-03-01 DIAGNOSIS — I6389 Other cerebral infarction: Secondary | ICD-10-CM

## 2020-03-01 DIAGNOSIS — I6340 Cerebral infarction due to embolism of unspecified cerebral artery: Secondary | ICD-10-CM | POA: Insufficient documentation

## 2020-03-01 DIAGNOSIS — R6521 Severe sepsis with septic shock: Secondary | ICD-10-CM | POA: Diagnosis not present

## 2020-03-01 DIAGNOSIS — A419 Sepsis, unspecified organism: Secondary | ICD-10-CM

## 2020-03-01 LAB — BRAIN NATRIURETIC PEPTIDE: B Natriuretic Peptide: 808.8 pg/mL — ABNORMAL HIGH (ref 0.0–100.0)

## 2020-03-01 LAB — LIPID PANEL
Cholesterol: 116 mg/dL (ref 0–200)
HDL: 22 mg/dL — ABNORMAL LOW (ref >40)
LDL Cholesterol: 77 mg/dL (ref 0–99)
Total CHOL/HDL Ratio: 5.3 RATIO
Triglycerides: 83 mg/dL (ref <150)
VLDL: 17 mg/dL (ref 0–40)

## 2020-03-01 LAB — COMPREHENSIVE METABOLIC PANEL WITH GFR
ALT: 213 U/L — ABNORMAL HIGH (ref 0–44)
AST: 266 U/L — ABNORMAL HIGH (ref 15–41)
Albumin: 1.2 g/dL — ABNORMAL LOW (ref 3.5–5.0)
Alkaline Phosphatase: 214 U/L — ABNORMAL HIGH (ref 38–126)
Anion gap: <3 — ABNORMAL LOW (ref 5–15)
BUN: 27 mg/dL — ABNORMAL HIGH (ref 6–20)
CO2: 20 mmol/L — ABNORMAL LOW (ref 22–32)
Calcium: 6.4 mg/dL — CL (ref 8.9–10.3)
Chloride: 120 mmol/L — ABNORMAL HIGH (ref 98–111)
Creatinine, Ser: 1.87 mg/dL — ABNORMAL HIGH (ref 0.44–1.00)
GFR, Estimated: 31 mL/min — ABNORMAL LOW
Glucose, Bld: 64 mg/dL — ABNORMAL LOW (ref 70–99)
Potassium: 3.3 mmol/L — ABNORMAL LOW (ref 3.5–5.1)
Sodium: 135 mmol/L (ref 135–145)
Total Bilirubin: 0.6 mg/dL (ref 0.3–1.2)
Total Protein: 5.4 g/dL — ABNORMAL LOW (ref 6.5–8.1)

## 2020-03-01 LAB — GLUCOSE, CAPILLARY
Glucose-Capillary: 135 mg/dL — ABNORMAL HIGH (ref 70–99)
Glucose-Capillary: 68 mg/dL — ABNORMAL LOW (ref 70–99)
Glucose-Capillary: 100 mg/dL — ABNORMAL HIGH (ref 70–99)
Glucose-Capillary: 101 mg/dL — ABNORMAL HIGH (ref 70–99)
Glucose-Capillary: 131 mg/dL — ABNORMAL HIGH (ref 70–99)

## 2020-03-01 LAB — CBC WITH DIFFERENTIAL/PLATELET
Abs Immature Granulocytes: 0.04 K/uL (ref 0.00–0.07)
Basophils Absolute: 0 K/uL (ref 0.0–0.1)
Basophils Relative: 0 %
Eosinophils Absolute: 0.1 K/uL (ref 0.0–0.5)
Eosinophils Relative: 2 %
HCT: 28.2 % — ABNORMAL LOW (ref 36.0–46.0)
Hemoglobin: 8.5 g/dL — ABNORMAL LOW (ref 12.0–15.0)
Immature Granulocytes: 1 %
Lymphocytes Relative: 17 %
Lymphs Abs: 1.2 K/uL (ref 0.7–4.0)
MCH: 24.4 pg — ABNORMAL LOW (ref 26.0–34.0)
MCHC: 30.1 g/dL (ref 30.0–36.0)
MCV: 81 fL (ref 80.0–100.0)
Monocytes Absolute: 0.8 K/uL (ref 0.1–1.0)
Monocytes Relative: 12 %
Neutro Abs: 4.6 K/uL (ref 1.7–7.7)
Neutrophils Relative %: 68 %
Platelets: 350 K/uL (ref 150–400)
RBC: 3.48 MIL/uL — ABNORMAL LOW (ref 3.87–5.11)
RDW: 19.1 % — ABNORMAL HIGH (ref 11.5–15.5)
WBC: 6.8 K/uL (ref 4.0–10.5)
nRBC: 2.8 % — ABNORMAL HIGH (ref 0.0–0.2)

## 2020-03-01 LAB — MAGNESIUM: Magnesium: 1.6 mg/dL — ABNORMAL LOW (ref 1.7–2.4)

## 2020-03-01 LAB — ECHOCARDIOGRAM COMPLETE
Area-P 1/2: 5.38 cm2
Height: 66 in
S' Lateral: 3.7 cm
Weight: 3184 oz

## 2020-03-01 LAB — PROTIME-INR
INR: 2.7 — ABNORMAL HIGH (ref 0.8–1.2)
Prothrombin Time: 27.6 s — ABNORMAL HIGH (ref 11.4–15.2)

## 2020-03-01 MED ORDER — ADULT MULTIVITAMIN W/MINERALS CH
1.0000 | ORAL_TABLET | Freq: Every day | ORAL | Status: DC
  Administered 2020-03-01 – 2020-03-18 (×17): 1 via ORAL
  Filled 2020-03-01 (×18): qty 1

## 2020-03-01 MED ORDER — HYDROMORPHONE HCL 1 MG/ML IJ SOLN
1.0000 mg | Freq: Once | INTRAMUSCULAR | Status: AC
  Administered 2020-03-01: 1 mg via INTRAVENOUS
  Filled 2020-03-01: qty 1

## 2020-03-01 MED ORDER — MAGNESIUM SULFATE 2 GM/50ML IV SOLN
2.0000 g | Freq: Once | INTRAVENOUS | Status: AC
  Administered 2020-03-01: 2 g via INTRAVENOUS
  Filled 2020-03-01: qty 50

## 2020-03-01 MED ORDER — POTASSIUM CHLORIDE 10 MEQ/100ML IV SOLN
10.0000 meq | INTRAVENOUS | Status: AC
  Administered 2020-03-01 (×3): 10 meq via INTRAVENOUS
  Filled 2020-03-01 (×3): qty 100

## 2020-03-01 MED ORDER — ZOLPIDEM TARTRATE 5 MG PO TABS
5.0000 mg | ORAL_TABLET | Freq: Every evening | ORAL | Status: DC | PRN
  Administered 2020-03-01 – 2020-03-07 (×5): 5 mg via ORAL
  Filled 2020-03-01 (×5): qty 1

## 2020-03-01 MED ORDER — CALCIUM GLUCONATE-NACL 1-0.675 GM/50ML-% IV SOLN
1.0000 g | Freq: Once | INTRAVENOUS | Status: AC
  Administered 2020-03-01: 1000 mg via INTRAVENOUS
  Filled 2020-03-01: qty 50

## 2020-03-01 MED ORDER — HALOPERIDOL LACTATE 5 MG/ML IJ SOLN
1.0000 mg | Freq: Once | INTRAMUSCULAR | Status: AC
  Administered 2020-03-01: 1 mg via INTRAVENOUS
  Filled 2020-03-01: qty 1

## 2020-03-01 MED ORDER — VITAMIN K1 10 MG/ML IJ SOLN
2.0000 mg | Freq: Once | INTRAVENOUS | Status: AC
  Administered 2020-03-01: 2 mg via INTRAVENOUS
  Filled 2020-03-01: qty 0.2

## 2020-03-01 MED ORDER — ENSURE ENLIVE PO LIQD
237.0000 mL | Freq: Three times a day (TID) | ORAL | Status: DC
  Administered 2020-03-01 – 2020-03-05 (×10): 237 mL via ORAL

## 2020-03-01 MED ORDER — OXYCODONE HCL 5 MG PO TABS
5.0000 mg | ORAL_TABLET | Freq: Four times a day (QID) | ORAL | Status: AC | PRN
  Administered 2020-03-02 – 2020-03-03 (×4): 5 mg via ORAL
  Filled 2020-03-01 (×4): qty 1

## 2020-03-01 MED ORDER — LACTATED RINGERS IV SOLN: INTRAVENOUS | Status: AC

## 2020-03-01 NOTE — Progress Notes (Signed)
STROKE TEAM PROGRESS NOTE   INTERVAL HISTORY Her one daughter is at the bedside and the other daughter is on the phone.  Pt lying in bed, RN just finished cleaning her. Pt awake alert and eyes open, however, not orientated to time, age. Able to tell me she is in hospital and had several attempts before correctly told me her daughter's name. No focal deficit on exam, but BUE asterixis and left foot unhealing wound. Plan for TEE Thursday.   Vitals:   02/29/20 2307 03/01/20 0311 03/01/20 0723 03/01/20 0727  BP: (!) 141/83 (!) 147/77  (!) 144/84  Pulse: 89 92 87 89  Resp: 20 20 (!) 22 17  Temp: 98.2 F (36.8 C) 98.4 F (36.9 C)  98.2 F (36.8 C)  TempSrc: Oral Oral  Oral  SpO2: 95% 91% 95% 94%  Weight:      Height:       CBC:  Recent Labs  Lab 02/29/20 0455 03/01/20 0500  WBC 7.7 6.8  NEUTROABS 5.7 4.6  HGB 9.9* 8.5*  HCT 32.2* 28.2*  MCV 81.5 81.0  PLT 378 258   Basic Metabolic Panel:  Recent Labs  Lab 02/29/20 0455 03/01/20 0500  NA 134* 135  K 4.7 3.3*  CL 104 120*  CO2 23 20*  GLUCOSE 80 64*  BUN 39* 27*  CREATININE 2.73* 1.87*  CALCIUM 7.6* 6.4*  MG 2.3 1.6*   Lipid Panel:  Recent Labs  Lab 03/01/20 0805  CHOL 116  TRIG 83  HDL 22*  CHOLHDL 5.3  VLDL 17  LDLCALC 77   HgbA1c:  Recent Labs  Lab 02/23/20 1453  HGBA1C 15.1*   Urine Drug Screen: No results for input(s): LABOPIA, COCAINSCRNUR, LABBENZ, AMPHETMU, THCU, LABBARB in the last 168 hours.  Alcohol Level No results for input(s): ETH in the last 168 hours.  IMAGING past 24 hours MR ANGIO HEAD WO CONTRAST  Result Date: 02/29/2020 CLINICAL DATA:  Abnormal CT EXAM: MRI HEAD WITHOUT CONTRAST MRA HEAD WITHOUT CONTRAST MRA NECK WITHOUT CONTRAST TECHNIQUE: Multiplanar, multiecho pulse sequences of the brain and surrounding structures were obtained without intravenous contrast. Angiographic images of the Circle of Willis were obtained using MRA technique without intravenous contrast. Angiographic  images of the neck were obtained using MRA technique without intravenous contrast. Carotid stenosis measurements (when applicable) are obtained utilizing NASCET criteria, using the distal internal carotid diameter as the denominator. COMPARISON:  None. FINDINGS: MRI HEAD Brain: There is restricted diffusion in the lateral right occipital lobe with some parietotemporal extension reflecting acute infarction. Chronic left occipital infarct. Few additional patchy foci of T2 hyperintensity in the supratentorial and pontine white matter are nonspecific but probably reflect mild chronic microvascular ischemic changes. Foci of susceptibility are present in the posterior left lentiform nucleus, left thalamus, and midline cerebellum likely reflecting chronic microhemorrhages. There is no intracranial mass or significant mass effect. There is no hydrocephalus or extra-axial fluid collection. Vascular: Major vessel flow voids at the skull base are preserved. Skull and upper cervical spine: Normal marrow signal is preserved. Sinuses/Orbits: Paranasal sinuses are aerated. Orbits are unremarkable. Other: Sella is unremarkable.  Mastoid air cells are clear. MRA HEAD Intracranial internal carotid arteries are patent. Middle and anterior cerebral arteries are patent. Included intracranial vertebral arteries, basilar artery, posterior cerebral arteries are patent. There is no significant stenosis or aneurysm. MRA NECK Included portions of the common carotid arteries are patent. Internal and external carotid arteries are patent. There is no hemodynamically significant stenosis at the ICA  origins by NASCET criteria. Included extracranial vertebral arteries are patent and codominant. IMPRESSION: Acute infarction of the lateral right occipital lobe with some parietotemporal extension. Chronic left occipital infarct. Mild chronic microvascular ischemic changes. No large vessel occlusion or hemodynamically significant stenosis.  Electronically Signed   By: Macy Mis M.D.   On: 02/29/2020 13:40   MR ANGIO NECK WO CONTRAST  Result Date: 02/29/2020 CLINICAL DATA:  Abnormal CT EXAM: MRI HEAD WITHOUT CONTRAST MRA HEAD WITHOUT CONTRAST MRA NECK WITHOUT CONTRAST TECHNIQUE: Multiplanar, multiecho pulse sequences of the brain and surrounding structures were obtained without intravenous contrast. Angiographic images of the Circle of Willis were obtained using MRA technique without intravenous contrast. Angiographic images of the neck were obtained using MRA technique without intravenous contrast. Carotid stenosis measurements (when applicable) are obtained utilizing NASCET criteria, using the distal internal carotid diameter as the denominator. COMPARISON:  None. FINDINGS: MRI HEAD Brain: There is restricted diffusion in the lateral right occipital lobe with some parietotemporal extension reflecting acute infarction. Chronic left occipital infarct. Few additional patchy foci of T2 hyperintensity in the supratentorial and pontine white matter are nonspecific but probably reflect mild chronic microvascular ischemic changes. Foci of susceptibility are present in the posterior left lentiform nucleus, left thalamus, and midline cerebellum likely reflecting chronic microhemorrhages. There is no intracranial mass or significant mass effect. There is no hydrocephalus or extra-axial fluid collection. Vascular: Major vessel flow voids at the skull base are preserved. Skull and upper cervical spine: Normal marrow signal is preserved. Sinuses/Orbits: Paranasal sinuses are aerated. Orbits are unremarkable. Other: Sella is unremarkable.  Mastoid air cells are clear. MRA HEAD Intracranial internal carotid arteries are patent. Middle and anterior cerebral arteries are patent. Included intracranial vertebral arteries, basilar artery, posterior cerebral arteries are patent. There is no significant stenosis or aneurysm. MRA NECK Included portions of the  common carotid arteries are patent. Internal and external carotid arteries are patent. There is no hemodynamically significant stenosis at the ICA origins by NASCET criteria. Included extracranial vertebral arteries are patent and codominant. IMPRESSION: Acute infarction of the lateral right occipital lobe with some parietotemporal extension. Chronic left occipital infarct. Mild chronic microvascular ischemic changes. No large vessel occlusion or hemodynamically significant stenosis. Electronically Signed   By: Macy Mis M.D.   On: 02/29/2020 13:40   MR BRAIN WO CONTRAST  Result Date: 02/29/2020 CLINICAL DATA:  Abnormal CT EXAM: MRI HEAD WITHOUT CONTRAST MRA HEAD WITHOUT CONTRAST MRA NECK WITHOUT CONTRAST TECHNIQUE: Multiplanar, multiecho pulse sequences of the brain and surrounding structures were obtained without intravenous contrast. Angiographic images of the Circle of Willis were obtained using MRA technique without intravenous contrast. Angiographic images of the neck were obtained using MRA technique without intravenous contrast. Carotid stenosis measurements (when applicable) are obtained utilizing NASCET criteria, using the distal internal carotid diameter as the denominator. COMPARISON:  None. FINDINGS: MRI HEAD Brain: There is restricted diffusion in the lateral right occipital lobe with some parietotemporal extension reflecting acute infarction. Chronic left occipital infarct. Few additional patchy foci of T2 hyperintensity in the supratentorial and pontine white matter are nonspecific but probably reflect mild chronic microvascular ischemic changes. Foci of susceptibility are present in the posterior left lentiform nucleus, left thalamus, and midline cerebellum likely reflecting chronic microhemorrhages. There is no intracranial mass or significant mass effect. There is no hydrocephalus or extra-axial fluid collection. Vascular: Major vessel flow voids at the skull base are preserved. Skull  and upper cervical spine: Normal marrow signal is preserved. Sinuses/Orbits: Paranasal sinuses are  aerated. Orbits are unremarkable. Other: Sella is unremarkable.  Mastoid air cells are clear. MRA HEAD Intracranial internal carotid arteries are patent. Middle and anterior cerebral arteries are patent. Included intracranial vertebral arteries, basilar artery, posterior cerebral arteries are patent. There is no significant stenosis or aneurysm. MRA NECK Included portions of the common carotid arteries are patent. Internal and external carotid arteries are patent. There is no hemodynamically significant stenosis at the ICA origins by NASCET criteria. Included extracranial vertebral arteries are patent and codominant. IMPRESSION: Acute infarction of the lateral right occipital lobe with some parietotemporal extension. Chronic left occipital infarct. Mild chronic microvascular ischemic changes. No large vessel occlusion or hemodynamically significant stenosis. Electronically Signed   By: Macy Mis M.D.   On: 02/29/2020 13:40   US LIVER DOPPLER  Result Date: 03/01/2020 CLINICAL DATA:  Elevated LFTs.  Evaluate hepatic vasculature. EXAM: DUPLEX ULTRASOUND OF LIVER TECHNIQUE: Color and duplex Doppler ultrasound was performed to evaluate the hepatic in-flow and out-flow vessels. COMPARISON:  Right upper quadrant abdominal ultrasound-02/28/2020 FINDINGS: Liver: While there is preserved hepatic echogenicity, there is suspected mild nodularity hepatic contour. No discrete hepatic lesions. No intrahepatic biliary duct dilatation. Main Portal Vein size: 1.1 cm Portal Vein Velocities (normal hepatopetal directional flow) Main Prox:  21 cm/sec Main Mid: 21 cm/sec Main Dist:  23 cm/sec Right: 22 cm/sec Left: 13 cm/sec Hepatic Vein Velocities (normal hepatofugal directional flow) Right:  7 cm/sec Middle:  28 cm/sec Left:  20 cm/sec IVC: Present and patent with normal respiratory phasicity. Hepatic Artery Velocity:  58 cm/sec  Splenic Vein Velocity:  31 cm/sec Spleen: 8.6 cm x 3.2 cm x 1.6 cm with a total volume of 23 cm^3 (411 cm^3 is upper limit normal) Portal Vein Occlusion/Thrombus: No Splenic Vein Occlusion/Thrombus: No Ascites: None Varices: None IMPRESSION: 1. Patent hepatic vasculature with normal directional flow. 2. Suspected mild nodularity hepatic contour as could be seen in the setting of early cirrhotic change without stigmata of portal venous hypertension, specifically, no evidence of splenomegaly or intra-abdominal ascites. No discrete hepatic lesions. Electronically Signed   By: Sandi Mariscal M.D.   On: 03/01/2020 08:45    PHYSICAL EXAM  Temp:  [97.5 F (36.4 C)-98.4 F (36.9 C)] 98.2 F (36.8 C) (11/01 0727) Pulse Rate:  [87-92] 89 (11/01 0727) Resp:  [17-22] 17 (11/01 0727) BP: (133-147)/(77-96) 144/84 (11/01 0727) SpO2:  [91 %-98 %] 94 % (11/01 0727)  General - Well nourished, well developed, in no apparent distress. Mildly lethargic.  Ophthalmologic - fundi not visualized due to noncooperation.  Cardiovascular - Regular rhythm and rate.  Neuro - awake alert, eyes open, psychomotor slowing. Orientated to self and people (with several attempt), however, not orientated to age or time. Moderate dysarthria, paucity of speech, but no aphasia, able to name 2/4 and repeat in dysarthric voice. Visual field full, no gaze preference, facial symmetrical, tongue midline. PERRL. BUE 3/5 with asterixis, BLE proximal 2/5 and knee flexion 3-/5 and ankle PF 3/5. Left foot unhealing wound with pus. Sensation subjectively symmetrical. B/l FTN slow but grossly intact. Gait not tested.  ASSESSMENT/PLAN Ms. Arlethia Basso is a 59 y.o. female with history of prior substance abuse (THC, cocaine),  obesity, poorly controlled diabetes mellitus, CAD, systolic/diastolic CHF, ongoing tobacco abuse, stage II CKD, COPD not on home oxygen, OSA on nightly BiPAP, hypertension, PVD/PAD status post angioplasty and stent  placement and  left femoral to below-knee popliteal bypass 09/2019 with nonhealing left foot ulcerwho  presenting with L leg and food  pain. CT done for encephalopathy revealed a subacute R occipital infarct.   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    MRI  R occipital infarct w/ paretotemporal extension, chronic L occipital infarct   MRA head and neck bilateral siphon and right PCA stenosis  2D Echo EF 40%. No source of embolus   TEE Thurs 11/4 at 0800 to look for embolic source of stroke  LDL 77  HgbA1c 15.1  VTE prophylaxis - Heparin 5000 units sq tid   clopidogrel 75 mg daily prior to admission, now on clopidogrel 75 mg daily.  Avoid DAPT for now until rule out endocarditis.  Therapy recommendations:  SNF   Disposition:  pending   Septic shock   d/t serratia bacteremia   d/t BLE PAD w/ chronic LLE non-healing ucler in a DM   BP stable now  On cefepime  Hypotension Hx of hypertension  Now stable . Long-term BP goal normotensive  Hyperlipidemia  Home meds:  crestor 40 and zetia  LDL 77, goal < 70  No statin d/t elevated LFTs  Continue statin once LFTs normalize  Diabetes type II Uncontrolled  HgbA1c 15.1, goal < 7.0  CBGs  SSI  On Lantus  DB RN following  Other Stroke Risk Factors  Cigarette smoker, advised to stop smoking  ETOH use, advised to drink no more than 2 drink(s) a day  Hx Substance abuse - UDS / ETOH level not performed, chronic narcotic use.     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 For OP bx  Elevated LFTs  Chronic pancreatitis d/t ALCOHOL use  COPD   Possible underlying psychiatric d/o  Normocytic anemia of chronic dz  Hypokalemia  AKI on CKD stage 3a  Hospital day # 7  Rosalin Hawking, MD PhD Stroke Neurology 03/01/2020 6:18 PM    To contact Stroke Continuity provider, please refer to [email or web address] After hours, contact General Neurology

## 2020-03-01 NOTE — Progress Notes (Signed)
PT Cancellation Note  Patient Details Name: Morgan Beasley MRN: [id number] DOB: 09-29-1960   Cancelled Treatment:    Reason Eval/Treat Not Completed: Active bedrest order. New order for bedrest noted 10/31. Pt with c/o RUQ pain and poor appetite. Awaiting HIDA scan to r/o cholecystitis. MRI 10/31 revealed acute infarction of the lateral R occipital lobe with some parietotemporal extension and chronic L occipital lobe infarct. PT to follow and resume when medically ready.    Lorriane Shire 03/01/2020, 7:47 AM   Lorrin Goodell, PT  Office # [phone] Pager [phone]

## 2020-03-01 NOTE — Progress Notes (Signed)
Orthopedic Tech Progress Note Patient Details:  Austine Kelsay [facility] 20-Apr-1961 [id number]  Ortho Devices Type of Ortho Device: Darco shoe Ortho Device/Splint Location: Left Lower Extremity Ortho Device/Splint Interventions: Ordered, Other (comment) Left at pt bedside       Tammy Sours 03/01/2020, 6:43 PM

## 2020-03-01 NOTE — Progress Notes (Signed)
PROGRESS NOTE                                                                                                                                                                                                             Patient Demographics:    Morgan Beasley, is a 59 y.o. female, DOB - 08/21/1960, GEZ:[id number]  Outpatient Primary MD for the patient is Cullop, [name], NP    LOS - 7  Admit date - 02/23/2020    Chief Complaint  Patient presents with  . Wound Infection       Brief Narrative (HPI from H&P)  - 59 year old female, lives alone but her daughter checks on her regularly, ambulates with the help of a walker with seat, PMH of chronic combined systolic and diastolic CHF, ongoing tobacco abuse, stage II CKD, COPD not on home oxygen, OSA on nightly BiPAP, poorly controlled DM2 with peripheral neuropathy, gout, HLD, HTN, prior substance abuse (THC, cocaine), PAD s/p L SFA stenting 08/2019 and left femoral to below-knee popliteal bypass 09/2019 with nonhealing left foot ulcer, mild CAD presented initially to the [facility] ED on 10/25 due to leg and foot pain.  Admitted for sepsis/Serratia bacteremia, left lower extremity cellulitis complicating chronic nonhealing ulcer, persistent lactic acidosis and concern for critical left lower extremity ischemia.  Vascular surgery was consulted and she was transferred from [facility] long hospital to [facility] under my care on 02/25/2020.   Subjective:   Patient in bed, more alert today, denies any headache, no chest or abdominal pain, is mildly thirsty.  Does not feel she is weak anywhere.   Assessment  & Plan :    1.  Septic shock caused by Serratia bacteremia due to bilateral lower extremity PAD with chronic left lower extremity nonhealing ulcer in a diabetic patient.  She has been treated with IV fluids along with IV cefepime and oral Flagyl.  Vascular surgery following,  sepsis pathophysiology seems to have improved, continue supportive care with antibiotics and wound care, continue hydration with IV fluids and minimize narcotic use as much as possible.  Defer further management of PAD issues to vascular surgery.  2.  PAD s/p left femoropopliteal bypass in 10/19/2019, CAD.  On Plavix and statin for secondary prevention further assess #1 above.  3.   Incidental finding of reactive mediastinal hilar and  right supraclavicular adenopathy.  Request PCP to arrange for outpatient biopsy.  4.  Dyslipidemia.  On statin and Zetia.  Will resume once liver function improves.  5.  Chronic narcotic use.  Mildly somnolent, Narcan as needed, reduce narcotics and use with caution, patient and daughter updated on 02/25/2020.  6.  COPD and smoking.  Counseled to quit smoking, supportive care, no wheezing.  7.  Obesity with OSA.  BMI of 32, follow with PCP, use BiPAP whenever she sleeps day or night.  High risk for CO2 retention.  8.  Essential hypertension.  Hold beta-blocker due to hypotension, IV fluids.  Allow for permissive hypertension due to acute stroke as a #15 below.  9.  Chronic combined systolic and diastolic CHF.  Echo done few months ago showing a EF of 40% with global hypokinesis.  Continue Plavix, statin and beta-blocker for secondary prevention.  10.  AKI with CKD stage IIIa.  Baseline creatinine around 1.4, AKI worse due to a combination of IV contrast for CTA, dehydration and hypotension, hold beta-blocker, continue hydration with IV fluids work-up suggestive of prerenal dehydration, improving with IV fluids.  11.  Anemia of chronic disease.  Mild drop due to heme dilution.  Monitor.  12. Poor IV access - PICC needed for IV  meds and ABX, CKD 3 A - benefits outweigh the risks.  13.  Elevated transaminases 02/27/2020.  Could be shock liver due to hypotension, she is completely pain and symptom-free, right upper quadrant ultrasound showed some nonspecific  changes, seen by general surgery, also INR is high for which she will get vitamin K on 02/28/2020, acute hepatitis panel negative, right upper quadrant abdominal ultrasound nonacute, right upper quadrant liver Doppler stable, questionable mild early Nash.  Repeat vitamin K on 03/01/2020.  GI following.  HIDA pending.  14. Hypokalemia , Hypomagnesemia - replaced again we will continue to monitor.  15.  Acute right occipital infarct causing encephalopathy, mild left arm weakness, neuro following, full stroke work-up underway, will also order TEE as requested by neurology, family poor outpatient glycemic control with A1c of 15, LDL is above goal hence will resume statin once liver function improves, continue Plavix for now, counseled to quit smoking.  47. DM 2, poor outpatient control due to hyperglycemia.  With A1c of 15, on Lantus and sliding scale monitor and adjust.  Lab Results  Component Value Date   HGBA1C 15.1 (H) 02/23/2020   CBG (last 3)  Recent Labs    03/01/20 0158 03/01/20 0458 03/01/20 0601  GLUCAP 135* 68* 100*   Lipid Panel     Component Value Date/Time   CHOL 219 (H) 02/09/2020 0825   TRIG 175 (H) 02/09/2020 0825   HDL 53 02/09/2020 0825   CHOLHDL 4.1 02/09/2020 0825   CHOLHDL 3.8 10/11/2019 1101   VLDL 22 10/11/2019 1101   LDLCALC 135 (H) 02/09/2020 0825   LABVLDL 31 02/09/2020 0825   Lab Results  Component Value Date   INR 2.7 (H) 03/01/2020   INR 2.1 (H) 02/29/2020   INR 3.2 (H) 02/28/2020       Condition - Extremely Guarded  Family Communication  [name] - [phone] - on 02/25/20, bedside on 02/29/2020  Code Status :  Full  Consults  :  VVS - Dr. Oneida Alar, general surgery, GI, Neuro  Procedures  :    TEE  HIDA  PICC line placement 02/25/2020.    MRI/A -  Acute infarction of the lateral right occipital lobe with some  parietotemporal extension.  Chronic left occipital infarct. Mild chronic microvascular ischemic changes.  No large  vessel occlusion or hemodynamically significant stenosis.  Leg [place] - No DVT  RUQ [place] - Mild distention of the gallbladder, gallbladder sludge, and pericholecystic fluid identified. Reportedly positive sonographic Murphy sign. Together, the findings are suspicious for changes of acute, calculus cholecystitis.  Liver Doppler -   1. Patent hepatic vasculature with normal directional flow. 2. Suspected mild nodularity hepatic contour as could be seen in the setting of early cirrhotic change without stigmata of portal venous hypertension, specifically, no evidence of splenomegaly or intra-abdominal ascites. No discrete hepatic lesions.   Renal US - 1. No hydronephrosis. 2. Mildly echogenic normal size kidneys, compatible with reported history of nonspecific acute renal parenchymal disease. 3. Normal bladder. 4. Incidentally noted nonspecific diffuse gallbladder wall thickening. If there is clinical concern for acute cholecystitis, dedicated right upper quadrant abdominal sonogram could be obtained for further evaluation  CTA -  IMPRESSION: 1. Patent left lower extremity femoral to below knee popliteal artery bypass graft. Evaluation of the left tibial vasculature is limited by calcifications, however there appears to be a 2 vessel runoff to the left ankle via the anterior tibial and posterior tibial arteries. 2. Progressive peripheral vascular disease involving the right lower extremity. There is now complete occlusion of the mid to distal right superficial femoral artery, new since prior study. There are now appears to be a single vessel runoff to the right foot via the anterior tibial artery. In June, there was a 3 vessel runoff to the right foot. 3. Nonspecific, asymmetric bilateral lower extremity edema (left worse than right). This is of unknown clinical significance. This study cannot adequately exclude a DVT. If there is clinical suspicion for a DVT, follow-up with ultrasound is recommended. 4. No  evidence for an acute pulmonary embolism. No evidence for thoracic aortic dissection or aneurysm. 5. Cardiomegaly with small to moderate-sized bilateral pleural effusions, right worse than left. 6. Persistent mediastinal and hilar adenopathy with worsening right supraclavicular adenopathy. Findings may be reactive or due to a lymphoproliferative disorder or metastatic disease. Follow-up is recommended. Of note, the right supraclavicular lymph node is amenable to percutaneous ultrasound-guided biopsy if clinically indicated. 7. Hepatomegaly with likely underlying hepatic steatosis. 8.  Aortic Atherosclerosis   PUD Prophylaxis : PPI  Disposition Plan  :    Status is: Inpatient  Remains inpatient appropriate because:Inpatient level of care appropriate due to severity of illness   Dispo: The patient is from: Home              Anticipated d/c is to: Home              Anticipated d/c date is: > 3 days              Patient currently is medically stable to d/c.   DVT Prophylaxis  :  Heparin    Lab Results  Component Value Date   PLT 350 03/01/2020    Diet :  Diet Order            Diet NPO time specified  Diet effective now                  Inpatient Medications  Scheduled Meds: . Chlorhexidine Gluconate Cloth  6 each Topical Daily  . clopidogrel  75 mg Oral Daily  . collagenase   Topical Daily  . fluticasone furoate-vilanterol  1 puff Inhalation Daily  . heparin injection (subcutaneous)  5,000  Units Subcutaneous Q8H  . insulin aspart  0-9 Units Subcutaneous Q6H  . insulin glargine  20 Units Subcutaneous Daily  . lipase/protease/amylase  72,000 Units Oral TID WC  . pantoprazole  40 mg Oral Daily   Continuous Infusions: . calcium gluconate    . ceFEPime (MAXIPIME) IV 2 g (03/01/20 0458)  . lactated ringers Stopped (02/26/20 1041)  . lactated ringers    . magnesium sulfate bolus IVPB    . potassium chloride     PRN Meds:.acetaminophen **OR** [DISCONTINUED] acetaminophen,  dextrose, dextrose, lipase/protease/amylase, naLOXone (NARCAN)  injection, [DISCONTINUED] ondansetron **OR** ondansetron (ZOFRAN) IV, polyethylene glycol  Antibiotics  :    Anti-infectives (From admission, onward)   Start     Dose/Rate Route Frequency Ordered Stop   02/28/20 0546  ceFEPIme (MAXIPIME) 2 g in sodium chloride 0.9 % 100 mL IVPB        2 g 200 mL/hr over 30 Minutes Intravenous Every 24 hours 02/27/20 1041     02/25/20 1400  metroNIDAZOLE (FLAGYL) tablet 500 mg  Status:  Discontinued        500 mg Oral Every 8 hours 02/25/20 1025 02/29/20 0911   02/24/20 1800  ceFEPIme (MAXIPIME) 2 g in sodium chloride 0.9 % 100 mL IVPB  Status:  Discontinued        2 g 200 mL/hr over 30 Minutes Intravenous Every 12 hours 02/24/20 0732 02/27/20 1041   02/24/20 0600  ceFEPIme (MAXIPIME) 2 g in sodium chloride 0.9 % 100 mL IVPB  Status:  Discontinued        2 g 200 mL/hr over 30 Minutes Intravenous Every 8 hours 02/24/20 0440 02/24/20 0732   02/23/20 1015  vancomycin (VANCOREADY) IVPB 1750 mg/350 mL        1,750 mg 175 mL/hr over 120 Minutes Intravenous  Once 02/23/20 1007 02/23/20 1347   02/23/20 1000  cefTRIAXone (ROCEPHIN) 2 g in sodium chloride 0.9 % 100 mL IVPB  Status:  Discontinued        2 g 200 mL/hr over 30 Minutes Intravenous Every 24 hours 02/23/20 0952 02/24/20 0440   02/23/20 1000  metroNIDAZOLE (FLAGYL) IVPB 500 mg  Status:  Discontinued        500 mg 100 mL/hr over 60 Minutes Intravenous Every 8 hours 02/23/20 0952 02/25/20 1025       Time Spent in minutes  30   Lala Lund M.D on 03/01/2020 at 9:22 AM  To page go to [email or web address] - password [facility]  [facility] -  Office  [phone]    See all Orders from today for further details    Objective:   Vitals:   02/29/20 2307 03/01/20 0311 03/01/20 0723 03/01/20 0727  BP: (!) 141/83 (!) 147/77  (!) 144/84  Pulse: 89 92 87 89  Resp: 20 20 (!) 22 17  Temp: 98.2 F (36.8 C) 98.4 F (36.9 C)  98.2 F (36.8  C)  TempSrc: Oral Oral  Oral  SpO2: 95% 91% 95% 94%  Weight:      Height:        Wt Readings from Last 3 Encounters:  02/23/20 90.3 kg  01/26/20 90.7 kg  01/16/20 93.9 kg     Intake/Output Summary (Last 24 hours) at 03/01/2020 8295 Last data filed at 03/01/2020 0313 Gross per 24 hour  Intake --  Output 1800 ml  Net -1800 ml     Physical Exam  Awake more alert, minimally confused, moving all 4 extremities but slight relative weakness in  the left arm Supple Neck,No JVD, No cervical lymphadenopathy appriciated.  Symmetrical Chest wall movement, Good air movement bilaterally, CTAB RRR,No Gallops, Rubs or new Murmurs, No Parasternal Heave +ve B.Sounds, Abd Soft, No tenderness, No organomegaly appriciated, No rebound - guarding or rigidity.     lower extremity wounds as below.          Data Review:    CBC Recent Labs  Lab 02/26/20 0343 02/27/20 0500 02/28/20 0500 02/29/20 0455 03/01/20 0500  WBC 8.0 7.7 7.3 7.7 6.8  HGB 10.5* 10.6* 8.5* 9.9* 8.5*  HCT 34.7* 35.0* 28.2* 32.2* 28.2*  PLT 357 371 319 378 350  MCV 83.2 82.2 82.9 81.5 81.0  MCH 25.2* 24.9* 25.0* 25.1* 24.4*  MCHC 30.3 30.3 30.1 30.7 30.1  RDW 18.6* 18.7* 18.8* 18.8* 19.1*  LYMPHSABS 1.4 1.6 1.2 0.9 1.2  MONOABS 0.7 0.8 0.9 0.9 0.8  EOSABS 0.1 0.1 0.1 0.1 0.1  BASOSABS 0.0 0.0 0.0 0.0 0.0    Recent Labs  Lab  0000 02/23/20 0952 02/23/20 1149 02/23/20 1152 02/23/20 1432 02/23/20 1453 02/23/20 1934 02/24/20 0355 02/24/20 0355 02/24/20 1901 02/25/20 0254 02/26/20 0148 02/26/20 0343 02/26/20 0344 02/27/20 0500 02/28/20 0500 02/29/20 0455 03/01/20 0500  NA  --    < >  --   --   --   --   --  136   < >  --  134*  --  134*  --  133* 134* 134* 135  K  --    < >  --   --   --   --   --  3.2*   < >  --  4.1  --  5.4*  --  4.1 2.9* 4.7 3.3*  CL  --    < >  --   --   --   --   --  104   < >  --  104  --  103  --  101 122* 104 120*  CO2  --    < >  --   --   --   --   --  24   < >  --   21*  --  22  --  22 18* 23 20*  GLUCOSE  --    < >  --   --   --   --   --  117*   < >  --  136*  --  120*  --  164* 121* 80 64*  BUN  --    < >  --   --   --   --   --  14   < >  --  22*  --  31*  --  39* 33* 39* 27*  CREATININE  --    < >  --   --   --   --   --  1.38*   < >  --  1.80*  --  2.52*  --  2.88* 2.38* 2.73* 1.87*  CALCIUM  --    < >  --   --   --   --   --  7.4*   < >  --  7.6*  --  7.8*  --  7.7* 5.8* 7.6* 6.4*  AST   < >  --   --   --   --   --   --   --   --   --   --   --  66*  --  920* 829* 758* 266*  ALT   < >  --   --   --   --   --   --   --   --   --   --   --  24  --  181* 235* 341* 213*  ALKPHOS   < >  --   --   --   --   --   --   --   --   --   --   --  137*  --  184* 181* 290* 214*  BILITOT   < >  --   --   --   --   --   --   --   --   --   --   --  0.7  --  0.4 0.6 0.6 0.6  ALBUMIN   < >  --   --   --   --   --   --   --   --   --   --   --  1.6*  --  1.6* 1.1* 1.4* 1.2*  MG  --   --   --   --   --   --   --   --   --   --   --    < > 1.5*  --  2.0 1.4* 2.3 1.6*  PROCALCITON  --   --  <0.10  --   --   --   --  0.67  --   --  0.77  --   --   --   --   --   --   --   LATICACIDVEN  --   --   --    < > 3.6*  --  >11.0*  --   --  2.1* 0.9  --   --  1.5  --   --   --   --   INR  --    < >  --   --   --   --   --  1.4*  --   --   --   --  1.7*  --   --  3.2* 2.1* 2.7*  HGBA1C  --   --   --   --   --  15.1*  --   --   --   --   --   --   --   --   --   --   --   --   BNP   < >  --   --   --   --   --   --   --   --   --   --   --  658.8*  --  699.1* 510.0* 635.9* 808.8*   < > = values in this interval not displayed.    ------------------------------------------------------------------------------------------------------------------ No results for input(s): CHOL, HDL, LDLCALC, TRIG, CHOLHDL, LDLDIRECT in the last 72 hours.  Lab Results  Component Value Date   HGBA1C 15.1 (H) 02/23/2020    ------------------------------------------------------------------------------------------------------------------ No results for input(s): TSH, T4TOTAL, T3FREE, THYROIDAB in the last 72 hours.  Invalid input(s): FREET3  Cardiac Enzymes No results for input(s): CKMB, TROPONINI, MYOGLOBIN in the last 168 hours.  Invalid input(s): CK ------------------------------------------------------------------------------------------------------------------    Component Value Date/Time   BNP 808.8 (H) 03/01/2020 0500    Micro Results Recent Results (from the past 240 hour(s))  Blood Culture (  routine x 2)     Status: Abnormal   Collection Time: 02/23/20  9:52 AM   Specimen: BLOOD  Result Value Ref Range Status   Specimen Description   Final    BLOOD RIGHT ANTECUBITAL Performed at [facility] [address] [place], [place] [address]    Special Requests   Final    BOTTLES DRAWN AEROBIC AND ANAEROBIC Blood Culture adequate volume Performed at [facility] [address], [place], [place] [address]    Culture  Setup Time   Final    GRAM NEGATIVE RODS IN BOTH AEROBIC AND ANAEROBIC BOTTLES CRITICAL RESULT CALLED TO, READ BACK BY AND VERIFIED WITH: Irwin Brakeman [id number] 02/24/2020 Mena Goes Performed at [facility], [place] [address], [place], [place] [address]    Culture SERRATIA MARCESCENS (A)  Final   Report Status 02/26/2020 FINAL  Final   Organism ID, Bacteria SERRATIA MARCESCENS  Final      Susceptibility   Serratia marcescens - MIC*    CEFAZOLIN >=64 RESISTANT Resistant     CEFEPIME <=0.12 SENSITIVE Sensitive     CEFTAZIDIME <=1 SENSITIVE Sensitive     CEFTRIAXONE <=0.25 SENSITIVE Sensitive     CIPROFLOXACIN <=0.25 SENSITIVE Sensitive     GENTAMICIN <=1 SENSITIVE Sensitive     TRIMETH/SULFA <=20 SENSITIVE Sensitive     * SERRATIA MARCESCENS  Blood Culture ID Panel (Reflexed)     Status: Abnormal   Collection Time: 02/23/20  9:52  AM  Result Value Ref Range Status   Enterococcus faecalis NOT DETECTED NOT DETECTED Final   Enterococcus Faecium NOT DETECTED NOT DETECTED Final   Listeria monocytogenes NOT DETECTED NOT DETECTED Final   Staphylococcus species NOT DETECTED NOT DETECTED Final   Staphylococcus aureus (BCID) NOT DETECTED NOT DETECTED Final   Staphylococcus epidermidis NOT DETECTED NOT DETECTED Final   Staphylococcus lugdunensis NOT DETECTED NOT DETECTED Final   Streptococcus species NOT DETECTED NOT DETECTED Final   Streptococcus agalactiae NOT DETECTED NOT DETECTED Final   Streptococcus pneumoniae NOT DETECTED NOT DETECTED Final   Streptococcus pyogenes NOT DETECTED NOT DETECTED Final   A.calcoaceticus-baumannii NOT DETECTED NOT DETECTED Final   Bacteroides fragilis NOT DETECTED NOT DETECTED Final   Enterobacterales DETECTED (A) NOT DETECTED Final    Comment: Enterobacterales represent a large order of gram negative bacteria, not a single organism. CRITICAL RESULT CALLED TO, READ BACK BY AND VERIFIED WITH: M. LILLISTON,PHARMD [id number] 02/24/2020 T. TYSOR    Enterobacter cloacae complex NOT DETECTED NOT DETECTED Final   Escherichia coli NOT DETECTED NOT DETECTED Final   Klebsiella aerogenes NOT DETECTED NOT DETECTED Final   Klebsiella oxytoca NOT DETECTED NOT DETECTED Final   Klebsiella pneumoniae NOT DETECTED NOT DETECTED Final   Proteus species NOT DETECTED NOT DETECTED Final   Salmonella species NOT DETECTED NOT DETECTED Final   Serratia marcescens DETECTED (A) NOT DETECTED Final    Comment: CRITICAL RESULT CALLED TO, READ BACK BY AND VERIFIED WITH: M. LILLISTON,PHARMD 0257 02/24/2020 T. TYSOR    Haemophilus influenzae NOT DETECTED NOT DETECTED Final   Neisseria meningitidis NOT DETECTED NOT DETECTED Final   Pseudomonas aeruginosa NOT DETECTED NOT DETECTED Final   Stenotrophomonas maltophilia NOT DETECTED NOT DETECTED Final   Candida albicans NOT DETECTED NOT DETECTED Final   Candida auris NOT  DETECTED NOT DETECTED Final   Candida glabrata NOT DETECTED NOT DETECTED Final   Candida krusei NOT DETECTED NOT DETECTED Final   Candida parapsilosis NOT DETECTED NOT DETECTED Final  Candida tropicalis NOT DETECTED NOT DETECTED Final   Cryptococcus neoformans/gattii NOT DETECTED NOT DETECTED Final   CTX-M ESBL NOT DETECTED NOT DETECTED Final   Carbapenem resistance IMP NOT DETECTED NOT DETECTED Final   Carbapenem resistance KPC NOT DETECTED NOT DETECTED Final   Carbapenem resistance NDM NOT DETECTED NOT DETECTED Final   Carbapenem resist OXA 48 LIKE NOT DETECTED NOT DETECTED Final   Carbapenem resistance VIM NOT DETECTED NOT DETECTED Final    Comment: Performed at [facility], [place] [address], [place], [place] [address]  Respiratory Panel by RT PCR (Flu A&B, Covid) - Nasopharyngeal Swab     Status: None   Collection Time: 02/23/20  9:55 AM   Specimen: Nasopharyngeal Swab  Result Value Ref Range Status   SARS Coronavirus 2 by RT PCR NEGATIVE NEGATIVE Final    Comment: (NOTE) SARS-CoV-2 target nucleic acids are NOT DETECTED.  The SARS-CoV-2 RNA is generally detectable in upper respiratoy specimens during the acute phase of infection. The lowest concentration of SARS-CoV-2 viral copies this assay can detect is 131 copies/mL. A negative result does not preclude SARS-Cov-2 infection and should not be used as the sole basis for treatment or other patient management decisions. A negative result may occur with  improper specimen collection/handling, submission of specimen other than nasopharyngeal swab, presence of viral mutation(s) within the areas targeted by this assay, and inadequate number of viral copies (<131 copies/mL). A negative result must be combined with clinical observations, patient history, and epidemiological information. The expected result is Negative.  Fact Sheet for Patients:  PinkCheek.be  Fact Sheet for Healthcare Providers:   GravelBags.it  This test is no t yet approved or cleared by the [place] FDA and  has been authorized for detection and/or diagnosis of SARS-CoV-2 by FDA under an Emergency Use Authorization (EUA). This EUA will remain  in effect (meaning this test can be used) for the duration of the COVID-19 declaration under Section 564(b)(1) of the Act, 21 U.S.C. section 360bbb-3(b)(1), unless the authorization is terminated or revoked sooner.     Influenza A by PCR NEGATIVE NEGATIVE Final   Influenza B by PCR NEGATIVE NEGATIVE Final    Comment: (NOTE) The Xpert Xpress SARS-CoV-2/FLU/RSV assay is intended as an aid in  the diagnosis of influenza from Nasopharyngeal swab specimens and  should not be used as a sole basis for treatment. Nasal washings and  aspirates are unacceptable for Xpert Xpress SARS-CoV-2/FLU/RSV  testing.  Fact Sheet for Patients: PinkCheek.be  Fact Sheet for Healthcare Providers: GravelBags.it  This test is not yet approved or cleared by the [place] FDA and  has been authorized for detection and/or diagnosis of SARS-CoV-2 by  FDA under an Emergency Use Authorization (EUA). This EUA will remain  in effect (meaning this test can be used) for the duration of the  Covid-19 declaration under Section 564(b)(1) of the Act, 21  U.S.C. section 360bbb-3(b)(1), unless the authorization is  terminated or revoked. Performed at [facility], [place] [address], [place], [place] [address]   Blood Culture (routine x 2)     Status: None   Collection Time: 02/23/20  9:57 AM   Specimen: BLOOD RIGHT FOREARM  Result Value Ref Range Status   Specimen Description   Final    BLOOD RIGHT FOREARM Performed at [facility], [place] [address], [place], [place] [address]    Special Requests   Final    BOTTLES DRAWN AEROBIC AND ANAEROBIC Blood Culture results  may not be optimal  due to an inadequate volume of blood received in culture bottles Performed at [facility] [address] [place], [place] [address]    Culture   Final    NO GROWTH 5 DAYS Performed at [facility], [place] [address], [place], [place] [address]    Report Status 02/28/2020 FINAL  Final  Urine culture     Status: Abnormal   Collection Time: 02/23/20 12:09 PM   Specimen: In/Out Cath Urine  Result Value Ref Range Status   Specimen Description   Final    IN/OUT CATH URINE Performed at [facility] [address] [place], [place] [address]    Special Requests   Final    NONE Performed at [facility], [place] [address], [place], [place] [address]    Culture (A)  Final    <10,000 COLONIES/mL INSIGNIFICANT GROWTH Performed at [facility] [address], [place], [place] [address]    Report Status 02/24/2020 FINAL  Final  MRSA PCR Screening     Status: None   Collection Time: 02/25/20  7:26 AM   Specimen: Nasal Mucosa; Nasopharyngeal  Result Value Ref Range Status   MRSA by PCR NEGATIVE NEGATIVE Final    Comment:        The GeneXpert MRSA Assay (FDA approved for NASAL specimens only), is one component of a comprehensive MRSA colonization surveillance program. It is not intended to diagnose MRSA infection nor to guide or monitor treatment for MRSA infections. Performed at [facility], [place] [address] [place], [place] [address]     Radiology Reports CT HEAD WO CONTRAST  Result Date: 02/29/2020 CLINICAL DATA:  Delirium. EXAM: CT HEAD WITHOUT CONTRAST TECHNIQUE: Contiguous axial images were obtained from the base of the skull through the vertex without intravenous contrast. COMPARISON:  December 17, 2016 FINDINGS: Brain: No subdural, epidural, or subarachnoid hemorrhage. Cerebellum, brainstem, and basal cisterns are normal. Ventricles and sulci are unremarkable. No mass effect or midline shift. Decreased  attenuation involving the right temporal occipital cortex. There is also focal low-attenuation in the left occipital lobe on series 3, image 54. No other sites of infarct or ischemia identified. Vascular: Calcified atherosclerosis in the intracranial carotids. Skull: Normal. Negative for fracture or focal lesion. Sinuses/Orbits: No acute finding. Other: None. IMPRESSION: 1. Decreased attenuation in the right temporal occipital cortex and in the left occipital cortex. The findings are consistent with age-indeterminate infarcts but are favored to be subacute. MRI could better assess. The presence of probable subacute infarcts in 2 vascular territories should raise the possibility of an embolic cause. 2. No other acute abnormalities. 3. No other acute abnormalities. These results will be called to the ordering clinician or representative by the Radiologist Assistant, and communication documented in the PACS or [employer]. Electronically Signed   By: Dorise Bullion III M.D   On: 02/29/2020 10:33   MR ANGIO HEAD WO CONTRAST  Result Date: 02/29/2020 CLINICAL DATA:  Abnormal CT EXAM: MRI HEAD WITHOUT CONTRAST MRA HEAD WITHOUT CONTRAST MRA NECK WITHOUT CONTRAST TECHNIQUE: Multiplanar, multiecho pulse sequences of the brain and surrounding structures were obtained without intravenous contrast. Angiographic images of the Circle of Willis were obtained using MRA technique without intravenous contrast. Angiographic images of the neck were obtained using MRA technique without intravenous contrast. Carotid stenosis measurements (when applicable) are obtained utilizing NASCET criteria, using the distal internal carotid diameter as the denominator. COMPARISON:  None. FINDINGS:  MRI HEAD Brain: There is restricted diffusion in the lateral right occipital lobe with some parietotemporal extension reflecting acute infarction. Chronic left occipital infarct. Few additional patchy foci of T2 hyperintensity in the  supratentorial and pontine white matter are nonspecific but probably reflect mild chronic microvascular ischemic changes. Foci of susceptibility are present in the posterior left lentiform nucleus, left thalamus, and midline cerebellum likely reflecting chronic microhemorrhages. There is no intracranial mass or significant mass effect. There is no hydrocephalus or extra-axial fluid collection. Vascular: Major vessel flow voids at the skull base are preserved. Skull and upper cervical spine: Normal marrow signal is preserved. Sinuses/Orbits: Paranasal sinuses are aerated. Orbits are unremarkable. Other: Sella is unremarkable.  Mastoid air cells are clear. MRA HEAD Intracranial internal carotid arteries are patent. Middle and anterior cerebral arteries are patent. Included intracranial vertebral arteries, basilar artery, posterior cerebral arteries are patent. There is no significant stenosis or aneurysm. MRA NECK Included portions of the common carotid arteries are patent. Internal and external carotid arteries are patent. There is no hemodynamically significant stenosis at the ICA origins by NASCET criteria. Included extracranial vertebral arteries are patent and codominant. IMPRESSION: Acute infarction of the lateral right occipital lobe with some parietotemporal extension. Chronic left occipital infarct. Mild chronic microvascular ischemic changes. No large vessel occlusion or hemodynamically significant stenosis. Electronically Signed   By: Macy Mis M.D.   On: 02/29/2020 13:40   MR ANGIO NECK WO CONTRAST  Result Date: 02/29/2020 CLINICAL DATA:  Abnormal CT EXAM: MRI HEAD WITHOUT CONTRAST MRA HEAD WITHOUT CONTRAST MRA NECK WITHOUT CONTRAST TECHNIQUE: Multiplanar, multiecho pulse sequences of the brain and surrounding structures were obtained without intravenous contrast. Angiographic images of the Circle of Willis were obtained using MRA technique without intravenous contrast. Angiographic images of the  neck were obtained using MRA technique without intravenous contrast. Carotid stenosis measurements (when applicable) are obtained utilizing NASCET criteria, using the distal internal carotid diameter as the denominator. COMPARISON:  None. FINDINGS: MRI HEAD Brain: There is restricted diffusion in the lateral right occipital lobe with some parietotemporal extension reflecting acute infarction. Chronic left occipital infarct. Few additional patchy foci of T2 hyperintensity in the supratentorial and pontine white matter are nonspecific but probably reflect mild chronic microvascular ischemic changes. Foci of susceptibility are present in the posterior left lentiform nucleus, left thalamus, and midline cerebellum likely reflecting chronic microhemorrhages. There is no intracranial mass or significant mass effect. There is no hydrocephalus or extra-axial fluid collection. Vascular: Major vessel flow voids at the skull base are preserved. Skull and upper cervical spine: Normal marrow signal is preserved. Sinuses/Orbits: Paranasal sinuses are aerated. Orbits are unremarkable. Other: Sella is unremarkable.  Mastoid air cells are clear. MRA HEAD Intracranial internal carotid arteries are patent. Middle and anterior cerebral arteries are patent. Included intracranial vertebral arteries, basilar artery, posterior cerebral arteries are patent. There is no significant stenosis or aneurysm. MRA NECK Included portions of the common carotid arteries are patent. Internal and external carotid arteries are patent. There is no hemodynamically significant stenosis at the ICA origins by NASCET criteria. Included extracranial vertebral arteries are patent and codominant. IMPRESSION: Acute infarction of the lateral right occipital lobe with some parietotemporal extension. Chronic left occipital infarct. Mild chronic microvascular ischemic changes. No large vessel occlusion or hemodynamically significant stenosis. Electronically Signed    By: Macy Mis M.D.   On: 02/29/2020 13:40   MR BRAIN WO CONTRAST  Result Date: 02/29/2020 CLINICAL DATA:  Abnormal CT EXAM: MRI HEAD WITHOUT CONTRAST MRA  HEAD WITHOUT CONTRAST MRA NECK WITHOUT CONTRAST TECHNIQUE: Multiplanar, multiecho pulse sequences of the brain and surrounding structures were obtained without intravenous contrast. Angiographic images of the Circle of Willis were obtained using MRA technique without intravenous contrast. Angiographic images of the neck were obtained using MRA technique without intravenous contrast. Carotid stenosis measurements (when applicable) are obtained utilizing NASCET criteria, using the distal internal carotid diameter as the denominator. COMPARISON:  None. FINDINGS: MRI HEAD Brain: There is restricted diffusion in the lateral right occipital lobe with some parietotemporal extension reflecting acute infarction. Chronic left occipital infarct. Few additional patchy foci of T2 hyperintensity in the supratentorial and pontine white matter are nonspecific but probably reflect mild chronic microvascular ischemic changes. Foci of susceptibility are present in the posterior left lentiform nucleus, left thalamus, and midline cerebellum likely reflecting chronic microhemorrhages. There is no intracranial mass or significant mass effect. There is no hydrocephalus or extra-axial fluid collection. Vascular: Major vessel flow voids at the skull base are preserved. Skull and upper cervical spine: Normal marrow signal is preserved. Sinuses/Orbits: Paranasal sinuses are aerated. Orbits are unremarkable. Other: Sella is unremarkable.  Mastoid air cells are clear. MRA HEAD Intracranial internal carotid arteries are patent. Middle and anterior cerebral arteries are patent. Included intracranial vertebral arteries, basilar artery, posterior cerebral arteries are patent. There is no significant stenosis or aneurysm. MRA NECK Included portions of the common carotid arteries are  patent. Internal and external carotid arteries are patent. There is no hemodynamically significant stenosis at the ICA origins by NASCET criteria. Included extracranial vertebral arteries are patent and codominant. IMPRESSION: Acute infarction of the lateral right occipital lobe with some parietotemporal extension. Chronic left occipital infarct. Mild chronic microvascular ischemic changes. No large vessel occlusion or hemodynamically significant stenosis. Electronically Signed   By: Macy Mis M.D.   On: 02/29/2020 13:40   CT ANGIO AO+BIFEM W & OR WO CONTRAST  Result Date: 02/24/2020 CLINICAL DATA:  Chest pain.  Peripheral vascular disease. EXAM: CT ANGIOGRAPHY OF ABDOMINAL AORTA WITH ILIOFEMORAL RUNOFF CT ANGIOGRAPHY OF ABDOMINAL AORTA WITH ILIOFEMORAL RUNOFF TECHNIQUE: Multidetector CT imaging of the abdomen, pelvis and lower extremities was performed using the standard protocol during bolus administration of intravenous contrast. Multiplanar CT image reconstructions and MIPs were obtained to evaluate the vascular anatomy. Multidetector CT imaging of the abdomen, pelvis and lower extremities was performed using the standard protocol during bolus administration of intravenous contrast. Multiplanar CT image reconstructions and MIPs were obtained to evaluate the vascular anatomy. CONTRAST:  175mL OMNIPAQUE IOHEXOL 350 MG/ML SOLN COMPARISON:  CT dated October 09, 2019. CT PE study dated 02/25/2019 FINDINGS: CHEST Cardiovascular: There is no evidence for large centrally located pulmonary embolism. Detection of smaller pulmonary emboli is limited by technique. There is no evidence for a thoracic aortic dissection or aneurysm. The heart size is enlarged. There is no significant pericardial effusion. There are mild coronary artery calcifications. Mediastinum/Nodes: --there are enlarged mediastinal lymph nodes. For example there is a pretracheal lymph node measuring approximately 1.8 x 2.9 cm (axial series 7, image  40). This is relatively similar to prior study. --mild hilar adenopathy is noted. -- No axillary lymphadenopathy. --there is significant right supraclavicular adenopathy which has progressed since the prior study. The dominant lymph node measures 1.2 by 3 point 4 cm (axial series 7, image 15). -- Normal thyroid gland where visualized. -  Unremarkable esophagus. Lungs/Pleura: There is a moderate to large right-sided pleural effusion. There is a small left-sided pleural effusion. There is bibasilar atelectasis. There  is no pneumothorax. Musculoskeletal: No chest wall abnormality. No bony spinal canal stenosis. Review of the MIP images confirms the above findings. VASCULAR Aorta: There are atherosclerotic changes of the abdominal aorta without evidence for an aneurysm. Celiac: Patent without evidence of aneurysm, dissection, vasculitis or significant stenosis. SMA: Patent without evidence of aneurysm, dissection, vasculitis or significant stenosis. Renals: Both renal arteries are patent without evidence of aneurysm, dissection, vasculitis, fibromuscular dysplasia or significant stenosis. IMA: Patent without evidence of aneurysm, dissection, vasculitis or significant stenosis. RIGHT Lower Extremity Inflow: Common, internal and external iliac arteries are patent without evidence of aneurysm, dissection, vasculitis or significant stenosis. Outflow: The right common femoral artery is patent. The right SFA is occluded proximally. There is near immediate reconstitution. There are tandem high-grade areas of stenosis throughout the right SFA. There is new complete occlusion of the mid to distal right SFA (axial series 7, image 247). Evaluation of the right popliteal artery is limited, however there appears to be high-grade stenosis throughout the vessel. Runoff: There is a high-grade stenosis of the proximal right anterior tibial artery. There appears to be a single vessel runoff to the right lower extremity via the anterior  tibial artery. LEFT Lower Extremity Inflow: Common, internal and external iliac arteries are patent without evidence of aneurysm, dissection, vasculitis or significant stenosis. Outflow: The left CFA is widely patent. The proximal left SFA is entirely occluded. There is a new left femoral to below knee popliteal artery bypass graft. The graft is widely patent throughout its course. The profunda femoris artery is patent. Runoff: Heavy calcifications limit evaluation of the tibial vasculature. However there appears to be a 2 vessel runoff via the anterior tibial and posterior tibial arteries. The peroneal artery is occluded. Veins: The veins are not well evaluated on this study. Review of the MIP images confirms the above findings. NON-VASCULAR Hepatobiliary: There is hepatomegaly with likely underlying hepatic steatosis. There appears to be gallbladder wall thickening with pericholecystic free fluid.There is no biliary ductal dilation. Pancreas: Normal contours without ductal dilatation. No peripancreatic fluid collection. Spleen: Unremarkable. Adrenals/Urinary Tract: --Adrenal glands: Unremarkable. --Right kidney/ureter: No hydronephrosis or radiopaque kidney stones. --Left kidney/ureter: No hydronephrosis or radiopaque kidney stones. --Urinary bladder: Unremarkable. Stomach/Bowel: --Stomach/Duodenum: No hiatal hernia or other gastric abnormality. Normal duodenal course and caliber. --Small bowel: Unremarkable. --Colon: Unremarkable. --Appendix: Normal. Lymphatic: --No retroperitoneal lymphadenopathy. --No mesenteric lymphadenopathy. --there is mild bilateral inguinal adenopathy, left worse than right. Reproductive: Unremarkable Other: No ascites or free air. The abdominal wall is normal. Musculoskeletal. There are postsurgical changes of the left inguinal region. There is bilateral nonspecific lower extremity edema, left worse than right. There are small bilateral suprapatellar joint effusions. IMPRESSION: 1.  Patent left lower extremity femoral to below knee popliteal artery bypass graft. Evaluation of the left tibial vasculature is limited by calcifications, however there appears to be a 2 vessel runoff to the left ankle via the anterior tibial and posterior tibial arteries. 2. Progressive peripheral vascular disease involving the right lower extremity. There is now complete occlusion of the mid to distal right superficial femoral artery, new since prior study. There are now appears to be a single vessel runoff to the right foot via the anterior tibial artery. In June, there was a 3 vessel runoff to the right foot. 3. Nonspecific, asymmetric bilateral lower extremity edema (left worse than right). This is of unknown clinical significance. This study cannot adequately exclude a DVT. If there is clinical suspicion for a DVT, follow-up with ultrasound is recommended.  4. No evidence for an acute pulmonary embolism. No evidence for thoracic aortic dissection or aneurysm. 5. Cardiomegaly with small to moderate-sized bilateral pleural effusions, right worse than left. 6. Persistent mediastinal and hilar adenopathy with worsening right supraclavicular adenopathy. Findings may be reactive or due to a lymphoproliferative disorder or metastatic disease. Follow-up is recommended. Of note, the right supraclavicular lymph node is amenable to percutaneous ultrasound-guided biopsy if clinically indicated. 7. Hepatomegaly with likely underlying hepatic steatosis. 8.  Aortic Atherosclerosis (ICD10-I70.0). Electronically Signed   By: Constance Holster M.D.   On: 02/24/2020 18:13   US RENAL  Result Date: 02/26/2020 CLINICAL DATA:  Acute kidney injury. EXAM: RENAL / URINARY TRACT ULTRASOUND COMPLETE COMPARISON:  CT angiogram of the abdomen and pelvis from 02/24/2020. FINDINGS: Right Kidney: Renal measurements: 11.2 x 4.8 x 6.0 cm = volume: 169 mL. Mildly echogenic renal parenchyma, normal thickness. No hydronephrosis. No renal mass.  Left Kidney: Renal measurements: 12.2 x 5.4 x 5.6 cm = volume: 184 mL. Mildly echogenic renal parenchyma, normal thickness. No hydronephrosis. No renal mass. Bladder: Appears normal for degree of bladder distention. Other: Incidentally noted diffuse gallbladder wall thickening. IMPRESSION: 1. No hydronephrosis. 2. Mildly echogenic normal size kidneys, compatible with reported history of nonspecific acute renal parenchymal disease. 3. Normal bladder. 4. Incidentally noted nonspecific diffuse gallbladder wall thickening. If there is clinical concern for acute cholecystitis, dedicated right upper quadrant abdominal sonogram could be obtained for further evaluation. Electronically Signed   By: Ilona Sorrel M.D.   On: 02/26/2020 10:20   DG Chest Port 1 View  Result Date: 02/27/2020 CLINICAL DATA:  Short of breath EXAM: PORTABLE CHEST 1 VIEW COMPARISON:  02/26/2020 FINDINGS: Cardiac enlargement and vascular congestion. Mild improvement in bilateral airspace disease consistent with edema. No significant effusion. Left arm PICC tip in the SVC IMPRESSION: Congestive heart failure with mild interval improvement in edema. Electronically Signed   By: Franchot Gallo M.D.   On: 02/27/2020 08:02   DG Chest Port 1 View  Result Date: 02/26/2020 CLINICAL DATA:  Shortness of breath; history breast cancer, CHF, chronic kidney disease, COPD, diabetes mellitus, hypertension EXAM: PORTABLE CHEST 1 VIEW COMPARISON:  Portable exam 0729 hours compared to 02/25/2020 FINDINGS: LEFT arm PICC line tip projects over SVC. Enlargement of cardiac silhouette. Mediastinal contours normal. BILATERAL pulmonary infiltrates identified, basilar predominance, favor multifocal pneumonia over pulmonary edema. Subsegmental atelectasis lower LEFT lung. Question minimal LEFT pleural effusion. No pneumothorax. Endplate spur formation thoracic spine. IMPRESSION: Persistent BILATERAL pulmonary infiltrates, predominantly basilar, favoring multifocal  pneumonia. Electronically Signed   By: Lavonia Dana M.D.   On: 02/26/2020 07:56   DG Chest Port 1 View  Result Date: 02/25/2020 CLINICAL DATA:  Shortness of breath, on BiPAP EXAM: PORTABLE CHEST 1 VIEW COMPARISON:  Portable exam at 1039 hrs compared to 02/23/2020 FINDINGS: Enlargement of cardiac silhouette with pulmonary vascular congestion. Diffuse infiltrates throughout both lungs, greater at the lower lungs bilaterally, question multifocal pneumonia pulmonary edema considered less likely. No pleural effusion or pneumothorax. Osseous structures unremarkable IMPRESSION: Enlargement of cardiac silhouette with slight pulmonary vascular congestion. Diffuse BILATERAL pulmonary infiltrates greater at lower lungs bilaterally, favor multifocal pneumonia or less likely pulmonary edema. Electronically Signed   By: Lavonia Dana M.D.   On: 02/25/2020 10:49   DG Chest Port 1 View  Result Date: 02/23/2020 CLINICAL DATA:  Concern for sepsis EXAM: PORTABLE CHEST 1 VIEW COMPARISON:  10/09/2019 FINDINGS: Increased bibasilar airspace opacities worse on the left obscuring the left hemidiaphragm concerning for bibasilar  pneumonia. Stable cardiomegaly. No CHF pattern or large effusion. No pneumothorax. Aorta atherosclerotic. IMPRESSION: Bibasilar airspace process, worse on the left concerning for pneumonia. Electronically Signed   By: Jerilynn Mages.  Shick M.D.   On: 02/23/2020 10:24   DG Foot 2 Views Left  Result Date: 02/23/2020 CLINICAL DATA:  Chronic wound EXAM: LEFT FOOT - 2 VIEW COMPARISON:  Sep 21, 2019 FINDINGS: Frontal and lateral views were obtained. There is soft tissue swelling. There is suggestion of soft tissue air medial to the distal aspect of the first metatarsal. There is no erosive change or osteomyelitis. No fracture or dislocation. Joint spaces overall appear unremarkable. There are prominent posterior and inferior calcaneal spurs. IMPRESSION: Soft tissue swelling with soft tissue air medial to the mid to distal  first metatarsal. No bony destruction or erosion. No appreciable joint space narrowing. There are calcaneal spurs. No fracture or dislocation evident. Electronically Signed   By: Lowella Grip III M.D.   On: 02/23/2020 10:22   CT ANGIO CHEST AORTA W/CM &/OR WO/CM  Result Date: 02/24/2020 CLINICAL DATA:  Chest pain.  Peripheral vascular disease. EXAM: CT ANGIOGRAPHY OF ABDOMINAL AORTA WITH ILIOFEMORAL RUNOFF CT ANGIOGRAPHY OF ABDOMINAL AORTA WITH ILIOFEMORAL RUNOFF TECHNIQUE: Multidetector CT imaging of the abdomen, pelvis and lower extremities was performed using the standard protocol during bolus administration of intravenous contrast. Multiplanar CT image reconstructions and MIPs were obtained to evaluate the vascular anatomy. Multidetector CT imaging of the abdomen, pelvis and lower extremities was performed using the standard protocol during bolus administration of intravenous contrast. Multiplanar CT image reconstructions and MIPs were obtained to evaluate the vascular anatomy. CONTRAST:  128mL OMNIPAQUE IOHEXOL 350 MG/ML SOLN COMPARISON:  CT dated October 09, 2019. CT PE study dated 02/25/2019 FINDINGS: CHEST Cardiovascular: There is no evidence for large centrally located pulmonary embolism. Detection of smaller pulmonary emboli is limited by technique. There is no evidence for a thoracic aortic dissection or aneurysm. The heart size is enlarged. There is no significant pericardial effusion. There are mild coronary artery calcifications. Mediastinum/Nodes: --there are enlarged mediastinal lymph nodes. For example there is a pretracheal lymph node measuring approximately 1.8 x 2.9 cm (axial series 7, image 40). This is relatively similar to prior study. --mild hilar adenopathy is noted. -- No axillary lymphadenopathy. --there is significant right supraclavicular adenopathy which has progressed since the prior study. The dominant lymph node measures 1.2 by 3 point 4 cm (axial series 7, image 15). --  Normal thyroid gland where visualized. -  Unremarkable esophagus. Lungs/Pleura: There is a moderate to large right-sided pleural effusion. There is a small left-sided pleural effusion. There is bibasilar atelectasis. There is no pneumothorax. Musculoskeletal: No chest wall abnormality. No bony spinal canal stenosis. Review of the MIP images confirms the above findings. VASCULAR Aorta: There are atherosclerotic changes of the abdominal aorta without evidence for an aneurysm. Celiac: Patent without evidence of aneurysm, dissection, vasculitis or significant stenosis. SMA: Patent without evidence of aneurysm, dissection, vasculitis or significant stenosis. Renals: Both renal arteries are patent without evidence of aneurysm, dissection, vasculitis, fibromuscular dysplasia or significant stenosis. IMA: Patent without evidence of aneurysm, dissection, vasculitis or significant stenosis. RIGHT Lower Extremity Inflow: Common, internal and external iliac arteries are patent without evidence of aneurysm, dissection, vasculitis or significant stenosis. Outflow: The right common femoral artery is patent. The right SFA is occluded proximally. There is near immediate reconstitution. There are tandem high-grade areas of stenosis throughout the right SFA. There is new complete occlusion of the mid to distal  right SFA (axial series 7, image 247). Evaluation of the right popliteal artery is limited, however there appears to be high-grade stenosis throughout the vessel. Runoff: There is a high-grade stenosis of the proximal right anterior tibial artery. There appears to be a single vessel runoff to the right lower extremity via the anterior tibial artery. LEFT Lower Extremity Inflow: Common, internal and external iliac arteries are patent without evidence of aneurysm, dissection, vasculitis or significant stenosis. Outflow: The left CFA is widely patent. The proximal left SFA is entirely occluded. There is a new left femoral to below  knee popliteal artery bypass graft. The graft is widely patent throughout its course. The profunda femoris artery is patent. Runoff: Heavy calcifications limit evaluation of the tibial vasculature. However there appears to be a 2 vessel runoff via the anterior tibial and posterior tibial arteries. The peroneal artery is occluded. Veins: The veins are not well evaluated on this study. Review of the MIP images confirms the above findings. NON-VASCULAR Hepatobiliary: There is hepatomegaly with likely underlying hepatic steatosis. There appears to be gallbladder wall thickening with pericholecystic free fluid.There is no biliary ductal dilation. Pancreas: Normal contours without ductal dilatation. No peripancreatic fluid collection. Spleen: Unremarkable. Adrenals/Urinary Tract: --Adrenal glands: Unremarkable. --Right kidney/ureter: No hydronephrosis or radiopaque kidney stones. --Left kidney/ureter: No hydronephrosis or radiopaque kidney stones. --Urinary bladder: Unremarkable. Stomach/Bowel: --Stomach/Duodenum: No hiatal hernia or other gastric abnormality. Normal duodenal course and caliber. --Small bowel: Unremarkable. --Colon: Unremarkable. --Appendix: Normal. Lymphatic: --No retroperitoneal lymphadenopathy. --No mesenteric lymphadenopathy. --there is mild bilateral inguinal adenopathy, left worse than right. Reproductive: Unremarkable Other: No ascites or free air. The abdominal wall is normal. Musculoskeletal. There are postsurgical changes of the left inguinal region. There is bilateral nonspecific lower extremity edema, left worse than right. There are small bilateral suprapatellar joint effusions. IMPRESSION: 1. Patent left lower extremity femoral to below knee popliteal artery bypass graft. Evaluation of the left tibial vasculature is limited by calcifications, however there appears to be a 2 vessel runoff to the left ankle via the anterior tibial and posterior tibial arteries. 2. Progressive peripheral  vascular disease involving the right lower extremity. There is now complete occlusion of the mid to distal right superficial femoral artery, new since prior study. There are now appears to be a single vessel runoff to the right foot via the anterior tibial artery. In June, there was a 3 vessel runoff to the right foot. 3. Nonspecific, asymmetric bilateral lower extremity edema (left worse than right). This is of unknown clinical significance. This study cannot adequately exclude a DVT. If there is clinical suspicion for a DVT, follow-up with ultrasound is recommended. 4. No evidence for an acute pulmonary embolism. No evidence for thoracic aortic dissection or aneurysm. 5. Cardiomegaly with small to moderate-sized bilateral pleural effusions, right worse than left. 6. Persistent mediastinal and hilar adenopathy with worsening right supraclavicular adenopathy. Findings may be reactive or due to a lymphoproliferative disorder or metastatic disease. Follow-up is recommended. Of note, the right supraclavicular lymph node is amenable to percutaneous ultrasound-guided biopsy if clinically indicated. 7. Hepatomegaly with likely underlying hepatic steatosis. 8.  Aortic Atherosclerosis (ICD10-I70.0). Electronically Signed   By: Constance Holster M.D.   On: 02/24/2020 18:13   US LIVER DOPPLER  Result Date: 03/01/2020 CLINICAL DATA:  Elevated LFTs.  Evaluate hepatic vasculature. EXAM: DUPLEX ULTRASOUND OF LIVER TECHNIQUE: Color and duplex Doppler ultrasound was performed to evaluate the hepatic in-flow and out-flow vessels. COMPARISON:  Right upper quadrant abdominal ultrasound-02/28/2020 FINDINGS: Liver: While there  is preserved hepatic echogenicity, there is suspected mild nodularity hepatic contour. No discrete hepatic lesions. No intrahepatic biliary duct dilatation. Main Portal Vein size: 1.1 cm Portal Vein Velocities (normal hepatopetal directional flow) Main Prox:  21 cm/sec Main Mid: 21 cm/sec Main Dist:  23  cm/sec Right: 22 cm/sec Left: 13 cm/sec Hepatic Vein Velocities (normal hepatofugal directional flow) Right:  7 cm/sec Middle:  28 cm/sec Left:  20 cm/sec IVC: Present and patent with normal respiratory phasicity. Hepatic Artery Velocity:  58 cm/sec Splenic Vein Velocity:  31 cm/sec Spleen: 8.6 cm x 3.2 cm x 1.6 cm with a total volume of 23 cm^3 (411 cm^3 is upper limit normal) Portal Vein Occlusion/Thrombus: No Splenic Vein Occlusion/Thrombus: No Ascites: None Varices: None IMPRESSION: 1. Patent hepatic vasculature with normal directional flow. 2. Suspected mild nodularity hepatic contour as could be seen in the setting of early cirrhotic change without stigmata of portal venous hypertension, specifically, no evidence of splenomegaly or intra-abdominal ascites. No discrete hepatic lesions. Electronically Signed   By: Sandi Mariscal M.D.   On: 03/01/2020 08:45   VAS [place] LOWER EXTREMITY VENOUS (DVT)  Result Date: 02/25/2020  Lower Venous DVTStudy Indications: Edema.  Risk Factors: Surgery 01-26-2020 LT balloon angioplasty, 10-14-2019 LT fem-pop bypass graft using non-reversed great saphenous vein. Limitations: Body habitus, poor ultrasound/tissue interface and patient intolerant to probe pressure. Comparison Study: No prior studies. Performing Technologist: Darlin Coco  Examination Guidelines: A complete evaluation includes B-mode imaging, spectral Doppler, color Doppler, and power Doppler as needed of all accessible portions of each vessel. Bilateral testing is considered an integral part of a complete examination. Limited examinations for reoccurring indications may be performed as noted. The reflux portion of the exam is performed with the patient in reverse Trendelenburg.  +-----+---------------+---------+-----------+----------+--------------+ RIGHTCompressibilityPhasicitySpontaneityPropertiesThrombus Aging +-----+---------------+---------+-----------+----------+--------------+ CFV  Full           Yes       Yes                                 +-----+---------------+---------+-----------+----------+--------------+   +---------+---------------+---------+-----------+----------+---------------+ LEFT     CompressibilityPhasicitySpontaneityPropertiesThrombus Aging  +---------+---------------+---------+-----------+----------+---------------+ CFV      Full           Yes      Yes                                  +---------+---------------+---------+-----------+----------+---------------+ SFJ      Full                                                         +---------+---------------+---------+-----------+----------+---------------+ FV Prox  Full                                                         +---------+---------------+---------+-----------+----------+---------------+ FV Mid                  Yes      Yes                  Patent  by color +---------+---------------+---------+-----------+----------+---------------+ FV Distal               Yes      Yes                  Patent by color +---------+---------------+---------+-----------+----------+---------------+ PFV      Full                                                         +---------+---------------+---------+-----------+----------+---------------+ POP      Full           Yes      Yes                                  +---------+---------------+---------+-----------+----------+---------------+ PTV      Full                                                         +---------+---------------+---------+-----------+----------+---------------+ PERO                    Yes      Yes                  Patent by color +---------+---------------+---------+-----------+----------+---------------+     Summary: RIGHT: - No evidence of common femoral vein obstruction.  LEFT: - There is no evidence of deep vein thrombosis in the lower extremity. However, portions of this examination were limited- see  technologist comments above.  - No cystic structure found in the popliteal fossa.  *See table(s) above for measurements and observations. Electronically signed by Harold Barban MD on 02/25/2020 at 8:54:52 PM.    Final    [place] EKG SITE RITE  Result Date: 02/25/2020 If Site Rite image not attached, placement could not be confirmed due to current cardiac rhythm.  US Abdomen Limited RUQ (LIVER/GB)  Result Date: 02/28/2020 CLINICAL DATA:  Transaminitis, EXAM: ULTRASOUND ABDOMEN LIMITED RIGHT UPPER QUADRANT COMPARISON:  None. FINDINGS: Gallbladder: The gallbladder is mildly distended and there is pericholecystic fluid identified within the a gallbladder fossa. A small amount of layering sludge is seen within the gallbladder neck. There is no associated gallbladder wall thickening. The sonographic Percell Miller sign is reportedly positive. Common bile duct: Diameter: 3 mm Liver: Liver parenchymal echogenicity and echotexture is normal. No focal intrahepatic masses are seen and there is no intrahepatic biliary ductal dilation. Portal vein is patent on color Doppler imaging with normal direction of blood flow towards the liver. Other: No ascites IMPRESSION: Mild distention of the gallbladder, gallbladder sludge, and pericholecystic fluid identified. Reportedly positive sonographic Murphy sign. Together, the findings are suspicious for changes of acute, calculus cholecystitis. Electronically Signed   By: Fidela Salisbury MD   On: 02/28/2020 01:23

## 2020-03-01 NOTE — Evaluation (Signed)
Speech Language Pathology Evaluation Patient Details Name: Morgan Beasley MRN: [id number] DOB: 14-Jul-1960 Today's Date: 03/01/2020 Time: 4970-2637 SLP Time Calculation (min) (ACUTE ONLY): 14 min  Problem List:  Patient Active Problem List   Diagnosis Date Noted  . Cerebral embolism with cerebral infarction 03/01/2020  . Septic shock ([place]) 02/23/2020  . Chronic systolic CHF (congestive heart failure) ([place]) 02/23/2020  . Obesity (BMI 30-39.9) 02/23/2020  . Ischemia of left lower extremity 10/09/2019  . PVD (peripheral vascular disease) ([place])   . Diabetic peripheral neuropathy associated with type 2 diabetes mellitus ([place])   . Acute on chronic diastolic congestive heart failure ([place])   . Lower extremity edema   . Dyspnea 09/21/2019  . Bronchitis 02/25/2019  . Uncontrolled diabetes mellitus with circulatory complication, with long-term current use of insulin ([place]) 02/25/2019  . Peripheral neuropathy 02/25/2019  . COPD (chronic obstructive pulmonary disease) ([place]) 02/25/2019  . Malignant neoplasm of upper-outer quadrant of right breast in female, estrogen receptor positive ([place]) 01/08/2019   Past Medical History:  Past Medical History:  Diagnosis Date  . Alcohol dependence ([place])   . Anemia   . Anxiety   . Breast cancer ([place])   . Chronic combined systolic and diastolic CHF (congestive heart failure) ([place])   . Cigarette nicotine dependence   . CKD (chronic kidney disease), stage III ([place])   . Colon polyps   . COPD (chronic obstructive pulmonary disease) ([place])   . Diabetes mellitus without complication ([place])   . Diabetic neuropathy ([place])   . Elevated lipase   . Gout   . Hyperlipemia   . Hypertension   . Insomnia   . Lymphedema   . Marijuana use   . Mild CAD 2016   a. NSTEMI 2016 in context of cocaine abuse, 50% RCA% at that time.  . OSA treated with BiPAP   . PAD (peripheral artery disease) ([place])    a. s/p L SFA stenting 08/2019. b. left fem-to-below-knee-popliteal  bypass 09/2019.  Marland Kitchen Sleep apnea    wears BIPAP  . Ulcer of foot ([place])    right   Past Surgical History:  Past Surgical History:  Procedure Laterality Date  . ABDOMINAL AORTOGRAM W/LOWER EXTREMITY N/A 09/26/2019   Procedure: ABDOMINAL AORTOGRAM W/LOWER EXTREMITY;  Surgeon: Angelia Mould, MD;  Location: [facility];  Service: Cardiovascular;  Laterality: N/A;  . ABDOMINAL AORTOGRAM W/LOWER EXTREMITY Bilateral 12/08/2019   Procedure: ABDOMINAL AORTOGRAM W/LOWER EXTREMITY;  Surgeon: Waynetta Sandy, MD;  Location: [facility];  Service: Cardiovascular;  Laterality: Bilateral;  . ABDOMINAL AORTOGRAM W/LOWER EXTREMITY Left 01/26/2020   Procedure: ABDOMINAL AORTOGRAM W/LOWER EXTREMITY;  Surgeon: Waynetta Sandy, MD;  Location: [facility];  Service: Cardiovascular;  Laterality: Left;  . ABDOMINAL HYSTERECTOMY    . [facility]  . FEMORAL-POPLITEAL BYPASS GRAFT Left 10/14/2019   Procedure: BYPASS GRAFT LEFT FEMORAL-POPLITEAL ARTERY USING NONREVERSED SAPHENOUS VEIN;  Surgeon: Waynetta Sandy, MD;  Location: [facility];  Service: Vascular;  Laterality: Left;  Marland Kitchen MASTECTOMY Right April 2016  . PERIPHERAL VASCULAR ATHERECTOMY Left 01/26/2020   Procedure: PERIPHERAL VASCULAR ATHERECTOMY;  Surgeon: Waynetta Sandy, MD;  Location: [facility];  Service: Cardiovascular;  Laterality: Left;  PT and AT - Laser  . PERIPHERAL VASCULAR BALLOON ANGIOPLASTY Left 01/26/2020   Procedure: PERIPHERAL VASCULAR BALLOON ANGIOPLASTY;  Surgeon: Waynetta Sandy, MD;  Location: [facility];  Service: Cardiovascular;  Laterality: Left;  [facility]   .  PERIPHERAL VASCULAR INTERVENTION Left 09/26/2019   Procedure: PERIPHERAL VASCULAR INTERVENTION;  Surgeon: Angelia Mould, MD;  Location: [facility];  Service: Cardiovascular;  Laterality: Left;  superficial femoral   HPI:  Pt is a 59 y.o. female with past medical  history of prior substance abuse (THC, cocaine),  obesity, poorly controlled diabetes mellitus, CAD, systolic/diastolic CHFongoing tobacco abuse, stage II CKD, COPD not on home oxygen, OSA on nightly BiPAP, and hypertension plus PVD/PAD status post angioplasty and stent placement in the past and left femoral to below-knee popliteal bypass 09/2019 with nonhealing left foot ulcer who presented the ED with c/o left leg and foot pain but was a poor historian at the time.  CT head was done for encephalopathy and revealed a subacute right occipital infarct.  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   Assessment / Plan / Recommendation Clinical Impression  Pt's confusion evident this am marked by irrelevant utterances, difficulty maintainng topic with question of hallucinations. Speech was less than 50% intelligible. Her sustained attention was poor-fair and comprehended one step commands intermittently when attending. She was oriented to herself only. ECHO technician arrived and assessment cut short. ST will continue to diagnostically treat pt for dysarthria and cogntive impairments and modify goals as needed.        SLP Assessment  SLP Recommendation/Assessment: Patient needs continued Speech Lanaguage Pathology Services SLP Visit Diagnosis: Cognitive communication deficit (R41.841);Dysarthria and anarthria (R47.1)    Follow Up Recommendations  24 hour supervision/assistance    Frequency and Duration min 2x/week  2 weeks      SLP Evaluation Cognition  Overall Cognitive Status: Impaired/Different from baseline Arousal/Alertness: Awake/alert Orientation Level: Oriented to person;Disoriented to place;Disoriented to time;Disoriented to situation Attention: Sustained Sustained Attention: Impaired Sustained Attention Impairment: Verbal basic;Functional basic Memory:  (TBA) Awareness: Impaired Awareness Impairment: Intellectual  impairment;Emergent impairment;Anticipatory impairment Problem Solving: Impaired Problem Solving Impairment: Verbal basic Behaviors: Restless Safety/Judgment: Impaired       Comprehension  Auditory Comprehension Overall Auditory Comprehension: Other (comment) (decreased suspect d/t attention deficits) Commands: Impaired One Step Basic Commands: 25-49% accurate Interfering Components: Attention Visual Recognition/Discrimination Discrimination: Not tested Reading Comprehension Reading Status:  (TBA)    Expression Expression Primary Mode of Expression: Verbal Verbal Expression Overall Verbal Expression: Appears within functional limits for tasks assessed Initiation: No impairment Level of Generative/Spontaneous Verbalization: Conversation Repetition:  (NT) Pragmatics: Impairment Impairments: Topic appropriateness;Topic maintenance Interfering Components: Attention Written Expression Dominant Hand: Right Written Expression:  (TBA)   Oral / Motor  Oral Motor/Sensory Function Overall Oral Motor/Sensory Function: Within functional limits Motor Speech Overall Motor Speech: Impaired Respiration: Within functional limits Phonation: Low vocal intensity Resonance: Within functional limits Articulation: Impaired Level of Impairment: Sentence Intelligibility: Intelligibility reduced Word: 50-74% accurate Phrase: 25-49% accurate Sentence: 25-49% accurate Motor Planning: Witnin functional limits   GO                    Houston Siren 03/01/2020, 9:34 AM  Orbie Pyo Colvin Caroli.Ed Risk analyst [phone] Office [phone]

## 2020-03-01 NOTE — Progress Notes (Signed)
  Echocardiogram 2D Echocardiogram has been performed. Patient refused half way through exam.  Morgan Beasley 03/01/2020, 9:52 AM

## 2020-03-01 NOTE — Progress Notes (Signed)
  Progress Note    03/01/2020 5:22 PM * No surgery date entered *  Subjective: Patient agitated on my exam  Vitals:   03/01/20 0723 03/01/20 0727  BP:  (!) 144/84  Pulse: 87 89  Resp: (!) 22 17  Temp:  98.2 F (36.8 C)  SpO2: 95% 94%    Physical Exam: Agitated Stable left foot wound from previous pictures during this hospitalization left foot is warm and well-perfused otherwise  CBC    Component Value Date/Time   WBC 6.8 03/01/2020 0500   RBC 3.48 (L) 03/01/2020 0500   HGB 8.5 (L) 03/01/2020 0500   HGB 12.0 11/17/2019 1449   HCT 28.2 (L) 03/01/2020 0500   HCT 37.8 11/17/2019 1449   PLT 350 03/01/2020 0500   PLT 431 11/17/2019 1449   MCV 81.0 03/01/2020 0500   MCV 83 11/17/2019 1449   MCH 24.4 (L) 03/01/2020 0500   MCHC 30.1 03/01/2020 0500   RDW 19.1 (H) 03/01/2020 0500   RDW 17.5 (H) 11/17/2019 1449   LYMPHSABS 1.2 03/01/2020 0500   MONOABS 0.8 03/01/2020 0500   EOSABS 0.1 03/01/2020 0500   BASOSABS 0.0 03/01/2020 0500    BMET    Component Value Date/Time   NA 135 03/01/2020 0500   NA 137 02/09/2020 0825   K 3.3 (L) 03/01/2020 0500   CL 120 (H) 03/01/2020 0500   CO2 20 (L) 03/01/2020 0500   GLUCOSE 64 (L) 03/01/2020 0500   BUN 27 (H) 03/01/2020 0500   BUN 14 02/09/2020 0825   CREATININE 1.87 (H) 03/01/2020 0500   CALCIUM 6.4 (LL) 03/01/2020 0500   GFRNONAA 31 (L) 03/01/2020 0500   GFRAA 65 02/09/2020 0825    INR    Component Value Date/Time   INR 2.7 (H) 03/01/2020 0500     Intake/Output Summary (Last 24 hours) at 03/01/2020 1722 Last data filed at 03/01/2020 0313 Gross per 24 hour  Intake --  Output 1800 ml  Net -1800 ml     Assessment/plan:  59 y.o. female is 1 femoropopliteal bypass and subsequent laser arthrectomy of her tibial vessels.  Initially she did have sloughing of the foot now she appears to have a stable wound which is failed to heal.  I discussed with the family the possibility of continue to allow her to heal with  antibiotics versus primary likely below-knee amputation.  I will get ABIs and order her a Darco shoe for the meantime and will likely plan treatment as an outpatient.    Hitomi Slape C. Donzetta Matters, MD [facility] Office: [phone] Pager: [phone]  03/01/2020 5:22 PM

## 2020-03-01 NOTE — Plan of Care (Signed)
Pt maintaining SPO2 >90% RA.

## 2020-03-01 NOTE — Progress Notes (Addendum)
Daily Rounding Note  03/01/2020, 11:19 AM  LOS: 7 days   SUBJECTIVE:   Chief complaint: elevated LFTs   Pt refused Echo 1/2 way through exam.   During today's SLP eval: " confusion marked by irrelevatnt utterances, difficulty maintining topic, ? Hallucinations" Transported to Nuc med for HIDA and was combative, uttering profanities, refused study.    Pt denies abdominal pain, nausea.  Just wants to go home, at other times says she wants a different hospital room.    OBJECTIVE:         Vital signs in last 24 hours:    Temp:  [97.5 F (36.4 C)-98.4 F (36.9 C)] 98.2 F (36.8 C) (11/01 0727) Pulse Rate:  [87-92] 89 (11/01 0727) Resp:  [17-22] 17 (11/01 0727) BP: (133-147)/(77-96) 144/84 (11/01 0727) SpO2:  [91 %-98 %] 94 % (11/01 0727)   Filed Weights   02/23/20 0937  Weight: 90.3 kg   General: agitated and talking about wanting to leave.  Daughter at bedside is unable to calm the pt   Heart: RRR Chest: no labored breathing or cough Abdomen: obese, soft, active BS, NT  Extremities: + LE edema and ulcer on L foot.   Neuro/Psych:  Moves all 4 limbs.    Intake/Output from previous day: 10/31 0701 - 11/01 0700 In: -  Out: 1800 [Urine:1800]  Intake/Output this shift: No intake/output data recorded.  Lab Results: Recent Labs    02/28/20 0500 02/29/20 0455 03/01/20 0500  WBC 7.3 7.7 6.8  HGB 8.5* 9.9* 8.5*  HCT 28.2* 32.2* 28.2*  PLT 319 378 350   BMET Recent Labs    02/28/20 0500 02/29/20 0455 03/01/20 0500  NA 134* 134* 135  K 2.9* 4.7 3.3*  CL 122* 104 120*  CO2 18* 23 20*  GLUCOSE 121* 80 64*  BUN 33* 39* 27*  CREATININE 2.38* 2.73* 1.87*  CALCIUM 5.8* 7.6* 6.4*   LFT Recent Labs    02/28/20 0500 02/29/20 0455 03/01/20 0500  PROT 5.4* 4.9* 5.4*  ALBUMIN 1.1* 1.4* 1.2*  AST 829* 758* 266*  ALT 235* 341* 213*  ALKPHOS 181* 290* 214*  BILITOT 0.6 0.6 0.6   PT/INR Recent Labs     02/29/20 0455 03/01/20 0500  LABPROT 22.8* 27.6*  INR 2.1* 2.7*   Hepatitis Panel Recent Labs    02/28/20 0500  HEPBSAG NON REACTIVE  HCVAB NON REACTIVE  HEPAIGM NON REACTIVE  HEPBIGM NON REACTIVE    Studies/Results: CT HEAD WO CONTRAST  Result Date: 02/29/2020 CLINICAL DATA:  Delirium. EXAM: CT HEAD WITHOUT CONTRAST TECHNIQUE: Contiguous axial images were obtained from the base of the skull through the vertex without intravenous contrast. COMPARISON:  December 17, 2016 FINDINGS: Brain: No subdural, epidural, or subarachnoid hemorrhage. Cerebellum, brainstem, and basal cisterns are normal. Ventricles and sulci are unremarkable. No mass effect or midline shift. Decreased attenuation involving the right temporal occipital cortex. There is also focal low-attenuation in the left occipital lobe on series 3, image 54. No other sites of infarct or ischemia identified. Vascular: Calcified atherosclerosis in the intracranial carotids. Skull: Normal. Negative for fracture or focal lesion. Sinuses/Orbits: No acute finding. Other: None. IMPRESSION: 1. Decreased attenuation in the right temporal occipital cortex and in the left occipital cortex. The findings are consistent with age-indeterminate infarcts but are favored to be subacute. MRI could better assess. The presence of probable subacute infarcts in 2 vascular territories should raise the possibility of an embolic cause. 2.  No other acute abnormalities. 3. No other acute abnormalities. These results will be called to the ordering clinician or representative by the Radiologist Assistant, and communication documented in the PACS or [employer]. Electronically Signed   By: Dorise Bullion III M.D   On: 02/29/2020 10:33   MR ANGIO HEAD WO CONTRAST  Result Date: 02/29/2020 CLINICAL DATA:  Abnormal CT EXAM: MRI HEAD WITHOUT CONTRAST MRA HEAD WITHOUT CONTRAST MRA NECK WITHOUT CONTRAST TECHNIQUE: Multiplanar, multiecho pulse sequences of the brain and  surrounding structures were obtained without intravenous contrast. Angiographic images of the Circle of Willis were obtained using MRA technique without intravenous contrast. Angiographic images of the neck were obtained using MRA technique without intravenous contrast. Carotid stenosis measurements (when applicable) are obtained utilizing NASCET criteria, using the distal internal carotid diameter as the denominator. COMPARISON:  None. FINDINGS: MRI HEAD Brain: There is restricted diffusion in the lateral right occipital lobe with some parietotemporal extension reflecting acute infarction. Chronic left occipital infarct. Few additional patchy foci of T2 hyperintensity in the supratentorial and pontine white matter are nonspecific but probably reflect mild chronic microvascular ischemic changes. Foci of susceptibility are present in the posterior left lentiform nucleus, left thalamus, and midline cerebellum likely reflecting chronic microhemorrhages. There is no intracranial mass or significant mass effect. There is no hydrocephalus or extra-axial fluid collection. Vascular: Major vessel flow voids at the skull base are preserved. Skull and upper cervical spine: Normal marrow signal is preserved. Sinuses/Orbits: Paranasal sinuses are aerated. Orbits are unremarkable. Other: Sella is unremarkable.  Mastoid air cells are clear. MRA HEAD Intracranial internal carotid arteries are patent. Middle and anterior cerebral arteries are patent. Included intracranial vertebral arteries, basilar artery, posterior cerebral arteries are patent. There is no significant stenosis or aneurysm. MRA NECK Included portions of the common carotid arteries are patent. Internal and external carotid arteries are patent. There is no hemodynamically significant stenosis at the ICA origins by NASCET criteria. Included extracranial vertebral arteries are patent and codominant. IMPRESSION: Acute infarction of the lateral right occipital lobe with  some parietotemporal extension. Chronic left occipital infarct. Mild chronic microvascular ischemic changes. No large vessel occlusion or hemodynamically significant stenosis. Electronically Signed   By: Macy Mis M.D.   On: 02/29/2020 13:40   MR ANGIO NECK WO CONTRAST  Result Date: 02/29/2020 CLINICAL DATA:  Abnormal CT EXAM: MRI HEAD WITHOUT CONTRAST MRA HEAD WITHOUT CONTRAST MRA NECK WITHOUT CONTRAST TECHNIQUE: Multiplanar, multiecho pulse sequences of the brain and surrounding structures were obtained without intravenous contrast. Angiographic images of the Circle of Willis were obtained using MRA technique without intravenous contrast. Angiographic images of the neck were obtained using MRA technique without intravenous contrast. Carotid stenosis measurements (when applicable) are obtained utilizing NASCET criteria, using the distal internal carotid diameter as the denominator. COMPARISON:  None. FINDINGS: MRI HEAD Brain: There is restricted diffusion in the lateral right occipital lobe with some parietotemporal extension reflecting acute infarction. Chronic left occipital infarct. Few additional patchy foci of T2 hyperintensity in the supratentorial and pontine white matter are nonspecific but probably reflect mild chronic microvascular ischemic changes. Foci of susceptibility are present in the posterior left lentiform nucleus, left thalamus, and midline cerebellum likely reflecting chronic microhemorrhages. There is no intracranial mass or significant mass effect. There is no hydrocephalus or extra-axial fluid collection. Vascular: Major vessel flow voids at the skull base are preserved. Skull and upper cervical spine: Normal marrow signal is preserved. Sinuses/Orbits: Paranasal sinuses are aerated. Orbits are unremarkable. Other: Sella is unremarkable.  Mastoid air cells are clear. MRA HEAD Intracranial internal carotid arteries are patent. Middle and anterior cerebral arteries are patent.  Included intracranial vertebral arteries, basilar artery, posterior cerebral arteries are patent. There is no significant stenosis or aneurysm. MRA NECK Included portions of the common carotid arteries are patent. Internal and external carotid arteries are patent. There is no hemodynamically significant stenosis at the ICA origins by NASCET criteria. Included extracranial vertebral arteries are patent and codominant. IMPRESSION: Acute infarction of the lateral right occipital lobe with some parietotemporal extension. Chronic left occipital infarct. Mild chronic microvascular ischemic changes. No large vessel occlusion or hemodynamically significant stenosis. Electronically Signed   By: Macy Mis M.D.   On: 02/29/2020 13:40   MR BRAIN WO CONTRAST  Result Date: 02/29/2020 CLINICAL DATA:  Abnormal CT EXAM: MRI HEAD WITHOUT CONTRAST MRA HEAD WITHOUT CONTRAST MRA NECK WITHOUT CONTRAST TECHNIQUE: Multiplanar, multiecho pulse sequences of the brain and surrounding structures were obtained without intravenous contrast. Angiographic images of the Circle of Willis were obtained using MRA technique without intravenous contrast. Angiographic images of the neck were obtained using MRA technique without intravenous contrast. Carotid stenosis measurements (when applicable) are obtained utilizing NASCET criteria, using the distal internal carotid diameter as the denominator. COMPARISON:  None. FINDINGS: MRI HEAD Brain: There is restricted diffusion in the lateral right occipital lobe with some parietotemporal extension reflecting acute infarction. Chronic left occipital infarct. Few additional patchy foci of T2 hyperintensity in the supratentorial and pontine white matter are nonspecific but probably reflect mild chronic microvascular ischemic changes. Foci of susceptibility are present in the posterior left lentiform nucleus, left thalamus, and midline cerebellum likely reflecting chronic microhemorrhages. There is no  intracranial mass or significant mass effect. There is no hydrocephalus or extra-axial fluid collection. Vascular: Major vessel flow voids at the skull base are preserved. Skull and upper cervical spine: Normal marrow signal is preserved. Sinuses/Orbits: Paranasal sinuses are aerated. Orbits are unremarkable. Other: Sella is unremarkable.  Mastoid air cells are clear. MRA HEAD Intracranial internal carotid arteries are patent. Middle and anterior cerebral arteries are patent. Included intracranial vertebral arteries, basilar artery, posterior cerebral arteries are patent. There is no significant stenosis or aneurysm. MRA NECK Included portions of the common carotid arteries are patent. Internal and external carotid arteries are patent. There is no hemodynamically significant stenosis at the ICA origins by NASCET criteria. Included extracranial vertebral arteries are patent and codominant. IMPRESSION: Acute infarction of the lateral right occipital lobe with some parietotemporal extension. Chronic left occipital infarct. Mild chronic microvascular ischemic changes. No large vessel occlusion or hemodynamically significant stenosis. Electronically Signed   By: Macy Mis M.D.   On: 02/29/2020 13:40   US LIVER DOPPLER  Result Date: 03/01/2020 CLINICAL DATA:  Elevated LFTs.  Evaluate hepatic vasculature. EXAM: DUPLEX ULTRASOUND OF LIVER TECHNIQUE: Color and duplex Doppler ultrasound was performed to evaluate the hepatic in-flow and out-flow vessels. COMPARISON:  Right upper quadrant abdominal ultrasound-02/28/2020 FINDINGS: Liver: While there is preserved hepatic echogenicity, there is suspected mild nodularity hepatic contour. No discrete hepatic lesions. No intrahepatic biliary duct dilatation. Main Portal Vein size: 1.1 cm Portal Vein Velocities (normal hepatopetal directional flow) Main Prox:  21 cm/sec Main Mid: 21 cm/sec Main Dist:  23 cm/sec Right: 22 cm/sec Left: 13 cm/sec Hepatic Vein Velocities (normal  hepatofugal directional flow) Right:  7 cm/sec Middle:  28 cm/sec Left:  20 cm/sec IVC: Present and patent with normal respiratory phasicity. Hepatic Artery Velocity:  58 cm/sec Splenic Vein Velocity:  31  cm/sec Spleen: 8.6 cm x 3.2 cm x 1.6 cm with a total volume of 23 cm^3 (411 cm^3 is upper limit normal) Portal Vein Occlusion/Thrombus: No Splenic Vein Occlusion/Thrombus: No Ascites: None Varices: None IMPRESSION: 1. Patent hepatic vasculature with normal directional flow. 2. Suspected mild nodularity hepatic contour as could be seen in the setting of early cirrhotic change without stigmata of portal venous hypertension, specifically, no evidence of splenomegaly or intra-abdominal ascites. No discrete hepatic lesions. Electronically Signed   By: Sandi Mariscal M.D.   On: 03/01/2020 08:45    Scheduled Meds: . Chlorhexidine Gluconate Cloth  6 each Topical Daily  . clopidogrel  75 mg Oral Daily  . collagenase   Topical Daily  . fluticasone furoate-vilanterol  1 puff Inhalation Daily  . heparin injection (subcutaneous)  5,000 Units Subcutaneous Q8H  . insulin aspart  0-9 Units Subcutaneous Q6H  . insulin glargine  20 Units Subcutaneous Daily  . lipase/protease/amylase  72,000 Units Oral TID WC  . pantoprazole  40 mg Oral Daily   Continuous Infusions: . ceFEPime (MAXIPIME) IV 2 g (03/01/20 0458)  . lactated ringers Stopped (02/26/20 1041)  . lactated ringers 50 mL/hr at 03/01/20 0951  . phytonadione (VITAMIN K) IV 2 mg (03/01/20 1206)  . potassium chloride 10 mEq (03/01/20 1208)   PRN Meds:.acetaminophen **OR** [DISCONTINUED] acetaminophen, dextrose, dextrose, lipase/protease/amylase, naLOXone (NARCAN)  injection, [DISCONTINUED] ondansetron **OR** ondansetron (ZOFRAN) IV, polyethylene glycol   ASSESMENT:   *   Elevated LFTs. T bili normal.  AST/ALT 920/181 >> 266/213.  Alk phos 290 >> 214.  Acute hepatitis serologies negative/non-reactive.   02/28/2020 abdominal ultrasound with distended GB, GB  sludge, peri-cholecystic fluid.  No gallbladder wall thickening.  3 mm CBD.  Normal liver echotexture.  Patent PV w normal directional flow.  Findings s/o cholecystitis but surgery does not feel acute cholecystitis is present. 10/31 Liver Dopplers: Patent hepatic vasculature with normal directional flow. Suspected mild nodularity hepatic contour as could be seen in the setting of early cirrhotic change without stigmata of portal venous Hypertension.  No splenomegaly or ascites. No discrete hepatic lesions Vehemently refused HIDA scan this AM.  *   Chronic pancreatitis s/t ETOH.  Chronic abdominal pain.  At baseline, on Elavil, gabapentin for chronic abd pain along w Creon.    *   AMS presenting w increasing lethargy. Now displaying agitation, psychosis, cognitive impairment. CT head revealed subacute right occipital infarct.  Neuro is following.   ?  Underlying psychiatric disorder.  Reading the GI office note from September 2020 was crying, had difficulty staying on topic and was a difficult historian.    *   Dysarthria.  See review of SLP above.    *   [place] anemia.  Adenomatous colon polyps 2017.   06/05/19 Colonoscopy: aborted due to poor prep. 07/15/19 colonoscopy after double prep:  Still had a large amount of stool throughout colon which required extensive lavage.  Sessile, 3 mm polyps removed from cecum and a sending colon.  Sigmoid tics.  Nonbleeding internal hemorrhoids.  Pathology: Tubular adenoma and sessile serrated polyp, no HGD.  Recommend repeat colonoscopy 06/2022.  *   Poorly controlled DM.    *   OSA  *  Combined CHF.  Await reading of incomplete echo.  *   PVD/PAD.  09/2019 L fem-pop bypass.  Non-healing foot ulcer, cellulitis.  Flagyl, Cefepime for blood clx + for serrattia.   chrnonic Plavix, last dose was 10/30 in AM, on hold.     *  Hypokalemia.    *    AKI.  Improved, baseline CKD 3.     PLAN   *   Per Dr Lyndel Safe.    *   Continue soft diet.      Morgan Beasley   03/01/2020, 11:19 AM Phone [phone]    Attending physician's note   I have taken an interval history, reviewed the chart and examined the patient. I agree with the Advanced Practitioner's note, impression and recommendations.   Case was also discussed with Dr. Carlean Purl last night.  Abn LFTs likely d/t shock liver. LFTs trending down. Doubt Underlying significant cirrhosis (plt Nl) or acute cholecystitis. HIDA not done as she became combative. Refusing any WU. Was agitated/combative when I saw her but did tell me that she was not having abdo pain or N/V. Doubt hepatic encephalopathy Chronic pancreatitis d/t ETOH. No diarrhea R occipital CVA ?embolic  Sepsis d/t serratia bacteremia. Being worked up for potential endocarditis. But refusing any workup. On plavix/SQ heparin. Has elevated PT Protein cal malnutrition  Plan: -Trend LFTs. -No GI intervention planned. -Continue supportive treatment for now. -Resume creon once able to take PO. -Continue protonix 66m po qd. -Will sign off for now. Pl call if we could be on any help.  RCarmell Austria MD LVelora HecklerGI

## 2020-03-01 NOTE — Progress Notes (Signed)
Patient very agitated and upset. Patient shouting out to god and stating that there is a man in her room the died next to her two days ago. Staff attempted to console patient and assure her that no one else was present with no success. Dr. Candiss Norse made aware, 1mg  IV haldol ordered.

## 2020-03-01 NOTE — Discharge Instructions (Signed)

## 2020-03-01 NOTE — Plan of Care (Signed)
Did not require insulin coverage at bedtime.

## 2020-03-01 NOTE — Progress Notes (Signed)
Inpatient Diabetes Program Recommendations  AACE/ADA: New Consensus Statement on Inpatient Glycemic Control (2015)  Target Ranges:  Prepandial:   less than 140 mg/dL      Peak postprandial:   less than 180 mg/dL (1-2 hours)      Critically ill patients:  140 - 180 mg/dL   Results for FRANCEEN, ERISMAN (MRN [id number]) as of 03/01/2020 09:56  Ref. Range 02/29/2020 02:16 02/29/2020 08:03 02/29/2020 09:18 02/29/2020 14:11 02/29/2020 20:47  Glucose-Capillary Latest Ref Range: 70 - 99 mg/dL 80 68 (L) 149 (H) 84 87   Results for DERINDA, BARTUS (MRN [id number]) as of 03/01/2020 09:56  Ref. Range 03/01/2020 01:58 03/01/2020 04:58 03/01/2020 06:01  Glucose-Capillary Latest Ref Range: 70 - 99 mg/dL 135 (H)  1 unit NOVOLOG  68 (L) 100 (H)     HomeDM Meds: Lantus 80units Daily Humalog 14 unitsTID  Amaryl 8 mg Daily Farxiga 10 mg Daily  Current Orders: Lantus 20 units Daily                            Novolog Sensitive Correction Scale/ SSI (0-9 units) Q6 hours    NO Lantus has been given since 9pm on 10/28  Patient has been having occasional Hypoglycemic events off the Lantus  --May consider d/c of Lantus for now  --May also consider reduction of the Novolog SSi to the 0-6 unit scale (very sensitive)    --Will follow patient during hospitalization--  Wyn Quaker RN, MSN, CDE Diabetes Coordinator Inpatient Glycemic Control Team Team Pager: [phone] (8a-5p)

## 2020-03-01 NOTE — Progress Notes (Signed)
[facility] Progress Note     Subjective: CC-  Awake and alert. States that she wants something to eat. Otherwise no complaints. Denies abdominal pain, n/v. WBC 6.8, afebrile.  Objective: Vital signs in last 24 hours: Temp:  [97.5 F (36.4 C)-98.4 F (36.9 C)] 98.2 F (36.8 C) (11/01 0727) Pulse Rate:  [87-92] 89 (11/01 0727) Resp:  [17-22] 17 (11/01 0727) BP: (133-147)/(77-96) 144/84 (11/01 0727) SpO2:  [91 %-98 %] 94 % (11/01 0727)    Intake/Output from previous day: 10/31 0701 - 11/01 0700 In: -  Out: 1800 [Urine:1800] Intake/Output this shift: No intake/output data recorded.  PE: Gen:  Alert, NAD Card:  RRR Pulm:  rate and effort normal Abd: Soft, mild distension, nontender, +BS Skin: no rashes noted, warm and dry  Lab Results:  Recent Labs    02/29/20 0455 03/01/20 0500  WBC 7.7 6.8  HGB 9.9* 8.5*  HCT 32.2* 28.2*  PLT 378 350   BMET Recent Labs    02/29/20 0455 03/01/20 0500  NA 134* 135  K 4.7 3.3*  CL 104 120*  CO2 23 20*  GLUCOSE 80 64*  BUN 39* 27*  CREATININE 2.73* 1.87*  CALCIUM 7.6* 6.4*   PT/INR Recent Labs    02/29/20 0455 03/01/20 0500  LABPROT 22.8* 27.6*  INR 2.1* 2.7*   CMP     Component Value Date/Time   NA 135 03/01/2020 0500   NA 137 02/09/2020 0825   K 3.3 (L) 03/01/2020 0500   CL 120 (H) 03/01/2020 0500   CO2 20 (L) 03/01/2020 0500   GLUCOSE 64 (L) 03/01/2020 0500   BUN 27 (H) 03/01/2020 0500   BUN 14 02/09/2020 0825   CREATININE 1.87 (H) 03/01/2020 0500   CALCIUM 6.4 (LL) 03/01/2020 0500   PROT 5.4 (L) 03/01/2020 0500   ALBUMIN 1.2 (L) 03/01/2020 0500   AST 266 (H) 03/01/2020 0500   ALT 213 (H) 03/01/2020 0500   ALKPHOS 214 (H) 03/01/2020 0500   BILITOT 0.6 03/01/2020 0500   GFRNONAA 31 (L) 03/01/2020 0500   GFRAA 65 02/09/2020 0825   Lipase  No results found for: LIPASE     Studies/Results: CT HEAD WO CONTRAST  Result Date: 02/29/2020 CLINICAL DATA:  Delirium. EXAM: CT HEAD  WITHOUT CONTRAST TECHNIQUE: Contiguous axial images were obtained from the base of the skull through the vertex without intravenous contrast. COMPARISON:  December 17, 2016 FINDINGS: Brain: No subdural, epidural, or subarachnoid hemorrhage. Cerebellum, brainstem, and basal cisterns are normal. Ventricles and sulci are unremarkable. No mass effect or midline shift. Decreased attenuation involving the right temporal occipital cortex. There is also focal low-attenuation in the left occipital lobe on series 3, image 54. No other sites of infarct or ischemia identified. Vascular: Calcified atherosclerosis in the intracranial carotids. Skull: Normal. Negative for fracture or focal lesion. Sinuses/Orbits: No acute finding. Other: None. IMPRESSION: 1. Decreased attenuation in the right temporal occipital cortex and in the left occipital cortex. The findings are consistent with age-indeterminate infarcts but are favored to be subacute. MRI could better assess. The presence of probable subacute infarcts in 2 vascular territories should raise the possibility of an embolic cause. 2. No other acute abnormalities. 3. No other acute abnormalities. These results will be called to the ordering clinician or representative by the Radiologist Assistant, and communication documented in the PACS or [employer]. Electronically Signed   By: Dorise Bullion III M.D   On: 02/29/2020 10:33   MR ANGIO HEAD WO CONTRAST  Result Date: 02/29/2020 CLINICAL DATA:  Abnormal CT EXAM: MRI HEAD WITHOUT CONTRAST MRA HEAD WITHOUT CONTRAST MRA NECK WITHOUT CONTRAST TECHNIQUE: Multiplanar, multiecho pulse sequences of the brain and surrounding structures were obtained without intravenous contrast. Angiographic images of the Circle of Willis were obtained using MRA technique without intravenous contrast. Angiographic images of the neck were obtained using MRA technique without intravenous contrast. Carotid stenosis measurements (when applicable) are  obtained utilizing NASCET criteria, using the distal internal carotid diameter as the denominator. COMPARISON:  None. FINDINGS: MRI HEAD Brain: There is restricted diffusion in the lateral right occipital lobe with some parietotemporal extension reflecting acute infarction. Chronic left occipital infarct. Few additional patchy foci of T2 hyperintensity in the supratentorial and pontine white matter are nonspecific but probably reflect mild chronic microvascular ischemic changes. Foci of susceptibility are present in the posterior left lentiform nucleus, left thalamus, and midline cerebellum likely reflecting chronic microhemorrhages. There is no intracranial mass or significant mass effect. There is no hydrocephalus or extra-axial fluid collection. Vascular: Major vessel flow voids at the skull base are preserved. Skull and upper cervical spine: Normal marrow signal is preserved. Sinuses/Orbits: Paranasal sinuses are aerated. Orbits are unremarkable. Other: Sella is unremarkable.  Mastoid air cells are clear. MRA HEAD Intracranial internal carotid arteries are patent. Middle and anterior cerebral arteries are patent. Included intracranial vertebral arteries, basilar artery, posterior cerebral arteries are patent. There is no significant stenosis or aneurysm. MRA NECK Included portions of the common carotid arteries are patent. Internal and external carotid arteries are patent. There is no hemodynamically significant stenosis at the ICA origins by NASCET criteria. Included extracranial vertebral arteries are patent and codominant. IMPRESSION: Acute infarction of the lateral right occipital lobe with some parietotemporal extension. Chronic left occipital infarct. Mild chronic microvascular ischemic changes. No large vessel occlusion or hemodynamically significant stenosis. Electronically Signed   By: Macy Mis M.D.   On: 02/29/2020 13:40   MR ANGIO NECK WO CONTRAST  Result Date: 02/29/2020 CLINICAL DATA:   Abnormal CT EXAM: MRI HEAD WITHOUT CONTRAST MRA HEAD WITHOUT CONTRAST MRA NECK WITHOUT CONTRAST TECHNIQUE: Multiplanar, multiecho pulse sequences of the brain and surrounding structures were obtained without intravenous contrast. Angiographic images of the Circle of Willis were obtained using MRA technique without intravenous contrast. Angiographic images of the neck were obtained using MRA technique without intravenous contrast. Carotid stenosis measurements (when applicable) are obtained utilizing NASCET criteria, using the distal internal carotid diameter as the denominator. COMPARISON:  None. FINDINGS: MRI HEAD Brain: There is restricted diffusion in the lateral right occipital lobe with some parietotemporal extension reflecting acute infarction. Chronic left occipital infarct. Few additional patchy foci of T2 hyperintensity in the supratentorial and pontine white matter are nonspecific but probably reflect mild chronic microvascular ischemic changes. Foci of susceptibility are present in the posterior left lentiform nucleus, left thalamus, and midline cerebellum likely reflecting chronic microhemorrhages. There is no intracranial mass or significant mass effect. There is no hydrocephalus or extra-axial fluid collection. Vascular: Major vessel flow voids at the skull base are preserved. Skull and upper cervical spine: Normal marrow signal is preserved. Sinuses/Orbits: Paranasal sinuses are aerated. Orbits are unremarkable. Other: Sella is unremarkable.  Mastoid air cells are clear. MRA HEAD Intracranial internal carotid arteries are patent. Middle and anterior cerebral arteries are patent. Included intracranial vertebral arteries, basilar artery, posterior cerebral arteries are patent. There is no significant stenosis or aneurysm. MRA NECK Included portions of the common carotid arteries are patent. Internal and external carotid arteries are  patent. There is no hemodynamically significant stenosis at the ICA  origins by NASCET criteria. Included extracranial vertebral arteries are patent and codominant. IMPRESSION: Acute infarction of the lateral right occipital lobe with some parietotemporal extension. Chronic left occipital infarct. Mild chronic microvascular ischemic changes. No large vessel occlusion or hemodynamically significant stenosis. Electronically Signed   By: Macy Mis M.D.   On: 02/29/2020 13:40   MR BRAIN WO CONTRAST  Result Date: 02/29/2020 CLINICAL DATA:  Abnormal CT EXAM: MRI HEAD WITHOUT CONTRAST MRA HEAD WITHOUT CONTRAST MRA NECK WITHOUT CONTRAST TECHNIQUE: Multiplanar, multiecho pulse sequences of the brain and surrounding structures were obtained without intravenous contrast. Angiographic images of the Circle of Willis were obtained using MRA technique without intravenous contrast. Angiographic images of the neck were obtained using MRA technique without intravenous contrast. Carotid stenosis measurements (when applicable) are obtained utilizing NASCET criteria, using the distal internal carotid diameter as the denominator. COMPARISON:  None. FINDINGS: MRI HEAD Brain: There is restricted diffusion in the lateral right occipital lobe with some parietotemporal extension reflecting acute infarction. Chronic left occipital infarct. Few additional patchy foci of T2 hyperintensity in the supratentorial and pontine white matter are nonspecific but probably reflect mild chronic microvascular ischemic changes. Foci of susceptibility are present in the posterior left lentiform nucleus, left thalamus, and midline cerebellum likely reflecting chronic microhemorrhages. There is no intracranial mass or significant mass effect. There is no hydrocephalus or extra-axial fluid collection. Vascular: Major vessel flow voids at the skull base are preserved. Skull and upper cervical spine: Normal marrow signal is preserved. Sinuses/Orbits: Paranasal sinuses are aerated. Orbits are unremarkable. Other: Sella is  unremarkable.  Mastoid air cells are clear. MRA HEAD Intracranial internal carotid arteries are patent. Middle and anterior cerebral arteries are patent. Included intracranial vertebral arteries, basilar artery, posterior cerebral arteries are patent. There is no significant stenosis or aneurysm. MRA NECK Included portions of the common carotid arteries are patent. Internal and external carotid arteries are patent. There is no hemodynamically significant stenosis at the ICA origins by NASCET criteria. Included extracranial vertebral arteries are patent and codominant. IMPRESSION: Acute infarction of the lateral right occipital lobe with some parietotemporal extension. Chronic left occipital infarct. Mild chronic microvascular ischemic changes. No large vessel occlusion or hemodynamically significant stenosis. Electronically Signed   By: Macy Mis M.D.   On: 02/29/2020 13:40   US LIVER DOPPLER  Result Date: 03/01/2020 CLINICAL DATA:  Elevated LFTs.  Evaluate hepatic vasculature. EXAM: DUPLEX ULTRASOUND OF LIVER TECHNIQUE: Color and duplex Doppler ultrasound was performed to evaluate the hepatic in-flow and out-flow vessels. COMPARISON:  Right upper quadrant abdominal ultrasound-02/28/2020 FINDINGS: Liver: While there is preserved hepatic echogenicity, there is suspected mild nodularity hepatic contour. No discrete hepatic lesions. No intrahepatic biliary duct dilatation. Main Portal Vein size: 1.1 cm Portal Vein Velocities (normal hepatopetal directional flow) Main Prox:  21 cm/sec Main Mid: 21 cm/sec Main Dist:  23 cm/sec Right: 22 cm/sec Left: 13 cm/sec Hepatic Vein Velocities (normal hepatofugal directional flow) Right:  7 cm/sec Middle:  28 cm/sec Left:  20 cm/sec IVC: Present and patent with normal respiratory phasicity. Hepatic Artery Velocity:  58 cm/sec Splenic Vein Velocity:  31 cm/sec Spleen: 8.6 cm x 3.2 cm x 1.6 cm with a total volume of 23 cm^3 (411 cm^3 is upper limit normal) Portal Vein  Occlusion/Thrombus: No Splenic Vein Occlusion/Thrombus: No Ascites: None Varices: None IMPRESSION: 1. Patent hepatic vasculature with normal directional flow. 2. Suspected mild nodularity hepatic contour as could be seen in  the setting of early cirrhotic change without stigmata of portal venous hypertension, specifically, no evidence of splenomegaly or intra-abdominal ascites. No discrete hepatic lesions. Electronically Signed   By: Sandi Mariscal M.D.   On: 03/01/2020 08:45    Anti-infectives: Anti-infectives (From admission, onward)   Start     Dose/Rate Route Frequency Ordered Stop   02/28/20 0546  ceFEPIme (MAXIPIME) 2 g in sodium chloride 0.9 % 100 mL IVPB        2 g 200 mL/hr over 30 Minutes Intravenous Every 24 hours 02/27/20 1041     02/25/20 1400  metroNIDAZOLE (FLAGYL) tablet 500 mg  Status:  Discontinued        500 mg Oral Every 8 hours 02/25/20 1025 02/29/20 0911   02/24/20 1800  ceFEPIme (MAXIPIME) 2 g in sodium chloride 0.9 % 100 mL IVPB  Status:  Discontinued        2 g 200 mL/hr over 30 Minutes Intravenous Every 12 hours 02/24/20 0732 02/27/20 1041   02/24/20 0600  ceFEPIme (MAXIPIME) 2 g in sodium chloride 0.9 % 100 mL IVPB  Status:  Discontinued        2 g 200 mL/hr over 30 Minutes Intravenous Every 8 hours 02/24/20 0440 02/24/20 0732   02/23/20 1015  vancomycin (VANCOREADY) IVPB 1750 mg/350 mL        1,750 mg 175 mL/hr over 120 Minutes Intravenous  Once 02/23/20 1007 02/23/20 1347   02/23/20 1000  cefTRIAXone (ROCEPHIN) 2 g in sodium chloride 0.9 % 100 mL IVPB  Status:  Discontinued        2 g 200 mL/hr over 30 Minutes Intravenous Every 24 hours 02/23/20 0952 02/24/20 0440   02/23/20 1000  metroNIDAZOLE (FLAGYL) IVPB 500 mg  Status:  Discontinued        500 mg 100 mL/hr over 60 Minutes Intravenous Every 8 hours 02/23/20 0952 02/25/20 1025       Assessment/Plan Chronic systolic and diastolic CHF - echo 11/4130 EF 40-45% CKD II COPD not on home O2  OSA - BiPAP or CPAP  at home  DM2 - A1c this admission 15.1, poorly controlled, peripheral neuropathy HLD HTN PAD L femoropoliteal bypass and laser angioplasty LLE by Dr. Vella Redhead 10/14/19 Chronic non-healing L foot ulcer Serratia bacteremia   Elevated LFT's  - trending down: AST 266, ALT 213, Alk Phos 214, bili 0.6 Possible calculous cholecystitis vs. Acute liver injury - RUQ U/S with signs of possible cholecystitis - abdominal exam benign, WBC WNL, afebrile. Low suspicion for cholecystitis - HIDA pending, GI following   LOS: 7 days    Wellington [place], [facility] 03/01/2020, 9:15 AM Please see Amion for pager number during day hours 7:00am-4:30pm

## 2020-03-01 NOTE — Progress Notes (Signed)
Called by RN. Pt moaning in pain and cannot get comfortable. Reports pain is 9/10.  Also reports did not sleep last pm or during day and requests something to help her sleep tonight.  Given one dose IV dilaudid to help bring pain under control acutely. Oxycodone 5 mg q 6 hr for 4 doses to help manage pain going forward.  Ambien 5 mg to help sleep ordered.  RN to monitor.

## 2020-03-01 NOTE — Progress Notes (Deleted)
Left at pt bedside

## 2020-03-01 NOTE — Care Management Important Message (Signed)
Important Message  Patient Details  Name: Morgan Beasley MRN: [id number] Date of Birth: 1961/03/27   Medicare Important Message Given:  Yes     Orbie Pyo 03/01/2020, 4:22 PM

## 2020-03-01 NOTE — Progress Notes (Addendum)
Initial Nutrition Assessment  DOCUMENTATION CODES:   Obesity unspecified  INTERVENTION:  Once diet is advanced, recommend:  Ensure Enlive po TID, each supplement provides 350 kcal and 20 grams of protein  MVI daily  Will provide pt with diabetic diet education in discharge instructions  NUTRITION DIAGNOSIS:   Increased nutrient needs related to wound healing as evidenced by estimated needs.    GOAL:   Patient will meet greater than or equal to 90% of their needs    MONITOR:   PO intake, Supplement acceptance, Diet advancement, Skin, Labs, I & O's  REASON FOR ASSESSMENT:   Low Braden, NPO/Clear Liquid Diet    ASSESSMENT:   Pt admitted with septic shock 2/2  bacteremia from LLE cellulitis and nonhealing foot ulcer. PMH includes DM, CAD, CHF, HTN, PVD/PAD s/p angioplasty and stent placement.  Pt with new elevated LFTs since admission. Abdominal US revealed distended gallbladder with fluid in the gallbladder fossa sludge in the gallbladder neck. HIDA pending. Per MD: ? Acute cholecystitis vs component of shock liver/reactive 2/2 sepsis  Head CT also revealed subacute CVA. Neurology recommending fasting lipid panel, MRI head/neck check, frequent neuro checks, SLP evaluation. SLP recommended dysphagia 2 diet with thin liquids. Diet order has not yet been placed as pt is NPO for procedure.   Pt unavailable at time of RD visit. Per RN, pt is edentulous and her dentures were lost during a previous admission. Pt noted to be poor historian.   No meal documentation available throughout admission. Diet progression has been as follows: 10/25 NPO 10/26 Carb modified 10/27-10/30 Soft 10/30 Clear liquids 10/31-11/1 NPO  Recommend initiating oral nutrition supplement and multivitamin once pt's diet is advanced to aid in wound healing.   UOP: 1890ml x24 hours  Labs: K+ 3.3 (L), Mg 1.6 (L), Corrected Ca 8.64 (L), CBGs 135-68-100 Medications: ss Novolog, 20 units Lantus daily,  Creon TID, Protonix, IV 1g Ca gluconate x1  Diet Order:   Diet Order            Diet NPO time specified  Diet effective now                 EDUCATION NEEDS:   Education needs have been addressed  Skin:  Skin Assessment: Skin Integrity Issues: Skin Integrity Issues:: Other (Comment) Other: nonhealing wound L foot  Last BM:  unknown  Height:   Ht Readings from Last 1 Encounters:  02/23/20 5\' 6"  (1.676 m)    Weight:   Wt Readings from Last 1 Encounters:  02/23/20 90.3 kg    Ideal Body Weight:  59.09 kg  BMI:  Body mass index is 32.12 kg/m.  Estimated Nutritional Needs:   Kcal:  1950-2150  Protein:  100-120 grams  Fluid:  >/=1.95 L/d    Larkin Ina, MS, RD, LDN RD pager number and weekend/on-call pager number located in [place].

## 2020-03-02 ENCOUNTER — Encounter (HOSPITAL_COMMUNITY): Payer: Medicare Other

## 2020-03-02 DIAGNOSIS — I63431 Cerebral infarction due to embolism of right posterior cerebral artery: Secondary | ICD-10-CM | POA: Diagnosis not present

## 2020-03-02 DIAGNOSIS — A419 Sepsis, unspecified organism: Secondary | ICD-10-CM | POA: Diagnosis not present

## 2020-03-02 DIAGNOSIS — R6521 Severe sepsis with septic shock: Secondary | ICD-10-CM | POA: Diagnosis not present

## 2020-03-02 LAB — MAGNESIUM: Magnesium: 1.4 mg/dL — ABNORMAL LOW (ref 1.7–2.4)

## 2020-03-02 LAB — PROTIME-INR
INR: 2.1 — ABNORMAL HIGH (ref 0.8–1.2)
Prothrombin Time: 22.5 seconds — ABNORMAL HIGH (ref 11.4–15.2)

## 2020-03-02 LAB — COMPREHENSIVE METABOLIC PANEL
ALT: 152 U/L — ABNORMAL HIGH (ref 0–44)
AST: 121 U/L — ABNORMAL HIGH (ref 15–41)
Albumin: 1.1 g/dL — ABNORMAL LOW (ref 3.5–5.0)
Alkaline Phosphatase: 249 U/L — ABNORMAL HIGH (ref 38–126)
Anion gap: <3 — ABNORMAL LOW (ref 5–15)
BUN: 20 mg/dL (ref 6–20)
CO2: 18 mmol/L — ABNORMAL LOW (ref 22–32)
Calcium: 6.2 mg/dL — CL (ref 8.9–10.3)
Chloride: 124 mmol/L — ABNORMAL HIGH (ref 98–111)
Creatinine, Ser: 1.49 mg/dL — ABNORMAL HIGH (ref 0.44–1.00)
GFR, Estimated: 40 mL/min — ABNORMAL LOW (ref >60)
Glucose, Bld: 104 mg/dL — ABNORMAL HIGH (ref 70–99)
Potassium: 3.3 mmol/L — ABNORMAL LOW (ref 3.5–5.1)
Sodium: 135 mmol/L (ref 135–145)
Total Bilirubin: 0.6 mg/dL (ref 0.3–1.2)
Total Protein: 5.5 g/dL — ABNORMAL LOW (ref 6.5–8.1)

## 2020-03-02 LAB — BRAIN NATRIURETIC PEPTIDE: B Natriuretic Peptide: 1144.9 pg/mL — ABNORMAL HIGH (ref 0.0–100.0)

## 2020-03-02 LAB — CBC WITH DIFFERENTIAL/PLATELET
Abs Immature Granulocytes: 0.03 10*3/uL (ref 0.00–0.07)
Basophils Absolute: 0 10*3/uL (ref 0.0–0.1)
Basophils Relative: 0 %
Eosinophils Absolute: 0.1 10*3/uL (ref 0.0–0.5)
Eosinophils Relative: 2 %
HCT: 26.4 % — ABNORMAL LOW (ref 36.0–46.0)
Hemoglobin: 8.1 g/dL — ABNORMAL LOW (ref 12.0–15.0)
Immature Granulocytes: 1 %
Lymphocytes Relative: 22 %
Lymphs Abs: 1.3 10*3/uL (ref 0.7–4.0)
MCH: 25 pg — ABNORMAL LOW (ref 26.0–34.0)
MCHC: 30.7 g/dL (ref 30.0–36.0)
MCV: 81.5 fL (ref 80.0–100.0)
Monocytes Absolute: 0.7 10*3/uL (ref 0.1–1.0)
Monocytes Relative: 12 %
Neutro Abs: 3.7 10*3/uL (ref 1.7–7.7)
Neutrophils Relative %: 63 %
Platelets: 337 10*3/uL (ref 150–400)
RBC: 3.24 MIL/uL — ABNORMAL LOW (ref 3.87–5.11)
RDW: 19.1 % — ABNORMAL HIGH (ref 11.5–15.5)
WBC: 5.8 10*3/uL (ref 4.0–10.5)
nRBC: 3.8 % — ABNORMAL HIGH (ref 0.0–0.2)

## 2020-03-02 LAB — GLUCOSE, CAPILLARY
Glucose-Capillary: 133 mg/dL — ABNORMAL HIGH (ref 70–99)
Glucose-Capillary: 115 mg/dL — ABNORMAL HIGH (ref 70–99)
Glucose-Capillary: 117 mg/dL — ABNORMAL HIGH (ref 70–99)
Glucose-Capillary: 218 mg/dL — ABNORMAL HIGH (ref 70–99)
Glucose-Capillary: 217 mg/dL — ABNORMAL HIGH (ref 70–99)
Glucose-Capillary: 262 mg/dL — ABNORMAL HIGH (ref 70–99)

## 2020-03-02 MED ORDER — VITAMIN K1 10 MG/ML IJ SOLN
2.0000 mg | Freq: Once | INTRAVENOUS | Status: AC
  Administered 2020-03-02: 2 mg via INTRAVENOUS
  Filled 2020-03-02: qty 0.2

## 2020-03-02 MED ORDER — SODIUM CHLORIDE 0.9 % IV SOLN
2.0000 g | Freq: Two times a day (BID) | INTRAVENOUS | Status: DC
  Administered 2020-03-02 – 2020-03-18 (×31): 2 g via INTRAVENOUS
  Filled 2020-03-02 (×34): qty 2

## 2020-03-02 MED ORDER — CALCIUM GLUCONATE-NACL 1-0.675 GM/50ML-% IV SOLN
1.0000 g | Freq: Once | INTRAVENOUS | Status: AC
  Administered 2020-03-02: 1000 mg via INTRAVENOUS
  Filled 2020-03-02: qty 50

## 2020-03-02 MED ORDER — NICOTINE 7 MG/24HR TD PT24
7.0000 mg | MEDICATED_PATCH | Freq: Every day | TRANSDERMAL | Status: DC
  Administered 2020-03-03 – 2020-03-17 (×12): 7 mg via TRANSDERMAL
  Filled 2020-03-02 (×16): qty 1

## 2020-03-02 MED ORDER — MAGNESIUM SULFATE 4 GM/100ML IV SOLN
4.0000 g | Freq: Once | INTRAVENOUS | Status: AC
  Administered 2020-03-02: 4 g via INTRAVENOUS
  Filled 2020-03-02: qty 100

## 2020-03-02 MED ORDER — DIPHENHYDRAMINE HCL 25 MG PO CAPS
25.0000 mg | ORAL_CAPSULE | Freq: Once | ORAL | Status: AC
  Administered 2020-03-02: 25 mg via ORAL
  Filled 2020-03-02: qty 1

## 2020-03-02 MED ORDER — METOPROLOL TARTRATE 25 MG PO TABS: 25.0000 mg | ORAL_TABLET | Freq: Two times a day (BID) | ORAL | Status: DC

## 2020-03-02 MED ORDER — POTASSIUM CHLORIDE CRYS ER 20 MEQ PO TBCR
40.0000 meq | EXTENDED_RELEASE_TABLET | Freq: Once | ORAL | Status: AC
  Administered 2020-03-02: 40 meq via ORAL
  Filled 2020-03-02: qty 2

## 2020-03-02 NOTE — Progress Notes (Signed)
PROGRESS NOTE                                                                                                                                                                                                             Patient Demographics:    Morgan Beasley, is a 59 y.o. female, DOB - October 21, 1960, UKG:[id number]  Outpatient Primary MD for the patient is Cullop, [name], NP    LOS - 8  Admit date - 02/23/2020    Chief Complaint  Patient presents with  . Wound Infection       Brief Narrative (HPI from H&P)  - 59 year old female, lives alone but her daughter checks on her regularly, ambulates with the help of a walker with seat, PMH of chronic combined systolic and diastolic CHF, ongoing tobacco abuse, stage II CKD, COPD not on home oxygen, OSA on nightly BiPAP, poorly controlled DM2 with peripheral neuropathy, gout, HLD, HTN, prior substance abuse (THC, cocaine), PAD s/p L SFA stenting 08/2019 and left femoral to below-knee popliteal bypass 09/2019 with nonhealing left foot ulcer, mild CAD presented initially to the [facility] ED on 10/25 due to leg and foot pain.  Admitted for sepsis/Serratia bacteremia, left lower extremity cellulitis complicating chronic nonhealing ulcer, persistent lactic acidosis and concern for critical left lower extremity ischemia.  Vascular surgery was consulted and she was transferred from [facility] long hospital to [facility] under my care on 02/25/2020.    Her stay was complicated by an acute infarct for which neurology is following, also had AKI and shock liver due to hypotension.  Both getting better after hydration.  He was seen by GI for shock liver, general surgery for questionable cholecystitis on ultrasound.  Clinically this has been ruled out.   Subjective:   In bed wearing BiPAP mask, denies any headache, states she feels better, no chest or abdominal pain.  No shortness of breath.    Assessment  & Plan :    1.  Septic shock caused by Serratia bacteremia due to bilateral lower extremity PAD with chronic left lower extremity nonhealing ulcer in a diabetic patient.  She has been treated with IV fluids along with IV cefepime and oral Flagyl.  Vascular surgery following, sepsis pathophysiology seems to have improved, continue supportive care with antibiotics and wound care, continue hydration with IV fluids and  minimize narcotic use as much as possible.  Defer further management of PAD issues to vascular surgery.  2.  PAD s/p left femoropopliteal bypass in 10/19/2019, CAD.  On Plavix and statin for secondary prevention further assess #1 above.  3.   Incidental finding of reactive mediastinal hilar and right supraclavicular adenopathy.  Request PCP to arrange for outpatient biopsy.  4.  Dyslipidemia.  On statin and Zetia.  Will resume once liver function improves.  5.  Chronic narcotic use.  Mildly somnolent, Narcan as needed, reduce narcotics and use with caution, patient and daughter updated on 02/25/2020.  6.  COPD and smoking.  Counseled to quit smoking, supportive care, no wheezing.  7.  Obesity with OSA.  BMI of 32, follow with PCP, use BiPAP whenever she sleeps day or night.  High risk for CO2 retention.  8.  Essential hypertension.  Hold beta-blocker due to hypotension, IV fluids.  Allow for permissive hypertension due to acute stroke as a #15 below.  9.  Chronic combined systolic and diastolic CHF.  Echo done few months ago showing a EF of 40% with global hypokinesis.  Continue Plavix, statin and beta-blocker if tolerated by blood pressure (after 11/5 - New CVA)  for secondary prevention.  10.  AKI with CKD stage IIIa.  Baseline creatinine around 1.4, AKI worse due to a combination of IV contrast for CTA, dehydration and hypotension, hold beta-blocker, much improved after hydration with IV fluids.  11.  Anemia of chronic disease.  Mild drop due to heme dilution.   Monitor.  12. Poor IV access - PICC needed for IV  meds and ABX, CKD 3 A - benefits outweigh the risks.  13.  Elevated transaminases 02/27/2020.  Could be shock liver due to hypotension, she is completely pain and symptom-free, right upper quadrant ultrasound showed some nonspecific changes, seen by general surgery, also INR is high for which she will get vitamin K on 02/28/2020, acute hepatitis panel negative, right upper quadrant abdominal ultrasound nonacute, right upper quadrant liver Doppler stable, questionable mild early Nash on right upper quadrant ultrasound.  Repeat vitamin K on 03/01/2020.  Seen by [facility] and general surgery this admission.  HIDA pending.  14. Hypokalemia , hypocalcemia, hypomagnesemia -replaced we will continue to monitor.  15.  Acute right occipital infarct causing encephalopathy, mild left arm weakness, neuro following, full stroke work-up underway, will also order TEE as requested by neurology likely to be done on 03/04/2020, she has multiple risk factors including poor outpatient glycemic control with A1c of 15, ongoing smoking, severe PAD, LDL is above goal hence will resume statin once liver function improves, continue Plavix for now, counseled to quit smoking.  Neurology following.  16.DM 2, poor outpatient control due to hyperglycemia.  With A1c of 15, on Lantus and sliding scale monitor and adjust.  Lab Results  Component Value Date   HGBA1C 15.1 (H) 02/23/2020   CBG (last 3)  Recent Labs    03/01/20 2308 03/02/20 0128 03/02/20 0503  GLUCAP 131* 133* 115*   Lipid Panel     Component Value Date/Time   CHOL 116 03/01/2020 0805   CHOL 219 (H) 02/09/2020 0825   TRIG 83 03/01/2020 0805   HDL 22 (L) 03/01/2020 0805   HDL 53 02/09/2020 0825   CHOLHDL 5.3 03/01/2020 0805   VLDL 17 03/01/2020 0805   LDLCALC 77 03/01/2020 0805   LDLCALC 135 (H) 02/09/2020 0825   LABVLDL 31 02/09/2020 0825   Lab Results  Component  Value Date   INR 2.1 (H)  03/02/2020   INR 2.7 (H) 03/01/2020   INR 2.1 (H) 02/29/2020       Condition - Extremely Guarded  Family Communication  [name] - [phone] - on 02/25/20, bedside on 02/29/2020, updated daughter again over the phone on 03/02/2020  Code Status :  Full  Consults  :  VVS - Dr. Oneida Alar, general surgery, GI, Neuro  Procedures  :    TEE  HIDA  PICC line placement 02/25/2020.    MRI/A -  Acute infarction of the lateral right occipital lobe with some parietotemporal extension.  Chronic left occipital infarct. Mild chronic microvascular ischemic changes.  No large vessel occlusion or hemodynamically significant stenosis.  Leg [place] - No DVT  RUQ [place] - Mild distention of the gallbladder, gallbladder sludge, and pericholecystic fluid identified. Reportedly positive sonographic Murphy sign. Together, the findings are suspicious for changes of acute, calculus cholecystitis.  Liver Doppler -   1. Patent hepatic vasculature with normal directional flow. 2. Suspected mild nodularity hepatic contour as could be seen in the setting of early cirrhotic change without stigmata of portal venous hypertension, specifically, no evidence of splenomegaly or intra-abdominal ascites. No discrete hepatic lesions.   Renal US - 1. No hydronephrosis. 2. Mildly echogenic normal size kidneys, compatible with reported history of nonspecific acute renal parenchymal disease. 3. Normal bladder. 4. Incidentally noted nonspecific diffuse gallbladder wall thickening. If there is clinical concern for acute cholecystitis, dedicated right upper quadrant abdominal sonogram could be obtained for further evaluation  CTA -  IMPRESSION: 1. Patent left lower extremity femoral to below knee popliteal artery bypass graft. Evaluation of the left tibial vasculature is limited by calcifications, however there appears to be a 2 vessel runoff to the left ankle via the anterior tibial and posterior tibial arteries. 2. Progressive  peripheral vascular disease involving the right lower extremity. There is now complete occlusion of the mid to distal right superficial femoral artery, new since prior study. There are now appears to be a single vessel runoff to the right foot via the anterior tibial artery. In June, there was a 3 vessel runoff to the right foot. 3. Nonspecific, asymmetric bilateral lower extremity edema (left worse than right). This is of unknown clinical significance. This study cannot adequately exclude a DVT. If there is clinical suspicion for a DVT, follow-up with ultrasound is recommended. 4. No evidence for an acute pulmonary embolism. No evidence for thoracic aortic dissection or aneurysm. 5. Cardiomegaly with small to moderate-sized bilateral pleural effusions, right worse than left. 6. Persistent mediastinal and hilar adenopathy with worsening right supraclavicular adenopathy. Findings may be reactive or due to a lymphoproliferative disorder or metastatic disease. Follow-up is recommended. Of note, the right supraclavicular lymph node is amenable to percutaneous ultrasound-guided biopsy if clinically indicated. 7. Hepatomegaly with likely underlying hepatic steatosis. 8.  Aortic Atherosclerosis   PUD Prophylaxis : PPI  Disposition Plan  :    Status is: Inpatient  Remains inpatient appropriate because:Inpatient level of care appropriate due to severity of illness   Dispo: The patient is from: Home              Anticipated d/c is to: Home              Anticipated d/c date is: > 3 days              Patient currently is medically stable to d/c.   DVT Prophylaxis  :  Heparin  Lab Results  Component Value Date   PLT 337 03/02/2020    Diet :  Diet Order            DIET SOFT Room service appropriate? No; Fluid consistency: Thin  Diet effective now                  Inpatient Medications  Scheduled Meds: . Chlorhexidine Gluconate Cloth  6 each Topical Daily  . clopidogrel  75 mg Oral Daily  .  collagenase   Topical Daily  . feeding supplement  237 mL Oral TID BM  . fluticasone furoate-vilanterol  1 puff Inhalation Daily  . heparin injection (subcutaneous)  5,000 Units Subcutaneous Q8H  . insulin aspart  0-9 Units Subcutaneous Q6H  . insulin glargine  20 Units Subcutaneous Daily  . lipase/protease/amylase  72,000 Units Oral TID WC  . multivitamin with minerals  1 tablet Oral Daily  . pantoprazole  40 mg Oral Daily  . potassium chloride  40 mEq Oral Once   Continuous Infusions: . calcium gluconate 1,000 mg (03/02/20 0853)  . ceFEPime (MAXIPIME) IV    . lactated ringers Stopped (02/26/20 1041)  . lactated ringers Stopped (03/02/20 0900)  . magnesium sulfate bolus IVPB     PRN Meds:.acetaminophen **OR** [DISCONTINUED] acetaminophen, dextrose, dextrose, lipase/protease/amylase, naLOXone (NARCAN)  injection, [DISCONTINUED] ondansetron **OR** ondansetron (ZOFRAN) IV, oxyCODONE, polyethylene glycol, zolpidem  Antibiotics  :    Anti-infectives (From admission, onward)   Start     Dose/Rate Route Frequency Ordered Stop   03/02/20 1800  ceFEPIme (MAXIPIME) 2 g in sodium chloride 0.9 % 100 mL IVPB        2 g 200 mL/hr over 30 Minutes Intravenous Every 12 hours 03/02/20 0853     02/28/20 0546  ceFEPIme (MAXIPIME) 2 g in sodium chloride 0.9 % 100 mL IVPB  Status:  Discontinued        2 g 200 mL/hr over 30 Minutes Intravenous Every 24 hours 02/27/20 1041 03/02/20 0853   02/25/20 1400  metroNIDAZOLE (FLAGYL) tablet 500 mg  Status:  Discontinued        500 mg Oral Every 8 hours 02/25/20 1025 02/29/20 0911   02/24/20 1800  ceFEPIme (MAXIPIME) 2 g in sodium chloride 0.9 % 100 mL IVPB  Status:  Discontinued        2 g 200 mL/hr over 30 Minutes Intravenous Every 12 hours 02/24/20 0732 02/27/20 1041   02/24/20 0600  ceFEPIme (MAXIPIME) 2 g in sodium chloride 0.9 % 100 mL IVPB  Status:  Discontinued        2 g 200 mL/hr over 30 Minutes Intravenous Every 8 hours 02/24/20 0440 02/24/20 0732    02/23/20 1015  vancomycin (VANCOREADY) IVPB 1750 mg/350 mL        1,750 mg 175 mL/hr over 120 Minutes Intravenous  Once 02/23/20 1007 02/23/20 1347   02/23/20 1000  cefTRIAXone (ROCEPHIN) 2 g in sodium chloride 0.9 % 100 mL IVPB  Status:  Discontinued        2 g 200 mL/hr over 30 Minutes Intravenous Every 24 hours 02/23/20 0952 02/24/20 0440   02/23/20 1000  metroNIDAZOLE (FLAGYL) IVPB 500 mg  Status:  Discontinued        500 mg 100 mL/hr over 60 Minutes Intravenous Every 8 hours 02/23/20 0952 02/25/20 1025       Time Spent in minutes  30   Lala Lund M.D on 03/02/2020 at 9:31 AM  To page go to [email or web address] -  password [email or web address]  [phone]    See all Orders from today for further details    Objective:   Vitals:   03/01/20 2307 03/02/20 0309 03/02/20 0400 03/02/20 0750  BP: (!) 142/84 (!) 150/91  (!) 138/97  Pulse: 87 85    Resp: 20 20    Temp: 98 F (36.7 C) (!) 97.5 F (36.4 C) 98 F (36.7 C) 97.7 F (36.5 C)  TempSrc: Axillary Axillary Axillary Axillary  SpO2: 98% 95%    Weight:      Height:        Wt Readings from Last 3 Encounters:  02/23/20 90.3 kg  01/26/20 90.7 kg  01/16/20 93.9 kg     Intake/Output Summary (Last 24 hours) at 03/02/2020 0931 Last data filed at 03/02/2020 0310 Gross per 24 hour  Intake --  Output 950 ml  Net -950 ml     Physical Exam  Awake mildly confused, No new F.N deficits,  Mild L arm weakness [place].AT,PERRAL Supple Neck,No JVD, No cervical lymphadenopathy appriciated.  Symmetrical Chest wall movement, Good air movement bilaterally, CTAB RRR,No Gallops, Rubs or new Murmurs, No Parasternal Heave +ve B.Sounds, Abd Soft, No tenderness, No organomegaly appriciated, No rebound - guarding or rigidity.  lower extremity wounds as below.          Data Review:    CBC Recent Labs  Lab 02/27/20 0500 02/28/20 0500 02/29/20 0455 03/01/20 0500 03/02/20 0431  WBC 7.7 7.3 7.7 6.8 5.8  HGB  10.6* 8.5* 9.9* 8.5* 8.1*  HCT 35.0* 28.2* 32.2* 28.2* 26.4*  PLT 371 319 378 350 337  MCV 82.2 82.9 81.5 81.0 81.5  MCH 24.9* 25.0* 25.1* 24.4* 25.0*  MCHC 30.3 30.1 30.7 30.1 30.7  RDW 18.7* 18.8* 18.8* 19.1* 19.1*  LYMPHSABS 1.6 1.2 0.9 1.2 1.3  MONOABS 0.8 0.9 0.9 0.8 0.7  EOSABS 0.1 0.1 0.1 0.1 0.1  BASOSABS 0.0 0.0 0.0 0.0 0.0    Recent Labs  Lab  0000 02/24/20 1901 02/25/20 0254 02/26/20 0148 02/26/20 0343 02/26/20 0343 02/26/20 0344 02/27/20 0500 02/28/20 0500 02/29/20 0455 03/01/20 0500 03/02/20 0431  NA   < >  --  134*  --  134*   < >  --  133* 134* 134* 135 135  K   < >  --  4.1  --  5.4*   < >  --  4.1 2.9* 4.7 3.3* 3.3*  CL   < >  --  104  --  103   < >  --  101 122* 104 120* 124*  CO2   < >  --  21*  --  22   < >  --  22 18* 23 20* 18*  GLUCOSE   < >  --  136*  --  120*   < >  --  164* 121* 80 64* 104*  BUN   < >  --  22*  --  31*   < >  --  39* 33* 39* 27* 20  CREATININE   < >  --  1.80*  --  2.52*   < >  --  2.88* 2.38* 2.73* 1.87* 1.49*  CALCIUM   < >  --  7.6*  --  7.8*   < >  --  7.7* 5.8* 7.6* 6.4* 6.2*  AST  --   --   --   --  66*   < >  --  920* 829*  758* 266* 121*  ALT  --   --   --   --  24   < >  --  181* 235* 341* 213* 152*  ALKPHOS  --   --   --   --  137*   < >  --  184* 181* 290* 214* 249*  BILITOT  --   --   --   --  0.7   < >  --  0.4 0.6 0.6 0.6 0.6  ALBUMIN  --   --   --   --  1.6*   < >  --  1.6* 1.1* 1.4* 1.2* 1.1*  MG  --   --   --    < > 1.5*   < >  --  2.0 1.4* 2.3 1.6* 1.4*  PROCALCITON  --   --  0.77  --   --   --   --   --   --   --   --   --   LATICACIDVEN  --  2.1* 0.9  --   --   --  1.5  --   --   --   --   --   INR  --   --   --   --  1.7*  --   --   --  3.2* 2.1* 2.7* 2.1*  BNP  --   --   --   --  658.8*   < >  --  699.1* 510.0* 635.9* 808.8* 1,144.9*   < > = values in this interval not displayed.    ------------------------------------------------------------------------------------------------------------------ Recent  Labs    03/01/20 0805  CHOL 116  HDL 22*  LDLCALC 77  TRIG 83  CHOLHDL 5.3    Lab Results  Component Value Date   HGBA1C 15.1 (H) 02/23/2020   ------------------------------------------------------------------------------------------------------------------ No results for input(s): TSH, T4TOTAL, T3FREE, THYROIDAB in the last 72 hours.  Invalid input(s): FREET3  Cardiac Enzymes No results for input(s): CKMB, TROPONINI, MYOGLOBIN in the last 168 hours.  Invalid input(s): CK ------------------------------------------------------------------------------------------------------------------    Component Value Date/Time   BNP 1,144.9 (H) 03/02/2020 0431    Micro Results Recent Results (from the past 240 hour(s))  Blood Culture (routine x 2)     Status: Abnormal   Collection Time: 02/23/20  9:52 AM   Specimen: BLOOD  Result Value Ref Range Status   Specimen Description   Final    BLOOD RIGHT ANTECUBITAL Performed at [facility] [address] [place], [place] [address]    Special Requests   Final    BOTTLES DRAWN AEROBIC AND ANAEROBIC Blood Culture adequate volume Performed at [facility] [address], [place], [place] [address]    Culture  Setup Time   Final    GRAM NEGATIVE RODS IN BOTH AEROBIC AND ANAEROBIC BOTTLES CRITICAL RESULT CALLED TO, READ BACK BY AND VERIFIED WITH: Irwin Brakeman [id number] 02/24/2020 Mena Goes Performed at [facility], [place] [address], [place], [place] [address]    Culture SERRATIA MARCESCENS (A)  Final   Report Status 02/26/2020 FINAL  Final   Organism ID, Bacteria SERRATIA MARCESCENS  Final      Susceptibility   Serratia marcescens - MIC*    CEFAZOLIN >=64 RESISTANT Resistant     CEFEPIME <=0.12 SENSITIVE Sensitive     CEFTAZIDIME <=1 SENSITIVE Sensitive     CEFTRIAXONE <=0.25 SENSITIVE Sensitive     CIPROFLOXACIN <=0.25 SENSITIVE Sensitive  GENTAMICIN <=1 SENSITIVE Sensitive      TRIMETH/SULFA <=20 SENSITIVE Sensitive     * SERRATIA MARCESCENS  Blood Culture ID Panel (Reflexed)     Status: Abnormal   Collection Time: 02/23/20  9:52 AM  Result Value Ref Range Status   Enterococcus faecalis NOT DETECTED NOT DETECTED Final   Enterococcus Faecium NOT DETECTED NOT DETECTED Final   Listeria monocytogenes NOT DETECTED NOT DETECTED Final   Staphylococcus species NOT DETECTED NOT DETECTED Final   Staphylococcus aureus (BCID) NOT DETECTED NOT DETECTED Final   Staphylococcus epidermidis NOT DETECTED NOT DETECTED Final   Staphylococcus lugdunensis NOT DETECTED NOT DETECTED Final   Streptococcus species NOT DETECTED NOT DETECTED Final   Streptococcus agalactiae NOT DETECTED NOT DETECTED Final   Streptococcus pneumoniae NOT DETECTED NOT DETECTED Final   Streptococcus pyogenes NOT DETECTED NOT DETECTED Final   A.calcoaceticus-baumannii NOT DETECTED NOT DETECTED Final   Bacteroides fragilis NOT DETECTED NOT DETECTED Final   Enterobacterales DETECTED (A) NOT DETECTED Final    Comment: Enterobacterales represent a large order of gram negative bacteria, not a single organism. CRITICAL RESULT CALLED TO, READ BACK BY AND VERIFIED WITH: M. LILLISTON,PHARMD 0257 02/24/2020 T. TYSOR    Enterobacter cloacae complex NOT DETECTED NOT DETECTED Final   Escherichia coli NOT DETECTED NOT DETECTED Final   Klebsiella aerogenes NOT DETECTED NOT DETECTED Final   Klebsiella oxytoca NOT DETECTED NOT DETECTED Final   Klebsiella pneumoniae NOT DETECTED NOT DETECTED Final   Proteus species NOT DETECTED NOT DETECTED Final   Salmonella species NOT DETECTED NOT DETECTED Final   Serratia marcescens DETECTED (A) NOT DETECTED Final    Comment: CRITICAL RESULT CALLED TO, READ BACK BY AND VERIFIED WITH: M. LILLISTON,PHARMD 0257 02/24/2020 T. TYSOR    Haemophilus influenzae NOT DETECTED NOT DETECTED Final   Neisseria meningitidis NOT DETECTED NOT DETECTED Final   Pseudomonas aeruginosa NOT DETECTED NOT  DETECTED Final   Stenotrophomonas maltophilia NOT DETECTED NOT DETECTED Final   Candida albicans NOT DETECTED NOT DETECTED Final   Candida auris NOT DETECTED NOT DETECTED Final   Candida glabrata NOT DETECTED NOT DETECTED Final   Candida krusei NOT DETECTED NOT DETECTED Final   Candida parapsilosis NOT DETECTED NOT DETECTED Final   Candida tropicalis NOT DETECTED NOT DETECTED Final   Cryptococcus neoformans/gattii NOT DETECTED NOT DETECTED Final   CTX-M ESBL NOT DETECTED NOT DETECTED Final   Carbapenem resistance IMP NOT DETECTED NOT DETECTED Final   Carbapenem resistance KPC NOT DETECTED NOT DETECTED Final   Carbapenem resistance NDM NOT DETECTED NOT DETECTED Final   Carbapenem resist OXA 48 LIKE NOT DETECTED NOT DETECTED Final   Carbapenem resistance VIM NOT DETECTED NOT DETECTED Final    Comment: Performed at [facility], [place]. [address], [place], [place] [address]  Respiratory Panel by RT PCR (Flu A&B, Covid) - Nasopharyngeal Swab     Status: None   Collection Time: 02/23/20  9:55 AM   Specimen: Nasopharyngeal Swab  Result Value Ref Range Status   SARS Coronavirus 2 by RT PCR NEGATIVE NEGATIVE Final    Comment: (NOTE) SARS-CoV-2 target nucleic acids are NOT DETECTED.  The SARS-CoV-2 RNA is generally detectable in upper respiratoy specimens during the acute phase of infection. The lowest concentration of SARS-CoV-2 viral copies this assay can detect is 131 copies/mL. A negative result does not preclude SARS-Cov-2 infection and should not be used as the sole basis for treatment or other patient management decisions. A negative result may occur with  improper  specimen collection/handling, submission of specimen other than nasopharyngeal swab, presence of viral mutation(s) within the areas targeted by this assay, and inadequate number of viral copies (<131 copies/mL). A negative result must be combined with clinical observations, patient history, and epidemiological  information. The expected result is Negative.  Fact Sheet for Patients:  PinkCheek.be  Fact Sheet for Healthcare Providers:  GravelBags.it  This test is no t yet approved or cleared by the [place] FDA and  has been authorized for detection and/or diagnosis of SARS-CoV-2 by FDA under an Emergency Use Authorization (EUA). This EUA will remain  in effect (meaning this test can be used) for the duration of the COVID-19 declaration under Section 564(b)(1) of the Act, 21 U.S.C. section 360bbb-3(b)(1), unless the authorization is terminated or revoked sooner.     Influenza A by PCR NEGATIVE NEGATIVE Final   Influenza B by PCR NEGATIVE NEGATIVE Final    Comment: (NOTE) The Xpert Xpress SARS-CoV-2/FLU/RSV assay is intended as an aid in  the diagnosis of influenza from Nasopharyngeal swab specimens and  should not be used as a sole basis for treatment. Nasal washings and  aspirates are unacceptable for Xpert Xpress SARS-CoV-2/FLU/RSV  testing.  Fact Sheet for Patients: PinkCheek.be  Fact Sheet for Healthcare Providers: GravelBags.it  This test is not yet approved or cleared by the [place] FDA and  has been authorized for detection and/or diagnosis of SARS-CoV-2 by  FDA under an Emergency Use Authorization (EUA). This EUA will remain  in effect (meaning this test can be used) for the duration of the  Covid-19 declaration under Section 564(b)(1) of the Act, 21  U.S.C. section 360bbb-3(b)(1), unless the authorization is  terminated or revoked. Performed at [facility], [place] [address] [place], [place] [address]   Blood Culture (routine x 2)     Status: None   Collection Time: 02/23/20  9:57 AM   Specimen: BLOOD RIGHT FOREARM  Result Value Ref Range Status   Specimen Description   Final    BLOOD RIGHT FOREARM Performed at [facility], [place] [address] [place], [place] [address]    Special Requests   Final    BOTTLES DRAWN AEROBIC AND ANAEROBIC Blood Culture results may not be optimal due to an inadequate volume of blood received in culture bottles Performed at [facility] [address], [place], [place] [address]    Culture   Final    NO GROWTH 5 DAYS Performed at [facility], [place] [address] [place], [place] [address]    Report Status 02/28/2020 FINAL  Final  Urine culture     Status: Abnormal   Collection Time: 02/23/20 12:09 PM   Specimen: In/Out Cath Urine  Result Value Ref Range Status   Specimen Description   Final    IN/OUT CATH URINE Performed at [facility] [address], [place], [place] [address]    Special Requests   Final    NONE Performed at [facility], [place] [address] [place], [place] [address]    Culture (A)  Final    <10,000 COLONIES/mL INSIGNIFICANT GROWTH Performed at [facility] [address], [place], [place] [address]    Report Status 02/24/2020 FINAL  Final  MRSA PCR Screening     Status: None   Collection Time: 02/25/20  7:26 AM   Specimen: Nasal Mucosa; Nasopharyngeal  Result Value Ref Range Status   MRSA by PCR NEGATIVE NEGATIVE  Final    Comment:        The GeneXpert MRSA Assay (FDA approved for NASAL specimens only), is one component of a comprehensive MRSA colonization surveillance program. It is not intended to diagnose MRSA infection nor to guide or monitor treatment for MRSA infections. Performed at [facility], [place] [address] [place],  [address]     Radiology Reports CT HEAD WO CONTRAST  Result Date: 02/29/2020 CLINICAL DATA:  Delirium. EXAM: CT HEAD WITHOUT CONTRAST TECHNIQUE: Contiguous axial images were obtained from the base of the skull through the vertex without intravenous contrast. COMPARISON:  December 17, 2016 FINDINGS: Brain: No subdural, epidural, or  subarachnoid hemorrhage. Cerebellum, brainstem, and basal cisterns are normal. Ventricles and sulci are unremarkable. No mass effect or midline shift. Decreased attenuation involving the right temporal occipital cortex. There is also focal low-attenuation in the left occipital lobe on series 3, image 54. No other sites of infarct or ischemia identified. Vascular: Calcified atherosclerosis in the intracranial carotids. Skull: Normal. Negative for fracture or focal lesion. Sinuses/Orbits: No acute finding. Other: None. IMPRESSION: 1. Decreased attenuation in the right temporal occipital cortex and in the left occipital cortex. The findings are consistent with age-indeterminate infarcts but are favored to be subacute. MRI could better assess. The presence of probable subacute infarcts in 2 vascular territories should raise the possibility of an embolic cause. 2. No other acute abnormalities. 3. No other acute abnormalities. These results will be called to the ordering clinician or representative by the Radiologist Assistant, and communication documented in the PACS or [employer]. Electronically Signed   By: Dorise Bullion III M.D   On: 02/29/2020 10:33   MR ANGIO HEAD WO CONTRAST  Result Date: 02/29/2020 CLINICAL DATA:  Abnormal CT EXAM: MRI HEAD WITHOUT CONTRAST MRA HEAD WITHOUT CONTRAST MRA NECK WITHOUT CONTRAST TECHNIQUE: Multiplanar, multiecho pulse sequences of the brain and surrounding structures were obtained without intravenous contrast. Angiographic images of the Circle of Willis were obtained using MRA technique without intravenous contrast. Angiographic images of the neck were obtained using MRA technique without intravenous contrast. Carotid stenosis measurements (when applicable) are obtained utilizing NASCET criteria, using the distal internal carotid diameter as the denominator. COMPARISON:  None. FINDINGS: MRI HEAD Brain: There is restricted diffusion in the lateral right occipital lobe with  some parietotemporal extension reflecting acute infarction. Chronic left occipital infarct. Few additional patchy foci of T2 hyperintensity in the supratentorial and pontine white matter are nonspecific but probably reflect mild chronic microvascular ischemic changes. Foci of susceptibility are present in the posterior left lentiform nucleus, left thalamus, and midline cerebellum likely reflecting chronic microhemorrhages. There is no intracranial mass or significant mass effect. There is no hydrocephalus or extra-axial fluid collection. Vascular: Major vessel flow voids at the skull base are preserved. Skull and upper cervical spine: Normal marrow signal is preserved. Sinuses/Orbits: Paranasal sinuses are aerated. Orbits are unremarkable. Other: Sella is unremarkable.  Mastoid air cells are clear. MRA HEAD Intracranial internal carotid arteries are patent. Middle and anterior cerebral arteries are patent. Included intracranial vertebral arteries, basilar artery, posterior cerebral arteries are patent. There is no significant stenosis or aneurysm. MRA NECK Included portions of the common carotid arteries are patent. Internal and external carotid arteries are patent. There is no hemodynamically significant stenosis at the ICA origins by NASCET criteria. Included extracranial vertebral arteries are patent and codominant. IMPRESSION: Acute infarction of the lateral right occipital lobe with some parietotemporal extension. Chronic left occipital infarct. Mild chronic microvascular  ischemic changes. No large vessel occlusion or hemodynamically significant stenosis. Electronically Signed   By: Macy Mis M.D.   On: 02/29/2020 13:40   MR ANGIO NECK WO CONTRAST  Result Date: 02/29/2020 CLINICAL DATA:  Abnormal CT EXAM: MRI HEAD WITHOUT CONTRAST MRA HEAD WITHOUT CONTRAST MRA NECK WITHOUT CONTRAST TECHNIQUE: Multiplanar, multiecho pulse sequences of the brain and surrounding structures were obtained without  intravenous contrast. Angiographic images of the Circle of Willis were obtained using MRA technique without intravenous contrast. Angiographic images of the neck were obtained using MRA technique without intravenous contrast. Carotid stenosis measurements (when applicable) are obtained utilizing NASCET criteria, using the distal internal carotid diameter as the denominator. COMPARISON:  None. FINDINGS: MRI HEAD Brain: There is restricted diffusion in the lateral right occipital lobe with some parietotemporal extension reflecting acute infarction. Chronic left occipital infarct. Few additional patchy foci of T2 hyperintensity in the supratentorial and pontine white matter are nonspecific but probably reflect mild chronic microvascular ischemic changes. Foci of susceptibility are present in the posterior left lentiform nucleus, left thalamus, and midline cerebellum likely reflecting chronic microhemorrhages. There is no intracranial mass or significant mass effect. There is no hydrocephalus or extra-axial fluid collection. Vascular: Major vessel flow voids at the skull base are preserved. Skull and upper cervical spine: Normal marrow signal is preserved. Sinuses/Orbits: Paranasal sinuses are aerated. Orbits are unremarkable. Other: Sella is unremarkable.  Mastoid air cells are clear. MRA HEAD Intracranial internal carotid arteries are patent. Middle and anterior cerebral arteries are patent. Included intracranial vertebral arteries, basilar artery, posterior cerebral arteries are patent. There is no significant stenosis or aneurysm. MRA NECK Included portions of the common carotid arteries are patent. Internal and external carotid arteries are patent. There is no hemodynamically significant stenosis at the ICA origins by NASCET criteria. Included extracranial vertebral arteries are patent and codominant. IMPRESSION: Acute infarction of the lateral right occipital lobe with some parietotemporal extension. Chronic left  occipital infarct. Mild chronic microvascular ischemic changes. No large vessel occlusion or hemodynamically significant stenosis. Electronically Signed   By: Macy Mis M.D.   On: 02/29/2020 13:40   MR BRAIN WO CONTRAST  Result Date: 02/29/2020 CLINICAL DATA:  Abnormal CT EXAM: MRI HEAD WITHOUT CONTRAST MRA HEAD WITHOUT CONTRAST MRA NECK WITHOUT CONTRAST TECHNIQUE: Multiplanar, multiecho pulse sequences of the brain and surrounding structures were obtained without intravenous contrast. Angiographic images of the Circle of Willis were obtained using MRA technique without intravenous contrast. Angiographic images of the neck were obtained using MRA technique without intravenous contrast. Carotid stenosis measurements (when applicable) are obtained utilizing NASCET criteria, using the distal internal carotid diameter as the denominator. COMPARISON:  None. FINDINGS: MRI HEAD Brain: There is restricted diffusion in the lateral right occipital lobe with some parietotemporal extension reflecting acute infarction. Chronic left occipital infarct. Few additional patchy foci of T2 hyperintensity in the supratentorial and pontine white matter are nonspecific but probably reflect mild chronic microvascular ischemic changes. Foci of susceptibility are present in the posterior left lentiform nucleus, left thalamus, and midline cerebellum likely reflecting chronic microhemorrhages. There is no intracranial mass or significant mass effect. There is no hydrocephalus or extra-axial fluid collection. Vascular: Major vessel flow voids at the skull base are preserved. Skull and upper cervical spine: Normal marrow signal is preserved. Sinuses/Orbits: Paranasal sinuses are aerated. Orbits are unremarkable. Other: Sella is unremarkable.  Mastoid air cells are clear. MRA HEAD Intracranial internal carotid arteries are patent. Middle and anterior cerebral arteries are patent. Included intracranial vertebral arteries,  basilar artery,  posterior cerebral arteries are patent. There is no significant stenosis or aneurysm. MRA NECK Included portions of the common carotid arteries are patent. Internal and external carotid arteries are patent. There is no hemodynamically significant stenosis at the ICA origins by NASCET criteria. Included extracranial vertebral arteries are patent and codominant. IMPRESSION: Acute infarction of the lateral right occipital lobe with some parietotemporal extension. Chronic left occipital infarct. Mild chronic microvascular ischemic changes. No large vessel occlusion or hemodynamically significant stenosis. Electronically Signed   By: Macy Mis M.D.   On: 02/29/2020 13:40   CT ANGIO AO+BIFEM W & OR WO CONTRAST  Result Date: 02/24/2020 CLINICAL DATA:  Chest pain.  Peripheral vascular disease. EXAM: CT ANGIOGRAPHY OF ABDOMINAL AORTA WITH ILIOFEMORAL RUNOFF CT ANGIOGRAPHY OF ABDOMINAL AORTA WITH ILIOFEMORAL RUNOFF TECHNIQUE: Multidetector CT imaging of the abdomen, pelvis and lower extremities was performed using the standard protocol during bolus administration of intravenous contrast. Multiplanar CT image reconstructions and MIPs were obtained to evaluate the vascular anatomy. Multidetector CT imaging of the abdomen, pelvis and lower extremities was performed using the standard protocol during bolus administration of intravenous contrast. Multiplanar CT image reconstructions and MIPs were obtained to evaluate the vascular anatomy. CONTRAST:  118mL OMNIPAQUE IOHEXOL 350 MG/ML SOLN COMPARISON:  CT dated October 09, 2019. CT PE study dated 02/25/2019 FINDINGS: CHEST Cardiovascular: There is no evidence for large centrally located pulmonary embolism. Detection of smaller pulmonary emboli is limited by technique. There is no evidence for a thoracic aortic dissection or aneurysm. The heart size is enlarged. There is no significant pericardial effusion. There are mild coronary artery calcifications. Mediastinum/Nodes:  --there are enlarged mediastinal lymph nodes. For example there is a pretracheal lymph node measuring approximately 1.8 x 2.9 cm (axial series 7, image 40). This is relatively similar to prior study. --mild hilar adenopathy is noted. -- No axillary lymphadenopathy. --there is significant right supraclavicular adenopathy which has progressed since the prior study. The dominant lymph node measures 1.2 by 3 point 4 cm (axial series 7, image 15). -- Normal thyroid gland where visualized. -  Unremarkable esophagus. Lungs/Pleura: There is a moderate to large right-sided pleural effusion. There is a small left-sided pleural effusion. There is bibasilar atelectasis. There is no pneumothorax. Musculoskeletal: No chest wall abnormality. No bony spinal canal stenosis. Review of the MIP images confirms the above findings. VASCULAR Aorta: There are atherosclerotic changes of the abdominal aorta without evidence for an aneurysm. Celiac: Patent without evidence of aneurysm, dissection, vasculitis or significant stenosis. SMA: Patent without evidence of aneurysm, dissection, vasculitis or significant stenosis. Renals: Both renal arteries are patent without evidence of aneurysm, dissection, vasculitis, fibromuscular dysplasia or significant stenosis. IMA: Patent without evidence of aneurysm, dissection, vasculitis or significant stenosis. RIGHT Lower Extremity Inflow: Common, internal and external iliac arteries are patent without evidence of aneurysm, dissection, vasculitis or significant stenosis. Outflow: The right common femoral artery is patent. The right SFA is occluded proximally. There is near immediate reconstitution. There are tandem high-grade areas of stenosis throughout the right SFA. There is new complete occlusion of the mid to distal right SFA (axial series 7, image 247). Evaluation of the right popliteal artery is limited, however there appears to be high-grade stenosis throughout the vessel. Runoff: There is a  high-grade stenosis of the proximal right anterior tibial artery. There appears to be a single vessel runoff to the right lower extremity via the anterior tibial artery. LEFT Lower Extremity Inflow: Common, internal and external iliac arteries are patent  without evidence of aneurysm, dissection, vasculitis or significant stenosis. Outflow: The left CFA is widely patent. The proximal left SFA is entirely occluded. There is a new left femoral to below knee popliteal artery bypass graft. The graft is widely patent throughout its course. The profunda femoris artery is patent. Runoff: Heavy calcifications limit evaluation of the tibial vasculature. However there appears to be a 2 vessel runoff via the anterior tibial and posterior tibial arteries. The peroneal artery is occluded. Veins: The veins are not well evaluated on this study. Review of the MIP images confirms the above findings. NON-VASCULAR Hepatobiliary: There is hepatomegaly with likely underlying hepatic steatosis. There appears to be gallbladder wall thickening with pericholecystic free fluid.There is no biliary ductal dilation. Pancreas: Normal contours without ductal dilatation. No peripancreatic fluid collection. Spleen: Unremarkable. Adrenals/Urinary Tract: --Adrenal glands: Unremarkable. --Right kidney/ureter: No hydronephrosis or radiopaque kidney stones. --Left kidney/ureter: No hydronephrosis or radiopaque kidney stones. --Urinary bladder: Unremarkable. Stomach/Bowel: --Stomach/Duodenum: No hiatal hernia or other gastric abnormality. Normal duodenal course and caliber. --Small bowel: Unremarkable. --Colon: Unremarkable. --Appendix: Normal. Lymphatic: --No retroperitoneal lymphadenopathy. --No mesenteric lymphadenopathy. --there is mild bilateral inguinal adenopathy, left worse than right. Reproductive: Unremarkable Other: No ascites or free air. The abdominal wall is normal. Musculoskeletal. There are postsurgical changes of the left inguinal region.  There is bilateral nonspecific lower extremity edema, left worse than right. There are small bilateral suprapatellar joint effusions. IMPRESSION: 1. Patent left lower extremity femoral to below knee popliteal artery bypass graft. Evaluation of the left tibial vasculature is limited by calcifications, however there appears to be a 2 vessel runoff to the left ankle via the anterior tibial and posterior tibial arteries. 2. Progressive peripheral vascular disease involving the right lower extremity. There is now complete occlusion of the mid to distal right superficial femoral artery, new since prior study. There are now appears to be a single vessel runoff to the right foot via the anterior tibial artery. In June, there was a 3 vessel runoff to the right foot. 3. Nonspecific, asymmetric bilateral lower extremity edema (left worse than right). This is of unknown clinical significance. This study cannot adequately exclude a DVT. If there is clinical suspicion for a DVT, follow-up with ultrasound is recommended. 4. No evidence for an acute pulmonary embolism. No evidence for thoracic aortic dissection or aneurysm. 5. Cardiomegaly with small to moderate-sized bilateral pleural effusions, right worse than left. 6. Persistent mediastinal and hilar adenopathy with worsening right supraclavicular adenopathy. Findings may be reactive or due to a lymphoproliferative disorder or metastatic disease. Follow-up is recommended. Of note, the right supraclavicular lymph node is amenable to percutaneous ultrasound-guided biopsy if clinically indicated. 7. Hepatomegaly with likely underlying hepatic steatosis. 8.  Aortic Atherosclerosis (ICD10-I70.0). Electronically Signed   By: Constance Holster M.D.   On: 02/24/2020 18:13   US RENAL  Result Date: 02/26/2020 CLINICAL DATA:  Acute kidney injury. EXAM: RENAL / URINARY TRACT ULTRASOUND COMPLETE COMPARISON:  CT angiogram of the abdomen and pelvis from 02/24/2020. FINDINGS: Right  Kidney: Renal measurements: 11.2 x 4.8 x 6.0 cm = volume: 169 mL. Mildly echogenic renal parenchyma, normal thickness. No hydronephrosis. No renal mass. Left Kidney: Renal measurements: 12.2 x 5.4 x 5.6 cm = volume: 184 mL. Mildly echogenic renal parenchyma, normal thickness. No hydronephrosis. No renal mass. Bladder: Appears normal for degree of bladder distention. Other: Incidentally noted diffuse gallbladder wall thickening. IMPRESSION: 1. No hydronephrosis. 2. Mildly echogenic normal size kidneys, compatible with reported history of nonspecific acute renal parenchymal disease.  3. Normal bladder. 4. Incidentally noted nonspecific diffuse gallbladder wall thickening. If there is clinical concern for acute cholecystitis, dedicated right upper quadrant abdominal sonogram could be obtained for further evaluation. Electronically Signed   By: Ilona Sorrel M.D.   On: 02/26/2020 10:20   DG Chest Port 1 View  Result Date: 02/27/2020 CLINICAL DATA:  Short of breath EXAM: PORTABLE CHEST 1 VIEW COMPARISON:  02/26/2020 FINDINGS: Cardiac enlargement and vascular congestion. Mild improvement in bilateral airspace disease consistent with edema. No significant effusion. Left arm PICC tip in the SVC IMPRESSION: Congestive heart failure with mild interval improvement in edema. Electronically Signed   By: Franchot Gallo M.D.   On: 02/27/2020 08:02   DG Chest Port 1 View  Result Date: 02/26/2020 CLINICAL DATA:  Shortness of breath; history breast cancer, CHF, chronic kidney disease, COPD, diabetes mellitus, hypertension EXAM: PORTABLE CHEST 1 VIEW COMPARISON:  Portable exam 0729 hours compared to 02/25/2020 FINDINGS: LEFT arm PICC line tip projects over SVC. Enlargement of cardiac silhouette. Mediastinal contours normal. BILATERAL pulmonary infiltrates identified, basilar predominance, favor multifocal pneumonia over pulmonary edema. Subsegmental atelectasis lower LEFT lung. Question minimal LEFT pleural effusion. No  pneumothorax. Endplate spur formation thoracic spine. IMPRESSION: Persistent BILATERAL pulmonary infiltrates, predominantly basilar, favoring multifocal pneumonia. Electronically Signed   By: Lavonia Dana M.D.   On: 02/26/2020 07:56   DG Chest Port 1 View  Result Date: 02/25/2020 CLINICAL DATA:  Shortness of breath, on BiPAP EXAM: PORTABLE CHEST 1 VIEW COMPARISON:  Portable exam at 1039 hrs compared to 02/23/2020 FINDINGS: Enlargement of cardiac silhouette with pulmonary vascular congestion. Diffuse infiltrates throughout both lungs, greater at the lower lungs bilaterally, question multifocal pneumonia pulmonary edema considered less likely. No pleural effusion or pneumothorax. Osseous structures unremarkable IMPRESSION: Enlargement of cardiac silhouette with slight pulmonary vascular congestion. Diffuse BILATERAL pulmonary infiltrates greater at lower lungs bilaterally, favor multifocal pneumonia or less likely pulmonary edema. Electronically Signed   By: Lavonia Dana M.D.   On: 02/25/2020 10:49   DG Chest Port 1 View  Result Date: 02/23/2020 CLINICAL DATA:  Concern for sepsis EXAM: PORTABLE CHEST 1 VIEW COMPARISON:  10/09/2019 FINDINGS: Increased bibasilar airspace opacities worse on the left obscuring the left hemidiaphragm concerning for bibasilar pneumonia. Stable cardiomegaly. No CHF pattern or large effusion. No pneumothorax. Aorta atherosclerotic. IMPRESSION: Bibasilar airspace process, worse on the left concerning for pneumonia. Electronically Signed   By: Jerilynn Mages.  Shick M.D.   On: 02/23/2020 10:24   DG Foot 2 Views Left  Result Date: 02/23/2020 CLINICAL DATA:  Chronic wound EXAM: LEFT FOOT - 2 VIEW COMPARISON:  Sep 21, 2019 FINDINGS: Frontal and lateral views were obtained. There is soft tissue swelling. There is suggestion of soft tissue air medial to the distal aspect of the first metatarsal. There is no erosive change or osteomyelitis. No fracture or dislocation. Joint spaces overall appear  unremarkable. There are prominent posterior and inferior calcaneal spurs. IMPRESSION: Soft tissue swelling with soft tissue air medial to the mid to distal first metatarsal. No bony destruction or erosion. No appreciable joint space narrowing. There are calcaneal spurs. No fracture or dislocation evident. Electronically Signed   By: Lowella Grip III M.D.   On: 02/23/2020 10:22   CT ANGIO CHEST AORTA W/CM &/OR WO/CM  Result Date: 02/24/2020 CLINICAL DATA:  Chest pain.  Peripheral vascular disease. EXAM: CT ANGIOGRAPHY OF ABDOMINAL AORTA WITH ILIOFEMORAL RUNOFF CT ANGIOGRAPHY OF ABDOMINAL AORTA WITH ILIOFEMORAL RUNOFF TECHNIQUE: Multidetector CT imaging of the abdomen, pelvis and lower  extremities was performed using the standard protocol during bolus administration of intravenous contrast. Multiplanar CT image reconstructions and MIPs were obtained to evaluate the vascular anatomy. Multidetector CT imaging of the abdomen, pelvis and lower extremities was performed using the standard protocol during bolus administration of intravenous contrast. Multiplanar CT image reconstructions and MIPs were obtained to evaluate the vascular anatomy. CONTRAST:  115mL OMNIPAQUE IOHEXOL 350 MG/ML SOLN COMPARISON:  CT dated October 09, 2019. CT PE study dated 02/25/2019 FINDINGS: CHEST Cardiovascular: There is no evidence for large centrally located pulmonary embolism. Detection of smaller pulmonary emboli is limited by technique. There is no evidence for a thoracic aortic dissection or aneurysm. The heart size is enlarged. There is no significant pericardial effusion. There are mild coronary artery calcifications. Mediastinum/Nodes: --there are enlarged mediastinal lymph nodes. For example there is a pretracheal lymph node measuring approximately 1.8 x 2.9 cm (axial series 7, image 40). This is relatively similar to prior study. --mild hilar adenopathy is noted. -- No axillary lymphadenopathy. --there is significant right  supraclavicular adenopathy which has progressed since the prior study. The dominant lymph node measures 1.2 by 3 point 4 cm (axial series 7, image 15). -- Normal thyroid gland where visualized. -  Unremarkable esophagus. Lungs/Pleura: There is a moderate to large right-sided pleural effusion. There is a small left-sided pleural effusion. There is bibasilar atelectasis. There is no pneumothorax. Musculoskeletal: No chest wall abnormality. No bony spinal canal stenosis. Review of the MIP images confirms the above findings. VASCULAR Aorta: There are atherosclerotic changes of the abdominal aorta without evidence for an aneurysm. Celiac: Patent without evidence of aneurysm, dissection, vasculitis or significant stenosis. SMA: Patent without evidence of aneurysm, dissection, vasculitis or significant stenosis. Renals: Both renal arteries are patent without evidence of aneurysm, dissection, vasculitis, fibromuscular dysplasia or significant stenosis. IMA: Patent without evidence of aneurysm, dissection, vasculitis or significant stenosis. RIGHT Lower Extremity Inflow: Common, internal and external iliac arteries are patent without evidence of aneurysm, dissection, vasculitis or significant stenosis. Outflow: The right common femoral artery is patent. The right SFA is occluded proximally. There is near immediate reconstitution. There are tandem high-grade areas of stenosis throughout the right SFA. There is new complete occlusion of the mid to distal right SFA (axial series 7, image 247). Evaluation of the right popliteal artery is limited, however there appears to be high-grade stenosis throughout the vessel. Runoff: There is a high-grade stenosis of the proximal right anterior tibial artery. There appears to be a single vessel runoff to the right lower extremity via the anterior tibial artery. LEFT Lower Extremity Inflow: Common, internal and external iliac arteries are patent without evidence of aneurysm, dissection,  vasculitis or significant stenosis. Outflow: The left CFA is widely patent. The proximal left SFA is entirely occluded. There is a new left femoral to below knee popliteal artery bypass graft. The graft is widely patent throughout its course. The profunda femoris artery is patent. Runoff: Heavy calcifications limit evaluation of the tibial vasculature. However there appears to be a 2 vessel runoff via the anterior tibial and posterior tibial arteries. The peroneal artery is occluded. Veins: The veins are not well evaluated on this study. Review of the MIP images confirms the above findings. NON-VASCULAR Hepatobiliary: There is hepatomegaly with likely underlying hepatic steatosis. There appears to be gallbladder wall thickening with pericholecystic free fluid.There is no biliary ductal dilation. Pancreas: Normal contours without ductal dilatation. No peripancreatic fluid collection. Spleen: Unremarkable. Adrenals/Urinary Tract: --Adrenal glands: Unremarkable. --Right kidney/ureter: No hydronephrosis or  radiopaque kidney stones. --Left kidney/ureter: No hydronephrosis or radiopaque kidney stones. --Urinary bladder: Unremarkable. Stomach/Bowel: --Stomach/Duodenum: No hiatal hernia or other gastric abnormality. Normal duodenal course and caliber. --Small bowel: Unremarkable. --Colon: Unremarkable. --Appendix: Normal. Lymphatic: --No retroperitoneal lymphadenopathy. --No mesenteric lymphadenopathy. --there is mild bilateral inguinal adenopathy, left worse than right. Reproductive: Unremarkable Other: No ascites or free air. The abdominal wall is normal. Musculoskeletal. There are postsurgical changes of the left inguinal region. There is bilateral nonspecific lower extremity edema, left worse than right. There are small bilateral suprapatellar joint effusions. IMPRESSION: 1. Patent left lower extremity femoral to below knee popliteal artery bypass graft. Evaluation of the left tibial vasculature is limited by  calcifications, however there appears to be a 2 vessel runoff to the left ankle via the anterior tibial and posterior tibial arteries. 2. Progressive peripheral vascular disease involving the right lower extremity. There is now complete occlusion of the mid to distal right superficial femoral artery, new since prior study. There are now appears to be a single vessel runoff to the right foot via the anterior tibial artery. In June, there was a 3 vessel runoff to the right foot. 3. Nonspecific, asymmetric bilateral lower extremity edema (left worse than right). This is of unknown clinical significance. This study cannot adequately exclude a DVT. If there is clinical suspicion for a DVT, follow-up with ultrasound is recommended. 4. No evidence for an acute pulmonary embolism. No evidence for thoracic aortic dissection or aneurysm. 5. Cardiomegaly with small to moderate-sized bilateral pleural effusions, right worse than left. 6. Persistent mediastinal and hilar adenopathy with worsening right supraclavicular adenopathy. Findings may be reactive or due to a lymphoproliferative disorder or metastatic disease. Follow-up is recommended. Of note, the right supraclavicular lymph node is amenable to percutaneous ultrasound-guided biopsy if clinically indicated. 7. Hepatomegaly with likely underlying hepatic steatosis. 8.  Aortic Atherosclerosis (ICD10-I70.0). Electronically Signed   By: Constance Holster M.D.   On: 02/24/2020 18:13   ECHOCARDIOGRAM COMPLETE  Result Date: 03/01/2020    ECHOCARDIOGRAM REPORT   Patient Name:   KAMBRIE EDDLEMAN [facility] Date of Exam: 03/01/2020 Medical Rec #:  [id number]            Height:       66.0 in Accession #:    [id number]           Weight:       199.0 lb Date of Birth:  November 21, 1960             BSA:          1.996 m Patient Age:    42 years             BP:           144/84 mmHg Patient Gender: F                    HR:           89 bpm. Exam Location:  Inpatient Procedure: 2D Echo, Cardiac  Doppler and Color Doppler Indications:    Stroke 434.91 / I163.9  History:        Patient has prior history of Echocardiogram examinations, most                 recent 10/13/2019. CHF, COPD; Risk Factors:Hypertension,                 Dyslipidemia and Diabetes.  Sonographer:    Bernadene Person RDCS Referring Phys: [facility]  Sonographer  Comments: Patient refused half way through exam IMPRESSIONS  1. Unable to fully assess EF and wall motion as patient terminated the exam early and we needed definity contrast to delineate the LV endocardium. Based on limited visualization, the patient appears to have mildly-to-moderately reduced LV systolic function with LVEF ~40%. On short axis views, the anteior and septal walls appear moderately hypokinetic. Again, definity contrast would help delineate this further as visualization is very limited.  2. Left ventricular diastolic parameters are consistent with Grade II diastolic dysfunction (pseudonormalization). Elevated left atrial pressure.  3. Right ventricular systolic function is at least mildly reduced. Patient terminated exam prior to full evaluation of RV systolic function. The right ventricular size is mildly enlarged.  4. The mitral valve is normal in structure. Trivial mitral valve regurgitation.  5. The aortic valve is tricuspid. There is mild thickening of the aortic valve. Aortic valve regurgitation is trivial. Comparison(s): Compared to prior TTE in 09/22/19, this exam is much more limited, however, LVEF appears to have decreased to ~40%. Conclusion(s)/Recommendation(s): No intracardiac source of embolism detected on this transthoracic study. A transesophageal echocardiogram is recommended to exclude cardiac source of embolism if clinically indicated. FINDINGS  Left Ventricle: Unable to fully assess EF and wall motion as patient terminated the exam early and we needed definity contrast to delineate the LV endocardium. Based on limited visualization, the  patient appears to have mildly-to-moderately reduced LV systolic function with LVEF ~40%. On short axis views, the anteior and septal walls appear moderately hypokinetic. The left ventricular internal cavity size was normal in size. There is borderline concentric left ventricular hypertrophy. Left ventricular  diastolic parameters are consistent with Grade II diastolic dysfunction (pseudonormalization). Elevated left atrial pressure. The E/e' is 21.5. Again, definity contrast would help delineate this further as visualization is very limited. Right Ventricle: The right ventricular size is mildly enlarged. Right vetricular wall thickness was not well visualized. Right ventricular systolic function is at least mildly reduced. Test terminated by the patient prior to full evaluation of RV systolic function. Left Atrium: Left atrial size was normal in size. Right Atrium: Right atrial size was normal in size. Pericardium: There is no evidence of pericardial effusion. Mitral Valve: The mitral valve is normal in structure. There is mild thickening of the mitral valve leaflet(s). There is mild calcification of the mitral valve leaflet(s). Trivial mitral valve regurgitation. Tricuspid Valve: The tricuspid valve is normal in structure. Tricuspid valve regurgitation is trivial. Aortic Valve: The aortic valve is tricuspid. There is mild thickening of the aortic valve. Aortic valve regurgitation is trivial. Pulmonic Valve: The pulmonic valve was normal in structure. Pulmonic valve regurgitation is trivial. Aorta: The aortic root and ascending aorta are structurally normal, with no evidence of dilitation. IAS/Shunts: The atrial septum is grossly normal.  LEFT VENTRICLE PLAX 2D LVIDd:         5.10 cm  Diastology LVIDs:         3.70 cm  LV e' medial:    4.70 cm/s LV PW:         0.90 cm  LV E/e' medial:  21.5 LV IVS:        0.80 cm  LV e' lateral:   5.59 cm/s LVOT diam:     2.00 cm  LV E/e' lateral: 18.1 LV SV:         41 LV SV  Index:   20 LVOT Area:     3.14 cm  LEFT ATRIUM  Index       RIGHT ATRIUM           Index LA diam:      4.10 cm 2.05 cm/m  RA Area:     18.20 cm LA Vol (A4C): 65.7 ml 32.92 ml/m RA Volume:   54.80 ml  27.46 ml/m  AORTIC VALVE LVOT Vmax:   77.30 cm/s LVOT Vmean:  51.400 cm/s LVOT VTI:    0.129 m  AORTA Ao Root diam: 2.70 cm Ao Asc diam:  2.90 cm MITRAL VALVE                TRICUSPID VALVE MV Area (PHT): 5.38 cm     TR Peak grad:   17.5 mmHg MV Decel Time: 141 msec     TR Vmax:        209.00 cm/s MV E velocity: 101.00 cm/s MV A velocity: 71.80 cm/s   SHUNTS MV E/A ratio:  1.41         Systemic VTI:  0.13 m                             Systemic Diam: 2.00 cm Gwyndolyn Kaufman MD Electronically signed by Gwyndolyn Kaufman MD Signature Date/Time: 03/01/2020/11:55:54 AM    Final    US LIVER DOPPLER  Result Date: 03/01/2020 CLINICAL DATA:  Elevated LFTs.  Evaluate hepatic vasculature. EXAM: DUPLEX ULTRASOUND OF LIVER TECHNIQUE: Color and duplex Doppler ultrasound was performed to evaluate the hepatic in-flow and out-flow vessels. COMPARISON:  Right upper quadrant abdominal ultrasound-02/28/2020 FINDINGS: Liver: While there is preserved hepatic echogenicity, there is suspected mild nodularity hepatic contour. No discrete hepatic lesions. No intrahepatic biliary duct dilatation. Main Portal Vein size: 1.1 cm Portal Vein Velocities (normal hepatopetal directional flow) Main Prox:  21 cm/sec Main Mid: 21 cm/sec Main Dist:  23 cm/sec Right: 22 cm/sec Left: 13 cm/sec Hepatic Vein Velocities (normal hepatofugal directional flow) Right:  7 cm/sec Middle:  28 cm/sec Left:  20 cm/sec IVC: Present and patent with normal respiratory phasicity. Hepatic Artery Velocity:  58 cm/sec Splenic Vein Velocity:  31 cm/sec Spleen: 8.6 cm x 3.2 cm x 1.6 cm with a total volume of 23 cm^3 (411 cm^3 is upper limit normal) Portal Vein Occlusion/Thrombus: No Splenic Vein Occlusion/Thrombus: No Ascites: None Varices: None IMPRESSION:  1. Patent hepatic vasculature with normal directional flow. 2. Suspected mild nodularity hepatic contour as could be seen in the setting of early cirrhotic change without stigmata of portal venous hypertension, specifically, no evidence of splenomegaly or intra-abdominal ascites. No discrete hepatic lesions. Electronically Signed   By: Sandi Mariscal M.D.   On: 03/01/2020 08:45   VAS [place] LOWER EXTREMITY VENOUS (DVT)  Result Date: 02/25/2020  Lower Venous DVTStudy Indications: Edema.  Risk Factors: Surgery 01-26-2020 LT balloon angioplasty, 10-14-2019 LT fem-pop bypass graft using non-reversed great saphenous vein. Limitations: Body habitus, poor ultrasound/tissue interface and patient intolerant to probe pressure. Comparison Study: No prior studies. Performing Technologist: Darlin Coco  Examination Guidelines: A complete evaluation includes B-mode imaging, spectral Doppler, color Doppler, and power Doppler as needed of all accessible portions of each vessel. Bilateral testing is considered an integral part of a complete examination. Limited examinations for reoccurring indications may be performed as noted. The reflux portion of the exam is performed with the patient in reverse Trendelenburg.  +-----+---------------+---------+-----------+----------+--------------+ RIGHTCompressibilityPhasicitySpontaneityPropertiesThrombus Aging +-----+---------------+---------+-----------+----------+--------------+ CFV  Full           Yes  Yes                                 +-----+---------------+---------+-----------+----------+--------------+   +---------+---------------+---------+-----------+----------+---------------+ LEFT     CompressibilityPhasicitySpontaneityPropertiesThrombus Aging  +---------+---------------+---------+-----------+----------+---------------+ CFV      Full           Yes      Yes                                   +---------+---------------+---------+-----------+----------+---------------+ SFJ      Full                                                         +---------+---------------+---------+-----------+----------+---------------+ FV Prox  Full                                                         +---------+---------------+---------+-----------+----------+---------------+ FV Mid                  Yes      Yes                  Patent by color +---------+---------------+---------+-----------+----------+---------------+ FV Distal               Yes      Yes                  Patent by color +---------+---------------+---------+-----------+----------+---------------+ PFV      Full                                                         +---------+---------------+---------+-----------+----------+---------------+ POP      Full           Yes      Yes                                  +---------+---------------+---------+-----------+----------+---------------+ PTV      Full                                                         +---------+---------------+---------+-----------+----------+---------------+ PERO                    Yes      Yes                  Patent by color +---------+---------------+---------+-----------+----------+---------------+     Summary: RIGHT: - No evidence of common femoral vein obstruction.  LEFT: - There is no evidence of deep vein thrombosis in the lower extremity. However, portions of this examination were limited- see technologist comments above.  - No cystic structure  found in the popliteal fossa.  *See table(s) above for measurements and observations. Electronically signed by Harold Barban MD on 02/25/2020 at 8:54:52 PM.    Final    [place] EKG SITE RITE  Result Date: 02/25/2020 If Site Rite image not attached, placement could not be confirmed due to current cardiac rhythm.  US Abdomen Limited RUQ (LIVER/GB)  Result Date:  02/28/2020 CLINICAL DATA:  Transaminitis, EXAM: ULTRASOUND ABDOMEN LIMITED RIGHT UPPER QUADRANT COMPARISON:  None. FINDINGS: Gallbladder: The gallbladder is mildly distended and there is pericholecystic fluid identified within the a gallbladder fossa. A small amount of layering sludge is seen within the gallbladder neck. There is no associated gallbladder wall thickening. The sonographic Percell Miller sign is reportedly positive. Common bile duct: Diameter: 3 mm Liver: Liver parenchymal echogenicity and echotexture is normal. No focal intrahepatic masses are seen and there is no intrahepatic biliary ductal dilation. Portal vein is patent on color Doppler imaging with normal direction of blood flow towards the liver. Other: No ascites IMPRESSION: Mild distention of the gallbladder, gallbladder sludge, and pericholecystic fluid identified. Reportedly positive sonographic Murphy sign. Together, the findings are suspicious for changes of acute, calculus cholecystitis. Electronically Signed   By: Fidela Salisbury MD   On: 02/28/2020 01:23

## 2020-03-02 NOTE — Progress Notes (Signed)
Occupational Therapy Treatment Patient Details Name: Morgan Beasley MRN: [id number] DOB: [birth year] Today's Date: 03/02/2020    History of present illness Pt is a 59 y.o. female admitted 02/23/20 with LLE pain; lab work consistent with sepsis. Also with LLE cellulitis. 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   OT comments  Patient in recliner upon entry, finishing session with PT. Patient engaged in ADL tasks from recliner, min assist for grooming, mod assist for LB bathing, and min assist for donning new gown.  Patient requires constant redirection to task and task completion, cueing for safety and difficulty following commands/problem solving.   Patient reports "seeing a man back there", re-assured no one else was in the room.  Question L sided visual deficits, but poor ability to follow multiple step commands and fatigues easily. Mumbled speech at times, difficult to understand.  Will follow acutely, will update goals.    Follow Up Recommendations  SNF;Supervision/Assistance - 24 hour    Equipment Recommendations  Other (comment) (TBD)    Recommendations for Other Services      Precautions / Restrictions Precautions Precautions: Fall Precaution Comments: left foot wound       Mobility Bed Mobility               General bed mobility comments: pt in recliner upon entry   Transfers Overall transfer level: Needs assistance Equipment used: 4-wheeled walker Transfers: Sit to/from Stand Sit to Stand: Min assist         General transfer comment: min assist to power up and steady from recliner, cueing for hand placement and safety     Balance Overall balance assessment: Needs  assistance Sitting-balance support: No upper extremity supported;Feet supported Sitting balance-Leahy Scale: Fair Sitting balance - Comments: decreased dynamic tolerance but statically supervision, preference to UE support    Standing balance support: Bilateral upper extremity supported;During functional activity Standing balance-Leahy Scale: Poor Standing balance comment: 1 UE support during ADL task, but preference to BUE support                           ADL either performed or assessed with clinical judgement   ADL Overall ADL's : Needs assistance/impaired     Grooming: Sitting;Minimal assistance;Wash/dry face       Lower Body Bathing: Moderate assistance;Sit to/from stand Lower Body Bathing Details (indicate cue type and reason): requires assist for lower legs, buttocks; pt able to stand with 1 UE to wash peri area  Upper Body Dressing : Minimal assistance;Sitting Upper Body Dressing Details (indicate cue type and reason): to don new gown                 Functional mobility during ADLs: Minimal assistance;Rolling walker;Cueing for safety General ADL Comments: pt limited by cognition, weakness, balance      Vision   Vision Assessment?: Yes Additional Comments: brief assessment completed, pt able to scan and locate # of fingers on R side, inconsistent on L side but noted poor attention and decreased ability to follow multiple step commands    Perception     Praxis      Cognition Arousal/Alertness: Awake/alert Behavior During Therapy: Flat affect Overall Cognitive Status: Impaired/Different from  baseline Area of Impairment: Orientation;Following commands;Problem solving;Awareness;Memory;Attention;Safety/judgement                 Orientation Level: Disoriented to;Time Current Attention Level: Sustained Memory: Decreased short-term memory Following Commands: Follows one step commands inconsistently;Follows one step commands with increased  time Safety/Judgement: Decreased awareness of safety;Decreased awareness of deficits Awareness: Emergent Problem Solving: Slow processing;Decreased initiation;Difficulty sequencing;Requires verbal cues;Requires tactile cues General Comments: patient disoriented to time, requires increased time for processing and following simple commands; mulitmodal cueing required for task completion         Exercises     Shoulder Instructions       General Comments      Pertinent Vitals/ Pain       Pain Assessment: Faces Pain Score: 4  Faces Pain Scale: Hurts little more Pain Location: feet hurt, back itches Pain Descriptors / Indicators: Sore Pain Intervention(s): Limited activity within patient's tolerance;Monitored during session;Repositioned  Home Living                                          Prior Functioning/Environment              Frequency  Min 2X/week        Progress Toward Goals  OT Goals(current goals can now be found in the care plan section)  Progress towards OT goals: Progressing toward goals  Acute Rehab OT Goals Patient Stated Goal: none stated OT Goal Formulation: With patient  Plan Discharge plan remains appropriate;Frequency remains appropriate    Co-evaluation                 AM-PAC OT "6 Clicks" Daily Activity     Outcome Measure   Help from another person eating meals?: A Little Help from another person taking care of personal grooming?: A Little Help from another person toileting, which includes using toliet, bedpan, or urinal?: Total Help from another person bathing (including washing, rinsing, drying)?: A Lot Help from another person to put on and taking off regular upper body clothing?: A Little Help from another person to put on and taking off regular lower body clothing?: A Lot 6 Click Score: 14    End of Session Equipment Utilized During Treatment: Other (comment) (rollator)  OT Visit Diagnosis: Other  abnormalities of gait and mobility (R26.89);Muscle weakness (generalized) (M62.81);Pain;Other symptoms and signs involving cognitive function Pain - Right/Left: Left Pain - part of body: Leg;Ankle and joints of foot   Activity Tolerance Patient tolerated treatment well   Patient Left in chair;with call bell/phone within reach;with chair alarm set   Nurse Communication Mobility status;Other (comment) (needing new purewick)        Time: 4765-4650 OT Time Calculation (min): 19 min  Charges: OT General Charges $OT Visit: 1 Visit OT Treatments $Self Care/Home Management : 8-22 mins  Jolaine Artist, OT Acute Rehabilitation Services Pager [phone] Office [phone]    Morgan Beasley 03/02/2020, 2:51 PM

## 2020-03-02 NOTE — Progress Notes (Signed)
Patient still reporting itching all over. Notified MD, new order received.

## 2020-03-02 NOTE — Progress Notes (Signed)
PHARMACY NOTE:  ANTIMICROBIAL RENAL DOSAGE ADJUSTMENT  Current antimicrobial regimen includes a mismatch between antimicrobial dosage and estimated renal function.  As per policy approved by the Pharmacy & Therapeutics and Medical Executive Committees, the antimicrobial dosage will be adjusted accordingly.  Current antimicrobial dosage:  Cefepime 2gm IV q24h  Indication: bacteremia  Renal Function: SCr / CrCl has improved.  Now near baseline of 1.4.  Estimated Creatinine Clearance: 46.6 mL/min (A) (by C-G formula based on SCr of 1.49 mg/dL (H)). []      On intermittent HD, scheduled: []      On CRRT    Antimicrobial dosage has been changed to:  Cefepime 2gm IV q12h  Additional comments:   Thank you for allowing pharmacy to be a part of this patient's care.  Elmer Ramp, [facility] 03/02/2020 8:53 AM

## 2020-03-02 NOTE — Progress Notes (Signed)
Physical Therapy Treatment Patient Details Name: Morgan Beasley MRN: [id number] DOB: May 29, 1960 Today's Date: 03/02/2020    History of Present Illness Pt is a 59 y.o. female admitted 02/23/20 with LLE pain; lab work consistent with sepsis. Also with LLE cellulitis. 79 /31 pt with acute hallucinations and AMS found to have Rt occipital CVA with parietotemporal extension and chronic Left occipital CVAPMH includes CHF, CKD III, COPD, DM, neuropathy, HTN, CAD, OSA, PAD, tobacco use, alcohol dependence, anxiety, gout, breast CA, R foot ulcer, LLE vascular intervention in 2021 (most recently 01/26/20).    PT Comments    Pt in recliner on arrival with RLE hanging off foot rest. Pt stating she is frustrated with all the changes then speaking about a brother's nephew. My with impaired attention, problem solving, safety awareness and impulsivity with decline in cognition compared to deficits of last session 10/29. Pt with report of dizziness in standing and with gait limiting function coupled with cognitive impairments. D/C plan and goals remain appropriate.  On arrival pt PICC line dressing hanging open and RN present to provide dressing during session.    Follow Up Recommendations  SNF;Supervision/Assistance - 24 hour     Equipment Recommendations  3in1 (PT)    Recommendations for Other Services       Precautions / Restrictions Precautions Precautions: Fall Precaution Comments: left foot wound    Mobility  Bed Mobility               General bed mobility comments: pt in recliner on arrival and end of session  Transfers Overall transfer level: Needs assistance   Transfers: Sit to/from Stand Sit to Stand: Min assist         General transfer comment: min assist with cues to rise from recliner and from straight back chair. 3 trials total. Pt sitting prematurely at chair due to dizziness with assist to clear armrest. Max +2 assist to scoot back in chair as pt would not follow  commands to complete scooting  Ambulation/Gait Ambulation/Gait assistance: Min assist;+2 safety/equipment Gait Distance (Feet): 8 Feet Assistive device: 4-wheeled walker     Gait velocity interpretation: 1.31 - 2.62 ft/sec, indicative of limited community ambulator General Gait Details: assist for balance, pt not appropriately directing rollator and sitting prematurely with agitation with assist and cues to correct for safety. Pt walked 8' x 2 limited due to dizziness with pt sitting quickly and not stating dizziness until after already sitting   Stairs             Wheelchair Mobility    Modified Rankin (Stroke Patients Only)       Balance Overall balance assessment: Needs assistance Sitting-balance support: No upper extremity supported;Feet supported Sitting balance-Leahy Scale: Fair Sitting balance - Comments: pt sitting in chair with armrest with tendency to prop on arms and unable to fully assess sitting   Standing balance support: Bilateral upper extremity supported Standing balance-Leahy Scale: Poor Standing balance comment: bil UE support on rollator during standing                            Cognition Arousal/Alertness: Awake/alert Behavior During Therapy: Flat affect Overall Cognitive Status: Impaired/Different from baseline Area of Impairment: Orientation;Following commands;Problem solving;Awareness;Memory;Attention;Safety/judgement                 Orientation Level: Disoriented to;Time Current Attention Level: Sustained Memory: Decreased short-term memory Following Commands: Follows one step commands inconsistently;Follows one step commands with increased  time Safety/Judgement: Decreased awareness of safety;Decreased awareness of deficits Awareness: Emergent Problem Solving: Slow processing;Decreased initiation;Difficulty sequencing;Requires verbal cues;Requires tactile cues General Comments: pt with decreased attention, agitated with  repeated cues, perseverating on back scratching and getting cleaned up despite education for timing of pericare      Exercises      General Comments        Pertinent Vitals/Pain Pain Assessment: Faces Pain Score: 4  Faces Pain Scale: Hurts even more Pain Location: feet hurt, back itches Pain Descriptors / Indicators: Sore Pain Intervention(s): Limited activity within patient's tolerance;Monitored during session;Repositioned    Home Living                      Prior Function            PT Goals (current goals can now be found in the care plan section) Progress towards PT goals: Progressing toward goals    Frequency    Min 3X/week      PT Plan Current plan remains appropriate    Co-evaluation              AM-PAC PT "6 Clicks" Mobility   Outcome Measure  Help needed turning from your back to your side while in a flat bed without using bedrails?: A Lot Help needed moving from lying on your back to sitting on the side of a flat bed without using bedrails?: A Lot Help needed moving to and from a bed to a chair (including a wheelchair)?: A Little Help needed standing up from a chair using your arms (e.g., wheelchair or bedside chair)?: A Little Help needed to walk in hospital room?: A Lot Help needed climbing 3-5 steps with a railing? : Total 6 Click Score: 13    End of Session Equipment Utilized During Treatment: Gait belt Activity Tolerance: Patient tolerated treatment well Patient left: in chair;with call bell/phone within reach;with chair alarm set;Other (comment) (with OT) Nurse Communication: Mobility status PT Visit Diagnosis: Other abnormalities of gait and mobility (R26.89);Pain;Muscle weakness (generalized) (M62.81)     Time: 1121-1200 PT Time Calculation (min) (ACUTE ONLY): 39 min  Charges:  $Gait Training: 8-22 mins $Therapeutic Activity: 8-22 mins                     Angeliah Wisdom P, PT Acute Rehabilitation Services Pager:  [phone] Office: [place] 03/02/2020, 1:55 PM

## 2020-03-02 NOTE — Progress Notes (Signed)
Patient reporting itching all over her body. Notified MD, new order received.

## 2020-03-02 NOTE — Progress Notes (Addendum)
  Speech Language Pathology Treatment: Dysphagia;Cognitive-Linquistic  Patient Details Name: Morgan Beasley MRN: [id number] DOB: [birth year] Today's Date: 03/02/2020 Time: 7001-7494 SLP Time Calculation (min) (ACUTE ONLY): 13 min  Assessment / Plan / Recommendation Clinical Impression  Pt up in chair with lunch tray - had eaten a few bites of roast beef without oral residue observed. She declined additional meat or vegetables but consumption of sugar cookie was unremarkable as were straw sips water (limited trials consumed). No pharyngeal concerns during observation. Swallow goals met. Continue with soft textures due to endentulous state.  Moderate verbal cues required to sustain attention to meal- easily distracted by noise in hall, lines, pain. Exhibited need for assist to problem solve basic hospital situations re: obtaining help, turning on tv etc. Increasing volume improved her intelligibility in conversation to 60% however needs frequent reminders.    HPI HPI: Pt is a 59 y.o. female with past medical history of prior substance abuse (THC, cocaine),  obesity, poorly controlled diabetes mellitus, CAD, systolic/diastolic CHFongoing tobacco abuse, stage II CKD, COPD not on home oxygen, OSA on nightly BiPAP, and hypertension plus PVD/PAD status post angioplasty and stent placement in the past and left femoral to below-knee popliteal bypass 09/2019 with nonhealing left foot ulcer who presented the ED with c/o left leg and foot pain but was a poor historian at the time.  CT head was done for encephalopathy and revealed a subacute right occipital infarct.  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      SLP Plan  Other (Comment) (goals )       Recommendations  Diet recommendations: Thin liquid;Other(comment) (soft) Liquids provided via: Cup;Straw Medication Administration: Whole meds with puree Supervision: Patient able to self  feed;Intermittent supervision to cue for compensatory strategies Compensations: Slow rate;Small sips/bites Postural Changes and/or Swallow Maneuvers: Seated upright 90 degrees                Oral Care Recommendations: Oral care BID Follow up Recommendations: 24 hour supervision/assistance SLP Visit Diagnosis: Dysphagia, unspecified (R13.10);Cognitive communication deficit (R41.841) Plan: Other (Comment) (goals )                       Houston Siren 03/02/2020, 1:26 PM  Orbie Pyo Colvin Caroli.Ed Risk analyst [phone] Office [phone]

## 2020-03-02 NOTE — Progress Notes (Signed)
[facility] Progress Note     Subjective: CC-  Patient refused HIDA yesterday. Up in chair eating breakfast this morning. Denies abdominal pain, nausea, vomiting. WBC 5.8, afebrile. LFTs trending down.  Objective: Vital signs in last 24 hours: Temp:  [97.5 F (36.4 C)-98.4 F (36.9 C)] 97.7 F (36.5 C) (11/02 0750) Pulse Rate:  [85-97] 85 (11/02 0309) Resp:  [18-20] 20 (11/02 0309) BP: (135-150)/(84-97) 138/97 (11/02 0750) SpO2:  [95 %-100 %] 95 % (11/02 0309)    Intake/Output from previous day: 11/01 0701 - 11/02 0700 In: -  Out: 950 [Urine:950] Intake/Output this shift: No intake/output data recorded.  PE: Gen:  Alert, NAD Card:  RRR Pulm:  rate and effort normal Abd: Soft, mild distension, nontender, +BS Skin: no rashes noted, warm and dry  Lab Results:  Recent Labs    03/01/20 0500 03/02/20 0431  WBC 6.8 5.8  HGB 8.5* 8.1*  HCT 28.2* 26.4*  PLT 350 337   BMET Recent Labs    03/01/20 0500 03/02/20 0431  NA 135 135  K 3.3* 3.3*  CL 120* 124*  CO2 20* 18*  GLUCOSE 64* 104*  BUN 27* 20  CREATININE 1.87* 1.49*  CALCIUM 6.4* 6.2*   PT/INR Recent Labs    03/01/20 0500 03/02/20 0431  LABPROT 27.6* 22.5*  INR 2.7* 2.1*   CMP     Component Value Date/Time   NA 135 03/02/2020 0431   NA 137 02/09/2020 0825   K 3.3 (L) 03/02/2020 0431   CL 124 (H) 03/02/2020 0431   CO2 18 (L) 03/02/2020 0431   GLUCOSE 104 (H) 03/02/2020 0431   BUN 20 03/02/2020 0431   BUN 14 02/09/2020 0825   CREATININE 1.49 (H) 03/02/2020 0431   CALCIUM 6.2 (LL) 03/02/2020 0431   PROT 5.5 (L) 03/02/2020 0431   ALBUMIN 1.1 (L) 03/02/2020 0431   AST 121 (H) 03/02/2020 0431   ALT 152 (H) 03/02/2020 0431   ALKPHOS 249 (H) 03/02/2020 0431   BILITOT 0.6 03/02/2020 0431   GFRNONAA 40 (L) 03/02/2020 0431   GFRAA 65 02/09/2020 0825   Lipase  No results found for: LIPASE     Studies/Results: CT HEAD WO CONTRAST  Result Date: 02/29/2020 CLINICAL DATA:   Delirium. EXAM: CT HEAD WITHOUT CONTRAST TECHNIQUE: Contiguous axial images were obtained from the base of the skull through the vertex without intravenous contrast. COMPARISON:  December 17, 2016 FINDINGS: Brain: No subdural, epidural, or subarachnoid hemorrhage. Cerebellum, brainstem, and basal cisterns are normal. Ventricles and sulci are unremarkable. No mass effect or midline shift. Decreased attenuation involving the right temporal occipital cortex. There is also focal low-attenuation in the left occipital lobe on series 3, image 54. No other sites of infarct or ischemia identified. Vascular: Calcified atherosclerosis in the intracranial carotids. Skull: Normal. Negative for fracture or focal lesion. Sinuses/Orbits: No acute finding. Other: None. IMPRESSION: 1. Decreased attenuation in the right temporal occipital cortex and in the left occipital cortex. The findings are consistent with age-indeterminate infarcts but are favored to be subacute. MRI could better assess. The presence of probable subacute infarcts in 2 vascular territories should raise the possibility of an embolic cause. 2. No other acute abnormalities. 3. No other acute abnormalities. These results will be called to the ordering clinician or representative by the Radiologist Assistant, and communication documented in the PACS or [employer]. Electronically Signed   By: Dorise Bullion III M.D   On: 02/29/2020 10:33   MR ANGIO HEAD WO CONTRAST  Result Date: 02/29/2020 CLINICAL DATA:  Abnormal CT EXAM: MRI HEAD WITHOUT CONTRAST MRA HEAD WITHOUT CONTRAST MRA NECK WITHOUT CONTRAST TECHNIQUE: Multiplanar, multiecho pulse sequences of the brain and surrounding structures were obtained without intravenous contrast. Angiographic images of the Circle of Willis were obtained using MRA technique without intravenous contrast. Angiographic images of the neck were obtained using MRA technique without intravenous contrast. Carotid stenosis  measurements (when applicable) are obtained utilizing NASCET criteria, using the distal internal carotid diameter as the denominator. COMPARISON:  None. FINDINGS: MRI HEAD Brain: There is restricted diffusion in the lateral right occipital lobe with some parietotemporal extension reflecting acute infarction. Chronic left occipital infarct. Few additional patchy foci of T2 hyperintensity in the supratentorial and pontine white matter are nonspecific but probably reflect mild chronic microvascular ischemic changes. Foci of susceptibility are present in the posterior left lentiform nucleus, left thalamus, and midline cerebellum likely reflecting chronic microhemorrhages. There is no intracranial mass or significant mass effect. There is no hydrocephalus or extra-axial fluid collection. Vascular: Major vessel flow voids at the skull base are preserved. Skull and upper cervical spine: Normal marrow signal is preserved. Sinuses/Orbits: Paranasal sinuses are aerated. Orbits are unremarkable. Other: Sella is unremarkable.  Mastoid air cells are clear. MRA HEAD Intracranial internal carotid arteries are patent. Middle and anterior cerebral arteries are patent. Included intracranial vertebral arteries, basilar artery, posterior cerebral arteries are patent. There is no significant stenosis or aneurysm. MRA NECK Included portions of the common carotid arteries are patent. Internal and external carotid arteries are patent. There is no hemodynamically significant stenosis at the ICA origins by NASCET criteria. Included extracranial vertebral arteries are patent and codominant. IMPRESSION: Acute infarction of the lateral right occipital lobe with some parietotemporal extension. Chronic left occipital infarct. Mild chronic microvascular ischemic changes. No large vessel occlusion or hemodynamically significant stenosis. Electronically Signed   By: Macy Mis M.D.   On: 02/29/2020 13:40   MR ANGIO NECK WO CONTRAST  Result  Date: 02/29/2020 CLINICAL DATA:  Abnormal CT EXAM: MRI HEAD WITHOUT CONTRAST MRA HEAD WITHOUT CONTRAST MRA NECK WITHOUT CONTRAST TECHNIQUE: Multiplanar, multiecho pulse sequences of the brain and surrounding structures were obtained without intravenous contrast. Angiographic images of the Circle of Willis were obtained using MRA technique without intravenous contrast. Angiographic images of the neck were obtained using MRA technique without intravenous contrast. Carotid stenosis measurements (when applicable) are obtained utilizing NASCET criteria, using the distal internal carotid diameter as the denominator. COMPARISON:  None. FINDINGS: MRI HEAD Brain: There is restricted diffusion in the lateral right occipital lobe with some parietotemporal extension reflecting acute infarction. Chronic left occipital infarct. Few additional patchy foci of T2 hyperintensity in the supratentorial and pontine white matter are nonspecific but probably reflect mild chronic microvascular ischemic changes. Foci of susceptibility are present in the posterior left lentiform nucleus, left thalamus, and midline cerebellum likely reflecting chronic microhemorrhages. There is no intracranial mass or significant mass effect. There is no hydrocephalus or extra-axial fluid collection. Vascular: Major vessel flow voids at the skull base are preserved. Skull and upper cervical spine: Normal marrow signal is preserved. Sinuses/Orbits: Paranasal sinuses are aerated. Orbits are unremarkable. Other: Sella is unremarkable.  Mastoid air cells are clear. MRA HEAD Intracranial internal carotid arteries are patent. Middle and anterior cerebral arteries are patent. Included intracranial vertebral arteries, basilar artery, posterior cerebral arteries are patent. There is no significant stenosis or aneurysm. MRA NECK Included portions of the common carotid arteries are patent. Internal and external carotid arteries are  patent. There is no hemodynamically  significant stenosis at the ICA origins by NASCET criteria. Included extracranial vertebral arteries are patent and codominant. IMPRESSION: Acute infarction of the lateral right occipital lobe with some parietotemporal extension. Chronic left occipital infarct. Mild chronic microvascular ischemic changes. No large vessel occlusion or hemodynamically significant stenosis. Electronically Signed   By: Macy Mis M.D.   On: 02/29/2020 13:40   MR BRAIN WO CONTRAST  Result Date: 02/29/2020 CLINICAL DATA:  Abnormal CT EXAM: MRI HEAD WITHOUT CONTRAST MRA HEAD WITHOUT CONTRAST MRA NECK WITHOUT CONTRAST TECHNIQUE: Multiplanar, multiecho pulse sequences of the brain and surrounding structures were obtained without intravenous contrast. Angiographic images of the Circle of Willis were obtained using MRA technique without intravenous contrast. Angiographic images of the neck were obtained using MRA technique without intravenous contrast. Carotid stenosis measurements (when applicable) are obtained utilizing NASCET criteria, using the distal internal carotid diameter as the denominator. COMPARISON:  None. FINDINGS: MRI HEAD Brain: There is restricted diffusion in the lateral right occipital lobe with some parietotemporal extension reflecting acute infarction. Chronic left occipital infarct. Few additional patchy foci of T2 hyperintensity in the supratentorial and pontine white matter are nonspecific but probably reflect mild chronic microvascular ischemic changes. Foci of susceptibility are present in the posterior left lentiform nucleus, left thalamus, and midline cerebellum likely reflecting chronic microhemorrhages. There is no intracranial mass or significant mass effect. There is no hydrocephalus or extra-axial fluid collection. Vascular: Major vessel flow voids at the skull base are preserved. Skull and upper cervical spine: Normal marrow signal is preserved. Sinuses/Orbits: Paranasal sinuses are aerated. Orbits  are unremarkable. Other: Sella is unremarkable.  Mastoid air cells are clear. MRA HEAD Intracranial internal carotid arteries are patent. Middle and anterior cerebral arteries are patent. Included intracranial vertebral arteries, basilar artery, posterior cerebral arteries are patent. There is no significant stenosis or aneurysm. MRA NECK Included portions of the common carotid arteries are patent. Internal and external carotid arteries are patent. There is no hemodynamically significant stenosis at the ICA origins by NASCET criteria. Included extracranial vertebral arteries are patent and codominant. IMPRESSION: Acute infarction of the lateral right occipital lobe with some parietotemporal extension. Chronic left occipital infarct. Mild chronic microvascular ischemic changes. No large vessel occlusion or hemodynamically significant stenosis. Electronically Signed   By: Macy Mis M.D.   On: 02/29/2020 13:40   ECHOCARDIOGRAM COMPLETE  Result Date: 03/01/2020    ECHOCARDIOGRAM REPORT   Patient Name:   Morgan Beasley [facility] Date of Exam: 03/01/2020 Medical Rec #:  [id number]            Height:       66.0 in Accession #:    [id number]           Weight:       199.0 lb Date of Birth:  1960-10-25             BSA:          1.996 m Patient Age:    59 years             BP:           144/84 mmHg Patient Gender: F                    HR:           89 bpm. Exam Location:  Inpatient Procedure: 2D Echo, Cardiac Doppler and Color Doppler Indications:    Stroke 434.91 / I163.9  History:  Patient has prior history of Echocardiogram examinations, most                 recent 10/13/2019. CHF, COPD; Risk Factors:Hypertension,                 Dyslipidemia and Diabetes.  Sonographer:    Bernadene Person RDCS Referring Phys: [phone] DENISE A WOLFE  Sonographer Comments: Patient refused half way through exam IMPRESSIONS  1. Unable to fully assess EF and wall motion as patient terminated the exam early and we needed definity contrast to  delineate the LV endocardium. Based on limited visualization, the patient appears to have mildly-to-moderately reduced LV systolic function with LVEF ~40%. On short axis views, the anteior and septal walls appear moderately hypokinetic. Again, definity contrast would help delineate this further as visualization is very limited.  2. Left ventricular diastolic parameters are consistent with Grade II diastolic dysfunction (pseudonormalization). Elevated left atrial pressure.  3. Right ventricular systolic function is at least mildly reduced. Patient terminated exam prior to full evaluation of RV systolic function. The right ventricular size is mildly enlarged.  4. The mitral valve is normal in structure. Trivial mitral valve regurgitation.  5. The aortic valve is tricuspid. There is mild thickening of the aortic valve. Aortic valve regurgitation is trivial. Comparison(s): Compared to prior TTE in 09/22/19, this exam is much more limited, however, LVEF appears to have decreased to ~40%. Conclusion(s)/Recommendation(s): No intracardiac source of embolism detected on this transthoracic study. A transesophageal echocardiogram is recommended to exclude cardiac source of embolism if clinically indicated. FINDINGS  Left Ventricle: Unable to fully assess EF and wall motion as patient terminated the exam early and we needed definity contrast to delineate the LV endocardium. Based on limited visualization, the patient appears to have mildly-to-moderately reduced LV systolic function with LVEF ~40%. On short axis views, the anteior and septal walls appear moderately hypokinetic. The left ventricular internal cavity size was normal in size. There is borderline concentric left ventricular hypertrophy. Left ventricular  diastolic parameters are consistent with Grade II diastolic dysfunction (pseudonormalization). Elevated left atrial pressure. The E/e' is 21.5. Again, definity contrast would help delineate this further as  visualization is very limited. Right Ventricle: The right ventricular size is mildly enlarged. Right vetricular wall thickness was not well visualized. Right ventricular systolic function is at least mildly reduced. Test terminated by the patient prior to full evaluation of RV systolic function. Left Atrium: Left atrial size was normal in size. Right Atrium: Right atrial size was normal in size. Pericardium: There is no evidence of pericardial effusion. Mitral Valve: The mitral valve is normal in structure. There is mild thickening of the mitral valve leaflet(s). There is mild calcification of the mitral valve leaflet(s). Trivial mitral valve regurgitation. Tricuspid Valve: The tricuspid valve is normal in structure. Tricuspid valve regurgitation is trivial. Aortic Valve: The aortic valve is tricuspid. There is mild thickening of the aortic valve. Aortic valve regurgitation is trivial. Pulmonic Valve: The pulmonic valve was normal in structure. Pulmonic valve regurgitation is trivial. Aorta: The aortic root and ascending aorta are structurally normal, with no evidence of dilitation. IAS/Shunts: The atrial septum is grossly normal.  LEFT VENTRICLE PLAX 2D LVIDd:         5.10 cm  Diastology LVIDs:         3.70 cm  LV e' medial:    4.70 cm/s LV PW:         0.90 cm  LV E/e' medial:  21.5 LV  IVS:        0.80 cm  LV e' lateral:   5.59 cm/s LVOT diam:     2.00 cm  LV E/e' lateral: 18.1 LV SV:         41 LV SV Index:   20 LVOT Area:     3.14 cm  LEFT ATRIUM           Index       RIGHT ATRIUM           Index LA diam:      4.10 cm 2.05 cm/m  RA Area:     18.20 cm LA Vol (A4C): 65.7 ml 32.92 ml/m RA Volume:   54.80 ml  27.46 ml/m  AORTIC VALVE LVOT Vmax:   77.30 cm/s LVOT Vmean:  51.400 cm/s LVOT VTI:    0.129 m  AORTA Ao Root diam: 2.70 cm Ao Asc diam:  2.90 cm MITRAL VALVE                TRICUSPID VALVE MV Area (PHT): 5.38 cm     TR Peak grad:   17.5 mmHg MV Decel Time: 141 msec     TR Vmax:        209.00 cm/s MV E  velocity: 101.00 cm/s MV A velocity: 71.80 cm/s   SHUNTS MV E/A ratio:  1.41         Systemic VTI:  0.13 m                             Systemic Diam: 2.00 cm Gwyndolyn Kaufman MD Electronically signed by Gwyndolyn Kaufman MD Signature Date/Time: 03/01/2020/11:55:54 AM    Final    US LIVER DOPPLER  Result Date: 03/01/2020 CLINICAL DATA:  Elevated LFTs.  Evaluate hepatic vasculature. EXAM: DUPLEX ULTRASOUND OF LIVER TECHNIQUE: Color and duplex Doppler ultrasound was performed to evaluate the hepatic in-flow and out-flow vessels. COMPARISON:  Right upper quadrant abdominal ultrasound-02/28/2020 FINDINGS: Liver: While there is preserved hepatic echogenicity, there is suspected mild nodularity hepatic contour. No discrete hepatic lesions. No intrahepatic biliary duct dilatation. Main Portal Vein size: 1.1 cm Portal Vein Velocities (normal hepatopetal directional flow) Main Prox:  21 cm/sec Main Mid: 21 cm/sec Main Dist:  23 cm/sec Right: 22 cm/sec Left: 13 cm/sec Hepatic Vein Velocities (normal hepatofugal directional flow) Right:  7 cm/sec Middle:  28 cm/sec Left:  20 cm/sec IVC: Present and patent with normal respiratory phasicity. Hepatic Artery Velocity:  58 cm/sec Splenic Vein Velocity:  31 cm/sec Spleen: 8.6 cm x 3.2 cm x 1.6 cm with a total volume of 23 cm^3 (411 cm^3 is upper limit normal) Portal Vein Occlusion/Thrombus: No Splenic Vein Occlusion/Thrombus: No Ascites: None Varices: None IMPRESSION: 1. Patent hepatic vasculature with normal directional flow. 2. Suspected mild nodularity hepatic contour as could be seen in the setting of early cirrhotic change without stigmata of portal venous hypertension, specifically, no evidence of splenomegaly or intra-abdominal ascites. No discrete hepatic lesions. Electronically Signed   By: Sandi Mariscal M.D.   On: 03/01/2020 08:45    Anti-infectives: Anti-infectives (From admission, onward)   Start     Dose/Rate Route Frequency Ordered Stop   03/02/20 1800  ceFEPIme  (MAXIPIME) 2 g in sodium chloride 0.9 % 100 mL IVPB        2 g 200 mL/hr over 30 Minutes Intravenous Every 12 hours 03/02/20 0853     02/28/20 0546  ceFEPIme (MAXIPIME) 2 g in  sodium chloride 0.9 % 100 mL IVPB  Status:  Discontinued        2 g 200 mL/hr over 30 Minutes Intravenous Every 24 hours 02/27/20 1041 03/02/20 0853   02/25/20 1400  metroNIDAZOLE (FLAGYL) tablet 500 mg  Status:  Discontinued        500 mg Oral Every 8 hours 02/25/20 1025 02/29/20 0911   02/24/20 1800  ceFEPIme (MAXIPIME) 2 g in sodium chloride 0.9 % 100 mL IVPB  Status:  Discontinued        2 g 200 mL/hr over 30 Minutes Intravenous Every 12 hours 02/24/20 0732 02/27/20 1041   02/24/20 0600  ceFEPIme (MAXIPIME) 2 g in sodium chloride 0.9 % 100 mL IVPB  Status:  Discontinued        2 g 200 mL/hr over 30 Minutes Intravenous Every 8 hours 02/24/20 0440 02/24/20 0732   02/23/20 1015  vancomycin (VANCOREADY) IVPB 1750 mg/350 mL        1,750 mg 175 mL/hr over 120 Minutes Intravenous  Once 02/23/20 1007 02/23/20 1347   02/23/20 1000  cefTRIAXone (ROCEPHIN) 2 g in sodium chloride 0.9 % 100 mL IVPB  Status:  Discontinued        2 g 200 mL/hr over 30 Minutes Intravenous Every 24 hours 02/23/20 0952 02/24/20 0440   02/23/20 1000  metroNIDAZOLE (FLAGYL) IVPB 500 mg  Status:  Discontinued        500 mg 100 mL/hr over 60 Minutes Intravenous Every 8 hours 02/23/20 0952 02/25/20 1025       Assessment/Plan Chronic systolic and diastolic CHF - echo 06/231 EF 40-45% CKD II COPD not on home O2  OSA - BiPAP or CPAP at home  DM2 - A1c this admission 15.1, poorly controlled, peripheral neuropathy HLD HTN PAD L femoropoliteal bypass and laser angioplasty LLE by Dr. Vella Redhead 10/14/19 Chronic non-healing L foot ulcer Serratia bacteremia   Elevated LFT's- trending down: AST 121, ALT 152, Alk Phos 249, bili 0.6 Possible calculous cholecystitisvs. Acute liver injury - RUQ U/S with signs of possible cholecystitis - Patient refused  HIDA scan yesterday. Today she is tolerating a diet and her abdominal exam is benign. WBC WNL and she is afebrile. Very low suspicion for cholecystitis. Ok to cancel HIDA. No indication for cholecystectomy. We will sign off, please call with concerns.   LOS: 8 days    [facility] 03/02/2020, 9:28 AM Please see Amion for pager number during day hours 7:00am-4:30pm

## 2020-03-02 NOTE — Progress Notes (Signed)
STROKE TEAM PROGRESS NOTE   INTERVAL HISTORY PT and OT are in the room with her. She is sitting in chair, neuro stable, no acute change over night. AST and ALT continue to trending down. Surgery low suspicious for cholecystitis. Pending TEE 11/4. Still has left foot ulcer in dressing but still tender on palpation.    Vitals:   03/01/20 2307 03/02/20 0309 03/02/20 0400 03/02/20 0750  BP: (!) 142/84 (!) 150/91  (!) 138/97  Pulse: 87 85    Resp: 20 20    Temp: 98 F (36.7 C) (!) 97.5 F (36.4 C) 98 F (36.7 C) 97.7 F (36.5 C)  TempSrc: Axillary Axillary Axillary Axillary  SpO2: 98% 95%    Weight:      Height:       CBC:  Recent Labs  Lab 03/01/20 0500 03/02/20 0431  WBC 6.8 5.8  NEUTROABS 4.6 3.7  HGB 8.5* 8.1*  HCT 28.2* 26.4*  MCV 81.0 81.5  PLT 350 979   Basic Metabolic Panel:  Recent Labs  Lab 03/01/20 0500 03/02/20 0431  NA 135 135  K 3.3* 3.3*  CL 120* 124*  CO2 20* 18*  GLUCOSE 64* 104*  BUN 27* 20  CREATININE 1.87* 1.49*  CALCIUM 6.4* 6.2*  MG 1.6* 1.4*   Lipid Panel:  Recent Labs  Lab 03/01/20 0805  CHOL 116  TRIG 83  HDL 22*  CHOLHDL 5.3  VLDL 17  LDLCALC 77   HgbA1c:  No results for input(s): HGBA1C in the last 168 hours. Urine Drug Screen: No results for input(s): LABOPIA, COCAINSCRNUR, LABBENZ, AMPHETMU, THCU, LABBARB in the last 168 hours.  Alcohol Level No results for input(s): ETH in the last 168 hours.  IMAGING past 24 hours No results found.  PHYSICAL EXAM  Temp:  [97.5 F (36.4 C)-98.4 F (36.9 C)] 97.7 F (36.5 C) (11/02 0750) Pulse Rate:  [85-97] 85 (11/02 0309) Resp:  [18-20] 20 (11/02 0309) BP: (135-150)/(84-97) 138/97 (11/02 0750) SpO2:  [95 %-100 %] 95 % (11/02 0309)  General - Well nourished, well developed, in no apparent distress.  Ophthalmologic - fundi not visualized due to noncooperation.  Cardiovascular - Regular rhythm and rate.  Neuro - awake alert, eyes open, psychomotor slowing. Orientated to self and  place, however, not orientated to age or time. Moderate dysarthria, paucity of speech, but no aphasia, able to name 2/4 and repeat in dysarthric voice. Visual field full, no gaze preference, facial symmetrical, tongue midline. PERRL. BUE 3/5 with asterixis, BLE proximal 2/5 and knee flexion 3-/5 and ankle PF 3/5. Left foot unhealing wound with pus. Sensation subjectively symmetrical. B/l FTN slow but grossly intact. Gait not tested.  ASSESSMENT/PLAN Ms. Daci Stubbe is a 59 y.o. female with history of prior substance abuse (THC, cocaine),  obesity, poorly controlled diabetes mellitus, CAD, systolic/diastolic CHF, ongoing tobacco abuse, stage II CKD, COPD not on home oxygen, OSA on nightly BiPAP, hypertension, PVD/PAD status post angioplasty and stent placement and  left femoral to below-knee popliteal bypass 09/2019 with nonhealing left foot ulcerwho  presenting with L leg and food pain. CT done for encephalopathy revealed a subacute R occipital infarct.   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    MRI  R occipital infarct w/ paretotemporal extension, chronic L occipital infarct   MRA head and neck bilateral siphon and right PCA stenosis  2D Echo EF 40%. No  source of embolus   TEE Thurs 11/4 at 0800 to look for embolic source of stroke  LDL 77  HgbA1c 15.1  VTE prophylaxis - Heparin 5000 units sq tid   clopidogrel 75 mg daily prior to admission, now on clopidogrel 75 mg daily.  Avoid DAPT for now until rule out endocarditis.  Therapy recommendations:  SNF   Disposition:  pending   Septic shock   d/t serratia bacteremia   d/t BLE PAD w/ chronic LLE non-healing ucler in a DM   BP stable now  On cefepime  Transaminitis   Chronic pancreatitis d/t ALCOHOL use  elevated LTFs but trending down  Surgery on board - low suspicious for cholecystitis -  signed off  INR 3.2->2.7->2.1 - on VitK   Hold off statin for now  Hypotension Hx of hypertension  Now stable . Long-term BP goal normotensive  Hyperlipidemia  Home meds:  crestor 40 and zetia  LDL 77, goal < 70  No statin d/t elevated LFTs  Continue statin once LFTs normalize  Diabetes type II Uncontrolled  HgbA1c 15.1, goal < 7.0  CBGs  SSI  On Lantus  DB RN following  Tobacco abuse  Current smoker  Smoking cessation counseling provided  Pt is willing to quit  Other Stroke Risk Factors  ETOH use, advised to drink no more than 2 drink(s) a day  Hx Substance abuse - UDS / ETOH level not performed, chronic narcotic use.     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 For OP bx  COPD   Possible underlying psychiatric d/o  Normocytic anemia of chronic dz  Hypokalemia  AKI on CKD stage 3a  Hospital day # 8  Rosalin Hawking, MD PhD Stroke Neurology 03/02/2020 11:23 AM    To contact Stroke Continuity provider, please refer to [email or web address] After hours, contact General Neurology

## 2020-03-03 ENCOUNTER — Inpatient Hospital Stay (HOSPITAL_COMMUNITY): Payer: Medicare Other

## 2020-03-03 DIAGNOSIS — I63431 Cerebral infarction due to embolism of right posterior cerebral artery: Secondary | ICD-10-CM | POA: Diagnosis not present

## 2020-03-03 DIAGNOSIS — A419 Sepsis, unspecified organism: Secondary | ICD-10-CM | POA: Diagnosis not present

## 2020-03-03 DIAGNOSIS — R6521 Severe sepsis with septic shock: Secondary | ICD-10-CM | POA: Diagnosis not present

## 2020-03-03 LAB — COMPREHENSIVE METABOLIC PANEL
ALT: 176 U/L — ABNORMAL HIGH (ref 0–44)
AST: 91 U/L — ABNORMAL HIGH (ref 15–41)
Albumin: 1.7 g/dL — ABNORMAL LOW (ref 3.5–5.0)
Alkaline Phosphatase: 332 U/L — ABNORMAL HIGH (ref 38–126)
Anion gap: 7 (ref 5–15)
BUN: 18 mg/dL (ref 6–20)
CO2: 25 mmol/L (ref 22–32)
Calcium: 8.5 mg/dL — ABNORMAL LOW (ref 8.9–10.3)
Chloride: 104 mmol/L (ref 98–111)
Creatinine, Ser: 1.49 mg/dL — ABNORMAL HIGH (ref 0.44–1.00)
GFR, Estimated: 40 mL/min — ABNORMAL LOW (ref >60)
Glucose, Bld: 144 mg/dL — ABNORMAL HIGH (ref 70–99)
Potassium: 4.1 mmol/L (ref 3.5–5.1)
Sodium: 136 mmol/L (ref 135–145)
Total Bilirubin: 0.5 mg/dL (ref 0.3–1.2)
Total Protein: 6.2 g/dL — ABNORMAL LOW (ref 6.5–8.1)

## 2020-03-03 LAB — GLUCOSE, CAPILLARY
Glucose-Capillary: 201 mg/dL — ABNORMAL HIGH (ref 70–99)
Glucose-Capillary: 129 mg/dL — ABNORMAL HIGH (ref 70–99)
Glucose-Capillary: 211 mg/dL — ABNORMAL HIGH (ref 70–99)
Glucose-Capillary: 201 mg/dL — ABNORMAL HIGH (ref 70–99)
Glucose-Capillary: 211 mg/dL — ABNORMAL HIGH (ref 70–99)

## 2020-03-03 LAB — MAGNESIUM: Magnesium: 2 mg/dL (ref 1.7–2.4)

## 2020-03-03 LAB — CBC WITH DIFFERENTIAL/PLATELET
Abs Immature Granulocytes: 0.04 10*3/uL (ref 0.00–0.07)
Basophils Absolute: 0 10*3/uL (ref 0.0–0.1)
Basophils Relative: 0 %
Eosinophils Absolute: 0.2 10*3/uL (ref 0.0–0.5)
Eosinophils Relative: 3 %
HCT: 34 % — ABNORMAL LOW (ref 36.0–46.0)
Hemoglobin: 10.5 g/dL — ABNORMAL LOW (ref 12.0–15.0)
Immature Granulocytes: 1 %
Lymphocytes Relative: 23 %
Lymphs Abs: 1.9 10*3/uL (ref 0.7–4.0)
MCH: 24.4 pg — ABNORMAL LOW (ref 26.0–34.0)
MCHC: 30.9 g/dL (ref 30.0–36.0)
MCV: 78.9 fL — ABNORMAL LOW (ref 80.0–100.0)
Monocytes Absolute: 1 10*3/uL (ref 0.1–1.0)
Monocytes Relative: 13 %
Neutro Abs: 4.9 10*3/uL (ref 1.7–7.7)
Neutrophils Relative %: 60 %
Platelets: 441 10*3/uL — ABNORMAL HIGH (ref 150–400)
RBC: 4.31 MIL/uL (ref 3.87–5.11)
RDW: 19.1 % — ABNORMAL HIGH (ref 11.5–15.5)
WBC: 8 10*3/uL (ref 4.0–10.5)
nRBC: 3.3 % — ABNORMAL HIGH (ref 0.0–0.2)

## 2020-03-03 LAB — PROTIME-INR
INR: 1.4 — ABNORMAL HIGH (ref 0.8–1.2)
Prothrombin Time: 16.3 seconds — ABNORMAL HIGH (ref 11.4–15.2)

## 2020-03-03 LAB — BRAIN NATRIURETIC PEPTIDE: B Natriuretic Peptide: 2599.6 pg/mL — ABNORMAL HIGH (ref 0.0–100.0)

## 2020-03-03 MED ORDER — SODIUM CHLORIDE 0.9 % IV SOLN: INTRAVENOUS | Status: DC

## 2020-03-03 MED ORDER — INSULIN ASPART 100 UNIT/ML ~~LOC~~ SOLN
0.0000 [IU] | Freq: Three times a day (TID) | SUBCUTANEOUS | Status: DC
  Administered 2020-03-03: 3 [IU] via SUBCUTANEOUS
  Administered 2020-03-04: 2 [IU] via SUBCUTANEOUS
  Administered 2020-03-04: 3 [IU] via SUBCUTANEOUS
  Administered 2020-03-05: 5 [IU] via SUBCUTANEOUS
  Administered 2020-03-05: 2 [IU] via SUBCUTANEOUS
  Administered 2020-03-05: 3 [IU] via SUBCUTANEOUS
  Administered 2020-03-06: 2 [IU] via SUBCUTANEOUS
  Administered 2020-03-06 – 2020-03-07 (×2): 5 [IU] via SUBCUTANEOUS
  Administered 2020-03-07: 3 [IU] via SUBCUTANEOUS
  Administered 2020-03-08: 5 [IU] via SUBCUTANEOUS
  Administered 2020-03-08: 3 [IU] via SUBCUTANEOUS
  Administered 2020-03-09: 11 [IU] via SUBCUTANEOUS
  Administered 2020-03-09: 3 [IU] via SUBCUTANEOUS
  Administered 2020-03-09 – 2020-03-10 (×2): 8 [IU] via SUBCUTANEOUS
  Administered 2020-03-10: 15 [IU] via SUBCUTANEOUS
  Administered 2020-03-11: 8 [IU] via SUBCUTANEOUS
  Administered 2020-03-11 (×2): 2 [IU] via SUBCUTANEOUS
  Administered 2020-03-12: 5 [IU] via SUBCUTANEOUS
  Administered 2020-03-12: 8 [IU] via SUBCUTANEOUS
  Administered 2020-03-12: 11 [IU] via SUBCUTANEOUS
  Administered 2020-03-13: 5 [IU] via SUBCUTANEOUS
  Administered 2020-03-13: 8 [IU] via SUBCUTANEOUS
  Administered 2020-03-14: 5 [IU] via SUBCUTANEOUS
  Administered 2020-03-14: 2 [IU] via SUBCUTANEOUS
  Administered 2020-03-15 (×3): 5 [IU] via SUBCUTANEOUS
  Administered 2020-03-16 (×2): 3 [IU] via SUBCUTANEOUS
  Administered 2020-03-17: 5 [IU] via SUBCUTANEOUS
  Administered 2020-03-17: 2 [IU] via SUBCUTANEOUS
  Administered 2020-03-18: 8 [IU] via SUBCUTANEOUS
  Administered 2020-03-18: 2 [IU] via SUBCUTANEOUS

## 2020-03-03 MED ORDER — INSULIN ASPART 100 UNIT/ML ~~LOC~~ SOLN
0.0000 [IU] | Freq: Every day | SUBCUTANEOUS | Status: DC
  Administered 2020-03-03 – 2020-03-06 (×2): 2 [IU] via SUBCUTANEOUS
  Administered 2020-03-07: 3 [IU] via SUBCUTANEOUS
  Administered 2020-03-08: 4 [IU] via SUBCUTANEOUS
  Administered 2020-03-10 – 2020-03-12 (×3): 2 [IU] via SUBCUTANEOUS
  Administered 2020-03-13: 4 [IU] via SUBCUTANEOUS
  Administered 2020-03-14: 3 [IU] via SUBCUTANEOUS
  Administered 2020-03-16: 2 [IU] via SUBCUTANEOUS
  Administered 2020-03-17: 3 [IU] via SUBCUTANEOUS

## 2020-03-03 MED ORDER — FUROSEMIDE 10 MG/ML IJ SOLN
60.0000 mg | Freq: Once | INTRAMUSCULAR | Status: AC
  Administered 2020-03-03: 60 mg via INTRAVENOUS
  Filled 2020-03-03: qty 6

## 2020-03-03 NOTE — Progress Notes (Signed)
STROKE TEAM PROGRESS NOTE   INTERVAL HISTORY RN at bedside.  Patient lying in bed, comfortably, more awake alert, interactive.  Denies any discomfort.  Pending TEE tomorrow.  Vitals:   03/03/20 0414 03/03/20 0750 03/03/20 0805 03/03/20 1142  BP: (!) 147/76 100/85  (!) 145/108  Pulse: 99 96 94 (!) 108  Resp: (!) 24 20 18 19   Temp: 97.8 F (36.6 C) 98.7 F (37.1 C)  97.6 F (36.4 C)  TempSrc: Axillary Oral  Oral  SpO2: 100% 97% 97% 94%  Weight:      Height:       CBC:  Recent Labs  Lab 03/02/20 0431 03/03/20 0606  WBC 5.8 8.0  NEUTROABS 3.7 4.9  HGB 8.1* 10.5*  HCT 26.4* 34.0*  MCV 81.5 78.9*  PLT 337 175*   Basic Metabolic Panel:  Recent Labs  Lab 03/02/20 0431 03/03/20 0606  NA 135 136  K 3.3* 4.1  CL 124* 104  CO2 18* 25  GLUCOSE 104* 144*  BUN 20 18  CREATININE 1.49* 1.49*  CALCIUM 6.2* 8.5*  MG 1.4* 2.0   Lipid Panel:  Recent Labs  Lab 03/01/20 0805  CHOL 116  TRIG 83  HDL 22*  CHOLHDL 5.3  VLDL 17  LDLCALC 77   HgbA1c:  No results for input(s): HGBA1C in the last 168 hours. Urine Drug Screen: No results for input(s): LABOPIA, COCAINSCRNUR, LABBENZ, AMPHETMU, THCU, LABBARB in the last 168 hours.  Alcohol Level No results for input(s): ETH in the last 168 hours.  IMAGING past 24 hours No results found.  PHYSICAL EXAM   Temp:  [97.6 F (36.4 C)-98.7 F (37.1 C)] 97.6 F (36.4 C) (11/03 1142) Pulse Rate:  [94-108] 108 (11/03 1142) Resp:  [17-24] 19 (11/03 1142) BP: (100-147)/(76-108) 145/108 (11/03 1142) SpO2:  [93 %-100 %] 94 % (11/03 1142)  General - Well nourished, well developed, in no apparent distress.  Ophthalmologic - fundi not visualized due to noncooperation.  Cardiovascular - Regular rhythm and rate.  Neuro - awake alert, eyes open, more interactive than yesterday. Orientated to self and place, however, not orientated to age or time. Moderate dysarthria, paucity of speech, but no aphasia, able to name 2/4 and repeat in  dysarthric voice. Visual field full, no gaze preference, facial symmetrical, tongue midline. PERRL. BUE 3/5 with asterixis, BLE proximal 2/5 and knee flexion 3-/5 and ankle PF 3/5. Left foot unhealing wound with pus. Sensation subjectively symmetrical. B/l FTN slow but grossly intact. Gait not tested.  ASSESSMENT/PLAN Ms. Jakeisha Stricker is a 59 y.o. female with history of prior substance abuse (THC, cocaine),  obesity, poorly controlled diabetes mellitus, CAD, systolic/diastolic CHF, ongoing tobacco abuse, stage II CKD, COPD not on home oxygen, OSA on nightly BiPAP, hypertension, PVD/PAD status post angioplasty and stent placement and  left femoral to below-knee popliteal bypass 09/2019 with nonhealing left foot ulcerwho  presenting with L leg and food pain. CT done for encephalopathy revealed a subacute R occipital infarct.   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    MRI  R occipital infarct w/ paretotemporal extension, chronic L occipital infarct   MRA head and neck bilateral siphon and right PCA stenosis  2D Echo EF 40%. No source of embolus   TEE Thurs 11/4 at 0800 to look for embolic source of stroke  LDL 77  HgbA1c 15.1  VTE prophylaxis - Heparin 5000  units sq tid   clopidogrel 75 mg daily prior to admission, now on clopidogrel 75 mg daily.  Avoid DAPT for now until rule out endocarditis.  Therapy recommendations:  SNF   Disposition:  pending   Septic shock   d/t serratia bacteremia   d/t BLE PAD w/ chronic LLE non-healing ucler in a DM   BP stable now  On cefepime  Transaminitis   Chronic pancreatitis d/t ALCOHOL use  elevated LTFs but trending down  Surgery on board - low suspicious for cholecystitis - signed off  INR 3.2->2.7->2.1->1.4 - on VitK   Hold off statin for now  Hypotension Hx of hypertension  Now stable . Long-term  BP goal normotensive  Hyperlipidemia  Home meds:  crestor 40 and zetia  LDL 77, goal < 70  No statin d/t elevated LFTs  Consider to restart statin once LFTs normalize  Diabetes type II Uncontrolled  HgbA1c 15.1, goal < 7.0  CBGs  SSI  On Lantus  DB RN following  Tobacco abuse  Current smoker  Smoking cessation counseling provided  Pt is willing to quit  On nicotine patch  Other Stroke Risk Factors  ETOH use, advised to drink no more than 2 drink(s) a day  Hx Substance abuse - UDS / ETOH level not performed, chronic narcotic use.     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 For OP bx  COPD   Possible underlying psychiatric d/o  Macrocytic anemia of chronic dz, Hb 9.9->8.5->8.1->10.5  Hypokalemia  AKI on CKD stage 3a, Cre 1.87->1.49->1.49  Hospital day # 9  Rosalin Hawking, MD PhD Stroke Neurology 03/03/2020 2:37 PM    To contact Stroke Continuity provider, please refer to [email or web address] After hours, contact General Neurology

## 2020-03-03 NOTE — Progress Notes (Signed)
Pt wants to go on bipap at 2200

## 2020-03-03 NOTE — Progress Notes (Signed)
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 She was found to have sepsis secondary to Serratia bacteremia from left lower extremity cellulitis with complicated chronic nonhealing ulcer. Hospital course remarkable for persistent lactic acidosis concerning for critical lower extremity vascular ischemia. Vascular surgery consulted and she was transferred to [facility]. During this hospitalization, she developed acute nonhemorrhagic stroke, also developed AKI and shock liver due to hypotension. Neurology following for a stroke. Plan for TEE.   Assessment & Plan:   Principal Problem:   Septic shock ([place]) Active Problems:   Uncontrolled diabetes mellitus with circulatory complication, with long-term current use of insulin (HCC)   COPD (chronic obstructive pulmonary disease) (HCC)   PVD (peripheral vascular disease) (HCC)   Ischemia of left lower extremity   Chronic systolic CHF (congestive heart failure) (HCC)   Obesity (BMI 30-39.9)   Cerebral embolism with cerebral infarction   Septic shock/gram-negative bacteremia: Secondary to bilateral lower extremity peripheral artery disease with chronic left lower extremity nonhealing ulcer diabetic patient. Hospital course remarkable for shock, AKI. She  was treated with IV fluids. Sepsis physiology has been improving. Continue current antibiotics and wound care, gentle IV fluids.  Acute nonhemorrhagic stroke: Hospital course remarkable for encephalopathy secondary to acute right occipital infarct. She developed mild left arm weakness. Neurology following. Stroke work-up in process. Plan for TEE. She has multiple risk factors including uncontrolled diabetes with hemoglobin A1c of 15, tobacco use, severe peripheral artery disease, elevated LDL. Plan to resume a statin after improvement in the liver function. On Plavix currently. Neurology following.  Peripheral artery disease/nonhealing lower extremity ulcer: Vascular surgery following. She is status post left femoropopliteal bypass on 10/19/2019. On Plavix and statin at home.  Chronic combined systolic/diastolic congestive heart failure: Echo done few months ago showed ejection fraction of 40 to 40% with global hypokinesis. Elevated BNP. She received fluids during this hospitalization for septic shock.  She looks volume overloaded.  She takes 40 mg of Lasix at home daily.  We will give her a dose of IV Lasix 60 mg once and then resume her home regimen from tomorrow.  Will monitor input and output.  Elevated liver enzymes: Most likely from shock liver from hypotension. Right upper quadrant ultrasound showed nonspecific changes. She was seen by general surgery, no plan for intervention. Acute hepatitis panel negative. Right upper quadrant liver Doppler stable. Questionable early NASH on right upper quadrant ultrasound.  Electrolyte abnormalities: Continue to monitor and supplement.  Diabetes type 2/uncontrolled hyperglycemia: Hemoglobin A1c of 15. Continue current insulin regimen. Monitor blood sugars.  Anemia of chronic disease: Currently hemoglobin is stable. Continue to monitor.  AKI on CKD stage IIIa: Baseline creatinine 1.4. Hospital course remarkable for AKI secondary to contrast nephropathy,  dehydration, hypotension. Kidney function improved with IV fluids. Currently kidney function at baseline.  Hyperlipidemia: On statin and Zetia. Will resume statin when liver function  improves.  COPD/tobacco abuse: Counseled for smoking cessation. Currently stable in terms of COPD.  Obesity with OSA: BMI of 32. Uses BiPAP at night at home.  Hypertension: Hospital course remarkable for hypotension. Beta-blockers on hold. Monitor blood pressure.  Reactive mediastinal/hilar/right supraclavicular adenopathy: Incidental finding during this hospitalization. Outpatient biopsy recommended.      Nutrition Problem: Increased nutrient needs Etiology: wound healing      DVT prophylaxis:Heparin [place] Code Status: Full Family Communication: None at bedside Status is: Inpatient  Remains inpatient appropriate because:Inpatient level of care appropriate due to severity of illness   Dispo: The patient is from: Home              Anticipated d/c is to: SNF              Anticipated d/c date is: > 3 days              Patient currently is not medically stable to d/c.   Workup in process  Consultants: Vascular surgery, Neurology   Procedures:  Antimicrobials:  Anti-infectives (From admission, onward)   Start     Dose/Rate Route Frequency Ordered Stop   03/02/20 1800  ceFEPIme (MAXIPIME) 2 g in sodium chloride 0.9 % 100 mL IVPB        2 g 200 mL/hr over 30 Minutes Intravenous Every 12 hours 03/02/20 0853     02/28/20 0546  ceFEPIme (MAXIPIME) 2 g in sodium chloride 0.9 % 100 mL IVPB  Status:  Discontinued        2 g 200 mL/hr over 30 Minutes Intravenous Every 24 hours 02/27/20 1041 03/02/20 0853   02/25/20 1400  metroNIDAZOLE (FLAGYL) tablet 500 mg  Status:  Discontinued        500 mg Oral Every 8 hours 02/25/20 1025 02/29/20 0911   02/24/20 1800  ceFEPIme (MAXIPIME) 2 g in sodium chloride 0.9 % 100 mL IVPB  Status:  Discontinued        2 g 200 mL/hr over 30 Minutes Intravenous Every 12  hours 02/24/20 0732 02/27/20 1041   02/24/20 0600  ceFEPIme (MAXIPIME) 2 g in sodium chloride 0.9 % 100 mL IVPB  Status:  Discontinued        2 g 200 mL/hr over 30 Minutes Intravenous Every 8 hours 02/24/20 0440 02/24/20 0732   02/23/20 1015  vancomycin (VANCOREADY) IVPB 1750 mg/350 mL        1,750 mg 175 mL/hr over 120 Minutes Intravenous  Once 02/23/20 1007 02/23/20 1347   02/23/20 1000  cefTRIAXone (ROCEPHIN) 2 g in sodium chloride 0.9 % 100 mL IVPB  Status:  Discontinued        2 g 200 mL/hr over 30 Minutes Intravenous Every 24 hours 02/23/20 0952 02/24/20 0440   02/23/20 1000  metroNIDAZOLE (FLAGYL) IVPB 500 mg  Status:  Discontinued        500 mg 100 mL/hr over 60 Minutes Intravenous Every 8 hours 02/23/20 0952 02/25/20 1025      Subjective:  Patient seen and examined at the bedside this morning.  During my evaluation, she was hemodynamically stable.  Lying on the bed, appears comfortable.  Denies any complaints.  She is oriented to place only.  Looks volume overloaded.   Objective: Vitals:   03/03/20 0022 03/03/20 0414 03/03/20 0750 03/03/20 0805  BP: 123/84 (!) 147/76 100/85   Pulse: 96 99 96 94  Resp: 17 (!) 24 20 18   Temp: 98.4 F (36.9 C) 97.8 F (36.6 C) 98.7  F (37.1 C)   TempSrc: Oral Axillary Oral   SpO2: 93% 100% 97% 97%  Weight:      Height:        Intake/Output Summary (Last 24 hours) at 03/03/2020 0945 Last data filed at 03/03/2020 0600 Gross per 24 hour  Intake 220 ml  Output 1300 ml  Net -1080 ml   Filed Weights   02/23/20 0937  Weight: 90.3 kg    Examination:  General exam: Chronically ill looking, debilitated, deconditioned, anasarca HEENT:PERRL,Oral mucosa moist, Ear/Nose normal on gross exam Respiratory system: Mildly diminished air entry in the bases, no frank wheezes or crackles Cardiovascular system: S1 & S2 heard, RRR. No JVD, murmurs, rubs, gallops or clicks. 1-2+ pedal edema. Gastrointestinal system: Abdomen is distended, soft and  nontender. No organomegaly or masses felt. Normal bowel sounds heard. Central nervous system: Alert ,awake and oriented to place only.Dysarthia.  Mild left upper extremity weakness  extremities: 1-2 plus lower extremity edema, no clubbing , ulcer on the dorsum of the left foot  skin: Ulcer on the dorsum of the left foot, no icterus ,no pallor   Data Reviewed: I have personally reviewed following labs and imaging studies  CBC: Recent Labs  Lab 02/28/20 0500 02/29/20 0455 03/01/20 0500 03/02/20 0431 03/03/20 0606  WBC 7.3 7.7 6.8 5.8 8.0  NEUTROABS 4.9 5.7 4.6 3.7 4.9  HGB 8.5* 9.9* 8.5* 8.1* 10.5*  HCT 28.2* 32.2* 28.2* 26.4* 34.0*  MCV 82.9 81.5 81.0 81.5 78.9*  PLT 319 378 350 337 706*   Basic Metabolic Panel: Recent Labs  Lab 02/28/20 0500 02/29/20 0455 03/01/20 0500 03/02/20 0431 03/03/20 0606  NA 134* 134* 135 135 136  K 2.9* 4.7 3.3* 3.3* 4.1  CL 122* 104 120* 124* 104  CO2 18* 23 20* 18* 25  GLUCOSE 121* 80 64* 104* 144*  BUN 33* 39* 27* 20 18  CREATININE 2.38* 2.73* 1.87* 1.49* 1.49*  CALCIUM 5.8* 7.6* 6.4* 6.2* 8.5*  MG 1.4* 2.3 1.6* 1.4* 2.0   GFR: Estimated Creatinine Clearance: 46.6 mL/min (A) (by C-G formula based on SCr of 1.49 mg/dL (H)). Liver Function Tests: Recent Labs  Lab 02/28/20 0500 02/29/20 0455 03/01/20 0500 03/02/20 0431 03/03/20 0606  AST 829* 758* 266* 121* 91*  ALT 235* 341* 213* 152* 176*  ALKPHOS 181* 290* 214* 249* 332*  BILITOT 0.6 0.6 0.6 0.6 0.5  PROT 5.4* 4.9* 5.4* 5.5* 6.2*  ALBUMIN 1.1* 1.4* 1.2* 1.1* 1.7*   No results for input(s): LIPASE, AMYLASE in the last 168 hours. No results for input(s): AMMONIA in the last 168 hours. Coagulation Profile: Recent Labs  Lab 02/28/20 0500 02/29/20 0455 03/01/20 0500 03/02/20 0431 03/03/20 0606  INR 3.2* 2.1* 2.7* 2.1* 1.4*   Cardiac Enzymes: No results for input(s): CKTOTAL, CKMB, CKMBINDEX, TROPONINI in the last 168 hours. BNP (last 3 results) Recent Labs     12/19/19 1546 02/09/20 0825  PROBNP 1,148* 1,185*   HbA1C: No results for input(s): HGBA1C in the last 72 hours. CBG: Recent Labs  Lab 03/02/20 1327 03/02/20 1758 03/02/20 2126 03/03/20 0117 03/03/20 0813  GLUCAP 218* 217* 262* 201* 129*   Lipid Profile: Recent Labs    03/01/20 0805  CHOL 116  HDL 22*  LDLCALC 77  TRIG 83  CHOLHDL 5.3   Thyroid Function Tests: No results for input(s): TSH, T4TOTAL, FREET4, T3FREE, THYROIDAB in the last 72 hours. Anemia Panel: No results for input(s): VITAMINB12, FOLATE, FERRITIN, TIBC, IRON, RETICCTPCT in the last 72 hours.  Sepsis Labs: Recent Labs  Lab 02/26/20 0344  LATICACIDVEN 1.5    Recent Results (from the past 240 hour(s))  Blood Culture (routine x 2)     Status: Abnormal   Collection Time: 02/23/20  9:52 AM   Specimen: BLOOD  Result Value Ref Range Status   Specimen Description   Final    BLOOD RIGHT ANTECUBITAL Performed at [facility] [address] [place], [place] [address]    Special Requests   Final    BOTTLES DRAWN AEROBIC AND ANAEROBIC Blood Culture adequate volume Performed at [facility] [address] [place], [place] [address]    Culture  Setup Time   Final    GRAM NEGATIVE RODS IN BOTH AEROBIC AND ANAEROBIC BOTTLES CRITICAL RESULT CALLED TO, READ BACK BY AND VERIFIED WITH: Irwin Brakeman 0867 02/24/2020 Mena Goes Performed at [facility], [place] [address] [place], [place] [address]    Culture SERRATIA MARCESCENS (A)  Final   Report Status 02/26/2020 FINAL  Final   Organism ID, Bacteria SERRATIA MARCESCENS  Final      Susceptibility   Serratia marcescens - MIC*    CEFAZOLIN >=64 RESISTANT Resistant     CEFEPIME <=0.12 SENSITIVE Sensitive     CEFTAZIDIME <=1 SENSITIVE Sensitive     CEFTRIAXONE <=0.25 SENSITIVE Sensitive     CIPROFLOXACIN <=0.25 SENSITIVE Sensitive     GENTAMICIN <=1 SENSITIVE Sensitive     TRIMETH/SULFA <=20 SENSITIVE Sensitive      * SERRATIA MARCESCENS  Blood Culture ID Panel (Reflexed)     Status: Abnormal   Collection Time: 02/23/20  9:52 AM  Result Value Ref Range Status   Enterococcus faecalis NOT DETECTED NOT DETECTED Final   Enterococcus Faecium NOT DETECTED NOT DETECTED Final   Listeria monocytogenes NOT DETECTED NOT DETECTED Final   Staphylococcus species NOT DETECTED NOT DETECTED Final   Staphylococcus aureus (BCID) NOT DETECTED NOT DETECTED Final   Staphylococcus epidermidis NOT DETECTED NOT DETECTED Final   Staphylococcus lugdunensis NOT DETECTED NOT DETECTED Final   Streptococcus species NOT DETECTED NOT DETECTED Final   Streptococcus agalactiae NOT DETECTED NOT DETECTED Final   Streptococcus pneumoniae NOT DETECTED NOT DETECTED Final   Streptococcus pyogenes NOT DETECTED NOT DETECTED Final   A.calcoaceticus-baumannii NOT DETECTED NOT DETECTED Final   Bacteroides fragilis NOT DETECTED NOT DETECTED Final   Enterobacterales DETECTED (A) NOT DETECTED Final    Comment: Enterobacterales represent a large order of gram negative bacteria, not a single organism. CRITICAL RESULT CALLED TO, READ BACK BY AND VERIFIED WITH: M. LILLISTON,PHARMD 0257 02/24/2020 T. TYSOR    Enterobacter cloacae complex NOT DETECTED NOT DETECTED Final   Escherichia coli NOT DETECTED NOT DETECTED Final   Klebsiella aerogenes NOT DETECTED NOT DETECTED Final   Klebsiella oxytoca NOT DETECTED NOT DETECTED Final   Klebsiella pneumoniae NOT DETECTED NOT DETECTED Final   Proteus species NOT DETECTED NOT DETECTED Final   Salmonella species NOT DETECTED NOT DETECTED Final   Serratia marcescens DETECTED (A) NOT DETECTED Final    Comment: CRITICAL RESULT CALLED TO, READ BACK BY AND VERIFIED WITH: M. LILLISTON,PHARMD 0257 02/24/2020 T. TYSOR    Haemophilus influenzae NOT DETECTED NOT DETECTED Final   Neisseria meningitidis NOT DETECTED NOT DETECTED Final   Pseudomonas aeruginosa NOT DETECTED NOT DETECTED Final   Stenotrophomonas  maltophilia NOT DETECTED NOT DETECTED Final   Candida albicans NOT DETECTED NOT DETECTED Final   Candida auris NOT DETECTED NOT DETECTED Final   Candida glabrata  NOT DETECTED NOT DETECTED Final   Candida krusei NOT DETECTED NOT DETECTED Final   Candida parapsilosis NOT DETECTED NOT DETECTED Final   Candida tropicalis NOT DETECTED NOT DETECTED Final   Cryptococcus neoformans/gattii NOT DETECTED NOT DETECTED Final   CTX-M ESBL NOT DETECTED NOT DETECTED Final   Carbapenem resistance IMP NOT DETECTED NOT DETECTED Final   Carbapenem resistance KPC NOT DETECTED NOT DETECTED Final   Carbapenem resistance NDM NOT DETECTED NOT DETECTED Final   Carbapenem resist OXA 48 LIKE NOT DETECTED NOT DETECTED Final   Carbapenem resistance VIM NOT DETECTED NOT DETECTED Final    Comment: Performed at [facility], [place] [address], [place], [place] [address]  Respiratory Panel by RT PCR (Flu A&B, Covid) - Nasopharyngeal Swab     Status: None   Collection Time: 02/23/20  9:55 AM   Specimen: Nasopharyngeal Swab  Result Value Ref Range Status   SARS Coronavirus 2 by RT PCR NEGATIVE NEGATIVE Final    Comment: (NOTE) SARS-CoV-2 target nucleic acids are NOT DETECTED.  The SARS-CoV-2 RNA is generally detectable in upper respiratoy specimens during the acute phase of infection. The lowest concentration of SARS-CoV-2 viral copies this assay can detect is 131 copies/mL. A negative result does not preclude SARS-Cov-2 infection and should not be used as the sole basis for treatment or other patient management decisions. A negative result may occur with  improper specimen collection/handling, submission of specimen other than nasopharyngeal swab, presence of viral mutation(s) within the areas targeted by this assay, and inadequate number of viral copies (<131 copies/mL). A negative result must be combined with clinical observations, patient history, and epidemiological information. The expected result is  Negative.  Fact Sheet for Patients:  PinkCheek.be  Fact Sheet for Healthcare Providers:  GravelBags.it  This test is no t yet approved or cleared by the [place] FDA and  has been authorized for detection and/or diagnosis of SARS-CoV-2 by FDA under an Emergency Use Authorization (EUA). This EUA will remain  in effect (meaning this test can be used) for the duration of the COVID-19 declaration under Section 564(b)(1) of the Act, 21 U.S.C. section 360bbb-3(b)(1), unless the authorization is terminated or revoked sooner.     Influenza A by PCR NEGATIVE NEGATIVE Final   Influenza B by PCR NEGATIVE NEGATIVE Final    Comment: (NOTE) The Xpert Xpress SARS-CoV-2/FLU/RSV assay is intended as an aid in  the diagnosis of influenza from Nasopharyngeal swab specimens and  should not be used as a sole basis for treatment. Nasal washings and  aspirates are unacceptable for Xpert Xpress SARS-CoV-2/FLU/RSV  testing.  Fact Sheet for Patients: PinkCheek.be  Fact Sheet for Healthcare Providers: GravelBags.it  This test is not yet approved or cleared by the [place] FDA and  has been authorized for detection and/or diagnosis of SARS-CoV-2 by  FDA under an Emergency Use Authorization (EUA). This EUA will remain  in effect (meaning this test can be used) for the duration of the  Covid-19 declaration under Section 564(b)(1) of the Act, 21  U.S.C. section 360bbb-3(b)(1), unless the authorization is  terminated or revoked. Performed at [facility], [place] [address] [place], [place] [address]   Blood Culture (routine x 2)     Status: None   Collection Time: 02/23/20  9:57 AM   Specimen: BLOOD RIGHT FOREARM  Result Value Ref Range Status   Specimen Description   Final    BLOOD RIGHT FOREARM Performed at [facility], [address], [place],  [place] [address]    Special Requests   Final    BOTTLES DRAWN AEROBIC AND ANAEROBIC Blood Culture results may not be optimal due to an inadequate volume of blood received in culture bottles Performed at [facility] [address] [place], [place] [address]    Culture   Final    NO GROWTH 5 DAYS Performed at [facility], [place] [address], [place], [place] [address]    Report Status 02/28/2020 FINAL  Final  Urine culture     Status: Abnormal   Collection Time: 02/23/20 12:09 PM   Specimen: In/Out Cath Urine  Result Value Ref Range Status   Specimen Description   Final    IN/OUT CATH URINE Performed at [facility] [address] [place], [place] [address]    Special Requests   Final    NONE Performed at [facility], [place] [address] [place], [place] [address]    Culture (A)  Final    <10,000 COLONIES/mL INSIGNIFICANT GROWTH Performed at [facility] [address], [place], [place] [address]    Report Status 02/24/2020 FINAL  Final  MRSA PCR Screening     Status: None   Collection Time: 02/25/20  7:26 AM   Specimen: Nasal Mucosa; Nasopharyngeal  Result Value Ref Range Status   MRSA by PCR NEGATIVE NEGATIVE Final    Comment:        The GeneXpert MRSA Assay (FDA approved for NASAL specimens only), is one component of a comprehensive MRSA colonization surveillance program. It is not intended to diagnose MRSA infection nor to guide or monitor treatment for MRSA infections. Performed at [facility], [place] [address], [place], [place] [address]          Radiology Studies: ECHOCARDIOGRAM COMPLETE  Result Date: 03/01/2020    ECHOCARDIOGRAM REPORT   Patient Name:   MYNA FREIMARK [facility] Date of Exam: 03/01/2020 Medical Rec #:  [id number]            Height:       66.0 in Accession #:    [id number]           Weight:       199.0 lb Date of Birth:  07/30/1960             BSA:          1.996 m Patient Age:    89  years             BP:           144/84 mmHg Patient Gender: F                    HR:           89 bpm. Exam Location:  Inpatient Procedure: 2D Echo, Cardiac Doppler and Color Doppler Indications:    Stroke 434.91 / I163.9  History:        Patient has prior history of Echocardiogram examinations, most                 recent 10/13/2019. CHF, COPD; Risk Factors:Hypertension,                 Dyslipidemia and Diabetes.  Sonographer:    Bernadene Person RDCS Referring Phys: [id number] DENISE A WOLFE  Sonographer Comments: Patient refused half way through exam IMPRESSIONS  1. Unable to fully assess EF and wall motion as patient  terminated the exam early and we needed definity contrast to delineate the LV endocardium. Based on limited visualization, the patient appears to have mildly-to-moderately reduced LV systolic function with LVEF ~40%. On short axis views, the anteior and septal walls appear moderately hypokinetic. Again, definity contrast would help delineate this further as visualization is very limited.  2. Left ventricular diastolic parameters are consistent with Grade II diastolic dysfunction (pseudonormalization). Elevated left atrial pressure.  3. Right ventricular systolic function is at least mildly reduced. Patient terminated exam prior to full evaluation of RV systolic function. The right ventricular size is mildly enlarged.  4. The mitral valve is normal in structure. Trivial mitral valve regurgitation.  5. The aortic valve is tricuspid. There is mild thickening of the aortic valve. Aortic valve regurgitation is trivial. Comparison(s): Compared to prior TTE in 09/22/19, this exam is much more limited, however, LVEF appears to have decreased to ~40%. Conclusion(s)/Recommendation(s): No intracardiac source of embolism detected on this transthoracic study. A transesophageal echocardiogram is recommended to exclude cardiac source of embolism if clinically indicated. FINDINGS  Left Ventricle: Unable to fully assess EF  and wall motion as patient terminated the exam early and we needed definity contrast to delineate the LV endocardium. Based on limited visualization, the patient appears to have mildly-to-moderately reduced LV systolic function with LVEF ~40%. On short axis views, the anteior and septal walls appear moderately hypokinetic. The left ventricular internal cavity size was normal in size. There is borderline concentric left ventricular hypertrophy. Left ventricular  diastolic parameters are consistent with Grade II diastolic dysfunction (pseudonormalization). Elevated left atrial pressure. The E/e' is 21.5. Again, definity contrast would help delineate this further as visualization is very limited. Right Ventricle: The right ventricular size is mildly enlarged. Right vetricular wall thickness was not well visualized. Right ventricular systolic function is at least mildly reduced. Test terminated by the patient prior to full evaluation of RV systolic function. Left Atrium: Left atrial size was normal in size. Right Atrium: Right atrial size was normal in size. Pericardium: There is no evidence of pericardial effusion. Mitral Valve: The mitral valve is normal in structure. There is mild thickening of the mitral valve leaflet(s). There is mild calcification of the mitral valve leaflet(s). Trivial mitral valve regurgitation. Tricuspid Valve: The tricuspid valve is normal in structure. Tricuspid valve regurgitation is trivial. Aortic Valve: The aortic valve is tricuspid. There is mild thickening of the aortic valve. Aortic valve regurgitation is trivial. Pulmonic Valve: The pulmonic valve was normal in structure. Pulmonic valve regurgitation is trivial. Aorta: The aortic root and ascending aorta are structurally normal, with no evidence of dilitation. IAS/Shunts: The atrial septum is grossly normal.  LEFT VENTRICLE PLAX 2D LVIDd:         5.10 cm  Diastology LVIDs:         3.70 cm  LV e' medial:    4.70 cm/s LV PW:          0.90 cm  LV E/e' medial:  21.5 LV IVS:        0.80 cm  LV e' lateral:   5.59 cm/s LVOT diam:     2.00 cm  LV E/e' lateral: 18.1 LV SV:         41 LV SV Index:   20 LVOT Area:     3.14 cm  LEFT ATRIUM           Index       RIGHT ATRIUM  Index LA diam:      4.10 cm 2.05 cm/m  RA Area:     18.20 cm LA Vol (A4C): 65.7 ml 32.92 ml/m RA Volume:   54.80 ml  27.46 ml/m  AORTIC VALVE LVOT Vmax:   77.30 cm/s LVOT Vmean:  51.400 cm/s LVOT VTI:    0.129 m  AORTA Ao Root diam: 2.70 cm Ao Asc diam:  2.90 cm MITRAL VALVE                TRICUSPID VALVE MV Area (PHT): 5.38 cm     TR Peak grad:   17.5 mmHg MV Decel Time: 141 msec     TR Vmax:        209.00 cm/s MV E velocity: 101.00 cm/s MV A velocity: 71.80 cm/s   SHUNTS MV E/A ratio:  1.41         Systemic VTI:  0.13 m                             Systemic Diam: 2.00 cm Morgan Kaufman MD Electronically signed by Morgan Kaufman MD Signature Date/Time: 03/01/2020/11:55:54 AM    Final         Scheduled Meds: . Chlorhexidine Gluconate Cloth  6 each Topical Daily  . clopidogrel  75 mg Oral Daily  . collagenase   Topical Daily  . feeding supplement  237 mL Oral TID BM  . fluticasone furoate-vilanterol  1 puff Inhalation Daily  . heparin injection (subcutaneous)  5,000 Units Subcutaneous Q8H  . insulin aspart  0-9 Units Subcutaneous Q6H  . insulin glargine  20 Units Subcutaneous Daily  . lipase/protease/amylase  72,000 Units Oral TID WC  . multivitamin with minerals  1 tablet Oral Daily  . nicotine  7 mg Transdermal Daily  . pantoprazole  40 mg Oral Daily   Continuous Infusions: . ceFEPime (MAXIPIME) IV 2 g (03/03/20 0615)  . lactated ringers Stopped (02/26/20 1041)     LOS: 9 days    Time spent: 35 mins.More than 50% of that time was spent in counseling and/or coordination of care.      Morgan Coss, MD [facility] P11/06/2019, 9:45 AM

## 2020-03-03 NOTE — TOC Progression Note (Signed)
Transition of Care [facility]) - Progression Note    Patient Details  Name: Morgan Beasley MRN: [id number] Date of Birth: [birth year]  Transition of Care [facility]) CM/SW [place], [place] Work Phone Number: 03/03/2020, 9:57 AM  Clinical Narrative:     CSW Intern spoke to the patient to inform her of SNF's that have accepted her. Patient appeared to be a little disoriented and indicated that she wanted to go home. CSW intern then spoke with patient's daughter who informed me that no one would be able to assist her at home and that their first choice for SNF would be [facility]. The daughter indicated that patient has a C-Pap at home and has had both COVID vaccines. If the patient does go to SNF the daughter said that she would be able to bring the C-pap to the SNF. Will obtain insurance auth and updated COVID test once patient is medically stable.  Expected Discharge Plan: [place] Barriers to Discharge: Continued Medical Work up, SNF Pending bed offer  Expected Discharge Plan and Services Expected Discharge Plan: [place] Choice: [place] arrangements for the past 2 months: Single Family Home                                       Social Determinants of Health (SDOH) Interventions    Readmission Risk Interventions Readmission Risk Prevention Plan 10/21/2019  Transportation Screening Complete  Medication Review Press photographer) Complete  PCP or Specialist appointment within 3-5 days of discharge Complete  HRI or [place] Complete  SW Recovery Care/Counseling Consult Complete  [place] Not Applicable  Some recent data might be hidden

## 2020-03-03 NOTE — Progress Notes (Signed)
   [facility] has been requested to perform a transesophageal echocardiogram on Morgan Beasley for stroke.  After careful review of history and examination, the risks and benefits of transesophageal echocardiogram have been explained including risks of esophageal damage, perforation (1:10,000 risk), bleeding, pharyngeal hematoma as well as other potential complications associated with conscious sedation including aspiration, arrhythmia, respiratory failure and death. Alternatives to treatment were discussed, questions were answered.   Patient has altered mental status secondary to stroke and is not fully oriented. She is unable to consent at this time. Therefore, I called and spoke to her daughters [name] and Jari Sportsman. Described procedure and risk/benefits as above. Both daughters agreed to the procedure. Consent was confirmed by RN Raelene Bott. Consent form signed and placed in paper chart.   Darreld Mclean, PA-C 03/03/2020 4:22 PM

## 2020-03-03 NOTE — Progress Notes (Signed)
   Wound on left foot does not appear to be actively infected.  I have ordered ABIs.  Patient remains confused I believe the only intervention I could offer would be below-knee amputation she is certainly unable to consent to this at this time.  More than likely should be okay for outpatient evaluation of the ulcer.  Meiko Stranahan C. Donzetta Matters, MD [facility] [place] Office: [phone] Pager: [phone]

## 2020-03-03 NOTE — H&P (View-Only) (Signed)
STROKE TEAM PROGRESS NOTE   INTERVAL HISTORY RN at bedside.  Patient lying in bed, comfortably, more awake alert, interactive.  Denies any discomfort.  Pending TEE tomorrow.  Vitals:   03/03/20 0414 03/03/20 0750 03/03/20 0805 03/03/20 1142  BP: (!) 147/76 100/85  (!) 145/108  Pulse: 99 96 94 (!) 108  Resp: (!) 24 20 18 19   Temp: 97.8 F (36.6 C) 98.7 F (37.1 C)  97.6 F (36.4 C)  TempSrc: Axillary Oral  Oral  SpO2: 100% 97% 97% 94%  Weight:      Height:       CBC:  Recent Labs  Lab 03/02/20 0431 03/03/20 0606  WBC 5.8 8.0  NEUTROABS 3.7 4.9  HGB 8.1* 10.5*  HCT 26.4* 34.0*  MCV 81.5 78.9*  PLT 337 676*   Basic Metabolic Panel:  Recent Labs  Lab 03/02/20 0431 03/03/20 0606  NA 135 136  K 3.3* 4.1  CL 124* 104  CO2 18* 25  GLUCOSE 104* 144*  BUN 20 18  CREATININE 1.49* 1.49*  CALCIUM 6.2* 8.5*  MG 1.4* 2.0   Lipid Panel:  Recent Labs  Lab 03/01/20 0805  CHOL 116  TRIG 83  HDL 22*  CHOLHDL 5.3  VLDL 17  LDLCALC 77   HgbA1c:  No results for input(s): HGBA1C in the last 168 hours. Urine Drug Screen: No results for input(s): LABOPIA, COCAINSCRNUR, LABBENZ, AMPHETMU, THCU, LABBARB in the last 168 hours.  Alcohol Level No results for input(s): ETH in the last 168 hours.  IMAGING past 24 hours No results found.  PHYSICAL EXAM   Temp:  [97.6 F (36.4 C)-98.7 F (37.1 C)] 97.6 F (36.4 C) (11/03 1142) Pulse Rate:  [94-108] 108 (11/03 1142) Resp:  [17-24] 19 (11/03 1142) BP: (100-147)/(76-108) 145/108 (11/03 1142) SpO2:  [93 %-100 %] 94 % (11/03 1142)  General - Well nourished, well developed, in no apparent distress.  Ophthalmologic - fundi not visualized due to noncooperation.  Cardiovascular - Regular rhythm and rate.  Neuro - awake alert, eyes open, more interactive than yesterday. Orientated to self and place, however, not orientated to age or time. Moderate dysarthria, paucity of speech, but no aphasia, able to name 2/4 and repeat in  dysarthric voice. Visual field full, no gaze preference, facial symmetrical, tongue midline. PERRL. BUE 3/5 with asterixis, BLE proximal 2/5 and knee flexion 3-/5 and ankle PF 3/5. Left foot unhealing wound with pus. Sensation subjectively symmetrical. B/l FTN slow but grossly intact. Gait not tested.  ASSESSMENT/PLAN [name] is a 59 y.o. female with history of prior substance abuse (THC, cocaine),  obesity, poorly controlled diabetes mellitus, CAD, systolic/diastolic CHF, ongoing tobacco abuse, stage II CKD, COPD not on home oxygen, OSA on nightly BiPAP, hypertension, PVD/PAD status post angioplasty and stent placement and  left femoral to below-knee popliteal bypass 09/2019 with nonhealing left foot ulcerwho  presenting with L leg and food pain. CT done for encephalopathy revealed a subacute R occipital infarct.   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    MRI  R occipital infarct w/ paretotemporal extension, chronic L occipital infarct   MRA head and neck bilateral siphon and right PCA stenosis  2D Echo EF 40%. No source of embolus   TEE Thurs 11/4 at 0800 to look for embolic source of stroke  LDL 77  HgbA1c 15.1  VTE prophylaxis - Heparin 5000  units sq tid   clopidogrel 75 mg daily prior to admission, now on clopidogrel 75 mg daily.  Avoid DAPT for now until rule out endocarditis.  Therapy recommendations:  SNF   Disposition:  pending   Septic shock   d/t serratia bacteremia   d/t BLE PAD w/ chronic LLE non-healing ucler in a DM   BP stable now  On cefepime  Transaminitis   Chronic pancreatitis d/t ALCOHOL use  elevated LTFs but trending down  Surgery on board - low suspicious for cholecystitis - signed off  INR 3.2->2.7->2.1->1.4 - on VitK   Hold off statin for now  Hypotension Hx of hypertension  Now stable . Long-term  BP goal normotensive  Hyperlipidemia  Home meds:  crestor 40 and zetia  LDL 77, goal < 70  No statin d/t elevated LFTs  Consider to restart statin once LFTs normalize  Diabetes type II Uncontrolled  HgbA1c 15.1, goal < 7.0  CBGs  SSI  On Lantus  DB RN following  Tobacco abuse  Current smoker  Smoking cessation counseling provided  Pt is willing to quit  On nicotine patch  Other Stroke Risk Factors  ETOH use, advised to drink no more than 2 drink(s) a day  Hx Substance abuse - UDS / ETOH level not performed, chronic narcotic use.     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 For OP bx  COPD   Possible underlying psychiatric d/o  Macrocytic anemia of chronic dz, Hb 9.9->8.5->8.1->10.5  Hypokalemia  AKI on CKD stage 3a, Cre 1.87->1.49->1.49  Hospital day # 9  Rosalin Hawking, MD PhD Stroke Neurology 03/03/2020 2:37 PM    To contact Stroke Continuity provider, please refer to [email or web address] After hours, contact General Neurology

## 2020-03-04 ENCOUNTER — Encounter (HOSPITAL_COMMUNITY)
Admission: EM | Disposition: A | Discharge status: Home Health | Payer: Self-pay | Source: Home / Self Care | Attending: Family Medicine

## 2020-03-04 ENCOUNTER — Encounter (HOSPITAL_COMMUNITY): Payer: Self-pay | Admitting: Internal Medicine

## 2020-03-04 ENCOUNTER — Inpatient Hospital Stay (HOSPITAL_COMMUNITY): Payer: Medicare Other

## 2020-03-04 ENCOUNTER — Inpatient Hospital Stay (HOSPITAL_COMMUNITY): Payer: Medicare Other | Admitting: Certified Registered"

## 2020-03-04 ENCOUNTER — Other Ambulatory Visit: Payer: Self-pay

## 2020-03-04 DIAGNOSIS — R7401 Elevation of levels of liver transaminase levels: Secondary | ICD-10-CM

## 2020-03-04 DIAGNOSIS — L97529 Non-pressure chronic ulcer of other part of left foot with unspecified severity: Secondary | ICD-10-CM | POA: Diagnosis not present

## 2020-03-04 DIAGNOSIS — I10 Essential (primary) hypertension: Secondary | ICD-10-CM | POA: Diagnosis not present

## 2020-03-04 DIAGNOSIS — N179 Acute kidney failure, unspecified: Secondary | ICD-10-CM

## 2020-03-04 DIAGNOSIS — A419 Sepsis, unspecified organism: Secondary | ICD-10-CM | POA: Diagnosis not present

## 2020-03-04 DIAGNOSIS — I63431 Cerebral infarction due to embolism of right posterior cerebral artery: Secondary | ICD-10-CM | POA: Diagnosis not present

## 2020-03-04 DIAGNOSIS — R6521 Severe sepsis with septic shock: Secondary | ICD-10-CM | POA: Diagnosis not present

## 2020-03-04 DIAGNOSIS — I739 Peripheral vascular disease, unspecified: Secondary | ICD-10-CM

## 2020-03-04 DIAGNOSIS — I5022 Chronic systolic (congestive) heart failure: Secondary | ICD-10-CM | POA: Diagnosis not present

## 2020-03-04 DIAGNOSIS — I639 Cerebral infarction, unspecified: Secondary | ICD-10-CM | POA: Diagnosis not present

## 2020-03-04 HISTORY — PX: BUBBLE STUDY: SHX6837

## 2020-03-04 HISTORY — PX: TEE WITHOUT CARDIOVERSION: SHX5443

## 2020-03-04 LAB — GLUCOSE, CAPILLARY
Glucose-Capillary: 127 mg/dL — ABNORMAL HIGH (ref 70–99)
Glucose-Capillary: 101 mg/dL — ABNORMAL HIGH (ref 70–99)
Glucose-Capillary: 180 mg/dL — ABNORMAL HIGH (ref 70–99)
Glucose-Capillary: 151 mg/dL — ABNORMAL HIGH (ref 70–99)

## 2020-03-04 LAB — COMPREHENSIVE METABOLIC PANEL
ALT: 138 U/L — ABNORMAL HIGH (ref 0–44)
AST: 65 U/L — ABNORMAL HIGH (ref 15–41)
Albumin: 1.7 g/dL — ABNORMAL LOW (ref 3.5–5.0)
Alkaline Phosphatase: 292 U/L — ABNORMAL HIGH (ref 38–126)
Anion gap: 8 (ref 5–15)
BUN: 15 mg/dL (ref 6–20)
CO2: 27 mmol/L (ref 22–32)
Calcium: 8.5 mg/dL — ABNORMAL LOW (ref 8.9–10.3)
Chloride: 104 mmol/L (ref 98–111)
Creatinine, Ser: 1.28 mg/dL — ABNORMAL HIGH (ref 0.44–1.00)
GFR, Estimated: 49 mL/min — ABNORMAL LOW (ref >60)
Glucose, Bld: 126 mg/dL — ABNORMAL HIGH (ref 70–99)
Potassium: 3.6 mmol/L (ref 3.5–5.1)
Sodium: 139 mmol/L (ref 135–145)
Total Bilirubin: 0.7 mg/dL (ref 0.3–1.2)
Total Protein: 6.3 g/dL — ABNORMAL LOW (ref 6.5–8.1)

## 2020-03-04 LAB — PROTIME-INR
INR: 1.3 — ABNORMAL HIGH (ref 0.8–1.2)
Prothrombin Time: 15.9 seconds — ABNORMAL HIGH (ref 11.4–15.2)

## 2020-03-04 LAB — CBC WITH DIFFERENTIAL/PLATELET
Abs Immature Granulocytes: 0.04 10*3/uL (ref 0.00–0.07)
Basophils Absolute: 0 10*3/uL (ref 0.0–0.1)
Basophils Relative: 0 %
Eosinophils Absolute: 0.2 10*3/uL (ref 0.0–0.5)
Eosinophils Relative: 3 %
HCT: 32.7 % — ABNORMAL LOW (ref 36.0–46.0)
Hemoglobin: 10.2 g/dL — ABNORMAL LOW (ref 12.0–15.0)
Immature Granulocytes: 1 %
Lymphocytes Relative: 23 %
Lymphs Abs: 1.7 10*3/uL (ref 0.7–4.0)
MCH: 24.8 pg — ABNORMAL LOW (ref 26.0–34.0)
MCHC: 31.2 g/dL (ref 30.0–36.0)
MCV: 79.4 fL — ABNORMAL LOW (ref 80.0–100.0)
Monocytes Absolute: 0.9 10*3/uL (ref 0.1–1.0)
Monocytes Relative: 12 %
Neutro Abs: 4.8 10*3/uL (ref 1.7–7.7)
Neutrophils Relative %: 61 %
Platelets: 413 10*3/uL — ABNORMAL HIGH (ref 150–400)
RBC: 4.12 MIL/uL (ref 3.87–5.11)
RDW: 19.1 % — ABNORMAL HIGH (ref 11.5–15.5)
WBC: 7.7 10*3/uL (ref 4.0–10.5)
nRBC: 1.8 % — ABNORMAL HIGH (ref 0.0–0.2)

## 2020-03-04 LAB — MAGNESIUM: Magnesium: 1.5 mg/dL — ABNORMAL LOW (ref 1.7–2.4)

## 2020-03-04 LAB — BRAIN NATRIURETIC PEPTIDE: B Natriuretic Peptide: 2502.5 pg/mL — ABNORMAL HIGH (ref 0.0–100.0)

## 2020-03-04 SURGERY — ECHOCARDIOGRAM, TRANSESOPHAGEAL
Anesthesia: Monitor Anesthesia Care

## 2020-03-04 MED ORDER — LORAZEPAM 0.5 MG PO TABS
0.5000 mg | ORAL_TABLET | Freq: Four times a day (QID) | ORAL | Status: DC | PRN
  Administered 2020-03-04 – 2020-03-13 (×15): 0.5 mg via ORAL
  Filled 2020-03-04 (×16): qty 1

## 2020-03-04 MED ORDER — LACTATED RINGERS IV SOLN: INTRAVENOUS | Status: DC

## 2020-03-04 MED ORDER — MAGNESIUM SULFATE 2 GM/50ML IV SOLN
2.0000 g | Freq: Once | INTRAVENOUS | Status: AC
  Administered 2020-03-04: 2 g via INTRAVENOUS
  Filled 2020-03-04: qty 50

## 2020-03-04 MED ORDER — PROPOFOL 500 MG/50ML IV EMUL
INTRAVENOUS | Status: DC | PRN
  Administered 2020-03-04: 100 ug/kg/min via INTRAVENOUS

## 2020-03-04 MED ORDER — PROPOFOL 10 MG/ML IV BOLUS
INTRAVENOUS | Status: DC | PRN
  Administered 2020-03-04 (×2): 20 mg via INTRAVENOUS

## 2020-03-04 MED ORDER — METOPROLOL SUCCINATE ER 50 MG PO TB24
50.0000 mg | ORAL_TABLET | Freq: Every day | ORAL | Status: DC
  Administered 2020-03-04 – 2020-03-18 (×13): 50 mg via ORAL
  Filled 2020-03-04 (×15): qty 1

## 2020-03-04 MED ORDER — HALOPERIDOL LACTATE 5 MG/ML IJ SOLN
5.0000 mg | Freq: Four times a day (QID) | INTRAMUSCULAR | Status: DC | PRN
  Administered 2020-03-04 – 2020-03-09 (×7): 5 mg via INTRAVENOUS
  Filled 2020-03-04 (×7): qty 1

## 2020-03-04 MED ORDER — BUTAMBEN-TETRACAINE-BENZOCAINE 2-2-14 % EX AERO
INHALATION_SPRAY | CUTANEOUS | Status: DC | PRN
  Administered 2020-03-04: 2 via TOPICAL

## 2020-03-04 MED ORDER — FUROSEMIDE 40 MG PO TABS
40.0000 mg | ORAL_TABLET | Freq: Every day | ORAL | Status: DC
  Administered 2020-03-04 – 2020-03-05 (×2): 40 mg via ORAL
  Filled 2020-03-04 (×3): qty 1

## 2020-03-04 NOTE — Transfer of Care (Signed)
Immediate Anesthesia Transfer of Care Note  Patient: Morgan Beasley  Procedure(s) Performed: TRANSESOPHAGEAL ECHOCARDIOGRAM (TEE) (N/A ) BUBBLE STUDY  Patient Location: Endoscopy Unit  Anesthesia Type:MAC  Level of Consciousness: drowsy  Airway & Oxygen Therapy: Patient Spontanous Breathing and Patient connected to face mask oxygen  Post-op Assessment: Report given to RN, Post -op Vital signs reviewed and stable and Patient moving all extremities  Post vital signs: Reviewed and stable  Last Vitals:  Vitals Value Taken Time  BP    Temp    Pulse    Resp    SpO2      Last Pain:  Vitals:   03/04/20 0741  TempSrc: Oral  PainSc: 0-No pain      Patients Stated Pain Goal: 2 (53/61/44 3154)  Complications: No complications documented.

## 2020-03-04 NOTE — Consult Note (Signed)
[place] for Infectious Disease       Reason for Consult: PV vegetation    Referring Physician: Dr. Tawanna Solo  Principal Problem:   Septic shock [facility]) Active Problems:   Uncontrolled diabetes mellitus with circulatory complication, with long-term current use of insulin (HCC)   COPD (chronic obstructive pulmonary disease) ([place])   PVD (peripheral vascular disease) ([place])   Ischemia of left lower extremity   Chronic systolic CHF (congestive heart failure) (HCC)   Obesity (BMI 30-39.9)   Cerebral embolism with cerebral infarction   . Chlorhexidine Gluconate Cloth  6 each Topical Daily  . clopidogrel  75 mg Oral Daily  . collagenase   Topical Daily  . feeding supplement  237 mL Oral TID BM  . fluticasone furoate-vilanterol  1 puff Inhalation Daily  . furosemide  40 mg Oral Daily  . heparin injection (subcutaneous)  5,000 Units Subcutaneous Q8H  . insulin aspart  0-15 Units Subcutaneous TID WC  . insulin aspart  0-5 Units Subcutaneous QHS  . insulin glargine  20 Units Subcutaneous Daily  . lipase/protease/amylase  72,000 Units Oral TID WC  . multivitamin with minerals  1 tablet Oral Daily  . nicotine  7 mg Transdermal Daily  . pantoprazole  40 mg Oral Daily    Recommendations: Continue with cefepime   Assessment: She has an acute infarct in one location and TEE with a PV vegetation in the setting of 1 blood culture set positive for Serratia marcescens.   The location of the vegetation makes an infectious etiology of the CNS emboli unlikely and the Serratia is not typical for endocarditis. Will wait for the full read of the TEE.     Antibiotics: cefepime  HPI: Chazlyn Cude is a 59 y.o. female with a history of poorly controlled DM with Hgb A1c of 15.1, CAD, CHF, PVD s/p angioplasty with new embolic stroke.  MRI noted an acute infarct of the lateral right occipital lobe with some extension.  TEE done looking for embolic source and a vegetation was noted on the  PV.  Nothing on the left side.  She has a blood culture positive for Serratia and an open wound on her left foot that has been poorly healing.   Review of Systems:  Constitutional: negative for fevers and chills Gastrointestinal: negative for diarrhea Integument/breast: negative for rash All other systems reviewed and are negative    Past Medical History:  Diagnosis Date  . Alcohol dependence ([place])   . Anemia   . Anxiety   . Breast cancer ([place])   . Chronic combined systolic and diastolic CHF (congestive heart failure) ([place])   . Cigarette nicotine dependence   . CKD (chronic kidney disease), stage III ([place])   . Colon polyps   . COPD (chronic obstructive pulmonary disease) ([place])   . Diabetes mellitus without complication ([place])   . Diabetic neuropathy ([place])   . Gout   . Hyperlipemia   . Hypertension   . Insomnia   . Lymphedema   . Marijuana use   . Mild CAD 2016   a. NSTEMI 2016 in context of cocaine abuse, 50% RCA% at that time.  . OSA treated with BiPAP   . PAD (peripheral artery disease) ([place])    a. s/p L SFA stenting 08/2019. b. left fem-to-below-knee-popliteal bypass 09/2019.  Marland Kitchen Pancreatitis    acute on chronic due to ETOH initially.    . Sleep apnea    wears BIPAP  . Ulcer of foot ([place])  right    Social History   Tobacco Use  . Smoking status: Light Tobacco Smoker    Types: Cigarettes  . Smokeless tobacco: Never Used  . Tobacco comment: 3 cigarettes a day  Vaping Use  . Vaping Use: Never used  Substance Use Topics  . Alcohol use: Not Currently    Comment: Beer - "on the weekends hanging out, maybe 2 or 3"   . Drug use: Not Currently    Types: Marijuana    Comment: last used 8 2020    Family History  Problem Relation Age of Onset  . Diabetes Other   . Heart disease Other   . Breast cancer Maternal Grandmother   . Breast cancer Paternal Grandmother   . Stroke Son   . Colon cancer Neg Hx   . Esophageal cancer Neg Hx   . Rectal cancer Neg Hx   .  Stomach cancer Neg Hx     Allergies  Allergen Reactions  . Tramadol Swelling  . Nsaids Other (See Comments)    Pancreatitis  . Tolmetin Other (See Comments)    Pancreatitis  . Tylenol [Acetaminophen] Other (See Comments)    unknown  . Aspirin Other (See Comments)    "Makes my pancreas act up"     Physical Exam: Constitutional: in no apparent distress  Vitals:   03/04/20 0915 03/04/20 1200  BP: (!) 148/114 (!) 150/103  Pulse: (!) 103 (!) 103  Resp: (!) 36 (!) 22  Temp:  97.9 F (36.6 C)  SpO2: 100%    EYES: anicteric Cardiovascular: Cor RRR Respiratory: clear; Musculoskeletal: no pedal edema noted Skin: negatives: no rash Neuro: non-focal  Lab Results  Component Value Date   WBC 7.7 03/04/2020   HGB 10.2 (L) 03/04/2020   HCT 32.7 (L) 03/04/2020   MCV 79.4 (L) 03/04/2020   PLT 413 (H) 03/04/2020    Lab Results  Component Value Date   CREATININE 1.28 (H) 03/04/2020   BUN 15 03/04/2020   NA 139 03/04/2020   K 3.6 03/04/2020   CL 104 03/04/2020   CO2 27 03/04/2020    Lab Results  Component Value Date   ALT 138 (H) 03/04/2020   AST 65 (H) 03/04/2020   ALKPHOS 292 (H) 03/04/2020     Microbiology: Recent Results (from the past 240 hour(s))  MRSA PCR Screening     Status: None   Collection Time: 02/25/20  7:26 AM   Specimen: Nasal Mucosa; Nasopharyngeal  Result Value Ref Range Status   MRSA by PCR NEGATIVE NEGATIVE Final    Comment:        The GeneXpert MRSA Assay (FDA approved for NASAL specimens only), is one component of a comprehensive MRSA colonization surveillance program. It is not intended to diagnose MRSA infection nor to guide or monitor treatment for MRSA infections. Performed at [facility], [place] [address] [place], [place] [address]     Sema Stangler W Xitlally [place], [place] for Infectious Disease [facility] [email or web address] 03/04/2020, 1:39 PM

## 2020-03-04 NOTE — CV Procedure (Signed)
INDICATIONS: Stroke, endocarditis  PROCEDURE:   Informed consent was obtained prior to the procedure. The risks, benefits and alternatives for the procedure were discussed and the patient comprehended these risks.  Risks include, but are not limited to, cough, sore throat, vomiting, nausea, somnolence, esophageal and stomach trauma or perforation, bleeding, low blood pressure, aspiration, pneumonia, infection, trauma to the teeth and death.    After a procedural time-out, the oropharynx was anesthetized with 20% benzocaine spray.   During this procedure the patient was administered propofol per anesthesia Olivia CNRA.  The patient's heart rate, blood pressure, and oxygen saturation were monitored continuously during the procedure. The period of conscious sedation was 45 minutes, of which I was present face-to-face 100% of this time.  The transesophageal probe was inserted in the esophagus and stomach without difficulty and multiple views were obtained.  The patient was kept under observation until the patient left the procedure room.  The patient left the procedure room in stable condition.   Agitated microbubble saline contrast was administered.  COMPLICATIONS:    There were no immediate complications.  FINDINGS:   FORMAL ECHOCARDIOGRAM REPORT PENDING LVEF approximately 40% RV function moderately reduced Trivial AR, PR, MR, TR No PFO by color flow, poor injection of agitated saline for assessment.  Probable pulmonary valve vegetation.  There is a mobile heterogeneous echodensity attached to the transvenous catheter, seen in the mid and distal SVC, suggestive of catheter related vegetation or thrombus.  RECOMMENDATIONS:    Can return to hospital room when stable.   Time Spent Directly with the Patient:  60 minutes   Elouise Munroe 03/04/2020, 8:58 AM

## 2020-03-04 NOTE — Progress Notes (Addendum)
  Echocardiogram Echocardiogram Transesophageal has been performed.  Yukio Bisping G Oneita Allmon 03/04/2020, 11:18 AM

## 2020-03-04 NOTE — Interval H&P Note (Signed)
History and Physical Interval Note:  03/04/2020 7:47 AM  Morgan Beasley  has presented today for surgery, with the diagnosis of STROKE.  The various methods of treatment have been discussed with the patient and family. After consideration of risks, benefits and other options for treatment, the patient has consented to  Procedure(s): TRANSESOPHAGEAL ECHOCARDIOGRAM (TEE) (N/A) as a surgical intervention.  The patient's history has been reviewed, patient examined, no change in status, stable for surgery.  I have reviewed the patient's chart and labs.  Questions were answered to the patient's satisfaction.     Elouise Munroe

## 2020-03-04 NOTE — Progress Notes (Signed)
PT Cancellation Note  Patient Details Name: Morgan Beasley MRN: [id number] DOB: [birth year]   Cancelled Treatment:    Reason Eval/Treat Not Completed: Patient at procedure or test/unavailable   Morgan Beasley B Dhillon [place] 03/04/2020, 7:37 AM  Bayard Males, PT Acute Rehabilitation Services Pager: [phone] Office: [phone]

## 2020-03-04 NOTE — Anesthesia Preprocedure Evaluation (Signed)
Anesthesia Evaluation  Patient identified by MRN, date of birth, ID band Patient awake    Reviewed: Allergy & Precautions, H&P , NPO status , Patient's Chart, lab work & pertinent test results  Airway Mallampati: II   Neck ROM: full    Dental   Pulmonary sleep apnea , COPD, Current Smoker and Patient abstained from smoking.,    breath sounds clear to auscultation       Cardiovascular hypertension, + CAD, + Peripheral Vascular Disease and +CHF   Rhythm:regular Rate:Normal     Neuro/Psych PSYCHIATRIC DISORDERS Anxiety  Neuromuscular disease CVA    GI/Hepatic   Endo/Other  diabetes, Type 2  Renal/GU Renal InsufficiencyRenal disease     Musculoskeletal   Abdominal   Peds  Hematology   Anesthesia Other Findings   Reproductive/Obstetrics                             Anesthesia Physical Anesthesia Plan  ASA: III  Anesthesia Plan: MAC   Post-op Pain Management:    Induction: Intravenous  PONV Risk Score and Plan: 1 and Propofol infusion and Treatment may vary due to age or medical condition  Airway Management Planned: Nasal Cannula  Additional Equipment:   Intra-op Plan:   Post-operative Plan:   Informed Consent: I have reviewed the patients History and Physical, chart, labs and discussed the procedure including the risks, benefits and alternatives for the proposed anesthesia with the patient or authorized representative who has indicated his/her understanding and acceptance.       Plan Discussed with: CRNA, Anesthesiologist and Surgeon  Anesthesia Plan Comments:         Anesthesia Quick Evaluation

## 2020-03-04 NOTE — Progress Notes (Signed)
SLP Cancellation Note  Patient Details Name: Morgan Beasley MRN: [id number] DOB: [birth year]   Cancelled treatment:        Pt having TEE earlier then working with nursing when checked several times.    Houston Siren 03/04/2020, 11:47 AM   Orbie Pyo Colvin Caroli.Ed Risk analyst [phone] Office [phone]

## 2020-03-04 NOTE — Progress Notes (Signed)
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 She was found to have sepsis secondary to Serratia bacteremia from left lower extremity cellulitis with complicated chronic nonhealing ulcer. Hospital course remarkable for persistent lactic acidosis concerning for critical lower extremity vascular ischemia. Vascular surgery consulted and she was transferred to [facility]. During this hospitalization, she developed acute nonhemorrhagic stroke, also developed AKI and shock liver due to hypotension. Neurology following for stroke.Underwnt TEE today .   Assessment & Plan:   Principal Problem:   Septic shock ([place]) Active Problems:   Uncontrolled diabetes mellitus with circulatory complication, with long-term current use of insulin (HCC)   COPD (chronic obstructive pulmonary disease) (HCC)   PVD (peripheral vascular disease) (HCC)   Ischemia of left lower extremity   Chronic systolic CHF (congestive heart failure) (HCC)   Obesity (BMI 30-39.9)   Cerebral embolism with cerebral infarction   Septic shock/gram-negative bacteremia: Secondary to bilateral lower extremity peripheral artery disease with chronic left lower extremity nonhealing ulcer diabetic patient. Hospital course remarkable for shock, AKI.  She was treated with IV fluids. Sepsis physiology has been improving. Continue current antibiotics and wound care.ID also consulted for finding of possible pulmonary valve vegetation.  Acute nonhemorrhagic stroke: Hospital course remarkable for encephalopathy secondary to acute right occipital infarct. She developed mild left arm weakness. Neurology following. Stroke work-up in process. She has multiple risk factors including uncontrolled diabetes with hemoglobin A1c of 15, tobacco use, severe peripheral artery disease, elevated LDL. Plan to resume a statin after improvement in the liver function. On Plavix currently. Neurology following. TEE showed ejection fraction of 40%, no PFO or  cardiac source of emboli but probablepulmonary valve vegetation.  Also showed catheter thrombus. PT/OT evaluation pending.  Peripheral artery disease/nonhealing lower extremity ulcer: Vascular surgery following. She is status post left femoropopliteal bypass on 10/19/2019. On Plavix and statin at home.  Vascular surgery ordered ABI.  Chronic combined systolic/diastolic congestive heart failure: Echo done few months ago showed ejection fraction of 40 to 40% with global hypokinesis. Elevated BNP. She received fluids during this hospitalization for septic shock.  She looks volume overloaded.  She takes 40 mg of Lasix at home daily. She was given  a dose of IV Lasix 60 mg once ,started lasix 40 mg daily. Will monitor input and output.  Elevated liver enzymes: Most likely from shock liver from hypotension. Right upper quadrant ultrasound showed nonspecific changes. She was seen by general surgery, no plan for intervention. Acute hepatitis panel negative. Right upper quadrant liver Doppler stable. Questionable early NASH on right upper quadrant ultrasound.Liver enzymes improving  Electrolyte abnormalities: Continue to monitor and supplement.  Diabetes type 2/uncontrolled hyperglycemia: Hemoglobin A1c of 15. Continue current  insulin regimen. Monitor blood sugars.  Anemia of chronic disease: Currently hemoglobin is stable. Continue to monitor.  AKI on CKD stage IIIa: Baseline creatinine 1.4. Hospital course remarkable for  AKI secondary to contrast nephropathy, dehydration, hypotension. Currently kidney function at baseline.  Hyperlipidemia: On statin and Zetia. Will resume statin when liver function improves.  COPD/tobacco abuse: Counseled for smoking cessation. Currently stable in terms of COPD.  Obesity with OSA: BMI of 32. Uses BiPAP at night at home.  Hypertension: Hospital course remarkable for hypotension. Beta-blockers on hold. Monitor blood pressure.  Reactive mediastinal/hilar/right supraclavicular adenopathy: Incidental finding during this hospitalization. Outpatient biopsy recommended.      Nutrition Problem: Increased nutrient needs Etiology: wound healing      DVT prophylaxis:Heparin [place] Code Status: Full Family Communication: Talked to the daughters on phone on 03/04/2020. Status is: Inpatient  Remains inpatient appropriate because:Inpatient level of care appropriate due to severity of illness   Dispo: The patient is from: Home              Anticipated d/c is to: SNF vs Home              Anticipated d/c date is:1-2 days              Patient currently is not medically stable to d/c.   Workup in process, PT/OT evaluation pending.  Consultants: Vascular surgery, Neurology   Procedures:  Antimicrobials:  Anti-infectives (From admission, onward)   Start     Dose/Rate Route Frequency Ordered Stop   03/02/20 1800  [MAR Hold]  ceFEPIme (MAXIPIME) 2 g in sodium chloride 0.9 % 100 mL IVPB        (MAR Hold since Thu 03/04/2020 at 0734.Hold Reason: Transfer to a Procedural area.)   2 g 200 mL/hr over 30 Minutes Intravenous Every 12 hours 03/02/20 0853     02/28/20 0546  ceFEPIme (MAXIPIME) 2 g in sodium chloride 0.9 % 100 mL IVPB  Status:  Discontinued        2 g 200 mL/hr over 30  Minutes Intravenous Every 24 hours 02/27/20 1041 03/02/20 0853   02/25/20 1400  metroNIDAZOLE (FLAGYL) tablet 500 mg  Status:  Discontinued        500 mg Oral Every 8 hours 02/25/20 1025 02/29/20 0911   02/24/20 1800  ceFEPIme (MAXIPIME) 2 g in sodium chloride 0.9 % 100 mL IVPB  Status:  Discontinued        2 g 200 mL/hr over 30 Minutes Intravenous Every 12 hours 02/24/20 0732 02/27/20 1041   02/24/20 0600  ceFEPIme (MAXIPIME) 2 g in sodium chloride 0.9 % 100 mL IVPB  Status:  Discontinued        2 g 200 mL/hr over 30 Minutes Intravenous Every 8 hours 02/24/20 0440 02/24/20 0732   02/23/20 1015  vancomycin (VANCOREADY) IVPB 1750 mg/350 mL        1,750 mg 175 mL/hr over 120 Minutes Intravenous  Once 02/23/20 1007 02/23/20 1347   02/23/20 1000  cefTRIAXone (ROCEPHIN) 2 g in sodium chloride 0.9 % 100 mL IVPB  Status:  Discontinued        2 g 200 mL/hr over 30 Minutes Intravenous Every 24 hours 02/23/20 0952 02/24/20 0440   02/23/20 1000  metroNIDAZOLE (FLAGYL) IVPB 500 mg  Status:  Discontinued        500 mg 100 mL/hr over 60 Minutes Intravenous Every 8 hours 02/23/20 0952 02/25/20 1025      Subjective:  Patient seen and examined at the bedside this morning.  Hemodynamically stable during my evaluation.  Her mental status has improved and she could tell the correct month.  But she was saying that  she wants to go home today.   Objective: Vitals:   03/04/20 0014 03/04/20 0310 03/04/20 0741 03/04/20 0748  BP: 122/82 (!) 130/97 (!) 179/120 (!) 106/37  Pulse: (!) 104  (!) 112   Resp: 18 14 (!) 28   Temp: 97.6 F (36.4 C) 98.6 F (37 C) 98.4 F (36.9 C)   TempSrc: Axillary Oral Oral   SpO2: 95% 94% 93%   Weight:   86.2 kg   Height:   5\' 6"  (1.676 m)     Intake/Output Summary (Last 24 hours) at 03/04/2020 0851 Last data filed at 03/04/2020 0510 Gross per 24 hour  Intake 402.79 ml  Output 4450 ml  Net -4047.21 ml   Filed Weights   02/23/20 0937 03/04/20 0741  Weight: 90.3 kg  86.2 kg    Examination:   General exam: Not in distress, deconditioned, chronically looking Respiratory system: Bilateral diminished air sounds on the bases, no wheezes or crackles  cardiovascular system: S1 & S2 heard, RRR. No JVD, murmurs, rubs, gallops or clicks. Gastrointestinal system: Abdomen is nondistended, soft and nontender. No organomegaly or masses felt. Normal bowel sounds heard. Central nervous system: Alert and awake, oriented to place, tells correct month.  Mild left upper extremity weakness Extremities: 1-2+ lower extremity pitting edema, no clubbing ,no cyanosis Skin: Ulcer on the dorsum of the left foot     Data Reviewed: I have personally reviewed following labs and imaging studies  CBC: Recent Labs  Lab 02/29/20 0455 03/01/20 0500 03/02/20 0431 03/03/20 0606 03/04/20 0537  WBC 7.7 6.8 5.8 8.0 7.7  NEUTROABS 5.7 4.6 3.7 4.9 4.8  HGB 9.9* 8.5* 8.1* 10.5* 10.2*  HCT 32.2* 28.2* 26.4* 34.0* 32.7*  MCV 81.5 81.0 81.5 78.9* 79.4*  PLT 378 350 337 441* 001*   Basic Metabolic Panel: Recent Labs  Lab 02/29/20 0455 03/01/20 0500 03/02/20 0431 03/03/20 0606 03/04/20 0537  NA 134* 135 135 136 139  K 4.7 3.3* 3.3* 4.1 3.6  CL 104 120* 124* 104 104  CO2 23 20* 18* 25 27  GLUCOSE 80 64* 104* 144* 126*  BUN 39* 27* 20 18 15   CREATININE 2.73* 1.87* 1.49* 1.49* 1.28*  CALCIUM 7.6* 6.4* 6.2* 8.5* 8.5*  MG 2.3 1.6* 1.4* 2.0 1.5*   GFR: Estimated Creatinine Clearance: 53 mL/min (A) (by C-G formula based on SCr of 1.28 mg/dL (H)). Liver Function Tests: Recent Labs  Lab 02/29/20 0455 03/01/20 0500 03/02/20 0431 03/03/20 0606 03/04/20 0537  AST 758* 266* 121* 91* 65*  ALT 341* 213* 152* 176* 138*  ALKPHOS 290* 214* 249* 332* 292*  BILITOT 0.6 0.6 0.6 0.5 0.7  PROT 4.9* 5.4* 5.5* 6.2* 6.3*  ALBUMIN 1.4* 1.2* 1.1* 1.7* 1.7*   No results for input(s): LIPASE, AMYLASE in the last 168 hours. No results for input(s): AMMONIA in the last 168  hours. Coagulation Profile: Recent Labs  Lab 02/29/20 0455 03/01/20 0500 03/02/20 0431 03/03/20 0606 03/04/20 0537  INR 2.1* 2.7* 2.1* 1.4* 1.3*   Cardiac Enzymes: No results for input(s): CKTOTAL, CKMB, CKMBINDEX, TROPONINI in the last 168 hours. BNP (last 3 results) Recent Labs    12/19/19 1546 02/09/20 0825  PROBNP 1,148* 1,185*   HbA1C: No results for input(s): HGBA1C in the last 72 hours. CBG: Recent Labs  Lab 03/03/20 0813 03/03/20 1157 03/03/20 1554 03/03/20 2008 03/04/20 0514  GLUCAP 129* 211* 201* 211* 127*   Lipid Profile: No results for input(s): CHOL, HDL, LDLCALC, TRIG, CHOLHDL, LDLDIRECT in the last 72  hours. Thyroid Function Tests: No results for input(s): TSH, T4TOTAL, FREET4, T3FREE, THYROIDAB in the last 72 hours. Anemia Panel: No results for input(s): VITAMINB12, FOLATE, FERRITIN, TIBC, IRON, RETICCTPCT in the last 72 hours. Sepsis Labs: No results for input(s): PROCALCITON, LATICACIDVEN in the last 168 hours.  Recent Results (from the past 240 hour(s))  Blood Culture (routine x 2)     Status: Abnormal   Collection Time: 02/23/20  9:52 AM   Specimen: BLOOD  Result Value Ref Range Status   Specimen Description   Final    BLOOD RIGHT ANTECUBITAL Performed at [facility] [address], [place], [place] [address]    Special Requests   Final    BOTTLES DRAWN AEROBIC AND ANAEROBIC Blood Culture adequate volume Performed at [facility] [address] [place], [place] [address]    Culture  Setup Time   Final    GRAM NEGATIVE RODS IN BOTH AEROBIC AND ANAEROBIC BOTTLES CRITICAL RESULT CALLED TO, READ BACK BY AND VERIFIED WITH: Irwin Brakeman 1949 02/24/2020 Mena Goes Performed at [facility], [place] [address], [place], [place] [address]    Culture SERRATIA MARCESCENS (A)  Final   Report Status 02/26/2020 FINAL  Final   Organism ID, Bacteria SERRATIA MARCESCENS  Final      Susceptibility    Serratia marcescens - MIC*    CEFAZOLIN >=64 RESISTANT Resistant     CEFEPIME <=0.12 SENSITIVE Sensitive     CEFTAZIDIME <=1 SENSITIVE Sensitive     CEFTRIAXONE <=0.25 SENSITIVE Sensitive     CIPROFLOXACIN <=0.25 SENSITIVE Sensitive     GENTAMICIN <=1 SENSITIVE Sensitive     TRIMETH/SULFA <=20 SENSITIVE Sensitive     * SERRATIA MARCESCENS  Blood Culture ID Panel (Reflexed)     Status: Abnormal   Collection Time: 02/23/20  9:52 AM  Result Value Ref Range Status   Enterococcus faecalis NOT DETECTED NOT DETECTED Final   Enterococcus Faecium NOT DETECTED NOT DETECTED Final   Listeria monocytogenes NOT DETECTED NOT DETECTED Final   Staphylococcus species NOT DETECTED NOT DETECTED Final   Staphylococcus aureus (BCID) NOT DETECTED NOT DETECTED Final   Staphylococcus epidermidis NOT DETECTED NOT DETECTED Final   Staphylococcus lugdunensis NOT DETECTED NOT DETECTED Final   Streptococcus species NOT DETECTED NOT DETECTED Final   Streptococcus agalactiae NOT DETECTED NOT DETECTED Final   Streptococcus pneumoniae NOT DETECTED NOT DETECTED Final   Streptococcus pyogenes NOT DETECTED NOT DETECTED Final   A.calcoaceticus-baumannii NOT DETECTED NOT DETECTED Final   Bacteroides fragilis NOT DETECTED NOT DETECTED Final   Enterobacterales DETECTED (A) NOT DETECTED Final    Comment: Enterobacterales represent a large order of gram negative bacteria, not a single organism. CRITICAL RESULT CALLED TO, READ BACK BY AND VERIFIED WITH: M. LILLISTON,PHARMD 0257 02/24/2020 T. TYSOR    Enterobacter cloacae complex NOT DETECTED NOT DETECTED Final   Escherichia coli NOT DETECTED NOT DETECTED Final   Klebsiella aerogenes NOT DETECTED NOT DETECTED Final   Klebsiella oxytoca NOT DETECTED NOT DETECTED Final   Klebsiella pneumoniae NOT DETECTED NOT DETECTED Final   Proteus species NOT DETECTED NOT DETECTED Final   Salmonella species NOT DETECTED NOT DETECTED Final   Serratia marcescens DETECTED (A) NOT DETECTED  Final    Comment: CRITICAL RESULT CALLED TO, READ BACK BY AND VERIFIED WITH: M. LILLISTON,PHARMD 0257 02/24/2020 T. TYSOR    Haemophilus influenzae NOT DETECTED NOT DETECTED Final   Neisseria meningitidis NOT DETECTED NOT DETECTED Final   Pseudomonas aeruginosa NOT  DETECTED NOT DETECTED Final   Stenotrophomonas maltophilia NOT DETECTED NOT DETECTED Final   Candida albicans NOT DETECTED NOT DETECTED Final   Candida auris NOT DETECTED NOT DETECTED Final   Candida glabrata NOT DETECTED NOT DETECTED Final   Candida krusei NOT DETECTED NOT DETECTED Final   Candida parapsilosis NOT DETECTED NOT DETECTED Final   Candida tropicalis NOT DETECTED NOT DETECTED Final   Cryptococcus neoformans/gattii NOT DETECTED NOT DETECTED Final   CTX-M ESBL NOT DETECTED NOT DETECTED Final   Carbapenem resistance IMP NOT DETECTED NOT DETECTED Final   Carbapenem resistance KPC NOT DETECTED NOT DETECTED Final   Carbapenem resistance NDM NOT DETECTED NOT DETECTED Final   Carbapenem resist OXA 48 LIKE NOT DETECTED NOT DETECTED Final   Carbapenem resistance VIM NOT DETECTED NOT DETECTED Final    Comment: Performed at [facility], [address], [place], [place] [address]  Respiratory Panel by RT PCR (Flu A&B, Covid) - Nasopharyngeal Swab     Status: None   Collection Time: 02/23/20  9:55 AM   Specimen: Nasopharyngeal Swab  Result Value Ref Range Status   SARS Coronavirus 2 by RT PCR NEGATIVE NEGATIVE Final    Comment: (NOTE) SARS-CoV-2 target nucleic acids are NOT DETECTED.  The SARS-CoV-2 RNA is generally detectable in upper respiratoy specimens during the acute phase of infection. The lowest concentration of SARS-CoV-2 viral copies this assay can detect is 131 copies/mL. A negative result does not preclude SARS-Cov-2 infection and should not be used as the sole basis for treatment or other patient management decisions. A negative result may occur with  improper specimen collection/handling, submission  of specimen other than nasopharyngeal swab, presence of viral mutation(s) within the areas targeted by this assay, and inadequate number of viral copies (<131 copies/mL). A negative result must be combined with clinical observations, patient history, and epidemiological information. The expected result is Negative.  Fact Sheet for Patients:  PinkCheek.be  Fact Sheet for Healthcare Providers:  GravelBags.it  This test is no t yet approved or cleared by the [place] FDA and  has been authorized for detection and/or diagnosis of SARS-CoV-2 by FDA under an Emergency Use Authorization (EUA). This EUA will remain  in effect (meaning this test can be used) for the duration of the COVID-19 declaration under Section 564(b)(1) of the Act, 21 U.S.C. section 360bbb-3(b)(1), unless the authorization is terminated or revoked sooner.     Influenza A by PCR NEGATIVE NEGATIVE Final   Influenza B by PCR NEGATIVE NEGATIVE Final    Comment: (NOTE) The Xpert Xpress SARS-CoV-2/FLU/RSV assay is intended as an aid in  the diagnosis of influenza from Nasopharyngeal swab specimens and  should not be used as a sole basis for treatment. Nasal washings and  aspirates are unacceptable for Xpert Xpress SARS-CoV-2/FLU/RSV  testing.  Fact Sheet for Patients: PinkCheek.be  Fact Sheet for Healthcare Providers: GravelBags.it  This test is not yet approved or cleared by the [place] FDA and  has been authorized for detection and/or diagnosis of SARS-CoV-2 by  FDA under an Emergency Use Authorization (EUA). This EUA will remain  in effect (meaning this test can be used) for the duration of the  Covid-19 declaration under Section 564(b)(1) of the Act, 21  U.S.C. section 360bbb-3(b)(1), unless the authorization is  terminated or revoked. Performed at [facility], [place]  [address], [place], [place] [address]   Blood Culture (routine x 2)     Status: None   Collection Time:  02/23/20  9:57 AM   Specimen: BLOOD RIGHT FOREARM  Result Value Ref Range Status   Specimen Description   Final    BLOOD RIGHT FOREARM Performed at [facility], [place] [address] [place], [place] [address]    Special Requests   Final    BOTTLES DRAWN AEROBIC AND ANAEROBIC Blood Culture results may not be optimal due to an inadequate volume of blood received in culture bottles Performed at [facility] [address] [place], [place] [address]    Culture   Final    NO GROWTH 5 DAYS Performed at [facility], [place] [address], [place], [place] [address]    Report Status 02/28/2020 FINAL  Final  Urine culture     Status: Abnormal   Collection Time: 02/23/20 12:09 PM   Specimen: In/Out Cath Urine  Result Value Ref Range Status   Specimen Description   Final    IN/OUT CATH URINE Performed at [facility] [address] [place], [place] [address]    Special Requests   Final    NONE Performed at [facility], [place] [address] [place], [place] [address]    Culture (A)  Final    <10,000 COLONIES/mL INSIGNIFICANT GROWTH Performed at [facility] [address], [place], [place] [address]    Report Status 02/24/2020 FINAL  Final  MRSA PCR Screening     Status: None   Collection Time: 02/25/20  7:26 AM   Specimen: Nasal Mucosa; Nasopharyngeal  Result Value Ref Range Status   MRSA by PCR NEGATIVE NEGATIVE Final    Comment:        The GeneXpert MRSA Assay (FDA approved for NASAL specimens only), is one component of a comprehensive MRSA colonization surveillance program. It is not intended to diagnose MRSA infection nor to guide or monitor treatment for MRSA infections. Performed at [facility], [place] [address], [place], [place] [address]          Radiology Studies: VAS [place] ABI WITH/WO  TBI  Result Date: 03/03/2020 LOWER EXTREMITY DOPPLER STUDY Indications: Rest pain. High Risk Factors: Hypertension, hyperlipidemia, Diabetes, current smoker.  Limitations: Today's exam was limited due to Patients inability to hold still. .              No toe pressures obtained. Performing Technologist: Griffin Basil RCT RDMS  Examination Guidelines: A complete evaluation includes at minimum, Doppler waveform signals and systolic blood pressure reading at the level of bilateral brachial, anterior tibial, and posterior tibial arteries, when vessel segments are accessible. Bilateral testing is considered an integral part of a complete examination. Photoelectric Plethysmograph (PPG) waveforms and toe systolic pressure readings are included as required and additional duplex testing as needed. Limited examinations for reoccurring indications may be performed as noted.  ABI Findings: +---------+------------------+-----+--------+--------+ Right    Rt Pressure (mmHg)IndexWaveformComment  +---------+------------------+-----+--------+--------+ Brachial 145                                     +---------+------------------+-----+--------+--------+ PTA      72                0.50                  +---------+------------------+-----+--------+--------+ DP       80  0.55                  +---------+------------------+-----+--------+--------+ Great Toe                       Absent           +---------+------------------+-----+--------+--------+ +---------+------------------+-----+--------+----------------------------+ Left     Lt Pressure (mmHg)IndexWaveformComment                      +---------+------------------+-----+--------+----------------------------+ Brachial 145                                                         +---------+------------------+-----+--------+----------------------------+ PTA                             absent  No posterior tibial located.  +---------+------------------+-----+--------+----------------------------+ DP       135               0.93                                      +---------+------------------+-----+--------+----------------------------+ Great Toe                       Absent                               +---------+------------------+-----+--------+----------------------------+ TOES Findings: +----------+---------------+--------+-------+ Right ToesPressure (mmHg)WaveformComment +----------+---------------+--------+-------+ 1st Digit                Absent          +----------+---------------+--------+-------+  +---------+---------------+--------+-------+ Left ToesPressure (mmHg)WaveformComment +---------+---------------+--------+-------+ 1st Digit               Absent          +---------+---------------+--------+-------+   Pateints Blood pressure is obtained in leg. Both upper extremities comprimised.  Summary: Right: Resting right ankle-brachial index indicates moderate right lower extremity arterial disease. Left: Resting left ankle-brachial index indicates mild left lower extremity arterial disease.  *See table(s) above for measurements and observations.     Preliminary         Scheduled Meds: . [MAR Hold] Chlorhexidine Gluconate Cloth  6 each Topical Daily  . [MAR Hold] clopidogrel  75 mg Oral Daily  . [MAR Hold] collagenase   Topical Daily  . [MAR Hold] feeding supplement  237 mL Oral TID BM  . [MAR Hold] fluticasone furoate-vilanterol  1 puff Inhalation Daily  . [MAR Hold] heparin injection (subcutaneous)  5,000 Units Subcutaneous Q8H  . [MAR Hold] insulin aspart  0-15 Units Subcutaneous TID WC  . [MAR Hold] insulin aspart  0-5 Units Subcutaneous QHS  . [MAR Hold] insulin glargine  20 Units Subcutaneous Daily  . [MAR Hold] lipase/protease/amylase  72,000 Units Oral TID WC  . [MAR Hold] multivitamin with minerals  1 tablet Oral Daily  . [MAR Hold] nicotine  7 mg Transdermal Daily   . [MAR Hold] pantoprazole  40 mg Oral Daily   Continuous Infusions: . sodium chloride 20 mL/hr at 03/03/20 2050  . [MAR Hold] ceFEPime (MAXIPIME) IV 2 g (03/04/20 0526)  . lactated ringers 10  mL/hr at 03/04/20 0756     LOS: 10 days    Time spent: 35 mins.More than 50% of that time was spent in counseling and/or coordination of care.      Shelly Coss, MD [facility] P11/07/2019, 8:51 AM

## 2020-03-04 NOTE — Anesthesia Procedure Notes (Signed)
Procedure Name: MAC Date/Time: 03/04/2020 8:10 AM Performed by: Amadeo Garnet, CRNA Pre-anesthesia Checklist: Patient identified, Emergency Drugs available, Suction available and Patient being monitored Patient Re-evaluated:Patient Re-evaluated prior to induction Oxygen Delivery Method: Nasal cannula Preoxygenation: Pre-oxygenation with 100% oxygen Induction Type: IV induction Placement Confirmation: positive ETCO2 Dental Injury: Teeth and Oropharynx as per pre-operative assessment

## 2020-03-04 NOTE — Progress Notes (Signed)
STROKE TEAM PROGRESS NOTE   INTERVAL HISTORY RNs are at bedside.  Patient lying in bed, awake alert, no acute event overnight. Had TEE today showed no PFO, no left sided endocarditis, but PV probably vegetation. Also PICC line related vegetation. ID on board.   Vitals:   03/04/20 0915 03/04/20 1200 03/04/20 1400 03/04/20 1537  BP: (!) 148/114 (!) 150/103 (!) 167/113 (!) 126/97  Pulse: (!) 103 (!) 103 (!) 105 (!) 108  Resp: (!) 36 (!) 22 (!) 26 20  Temp:  97.9 F (36.6 C) 98.1 F (36.7 C) 97.9 F (36.6 C)  TempSrc:  Oral Oral Oral  SpO2: 100%  97% 99%  Weight:      Height:       CBC:  Recent Labs  Lab 03/03/20 0606 03/04/20 0537  WBC 8.0 7.7  NEUTROABS 4.9 4.8  HGB 10.5* 10.2*  HCT 34.0* 32.7*  MCV 78.9* 79.4*  PLT 441* 700*   Basic Metabolic Panel:  Recent Labs  Lab 03/03/20 0606 03/04/20 0537  NA 136 139  K 4.1 3.6  CL 104 104  CO2 25 27  GLUCOSE 144* 126*  BUN 18 15  CREATININE 1.49* 1.28*  CALCIUM 8.5* 8.5*  MG 2.0 1.5*   Lipid Panel:  Recent Labs  Lab 03/01/20 0805  CHOL 116  TRIG 83  HDL 22*  CHOLHDL 5.3  VLDL 17  LDLCALC 77   HgbA1c:  No results for input(s): HGBA1C in the last 168 hours. Urine Drug Screen: No results for input(s): LABOPIA, COCAINSCRNUR, LABBENZ, AMPHETMU, THCU, LABBARB in the last 168 hours.  Alcohol Level No results for input(s): ETH in the last 168 hours.  IMAGING past 24 hours No results found.  TEE 03/04/20 LVEF approximately 40% RV function moderately reduced Trivial AR, PR, MR, TR No PFO by color flow, poor injection of agitated saline for assessment.  Probable pulmonary valve vegetation. There is a mobile heterogeneous echodensity attached to the transvenous catheter, seen in the mid and distal SVC, suggestive of catheter related vegetation or thrombus.   PHYSICAL EXAM   Temp:  [96.6 F (35.9 C)-98.6 F (37 C)] 97.9 F (36.6 C) (11/04 1537) Pulse Rate:  [100-112] 108 (11/04 1537) Resp:  [14-41] 20 (11/04  1537) BP: (106-179)/(37-120) 126/97 (11/04 1537) SpO2:  [93 %-100 %] 99 % (11/04 1537) Weight:  [86.2 kg] 86.2 kg (11/04 0741)  General - Well nourished, well developed, in no apparent distress.  Ophthalmologic - fundi not visualized due to noncooperation.  Cardiovascular - Regular rhythm and rate.  Neuro - awake alert, eyes open. Orientated to self and place, however, not orientated to age or time. Moderate dysarthria, paucity of speech, but no aphasia, able to name 2/4 and repeat in dysarthric voice. Visual field full, no gaze preference, facial symmetrical, tongue midline. PERRL. BUE 3/5 with asterixis, BLE proximal 2/5 and knee flexion 3-/5 and ankle PF 3/5. Left foot unhealing wound with pus. Sensation subjectively symmetrical. B/l FTN slow but grossly intact. Gait not tested.  ASSESSMENT/PLAN Morgan Beasley is a 59 y.o. female with history of prior substance abuse (THC, cocaine),  obesity, poorly controlled diabetes mellitus, CAD, systolic/diastolic CHF, ongoing tobacco abuse, stage II CKD, COPD not on home oxygen, OSA on nightly BiPAP, hypertension, PVD/PAD status post angioplasty and stent placement and  left femoral to below-knee popliteal bypass 09/2019 with nonhealing left foot ulcerwho  presenting with L leg and food pain. CT done for encephalopathy revealed a subacute R occipital infarct.  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 Endocarditis still in the DDx.   CT head R temporoccipital and L occipital cortical hypodensities.    MRI  R occipital infarct w/ paretotemporal extension, chronic L occipital infarct   MRA head and neck bilateral siphon and right PCA stenosis  2D Echo EF 40%. No source of embolus   TEE Thurs 11/4 -> no PFO. Probable pulmonary valve vegetation. There is a mobile heterogeneous echodensity attached to the transvenous catheter, seen in the mid  and distal SVC, suggestive of catheter related vegetation or thrombus.   LDL 77  HgbA1c 15.1  VTE prophylaxis - Heparin 5000 units sq tid   clopidogrel 75 mg daily prior to admission, now on clopidogrel 75 mg daily.  Continue on discharge.   Therapy recommendations:  SNF   Disposition:  pending   Septic shock   d/t serratia bacteremia   d/t BLE PAD w/ chronic LLE non-healing ucler in a DM   BP stable now  TEE showed probably PV vegetation, and catheter related vegetation or thrombous  ID on board  On cefepime started 11/2 for bacteremia  Transaminitis   Chronic pancreatitis d/t ALCOHOL use  elevated LTFs but trending down  Surgery on board - low suspicious for cholecystitis - signed off  INR 3.2->2.7->2.1->1.4 - on VitK   Hold off statin for now  Hypotension Hx of hypertension  Now stable . Long-term BP goal normotensive  Hyperlipidemia  Home meds:  crestor 40 and zetia  LDL 77, goal < 70  No statin d/t elevated LFTs  Consider to resume statin once LFTs normalize  Diabetes type II Uncontrolled  HgbA1c 15.1, goal < 7.0  CBGs  SSI  On Lantus  DB RN following  Tobacco abuse  Current smoker  Smoking cessation counseling provided  Pt is willing to quit  On nicotine patch  Other Stroke Risk Factors  ETOH use, advised to drink no more than 2 drink(s) a day  Hx Substance abuse - UDS / ETOH level not performed, chronic narcotic use.     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 For OP bx  COPD    Possible underlying psychiatric d/o  Macrocytic anemia of chronic dz, Hb 9.9->8.5->8.1->10.5->10.2  Hypokalemia - 4.1->3.6  AKI on CKD stage 3a, Cre 1.87->1.49->1.49->1.28  Hypomagnesemia - 1.5 ->mg sulfate 11/4  Hospital day # 10  Neurology will sign off. Please call with questions. Pt will follow up with stroke clinic NP at [facility] in about 4 weeks. Thanks for the consult.  Rosalin Hawking, MD PhD Stroke Neurology 03/04/2020 7:21 PM   To contact Stroke Continuity provider, please refer to [email or web address] After hours, contact General Neurology

## 2020-03-05 DIAGNOSIS — R6521 Severe sepsis with septic shock: Secondary | ICD-10-CM | POA: Diagnosis not present

## 2020-03-05 DIAGNOSIS — N179 Acute kidney failure, unspecified: Secondary | ICD-10-CM | POA: Diagnosis not present

## 2020-03-05 DIAGNOSIS — L97529 Non-pressure chronic ulcer of other part of left foot with unspecified severity: Secondary | ICD-10-CM | POA: Diagnosis not present

## 2020-03-05 DIAGNOSIS — I5022 Chronic systolic (congestive) heart failure: Secondary | ICD-10-CM | POA: Diagnosis not present

## 2020-03-05 DIAGNOSIS — A419 Sepsis, unspecified organism: Secondary | ICD-10-CM | POA: Diagnosis not present

## 2020-03-05 LAB — COMPREHENSIVE METABOLIC PANEL
ALT: 111 U/L — ABNORMAL HIGH (ref 0–44)
AST: 52 U/L — ABNORMAL HIGH (ref 15–41)
Albumin: 1.8 g/dL — ABNORMAL LOW (ref 3.5–5.0)
Alkaline Phosphatase: 275 U/L — ABNORMAL HIGH (ref 38–126)
Anion gap: 8 (ref 5–15)
BUN: 14 mg/dL (ref 6–20)
CO2: 27 mmol/L (ref 22–32)
Calcium: 8.5 mg/dL — ABNORMAL LOW (ref 8.9–10.3)
Chloride: 104 mmol/L (ref 98–111)
Creatinine, Ser: 1.31 mg/dL — ABNORMAL HIGH (ref 0.44–1.00)
GFR, Estimated: 47 mL/min — ABNORMAL LOW (ref >60)
Glucose, Bld: 182 mg/dL — ABNORMAL HIGH (ref 70–99)
Potassium: 3.8 mmol/L (ref 3.5–5.1)
Sodium: 139 mmol/L (ref 135–145)
Total Bilirubin: 0.5 mg/dL (ref 0.3–1.2)
Total Protein: 6.5 g/dL (ref 6.5–8.1)

## 2020-03-05 LAB — CBC WITH DIFFERENTIAL/PLATELET
Abs Immature Granulocytes: 0.03 10*3/uL (ref 0.00–0.07)
Basophils Absolute: 0 10*3/uL (ref 0.0–0.1)
Basophils Relative: 0 %
Eosinophils Absolute: 0.2 10*3/uL (ref 0.0–0.5)
Eosinophils Relative: 3 %
HCT: 33 % — ABNORMAL LOW (ref 36.0–46.0)
Hemoglobin: 10.1 g/dL — ABNORMAL LOW (ref 12.0–15.0)
Immature Granulocytes: 0 %
Lymphocytes Relative: 21 %
Lymphs Abs: 1.4 10*3/uL (ref 0.7–4.0)
MCH: 24.4 pg — ABNORMAL LOW (ref 26.0–34.0)
MCHC: 30.6 g/dL (ref 30.0–36.0)
MCV: 79.7 fL — ABNORMAL LOW (ref 80.0–100.0)
Monocytes Absolute: 0.7 10*3/uL (ref 0.1–1.0)
Monocytes Relative: 11 %
Neutro Abs: 4.4 10*3/uL (ref 1.7–7.7)
Neutrophils Relative %: 65 %
Platelets: 384 10*3/uL (ref 150–400)
RBC: 4.14 MIL/uL (ref 3.87–5.11)
RDW: 19.1 % — ABNORMAL HIGH (ref 11.5–15.5)
WBC: 6.7 10*3/uL (ref 4.0–10.5)
nRBC: 1.9 % — ABNORMAL HIGH (ref 0.0–0.2)

## 2020-03-05 LAB — RESPIRATORY PANEL BY RT PCR (FLU A&B, COVID)
Influenza A by PCR: NEGATIVE
Influenza B by PCR: NEGATIVE
SARS Coronavirus 2 by RT PCR: NEGATIVE

## 2020-03-05 LAB — GLUCOSE, CAPILLARY
Glucose-Capillary: 123 mg/dL — ABNORMAL HIGH (ref 70–99)
Glucose-Capillary: 204 mg/dL — ABNORMAL HIGH (ref 70–99)
Glucose-Capillary: 181 mg/dL — ABNORMAL HIGH (ref 70–99)
Glucose-Capillary: 153 mg/dL — ABNORMAL HIGH (ref 70–99)

## 2020-03-05 LAB — MAGNESIUM: Magnesium: 1.8 mg/dL (ref 1.7–2.4)

## 2020-03-05 MED ORDER — ALBUTEROL SULFATE HFA 108 (90 BASE) MCG/ACT IN AERS
2.0000 | INHALATION_SPRAY | Freq: Four times a day (QID) | RESPIRATORY_TRACT | Status: DC | PRN
  Administered 2020-03-06 – 2020-03-11 (×6): 2 via RESPIRATORY_TRACT
  Filled 2020-03-05: qty 6.7

## 2020-03-05 MED ORDER — GABAPENTIN 300 MG PO CAPS
300.0000 mg | ORAL_CAPSULE | Freq: Three times a day (TID) | ORAL | Status: DC
  Administered 2020-03-05: 300 mg via ORAL
  Filled 2020-03-05: qty 1

## 2020-03-05 NOTE — Progress Notes (Signed)
Physical Therapy Treatment Patient Details Name: Morgan Beasley MRN: [id number] DOB: 11-07-1960 Today's Date: 03/05/2020    History of Present Illness Pt is a 59 y.o. female admitted 02/23/20 with LLE pain; lab work consistent with sepsis. Also with LLE cellulitis. 30 /31 pt with acute hallucinations and AMS found to have Rt occipital CVA with parietotemporal extension and chronic Left occipital CVAPMH includes CHF, CKD III, COPD, DM, neuropathy, HTN, CAD, OSA, PAD, tobacco use, alcohol dependence, anxiety, gout, breast CA, R foot ulcer, LLE vascular intervention in 2021 (most recently 01/26/20).    PT Comments    Pt with significantly improved command following, mobility and balance this session. Pt remains with limited endurance and cognition as D/c plan still appropriate. Pt maintained spO2 94-100% on  RA throughout with drop to 87% at end of session with return to bed and pt returned to 1L [place] at 99%. Pt encouraged to be up to [facility] with nursing staff and to chair daily.    Follow Up Recommendations  SNF;Supervision/Assistance - 24 hour     Equipment Recommendations  3in1 (PT)    Recommendations for Other Services       Precautions / Restrictions Precautions Precautions: Fall Precaution Comments: left foot wound Restrictions Weight Bearing Restrictions: No    Mobility  Bed Mobility Overal bed mobility: Needs Assistance Bed Mobility: Supine to Sit     Supine to sit: Min assist;HOB elevated     General bed mobility comments: pt sitting in chair on arrival and end of session  Transfers Overall transfer level: Needs assistance Equipment used: 4-wheeled walker Transfers: Sit to/from Stand Sit to Stand: Min guard Stand pivot transfers: Min guard       General transfer comment: minguard to stand from recliner x 5, bSC x 1,  rollator x 1 with cues for safety with hand placement and brake use  Ambulation/Gait Ambulation/Gait assistance: Min assist Gait Distance  (Feet): 15 Feet Assistive device: 4-wheeled walker Gait Pattern/deviations: Step-through pattern;Decreased stride length;Trunk flexed   Gait velocity interpretation: 1.31 - 2.62 ft/sec, indicative of limited community ambulator General Gait Details: cues for posture with pt able to walk 15' x 2 trials with rollator, cues for pursed lip breathing and to decrease anxiety   Stairs             Wheelchair Mobility    Modified Rankin (Stroke Patients Only)       Balance Overall balance assessment: Needs assistance Sitting-balance support: No upper extremity supported;Feet supported Sitting balance-Leahy Scale: Fair Sitting balance - Comments: pt able to sit without assist   Standing balance support: Single extremity supported;Bilateral upper extremity supported;During functional activity Standing balance-Leahy Scale: Poor Standing balance comment: reliant on UE support in standing                            Cognition Arousal/Alertness: Awake/alert Behavior During Therapy: Anxious Overall Cognitive Status: Impaired/Different from baseline Area of Impairment: Orientation;Following commands;Problem solving;Awareness;Memory;Attention;Safety/judgement                 Orientation Level: Disoriented to;Time;Situation Current Attention Level: Sustained Memory: Decreased short-term memory Following Commands: Follows one step commands consistently;Follows one step commands with increased time Safety/Judgement: Decreased awareness of safety;Decreased awareness of deficits Awareness: Intellectual Problem Solving: Slow processing;Decreased initiation;Difficulty sequencing;Requires verbal cues;Requires tactile cues General Comments: pt anxious at times needing cues for breathing, pt unaware of urinary incontinence, decreased problem solving      Exercises General Exercises -  Lower Extremity Ankle Circles/Pumps: AROM;Both;10 reps;Seated;Supine Long Arc Quad: AROM;10  reps;Both;Seated Other Exercises Other Exercises: seated marching from recliner BLEs x10 reps    General Comments General comments (skin integrity, edema, etc.): entered room with pt on 3L O2 with O2 100%. doffed O2 during mobility tasks however noted effortful breathing during mobility even though SpO2 100%. pt did report dizziness once sitting EOB but feeling subsided with time      Pertinent Vitals/Pain Pain Assessment: No/denies pain Pain Score: 3  Pain Location: legs throbbing Pain Descriptors / Indicators: Aching;Throbbing Pain Intervention(s): Limited activity within patient's tolerance;Repositioned    Home Living                      Prior Function            PT Goals (current goals can now be found in the care plan section) Acute Rehab PT Goals Patient Stated Goal: none stated Progress towards PT goals: Progressing toward goals    Frequency    Min 3X/week      PT Plan Current plan remains appropriate    Co-evaluation              AM-PAC PT "6 Clicks" Mobility   Outcome Measure  Help needed turning from your back to your side while in a flat bed without using bedrails?: A Little Help needed moving from lying on your back to sitting on the side of a flat bed without using bedrails?: A Lot Help needed moving to and from a bed to a chair (including a wheelchair)?: A Little Help needed standing up from a chair using your arms (e.g., wheelchair or bedside chair)?: A Little Help needed to walk in hospital room?: A Little Help needed climbing 3-5 steps with a railing? : A Lot 6 Click Score: 16    End of Session Equipment Utilized During Treatment: Gait belt Activity Tolerance: Patient tolerated treatment well Patient left: in bed;with call bell/phone within reach;with bed alarm set Nurse Communication: Mobility status PT Visit Diagnosis: Other abnormalities of gait and mobility (R26.89);Pain;Muscle weakness (generalized) (M62.81)     Time:  5784-6962 PT Time Calculation (min) (ACUTE ONLY): 31 min  Charges:  $Gait Training: 8-22 mins $Therapeutic Activity: 8-22 mins                     Fleurette Woolbright P, PT Acute Rehabilitation Services Pager: [phone] Office: [place] 03/05/2020, 2:01 PM

## 2020-03-05 NOTE — Progress Notes (Signed)
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 She was found to have sepsis secondary to Serratia bacteremia from left lower extremity cellulitis with complicated chronic nonhealing ulcer. Hospital course remarkable for persistent lactic acidosis concerning for critical lower extremity vascular ischemia. Vascular surgery consulted and she was transferred to [facility]. During this hospitalization, she developed acute nonhemorrhagic stroke, also developed AKI and shock liver due to hypotension. Neurology following for stroke.Underwnt TEE on 11.4.21. Hospital course remarkable for persistent encephalopathy but now the mental status has improved.  TEE showed pulmonary valve vegetation.  Plan to continue cefepime till 03/21/2020.  She is medically stable for discharge to skilled nursing facility as soon as bed is available.   Assessment & Plan:   Principal Problem:   Septic shock ([place]) Active Problems:   Uncontrolled diabetes mellitus with circulatory complication, with long-term current use of insulin (HCC)   COPD (chronic obstructive pulmonary disease) (HCC)   PVD (peripheral vascular disease) (HCC)   Ischemia of left lower extremity   Chronic systolic CHF (congestive heart failure) (HCC)   Obesity  (BMI 30-39.9)   Cerebral embolism with cerebral infarction   Septic shock/gram-negative bacteremia: In the setting of  bilateral lower extremity peripheral artery disease with chronic left lower extremity nonhealing ulcer diabetic patient. Hospital course remarkable for shock, AKI. She was treated with IV fluids. Sepsis physiology has improved. Continue current antibiotics and wound care.ID also consulted for finding of possible pulmonary valve vegetation.  Planning to continue cefepime through 03/21/2020.  Acute nonhemorrhagic stroke: Hospital course remarkable for encephalopathy secondary to acute right occipital infarct. She developed mild left arm weakness. Neurology following. Stroke work-up completeds. She has multiple risk factors including uncontrolled diabetes with hemoglobin A1c of 15, tobacco use, severe peripheral artery disease, elevated LDL. Plan to resume a statin after improvement in the liver function. On Plavix currently. Neurology following. TEE showed ejection fraction of 40%, no PFO or  cardiac source of emboli but probable pulmonary valve vegetation.  Also showed catheter thrombus. PT/OT evaluation done and recommended skilled nursing facility.  Possible pulmonary valve vegetation/catheter related vegetation versus thrombus: Uncommon scenario of finding of possible pulmonary vegetation in the setting of gram-negative bacteremia. ID was following . No need to start anticoagulation for catheter related thrombus. No need to remove central line.  Continue cefepime till 03/21/2020.  Acute encephalopathy: Patient is intermittently agitated and confused but mental status has  improved since last few days. This could be most likely associated with the stroke. Continue to monitor mental status.  Peripheral artery disease/nonhealing lower extremity ulcer: Vascular surgery following. She is status post left femoropopliteal bypass on 10/19/2019. On Plavix and statin at home.  Vascular surgery  ordered ABI/lower extremity arterial duplex  Chronic combined systolic/diastolic congestive heart failure: Echo done few months ago showed ejection fraction of 40 to 40% with global hypokinesis. Elevated BNP. She received fluids  during this hospitalization for septic shock.  She looks volume overloaded.  She takes 40 mg of Lasix at home daily. She was given  a dose of IV Lasix 60 mg once ,started lasix 40 mg daily. Will monitor input and output.  Elevated liver enzymes: Most likely from shock liver from hypotension. Right upper quadrant ultrasound showed nonspecific changes. She was seen by general surgery, no plan for intervention. Acute hepatitis panel negative. Right upper quadrant liver Doppler stable. Questionable early NASH on right upper quadrant ultrasound.Liver enzymes improving  Electrolyte abnormalities: Continue to monitor and supplement.  Diabetes type 2/uncontrolled hyperglycemia: Hemoglobin A1c of 15. Continue current insulin regimen. Monitor blood sugars.  Anemia of chronic disease: Currently hemoglobin is stable. Continue to monitor.  AKI on CKD stage IIIa: Baseline creatinine 1.4. Hospital course remarkable for AKI secondary to contrast nephropathy, dehydration, hypotension. Currently kidney function at baseline.  Hyperlipidemia: On statin and Zetia. Will resume statin when liver function improves.  COPD/tobacco abuse: Counseled for smoking cessation. Currently stable in terms of COPD.  Obesity with OSA: BMI of 32. Uses BiPAP at night at home.  Hypertension: Hospital course remarkable for hypotension. Beta-blockers on hold. Monitor blood pressure.  Reactive mediastinal/hilar/right supraclavicular adenopathy: Incidental finding during this hospitalization. Outpatient biopsy recommended  Debility/deconditioning: PT/OT recommended SN facility on discharge..      Nutrition Problem: Increased nutrient needs Etiology: wound healing      DVT prophylaxis:Heparin [place] Code  Status: Full Family Communication: Discussed with daughter at bedside on 03/05/2020. Status is: Inpatient  Remains inpatient appropriate because:Inpatient level of care appropriate due to severity of illness   Dispo: The patient is from: Home              Anticipated d/c is to: SNF               Anticipated d/c date is:As soon as bed available              Patient currently is medically stable for discharge     Consultants: Vascular surgery, Neurology   Procedures:  Antimicrobials:  Anti-infectives (From admission, onward)   Start     Dose/Rate Route Frequency Ordered Stop   03/02/20 1800  ceFEPIme (MAXIPIME) 2 g in sodium chloride 0.9 % 100 mL IVPB        2 g 200 mL/hr over 30 Minutes Intravenous Every 12 hours 03/02/20 0853     02/28/20 0546  ceFEPIme (MAXIPIME) 2 g in sodium chloride 0.9 % 100 mL IVPB  Status:  Discontinued        2 g 200 mL/hr over 30 Minutes Intravenous Every 24 hours 02/27/20 1041 03/02/20 0853   02/25/20 1400  metroNIDAZOLE (FLAGYL) tablet 500 mg  Status:  Discontinued        500 mg Oral Every 8 hours 02/25/20 1025 02/29/20 0911   02/24/20 1800  ceFEPIme (MAXIPIME) 2 g in sodium chloride 0.9 % 100 mL IVPB  Status:  Discontinued        2 g 200 mL/hr over 30 Minutes Intravenous Every 12 hours 02/24/20 0732 02/27/20 1041   02/24/20 0600  ceFEPIme (MAXIPIME) 2 g in sodium chloride 0.9 % 100 mL IVPB  Status:  Discontinued        2 g 200 mL/hr over 30 Minutes Intravenous Every 8 hours 02/24/20 0440 02/24/20 0732   02/23/20 1015  vancomycin (VANCOREADY) IVPB 1750 mg/350 mL        1,750 mg 175 mL/hr over 120 Minutes Intravenous  Once 02/23/20 1007 02/23/20 1347   02/23/20 1000  cefTRIAXone (ROCEPHIN) 2 g in sodium chloride 0.9 % 100 mL IVPB  Status:  Discontinued        2 g 200 mL/hr over 30 Minutes Intravenous Every 24 hours 02/23/20 0952 02/24/20 0440   02/23/20 1000  metroNIDAZOLE (FLAGYL) IVPB 500 mg  Status:  Discontinued        500 mg 100 mL/hr over 60  Minutes Intravenous Every 8 hours 02/23/20 0952 02/25/20 1025      Subjective:  Patient seen and examined at the bedside this morning.  Appears much better today.  Alert and oriented.  Daughter at bedside.  Looks very comfortable  Objective: Vitals:   03/04/20 1916 03/04/20 2300 03/05/20 0304 03/05/20 0746  BP: 133/88 110/82 132/90 131/88  Pulse: 91 79 86 92  Resp: 18 20 17  (!) 25  Temp: 98 F (36.7 C) 98.3 F (36.8 C) 97.6 F (36.4 C) 97.8 F (36.6 C)  TempSrc: Oral Oral Oral Oral  SpO2: 99% 98% 99% 98%  Weight:      Height:        Intake/Output Summary (Last 24 hours) at 03/05/2020 0758 Last data filed at 03/05/2020 0510 Gross per 24 hour  Intake 373.71 ml  Output 1400 ml  Net -1026.29 ml   Filed Weights   02/23/20 0937 03/04/20 0741  Weight: 90.3 kg 86.2 kg    Examination:   General exam: Comfortable, obese HEENT:PERRL,Oral mucosa moist, Ear/Nose normal on gross exam Respiratory system: Bilateral equal air entry, normal vesicular breath sounds, no wheezes or crackles  Cardiovascular system: S1 & S2 heard, RRR. No JVD, murmurs, rubs, gallops or clicks. Gastrointestinal system: Abdomen is nondistended, soft and nontender. No organomegaly or masses felt. Normal bowel sounds heard. Central nervous system: Alert and oriented.  Mild weakness in the left upper extremity Extremities: Trace lower extremity edema, no clubbing ,no cyanosis, mild edema of the left upper extremity.  PICC line on the left arm skin: Ulcer on the dorsum of the left foot,no icterus ,no pallor    Data Reviewed: I have personally reviewed following labs and imaging studies  CBC: Recent Labs  Lab 02/29/20 0455 03/01/20 0500 03/02/20 0431 03/03/20 0606 03/04/20 0537  WBC 7.7 6.8 5.8 8.0 7.7  NEUTROABS 5.7 4.6 3.7 4.9 4.8  HGB 9.9* 8.5* 8.1* 10.5* 10.2*  HCT 32.2* 28.2* 26.4* 34.0* 32.7*  MCV 81.5 81.0 81.5 78.9* 79.4*  PLT 378 350 337 441* 235*   Basic Metabolic Panel: Recent Labs   Lab 02/29/20 0455 03/01/20 0500 03/02/20 0431 03/03/20 0606 03/04/20 0537  NA 134* 135 135 136 139  K 4.7 3.3* 3.3* 4.1 3.6  CL 104 120* 124* 104 104  CO2 23 20* 18* 25 27  GLUCOSE 80 64* 104* 144* 126*  BUN 39* 27* 20 18 15   CREATININE 2.73* 1.87* 1.49* 1.49* 1.28*  CALCIUM 7.6* 6.4* 6.2* 8.5* 8.5*  MG 2.3 1.6* 1.4* 2.0 1.5*   GFR: Estimated Creatinine Clearance: 53 mL/min (A) (by C-G formula based on SCr of 1.28 mg/dL (H)). Liver Function Tests: Recent Labs  Lab 02/29/20 0455 03/01/20 0500 03/02/20 0431 03/03/20 0606 03/04/20 0537  AST 758* 266* 121* 91* 65*  ALT 341* 213* 152* 176* 138*  ALKPHOS 290* 214* 249* 332* 292*  BILITOT 0.6 0.6 0.6 0.5 0.7  PROT 4.9* 5.4* 5.5* 6.2* 6.3*  ALBUMIN 1.4* 1.2* 1.1* 1.7* 1.7*   No results for input(s): LIPASE, AMYLASE in the last 168 hours.  No results for input(s): AMMONIA in the last 168 hours. Coagulation Profile: Recent Labs  Lab 02/29/20 0455 03/01/20 0500 03/02/20 0431 03/03/20 0606 03/04/20 0537  INR 2.1* 2.7* 2.1* 1.4* 1.3*   Cardiac Enzymes: No results for input(s): CKTOTAL, CKMB, CKMBINDEX, TROPONINI in the last 168 hours. BNP (last 3 results) Recent Labs    12/19/19 1546 02/09/20 0825  PROBNP 1,148* 1,185*   HbA1C: No results for input(s): HGBA1C in the last 72 hours. CBG: Recent Labs  Lab 03/04/20 0514 03/04/20 1156 03/04/20 1625 03/04/20 2053 03/05/20 0726  GLUCAP 127* 101* 180* 151* 123*   Lipid Profile: No results for input(s): CHOL, HDL, LDLCALC, TRIG, CHOLHDL, LDLDIRECT in the last 72 hours. Thyroid Function Tests: No results for input(s): TSH, T4TOTAL, FREET4, T3FREE, THYROIDAB in the last 72 hours. Anemia Panel: No results for input(s): VITAMINB12, FOLATE, FERRITIN, TIBC, IRON, RETICCTPCT in the last 72 hours. Sepsis Labs: No results for input(s): PROCALCITON, LATICACIDVEN in the last 168 hours.  Recent Results (from the past 240 hour(s))  MRSA PCR Screening     Status: None    Collection Time: 02/25/20  7:26 AM   Specimen: Nasal Mucosa; Nasopharyngeal  Result Value Ref Range Status   MRSA by PCR NEGATIVE NEGATIVE Final    Comment:        The GeneXpert MRSA Assay (FDA approved for NASAL specimens only), is one component of a comprehensive MRSA colonization surveillance program. It is not intended to diagnose MRSA infection nor to guide or monitor treatment for MRSA infections. Performed at [facility], [place] [address], [place], [place] [address]          Radiology Studies: VAS [place] ABI WITH/WO TBI  Result Date: 03/04/2020 LOWER EXTREMITY DOPPLER STUDY Indications: Rest pain. High Risk Factors: Hypertension, hyperlipidemia, Diabetes, current smoker.  Limitations: Today's exam was limited due to Patients inability to hold still. .              No toe pressures obtained. Performing Technologist: Griffin Basil RCT RDMS  Examination Guidelines: A complete evaluation includes at minimum, Doppler waveform signals and systolic blood pressure reading at the level of bilateral brachial, anterior tibial, and posterior tibial arteries, when vessel segments are accessible. Bilateral testing is considered an integral part of a complete examination. Photoelectric Plethysmograph (PPG) waveforms and toe systolic pressure readings are included as required and additional duplex testing as needed. Limited examinations for reoccurring indications may be performed as noted.  ABI Findings: +---------+------------------+-----+--------+--------+ Right    Rt Pressure (mmHg)IndexWaveformComment  +---------+------------------+-----+--------+--------+ Brachial 145                                     +---------+------------------+-----+--------+--------+ PTA      72                0.50                  +---------+------------------+-----+--------+--------+ DP       80                0.55                  +---------+------------------+-----+--------+--------+ Great  Toe                       Absent           +---------+------------------+-----+--------+--------+ +---------+------------------+-----+--------+----------------------------+ Left  Lt Pressure (mmHg)IndexWaveformComment                      +---------+------------------+-----+--------+----------------------------+ Brachial 145                                                         +---------+------------------+-----+--------+----------------------------+ PTA                             absent  No posterior tibial located. +---------+------------------+-----+--------+----------------------------+ DP       135               0.93                                      +---------+------------------+-----+--------+----------------------------+ Great Toe                       Absent                               +---------+------------------+-----+--------+----------------------------+ TOES Findings: +----------+---------------+--------+-------+ Right ToesPressure (mmHg)WaveformComment +----------+---------------+--------+-------+ 1st Digit                Absent          +----------+---------------+--------+-------+  +---------+---------------+--------+-------+ Left ToesPressure (mmHg)WaveformComment +---------+---------------+--------+-------+ 1st Digit               Absent          +---------+---------------+--------+-------+   Pateints Blood pressure is obtained in leg. Both upper extremities comprimised.  Summary: Right: Resting right ankle-brachial index indicates moderate right lower extremity arterial disease. Left: Resting left ankle-brachial index indicates mild left lower extremity arterial disease.  *See table(s) above for measurements and observations.  Electronically signed by Jamelle Haring on 03/04/2020 at 7:16:47 PM.    Final    ECHO TEE  Result Date: 03/04/2020    TRANSESOPHOGEAL ECHO REPORT   Patient Name:   MARLENA BARBATO [facility] Date of Exam:  03/04/2020 Medical Rec #:  [id number]            Height:       66.0 in Accession #:    [id number]           Weight:       190.0 lb Date of Birth:  06-Apr-1961             BSA:          1.957 m Patient Age:    24 years             BP:           73/54 mmHg Patient Gender: F                    HR:           117 bpm. Exam Location:  Inpatient Procedure: Transesophageal Echo, Cardiac Doppler, Color Doppler and Saline            Contrast Bubble Study Indications:     Stroke 434.91 / I163.9  History:         Patient has prior history of Echocardiogram examinations, most  recent 03/01/2020. CHF, COPD; Risk Factors:Hypertension,                  Diabetes and Dyslipidemia.  Sonographer:     Jonelle Sidle Dance Referring Phys:  [id number] Darreld Mclean Diagnosing Phys: Cherlynn Kaiser MD PROCEDURE: After discussion of the risks and benefits of a TEE, an informed consent was obtained from the patient. TEE procedure time was 25 minutes. The transesophogeal probe was passed without difficulty through the esophogus of the patient. Imaged were obtained with the patient in a supine position. Local oropharyngeal anesthetic was provided with Cetacaine. Sedation performed by different physician. The patient was monitored while under deep sedation. Anesthestetic sedation was provided intravenously by Anesthesiology: 367.56mg  of Propofol. Image quality was good. The patient's vital signs; including heart rate, blood pressure, and oxygen saturation; remained stable throughout the procedure. The patient developed Atrial Fibrillation during the procedure. Imaging probe error with gain control is noted on images throughout study. IMPRESSIONS  1. There is a mobile heterogeneous echodensity attached to the transvenous catheter, seen in the mid and distal SVC, suggestive of catheter related vegetation or thrombus.  2. Left ventricular ejection fraction, by estimation, is 40%. The left ventricle has mildly decreased function.  3. Right  ventricular systolic function is moderately reduced. The right ventricular size is moderately enlarged.  4. Left atrial size was mildly dilated. No left atrial/left atrial appendage thrombus was detected. The LAA emptying velocity was 57 cm/s.  5. Right atrial size was moderately dilated.  6. The mitral valve is grossly normal. Trivial mitral valve regurgitation.  7. The aortic valve is tricuspid. Aortic valve regurgitation is trivial. No aortic stenosis is present.  8. Probable small pulmonary valve vegetation. Mobile echodensity noted on the PA aspect of the pulmonary valve.. The pulmonic valve was abnormal.  9. Agitated saline contrast bubble study was negative, with no evidence of any interatrial shunt. FINDINGS  Left Ventricle: Left ventricular ejection fraction, by estimation, is 40%. The left ventricle has mildly decreased function. The left ventricular internal cavity size was normal in size. Right Ventricle: The right ventricular size is moderately enlarged. No increase in right ventricular wall thickness. Right ventricular systolic function is moderately reduced. Left Atrium: Left atrial size was mildly dilated. No left atrial/left atrial appendage thrombus was detected. The LAA emptying velocity was 57 cm/s. Right Atrium: Right atrial size was moderately dilated. Pericardium: There is no evidence of pericardial effusion. Mitral Valve: The mitral valve is grossly normal. Trivial mitral valve regurgitation. Tricuspid Valve: The tricuspid valve is normal in structure. Tricuspid valve regurgitation is trivial. Aortic Valve: The aortic valve is tricuspid. Aortic valve regurgitation is trivial. No aortic stenosis is present. Pulmonic Valve: Probable small pulmonary valve vegetation. Mobile echodensity noted on the PA aspect of the pulmonary valve. The pulmonic valve was abnormal. Pulmonic valve regurgitation is trivial. Aorta: The aortic root and ascending aorta are structurally normal, with no evidence of  dilitation. IAS/Shunts: There is left bowing of the interatrial septum, suggestive of elevated right atrial pressure. No atrial level shunt detected by color flow Doppler. Agitated saline contrast was given intravenously to evaluate for intracardiac shunting. Agitated saline contrast bubble study was negative, with no evidence of any interatrial shunt. Additional Comments: There is a small pleural effusion. Cherlynn Kaiser MD Electronically signed by Cherlynn Kaiser MD Signature Date/Time: 03/04/2020/11:29:17 PM    Final         Scheduled Meds: . Chlorhexidine Gluconate Cloth  6 each Topical Daily  . clopidogrel  75 mg Oral Daily  . collagenase   Topical Daily  . feeding supplement  237 mL Oral TID BM  . fluticasone furoate-vilanterol  1 puff Inhalation Daily  . furosemide  40 mg Oral Daily  . heparin injection (subcutaneous)  5,000 Units Subcutaneous Q8H  . insulin aspart  0-15 Units Subcutaneous TID WC  . insulin aspart  0-5 Units Subcutaneous QHS  . insulin glargine  20 Units Subcutaneous Daily  . lipase/protease/amylase  72,000 Units Oral TID WC  . metoprolol succinate  50 mg Oral Daily  . multivitamin with minerals  1 tablet Oral Daily  . nicotine  7 mg Transdermal Daily  . pantoprazole  40 mg Oral Daily   Continuous Infusions: . ceFEPime (MAXIPIME) IV 2 g (03/05/20 0513)  . lactated ringers 10 mL/hr at 03/04/20 0756     LOS: 11 days    Time spent: 35 mins.More than 50% of that time was spent in counseling and/or coordination of care.      Shelly Coss, MD [facility] P11/08/2019, 7:58 AM

## 2020-03-05 NOTE — Progress Notes (Signed)
Pharmacist Heart Failure Core Measure Documentation  Assessment: Morgan Beasley has an EF documented as 40% on 03/04/20 by Cherlynn Kaiser.  Rationale: Heart failure patients with left ventricular systolic dysfunction (LVSD) and an EF < 40% should be prescribed an angiotensin converting enzyme inhibitor (ACEI) or angiotensin receptor blocker (ARB) at discharge unless a contraindication is documented in the medical record.  This patient is not currently on an ACEI or ARB for HF.  This note is being placed in the record in order to provide documentation that a contraindication to the use of these agents is present for this encounter.  ACE Inhibitor or Angiotensin Receptor Blocker is contraindicated (specify all that apply)  []   ACEI allergy AND ARB allergy []   Angioedema []   Moderate or severe aortic stenosis []   Hyperkalemia []   Hypotension []   Renal artery stenosis [x]   Worsening renal function, preexisting renal disease or dysfunction   Morgan Beasley 03/05/2020 9:53 AM

## 2020-03-05 NOTE — Anesthesia Postprocedure Evaluation (Signed)
Anesthesia Post Note  Patient: Morgan Beasley  Procedure(s) Performed: TRANSESOPHAGEAL ECHOCARDIOGRAM (TEE) (N/A ) BUBBLE STUDY     Patient location during evaluation: Endoscopy Anesthesia Type: MAC Level of consciousness: awake and alert Pain management: pain level controlled Vital Signs Assessment: post-procedure vital signs reviewed and stable Respiratory status: spontaneous breathing, nonlabored ventilation, respiratory function stable and patient connected to nasal cannula oxygen Cardiovascular status: blood pressure returned to baseline and stable Postop Assessment: no apparent nausea or vomiting Anesthetic complications: no   No complications documented.  Last Vitals:  Vitals:   03/05/20 0746 03/05/20 0809  BP: 131/88   Pulse: 92 94  Resp: (!) 25 20  Temp: 36.6 C   SpO2: 98% 100%    Last Pain:  Vitals:   03/05/20 0746  TempSrc: Oral  PainSc:                  [place]

## 2020-03-05 NOTE — Progress Notes (Signed)
Nutrition Follow-up  DOCUMENTATION CODES:   Obesity unspecified  INTERVENTION:   D/c Ensure Enlive, pt dislikes  Snacks TID  Continue MVI daily   NUTRITION DIAGNOSIS:   Increased nutrient needs related to wound healing as evidenced by estimated needs.  ongoing  GOAL:   Patient will meet greater than or equal to 90% of their needs  progressing  MONITOR:   PO intake, Supplement acceptance, Diet advancement, Skin, Labs, I & O's  REASON FOR ASSESSMENT:   Low [place], NPO/Clear Liquid Diet    ASSESSMENT:   Pt admitted with septic shock 2/2  bacteremia from LLE cellulitis and nonhealing foot ulcer. PMH includes DM, CAD, CHF, HTN, PVD/PAD s/p angioplasty and stent placement.  Pt's hospitalization complicated by an acute infarct for which neurology is following. Additionally, pt had AKI and shock liver due to hypotension. Per MD, both getting better after hydration. Pt underwent TEE yesterday.  Pt reports that at baseline, she eats 3 balanced meals per day and does not struggle with her appetite, but has had a poor appetite since being admitted. No PO intake documented. Discussed pt with RN who reports pt's intake has been varied; pt has eaten 0-75% of the meals observed by today's RN. Pt with orders for Ensure Enlive TID, but states she does not like these supplements. Discussed ordering snacks with pt and she is agreeable to plan.   UOP: 1431ml x24 hours  Medications: lasix, ss novolog, 20 units lantus daily, creon, mvi, protonix Labs: CBGs 123-204   NUTRITION - FOCUSED PHYSICAL EXAM:    Most Recent Value  Orbital Region No depletion  Upper Arm Region No depletion  Thoracic and Lumbar Region No depletion  Buccal Region No depletion  Temple Region No depletion  Clavicle Bone Region No depletion  Clavicle and Acromion Bone Region No depletion  Scapular Bone Region No depletion  Dorsal Hand No depletion  Patellar Region No depletion  Anterior Thigh Region No  depletion  Posterior Calf Region No depletion  Edema (RD Assessment) Mild  Hair Reviewed  Eyes Reviewed  Mouth Reviewed  Skin Reviewed  Nails Reviewed       Diet Order:   Diet Order            Diet heart healthy/carb modified Room service appropriate? Yes; Fluid consistency: Thin  Diet effective now                 EDUCATION NEEDS:   Education needs have been addressed  Skin:  Skin Assessment: Skin Integrity Issues: Skin Integrity Issues:: Other (Comment) Other: nonhealing L foot wound  Last BM:  11/4  Height:   Ht Readings from Last 1 Encounters:  03/04/20 5\' 6"  (1.676 m)    Weight:   Wt Readings from Last 1 Encounters:  03/04/20 86.2 kg    Ideal Body Weight:  59.09 kg  BMI:  Body mass index is 30.67 kg/m.  Estimated Nutritional Needs:   Kcal:  1950-2150  Protein:  100-120 grams  Fluid:  >/=1.95 L/d    Larkin Ina, MS, RD, LDN RD pager number and weekend/on-call pager number located in [place].

## 2020-03-05 NOTE — Plan of Care (Signed)
  Problem: Education: Goal: Knowledge of General Education information will improve Description: Including pain rating scale, medication(s)/side effects and non-pharmacologic comfort measures Outcome: Progressing   Problem: Health Behavior/Discharge Planning: Goal: Ability to manage health-related needs will improve Outcome: Progressing   Problem: Clinical Measurements: Goal: Ability to maintain clinical measurements within normal limits will improve Outcome: Progressing Goal: Will remain free from infection Outcome: Progressing Goal: Diagnostic test results will improve Outcome: Progressing Goal: Respiratory complications will improve Outcome: Progressing Goal: Cardiovascular complication will be avoided Outcome: Progressing   Problem: Activity: Goal: Risk for activity intolerance will decrease Outcome: Progressing   Problem: Nutrition: Goal: Adequate nutrition will be maintained Outcome: Progressing   Problem: Coping: Goal: Level of anxiety will decrease Outcome: Progressing   Problem: Elimination: Goal: Will not experience complications related to bowel motility Outcome: Progressing   Problem: Pain Managment: Goal: General experience of comfort will improve Outcome: Progressing   Problem: Skin Integrity: Goal: Risk for impaired skin integrity will decrease Outcome: Progressing   Problem: Education: Goal: Ability to describe self-care measures that may prevent or decrease complications (Diabetes Survival Skills Education) will improve Outcome: Progressing Goal: Individualized Educational Video(s) Outcome: Progressing   Problem: Coping: Goal: Ability to adjust to condition or change in health will improve Outcome: Progressing   Problem: Fluid Volume: Goal: Ability to maintain a balanced intake and output will improve Outcome: Progressing   Problem: Metabolic: Goal: Ability to maintain appropriate glucose levels will improve Outcome: Progressing    Problem: Nutritional: Goal: Maintenance of adequate nutrition will improve Outcome: Progressing Goal: Progress toward achieving an optimal weight will improve Outcome: Progressing   Problem: Skin Integrity: Goal: Risk for impaired skin integrity will decrease Outcome: Progressing   Problem: Tissue Perfusion: Goal: Adequacy of tissue perfusion will improve Outcome: Progressing   Problem: Education: Goal: Knowledge of secondary prevention will improve Outcome: Progressing Goal: Knowledge of patient specific risk factors addressed and post discharge goals established will improve Outcome: Progressing Goal: Individualized Educational Video(s) Outcome: Progressing

## 2020-03-05 NOTE — Progress Notes (Signed)
Unable to obtain labs via picc, phlebotomist attemted x1 and was  Unsuccessful. Consult order was put in for IV team to draw labs via peripheral IV.

## 2020-03-05 NOTE — Progress Notes (Signed)
    [facility] for Infectious Disease   Reason for visit: Follow up on PV vegetation  Interval History: no fever, WBC wnl, no fever.  No new complaints.     Physical Exam: Constitutional:  Vitals:   03/05/20 0746 03/05/20 0809  BP: 131/88   Pulse: 92 94  Resp: (!) 25 20  Temp: 97.8 F (36.6 C)   SpO2: 98% 100%   patient appears in NAD Respiratory: Normal respiratory effort; CTA B Cardiovascular: RRR GI: soft, nt, nd  Review of Systems: Constitutional: negative for fevers and chills Gastrointestinal: negative for nausea and diarrhea Integument/breast: negative for rash  Lab Results  Component Value Date   WBC 6.7 03/05/2020   HGB 10.1 (L) 03/05/2020   HCT 33.0 (L) 03/05/2020   MCV 79.7 (L) 03/05/2020   PLT 384 03/05/2020    Lab Results  Component Value Date   CREATININE 1.31 (H) 03/05/2020   BUN 14 03/05/2020   NA 139 03/05/2020   K 3.8 03/05/2020   CL 104 03/05/2020   CO2 27 03/05/2020    Lab Results  Component Value Date   ALT 111 (H) 03/05/2020   AST 52 (H) 03/05/2020   ALKPHOS 275 (H) 03/05/2020     Microbiology: Recent Results (from the past 240 hour(s))  MRSA PCR Screening     Status: None   Collection Time: 02/25/20  7:26 AM   Specimen: Nasal Mucosa; Nasopharyngeal  Result Value Ref Range Status   MRSA by PCR NEGATIVE NEGATIVE Final    Comment:        The GeneXpert MRSA Assay (FDA approved for NASAL specimens only), is one component of a comprehensive MRSA colonization surveillance program. It is not intended to diagnose MRSA infection nor to guide or monitor treatment for MRSA infections. 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  Unclear if bacterial vegetation and no way to determine. 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  At this point, will treat as infection with cefepime for 4 weeks total through 11/21.    2.  Access - has a picc  line.  No absolute indication for removal or exchange from an infection standpoint without a gram positive organism.  Treatment as above.   3.  CVA - followed by neurology and continue management per them.   She is going to rehab.  Can pull the picc line at the end of treatment.   We are available for follow up if needed.

## 2020-03-05 NOTE — TOC Progression Note (Signed)
Transition of Care [facility]) - Progression Note    Patient Details  Name: Morgan Beasley MRN: [id number] Date of Birth: [birth year]  Transition of Care [facility]) CM/SW [place], [place] Work Phone Number: 03/05/2020, 2:37 PM  Clinical Narrative:    CSW intern contacted daughter to inform her that the patient will be discharging to [facility] tomorrow. CSW submitted clinicals to insurance for review and requested COVID test from MD.   Expected Discharge Plan: [place] Barriers to Discharge: Continued Medical Work up, SNF Pending bed offer  Expected Discharge Plan and Services Expected Discharge Plan: [place] Choice: [place] arrangements for the past 2 months: Single Family Home                                       Social Determinants of Health (SDOH) Interventions    Readmission Risk Interventions Readmission Risk Prevention Plan 10/21/2019  Transportation Screening Complete  Medication Review Press photographer) Complete  PCP or Specialist appointment within 3-5 days of discharge Complete  HRI or [place] Complete  SW Recovery Care/Counseling Consult Complete  [place] Not Applicable  Some recent data might be hidden

## 2020-03-05 NOTE — Progress Notes (Signed)
PHARMACY CONSULT NOTE FOR:  OUTPATIENT  PARENTERAL ANTIBIOTIC THERAPY (OPAT)  Indication: Endocarditis  Regimen: Cefepime 2g IV q12h  End date: 03/21/2020  IV antibiotic discharge orders are pended. To discharging provider:  please sign these orders via discharge navigator,  Select New Orders & click on the button choice - Manage This Unsigned Work.     Thank you for allowing pharmacy to be a part of this patient's care.  Phillis Haggis 03/05/2020, 1:24 PM

## 2020-03-05 NOTE — Progress Notes (Signed)
Occupational Therapy Treatment Patient Details Name: Morgan Beasley MRN: [id number] DOB: 11/06/1960 Today's Date: 03/05/2020    History of present illness Pt is a 59 y.o. female admitted 02/23/20 with LLE pain; lab work consistent with sepsis. Also with LLE cellulitis. 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   OT comments  Pt making steady progress towards OT goals this session. Pt continues to present with decreased activity tolerance, generalized weakness and impaired balance impacting pts ability to complete BADLs independently. Pt able to progress OOB to recliner with rollator and MIN A +1 this session. Pt on RA during session with SpO2 100%. Pt currently requires total A for LB ADLs ( dtr reports this is her baseline) and s/u for UB ADLs from recliner. Noted some slight cognitive impairments in the areas of orientation, following commands, problem solving, awareness, memory, attention, and safety/judgement  ( see cog section). Pt would continue to benefit from skilled occupational therapy while admitted and after d/c to address the below listed limitations in order to improve overall functional mobility and facilitate independence with BADL participation. DC plan remains appropriate, will follow acutely per POC.     Follow Up Recommendations  SNF;Supervision/Assistance - 24 hour    Equipment Recommendations  Other (comment) (TBD)    Recommendations for Other Services      Precautions / Restrictions Precautions Precautions: Fall Precaution Comments: left foot wound Restrictions Weight Bearing Restrictions: No       Mobility Bed Mobility Overal bed mobility: Needs Assistance Bed  Mobility: Supine to Sit     Supine to sit: Min assist;HOB elevated     General bed mobility comments: pt impulsively began exiting to EOB but requried light MIN A with tactile and verbal cues for sequencing of task. light MIN A to elevate trunk into sitting with HOB slightly elevated  Transfers Overall transfer level: Needs assistance Equipment used: 4-wheeled walker Transfers: Sit to/from [facility] Sit to Stand: Min assist;From elevated surface Stand pivot transfers: Min assist       General transfer comment: light MIN A to power up into standing, good hand placement noted. pt reports that he leaves her brakes locked during all transfers    Balance Overall balance assessment: Needs assistance Sitting-balance support: No upper extremity supported;Feet supported Sitting balance-Leahy Scale: Fair     Standing balance support: Bilateral upper extremity supported;During functional activity Standing balance-Leahy Scale: Poor Standing balance comment: reliant on BUE support during mobility tasks                           ADL either performed or assessed with clinical judgement   ADL Overall ADL's : Needs assistance/impaired     Grooming: Wash/dry face;Sitting;Set up Grooming Details (indicate cue type and reason): from recliner               Lower Body Dressing Details (indicate cue type and reason): pts dtr reports pt rquried assist to don socks at baseline Toilet Transfer: Minimal assistance;Stand-pivot (rollator) Toilet Transfer Details (indicate cue type and reason): MIN A for balance and initial steadying assist, min A for  rollator mgmt, pt states she leaves her brakes locked on rollator at home  Functional mobility during ADLs: Minimal assistance;Rolling walker;Cueing for safety General ADL Comments: pt continues to present with decreased activity tolerance, generalized weakness and impaired balance impacting pts ability to  complete BADLs, however pt was able to transition OOB with +1 assist. pts dtr present this session and states she is nearing her baseline     Vision Baseline Vision/History: No visual deficits Patient Visual Report: Other (comment) (when asked if pt noticed any changes in her vision she states that she felt "dizzy") Additional Comments: difficult to assess vision secondary to cognition although did not notice any visual deficts during mobility and ADL tasks   Perception     Praxis      Cognition Arousal/Alertness: Awake/alert Behavior During Therapy: Restless;Impulsive (pt teetering between resetless and impulsive at times, pt attempting to get OOB without assist and at times dismisses therapists cues and seemed restless) Overall Cognitive Status: Impaired/Different from baseline Area of Impairment: Orientation;Following commands;Problem solving;Awareness;Memory;Attention;Safety/judgement                 Orientation Level: Disoriented to;Time Current Attention Level: Sustained Memory: Decreased short-term memory Following Commands: Follows one step commands inconsistently;Follows one step commands with increased time Safety/Judgement: Decreased awareness of safety;Decreased awareness of deficits Awareness: Intellectual Problem Solving: Slow processing;Decreased initiation;Difficulty sequencing;Requires verbal cues;Requires tactile cues General Comments: pts dtr present this session and states that she does feel like her mother is nearing baseline level of cognition. pt stating " they left me here alone" often throughout session and states " don't leave me alone" at least 2x sporadically throughtout session. pt requried increased timem for processing and often had to repeat cues as pt seeemed internally distracted.        Exercises General Exercises - Lower Extremity Ankle Circles/Pumps: AROM;Both;10 reps;Seated;Supine Long Arc Quad: AROM;10 reps;Both;Seated Other  Exercises Other Exercises: seated marching from recliner BLEs x10 reps   Shoulder Instructions       General Comments entered room with pt on 3L O2 with O2 100%. doffed O2 during mobility tasks however noted effortful breathing during mobility even though SpO2 100%. pt did report dizziness once sitting EOB but feeling subsided with time    Pertinent Vitals/ Pain       Pain Assessment: No/denies pain  Home Living                                          Prior Functioning/Environment              Frequency  Min 2X/week        Progress Toward Goals  OT Goals(current goals can now be found in the care plan section)  Progress towards OT goals: Progressing toward goals  Acute Rehab OT Goals Patient Stated Goal: none stated OT Goal Formulation: With patient Time For Goal Achievement: 03/10/20 Potential to Achieve Goals: [place] Discharge plan remains appropriate;Frequency remains appropriate    Co-evaluation                 AM-PAC OT "6 Clicks" Daily Activity     Outcome Measure   Help from another person eating meals?: A Little Help from another person taking care of personal grooming?: A Little Help from another person toileting, which includes using toliet, bedpan, or urinal?: A Lot Help from another person bathing (including washing, rinsing, drying)?: A Lot Help from another person to put on and taking off  regular upper body clothing?: A Little Help from another person to put on and taking off regular lower body clothing?: Total 6 Click Score: 14    End of Session Equipment Utilized During Treatment: Gait belt;Other (comment) (rollator)  OT Visit Diagnosis: Other abnormalities of gait and mobility (R26.89);Muscle weakness (generalized) (M62.81);Pain;Other symptoms and signs involving cognitive function Pain - Right/Left: Left Pain - part of body: Leg;Ankle and joints of foot   Activity Tolerance Patient tolerated treatment well    Patient Left in chair;with call bell/phone within reach;with chair alarm set;with family/visitor present;with nursing/sitter in room   Nurse Communication Mobility status;Other (comment) (can likely pivot to [facility])        Time: 8472-0721 OT Time Calculation (min): 32 min  Charges: OT General Charges $OT Visit: 1 Visit OT Treatments $Self Care/Home Management : 23-37 mins  Lanier Clam., COTA/L Acute Rehabilitation Services [phone]    Ihor Gully 03/05/2020, 12:05 PM

## 2020-03-06 DIAGNOSIS — N179 Acute kidney failure, unspecified: Secondary | ICD-10-CM | POA: Diagnosis not present

## 2020-03-06 DIAGNOSIS — L97529 Non-pressure chronic ulcer of other part of left foot with unspecified severity: Secondary | ICD-10-CM | POA: Diagnosis not present

## 2020-03-06 DIAGNOSIS — A419 Sepsis, unspecified organism: Secondary | ICD-10-CM | POA: Diagnosis not present

## 2020-03-06 DIAGNOSIS — I5022 Chronic systolic (congestive) heart failure: Secondary | ICD-10-CM | POA: Diagnosis not present

## 2020-03-06 LAB — GLUCOSE, CAPILLARY
Glucose-Capillary: 81 mg/dL (ref 70–99)
Glucose-Capillary: 148 mg/dL — ABNORMAL HIGH (ref 70–99)
Glucose-Capillary: 219 mg/dL — ABNORMAL HIGH (ref 70–99)
Glucose-Capillary: 249 mg/dL — ABNORMAL HIGH (ref 70–99)

## 2020-03-06 MED ORDER — AMITRIPTYLINE HCL 50 MG PO TABS
100.0000 mg | ORAL_TABLET | Freq: Every day | ORAL | Status: DC
  Administered 2020-03-06 – 2020-03-17 (×12): 100 mg via ORAL
  Filled 2020-03-06 (×12): qty 2

## 2020-03-06 MED ORDER — FUROSEMIDE 10 MG/ML IJ SOLN
40.0000 mg | Freq: Once | INTRAMUSCULAR | Status: AC
  Administered 2020-03-06: 40 mg via INTRAVENOUS
  Filled 2020-03-06: qty 4

## 2020-03-06 MED ORDER — GABAPENTIN 400 MG PO CAPS
400.0000 mg | ORAL_CAPSULE | Freq: Three times a day (TID) | ORAL | Status: DC
  Administered 2020-03-06 – 2020-03-18 (×36): 400 mg via ORAL
  Filled 2020-03-06 (×39): qty 1

## 2020-03-06 NOTE — Plan of Care (Signed)
  Problem: Education: Goal: Knowledge of General Education information will improve Description: Including pain rating scale, medication(s)/side effects and non-pharmacologic comfort measures Outcome: Progressing   Problem: Health Behavior/Discharge Planning: Goal: Ability to manage health-related needs will improve Outcome: Progressing   Problem: Clinical Measurements: Goal: Ability to maintain clinical measurements within normal limits will improve Outcome: Progressing Goal: Will remain free from infection Outcome: Progressing Goal: Diagnostic test results will improve Outcome: Progressing Goal: Respiratory complications will improve Outcome: Progressing Goal: Cardiovascular complication will be avoided Outcome: Progressing   Problem: Activity: Goal: Risk for activity intolerance will decrease Outcome: Progressing   Problem: Nutrition: Goal: Adequate nutrition will be maintained Outcome: Progressing   Problem: Coping: Goal: Level of anxiety will decrease Outcome: Progressing   Problem: Elimination: Goal: Will not experience complications related to bowel motility Outcome: Progressing   Problem: Pain Managment: Goal: General experience of comfort will improve Outcome: Progressing   Problem: Skin Integrity: Goal: Risk for impaired skin integrity will decrease Outcome: Progressing   Problem: Education: Goal: Ability to describe self-care measures that may prevent or decrease complications (Diabetes Survival Skills Education) will improve Outcome: Progressing Goal: Individualized Educational Video(s) Outcome: Progressing   Problem: Coping: Goal: Ability to adjust to condition or change in health will improve Outcome: Progressing   Problem: Fluid Volume: Goal: Ability to maintain a balanced intake and output will improve Outcome: Progressing   Problem: Metabolic: Goal: Ability to maintain appropriate glucose levels will improve Outcome: Progressing    Problem: Nutritional: Goal: Maintenance of adequate nutrition will improve Outcome: Progressing Goal: Progress toward achieving an optimal weight will improve Outcome: Progressing   Problem: Skin Integrity: Goal: Risk for impaired skin integrity will decrease Outcome: Progressing   Problem: Tissue Perfusion: Goal: Adequacy of tissue perfusion will improve Outcome: Progressing   Problem: Education: Goal: Knowledge of secondary prevention will improve Outcome: Progressing Goal: Knowledge of patient specific risk factors addressed and post discharge goals established will improve Outcome: Progressing Goal: Individualized Educational Video(s) Outcome: Progressing

## 2020-03-06 NOTE — Progress Notes (Signed)
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 She was found to have sepsis secondary to Serratia bacteremia from left lower extremity cellulitis with complicated chronic nonhealing ulcer. Hospital course remarkable for persistent lactic acidosis concerning for critical lower extremity vascular ischemia. Vascular surgery consulted and she was transferred to [facility]. During this hospitalization, she developed acute nonhemorrhagic stroke, also developed AKI and shock liver due to hypotension. Neurology following for stroke.Underwnt TEE on 11.4.21. Hospital course remarkable for persistent encephalopathy but now the mental status has improved.  TEE showed pulmonary valve vegetation.  Plan to continue cefepime till 03/21/2020.  She is medically stable for discharge to skilled nursing facility as soon as bed is available.   Assessment & Plan:   Principal Problem:   Septic shock ([place]) Active Problems:   Uncontrolled diabetes mellitus with circulatory complication, with long-term current use of insulin (HCC)   COPD (chronic obstructive pulmonary disease) (HCC)   PVD (peripheral vascular disease) (HCC)   Ischemia of left lower extremity   Chronic systolic CHF (congestive heart failure) (HCC)   Obesity  (BMI 30-39.9)   Cerebral embolism with cerebral infarction   Septic shock/gram-negative bacteremia: In the setting of  bilateral lower extremity peripheral artery disease with chronic left lower extremity nonhealing ulcer diabetic patient. Hospital course remarkable for shock, AKI. She was treated with IV fluids. Sepsis physiology has improved. Continue current antibiotics and wound care.ID also consulted for finding of possible pulmonary valve vegetation.  Planning to continue cefepime through 03/21/2020.  Acute nonhemorrhagic stroke: Hospital course remarkable for encephalopathy secondary to acute right occipital infarct. She developed mild left arm weakness. Neurology following. Stroke work-up completeds. She has multiple risk factors including uncontrolled diabetes with hemoglobin A1c of 15, tobacco use, severe peripheral artery disease, elevated LDL. Plan to resume a statin after improvement in the liver function. On Plavix currently. Neurology following. TEE showed ejection fraction of 40%, no PFO or  cardiac source of emboli but probable pulmonary valve vegetation.  Also showed catheter thrombus. PT/OT evaluation done and recommended skilled nursing facility.  Possible pulmonary valve vegetation/catheter related vegetation versus thrombus: Uncommon scenario of finding of possible pulmonary vegetation in the setting of gram-negative bacteremia. ID was following . No need to start anticoagulation for catheter related thrombus. No need to remove central line.  Continue cefepime till 03/21/2020.  Acute encephalopathy: Patient is intermittently agitated and confused but mental status has  improved since last few days. This could be most likely associated with the stroke. Continue to monitor mental status.  Peripheral artery disease/nonhealing lower extremity ulcer: Vascular surgery following. She is status post left femoropopliteal bypass on 10/19/2019. On Plavix and statin at home.  Vascular surgery  ordered ABI/lower extremity arterial duplex  Chronic combined systolic/diastolic congestive heart failure: Echo done few months ago showed ejection fraction of 40 to 40% with global hypokinesis. Elevated BNP. She received fluids  during this hospitalization for septic shock.  She looks volume overloaded.  She takes 40 mg of Lasix at home daily.  She looks volume overloaded today.  We are ordering 2 days of Lasix 40 mg today. will monitor input and output.  Elevated liver enzymes: Most likely from shock liver from hypotension. Right upper quadrant ultrasound showed nonspecific changes. She was seen by general surgery, no plan for intervention. Acute hepatitis panel negative. Right upper quadrant liver Doppler stable. Questionable early NASH on right upper quadrant ultrasound.Liver enzymes improving  Electrolyte abnormalities: Continue to monitor and supplement.  Diabetes type 2/uncontrolled hyperglycemia: Hemoglobin A1c of 15. Continue current insulin regimen. Monitor blood sugars.  Anemia of chronic disease: Currently hemoglobin is stable. Continue to monitor.  AKI on CKD stage IIIa: Baseline creatinine 1.4. Hospital course remarkable for AKI secondary to contrast nephropathy, dehydration, hypotension. Currently kidney function at baseline.  Hyperlipidemia: On statin and Zetia. Will resume statin when liver function improves.  COPD/tobacco abuse: Counseled for smoking cessation. Currently stable in terms of COPD.  Obesity with OSA: BMI of 32. Uses BiPAP at night at home.  Hypertension: Hospital course remarkable for hypotension. Beta-blockers on hold. Monitor blood pressure.  Reactive mediastinal/hilar/right supraclavicular adenopathy: Incidental finding during this hospitalization. Outpatient biopsy recommended  Severe anxiety: Patient has anxious spells with tachypnea but at times remains comfortable.  We resumed home medications amitriptyline and gabapentin at low-dose.  Continue lorazepam  as needed.  Debility/deconditioning: PT/OT recommended SN facility on discharge..      Nutrition Problem: Increased nutrient needs Etiology: wound healing      DVT prophylaxis:Heparin [place] Code Status: Full Family Communication: Discussed with daughter at bedside on 03/05/2020. Status is: Inpatient  Remains inpatient appropriate because:Inpatient level of care appropriate due to severity of illness   Dispo: The patient is from: Home              Anticipated d/c is to: SNF               Anticipated d/c date is:As soon as bed available              Patient currently is medically stable for discharge     Consultants: Vascular surgery, Neurology   Procedures:  Antimicrobials:  Anti-infectives (From admission, onward)   Start     Dose/Rate Route Frequency Ordered Stop   03/02/20 1800  ceFEPIme (MAXIPIME) 2 g in sodium chloride 0.9 % 100 mL IVPB        2 g 200 mL/hr over 30 Minutes Intravenous Every 12 hours 03/02/20 0853     02/28/20 0546  ceFEPIme (MAXIPIME) 2 g in sodium chloride 0.9 % 100 mL IVPB  Status:  Discontinued        2 g 200 mL/hr over 30 Minutes Intravenous Every 24 hours 02/27/20 1041 03/02/20 0853   02/25/20 1400  metroNIDAZOLE (FLAGYL) tablet 500 mg  Status:  Discontinued        500 mg Oral Every 8 hours 02/25/20 1025 02/29/20 0911   02/24/20 1800  ceFEPIme (MAXIPIME) 2 g in sodium chloride 0.9 % 100 mL IVPB  Status:  Discontinued        2 g 200 mL/hr over 30 Minutes Intravenous Every 12 hours 02/24/20 0732 02/27/20 1041   02/24/20 0600  ceFEPIme (MAXIPIME) 2 g in sodium chloride 0.9 % 100 mL IVPB  Status:  Discontinued        2 g 200 mL/hr over 30 Minutes Intravenous Every 8 hours 02/24/20 0440  02/24/20 0732   02/23/20 1015  vancomycin (VANCOREADY) IVPB 1750 mg/350 mL        1,750 mg 175 mL/hr over 120 Minutes Intravenous  Once 02/23/20 1007 02/23/20 1347   02/23/20 1000  cefTRIAXone (ROCEPHIN) 2 g in sodium chloride 0.9 % 100 mL IVPB  Status:   Discontinued        2 g 200 mL/hr over 30 Minutes Intravenous Every 24 hours 02/23/20 0952 02/24/20 0440   02/23/20 1000  metroNIDAZOLE (FLAGYL) IVPB 500 mg  Status:  Discontinued        500 mg 100 mL/hr over 60 Minutes Intravenous Every 8 hours 02/23/20 0952 02/25/20 1025      Subjective:  Patient seen and examined at the bedside this morning.  Sitting on the chair.  Anxious and little restless.  Looks volume overloaded  today.  Overall looks comfortable  Objective: Vitals:   03/05/20 2349 03/05/20 2356 03/06/20 0308 03/06/20 0738  BP: (!) 116/93  (!) 150/63 (!) 124/94  Pulse: 94 87 79 90  Resp: 18 (!) 26 (!) 24 (!) 38  Temp: 97.6 F (36.4 C)  97.7 F (36.5 C) 97.8 F (36.6 C)  TempSrc: Axillary  Axillary Oral  SpO2: 100% 100% 96% 99%  Weight:      Height:        Intake/Output Summary (Last 24 hours) at 03/06/2020 0818 Last data filed at 03/06/2020 0645 Gross per 24 hour  Intake 240 ml  Output 150 ml  Net 90 ml   Filed Weights   02/23/20 0937 03/04/20 0741  Weight: 90.3 kg 86.2 kg    Examination:   General exam: Comfortable, obese HEENT:PERRL,Oral mucosa moist, Ear/Nose normal on gross exam Respiratory system: Bilateral equal air entry, normal vesicular breath sounds, no wheezes or crackles  Cardiovascular system: S1 & S2 heard, RRR. No JVD, murmurs, rubs, gallops or clicks. Gastrointestinal system: Abdomen is nondistended, soft and nontender. No organomegaly or masses felt. Normal bowel sounds heard. Central nervous system: Alert and oriented.  Mild weakness in the left upper extremity Extremities: Bilateral  lower extremity edema, no clubbing ,no cyanosis, mild edema of the left upper extremity.  PICC line on the left arm skin: Ulcer on the dorsum of the left foot,no icterus ,no pallor    Data Reviewed: I have personally reviewed following labs and imaging studies  CBC: Recent Labs  Lab 03/01/20 0500 03/02/20 0431 03/03/20 0606 03/04/20 0537  03/05/20 0923  WBC 6.8 5.8 8.0 7.7 6.7  NEUTROABS 4.6 3.7 4.9 4.8 4.4  HGB 8.5* 8.1* 10.5* 10.2* 10.1*  HCT 28.2* 26.4* 34.0* 32.7* 33.0*  MCV 81.0 81.5 78.9* 79.4* 79.7*  PLT 350 337 441* 413* 010   Basic Metabolic Panel: Recent Labs  Lab 03/01/20 0500 03/02/20 0431 03/03/20 0606 03/04/20 0537 03/05/20 0923  NA 135 135 136 139 139  K 3.3* 3.3* 4.1 3.6 3.8  CL 120* 124* 104 104 104  CO2 20* 18* 25 27 27   GLUCOSE 64* 104* 144* 126* 182*  BUN 27* 20 18 15 14   CREATININE 1.87* 1.49* 1.49* 1.28* 1.31*  CALCIUM 6.4* 6.2* 8.5* 8.5* 8.5*  MG 1.6* 1.4* 2.0 1.5* 1.8   GFR: Estimated Creatinine Clearance: 51.8 mL/min (A) (by C-G formula based on SCr of 1.31 mg/dL (H)). Liver Function Tests: Recent Labs  Lab 03/01/20 0500 03/02/20 0431 03/03/20 0606 03/04/20 0537 03/05/20 0923  AST 266* 121* 91* 65* 52*  ALT 213* 152* 176* 138* 111*  ALKPHOS 214* 249* 332* 292*  275*  BILITOT 0.6 0.6 0.5 0.7 0.5  PROT 5.4* 5.5* 6.2* 6.3* 6.5  ALBUMIN 1.2* 1.1* 1.7* 1.7* 1.8*   No results for input(s): LIPASE, AMYLASE in the last 168 hours. No results for input(s): AMMONIA in the last 168 hours. Coagulation Profile: Recent Labs  Lab 02/29/20 0455 03/01/20 0500 03/02/20 0431 03/03/20 0606 03/04/20 0537  INR 2.1* 2.7* 2.1* 1.4* 1.3*   Cardiac Enzymes: No results for input(s): CKTOTAL, CKMB, CKMBINDEX, TROPONINI in the last 168 hours. BNP (last 3 results) Recent Labs    12/19/19 1546 02/09/20 0825  PROBNP 1,148* 1,185*   HbA1C: No results for input(s): HGBA1C in the last 72 hours. CBG: Recent Labs  Lab 03/05/20 0726 03/05/20 1142 03/05/20 1611 03/05/20 2103 03/06/20 0737  GLUCAP 123* 204* 181* 153* 81   Lipid Profile: No results for input(s): CHOL, HDL, LDLCALC, TRIG, CHOLHDL, LDLDIRECT in the last 72 hours. Thyroid Function Tests: No results for input(s): TSH, T4TOTAL, FREET4, T3FREE, THYROIDAB in the last 72 hours. Anemia Panel: No results for input(s): VITAMINB12,  FOLATE, FERRITIN, TIBC, IRON, RETICCTPCT in the last 72 hours. Sepsis Labs: No results for input(s): PROCALCITON, LATICACIDVEN in the last 168 hours.  Recent Results (from the past 240 hour(s))  Culture, blood (routine x 2)     Status: None (Preliminary result)   Collection Time: 03/05/20  2:37 PM   Specimen: BLOOD LEFT HAND  Result Value Ref Range Status   Specimen Description BLOOD LEFT HAND  Final   Special Requests   Final    BOTTLES DRAWN AEROBIC ONLY Blood Culture results may not be optimal due to an inadequate volume of blood received in culture bottles   Culture   Final    NO GROWTH < 24 HOURS Performed at [facility], [place] [address] [place], [place] [address]    Report Status PENDING  Incomplete  Culture, blood (routine x 2)     Status: None (Preliminary result)   Collection Time: 03/05/20  2:37 PM   Specimen: BLOOD LEFT HAND  Result Value Ref Range Status   Specimen Description BLOOD LEFT HAND  Final   Special Requests   Final    BOTTLES DRAWN AEROBIC ONLY Blood Culture results may not be optimal due to an inadequate volume of blood received in culture bottles   Culture   Final    NO GROWTH < 24 HOURS Performed at [facility], [place] [address] [place], [place] [address]    Report Status PENDING  Incomplete  Respiratory Panel by RT PCR (Flu A&B, Covid) - Nasopharyngeal Swab     Status: None   Collection Time: 03/05/20 10:16 PM   Specimen: Nasopharyngeal Swab  Result Value Ref Range Status   SARS Coronavirus 2 by RT PCR NEGATIVE NEGATIVE Final    Comment: (NOTE) SARS-CoV-2 target nucleic acids are NOT DETECTED.  The SARS-CoV-2 RNA is generally detectable in upper respiratoy specimens during the acute phase of infection. The lowest concentration of SARS-CoV-2 viral copies this assay can detect is 131 copies/mL. A negative result does not preclude SARS-Cov-2 infection and should not be used as the sole basis for treatment or other patient management  decisions. A negative result may occur with  improper specimen collection/handling, submission of specimen other than nasopharyngeal swab, presence of viral mutation(s) within the areas targeted by this assay, and inadequate number of viral copies (<131 copies/mL). A negative result must be combined with clinical observations, patient history, and epidemiological information. The expected result  is Negative.  Fact Sheet for Patients:  PinkCheek.be  Fact Sheet for Healthcare Providers:  GravelBags.it  This test is no t yet approved or cleared by the [place] FDA and  has been authorized for detection and/or diagnosis of SARS-CoV-2 by FDA under an Emergency Use Authorization (EUA). This EUA will remain  in effect (meaning this test can be used) for the duration of the COVID-19 declaration under Section 564(b)(1) of the Act, 21 U.S.C. section 360bbb-3(b)(1), unless the authorization is terminated or revoked sooner.     Influenza A by PCR NEGATIVE NEGATIVE Final   Influenza B by PCR NEGATIVE NEGATIVE Final    Comment: (NOTE) The Xpert Xpress SARS-CoV-2/FLU/RSV assay is intended as an aid in  the diagnosis of influenza from Nasopharyngeal swab specimens and  should not be used as a sole basis for treatment. Nasal washings and  aspirates are unacceptable for Xpert Xpress SARS-CoV-2/FLU/RSV  testing.  Fact Sheet for Patients: PinkCheek.be  Fact Sheet for Healthcare Providers: GravelBags.it  This test is not yet approved or cleared by the [place] FDA and  has been authorized for detection and/or diagnosis of SARS-CoV-2 by  FDA under an Emergency Use Authorization (EUA). This EUA will remain  in effect (meaning this test can be used) for the duration of the  Covid-19 declaration under Section 564(b)(1) of the Act, 21  U.S.C. section 360bbb-3(b)(1), unless the  authorization is  terminated or revoked. Performed at [facility], [place] [address], [place], [place] [address]          Radiology Studies: ECHO TEE  Result Date: 03/04/2020    TRANSESOPHOGEAL ECHO REPORT   Patient Name:   JERIANNE ANSELMO [facility] Date of Exam: 03/04/2020 Medical Rec #:  [id number]            Height:       66.0 in Accession #:    [id number]           Weight:       190.0 lb Date of Birth:  1960-09-07             BSA:          1.957 m Patient Age:    58 years             BP:           73/54 mmHg Patient Gender: F                    HR:           117 bpm. Exam Location:  Inpatient Procedure: Transesophageal Echo, Cardiac Doppler, Color Doppler and Saline            Contrast Bubble Study Indications:     Stroke 434.91 / I163.9  History:         Patient has prior history of Echocardiogram examinations, most                  recent 03/01/2020. CHF, COPD; Risk Factors:Hypertension,                  Diabetes and Dyslipidemia.  Sonographer:     Jonelle Sidle Dance Referring Phys:  [id number] Darreld Mclean Diagnosing Phys: Cherlynn Kaiser MD PROCEDURE: After discussion of the risks and benefits of a TEE, an informed consent was obtained from the patient. TEE procedure time was 25 minutes. The transesophogeal probe was passed without difficulty through the esophogus of the patient. Imaged were obtained  with the patient in a supine position. Local oropharyngeal anesthetic was provided with Cetacaine. Sedation performed by different physician. The patient was monitored while under deep sedation. Anesthestetic sedation was provided intravenously by Anesthesiology: 367.56mg  of Propofol. Image quality was good. The patient's vital signs; including heart rate, blood pressure, and oxygen saturation; remained stable throughout the procedure. The patient developed Atrial Fibrillation during the procedure. Imaging probe error with gain control is noted on images throughout study. IMPRESSIONS  1. There is a mobile  heterogeneous echodensity attached to the transvenous catheter, seen in the mid and distal SVC, suggestive of catheter related vegetation or thrombus.  2. Left ventricular ejection fraction, by estimation, is 40%. The left ventricle has mildly decreased function.  3. Right ventricular systolic function is moderately reduced. The right ventricular size is moderately enlarged.  4. Left atrial size was mildly dilated. No left atrial/left atrial appendage thrombus was detected. The LAA emptying velocity was 57 cm/s.  5. Right atrial size was moderately dilated.  6. The mitral valve is grossly normal. Trivial mitral valve regurgitation.  7. The aortic valve is tricuspid. Aortic valve regurgitation is trivial. No aortic stenosis is present.  8. Probable small pulmonary valve vegetation. Mobile echodensity noted on the PA aspect of the pulmonary valve.. The pulmonic valve was abnormal.  9. Agitated saline contrast bubble study was negative, with no evidence of any interatrial shunt. FINDINGS  Left Ventricle: Left ventricular ejection fraction, by estimation, is 40%. The left ventricle has mildly decreased function. The left ventricular internal cavity size was normal in size. Right Ventricle: The right ventricular size is moderately enlarged. No increase in right ventricular wall thickness. Right ventricular systolic function is moderately reduced. Left Atrium: Left atrial size was mildly dilated. No left atrial/left atrial appendage thrombus was detected. The LAA emptying velocity was 57 cm/s. Right Atrium: Right atrial size was moderately dilated. Pericardium: There is no evidence of pericardial effusion. Mitral Valve: The mitral valve is grossly normal. Trivial mitral valve regurgitation. Tricuspid Valve: The tricuspid valve is normal in structure. Tricuspid valve regurgitation is trivial. Aortic Valve: The aortic valve is tricuspid. Aortic valve regurgitation is trivial. No aortic stenosis is present. Pulmonic Valve:  Probable small pulmonary valve vegetation. Mobile echodensity noted on the PA aspect of the pulmonary valve. The pulmonic valve was abnormal. Pulmonic valve regurgitation is trivial. Aorta: The aortic root and ascending aorta are structurally normal, with no evidence of dilitation. IAS/Shunts: There is left bowing of the interatrial septum, suggestive of elevated right atrial pressure. No atrial level shunt detected by color flow Doppler. Agitated saline contrast was given intravenously to evaluate for intracardiac shunting. Agitated saline contrast bubble study was negative, with no evidence of any interatrial shunt. Additional Comments: There is a small pleural effusion. Cherlynn Kaiser MD Electronically signed by Cherlynn Kaiser MD Signature Date/Time: 03/04/2020/11:29:17 PM    Final         Scheduled Meds: . amitriptyline  100 mg Oral QHS  . Chlorhexidine Gluconate Cloth  6 each Topical Daily  . clopidogrel  75 mg Oral Daily  . collagenase   Topical Daily  . fluticasone furoate-vilanterol  1 puff Inhalation Daily  . furosemide  40 mg Oral Daily  . gabapentin  400 mg Oral TID  . heparin injection (subcutaneous)  5,000 Units Subcutaneous Q8H  . insulin aspart  0-15 Units Subcutaneous TID WC  . insulin aspart  0-5 Units Subcutaneous QHS  . insulin glargine  20 Units Subcutaneous Daily  . lipase/protease/amylase  72,000 Units Oral TID WC  . metoprolol succinate  50 mg Oral Daily  . multivitamin with minerals  1 tablet Oral Daily  . nicotine  7 mg Transdermal Daily  . pantoprazole  40 mg Oral Daily   Continuous Infusions: . ceFEPime (MAXIPIME) IV 2 g (03/06/20 1388)  . lactated ringers 10 mL/hr at 03/04/20 0756     LOS: 12 days    Time spent: 35 mins.More than 50% of that time was spent in counseling and/or coordination of care.      Shelly Coss, MD [facility] P11/09/2019, 8:18 AM

## 2020-03-07 DIAGNOSIS — N179 Acute kidney failure, unspecified: Secondary | ICD-10-CM | POA: Diagnosis not present

## 2020-03-07 DIAGNOSIS — A419 Sepsis, unspecified organism: Secondary | ICD-10-CM | POA: Diagnosis not present

## 2020-03-07 DIAGNOSIS — I5022 Chronic systolic (congestive) heart failure: Secondary | ICD-10-CM | POA: Diagnosis not present

## 2020-03-07 DIAGNOSIS — L97529 Non-pressure chronic ulcer of other part of left foot with unspecified severity: Secondary | ICD-10-CM | POA: Diagnosis not present

## 2020-03-07 LAB — BASIC METABOLIC PANEL
Anion gap: 7 (ref 5–15)
BUN: 17 mg/dL (ref 6–20)
CO2: 26 mmol/L (ref 22–32)
Calcium: 8.4 mg/dL — ABNORMAL LOW (ref 8.9–10.3)
Chloride: 105 mmol/L (ref 98–111)
Creatinine, Ser: 1.35 mg/dL — ABNORMAL HIGH (ref 0.44–1.00)
GFR, Estimated: 46 mL/min — ABNORMAL LOW (ref >60)
Glucose, Bld: 208 mg/dL — ABNORMAL HIGH (ref 70–99)
Potassium: 3.6 mmol/L (ref 3.5–5.1)
Sodium: 138 mmol/L (ref 135–145)

## 2020-03-07 LAB — GLUCOSE, CAPILLARY
Glucose-Capillary: 142 mg/dL — ABNORMAL HIGH (ref 70–99)
Glucose-Capillary: 170 mg/dL — ABNORMAL HIGH (ref 70–99)
Glucose-Capillary: 210 mg/dL — ABNORMAL HIGH (ref 70–99)
Glucose-Capillary: 273 mg/dL — ABNORMAL HIGH (ref 70–99)

## 2020-03-07 MED ORDER — LORAZEPAM 2 MG/ML IJ SOLN: 1.0000 mg | Freq: Once | INTRAMUSCULAR | Status: DC

## 2020-03-07 MED ORDER — CEFEPIME IV (FOR PTA / DISCHARGE USE ONLY): 2.0000 g | Freq: Two times a day (BID) | INTRAVENOUS | 0 refills | Status: DC

## 2020-03-07 MED ORDER — FUROSEMIDE 10 MG/ML IJ SOLN
60.0000 mg | Freq: Two times a day (BID) | INTRAMUSCULAR | Status: DC
  Administered 2020-03-07 – 2020-03-09 (×5): 60 mg via INTRAVENOUS
  Filled 2020-03-07 (×6): qty 6

## 2020-03-07 MED ORDER — QUETIAPINE FUMARATE 25 MG PO TABS
25.0000 mg | ORAL_TABLET | Freq: Every day | ORAL | Status: DC
  Administered 2020-03-07 – 2020-03-08 (×2): 25 mg via ORAL
  Filled 2020-03-07 (×2): qty 1

## 2020-03-07 MED ORDER — LORAZEPAM 2 MG/ML IJ SOLN
1.0000 mg | Freq: Once | INTRAMUSCULAR | Status: DC
  Filled 2020-03-07: qty 1

## 2020-03-07 MED ORDER — TORSEMIDE 20 MG PO TABS
40.0000 mg | ORAL_TABLET | Freq: Every day | ORAL | Status: DC
  Filled 2020-03-07: qty 2

## 2020-03-07 MED ORDER — LORAZEPAM 2 MG/ML IJ SOLN: 1.0000 mg | INTRAMUSCULAR | Status: DC

## 2020-03-07 MED ORDER — LORAZEPAM 0.5 MG PO TABS: 0.5000 mg | ORAL_TABLET | Freq: Four times a day (QID) | ORAL | 0 refills | Status: DC | PRN

## 2020-03-07 NOTE — Progress Notes (Signed)
Patient was very aggressive this morning. Found her getting out of bed and went to help patient and she told me to get off her because I put that thing in her arm (pointing to PICC line). Attempted to help her to the chair per her request, but the patient did not want to be assisted to chair. Explained that RN and 2 nurse techs were there to help her and make sure she didn't fall. Patient cussing and not wanting anyone to touch her. Called patients daughter and daughter was trying to calm her mother down.  Was able to give 5 mg IV haldol and patient slowly calmed down and allowed staff to help her into the bed.

## 2020-03-07 NOTE — Progress Notes (Signed)
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 She was found to have sepsis secondary to Serratia bacteremia from left lower extremity cellulitis with complicated chronic nonhealing ulcer. Hospital course remarkable for persistent lactic acidosis concerning for critical lower extremity vascular ischemia. Vascular surgery consulted and she was transferred to [facility]. During this hospitalization, she developed acute nonhemorrhagic stroke, also developed AKI and shock liver due to hypotension. Neurology following for stroke.Underwnt TEE on 11.4.21. Hospital course remarkable for persistent encephalopathy but now the mental status has improved.  TEE showed pulmonary valve vegetation.  Plan to continue cefepime till 03/21/2020.    Assessment & Plan:   Principal Problem:   Septic shock ([place]) Active Problems:   Uncontrolled diabetes mellitus with circulatory complication, with long-term current use of insulin (HCC)   COPD (chronic obstructive pulmonary disease) (HCC)   PVD (peripheral vascular disease) (HCC)   Ischemia of left lower extremity   Chronic systolic CHF (congestive heart failure) (HCC)   Obesity (BMI 30-39.9)   Cerebral embolism with cerebral infarction   Septic shock/gram-negative  bacteremia: In the setting of  bilateral lower extremity peripheral artery disease with chronic left lower extremity nonhealing ulcer diabetic patient. Hospital course remarkable for shock, AKI. She was treated with IV fluids. Sepsis physiology has improved. Continue current antibiotics and wound care.ID also consulted for finding of possible pulmonary valve vegetation.  Planning to continue cefepime through 03/21/2020.  Acute nonhemorrhagic stroke: Hospital course remarkable for encephalopathy secondary to acute right occipital infarct. She developed mild left arm weakness. Neurology following. Stroke work-up completeds. She has multiple risk factors including uncontrolled diabetes with hemoglobin A1c of 15, tobacco use, severe peripheral artery disease, elevated LDL. Plan to resume a statin after improvement in the liver function. On Plavix currently. Neurology following. TEE showed ejection fraction of 40%, no PFO or  cardiac source of emboli but probable pulmonary valve vegetation.  Also showed catheter thrombus. PT/OT evaluation done and recommended skilled nursing facility.  Possible pulmonary valve vegetation/catheter related vegetation versus thrombus: Uncommon scenario of finding of possible pulmonary vegetation in the setting of gram-negative bacteremia. ID was following . No need to start anticoagulation for catheter related thrombus. No need to remove central line.  Continue cefepime till 03/21/2020.  Acute encephalopathy: Patient is intermittently agitated and confused but mental status has  improved . This could be most likely associated with the stroke. Continue to monitor mental status.Conitnue sedatives for severe agitation.Also started on seroquel  Peripheral artery disease/nonhealing lower extremity ulcer: Vascular surgery following. She is status post left femoropopliteal bypass on 10/19/2019. On Plavix and statin at home.  Vascular surgery ordered ABI/lower extremity arterial duplex  for outpatient follow up.  Chronic combined systolic/diastolic congestive heart failure: Echo done few months ago showed ejection fraction of 40 to 40% with global hypokinesis. Elevated BNP. She received fluids during this hospitalization for septic shock.  She  still looks volume overloaded.  She takes 40 mg of Lasix at home daily.  She looks volume overloaded today.  We are continuing Lasix 60 mg IV BID. will monitor input and output.  Elevated liver enzymes: Most likely from shock liver from hypotension. Right upper quadrant ultrasound showed nonspecific changes. She was seen by general surgery, no plan for intervention. Acute hepatitis panel negative. Right upper quadrant liver Doppler stable. Questionable early NASH on right upper quadrant ultrasound.Liver enzymes improving  Electrolyte abnormalities: Continue to monitor and supplement.  Diabetes type 2/uncontrolled hyperglycemia: Hemoglobin A1c of 15. Continue current insulin regimen. Monitor blood sugars.  Anemia of chronic disease: Currently hemoglobin is stable. Continue to monitor.  AKI on CKD stage IIIa: Baseline creatinine 1.4. Hospital course remarkable for AKI secondary to contrast nephropathy, dehydration, hypotension. Currently kidney function at baseline.  Hyperlipidemia: On statin and Zetia. Will resume statin when liver function improves.  COPD/tobacco abuse: Counseled for smoking cessation. Currently stable in terms of COPD.  Obesity with OSA: BMI of 32. Uses BiPAP at night at home.  Hypertension: Hospital course remarkable for hypotension. Beta-blockers on hold. Monitor blood pressure.  Reactive mediastinal/hilar/right supraclavicular adenopathy: Incidental finding during this hospitalization. Outpatient biopsy recommended  Severe anxiety: Patient has anxious spells with tachypnea but at times remains comfortable.  We resumed home medications amitriptyline and gabapentin at low-dose.  Continue lorazepam as  needed.  Debility/deconditioning: PT/OT recommended SN facility on discharge..      Nutrition Problem: Increased nutrient needs Etiology: wound healing      DVT prophylaxis:Heparin [place] Code Status: Full Family Communication: Discussed with daughter at bedside on 03/05/2020. Status is: Inpatient  Remains inpatient appropriate because:Inpatient level of care appropriate due to severity of illness   Dispo: The patient is from: Home              Anticipated d/c is to: SNF               Anticipated d/c date is:1-2 days              Patient currently is not medically stable for discharge     Consultants: Vascular surgery, Neurology   Procedures:  Antimicrobials:  Anti-infectives (From admission, onward)   Start     Dose/Rate Route Frequency Ordered Stop   03/07/20 0000  ceFEPime (MAXIPIME) IVPB        2 g Intravenous Every 12 hours 03/07/20 0819 03/23/20 2359   03/02/20 1800  ceFEPIme (MAXIPIME) 2 g in sodium chloride 0.9 % 100 mL IVPB        2 g 200 mL/hr over 30 Minutes Intravenous Every 12 hours 03/02/20 0853     02/28/20 0546  ceFEPIme (MAXIPIME) 2 g in sodium chloride 0.9 % 100 mL IVPB  Status:  Discontinued        2 g 200 mL/hr over 30 Minutes Intravenous Every 24 hours 02/27/20 1041 03/02/20 0853   02/25/20 1400  metroNIDAZOLE (FLAGYL) tablet 500 mg  Status:  Discontinued        500 mg Oral Every 8 hours 02/25/20 1025 02/29/20 0911   02/24/20 1800  ceFEPIme (MAXIPIME) 2 g in sodium chloride 0.9 % 100 mL IVPB  Status:  Discontinued        2 g 200 mL/hr over 30 Minutes Intravenous Every 12 hours 02/24/20 0732 02/27/20 1041   02/24/20 0600  ceFEPIme (MAXIPIME) 2 g in sodium chloride 0.9 % 100 mL IVPB  Status:  Discontinued  2 g 200 mL/hr over 30 Minutes Intravenous Every 8 hours 02/24/20 0440 02/24/20 0732   02/23/20 1015  vancomycin (VANCOREADY) IVPB 1750 mg/350 mL        1,750 mg 175 mL/hr over 120 Minutes Intravenous  Once 02/23/20 1007 02/23/20 1347    02/23/20 1000  cefTRIAXone (ROCEPHIN) 2 g in sodium chloride 0.9 % 100 mL IVPB  Status:  Discontinued        2 g 200 mL/hr over 30 Minutes Intravenous Every 24 hours 02/23/20 0952 02/24/20 0440   02/23/20 1000  metroNIDAZOLE (FLAGYL) IVPB 500 mg  Status:  Discontinued        500 mg 100 mL/hr over 60 Minutes Intravenous Every 8 hours 02/23/20 0952 02/25/20 1025      Subjective:  Patient seen and examined at the bedside this morning. Got a report that she was very agitated this morning and had to be given Haldol. She  also looks very volume overloaded with need to continue IV Lasix. Discharge planning to SNF canceled  Objective: Vitals:   03/07/20 0314 03/07/20 0740 03/07/20 0748 03/07/20 0949  BP: (!) 146/78 (!) 131/121  (!) 140/98  Pulse: 92 96    Resp: 20 (!) 27  20  Temp: 97.6 F (36.4 C) (!) 97.4 F (36.3 C)    TempSrc: Oral Axillary    SpO2: 96% 99% 99% 99%  Weight:      Height:        Intake/Output Summary (Last 24 hours) at 03/07/2020 1144 Last data filed at 03/07/2020 0600 Gross per 24 hour  Intake 322.57 ml  Output 1100 ml  Net -777.43 ml   Filed Weights   02/23/20 0937 03/04/20 0741  Weight: 90.3 kg 86.2 kg    Examination:   General exam: Comfortable during my evaluation, lying on the bed Respiratory system: Diminished air sounds bilaterally but no wheezes or crackles Cardiovascular system: S1 & S2 heard, RRR. No JVD, murmurs, rubs, gallops or clicks. Gastrointestinal system: Abdomen is nondistended, soft and nontender. No organomegaly or masses felt. Normal bowel sounds heard. Central nervous system: Alert and awake, mild weakness on the left upper extremity. Extremities: 2-3+ bilateral lower extremity edema, no clubbing ,no cyanosis, PICC line on the left upper extremity Skin: Ulcer on the dorsum of the left foot     Data Reviewed: I have personally reviewed following labs and imaging studies  CBC: Recent Labs  Lab 03/01/20 0500 03/02/20 0431  03/03/20 0606 03/04/20 0537 03/05/20 0923  WBC 6.8 5.8 8.0 7.7 6.7  NEUTROABS 4.6 3.7 4.9 4.8 4.4  HGB 8.5* 8.1* 10.5* 10.2* 10.1*  HCT 28.2* 26.4* 34.0* 32.7* 33.0*  MCV 81.0 81.5 78.9* 79.4* 79.7*  PLT 350 337 441* 413* 546   Basic Metabolic Panel: Recent Labs  Lab 03/01/20 0500 03/01/20 0500 03/02/20 0431 03/03/20 0606 03/04/20 0537 03/05/20 0923 03/07/20 0342  NA 135   < > 135 136 139 139 138  K 3.3*   < > 3.3* 4.1 3.6 3.8 3.6  CL 120*   < > 124* 104 104 104 105  CO2 20*   < > 18* 25 27 27 26   GLUCOSE 64*   < > 104* 144* 126* 182* 208*  BUN 27*   < > 20 18 15 14 17   CREATININE 1.87*   < > 1.49* 1.49* 1.28* 1.31* 1.35*  CALCIUM 6.4*   < > 6.2* 8.5* 8.5* 8.5* 8.4*  MG 1.6*  --  1.4* 2.0 1.5* 1.8  --    < > =  values in this interval not displayed.   GFR: Estimated Creatinine Clearance: 50.3 mL/min (A) (by C-G formula based on SCr of 1.35 mg/dL (H)). Liver Function Tests: Recent Labs  Lab 03/01/20 0500 03/02/20 0431 03/03/20 0606 03/04/20 0537 03/05/20 0923  AST 266* 121* 91* 65* 52*  ALT 213* 152* 176* 138* 111*  ALKPHOS 214* 249* 332* 292* 275*  BILITOT 0.6 0.6 0.5 0.7 0.5  PROT 5.4* 5.5* 6.2* 6.3* 6.5  ALBUMIN 1.2* 1.1* 1.7* 1.7* 1.8*   No results for input(s): LIPASE, AMYLASE in the last 168 hours. No results for input(s): AMMONIA in the last 168 hours. Coagulation Profile: Recent Labs  Lab 03/01/20 0500 03/02/20 0431 03/03/20 0606 03/04/20 0537  INR 2.7* 2.1* 1.4* 1.3*   Cardiac Enzymes: No results for input(s): CKTOTAL, CKMB, CKMBINDEX, TROPONINI in the last 168 hours. BNP (last 3 results) Recent Labs    12/19/19 1546 02/09/20 0825  PROBNP 1,148* 1,185*   HbA1C: No results for input(s): HGBA1C in the last 72 hours. CBG: Recent Labs  Lab 03/06/20 1250 03/06/20 1644 03/06/20 2109 03/07/20 0742 03/07/20 1125  GLUCAP 148* 219* 249* 142* 170*   Lipid Profile: No results for input(s): CHOL, HDL, LDLCALC, TRIG, CHOLHDL, LDLDIRECT in the  last 72 hours. Thyroid Function Tests: No results for input(s): TSH, T4TOTAL, FREET4, T3FREE, THYROIDAB in the last 72 hours. Anemia Panel: No results for input(s): VITAMINB12, FOLATE, FERRITIN, TIBC, IRON, RETICCTPCT in the last 72 hours. Sepsis Labs: No results for input(s): PROCALCITON, LATICACIDVEN in the last 168 hours.  Recent Results (from the past 240 hour(s))  Culture, blood (routine x 2)     Status: None (Preliminary result)   Collection Time: 03/05/20  2:37 PM   Specimen: BLOOD LEFT HAND  Result Value Ref Range Status   Specimen Description BLOOD LEFT HAND  Final   Special Requests   Final    BOTTLES DRAWN AEROBIC ONLY Blood Culture results may not be optimal due to an inadequate volume of blood received in culture bottles   Culture   Final    NO GROWTH 2 DAYS Performed at [facility], [place] [address] [place], [place] [address]    Report Status PENDING  Incomplete  Culture, blood (routine x 2)     Status: None (Preliminary result)   Collection Time: 03/05/20  2:37 PM   Specimen: BLOOD LEFT HAND  Result Value Ref Range Status   Specimen Description BLOOD LEFT HAND  Final   Special Requests   Final    BOTTLES DRAWN AEROBIC ONLY Blood Culture results may not be optimal due to an inadequate volume of blood received in culture bottles   Culture   Final    NO GROWTH 2 DAYS Performed at [facility], [place] [address], [place],  [address]    Report Status PENDING  Incomplete  Respiratory Panel by RT PCR (Flu A&B, Covid) - Nasopharyngeal Swab     Status: None   Collection Time: 03/05/20 10:16 PM   Specimen: Nasopharyngeal Swab  Result Value Ref Range Status   SARS Coronavirus 2 by RT PCR NEGATIVE NEGATIVE Final    Comment: (NOTE) SARS-CoV-2 target nucleic acids are NOT DETECTED.  The SARS-CoV-2 RNA is generally detectable in upper respiratoy specimens during the acute phase of infection. The lowest concentration of SARS-CoV-2 viral copies this assay can  detect is 131 copies/mL. A negative result does not preclude SARS-Cov-2 infection and should not be used as the sole basis for treatment or  other patient management decisions. A negative result may occur with  improper specimen collection/handling, submission of specimen other than nasopharyngeal swab, presence of viral mutation(s) within the areas targeted by this assay, and inadequate number of viral copies (<131 copies/mL). A negative result must be combined with clinical observations, patient history, and epidemiological information. The expected result is Negative.  Fact Sheet for Patients:  PinkCheek.be  Fact Sheet for Healthcare Providers:  GravelBags.it  This test is no t yet approved or cleared by the [place] FDA and  has been authorized for detection and/or diagnosis of SARS-CoV-2 by FDA under an Emergency Use Authorization (EUA). This EUA will remain  in effect (meaning this test can be used) for the duration of the COVID-19 declaration under Section 564(b)(1) of the Act, 21 U.S.C. section 360bbb-3(b)(1), unless the authorization is terminated or revoked sooner.     Influenza A by PCR NEGATIVE NEGATIVE Final   Influenza B by PCR NEGATIVE NEGATIVE Final    Comment: (NOTE) The Xpert Xpress SARS-CoV-2/FLU/RSV assay is intended as an aid in  the diagnosis of influenza from Nasopharyngeal swab specimens and  should not be used as a sole basis for treatment. Nasal washings and  aspirates are unacceptable for Xpert Xpress SARS-CoV-2/FLU/RSV  testing.  Fact Sheet for Patients: PinkCheek.be  Fact Sheet for Healthcare Providers: GravelBags.it  This test is not yet approved or cleared by the [place] FDA and  has been authorized for detection and/or diagnosis of SARS-CoV-2 by  FDA under an Emergency Use Authorization (EUA). This EUA will remain  in  effect (meaning this test can be used) for the duration of the  Covid-19 declaration under Section 564(b)(1) of the Act, 21  U.S.C. section 360bbb-3(b)(1), unless the authorization is  terminated or revoked. Performed at [facility], [place] [address], [place], [place] [address]          Radiology Studies: No results found.      Scheduled Meds: . amitriptyline  100 mg Oral QHS  . Chlorhexidine Gluconate Cloth  6 each Topical Daily  . clopidogrel  75 mg Oral Daily  . collagenase   Topical Daily  . fluticasone furoate-vilanterol  1 puff Inhalation Daily  . furosemide  60 mg Intravenous Q12H  . gabapentin  400 mg Oral TID  . heparin injection (subcutaneous)  5,000 Units Subcutaneous Q8H  . insulin aspart  0-15 Units Subcutaneous TID WC  . insulin aspart  0-5 Units Subcutaneous QHS  . insulin glargine  20 Units Subcutaneous Daily  . lipase/protease/amylase  72,000 Units Oral TID WC  . metoprolol succinate  50 mg Oral Daily  . multivitamin with minerals  1 tablet Oral Daily  . nicotine  7 mg Transdermal Daily  . pantoprazole  40 mg Oral Daily  . QUEtiapine  25 mg Oral QHS   Continuous Infusions: . ceFEPime (MAXIPIME) IV 2 g (03/07/20 0547)  . lactated ringers 10 mL/hr at 03/04/20 0756     LOS: 13 days    Time spent: 35 mins.More than 50% of that time was spent in counseling and/or coordination of care.      Shelly Coss, MD [facility] P11/10/2019, 11:44 AM

## 2020-03-07 NOTE — Progress Notes (Signed)
Patient was able to sleep for approximately 4 hours throughout the night as patient gets very restless during sleep. Sleep apnea noted and patient wakes up gasping for air and in a state of panic wanting to get OOB because feels like she can't breathe. RT was in to see patient and spoke with patient regarding use of CPAP. Patient refused CPAP despite teaching. Ativan was not very effective, patient was slightly drowsy, but still very restless. Oxygen saturation from low 80's to 100% depending on the activity. Patient back and forth to the bed and to the chair with moderate assist.

## 2020-03-07 NOTE — Progress Notes (Signed)
Pt refused CPAP tonight told her let the nurse know if she changes her mind.

## 2020-03-07 NOTE — Progress Notes (Signed)
   I attempted to see the patient today but she was highly agitated arguing with the nursing tech.  I will attempt revisit prior to her discharge to set her up with follow-up in our office for wound check.  Brina Umeda C. Donzetta Matters, MD [facility] Office: [phone] Pager: [phone]

## 2020-03-07 NOTE — TOC Transition Note (Signed)
Transition of Care [facility]) - CM/SW Discharge Note   Patient Details  Name: Morgan Beasley MRN: [id number] Date of Birth: 1960-07-13  Transition of Care [facility]) CM/SW Contact:  Coralee Pesa, [place] Phone Number: 03/07/2020, 8:52 AM   Clinical Narrative:    Rm # [id number] Nurse to call report to [phone]   Final next level of care: [place] Barriers to Discharge: No Barriers Identified   Patient Goals and CMS Choice Patient states their goals for this hospitalization and ongoing recovery are:: I want to keep my foot CMS Medicare.gov Compare Post Acute Care list provided to:: Patient Choice offered to / list presented to : Patient  Discharge Placement              Patient chooses bed at: [facility] Patient to be transferred to facility by: [place] Name of family member notified: [name] [phone] Patient and family notified of of transfer: 03/07/20  Discharge Plan and Services     Post Acute Care Choice: [place]                               Social Determinants of Health (SDOH) Interventions     Readmission Risk Interventions Readmission Risk Prevention Plan 10/21/2019  Transportation Screening Complete  Medication Review ([place]) Complete  PCP or Specialist appointment within 3-5 days of discharge Complete  HRI or [place] Complete  SW Recovery Care/Counseling Consult Complete  [place] Not Applicable  Some recent data might be hidden

## 2020-03-07 NOTE — Progress Notes (Signed)
Repeat of behavior this morning.  Very paranoid and wanting to get back to bed but did not call RN. Chair alarm went off and RN went in to assist patient but she was very agitated and again did not want any staff in the room to touch her.  Stating she was going to "bust your lips" if we came to close to her. Was again able to give 5 mg of haldol and were able to get patient back in bed.

## 2020-03-08 ENCOUNTER — Encounter (HOSPITAL_COMMUNITY): Payer: Self-pay | Admitting: Internal Medicine

## 2020-03-08 DIAGNOSIS — I5022 Chronic systolic (congestive) heart failure: Secondary | ICD-10-CM | POA: Diagnosis not present

## 2020-03-08 DIAGNOSIS — A419 Sepsis, unspecified organism: Secondary | ICD-10-CM | POA: Diagnosis not present

## 2020-03-08 DIAGNOSIS — N179 Acute kidney failure, unspecified: Secondary | ICD-10-CM | POA: Diagnosis not present

## 2020-03-08 DIAGNOSIS — L97529 Non-pressure chronic ulcer of other part of left foot with unspecified severity: Secondary | ICD-10-CM | POA: Diagnosis not present

## 2020-03-08 LAB — GLUCOSE, CAPILLARY
Glucose-Capillary: 171 mg/dL — ABNORMAL HIGH (ref 70–99)
Glucose-Capillary: 176 mg/dL — ABNORMAL HIGH (ref 70–99)
Glucose-Capillary: 234 mg/dL — ABNORMAL HIGH (ref 70–99)
Glucose-Capillary: 321 mg/dL — ABNORMAL HIGH (ref 70–99)

## 2020-03-08 LAB — BASIC METABOLIC PANEL
Anion gap: 9 (ref 5–15)
BUN: 22 mg/dL — ABNORMAL HIGH (ref 6–20)
CO2: 26 mmol/L (ref 22–32)
Calcium: 8.4 mg/dL — ABNORMAL LOW (ref 8.9–10.3)
Chloride: 104 mmol/L (ref 98–111)
Creatinine, Ser: 1.37 mg/dL — ABNORMAL HIGH (ref 0.44–1.00)
GFR, Estimated: 45 mL/min — ABNORMAL LOW (ref >60)
Glucose, Bld: 236 mg/dL — ABNORMAL HIGH (ref 70–99)
Potassium: 3.8 mmol/L (ref 3.5–5.1)
Sodium: 139 mmol/L (ref 135–145)

## 2020-03-08 LAB — CBC WITH DIFFERENTIAL/PLATELET
Abs Immature Granulocytes: 0.03 10*3/uL (ref 0.00–0.07)
Basophils Absolute: 0 10*3/uL (ref 0.0–0.1)
Basophils Relative: 0 %
Eosinophils Absolute: 0.2 10*3/uL (ref 0.0–0.5)
Eosinophils Relative: 2 %
HCT: 34.6 % — ABNORMAL LOW (ref 36.0–46.0)
Hemoglobin: 10.3 g/dL — ABNORMAL LOW (ref 12.0–15.0)
Immature Granulocytes: 0 %
Lymphocytes Relative: 21 %
Lymphs Abs: 1.8 10*3/uL (ref 0.7–4.0)
MCH: 24.5 pg — ABNORMAL LOW (ref 26.0–34.0)
MCHC: 29.8 g/dL — ABNORMAL LOW (ref 30.0–36.0)
MCV: 82.4 fL (ref 80.0–100.0)
Monocytes Absolute: 0.8 10*3/uL (ref 0.1–1.0)
Monocytes Relative: 10 %
Neutro Abs: 5.8 10*3/uL (ref 1.7–7.7)
Neutrophils Relative %: 67 %
Platelets: 361 10*3/uL (ref 150–400)
RBC: 4.2 MIL/uL (ref 3.87–5.11)
RDW: 19.7 % — ABNORMAL HIGH (ref 11.5–15.5)
WBC: 8.7 10*3/uL (ref 4.0–10.5)
nRBC: 0.8 % — ABNORMAL HIGH (ref 0.0–0.2)

## 2020-03-08 MED ORDER — INSULIN ASPART 100 UNIT/ML ~~LOC~~ SOLN
2.0000 [IU] | Freq: Three times a day (TID) | SUBCUTANEOUS | Status: DC
  Administered 2020-03-08 – 2020-03-18 (×27): 2 [IU] via SUBCUTANEOUS

## 2020-03-08 NOTE — Progress Notes (Signed)
  Speech Language Pathology Treatment:    Patient Details Name: Morgan Beasley MRN: [id number] DOB: 12/16/1960 Today's Date: 03/08/2020 Time: 1207-1217 SLP Time Calculation (min) (ACUTE ONLY): 10 min  Assessment / Plan / Recommendation Clinical Impression  Cognitive and dysarthria treatment needing periodic verbal cues to remain alert- received Haldol yesterday afternoon. Her cognitive status has improved from last week. She is oriented to reason for admission, min prompt to recall stroke event. Stated she was attempting to obtain disability but therapist unable to comprehend entire utterance due to her dysarthria. She continued to fall asleep and session ended. Therapist will focus on her dysarthria to improve independence.    HPI HPI: Pt is a 59 y.o. female with past medical history of prior substance abuse (THC, cocaine),  obesity, poorly controlled diabetes mellitus, CAD, systolic/diastolic CHFongoing tobacco abuse, stage II CKD, COPD not on home oxygen, OSA on nightly BiPAP, and hypertension plus PVD/PAD status post angioplasty and stent placement in the past and left femoral to below-knee popliteal bypass 09/2019 with nonhealing left foot ulcer who presented the ED with c/o left leg and foot pain but was a poor historian at the time.  CT head was done for encephalopathy and revealed a subacute right occipital infarct.  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      SLP Plan  Continue with current plan of care       Recommendations                   Plan: Continue with current plan of care       [place], Morgan Beasley 03/08/2020, 12:25 PM

## 2020-03-08 NOTE — Progress Notes (Signed)
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 She was found to have sepsis secondary to Serratia bacteremia from left lower extremity cellulitis with complicated chronic nonhealing ulcer. Hospital course remarkable for persistent lactic acidosis concerning for critical lower extremity vascular ischemia. Vascular surgery consulted and she was transferred to [facility]. During this hospitalization, she developed acute nonhemorrhagic stroke, also developed AKI and shock liver due to hypotension. Neurology following for stroke.Underwnt TEE on 11.4.21. Hospital course remarkable for persistent encephalopathy but now the mental status has improved.  TEE showed pulmonary valve vegetation.  Plan to continue cefepime till 03/21/2020.    Assessment & Plan:   Principal Problem:   Septic shock ([place]) Active Problems:   Uncontrolled diabetes mellitus with circulatory complication, with long-term current use of insulin (HCC)   COPD (chronic obstructive pulmonary disease) (HCC)   PVD (peripheral vascular disease) (HCC)   Ischemia of left lower extremity   Chronic systolic CHF (congestive heart failure) (HCC)   Obesity (BMI 30-39.9)   Cerebral embolism with cerebral infarction   Septic shock/gram-negative  bacteremia: In the setting of  bilateral lower extremity peripheral artery disease with chronic left lower extremity nonhealing ulcer diabetic patient. Hospital course remarkable for shock, AKI. She was treated with IV fluids. Sepsis physiology has improved. Continue current antibiotics and wound care.ID also consulted for finding of possible pulmonary valve vegetation.  Planning to continue cefepime through 03/21/2020.  Acute nonhemorrhagic stroke: Hospital course remarkable for encephalopathy secondary to acute right occipital infarct. She developed mild left arm weakness. Neurology following. Stroke work-up completeds. She has multiple risk factors including uncontrolled diabetes with hemoglobin A1c of 15, tobacco use, severe peripheral artery disease, elevated LDL. Plan to resume a statin after improvement in the liver function. On Plavix currently. Neurology following. TEE showed ejection fraction of 40%, no PFO or  cardiac source of emboli but probable pulmonary valve vegetation.  Also showed catheter thrombus. PT/OT evaluation done and recommended skilled nursing facility.  Possible pulmonary valve vegetation/catheter related vegetation versus thrombus: Uncommon scenario of finding of possible pulmonary vegetation in the setting of gram-negative bacteremia. ID was following . No need to start anticoagulation for catheter related thrombus. No need to remove central line.  Continue cefepime till 03/21/2020.  Acute encephalopathy: Patient is intermittently agitated and confused but mental status has  improved . This could be most likely associated with the stroke. Continue to monitor mental status.Conitnue sedatives for severe agitation.Also started on seroquel  Peripheral artery disease/nonhealing lower extremity ulcer: Vascular surgery following. She is status post left femoropopliteal bypass on 10/19/2019. On Plavix and statin at home.  Vascular surgery ordered ABI/lower extremity arterial duplex  for outpatient follow up.  Chronic combined systolic/diastolic congestive heart failure: Echo done few months ago showed ejection fraction of 40 to 40% with global hypokinesis. Elevated BNP. She received fluids during this hospitalization for septic shock.  She  still looks volume overloaded.  She takes 40 mg of Lasix at home daily.  She looks volume overloaded today.  We are continuing Lasix 60 mg IV BID. will monitor input and output.  Elevated liver enzymes: Most likely from shock liver from hypotension. Right upper quadrant ultrasound showed nonspecific changes. She was seen by general surgery, no plan for intervention. Acute hepatitis panel negative. Right upper quadrant liver Doppler stable. Questionable early NASH on right upper quadrant ultrasound.Liver enzymes improving  Electrolyte abnormalities: Continue to monitor and supplement.  Diabetes type 2/uncontrolled hyperglycemia: Hemoglobin A1c of 15. Continue current insulin regimen. Monitor blood sugars.  Anemia of chronic disease: Currently hemoglobin is stable. Continue to monitor.  AKI on CKD stage IIIa: Baseline creatinine 1.4. Hospital course remarkable for AKI secondary to contrast nephropathy, dehydration, hypotension. Currently kidney function at baseline.  Hyperlipidemia: On statin and Zetia. Will resume statin when liver function improves.  COPD/tobacco abuse: Counseled for smoking cessation. Currently stable in terms of COPD.  Obesity with OSA: BMI of 32. Uses BiPAP at night at home.  Hypertension: Hospital course remarkable for hypotension. Beta-blockers on hold. Monitor blood pressure.  Reactive mediastinal/hilar/right supraclavicular adenopathy: Incidental finding during this hospitalization. Outpatient biopsy recommended  Severe anxiety: Patient has anxious spells with tachypnea but at times remains comfortable.  We resumed home medications amitriptyline and gabapentin at low-dose.  Continue lorazepam as  needed.  Debility/deconditioning: PT/OT recommended SN facility on discharge..      Nutrition Problem: Increased nutrient needs Etiology: wound healing      DVT prophylaxis:Heparin [place] Code Status: Full Family Communication: Discussed with daughter at bedside on 03/08/2020. Status is: Inpatient  Remains inpatient appropriate because:Inpatient level of care appropriate due to severity of illness   Dispo: The patient is from: Home              Anticipated d/c is to: SNF               Anticipated d/c date ZO:[id number]              Patient currently is not medically stable for discharge     Consultants: Vascular surgery, Neurology   Procedures:TEE  Antimicrobials:  Anti-infectives (From admission, onward)   Start     Dose/Rate Route Frequency Ordered Stop   03/07/20 0000  ceFEPime (MAXIPIME) IVPB        2 g Intravenous Every 12 hours 03/07/20 0819 03/23/20 2359   03/02/20 1800  ceFEPIme (MAXIPIME) 2 g in sodium chloride 0.9 % 100 mL IVPB        2 g 200 mL/hr over 30 Minutes Intravenous Every 12 hours 03/02/20 0853     02/28/20 0546  ceFEPIme (MAXIPIME) 2 g in sodium chloride 0.9 % 100 mL IVPB  Status:  Discontinued        2 g 200 mL/hr over 30 Minutes Intravenous Every 24 hours 02/27/20 1041 03/02/20 0853   02/25/20 1400  metroNIDAZOLE (FLAGYL) tablet 500 mg  Status:  Discontinued        500 mg Oral Every 8 hours 02/25/20 1025 02/29/20 0911   02/24/20 1800  ceFEPIme (MAXIPIME) 2 g in sodium chloride 0.9 % 100 mL IVPB  Status:  Discontinued        2 g 200 mL/hr over 30 Minutes Intravenous Every 12 hours 02/24/20 0732 02/27/20 1041   02/24/20 0600  ceFEPIme (MAXIPIME) 2 g in sodium chloride 0.9 % 100 mL IVPB  Status:  Discontinued  2 g 200 mL/hr over 30 Minutes Intravenous Every 8 hours 02/24/20 0440 02/24/20 0732   02/23/20 1015  vancomycin (VANCOREADY) IVPB 1750 mg/350 mL        1,750 mg 175 mL/hr over 120 Minutes Intravenous  Once 02/23/20 1007 02/23/20 1347    02/23/20 1000  cefTRIAXone (ROCEPHIN) 2 g in sodium chloride 0.9 % 100 mL IVPB  Status:  Discontinued        2 g 200 mL/hr over 30 Minutes Intravenous Every 24 hours 02/23/20 0952 02/24/20 0440   02/23/20 1000  metroNIDAZOLE (FLAGYL) IVPB 500 mg  Status:  Discontinued        500 mg 100 mL/hr over 60 Minutes Intravenous Every 8 hours 02/23/20 0952 02/25/20 1025      Subjective:  Patient seen and examined at the bedside this morning.  Hemodynamically stable during my evaluation.  Daughter was at the bedside.  Both daughter and patient were very emotional this morning because of prolonged stay in the hospital.  We discussed about the importance of continuing  IV diuresis until at least for today.  During my evaluation, she looked comfortable and was alert and oriented.  We are hopeful for discharge to skilled nursing facility tomorrow.  Objective: Vitals:   03/07/20 2336 03/08/20 0131 03/08/20 0406 03/08/20 0600  BP: (!) 139/99  108/72   Pulse: 98 90 86   Resp: 20 19 20    Temp: 97.9 F (36.6 C)  97.8 F (36.6 C)   TempSrc: Oral  Axillary   SpO2: 100% 100% 100% 100%  Weight:      Height:        Intake/Output Summary (Last 24 hours) at 03/08/2020 0743 Last data filed at 03/07/2020 2249 Gross per 24 hour  Intake 57.3 ml  Output 800 ml  Net -742.7 ml   Filed Weights   02/23/20 0937 03/04/20 0741  Weight: 90.3 kg 86.2 kg    Examination:   General exam: Comfortable during my evaluation, lying on the bed Respiratory system: Diminished air sounds bilaterally but no wheezes or crackles Cardiovascular system: S1 & S2 heard, RRR. No JVD, murmurs, rubs, gallops or clicks. Gastrointestinal system: Abdomen is nondistended, soft and nontender. No organomegaly or masses felt. Normal bowel sounds heard. Central nervous system: Alert and awake, mild weakness on the left upper extremity. Extremities: 2-3+ bilateral lower extremity edema, no clubbing ,no cyanosis, PICC line on the left upper  extremity Skin: Ulcer on the dorsum of the left foot     Data Reviewed: I have personally reviewed following labs and imaging studies  CBC: Recent Labs  Lab 03/02/20 0431 03/03/20 0606 03/04/20 0537 03/05/20 0923 03/08/20 0650  WBC 5.8 8.0 7.7 6.7 8.7  NEUTROABS 3.7 4.9 4.8 4.4 5.8  HGB 8.1* 10.5* 10.2* 10.1* 10.3*  HCT 26.4* 34.0* 32.7* 33.0* 34.6*  MCV 81.5 78.9* 79.4* 79.7* 82.4  PLT 337 441* 413* 384 809   Basic Metabolic Panel: Recent Labs  Lab 03/02/20 0431 03/02/20 0431 03/03/20 0606 03/04/20 0537 03/05/20 0923 03/07/20 0342 03/08/20 0650  NA 135   < > 136 139 139 138 139  K 3.3*   < > 4.1 3.6 3.8 3.6 3.8  CL 124*   < > 104 104 104 105 104  CO2 18*   < > 25 27 27 26 26   GLUCOSE 104*   < > 144* 126* 182* 208* 236*  BUN 20   < > 18 15 14 17  22*  CREATININE 1.49*   < >  1.49* 1.28* 1.31* 1.35* 1.37*  CALCIUM 6.2*   < > 8.5* 8.5* 8.5* 8.4* 8.4*  MG 1.4*  --  2.0 1.5* 1.8  --   --    < > = values in this interval not displayed.   GFR: Estimated Creatinine Clearance: 49.5 mL/min (A) (by C-G formula based on SCr of 1.37 mg/dL (H)). Liver Function Tests: Recent Labs  Lab 03/02/20 0431 03/03/20 0606 03/04/20 0537 03/05/20 0923  AST 121* 91* 65* 52*  ALT 152* 176* 138* 111*  ALKPHOS 249* 332* 292* 275*  BILITOT 0.6 0.5 0.7 0.5  PROT 5.5* 6.2* 6.3* 6.5  ALBUMIN 1.1* 1.7* 1.7* 1.8*   No results for input(s): LIPASE, AMYLASE in the last 168 hours. No results for input(s): AMMONIA in the last 168 hours. Coagulation Profile: Recent Labs  Lab 03/02/20 0431 03/03/20 0606 03/04/20 0537  INR 2.1* 1.4* 1.3*   Cardiac Enzymes: No results for input(s): CKTOTAL, CKMB, CKMBINDEX, TROPONINI in the last 168 hours. BNP (last 3 results) Recent Labs    12/19/19 1546 02/09/20 0825  PROBNP 1,148* 1,185*   HbA1C: No results for input(s): HGBA1C in the last 72 hours. CBG: Recent Labs  Lab 03/06/20 2109 03/07/20 0742 03/07/20 1125 03/07/20 1541  03/07/20 1945  GLUCAP 249* 142* 170* 210* 273*   Lipid Profile: No results for input(s): CHOL, HDL, LDLCALC, TRIG, CHOLHDL, LDLDIRECT in the last 72 hours. Thyroid Function Tests: No results for input(s): TSH, T4TOTAL, FREET4, T3FREE, THYROIDAB in the last 72 hours. Anemia Panel: No results for input(s): VITAMINB12, FOLATE, FERRITIN, TIBC, IRON, RETICCTPCT in the last 72 hours. Sepsis Labs: No results for input(s): PROCALCITON, LATICACIDVEN in the last 168 hours.  Recent Results (from the past 240 hour(s))  Culture, blood (routine x 2)     Status: None (Preliminary result)   Collection Time: 03/05/20  2:37 PM   Specimen: BLOOD LEFT HAND  Result Value Ref Range Status   Specimen Description BLOOD LEFT HAND  Final   Special Requests   Final    BOTTLES DRAWN AEROBIC ONLY Blood Culture results may not be optimal due to an inadequate volume of blood received in culture bottles   Culture   Final    NO GROWTH 2 DAYS Performed at [facility], [place] [address], [place], [place] [address]    Report Status PENDING  Incomplete  Culture, blood (routine x 2)     Status: None (Preliminary result)   Collection Time: 03/05/20  2:37 PM   Specimen: BLOOD LEFT HAND  Result Value Ref Range Status   Specimen Description BLOOD LEFT HAND  Final   Special Requests   Final    BOTTLES DRAWN AEROBIC ONLY Blood Culture results may not be optimal due to an inadequate volume of blood received in culture bottles   Culture   Final    NO GROWTH 2 DAYS Performed at [facility], [place] [address], [place], [place] [address]    Report Status PENDING  Incomplete  Respiratory Panel by RT PCR (Flu A&B, Covid) - Nasopharyngeal Swab     Status: None   Collection Time: 03/05/20 10:16 PM   Specimen: Nasopharyngeal Swab  Result Value Ref Range Status   SARS Coronavirus 2 by RT PCR NEGATIVE NEGATIVE Final    Comment: (NOTE) SARS-CoV-2 target nucleic acids are NOT DETECTED.  The SARS-CoV-2 RNA is generally  detectable in upper respiratoy specimens during the acute phase of infection. The lowest concentration of SARS-CoV-2 viral copies this  assay can detect is 131 copies/mL. A negative result does not preclude SARS-Cov-2 infection and should not be used as the sole basis for treatment or other patient management decisions. A negative result may occur with  improper specimen collection/handling, submission of specimen other than nasopharyngeal swab, presence of viral mutation(s) within the areas targeted by this assay, and inadequate number of viral copies (<131 copies/mL). A negative result must be combined with clinical observations, patient history, and epidemiological information. The expected result is Negative.  Fact Sheet for Patients:  PinkCheek.be  Fact Sheet for Healthcare Providers:  GravelBags.it  This test is no t yet approved or cleared by the [place] FDA and  has been authorized for detection and/or diagnosis of SARS-CoV-2 by FDA under an Emergency Use Authorization (EUA). This EUA will remain  in effect (meaning this test can be used) for the duration of the COVID-19 declaration under Section 564(b)(1) of the Act, 21 U.S.C. section 360bbb-3(b)(1), unless the authorization is terminated or revoked sooner.     Influenza A by PCR NEGATIVE NEGATIVE Final   Influenza B by PCR NEGATIVE NEGATIVE Final    Comment: (NOTE) The Xpert Xpress SARS-CoV-2/FLU/RSV assay is intended as an aid in  the diagnosis of influenza from Nasopharyngeal swab specimens and  should not be used as a sole basis for treatment. Nasal washings and  aspirates are unacceptable for Xpert Xpress SARS-CoV-2/FLU/RSV  testing.  Fact Sheet for Patients: PinkCheek.be  Fact Sheet for Healthcare Providers: GravelBags.it  This test is not yet approved or cleared by the [place] FDA and   has been authorized for detection and/or diagnosis of SARS-CoV-2 by  FDA under an Emergency Use Authorization (EUA). This EUA will remain  in effect (meaning this test can be used) for the duration of the  Covid-19 declaration under Section 564(b)(1) of the Act, 21  U.S.C. section 360bbb-3(b)(1), unless the authorization is  terminated or revoked. Performed at [name], [place] [address], [place], [place] [address]          Radiology Studies: No results found.      Scheduled Meds: . amitriptyline  100 mg Oral QHS  . Chlorhexidine Gluconate Cloth  6 each Topical Daily  . clopidogrel  75 mg Oral Daily  . collagenase   Topical Daily  . fluticasone furoate-vilanterol  1 puff Inhalation Daily  . furosemide  60 mg Intravenous Q12H  . gabapentin  400 mg Oral TID  . heparin injection (subcutaneous)  5,000 Units Subcutaneous Q8H  . insulin aspart  0-15 Units Subcutaneous TID WC  . insulin aspart  0-5 Units Subcutaneous QHS  . insulin glargine  20 Units Subcutaneous Daily  . lipase/protease/amylase  72,000 Units Oral TID WC  . metoprolol succinate  50 mg Oral Daily  . multivitamin with minerals  1 tablet Oral Daily  . nicotine  7 mg Transdermal Daily  . pantoprazole  40 mg Oral Daily  . QUEtiapine  25 mg Oral QHS   Continuous Infusions: . ceFEPime (MAXIPIME) IV 2 g (03/08/20 0609)  . lactated ringers 10 mL/hr at 03/04/20 0756     LOS: 14 days    Time spent: 35 mins.More than 50% of that time was spent in counseling and/or coordination of care.      Shelly Coss, MD [facility] P11/11/2019, 7:43 AM

## 2020-03-08 NOTE — Progress Notes (Signed)
RT note. Pt. Refusing CPAP at this time. CPAP is in pt. Room. Pt. VS stable on 4L [place]. RT will continue to monitor.

## 2020-03-08 NOTE — Progress Notes (Signed)
Pt Placed on BIPAP (dreamstation) bled 3L O2 she is tolerating it well at this time.

## 2020-03-08 NOTE — Progress Notes (Signed)
Inpatient Diabetes Program Recommendations  AACE/ADA: New Consensus Statement on Inpatient Glycemic Control   Target Ranges:  Prepandial:   less than 140 mg/dL      Peak postprandial:   less than 180 mg/dL (1-2 hours)      Critically ill patients:  140 - 180 mg/dL   Results for Morgan, Beasley (MRN [id number]) as of 03/08/2020 11:18  Ref. Range 03/07/2020 07:42 03/07/2020 11:25 03/07/2020 15:41 03/07/2020 19:45 03/08/2020 07:48  Glucose-Capillary Latest Ref Range: 70 - 99 mg/dL 142 (H) 170 (H) 210 (H) 273 (H) 171 (H)   Review of Glycemic Control  Current orders for Inpatient glycemic control: Lantus 20 units daily, Novolog 0-15 units TID with meals, Novolog 0-5 units QHS  Inpatient Diabetes Program Recommendations:    Insulin: Please consider ordering Novolog 2 units TID with meals for meal coverage if patient eats at least 50% of meals.  Thanks, Barnie Alderman, RN, MSN, CDE Diabetes Coordinator Inpatient Diabetes Program [phone] (Team Pager from 8am to 5pm)

## 2020-03-08 NOTE — TOC Progression Note (Signed)
Transition of Care [facility]) - Progression Note    Patient Details  Name: Morgan Beasley MRN: [id number] Date of Birth: [birth year]  Transition of Care [facility]) CM/SW Contact  [name], LCSW Phone Number: 03/08/2020, 9:54 AM  Clinical Narrative:   Everlene Balls approval info: approved for 5 days, start date 11/8, review on 11/12 with Arist Dagout. Fax clinical to [phone]. Approval #  [id number].      Expected Discharge Plan: [place] Barriers to Discharge: No Barriers Identified  Expected Discharge Plan and Services Expected Discharge Plan: [place] Choice: [place] arrangements for the past 2 months: Single Family Home                                       Social Determinants of Health (SDOH) Interventions    Readmission Risk Interventions Readmission Risk Prevention Plan 10/21/2019  Transportation Screening Complete  Medication Review ([place]) Complete  PCP or Specialist appointment within 3-5 days of discharge Complete  HRI or [place] Complete  SW Recovery Care/Counseling Consult Complete  [place] Not Applicable  Some recent data might be hidden

## 2020-03-09 DIAGNOSIS — R6521 Severe sepsis with septic shock: Secondary | ICD-10-CM | POA: Diagnosis not present

## 2020-03-09 DIAGNOSIS — A419 Sepsis, unspecified organism: Secondary | ICD-10-CM | POA: Diagnosis not present

## 2020-03-09 LAB — BASIC METABOLIC PANEL
Anion gap: 7 (ref 5–15)
BUN: 20 mg/dL (ref 6–20)
CO2: 28 mmol/L (ref 22–32)
Calcium: 8.4 mg/dL — ABNORMAL LOW (ref 8.9–10.3)
Chloride: 103 mmol/L (ref 98–111)
Creatinine, Ser: 1.28 mg/dL — ABNORMAL HIGH (ref 0.44–1.00)
GFR, Estimated: 49 mL/min — ABNORMAL LOW (ref >60)
Glucose, Bld: 191 mg/dL — ABNORMAL HIGH (ref 70–99)
Potassium: 3.7 mmol/L (ref 3.5–5.1)
Sodium: 138 mmol/L (ref 135–145)

## 2020-03-09 LAB — GLUCOSE, CAPILLARY
Glucose-Capillary: 155 mg/dL — ABNORMAL HIGH (ref 70–99)
Glucose-Capillary: 253 mg/dL — ABNORMAL HIGH (ref 70–99)
Glucose-Capillary: 307 mg/dL — ABNORMAL HIGH (ref 70–99)
Glucose-Capillary: 131 mg/dL — ABNORMAL HIGH (ref 70–99)

## 2020-03-09 LAB — RESPIRATORY PANEL BY RT PCR (FLU A&B, COVID)
Influenza A by PCR: NEGATIVE
Influenza B by PCR: NEGATIVE
SARS Coronavirus 2 by RT PCR: NEGATIVE

## 2020-03-09 MED ORDER — HALOPERIDOL LACTATE 5 MG/ML IJ SOLN
5.0000 mg | Freq: Four times a day (QID) | INTRAMUSCULAR | Status: DC | PRN
  Administered 2020-03-09 – 2020-03-15 (×7): 5 mg via INTRAVENOUS
  Filled 2020-03-09 (×8): qty 1

## 2020-03-09 MED ORDER — SODIUM CHLORIDE 0.9 % IV BOLUS
250.0000 mL | Freq: Once | INTRAVENOUS | Status: AC
  Administered 2020-03-09: 250 mL via INTRAVENOUS

## 2020-03-09 MED ORDER — HALOPERIDOL LACTATE 5 MG/ML IJ SOLN: 5.0000 mg | Freq: Four times a day (QID) | INTRAMUSCULAR | Status: DC | PRN

## 2020-03-09 MED ORDER — QUETIAPINE FUMARATE 50 MG PO TABS
50.0000 mg | ORAL_TABLET | Freq: Every day | ORAL | Status: DC
  Administered 2020-03-09 – 2020-03-14 (×6): 50 mg via ORAL
  Filled 2020-03-09 (×6): qty 1

## 2020-03-09 MED ORDER — LORAZEPAM 2 MG/ML IJ SOLN: 2.0000 mg | Freq: Once | INTRAMUSCULAR | Status: AC

## 2020-03-09 MED ORDER — OLANZAPINE 10 MG IM SOLR
5.0000 mg | Freq: Once | INTRAMUSCULAR | Status: DC
  Filled 2020-03-09: qty 10

## 2020-03-09 MED ORDER — FUROSEMIDE 40 MG PO TABS
40.0000 mg | ORAL_TABLET | Freq: Two times a day (BID) | ORAL | Status: DC
  Administered 2020-03-10 – 2020-03-12 (×5): 40 mg via ORAL
  Filled 2020-03-09 (×5): qty 1

## 2020-03-09 MED ORDER — FUROSEMIDE 40 MG PO TABS: 40.0000 mg | ORAL_TABLET | Freq: Two times a day (BID) | ORAL | Status: DC

## 2020-03-09 MED ORDER — LORAZEPAM 2 MG/ML IJ SOLN
INTRAMUSCULAR | Status: AC
  Administered 2020-03-09: 2 mg
  Filled 2020-03-09: qty 1

## 2020-03-09 NOTE — Progress Notes (Signed)
Pt refused meds and labs this morning despite being educated on the importance of them

## 2020-03-09 NOTE — Progress Notes (Signed)
Pt became agitated stating " they are holding me here and taking away my rights"  Pt was verbally abusive to staff. Messaged Dr. And he responded with don't give haldol. This nurse was able to calm the pt down enough to swab the pt for covid. This nurse brought the swab to the lab and returned to the floor. Overhead page stated all nurses to 2w11. This nurse rushed to the room to find the pt screaming and cursing at the staff. Pt was on the [facility] and stood up and told the staff to "kiss her ass" The staff in the room helped to assist the pt back to bed. The pt became aggressive and agitated and stormed towards the door. The staff was able to place the pts walker behind her and sit her down to transfer pt back to bed. Pt continued to scream and swing her arms. Security was called when the pt cocked her arm back with a threat to punch the staff. Pt was given 5mg  IV Haldol with no relief. Md was called again and when dr. Arrived to the floor security was in the room and restraints were placed on the pt at 09:05. Dr. Wyvonne Lenz spoke to the pt to let her know she was going to be d/c'd tomorrow but she must cooperate with staff. This nurse spoke to the pt stating, that I will take off the restraints if she will cooperate and not be verbally abusive to the staff. She agreed and the restraints were removed at 09:37. Pt has been cooperative. Will CTM

## 2020-03-09 NOTE — Progress Notes (Signed)
CSW covering from [facility] ED, received a call from [place] requesting CSW fill out IVC paperwork and fax to pt's RN Station at [facility].  6:50 PM CSW faxed.  Per TOC Leadership, who states that pt's LCSW covering from [facility] ED will take the IVC' paperwork once it is filled out and signed by the MD/provider filled out and will fax it in to the magistrate, receive the IVC paperwork for [employer] to serve and will then call [employer] to arrive and serve the pt the paperwork.  LCSW at [facility] updated by [employer].  CSW will continue to follow for D/C needs.  Alphonse Guild. Aritzel Krusemark  MSW, LCSW, LCAS, CCS Transitions of Care Clinical Social Worker Care Coordination Department Ph: [phone]

## 2020-03-09 NOTE — Progress Notes (Signed)
RT note. Pt. refusing CPAP machine tonight. Pt. On 3L [place] 100%, VS stable. RT will continue to monitor.

## 2020-03-09 NOTE — Progress Notes (Signed)
Pt getting verbally abusive once again. Stating she is leaving AMA. Pt getting dressed in her own clothes and is significantly out of breath.

## 2020-03-09 NOTE — Progress Notes (Addendum)
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 She was found to have sepsis secondary to Serratia bacteremia from left lower extremity cellulitis with complicated chronic nonhealing ulcer. Hospital course remarkable for persistent lactic acidosis concerning for critical lower extremity vascular ischemia. Vascular surgery consulted and she was transferred to [facility] During this hospitalization, she developed acute nonhemorrhagic stroke, also developed AKI and shock liver due to hypotension. Neurology following for stroke.Underwnt TEE on 11.4.21. Hospital course remarkable for persistent encephalopathy but now the mental status has improved but remains intermittently agitated.  TEE showed pulmonary valve vegetation.  Plan to continue cefepime till 03/21/2020.  PT/OT recommended skilled nursing  facility on discharge.  Plan was for discharge today but she was agitated and had to be restrained,so plan for tomorrow if possible.  Assessment & Plan:   Principal Problem:   Septic shock ([place]) Active Problems:   Uncontrolled diabetes mellitus with circulatory complication, with long-term current use of insulin (HCC)   COPD (chronic obstructive pulmonary disease) (HCC)   PVD (peripheral vascular disease)  (HCC)   Ischemia of left lower extremity   Chronic systolic CHF (congestive heart failure) (HCC)   Obesity (BMI 30-39.9)   Cerebral embolism with cerebral infarction   Septic shock/gram-negative bacteremia: In the setting of  bilateral lower extremity peripheral artery disease with chronic left lower extremity nonhealing ulcer diabetic patient. Hospital course remarkable for shock, AKI. She was treated with IV fluids. Sepsis physiology has improved. Continue current antibiotics and wound care.ID also consulted for finding of possible pulmonary valve vegetation.  Planning to continue cefepime through 03/21/2020.  Acute nonhemorrhagic stroke: Hospital course remarkable for encephalopathy secondary to acute right occipital infarct. She developed mild left arm weakness. Neurology following. Stroke work-up completed. She has multiple risk factors including uncontrolled diabetes with hemoglobin A1c of 15, tobacco use, severe peripheral artery disease, elevated LDL. Plan to resume a statin after improvement in the liver function( will check CMP tomorrow). On Plavix currently. Neurology following. TEE showed ejection fraction of 40%, no PFO or  cardiac source of emboli but probable pulmonary valve vegetation.  Also showed catheter thrombus. PT/OT evaluation done and recommended skilled nursing facility.  Possible pulmonary valve vegetation/catheter related vegetation versus thrombus: Uncommon scenario of finding of possible pulmonary vegetation in the setting of gram-negative bacteremia. ID was following . No need to start anticoagulation for catheter related thrombus. No need to remove central line.  Continue cefepime till 03/21/2020.  Acute encephalopathy/agitation/aggressive behavior: Patient is intermittently agitated and confused but mental status has clearly  improved . Behavioral disturbance could be associated with the stroke or underlying psychiatric issues/personality disorders. Continue to monitor  mental status.we will continue Seroquel.  This morning she was alert and oriented. She is also on amitriptyline and gabapentin at home.  Dose of amitriptyline reduced.She has H/O anxiety,depression. I have requested psychiatry consult.  Peripheral artery disease/nonhealing lower  extremity ulcer: Vascular surgery following. She is status post left femoropopliteal bypass on 10/19/2019. On Plavix and statin at home.  Vascular surgery ordered ABI/lower extremity arterial duplex for outpatient follow up.  Chronic combined systolic/diastolic congestive heart failure: Echo done few months ago showed ejection fraction of 40 to 40% with global hypokinesis. Elevated BNP. She received fluids during this hospitalization for septic shock.  She still looks volume overloaded.  She takes 40 mg of Lasix at home daily.  She still  looks volume overloaded today.  She was on Lasix 60 mg IV BID.  Continue Lasix 40 mg twice daily on discharge.  Prolonged QTc: Last EKG showed prolonged QT.Will check repeat EKG  Elevated liver enzymes: Most likely from shock liver from hypotension. Right upper quadrant ultrasound showed nonspecific changes. She was seen by general surgery, no plan for intervention. Acute hepatitis panel negative. Right upper quadrant liver Doppler stable. Questionable early NASH on right upper quadrant ultrasound.Liver enzymes improving,check CMP tomorrow  Diabetes type 2/uncontrolled hyperglycemia: Hemoglobin A1c of 15. Continue current insulin regimen. Monitor blood sugars.  Continue insulin on discharge.  Anemia of chronic disease: Currently hemoglobin is stable. Continue to monitor.  AKI on CKD stage IIIa: Baseline creatinine 1.4. Hospital course remarkable for AKI secondary to contrast nephropathy, dehydration, hypotension. Currently kidney function at baseline.  Hyperlipidemia: On statin and Zetia. Will resume statin when liver function improves.check CMP tomorow  COPD/tobacco abuse: Counseled for  smoking cessation. Currently stable in terms of COPD.  Obesity with OSA: BMI of 32. Uses cpap at night at home.  Hypertension: Hospital course remarkable for hypotension. Beta-blockers on hold. Monitor blood pressure.  Reactive mediastinal/hilar/right supraclavicular adenopathy: Incidental finding during this hospitalization. Outpatient biopsy recommended  Debility/deconditioning: PT/OT recommended SN facility on discharge..      Nutrition Problem: Increased nutrient needs Etiology: wound healing      DVT prophylaxis:Heparin [place] Code Status: Full Family Communication: Discussed with daughter daily Remains inpatient appropriate because:Inpatient level of care appropriate due to severity of illness   Dispo: The patient is from: Home              Anticipated d/c is to: SNF               Anticipated d/c date HK:[id number] Could not discharge today to skilled nursing facility because she had to be restrained.  Hopefully tomorrow     Consultants: Vascular surgery, Neurology   Procedures:TEE  Antimicrobials:  Anti-infectives (From admission, onward)   Start     Dose/Rate Route Frequency Ordered Stop   03/07/20 0000  ceFEPime (MAXIPIME) IVPB        2 g Intravenous Every 12 hours 03/07/20 0819 03/23/20 2359   03/02/20 1800  ceFEPIme (MAXIPIME) 2 g in sodium chloride 0.9 % 100 mL IVPB        2 g 200 mL/hr over 30 Minutes Intravenous Every 12 hours 03/02/20 0853     02/28/20 0546  ceFEPIme (MAXIPIME) 2 g in sodium chloride 0.9 % 100 mL IVPB  Status:  Discontinued        2 g 200 mL/hr over 30 Minutes Intravenous Every 24 hours 02/27/20 1041 03/02/20 0853   02/25/20 1400  metroNIDAZOLE (FLAGYL) tablet 500 mg  Status:  Discontinued        500 mg Oral Every 8 hours 02/25/20 1025 02/29/20 0911   02/24/20 1800  ceFEPIme (MAXIPIME) 2 g in sodium chloride 0.9 % 100 mL IVPB  Status:  Discontinued  2 g 200 mL/hr over 30 Minutes Intravenous Every 12 hours 02/24/20 0732 02/27/20 1041    02/24/20 0600  ceFEPIme (MAXIPIME) 2 g in sodium chloride 0.9 % 100 mL IVPB  Status:  Discontinued        2 g 200 mL/hr over 30 Minutes Intravenous Every 8 hours 02/24/20 0440 02/24/20 0732   02/23/20 1015  vancomycin (VANCOREADY) IVPB 1750 mg/350 mL        1,750 mg 175 mL/hr over 120 Minutes Intravenous  Once 02/23/20 1007 02/23/20 1347   02/23/20 1000  cefTRIAXone (ROCEPHIN) 2 g in sodium chloride 0.9 % 100 mL IVPB  Status:  Discontinued        2 g 200 mL/hr over 30 Minutes Intravenous Every 24 hours 02/23/20 0952 02/24/20 0440   02/23/20 1000  metroNIDAZOLE (FLAGYL) IVPB 500 mg  Status:  Discontinued        500 mg 100 mL/hr over 60 Minutes Intravenous Every 8 hours 02/23/20 0952 02/25/20 1025      Subjective:  Patient seen and examined at the bedside this morning.  She was very agitated this morning before my evaluation, she had to be restrained.  Security had to be called.  When I entered the room, daughter was at the bedside and she was calm and cooperative again and was asking to remove the restraints.  She was alert and oriented.  Objective: Vitals:   03/08/20 2014 03/09/20 0012 03/09/20 0426 03/09/20 0759  BP: 135/75 132/72    Pulse:   91   Resp:      Temp: (!) 97.2 F (36.2 C) 98.2 F (36.8 C) (!) 97.3 F (36.3 C) 97.7 F (36.5 C)  TempSrc: Axillary Oral Axillary   SpO2: 100% 100% 100%   Weight:      Height:        Intake/Output Summary (Last 24 hours) at 03/09/2020 1332 Last data filed at 03/08/2020 2015 Gross per 24 hour  Intake 480 ml  Output 400 ml  Net 80 ml   Filed Weights   02/23/20 0937 03/04/20 0741  Weight: 90.3 kg 86.2 kg    Examination:  General exam: Comfortable during my evaluation, on restraints, lying in bed, obese Respiratory system: Bilateral diminished air sounds but no wheezes or crackles   Cardiovascular system: S1 & S2 heard, RRR. No JVD, murmurs, rubs, gallops or clicks. Gastrointestinal system: Abdomen is nondistended, soft and  nontender. No organomegaly or masses felt. Normal bowel sounds heard. Central nervous system: Alert and oriented. No focal neurological deficits. Extremities: 1-2+ bilateral lower extremity edema, no clubbing ,no cyanosis, ulcer on the dorsum of the left foot, swelling/lymphdema  of the left upper extremity, PICC line on the left arm Skin:no icterus ,no pallor   Data Reviewed: I have personally reviewed following labs and imaging studies  CBC: Recent Labs  Lab 03/03/20 0606 03/04/20 0537 03/05/20 0923 03/08/20 0650  WBC 8.0 7.7 6.7 8.7  NEUTROABS 4.9 4.8 4.4 5.8  HGB 10.5* 10.2* 10.1* 10.3*  HCT 34.0* 32.7* 33.0* 34.6*  MCV 78.9* 79.4* 79.7* 82.4  PLT 441* 413* 384 784   Basic Metabolic Panel: Recent Labs  Lab 03/03/20 0606 03/03/20 0606 03/04/20 0537 03/05/20 0923 03/07/20 0342 03/08/20 0650 03/09/20 0721  NA 136   < > 139 139 138 139 138  K 4.1   < > 3.6 3.8 3.6 3.8 3.7  CL 104   < > 104 104 105 104 103  CO2 25   < > 27 27  26 26 28   GLUCOSE 144*   < > 126* 182* 208* 236* 191*  BUN 18   < > 15 14 17  22* 20  CREATININE 1.49*   < > 1.28* 1.31* 1.35* 1.37* 1.28*  CALCIUM 8.5*   < > 8.5* 8.5* 8.4* 8.4* 8.4*  MG 2.0  --  1.5* 1.8  --   --   --    < > = values in this interval not displayed.   GFR: Estimated Creatinine Clearance: 53 mL/min (A) (by C-G formula based on SCr of 1.28 mg/dL (H)). Liver Function Tests: Recent Labs  Lab 03/03/20 0606 03/04/20 0537 03/05/20 0923  AST 91* 65* 52*  ALT 176* 138* 111*  ALKPHOS 332* 292* 275*  BILITOT 0.5 0.7 0.5  PROT 6.2* 6.3* 6.5  ALBUMIN 1.7* 1.7* 1.8*   No results for input(s): LIPASE, AMYLASE in the last 168 hours. No results for input(s): AMMONIA in the last 168 hours. Coagulation Profile: Recent Labs  Lab 03/03/20 0606 03/04/20 0537  INR 1.4* 1.3*   Cardiac Enzymes: No results for input(s): CKTOTAL, CKMB, CKMBINDEX, TROPONINI in the last 168 hours. BNP (last 3 results) Recent Labs    12/19/19 1546  02/09/20 0825  PROBNP 1,148* 1,185*   HbA1C: No results for input(s): HGBA1C in the last 72 hours. CBG: Recent Labs  Lab 03/08/20 1159 03/08/20 1705 03/08/20 2140 03/09/20 0753 03/09/20 1244  GLUCAP 176* 234* 321* 155* 253*   Lipid Profile: No results for input(s): CHOL, HDL, LDLCALC, TRIG, CHOLHDL, LDLDIRECT in the last 72 hours. Thyroid Function Tests: No results for input(s): TSH, T4TOTAL, FREET4, T3FREE, THYROIDAB in the last 72 hours. Anemia Panel: No results for input(s): VITAMINB12, FOLATE, FERRITIN, TIBC, IRON, RETICCTPCT in the last 72 hours. Sepsis Labs: No results for input(s): PROCALCITON, LATICACIDVEN in the last 168 hours.  Recent Results (from the past 240 hour(s))  Culture, blood (routine x 2)     Status: None (Preliminary result)   Collection Time: 03/05/20  2:37 PM   Specimen: BLOOD LEFT HAND  Result Value Ref Range Status   Specimen Description BLOOD LEFT HAND  Final   Special Requests   Final    BOTTLES DRAWN AEROBIC ONLY Blood Culture results may not be optimal due to an inadequate volume of blood received in culture bottles   Culture   Final    NO GROWTH 4 DAYS Performed at [facility], [place] [address] [place], [place] [address]    Report Status PENDING  Incomplete  Culture, blood (routine x 2)     Status: None (Preliminary result)   Collection Time: 03/05/20  2:37 PM   Specimen: BLOOD LEFT HAND  Result Value Ref Range Status   Specimen Description BLOOD LEFT HAND  Final   Special Requests   Final    BOTTLES DRAWN AEROBIC ONLY Blood Culture results may not be optimal due to an inadequate volume of blood received in culture bottles   Culture   Final    NO GROWTH 4 DAYS Performed at [facility], [place] [address] [place], [place] [address]    Report Status PENDING  Incomplete  Respiratory Panel by RT PCR (Flu A&B, Covid) - Nasopharyngeal Swab     Status: None   Collection Time: 03/05/20 10:16 PM   Specimen: Nasopharyngeal Swab   Result Value Ref Range Status   SARS Coronavirus 2 by RT PCR NEGATIVE NEGATIVE Final    Comment: (NOTE) SARS-CoV-2 target nucleic acids are NOT DETECTED.  The SARS-CoV-2 RNA is generally detectable in upper respiratoy specimens during the acute phase of infection. The lowest concentration of SARS-CoV-2 viral copies this assay can detect is 131 copies/mL. A negative result does not preclude SARS-Cov-2 infection and should not be used as the sole basis for treatment or other patient management decisions. A negative result may occur with  improper specimen collection/handling, submission of specimen other than nasopharyngeal swab, presence of viral mutation(s) within the areas targeted by this assay, and inadequate number of viral copies (<131 copies/mL). A negative result must be combined with clinical observations, patient history, and epidemiological information. The expected result is Negative.  Fact Sheet for Patients:  PinkCheek.be  Fact Sheet for Healthcare Providers:  GravelBags.it  This test is no t yet approved or cleared by the [place] FDA and  has been authorized for detection and/or diagnosis of SARS-CoV-2 by FDA under an Emergency Use Authorization (EUA). This EUA will remain  in effect (meaning this test can be used) for the duration of the COVID-19 declaration under Section 564(b)(1) of the Act, 21 U.S.C. section 360bbb-3(b)(1), unless the authorization is terminated or revoked sooner.     Influenza A by PCR NEGATIVE NEGATIVE Final   Influenza B by PCR NEGATIVE NEGATIVE Final    Comment: (NOTE) The Xpert Xpress SARS-CoV-2/FLU/RSV assay is intended as an aid in  the diagnosis of influenza from Nasopharyngeal swab specimens and  should not be used as a sole basis for treatment. Nasal washings and  aspirates are unacceptable for Xpert Xpress SARS-CoV-2/FLU/RSV  testing.  Fact Sheet for  Patients: PinkCheek.be  Fact Sheet for Healthcare Providers: GravelBags.it  This test is not yet approved or cleared by the [place] FDA and  has been authorized for detection and/or diagnosis of SARS-CoV-2 by  FDA under an Emergency Use Authorization (EUA). This EUA will remain  in effect (meaning this test can be used) for the duration of the  Covid-19 declaration under Section 564(b)(1) of the Act, 21  U.S.C. section 360bbb-3(b)(1), unless the authorization is  terminated or revoked. Performed at [facility], [place] [address] [place], [place] [address]   Respiratory Panel by RT PCR (Flu A&B, Covid) - Nasopharyngeal Swab     Status: None   Collection Time: 03/09/20  9:10 AM   Specimen: Nasopharyngeal Swab  Result Value Ref Range Status   SARS Coronavirus 2 by RT PCR NEGATIVE NEGATIVE Final    Comment: (NOTE) SARS-CoV-2 target nucleic acids are NOT DETECTED.  The SARS-CoV-2 RNA is generally detectable in upper respiratoy specimens during the acute phase of infection. The lowest concentration of SARS-CoV-2 viral copies this assay can detect is 131 copies/mL. A negative result does not preclude SARS-Cov-2 infection and should not be used as the sole basis for treatment or other patient management decisions. A negative result may occur with  improper specimen collection/handling, submission of specimen other than nasopharyngeal swab, presence of viral mutation(s) within the areas targeted by this assay, and inadequate number of viral copies (<131 copies/mL). A negative result must be combined with clinical observations, patient history, and epidemiological information. The expected result is Negative.  Fact Sheet for Patients:  PinkCheek.be  Fact Sheet for Healthcare Providers:  GravelBags.it  This test is no t yet approved or cleared by the [place] FDA and  has been authorized for detection and/or diagnosis of SARS-CoV-2 by FDA under an Emergency Use Authorization (EUA). This EUA will remain  in effect (meaning this test can be  used) for the duration of the COVID-19 declaration under Section 564(b)(1) of the Act, 21 U.S.C. section 360bbb-3(b)(1), unless the authorization is terminated or revoked sooner.     Influenza A by PCR NEGATIVE NEGATIVE Final   Influenza B by PCR NEGATIVE NEGATIVE Final    Comment: (NOTE) The Xpert Xpress SARS-CoV-2/FLU/RSV assay is intended as an aid in  the diagnosis of influenza from Nasopharyngeal swab specimens and  should not be used as a sole basis for treatment. Nasal washings and  aspirates are unacceptable for Xpert Xpress SARS-CoV-2/FLU/RSV  testing.  Fact Sheet for Patients: PinkCheek.be  Fact Sheet for Healthcare Providers: GravelBags.it  This test is not yet approved or cleared by the [place] FDA and  has been authorized for detection and/or diagnosis of SARS-CoV-2 by  FDA under an Emergency Use Authorization (EUA). This EUA will remain  in effect (meaning this test can be used) for the duration of the  Covid-19 declaration under Section 564(b)(1) of the Act, 21  U.S.C. section 360bbb-3(b)(1), unless the authorization is  terminated or revoked. Performed at [facility], [place] [address] [place] [address] [place], [place] [address]          Radiology Studies: No results found.      Scheduled Meds: . amitriptyline  100 mg Oral QHS  . Chlorhexidine Gluconate Cloth  6 each Topical Daily  . clopidogrel  75 mg Oral Daily  . collagenase   Topical Daily  . fluticasone furoate-vilanterol  1 puff Inhalation Daily  . [START ON 03/10/2020] furosemide  40 mg Oral BID  . gabapentin  400 mg Oral TID  . heparin injection (subcutaneous)  5,000 Units Subcutaneous Q8H  . insulin aspart  0-15 Units Subcutaneous TID WC  . insulin  aspart  0-5 Units Subcutaneous QHS  . insulin aspart  2 Units Subcutaneous TID WC  . insulin glargine  20 Units Subcutaneous Daily  . lipase/protease/amylase  72,000 Units Oral TID WC  . metoprolol succinate  50 mg Oral Daily  . multivitamin with minerals  1 tablet Oral Daily  . nicotine  7 mg Transdermal Daily  . pantoprazole  40 mg Oral Daily  . QUEtiapine  50 mg Oral QHS   Continuous Infusions: . ceFEPime (MAXIPIME) IV 2 g (03/08/20 1758)  . lactated ringers 10 mL/hr at 03/04/20 0756     LOS: 15 days    Time spent: 35 mins.More than 50% of that time was spent in counseling and/or coordination of care.      Shelly Coss, MD [facility] P11/12/2019, 1:32 PM

## 2020-03-09 NOTE — Progress Notes (Signed)
Physical Therapy Treatment Patient Details Name: Morgan Beasley MRN: [id number] DOB: 12-Aug-1960 Today's Date: 03/09/2020    History of Present Illness Pt is a 59 y.o. female admitted 02/23/20 with LLE pain; lab work consistent with sepsis. Also with LLE cellulitis. 66 /31 pt with acute hallucinations and AMS found to have Rt occipital CVA with parietotemporal extension and chronic Left occipital CVAPMH includes CHF, CKD III, COPD, DM, neuropathy, HTN, CAD, OSA, PAD, tobacco use, alcohol dependence, anxiety, gout, breast CA, R foot ulcer, LLE vascular intervention in 2021 (most recently 01/26/20).    PT Comments    Pt was seen for walk on the hallway, with O2 sats 95% baseline and 89% post gait.  Her recovery to 93% was fairly quick, but is becoming challenging to get pt back to safe surface.  Her request to sit on side of bed was accommodated by nursing by leaving the bed alarm on to let her sit there.  Pt is unsafe generally, listens with significant delay to instructions for safety and cannot be redirected easily.  Pt is argumentative, but eventually will be redirected.  Recommend sitter be considered for this pt given her struggle with instruction compliance.  Follow Up Recommendations  SNF     Equipment Recommendations  3in1 (PT)    Recommendations for Other Services       Precautions / Restrictions Precautions Precautions: Fall Precaution Comments: left foot wound Restrictions Weight Bearing Restrictions: No Other Position/Activity Restrictions: monitor top of L foot    Mobility  Bed Mobility Overal bed mobility: Needs Assistance Bed Mobility: Supine to Sit Rolling: Supervision Sidelying to sit: Min guard Supine to sit: Min guard        Transfers Overall transfer level: Needs assistance Equipment used: 1 person hand held assist;4-wheeled walker Transfers: Sit to/from Stand Sit to Stand: Min guard Stand pivot transfers: Min guard       General transfer  comment: requires continual safety cues as well as instruction for limits of walking due to fatigue and her lack of attendance to this  Ambulation/Gait Ambulation/Gait assistance: Min guard;Min assist Gait Distance (Feet): 120 Feet Assistive device: 4-wheeled walker Gait Pattern/deviations: Step-through pattern;Decreased stride length;Trunk flexed;Wide base of support Gait velocity: reduced and variable Gait velocity interpretation: <1.31 ft/sec, indicative of household ambulator General Gait Details: reminders for obstacle clearance and safety of distance limits   Stairs             Wheelchair Mobility    Modified Rankin (Stroke Patients Only)       Balance Overall balance assessment: Needs assistance Sitting-balance support: Feet supported Sitting balance-Leahy Scale: Good Sitting balance - Comments: pt has good balance but makes unsafe choices to lean over and reach around on the floor     Standing balance-Leahy Scale: Poor                              Cognition Arousal/Alertness: Awake/alert Behavior During Therapy: Impulsive Overall Cognitive Status: Impaired/Different from baseline Area of Impairment: Orientation;Attention;Memory;Following commands;Safety/judgement;Awareness;Problem solving                 Orientation Level: Time;Situation Current Attention Level: Selective Memory: Decreased recall of precautions;Decreased short-term memory Following Commands: Follows one step commands inconsistently;Follows one step commands with increased time Safety/Judgement: Decreased awareness of safety;Decreased awareness of deficits Awareness: Intellectual Problem Solving: Slow processing;Requires verbal cues;Requires tactile cues General Comments: poor self regulation of her limits  Exercises      General Comments General comments (skin integrity, edema, etc.): pt is encouraged to use good judgment with safety but will not sit down and  wait for help to stand and maneuver.  Nursing gave permission for pt to sit on side of bed, with bed alarm in place      Pertinent Vitals/Pain Pain Assessment: No/denies pain    Home Living                      Prior Function            PT Goals (current goals can now be found in the care plan section) Acute Rehab PT Goals Patient Stated Goal: walk a longer trip Progress towards PT goals: Progressing toward goals    Frequency    Min 3X/week      PT Plan Current plan remains appropriate    Co-evaluation              AM-PAC PT "6 Clicks" Mobility   Outcome Measure  Help needed turning from your back to your side while in a flat bed without using bedrails?: A Little Help needed moving from lying on your back to sitting on the side of a flat bed without using bedrails?: A Little Help needed moving to and from a bed to a chair (including a wheelchair)?: A Little Help needed standing up from a chair using your arms (e.g., wheelchair or bedside chair)?: A Little Help needed to walk in hospital room?: A Lot Help needed climbing 3-5 steps with a railing? : A Lot 6 Click Score: 16    End of Session Equipment Utilized During Treatment: Gait belt Activity Tolerance: Patient tolerated treatment well;Patient limited by fatigue;Treatment limited secondary to medical complications (Comment) Patient left: in bed;with call bell/phone within reach;with bed alarm set Nurse Communication: Mobility status PT Visit Diagnosis: Other abnormalities of gait and mobility (R26.89);Pain;Muscle weakness (generalized) (M62.81);Unsteadiness on feet (R26.81)     Time: 2355-7322 PT Time Calculation (min) (ACUTE ONLY): 35 min  Charges:                       Ramond Dial 03/09/2020, 5:46 PM  Mee Hives, PT MS Acute Rehab Dept. Number: [facility]

## 2020-03-09 NOTE — Progress Notes (Signed)
CSW went to floor to collect IVC paperwork but discovered that while paperwork was signed, it was not filled out with all of the necessary data. CSW returned paperwork and notified RN Little that patient is not IVC'd.

## 2020-03-09 NOTE — Progress Notes (Signed)
Multiple calls to MD all day in addition to family and security. Patient is very unstable medical, very short of breath, obviously gross edema. Daughter has been called again to attempt to assist with patient. Several nurses and nursing hours has been utilized here today attempting to keep this patient safe. Patient threatening to rip out IV. She is not redirectable. Unsafe situation here. MD gave medication order however patient is not cooperative. Daughter and sister is on her way to help.

## 2020-03-10 DIAGNOSIS — A419 Sepsis, unspecified organism: Secondary | ICD-10-CM | POA: Diagnosis not present

## 2020-03-10 DIAGNOSIS — R6521 Severe sepsis with septic shock: Secondary | ICD-10-CM | POA: Diagnosis not present

## 2020-03-10 LAB — COMPREHENSIVE METABOLIC PANEL
ALT: 61 U/L — ABNORMAL HIGH (ref 0–44)
AST: 38 U/L (ref 15–41)
Albumin: 1.9 g/dL — ABNORMAL LOW (ref 3.5–5.0)
Alkaline Phosphatase: 227 U/L — ABNORMAL HIGH (ref 38–126)
Anion gap: 7 (ref 5–15)
BUN: 17 mg/dL (ref 6–20)
CO2: 30 mmol/L (ref 22–32)
Calcium: 8.4 mg/dL — ABNORMAL LOW (ref 8.9–10.3)
Chloride: 106 mmol/L (ref 98–111)
Creatinine, Ser: 1.14 mg/dL — ABNORMAL HIGH (ref 0.44–1.00)
GFR, Estimated: 56 mL/min — ABNORMAL LOW (ref >60)
Glucose, Bld: 55 mg/dL — ABNORMAL LOW (ref 70–99)
Potassium: 3.3 mmol/L — ABNORMAL LOW (ref 3.5–5.1)
Sodium: 143 mmol/L (ref 135–145)
Total Bilirubin: 0.5 mg/dL (ref 0.3–1.2)
Total Protein: 6.4 g/dL — ABNORMAL LOW (ref 6.5–8.1)

## 2020-03-10 LAB — GLUCOSE, CAPILLARY
Glucose-Capillary: 101 mg/dL — ABNORMAL HIGH (ref 70–99)
Glucose-Capillary: 58 mg/dL — ABNORMAL LOW (ref 70–99)
Glucose-Capillary: 260 mg/dL — ABNORMAL HIGH (ref 70–99)
Glucose-Capillary: 375 mg/dL — ABNORMAL HIGH (ref 70–99)
Glucose-Capillary: 229 mg/dL — ABNORMAL HIGH (ref 70–99)

## 2020-03-10 LAB — CULTURE, BLOOD (ROUTINE X 2)
Culture: NO GROWTH
Culture: NO GROWTH

## 2020-03-10 MED ORDER — INSULIN GLARGINE 100 UNIT/ML ~~LOC~~ SOLN
15.0000 [IU] | Freq: Every day | SUBCUTANEOUS | Status: DC
  Administered 2020-03-10 – 2020-03-11 (×2): 15 [IU] via SUBCUTANEOUS
  Filled 2020-03-10 (×3): qty 0.15

## 2020-03-10 MED ORDER — DEXTROSE 50 % IV SOLN
INTRAVENOUS | Status: AC
  Filled 2020-03-10: qty 50

## 2020-03-10 MED ORDER — POTASSIUM CHLORIDE CRYS ER 20 MEQ PO TBCR
40.0000 meq | EXTENDED_RELEASE_TABLET | Freq: Once | ORAL | Status: AC
  Administered 2020-03-10: 40 meq via ORAL
  Filled 2020-03-10: qty 2

## 2020-03-10 MED ORDER — OXYCODONE HCL 5 MG PO TABS
5.0000 mg | ORAL_TABLET | Freq: Four times a day (QID) | ORAL | Status: DC | PRN
  Administered 2020-03-10 – 2020-03-18 (×20): 5 mg via ORAL
  Filled 2020-03-10 (×20): qty 1

## 2020-03-10 NOTE — Progress Notes (Signed)
Nutrition Follow-up  DOCUMENTATION CODES:   Obesity unspecified  INTERVENTION:   - Continue snacks TID  - Continue MVI with minerals daily  - Add Magic Cup TID with meals, each supplement provides 290 kcal and 9 grams of protein  NUTRITION DIAGNOSIS:   Increased nutrient needs related to wound healing as evidenced by estimated needs.  Ongoing  GOAL:   Patient will meet greater than or equal to 90% of their needs  Progressing  MONITOR:   PO intake, Supplement acceptance, Labs, Weight trends, Skin, I & O's  REASON FOR ASSESSMENT:   Low Braden, NPO/Clear Liquid Diet    ASSESSMENT:   Pt admitted with septic shock 2/2  bacteremia from LLE cellulitis and nonhealing foot ulcer. PMH includes DM, CAD, CHF, HTN, PVD/PAD s/p angioplasty and stent placement.  Pt's hospitalization complicated by an acute infarct for which neurology is following. Additionally, pt had AKI and shock liver due to hypotension. TEE showing pulmonary valve vegetation. Pt to continue on antibiotics until 11/21.  Noted pt has had periods of agitation and has been verbally aggressive. Unable to reach pt via phone call to room.  No new weights since 11/04. Weight down from 199 lbs on 10/25 to 190 lbs on 11/04.  Per RN edema assessment, pt with +2 pitting edema to BUE and +1 pitting edema to BLE.  In addition to snacks between meals, RD will order Magic Cups with meals to aid pt in meeting kcal and protein needs during admission.  Meal Completion: 80% x 1 documented meal on 11/08  Medications reviewed and include: lasix, SSI, novolog 2 units q meals, lantus 15 units daily, Creon TID with meals, MVI with minerals, protonix, IV abx  Labs reviewed: potassium 3.3 CBGs: 58-307 x 24 hours  UOP: 750 ml x 24 hours  Diet Order:   Diet Order            Diet heart healthy/carb modified Room service appropriate? Yes; Fluid consistency: Thin  Diet effective now                 EDUCATION NEEDS:    Education needs have been addressed  Skin:  Skin Assessment: Skin Integrity Issues: Other: non-healing left foot wound  Last BM:  03/09/20  Height:   Ht Readings from Last 1 Encounters:  03/04/20 5\' 6"  (1.676 m)    Weight:   Wt Readings from Last 1 Encounters:  03/04/20 86.2 kg    Ideal Body Weight:  59.09 kg  BMI:  Body mass index is 30.67 kg/m.  Estimated Nutritional Needs:   Kcal:  1950-2150  Protein:  100-120 grams  Fluid:  >/=1.95 L/d    Gaynell Face, MS, RD, LDN Inpatient Clinical Dietitian Please see [facility] for contact information.

## 2020-03-10 NOTE — Consult Note (Signed)
Morgan Beasley Face-to-Face Psychiatry Consult   Reason for Consult: Anxiety, history of anxiety, depression on high dose of amitriptyline at home. Referring Physician: Dr. Tawanna Solo Patient Identification: Morgan Beasley MRN:  [id number] Principal Diagnosis: Septic shock [facility]) Diagnosis:  Principal Problem:   Septic shock ([place]) Active Problems:   Uncontrolled diabetes mellitus with circulatory complication, with long-term current use of insulin (HCC)   COPD (chronic obstructive pulmonary disease) ([place])   PVD (peripheral vascular disease) ([place])   Ischemia of left lower extremity   Chronic systolic CHF (congestive heart failure) ([place])   Obesity (BMI 30-39.9)   Cerebral embolism with cerebral infarction   Total Time spent with patient: 45 minutes  Subjective:   Morgan Beasley is a 59 y.o. female patient admitted with sepsis secondary to cellulitis of the left leg.  On evaluation patient is alert and oriented x2, calm yet grudgingly cooperative, initial unwillingness to participate in evaluation initially.  Throughout the evaluation patient remains guarded, threatening, tangential in her thought process, and irritable.  As noted patient still was very threatening, she stated " I do not have anxiety I have trust issues.  Wouldn't you have trust issues?  I came here on my own accord and I want to go home."  Patient was observed to be sitting upright feeding herself crackers.  Patient denies any previous psychiatric history, however states her primary care provider prescribes her amitriptyline.  She states she is not interested in receiving any psychiatric services, " it is your fault they took those papers out on me.  I do not have anything else to say."  At times during the evaluation patient appeared to have some delusional thinking, ruminating thoughts, and some tangential reality.  At times her sentences will be incomplete, and when angered her speech was incoherent.  When assessing for  orientation patient identify herself and hospital correctly, however stated it was Saturday.  She then stated it was the 11th month " September."  Patient continued to endorse wanting to go home, she was encourage to remain in the hospital as she was not medically stable at this time.  Patient reported' I have a CPAP at home if anything happens. "  She is unable to identify any additional course of action if she develops shortness of breath, or any infection while at home.  She does report having children and then notes " their after my money. "  She denies any suicidal thoughts, homicidal thoughts, and or hallucinations.   HPI:  Morgan Beasley is a 59 y.o. female with past medical history of obesity, poorly controlled diabetes mellitus, CAD, systolic CHF and hypertension plus PVD/PAD status post angioplasty and stent placement in the past who presented the emergency room complaining of left leg and foot pain.  Of note, patient is a poor historian, so difficult to understand.  She was told the ER doctor she was bitten by something, although no wound was noted.  Patient has been followed by vascular surgery for a poorly healing wound and PAD/PVD.  Past Psychiatric History: Anxiety and depression.  Currently taking amitriptyline being prescribed by her primary care provider.  She denies previous psychiatric diagnosis.  She denies previous inpatient admission.  She does not appear to be open to psychiatric services at this time.  Risk to Self:  Denies Risk to Others:  Denies Prior Inpatient Therapy:  Denies Prior Outpatient Therapy:  Denies  Past Medical History:  Past Medical History:  Diagnosis Date  . Alcohol dependence ([place])   .  Anemia   . Anxiety   . Breast cancer ([place])   . Chronic combined systolic and diastolic CHF (congestive heart failure) ([place])   . Cigarette nicotine dependence   . CKD (chronic kidney disease), stage III ([place])   . Colon polyps   . COPD (chronic obstructive  pulmonary disease) ([place])   . Diabetes mellitus without complication ([place])   . Diabetic neuropathy ([place])   . Gout   . Hyperlipemia   . Hypertension   . Insomnia   . Lymphedema   . Marijuana use   . Mild CAD 2016   a. NSTEMI 2016 in context of cocaine abuse, 50% RCA% at that time.  . OSA treated with BiPAP   . PAD (peripheral artery disease) ([place])    a. s/p L SFA stenting 08/2019. b. left fem-to-below-knee-popliteal bypass 09/2019.  Marland Kitchen Pancreatitis    acute on chronic due to ETOH initially.    . Sleep apnea    wears BIPAP  . Ulcer of foot ([place])    right    Past Surgical History:  Procedure Laterality Date  . ABDOMINAL AORTOGRAM W/LOWER EXTREMITY N/A 09/26/2019   Procedure: ABDOMINAL AORTOGRAM W/LOWER EXTREMITY;  Surgeon: Angelia Mould, MD;  Location: [place] [facility];  Service: Cardiovascular;  Laterality: N/A;  . ABDOMINAL AORTOGRAM W/LOWER EXTREMITY Bilateral 12/08/2019   Procedure: ABDOMINAL AORTOGRAM W/LOWER EXTREMITY;  Surgeon: Waynetta Sandy, MD;  Location: [place] [facility];  Service: Cardiovascular;  Laterality: Bilateral;  . ABDOMINAL AORTOGRAM W/LOWER EXTREMITY Left 01/26/2020   Procedure: ABDOMINAL AORTOGRAM W/LOWER EXTREMITY;  Surgeon: Waynetta Sandy, MD;  Location: [place] [facility];  Service: Cardiovascular;  Laterality: Left;  . ABDOMINAL HYSTERECTOMY    . BUBBLE STUDY  03/04/2020   Procedure: BUBBLE STUDY;  Surgeon: Elouise Munroe, MD;  Location: [facility];  Service: Cardiology;;  . [facility]  . FEMORAL-POPLITEAL BYPASS GRAFT Left 10/14/2019   Procedure: BYPASS GRAFT LEFT FEMORAL-POPLITEAL ARTERY USING NONREVERSED SAPHENOUS VEIN;  Surgeon: Waynetta Sandy, MD;  Location: [place];  Service: Vascular;  Laterality: Left;  Marland Kitchen MASTECTOMY Right April 2016  . PERIPHERAL VASCULAR ATHERECTOMY Left 01/26/2020   Procedure: PERIPHERAL VASCULAR ATHERECTOMY;  Surgeon: Waynetta Sandy, MD;  Location: [place] [facility];  Service: Cardiovascular;  Laterality: Left;  PT and AT - [facility]  . PERIPHERAL VASCULAR BALLOON ANGIOPLASTY Left 01/26/2020   Procedure: PERIPHERAL VASCULAR BALLOON ANGIOPLASTY;  Surgeon: Waynetta Sandy, MD;  Location: [place] [facility];  Service: Cardiovascular;  Laterality: Left;  [place]   . PERIPHERAL VASCULAR INTERVENTION Left 09/26/2019   Procedure: PERIPHERAL VASCULAR INTERVENTION;  Surgeon: Angelia Mould, MD;  Location: [place] [facility];  Service: Cardiovascular;  Laterality: Left;  superficial femoral  . TEE WITHOUT CARDIOVERSION N/A 03/04/2020   Procedure: TRANSESOPHAGEAL ECHOCARDIOGRAM (TEE);  Surgeon: Elouise Munroe, MD;  Location: [place];  Service: Cardiology;  Laterality: N/A;   Family History:  Family History  Problem Relation Age of Onset  . Diabetes Other   . Heart disease Other   . Breast cancer Maternal Grandmother   . Breast cancer Paternal Grandmother   . Stroke Son   . Colon cancer Neg Hx   . Esophageal cancer Neg Hx   . Rectal cancer Neg Hx   . Stomach cancer Neg Hx    Family Psychiatric  History: Denies Social History:  Social History   Substance and Sexual Activity  Alcohol Use Not Currently  Comment: Beer - "on the weekends hanging out, maybe 2 or 3"      Social History   Substance and Sexual Activity  Drug Use Not Currently  . Types: Marijuana   Comment: last used 8 2020    Social History   Socioeconomic History  . Marital status: Single    Spouse name: Not on file  . Number of children: Not on file  . Years of education: Not on file  . Highest education level: Not on file  Occupational History  . Not on file  Tobacco Use  . Smoking status: Light Tobacco Smoker    Types: Cigarettes  . Smokeless tobacco: Never Used  . Tobacco comment: 3 cigarettes a day  Vaping Use  . Vaping Use: Never used  Substance and Sexual Activity  . Alcohol use: Not Currently    Comment: Beer - "on the weekends  hanging out, maybe 2 or 3"   . Drug use: Not Currently    Types: Marijuana    Comment: last used 8 2020  . Sexual activity: Not on file  Other Topics Concern  . Not on file  Social History Narrative  . Not on file   Social Determinants of Health   Financial Resource Strain:   . Difficulty of Paying Living Expenses: Not on file  Food Insecurity:   . Worried About Charity fundraiser in the Last Year: Not on file  . Ran Out of Food in the Last Year: Not on file  Transportation Needs:   . Lack of Transportation (Medical): Not on file  . Lack of Transportation (Non-Medical): Not on file  Physical Activity:   . Days of Exercise per Week: Not on file  . Minutes of Exercise per Session: Not on file  Stress:   . Feeling of Stress : Not on file  Social Connections:   . Frequency of Communication with Friends and Family: Not on file  . Frequency of Social Gatherings with Friends and Family: Not on file  . Attends Religious Services: Not on file  . Active Member of Clubs or Organizations: Not on file  . Attends Archivist Meetings: Not on file  . Marital Status: Not on file   Additional Social History:    Allergies:   Allergies  Allergen Reactions  . Tramadol Swelling  . Nsaids Other (See Comments)    Pancreatitis  . Tolmetin Other (See Comments)    Pancreatitis  . Tylenol [Acetaminophen] Other (See Comments)    unknown  . Aspirin Other (See Comments)    "Makes my pancreas act up"     Labs:  Results for orders placed or performed during the hospital encounter of 02/23/20 (from the past 48 hour(s))  Glucose, capillary     Status: Abnormal   Collection Time: 03/08/20  5:05 PM  Result Value Ref Range   Glucose-Capillary 234 (H) 70 - 99 mg/dL    Comment: Glucose reference range applies only to samples taken after fasting for at least 8 hours.  Glucose, capillary     Status: Abnormal   Collection Time: 03/08/20  9:40 PM  Result Value Ref Range   Glucose-Capillary  321 (H) 70 - 99 mg/dL    Comment: Glucose reference range applies only to samples taken after fasting for at least 8 hours.  Basic metabolic panel     Status: Abnormal   Collection Time: 03/09/20  7:21 AM  Result Value Ref Range   Sodium 138 135 - 145  mmol/L   Potassium 3.7 3.5 - 5.1 mmol/L   Chloride 103 98 - 111 mmol/L   CO2 28 22 - 32 mmol/L   Glucose, Bld 191 (H) 70 - 99 mg/dL    Comment: Glucose reference range applies only to samples taken after fasting for at least 8 hours.   BUN 20 6 - 20 mg/dL   Creatinine, Ser 1.28 (H) 0.44 - 1.00 mg/dL   Calcium 8.4 (L) 8.9 - 10.3 mg/dL   GFR, Estimated 49 (L) >60 mL/min    Comment: (NOTE) Calculated using the CKD-EPI Creatinine Equation (2021)    Anion gap 7 5 - 15    Comment: Performed at [facility] [address] [place], [place] [address]  Glucose, capillary     Status: Abnormal   Collection Time: 03/09/20  7:53 AM  Result Value Ref Range   Glucose-Capillary 155 (H) 70 - 99 mg/dL    Comment: Glucose reference range applies only to samples taken after fasting for at least 8 hours.  Respiratory Panel by RT PCR (Flu A&B, Covid) - Nasopharyngeal Swab     Status: None   Collection Time: 03/09/20  9:10 AM   Specimen: Nasopharyngeal Swab  Result Value Ref Range   SARS Coronavirus 2 by RT PCR NEGATIVE NEGATIVE    Comment: (NOTE) SARS-CoV-2 target nucleic acids are NOT DETECTED.  The SARS-CoV-2 RNA is generally detectable in upper respiratoy specimens during the acute phase of infection. The lowest concentration of SARS-CoV-2 viral copies this assay can detect is 131 copies/mL. A negative result does not preclude SARS-Cov-2 infection and should not be used as the sole basis for treatment or other patient management decisions. A negative result may occur with  improper specimen collection/handling, submission of specimen other than nasopharyngeal swab, presence of viral mutation(s) within the areas targeted by this assay, and  inadequate number of viral copies (<131 copies/mL). A negative result must be combined with clinical observations, patient history, and epidemiological information. The expected result is Negative.  Fact Sheet for Patients:  PinkCheek.be  Fact Sheet for Healthcare Providers:  GravelBags.it  This test is no t yet approved or cleared by the [place] FDA and  has been authorized for detection and/or diagnosis of SARS-CoV-2 by FDA under an Emergency Use Authorization (EUA). This EUA will remain  in effect (meaning this test can be used) for the duration of the COVID-19 declaration under Section 564(b)(1) of the Act, 21 U.S.C. section 360bbb-3(b)(1), unless the authorization is terminated or revoked sooner.     Influenza A by PCR NEGATIVE NEGATIVE   Influenza B by PCR NEGATIVE NEGATIVE    Comment: (NOTE) The Xpert Xpress SARS-CoV-2/FLU/RSV assay is intended as an aid in  the diagnosis of influenza from Nasopharyngeal swab specimens and  should not be used as a sole basis for treatment. Nasal washings and  aspirates are unacceptable for Xpert Xpress SARS-CoV-2/FLU/RSV  testing.  Fact Sheet for Patients: PinkCheek.be  Fact Sheet for Healthcare Providers: GravelBags.it  This test is not yet approved or cleared by the [place] FDA and  has been authorized for detection and/or diagnosis of SARS-CoV-2 by  FDA under an Emergency Use Authorization (EUA). This EUA will remain  in effect (meaning this test can be used) for the duration of the  Covid-19 declaration under Section 564(b)(1) of the Act, 21  U.S.C. section 360bbb-3(b)(1), unless the authorization is  terminated or revoked. Performed at [facility], [place] [address] [place], [place]  [address]   Glucose, capillary     Status: Abnormal   Collection Time: 03/09/20 12:44 PM  Result Value Ref Range    Glucose-Capillary 253 (H) 70 - 99 mg/dL    Comment: Glucose reference range applies only to samples taken after fasting for at least 8 hours.  Glucose, capillary     Status: Abnormal   Collection Time: 03/09/20  4:32 PM  Result Value Ref Range   Glucose-Capillary 307 (H) 70 - 99 mg/dL    Comment: Glucose reference range applies only to samples taken after fasting for at least 8 hours.  Glucose, capillary     Status: Abnormal   Collection Time: 03/09/20  9:20 PM  Result Value Ref Range   Glucose-Capillary 131 (H) 70 - 99 mg/dL    Comment: Glucose reference range applies only to samples taken after fasting for at least 8 hours.  Comprehensive metabolic panel     Status: Abnormal   Collection Time: 03/10/20  6:31 AM  Result Value Ref Range   Sodium 143 135 - 145 mmol/L   Potassium 3.3 (L) 3.5 - 5.1 mmol/L   Chloride 106 98 - 111 mmol/L   CO2 30 22 - 32 mmol/L   Glucose, Bld 55 (L) 70 - 99 mg/dL    Comment: Glucose reference range applies only to samples taken after fasting for at least 8 hours.   BUN 17 6 - 20 mg/dL   Creatinine, Ser 1.14 (H) 0.44 - 1.00 mg/dL   Calcium 8.4 (L) 8.9 - 10.3 mg/dL   Total Protein 6.4 (L) 6.5 - 8.1 g/dL   Albumin 1.9 (L) 3.5 - 5.0 g/dL   AST 38 15 - 41 U/L   ALT 61 (H) 0 - 44 U/L   Alkaline Phosphatase 227 (H) 38 - 126 U/L   Total Bilirubin 0.5 0.3 - 1.2 mg/dL   GFR, Estimated 56 (L) >60 mL/min    Comment: (NOTE) Calculated using the CKD-EPI Creatinine Equation (2021)    Anion gap 7 5 - 15    Comment: Performed at [facility], [place] [address] [place], [place] [address]  Glucose, capillary     Status: Abnormal   Collection Time: 03/10/20  7:41 AM  Result Value Ref Range   Glucose-Capillary 58 (L) 70 - 99 mg/dL    Comment: Glucose reference range applies only to samples taken after fasting for at least 8 hours.   Comment 1 Notify RN    Comment 2 Document in Chart   Glucose, capillary     Status: Abnormal   Collection Time: 03/10/20  8:09 AM   Result Value Ref Range   Glucose-Capillary 101 (H) 70 - 99 mg/dL    Comment: Glucose reference range applies only to samples taken after fasting for at least 8 hours.  Glucose, capillary     Status: Abnormal   Collection Time: 03/10/20 11:58 AM  Result Value Ref Range   Glucose-Capillary 260 (H) 70 - 99 mg/dL    Comment: Glucose reference range applies only to samples taken after fasting for at least 8 hours.    Current Facility-Administered Medications  Medication Dose Route Frequency Provider Last Rate Last Admin  . acetaminophen (TYLENOL) tablet 650 mg  650 mg Oral Q6H PRN Annita Brod, MD   650 mg at 03/06/20 2102  . albuterol (VENTOLIN HFA) 108 (90 Base) MCG/ACT inhaler 2 puff  2 puff Inhalation Q6H PRN Shelly Coss, MD   2 puff at 03/10/20 1002  .  amitriptyline (ELAVIL) tablet 100 mg  100 mg Oral QHS Shelly Coss, MD   100 mg at 03/09/20 2252  . ceFEPIme (MAXIPIME) 2 g in sodium chloride 0.9 % 100 mL IVPB  2 g Intravenous Q12H Hammons, Kimberly B, RPH 200 mL/hr at 03/10/20 0620 2 g at 03/10/20 0620  . Chlorhexidine Gluconate Cloth 2 % PADS 6 each  6 each Topical Daily Thurnell Lose, MD   6 each at 03/10/20 [phone]  . clopidogrel (PLAVIX) tablet 75 mg  75 mg Oral Daily Thurnell Lose, MD   75 mg at 03/10/20 0942  . collagenase (SANTYL) ointment   Topical Daily Thurnell Lose, MD   Given at 03/10/20 [phone]  . dextrose (GLUTOSE) 40 % oral gel 37.5 g  1 Tube Oral Once PRN Lala Lund K, MD      . dextrose 50 % solution 0-50 mL  0-50 mL Intravenous PRN Annita Brod, MD   25 mL at 03/10/20 0746  . fluticasone furoate-vilanterol (BREO ELLIPTA) 200-25 MCG/INH 1 puff  1 puff Inhalation Daily Thurnell Lose, MD   1 puff at 03/07/20 0747  . furosemide (LASIX) tablet 40 mg  40 mg Oral BID Shelly Coss, MD   40 mg at 03/10/20 0942  . gabapentin (NEURONTIN) capsule 400 mg  400 mg Oral TID Shelly Coss, MD   400 mg at 03/10/20 0942  . haloperidol lactate (HALDOL)  injection 5 mg  5 mg Intravenous Q6H PRN Shelly Coss, MD   5 mg at 03/09/20 1713  . heparin injection 5,000 Units  5,000 Units Subcutaneous Q8H Thurnell Lose, MD   5,000 Units at 03/10/20 [phone]  . insulin aspart (novoLOG) injection 0-15 Units  0-15 Units Subcutaneous TID WC Shelly Coss, MD   11 Units at 03/09/20 1736  . insulin aspart (novoLOG) injection 0-5 Units  0-5 Units Subcutaneous QHS Shelly Coss, MD   4 Units at 03/08/20 2229  . insulin aspart (novoLOG) injection 2 Units  2 Units Subcutaneous TID WC Shelly Coss, MD   2 Units at 03/09/20 1736  . insulin glargine (LANTUS) injection 15 Units  15 Units Subcutaneous Daily Darliss Cheney, MD   15 Units at 03/10/20 0947  . lactated ringers infusion   Intravenous Continuous Elouise Munroe, MD 10 mL/hr at 03/04/20 0756 Restarted at 03/04/20 0807  . lipase/protease/amylase (CREON) capsule 36,000 Units  36,000 Units Oral BID BM PRN Annita Brod, MD      . lipase/protease/amylase (CREON) capsule 72,000 Units  72,000 Units Oral TID WC Annita Brod, MD   72,000 Units at 03/10/20 0943  . LORazepam (ATIVAN) tablet 0.5 mg  0.5 mg Oral Q6H PRN Shelly Coss, MD   0.5 mg at 03/10/20 1012  . metoprolol succinate (TOPROL-XL) 24 hr tablet 50 mg  50 mg Oral Daily Shelly Coss, MD   50 mg at 03/10/20 0942  . multivitamin with minerals tablet 1 tablet  1 tablet Oral Daily Thurnell Lose, MD   1 tablet at 03/10/20 0942  . naloxone [facility]) injection 0.4 mg  0.4 mg Intravenous PRN Thurnell Lose, MD   0.1 mg at 02/29/20 9233  . nicotine (NICODERM CQ - dosed in mg/24 hr) patch 7 mg  7 mg Transdermal Daily Chotiner, Yevonne Aline, MD   7 mg at 03/10/20 0945  . ondansetron (ZOFRAN) injection 4 mg  4 mg Intravenous Q6H PRN Annita Brod, MD   4 mg at 02/25/20 1343  . pantoprazole (PROTONIX)  EC tablet 40 mg  40 mg Oral Daily Thurnell Lose, MD   40 mg at 03/10/20 0942  . polyethylene glycol (MIRALAX / GLYCOLAX) packet 17 g   17 g Oral Daily PRN Annita Brod, MD      . QUEtiapine (SEROQUEL) tablet 50 mg  50 mg Oral QHS Shelly Coss, MD   50 mg at 03/09/20 2253    Musculoskeletal: Strength & Muscle Tone: within normal limits Gait & Station: normal Patient leans: Right  Psychiatric Specialty Exam: Physical Exam Constitutional:      Appearance: Normal appearance.     Comments: Appeared to have increased work of breathing, nasal cannula was off, however she is satting at 95% on room air.  Neurological:     General: No focal deficit present.     Mental Status: She is alert. Mental status is at baseline.  Psychiatric:     Comments: Irritable     Review of Systems  Blood pressure 126/66, pulse 90, temperature (!) 97.3 F (36.3 C), temperature source Axillary, resp. rate 18, height 5\' 6"  (1.676 m), weight 86.2 kg, SpO2 100 %.Body mass index is 30.67 kg/m.  General Appearance: Disheveled and Guarded  Eye Contact:  Fair  Speech:  Garbled and difficult to understand  Volume:  Increased  Mood:  Angry, Anxious and Irritable  Affect:  Labile and Full Range  Thought Process:  Irrelevant, Linear and Descriptions of Associations: Tangential  Orientation:  Other:  Alert and oriented x2  Thought Content:  Illogical, Delusions, Paranoid Ideation, Rumination and Tangential  Suicidal Thoughts:  No  Homicidal Thoughts:  No  Memory:  Immediate;   Fair Recent;   Fair  Judgement:  Poor  Insight:  Lacking  Psychomotor Activity:  Normal  Concentration:  Concentration: Fair and Attention Span: Fair  Recall:  [employer] of Knowledge:  Fair  Language:  Fair  Akathisia:  No  Handed:  Right  AIMS (if indicated):     Assets:  Communication Skills Desire for Improvement Financial Resources/Insurance Leisure Time Physical Health Social Support  ADL's:  Intact  Cognition:  WNL  Sleep:        Treatment Plan Summary: Plan Patient with increased mood lability, tangential thought process, and worsening  anxiety.  Last EKG on file shows QTC of 560.  At this time patient would not benefit from psychotropic medication.  Will repeat EKG.  We will continue to encourage verbal de-escalation, safety sitter, and therapeutic techniques to assist patient with calming down.  In the event patient continues to be labile, will recommend starting low-dose mood stabilizer Trileptal 150 mg p.o. twice daily.  In the interim may benefit from low-dose benzodiazepine to help with agitation.  Although patient is easily reoriented and redirectable during evaluation.  Did discuss with patient the importance of controlling her temper and not abusing nursing staff as this is what prevented her from discharging home.  She verbalizes understanding.  Disposition: No evidence of imminent risk to self or others at present.   Patient does not meet criteria for psychiatric inpatient admission. Supportive therapy provided about ongoing stressors.  Suella Broad, FNP 03/10/2020 12:19 PM

## 2020-03-10 NOTE — Progress Notes (Signed)
  Speech Language Pathology Treatment: Cognitive-Linquistic (dysarthria)  Patient Details Name: Morgan Beasley MRN: [id number] DOB: Jul 28, 1960 Today's Date: 03/10/2020 Time: 0813-8871 SLP Time Calculation (min) (ACUTE ONLY): 9 min  Assessment / Plan / Recommendation Clinical Impression  Pt met swallow goals last week and treatment focused on dysarthria. In working more with pt, it appears her speech disturbance is related more to dialectal differences and psych factors than articulatory weakness. Her intensity is adequate, she does have decreased pauses between words. She was not able to recall strategies for improved intelligibility. Majority of the time she is able to make her needs known intelligibly. At this time it does not appear she would benefit from continued therapy.    HPI HPI: Pt is a 59 y.o. female with past medical history of prior substance abuse (THC, cocaine),  obesity, poorly controlled diabetes mellitus, CAD, systolic/diastolic CHFongoing tobacco abuse, stage II CKD, COPD not on home oxygen, OSA on nightly BiPAP, and hypertension plus PVD/PAD status post angioplasty and stent placement in the past and left femoral to below-knee popliteal bypass 09/2019 with nonhealing left foot ulcer who presented the ED with c/o left leg and foot pain but was a poor historian at the time.  CT head was done for encephalopathy and revealed a subacute right occipital infarct.  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      SLP Plan  All goals met;Discharge SLP treatment due to (comment)       Recommendations                   Plan: All goals met;Discharge SLP treatment due to (comment)                       Houston Siren 03/10/2020, 3:43 PM  Orbie Pyo Colvin Caroli.Ed Risk analyst [phone] Office [phone]

## 2020-03-10 NOTE — Progress Notes (Signed)
Occupational Therapy Treatment Patient Details Name: Morgan Beasley MRN: [id number] DOB: [birth year] Today's Date: 03/10/2020    History of present illness Pt is a 59 y.o. female admitted 02/23/20 with LLE pain; lab work consistent with sepsis. Also with LLE cellulitis. 33 /31 pt with acute hallucinations and AMS found to have Rt occipital CVA with parietotemporal extension and chronic Left occipital CVAPMH includes CHF, CKD III, COPD, DM, neuropathy, HTN, CAD, OSA, PAD, tobacco use, alcohol dependence, anxiety, gout, breast CA, R foot ulcer, LLE vascular intervention in 2021 (most recently 01/26/20).   OT comments  Pt progressing slowly towards acute OT goals. Session limited by agitation. Did complete BSC to EOB transfer with min A +1 as OT entering the room. Refused further mobility/OOB activity. Worked on attention to ADL task in seated position. D/c plan remains appropriate.    Follow Up Recommendations  SNF;Supervision/Assistance - 24 hour    Equipment Recommendations  Other (comment) (TBD next venue)    Recommendations for Other Services      Precautions / Restrictions Precautions Precautions: Fall Precaution Comments: left foot wound Restrictions Weight Bearing Restrictions: No Other Position/Activity Restrictions: monitor top of L foot       Mobility Bed Mobility               General bed mobility comments: sitting EOB. now has a Actuary present  Transfers Overall transfer level: Needs assistance Equipment used: 1 person hand held assist;4-wheeled walker Transfers: Sit to/from Stand;Stand Pivot Transfers Sit to Stand: Min guard;Min assist Stand pivot transfers: Min assist;Min guard       General transfer comment: BSC to EOB. assist to steady aand for safety    Balance Overall balance assessment: Needs assistance Sitting-balance support: Feet supported Sitting balance-Leahy Scale: Good Sitting balance - Comments: pt has good balance but makes unsafe  choices to lean over and reach around on the floor   Standing balance support: Single extremity supported;Bilateral upper extremity supported;During functional activity Standing balance-Leahy Scale: Poor Standing balance comment: reliant on UE support in standing                           ADL either performed or assessed with clinical judgement   ADL Overall ADL's : Needs assistance/impaired                         Toilet Transfer: Minimal assistance;Stand-pivot Toilet Transfer Details (indicate cue type and reason): NT assisting pt from [facility] to EOB upon OT arrival.            General ADL Comments: Pt completed SPT BSC>EOB as Ot arriving. Bulk of session was EOB and focused on cognition and attention level to engage in basic ADL activity. Sitting EOB througout session.      Vision       Perception     Praxis      Cognition Arousal/Alertness: Awake/alert Behavior During Therapy: Agitated;Impulsive;Anxious Overall Cognitive Status: No family/caregiver present to determine baseline cognitive functioning Area of Impairment: Orientation;Attention;Memory;Following commands;Safety/judgement;Awareness;Problem solving                 Orientation Level: Place;Time;Situation Current Attention Level: Sustained Memory: Decreased recall of precautions;Decreased short-term memory Following Commands: Follows one step commands inconsistently;Follows one step commands with increased time Safety/Judgement: Decreased awareness of safety;Decreased awareness of deficits Awareness: Intellectual Problem Solving: Slow processing;Requires verbal cues;Requires tactile cues General Comments: verbally agitated/combative. Unable(unwilling?) to state location, upset and  verbalized anger at feeling confined to room. Offered to walk with pt out in to hallway, pt declined. Poor insight into deficits and situation        Exercises     Shoulder Instructions       General  Comments      Pertinent Vitals/ Pain       Pain Assessment: Faces Faces Pain Scale: Hurts little more Pain Location: L foot Pain Descriptors / Indicators: Aching;Throbbing Pain Intervention(s): Monitored during session  Home Living                                          Prior Functioning/Environment              Frequency  Min 2X/week        Progress Toward Goals  OT Goals(current goals can now be found in the care plan section)  Progress towards OT goals: Progressing toward goals  Acute Rehab OT Goals Patient Stated Goal: walk a longer trip OT Goal Formulation: With patient Time For Goal Achievement: 03/24/20 Potential to Achieve Goals: Fair ADL Goals Pt Will Perform Grooming: with supervision;sitting;standing Pt Will Perform Upper Body Bathing: sitting;with supervision;with set-up Pt Will Transfer to Toilet: with min guard assist;stand pivot transfer;bedside commode;ambulating Additional ADL Goal #1: Pt will attend to and complete 2 step ADL task with minimal cueing for redirection/task completion. Additional ADL Goal #2: Pt will complete bed mobility with min assist as precursor to ADLs.  Plan Discharge plan remains appropriate;Frequency remains appropriate    Co-evaluation                 AM-PAC OT "6 Clicks" Daily Activity     Outcome Measure   Help from another person eating meals?: A Little Help from another person taking care of personal grooming?: A Little Help from another person toileting, which includes using toliet, bedpan, or urinal?: A Lot Help from another person bathing (including washing, rinsing, drying)?: A Lot Help from another person to put on and taking off regular upper body clothing?: A Little Help from another person to put on and taking off regular lower body clothing?: A Lot 6 Click Score: 15    End of Session    OT Visit Diagnosis: Other abnormalities of gait and mobility (R26.89);Muscle weakness  (generalized) (M62.81);Pain;Other symptoms and signs involving cognitive function Pain - Right/Left: Left Pain - part of body: Leg;Ankle and joints of foot   Activity Tolerance Treatment limited secondary to agitation   Patient Left in bed;with call bell/phone within reach;with nursing/sitter in room;Other (comment);with bed alarm set (sitting EOB)   Nurse Communication          Time: 3267-1245 OT Time Calculation (min): 12 min  Charges: OT General Charges $OT Visit: 1 Visit OT Treatments $Self Care/Home Management : 8-22 mins  Tyrone Schimke, OT Acute Rehabilitation Services Pager: [phone] Office: [phone]    Hortencia Pilar 03/10/2020, 2:30 PM

## 2020-03-10 NOTE — Progress Notes (Addendum)
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 She was found to have sepsis secondary to Serratia bacteremia from left lower extremity cellulitis with complicated chronic nonhealing ulcer. Hospital course remarkable for persistent lactic acidosis concerning for critical lower extremity vascular ischemia. Vascular surgery consulted and she was transferred to [facility]. During this hospitalization, she developed acute nonhemorrhagic stroke, also developed AKI and shock liver due to hypotension. Neurology following for stroke.Underwnt TEE on 11.4.21. Hospital course remarkable for persistent encephalopathy but now the mental status has improved but remains intermittently agitated.  TEE showed pulmonary valve vegetation.  Plan to continue cefepime till 03/21/2020.  PT/OT recommended skilled nursing  facility on discharge.  Plan was for discharge on 03/09/2020 but she was agitated and had to be restrained.  Assessment & Plan:   Principal Problem:   Septic shock ([place]) Active Problems:   Uncontrolled diabetes mellitus with circulatory complication, with long-term current use of insulin (HCC)   COPD (chronic obstructive pulmonary disease) (HCC)   PVD (peripheral vascular disease) (HCC)   Ischemia of left lower  extremity   Chronic systolic CHF (congestive heart failure) (HCC)   Obesity (BMI 30-39.9)   Cerebral embolism with cerebral infarction   Septic shock/gram-negative bacteremia: In the setting of  bilateral lower extremity peripheral artery disease with chronic left lower extremity nonhealing ulcer diabetic patient. Hospital course remarkable for shock, AKI.  Blood culture from 02/23/2020 + however from 03/05/2020 are negative to date.  She was treated with IV fluids. Sepsis physiology has resolved. Continue current antibiotics and wound care. ID also consulted for finding of possible pulmonary valve vegetation.  They recommended to continue cefepime through 03/21/2020.  Hypokalemia: We will replace.  Acute nonhemorrhagic stroke: Hospital course remarkable for encephalopathy secondary to acute right occipital infarct. She developed mild left arm weakness. Neurology consulted.  Stroke work-up completed. On Plavix currently. TEE showed ejection fraction of 40%, no PFO or  cardiac source of emboli but probable pulmonary valve vegetation.  Also showed catheter thrombus. PT/OT evaluation done and recommended skilled nursing facility.-Once LFTs improve a little more.  They are improving already.  Possible pulmonary valve vegetation/catheter related vegetation versus thrombus: Uncommon scenario of finding of possible pulmonary vegetation in the setting of gram-negative bacteremia. ID was following .  Per them, no need to start anticoagulation for catheter related thrombus. No need to remove central line.  Continue cefepime till 03/21/2020.  Acute encephalopathy/agitation/aggressive behavior/delirium: Patient is intermittently agitated and confused but mental status has clearly  improved . Behavioral disturbance could be associated with the stroke or underlying psychiatric issues/personality disorders.  Psychiatry was consulted and they recommended as needed Ativan and adding Trileptal if personality changes  become an issue.  Patient this morning was alert and oriented however she was agitated.  She was still in restraints.  IVC requested.  Papers filled.  She was started on Seroquel yesterday  which I will continue.  Delirium precautions.  I personally educated the sitter in the daughter in the room to keep the room later during the day and dark at night so patient can rest peacefully.  Peripheral artery disease/nonhealing lower extremity ulcer: Vascular surgery following. She is status post left femoropopliteal bypass on 10/19/2019. On Plavix and statin at home.  Vascular surgery ordered ABI/lower extremity arterial duplex for outpatient follow up.  Chronic combined systolic/diastolic congestive heart failure: Echo done few months ago showed ejection fraction of 40 to 40% with global hypokinesis. Elevated BNP. She received fluids during this hospitalization for septic shock.  She still looks volume overloaded.  She takes 40 mg of Lasix at home daily.  Continue Lasix 40 mg twice daily for now.  Prolonged QTc: Last EKG showed prolonged QT.Will check repeat EKG  Elevated liver enzymes: Most likely from shock liver from hypotension. Right upper quadrant ultrasound showed nonspecific changes. She was seen by general surgery, no plan for intervention. Acute hepatitis panel negative. Right upper quadrant liver Doppler stable. Questionable early NASH on right upper quadrant ultrasound.Liver enzymes improving,check CMP tomorrow.  Diabetes type 2/uncontrolled hyperglycemia: Hemoglobin A1c of 15.  Hypoglycemic this morning.  Reduce Lantus to 15 units and continue SSI.  Anemia of chronic disease: Currently hemoglobin is stable. Continue to monitor.  AKI on CKD stage IIIa: Baseline creatinine 1.4. Hospital course remarkable for AKI secondary to contrast nephropathy, dehydration, hypotension. Currently kidney function at baseline.  Hyperlipidemia: On statin and Zetia. Will resume statin when liver function  improves.check CMP tomorow  COPD/tobacco abuse: Counseled for smoking cessation. Currently stable in terms of COPD.  Obesity with OSA: BMI of 32. Uses cpap at night at home.  Hypertension: Hospital course remarkable for hypotension. Beta-blockers on hold. Monitor blood pressure.  Blood pressure has improved today.  Will monitor another day and if stable, resume beta-blocker.  Reactive mediastinal/hilar/right supraclavicular adenopathy: Incidental finding during this hospitalization. Outpatient biopsy recommended  Debility/deconditioning: PT/OT recommended SN facility on discharge..  Nutrition Problem: Increased nutrient needs Etiology: wound healing      DVT prophylaxis:Heparin [place] Code Status: Full Family Communication: Discussed with 1 daughter who was present at bedside and then as well as daughter via video call.  Answered several questions. Remains inpatient appropriate because:Inpatient level of care appropriate due to severity of illness   Dispo: The patient is from: Home              Anticipated d/c is to: SNF               Anticipated d/c date is: 1 to 2 days.  Consultants: Vascular surgery, Neurology   Procedures:TEE  Antimicrobials:  Anti-infectives (From admission, onward)   Start     Dose/Rate Route Frequency Ordered Stop   03/07/20 0000  ceFEPime (MAXIPIME) IVPB        2 g Intravenous Every 12 hours 03/07/20 0819 03/23/20 2359   03/02/20 1800  ceFEPIme (MAXIPIME) 2 g in sodium chloride 0.9 % 100 mL IVPB        2 g 200 mL/hr over 30 Minutes Intravenous Every 12 hours 03/02/20 0853     02/28/20 0546  ceFEPIme (MAXIPIME) 2 g in sodium chloride 0.9 % 100 mL IVPB  Status:  Discontinued        2 g 200 mL/hr over 30 Minutes Intravenous Every 24 hours 02/27/20 1041 03/02/20 0853   02/25/20 1400  metroNIDAZOLE (FLAGYL) tablet 500 mg  Status:  Discontinued  500 mg Oral Every 8 hours 02/25/20 1025 02/29/20 0911   02/24/20 1800  ceFEPIme (MAXIPIME) 2 g in sodium  chloride 0.9 % 100 mL IVPB  Status:  Discontinued        2 g 200 mL/hr over 30 Minutes Intravenous Every 12 hours 02/24/20 0732 02/27/20 1041   02/24/20 0600  ceFEPIme (MAXIPIME) 2 g in sodium chloride 0.9 % 100 mL IVPB  Status:  Discontinued        2 g 200 mL/hr over 30 Minutes Intravenous Every 8 hours 02/24/20 0440 02/24/20 0732   02/23/20 1015  vancomycin (VANCOREADY) IVPB 1750 mg/350 mL        1,750 mg 175 mL/hr over 120 Minutes Intravenous  Once 02/23/20 1007 02/23/20 1347   02/23/20 1000  cefTRIAXone (ROCEPHIN) 2 g in sodium chloride 0.9 % 100 mL IVPB  Status:  Discontinued        2 g 200 mL/hr over 30 Minutes Intravenous Every 24 hours 02/23/20 0952 02/24/20 0440   02/23/20 1000  metroNIDAZOLE (FLAGYL) IVPB 500 mg  Status:  Discontinued        500 mg 100 mL/hr over 60 Minutes Intravenous Every 8 hours 02/23/20 0952 02/25/20 1025      Subjective: Seen and examined.  Patient was restrained.  Patient was alert and oriented but agitated.  Daughter at the bedside.  Patient denied any complaint.  She was " upset" due to having PICC line in the left upper extremity.  Objective: Vitals:   03/09/20 1742 03/09/20 2021 03/10/20 0021 03/10/20 0408  BP: 97/79 (!) 79/66 124/71 126/66  Pulse: 97   90  Resp: 16 16  18   Temp: (!) 97.1 F (36.2 C) 98.3 F (36.8 C) 97.6 F (36.4 C) (!) 97.3 F (36.3 C)  TempSrc: Axillary Oral Axillary Axillary  SpO2:    100%  Weight:      Height:        Intake/Output Summary (Last 24 hours) at 03/10/2020 1427 Last data filed at 03/10/2020 0022 Gross per 24 hour  Intake --  Output 750 ml  Net -750 ml   Filed Weights   02/23/20 0937 03/04/20 0741  Weight: 90.3 kg 86.2 kg    Examination:  General exam: Appears agitated but comfortable. Respiratory system: Clear to auscultation. Respiratory effort normal. Cardiovascular system: S1 & S2 heard, RRR. No JVD, murmurs, rubs, gallops or clicks. No pedal edema. Gastrointestinal system: Abdomen is  nondistended, soft and nontender. No organomegaly or masses felt. Normal bowel sounds heard. Central nervous system: Alert and oriented. No focal neurological deficits. Extremities: Symmetric 5 x 5 power. Skin: No rashes, lesions or ulcers.  Psychiatry: Judgement and insight appear poor. Mood & affect agitated    Data Reviewed: I have personally reviewed following labs and imaging studies  CBC: Recent Labs  Lab 03/04/20 0537 03/05/20 0923 03/08/20 0650  WBC 7.7 6.7 8.7  NEUTROABS 4.8 4.4 5.8  HGB 10.2* 10.1* 10.3*  HCT 32.7* 33.0* 34.6*  MCV 79.4* 79.7* 82.4  PLT 413* 384 762   Basic Metabolic Panel: Recent Labs  Lab 03/04/20 0537 03/04/20 0537 03/05/20 0923 03/07/20 0342 03/08/20 0650 03/09/20 0721 03/10/20 0631  NA 139   < > 139 138 139 138 143  K 3.6   < > 3.8 3.6 3.8 3.7 3.3*  CL 104   < > 104 105 104 103 106  CO2 27   < > 27 26 26 28 30   GLUCOSE 126*   < > 182*  208* 236* 191* 55*  BUN 15   < > 14 17 22* 20 17  CREATININE 1.28*   < > 1.31* 1.35* 1.37* 1.28* 1.14*  CALCIUM 8.5*   < > 8.5* 8.4* 8.4* 8.4* 8.4*  MG 1.5*  --  1.8  --   --   --   --    < > = values in this interval not displayed.   GFR: Estimated Creatinine Clearance: 59.5 mL/min (A) (by C-G formula based on SCr of 1.14 mg/dL (H)). Liver Function Tests: Recent Labs  Lab 03/04/20 0537 03/05/20 0923 03/10/20 0631  AST 65* 52* 38  ALT 138* 111* 61*  ALKPHOS 292* 275* 227*  BILITOT 0.7 0.5 0.5  PROT 6.3* 6.5 6.4*  ALBUMIN 1.7* 1.8* 1.9*   No results for input(s): LIPASE, AMYLASE in the last 168 hours. No results for input(s): AMMONIA in the last 168 hours. Coagulation Profile: Recent Labs  Lab 03/04/20 0537  INR 1.3*   Cardiac Enzymes: No results for input(s): CKTOTAL, CKMB, CKMBINDEX, TROPONINI in the last 168 hours. BNP (last 3 results) Recent Labs    12/19/19 1546 02/09/20 0825  PROBNP 1,148* 1,185*   HbA1C: No results for input(s): HGBA1C in the last 72 hours. CBG: Recent  Labs  Lab 03/09/20 1632 03/09/20 2120 03/10/20 0741 03/10/20 0809 03/10/20 1158  GLUCAP 307* 131* 58* 101* 260*   Lipid Profile: No results for input(s): CHOL, HDL, LDLCALC, TRIG, CHOLHDL, LDLDIRECT in the last 72 hours. Thyroid Function Tests: No results for input(s): TSH, T4TOTAL, FREET4, T3FREE, THYROIDAB in the last 72 hours. Anemia Panel: No results for input(s): VITAMINB12, FOLATE, FERRITIN, TIBC, IRON, RETICCTPCT in the last 72 hours. Sepsis Labs: No results for input(s): PROCALCITON, LATICACIDVEN in the last 168 hours.  Recent Results (from the past 240 hour(s))  Culture, blood (routine x 2)     Status: None   Collection Time: 03/05/20  2:37 PM   Specimen: BLOOD LEFT HAND  Result Value Ref Range Status   Specimen Description BLOOD LEFT HAND  Final   Special Requests   Final    BOTTLES DRAWN AEROBIC ONLY Blood Culture results may not be optimal due to an inadequate volume of blood received in culture bottles   Culture   Final    NO GROWTH 5 DAYS Performed at [facility], [place] [address] [place], [place] [address]    Report Status 03/10/2020 FINAL  Final  Culture, blood (routine x 2)     Status: None   Collection Time: 03/05/20  2:37 PM   Specimen: BLOOD LEFT HAND  Result Value Ref Range Status   Specimen Description BLOOD LEFT HAND  Final   Special Requests   Final    BOTTLES DRAWN AEROBIC ONLY Blood Culture results may not be optimal due to an inadequate volume of blood received in culture bottles   Culture   Final    NO GROWTH 5 DAYS Performed at [facility], [place] [address] [place], [place] [address]    Report Status 03/10/2020 FINAL  Final  Respiratory Panel by RT PCR (Flu A&B, Covid) - Nasopharyngeal Swab     Status: None   Collection Time: 03/05/20 10:16 PM   Specimen: Nasopharyngeal Swab  Result Value Ref Range Status   SARS Coronavirus 2 by RT PCR NEGATIVE NEGATIVE Final    Comment: (NOTE) SARS-CoV-2 target nucleic acids are NOT  DETECTED.  The SARS-CoV-2 RNA is generally detectable in upper respiratoy specimens during the acute  phase of infection. The lowest concentration of SARS-CoV-2 viral copies this assay can detect is 131 copies/mL. A negative result does not preclude SARS-Cov-2 infection and should not be used as the sole basis for treatment or other patient management decisions. A negative result may occur with  improper specimen collection/handling, submission of specimen other than nasopharyngeal swab, presence of viral mutation(s) within the areas targeted by this assay, and inadequate number of viral copies (<131 copies/mL). A negative result must be combined with clinical observations, patient history, and epidemiological information. The expected result is Negative.  Fact Sheet for Patients:  PinkCheek.be  Fact Sheet for Healthcare Providers:  GravelBags.it  This test is no t yet approved or cleared by the [place] FDA and  has been authorized for detection and/or diagnosis of SARS-CoV-2 by FDA under an Emergency Use Authorization (EUA). This EUA will remain  in effect (meaning this test can be used) for the duration of the COVID-19 declaration under Section 564(b)(1) of the Act, 21 U.S.C. section 360bbb-3(b)(1), unless the authorization is terminated or revoked sooner.     Influenza A by PCR NEGATIVE NEGATIVE Final   Influenza B by PCR NEGATIVE NEGATIVE Final    Comment: (NOTE) The Xpert Xpress SARS-CoV-2/FLU/RSV assay is intended as an aid in  the diagnosis of influenza from Nasopharyngeal swab specimens and  should not be used as a sole basis for treatment. Nasal washings and  aspirates are unacceptable for Xpert Xpress SARS-CoV-2/FLU/RSV  testing.  Fact Sheet for Patients: PinkCheek.be  Fact Sheet for Healthcare Providers: GravelBags.it  This test is not yet  approved or cleared by the [place] FDA and  has been authorized for detection and/or diagnosis of SARS-CoV-2 by  FDA under an Emergency Use Authorization (EUA). This EUA will remain  in effect (meaning this test can be used) for the duration of the  Covid-19 declaration under Section 564(b)(1) of the Act, 21  U.S.C. section 360bbb-3(b)(1), unless the authorization is  terminated or revoked. Performed at [facility], [place] [address], [place], [place] [address]   Respiratory Panel by RT PCR (Flu A&B, Covid) - Nasopharyngeal Swab     Status: None   Collection Time: 03/09/20  9:10 AM   Specimen: Nasopharyngeal Swab  Result Value Ref Range Status   SARS Coronavirus 2 by RT PCR NEGATIVE NEGATIVE Final    Comment: (NOTE) SARS-CoV-2 target nucleic acids are NOT DETECTED.  The SARS-CoV-2 RNA is generally detectable in upper respiratoy specimens during the acute phase of infection. The lowest concentration of SARS-CoV-2 viral copies this assay can detect is 131 copies/mL. A negative result does not preclude SARS-Cov-2 infection and should not be used as the sole basis for treatment or other patient management decisions. A negative result may occur with  improper specimen collection/handling, submission of specimen other than nasopharyngeal swab, presence of viral mutation(s) within the areas targeted by this assay, and inadequate number of viral copies (<131 copies/mL). A negative result must be combined with clinical observations, patient history, and epidemiological information. The expected result is Negative.  Fact Sheet for Patients:  PinkCheek.be  Fact Sheet for Healthcare Providers:  GravelBags.it  This test is no t yet approved or cleared by the [place] FDA and  has been authorized for detection and/or diagnosis of SARS-CoV-2 by FDA under an Emergency Use Authorization (EUA). This EUA will remain  in  effect (meaning this test can be used) for the duration of the COVID-19 declaration under Section 564(b)(1) of the  Act, 21 U.S.C. section 360bbb-3(b)(1), unless the authorization is terminated or revoked sooner.     Influenza A by PCR NEGATIVE NEGATIVE Final   Influenza B by PCR NEGATIVE NEGATIVE Final    Comment: (NOTE) The Xpert Xpress SARS-CoV-2/FLU/RSV assay is intended as an aid in  the diagnosis of influenza from Nasopharyngeal swab specimens and  should not be used as a sole basis for treatment. Nasal washings and  aspirates are unacceptable for Xpert Xpress SARS-CoV-2/FLU/RSV  testing.  Fact Sheet for Patients: PinkCheek.be  Fact Sheet for Healthcare Providers: GravelBags.it  This test is not yet approved or cleared by the [place] FDA and  has been authorized for detection and/or diagnosis of SARS-CoV-2 by  FDA under an Emergency Use Authorization (EUA). This EUA will remain  in effect (meaning this test can be used) for the duration of the  Covid-19 declaration under Section 564(b)(1) of the Act, 21  U.S.C. section 360bbb-3(b)(1), unless the authorization is  terminated or revoked. Performed at [facility], [place] [address] [place], [place] [address]          Radiology Studies: No results found.      Scheduled Meds: . amitriptyline  100 mg Oral QHS  . Chlorhexidine Gluconate Cloth  6 each Topical Daily  . clopidogrel  75 mg Oral Daily  . collagenase   Topical Daily  . fluticasone furoate-vilanterol  1 puff Inhalation Daily  . furosemide  40 mg Oral BID  . gabapentin  400 mg Oral TID  . heparin injection (subcutaneous)  5,000 Units Subcutaneous Q8H  . insulin aspart  0-15 Units Subcutaneous TID WC  . insulin aspart  0-5 Units Subcutaneous QHS  . insulin aspart  2 Units Subcutaneous TID WC  . insulin glargine  15 Units Subcutaneous Daily  . lipase/protease/amylase  72,000 Units Oral  TID WC  . metoprolol succinate  50 mg Oral Daily  . multivitamin with minerals  1 tablet Oral Daily  . nicotine  7 mg Transdermal Daily  . pantoprazole  40 mg Oral Daily  . QUEtiapine  50 mg Oral QHS   Continuous Infusions: . ceFEPime (MAXIPIME) IV 2 g (03/10/20 0620)  . lactated ringers 10 mL/hr at 03/04/20 0756     LOS: 16 days   Time spent: 42 mins.More than 50% of that time was spent in counseling and/or coordination of care.  Darliss Cheney, MD [facility] P11/01/2020, 2:27 PM

## 2020-03-10 NOTE — TOC Progression Note (Signed)
Transition of Care [facility]) - Progression Note    Patient Details  Name: Morgan Beasley MRN: [id number] Date of Birth: Sep 06, 1960  Transition of Care [facility]) CM/SW Contact  [name], LCSW Phone Number: 03/10/2020, 9:11 AM  Clinical Narrative:   [facility], [place].  Based on pt recent behaviors, they cannot accept her for SNF placement.    Expected Discharge Plan: [place] Barriers to Discharge: No Barriers Identified  Expected Discharge Plan and Services Expected Discharge Plan: [place] Choice: [place] arrangements for the past 2 months: Single Family Home                                       Social Determinants of Health (SDOH) Interventions    Readmission Risk Interventions Readmission Risk Prevention Plan 10/21/2019  Transportation Screening Complete  Medication Review ([place]) Complete  PCP or Specialist appointment within 3-5 days of discharge Complete  HRI or [place] Complete  SW Recovery Care/Counseling Consult Complete  [place] Not Applicable  Some recent data might be hidden

## 2020-03-11 ENCOUNTER — Encounter (HOSPITAL_COMMUNITY): Payer: Medicare Other

## 2020-03-11 ENCOUNTER — Inpatient Hospital Stay (HOSPITAL_COMMUNITY): Payer: Medicare Other

## 2020-03-11 ENCOUNTER — Ambulatory Visit: Payer: Medicare Other | Admitting: Internal Medicine

## 2020-03-11 DIAGNOSIS — R6521 Severe sepsis with septic shock: Secondary | ICD-10-CM | POA: Diagnosis not present

## 2020-03-11 DIAGNOSIS — A419 Sepsis, unspecified organism: Secondary | ICD-10-CM | POA: Diagnosis not present

## 2020-03-11 LAB — CBC WITH DIFFERENTIAL/PLATELET
Abs Immature Granulocytes: 0.02 10*3/uL (ref 0.00–0.07)
Basophils Absolute: 0 10*3/uL (ref 0.0–0.1)
Basophils Relative: 0 %
Eosinophils Absolute: 0.2 10*3/uL (ref 0.0–0.5)
Eosinophils Relative: 2 %
HCT: 28.3 % — ABNORMAL LOW (ref 36.0–46.0)
Hemoglobin: 8.5 g/dL — ABNORMAL LOW (ref 12.0–15.0)
Immature Granulocytes: 0 %
Lymphocytes Relative: 27 %
Lymphs Abs: 1.9 10*3/uL (ref 0.7–4.0)
MCH: 24.2 pg — ABNORMAL LOW (ref 26.0–34.0)
MCHC: 30 g/dL (ref 30.0–36.0)
MCV: 80.6 fL (ref 80.0–100.0)
Monocytes Absolute: 0.9 10*3/uL (ref 0.1–1.0)
Monocytes Relative: 13 %
Neutro Abs: 4.1 10*3/uL (ref 1.7–7.7)
Neutrophils Relative %: 58 %
Platelets: 320 10*3/uL (ref 150–400)
RBC: 3.51 MIL/uL — ABNORMAL LOW (ref 3.87–5.11)
RDW: 19.9 % — ABNORMAL HIGH (ref 11.5–15.5)
WBC: 7.1 10*3/uL (ref 4.0–10.5)
nRBC: 0.6 % — ABNORMAL HIGH (ref 0.0–0.2)

## 2020-03-11 LAB — GLUCOSE, CAPILLARY
Glucose-Capillary: 146 mg/dL — ABNORMAL HIGH (ref 70–99)
Glucose-Capillary: 143 mg/dL — ABNORMAL HIGH (ref 70–99)
Glucose-Capillary: 150 mg/dL — ABNORMAL HIGH (ref 70–99)
Glucose-Capillary: 269 mg/dL — ABNORMAL HIGH (ref 70–99)
Glucose-Capillary: 203 mg/dL — ABNORMAL HIGH (ref 70–99)

## 2020-03-11 LAB — COMPREHENSIVE METABOLIC PANEL
ALT: 55 U/L — ABNORMAL HIGH (ref 0–44)
AST: 44 U/L — ABNORMAL HIGH (ref 15–41)
Albumin: 1.8 g/dL — ABNORMAL LOW (ref 3.5–5.0)
Alkaline Phosphatase: 220 U/L — ABNORMAL HIGH (ref 38–126)
Anion gap: 7 (ref 5–15)
BUN: 18 mg/dL (ref 6–20)
CO2: 30 mmol/L (ref 22–32)
Calcium: 8 mg/dL — ABNORMAL LOW (ref 8.9–10.3)
Chloride: 103 mmol/L (ref 98–111)
Creatinine, Ser: 1.17 mg/dL — ABNORMAL HIGH (ref 0.44–1.00)
GFR, Estimated: 54 mL/min — ABNORMAL LOW (ref >60)
Glucose, Bld: 201 mg/dL — ABNORMAL HIGH (ref 70–99)
Potassium: 3.3 mmol/L — ABNORMAL LOW (ref 3.5–5.1)
Sodium: 140 mmol/L (ref 135–145)
Total Bilirubin: 0.5 mg/dL (ref 0.3–1.2)
Total Protein: 5.9 g/dL — ABNORMAL LOW (ref 6.5–8.1)

## 2020-03-11 LAB — MAGNESIUM: Magnesium: 1.2 mg/dL — ABNORMAL LOW (ref 1.7–2.4)

## 2020-03-11 MED ORDER — MAGNESIUM SULFATE 4 GM/100ML IV SOLN
4.0000 g | Freq: Once | INTRAVENOUS | Status: AC
  Administered 2020-03-11: 4 g via INTRAVENOUS
  Filled 2020-03-11: qty 100

## 2020-03-11 MED ORDER — POTASSIUM CHLORIDE CRYS ER 20 MEQ PO TBCR
40.0000 meq | EXTENDED_RELEASE_TABLET | Freq: Once | ORAL | Status: AC
  Administered 2020-03-11: 40 meq via ORAL
  Filled 2020-03-11: qty 2

## 2020-03-11 NOTE — Progress Notes (Signed)
Patient calm and cooperative today safety sitter d/c, patient has also not been restrained since yesterday 03/10/2020 around lunch.  Vanissa Strength, Tivis Ringer, RN

## 2020-03-11 NOTE — TOC Progression Note (Addendum)
Transition of Care [facility]) - Progression Note    Patient Details  Name: Morgan Beasley MRN: [id number] Date of Birth: 1961-03-05  Transition [facility]) CM/SW Contact  [name], LCSW Phone Number: 03/11/2020, 1:11 PM  Clinical Narrative:   CSW spoke with pt daughter [name] about recent behavioral issues, informed her that [facility] has rescinded their bed offer.  [name] understands, does think her mother is ready to leave the hospital but also not understanding that she needs the treatment.  [name] open to other SNF options if one could be taken.  We also discussed possible DC to home with [facility] as a possible option.  [name] had some medical questions and was referred to RN/MD.  CSW spoke to [facility] and [facility]--both decline to accept pt due to the behaviors. CSW LM with [facility]. 1430: spoke with [name], she will review pt and get back to CSW. 1611: message from [name], they can not accept pt. CSW spoke with [name] at [facility], who will review pt chart and see if they can still offer bed. 1400: [name] reports [facility] cannot accept.      Expected Discharge Plan: [place] Barriers to Discharge: No Barriers Identified  Expected Discharge Plan and Services Expected Discharge Plan: [place] Choice: [place] arrangements for the past 2 months: Single Family Home                                       Social Determinants of Health (SDOH) Interventions    Readmission Risk Interventions Readmission Risk Prevention Plan 10/21/2019  Transportation Screening Complete  Medication Review ([place]) Complete  PCP or Specialist appointment within 3-5 days of discharge Complete  HRI or [place] Complete  SW Recovery Care/Counseling Consult Complete  [place] Not Applicable  Some recent data might be  hidden

## 2020-03-11 NOTE — Progress Notes (Addendum)
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 She was found to have sepsis secondary to Serratia bacteremia from left lower extremity cellulitis with complicated chronic nonhealing ulcer. Hospital course remarkable for persistent lactic acidosis concerning for critical lower extremity vascular ischemia. Vascular surgery consulted and she was transferred to [facility]. During this hospitalization, she developed acute nonhemorrhagic stroke, also developed AKI and shock liver due to hypotension. Neurology following for stroke.Underwnt TEE on 11.4.21. Hospital course remarkable for persistent encephalopathy but now the mental status has improved but remains intermittently agitated.  TEE showed pulmonary valve vegetation.  Plan to continue cefepime till 03/21/2020.  PT/OT recommended skilled nursing  facility on discharge.  Plan was for discharge on 03/09/2020 but she was agitated and had to be restrained.  Assessment & Plan:   Principal Problem:   Septic shock ([place]) Active Problems:   Uncontrolled diabetes mellitus with circulatory complication, with long-term current use of insulin (HCC)   COPD (chronic obstructive pulmonary disease) (HCC)   PVD (peripheral vascular disease) (HCC)   Ischemia of left lower  extremity   Chronic systolic CHF (congestive heart failure) (HCC)   Obesity (BMI 30-39.9)   Cerebral embolism with cerebral infarction   Septic shock/gram-negative bacteremia: In the setting of  bilateral lower extremity peripheral artery disease with chronic left lower extremity nonhealing ulcer diabetic patient. Hospital course remarkable for shock, AKI.  Blood culture from 02/23/2020 + however from 03/05/2020 are negative to date.  She was treated with IV fluids. Sepsis physiology has resolved. Continue current antibiotics and wound care. ID also consulted for finding of possible pulmonary valve vegetation.  They recommended to continue cefepime through 03/21/2020.  Hypokalemia: 3.3 again today.  Replaced today.  Recheck in the morning.  Acute nonhemorrhagic stroke: Hospital course remarkable for encephalopathy secondary to acute right occipital infarct. She developed mild left arm weakness. Neurology consulted.  Stroke work-up completed. On Plavix currently. TEE showed ejection fraction of 40%, no PFO or  cardiac source of emboli but probable pulmonary valve vegetation.  Also showed catheter thrombus. PT/OT evaluation done and recommended skilled nursing facility.-Once LFTs improve a little more.  They are stable with not much improvement today.  Possible pulmonary valve vegetation/catheter related vegetation versus thrombus: Uncommon scenario of finding of possible pulmonary vegetation in the setting of gram-negative bacteremia. ID was following .  Per them, no need to start anticoagulation for catheter related thrombus. No need to remove central line.  Continue cefepime till 03/21/2020.  Acute encephalopathy/agitation/aggressive behavior/delirium:  Behavioral disturbance could be associated with the stroke or underlying psychiatric issues/personality disorders.  Psychiatry was consulted and they recommended as needed Ativan and adding Trileptal if personality changes become an issue.  Patient  this morning is doing much better, alert and oriented like she was yesterday but she was also pleasant today.  She was not on restraints today but she had a  Air cabin crew.  I discussed in progression around with the staff today to remove safety sitter and see how she does.  I expect her to improve more tomorrow and possibly medically ready for discharge.  I was informed that she lost the bed at SNF and more referrals are being sent out today after social worker spoke to the daughter.  I also spoke to the daughter and she confirmed that.  Unfortunately, her room was dark in the morning when I entered.  I opened all the windows and advised sitter to keep the room late during the daytime.  Continue delirium precautions and nightly Seroquel for now.  Peripheral artery disease/nonhealing lower extremity ulcer: Vascular surgery following. She is status post left femoropopliteal bypass on 10/19/2019. On Plavix and statin at home.  Vascular surgery ordered ABI/lower extremity arterial duplex for outpatient follow up.  Chronic combined systolic/diastolic congestive heart failure: Echo done few months ago showed ejection fraction of 40 to 40% with global hypokinesis. Elevated BNP. She received fluids during this hospitalization for septic shock.  She still looks volume overloaded.  She takes 40 mg of Lasix at home daily.  Continue Lasix 40 mg twice daily for now.  Prolonged QTc: Last EKG showed prolonged QT.Will check repeat EKG  Elevated liver enzymes: Most likely from shock liver from hypotension. Right upper quadrant ultrasound showed nonspecific changes. She was seen by general surgery, no plan for intervention. Acute hepatitis panel negative. Right upper quadrant liver Doppler stable. Questionable early NASH on right upper quadrant ultrasound.Liver enzymes improving,check CMP tomorrow.  Diabetes type 2/uncontrolled hyperglycemia: Hemoglobin A1c of 15.  Hypoglycemic this morning.  Reduce Lantus to 15 units and  continue SSI.  Anemia of chronic disease: Currently hemoglobin is stable. Continue to monitor.  AKI on CKD stage IIIa: Baseline creatinine 1.4. Hospital course remarkable for AKI secondary to contrast nephropathy, dehydration, hypotension. Currently kidney function at baseline.  Hyperlipidemia: On statin and Zetia. Will resume statin when liver function improves.check CMP tomorow  COPD/tobacco abuse: Counseled for smoking cessation. Currently stable in terms of COPD.  Obesity with OSA: BMI of 32. Uses cpap at night at home.  Hypertension: Hospital course remarkable for hypotension.  Beta-blocker initially on hold but resumed on 03/04/2020.  Blood pressure slightly on the low side.  Monitor closely.  Reactive mediastinal/hilar/right supraclavicular adenopathy: Incidental finding during this hospitalization. Outpatient biopsy recommended  Debility/deconditioning: PT/OT recommended SN facility on discharge..  Left upper extremity swelling: Patient did not complain of pain initially but when I asked, she endorsed having some pain in the left upper extremity, it was swollen compared to the right and had firm consistency.  She has declined on the left side as well, this raises concern for possible blood clot.  I have ordered ultrasound to rule out DVT.  We will place orders to keep left upper extremity elevated to reduce swelling.  Acute hypoxic respiratory failure: Patient has been requiring about 4 L of oxygen.  On examination, she has decreased breath sounds.  This is likely due to atelectasis.  Will obtain chest x-ray.  Hypomagnesemia: 1.2.  Replaced today.  Recheck in the morning.  Nutrition Problem: Increased nutrient needs Etiology: wound healing     DVT prophylaxis:Heparin [place] Code Status: Full Family Communication: None present at bedside.  Discussed with her daughter [name] over the phone. Remains inpatient appropriate because:Inpatient level of care appropriate due to severity of  illness   Dispo: The patient is from: Home  Anticipated d/c is to: SNF               Anticipated d/c date is: 1 to 2 days.  Consultants: Vascular surgery, Neurology   Procedures:TEE  Antimicrobials:  Anti-infectives (From admission, onward)   Start     Dose/Rate Route Frequency Ordered Stop   03/07/20 0000  ceFEPime (MAXIPIME) IVPB        2 g Intravenous Every 12 hours 03/07/20 0819 03/23/20 2359   03/02/20 1800  ceFEPIme (MAXIPIME) 2 g in sodium chloride 0.9 % 100 mL IVPB        2 g 200 mL/hr over 30 Minutes Intravenous Every 12 hours 03/02/20 0853     02/28/20 0546  ceFEPIme (MAXIPIME) 2 g in sodium chloride 0.9 % 100 mL IVPB  Status:  Discontinued        2 g 200 mL/hr over 30 Minutes Intravenous Every 24 hours 02/27/20 1041 03/02/20 0853   02/25/20 1400  metroNIDAZOLE (FLAGYL) tablet 500 mg  Status:  Discontinued        500 mg Oral Every 8 hours 02/25/20 1025 02/29/20 0911   02/24/20 1800  ceFEPIme (MAXIPIME) 2 g in sodium chloride 0.9 % 100 mL IVPB  Status:  Discontinued        2 g 200 mL/hr over 30 Minutes Intravenous Every 12 hours 02/24/20 0732 02/27/20 1041   02/24/20 0600  ceFEPIme (MAXIPIME) 2 g in sodium chloride 0.9 % 100 mL IVPB  Status:  Discontinued        2 g 200 mL/hr over 30 Minutes Intravenous Every 8 hours 02/24/20 0440 02/24/20 0732   02/23/20 1015  vancomycin (VANCOREADY) IVPB 1750 mg/350 mL        1,750 mg 175 mL/hr over 120 Minutes Intravenous  Once 02/23/20 1007 02/23/20 1347   02/23/20 1000  cefTRIAXone (ROCEPHIN) 2 g in sodium chloride 0.9 % 100 mL IVPB  Status:  Discontinued        2 g 200 mL/hr over 30 Minutes Intravenous Every 24 hours 02/23/20 0952 02/24/20 0440   02/23/20 1000  metroNIDAZOLE (FLAGYL) IVPB 500 mg  Status:  Discontinued        500 mg 100 mL/hr over 60 Minutes Intravenous Every 8 hours 02/23/20 0952 02/25/20 1025      Subjective: Seen and examined.  No family member present at bedside.  She was alert and oriented.   She did not have any complaint.  She was pleasant.  Not on restraints.  Objective: Vitals:   03/10/20 2328 03/11/20 0300 03/11/20 0726 03/11/20 1113  BP: 138/84 93/63 105/83 (!) 120/56  Pulse: (!) 111  86 80  Resp: (!) 24 20 (!) 21 20  Temp: 99.5 F (37.5 C) 98.5 F (36.9 C) 98.5 F (36.9 C) 98.5 F (36.9 C)  TempSrc: Axillary Axillary Axillary Axillary  SpO2: 94%  100% 96%  Weight:      Height:        Intake/Output Summary (Last 24 hours) at 03/11/2020 1343 Last data filed at 03/11/2020 0611 Gross per 24 hour  Intake 449 ml  Output 525 ml  Net -76 ml   Filed Weights   02/23/20 0937 03/04/20 0741  Weight: 90.3 kg 86.2 kg    Examination:  General exam: Appears calm and comfortable  Respiratory system: Diminished breath sounds bilaterally. Respiratory effort normal. Cardiovascular system: S1 & S2 heard, RRR. No JVD, murmurs, rubs, gallops or clicks. No pedal edema. Gastrointestinal system: Abdomen is nondistended, soft and nontender. No  organomegaly or masses felt. Normal bowel sounds heard. Central nervous system: Alert and oriented. No focal neurological deficits. Extremities: Symmetric 5 x 5 power.  +2 pitting edema left upper extremity. Skin: No rashes, lesions or ulcers.   Data Reviewed: I have personally reviewed following labs and imaging studies  CBC: Recent Labs  Lab 03/05/20 0923 03/08/20 0650 03/11/20 0240  WBC 6.7 8.7 7.1  NEUTROABS 4.4 5.8 4.1  HGB 10.1* 10.3* 8.5*  HCT 33.0* 34.6* 28.3*  MCV 79.7* 82.4 80.6  PLT 384 361 003   Basic Metabolic Panel: Recent Labs  Lab 03/05/20 0923 03/05/20 0923 03/07/20 0342 03/08/20 0650 03/09/20 0721 03/10/20 0631 03/11/20 0240  NA 139   < > 138 139 138 143 140  K 3.8   < > 3.6 3.8 3.7 3.3* 3.3*  CL 104   < > 105 104 103 106 103  CO2 27   < > 26 26 28 30 30   GLUCOSE 182*   < > 208* 236* 191* 55* 201*  BUN 14   < > 17 22* 20 17 18   CREATININE 1.31*   < > 1.35* 1.37* 1.28* 1.14* 1.17*  CALCIUM 8.5*    < > 8.4* 8.4* 8.4* 8.4* 8.0*  MG 1.8  --   --   --   --   --  1.2*   < > = values in this interval not displayed.   GFR: Estimated Creatinine Clearance: 58 mL/min (A) (by C-G formula based on SCr of 1.17 mg/dL (H)). Liver Function Tests: Recent Labs  Lab 03/05/20 0923 03/10/20 0631 03/11/20 0240  AST 52* 38 44*  ALT 111* 61* 55*  ALKPHOS 275* 227* 220*  BILITOT 0.5 0.5 0.5  PROT 6.5 6.4* 5.9*  ALBUMIN 1.8* 1.9* 1.8*   No results for input(s): LIPASE, AMYLASE in the last 168 hours. No results for input(s): AMMONIA in the last 168 hours. Coagulation Profile: No results for input(s): INR, PROTIME in the last 168 hours. Cardiac Enzymes: No results for input(s): CKTOTAL, CKMB, CKMBINDEX, TROPONINI in the last 168 hours. BNP (last 3 results) Recent Labs    12/19/19 1546 02/09/20 0825  PROBNP 1,148* 1,185*   HbA1C: No results for input(s): HGBA1C in the last 72 hours. CBG: Recent Labs  Lab 03/10/20 1708 03/10/20 2144 03/11/20 0616 03/11/20 0732 03/11/20 1202  GLUCAP 375* 229* 146* 143* 150*   Lipid Profile: No results for input(s): CHOL, HDL, LDLCALC, TRIG, CHOLHDL, LDLDIRECT in the last 72 hours. Thyroid Function Tests: No results for input(s): TSH, T4TOTAL, FREET4, T3FREE, THYROIDAB in the last 72 hours. Anemia Panel: No results for input(s): VITAMINB12, FOLATE, FERRITIN, TIBC, IRON, RETICCTPCT in the last 72 hours. Sepsis Labs: No results for input(s): PROCALCITON, LATICACIDVEN in the last 168 hours.  Recent Results (from the past 240 hour(s))  Culture, blood (routine x 2)     Status: None   Collection Time: 03/05/20  2:37 PM   Specimen: BLOOD LEFT HAND  Result Value Ref Range Status   Specimen Description BLOOD LEFT HAND  Final   Special Requests   Final    BOTTLES DRAWN AEROBIC ONLY Blood Culture results may not be optimal due to an inadequate volume of blood received in culture bottles   Culture   Final    NO GROWTH 5 DAYS Performed at [facility], [place] [address] [place], [place] [address]    Report Status 03/10/2020 FINAL  Final  Culture, blood (routine x 2)  Status: None   Collection Time: 03/05/20  2:37 PM   Specimen: BLOOD LEFT HAND  Result Value Ref Range Status   Specimen Description BLOOD LEFT HAND  Final   Special Requests   Final    BOTTLES DRAWN AEROBIC ONLY Blood Culture results may not be optimal due to an inadequate volume of blood received in culture bottles   Culture   Final    NO GROWTH 5 DAYS Performed at [facility], [place] [address] [place], [place] [address]    Report Status 03/10/2020 FINAL  Final  Respiratory Panel by RT PCR (Flu A&B, Covid) - Nasopharyngeal Swab     Status: None   Collection Time: 03/05/20 10:16 PM   Specimen: Nasopharyngeal Swab  Result Value Ref Range Status   SARS Coronavirus 2 by RT PCR NEGATIVE NEGATIVE Final    Comment: (NOTE) SARS-CoV-2 target nucleic acids are NOT DETECTED.  The SARS-CoV-2 RNA is generally detectable in upper respiratoy specimens during the acute phase of infection. The lowest concentration of SARS-CoV-2 viral copies this assay can detect is 131 copies/mL. A negative result does not preclude SARS-Cov-2 infection and should not be used as the sole basis for treatment or other patient management decisions. A negative result may occur with  improper specimen collection/handling, submission of specimen other than nasopharyngeal swab, presence of viral mutation(s) within the areas targeted by this assay, and inadequate number of viral copies (<131 copies/mL). A negative result must be combined with clinical observations, patient history, and epidemiological information. The expected result is Negative.  Fact Sheet for Patients:  PinkCheek.be  Fact Sheet for Healthcare Providers:  GravelBags.it  This test is no t yet approved or cleared by the [place] FDA and  has been authorized for  detection and/or diagnosis of SARS-CoV-2 by FDA under an Emergency Use Authorization (EUA). This EUA will remain  in effect (meaning this test can be used) for the duration of the COVID-19 declaration under Section 564(b)(1) of the Act, 21 U.S.C. section 360bbb-3(b)(1), unless the authorization is terminated or revoked sooner.     Influenza A by PCR NEGATIVE NEGATIVE Final   Influenza B by PCR NEGATIVE NEGATIVE Final    Comment: (NOTE) The Xpert Xpress SARS-CoV-2/FLU/RSV assay is intended as an aid in  the diagnosis of influenza from Nasopharyngeal swab specimens and  should not be used as a sole basis for treatment. Nasal washings and  aspirates are unacceptable for Xpert Xpress SARS-CoV-2/FLU/RSV  testing.  Fact Sheet for Patients: PinkCheek.be  Fact Sheet for Healthcare Providers: GravelBags.it  This test is not yet approved or cleared by the [place] FDA and  has been authorized for detection and/or diagnosis of SARS-CoV-2 by  FDA under an Emergency Use Authorization (EUA). This EUA will remain  in effect (meaning this test can be used) for the duration of the  Covid-19 declaration under Section 564(b)(1) of the Act, 21  U.S.C. section 360bbb-3(b)(1), unless the authorization is  terminated or revoked. Performed at [facility], [place] [address], [place], [place] [address]   Respiratory Panel by RT PCR (Flu A&B, Covid) - Nasopharyngeal Swab     Status: None   Collection Time: 03/09/20  9:10 AM   Specimen: Nasopharyngeal Swab  Result Value Ref Range Status   SARS Coronavirus 2 by RT PCR NEGATIVE NEGATIVE Final    Comment: (NOTE) SARS-CoV-2 target nucleic acids are NOT DETECTED.  The SARS-CoV-2 RNA is generally detectable in upper respiratoy specimens during the acute  phase of infection. The lowest concentration of SARS-CoV-2 viral copies this assay can detect is 131 copies/mL. A negative result does not  preclude SARS-Cov-2 infection and should not be used as the sole basis for treatment or other patient management decisions. A negative result may occur with  improper specimen collection/handling, submission of specimen other than nasopharyngeal swab, presence of viral mutation(s) within the areas targeted by this assay, and inadequate number of viral copies (<131 copies/mL). A negative result must be combined with clinical observations, patient history, and epidemiological information. The expected result is Negative.  Fact Sheet for Patients:  PinkCheek.be  Fact Sheet for Healthcare Providers:  GravelBags.it  This test is no t yet approved or cleared by the [place] FDA and  has been authorized for detection and/or diagnosis of SARS-CoV-2 by FDA under an Emergency Use Authorization (EUA). This EUA will remain  in effect (meaning this test can be used) for the duration of the COVID-19 declaration under Section 564(b)(1) of the Act, 21 U.S.C. section 360bbb-3(b)(1), unless the authorization is terminated or revoked sooner.     Influenza A by PCR NEGATIVE NEGATIVE Final   Influenza B by PCR NEGATIVE NEGATIVE Final    Comment: (NOTE) The Xpert Xpress SARS-CoV-2/FLU/RSV assay is intended as an aid in  the diagnosis of influenza from Nasopharyngeal swab specimens and  should not be used as a sole basis for treatment. Nasal washings and  aspirates are unacceptable for Xpert Xpress SARS-CoV-2/FLU/RSV  testing.  Fact Sheet for Patients: PinkCheek.be  Fact Sheet for Healthcare Providers: GravelBags.it  This test is not yet approved or cleared by the [place] FDA and  has been authorized for detection and/or diagnosis of SARS-CoV-2 by  FDA under an Emergency Use Authorization (EUA). This EUA will remain  in effect (meaning this test can be used) for the  duration of the  Covid-19 declaration under Section 564(b)(1) of the Act, 21  U.S.C. section 360bbb-3(b)(1), unless the authorization is  terminated or revoked. Performed at [facility], [place] [address] [place], [place] [address]          Radiology Studies: No results found.      Scheduled Meds: . amitriptyline  100 mg Oral QHS  . Chlorhexidine Gluconate Cloth  6 each Topical Daily  . clopidogrel  75 mg Oral Daily  . collagenase   Topical Daily  . fluticasone furoate-vilanterol  1 puff Inhalation Daily  . furosemide  40 mg Oral BID  . gabapentin  400 mg Oral TID  . heparin injection (subcutaneous)  5,000 Units Subcutaneous Q8H  . insulin aspart  0-15 Units Subcutaneous TID WC  . insulin aspart  0-5 Units Subcutaneous QHS  . insulin aspart  2 Units Subcutaneous TID WC  . insulin glargine  15 Units Subcutaneous Daily  . lipase/protease/amylase  72,000 Units Oral TID WC  . metoprolol succinate  50 mg Oral Daily  . multivitamin with minerals  1 tablet Oral Daily  . nicotine  7 mg Transdermal Daily  . pantoprazole  40 mg Oral Daily  . QUEtiapine  50 mg Oral QHS   Continuous Infusions: . ceFEPime (MAXIPIME) IV 2 g (03/11/20 0527)  . lactated ringers 10 mL/hr at 03/04/20 0756     LOS: 17 days   Time spent: 30 mins.More than 50% of that time was spent in counseling and/or coordination of care.  Darliss Cheney, MD [facility] P11/03/2020, 1:43 PM

## 2020-03-11 NOTE — Progress Notes (Signed)
Inpatient Diabetes Program Recommendations  AACE/ADA: New Consensus Statement on Inpatient Glycemic Control   Target Ranges:  Prepandial:   less than 140 mg/dL      Peak postprandial:   less than 180 mg/dL (1-2 hours)      Critically ill patients:  140 - 180 mg/dL   Results for Morgan Beasley, Morgan Beasley (MRN [id number]) as of 03/11/2020 09:18  Ref. Range 03/10/2020 07:41 03/10/2020 08:09 03/10/2020 11:58 03/10/2020 17:08 03/10/2020 21:44 03/11/2020 06:16 03/11/2020 07:32  Glucose-Capillary Latest Ref Range: 70 - 99 mg/dL 58 (L) 101 (H) 260 (H) 375 (H) 229 (H) 146 (H) 143 (H)   Review of Glycemic Control  Current orders for Inpatient glycemic control: Lantus 15 units daily, Novolog 0-15 units TID with meals, Novolog 0-5 units QHS, Novolog 2 units TID with meals  Inpatient Diabetes Program Recommendations:    Insulin: Please consider increasing meal coverage to Novolog 5 units TID with meals if patient eats at least 50% of meals.  Thanks, Barnie Alderman, RN, MSN, CDE Diabetes Coordinator Inpatient Diabetes Program [phone] (Team Pager from 8am to 5pm)

## 2020-03-12 ENCOUNTER — Encounter (HOSPITAL_COMMUNITY): Payer: Medicare Other

## 2020-03-12 ENCOUNTER — Inpatient Hospital Stay (HOSPITAL_COMMUNITY): Payer: Medicare Other

## 2020-03-12 DIAGNOSIS — R6521 Severe sepsis with septic shock: Secondary | ICD-10-CM | POA: Diagnosis not present

## 2020-03-12 DIAGNOSIS — A419 Sepsis, unspecified organism: Secondary | ICD-10-CM | POA: Diagnosis not present

## 2020-03-12 DIAGNOSIS — R52 Pain, unspecified: Secondary | ICD-10-CM

## 2020-03-12 DIAGNOSIS — M7989 Other specified soft tissue disorders: Secondary | ICD-10-CM

## 2020-03-12 LAB — CBC WITH DIFFERENTIAL/PLATELET
Abs Immature Granulocytes: 0.02 10*3/uL (ref 0.00–0.07)
Basophils Absolute: 0 10*3/uL (ref 0.0–0.1)
Basophils Relative: 1 %
Eosinophils Absolute: 0.2 10*3/uL (ref 0.0–0.5)
Eosinophils Relative: 3 %
HCT: 32.3 % — ABNORMAL LOW (ref 36.0–46.0)
Hemoglobin: 9.6 g/dL — ABNORMAL LOW (ref 12.0–15.0)
Immature Granulocytes: 0 %
Lymphocytes Relative: 30 %
Lymphs Abs: 2.3 10*3/uL (ref 0.7–4.0)
MCH: 24.1 pg — ABNORMAL LOW (ref 26.0–34.0)
MCHC: 29.7 g/dL — ABNORMAL LOW (ref 30.0–36.0)
MCV: 81 fL (ref 80.0–100.0)
Monocytes Absolute: 0.9 10*3/uL (ref 0.1–1.0)
Monocytes Relative: 12 %
Neutro Abs: 4.1 10*3/uL (ref 1.7–7.7)
Neutrophils Relative %: 54 %
Platelets: 344 10*3/uL (ref 150–400)
RBC: 3.99 MIL/uL (ref 3.87–5.11)
RDW: 20.2 % — ABNORMAL HIGH (ref 11.5–15.5)
WBC: 7.5 10*3/uL (ref 4.0–10.5)
nRBC: 0.4 % — ABNORMAL HIGH (ref 0.0–0.2)

## 2020-03-12 LAB — COMPREHENSIVE METABOLIC PANEL
ALT: 49 U/L — ABNORMAL HIGH (ref 0–44)
AST: 31 U/L (ref 15–41)
Albumin: 2 g/dL — ABNORMAL LOW (ref 3.5–5.0)
Alkaline Phosphatase: 220 U/L — ABNORMAL HIGH (ref 38–126)
Anion gap: 12 (ref 5–15)
BUN: 21 mg/dL — ABNORMAL HIGH (ref 6–20)
CO2: 24 mmol/L (ref 22–32)
Calcium: 8.6 mg/dL — ABNORMAL LOW (ref 8.9–10.3)
Chloride: 102 mmol/L (ref 98–111)
Creatinine, Ser: 1.38 mg/dL — ABNORMAL HIGH (ref 0.44–1.00)
GFR, Estimated: 44 mL/min — ABNORMAL LOW (ref >60)
Glucose, Bld: 252 mg/dL — ABNORMAL HIGH (ref 70–99)
Potassium: 4.3 mmol/L (ref 3.5–5.1)
Sodium: 138 mmol/L (ref 135–145)
Total Bilirubin: 0.5 mg/dL (ref 0.3–1.2)
Total Protein: 6.6 g/dL (ref 6.5–8.1)

## 2020-03-12 LAB — GLUCOSE, CAPILLARY
Glucose-Capillary: 268 mg/dL — ABNORMAL HIGH (ref 70–99)
Glucose-Capillary: 333 mg/dL — ABNORMAL HIGH (ref 70–99)
Glucose-Capillary: 225 mg/dL — ABNORMAL HIGH (ref 70–99)

## 2020-03-12 LAB — MAGNESIUM: Magnesium: 1.9 mg/dL (ref 1.7–2.4)

## 2020-03-12 MED ORDER — INSULIN GLARGINE 100 UNIT/ML ~~LOC~~ SOLN
20.0000 [IU] | Freq: Every day | SUBCUTANEOUS | Status: DC
  Administered 2020-03-12 – 2020-03-14 (×3): 20 [IU] via SUBCUTANEOUS
  Filled 2020-03-12 (×3): qty 0.2

## 2020-03-12 MED ORDER — FUROSEMIDE 10 MG/ML IJ SOLN
40.0000 mg | Freq: Two times a day (BID) | INTRAMUSCULAR | Status: DC
  Administered 2020-03-12 – 2020-03-17 (×10): 40 mg via INTRAVENOUS
  Filled 2020-03-12 (×11): qty 4

## 2020-03-12 NOTE — Progress Notes (Signed)
Left Upper Ext. study completed.   See CVProc for preliminary results.   Griffin Basil, RDMS, RVT

## 2020-03-12 NOTE — Progress Notes (Signed)
Patient removed herself from tele and continuos pulse ox.  States she is going outside to smoke a cigarette and she doesn't care what we say.  Was able to get patient back to her chair to eat breakfast. Gave 5mg  Haldol.  Giving patient morning medications. Was able to obtain BP, but patient not allowing tele leads or continuous pulse ox to be replaced at this time.

## 2020-03-12 NOTE — Progress Notes (Signed)
Physical Therapy Treatment Patient Details Name: Morgan Beasley MRN: [id number] DOB: 25-Mar-1961 Today's Date: 03/12/2020    History of Present Illness Pt is a 59 y.o. female admitted 02/23/20 with LLE pain; lab work consistent with sepsis; also with LLE cellulitis. On 10/31, pt with acute hallucinations and AMS found to have R occipital CVA with parietotemporal extension and chronic L occipital CVA. TEE 11/12 showed pulmonary valve vegetation. Ultrasound shows LUE brachial vein thrombus. Hospital course complicated by acute encephalopathy. PMH includes CHF, CKD3, COPD, DM, neuropathy, HTN, CAD, OSA, PAD, tobacco use, alcohol dependence, anxiety, gout, breast CA, R foot ulcer, LLE vascular intervention in 2021 (most recently 01/26/20).   PT Comments    Pt pleasant and agreeable to participate this session. Pt reports wanting to walk, but limited secondary to BLE pain; also limited by impaired attention, easily distracted and difficult to redirect to complete certain tasks despite multiple attempts. Session focused on seated LE therex and ADL tasks. Pt with notable swelling in BLEs and LUE. Continue to recommend SNF-level therapies to maximize functional mobility and independence.    Follow Up Recommendations  SNF;Supervision/Assistance - 24 hour     Equipment Recommendations  3in1 (PT)    Recommendations for Other Services       Precautions / Restrictions Precautions Precautions: Fall;Other (comment) Precaution Comments: L foot wound, LUE DVT    Mobility  Bed Mobility               General bed mobility comments: Received sitting in recliner  Transfers Overall transfer level: Needs assistance               General transfer comment: Initiated stand well with BUE support and min guard, but once increased WB through BLEs, pt returning to sit with c/o pain and SOB; continues to report agreeable to walk, but not standing despite rollator and cues  provided  Ambulation/Gait                 Stairs             Wheelchair Mobility    Modified Rankin (Stroke Patients Only)       Balance Overall balance assessment: Needs assistance Sitting-balance support: Feet supported Sitting balance-Leahy Scale: Fair Sitting balance - Comments: Inconsistent balance sitting at edge of recliner; able to reach outside BOS to reach purse from bed, unable to reach bilateral feet to don shoes, requiring assist                                    Cognition Arousal/Alertness: Awake/alert Behavior During Therapy: Restless Overall Cognitive Status: No family/caregiver present to determine baseline cognitive functioning Area of Impairment: Orientation;Attention;Memory;Following commands;Safety/judgement;Awareness;Problem solving                 Orientation Level: Place;Time;Situation;Disoriented to Current Attention Level: Sustained Memory: Decreased recall of precautions;Decreased short-term memory Following Commands: Follows one step commands inconsistently;Follows one step commands with increased time Safety/Judgement: Decreased awareness of safety;Decreased awareness of deficits Awareness: Intellectual Problem Solving: Slow processing;Requires verbal cues;Requires tactile cues General Comments: Pleasant and agreeable, although restless and difficult to redirect. Internally distracted by pain and SOB, requires frequent redirection to task and cues for mobility; continues to report wanting to ambulate, but ultimately not following comands to mobilize beyond chair-level      Exercises General Exercises - Lower Extremity Ankle Circles/Pumps: AROM;Both;Seated Long Arc Quad: AROM;Both;Seated Hip Flexion/Marching: AROM;Both;Seated (partial  range) Toe Raises: AROM;Both;Seated    General Comments General comments (skin integrity, edema, etc.): DOE 3/4 with seated ADL tasks, SpO2 86-92% on RA; O2 [place] replaced and SpO2  >/96%      Pertinent Vitals/Pain Pain Assessment: Faces Faces Pain Scale: Hurts little more Pain Location: Feet, LUE Pain Descriptors / Indicators: Aching;Throbbing;Heaviness Pain Intervention(s): Monitored during session;Limited activity within patient's tolerance    Home Living                      Prior Function            PT Goals (current goals can now be found in the care plan section) Acute Rehab PT Goals Patient Stated Goal: "I want to walk" PT Goal Formulation: With patient Time For Goal Achievement: 03/26/20 Potential to Achieve Goals: Fair Progress towards PT goals: Progressing toward goals    Frequency    Min 3X/week      PT Plan Current plan remains appropriate    Co-evaluation              AM-PAC PT "6 Clicks" Mobility   Outcome Measure  Help needed turning from your back to your side while in a flat bed without using bedrails?: A Little Help needed moving from lying on your back to sitting on the side of a flat bed without using bedrails?: A Little Help needed moving to and from a bed to a chair (including a wheelchair)?: A Little Help needed standing up from a chair using your arms (e.g., wheelchair or bedside chair)?: A Little Help needed to walk in hospital room?: A Lot Help needed climbing 3-5 steps with a railing? : A Lot 6 Click Score: 16    End of Session Equipment Utilized During Treatment: Oxygen Activity Tolerance: Patient limited by fatigue Patient left: in chair;with call bell/phone within reach;with chair alarm set Nurse Communication: Mobility status PT Visit Diagnosis: Other abnormalities of gait and mobility (R26.89);Pain;Muscle weakness (generalized) (M62.81);Unsteadiness on feet (R26.81) Pain - Right/Left: Left Pain - part of body: Leg;Ankle and joints of foot     Time: 1355-1416 PT Time Calculation (min) (ACUTE ONLY): 21 min  Charges:  $Therapeutic Exercise: 8-22 mins                    Mabeline Caras, PT,  DPT Acute Rehabilitation Services  Pager [phone] Office [place] 03/12/2020, 3:48 PM

## 2020-03-12 NOTE — Progress Notes (Signed)
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 She was found to have sepsis secondary to Serratia bacteremia from left lower extremity cellulitis with complicated chronic nonhealing ulcer. Hospital course remarkable for persistent lactic acidosis concerning for critical lower extremity vascular ischemia. Vascular surgery consulted and she was transferred to [facility]. During this hospitalization, she developed acute nonhemorrhagic stroke, also developed AKI and shock liver due to hypotension. Neurology following for stroke.Underwnt TEE on 11.4.21. Hospital course remarkable for persistent encephalopathy but now the mental status has improved but remains intermittently agitated.  TEE showed pulmonary valve vegetation.  Plan to continue cefepime till 03/21/2020.  PT/OT recommended skilled nursing  facility on discharge.  Plan was for discharge on 03/09/2020 but she was agitated and had to be restrained.  Assessment & Plan:   Principal Problem:   Septic shock ([place]) Active Problems:   Uncontrolled diabetes mellitus with circulatory complication, with long-term current use of insulin (HCC)   COPD (chronic obstructive pulmonary disease) (HCC)   PVD (peripheral vascular disease) (HCC)   Ischemia of left lower  extremity   Chronic systolic CHF (congestive heart failure) (HCC)   Obesity (BMI 30-39.9)   Cerebral embolism with cerebral infarction   Septic shock/gram-negative bacteremia: In the setting of  bilateral lower extremity peripheral artery disease with chronic left lower extremity nonhealing ulcer diabetic patient. Hospital course remarkable for shock, AKI.  Blood culture from 02/23/2020 + however from 03/05/2020 are negative to date.  She was treated with IV fluids. Sepsis physiology has resolved. Continue current antibiotics and wound care. ID also consulted for finding of possible pulmonary valve vegetation.  They recommended to continue cefepime through 03/21/2020.  Hypokalemia: Resolved.  Acute nonhemorrhagic stroke: Hospital course remarkable for encephalopathy secondary to acute right occipital infarct. She developed mild left arm weakness. Neurology consulted.  Stroke work-up completed. On Plavix currently. TEE showed ejection fraction of 40%, no PFO or  cardiac source of emboli but probable pulmonary valve vegetation.  Also showed catheter thrombus. PT/OT evaluation done and recommended skilled nursing facility.-Once LFTs improve a little more.  They are stable with not much improvement today.  Possible pulmonary valve vegetation/catheter related vegetation versus thrombus: Uncommon scenario of finding of possible pulmonary vegetation in the setting of gram-negative bacteremia. ID was following .  Per them, no need to start anticoagulation for catheter related thrombus. No need to remove central line.  Continue cefepime till 03/21/2020.  Acute encephalopathy/agitation/aggressive behavior/delirium:  Behavioral disturbance could be associated with the stroke or underlying psychiatric issues/personality disorders.  Psychiatry was consulted and they recommended as needed Ativan and adding Trileptal if personality changes become an issue.  According to the nurse, patient was earlier agitated and  was trying to leave again but she was easily redirectable.  When I saw this patient, she was sitting in the recliner, was calm and polite.  Although she wanted to go  home but she was following commands and was willing to comply.  Continue delirium precautions and nightly Seroquel.  Peripheral artery disease/nonhealing lower extremity ulcer: Vascular surgery following. She is status post left femoropopliteal bypass on 10/19/2019. On Plavix and statin at home.  Vascular surgery ordered ABI/lower extremity arterial duplex for outpatient follow up.  Chronic combined systolic/diastolic congestive heart failure: Echo done few months ago showed ejection fraction of 40 to 40% with global hypokinesis. Elevated BNP. She received fluids during this hospitalization for septic shock.  She still looks volume overloaded with some edema in lower extremities and left upper extremity.  We will switch her from oral Lasix to IV Lasix 40 mg twice daily.  Prolonged QTc: Last EKG showed prolonged QT.Will check repeat EKG  Elevated liver enzymes: Most likely from shock liver from hypotension. Right upper quadrant ultrasound showed nonspecific changes. She was seen by general surgery, no plan for intervention. Acute hepatitis panel negative. Right upper quadrant liver Doppler stable. Questionable early NASH on right upper quadrant ultrasound.Liver enzymes improving,check CMP tomorrow.  Diabetes type 2/uncontrolled hyperglycemia: Hemoglobin A1c of 15.  Hypoglycemic this morning.  Reduce Lantus to 15 units and continue SSI.  Anemia of chronic disease: Currently hemoglobin is stable. Continue to monitor.  AKI on CKD stage IIIa: Baseline creatinine 1.4. Hospital course remarkable for AKI secondary to contrast nephropathy, dehydration, hypotension. Currently kidney function at baseline.  Hyperlipidemia: On statin and Zetia. Will resume statin when liver function improves.check CMP tomorow  COPD/tobacco abuse: Counseled for  smoking cessation. Currently stable in terms of COPD.  Obesity with OSA: BMI of 32. Uses cpap at night at home.  Hypertension: Hospital course remarkable for hypotension.  Beta-blocker initially on hold but resumed on 03/04/2020.  Blood pressure slightly on the low side.  Monitor closely.  Reactive mediastinal/hilar/right supraclavicular adenopathy: Incidental finding during this hospitalization. Outpatient biopsy recommended  Debility/deconditioning: PT/OT recommended SN facility on discharge..  Left upper extremity swelling: Venous ultrasound was ordered yesterday but this was not done until this morning/24 hours since the order was placed.  Discussed with primary RN who reached out to ultrasound and finally this was completed and this shows left upper extremity brachial vein superficial thrombus.  I have advised the RN and placed orders for keeping the left upper extremity elevated and warm compresses.  Due to her CKD, I would avoid NSAIDs which is recommended as a treatment per up-to-date.  Acute hypoxic respiratory failure: Patient has been requiring about 2 L of oxygen.  On examination, she has decreased breath sounds.  This is likely due to atelectasis.  Chest x-ray shows edema.  Encouraged incentive spirometry.  Hypomagnesemia: Resolved.  Nutrition Problem: Increased nutrient needs Etiology: wound healing    DVT prophylaxis:Heparin [place] Code Status: Full Family Communication: None present at bedside.  Discussed with her daughter [name] over the phone yesterday. Remains inpatient appropriate because:Inpatient level of care appropriate due to severity of illness   Dispo: The patient is from: Home              Anticipated d/c is to: SNF               Anticipated d/c date is: 1 to 2 days.  Per TOC, she has lost the bed.  There is no bed available for her at this point in time.  Consultants: Vascular surgery, Neurology   Procedures:TEE  Antimicrobials:  Anti-infectives (From  admission, onward)   Start     Dose/Rate Route Frequency Ordered Stop  03/07/20 0000  ceFEPime (MAXIPIME) IVPB        2 g Intravenous Every 12 hours 03/07/20 0819 03/23/20 2359   03/02/20 1800  ceFEPIme (MAXIPIME) 2 g in sodium chloride 0.9 % 100 mL IVPB        2 g 200 mL/hr over 30 Minutes Intravenous Every 12 hours 03/02/20 0853     02/28/20 0546  ceFEPIme (MAXIPIME) 2 g in sodium chloride 0.9 % 100 mL IVPB  Status:  Discontinued        2 g 200 mL/hr over 30 Minutes Intravenous Every 24 hours 02/27/20 1041 03/02/20 0853   02/25/20 1400  metroNIDAZOLE (FLAGYL) tablet 500 mg  Status:  Discontinued        500 mg Oral Every 8 hours 02/25/20 1025 02/29/20 0911   02/24/20 1800  ceFEPIme (MAXIPIME) 2 g in sodium chloride 0.9 % 100 mL IVPB  Status:  Discontinued        2 g 200 mL/hr over 30 Minutes Intravenous Every 12 hours 02/24/20 0732 02/27/20 1041   02/24/20 0600  ceFEPIme (MAXIPIME) 2 g in sodium chloride 0.9 % 100 mL IVPB  Status:  Discontinued        2 g 200 mL/hr over 30 Minutes Intravenous Every 8 hours 02/24/20 0440 02/24/20 0732   02/23/20 1015  vancomycin (VANCOREADY) IVPB 1750 mg/350 mL        1,750 mg 175 mL/hr over 120 Minutes Intravenous  Once 02/23/20 1007 02/23/20 1347   02/23/20 1000  cefTRIAXone (ROCEPHIN) 2 g in sodium chloride 0.9 % 100 mL IVPB  Status:  Discontinued        2 g 200 mL/hr over 30 Minutes Intravenous Every 24 hours 02/23/20 0952 02/24/20 0440   02/23/20 1000  metroNIDAZOLE (FLAGYL) IVPB 500 mg  Status:  Discontinued        500 mg 100 mL/hr over 60 Minutes Intravenous Every 8 hours 02/23/20 0952 02/25/20 1025      Subjective: Seen and examined earlier today.  Patient was in the recliner, comfortable with no complaints.  Following commands and compliant.  She wants to go home only because she misses her cat.  Objective: Vitals:   03/11/20 2300 03/12/20 0744 03/12/20 0848 03/12/20 1256  BP: (!) 155/126     Pulse: 90  92 93  Resp: (!) 35  20   Temp:  97.9 F (36.6 C)  97.9 F (36.6 C) 98.2 F (36.8 C)  TempSrc: Oral Oral Oral Oral  SpO2: 99%  93% 95%  Weight:      Height:        Intake/Output Summary (Last 24 hours) at 03/12/2020 1438 Last data filed at 03/12/2020 1000 Gross per 24 hour  Intake 360 ml  Output 200 ml  Net 160 ml   Filed Weights   02/23/20 0937 03/04/20 0741  Weight: 90.3 kg 86.2 kg    Examination:  General exam: Appears calm and comfortable  Respiratory system: Diminished breath sounds bilaterally. Respiratory effort normal. Cardiovascular system: S1 & S2 heard, RRR. No JVD, murmurs, rubs, gallops or clicks. No pedal edema. Gastrointestinal system: Abdomen is nondistended, soft and nontender. No organomegaly or masses felt. Normal bowel sounds heard. Central nervous system: Alert and oriented. No focal neurological deficits. Extremities: Symmetric 5 x 5 power. Skin: Ulcer on the right dorsum of the foot Psychiatry: Judgement and insight appear poor. Mood & affect appropriate.    Data Reviewed: I have personally reviewed following labs and imaging studies  CBC: Recent  Labs  Lab 03/08/20 0650 03/11/20 0240 03/12/20 0659  WBC 8.7 7.1 7.5  NEUTROABS 5.8 4.1 4.1  HGB 10.3* 8.5* 9.6*  HCT 34.6* 28.3* 32.3*  MCV 82.4 80.6 81.0  PLT 361 320 542   Basic Metabolic Panel: Recent Labs  Lab 03/08/20 0650 03/09/20 0721 03/10/20 0631 03/11/20 0240 03/12/20 0659  NA 139 138 143 140 138  K 3.8 3.7 3.3* 3.3* 4.3  CL 104 103 106 103 102  CO2 26 28 30 30 24   GLUCOSE 236* 191* 55* 201* 252*  BUN 22* 20 17 18  21*  CREATININE 1.37* 1.28* 1.14* 1.17* 1.38*  CALCIUM 8.4* 8.4* 8.4* 8.0* 8.6*  MG  --   --   --  1.2* 1.9   GFR: Estimated Creatinine Clearance: 49.2 mL/min (A) (by C-G formula based on SCr of 1.38 mg/dL (H)). Liver Function Tests: Recent Labs  Lab 03/10/20 0631 03/11/20 0240 03/12/20 0659  AST 38 44* 31  ALT 61* 55* 49*  ALKPHOS 227* 220* 220*  BILITOT 0.5 0.5 0.5  PROT 6.4* 5.9*  6.6  ALBUMIN 1.9* 1.8* 2.0*   No results for input(s): LIPASE, AMYLASE in the last 168 hours. No results for input(s): AMMONIA in the last 168 hours. Coagulation Profile: No results for input(s): INR, PROTIME in the last 168 hours. Cardiac Enzymes: No results for input(s): CKTOTAL, CKMB, CKMBINDEX, TROPONINI in the last 168 hours. BNP (last 3 results) Recent Labs    12/19/19 1546 02/09/20 0825  PROBNP 1,148* 1,185*   HbA1C: No results for input(s): HGBA1C in the last 72 hours. CBG: Recent Labs  Lab 03/11/20 0732 03/11/20 1202 03/11/20 1651 03/11/20 2026 03/12/20 1253  GLUCAP 143* 150* 269* 203* 268*   Lipid Profile: No results for input(s): CHOL, HDL, LDLCALC, TRIG, CHOLHDL, LDLDIRECT in the last 72 hours. Thyroid Function Tests: No results for input(s): TSH, T4TOTAL, FREET4, T3FREE, THYROIDAB in the last 72 hours. Anemia Panel: No results for input(s): VITAMINB12, FOLATE, FERRITIN, TIBC, IRON, RETICCTPCT in the last 72 hours. Sepsis Labs: No results for input(s): PROCALCITON, LATICACIDVEN in the last 168 hours.  Recent Results (from the past 240 hour(s))  Culture, blood (routine x 2)     Status: None   Collection Time: 03/05/20  2:37 PM   Specimen: BLOOD LEFT HAND  Result Value Ref Range Status   Specimen Description BLOOD LEFT HAND  Final   Special Requests   Final    BOTTLES DRAWN AEROBIC ONLY Blood Culture results may not be optimal due to an inadequate volume of blood received in culture bottles   Culture   Final    NO GROWTH 5 DAYS Performed at [facility], [place] [address], [place], [place] [address]    Report Status 03/10/2020 FINAL  Final  Culture, blood (routine x 2)     Status: None   Collection Time: 03/05/20  2:37 PM   Specimen: BLOOD LEFT HAND  Result Value Ref Range Status   Specimen Description BLOOD LEFT HAND  Final   Special Requests   Final    BOTTLES DRAWN AEROBIC ONLY Blood Culture results may not be optimal due to an inadequate volume  of blood received in culture bottles   Culture   Final    NO GROWTH 5 DAYS Performed at [facility], [place] [address], [place], [place] [address]    Report Status 03/10/2020 FINAL  Final  Respiratory Panel by RT PCR (Flu A&B, Covid) - Nasopharyngeal Swab  Status: None   Collection Time: 03/05/20 10:16 PM   Specimen: Nasopharyngeal Swab  Result Value Ref Range Status   SARS Coronavirus 2 by RT PCR NEGATIVE NEGATIVE Final    Comment: (NOTE) SARS-CoV-2 target nucleic acids are NOT DETECTED.  The SARS-CoV-2 RNA is generally detectable in upper respiratoy specimens during the acute phase of infection. The lowest concentration of SARS-CoV-2 viral copies this assay can detect is 131 copies/mL. A negative result does not preclude SARS-Cov-2 infection and should not be used as the sole basis for treatment or other patient management decisions. A negative result may occur with  improper specimen collection/handling, submission of specimen other than nasopharyngeal swab, presence of viral mutation(s) within the areas targeted by this assay, and inadequate number of viral copies (<131 copies/mL). A negative result must be combined with clinical observations, patient history, and epidemiological information. The expected result is Negative.  Fact Sheet for Patients:  PinkCheek.be  Fact Sheet for Healthcare Providers:  GravelBags.it  This test is no t yet approved or cleared by the [place] FDA and  has been authorized for detection and/or diagnosis of SARS-CoV-2 by FDA under an Emergency Use Authorization (EUA). This EUA will remain  in effect (meaning this test can be used) for the duration of the COVID-19 declaration under Section 564(b)(1) of the Act, 21 U.S.C. section 360bbb-3(b)(1), unless the authorization is terminated or revoked sooner.     Influenza A by PCR NEGATIVE NEGATIVE Final   Influenza B by PCR  NEGATIVE NEGATIVE Final    Comment: (NOTE) The Xpert Xpress SARS-CoV-2/FLU/RSV assay is intended as an aid in  the diagnosis of influenza from Nasopharyngeal swab specimens and  should not be used as a sole basis for treatment. Nasal washings and  aspirates are unacceptable for Xpert Xpress SARS-CoV-2/FLU/RSV  testing.  Fact Sheet for Patients: PinkCheek.be  Fact Sheet for Healthcare Providers: GravelBags.it  This test is not yet approved or cleared by the [place] FDA and  has been authorized for detection and/or diagnosis of SARS-CoV-2 by  FDA under an Emergency Use Authorization (EUA). This EUA will remain  in effect (meaning this test can be used) for the duration of the  Covid-19 declaration under Section 564(b)(1) of the Act, 21  U.S.C. section 360bbb-3(b)(1), unless the authorization is  terminated or revoked. Performed at [facility], [place] [address] [place], [place] [address]   Respiratory Panel by RT PCR (Flu A&B, Covid) - Nasopharyngeal Swab     Status: None   Collection Time: 03/09/20  9:10 AM   Specimen: Nasopharyngeal Swab  Result Value Ref Range Status   SARS Coronavirus 2 by RT PCR NEGATIVE NEGATIVE Final    Comment: (NOTE) SARS-CoV-2 target nucleic acids are NOT DETECTED.  The SARS-CoV-2 RNA is generally detectable in upper respiratoy specimens during the acute phase of infection. The lowest concentration of SARS-CoV-2 viral copies this assay can detect is 131 copies/mL. A negative result does not preclude SARS-Cov-2 infection and should not be used as the sole basis for treatment or other patient management decisions. A negative result may occur with  improper specimen collection/handling, submission of specimen other than nasopharyngeal swab, presence of viral mutation(s) within the areas targeted by this assay, and inadequate number of viral copies (<131 copies/mL). A negative result must  be combined with clinical observations, patient history, and epidemiological information. The expected result is Negative.  Fact Sheet for Patients:  PinkCheek.be  Fact Sheet for Healthcare Providers:  GravelBags.it  This  test is no t yet approved or cleared by the [place] and  has been authorized for detection and/or diagnosis of SARS-CoV-2 by FDA under an Emergency Use Authorization (EUA). This EUA will remain  in effect (meaning this test can be used) for the duration of the COVID-19 declaration under Section 564(b)(1) of the Act, 21 U.S.C. section 360bbb-3(b)(1), unless the authorization is terminated or revoked sooner.     Influenza A by PCR NEGATIVE NEGATIVE Final   Influenza B by PCR NEGATIVE NEGATIVE Final    Comment: (NOTE) The Xpert Xpress SARS-CoV-2/FLU/RSV assay is intended as an aid in  the diagnosis of influenza from Nasopharyngeal swab specimens and  should not be used as a sole basis for treatment. Nasal washings and  aspirates are unacceptable for Xpert Xpress SARS-CoV-2/FLU/RSV  testing.  Fact Sheet for Patients: PinkCheek.be  Fact Sheet for Healthcare Providers: GravelBags.it  This test is not yet approved or cleared by the [place] FDA and  has been authorized for detection and/or diagnosis of SARS-CoV-2 by  FDA under an Emergency Use Authorization (EUA). This EUA will remain  in effect (meaning this test can be used) for the duration of the  Covid-19 declaration under Section 564(b)(1) of the Act, 21  U.S.C. section 360bbb-3(b)(1), unless the authorization is  terminated or revoked. Performed at [facility], [place] [address], [place], [place] [address]          Radiology Studies: DG CHEST PORT 1 VIEW  Result Date: 03/11/2020 CLINICAL DATA:  Hypoxia. EXAM: PORTABLE CHEST 1 VIEW COMPARISON:  02/27/2020 FINDINGS:  There is artifact from multiple external objects overlying the chest including a round radiopaque object obscuring the right lung base. A left PICC remains in place terminating over the SVC. The cardiac silhouette remains enlarged. There is persistent pulmonary vascular congestion with mixed interstitial and airspace opacities bilaterally, greatest in the lung bases and increased from the prior study. No large pleural effusion or pneumothorax is identified. No acute osseous abnormality is seen. IMPRESSION: Worsening bilateral lung opacities compatible with edema. Electronically Signed   By: Logan Bores M.D.   On: 03/11/2020 14:37   VAS [place] UPPER EXTREMITY VENOUS DUPLEX  Result Date: 03/12/2020 UPPER VENOUS STUDY  Indications: Pain, and Swelling Risk Factors: Obesity. Performing Technologist: Griffin Basil RCT RDMS  Examination Guidelines: A complete evaluation includes B-mode imaging, spectral Doppler, color Doppler, and power Doppler as needed of all accessible portions of each vessel. Bilateral testing is considered an integral part of a complete examination. Limited examinations for reoccurring indications may be performed as noted.  Left Findings: +----------+------------+---------+-----------+----------+--------------------+ LEFT      CompressiblePhasicitySpontaneousProperties      Summary        +----------+------------+---------+-----------+----------+--------------------+ IJV           Full       Yes       Yes                                   +----------+------------+---------+-----------+----------+--------------------+ Subclavian    Full       Yes       Yes                                   +----------+------------+---------+-----------+----------+--------------------+ Axillary      Full       Yes  Yes                                   +----------+------------+---------+-----------+----------+--------------------+ Brachial      Full       Yes       Yes                                    +----------+------------+---------+-----------+----------+--------------------+ Radial        Full                                                       +----------+------------+---------+-----------+----------+--------------------+ Ulnar         Full                                                       +----------+------------+---------+-----------+----------+--------------------+ Cephalic      None                                     Mid upper Arm                                                               Thrombus       +----------+------------+---------+-----------+----------+--------------------+ Basilic       Full                                                       +----------+------------+---------+-----------+----------+--------------------+  Summary:  Right: No evidence of thrombosis in the subclavian.  Left: No evidence of deep vein thrombosis in the upper extremity. Findings consistent with acute superficial vein thrombosis involving the left cephalic vein.  *See table(s) above for measurements and observations.    Preliminary         Scheduled Meds: . amitriptyline  100 mg Oral QHS  . Chlorhexidine Gluconate Cloth  6 each Topical Daily  . clopidogrel  75 mg Oral Daily  . collagenase   Topical Daily  . fluticasone furoate-vilanterol  1 puff Inhalation Daily  . furosemide  40 mg Intravenous BID  . gabapentin  400 mg Oral TID  . heparin injection (subcutaneous)  5,000 Units Subcutaneous Q8H  . insulin aspart  0-15 Units Subcutaneous TID WC  . insulin aspart  0-5 Units Subcutaneous QHS  . insulin aspart  2 Units Subcutaneous TID WC  . insulin glargine  20 Units Subcutaneous Daily  . lipase/protease/amylase  72,000 Units Oral TID WC  . metoprolol succinate  50 mg Oral Daily  . multivitamin with minerals  1 tablet Oral Daily  . nicotine  7 mg Transdermal Daily  .  pantoprazole  40 mg Oral Daily  . QUEtiapine  50 mg Oral  QHS   Continuous Infusions: . ceFEPime (MAXIPIME) IV 2 g (03/12/20 0651)  . lactated ringers 10 mL/hr at 03/04/20 0756     LOS: 18 days   Time spent: 29 mins.More than 50% of that time was spent in counseling and/or coordination of care.  Darliss Cheney, MD [facility] P11/03/2020, 2:38 PM

## 2020-03-13 DIAGNOSIS — A419 Sepsis, unspecified organism: Secondary | ICD-10-CM | POA: Diagnosis not present

## 2020-03-13 DIAGNOSIS — R6521 Severe sepsis with septic shock: Secondary | ICD-10-CM | POA: Diagnosis not present

## 2020-03-13 LAB — COMPREHENSIVE METABOLIC PANEL
ALT: 41 U/L (ref 0–44)
AST: 24 U/L (ref 15–41)
Albumin: 1.9 g/dL — ABNORMAL LOW (ref 3.5–5.0)
Alkaline Phosphatase: 212 U/L — ABNORMAL HIGH (ref 38–126)
Anion gap: 8 (ref 5–15)
BUN: 23 mg/dL — ABNORMAL HIGH (ref 6–20)
CO2: 29 mmol/L (ref 22–32)
Calcium: 8.8 mg/dL — ABNORMAL LOW (ref 8.9–10.3)
Chloride: 102 mmol/L (ref 98–111)
Creatinine, Ser: 1.29 mg/dL — ABNORMAL HIGH (ref 0.44–1.00)
GFR, Estimated: 48 mL/min — ABNORMAL LOW (ref >60)
Glucose, Bld: 126 mg/dL — ABNORMAL HIGH (ref 70–99)
Potassium: 4.1 mmol/L (ref 3.5–5.1)
Sodium: 139 mmol/L (ref 135–145)
Total Bilirubin: 0.5 mg/dL (ref 0.3–1.2)
Total Protein: 6.6 g/dL (ref 6.5–8.1)

## 2020-03-13 LAB — CBC WITH DIFFERENTIAL/PLATELET
Abs Immature Granulocytes: 0.02 10*3/uL (ref 0.00–0.07)
Basophils Absolute: 0 10*3/uL (ref 0.0–0.1)
Basophils Relative: 1 %
Eosinophils Absolute: 0.3 10*3/uL (ref 0.0–0.5)
Eosinophils Relative: 5 %
HCT: 33.1 % — ABNORMAL LOW (ref 36.0–46.0)
Hemoglobin: 9.9 g/dL — ABNORMAL LOW (ref 12.0–15.0)
Immature Granulocytes: 0 %
Lymphocytes Relative: 30 %
Lymphs Abs: 1.8 10*3/uL (ref 0.7–4.0)
MCH: 24.8 pg — ABNORMAL LOW (ref 26.0–34.0)
MCHC: 29.9 g/dL — ABNORMAL LOW (ref 30.0–36.0)
MCV: 82.8 fL (ref 80.0–100.0)
Monocytes Absolute: 0.8 10*3/uL (ref 0.1–1.0)
Monocytes Relative: 13 %
Neutro Abs: 3.1 10*3/uL (ref 1.7–7.7)
Neutrophils Relative %: 51 %
Platelets: 343 10*3/uL (ref 150–400)
RBC: 4 MIL/uL (ref 3.87–5.11)
RDW: 20.2 % — ABNORMAL HIGH (ref 11.5–15.5)
WBC: 6 10*3/uL (ref 4.0–10.5)
nRBC: 0.8 % — ABNORMAL HIGH (ref 0.0–0.2)

## 2020-03-13 LAB — GLUCOSE, CAPILLARY
Glucose-Capillary: 115 mg/dL — ABNORMAL HIGH (ref 70–99)
Glucose-Capillary: 215 mg/dL — ABNORMAL HIGH (ref 70–99)
Glucose-Capillary: 289 mg/dL — ABNORMAL HIGH (ref 70–99)
Glucose-Capillary: 306 mg/dL — ABNORMAL HIGH (ref 70–99)

## 2020-03-13 MED ORDER — ATORVASTATIN CALCIUM 10 MG PO TABS
20.0000 mg | ORAL_TABLET | Freq: Every day | ORAL | Status: DC
  Administered 2020-03-13 – 2020-03-18 (×6): 20 mg via ORAL
  Filled 2020-03-13 (×5): qty 2

## 2020-03-13 NOTE — Progress Notes (Signed)
Rounding for evening flush left DL PICC.Patient refusing.Primary RN aware.

## 2020-03-13 NOTE — Progress Notes (Signed)
Patient refused lasix and cefepime tonight.  Dressed and waiting for fa,ily to come pick her up. Security is aware as the patient is still IVC.

## 2020-03-13 NOTE — Progress Notes (Signed)
Patient refusing to sit in chair or bed.  Walking down the hall stating she is going to smoke a cigarette. Refusing to let RN near her arm to give medication.  Aggressive toward RN and CNA. Slowly able to get patient back to room.  Currently sitting in her room and talking to her daughter on the phone. Took PO meds.

## 2020-03-13 NOTE — TOC Progression Note (Signed)
Transition of Care [facility]) - Progression Note    Patient Details  Name: Morgan Beasley MRN: [id number] Date of Birth: May 22, 1960  Transition of Care [facility]) CM/SW Contact  Carles Collet, RN Phone Number: 03/13/2020, 12:12 PM  Clinical Narrative:   Had three way conversation with both daughters over the phone, after visiting patient in room. Patient in room with bedside sitter due to behaviors this morning.  MD requested I speak w family to assess ability to care for patient at home with IV abx. Patient lives at home alone. Discussed with daughters the level of support needed for BID IV Abx, and wound care, as well as safety supervision. (Cefepime BID, and Santil with wrap to R top of foot) Per daughter Morgan Beasley, she states she works 40 hours a week, and is "not able to physically care for her." She states she would not be able to do wound care. After discussing that IV Abx would run through 11/21 and be complete prior to Thanksgiving, Morgan Beasley states that she "needs to go straight to rehab" with plans to return home before Thanksgiving. Per daughter Morgan Beasley, she states that they "are not realistically in the shape to care for her at home." She identified that the patient needs watched while eating due to aspiration risk, and would also not have supervision at home during the night. Daughters confirm that patient has [employer] worker that stays 2 hours a day.  They would like these hours extended.    Morgan Beasley Daughter   [phone]  Morgan Beasley Daughter   [phone]       Expected Discharge Plan: [place] Barriers to Discharge: No Barriers Identified  Expected Discharge Plan and Services Expected Discharge Plan: [place] Choice: [place] arrangements for the past 2 months: Single Family Home                                       Social Determinants of Health (SDOH) Interventions    Readmission  Risk Interventions Readmission Risk Prevention Plan 10/21/2019  Transportation Screening Complete  Medication Review ([place]) Complete  PCP or Specialist appointment within 3-5 days of discharge Complete  HRI or [place] Complete  SW Recovery Care/Counseling Consult Complete  [place] Not Applicable  Some recent data might be hidden

## 2020-03-13 NOTE — Progress Notes (Signed)
Patient was informed by her daughter that her niece passed away.  Patient is very upset, trying to leave. Got aggressive with staff because she isn't able to leave.  Patient calling the police from the room.   Charge nurse attempted to calm patient as well as other staff.  Unable to give medicine because patient does not want it and threatening RN.

## 2020-03-13 NOTE — Progress Notes (Signed)
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 She was found to have sepsis secondary to Serratia bacteremia from left lower extremity cellulitis with complicated chronic nonhealing ulcer. Hospital course remarkable for persistent lactic acidosis concerning for critical lower extremity vascular ischemia. Vascular surgery consulted and she was transferred to [facility]. During this hospitalization, she developed acute nonhemorrhagic stroke, also developed AKI and shock liver due to hypotension. Neurology following for stroke.Underwnt TEE on 11.4.21. Hospital course remarkable for persistent encephalopathy but now the mental status has improved but remains intermittently agitated.  TEE showed pulmonary valve vegetation.  Plan to continue cefepime till 03/21/2020.  PT/OT recommended skilled nursing  facility on discharge.  Plan was for discharge on 03/09/2020 but she was agitated and had to be restrained.  Assessment & Plan:   Principal Problem:   Septic shock ([place]) Active Problems:   Uncontrolled diabetes mellitus with circulatory complication, with long-term current use of insulin (HCC)   COPD (chronic obstructive pulmonary disease) (HCC)   PVD (peripheral vascular disease) (HCC)   Ischemia of left lower  extremity   Chronic systolic CHF (congestive heart failure) (HCC)   Obesity (BMI 30-39.9)   Cerebral embolism with cerebral infarction   Septic shock/gram-negative bacteremia: In the setting of  bilateral lower extremity peripheral artery disease with chronic left lower extremity nonhealing ulcer diabetic patient. Hospital course remarkable for shock, AKI.  Blood culture from 02/23/2020 + however from 03/05/2020 are negative to date.  She was treated with IV fluids. Sepsis physiology has resolved. Continue current antibiotics and wound care. ID also consulted for finding of possible pulmonary valve vegetation.  They recommended to continue cefepime through 03/21/2020.  Hypokalemia: Resolved.  Acute nonhemorrhagic stroke: Hospital course remarkable for encephalopathy secondary to acute right occipital infarct. She developed mild left arm weakness. Neurology consulted.  Stroke work-up completed. On Plavix currently. TEE showed ejection fraction of 40%, no PFO or  cardiac source of emboli but probable pulmonary valve vegetation.  Also showed catheter thrombus.  Now that her LFTs have normalized, will start on low-dose of atorvastatin 20 mg. PT/OT evaluation done and recommended skilled nursing facility.-  Possible pulmonary valve vegetation/catheter related vegetation versus thrombus: Uncommon scenario of finding of possible pulmonary vegetation in the setting of gram-negative bacteremia. ID was following .  Per them, no need to start anticoagulation for catheter related thrombus. No need to remove central line.  Continue cefepime till 03/21/2020.  Acute encephalopathy/agitation/aggressive behavior/delirium:  Behavioral disturbance could be associated with the stroke or underlying psychiatric issues/personality disorders.  Psychiatry was consulted and they recommended as needed Ativan and adding Trileptal if personality changes become an issue.  However 80 minutes not recommended as a treatment of  delirium so I will discontinue that and continue Haldol for agitation.  Patient has been having intermittent episodes of agitation.  However, this is my fourth day of seeing her and  I have not ever seen her being agitated.  She seems to be frustrated for being in the hospital.  Otherwise her delirium seem to have resolved.  We will continue delirium precautions and nightly Seroquel.  Daughter at the bedside.  Patient is actually missing her home and her cat.  She was crying.  She was reassured that her cat is being taken care of.  Daughter at the bedside expressed her wishes that they would like to take patient home if possible.  I have alerted TOC to find out how we can arrange outpatient/in-home antibiotics as she is going to need cefepime every 12 hours through 03/21/2020.  Peripheral artery disease/nonhealing lower extremity ulcer: Vascular surgery following. She is status post left femoropopliteal bypass on 10/19/2019. On Plavix and statin at home.  Vascular surgery ordered ABI/lower extremity arterial duplex for outpatient follow up.  Chronic combined systolic/diastolic congestive heart failure: Echo done few months ago showed ejection fraction of 40 to 40% with global hypokinesis. Elevated BNP. She received fluids during this hospitalization for septic shock.  She still looks volume overloaded with some edema in lower extremities and left upper extremity.  We will switch her from oral Lasix to IV Lasix 40 mg twice daily.  Prolonged QTc: Last EKG showed prolonged QT.Will check repeat EKG  Elevated liver enzymes: Most likely from shock liver from hypotension. Right upper quadrant ultrasound showed nonspecific changes. She was seen by general surgery, no plan for intervention. Acute hepatitis panel negative. Right upper quadrant liver Doppler stable. Questionable early NASH on right upper quadrant ultrasound.Liver enzymes improved.  Diabetes type 2/uncontrolled hyperglycemia: Hemoglobin A1c of 15.   Hypoglycemic this morning.  Reduce Lantus to 15 units and continue SSI.  Anemia of chronic disease: Currently hemoglobin is stable. Continue to monitor.  AKI on CKD stage IIIa: Baseline creatinine 1.4. Hospital course remarkable for AKI secondary to contrast nephropathy, dehydration, hypotension. Currently kidney function at baseline.  Hyperlipidemia: On statin and Zetia. Will resume statin when liver function improves.check CMP tomorow  COPD/tobacco abuse: Counseled for smoking cessation. Currently stable in terms of COPD.  Obesity with OSA: BMI of 32. Uses cpap at night at home.  Hypertension: Hospital course remarkable for hypotension.  Blood pressure better controlled.  Continue beta-blocker.  Reactive mediastinal/hilar/right supraclavicular adenopathy: Incidental finding during this hospitalization. Outpatient biopsy recommended  Debility/deconditioning: PT/OT recommended SN facility on discharge..  Left upper extremity swelling: Venous ultrasound shows left upper extremity brachial vein superficial thrombus. keep the left upper extremity elevated and warm compresses.  Due to her CKD, I would avoid NSAIDs.  Acute hypoxic respiratory failure: This was likely due to atelectasis.  This has resolved.  Hypomagnesemia: Resolved.  Nutrition Problem: Increased nutrient needs Etiology: wound healing    DVT prophylaxis:Heparin [place] Code Status: Full Family Communication: None present at bedside.  Discussed with her daughter [name] over the phone yesterday. Remains inpatient appropriate because:Inpatient level of care appropriate due to severity of illness   Dispo: The patient is from: Home              Anticipated d/c is to: SNF or home with home health.              Anticipated d/c date is: 1 to 2 days.  Per [facility]  Consultants: Vascular surgery, Neurology   Procedures:TEE  Antimicrobials:  Anti-infectives (From admission, onward)   Start     Dose/Rate Route Frequency Ordered Stop    03/07/20 0000  ceFEPime (MAXIPIME) IVPB  2 g Intravenous Every 12 hours 03/07/20 0819 03/23/20 2359   03/02/20 1800  ceFEPIme (MAXIPIME) 2 g in sodium chloride 0.9 % 100 mL IVPB        2 g 200 mL/hr over 30 Minutes Intravenous Every 12 hours 03/02/20 0853     02/28/20 0546  ceFEPIme (MAXIPIME) 2 g in sodium chloride 0.9 % 100 mL IVPB  Status:  Discontinued        2 g 200 mL/hr over 30 Minutes Intravenous Every 24 hours 02/27/20 1041 03/02/20 0853   02/25/20 1400  metroNIDAZOLE (FLAGYL) tablet 500 mg  Status:  Discontinued        500 mg Oral Every 8 hours 02/25/20 1025 02/29/20 0911   02/24/20 1800  ceFEPIme (MAXIPIME) 2 g in sodium chloride 0.9 % 100 mL IVPB  Status:  Discontinued        2 g 200 mL/hr over 30 Minutes Intravenous Every 12 hours 02/24/20 0732 02/27/20 1041   02/24/20 0600  ceFEPIme (MAXIPIME) 2 g in sodium chloride 0.9 % 100 mL IVPB  Status:  Discontinued        2 g 200 mL/hr over 30 Minutes Intravenous Every 8 hours 02/24/20 0440 02/24/20 0732   02/23/20 1015  vancomycin (VANCOREADY) IVPB 1750 mg/350 mL        1,750 mg 175 mL/hr over 120 Minutes Intravenous  Once 02/23/20 1007 02/23/20 1347   02/23/20 1000  cefTRIAXone (ROCEPHIN) 2 g in sodium chloride 0.9 % 100 mL IVPB  Status:  Discontinued        2 g 200 mL/hr over 30 Minutes Intravenous Every 24 hours 02/23/20 0952 02/24/20 0440   02/23/20 1000  metroNIDAZOLE (FLAGYL) IVPB 500 mg  Status:  Discontinued        500 mg 100 mL/hr over 60 Minutes Intravenous Every 8 hours 02/23/20 0952 02/25/20 1025      Subjective: Patient seen and examined.  She was sitting at the edge of the bed.  Daughter at the bedside.  Patient was alert and oriented but very emotional.  Started crying as she was missing her cat and wants to go home just because of that.  Objective: Vitals:   03/13/20 0100 03/13/20 0136 03/13/20 0557 03/13/20 0805  BP:  99/68 118/90 (!) 140/101  Pulse:  74  89  Resp: 19 17 20    Temp:   98.6 F (37  C)   TempSrc:   Oral   SpO2:  100% 100%   Weight:      Height:        Intake/Output Summary (Last 24 hours) at 03/13/2020 1122 Last data filed at 03/13/2020 1000 Gross per 24 hour  Intake 900 ml  Output 800 ml  Net 100 ml   Filed Weights   02/23/20 0937 03/04/20 0741  Weight: 90.3 kg 86.2 kg    Examination:  General exam: Appears emotional and frustrated Respiratory system: Clear to auscultation. Respiratory effort normal. Cardiovascular system: S1 & S2 heard, RRR. No JVD, murmurs, rubs, gallops or clicks. No pedal edema. Gastrointestinal system: Abdomen is nondistended, soft and nontender. No organomegaly or masses felt. Normal bowel sounds heard. Central nervous system: Alert and oriented. No focal neurological deficits. Extremities: Symmetric 5 x 5 power. Skin: No rashes, lesions or ulcers.  Psychiatry: Judgement and insight appear normal. Mood & affect appropriate.    Data Reviewed: I have personally reviewed following labs and imaging studies  CBC: Recent Labs  Lab 03/08/20 0650 03/11/20 0240 03/12/20 0659 03/13/20  0622  WBC 8.7 7.1 7.5 6.0  NEUTROABS 5.8 4.1 4.1 3.1  HGB 10.3* 8.5* 9.6* 9.9*  HCT 34.6* 28.3* 32.3* 33.1*  MCV 82.4 80.6 81.0 82.8  PLT 361 320 344 809   Basic Metabolic Panel: Recent Labs  Lab 03/09/20 0721 03/10/20 0631 03/11/20 0240 03/12/20 0659 03/13/20 0622  NA 138 143 140 138 139  K 3.7 3.3* 3.3* 4.3 4.1  CL 103 106 103 102 102  CO2 28 30 30 24 29   GLUCOSE 191* 55* 201* 252* 126*  BUN 20 17 18  21* 23*  CREATININE 1.28* 1.14* 1.17* 1.38* 1.29*  CALCIUM 8.4* 8.4* 8.0* 8.6* 8.8*  MG  --   --  1.2* 1.9  --    GFR: Estimated Creatinine Clearance: 52.6 mL/min (A) (by C-G formula based on SCr of 1.29 mg/dL (H)). Liver Function Tests: Recent Labs  Lab 03/10/20 0631 03/11/20 0240 03/12/20 0659 03/13/20 0622  AST 38 44* 31 24  ALT 61* 55* 49* 41  ALKPHOS 227* 220* 220* 212*  BILITOT 0.5 0.5 0.5 0.5  PROT 6.4* 5.9* 6.6 6.6   ALBUMIN 1.9* 1.8* 2.0* 1.9*   No results for input(s): LIPASE, AMYLASE in the last 168 hours. No results for input(s): AMMONIA in the last 168 hours. Coagulation Profile: No results for input(s): INR, PROTIME in the last 168 hours. Cardiac Enzymes: No results for input(s): CKTOTAL, CKMB, CKMBINDEX, TROPONINI in the last 168 hours. BNP (last 3 results) Recent Labs    12/19/19 1546 02/09/20 0825  PROBNP 1,148* 1,185*   HbA1C: No results for input(s): HGBA1C in the last 72 hours. CBG: Recent Labs  Lab 03/11/20 2026 03/12/20 1253 03/12/20 1628 03/12/20 2144 03/13/20 0722  GLUCAP 203* 268* 333* 225* 115*   Lipid Profile: No results for input(s): CHOL, HDL, LDLCALC, TRIG, CHOLHDL, LDLDIRECT in the last 72 hours. Thyroid Function Tests: No results for input(s): TSH, T4TOTAL, FREET4, T3FREE, THYROIDAB in the last 72 hours. Anemia Panel: No results for input(s): VITAMINB12, FOLATE, FERRITIN, TIBC, IRON, RETICCTPCT in the last 72 hours. Sepsis Labs: No results for input(s): PROCALCITON, LATICACIDVEN in the last 168 hours.  Recent Results (from the past 240 hour(s))  Culture, blood (routine x 2)     Status: None   Collection Time: 03/05/20  2:37 PM   Specimen: BLOOD LEFT HAND  Result Value Ref Range Status   Specimen Description BLOOD LEFT HAND  Final   Special Requests   Final    BOTTLES DRAWN AEROBIC ONLY Blood Culture results may not be optimal due to an inadequate volume of blood received in culture bottles   Culture   Final    NO GROWTH 5 DAYS Performed at [facility], [place] [address], [place], [place] [address]    Report Status 03/10/2020 FINAL  Final  Culture, blood (routine x 2)     Status: None   Collection Time: 03/05/20  2:37 PM   Specimen: BLOOD LEFT HAND  Result Value Ref Range Status   Specimen Description BLOOD LEFT HAND  Final   Special Requests   Final    BOTTLES DRAWN AEROBIC ONLY Blood Culture results may not be optimal due to an inadequate  volume of blood received in culture bottles   Culture   Final    NO GROWTH 5 DAYS Performed at [facility], [place] [address], [place], [place] [address]    Report Status 03/10/2020 FINAL  Final  Respiratory Panel by RT PCR (Flu A&B, Covid) -  Nasopharyngeal Swab     Status: None   Collection Time: 03/05/20 10:16 PM   Specimen: Nasopharyngeal Swab  Result Value Ref Range Status   SARS Coronavirus 2 by RT PCR NEGATIVE NEGATIVE Final    Comment: (NOTE) SARS-CoV-2 target nucleic acids are NOT DETECTED.  The SARS-CoV-2 RNA is generally detectable in upper respiratoy specimens during the acute phase of infection. The lowest concentration of SARS-CoV-2 viral copies this assay can detect is 131 copies/mL. A negative result does not preclude SARS-Cov-2 infection and should not be used as the sole basis for treatment or other patient management decisions. A negative result may occur with  improper specimen collection/handling, submission of specimen other than nasopharyngeal swab, presence of viral mutation(s) within the areas targeted by this assay, and inadequate number of viral copies (<131 copies/mL). A negative result must be combined with clinical observations, patient history, and epidemiological information. The expected result is Negative.  Fact Sheet for Patients:  PinkCheek.be  Fact Sheet for Healthcare Providers:  GravelBags.it  This test is no t yet approved or cleared by the [place] FDA and  has been authorized for detection and/or diagnosis of SARS-CoV-2 by FDA under an Emergency Use Authorization (EUA). This EUA will remain  in effect (meaning this test can be used) for the duration of the COVID-19 declaration under Section 564(b)(1) of the Act, 21 U.S.C. section 360bbb-3(b)(1), unless the authorization is terminated or revoked sooner.     Influenza A by PCR NEGATIVE NEGATIVE Final   Influenza B by PCR  NEGATIVE NEGATIVE Final    Comment: (NOTE) The Xpert Xpress SARS-CoV-2/FLU/RSV assay is intended as an aid in  the diagnosis of influenza from Nasopharyngeal swab specimens and  should not be used as a sole basis for treatment. Nasal washings and  aspirates are unacceptable for Xpert Xpress SARS-CoV-2/FLU/RSV  testing.  Fact Sheet for Patients: PinkCheek.be  Fact Sheet for Healthcare Providers: GravelBags.it  This test is not yet approved or cleared by the [place] FDA and  has been authorized for detection and/or diagnosis of SARS-CoV-2 by  FDA under an Emergency Use Authorization (EUA). This EUA will remain  in effect (meaning this test can be used) for the duration of the  Covid-19 declaration under Section 564(b)(1) of the Act, 21  U.S.C. section 360bbb-3(b)(1), unless the authorization is  terminated or revoked. Performed at [facility], [place] [address] [place], [place] [address]   Respiratory Panel by RT PCR (Flu A&B, Covid) - Nasopharyngeal Swab     Status: None   Collection Time: 03/09/20  9:10 AM   Specimen: Nasopharyngeal Swab  Result Value Ref Range Status   SARS Coronavirus 2 by RT PCR NEGATIVE NEGATIVE Final    Comment: (NOTE) SARS-CoV-2 target nucleic acids are NOT DETECTED.  The SARS-CoV-2 RNA is generally detectable in upper respiratoy specimens during the acute phase of infection. The lowest concentration of SARS-CoV-2 viral copies this assay can detect is 131 copies/mL. A negative result does not preclude SARS-Cov-2 infection and should not be used as the sole basis for treatment or other patient management decisions. A negative result may occur with  improper specimen collection/handling, submission of specimen other than nasopharyngeal swab, presence of viral mutation(s) within the areas targeted by this assay, and inadequate number of viral copies (<131 copies/mL). A negative result must  be combined with clinical observations, patient history, and epidemiological information. The expected result is Negative.  Fact Sheet for Patients:  PinkCheek.be  Fact Sheet for  Healthcare Providers:  GravelBags.it  This test is no t yet approved or cleared by the [place] and  has been authorized for detection and/or diagnosis of SARS-CoV-2 by FDA under an Emergency Use Authorization (EUA). This EUA will remain  in effect (meaning this test can be used) for the duration of the COVID-19 declaration under Section 564(b)(1) of the Act, 21 U.S.C. section 360bbb-3(b)(1), unless the authorization is terminated or revoked sooner.     Influenza A by PCR NEGATIVE NEGATIVE Final   Influenza B by PCR NEGATIVE NEGATIVE Final    Comment: (NOTE) The Xpert Xpress SARS-CoV-2/FLU/RSV assay is intended as an aid in  the diagnosis of influenza from Nasopharyngeal swab specimens and  should not be used as a sole basis for treatment. Nasal washings and  aspirates are unacceptable for Xpert Xpress SARS-CoV-2/FLU/RSV  testing.  Fact Sheet for Patients: PinkCheek.be  Fact Sheet for Healthcare Providers: GravelBags.it  This test is not yet approved or cleared by the [place] FDA and  has been authorized for detection and/or diagnosis of SARS-CoV-2 by  FDA under an Emergency Use Authorization (EUA). This EUA will remain  in effect (meaning this test can be used) for the duration of the  Covid-19 declaration under Section 564(b)(1) of the Act, 21  U.S.C. section 360bbb-3(b)(1), unless the authorization is  terminated or revoked. Performed at [facility], [place] [address], [place], [place] [address]          Radiology Studies: DG CHEST PORT 1 VIEW  Result Date: 03/11/2020 CLINICAL DATA:  Hypoxia. EXAM: PORTABLE CHEST 1 VIEW COMPARISON:  02/27/2020 FINDINGS:  There is artifact from multiple external objects overlying the chest including a round radiopaque object obscuring the right lung base. A left PICC remains in place terminating over the SVC. The cardiac silhouette remains enlarged. There is persistent pulmonary vascular congestion with mixed interstitial and airspace opacities bilaterally, greatest in the lung bases and increased from the prior study. No large pleural effusion or pneumothorax is identified. No acute osseous abnormality is seen. IMPRESSION: Worsening bilateral lung opacities compatible with edema. Electronically Signed   By: Logan Bores M.D.   On: 03/11/2020 14:37   VAS [place] UPPER EXTREMITY VENOUS DUPLEX  Result Date: 03/12/2020 UPPER VENOUS STUDY  Indications: Pain, and Swelling Risk Factors: Obesity. Performing Technologist: Griffin Basil RCT RDMS  Examination Guidelines: A complete evaluation includes B-mode imaging, spectral Doppler, color Doppler, and power Doppler as needed of all accessible portions of each vessel. Bilateral testing is considered an integral part of a complete examination. Limited examinations for reoccurring indications may be performed as noted.  Left Findings: +----------+------------+---------+-----------+----------+--------------------+ LEFT      CompressiblePhasicitySpontaneousProperties      Summary        +----------+------------+---------+-----------+----------+--------------------+ IJV           Full       Yes       Yes                                   +----------+------------+---------+-----------+----------+--------------------+ Subclavian    Full       Yes       Yes                                   +----------+------------+---------+-----------+----------+--------------------+ Axillary      Full  Yes       Yes                                   +----------+------------+---------+-----------+----------+--------------------+ Brachial      Full       Yes       Yes                                    +----------+------------+---------+-----------+----------+--------------------+ Radial        Full                                                       +----------+------------+---------+-----------+----------+--------------------+ Ulnar         Full                                                       +----------+------------+---------+-----------+----------+--------------------+ Cephalic      None                                     Mid upper Arm                                                               Thrombus       +----------+------------+---------+-----------+----------+--------------------+ Basilic       Full                                                       +----------+------------+---------+-----------+----------+--------------------+  Summary:  Right: No evidence of thrombosis in the subclavian.  Left: No evidence of deep vein thrombosis in the upper extremity. Findings consistent with acute superficial vein thrombosis involving the left cephalic vein.  *See table(s) above for measurements and observations.    Preliminary         Scheduled Meds: . amitriptyline  100 mg Oral QHS  . Chlorhexidine Gluconate Cloth  6 each Topical Daily  . clopidogrel  75 mg Oral Daily  . collagenase   Topical Daily  . fluticasone furoate-vilanterol  1 puff Inhalation Daily  . furosemide  40 mg Intravenous BID  . gabapentin  400 mg Oral TID  . heparin injection (subcutaneous)  5,000 Units Subcutaneous Q8H  . insulin aspart  0-15 Units Subcutaneous TID WC  . insulin aspart  0-5 Units Subcutaneous QHS  . insulin aspart  2 Units Subcutaneous TID WC  . insulin glargine  20 Units Subcutaneous Daily  . lipase/protease/amylase  72,000 Units Oral TID WC  . metoprolol succinate  50 mg Oral Daily  . multivitamin with minerals  1 tablet Oral Daily  .  nicotine  7 mg Transdermal Daily  . pantoprazole  40 mg Oral Daily  . QUEtiapine  50 mg Oral  QHS   Continuous Infusions: . ceFEPime (MAXIPIME) IV 2 g (03/13/20 7530)  . lactated ringers 10 mL/hr at 03/04/20 0756     LOS: 19 days   Time spent: 31 mins.More than 50% of that time was spent in counseling and/or coordination of care.  Darliss Cheney, MD [facility] P11/13/2021, 11:22 AM

## 2020-03-14 DIAGNOSIS — R6521 Severe sepsis with septic shock: Secondary | ICD-10-CM | POA: Diagnosis not present

## 2020-03-14 DIAGNOSIS — A419 Sepsis, unspecified organism: Secondary | ICD-10-CM | POA: Diagnosis not present

## 2020-03-14 LAB — CBC WITH DIFFERENTIAL/PLATELET
Abs Immature Granulocytes: 0.02 10*3/uL (ref 0.00–0.07)
Basophils Absolute: 0 10*3/uL (ref 0.0–0.1)
Basophils Relative: 0 %
Eosinophils Absolute: 0.3 10*3/uL (ref 0.0–0.5)
Eosinophils Relative: 4 %
HCT: 31.6 % — ABNORMAL LOW (ref 36.0–46.0)
Hemoglobin: 9.6 g/dL — ABNORMAL LOW (ref 12.0–15.0)
Immature Granulocytes: 0 %
Lymphocytes Relative: 20 %
Lymphs Abs: 1.8 10*3/uL (ref 0.7–4.0)
MCH: 24.3 pg — ABNORMAL LOW (ref 26.0–34.0)
MCHC: 30.4 g/dL (ref 30.0–36.0)
MCV: 80 fL (ref 80.0–100.0)
Monocytes Absolute: 0.8 10*3/uL (ref 0.1–1.0)
Monocytes Relative: 10 %
Neutro Abs: 5.7 10*3/uL (ref 1.7–7.7)
Neutrophils Relative %: 66 %
Platelets: 326 10*3/uL (ref 150–400)
RBC: 3.95 MIL/uL (ref 3.87–5.11)
RDW: 19.9 % — ABNORMAL HIGH (ref 11.5–15.5)
WBC: 8.6 10*3/uL (ref 4.0–10.5)
nRBC: 0.5 % — ABNORMAL HIGH (ref 0.0–0.2)

## 2020-03-14 LAB — GLUCOSE, CAPILLARY
Glucose-Capillary: 133 mg/dL — ABNORMAL HIGH (ref 70–99)
Glucose-Capillary: 202 mg/dL — ABNORMAL HIGH (ref 70–99)
Glucose-Capillary: 236 mg/dL — ABNORMAL HIGH (ref 70–99)
Glucose-Capillary: 294 mg/dL — ABNORMAL HIGH (ref 70–99)
Glucose-Capillary: 331 mg/dL — ABNORMAL HIGH (ref 70–99)

## 2020-03-14 MED ORDER — INSULIN GLARGINE 100 UNIT/ML ~~LOC~~ SOLN
30.0000 [IU] | Freq: Every day | SUBCUTANEOUS | Status: DC
  Administered 2020-03-15: 30 [IU] via SUBCUTANEOUS
  Filled 2020-03-14: qty 0.3

## 2020-03-14 NOTE — Progress Notes (Signed)
Patient refused wound dressing to left foot.

## 2020-03-14 NOTE — Progress Notes (Signed)
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 She was found to have sepsis secondary to Serratia bacteremia from left lower extremity cellulitis with complicated chronic nonhealing ulcer. Hospital course remarkable for persistent lactic acidosis concerning for critical lower extremity vascular ischemia. Vascular surgery consulted and she was transferred to [facility]. During this hospitalization, she developed acute nonhemorrhagic stroke, also developed AKI and shock liver due to hypotension. Neurology following for stroke.Underwnt TEE on 11.4.21. Hospital course remarkable for persistent encephalopathy but now the mental status has improved but remains intermittently agitated.  TEE showed pulmonary valve vegetation.  Plan to continue cefepime till 03/21/2020.  PT/OT recommended skilled nursing  facility on discharge.  Plan was for discharge on 03/09/2020 but she was agitated and had to be restrained.  Assessment & Plan:   Principal Problem:   Septic shock ([place]) Active Problems:   Uncontrolled diabetes mellitus with circulatory complication, with long-term current use of insulin (HCC)   COPD (chronic obstructive pulmonary disease) (HCC)   PVD (peripheral vascular disease) (HCC)   Ischemia of left lower  extremity   Chronic systolic CHF (congestive heart failure) (HCC)   Obesity (BMI 30-39.9)   Cerebral embolism with cerebral infarction   Septic shock/gram-negative bacteremia: In the setting of  bilateral lower extremity peripheral artery disease with chronic left lower extremity nonhealing ulcer diabetic patient. Hospital course remarkable for shock, AKI.  Blood culture from 02/23/2020 + however from 03/05/2020 are negative to date.  She was treated with IV fluids. Sepsis physiology has resolved. Continue current antibiotics and wound care. ID also consulted for finding of possible pulmonary valve vegetation.  They recommended to continue cefepime through 03/21/2020.  Hypokalemia: Resolved.  Acute nonhemorrhagic stroke: Hospital course remarkable for encephalopathy secondary to acute right occipital infarct. She developed mild left arm weakness. Neurology consulted.  Stroke work-up completed. On Plavix currently. TEE showed ejection fraction of 40%, no PFO or  cardiac source of emboli but probable pulmonary valve vegetation.  Also showed catheter thrombus. PT/OT evaluation done and recommended skilled nursing facility.-  Possible pulmonary valve vegetation/catheter related vegetation versus thrombus: Uncommon scenario of finding of possible pulmonary vegetation in the setting of gram-negative bacteremia. ID was following .  Per them, no need to start anticoagulation for catheter related thrombus. No need to remove central line.  Continue cefepime till 03/21/2020.  Acute encephalopathy/agitation/aggressive behavior/delirium:  Behavioral disturbance could be associated with the stroke or underlying psychiatric issues/personality disorders.  Psychiatry was consulted and they recommended as needed Ativan and adding Trileptal if personality changes become an issue.  However 80 minutes not recommended as a treatment of delirium so I will discontinue that and continue Haldol for agitation.  Patient has been  having intermittent episodes of agitation.  Patient has not been agitated during my evaluation for last 5 days but with nurses, she has those intermittent events where she becomes agitated and  threatens staff.  Patient very emotional today.  Started crying and accusing her daughter of not treating her well because they have allowed her to stay in the hospital which she does not want.  Unfortunately, her niece died yesterday and I wish her family had not done that but they revealed this news to her last evening and patient became very agitated, emotional and tried to leave for which security was called and patient was calm down again.  Patient wants to leave because of that.  During my evaluation, patient had.  Severe she was talking irrelevant things.  Upon my fresh evaluation today, I still believe that patient does not have the capacity to make decisions for herself.  She is under IVC at this point in time.  I was also informed by the nurses that family called yesterday and they were coming to pick her up.  I tried calling daughter this morning and had to leave voicemail.  I called again now, daughter was at the bedside.  I went at the bedside again to talk to the daughter, patient and patient's primary nurse.  Patient's another concern was that she is not being allowed to walk outside the room.  I assured her that I have placed the orders for her to be walked with assistance in the hall 3 times a day if she wants.  That satisfied the patient.  I also verified with patient's daughter [name] that they also agree with the treatment that we are doing.  I also reiterated the IVC status of the patient.  [name] understood and she is on the page with medical staff and agrees with the current plan of care.  She told me that there is no plan for the family to take her home with home health and that they prefer for the patient to go to SNF to complete therapy and rehabilitation.  [name] also understood that while patient is  under IVC, family cannot take her home.  Patient's primary RN Danton Sewer was witness to this conversation.  Peripheral artery disease/nonhealing lower extremity ulcer: Vascular surgery following. She is status post left femoropopliteal bypass on 10/19/2019. On Plavix and statin at home.  Vascular surgery ordered ABI/lower extremity arterial duplex for outpatient follow up.  Chronic combined systolic/diastolic congestive heart failure: Echo done few months ago showed ejection fraction of 40 to 40% with global hypokinesis. Elevated BNP. She received fluids during this hospitalization for septic shock.  She still looks volume overloaded with some edema in lower extremities and left upper extremity.  We will switch her from oral Lasix to IV Lasix 40 mg twice daily.  Prolonged QTc: Last EKG showed prolonged QT.Will check repeat EKG  Elevated liver enzymes: Most likely from shock liver from hypotension. Right upper quadrant ultrasound showed nonspecific changes. She was seen by general surgery, no plan for intervention. Acute hepatitis panel negative. Right upper quadrant liver Doppler stable. Questionable early NASH on right upper quadrant ultrasound.Liver enzymes improved.  Diabetes type 2/uncontrolled hyperglycemia: Hemoglobin A1c of 15.  Now hyperglycemic.  Increase Lantus to 20 units from 15 units and continue SSI.  Anemia of chronic disease: Currently hemoglobin is stable. Continue to monitor.  AKI on CKD stage IIIa: Baseline creatinine 1.4. Hospital course remarkable for AKI secondary to contrast nephropathy, dehydration, hypotension. Currently kidney function at baseline.  Hyperlipidemia: On statin.  COPD/tobacco abuse: Counseled for smoking cessation. Currently stable in terms of COPD.  Obesity with OSA: BMI of 32. Uses cpap at night at home.  Hypertension: Hospital course remarkable for hypotension.  Fluctuating blood pressure.  Continue beta-blocker.  Reactive mediastinal/hilar/right  supraclavicular adenopathy: Incidental finding during this hospitalization. Outpatient biopsy recommended  Debility/deconditioning: PT/OT recommended SN facility on discharge..  Left upper extremity swelling: Venous ultrasound shows left upper extremity brachial vein superficial thrombus. keep the left upper extremity elevated and warm compresses.  Due to her CKD, I would avoid NSAIDs.  Acute hypoxic respiratory failure: This was likely due to atelectasis.  This has resolved.  Hypomagnesemia: Resolved.  Nutrition Problem: Increased nutrient needs Etiology: wound healing    DVT prophylaxis:Heparin Rich Code Status: Full Family Communication: None present at bedside.  Discussed with her daughter [name] over the phone yesterday. Remains inpatient appropriate because:Inpatient level of care appropriate due to severity of illness   Dispo: The patient is from: Home              Anticipated d/c is to: SNF or home with home health.              Anticipated d/c date is: 1 to 2 days.  Per [facility]  Consultants: Vascular surgery, Neurology   Procedures:TEE  Antimicrobials:  Anti-infectives (From admission, onward)   Start     Dose/Rate Route Frequency Ordered Stop   03/07/20 0000  ceFEPime (MAXIPIME) IVPB        2 g Intravenous Every 12 hours 03/07/20 0819 03/23/20 2359   03/02/20 1800  ceFEPIme (MAXIPIME) 2 g in sodium chloride 0.9 % 100 mL IVPB        2 g 200 mL/hr over 30 Minutes Intravenous Every 12 hours 03/02/20 0853     02/28/20 0546  ceFEPIme (MAXIPIME) 2 g in sodium chloride 0.9 % 100 mL IVPB  Status:  Discontinued        2 g 200 mL/hr over 30 Minutes Intravenous Every 24 hours 02/27/20 1041 03/02/20 0853   02/25/20 1400  metroNIDAZOLE (FLAGYL) tablet 500 mg  Status:  Discontinued        500 mg Oral Every 8 hours 02/25/20 1025 02/29/20 0911   02/24/20 1800  ceFEPIme (MAXIPIME) 2 g in sodium chloride 0.9 % 100 mL IVPB  Status:  Discontinued        2 g 200 mL/hr over 30 Minutes  Intravenous Every 12 hours 02/24/20 0732 02/27/20 1041   02/24/20 0600  ceFEPIme (MAXIPIME) 2 g in sodium chloride 0.9 % 100 mL IVPB  Status:  Discontinued        2 g 200 mL/hr over 30 Minutes Intravenous Every 8 hours 02/24/20 0440 02/24/20 0732   02/23/20 1015  vancomycin (VANCOREADY) IVPB 1750 mg/350 mL        1,750 mg 175 mL/hr over 120 Minutes Intravenous  Once 02/23/20 1007 02/23/20 1347   02/23/20 1000  cefTRIAXone (ROCEPHIN) 2 g in sodium chloride 0.9 % 100 mL IVPB  Status:  Discontinued        2 g 200 mL/hr over 30 Minutes Intravenous Every 24 hours 02/23/20 0952 02/24/20 0440   02/23/20 1000  metroNIDAZOLE (FLAGYL) IVPB 500 mg  Status:  Discontinued        500 mg 100 mL/hr over 60 Minutes Intravenous Every 8 hours 02/23/20 0952 02/25/20 1025      Subjective:  Patient seen and examined early this morning.  She was sitting at the edge of the bed.  She was emotional and started crying due to the death of her niece.  She also started accusing her daughters of not treating her  well because they have allowed medical staff to treat her and keep her in the hospital.  Patient did not endorse any complaint.  Objective: Vitals:   03/14/20 0556 03/14/20 0800 03/14/20 0840 03/14/20 1147  BP: (!) 80/60 (!) 178/143 133/67   Pulse: (!) 108 84 93 90  Resp:      Temp: 98.1 F (36.7 C)   (!) 97.5 F (36.4 C)  TempSrc: Oral   Oral  SpO2: (!) 82%  94%   Weight:      Height:        Intake/Output Summary (Last 24 hours) at 03/14/2020 1355 Last data filed at 03/14/2020 1300 Gross per 24 hour  Intake 740 ml  Output 600 ml  Net 140 ml   Filed Weights   02/23/20 0937 03/04/20 0741  Weight: 90.3 kg 86.2 kg    Examination:  General exam: Appears calm and comfortable but very emotional and crying Respiratory system: Clear to auscultation. Respiratory effort normal. Cardiovascular system: S1 & S2 heard, RRR. No JVD, murmurs, rubs, gallops or clicks. No pedal edema. Gastrointestinal  system: Abdomen is nondistended, soft and nontender. No organomegaly or masses felt. Normal bowel sounds heard. Central nervous system: Alert and oriented. No focal neurological deficits. Extremities: Symmetric 5 x 5 power. Skin: Ulcer on the dorsum of the right foot. Psychiatry: Judgement and insight appear poor. Mood & affect very emotional   Data Reviewed: I have personally reviewed following labs and imaging studies  CBC: Recent Labs  Lab 03/08/20 0650 03/11/20 0240 03/12/20 0659 03/13/20 0622 03/14/20 0540  WBC 8.7 7.1 7.5 6.0 8.6  NEUTROABS 5.8 4.1 4.1 3.1 5.7  HGB 10.3* 8.5* 9.6* 9.9* 9.6*  HCT 34.6* 28.3* 32.3* 33.1* 31.6*  MCV 82.4 80.6 81.0 82.8 80.0  PLT 361 320 344 343 161   Basic Metabolic Panel: Recent Labs  Lab 03/09/20 0721 03/10/20 0631 03/11/20 0240 03/12/20 0659 03/13/20 0622  NA 138 143 140 138 139  K 3.7 3.3* 3.3* 4.3 4.1  CL 103 106 103 102 102  CO2 28 30 30 24 29   GLUCOSE 191* 55* 201* 252* 126*  BUN 20 17 18  21* 23*  CREATININE 1.28* 1.14* 1.17* 1.38* 1.29*  CALCIUM 8.4* 8.4* 8.0* 8.6* 8.8*  MG  --   --  1.2* 1.9  --    GFR: Estimated Creatinine Clearance: 52.6 mL/min (A) (by C-G formula based on SCr of 1.29 mg/dL (H)). Liver Function Tests: Recent Labs  Lab 03/10/20 0631 03/11/20 0240 03/12/20 0659 03/13/20 0622  AST 38 44* 31 24  ALT 61* 55* 49* 41  ALKPHOS 227* 220* 220* 212*  BILITOT 0.5 0.5 0.5 0.5  PROT 6.4* 5.9* 6.6 6.6  ALBUMIN 1.9* 1.8* 2.0* 1.9*   No results for input(s): LIPASE, AMYLASE in the last 168 hours. No results for input(s): AMMONIA in the last 168 hours. Coagulation Profile: No results for input(s): INR, PROTIME in the last 168 hours. Cardiac Enzymes: No results for input(s): CKTOTAL, CKMB, CKMBINDEX, TROPONINI in the last 168 hours. BNP (last 3 results) Recent Labs    12/19/19 1546 02/09/20 0825  PROBNP 1,148* 1,185*   HbA1C: No results for input(s): HGBA1C in the last 72 hours. CBG: Recent Labs   Lab 03/13/20 1146 03/13/20 1629 03/13/20 2313 03/14/20 0721 03/14/20 1136  GLUCAP 215* 289* 306* 236* 202*   Lipid Profile: No results for input(s): CHOL, HDL, LDLCALC, TRIG, CHOLHDL, LDLDIRECT in the last 72 hours. Thyroid Function Tests: No results for input(s): TSH, T4TOTAL,  FREET4, T3FREE, THYROIDAB in the last 72 hours. Anemia Panel: No results for input(s): VITAMINB12, FOLATE, FERRITIN, TIBC, IRON, RETICCTPCT in the last 72 hours. Sepsis Labs: No results for input(s): PROCALCITON, LATICACIDVEN in the last 168 hours.  Recent Results (from the past 240 hour(s))  Culture, blood (routine x 2)     Status: None   Collection Time: 03/05/20  2:37 PM   Specimen: BLOOD LEFT HAND  Result Value Ref Range Status   Specimen Description BLOOD LEFT HAND  Final   Special Requests   Final    BOTTLES DRAWN AEROBIC ONLY Blood Culture results may not be optimal due to an inadequate volume of blood received in culture bottles   Culture   Final    NO GROWTH 5 DAYS Performed at [facility], [place] [address], [place], [place] [address]    Report Status 03/10/2020 FINAL  Final  Culture, blood (routine x 2)     Status: None   Collection Time: 03/05/20  2:37 PM   Specimen: BLOOD LEFT HAND  Result Value Ref Range Status   Specimen Description BLOOD LEFT HAND  Final   Special Requests   Final    BOTTLES DRAWN AEROBIC ONLY Blood Culture results may not be optimal due to an inadequate volume of blood received in culture bottles   Culture   Final    NO GROWTH 5 DAYS Performed at [facility], [place] [address] [place], [place] [address]    Report Status 03/10/2020 FINAL  Final  Respiratory Panel by RT PCR (Flu A&B, Covid) - Nasopharyngeal Swab     Status: None   Collection Time: 03/05/20 10:16 PM   Specimen: Nasopharyngeal Swab  Result Value Ref Range Status   SARS Coronavirus 2 by RT PCR NEGATIVE NEGATIVE Final    Comment: (NOTE) SARS-CoV-2 target nucleic acids are NOT  DETECTED.  The SARS-CoV-2 RNA is generally detectable in upper respiratoy specimens during the acute phase of infection. The lowest concentration of SARS-CoV-2 viral copies this assay can detect is 131 copies/mL. A negative result does not preclude SARS-Cov-2 infection and should not be used as the sole basis for treatment or other patient management decisions. A negative result may occur with  improper specimen collection/handling, submission of specimen other than nasopharyngeal swab, presence of viral mutation(s) within the areas targeted by this assay, and inadequate number of viral copies (<131 copies/mL). A negative result must be combined with clinical observations, patient history, and epidemiological information. The expected result is Negative.  Fact Sheet for Patients:  PinkCheek.be  Fact Sheet for Healthcare Providers:  GravelBags.it  This test is no t yet approved or cleared by the [place] FDA and  has been authorized for detection and/or diagnosis of SARS-CoV-2 by FDA under an Emergency Use Authorization (EUA). This EUA will remain  in effect (meaning this test can be used) for the duration of the COVID-19 declaration under Section 564(b)(1) of the Act, 21 U.S.C. section 360bbb-3(b)(1), unless the authorization is terminated or revoked sooner.     Influenza A by PCR NEGATIVE NEGATIVE Final   Influenza B by PCR NEGATIVE NEGATIVE Final    Comment: (NOTE) The Xpert Xpress SARS-CoV-2/FLU/RSV assay is intended as an aid in  the diagnosis of influenza from Nasopharyngeal swab specimens and  should not be used as a sole basis for treatment. Nasal washings and  aspirates are unacceptable for Xpert Xpress SARS-CoV-2/FLU/RSV  testing.  Fact Sheet for Patients: PinkCheek.be  Fact Sheet for Healthcare  Providers: GravelBags.it  This test is not yet  approved or cleared by the [place] and  has been authorized for detection and/or diagnosis of SARS-CoV-2 by  FDA under an Emergency Use Authorization (EUA). This EUA will remain  in effect (meaning this test can be used) for the duration of the  Covid-19 declaration under Section 564(b)(1) of the Act, 21  U.S.C. section 360bbb-3(b)(1), unless the authorization is  terminated or revoked. Performed at [facility], [place] [address], [place], [place] [address]   Respiratory Panel by RT PCR (Flu A&B, Covid) - Nasopharyngeal Swab     Status: None   Collection Time: 03/09/20  9:10 AM   Specimen: Nasopharyngeal Swab  Result Value Ref Range Status   SARS Coronavirus 2 by RT PCR NEGATIVE NEGATIVE Final    Comment: (NOTE) SARS-CoV-2 target nucleic acids are NOT DETECTED.  The SARS-CoV-2 RNA is generally detectable in upper respiratoy specimens during the acute phase of infection. The lowest concentration of SARS-CoV-2 viral copies this assay can detect is 131 copies/mL. A negative result does not preclude SARS-Cov-2 infection and should not be used as the sole basis for treatment or other patient management decisions. A negative result may occur with  improper specimen collection/handling, submission of specimen other than nasopharyngeal swab, presence of viral mutation(s) within the areas targeted by this assay, and inadequate number of viral copies (<131 copies/mL). A negative result must be combined with clinical observations, patient history, and epidemiological information. The expected result is Negative.  Fact Sheet for Patients:  PinkCheek.be  Fact Sheet for Healthcare Providers:  GravelBags.it  This test is no t yet approved or cleared by the [place] FDA and  has been authorized for detection and/or diagnosis of SARS-CoV-2 by FDA under an Emergency Use Authorization (EUA). This EUA will remain  in  effect (meaning this test can be used) for the duration of the COVID-19 declaration under Section 564(b)(1) of the Act, 21 U.S.C. section 360bbb-3(b)(1), unless the authorization is terminated or revoked sooner.     Influenza A by PCR NEGATIVE NEGATIVE Final   Influenza B by PCR NEGATIVE NEGATIVE Final    Comment: (NOTE) The Xpert Xpress SARS-CoV-2/FLU/RSV assay is intended as an aid in  the diagnosis of influenza from Nasopharyngeal swab specimens and  should not be used as a sole basis for treatment. Nasal washings and  aspirates are unacceptable for Xpert Xpress SARS-CoV-2/FLU/RSV  testing.  Fact Sheet for Patients: PinkCheek.be  Fact Sheet for Healthcare Providers: GravelBags.it  This test is not yet approved or cleared by the [place] FDA and  has been authorized for detection and/or diagnosis of SARS-CoV-2 by  FDA under an Emergency Use Authorization (EUA). This EUA will remain  in effect (meaning this test can be used) for the duration of the  Covid-19 declaration under Section 564(b)(1) of the Act, 21  U.S.C. section 360bbb-3(b)(1), unless the authorization is  terminated or revoked. Performed at [facility], [place] [address] [place], [place] [address]          Radiology Studies: No results found.      Scheduled Meds: . amitriptyline  100 mg Oral QHS  . atorvastatin  20 mg Oral Daily  . Chlorhexidine Gluconate Cloth  6 each Topical Daily  . clopidogrel  75 mg Oral Daily  . collagenase   Topical Daily  . fluticasone furoate-vilanterol  1 puff Inhalation Daily  . furosemide  40 mg Intravenous BID  . gabapentin  400 mg Oral TID  . heparin injection (subcutaneous)  5,000 Units Subcutaneous Q8H  . insulin aspart  0-15 Units Subcutaneous TID WC  . insulin aspart  0-5 Units Subcutaneous QHS  . insulin aspart  2 Units Subcutaneous TID WC  . [START ON 03/15/2020] insulin glargine  30 Units  Subcutaneous Daily  . lipase/protease/amylase  72,000 Units Oral TID WC  . metoprolol succinate  50 mg Oral Daily  . multivitamin with minerals  1 tablet Oral Daily  . nicotine  7 mg Transdermal Daily  . pantoprazole  40 mg Oral Daily  . QUEtiapine  50 mg Oral QHS   Continuous Infusions: . ceFEPime (MAXIPIME) IV 2 g (03/14/20 0554)  . lactated ringers 10 mL/hr at 03/04/20 0756     LOS: 20 days   Time spent: 38 mins.More than 50% of that time was spent in counseling and/or coordination of care.  Darliss Cheney, MD [facility] P11/14/2021, 1:55 PM

## 2020-03-14 NOTE — Plan of Care (Signed)
  Problem: Clinical Measurements: Goal: Will remain free from infection Outcome: Progressing   Problem: Pain Managment: Goal: General experience of comfort will improve Outcome: Progressing   Problem: Skin Integrity: Goal: Risk for impaired skin integrity will decrease Outcome: Progressing   Problem: Education: Goal: Ability to describe self-care measures that may prevent or decrease complications (Diabetes Survival Skills Education) will improve Outcome: Progressing   Problem: Skin Integrity: Goal: Risk for impaired skin integrity will decrease Outcome: Progressing   Problem: Tissue Perfusion: Goal: Adequacy of tissue perfusion will improve Outcome: Progressing

## 2020-03-14 NOTE — Progress Notes (Signed)
Patient more cooperative this am.  Took medicine and allowed RN to place santyl on wound, does not want it wrapped.  BP very high and now very low, unable to get accurate reading. Placed patient back on tele, HR 90.  Will notify MD.

## 2020-03-15 DIAGNOSIS — A419 Sepsis, unspecified organism: Secondary | ICD-10-CM | POA: Diagnosis not present

## 2020-03-15 DIAGNOSIS — R6521 Severe sepsis with septic shock: Secondary | ICD-10-CM | POA: Diagnosis not present

## 2020-03-15 LAB — CBC WITH DIFFERENTIAL/PLATELET
Abs Immature Granulocytes: 0.01 10*3/uL (ref 0.00–0.07)
Basophils Absolute: 0 10*3/uL (ref 0.0–0.1)
Basophils Relative: 0 %
Eosinophils Absolute: 0.3 10*3/uL (ref 0.0–0.5)
Eosinophils Relative: 4 %
HCT: 31.2 % — ABNORMAL LOW (ref 36.0–46.0)
Hemoglobin: 9.5 g/dL — ABNORMAL LOW (ref 12.0–15.0)
Immature Granulocytes: 0 %
Lymphocytes Relative: 20 %
Lymphs Abs: 1.5 10*3/uL (ref 0.7–4.0)
MCH: 24.6 pg — ABNORMAL LOW (ref 26.0–34.0)
MCHC: 30.4 g/dL (ref 30.0–36.0)
MCV: 80.8 fL (ref 80.0–100.0)
Monocytes Absolute: 0.7 10*3/uL (ref 0.1–1.0)
Monocytes Relative: 10 %
Neutro Abs: 5 10*3/uL (ref 1.7–7.7)
Neutrophils Relative %: 66 %
Platelets: 309 10*3/uL (ref 150–400)
RBC: 3.86 MIL/uL — ABNORMAL LOW (ref 3.87–5.11)
RDW: 20.1 % — ABNORMAL HIGH (ref 11.5–15.5)
WBC: 7.5 10*3/uL (ref 4.0–10.5)
nRBC: 0.5 % — ABNORMAL HIGH (ref 0.0–0.2)

## 2020-03-15 LAB — GLUCOSE, CAPILLARY
Glucose-Capillary: 200 mg/dL — ABNORMAL HIGH (ref 70–99)
Glucose-Capillary: 248 mg/dL — ABNORMAL HIGH (ref 70–99)
Glucose-Capillary: 236 mg/dL — ABNORMAL HIGH (ref 70–99)
Glucose-Capillary: 230 mg/dL — ABNORMAL HIGH (ref 70–99)
Glucose-Capillary: 171 mg/dL — ABNORMAL HIGH (ref 70–99)

## 2020-03-15 MED ORDER — BUSPIRONE HCL 10 MG PO TABS
10.0000 mg | ORAL_TABLET | Freq: Two times a day (BID) | ORAL | Status: DC
  Administered 2020-03-15 – 2020-03-18 (×7): 10 mg via ORAL
  Filled 2020-03-15 (×7): qty 1

## 2020-03-15 MED ORDER — OLANZAPINE 2.5 MG PO TABS
2.5000 mg | ORAL_TABLET | Freq: Every evening | ORAL | Status: DC | PRN
  Administered 2020-03-17: 2.5 mg via ORAL
  Filled 2020-03-15 (×2): qty 1

## 2020-03-15 MED ORDER — INSULIN GLARGINE 100 UNIT/ML ~~LOC~~ SOLN
40.0000 [IU] | Freq: Every day | SUBCUTANEOUS | Status: DC
  Administered 2020-03-16 – 2020-03-18 (×3): 40 [IU] via SUBCUTANEOUS
  Filled 2020-03-15 (×3): qty 0.4

## 2020-03-15 MED ORDER — INSULIN GLARGINE 100 UNIT/ML ~~LOC~~ SOLN
10.0000 [IU] | Freq: Once | SUBCUTANEOUS | Status: AC
  Administered 2020-03-15: 10 [IU] via SUBCUTANEOUS
  Filled 2020-03-15: qty 0.1

## 2020-03-15 NOTE — Progress Notes (Signed)
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 She was found to have sepsis secondary to Serratia bacteremia from left lower extremity cellulitis with complicated chronic nonhealing ulcer. Hospital course remarkable for persistent lactic acidosis concerning for critical lower extremity vascular ischemia. Vascular surgery consulted and she was transferred to [facility]. During this hospitalization, she developed acute nonhemorrhagic stroke, also developed AKI and shock liver due to hypotension. Neurology following for stroke.Underwnt TEE on 11.4.21. Hospital course remarkable for persistent encephalopathy but now the mental status has improved but remains intermittently agitated.  TEE showed pulmonary valve vegetation.  Plan to continue cefepime till 03/21/2020.  PT/OT recommended skilled nursing  facility on discharge.  Plan was for discharge on 03/09/2020 but she was agitated and had to be restrained.  Assessment & Plan:   Principal Problem:   Septic shock ([place]) Active Problems:   Uncontrolled diabetes mellitus with circulatory complication, with long-term current use of insulin (HCC)   COPD (chronic obstructive pulmonary disease) (HCC)   PVD (peripheral vascular disease) (HCC)   Ischemia of left lower  extremity   Chronic systolic CHF (congestive heart failure) (HCC)   Obesity (BMI 30-39.9)   Cerebral embolism with cerebral infarction   Septic shock/gram-negative bacteremia: In the setting of  bilateral lower extremity peripheral artery disease with chronic left lower extremity nonhealing ulcer diabetic patient. Hospital course remarkable for shock, AKI.  Blood culture from 02/23/2020 + however from 03/05/2020 are negative to date.  She was treated with IV fluids. Sepsis physiology has resolved. Continue current antibiotics and wound care. ID also consulted for finding of possible pulmonary valve vegetation.  They recommended to continue cefepime through 03/21/2020.  Hypokalemia: Resolved.  Acute nonhemorrhagic stroke: Hospital course remarkable for encephalopathy secondary to acute right occipital infarct. She developed mild left arm weakness. Neurology consulted.  Stroke work-up completed. On Plavix currently. TEE showed ejection fraction of 40%, no PFO or  cardiac source of emboli but probable pulmonary valve vegetation.  Also showed catheter thrombus. PT/OT evaluation done and recommended skilled nursing facility.-  Possible pulmonary valve vegetation/catheter related vegetation versus thrombus: Uncommon scenario of finding of possible pulmonary vegetation in the setting of gram-negative bacteremia. ID was following .  Per them, no need to start anticoagulation for catheter related thrombus. No need to remove central line.  Continue cefepime till 03/21/2020.  Acute encephalopathy/agitation/aggressive behavior/delirium:  Behavioral disturbance could be associated with the stroke or underlying psychiatric issues/personality disorders.  Psychiatry was consulted and they recommended as needed Ativan and adding Trileptal if personality changes become an issue.  However 80 minutes not recommended as a treatment of delirium so I will discontinue that and continue Haldol for agitation.  Patient once  again was confused and agitated and trying to hit the staff this morning and she was given Haldol.  By the time I saw her, she was sleepy and laying in the  bed.  I will reconsult psychiatry to assess her.  Peripheral artery disease/nonhealing lower extremity ulcer: Vascular surgery following. She is status post left femoropopliteal bypass on 10/19/2019. On Plavix and statin at home.  Vascular surgery ordered ABI/lower extremity arterial duplex for outpatient follow up.  Chronic combined systolic/diastolic congestive heart failure: Echo done few months ago showed ejection fraction of 40 to 40% with global hypokinesis. Elevated BNP. She received fluids during this hospitalization for septic shock.  She still looks volume overloaded with some edema in lower extremities and left upper extremity.  We will switch her from oral Lasix to IV Lasix 40 mg twice daily.  Prolonged QTc: Last EKG showed prolonged QT.Will check repeat EKG  Elevated liver enzymes: Most likely from shock liver from hypotension. Right upper quadrant ultrasound showed nonspecific changes. She was seen by general surgery, no plan for intervention. Acute hepatitis panel negative. Right upper quadrant liver Doppler stable. Questionable early NASH on right upper quadrant ultrasound.Liver enzymes improved.  Diabetes type 2/uncontrolled hyperglycemia: Hemoglobin A1c of 15.  Still hyperglycemic so we will increase Lantus to 40 units.  Continue SSI.    Anemia of chronic disease: Currently hemoglobin is stable. Continue to monitor.  AKI on CKD stage IIIa: Baseline creatinine 1.4. Hospital course remarkable for AKI secondary to contrast nephropathy, dehydration, hypotension. Currently kidney function at baseline.  Hyperlipidemia: On statin.  COPD/tobacco abuse: Counseled for smoking cessation. Currently stable in terms of COPD.  Obesity with OSA: BMI of 32. Uses cpap at night at home.  Hypertension: Hospital course remarkable for  hypotension.  Fluctuating blood pressure.  Continue beta-blocker.  Reactive mediastinal/hilar/right supraclavicular adenopathy: Incidental finding during this hospitalization. Outpatient biopsy recommended  Debility/deconditioning: PT/OT recommended SN facility on discharge..  Left upper extremity swelling: Venous ultrasound shows left upper extremity brachial vein superficial thrombus. keep the left upper extremity elevated and warm compresses.  Due to her CKD, I would avoid NSAIDs.  It has been hard for nursing staff to keep her extremity elevated effusion.  Acute hypoxic respiratory failure: This was likely due to atelectasis.  This has resolved.  Hypomagnesemia: Resolved.  Nutrition Problem: Increased nutrient needs Etiology: wound healing    DVT prophylaxis:Heparin [place] Code Status: Full Family Communication: None present at bedside.  Discussed with her daughter [name] in person on 03/14/2020.  Please refer to detailed note on the same date. Remains inpatient appropriate because:Inpatient level of care appropriate due to severity of illness   Dispo: The patient is from: Home              Anticipated d/c is to: SNF or home with home health.              Anticipated d/c date is: 1 to 2 days.  Per [facility]  Consultants: Vascular surgery, Neurology   Procedures:TEE  Antimicrobials:  Anti-infectives (From admission, onward)   Start     Dose/Rate Route Frequency Ordered Stop   03/07/20 0000  ceFEPime (MAXIPIME) IVPB        2 g Intravenous Every 12 hours 03/07/20 0819 03/23/20 2359   03/02/20 1800  ceFEPIme (MAXIPIME) 2 g in sodium chloride 0.9 % 100 mL IVPB        2 g 200 mL/hr over 30 Minutes Intravenous Every 12 hours 03/02/20 0853     02/28/20 0546  ceFEPIme (MAXIPIME) 2 g in sodium chloride 0.9 % 100 mL IVPB  Status:  Discontinued        2 g 200 mL/hr over 30 Minutes  Intravenous Every 24 hours 02/27/20 1041 03/02/20 0853   02/25/20 1400  metroNIDAZOLE (FLAGYL) tablet 500 mg   Status:  Discontinued        500 mg Oral Every 8 hours 02/25/20 1025 02/29/20 0911   02/24/20 1800  ceFEPIme (MAXIPIME) 2 g in sodium chloride 0.9 % 100 mL IVPB  Status:  Discontinued        2 g 200 mL/hr over 30 Minutes Intravenous Every 12 hours 02/24/20 0732 02/27/20 1041   02/24/20 0600  ceFEPIme (MAXIPIME) 2 g in sodium chloride 0.9 % 100 mL IVPB  Status:  Discontinued        2 g 200 mL/hr over 30 Minutes Intravenous Every 8 hours 02/24/20 0440 02/24/20 0732   02/23/20 1015  vancomycin (VANCOREADY) IVPB 1750 mg/350 mL        1,750 mg 175 mL/hr over 120 Minutes Intravenous  Once 02/23/20 1007 02/23/20 1347   02/23/20 1000  cefTRIAXone (ROCEPHIN) 2 g in sodium chloride 0.9 % 100 mL IVPB  Status:  Discontinued        2 g 200 mL/hr over 30 Minutes Intravenous Every 24 hours 02/23/20 0952 02/24/20 0440   02/23/20 1000  metroNIDAZOLE (FLAGYL) IVPB 500 mg  Status:  Discontinued        500 mg 100 mL/hr over 60 Minutes Intravenous Every 8 hours 02/23/20 0952 02/25/20 1025      Subjective: Seen and examined.  Patient was sleepy and lethargic.  PT and OT tech were in the room.  They informed me that patient was very confused and agitated and was trying to leave.  So she was given Haldol earlier.  Objective: Vitals:   03/14/20 2000 03/14/20 2336 03/15/20 0754 03/15/20 0845  BP: (!) 141/68 95/70 137/84   Pulse: 87 87  (!) 104  Resp: 16 16  20   Temp: 98 F (36.7 C) 98 F (36.7 C) 97.8 F (36.6 C)   TempSrc: Oral Oral Oral   SpO2: 92% 92%    Weight:      Height:        Intake/Output Summary (Last 24 hours) at 03/15/2020 1120 Last data filed at 03/15/2020 0519 Gross per 24 hour  Intake 900 ml  Output --  Net 900 ml   Filed Weights   02/23/20 0937 03/04/20 0741  Weight: 90.3 kg 86.2 kg    Examination:   General exam: Sleepy now. Respiratory system: Clear to auscultation. Respiratory effort normal. Cardiovascular system: S1 & S2 heard, RRR. No JVD, murmurs, rubs, gallops or  clicks. No pedal edema. Gastrointestinal system: Abdomen is nondistended, soft and nontender. No organomegaly or masses felt. Normal bowel sounds heard. Central nervous system: Sleepy. Extremities: Symmetric 5 x 5 power.    Data Reviewed: I have personally reviewed following labs and imaging studies  CBC: Recent Labs  Lab 03/11/20 0240 03/12/20 0659 03/13/20 0622 03/14/20 0540 03/15/20 0317  WBC 7.1 7.5 6.0 8.6 7.5  NEUTROABS 4.1 4.1 3.1 5.7 5.0  HGB 8.5* 9.6* 9.9* 9.6* 9.5*  HCT 28.3* 32.3* 33.1* 31.6* 31.2*  MCV 80.6 81.0 82.8 80.0 80.8  PLT 320 344 343 326 878   Basic Metabolic Panel: Recent Labs  Lab 03/09/20 0721 03/10/20 0631 03/11/20 0240 03/12/20 0659 03/13/20 0622  NA 138 143 140 138 139  K 3.7 3.3* 3.3* 4.3 4.1  CL 103 106 103 102 102  CO2 28 30 30 24 29   GLUCOSE 191* 55* 201* 252* 126*  BUN 20 17 18  21* 23*  CREATININE 1.28* 1.14* 1.17* 1.38* 1.29*  CALCIUM 8.4* 8.4* 8.0* 8.6* 8.8*  MG  --   --  1.2* 1.9  --    GFR: Estimated Creatinine Clearance: 52.6 mL/min (A) (by C-G formula based on SCr of 1.29 mg/dL (H)). Liver Function Tests: Recent Labs  Lab 03/10/20 0631 03/11/20 0240 03/12/20 0659 03/13/20 0622  AST 38 44* 31 24  ALT 61* 55* 49* 41  ALKPHOS 227* 220* 220* 212*  BILITOT 0.5 0.5 0.5 0.5  PROT 6.4* 5.9* 6.6 6.6  ALBUMIN 1.9* 1.8* 2.0* 1.9*   No results for input(s): LIPASE, AMYLASE in the last 168 hours. No results for input(s): AMMONIA in the last 168 hours. Coagulation Profile: No results for input(s): INR, PROTIME in the last 168 hours. Cardiac Enzymes: No results for input(s): CKTOTAL, CKMB, CKMBINDEX, TROPONINI in the last 168 hours. BNP (last 3 results) Recent Labs    12/19/19 1546 02/09/20 0825  PROBNP 1,148* 1,185*   HbA1C: No results for input(s): HGBA1C in the last 72 hours. CBG: Recent Labs  Lab 03/14/20 1623 03/14/20 2151 03/14/20 2330 03/15/20 0336 03/15/20 0751  GLUCAP 133* 294* 331* 200* 248*   Lipid  Profile: No results for input(s): CHOL, HDL, LDLCALC, TRIG, CHOLHDL, LDLDIRECT in the last 72 hours. Thyroid Function Tests: No results for input(s): TSH, T4TOTAL, FREET4, T3FREE, THYROIDAB in the last 72 hours. Anemia Panel: No results for input(s): VITAMINB12, FOLATE, FERRITIN, TIBC, IRON, RETICCTPCT in the last 72 hours. Sepsis Labs: No results for input(s): PROCALCITON, LATICACIDVEN in the last 168 hours.  Recent Results (from the past 240 hour(s))  Culture, blood (routine x 2)     Status: None   Collection Time: 03/05/20  2:37 PM   Specimen: BLOOD LEFT HAND  Result Value Ref Range Status   Specimen Description BLOOD LEFT HAND  Final   Special Requests   Final    BOTTLES DRAWN AEROBIC ONLY Blood Culture results may not be optimal due to an inadequate volume of blood received in culture bottles   Culture   Final    NO GROWTH 5 DAYS Performed at [facility], [place] [address], [place], [place] [address]    Report Status 03/10/2020 FINAL  Final  Culture, blood (routine x 2)     Status: None   Collection Time: 03/05/20  2:37 PM   Specimen: BLOOD LEFT HAND  Result Value Ref Range Status   Specimen Description BLOOD LEFT HAND  Final   Special Requests   Final    BOTTLES DRAWN AEROBIC ONLY Blood Culture results may not be optimal due to an inadequate volume of blood received in culture bottles   Culture   Final    NO GROWTH 5 DAYS Performed at [facility], [place] [address], [place], [place] [address]    Report Status 03/10/2020 FINAL  Final  Respiratory Panel by RT PCR (Flu A&B, Covid) - Nasopharyngeal Swab     Status: None   Collection Time: 03/05/20 10:16 PM   Specimen: Nasopharyngeal Swab  Result Value Ref Range Status   SARS Coronavirus 2 by RT PCR NEGATIVE NEGATIVE Final    Comment: (NOTE) SARS-CoV-2 target nucleic acids are NOT DETECTED.  The SARS-CoV-2 RNA is generally detectable in upper respiratoy specimens during the acute phase of infection. The  lowest concentration of SARS-CoV-2 viral copies this assay can detect is 131 copies/mL. A negative result does not preclude SARS-Cov-2 infection and should not be used as the sole basis for treatment  or other patient management decisions. A negative result may occur with  improper specimen collection/handling, submission of specimen other than nasopharyngeal swab, presence of viral mutation(s) within the areas targeted by this assay, and inadequate number of viral copies (<131 copies/mL). A negative result must be combined with clinical observations, patient history, and epidemiological information. The expected result is Negative.  Fact Sheet for Patients:  PinkCheek.be  Fact Sheet for Healthcare Providers:  GravelBags.it  This test is no t yet approved or cleared by the [place] FDA and  has been authorized for detection and/or diagnosis of SARS-CoV-2 by FDA under an Emergency Use Authorization (EUA). This EUA will remain  in effect (meaning this test can be used) for the duration of the COVID-19 declaration under Section 564(b)(1) of the Act, 21 U.S.C. section 360bbb-3(b)(1), unless the authorization is terminated or revoked sooner.     Influenza A by PCR NEGATIVE NEGATIVE Final   Influenza B by PCR NEGATIVE NEGATIVE Final    Comment: (NOTE) The Xpert Xpress SARS-CoV-2/FLU/RSV assay is intended as an aid in  the diagnosis of influenza from Nasopharyngeal swab specimens and  should not be used as a sole basis for treatment. Nasal washings and  aspirates are unacceptable for Xpert Xpress SARS-CoV-2/FLU/RSV  testing.  Fact Sheet for Patients: PinkCheek.be  Fact Sheet for Healthcare Providers: GravelBags.it  This test is not yet approved or cleared by the [place] FDA and  has been authorized for detection and/or diagnosis of SARS-CoV-2 by  FDA under  an Emergency Use Authorization (EUA). This EUA will remain  in effect (meaning this test can be used) for the duration of the  Covid-19 declaration under Section 564(b)(1) of the Act, 21  U.S.C. section 360bbb-3(b)(1), unless the authorization is  terminated or revoked. Performed at [facility], [place] [address] [place], [place] [address]   Respiratory Panel by RT PCR (Flu A&B, Covid) - Nasopharyngeal Swab     Status: None   Collection Time: 03/09/20  9:10 AM   Specimen: Nasopharyngeal Swab  Result Value Ref Range Status   SARS Coronavirus 2 by RT PCR NEGATIVE NEGATIVE Final    Comment: (NOTE) SARS-CoV-2 target nucleic acids are NOT DETECTED.  The SARS-CoV-2 RNA is generally detectable in upper respiratoy specimens during the acute phase of infection. The lowest concentration of SARS-CoV-2 viral copies this assay can detect is 131 copies/mL. A negative result does not preclude SARS-Cov-2 infection and should not be used as the sole basis for treatment or other patient management decisions. A negative result may occur with  improper specimen collection/handling, submission of specimen other than nasopharyngeal swab, presence of viral mutation(s) within the areas targeted by this assay, and inadequate number of viral copies (<131 copies/mL). A negative result must be combined with clinical observations, patient history, and epidemiological information. The expected result is Negative.  Fact Sheet for Patients:  PinkCheek.be  Fact Sheet for Healthcare Providers:  GravelBags.it  This test is no t yet approved or cleared by the [place] FDA and  has been authorized for detection and/or diagnosis of SARS-CoV-2 by FDA under an Emergency Use Authorization (EUA). This EUA will remain  in effect (meaning this test can be used) for the duration of the COVID-19 declaration under Section 564(b)(1) of the Act, 21  U.S.C. section 360bbb-3(b)(1), unless the authorization is terminated or revoked sooner.     Influenza A by PCR NEGATIVE NEGATIVE Final   Influenza B by PCR NEGATIVE NEGATIVE Final  Comment: (NOTE) The Xpert Xpress SARS-CoV-2/FLU/RSV assay is intended as an aid in  the diagnosis of influenza from Nasopharyngeal swab specimens and  should not be used as a sole basis for treatment. Nasal washings and  aspirates are unacceptable for Xpert Xpress SARS-CoV-2/FLU/RSV  testing.  Fact Sheet for Patients: PinkCheek.be  Fact Sheet for Healthcare Providers: GravelBags.it  This test is not yet approved or cleared by the [place] FDA and  has been authorized for detection and/or diagnosis of SARS-CoV-2 by  FDA under an Emergency Use Authorization (EUA). This EUA will remain  in effect (meaning this test can be used) for the duration of the  Covid-19 declaration under Section 564(b)(1) of the Act, 21  U.S.C. section 360bbb-3(b)(1), unless the authorization is  terminated or revoked. Performed at [facility], [place] [address] [place], [place] [address]          Radiology Studies: No results found.      Scheduled Meds: . amitriptyline  100 mg Oral QHS  . atorvastatin  20 mg Oral Daily  . Chlorhexidine Gluconate Cloth  6 each Topical Daily  . clopidogrel  75 mg Oral Daily  . collagenase   Topical Daily  . fluticasone furoate-vilanterol  1 puff Inhalation Daily  . furosemide  40 mg Intravenous BID  . gabapentin  400 mg Oral TID  . heparin injection (subcutaneous)  5,000 Units Subcutaneous Q8H  . insulin aspart  0-15 Units Subcutaneous TID WC  . insulin aspart  0-5 Units Subcutaneous QHS  . insulin aspart  2 Units Subcutaneous TID WC  . [START ON 03/16/2020] insulin glargine  40 Units Subcutaneous Daily  . lipase/protease/amylase  72,000 Units Oral TID WC  . metoprolol succinate  50 mg Oral Daily  .  multivitamin with minerals  1 tablet Oral Daily  . nicotine  7 mg Transdermal Daily  . pantoprazole  40 mg Oral Daily  . QUEtiapine  50 mg Oral QHS   Continuous Infusions: . ceFEPime (MAXIPIME) IV 2 g (03/15/20 0519)  . lactated ringers 10 mL/hr at 03/04/20 0756     LOS: 21 days   Time spent: 30 mins.More than 50% of that time was spent in counseling and/or coordination of care.  Darliss Cheney, MD [facility] P11/15/2021, 11:20 AM

## 2020-03-15 NOTE — Progress Notes (Signed)
Physical Therapy Treatment Patient Details Name: Morgan Beasley MRN: [id number] DOB: 08-01-1960 Today's Date: 03/15/2020    History of Present Illness Pt is a 59 y.o. female admitted 02/23/20 with LLE pain; lab work consistent with sepsis; also with LLE cellulitis. On 10/31, pt with acute hallucinations and AMS found to have R occipital CVA with parietotemporal extension and chronic L occipital CVA. TEE 11/12 showed pulmonary valve vegetation. Ultrasound shows LUE brachial vein thrombus. Hospital course complicated by acute encephalopathy. PMH includes CHF, CKD3, COPD, DM, neuropathy, HTN, CAD, OSA, PAD, tobacco use, alcohol dependence, anxiety, gout, breast CA, R foot ulcer, LLE vascular intervention in 2021 (most recently 01/26/20).    PT Comments    Pt wanted to perform PT after having decreased agitation after having medication. Pt stated that she wanted to walk in the hallways, but was fatigued after performing ADLs in the bathroom and sitting down in her rollator next to the sink. Pt showed decreased endurance with gait to bed, but is suspect to medication. Plan of care remains appropriate when pts agitation/AMS can be controlled during PT.   Follow Up Recommendations  SNF;Supervision/Assistance - 24 hour     Equipment Recommendations  3in1 (PT)    Recommendations for Other Services       Precautions / Restrictions Precautions Precautions: Fall;Other (comment) Precaution Comments: L foot wound, LUE DVT    Mobility  Bed Mobility Overal bed mobility: Needs Assistance Bed Mobility: Sit to Supine       Sit to supine: Mod assist;HOB elevated   General bed mobility comments: pt needed mod assist +1 to get legs onto bed at end of session and mod assist +2 to get her readjusted in bed  Transfers Overall transfer level: Needs assistance Equipment used: 4-wheeled walker Transfers: Sit to/from Stand Sit to Stand: Min guard         General transfer comment: pt performed  sit to stand from rollator near sink with BUE reliance on rollator and sink; pt returned to sitting on EOB  Ambulation/Gait Ambulation/Gait assistance: Min assist Gait Distance (Feet): 15 Feet Assistive device: 4-wheeled walker Gait Pattern/deviations: Wide base of support;Decreased stride length;Step-through pattern;Trunk flexed   Gait velocity interpretation: <1.31 ft/sec, indicative of household ambulator General Gait Details: pt noted significant fatigue after medication took affect and needed verbal cues to turn in order to not get tangled in IV line   Stairs             Wheelchair Mobility    Modified Rankin (Stroke Patients Only)       Balance Overall balance assessment: Needs assistance Sitting-balance support: Feet supported Sitting balance-Leahy Scale: Fair Sitting balance - Comments: EOB without support   Standing balance support: Single extremity supported;Bilateral upper extremity supported;During functional activity Standing balance-Leahy Scale: Poor Standing balance comment: reliant on BUE support from rollator during standing                            Cognition Arousal/Alertness: Lethargic;Suspect due to medications Behavior During Therapy: [facility] for tasks assessed/performed Overall Cognitive Status: Impaired/Different from baseline Area of Impairment: Orientation;Attention;Memory;Following commands;Safety/judgement;Awareness;Problem solving                 Orientation Level: Time;Situation;Disoriented to Current Attention Level: Focused Memory: Decreased recall of precautions;Decreased short-term memory Following Commands: Follows one step commands inconsistently;Follows one step commands with increased time Safety/Judgement: Decreased awareness of safety;Decreased awareness of deficits Awareness: Intellectual Problem Solving: Slow processing;Requires verbal  cues;Requires tactile cues General Comments: pt calmer and lethargic after  being administered medication      Exercises      General Comments        Pertinent Vitals/Pain Pain Assessment: No/denies pain Pain Intervention(s): Monitored during session    Home Living                      Prior Function            PT Goals (current goals can now be found in the care plan section) Acute Rehab PT Goals Patient Stated Goal: I want to walk Progress towards PT goals: Progressing toward goals (pt able to progress now that pt has been medicated for agitation)    Frequency    Min 2X/week      PT Plan Current plan remains appropriate    Co-evaluation              AM-PAC PT "6 Clicks" Mobility   Outcome Measure  Help needed turning from your back to your side while in a flat bed without using bedrails?: A Little Help needed moving from lying on your back to sitting on the side of a flat bed without using bedrails?: A Little Help needed moving to and from a bed to a chair (including a wheelchair)?: A Little Help needed standing up from a chair using your arms (e.g., wheelchair or bedside chair)?: A Little Help needed to walk in hospital room?: A Lot Help needed climbing 3-5 steps with a railing? : Total 6 Click Score: 15    End of Session Equipment Utilized During Treatment: Gait belt Activity Tolerance: Patient limited by fatigue Patient left: in bed;with call bell/phone within reach;with bed alarm set Nurse Communication: Mobility status PT Visit Diagnosis: Other abnormalities of gait and mobility (R26.89);Pain;Muscle weakness (generalized) (M62.81);Unsteadiness on feet (R26.81)     Time: 1010-1034 PT Time Calculation (min) (ACUTE ONLY): 24 min  Charges:  $Therapeutic Activity: 23-37 mins                     Caleb Popp, SPT [id number]   Morgan Beasley 03/15/2020, 1:27 PM

## 2020-03-15 NOTE — Plan of Care (Signed)
  Problem: Education: Goal: Knowledge of General Education information will improve Description: Including pain rating scale, medication(s)/side effects and non-pharmacologic comfort measures Outcome: Progressing   Problem: Clinical Measurements: Goal: Ability to maintain clinical measurements within normal limits will improve Outcome: Progressing Goal: Will remain free from infection Outcome: Progressing Goal: Diagnostic test results will improve Outcome: Progressing Goal: Respiratory complications will improve Outcome: Progressing Goal: Cardiovascular complication will be avoided Outcome: Progressing   Problem: Activity: Goal: Risk for activity intolerance will decrease Outcome: Progressing   Problem: Nutrition: Goal: Adequate nutrition will be maintained Outcome: Progressing   Problem: Coping: Goal: Level of anxiety will decrease Outcome: Progressing   Problem: Elimination: Goal: Will not experience complications related to bowel motility Outcome: Progressing   Problem: Pain Managment: Goal: General experience of comfort will improve Outcome: Progressing   Problem: Skin Integrity: Goal: Risk for impaired skin integrity will decrease Outcome: Progressing   Problem: Education: Goal: Ability to describe self-care measures that may prevent or decrease complications (Diabetes Survival Skills Education) will improve Outcome: Progressing Goal: Individualized Educational Video(s) Outcome: Progressing   Problem: Coping: Goal: Ability to adjust to condition or change in health will improve Outcome: Progressing   Problem: Fluid Volume: Goal: Ability to maintain a balanced intake and output will improve Outcome: Progressing   Problem: Metabolic: Goal: Ability to maintain appropriate glucose levels will improve Outcome: Progressing   Problem: Nutritional: Goal: Maintenance of adequate nutrition will improve Outcome: Progressing Goal: Progress toward achieving an  optimal weight will improve Outcome: Progressing   Problem: Skin Integrity: Goal: Risk for impaired skin integrity will decrease Outcome: Progressing   Problem: Tissue Perfusion: Goal: Adequacy of tissue perfusion will improve Outcome: Progressing   Problem: Education: Goal: Knowledge of secondary prevention will improve Outcome: Progressing Goal: Knowledge of patient specific risk factors addressed and post discharge goals established will improve Outcome: Progressing Goal: Individualized Educational Video(s) Outcome: Progressing

## 2020-03-15 NOTE — Progress Notes (Addendum)
Unable to assess patient today recently received medication to help control her agitation. Psych consult was placed for behavioral disturbances. Patient with previous QTc of 560, originally would not recommend antipsychotics. Recommendations were made to treat the symptoms and behavioral disturbances. Repeat EKG was obtained today and her QTc has come down at this time to 506. It is with stong reservation that we do not use first generation antipsychotics (Haldol)  as they can lengthen Qtc. Will recommend starting olanzapine 2.5mg  po qhs prn for agitation and behavioral disturbances if warranted. Also added Buspar 10mg  po BID for anxiety. Will DC Haldol and Seroquel at this time, due to high risk of worsening QTc prolongation.  Will attempt to see her tomorrow.
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
## 2020-03-16 DIAGNOSIS — R6521 Severe sepsis with septic shock: Secondary | ICD-10-CM | POA: Diagnosis not present

## 2020-03-16 DIAGNOSIS — A419 Sepsis, unspecified organism: Secondary | ICD-10-CM | POA: Diagnosis not present

## 2020-03-16 LAB — BASIC METABOLIC PANEL
Anion gap: 10 (ref 5–15)
BUN: 22 mg/dL — ABNORMAL HIGH (ref 6–20)
CO2: 31 mmol/L (ref 22–32)
Calcium: 9.1 mg/dL (ref 8.9–10.3)
Chloride: 101 mmol/L (ref 98–111)
Creatinine, Ser: 1.28 mg/dL — ABNORMAL HIGH (ref 0.44–1.00)
GFR, Estimated: 49 mL/min — ABNORMAL LOW (ref >60)
Glucose, Bld: 107 mg/dL — ABNORMAL HIGH (ref 70–99)
Potassium: 3.6 mmol/L (ref 3.5–5.1)
Sodium: 142 mmol/L (ref 135–145)

## 2020-03-16 LAB — CBC WITH DIFFERENTIAL/PLATELET
Abs Immature Granulocytes: 0.02 10*3/uL (ref 0.00–0.07)
Basophils Absolute: 0 10*3/uL (ref 0.0–0.1)
Basophils Relative: 0 %
Eosinophils Absolute: 0.3 10*3/uL (ref 0.0–0.5)
Eosinophils Relative: 3 %
HCT: 33.4 % — ABNORMAL LOW (ref 36.0–46.0)
Hemoglobin: 9.9 g/dL — ABNORMAL LOW (ref 12.0–15.0)
Immature Granulocytes: 0 %
Lymphocytes Relative: 22 %
Lymphs Abs: 1.8 10*3/uL (ref 0.7–4.0)
MCH: 24.3 pg — ABNORMAL LOW (ref 26.0–34.0)
MCHC: 29.6 g/dL — ABNORMAL LOW (ref 30.0–36.0)
MCV: 81.9 fL (ref 80.0–100.0)
Monocytes Absolute: 1.1 10*3/uL — ABNORMAL HIGH (ref 0.1–1.0)
Monocytes Relative: 13 %
Neutro Abs: 5 10*3/uL (ref 1.7–7.7)
Neutrophils Relative %: 62 %
Platelets: 328 10*3/uL (ref 150–400)
RBC: 4.08 MIL/uL (ref 3.87–5.11)
RDW: 20.4 % — ABNORMAL HIGH (ref 11.5–15.5)
WBC: 8.1 10*3/uL (ref 4.0–10.5)
nRBC: 0.4 % — ABNORMAL HIGH (ref 0.0–0.2)

## 2020-03-16 LAB — GLUCOSE, CAPILLARY
Glucose-Capillary: 66 mg/dL — ABNORMAL LOW (ref 70–99)
Glucose-Capillary: 112 mg/dL — ABNORMAL HIGH (ref 70–99)
Glucose-Capillary: 174 mg/dL — ABNORMAL HIGH (ref 70–99)
Glucose-Capillary: 184 mg/dL — ABNORMAL HIGH (ref 70–99)
Glucose-Capillary: 220 mg/dL — ABNORMAL HIGH (ref 70–99)

## 2020-03-16 MED ORDER — CARBAMAZEPINE 200 MG PO TABS
100.0000 mg | ORAL_TABLET | Freq: Two times a day (BID) | ORAL | Status: DC
  Administered 2020-03-16 – 2020-03-18 (×5): 100 mg via ORAL
  Filled 2020-03-16 (×6): qty 0.5

## 2020-03-16 MED ORDER — OLANZAPINE 2.5 MG PO TABS
2.5000 mg | ORAL_TABLET | Freq: Once | ORAL | Status: AC
  Administered 2020-03-16: 2.5 mg via ORAL
  Filled 2020-03-16: qty 1

## 2020-03-16 NOTE — Progress Notes (Signed)
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 She was found to have sepsis secondary to Serratia bacteremia from left lower extremity cellulitis with complicated chronic nonhealing ulcer. Hospital course remarkable for persistent lactic acidosis concerning for critical lower extremity vascular ischemia. Vascular surgery consulted and she was transferred to [facility]. During this hospitalization, she developed acute nonhemorrhagic stroke, also developed AKI and shock liver due to hypotension. Neurology following for stroke.Underwnt TEE on 11.4.21. Hospital course remarkable for persistent encephalopathy but now the mental status has improved but remains intermittently agitated.  TEE showed pulmonary valve vegetation.  Plan to continue cefepime till 03/21/2020.  PT/OT recommended skilled nursing  facility on discharge.  Plan was for discharge on 03/09/2020 but she was agitated and had to be restrained.  She is now under IVC until tomorrow.  Has been having behavioral issues with intermittent agitation and aggressive with the staff.  Psychiatry reconsulted.  Assessment & Plan:   Principal Problem:   Septic shock ([place]) Active Problems:   Uncontrolled diabetes mellitus with circulatory complication, with  long-term current use of insulin (HCC)   COPD (chronic obstructive pulmonary disease) (HCC)   PVD (peripheral vascular disease) (HCC)   Ischemia of left lower extremity   Chronic systolic CHF (congestive heart failure) (HCC)   Obesity (BMI 30-39.9)   Cerebral embolism with cerebral infarction   Septic shock/gram-negative bacteremia: In the setting of  bilateral lower extremity peripheral artery disease with chronic left lower extremity nonhealing ulcer diabetic patient. Hospital course remarkable for shock, AKI.  Blood culture from 02/23/2020 + however from 03/05/2020 are negative to date.  She was treated with IV fluids. Sepsis physiology has resolved. Continue current antibiotics and wound care. ID also consulted for finding of possible pulmonary valve vegetation.  They recommended to continue cefepime through 03/21/2020.  Hypokalemia: Resolved.  Acute nonhemorrhagic stroke: Hospital course remarkable for encephalopathy secondary to acute right occipital infarct. She developed mild left arm weakness. Neurology consulted.  Stroke work-up completed. On Plavix currently. TEE showed ejection fraction of 40%, no PFO or  cardiac source of emboli but probable pulmonary valve vegetation.  Also showed catheter thrombus. PT/OT evaluation done and recommended skilled nursing facility.-  Possible pulmonary valve vegetation/catheter related vegetation versus thrombus: Uncommon scenario of finding of possible pulmonary vegetation in the setting of gram-negative bacteremia. ID was following .  Per them, no need to start anticoagulation for catheter related thrombus. No need to remove central line.  Continue cefepime till 03/21/2020.  Acute encephalopathy/agitation/aggressive behavior/delirium:  Behavioral disturbance could be associated with the stroke or underlying psychiatric issues/personality disorders.  Psychiatry was consulted and they recommended as needed Ativan and adding Trileptal if personality  changes become an issue. ativan was discontinued.  And then Haldol and Seroquel was also discontinued by psychiatry yesterday due to prolonged QTC.  She has now been started on Tegretol, BuSpar and olanzapine by  psychiatry.  Peripheral artery disease/nonhealing lower extremity ulcer: Vascular surgery following. She is status post left femoropopliteal bypass on 10/19/2019. On Plavix and statin at home.  Vascular surgery ordered ABI/lower extremity arterial duplex for outpatient follow up.  Chronic combined systolic/diastolic congestive heart failure: Echo done few months ago showed ejection fraction of 40 to 40% with global hypokinesis. Elevated BNP. She received fluids during this hospitalization for septic shock.  She still looks volume overloaded with some edema in lower extremities and left upper extremity.  We will continue Lasix 40 mg IV twice daily and reassess daily for consideration of switching back to oral.  Prolonged QTc: Avoiding QTC prolonging medications.  Elevated liver enzymes: Most likely from shock liver from hypotension. Right upper quadrant ultrasound showed nonspecific changes. She was seen by general surgery, no plan for intervention. Acute hepatitis panel negative. Right upper quadrant liver Doppler stable. Questionable early NASH on right upper quadrant ultrasound.Liver enzymes improved.  Diabetes type 2/uncontrolled hyperglycemia: Hemoglobin A1c of 15.  Some hypoglycemia this morning but otherwise hyperglycemic.  Continue Lantus 40 units and SSI.  Anemia of chronic disease: Currently hemoglobin is stable. Continue to monitor.  AKI on CKD stage IIIa: Baseline creatinine 1.4. Hospital course remarkable for AKI secondary to contrast nephropathy, dehydration, hypotension. Currently kidney function at baseline.  Hyperlipidemia: On statin.  COPD/tobacco abuse: Counseled for smoking cessation. Currently stable in terms of COPD.  Obesity with OSA: BMI of 32. Uses cpap at night at  home.  Hypertension: Hospital course remarkable for hypotension.  Fluctuating blood pressure.  Continue beta-blocker.  Reactive mediastinal/hilar/right supraclavicular adenopathy: Incidental finding during this hospitalization. Outpatient biopsy recommended  Debility/deconditioning: PT/OT recommended SN facility on discharge..  Left upper extremity swelling: Venous ultrasound shows left upper extremity brachial vein superficial thrombus. keep the left upper extremity elevated and warm compresses.  Due to her CKD, I would avoid NSAIDs.  It has been hard for nursing staff to keep her extremity elevated effusion.  Acute hypoxic respiratory failure: This was likely due to atelectasis.  This has resolved.  Patient is requiring intermittent oxygen, especially during sleep.  Recommend having sleep studies as outpatient to rule out OSA.  Hypomagnesemia: Resolved.  Nutrition Problem: Increased nutrient needs Etiology: wound healing    DVT prophylaxis:Heparin [place] Code Status: Full Family Communication: None present at bedside.  Discussed with her daughter [name] in person on 03/14/2020.  Please refer to detailed note on the same date. Remains inpatient appropriate because:Inpatient level of care appropriate due to severity of illness   Dispo: The patient is from: Home              Anticipated d/c is to: SNF              Anticipated d/c date is: 1 to 2 days.  Extended length of his stay due to patient's behavioral issues.  She has been declined by 3 SNF facilities already.  Does not have any 24/7 care at home.  Consultants: Vascular surgery, Neurology   Procedures:TEE  Antimicrobials:  Anti-infectives (From admission, onward)   Start     Dose/Rate Route Frequency Ordered Stop   03/07/20 0000  ceFEPime (MAXIPIME) IVPB        2 g Intravenous Every 12 hours 03/07/20 0819 03/23/20 2359   03/02/20 1800  ceFEPIme (MAXIPIME) 2 g in sodium chloride 0.9 % 100 mL IVPB        2 g 200 mL/hr over 30  Minutes Intravenous Every 12 hours 03/02/20 0853  02/28/20 0546  ceFEPIme (MAXIPIME) 2 g in sodium chloride 0.9 % 100 mL IVPB  Status:  Discontinued        2 g 200 mL/hr over 30 Minutes Intravenous Every 24 hours 02/27/20 1041 03/02/20 0853   02/25/20 1400  metroNIDAZOLE (FLAGYL) tablet 500 mg  Status:  Discontinued        500 mg Oral Every 8 hours 02/25/20 1025 02/29/20 0911   02/24/20 1800  ceFEPIme (MAXIPIME) 2 g in sodium chloride 0.9 % 100 mL IVPB  Status:  Discontinued        2 g 200 mL/hr over 30 Minutes Intravenous Every 12 hours 02/24/20 0732 02/27/20 1041   02/24/20 0600  ceFEPIme (MAXIPIME) 2 g in sodium chloride 0.9 % 100 mL IVPB  Status:  Discontinued        2 g 200 mL/hr over 30 Minutes Intravenous Every 8 hours 02/24/20 0440 02/24/20 0732   02/23/20 1015  vancomycin (VANCOREADY) IVPB 1750 mg/350 mL        1,750 mg 175 mL/hr over 120 Minutes Intravenous  Once 02/23/20 1007 02/23/20 1347   02/23/20 1000  cefTRIAXone (ROCEPHIN) 2 g in sodium chloride 0.9 % 100 mL IVPB  Status:  Discontinued        2 g 200 mL/hr over 30 Minutes Intravenous Every 24 hours 02/23/20 0952 02/24/20 0440   02/23/20 1000  metroNIDAZOLE (FLAGYL) IVPB 500 mg  Status:  Discontinued        500 mg 100 mL/hr over 60 Minutes Intravenous Every 8 hours 02/23/20 0952 02/25/20 1025      Subjective: Seen and examined this morning.  Patient pleasant but confused.  Denied any complaint.  She started crying and soon after, she was smiling.  She has these rapidly fluctuating moods almost every day.  Objective: Vitals:   03/15/20 0754 03/15/20 0845 03/15/20 1134 03/16/20 1058  BP: 137/84  128/84 (!) 183/132  Pulse:  (!) 104 85   Resp:  20    Temp: 97.8 F (36.6 C)  97.8 F (36.6 C)   TempSrc: Oral  Oral   SpO2:      Weight:      Height:        Intake/Output Summary (Last 24 hours) at 03/16/2020 1455 Last data filed at 03/15/2020 2000 Gross per 24 hour  Intake 240 ml  Output -  Net 240 ml    Filed Weights   02/23/20 0937 03/04/20 0741  Weight: 90.3 kg 86.2 kg    Examination:  General exam: Appears calm and comfortable  Respiratory system: Clear to auscultation. Respiratory effort normal. Cardiovascular system: S1 & S2 heard, RRR. No JVD, murmurs, rubs, gallops or clicks. No pedal edema. Gastrointestinal system: Abdomen is nondistended, soft and nontender. No organomegaly or masses felt. Normal bowel sounds heard. Central nervous system: Alert and oriented x2. No focal neurological deficits. Extremities: Symmetric 5 x 5 power. Skin: No rashes, lesions or ulcers.  Psychiatry: Judgement and insight appear poor. Mood & affect appropriate.   Data Reviewed: I have personally reviewed following labs and imaging studies  CBC: Recent Labs  Lab 03/12/20 0659 03/13/20 0622 03/14/20 0540 03/15/20 0317 03/16/20 0315  WBC 7.5 6.0 8.6 7.5 8.1  NEUTROABS 4.1 3.1 5.7 5.0 5.0  HGB 9.6* 9.9* 9.6* 9.5* 9.9*  HCT 32.3* 33.1* 31.6* 31.2* 33.4*  MCV 81.0 82.8 80.0 80.8 81.9  PLT 344 343 326 309 562   Basic Metabolic Panel: Recent Labs  Lab 03/10/20 0631 03/11/20 0240 03/12/20  2778 03/13/20 0622 03/16/20 0315  NA 143 140 138 139 142  K 3.3* 3.3* 4.3 4.1 3.6  CL 106 103 102 102 101  CO2 30 30 24 29 31   GLUCOSE 55* 201* 252* 126* 107*  BUN 17 18 21* 23* 22*  CREATININE 1.14* 1.17* 1.38* 1.29* 1.28*  CALCIUM 8.4* 8.0* 8.6* 8.8* 9.1  MG  --  1.2* 1.9  --   --    GFR: Estimated Creatinine Clearance: 53 mL/min (A) (by C-G formula based on SCr of 1.28 mg/dL (H)). Liver Function Tests: Recent Labs  Lab 03/10/20 0631 03/11/20 0240 03/12/20 0659 03/13/20 0622  AST 38 44* 31 24  ALT 61* 55* 49* 41  ALKPHOS 227* 220* 220* 212*  BILITOT 0.5 0.5 0.5 0.5  PROT 6.4* 5.9* 6.6 6.6  ALBUMIN 1.9* 1.8* 2.0* 1.9*   No results for input(s): LIPASE, AMYLASE in the last 168 hours. No results for input(s): AMMONIA in the last 168 hours. Coagulation Profile: No results for  input(s): INR, PROTIME in the last 168 hours. Cardiac Enzymes: No results for input(s): CKTOTAL, CKMB, CKMBINDEX, TROPONINI in the last 168 hours. BNP (last 3 results) Recent Labs    12/19/19 1546 02/09/20 0825  PROBNP 1,148* 1,185*   HbA1C: No results for input(s): HGBA1C in the last 72 hours. CBG: Recent Labs  Lab 03/15/20 1800 03/15/20 2153 03/16/20 0824 03/16/20 0913 03/16/20 1216  GLUCAP 230* 171* 66* 112* 174*   Lipid Profile: No results for input(s): CHOL, HDL, LDLCALC, TRIG, CHOLHDL, LDLDIRECT in the last 72 hours. Thyroid Function Tests: No results for input(s): TSH, T4TOTAL, FREET4, T3FREE, THYROIDAB in the last 72 hours. Anemia Panel: No results for input(s): VITAMINB12, FOLATE, FERRITIN, TIBC, IRON, RETICCTPCT in the last 72 hours. Sepsis Labs: No results for input(s): PROCALCITON, LATICACIDVEN in the last 168 hours.  Recent Results (from the past 240 hour(s))  Respiratory Panel by RT PCR (Flu A&B, Covid) - Nasopharyngeal Swab     Status: None   Collection Time: 03/09/20  9:10 AM   Specimen: Nasopharyngeal Swab  Result Value Ref Range Status   SARS Coronavirus 2 by RT PCR NEGATIVE NEGATIVE Final    Comment: (NOTE) SARS-CoV-2 target nucleic acids are NOT DETECTED.  The SARS-CoV-2 RNA is generally detectable in upper respiratoy specimens during the acute phase of infection. The lowest concentration of SARS-CoV-2 viral copies this assay can detect is 131 copies/mL. A negative result does not preclude SARS-Cov-2 infection and should not be used as the sole basis for treatment or other patient management decisions. A negative result may occur with  improper specimen collection/handling, submission of specimen other than nasopharyngeal swab, presence of viral mutation(s) within the areas targeted by this assay, and inadequate number of viral copies (<131 copies/mL). A negative result must be combined with clinical observations, patient history, and  epidemiological information. The expected result is Negative.  Fact Sheet for Patients:  PinkCheek.be  Fact Sheet for Healthcare Providers:  GravelBags.it  This test is no t yet approved or cleared by the [place] FDA and  has been authorized for detection and/or diagnosis of SARS-CoV-2 by FDA under an Emergency Use Authorization (EUA). This EUA will remain  in effect (meaning this test can be used) for the duration of the COVID-19 declaration under Section 564(b)(1) of the Act, 21 U.S.C. section 360bbb-3(b)(1), unless the authorization is terminated or revoked sooner.     Influenza A by PCR NEGATIVE NEGATIVE Final   Influenza B by PCR NEGATIVE NEGATIVE Final  Comment: (NOTE) The Xpert Xpress SARS-CoV-2/FLU/RSV assay is intended as an aid in  the diagnosis of influenza from Nasopharyngeal swab specimens and  should not be used as a sole basis for treatment. Nasal washings and  aspirates are unacceptable for Xpert Xpress SARS-CoV-2/FLU/RSV  testing.  Fact Sheet for Patients: PinkCheek.be  Fact Sheet for Healthcare Providers: GravelBags.it  This test is not yet approved or cleared by the [place] FDA and  has been authorized for detection and/or diagnosis of SARS-CoV-2 by  FDA under an Emergency Use Authorization (EUA). This EUA will remain  in effect (meaning this test can be used) for the duration of the  Covid-19 declaration under Section 564(b)(1) of the Act, 21  U.S.C. section 360bbb-3(b)(1), unless the authorization is  terminated or revoked. Performed at [facility], [place] [address], [place], [place] [address]          Radiology Studies: No results found.      Scheduled Meds: . amitriptyline  100 mg Oral QHS  . atorvastatin  20 mg Oral Daily  . busPIRone  10 mg Oral BID  . carbamazepine  100 mg Oral BID  . Chlorhexidine  Gluconate Cloth  6 each Topical Daily  . clopidogrel  75 mg Oral Daily  . collagenase   Topical Daily  . fluticasone furoate-vilanterol  1 puff Inhalation Daily  . furosemide  40 mg Intravenous BID  . gabapentin  400 mg Oral TID  . heparin injection (subcutaneous)  5,000 Units Subcutaneous Q8H  . insulin aspart  0-15 Units Subcutaneous TID WC  . insulin aspart  0-5 Units Subcutaneous QHS  . insulin aspart  2 Units Subcutaneous TID WC  . insulin glargine  40 Units Subcutaneous Daily  . lipase/protease/amylase  72,000 Units Oral TID WC  . metoprolol succinate  50 mg Oral Daily  . multivitamin with minerals  1 tablet Oral Daily  . nicotine  7 mg Transdermal Daily  . pantoprazole  40 mg Oral Daily   Continuous Infusions: . ceFEPime (MAXIPIME) IV 2 g (03/16/20 0818)  . lactated ringers 10 mL/hr at 03/04/20 0756     LOS: 22 days   Time spent: 29 mins.More than 50% of that time was spent in counseling and/or coordination of care.  Darliss Cheney, MD [facility] P11/16/2021, 2:55 PM

## 2020-03-16 NOTE — Plan of Care (Signed)
  Problem: Education: Goal: Knowledge of General Education information will improve Description: Including pain rating scale, medication(s)/side effects and non-pharmacologic comfort measures Outcome: Progressing   Problem: Health Behavior/Discharge Planning: Goal: Ability to manage health-related needs will improve Outcome: Progressing   Problem: Clinical Measurements: Goal: Ability to maintain clinical measurements within normal limits will improve Outcome: Progressing Goal: Will remain free from infection Outcome: Progressing Goal: Diagnostic test results will improve Outcome: Progressing Goal: Respiratory complications will improve Outcome: Progressing Goal: Cardiovascular complication will be avoided Outcome: Progressing   Problem: Activity: Goal: Risk for activity intolerance will decrease Outcome: Progressing   Problem: Nutrition: Goal: Adequate nutrition will be maintained Outcome: Progressing   Problem: Coping: Goal: Level of anxiety will decrease Outcome: Progressing   Problem: Elimination: Goal: Will not experience complications related to bowel motility Outcome: Progressing   Problem: Pain Managment: Goal: General experience of comfort will improve Outcome: Progressing   Problem: Skin Integrity: Goal: Risk for impaired skin integrity will decrease Outcome: Progressing   Problem: Education: Goal: Ability to describe self-care measures that may prevent or decrease complications (Diabetes Survival Skills Education) will improve Outcome: Progressing Goal: Individualized Educational Video(s) Outcome: Progressing   Problem: Coping: Goal: Ability to adjust to condition or change in health will improve Outcome: Progressing   Problem: Fluid Volume: Goal: Ability to maintain a balanced intake and output will improve Outcome: Progressing   Problem: Metabolic: Goal: Ability to maintain appropriate glucose levels will improve Outcome: Progressing    Problem: Nutritional: Goal: Maintenance of adequate nutrition will improve Outcome: Progressing Goal: Progress toward achieving an optimal weight will improve Outcome: Progressing   Problem: Skin Integrity: Goal: Risk for impaired skin integrity will decrease Outcome: Progressing   Problem: Tissue Perfusion: Goal: Adequacy of tissue perfusion will improve Outcome: Progressing   Problem: Education: Goal: Knowledge of secondary prevention will improve Outcome: Progressing Goal: Knowledge of patient specific risk factors addressed and post discharge goals established will improve Outcome: Progressing Goal: Individualized Educational Video(s) Outcome: Progressing

## 2020-03-16 NOTE — Progress Notes (Signed)
Nutrition Follow-up  DOCUMENTATION CODES:   Obesity unspecified  INTERVENTION:   - Continue snacks TID  - Continue MVI with minerals daily  - Magic Cup TID with meals, each supplement provides 290 kcal and 9 grams of protein  NUTRITION DIAGNOSIS:   Increased nutrient needs related to wound healing as evidenced by estimated needs.  Ongoing  GOAL:   Patient will meet greater than or equal to 90% of their needs  Progressing  MONITOR:   PO intake, Supplement acceptance, Labs, Weight trends, Skin, I & O's  REASON FOR ASSESSMENT:   Low [place], NPO/Clear Liquid Diet    ASSESSMENT:   Pt admitted with septic shock 2/2  bacteremia from LLE cellulitis and nonhealing foot ulcer. PMH includes DM, CAD, CHF, HTN, PVD/PAD s/p angioplasty and stent placement.   Pt unable to participate in follow up. Opened eyes to voice but unable to answer questions. Meal completions lack documentation. Breakfast yesterday documented as 75%. RD observed today's breakfast untouched at bedside. Nursing unsure if pt consuming snacks or magic cups. Noted to dislike oral nutrition supplements.   Admission weight: 90.3 kg  Current weight: 86.2 kg (taken 11/4)  Medications: 40 mg lasix BID, SS novolog, lantus, creon (72000 units) Labs: CBG 28-003  Diet Order:   Diet Order            Diet heart healthy/carb modified Room service appropriate? Yes; Fluid consistency: Thin  Diet effective now                 EDUCATION NEEDS:   Education needs have been addressed  Skin:  Skin Assessment: Skin Integrity Issues: Other: non-healing left foot wound  Last BM:  11/15  Height:   Ht Readings from Last 1 Encounters:  03/04/20 5\' 6"  (1.676 m)    Weight:   Wt Readings from Last 1 Encounters:  03/04/20 86.2 kg    Ideal Body Weight:  59.09 kg  BMI:  Body mass index is 30.67 kg/m.  Estimated Nutritional Needs:   Kcal:  1950-2150  Protein:  100-120 grams  Fluid:  >/=1.95 L/d  Mariana Single RD, LDN Clinical Nutrition Pager listed in [place]

## 2020-03-16 NOTE — Progress Notes (Signed)
Pt is becoming agitated, threatening the sitter, telling her to get out of room. Nurse into room and pt. yelling get out of my room, nurse left room and pt. Came to door way and was looking up and down hall. Berneta Levins FNP notified and verbal order for one time dose of zyprexa ordered.

## 2020-03-16 NOTE — Plan of Care (Signed)
  Problem: Education: Goal: Knowledge of General Education information will improve Description: Including pain rating scale, medication(s)/side effects and non-pharmacologic comfort measures Outcome: Progressing   Problem: Health Behavior/Discharge Planning: Goal: Ability to manage health-related needs will improve Outcome: Progressing   Problem: Clinical Measurements: Goal: Ability to maintain clinical measurements within normal limits will improve Outcome: Progressing Goal: Diagnostic test results will improve Outcome: Progressing Goal: Respiratory complications will improve Outcome: Progressing Goal: Cardiovascular complication will be avoided Outcome: Progressing   Problem: Activity: Goal: Risk for activity intolerance will decrease Outcome: Progressing   Problem: Nutrition: Goal: Adequate nutrition will be maintained Outcome: Progressing   Problem: Elimination: Goal: Will not experience complications related to bowel motility Outcome: Progressing   Problem: Pain Managment: Goal: General experience of comfort will improve Outcome: Progressing   Problem: Skin Integrity: Goal: Risk for impaired skin integrity will decrease Outcome: Progressing   Problem: Education: Goal: Ability to describe self-care measures that may prevent or decrease complications (Diabetes Survival Skills Education) will improve Outcome: Progressing Goal: Individualized Educational Video(s) Outcome: Progressing   Problem: Coping: Goal: Ability to adjust to condition or change in health will improve Outcome: Progressing   Problem: Fluid Volume: Goal: Ability to maintain a balanced intake and output will improve Outcome: Progressing   Problem: Metabolic: Goal: Ability to maintain appropriate glucose levels will improve Outcome: Progressing   Problem: Nutritional: Goal: Maintenance of adequate nutrition will improve Outcome: Progressing Goal: Progress toward achieving an optimal weight  will improve Outcome: Progressing   Problem: Skin Integrity: Goal: Risk for impaired skin integrity will decrease Outcome: Progressing   Problem: Tissue Perfusion: Goal: Adequacy of tissue perfusion will improve Outcome: Progressing   Problem: Education: Goal: Knowledge of secondary prevention will improve Outcome: Progressing Goal: Knowledge of patient specific risk factors addressed and post discharge goals established will improve Outcome: Progressing Goal: Individualized Educational Video(s) Outcome: Progressing

## 2020-03-16 NOTE — Progress Notes (Signed)
OT Cancellation Note  Patient Details Name: Morgan Beasley MRN: [id number] DOB: Sep 06, 1960   Cancelled Treatment:    Reason Eval/Treat Not Completed: Other (comment). Per staff, pt with increased agitation today and aggressive towards staff, awaiting medication. Plan to hold off on therapy session for now. Will re-attempt later as appropriate.   Layla Maw 03/16/2020, 11:11 AM

## 2020-03-16 NOTE — Consult Note (Signed)
Writer attempted to assess patient again today however she was unable to participate in her evaluation due to her being too sleepy. She was observed to be sitting upright in the chair, asleep. She would awaken to her name being called however she would doze back off.  Psych consult was placed for behavioral disturbances. Patient with previous QTc of 560, originally would not recommend antipsychotics. Recommendations were made to treat the symptoms and behavioral disturbances. Patient with intermittent periods of agitation and aggression at this time.  Repeat EKG was obtained today and her QTc has come down at this time to 506. It is with stong reservation that we do not use first generation antipsychotics (Haldol)  as they can lengthen Qtc. -Will recommend starting olanzapine 2.5mg  po qhs prn for agitation and behavioral disturbances if warranted.  -Will add Tegretol 100mg  po BID for behavioral disturbances.  -Added Buspar 10mg  po BID for anxiety.  -Will DC Haldol and Seroquel at this time, due to high risk of worsening QTc prolongation.

## 2020-03-17 DIAGNOSIS — I5022 Chronic systolic (congestive) heart failure: Secondary | ICD-10-CM | POA: Diagnosis not present

## 2020-03-17 DIAGNOSIS — J441 Chronic obstructive pulmonary disease with (acute) exacerbation: Secondary | ICD-10-CM

## 2020-03-17 DIAGNOSIS — A419 Sepsis, unspecified organism: Secondary | ICD-10-CM | POA: Diagnosis not present

## 2020-03-17 DIAGNOSIS — I998 Other disorder of circulatory system: Secondary | ICD-10-CM | POA: Diagnosis not present

## 2020-03-17 LAB — GLUCOSE, CAPILLARY
Glucose-Capillary: 148 mg/dL — ABNORMAL HIGH (ref 70–99)
Glucose-Capillary: 216 mg/dL — ABNORMAL HIGH (ref 70–99)
Glucose-Capillary: 109 mg/dL — ABNORMAL HIGH (ref 70–99)
Glucose-Capillary: 247 mg/dL — ABNORMAL HIGH (ref 70–99)

## 2020-03-17 MED ORDER — FUROSEMIDE 40 MG PO TABS
40.0000 mg | ORAL_TABLET | Freq: Every day | ORAL | Status: DC
  Administered 2020-03-18: 40 mg via ORAL
  Filled 2020-03-17: qty 1

## 2020-03-17 NOTE — Progress Notes (Signed)
PROGRESS NOTE    Morgan Beasley  OIZ:[id number] DOB: 01-29-1961 DOA: 02/23/2020 PCP: Riki Sheer, NP    Brief Narrative:  Morgan Beasley was admitted to the hospital with the working diagnosis of septic shock due to gram-negative bacteremia, related to left lower extremity cellulitis.  Prolonged hospitalization due to with delirium.  59 year old female with medical history for obesity, uncontrolled type 2 diabetes mellitus, coronary artery disease, systolic heart failure, pretension infundibular vascular disease who presented with left/foot pain.  On her initial physical examination blood pressure was 162/105, heart rate 123, temperature 100.2 F, respiratory rate 25, oxygen saturation 94%, she had labored breathing, decreased breath sounds bilaterally heart S1-S2, present, tachycardia soft abdomen, no lower extremity edema.  Unstageable plantar ulcer approximately 1.5 cm in diameter circumferential on the dorsal aspect of the left, above the first toe.  Patient was placed on broad-spectrum antibiotic therapy, blood cultures were positive for Serratia.  Her hospitalization has acute nonhemorrhagic acute kidney injury septic shock. Further work-up with transesophageal echocardiography showed a pulmonary valve vegetation. She responded well to antibiotic therapy.  Her hospitalization was prolonged due to altered mentation, metabolic encephalopathy, agitation and delirium patient required involuntary commitment for safety.  Psychiatry was consulted for further medical management.  1. Septic shock due to gram negative bacteremia, Serratia (present on admission), related to left lower extremity cellulitis. Peripheral vascular disease. Her wbc has remained stable at 8.1, last blood cultures from 11/05 are no growth.  She continue to be hemodynamically stable, shock has resolved.  Shock liver with elevated liver enzymes, now resolved.  Continue antibiotic therapy with cefepime to complete  on 03/21/20.   2. Acute CVA/ acute right occipital. Echocardiogram with no PFO.  Continue PT/OT and plan to transfer to SnF. Continue with clopidogrel for antiplatelet therapy and statin.   3. Pulmonary valve vegetation/ thrombus (catheter related) . No chest pain or hypoxemia, continue close monitoring.   4. Acute metabolic encephalopathy with delirium and aggressive behavior. Patient this am is seating at the edge of the bed, she is awake and alert, no confusion or agitation.  Continue with amitriptyline, buspirone, carbamazepine and olanzapine   5. Acute on chronic diastolic heart failure/ prolonged Qtc. Currently no signs of decompensation. EF is 40 to 33%, no systolic dysfunction. Continue blood pressure monitoring. Change furosemide to oral from Iv.   6. AKI on CKD satge 3a/ hypomagnesemia/ hypokalemia. No blood work today, her renal function has remained stable with cr at 1,28 and K at 3,6, Mg was 1,9 on 03/12/20.   Change furosemide to po and check renal function in am.   7. Uncontrolled T2DM, dyslipidemia.  Patient is tolerating po well, continue glucose cover and monitoring.  On basal insulin 40 units glargine, 2 units aspart with meals, and sliding scale.   8. COPD/ OSA/ acute hypoxic respiratory failure/ tobacco abuse. No clinical signs of exacerbation, continue oxymetry monitoring. Continue bronchodilator therapy with Breo.  On nicotine patch.   9. HTN. Continue blood pressure monitoring. Continue with metoprolol 50 mg Xl.   10. Left upper extremity superficial thrombosis/ left cephalic vein/ transvenous catheter thrombosis. Continue left upper extremity elevation.    11. Anemia of chronic disease.  Hgb is 9,9 and Hct at 33.4, no indication for transfusion.   Assessment & Plan:   Principal Problem:   Septic shock ([place]) Active Problems:   Uncontrolled diabetes mellitus with circulatory complication, with long-term current use of insulin (HCC)   COPD (chronic  obstructive pulmonary disease) ([place])  PVD (peripheral vascular disease) (HCC)   Ischemia of left lower extremity   Chronic systolic CHF (congestive heart failure) (HCC)   Obesity (BMI 30-39.9)   Cerebral embolism with cerebral infarction   Status is: Inpatient  Remains inpatient appropriate because:Inpatient level of care appropriate due to severity of illness   Dispo: The patient is from: Home              Anticipated d/c is to: SNF              Anticipated d/c date is: 1 day              Patient currently is not medically stable to d/c.   DVT prophylaxis: Heparin  Code Status:   full  Family Communication:  No family at the bedside      Nutrition Status: Nutrition Problem: Increased nutrient needs Etiology: wound healing Signs/Symptoms: estimated needs Interventions: Magic cup, MVI, Snacks     Consultants:   ID   Neurology   Psychiatry      Antimicrobials:   Cefepime     Subjective: Patient is feeling better, no confusion or agitation, no nausea or vomiting. Positive left upper extremity edema, no significant pain at the left foot.   Objective: Vitals:   03/16/20 1058 03/16/20 1637 03/16/20 2006 03/17/20 0540  BP: (!) 183/132 (!) 146/83 (!) 152/94 92/69  Pulse:   (!) 111 63  Resp:  16 18 16   Temp:  98.4 F (36.9 C) 99 F (37.2 C) 98.3 F (36.8 C)  TempSrc:  Oral Oral Oral  SpO2:  100% 97% 93%  Weight:      Height:        Intake/Output Summary (Last 24 hours) at 03/17/2020 0919 Last data filed at 03/17/2020 0852 Gross per 24 hour  Intake 360 ml  Output 1350 ml  Net -990 ml   Filed Weights   02/23/20 0937 03/04/20 0741  Weight: 90.3 kg 86.2 kg    Examination:   General: Not in pain or dyspnea. Deconditioned  Neurology: Awake and alert, non focal  E ENT: mild pallor, no icterus, oral mucosa moist Cardiovascular: No JVD. S1-S2 present, rhythmic, no gallops, rubs, or murmurs. Positive non pitting bilateral ++ lower extremity edema.  Left upper extremity +++ edema,  Pulmonary: positive breath sounds bilaterally, adequate air movement, no wheezing, rhonchi or rales. Gastrointestinal. Abdomen soft and non tender Skin. Positive large stege 3 ulcerated lesion at the dorsum of the left foot, no erythema or purulence.  Musculoskeletal: no joint deformities     Data Reviewed: I have personally reviewed following labs and imaging studies  CBC: Recent Labs  Lab 03/12/20 0659 03/13/20 0622 03/14/20 0540 03/15/20 0317 03/16/20 0315  WBC 7.5 6.0 8.6 7.5 8.1  NEUTROABS 4.1 3.1 5.7 5.0 5.0  HGB 9.6* 9.9* 9.6* 9.5* 9.9*  HCT 32.3* 33.1* 31.6* 31.2* 33.4*  MCV 81.0 82.8 80.0 80.8 81.9  PLT 344 343 326 309 193   Basic Metabolic Panel: Recent Labs  Lab 03/11/20 0240 03/12/20 0659 03/13/20 0622 03/16/20 0315  NA 140 138 139 142  K 3.3* 4.3 4.1 3.6  CL 103 102 102 101  CO2 30 24 29 31   GLUCOSE 201* 252* 126* 107*  BUN 18 21* 23* 22*  CREATININE 1.17* 1.38* 1.29* 1.28*  CALCIUM 8.0* 8.6* 8.8* 9.1  MG 1.2* 1.9  --   --    GFR: Estimated Creatinine Clearance: 53 mL/min (A) (by C-G formula based on SCr of 1.28  mg/dL (H)). Liver Function Tests: Recent Labs  Lab 03/11/20 0240 03/12/20 0659 03/13/20 0622  AST 44* 31 24  ALT 55* 49* 41  ALKPHOS 220* 220* 212*  BILITOT 0.5 0.5 0.5  PROT 5.9* 6.6 6.6  ALBUMIN 1.8* 2.0* 1.9*   No results for input(s): LIPASE, AMYLASE in the last 168 hours. No results for input(s): AMMONIA in the last 168 hours. Coagulation Profile: No results for input(s): INR, PROTIME in the last 168 hours. Cardiac Enzymes: No results for input(s): CKTOTAL, CKMB, CKMBINDEX, TROPONINI in the last 168 hours. BNP (last 3 results) Recent Labs    12/19/19 1546 02/09/20 0825  PROBNP 1,148* 1,185*   HbA1C: No results for input(s): HGBA1C in the last 72 hours. CBG: Recent Labs  Lab 03/16/20 0913 03/16/20 1216 03/16/20 1726 03/16/20 2143 03/17/20 0812  GLUCAP 112* 174* 184* 220* 148*    Lipid Profile: No results for input(s): CHOL, HDL, LDLCALC, TRIG, CHOLHDL, LDLDIRECT in the last 72 hours. Thyroid Function Tests: No results for input(s): TSH, T4TOTAL, FREET4, T3FREE, THYROIDAB in the last 72 hours. Anemia Panel: No results for input(s): VITAMINB12, FOLATE, FERRITIN, TIBC, IRON, RETICCTPCT in the last 72 hours.    Radiology Studies: I have reviewed all of the imaging during this hospital visit personally     Scheduled Meds: . amitriptyline  100 mg Oral QHS  . atorvastatin  20 mg Oral Daily  . busPIRone  10 mg Oral BID  . carbamazepine  100 mg Oral BID  . Chlorhexidine Gluconate Cloth  6 each Topical Daily  . clopidogrel  75 mg Oral Daily  . collagenase   Topical Daily  . fluticasone furoate-vilanterol  1 puff Inhalation Daily  . furosemide  40 mg Intravenous BID  . gabapentin  400 mg Oral TID  . heparin injection (subcutaneous)  5,000 Units Subcutaneous Q8H  . insulin aspart  0-15 Units Subcutaneous TID WC  . insulin aspart  0-5 Units Subcutaneous QHS  . insulin aspart  2 Units Subcutaneous TID WC  . insulin glargine  40 Units Subcutaneous Daily  . lipase/protease/amylase  72,000 Units Oral TID WC  . metoprolol succinate  50 mg Oral Daily  . multivitamin with minerals  1 tablet Oral Daily  . nicotine  7 mg Transdermal Daily  . pantoprazole  40 mg Oral Daily   Continuous Infusions: . ceFEPime (MAXIPIME) IV 2 g (03/17/20 2671)  . lactated ringers 10 mL/hr at 03/04/20 0756     LOS: 23 days        Jakyria Bleau Gerome Apley, MD

## 2020-03-17 NOTE — Plan of Care (Signed)
  Problem: Education: Goal: Knowledge of General Education information will improve Description: Including pain rating scale, medication(s)/side effects and non-pharmacologic comfort measures Outcome: Progressing   Problem: Health Behavior/Discharge Planning: Goal: Ability to manage health-related needs will improve Outcome: Progressing   Problem: Clinical Measurements: Goal: Ability to maintain clinical measurements within normal limits will improve Outcome: Progressing Goal: Will remain free from infection Outcome: Progressing Goal: Diagnostic test results will improve Outcome: Progressing Goal: Respiratory complications will improve Outcome: Progressing Goal: Cardiovascular complication will be avoided Outcome: Progressing   Problem: Activity: Goal: Risk for activity intolerance will decrease Outcome: Progressing   Problem: Nutrition: Goal: Adequate nutrition will be maintained Outcome: Progressing   Problem: Coping: Goal: Level of anxiety will decrease Outcome: Progressing   Problem: Elimination: Goal: Will not experience complications related to bowel motility Outcome: Progressing   Problem: Pain Managment: Goal: General experience of comfort will improve Outcome: Progressing   Problem: Skin Integrity: Goal: Risk for impaired skin integrity will decrease Outcome: Progressing   Problem: Education: Goal: Ability to describe self-care measures that may prevent or decrease complications (Diabetes Survival Skills Education) will improve Outcome: Progressing Goal: Individualized Educational Video(s) Outcome: Progressing   Problem: Coping: Goal: Ability to adjust to condition or change in health will improve Outcome: Progressing   Problem: Fluid Volume: Goal: Ability to maintain a balanced intake and output will improve Outcome: Progressing   Problem: Metabolic: Goal: Ability to maintain appropriate glucose levels will improve Outcome: Progressing    Problem: Nutritional: Goal: Maintenance of adequate nutrition will improve Outcome: Progressing Goal: Progress toward achieving an optimal weight will improve Outcome: Progressing   Problem: Skin Integrity: Goal: Risk for impaired skin integrity will decrease Outcome: Progressing

## 2020-03-17 NOTE — TOC Progression Note (Signed)
Transition of Care [facility]) - Progression Note    Patient Details  Name: Meha Vidrine MRN: [id number] Date of Birth: 1960/07/22  [facility] CM/SW Contact  [name], LCSW Phone Number: 03/17/2020, 11:40 AM  Clinical Narrative:   CSW spoke with daughter [name] and pt sister Neecy.  Discussed potential bed offers currently listed in the hub.  They would like to bring the pt home when she is done with her IV antibiotics rather than pursue placement at locations that are distant.  Discussed HH.  Pt currently has [place] aide 3 hours per day through [facility] and they will contact them about options to increase those hours.    Expected Discharge Plan: [place] Barriers to Discharge: No Barriers Identified  Expected Discharge Plan and Services Expected Discharge Plan: [place] Choice: [place] arrangements for the past 2 months: Single Family Home                                       Social Determinants of Health (SDOH) Interventions    Readmission Risk Interventions Readmission Risk Prevention Plan 10/21/2019  Transportation Screening Complete  Medication Review ([place]) Complete  PCP or Specialist appointment within 3-5 days of discharge Complete  [place]RI or [place] Complete  SW Recovery Care/Counseling Consult Complete  [place] Not Applicable  Some recent data might be hidden

## 2020-03-17 NOTE — Progress Notes (Signed)
OT Cancellation Note  Patient Details Name: Morgan Beasley MRN: [id number] DOB: 1961/04/05   Cancelled Treatment:    Reason Eval/Treat Not Completed: Other (comment). Pt resting comfortably, asleep in bed. Some agitation earlier this morning per sitter. Will prioritize rest and limit disruptions to her routine, and try to time reattempt with pt being awake.   Tyrone Schimke, OT Acute Rehabilitation Services Pager: [phone] Office: [phone]  03/17/2020, 11:59 AM

## 2020-03-18 LAB — BASIC METABOLIC PANEL
Anion gap: 8 (ref 5–15)
BUN: 24 mg/dL — ABNORMAL HIGH (ref 6–20)
CO2: 32 mmol/L (ref 22–32)
Calcium: 8.6 mg/dL — ABNORMAL LOW (ref 8.9–10.3)
Chloride: 100 mmol/L (ref 98–111)
Creatinine, Ser: 1.32 mg/dL — ABNORMAL HIGH (ref 0.44–1.00)
GFR, Estimated: 47 mL/min — ABNORMAL LOW (ref >60)
Glucose, Bld: 195 mg/dL — ABNORMAL HIGH (ref 70–99)
Potassium: 3.9 mmol/L (ref 3.5–5.1)
Sodium: 140 mmol/L (ref 135–145)

## 2020-03-18 LAB — GLUCOSE, CAPILLARY
Glucose-Capillary: 136 mg/dL — ABNORMAL HIGH (ref 70–99)
Glucose-Capillary: 262 mg/dL — ABNORMAL HIGH (ref 70–99)
Glucose-Capillary: 101 mg/dL — ABNORMAL HIGH (ref 70–99)

## 2020-03-18 MED ORDER — GABAPENTIN 400 MG PO CAPS: 400.0000 mg | ORAL_CAPSULE | Freq: Three times a day (TID) | ORAL | 0 refills | Status: DC

## 2020-03-18 MED ORDER — ATORVASTATIN CALCIUM 20 MG PO TABS: 20.0000 mg | ORAL_TABLET | Freq: Every day | ORAL | 0 refills | Status: DC

## 2020-03-18 MED ORDER — FUROSEMIDE 40 MG PO TABS: 40.0000 mg | ORAL_TABLET | Freq: Every day | ORAL | 0 refills | Status: DC

## 2020-03-18 MED ORDER — MORPHINE SULFATE (PF) 2 MG/ML IV SOLN
2.0000 mg | INTRAVENOUS | Status: DC | PRN
  Administered 2020-03-18: 2 mg via INTRAVENOUS
  Filled 2020-03-18: qty 1

## 2020-03-18 NOTE — TOC Transition Note (Signed)
Transition of Care [facility]) - CM/SW Discharge Note   Patient Details  Name: Morgan Beasley MRN: [id number] Date of Birth: 05/21/1960  [facility]) CM/SW Contact:  [name], LCSW Phone Number: 03/18/2020, 3:34 PM   Clinical Narrative: Pt discharging home.  [facility]ynn Sayers of [facility] set up home infusion services.  [facility] to provide OT/PT.  [place] will provide nursing.  DME/wheelchair provided by [facility].  Family requested hospital bed but all agencies did not have any in stock.  Phone calls made to [place], [facility], [facility], and Choice.  Spoke with family who is aware and will wait for bed to come in stock. [facility] will maintain contact with family about this and order was put in.  Daughter will transport home.      Final next level of care: Skilled Nursing Facility Barriers to Discharge: No Barriers Identified   Patient Goals and CMS Choice Patient states their goals for this hospitalization and ongoing recovery are:: I want to keep my foot CMS Medicare.gov Compare Post Acute Care list provided to:: Patient Choice offered to / list presented to : Patient  Discharge Placement              Patient [facility] bed at: [facility] Patient to be transferred to facility by: [name] Name of family member notified: [name] [phone] Patient and family notified of of transfer: 03/07/20  Discharge Plan and Services     Post Acute Care Choice: [name]          DME Arranged: Wheelchair manual DME Agency: [facility] Date DME Agency Contacted: 03/18/20 Time DME Agency Contacted: [phone] Representative spoke with at DME Agency: [place]: RN, PT, OT HH Agency: [facility] [place] Date [place]: 03/18/20 Time [place]: 1515 Representative spoke with at [place]: Amy (through [facility] Sayers)  Social Determinants of Health ([place]) Interventions      Readmission Risk Interventions Readmission Risk Prevention Plan 03/18/2020 10/21/2019  Transportation Screening Complete Complete  Medication Review [employer]) - Complete  PCP or Specialist appointment within 3-5 days of discharge Not Complete Complete  PCP/Specialist Appt Not Complete comments First available appt 12/1 -  [place] or [place] Complete Complete  SW Recovery Care/Counseling Consult Complete Complete  Palliative Care Screening Not Applicable Not [place] Not Applicable Not Applicable  Some recent data might be hidden

## 2020-03-18 NOTE — Discharge Summary (Addendum)
Physician Discharge Summary  Morgan Beasley EXN:[id number] DOB: 1960/11/09 DOA: 02/23/2020  PCP: Riki Sheer, NP  Admit date: 02/23/2020 Discharge date: 03/18/2020  Admitted From: Home  Disposition:  Home   Recommendations for Outpatient Follow-up and new medication changes:  1. Follow up with Andy Gauss NP in 7 days.  2. Continue antibiotic therapy with Cefepime until 03/21/20.  3. Follow up on renal function as outpatient.  4. Plan to resume losartan as outpatient.  5. Remove PICC line after completion of antibiotic therapy on 03/21/20.   Home Health: yes   Equipment/Devices: wheelchair   Discharge Condition: stable  CODE STATUS: full  Diet recommendation: heart healthy   Brief/Interim Summary: [name] admitted to the hospital with the working diagnosis of septic shock due to gram-negative bacteremia,related to left lower extremity cellulitis.Prolonged hospitalization due to metabolic encephalopathy with delirium.  59 year old female with medical history for obesity, uncontrolled type 2 diabetes mellitus, coronary artery disease, systolic heart failure, and peripheral vascular disease who presented with left/foot pain. On her initial physical examination blood pressure was 162/105, heart rate 123, temperature 100.2 F, respiratory rate 25, oxygen saturation 94%, she had labored breathing, decreased breath sounds bilaterally heart S1-S2, present, tachycardic. soft abdomen, no lower extremity edema.Unstageable plantar ulcer approximately 1.5 cm in diameter circumferential on thedorsal aspect of the left,above the first toe.  Sodium 133, potassium 3.7, chloride 97, bicarb 26, glucose 439, BUN 16, creatinine 1.17, lactic acid 2.6, white count 11.4, hemoglobin 12.4, hematocrit 40.4, platelets 419.  SARS COVID-19 negative.  Urinalysis more than 500 glucose, (300 protein, specific gravity 1.021, red cells 11-20, white cells 0-5 Chest radiograph with left  rotation, bibasilar atelectasis. EKG 118 bpm, normal axis, QTc 555, sinus rhythm, no significant ST segment or T wave changes.  Patient was placed on broad-spectrum antibiotic therapy, blood cultures were positive for Serratia.  Her hospitalization has complicated by acute nonhemorrhagic CVA, acute kidney injury and septic shock. Further work-up with transesophageal echocardiography showed a pulmonary valve vegetation. She responded well to antibiotic therapy, with improvement in hemodynamics and renal function.   Patient with persistent ambulatory dysfunction.   Her hospitalization was prolonged due to altered mentation, metabolic encephalopathy, agitation and delirium patient required involuntary commitment for safety. Psychiatry was consulted for further medical management.  It was recommended SNF for further care but her daughters decided to take her home with home health services.    1.  Septic shock due to gram-negative bacteremia, Serratia, present on admission, related to left lower extremity cellulitis, peripheral vascular disease.  Patient responded well to medical therapy with intravenous antibiotics.  Plan to continue cefepime until 03/21/2020.  Patient will have home health services.  For her peripheral vascular disease she will follow-up with Dr. Donzetta Matters at discharge from vascular surgery, continue statin and clopidogrel. Patient had a prior left femoral pop bypass, and angioplasty 12/2019 to left TP trunk, PT and AT artery.   During this hospitalization patient underwent CT angiography abdominal aorta with iliofemoral runoff, it did show patent left lower extremity femoral to below-knee popliteal artery bypass graft.  Progressive peripheral vascular disease involving the right lower extremity.  Complete occlusion of the mid to distal right superficial femoral artery.  2.  Acute CVA, right occipital, nonischemic.  Due to altered mentation patient underwent head CT which showed  decreased attenuation in the right temporal occipital cortex and left occipital cortex.  Further work-up with brain MRI/ MRA showed acute infarction in the lateral right occipital lobe with some  parietotemporal extension.  Chronic left occipital infarct.  No large vessel occlusion  Patient was seen by neurology, continue clopidogrel and statin.  3.  Pulmonary valve vegetation/thrombus, catheter related/ superficial vein thrombosis left cephalic vein.  Echocardiography showed a thrombus distal to the SVC related to transvenous catheter, left ventricle ejection fraction was 40%.  He had a probably small pulmonary valve vegetation, mobile echodensity on the PA aspect of the pulmonary valve. Bubble study negative.  Ultrasonography of the upper extremities showed negative DVT on the right, on the left superficial vein thrombosis involving the left cephalic vein, but no deep vein thrombosis  The PICC line will be removed on 11/21 after completion of antibiotic therapy.  4.  Acute metabolic encephalopathy with delirium and aggressive behavior.  Patient suffered from severe delirium, she required one-to-one precautions and a psychiatric evaluation.  Patient was placed on amitriptyline buspirone, carbamazepine and olanzapine.  At time of discharge she is awake and alert, cooperative, no further agitation.  Continue with amitriptyline at discharge. Avoid Qtc prolonging medications.    5.  Acute on chronic diastolic heart failure, prolonged QTC.  Ejection fraction 40 to 56%, no systolic dysfunction.  Patient was diuresed with furosemide with good toleration. Continue blood pressure monitoring at home.  Continue with daily furosemide for now.  Discharge Qtc is 519, continue to avoid qtc prolonging medications.   6.  Acute kidney injury on chronic disease stage IIIa, hypomagnesemia, hypokalemia.  Patient received supportive medical therapy including intravenous fluids and posteriorly diuretics.  Her  kidney function has remained stable.  7.  Uncontrolled type II diabetes mellitus, dyslipidemia.  She was placed on insulin sliding scale for glucose coverage monitoring with good toleration. Received basal insulin 40 units of glargine plus premeal coverage.  At discharge resume dapagliflozin   Initially statin was held due to elevated LFTs, at discharge this will be resumed.  8.  COPD/OSA/acute hypoxic respiratory failure/tobacco abuse.  Supportive medical therapy, diuresis and bronchodilators.  Oxygenation improved.  At discharge patient is on room air. Continue smoking cessation, she had nicotine patch while hospitalized.  9.  Hypertension.  Blood pressure control with metoprolol succinate 50 mg daily.  10.  Anemia of chronic disease.  Her hemoglobin and hematocrit remained stable.   Discharge Diagnoses:  Principal Problem:   Septic shock ([place]) Active Problems:   Uncontrolled diabetes mellitus with circulatory complication, with long-term current use of insulin (HCC)   COPD (chronic obstructive pulmonary disease) (HCC)   PVD (peripheral vascular disease) (HCC)   Ischemia of left lower extremity   Chronic systolic CHF (congestive heart failure) (HCC)   Obesity (BMI 30-39.9)   Cerebral embolism with cerebral infarction    Discharge Instructions  Discharge Instructions    [facility] pharmacist to adjust dose for Vancomycin, Aminoglycosides and other anti-infective therapies as requested by physician.   Complete by: As directed    [facility] to provide Cath Flo 69m   Complete by: As directed    Administer for PICC line occlusion and as ordered by physician for other access device issues.   Anaphylaxis Kit: Provided to treat any anaphylactic reaction to the medication being provided to the patient if First Dose or when requested by physician   Complete by: As directed    Epinephrine 1240mml vial / amp: Administer 0.40m31m0.40ml65mubcutaneously once for  moderate to severe anaphylaxis, nurse to call physician and pharmacy when reaction occurs and call 911 if needed for immediate care   Diphenhydramine 50mg72mIV  vial: Administer 25-48m IV/IM PRN for first dose reaction, rash, itching, mild reaction, nurse to call physician and pharmacy when reaction occurs   Sodium Chloride 0.9% NS 5024mIV: Administer if needed for hypovolemic blood pressure drop or as ordered by physician after call to physician with anaphylactic reaction   Change dressing on IV access line weekly and PRN   Complete by: As directed    Flush IV access with Sodium Chloride 0.9% and Heparin 10 units/ml or 100 units/ml   Complete by: As directed    Home infusion instructions - [facility]   Complete by: As directed    Instructions: Flush IV access with Sodium Chloride 0.9% and Heparin 10units/ml or 100units/ml   Change dressing on IV access line: Weekly and PRN   Instructions Cath Flo 27m17mAdminister for PICC Line occlusion and as ordered by physician for other access device   [facility] pharmacist to adjust dose for: Vancomycin, Aminoglycosides and other anti-infective therapies as requested by physician   Method of administration may be changed at the discretion of home infusion pharmacist based upon assessment of the patient and/or caregiver's ability to self-administer the medication ordered   Complete by: As directed      Allergies as of 03/18/2020      Reactions   Tramadol Swelling   Nsaids Other (See Comments)   Pancreatitis   Tolmetin Other (See Comments)   Pancreatitis   Tylenol [acetaminophen] Other (See Comments)   unknown   Aspirin Other (See Comments)   "Makes my pancreas act up"       Medication List    STOP taking these medications   ezetimibe 10 MG tablet Commonly known as: ZETIA   gabapentin 800 MG tablet Commonly known as: NEURONTIN Replaced by: gabapentin 400 MG capsule   glimepiride 4 MG tablet Commonly known as:  AMARYL   losartan 50 MG tablet Commonly known as: COZAAR   rosuvastatin 40 MG tablet Commonly known as: CRESTOR   Toremifene Citrate 60 MG tablet Commonly known as: FARESTON   torsemide 20 MG tablet Commonly known as: DEMADEX     TAKE these medications   Adult One Daily Gummies Chew Chew 1 capsule by mouth daily.   amitriptyline 100 MG tablet Commonly known as: ELAVIL Take 100 mg by mouth at bedtime.   atorvastatin 20 MG tablet Commonly known as: LIPITOR Take 1 tablet (20 mg total) by mouth daily. Start taking on: March 19, 2020   budesonide-formoterol 160-4.5 MCG/ACT inhaler Commonly known as: SYMBICORT Inhale 2 puffs into the lungs 2 (two) times daily.   ceFEPime  IVPB Commonly known as: MAXIPIME Inject 2 g into the vein every 12 (twelve) hours for 16 days. Indication:  Endocarditis  First Dose: No Last Day of Therapy:  03/21/2020 Labs - Once weekly:  CBC/D and BMP, Labs - Every other week:  ESR and CRP Method of administration: IV Push Method of administration may be changed at the discretion of home infusion pharmacist based upon assessment of the patient and/or caregiver's ability to self-administer the medication ordered.   clopidogrel 75 MG tablet Commonly known as: Plavix Take 1 tablet (75 mg total) by mouth daily.   dapagliflozin propanediol 10 MG Tabs tablet Commonly known as: FARXIGA Take 1 tablet (10 mg total) by mouth daily before breakfast.   furosemide 40 MG tablet Commonly known as: LASIX Take 1 tablet (40 mg total) by mouth daily. Start taking on: March 19, 2020 What changed: how much to take  gabapentin 400 MG capsule Commonly known as: NEURONTIN Take 1 capsule (400 mg total) by mouth 3 (three) times daily for 30 doses. Replaces: gabapentin 800 MG tablet   HumaLOG 100 UNIT/ML injection Generic drug: insulin lispro Inject 14 Units into the skin 3 (three) times daily before meals.   insulin glargine 100 UNIT/ML  injection Commonly known as: Lantus Inject 0.4 mLs (40 Units total) into the skin daily. What changed: how much to take   lipase/protease/amylase 36000 UNITS Cpep capsule Commonly known as: Creon Take 2 capsule by mouth with each meal and 1 capsule by mouth with each snack What changed:   how much to take  how to take this  when to take this  additional instructions   LORazepam 0.5 MG tablet Commonly known as: ATIVAN Take 1 tablet (0.5 mg total) by mouth every 6 (six) hours as needed for anxiety.   metoprolol succinate 50 MG 24 hr tablet Commonly known as: TOPROL-XL Take 1 tablet (50 mg total) by mouth daily. Take with or immediately following a meal. What changed:   when to take this  additional instructions   Narcan 4 MG/0.1ML Liqd nasal spray kit Generic drug: naloxone Place 1 spray into the nose as needed (opioid overdose).   potassium chloride 10 MEQ tablet Commonly known as: KLOR-CON Take 10 mEq by mouth daily as needed (For cramps).   ProAir HFA 108 (90 Base) MCG/ACT inhaler Generic drug: albuterol Inhale 2 puffs into the lungs every 6 (six) hours as needed for wheezing or shortness of breath.            Discharge Care Instructions  (From admission, onward)         Start     Ordered   03/07/20 0000  Change dressing on IV access line weekly and PRN  (Home infusion instructions - [facility] )        03/07/20 0819          Follow-up Information    [facility]. Schedule an appointment as soon as possible for a visit in 4 week(s).   Specialty: Neurology Contact information: [address] [place] [id number]       Waynetta Sandy, MD Follow up.   Specialties: Vascular Surgery, Cardiology Why: office will call you to make arrangements for follow-up appointment (sent) Contact information: [address] [place] [address] [phone]               Allergies  Allergen Reactions  . Tramadol Swelling  . Nsaids Other (See Comments)    Pancreatitis  . Tolmetin Other (See Comments)    Pancreatitis  . Tylenol [Acetaminophen] Other (See Comments)    unknown  . Aspirin Other (See Comments)    "Makes my pancreas act up"     Consultations:  Cardiology   Neurology   Psychiatry    Procedures/Studies: CT HEAD WO CONTRAST  Result Date: 02/29/2020 CLINICAL DATA:  Delirium. EXAM: CT HEAD WITHOUT CONTRAST TECHNIQUE: Contiguous axial images were obtained from the base of the skull through the vertex without intravenous contrast. COMPARISON:  December 17, 2016 FINDINGS: Brain: No subdural, epidural, or subarachnoid hemorrhage. Cerebellum, brainstem, and basal cisterns are normal. Ventricles and sulci are unremarkable. No mass effect or midline shift. Decreased attenuation involving the right temporal occipital cortex. There is also focal low-attenuation in the left occipital lobe on series 3, image 54. No other sites of infarct or ischemia identified. Vascular: Calcified atherosclerosis in the  intracranial carotids. Skull: Normal. Negative for fracture or focal lesion. Sinuses/Orbits: No acute finding. Other: None. IMPRESSION: 1. Decreased attenuation in the right temporal occipital cortex and in the left occipital cortex. The findings are consistent with age-indeterminate infarcts but are favored to be subacute. MRI could better assess. The presence of probable subacute infarcts in 2 vascular territories should raise the possibility of an embolic cause. 2. No other acute abnormalities. 3. No other acute abnormalities. These results will be called to the ordering clinician or representative by the Radiologist Assistant, and communication documented in the PACS or [employer]. Electronically Signed   By: Dorise Bullion III M.D   On: 02/29/2020 10:33   MR ANGIO HEAD WO CONTRAST  Result Date: 02/29/2020 CLINICAL DATA:  Abnormal CT EXAM: MRI  HEAD WITHOUT CONTRAST MRA HEAD WITHOUT CONTRAST MRA NECK WITHOUT CONTRAST TECHNIQUE: Multiplanar, multiecho pulse sequences of the brain and surrounding structures were obtained without intravenous contrast. Angiographic images of the Circle of Willis were obtained using MRA technique without intravenous contrast. Angiographic images of the neck were obtained using MRA technique without intravenous contrast. Carotid stenosis measurements (when applicable) are obtained utilizing NASCET criteria, using the distal internal carotid diameter as the denominator. COMPARISON:  None. FINDINGS: MRI HEAD Brain: There is restricted diffusion in the lateral right occipital lobe with some parietotemporal extension reflecting acute infarction. Chronic left occipital infarct. Few additional patchy foci of T2 hyperintensity in the supratentorial and pontine white matter are nonspecific but probably reflect mild chronic microvascular ischemic changes. Foci of susceptibility are present in the posterior left lentiform nucleus, left thalamus, and midline cerebellum likely reflecting chronic microhemorrhages. There is no intracranial mass or significant mass effect. There is no hydrocephalus or extra-axial fluid collection. Vascular: Major vessel flow voids at the skull base are preserved. Skull and upper cervical spine: Normal marrow signal is preserved. Sinuses/Orbits: Paranasal sinuses are aerated. Orbits are unremarkable. Other: Sella is unremarkable.  Mastoid air cells are clear. MRA HEAD Intracranial internal carotid arteries are patent. Middle and anterior cerebral arteries are patent. Included intracranial vertebral arteries, basilar artery, posterior cerebral arteries are patent. There is no significant stenosis or aneurysm. MRA NECK Included portions of the common carotid arteries are patent. Internal and external carotid arteries are patent. There is no hemodynamically significant stenosis at the ICA origins by NASCET  criteria. Included extracranial vertebral arteries are patent and codominant. IMPRESSION: Acute infarction of the lateral right occipital lobe with some parietotemporal extension. Chronic left occipital infarct. Mild chronic microvascular ischemic changes. No large vessel occlusion or hemodynamically significant stenosis. Electronically Signed   By: Macy Mis M.D.   On: 02/29/2020 13:40   MR ANGIO NECK WO CONTRAST  Result Date: 02/29/2020 CLINICAL DATA:  Abnormal CT EXAM: MRI HEAD WITHOUT CONTRAST MRA HEAD WITHOUT CONTRAST MRA NECK WITHOUT CONTRAST TECHNIQUE: Multiplanar, multiecho pulse sequences of the brain and surrounding structures were obtained without intravenous contrast. Angiographic images of the Circle of Willis were obtained using MRA technique without intravenous contrast. Angiographic images of the neck were obtained using MRA technique without intravenous contrast. Carotid stenosis measurements (when applicable) are obtained utilizing NASCET criteria, using the distal internal carotid diameter as the denominator. COMPARISON:  None. FINDINGS: MRI HEAD Brain: There is restricted diffusion in the lateral right occipital lobe with some parietotemporal extension reflecting acute infarction. Chronic left occipital infarct. Few additional patchy foci of T2 hyperintensity in the supratentorial and pontine white matter are nonspecific but probably reflect mild chronic microvascular ischemic changes. Foci of  susceptibility are present in the posterior left lentiform nucleus, left thalamus, and midline cerebellum likely reflecting chronic microhemorrhages. There is no intracranial mass or significant mass effect. There is no hydrocephalus or extra-axial fluid collection. Vascular: Major vessel flow voids at the skull base are preserved. Skull and upper cervical spine: Normal marrow signal is preserved. Sinuses/Orbits: Paranasal sinuses are aerated. Orbits are unremarkable. Other: Sella is unremarkable.   Mastoid air cells are clear. MRA HEAD Intracranial internal carotid arteries are patent. Middle and anterior cerebral arteries are patent. Included intracranial vertebral arteries, basilar artery, posterior cerebral arteries are patent. There is no significant stenosis or aneurysm. MRA NECK Included portions of the common carotid arteries are patent. Internal and external carotid arteries are patent. There is no hemodynamically significant stenosis at the ICA origins by NASCET criteria. Included extracranial vertebral arteries are patent and codominant. IMPRESSION: Acute infarction of the lateral right occipital lobe with some parietotemporal extension. Chronic left occipital infarct. Mild chronic microvascular ischemic changes. No large vessel occlusion or hemodynamically significant stenosis. Electronically Signed   By: Macy Mis M.D.   On: 02/29/2020 13:40   MR BRAIN WO CONTRAST  Result Date: 02/29/2020 CLINICAL DATA:  Abnormal CT EXAM: MRI HEAD WITHOUT CONTRAST MRA HEAD WITHOUT CONTRAST MRA NECK WITHOUT CONTRAST TECHNIQUE: Multiplanar, multiecho pulse sequences of the brain and surrounding structures were obtained without intravenous contrast. Angiographic images of the Circle of Willis were obtained using MRA technique without intravenous contrast. Angiographic images of the neck were obtained using MRA technique without intravenous contrast. Carotid stenosis measurements (when applicable) are obtained utilizing NASCET criteria, using the distal internal carotid diameter as the denominator. COMPARISON:  None. FINDINGS: MRI HEAD Brain: There is restricted diffusion in the lateral right occipital lobe with some parietotemporal extension reflecting acute infarction. Chronic left occipital infarct. Few additional patchy foci of T2 hyperintensity in the supratentorial and pontine white matter are nonspecific but probably reflect mild chronic microvascular ischemic changes. Foci of susceptibility are  present in the posterior left lentiform nucleus, left thalamus, and midline cerebellum likely reflecting chronic microhemorrhages. There is no intracranial mass or significant mass effect. There is no hydrocephalus or extra-axial fluid collection. Vascular: Major vessel flow voids at the skull base are preserved. Skull and upper cervical spine: Normal marrow signal is preserved. Sinuses/Orbits: Paranasal sinuses are aerated. Orbits are unremarkable. Other: Sella is unremarkable.  Mastoid air cells are clear. MRA HEAD Intracranial internal carotid arteries are patent. Middle and anterior cerebral arteries are patent. Included intracranial vertebral arteries, basilar artery, posterior cerebral arteries are patent. There is no significant stenosis or aneurysm. MRA NECK Included portions of the common carotid arteries are patent. Internal and external carotid arteries are patent. There is no hemodynamically significant stenosis at the ICA origins by NASCET criteria. Included extracranial vertebral arteries are patent and codominant. IMPRESSION: Acute infarction of the lateral right occipital lobe with some parietotemporal extension. Chronic left occipital infarct. Mild chronic microvascular ischemic changes. No large vessel occlusion or hemodynamically significant stenosis. Electronically Signed   By: Macy Mis M.D.   On: 02/29/2020 13:40   CT ANGIO AO+BIFEM W & OR WO CONTRAST  Result Date: 02/24/2020 CLINICAL DATA:  Chest pain.  Peripheral vascular disease. EXAM: CT ANGIOGRAPHY OF ABDOMINAL AORTA WITH ILIOFEMORAL RUNOFF CT ANGIOGRAPHY OF ABDOMINAL AORTA WITH ILIOFEMORAL RUNOFF TECHNIQUE: Multidetector CT imaging of the abdomen, pelvis and lower extremities was performed using the standard protocol during bolus administration of intravenous contrast. Multiplanar CT image reconstructions and MIPs were obtained to evaluate  the vascular anatomy. Multidetector CT imaging of the abdomen, pelvis and lower  extremities was performed using the standard protocol during bolus administration of intravenous contrast. Multiplanar CT image reconstructions and MIPs were obtained to evaluate the vascular anatomy. CONTRAST:  140m OMNIPAQUE IOHEXOL 350 MG/ML SOLN COMPARISON:  CT dated October 09, 2019. CT PE study dated 02/25/2019 FINDINGS: CHEST Cardiovascular: There is no evidence for large centrally located pulmonary embolism. Detection of smaller pulmonary emboli is limited by technique. There is no evidence for a thoracic aortic dissection or aneurysm. The heart size is enlarged. There is no significant pericardial effusion. There are mild coronary artery calcifications. Mediastinum/Nodes: --there are enlarged mediastinal lymph nodes. For example there is a pretracheal lymph node measuring approximately 1.8 x 2.9 cm (axial series 7, image 40). This is relatively similar to prior study. --mild hilar adenopathy is noted. -- No axillary lymphadenopathy. --there is significant right supraclavicular adenopathy which has progressed since the prior study. The dominant lymph node measures 1.2 by 3 point 4 cm (axial series 7, image 15). -- Normal thyroid gland where visualized. -  Unremarkable esophagus. Lungs/Pleura: There is a moderate to large right-sided pleural effusion. There is a small left-sided pleural effusion. There is bibasilar atelectasis. There is no pneumothorax. Musculoskeletal: No chest wall abnormality. No bony spinal canal stenosis. Review of the MIP images confirms the above findings. VASCULAR Aorta: There are atherosclerotic changes of the abdominal aorta without evidence for an aneurysm. Celiac: Patent without evidence of aneurysm, dissection, vasculitis or significant stenosis. SMA: Patent without evidence of aneurysm, dissection, vasculitis or significant stenosis. Renals: Both renal arteries are patent without evidence of aneurysm, dissection, vasculitis, fibromuscular dysplasia or significant stenosis. IMA:  Patent without evidence of aneurysm, dissection, vasculitis or significant stenosis. RIGHT Lower Extremity Inflow: Common, internal and external iliac arteries are patent without evidence of aneurysm, dissection, vasculitis or significant stenosis. Outflow: The right common femoral artery is patent. The right SFA is occluded proximally. There is near immediate reconstitution. There are tandem high-grade areas of stenosis throughout the right SFA. There is new complete occlusion of the mid to distal right SFA (axial series 7, image 247). Evaluation of the right popliteal artery is limited, however there appears to be high-grade stenosis throughout the vessel. Runoff: There is a high-grade stenosis of the proximal right anterior tibial artery. There appears to be a single vessel runoff to the right lower extremity via the anterior tibial artery. LEFT Lower Extremity Inflow: Common, internal and external iliac arteries are patent without evidence of aneurysm, dissection, vasculitis or significant stenosis. Outflow: The left CFA is widely patent. The proximal left SFA is entirely occluded. There is a new left femoral to below knee popliteal artery bypass graft. The graft is widely patent throughout its course. The profunda femoris artery is patent. Runoff: Heavy calcifications limit evaluation of the tibial vasculature. However there appears to be a 2 vessel runoff via the anterior tibial and posterior tibial arteries. The peroneal artery is occluded. Veins: The veins are not well evaluated on this study. Review of the MIP images confirms the above findings. NON-VASCULAR Hepatobiliary: There is hepatomegaly with likely underlying hepatic steatosis. There appears to be gallbladder wall thickening with pericholecystic free fluid.There is no biliary ductal dilation. Pancreas: Normal contours without ductal dilatation. No peripancreatic fluid collection. Spleen: Unremarkable. Adrenals/Urinary Tract: --Adrenal glands:  Unremarkable. --Right kidney/ureter: No hydronephrosis or radiopaque kidney stones. --Left kidney/ureter: No hydronephrosis or radiopaque kidney stones. --Urinary bladder: Unremarkable. Stomach/Bowel: --Stomach/Duodenum: No hiatal hernia or other gastric  abnormality. Normal duodenal course and caliber. --Small bowel: Unremarkable. --Colon: Unremarkable. --Appendix: Normal. Lymphatic: --No retroperitoneal lymphadenopathy. --No mesenteric lymphadenopathy. --there is mild bilateral inguinal adenopathy, left worse than right. Reproductive: Unremarkable Other: No ascites or free air. The abdominal wall is normal. Musculoskeletal. There are postsurgical changes of the left inguinal region. There is bilateral nonspecific lower extremity edema, left worse than right. There are small bilateral suprapatellar joint effusions. IMPRESSION: 1. Patent left lower extremity femoral to below knee popliteal artery bypass graft. Evaluation of the left tibial vasculature is limited by calcifications, however there appears to be a 2 vessel runoff to the left ankle via the anterior tibial and posterior tibial arteries. 2. Progressive peripheral vascular disease involving the right lower extremity. There is now complete occlusion of the mid to distal right superficial femoral artery, new since prior study. There are now appears to be a single vessel runoff to the right foot via the anterior tibial artery. In June, there was a 3 vessel runoff to the right foot. 3. Nonspecific, asymmetric bilateral lower extremity edema (left worse than right). This is of unknown clinical significance. This study cannot adequately exclude a DVT. If there is clinical suspicion for a DVT, follow-up with ultrasound is recommended. 4. No evidence for an acute pulmonary embolism. No evidence for thoracic aortic dissection or aneurysm. 5. Cardiomegaly with small to moderate-sized bilateral pleural effusions, right worse than left. 6. Persistent mediastinal and  hilar adenopathy with worsening right supraclavicular adenopathy. Findings may be reactive or due to a lymphoproliferative disorder or metastatic disease. Follow-up is recommended. Of note, the right supraclavicular lymph node is amenable to percutaneous ultrasound-guided biopsy if clinically indicated. 7. Hepatomegaly with likely underlying hepatic steatosis. 8.  Aortic Atherosclerosis (ICD10-I70.0). Electronically Signed   By: Constance Holster M.D.   On: 02/24/2020 18:13   US RENAL  Result Date: 02/26/2020 CLINICAL DATA:  Acute kidney injury. EXAM: RENAL / URINARY TRACT ULTRASOUND COMPLETE COMPARISON:  CT angiogram of the abdomen and pelvis from 02/24/2020. FINDINGS: Right Kidney: Renal measurements: 11.2 x 4.8 x 6.0 cm = volume: 169 mL. Mildly echogenic renal parenchyma, normal thickness. No hydronephrosis. No renal mass. Left Kidney: Renal measurements: 12.2 x 5.4 x 5.6 cm = volume: 184 mL. Mildly echogenic renal parenchyma, normal thickness. No hydronephrosis. No renal mass. Bladder: Appears normal for degree of bladder distention. Other: Incidentally noted diffuse gallbladder wall thickening. IMPRESSION: 1. No hydronephrosis. 2. Mildly echogenic normal size kidneys, compatible with reported history of nonspecific acute renal parenchymal disease. 3. Normal bladder. 4. Incidentally noted nonspecific diffuse gallbladder wall thickening. If there is clinical concern for acute cholecystitis, dedicated right upper quadrant abdominal sonogram could be obtained for further evaluation. Electronically Signed   By: Ilona Sorrel M.D.   On: 02/26/2020 10:20   DG CHEST PORT 1 VIEW  Result Date: 03/11/2020 CLINICAL DATA:  Hypoxia. EXAM: PORTABLE CHEST 1 VIEW COMPARISON:  02/27/2020 FINDINGS: There is artifact from multiple external objects overlying the chest including a round radiopaque object obscuring the right lung base. A left PICC remains in place terminating over the SVC. The cardiac silhouette remains  enlarged. There is persistent pulmonary vascular congestion with mixed interstitial and airspace opacities bilaterally, greatest in the lung bases and increased from the prior study. No large pleural effusion or pneumothorax is identified. No acute osseous abnormality is seen. IMPRESSION: Worsening bilateral lung opacities compatible with edema. Electronically Signed   By: Logan Bores M.D.   On: 03/11/2020 14:37   DG Chest Port 1  View  Result Date: 02/27/2020 CLINICAL DATA:  Short of breath EXAM: PORTABLE CHEST 1 VIEW COMPARISON:  02/26/2020 FINDINGS: Cardiac enlargement and vascular congestion. Mild improvement in bilateral airspace disease consistent with edema. No significant effusion. Left arm PICC tip in the SVC IMPRESSION: Congestive heart failure with mild interval improvement in edema. Electronically Signed   By: Franchot Gallo M.D.   On: 02/27/2020 08:02   DG Chest Port 1 View  Result Date: 02/26/2020 CLINICAL DATA:  Shortness of breath; history breast cancer, CHF, chronic kidney disease, COPD, diabetes mellitus, hypertension EXAM: PORTABLE CHEST 1 VIEW COMPARISON:  Portable exam 0729 hours compared to 02/25/2020 FINDINGS: LEFT arm PICC line tip projects over SVC. Enlargement of cardiac silhouette. Mediastinal contours normal. BILATERAL pulmonary infiltrates identified, basilar predominance, favor multifocal pneumonia over pulmonary edema. Subsegmental atelectasis lower LEFT lung. Question minimal LEFT pleural effusion. No pneumothorax. Endplate spur formation thoracic spine. IMPRESSION: Persistent BILATERAL pulmonary infiltrates, predominantly basilar, favoring multifocal pneumonia. Electronically Signed   By: Lavonia Dana M.D.   On: 02/26/2020 07:56   DG Chest Port 1 View  Result Date: 02/25/2020 CLINICAL DATA:  Shortness of breath, on BiPAP EXAM: PORTABLE CHEST 1 VIEW COMPARISON:  Portable exam at 1039 hrs compared to 02/23/2020 FINDINGS: Enlargement of cardiac silhouette with pulmonary  vascular congestion. Diffuse infiltrates throughout both lungs, greater at the lower lungs bilaterally, question multifocal pneumonia pulmonary edema considered less likely. No pleural effusion or pneumothorax. Osseous structures unremarkable IMPRESSION: Enlargement of cardiac silhouette with slight pulmonary vascular congestion. Diffuse BILATERAL pulmonary infiltrates greater at lower lungs bilaterally, favor multifocal pneumonia or less likely pulmonary edema. Electronically Signed   By: Lavonia Dana M.D.   On: 02/25/2020 10:49   DG Chest Port 1 View  Result Date: 02/23/2020 CLINICAL DATA:  Concern for sepsis EXAM: PORTABLE CHEST 1 VIEW COMPARISON:  10/09/2019 FINDINGS: Increased bibasilar airspace opacities worse on the left obscuring the left hemidiaphragm concerning for bibasilar pneumonia. Stable cardiomegaly. No CHF pattern or large effusion. No pneumothorax. Aorta atherosclerotic. IMPRESSION: Bibasilar airspace process, worse on the left concerning for pneumonia. Electronically Signed   By: Jerilynn Mages.  Shick M.D.   On: 02/23/2020 10:24   DG Foot 2 Views Left  Result Date: 02/23/2020 CLINICAL DATA:  Chronic wound EXAM: LEFT FOOT - 2 VIEW COMPARISON:  Sep 21, 2019 FINDINGS: Frontal and lateral views were obtained. There is soft tissue swelling. There is suggestion of soft tissue air medial to the distal aspect of the first metatarsal. There is no erosive change or osteomyelitis. No fracture or dislocation. Joint spaces overall appear unremarkable. There are prominent posterior and inferior calcaneal spurs. IMPRESSION: Soft tissue swelling with soft tissue air medial to the mid to distal first metatarsal. No bony destruction or erosion. No appreciable joint space narrowing. There are calcaneal spurs. No fracture or dislocation evident. Electronically Signed   By: Lowella Grip III M.D.   On: 02/23/2020 10:22   CT ANGIO CHEST AORTA W/CM &/OR WO/CM  Result Date: 02/24/2020 CLINICAL DATA:  Chest pain.   Peripheral vascular disease. EXAM: CT ANGIOGRAPHY OF ABDOMINAL AORTA WITH ILIOFEMORAL RUNOFF CT ANGIOGRAPHY OF ABDOMINAL AORTA WITH ILIOFEMORAL RUNOFF TECHNIQUE: Multidetector CT imaging of the abdomen, pelvis and lower extremities was performed using the standard protocol during bolus administration of intravenous contrast. Multiplanar CT image reconstructions and MIPs were obtained to evaluate the vascular anatomy. Multidetector CT imaging of the abdomen, pelvis and lower extremities was performed using the standard protocol during bolus administration of intravenous contrast. Multiplanar CT image  reconstructions and MIPs were obtained to evaluate the vascular anatomy. CONTRAST:  128m OMNIPAQUE IOHEXOL 350 MG/ML SOLN COMPARISON:  CT dated October 09, 2019. CT PE study dated 02/25/2019 FINDINGS: CHEST Cardiovascular: There is no evidence for large centrally located pulmonary embolism. Detection of smaller pulmonary emboli is limited by technique. There is no evidence for a thoracic aortic dissection or aneurysm. The heart size is enlarged. There is no significant pericardial effusion. There are mild coronary artery calcifications. Mediastinum/Nodes: --there are enlarged mediastinal lymph nodes. For example there is a pretracheal lymph node measuring approximately 1.8 x 2.9 cm (axial series 7, image 40). This is relatively similar to prior study. --mild hilar adenopathy is noted. -- No axillary lymphadenopathy. --there is significant right supraclavicular adenopathy which has progressed since the prior study. The dominant lymph node measures 1.2 by 3 point 4 cm (axial series 7, image 15). -- Normal thyroid gland where visualized. -  Unremarkable esophagus. Lungs/Pleura: There is a moderate to large right-sided pleural effusion. There is a small left-sided pleural effusion. There is bibasilar atelectasis. There is no pneumothorax. Musculoskeletal: No chest wall abnormality. No bony spinal canal stenosis. Review of the  MIP images confirms the above findings. VASCULAR Aorta: There are atherosclerotic changes of the abdominal aorta without evidence for an aneurysm. Celiac: Patent without evidence of aneurysm, dissection, vasculitis or significant stenosis. SMA: Patent without evidence of aneurysm, dissection, vasculitis or significant stenosis. Renals: Both renal arteries are patent without evidence of aneurysm, dissection, vasculitis, fibromuscular dysplasia or significant stenosis. IMA: Patent without evidence of aneurysm, dissection, vasculitis or significant stenosis. RIGHT Lower Extremity Inflow: Common, internal and external iliac arteries are patent without evidence of aneurysm, dissection, vasculitis or significant stenosis. Outflow: The right common femoral artery is patent. The right SFA is occluded proximally. There is near immediate reconstitution. There are tandem high-grade areas of stenosis throughout the right SFA. There is new complete occlusion of the mid to distal right SFA (axial series 7, image 247). Evaluation of the right popliteal artery is limited, however there appears to be high-grade stenosis throughout the vessel. Runoff: There is a high-grade stenosis of the proximal right anterior tibial artery. There appears to be a single vessel runoff to the right lower extremity via the anterior tibial artery. LEFT Lower Extremity Inflow: Common, internal and external iliac arteries are patent without evidence of aneurysm, dissection, vasculitis or significant stenosis. Outflow: The left CFA is widely patent. The proximal left SFA is entirely occluded. There is a new left femoral to below knee popliteal artery bypass graft. The graft is widely patent throughout its course. The profunda femoris artery is patent. Runoff: Heavy calcifications limit evaluation of the tibial vasculature. However there appears to be a 2 vessel runoff via the anterior tibial and posterior tibial arteries. The peroneal artery is occluded.  Veins: The veins are not well evaluated on this study. Review of the MIP images confirms the above findings. NON-VASCULAR Hepatobiliary: There is hepatomegaly with likely underlying hepatic steatosis. There appears to be gallbladder wall thickening with pericholecystic free fluid.There is no biliary ductal dilation. Pancreas: Normal contours without ductal dilatation. No peripancreatic fluid collection. Spleen: Unremarkable. Adrenals/Urinary Tract: --Adrenal glands: Unremarkable. --Right kidney/ureter: No hydronephrosis or radiopaque kidney stones. --Left kidney/ureter: No hydronephrosis or radiopaque kidney stones. --Urinary bladder: Unremarkable. Stomach/Bowel: --Stomach/Duodenum: No hiatal hernia or other gastric abnormality. Normal duodenal course and caliber. --Small bowel: Unremarkable. --Colon: Unremarkable. --Appendix: Normal. Lymphatic: --No retroperitoneal lymphadenopathy. --No mesenteric lymphadenopathy. --there is mild bilateral inguinal adenopathy, left worse than  right. Reproductive: Unremarkable Other: No ascites or free air. The abdominal wall is normal. Musculoskeletal. There are postsurgical changes of the left inguinal region. There is bilateral nonspecific lower extremity edema, left worse than right. There are small bilateral suprapatellar joint effusions. IMPRESSION: 1. Patent left lower extremity femoral to below knee popliteal artery bypass graft. Evaluation of the left tibial vasculature is limited by calcifications, however there appears to be a 2 vessel runoff to the left ankle via the anterior tibial and posterior tibial arteries. 2. Progressive peripheral vascular disease involving the right lower extremity. There is now complete occlusion of the mid to distal right superficial femoral artery, new since prior study. There are now appears to be a single vessel runoff to the right foot via the anterior tibial artery. In June, there was a 3 vessel runoff to the right foot. 3. Nonspecific,  asymmetric bilateral lower extremity edema (left worse than right). This is of unknown clinical significance. This study cannot adequately exclude a DVT. If there is clinical suspicion for a DVT, follow-up with ultrasound is recommended. 4. No evidence for an acute pulmonary embolism. No evidence for thoracic aortic dissection or aneurysm. 5. Cardiomegaly with small to moderate-sized bilateral pleural effusions, right worse than left. 6. Persistent mediastinal and hilar adenopathy with worsening right supraclavicular adenopathy. Findings may be reactive or due to a lymphoproliferative disorder or metastatic disease. Follow-up is recommended. Of note, the right supraclavicular lymph node is amenable to percutaneous ultrasound-guided biopsy if clinically indicated. 7. Hepatomegaly with likely underlying hepatic steatosis. 8.  Aortic Atherosclerosis (ICD10-I70.0). Electronically Signed   By: Constance Holster M.D.   On: 02/24/2020 18:13   VAS [place] ABI WITH/WO TBI  Result Date: 03/04/2020 LOWER EXTREMITY DOPPLER STUDY Indications: Rest pain. High Risk Factors: Hypertension, hyperlipidemia, Diabetes, current smoker.  Limitations: Today's exam was limited due to Patients inability to hold still. .              No toe pressures obtained. Performing Technologist: Griffin Basil RCT RDMS  Examination Guidelines: A complete evaluation includes at minimum, Doppler waveform signals and systolic blood pressure reading at the level of bilateral brachial, anterior tibial, and posterior tibial arteries, when vessel segments are accessible. Bilateral testing is considered an integral part of a complete examination. Photoelectric Plethysmograph (PPG) waveforms and toe systolic pressure readings are included as required and additional duplex testing as needed. Limited examinations for reoccurring indications may be performed as noted.  ABI Findings: +---------+------------------+-----+--------+--------+ Right    Rt Pressure  (mmHg)IndexWaveformComment  +---------+------------------+-----+--------+--------+ Brachial 145                                     +---------+------------------+-----+--------+--------+ PTA      72                0.50                  +---------+------------------+-----+--------+--------+ DP       80                0.55                  +---------+------------------+-----+--------+--------+ Great Toe                       Absent           +---------+------------------+-----+--------+--------+ +---------+------------------+-----+--------+----------------------------+ Left     Lt Pressure (mmHg)IndexWaveformComment                      +---------+------------------+-----+--------+----------------------------+  Brachial 145                                                         +---------+------------------+-----+--------+----------------------------+ PTA                             absent  No posterior tibial located. +---------+------------------+-----+--------+----------------------------+ DP       135               0.93                                      +---------+------------------+-----+--------+----------------------------+ Great Toe                       Absent                               +---------+------------------+-----+--------+----------------------------+ TOES Findings: +----------+---------------+--------+-------+ Right ToesPressure (mmHg)WaveformComment +----------+---------------+--------+-------+ 1st Digit                Absent          +----------+---------------+--------+-------+  +---------+---------------+--------+-------+ Left ToesPressure (mmHg)WaveformComment +---------+---------------+--------+-------+ 1st Digit               Absent          +---------+---------------+--------+-------+   Pateints Blood pressure is obtained in leg. Both upper extremities comprimised.  Summary: Right: Resting right  ankle-brachial index indicates moderate right lower extremity arterial disease. Left: Resting left ankle-brachial index indicates mild left lower extremity arterial disease.  *See table(s) above for measurements and observations.  Electronically signed by Jamelle Haring on 03/04/2020 at 7:16:47 PM.    Final    ECHOCARDIOGRAM COMPLETE  Result Date: 03/01/2020    ECHOCARDIOGRAM REPORT   Patient Name:   Morgan Beasley [facility] Date of Exam: 03/01/2020 Medical Rec #:  [id number]            Height:       66.0 in Accession #:    [id number]           Weight:       199.0 lb Date of Birth:  08/02/1960             BSA:          1.996 m Patient Age:    20 years             BP:           144/84 mmHg Patient Gender: F                    HR:           89 bpm. Exam Location:  Inpatient Procedure: 2D Echo, Cardiac Doppler and Color Doppler Indications:    Stroke 434.91 / I163.9  History:        Patient has prior history of Echocardiogram examinations, most                 recent 10/13/2019. CHF, COPD; Risk Factors:Hypertension,                 Dyslipidemia and Diabetes.  Sonographer:    Judson Roch  Brantley Stage RDCS Referring Phys: [id number] DENISE A WOLFE  Sonographer Comments: Patient refused half way through exam IMPRESSIONS  1. Unable to fully assess EF and wall motion as patient terminated the exam early and we needed definity contrast to delineate the LV endocardium. Based on limited visualization, the patient appears to have mildly-to-moderately reduced LV systolic function with LVEF ~40%. On short axis views, the anteior and septal walls appear moderately hypokinetic. Again, definity contrast would help delineate this further as visualization is very limited.  2. Left ventricular diastolic parameters are consistent with Grade II diastolic dysfunction (pseudonormalization). Elevated left atrial pressure.  3. Right ventricular systolic function is at least mildly reduced. Patient terminated exam prior to full evaluation of RV systolic function.  The right ventricular size is mildly enlarged.  4. The mitral valve is normal in structure. Trivial mitral valve regurgitation.  5. The aortic valve is tricuspid. There is mild thickening of the aortic valve. Aortic valve regurgitation is trivial. Comparison(s): Compared to prior TTE in 09/22/19, this exam is much more limited, however, LVEF appears to have decreased to ~40%. Conclusion(s)/Recommendation(s): No intracardiac source of embolism detected on this transthoracic study. A transesophageal echocardiogram is recommended to exclude cardiac source of embolism if clinically indicated. FINDINGS  Left Ventricle: Unable to fully assess EF and wall motion as patient terminated the exam early and we needed definity contrast to delineate the LV endocardium. Based on limited visualization, the patient appears to have mildly-to-moderately reduced LV systolic function with LVEF ~40%. On short axis views, the anteior and septal walls appear moderately hypokinetic. The left ventricular internal cavity size was normal in size. There is borderline concentric left ventricular hypertrophy. Left ventricular  diastolic parameters are consistent with Grade II diastolic dysfunction (pseudonormalization). Elevated left atrial pressure. The E/e' is 21.5. Again, definity contrast would help delineate this further as visualization is very limited. Right Ventricle: The right ventricular size is mildly enlarged. Right vetricular wall thickness was not well visualized. Right ventricular systolic function is at least mildly reduced. Test terminated by the patient prior to full evaluation of RV systolic function. Left Atrium: Left atrial size was normal in size. Right Atrium: Right atrial size was normal in size. Pericardium: There is no evidence of pericardial effusion. Mitral Valve: The mitral valve is normal in structure. There is mild thickening of the mitral valve leaflet(s). There is mild calcification of the mitral valve leaflet(s).  Trivial mitral valve regurgitation. Tricuspid Valve: The tricuspid valve is normal in structure. Tricuspid valve regurgitation is trivial. Aortic Valve: The aortic valve is tricuspid. There is mild thickening of the aortic valve. Aortic valve regurgitation is trivial. Pulmonic Valve: The pulmonic valve was normal in structure. Pulmonic valve regurgitation is trivial. Aorta: The aortic root and ascending aorta are structurally normal, with no evidence of dilitation. IAS/Shunts: The atrial septum is grossly normal.  LEFT VENTRICLE PLAX 2D LVIDd:         5.10 cm  Diastology LVIDs:         3.70 cm  LV e' medial:    4.70 cm/s LV PW:         0.90 cm  LV E/e' medial:  21.5 LV IVS:        0.80 cm  LV e' lateral:   5.59 cm/s LVOT diam:     2.00 cm  LV E/e' lateral: 18.1 LV SV:         41 LV SV Index:   20 LVOT Area:     3.14  cm  LEFT ATRIUM           Index       RIGHT ATRIUM           Index LA diam:      4.10 cm 2.05 cm/m  RA Area:     18.20 cm LA Vol (A4C): 65.7 ml 32.92 ml/m RA Volume:   54.80 ml  27.46 ml/m  AORTIC VALVE LVOT Vmax:   77.30 cm/s LVOT Vmean:  51.400 cm/s LVOT VTI:    0.129 m  AORTA Ao Root diam: 2.70 cm Ao Asc diam:  2.90 cm MITRAL VALVE                TRICUSPID VALVE MV Area (PHT): 5.38 cm     TR Peak grad:   17.5 mmHg MV Decel Time: 141 msec     TR Vmax:        209.00 cm/s MV E velocity: 101.00 cm/s MV A velocity: 71.80 cm/s   SHUNTS MV E/A ratio:  1.41         Systemic VTI:  0.13 m                             Systemic Diam: 2.00 cm Gwyndolyn Kaufman MD Electronically signed by Gwyndolyn Kaufman MD Signature Date/Time: 03/01/2020/11:55:54 AM    Final    ECHO TEE  Result Date: 03/04/2020    TRANSESOPHOGEAL ECHO REPORT   Patient Name:   Morgan Beasley [facility] Date of Exam: 03/04/2020 Medical Rec #:  [id number]            Height:       66.0 in Accession #:    [id number]           Weight:       190.0 lb Date of Birth:  1960/07/16             BSA:          1.957 m Patient Age:    65 years             BP:            73/54 mmHg Patient Gender: F                    HR:           117 bpm. Exam Location:  Inpatient Procedure: Transesophageal Echo, Cardiac Doppler, Color Doppler and Saline            Contrast Bubble Study Indications:     Stroke 434.91 / I163.9  History:         Patient has prior history of Echocardiogram examinations, most                  recent 03/01/2020. CHF, COPD; Risk Factors:Hypertension,                  Diabetes and Dyslipidemia.  Sonographer:     Jonelle Sidle Dance Referring Phys:  [id number] Darreld Mclean Diagnosing Phys: Cherlynn Kaiser MD PROCEDURE: After discussion of the risks and benefits of a TEE, an informed consent was obtained from the patient. TEE procedure time was 25 minutes. The transesophogeal probe was passed without difficulty through the esophogus of the patient. Imaged were obtained with the patient in a supine position. Local oropharyngeal anesthetic was provided with Cetacaine. Sedation performed by different physician. The patient was monitored while under deep sedation. Anesthestetic  sedation was provided intravenously by Anesthesiology: 367.63m of Propofol. Image quality was good. The patient's vital signs; including heart rate, blood pressure, and oxygen saturation; remained stable throughout the procedure. The patient developed Atrial Fibrillation during the procedure. Imaging probe error with gain control is noted on images throughout study. IMPRESSIONS  1. There is a mobile heterogeneous echodensity attached to the transvenous catheter, seen in the mid and distal SVC, suggestive of catheter related vegetation or thrombus.  2. Left ventricular ejection fraction, by estimation, is 40%. The left ventricle has mildly decreased function.  3. Right ventricular systolic function is moderately reduced. The right ventricular size is moderately enlarged.  4. Left atrial size was mildly dilated. No left atrial/left atrial appendage thrombus was detected. The LAA emptying velocity was  57 cm/s.  5. Right atrial size was moderately dilated.  6. The mitral valve is grossly normal. Trivial mitral valve regurgitation.  7. The aortic valve is tricuspid. Aortic valve regurgitation is trivial. No aortic stenosis is present.  8. Probable small pulmonary valve vegetation. Mobile echodensity noted on the PA aspect of the pulmonary valve.. The pulmonic valve was abnormal.  9. Agitated saline contrast bubble study was negative, with no evidence of any interatrial shunt. FINDINGS  Left Ventricle: Left ventricular ejection fraction, by estimation, is 40%. The left ventricle has mildly decreased function. The left ventricular internal cavity size was normal in size. Right Ventricle: The right ventricular size is moderately enlarged. No increase in right ventricular wall thickness. Right ventricular systolic function is moderately reduced. Left Atrium: Left atrial size was mildly dilated. No left atrial/left atrial appendage thrombus was detected. The LAA emptying velocity was 57 cm/s. Right Atrium: Right atrial size was moderately dilated. Pericardium: There is no evidence of pericardial effusion. Mitral Valve: The mitral valve is grossly normal. Trivial mitral valve regurgitation. Tricuspid Valve: The tricuspid valve is normal in structure. Tricuspid valve regurgitation is trivial. Aortic Valve: The aortic valve is tricuspid. Aortic valve regurgitation is trivial. No aortic stenosis is present. Pulmonic Valve: Probable small pulmonary valve vegetation. Mobile echodensity noted on the PA aspect of the pulmonary valve. The pulmonic valve was abnormal. Pulmonic valve regurgitation is trivial. Aorta: The aortic root and ascending aorta are structurally normal, with no evidence of dilitation. IAS/Shunts: There is left bowing of the interatrial septum, suggestive of elevated right atrial pressure. No atrial level shunt detected by color flow Doppler. Agitated saline contrast was given intravenously to evaluate for  intracardiac shunting. Agitated saline contrast bubble study was negative, with no evidence of any interatrial shunt. Additional Comments: There is a small pleural effusion. GCherlynn KaiserMD Electronically signed by GCherlynn KaiserMD Signature Date/Time: 03/04/2020/11:29:17 PM    Final    [place] DOPPLER  Result Date: 03/01/2020 CLINICAL DATA:  Elevated LFTs.  Evaluate hepatic vasculature. EXAM: DUPLEX ULTRASOUND OF LIVER TECHNIQUE: Color and duplex Doppler ultrasound was performed to evaluate the hepatic in-flow and out-flow vessels. COMPARISON:  Right upper quadrant abdominal ultrasound-02/28/2020 FINDINGS: Liver: While there is preserved hepatic echogenicity, there is suspected mild nodularity hepatic contour. No discrete hepatic lesions. No intrahepatic biliary duct dilatation. Main Portal Vein size: 1.1 cm Portal Vein Velocities (normal hepatopetal directional flow) Main Prox:  21 cm/sec Main Mid: 21 cm/sec Main Dist:  23 cm/sec Right: 22 cm/sec Left: 13 cm/sec Hepatic Vein Velocities (normal hepatofugal directional flow) Right:  7 cm/sec Middle:  28 cm/sec Left:  20 cm/sec IVC: Present and patent with normal respiratory phasicity. Hepatic Artery Velocity:  58 cm/sec  Splenic Vein Velocity:  31 cm/sec Spleen: 8.6 cm x 3.2 cm x 1.6 cm with a total volume of 23 cm^3 (411 cm^3 is upper limit normal) Portal Vein Occlusion/Thrombus: No Splenic Vein Occlusion/Thrombus: No Ascites: None Varices: None IMPRESSION: 1. Patent hepatic vasculature with normal directional flow. 2. Suspected mild nodularity hepatic contour as could be seen in the setting of early cirrhotic change without stigmata of portal venous hypertension, specifically, no evidence of splenomegaly or intra-abdominal ascites. No discrete hepatic lesions. Electronically Signed   By: Sandi Mariscal M.D.   On: 03/01/2020 08:45   VAS [place] LOWER EXTREMITY VENOUS (DVT)  Result Date: 02/25/2020  Lower Venous DVTStudy Indications: Edema.  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 Limitations: Body habitus, poor ultrasound/tissue interface and patient intolerant to probe pressure. Comparison Study: No prior studies. Performing Technologist: Darlin Coco  Examination Guidelines: A complete evaluation includes B-mode imaging, spectral Doppler, color Doppler, and power Doppler as needed of all accessible portions of each vessel. Bilateral testing is considered an integral part of a complete examination. Limited examinations for reoccurring indications may be performed as noted. The reflux portion of the exam is performed with the patient in reverse Trendelenburg.  +-----+---------------+---------+-----------+----------+--------------+ RIGHTCompressibilityPhasicitySpontaneityPropertiesThrombus Aging +-----+---------------+---------+-----------+----------+--------------+ CFV  Full           Yes      Yes                                 +-----+---------------+---------+-----------+----------+--------------+   +---------+---------------+---------+-----------+----------+---------------+ LEFT     CompressibilityPhasicitySpontaneityPropertiesThrombus Aging  +---------+---------------+---------+-----------+----------+---------------+ CFV      Full           Yes      Yes                                  +---------+---------------+---------+-----------+----------+---------------+ SFJ      Full                                                         +---------+---------------+---------+-----------+----------+---------------+ FV Prox  Full                                                         +---------+---------------+---------+-----------+----------+---------------+ FV Mid                  Yes      Yes                  Patent by color +---------+---------------+---------+-----------+----------+---------------+ FV Distal                Yes      Yes                  Patent by color +---------+---------------+---------+-----------+----------+---------------+ PFV      Full                                                         +---------+---------------+---------+-----------+----------+---------------+  POP      Full           Yes      Yes                                  +---------+---------------+---------+-----------+----------+---------------+ PTV      Full                                                         +---------+---------------+---------+-----------+----------+---------------+ PERO                    Yes      Yes                  Patent by color +---------+---------------+---------+-----------+----------+---------------+     Summary: RIGHT: - No evidence of common femoral vein obstruction.  LEFT: - There is no evidence of deep vein thrombosis in the lower extremity. However, portions of this examination were limited- see technologist comments above.  - No cystic structure found in the popliteal fossa.  *See table(s) above for measurements and observations. Electronically signed by Harold Barban MD on 02/25/2020 at 8:54:52 PM.    Final    VAS [place] UPPER EXTREMITY VENOUS DUPLEX  Result Date: 03/13/2020 UPPER VENOUS STUDY  Indications: Pain, and Swelling Risk Factors: Obesity. Performing Technologist: Griffin Basil RCT RDMS  Examination Guidelines: A complete evaluation includes B-mode imaging, spectral Doppler, color Doppler, and power Doppler as needed of all accessible portions of each vessel. Bilateral testing is considered an integral part of a complete examination. Limited examinations for reoccurring indications may be performed as noted.  Left Findings: +----------+------------+---------+-----------+----------+--------------------+ LEFT      CompressiblePhasicitySpontaneousProperties      Summary        +----------+------------+---------+-----------+----------+--------------------+ IJV            Full       Yes       Yes                                   +----------+------------+---------+-----------+----------+--------------------+ Subclavian    Full       Yes       Yes                                   +----------+------------+---------+-----------+----------+--------------------+ Axillary      Full       Yes       Yes                                   +----------+------------+---------+-----------+----------+--------------------+ Brachial      Full       Yes       Yes                                   +----------+------------+---------+-----------+----------+--------------------+ Radial        Full                                                       +----------+------------+---------+-----------+----------+--------------------+  Ulnar         Full                                                       +----------+------------+---------+-----------+----------+--------------------+ Cephalic      None                                     Mid upper Arm                                                               Thrombus       +----------+------------+---------+-----------+----------+--------------------+ Basilic       Full                                                       +----------+------------+---------+-----------+----------+--------------------+  Summary:  Right: No evidence of thrombosis in the subclavian.  Left: No evidence of deep vein thrombosis in the upper extremity. Findings consistent with acute superficial vein thrombosis involving the left cephalic vein.  *See table(s) above for measurements and observations.  Diagnosing physician: Ruta Hinds MD Electronically signed by Ruta Hinds MD on 03/13/2020 at 11:49:05 AM.    Final    [place] EKG SITE RITE  Result Date: 02/25/2020 If Site Rite image not attached, placement could not be confirmed due to current cardiac rhythm.  US Abdomen Limited RUQ  (LIVER/GB)  Result Date: 02/28/2020 CLINICAL DATA:  Transaminitis, EXAM: ULTRASOUND ABDOMEN LIMITED RIGHT UPPER QUADRANT COMPARISON:  None. FINDINGS: Gallbladder: The gallbladder is mildly distended and there is pericholecystic fluid identified within the a gallbladder fossa. A small amount of layering sludge is seen within the gallbladder neck. There is no associated gallbladder wall thickening. The sonographic Percell Miller sign is reportedly positive. Common bile duct: Diameter: 3 mm Liver: Liver parenchymal echogenicity and echotexture is normal. No focal intrahepatic masses are seen and there is no intrahepatic biliary ductal dilation. Portal vein is patent on color Doppler imaging with normal direction of blood flow towards the liver. Other: No ascites IMPRESSION: Mild distention of the gallbladder, gallbladder sludge, and pericholecystic fluid identified. Reportedly positive sonographic Murphy sign. Together, the findings are suspicious for changes of acute, calculus cholecystitis. Electronically Signed   By: Fidela [place] MD   On: 02/28/2020 01:23      Procedures: left upper extremity PICC line   Subjective: Patient is feeling better, no chest pain or dyspnea, no nausea or vomiting. She is very anxious in going home today.   Discharge Exam: Vitals:   03/17/20 2123 03/18/20 0615  BP: 106/66   Pulse: 78 75  Resp: 18 20  Temp: 98.6 F (37 C) 98.5 F (36.9 C)  SpO2: 95% 94%   Vitals:   03/17/20 0540 03/17/20 1420 03/17/20 2123 03/18/20 0615  BP: 92/69 (!) 82/54 106/66   Pulse: 63 93 78 75  Resp: _0 Temp:  98.3 F (36.8 C) 98.4 F (36.9 C) 98.6 F (37 C) 98.5 F (36.9 C)  TempSrc: Oral Oral Oral Oral  SpO2: 93% 97% 95% 94%  Weight:      Height:        General: Not in pain or dyspnea.  Neurology: Awake and alert, non focal  E ENT: no pallor, no icterus, oral mucosa moist Cardiovascular: No JVD. S1-S2 present, rhythmic, no gallops, rubs, or murmurs. Trace non pitting  lower extremity edema. Pulmonary: positive breath sounds bilaterally, adequate air movement, no wheezing, rhonchi or rales. Gastrointestinal. Abdomen soft and non tender Skin. Left foot dorsal ulcer with dressing in place.  Musculoskeletal: no joint deformities   The results of significant diagnostics from this hospitalization (including imaging, microbiology, ancillary and laboratory) are listed below for reference.     Microbiology: Recent Results (from the past 240 hour(s))  Respiratory Panel by RT PCR (Flu A&B, Covid) - Nasopharyngeal Swab     Status: None   Collection Time: 03/09/20  9:10 AM   Specimen: Nasopharyngeal Swab  Result Value Ref Range Status   SARS Coronavirus 2 by RT PCR NEGATIVE NEGATIVE Final    Comment: (NOTE) SARS-CoV-2 target nucleic acids are NOT DETECTED.  The SARS-CoV-2 RNA is generally detectable in upper respiratoy specimens during the acute phase of infection. The lowest concentration of SARS-CoV-2 viral copies this assay can detect is 131 copies/mL. A negative result does not preclude SARS-Cov-2 infection and should not be used as the sole basis for treatment or other patient management decisions. A negative result may occur with  improper specimen collection/handling, submission of specimen other than nasopharyngeal swab, presence of viral mutation(s) within the areas targeted by this assay, and inadequate number of viral copies (<131 copies/mL). A negative result must be combined with clinical observations, patient history, and epidemiological information. The expected result is Negative.  Fact Sheet for Patients:  PinkCheek.be  Fact Sheet for Healthcare Providers:  GravelBags.it  This test is no t yet approved or cleared by the [place] FDA and  has been authorized for detection and/or diagnosis of SARS-CoV-2 by FDA under an Emergency Use Authorization (EUA). This EUA will remain   in effect (meaning this test can be used) for the duration of the COVID-19 declaration under Section 564(b)(1) of the Act, 21 U.S.C. section 360bbb-3(b)(1), unless the authorization is terminated or revoked sooner.     Influenza A by PCR NEGATIVE NEGATIVE Final   Influenza B by PCR NEGATIVE NEGATIVE Final    Comment: (NOTE) The Xpert Xpress SARS-CoV-2/FLU/RSV assay is intended as an aid in  the diagnosis of influenza from Nasopharyngeal swab specimens and  should not be used as a sole basis for treatment. Nasal washings and  aspirates are unacceptable for Xpert Xpress SARS-CoV-2/FLU/RSV  testing.  Fact Sheet for Patients: PinkCheek.be  Fact Sheet for Healthcare Providers: GravelBags.it  This test is not yet approved or cleared by the [place] FDA and  has been authorized for detection and/or diagnosis of SARS-CoV-2 by  FDA under an Emergency Use Authorization (EUA). This EUA will remain  in effect (meaning this test can be used) for the duration of the  Covid-19 declaration under Section 564(b)(1) of the Act, 21  U.S.C. section 360bbb-3(b)(1), unless the authorization is  terminated or revoked. Performed at [facility], [place] [address] [place], [place] [address]      Labs: BNP (last 3 results) Recent Labs    03/02/20 0431 03/03/20 0500 03/04/20 0537  BNP 1,144.9* 2,599.6* 7,169.6*   Basic Metabolic Panel: Recent Labs  Lab 03/12/20 0659 03/13/20 0622 03/16/20 0315 03/18/20 0500  NA 138 139 142 140  K 4.3 4.1 3.6 3.9  CL 102 102 101 100  CO2 _0 32  GLUCOSE 252* 126* 107* 195*  BUN 21* 23* 22* 24*  CREATININE 1.38* 1.29* 1.28* 1.32*  CALCIUM 8.6* 8.8* 9.1 8.6*  MG 1.9  --   --   --    Liver Function Tests: Recent Labs  Lab 03/12/20 0659 03/13/20 0622  AST 31 24  ALT 49* 41  ALKPHOS 220* 212*  BILITOT 0.5 0.5  PROT 6.6 6.6  ALBUMIN 2.0* 1.9*   No results for input(s):  LIPASE, AMYLASE in the last 168 hours. No results for input(s): AMMONIA in the last 168 hours. CBC: Recent Labs  Lab 03/12/20 0659 03/13/20 0622 03/14/20 0540 03/15/20 0317 03/16/20 0315  WBC 7.5 6.0 8.6 7.5 8.1  NEUTROABS 4.1 3.1 5.7 5.0 5.0  HGB 9.6* 9.9* 9.6* 9.5* 9.9*  HCT 32.3* 33.1* 31.6* 31.2* 33.4*  MCV 81.0 82.8 80.0 80.8 81.9  PLT 344 343 326 309 328   Cardiac Enzymes: No results for input(s): CKTOTAL, CKMB, CKMBINDEX, TROPONINI in the last 168 hours. BNP: Invalid input(s): POCBNP CBG: Recent Labs  Lab 03/17/20 0812 03/17/20 1135 03/17/20 1643 03/17/20 2117 03/18/20 0802  GLUCAP 148* 216* 109* 247* 136*   D-Dimer No results for input(s): DDIMER in the last 72 hours. Hgb A1c No results for input(s): HGBA1C in the last 72 hours. Lipid Profile No results for input(s): CHOL, HDL, LDLCALC, TRIG, CHOLHDL, LDLDIRECT in the last 72 hours. Thyroid function studies No results for input(s): TSH, T4TOTAL, T3FREE, THYROIDAB in the last 72 hours.  Invalid input(s): FREET3 Anemia work up No results for input(s): VITAMINB12, FOLATE, FERRITIN, TIBC, IRON, RETICCTPCT in the last 72 hours. Urinalysis    Component Value Date/Time   COLORURINE AMBER (A) 02/26/2020 1722   APPEARANCEUR HAZY (A) 02/26/2020 1722   LABSPEC 1.034 (H) 02/26/2020 1722   PHURINE 5.0 02/26/2020 1722   GLUCOSEU 50 (A) 02/26/2020 1722   HGBUR SMALL (A) 02/26/2020 1722   BILIRUBINUR SMALL (A) 02/26/2020 1722   KETONESUR 5 (A) 02/26/2020 1722   PROTEINUR >=300 (A) 02/26/2020 1722   NITRITE NEGATIVE 02/26/2020 1722   LEUKOCYTESUR TRACE (A) 02/26/2020 1722   Sepsis Labs Invalid input(s): PROCALCITONIN,  WBC,  LACTICIDVEN Microbiology Recent Results (from the past 240 hour(s))  Respiratory Panel by RT PCR (Flu A&B, Covid) - Nasopharyngeal Swab     Status: None   Collection Time: 03/09/20  9:10 AM   Specimen: Nasopharyngeal Swab  Result Value Ref Range Status   SARS Coronavirus 2 by RT PCR  NEGATIVE NEGATIVE Final    Comment: (NOTE) SARS-CoV-2 target nucleic acids are NOT DETECTED.  The SARS-CoV-2 RNA is generally detectable in upper respiratoy specimens during the acute phase of infection. The lowest concentration of SARS-CoV-2 viral copies this assay can detect is 131 copies/mL. A negative result does not preclude SARS-Cov-2 infection and should not be used as the sole basis for treatment or other patient management decisions. A negative result may occur with  improper specimen collection/handling, submission of specimen other than nasopharyngeal swab, presence of viral mutation(s) within the areas targeted by this assay, and inadequate number of viral copies (<131 copies/mL). A negative result must be combined with clinical observations, patient history, and epidemiological information. The expected result is Negative.  Fact Sheet for Patients:  PinkCheek.be  Fact Sheet for Healthcare Providers:  GravelBags.it  This test is no t yet approved or cleared by the [place] FDA and  has been authorized for detection and/or diagnosis of SARS-CoV-2 by FDA under an Emergency Use Authorization (EUA). This EUA will remain  in effect (meaning this test can be used) for the duration of the COVID-19 declaration under Section 564(b)(1) of the Act, 21 U.S.C. section 360bbb-3(b)(1), unless the authorization is terminated or revoked sooner.     Influenza A by PCR NEGATIVE NEGATIVE Final   Influenza B by PCR NEGATIVE NEGATIVE Final    Comment: (NOTE) The Xpert Xpress SARS-CoV-2/FLU/RSV assay is intended as an aid in  the diagnosis of influenza from Nasopharyngeal swab specimens and  should not be used as a sole basis for treatment. Nasal washings and  aspirates are unacceptable for Xpert Xpress SARS-CoV-2/FLU/RSV  testing.  Fact Sheet for Patients: PinkCheek.be  Fact Sheet for  Healthcare Providers: GravelBags.it  This test is not yet approved or cleared by the [place] FDA and  has been authorized for detection and/or diagnosis of SARS-CoV-2 by  FDA under an Emergency Use Authorization (EUA). This EUA will remain  in effect (meaning this test can be used) for the duration of the  Covid-19 declaration under Section 564(b)(1) of the Act, 21  U.S.C. section 360bbb-3(b)(1), unless the authorization is  terminated or revoked. Performed at [facility], [place] [address] [place], [place] [address]      Time coordinating discharge: 45 minutes  SIGNED:   Tawni Millers, MD  [facility] 03/18/2020, 11:54 AM

## 2020-03-18 NOTE — Progress Notes (Signed)
Occupational Therapy Treatment Patient Details Name: Morgan Beasley MRN: [id number] DOB: August 20, 1960 Today's Date: 03/18/2020    History of present illness Pt is a 59 y.o. female admitted 02/23/20 with LLE pain; lab work consistent with sepsis; also with LLE cellulitis. On 10/31, pt with acute hallucinations and AMS found to have R occipital CVA with parietotemporal extension and chronic L occipital CVA. TEE 11/12 showed pulmonary valve vegetation. Ultrasound shows LUE brachial vein thrombus. Hospital course complicated by acute encephalopathy. PMH includes CHF, CKD3, COPD, DM, neuropathy, HTN, CAD, OSA, PAD, tobacco use, alcohol dependence, anxiety, gout, breast CA, R foot ulcer, LLE vascular intervention in 2021 (most recently 01/26/20).   OT comments  Pt seen for OT follow up session with focus on cognition to engage in simple ADL tasks. Pt on EOB at start of session, agitated and threatening to swing at staff. After some time and redirection, pt wanting to walk. She required min guard and max safety cues (attempting to furniture walk and not use rollator). Once to door, pt wanting to "lay down right here on the floor". Safety sitter stepped in to guard pt as OT placed rollator seat under hips so pt would sit and not hit floor. Continues to fluctuate being fixated on her family leaving her here. Requires constant redirection. D/c recs remain appropriate. If family chooses to take pt home she will require 24/7 physical assist for safety. Will continue to follow.   Follow Up Recommendations  SNF;Supervision/Assistance - 24 hour    Equipment Recommendations  Wheelchair (measurements OT);Wheelchair cushion (measurements OT);3 in 1 bedside commode    Recommendations for Other Services      Precautions / Restrictions Precautions Precautions: Fall;Other (comment) Precaution Comments: L foot wound, LUE DVT       Mobility Bed Mobility               General bed mobility comments:  sitting EOB on arrival  Transfers Overall transfer level: Needs assistance Equipment used: 4-wheeled walker Transfers: Sit to/from Stand Sit to Stand: Min guard         General transfer comment: close guarding for safety, pt denying help    Balance Overall balance assessment: Needs assistance Sitting-balance support: Feet supported Sitting balance-Leahy Scale: Fair     Standing balance support: Single extremity supported;Bilateral upper extremity supported;During functional activity Standing balance-Leahy Scale: Poor Standing balance comment: reliant on BUE support from rollator during standing                           ADL either performed or assessed with clinical judgement   ADL Overall ADL's : Needs assistance/impaired                     Lower Body Dressing: Minimal assistance;Sit to/from stand Lower Body Dressing Details (indicate cue type and reason): pt standing and insisting she step into darco shoe, required min A for safety Toilet Transfer: Minimal assistance;Ambulation;Regular Toilet           Functional mobility during ADLs: Minimal assistance;Rolling walker;Cueing for safety General ADL Comments: On arrival, pt agitated but wanting to "walk out to the store". Session focused on cognition to get pt attend to functional tasks     Vision Patient Visual Report: No change from baseline     Perception     Praxis      Cognition Arousal/Alertness: Awake/alert Behavior During Therapy: Restless;Agitated (labile) Overall Cognitive Status: Impaired/Different from baseline Area of  Impairment: Orientation;Attention;Following commands;Safety/judgement;Awareness;Problem solving                 Orientation Level: Disoriented to;Time;Situation Current Attention Level: Focused   Following Commands: Follows one step commands inconsistently;Follows one step commands with increased time Safety/Judgement: Decreased awareness of  safety;Decreased awareness of deficits Awareness: Intellectual Problem Solving: Slow processing;Requires verbal cues;Requires tactile cues General Comments: pt with lability throughout session. Was originally agitated, threatening to hit staff. Once her attention was fixated on completing task of walking to hallway, pt was able to complete and become more calm. She continues to cycle back and perseverate on her family "leaving her stuck here"        Exercises     Shoulder Instructions       General Comments      Pertinent Vitals/ Pain       Pain Assessment: No/denies pain  Home Living                                          Prior Functioning/Environment              Frequency  Min 2X/week        Progress Toward Goals  OT Goals(current goals can now be found in the care plan section)  Progress towards OT goals: Progressing toward goals  Acute Rehab OT Goals Patient Stated Goal: I want to walk OT Goal Formulation: With patient Time For Goal Achievement: 03/24/20 Potential to Achieve Goals: [place] Discharge plan remains appropriate;Frequency remains appropriate    Co-evaluation                 AM-PAC OT "6 Clicks" Daily Activity     Outcome Measure   Help from another person eating meals?: A Little Help from another person taking care of personal grooming?: A Little Help from another person toileting, which includes using toliet, bedpan, or urinal?: A Lot Help from another person bathing (including washing, rinsing, drying)?: A Lot Help from another person to put on and taking off regular upper body clothing?: A Little Help from another person to put on and taking off regular lower body clothing?: A Little 6 Click Score: 16    End of Session Equipment Utilized During Treatment: Gait belt;Rolling walker  OT Visit Diagnosis: Other abnormalities of gait and mobility (R26.89);Muscle weakness (generalized) (M62.81);Other symptoms  and signs involving cognitive function   Activity Tolerance Treatment limited secondary to agitation   Patient Left in bed;with call bell/phone within reach;with nursing/sitter in room;Other (comment);with bed alarm set (sitting EOB)   Nurse Communication Mobility status        Time: 4540-9811 OT Time Calculation (min): 16 min  Charges: OT General Charges $OT Visit: 1 Visit OT Treatments $Self Care/Home Management : 8-22 mins  Zenovia Jarred, MSOT, OTR/L [facility] Office Number: [phone] Pager: [phone]  Zenovia Jarred 03/18/2020, 11:52 AM

## 2020-03-18 NOTE — Progress Notes (Signed)
Treatment to left foot wound was done this shift.

## 2020-03-18 NOTE — Progress Notes (Signed)
Occupational Therapy Treatment Patient Details Name: Morgan Beasley MRN: [id number] DOB: [birth year] Today's Date: 03/18/2020    History of present illness Pt is a 59 y.o. female admitted 02/23/20 with LLE pain; lab work consistent with sepsis; also with LLE cellulitis. On 10/31, pt with acute hallucinations and AMS found to have R occipital CVA with parietotemporal extension and chronic L occipital CVA. TEE 11/12 showed pulmonary valve vegetation. Ultrasound shows LUE brachial vein thrombus. Hospital course complicated by acute encephalopathy. PMH includes CHF, CKD3, COPD, DM, neuropathy, HTN, CAD, OSA, PAD, tobacco use, alcohol dependence, anxiety, gout, breast CA, R foot ulcer, LLE vascular intervention in 2021 (most recently 01/26/20).   OT comments  Pt seen for additional OT session at request of NT sitter 2/2 restlessness and agitation. Pt was trying to walk in hallway, requiring intermittent min A for safety. Pt required min A to don darco shoe this date. She endorsed DOE/SOB this session, but refused to wear O2. Pt very fixated on rewards. Was able to complete functional tasks asked by OT in return for crackers. OT also to provide activity coloring book for pt to provide functional diversion/reduce agitation. D/c recs remain appropriate.    Follow Up Recommendations  SNF;Supervision/Assistance - 24 hour    Equipment Recommendations  Wheelchair (measurements OT);Wheelchair cushion (measurements OT);3 in 1 bedside commode    Recommendations for Other Services      Precautions / Restrictions Precautions Precautions: Fall;Other (comment) Precaution Comments: L foot wound, LUE DVT       Mobility Bed Mobility               General bed mobility comments: sitting EOB on arrival  Transfers Overall transfer level: Needs assistance Equipment used: 4-wheeled walker Transfers: Sit to/from Stand Sit to Stand: Min guard         General transfer comment: close guarding for  safety, pt denying help    Balance Overall balance assessment: Needs assistance Sitting-balance support: Feet supported Sitting balance-Leahy Scale: Fair     Standing balance support: Single extremity supported;Bilateral upper extremity supported;During functional activity Standing balance-Leahy Scale: Poor Standing balance comment: reliant on BUE support from rollator during standing                           ADL either performed or assessed with clinical judgement   ADL Overall ADL's : Needs assistance/impaired                     Lower Body Dressing: Minimal assistance;Sit to/from stand Lower Body Dressing Details (indicate cue type and reason): pt standing and insisting she step into darco shoe, required min A for safety Toilet Transfer: Minimal assistance;Ambulation;Regular Glass blower/designer Details (indicate cue type and reason): min A for steadying support, pt attempting to sit to early. Simulated with recliner         Functional mobility during ADLs: Minimal assistance;Rolling walker;Cueing for safety General ADL Comments: seen for additional session at request of NT sitter due to safety concerns and to see if OT had activities to give pt for functional distraction/redirection. OT will administer activity book     Vision Patient Visual Report: No change from baseline     Perception     Praxis      Cognition Arousal/Alertness: Awake/alert Behavior During Therapy: Restless;Agitated (labile) Overall Cognitive Status: Impaired/Different from baseline Area of Impairment: Orientation;Attention;Following commands;Safety/judgement;Awareness;Problem solving  Orientation Level: Disoriented to;Time;Situation Current Attention Level: Focused   Following Commands: Follows one step commands inconsistently;Follows one step commands with increased time Safety/Judgement: Decreased awareness of safety;Decreased awareness of  deficits Awareness: Intellectual Problem Solving: Slow processing;Requires verbal cues;Requires tactile cues General Comments: remains labile. Focused on food and using activity book OT is to provide. Was much more pleasant when had a goal/reward at end of session        Exercises     Shoulder Instructions       General Comments      Pertinent Vitals/ Pain       Pain Assessment: No/denies pain  Home Living                                          Prior Functioning/Environment              Frequency  Min 2X/week        Progress Toward Goals  OT Goals(current goals can now be found in the care plan section)  Progress towards OT goals: Progressing toward goals  Acute Rehab OT Goals Patient Stated Goal: I want to walk OT Goal Formulation: With patient Time For Goal Achievement: 03/24/20 Potential to Achieve Goals: [place] Discharge plan remains appropriate;Frequency remains appropriate    Co-evaluation                 AM-PAC OT "6 Clicks" Daily Activity     Outcome Measure   Help from another person eating meals?: A Little Help from another person taking care of personal grooming?: A Little Help from another person toileting, which includes using toliet, bedpan, or urinal?: A Lot Help from another person bathing (including washing, rinsing, drying)?: A Lot Help from another person to put on and taking off regular upper body clothing?: A Little Help from another person to put on and taking off regular lower body clothing?: A Little 6 Click Score: 16    End of Session Equipment Utilized During Treatment: Gait belt;Rolling walker  OT Visit Diagnosis: Other abnormalities of gait and mobility (R26.89);Muscle weakness (generalized) (M62.81);Other symptoms and signs involving cognitive function   Activity Tolerance Treatment limited secondary to agitation   Patient Left in bed;with call bell/phone within reach;with nursing/sitter in  room;Other (comment);with bed alarm set   Nurse Communication Mobility status        Time: 1041-1052 OT Time Calculation (min): 11 min  Charges: OT General Charges $OT Visit: 1 Visit OT Treatments $Self Care/Home Management : 8-22 mins $Therapeutic Activity: 8-22 mins  Zenovia Jarred, MSOT, OTR/L Acute Rehabilitation Services [facility] Office Number: [phone] Pager: [phone]  Zenovia Jarred 03/18/2020, 11:59 AM

## 2020-03-18 NOTE — Progress Notes (Signed)
Patients daughter [name] called to pick up patient after wheelchair was delivered.  Daughter stated she would be here within 20 minutes.  Patient packed, discharge paperwork completed.  Attempted to ask patient to sit in wheelchair to take her to the [place] front lobby to be picked up by daughter but she started screaming and cursing and refusing to sit down.  Patient then jerked her discharge paperwork out of my hand and threw it in the floor.  We picked up the discharge paperwork and placed it on the counter and she shoved it down the front of her sweatshirt.  Patient again started screaming and refusing to leave.  Hospital security was called and patient still refused to leave.  This nurse called the patients daughter back and asked for her to come inside to help take the patient outside to be discharged.  Patients daughter came to the floor and helped assist patient with security and nurse techs to be discharged outside to the lobby.

## 2020-03-18 NOTE — TOC Progression Note (Signed)
Transition of Care [facility]) - Progression Note    Patient Details  Name: Morgan Beasley MRN: [id number] Date of Birth: May 28, 1960  Transition of Care [facility] CM/SW [place], RN Phone Number: [phone]  03/18/2020, 3:31 PM  Clinical Narrative:    Patient suffers from ambulatory dysfunction which impairs their ability to perform daily activities like bathing, dressing, grooming, and toileting in the home. A walker will not resolve issue with performing activities of daily living. A wheelchair will allow patient to safely perform daily activities. Patient can safely propel the wheelchair in the home or has a caregiver who can provide assistance. Length of need 6 months .  Accessories: elevating leg rests (ELRs), wheel locks, extensions and anti-tippers.   Expected Discharge Plan: [place] Barriers to Discharge: No Barriers Identified  Expected Discharge Plan and Services Expected Discharge Plan: [place] Choice: [place] Living arrangements for the past 2 months: Single Family Home Expected Discharge Date: 03/18/20               DME Arranged: Wheelchair manual DME Agency: [employer] Date DME Agency Contacted: 03/18/20 Time DME Agency Contacted: [phone] Representative spoke with at DME Agency: [facility] ([place]) Interventions    Readmission Risk Interventions Readmission Risk Prevention Plan 03/18/2020 10/21/2019  Transportation Screening Complete Complete  Medication Review Press photographer) - Complete  PCP or Specialist appointment within 3-5 days of discharge Not Complete Complete  PCP/Specialist Appt Not Complete comments First available appt 12/1 -  [place] or [place] Complete Complete  SW Recovery Care/Counseling Consult Complete Complete  Palliative Care Screening Not Applicable Not [place] Not Applicable Not  Applicable  Some recent data might be hidden

## 2020-03-18 NOTE — Progress Notes (Signed)
Progress Note    03/18/2020 9:51 AM 14 Days Post-Op  Subjective:  Sitting up on side of bed in NAD   Vitals:   03/17/20 2123 03/18/20 0615  BP: 106/66   Pulse: 78 75  Resp: 18 20  Temp: 98.6 F (37 C) 98.5 F (36.9 C)  SpO2: 95% 94%    Physical Exam: Cardiac:  RRR Lungs:  Non-labored resp Extremities:  Left foot dressing just changed this morning. Dry and intact. She has brisk DP, PT and peroneal signals  ABIs done 11/3>>had to use lower leg pressure as she cannot have brachial pressure measured currently Right DP: 0.55 Left DP: 0.93 No toe pressures obtained  CBC    Component Value Date/Time   WBC 8.1 03/16/2020 0315   RBC 4.08 03/16/2020 0315   HGB 9.9 (L) 03/16/2020 0315   HGB 12.0 11/17/2019 1449   HCT 33.4 (L) 03/16/2020 0315   HCT 37.8 11/17/2019 1449   PLT 328 03/16/2020 0315   PLT 431 11/17/2019 1449   MCV 81.9 03/16/2020 0315   MCV 83 11/17/2019 1449   MCH 24.3 (L) 03/16/2020 0315   MCHC 29.6 (L) 03/16/2020 0315   RDW 20.4 (H) 03/16/2020 0315   RDW 17.5 (H) 11/17/2019 1449   LYMPHSABS 1.8 03/16/2020 0315   MONOABS 1.1 (H) 03/16/2020 0315   EOSABS 0.3 03/16/2020 0315   BASOSABS 0.0 03/16/2020 0315    BMET    Component Value Date/Time   NA 140 03/18/2020 0500   NA 137 02/09/2020 0825   K 3.9 03/18/2020 0500   CL 100 03/18/2020 0500   CO2 32 03/18/2020 0500   GLUCOSE 195 (H) 03/18/2020 0500   BUN 24 (H) 03/18/2020 0500   BUN 14 02/09/2020 0825   CREATININE 1.32 (H) 03/18/2020 0500   CALCIUM 8.6 (L) 03/18/2020 0500   GFRNONAA 47 (L) 03/18/2020 0500   GFRAA 65 02/09/2020 0825     Intake/Output Summary (Last 24 hours) at 03/18/2020 0951 Last data filed at 03/17/2020 1849 Gross per 24 hour  Intake 240 ml  Output --  Net 240 ml    HOSPITAL MEDICATIONS Scheduled Meds: . amitriptyline  100 mg Oral QHS  . atorvastatin  20 mg Oral Daily  . busPIRone  10 mg Oral BID  . carbamazepine  100 mg Oral BID  . Chlorhexidine Gluconate Cloth   6 each Topical Daily  . clopidogrel  75 mg Oral Daily  . collagenase   Topical Daily  . fluticasone furoate-vilanterol  1 puff Inhalation Daily  . furosemide  40 mg Oral Daily  . gabapentin  400 mg Oral TID  . heparin injection (subcutaneous)  5,000 Units Subcutaneous Q8H  . insulin aspart  0-15 Units Subcutaneous TID WC  . insulin aspart  0-5 Units Subcutaneous QHS  . insulin aspart  2 Units Subcutaneous TID WC  . insulin glargine  40 Units Subcutaneous Daily  . lipase/protease/amylase  72,000 Units Oral TID WC  . metoprolol succinate  50 mg Oral Daily  . multivitamin with minerals  1 tablet Oral Daily  . nicotine  7 mg Transdermal Daily  . pantoprazole  40 mg Oral Daily   Continuous Infusions: . ceFEPime (MAXIPIME) IV 2 g (03/18/20 0528)  . lactated ringers 10 mL/hr at 03/18/20 0526   PRN Meds:.acetaminophen **OR** [DISCONTINUED] acetaminophen, albuterol, dextrose, dextrose, lipase/protease/amylase, naLOXone (NARCAN)  injection, OLANZapine, [DISCONTINUED] ondansetron **OR** ondansetron (ZOFRAN) IV, oxyCODONE, polyethylene glycol  Assessment: PAD. Prior left fem-pop bypass. Underwent left PT trunk, PT and AT  artery angioplasty on 01/26/2020. Chronic left foot wound. ABIs done 03/03/2020  Plan: -Continue statin and Plavix. Will leave message with our office for follow-up with Dr. Donzetta Matters post-discharge. -DVT prophylaxis:  [place]   Risa Grill, PA-C [facility] [phone] 03/18/2020  9:51 AM

## 2020-03-18 NOTE — Progress Notes (Signed)
PT Cancellation Note  Patient Details Name: Morgan Beasley MRN: [id number] DOB: [birth year]   Cancelled Treatment:    Reason Eval/Treat Not Completed: Patient declined, no reason specified (pt sitting EOb on arrival, appeared calm. Introduced self and offered walking and mobility at which time pt became agitated stating "they put me here and I'm upset" "you can't walk me like a dog". Pt agitation increased and despite calming and reassurance pt continued to escalate and therapist exited with sitter present to decrease stimulus.   Marianela Mandrell [place] 03/18/2020, 10:29 AM  Bayard Males, PT Acute Rehabilitation Services Pager: [phone] Office: [phone]

## 2020-03-21 ENCOUNTER — Emergency Department (HOSPITAL_COMMUNITY): Payer: Medicare Other

## 2020-03-21 ENCOUNTER — Inpatient Hospital Stay (HOSPITAL_COMMUNITY)
Admission: EM | Admit: 2020-03-21 | Discharge: 2020-04-28 | DRG: 885 | Disposition: A | Discharge status: Skilled Nursing Facility | Payer: Medicare Other | Attending: Internal Medicine | Admitting: Internal Medicine

## 2020-03-21 ENCOUNTER — Encounter (HOSPITAL_COMMUNITY): Payer: Self-pay

## 2020-03-21 ENCOUNTER — Other Ambulatory Visit: Payer: Self-pay

## 2020-03-21 DIAGNOSIS — R591 Generalized enlarged lymph nodes: Secondary | ICD-10-CM | POA: Diagnosis present

## 2020-03-21 DIAGNOSIS — I6340 Cerebral infarction due to embolism of unspecified cerebral artery: Secondary | ICD-10-CM | POA: Diagnosis present

## 2020-03-21 DIAGNOSIS — G4733 Obstructive sleep apnea (adult) (pediatric): Secondary | ICD-10-CM | POA: Diagnosis present

## 2020-03-21 DIAGNOSIS — L97519 Non-pressure chronic ulcer of other part of right foot with unspecified severity: Secondary | ICD-10-CM | POA: Diagnosis present

## 2020-03-21 DIAGNOSIS — E876 Hypokalemia: Secondary | ICD-10-CM | POA: Diagnosis not present

## 2020-03-21 DIAGNOSIS — W19XXXA Unspecified fall, initial encounter: Secondary | ICD-10-CM

## 2020-03-21 DIAGNOSIS — Z888 Allergy status to other drugs, medicaments and biological substances status: Secondary | ICD-10-CM

## 2020-03-21 DIAGNOSIS — S91302A Unspecified open wound, left foot, initial encounter: Secondary | ICD-10-CM | POA: Diagnosis present

## 2020-03-21 DIAGNOSIS — Z20822 Contact with and (suspected) exposure to covid-19: Secondary | ICD-10-CM | POA: Diagnosis present

## 2020-03-21 DIAGNOSIS — R7881 Bacteremia: Secondary | ICD-10-CM | POA: Diagnosis present

## 2020-03-21 DIAGNOSIS — K861 Other chronic pancreatitis: Secondary | ICD-10-CM | POA: Diagnosis present

## 2020-03-21 DIAGNOSIS — R109 Unspecified abdominal pain: Secondary | ICD-10-CM | POA: Diagnosis not present

## 2020-03-21 DIAGNOSIS — Z781 Physical restraint status: Secondary | ICD-10-CM

## 2020-03-21 DIAGNOSIS — R590 Localized enlarged lymph nodes: Secondary | ICD-10-CM | POA: Diagnosis present

## 2020-03-21 DIAGNOSIS — G9341 Metabolic encephalopathy: Secondary | ICD-10-CM | POA: Diagnosis present

## 2020-03-21 DIAGNOSIS — R601 Generalized edema: Secondary | ICD-10-CM | POA: Diagnosis not present

## 2020-03-21 DIAGNOSIS — I130 Hypertensive heart and chronic kidney disease with heart failure and stage 1 through stage 4 chronic kidney disease, or unspecified chronic kidney disease: Secondary | ICD-10-CM | POA: Diagnosis present

## 2020-03-21 DIAGNOSIS — K567 Ileus, unspecified: Secondary | ICD-10-CM | POA: Diagnosis not present

## 2020-03-21 DIAGNOSIS — R410 Disorientation, unspecified: Secondary | ICD-10-CM | POA: Diagnosis not present

## 2020-03-21 DIAGNOSIS — M79604 Pain in right leg: Secondary | ICD-10-CM | POA: Diagnosis not present

## 2020-03-21 DIAGNOSIS — E782 Mixed hyperlipidemia: Secondary | ICD-10-CM | POA: Diagnosis present

## 2020-03-21 DIAGNOSIS — N1831 Chronic kidney disease, stage 3a: Secondary | ICD-10-CM | POA: Diagnosis present

## 2020-03-21 DIAGNOSIS — K5900 Constipation, unspecified: Secondary | ICD-10-CM

## 2020-03-21 DIAGNOSIS — Z9181 History of falling: Secondary | ICD-10-CM

## 2020-03-21 DIAGNOSIS — E8770 Fluid overload, unspecified: Secondary | ICD-10-CM | POA: Diagnosis present

## 2020-03-21 DIAGNOSIS — E1142 Type 2 diabetes mellitus with diabetic polyneuropathy: Secondary | ICD-10-CM | POA: Diagnosis present

## 2020-03-21 DIAGNOSIS — J90 Pleural effusion, not elsewhere classified: Secondary | ICD-10-CM

## 2020-03-21 DIAGNOSIS — N179 Acute kidney failure, unspecified: Secondary | ICD-10-CM | POA: Diagnosis present

## 2020-03-21 DIAGNOSIS — E1151 Type 2 diabetes mellitus with diabetic peripheral angiopathy without gangrene: Secondary | ICD-10-CM | POA: Diagnosis present

## 2020-03-21 DIAGNOSIS — I69322 Dysarthria following cerebral infarction: Secondary | ICD-10-CM

## 2020-03-21 DIAGNOSIS — M79606 Pain in leg, unspecified: Secondary | ICD-10-CM

## 2020-03-21 DIAGNOSIS — J9601 Acute respiratory failure with hypoxia: Secondary | ICD-10-CM | POA: Diagnosis not present

## 2020-03-21 DIAGNOSIS — D631 Anemia in chronic kidney disease: Secondary | ICD-10-CM | POA: Diagnosis present

## 2020-03-21 DIAGNOSIS — G6289 Other specified polyneuropathies: Secondary | ICD-10-CM | POA: Diagnosis not present

## 2020-03-21 DIAGNOSIS — I509 Heart failure, unspecified: Secondary | ICD-10-CM | POA: Diagnosis not present

## 2020-03-21 DIAGNOSIS — J441 Chronic obstructive pulmonary disease with (acute) exacerbation: Secondary | ICD-10-CM | POA: Diagnosis not present

## 2020-03-21 DIAGNOSIS — F05 Delirium due to known physiological condition: Secondary | ICD-10-CM | POA: Diagnosis present

## 2020-03-21 DIAGNOSIS — F419 Anxiety disorder, unspecified: Secondary | ICD-10-CM | POA: Diagnosis present

## 2020-03-21 DIAGNOSIS — Z6835 Body mass index (BMI) 35.0-35.9, adult: Secondary | ICD-10-CM

## 2020-03-21 DIAGNOSIS — F1910 Other psychoactive substance abuse, uncomplicated: Secondary | ICD-10-CM | POA: Diagnosis present

## 2020-03-21 DIAGNOSIS — Z886 Allergy status to analgesic agent status: Secondary | ICD-10-CM

## 2020-03-21 DIAGNOSIS — F29 Unspecified psychosis not due to a substance or known physiological condition: Secondary | ICD-10-CM | POA: Diagnosis present

## 2020-03-21 DIAGNOSIS — F319 Bipolar disorder, unspecified: Secondary | ICD-10-CM | POA: Diagnosis present

## 2020-03-21 DIAGNOSIS — R140 Abdominal distension (gaseous): Secondary | ICD-10-CM

## 2020-03-21 DIAGNOSIS — J449 Chronic obstructive pulmonary disease, unspecified: Secondary | ICD-10-CM | POA: Diagnosis present

## 2020-03-21 DIAGNOSIS — M109 Gout, unspecified: Secondary | ICD-10-CM | POA: Diagnosis present

## 2020-03-21 DIAGNOSIS — I63431 Cerebral infarction due to embolism of right posterior cerebral artery: Secondary | ICD-10-CM | POA: Diagnosis not present

## 2020-03-21 DIAGNOSIS — R0602 Shortness of breath: Secondary | ICD-10-CM

## 2020-03-21 DIAGNOSIS — F22 Delusional disorders: Secondary | ICD-10-CM | POA: Diagnosis present

## 2020-03-21 DIAGNOSIS — Z8249 Family history of ischemic heart disease and other diseases of the circulatory system: Secondary | ICD-10-CM

## 2020-03-21 DIAGNOSIS — Z9114 Patient's other noncompliance with medication regimen: Secondary | ICD-10-CM

## 2020-03-21 DIAGNOSIS — Z515 Encounter for palliative care: Secondary | ICD-10-CM | POA: Diagnosis not present

## 2020-03-21 DIAGNOSIS — E11621 Type 2 diabetes mellitus with foot ulcer: Secondary | ICD-10-CM | POA: Diagnosis present

## 2020-03-21 DIAGNOSIS — I252 Old myocardial infarction: Secondary | ICD-10-CM

## 2020-03-21 DIAGNOSIS — G629 Polyneuropathy, unspecified: Secondary | ICD-10-CM

## 2020-03-21 DIAGNOSIS — Z7902 Long term (current) use of antithrombotics/antiplatelets: Secondary | ICD-10-CM

## 2020-03-21 DIAGNOSIS — Z833 Family history of diabetes mellitus: Secondary | ICD-10-CM

## 2020-03-21 DIAGNOSIS — K56609 Unspecified intestinal obstruction, unspecified as to partial versus complete obstruction: Secondary | ICD-10-CM | POA: Diagnosis not present

## 2020-03-21 DIAGNOSIS — Z823 Family history of stroke: Secondary | ICD-10-CM

## 2020-03-21 DIAGNOSIS — F1410 Cocaine abuse, uncomplicated: Secondary | ICD-10-CM | POA: Diagnosis present

## 2020-03-21 DIAGNOSIS — Z853 Personal history of malignant neoplasm of breast: Secondary | ICD-10-CM | POA: Diagnosis not present

## 2020-03-21 DIAGNOSIS — Z79899 Other long term (current) drug therapy: Secondary | ICD-10-CM

## 2020-03-21 DIAGNOSIS — F1020 Alcohol dependence, uncomplicated: Secondary | ICD-10-CM | POA: Diagnosis present

## 2020-03-21 DIAGNOSIS — Z09 Encounter for follow-up examination after completed treatment for conditions other than malignant neoplasm: Secondary | ICD-10-CM

## 2020-03-21 DIAGNOSIS — E669 Obesity, unspecified: Secondary | ICD-10-CM | POA: Diagnosis present

## 2020-03-21 DIAGNOSIS — R609 Edema, unspecified: Secondary | ICD-10-CM | POA: Diagnosis not present

## 2020-03-21 DIAGNOSIS — I5021 Acute systolic (congestive) heart failure: Secondary | ICD-10-CM | POA: Diagnosis not present

## 2020-03-21 DIAGNOSIS — J810 Acute pulmonary edema: Secondary | ICD-10-CM | POA: Diagnosis not present

## 2020-03-21 DIAGNOSIS — R000 Tachycardia, unspecified: Secondary | ICD-10-CM | POA: Diagnosis present

## 2020-03-21 DIAGNOSIS — R112 Nausea with vomiting, unspecified: Secondary | ICD-10-CM | POA: Diagnosis not present

## 2020-03-21 DIAGNOSIS — Z803 Family history of malignant neoplasm of breast: Secondary | ICD-10-CM

## 2020-03-21 DIAGNOSIS — Z7951 Long term (current) use of inhaled steroids: Secondary | ICD-10-CM

## 2020-03-21 DIAGNOSIS — I5043 Acute on chronic combined systolic (congestive) and diastolic (congestive) heart failure: Secondary | ICD-10-CM | POA: Diagnosis present

## 2020-03-21 DIAGNOSIS — R8271 Bacteriuria: Secondary | ICD-10-CM | POA: Diagnosis present

## 2020-03-21 DIAGNOSIS — F17210 Nicotine dependence, cigarettes, uncomplicated: Secondary | ICD-10-CM | POA: Diagnosis present

## 2020-03-21 DIAGNOSIS — Z0189 Encounter for other specified special examinations: Secondary | ICD-10-CM

## 2020-03-21 DIAGNOSIS — I378 Other nonrheumatic pulmonary valve disorders: Secondary | ICD-10-CM | POA: Diagnosis present

## 2020-03-21 DIAGNOSIS — Z794 Long term (current) use of insulin: Secondary | ICD-10-CM

## 2020-03-21 DIAGNOSIS — B9689 Other specified bacterial agents as the cause of diseases classified elsewhere: Secondary | ICD-10-CM | POA: Diagnosis present

## 2020-03-21 DIAGNOSIS — G9340 Encephalopathy, unspecified: Secondary | ICD-10-CM | POA: Diagnosis not present

## 2020-03-21 DIAGNOSIS — I2510 Atherosclerotic heart disease of native coronary artery without angina pectoris: Secondary | ICD-10-CM | POA: Diagnosis present

## 2020-03-21 DIAGNOSIS — Z8719 Personal history of other diseases of the digestive system: Secondary | ICD-10-CM

## 2020-03-21 DIAGNOSIS — E1010 Type 1 diabetes mellitus with ketoacidosis without coma: Secondary | ICD-10-CM

## 2020-03-21 DIAGNOSIS — E1122 Type 2 diabetes mellitus with diabetic chronic kidney disease: Secondary | ICD-10-CM | POA: Diagnosis present

## 2020-03-21 DIAGNOSIS — F1290 Cannabis use, unspecified, uncomplicated: Secondary | ICD-10-CM | POA: Diagnosis present

## 2020-03-21 DIAGNOSIS — Z9011 Acquired absence of right breast and nipple: Secondary | ICD-10-CM

## 2020-03-21 LAB — CBC WITH DIFFERENTIAL/PLATELET
Abs Immature Granulocytes: 0.02 10*3/uL (ref 0.00–0.07)
Basophils Absolute: 0 10*3/uL (ref 0.0–0.1)
Basophils Relative: 0 %
Eosinophils Absolute: 0.1 10*3/uL (ref 0.0–0.5)
Eosinophils Relative: 1 %
HCT: 35 % — ABNORMAL LOW (ref 36.0–46.0)
Hemoglobin: 10.7 g/dL — ABNORMAL LOW (ref 12.0–15.0)
Immature Granulocytes: 0 %
Lymphocytes Relative: 19 %
Lymphs Abs: 1.7 10*3/uL (ref 0.7–4.0)
MCH: 24.2 pg — ABNORMAL LOW (ref 26.0–34.0)
MCHC: 30.6 g/dL (ref 30.0–36.0)
MCV: 79.2 fL — ABNORMAL LOW (ref 80.0–100.0)
Monocytes Absolute: 0.9 10*3/uL (ref 0.1–1.0)
Monocytes Relative: 10 %
Neutro Abs: 6.2 10*3/uL (ref 1.7–7.7)
Neutrophils Relative %: 70 %
Platelets: 382 10*3/uL (ref 150–400)
RBC: 4.42 MIL/uL (ref 3.87–5.11)
RDW: 20.3 % — ABNORMAL HIGH (ref 11.5–15.5)
WBC: 8.9 10*3/uL (ref 4.0–10.5)
nRBC: 0.9 % — ABNORMAL HIGH (ref 0.0–0.2)

## 2020-03-21 LAB — URINALYSIS, ROUTINE W REFLEX MICROSCOPIC
Bilirubin Urine: NEGATIVE
Glucose, UA: NEGATIVE mg/dL
Ketones, ur: NEGATIVE mg/dL
Leukocytes,Ua: NEGATIVE
Nitrite: NEGATIVE
Protein, ur: 100 mg/dL — AB
Specific Gravity, Urine: 1.006 (ref 1.005–1.030)
pH: 8 (ref 5.0–8.0)

## 2020-03-21 LAB — COMPREHENSIVE METABOLIC PANEL
ALT: 22 U/L (ref 0–44)
AST: 31 U/L (ref 15–41)
Albumin: 2.4 g/dL — ABNORMAL LOW (ref 3.5–5.0)
Alkaline Phosphatase: 203 U/L — ABNORMAL HIGH (ref 38–126)
Anion gap: 13 (ref 5–15)
BUN: 17 mg/dL (ref 6–20)
CO2: 28 mmol/L (ref 22–32)
Calcium: 8.4 mg/dL — ABNORMAL LOW (ref 8.9–10.3)
Chloride: 100 mmol/L (ref 98–111)
Creatinine, Ser: 0.96 mg/dL (ref 0.44–1.00)
GFR, Estimated: >60 mL/min (ref >60)
Glucose, Bld: 100 mg/dL — ABNORMAL HIGH (ref 70–99)
Potassium: 3.4 mmol/L — ABNORMAL LOW (ref 3.5–5.1)
Sodium: 141 mmol/L (ref 135–145)
Total Bilirubin: 0.9 mg/dL (ref 0.3–1.2)
Total Protein: 7.1 g/dL (ref 6.5–8.1)

## 2020-03-21 LAB — RESP PANEL BY RT-PCR (FLU A&B, COVID) ARPGX2
Influenza A by PCR: NEGATIVE
Influenza B by PCR: NEGATIVE
SARS Coronavirus 2 by RT PCR: NEGATIVE

## 2020-03-21 LAB — PROTIME-INR
INR: 1.4 — ABNORMAL HIGH (ref 0.8–1.2)
Prothrombin Time: 16.7 seconds — ABNORMAL HIGH (ref 11.4–15.2)

## 2020-03-21 LAB — TROPONIN I (HIGH SENSITIVITY)
Troponin I (High Sensitivity): 73 ng/L — ABNORMAL HIGH (ref <18)
Troponin I (High Sensitivity): 69 ng/L — ABNORMAL HIGH (ref <18)

## 2020-03-21 LAB — I-STAT CHEM 8, ED
BUN: 17 mg/dL (ref 6–20)
Calcium, Ion: 1.11 mmol/L — ABNORMAL LOW (ref 1.15–1.40)
Chloride: 98 mmol/L (ref 98–111)
Creatinine, Ser: 1 mg/dL (ref 0.44–1.00)
Glucose, Bld: 99 mg/dL (ref 70–99)
HCT: 37 % (ref 36.0–46.0)
Hemoglobin: 12.6 g/dL (ref 12.0–15.0)
Potassium: 3.4 mmol/L — ABNORMAL LOW (ref 3.5–5.1)
Sodium: 142 mmol/L (ref 135–145)
TCO2: 29 mmol/L (ref 22–32)

## 2020-03-21 LAB — GLUCOSE, CAPILLARY: Glucose-Capillary: 72 mg/dL (ref 70–99)

## 2020-03-21 LAB — BRAIN NATRIURETIC PEPTIDE: B Natriuretic Peptide: 1352.8 pg/mL — ABNORMAL HIGH (ref 0.0–100.0)

## 2020-03-21 LAB — APTT: aPTT: 31 seconds (ref 24–36)

## 2020-03-21 LAB — LACTIC ACID, PLASMA: Lactic Acid, Venous: 1.4 mmol/L (ref 0.5–1.9)

## 2020-03-21 MED ORDER — INSULIN ASPART 100 UNIT/ML ~~LOC~~ SOLN: 0.0000 [IU] | Freq: Every day | SUBCUTANEOUS | Status: DC

## 2020-03-21 MED ORDER — CHLORHEXIDINE GLUCONATE CLOTH 2 % EX PADS
6.0000 | MEDICATED_PAD | Freq: Every day | CUTANEOUS | Status: DC
  Administered 2020-03-21 – 2020-03-25 (×4): 6 via TOPICAL

## 2020-03-21 MED ORDER — ALTEPLASE 2 MG IJ SOLR
2.0000 mg | Freq: Once | INTRAMUSCULAR | Status: AC
  Administered 2020-03-21: 2 mg
  Filled 2020-03-21: qty 2

## 2020-03-21 MED ORDER — INSULIN ASPART 100 UNIT/ML ~~LOC~~ SOLN: 0.0000 [IU] | Freq: Three times a day (TID) | SUBCUTANEOUS | Status: DC

## 2020-03-21 MED ORDER — SODIUM CHLORIDE 0.9% FLUSH: 10.0000 mL | INTRAVENOUS | Status: DC | PRN

## 2020-03-21 MED ORDER — PANCRELIPASE (LIP-PROT-AMYL) 12000-38000 UNITS PO CPEP
36000.0000 [IU] | ORAL_CAPSULE | ORAL | Status: DC
Start: 2020-03-21 — End: 2020-03-21

## 2020-03-21 MED ORDER — METOPROLOL SUCCINATE ER 50 MG PO TB24
50.0000 mg | ORAL_TABLET | Freq: Every day | ORAL | Status: DC
  Administered 2020-03-22 – 2020-04-28 (×31): 50 mg via ORAL
  Filled 2020-03-21 (×9): qty 1
  Filled 2020-03-21: qty 2
  Filled 2020-03-21 (×23): qty 1
  Filled 2020-03-21 (×2): qty 2
  Filled 2020-03-21: qty 1

## 2020-03-21 MED ORDER — SODIUM CHLORIDE (PF) 0.9 % IJ SOLN
INTRAMUSCULAR | Status: AC
  Filled 2020-03-21: qty 50

## 2020-03-21 MED ORDER — INSULIN GLARGINE 100 UNIT/ML ~~LOC~~ SOLN
20.0000 [IU] | Freq: Every day | SUBCUTANEOUS | Status: DC
  Administered 2020-03-21 – 2020-03-22 (×2): 20 [IU] via SUBCUTANEOUS
  Filled 2020-03-21 (×3): qty 0.2

## 2020-03-21 MED ORDER — DAPAGLIFLOZIN PROPANEDIOL 10 MG PO TABS
10.0000 mg | ORAL_TABLET | Freq: Every day | ORAL | Status: DC
  Filled 2020-03-21 (×2): qty 1

## 2020-03-21 MED ORDER — SODIUM CHLORIDE 0.9 % IV SOLN
2.0000 g | Freq: Three times a day (TID) | INTRAVENOUS | Status: AC
  Administered 2020-03-21 – 2020-03-22 (×3): 2 g via INTRAVENOUS
  Filled 2020-03-21 (×4): qty 2

## 2020-03-21 MED ORDER — ATORVASTATIN CALCIUM 20 MG PO TABS
20.0000 mg | ORAL_TABLET | Freq: Every day | ORAL | Status: DC
  Administered 2020-03-23 – 2020-04-19 (×23): 20 mg via ORAL
  Filled 2020-03-21 (×6): qty 1
  Filled 2020-03-21: qty 2
  Filled 2020-03-21 (×3): qty 1
  Filled 2020-03-21: qty 2
  Filled 2020-03-21 (×17): qty 1

## 2020-03-21 MED ORDER — OXYCODONE HCL 5 MG PO TABS
5.0000 mg | ORAL_TABLET | Freq: Four times a day (QID) | ORAL | Status: DC | PRN
  Administered 2020-03-21 – 2020-03-25 (×5): 5 mg via ORAL
  Filled 2020-03-21 (×5): qty 1

## 2020-03-21 MED ORDER — FUROSEMIDE 10 MG/ML IJ SOLN
40.0000 mg | Freq: Every day | INTRAMUSCULAR | Status: DC
  Administered 2020-03-22: 40 mg via INTRAVENOUS
  Filled 2020-03-21: qty 4

## 2020-03-21 MED ORDER — MOMETASONE FURO-FORMOTEROL FUM 200-5 MCG/ACT IN AERO
2.0000 | INHALATION_SPRAY | Freq: Two times a day (BID) | RESPIRATORY_TRACT | Status: DC
  Administered 2020-03-21 – 2020-04-28 (×39): 2 via RESPIRATORY_TRACT
  Filled 2020-03-21 (×2): qty 8.8

## 2020-03-21 MED ORDER — STERILE WATER FOR INJECTION IJ SOLN
INTRAMUSCULAR | Status: AC
  Filled 2020-03-21: qty 10

## 2020-03-21 MED ORDER — FUROSEMIDE 10 MG/ML IJ SOLN
40.0000 mg | Freq: Once | INTRAMUSCULAR | Status: AC
  Administered 2020-03-21: 40 mg via INTRAVENOUS
  Filled 2020-03-21: qty 4

## 2020-03-21 MED ORDER — ALBUTEROL SULFATE HFA 108 (90 BASE) MCG/ACT IN AERS
2.0000 | INHALATION_SPRAY | Freq: Four times a day (QID) | RESPIRATORY_TRACT | Status: DC | PRN
  Filled 2020-03-21: qty 6.7

## 2020-03-21 MED ORDER — AMITRIPTYLINE HCL 25 MG PO TABS
100.0000 mg | ORAL_TABLET | Freq: Every day | ORAL | Status: DC
  Administered 2020-03-21 – 2020-04-02 (×12): 100 mg via ORAL
  Filled 2020-03-21 (×14): qty 4

## 2020-03-21 MED ORDER — IOHEXOL 300 MG/ML  SOLN
100.0000 mL | Freq: Once | INTRAMUSCULAR | Status: AC | PRN
  Administered 2020-03-21: 100 mL via INTRAVENOUS

## 2020-03-21 MED ORDER — HYDROXYZINE HCL 25 MG PO TABS
25.0000 mg | ORAL_TABLET | Freq: Once | ORAL | Status: AC
  Administered 2020-03-21: 25 mg via ORAL
  Filled 2020-03-21: qty 1

## 2020-03-21 MED ORDER — CLOPIDOGREL BISULFATE 75 MG PO TABS
75.0000 mg | ORAL_TABLET | Freq: Every day | ORAL | Status: DC
  Administered 2020-03-21 – 2020-04-19 (×26): 75 mg via ORAL
  Filled 2020-03-21 (×29): qty 1

## 2020-03-21 MED ORDER — OLANZAPINE 10 MG IM SOLR
10.0000 mg | Freq: Once | INTRAMUSCULAR | Status: AC
  Administered 2020-03-21: 10 mg via INTRAMUSCULAR
  Filled 2020-03-21: qty 10

## 2020-03-21 MED ORDER — POTASSIUM CHLORIDE CRYS ER 20 MEQ PO TBCR
40.0000 meq | EXTENDED_RELEASE_TABLET | Freq: Two times a day (BID) | ORAL | Status: DC
  Administered 2020-03-21 – 2020-03-30 (×13): 40 meq via ORAL
  Filled 2020-03-21 (×16): qty 2

## 2020-03-21 MED ORDER — PANCRELIPASE (LIP-PROT-AMYL) 36000-114000 UNITS PO CPEP
72000.0000 [IU] | ORAL_CAPSULE | Freq: Three times a day (TID) | ORAL | Status: DC
  Administered 2020-03-21 – 2020-04-28 (×45): 72000 [IU] via ORAL
  Filled 2020-03-21: qty 6
  Filled 2020-03-21 (×4): qty 2
  Filled 2020-03-21: qty 6
  Filled 2020-03-21 (×3): qty 2
  Filled 2020-03-21 (×2): qty 6
  Filled 2020-03-21 (×9): qty 2
  Filled 2020-03-21: qty 6
  Filled 2020-03-21 (×18): qty 2
  Filled 2020-03-21 (×2): qty 6
  Filled 2020-03-21 (×8): qty 2
  Filled 2020-03-21: qty 6
  Filled 2020-03-21: qty 2
  Filled 2020-03-21: qty 6
  Filled 2020-03-21 (×3): qty 2
  Filled 2020-03-21 (×2): qty 6
  Filled 2020-03-21: qty 2
  Filled 2020-03-21 (×2): qty 6
  Filled 2020-03-21: qty 2
  Filled 2020-03-21: qty 6
  Filled 2020-03-21 (×8): qty 2
  Filled 2020-03-21: qty 6
  Filled 2020-03-21 (×9): qty 2
  Filled 2020-03-21 (×2): qty 6
  Filled 2020-03-21 (×4): qty 2
  Filled 2020-03-21: qty 6
  Filled 2020-03-21 (×2): qty 2
  Filled 2020-03-21: qty 6
  Filled 2020-03-21 (×3): qty 2

## 2020-03-21 MED ORDER — PANCRELIPASE (LIP-PROT-AMYL) 12000-38000 UNITS PO CPEP
36000.0000 [IU] | ORAL_CAPSULE | Freq: Three times a day (TID) | ORAL | Status: DC | PRN
  Administered 2020-03-30: 36000 [IU] via ORAL
  Filled 2020-03-21 (×2): qty 3
  Filled 2020-03-21: qty 1

## 2020-03-21 NOTE — ED Provider Notes (Signed)
[facility] DEPT Provider Note   CSN: [id number] Arrival date & time: 03/21/20  1771     History Chief Complaint  Patient presents with  . Weakness  . Shortness of Breath    Kristyne Woodring is a 59 y.o. female.  Arta Stump is a 59 y.o. female with a history of CHF, CKD, COPD, diabetes, hypertension, hyperlipidemia, CAD, PAD, chronic wound to the left foot with recent admission for gram-negative bacteremia and septic shock, she was discharged from the hospital 3 days ago on 11/18.  On review of admission it appears that patient was treated with IV antibiotics, she did experience delirium during her hospital admission but per discharge summary mental status had improved upon discharge, and she was discharged home with a PICC line to continue at home IV antibiotics.  Per EMS patient has become increasingly confused since being at home having repeated hallucinations that man is in the home.  Patient has been staying with her daughter since discharge and daughter reports that she has been more agitated.  Daughter also reports that she has had significantly worsening swelling in her arms, legs and abdomen since discharge. Patient reports feeling swollen and sore all over. Also reports having some chest pain and shortness of breath. Reports that her abdomen is swollen with some upper abdominal pain but she denies vomiting or diarrhea. States she has not been able to eat much over the past few days. Patient unable to identify other focal symptoms. Her mood is very labile and she is intermittently agitated. Daughter reports that she completed her final dose of home IV antibiotics through her PICC line last night. Daughter was not at home early this morning when the patient called EMS, patient states she called EMS because she thought that multiple people were in the house. Her daughter was staying somewhere else and did not know she was in the hospital until I  called her.        Past Medical History:  Diagnosis Date  . Alcohol dependence ([place])   . Anemia   . Anxiety   . Breast cancer ([place])   . Chronic combined systolic and diastolic CHF (congestive heart failure) ([place])   . Cigarette nicotine dependence   . CKD (chronic kidney disease), stage III ([place])   . Colon polyps   . COPD (chronic obstructive pulmonary disease) ([name])   . Diabetes mellitus without complication ([place])   . Diabetic neuropathy ([place])   . Gout   . Hyperlipemia   . Hypertension   . Insomnia   . Lymphedema   . Marijuana use   . Mild CAD 2016   a. NSTEMI 2016 in context of cocaine abuse, 50% RCA% at that time.  . OSA treated with BiPAP   . PAD (peripheral artery disease) ([place])    a. s/p L SFA stenting 08/2019. b. left fem-to-below-knee-popliteal bypass 09/2019.  Marland Kitchen Pancreatitis    acute on chronic due to ETOH initially.    . Sleep apnea    wears BIPAP  . Ulcer of foot [facility])    right    Patient Active Problem List   Diagnosis Date Noted  . Anasarca 03/21/2020  . Cerebral embolism with cerebral infarction 03/01/2020  . Septic shock ([place]) 02/23/2020  . Chronic systolic CHF (congestive heart failure) ([place]) 02/23/2020  . Obesity (BMI 30-39.9) 02/23/2020  . Ischemia of left lower extremity 10/09/2019  . PVD (peripheral vascular disease) ([place])   . Diabetic peripheral neuropathy associated with type  2 diabetes mellitus ([place])   . Acute on chronic diastolic congestive heart failure ([place])   . Lower extremity edema   . Dyspnea 09/21/2019  . Bronchitis 02/25/2019  . Uncontrolled diabetes mellitus with circulatory complication, with long-term current use of insulin ([place]) 02/25/2019  . Peripheral neuropathy 02/25/2019  . COPD (chronic obstructive pulmonary disease) ([place]) 02/25/2019  . Malignant neoplasm of upper-outer quadrant of right breast in female, estrogen receptor positive ([place]) 01/08/2019  . DKA (diabetic ketoacidosis) ([place]) 02/20/2018  . Hx-TIA (transient  ischemic attack) 02/20/2018  . Mixed hyperlipidemia 02/20/2018  . OAB (overactive bladder) 10/24/2017  . Poorly controlled diabetes mellitus ([place]) 10/24/2017  . Recurrent UTI 10/24/2017  . Gastritis, Helicobacter pylori 27/51/7001  . Microscopic hematuria 09/26/2016  . Intractable vomiting 09/08/2016  . Chronic bilateral upper abdominal pain 09/08/2016  . Sleep disturbance 06/22/2016  . Tobacco dependence 01/16/2016  . Cocaine abuse ([place]) 04/24/2015  . 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 Lymphedema of arm 02/06/2015  . Anxiety and depression 06/05/2014  . 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 Gastro-esophageal reflux disease without esophagitis 04/06/2014  . Other fatigue 04/06/2014  . Snoring 04/06/2014  . Other abnormal and inconclusive findings on diagnostic imaging of breast 04/03/2014  . Obstructive sleep apnea (adult) (pediatric) 03/06/2014  . Benign essential HTN 02/18/2014    Past Surgical History:  Procedure Laterality Date  . ABDOMINAL AORTOGRAM W/LOWER EXTREMITY N/A 09/26/2019   Procedure: ABDOMINAL AORTOGRAM W/LOWER EXTREMITY;  Surgeon: Angelia Mould, MD;  Location: [place] [facility] LAB;  Service: Cardiovascular;  Laterality: N/A;  . ABDOMINAL AORTOGRAM W/LOWER EXTREMITY Bilateral 12/08/2019   Procedure: ABDOMINAL AORTOGRAM W/LOWER EXTREMITY;  Surgeon: Waynetta Sandy, MD;  Location: [facility] LAB;  Service: Cardiovascular;  Laterality: Bilateral;  . ABDOMINAL AORTOGRAM W/LOWER EXTREMITY Left 01/26/2020   Procedure: ABDOMINAL AORTOGRAM W/LOWER EXTREMITY;  Surgeon: Waynetta Sandy, MD;  Location: [facility] LAB;  Service: Cardiovascular;  Laterality: Left;  . ABDOMINAL HYSTERECTOMY    . BUBBLE STUDY  03/04/2020   Procedure: BUBBLE STUDY;  Surgeon: Elouise Munroe, MD;  Location[facility] ENDOSCOPY;  Service: Cardiology;;  . [facility]  . FEMORAL-POPLITEAL BYPASS GRAFT Left 10/14/2019   Procedure:  BYPASS GRAFT LEFT FEMORAL-POPLITEAL ARTERY USING NONREVERSED SAPHENOUS VEIN;  Surgeon: Waynetta Sandy, MD;  Location: [facility];  Service: Vascular;  Laterality: Left;  Marland Kitchen MASTECTOMY Right April 2016  . PERIPHERAL VASCULAR ATHERECTOMY Left 01/26/2020   Procedure: PERIPHERAL VASCULAR ATHERECTOMY;  Surgeon: Waynetta Sandy, MD;  Location: [facility] LAB;  Service: Cardiovascular;  Laterality: Left;  PT and AT - [facility]  . PERIPHERAL VASCULAR BALLOON ANGIOPLASTY Left 01/26/2020   Procedure: PERIPHERAL VASCULAR BALLOON ANGIOPLASTY;  Surgeon: Waynetta Sandy, MD;  Location: [facility] LAB;  Service: Cardiovascular;  Laterality: Left;  [place]   . PERIPHERAL VASCULAR INTERVENTION Left 09/26/2019   Procedure: PERIPHERAL VASCULAR INTERVENTION;  Surgeon: Angelia Mould, MD;  Location: [place] [facility] LAB;  Service: Cardiovascular;  Laterality: Left;  superficial femoral  . TEE WITHOUT CARDIOVERSION N/A 03/04/2020   Procedure: TRANSESOPHAGEAL ECHOCARDIOGRAM (TEE);  Surgeon: Elouise Munroe, MD;  Location: [facility];  Service: Cardiology;  Laterality: N/A;     OB History   No obstetric history on file.     Family History  Problem Relation Age of Onset  . Diabetes Other   . Heart disease Other   . Breast cancer Maternal Grandmother   . Breast  cancer Paternal Grandmother   . Stroke Son   . Colon cancer Neg Hx   . Esophageal cancer Neg Hx   . Rectal cancer Neg Hx   . Stomach cancer Neg Hx     Social History   Tobacco Use  . Smoking status: Light Tobacco Smoker    Types: Cigarettes  . Smokeless tobacco: Never Used  . Tobacco comment: 3 cigarettes a day  Vaping Use  . Vaping Use: Never used  Substance Use Topics  . Alcohol use: Not Currently    Comment: Beer - "on the weekends hanging out, maybe 2 or 3"   . Drug use: Not Currently    Types: Marijuana    Comment: last used 8 2020    Home Medications Prior to Admission medications   Medication  Sig Start Date End Date Taking? Authorizing Provider  albuterol (PROAIR HFA) 108 (90 Base) MCG/ACT inhaler Inhale 2 puffs into the lungs every 6 (six) hours as needed for wheezing or shortness of breath.  05/19/14  Yes [provider]  amitriptyline (ELAVIL) 100 MG tablet Take 100 mg by mouth at bedtime.    Yes [provider]  atorvastatin (LIPITOR) 20 MG tablet Take 1 tablet (20 mg total) by mouth daily. 03/19/20 04/18/20 Yes Arrien, Jimmy Picket, MD  budesonide-formoterol [facility]) 160-4.5 MCG/ACT inhaler Inhale 2 puffs into the lungs 2 (two) times daily.   Yes [provider]  clopidogrel (PLAVIX) 75 MG tablet Take 1 tablet (75 mg total) by mouth daily. 09/27/19 09/26/20 Yes Brimage, Ronnette Juniper, DO  dapagliflozin propanediol (FARXIGA) 10 MG TABS tablet Take 1 tablet (10 mg total) by mouth daily before breakfast. 09/26/19  Yes Angelia Mould, MD  furosemide (LASIX) 40 MG tablet Take 1 tablet (40 mg total) by mouth daily. 03/19/20 04/18/20 Yes Arrien, Jimmy Picket, MD  gabapentin (NEURONTIN) 400 MG capsule Take 1 capsule (400 mg total) by mouth 3 (three) times daily for 30 doses. 03/18/20 03/28/20 Yes Arrien, Jimmy Picket, MD  insulin glargine (LANTUS) 100 UNIT/ML injection Inject 0.4 mLs (40 Units total) into the skin daily. Patient taking differently: Inject 80 Units into the skin daily.  09/27/19  Yes Brimage, Vondra, DO  insulin lispro (HUMALOG) 100 UNIT/ML injection Inject 14-20 Units into the skin 3 (three) times daily before meals.    Yes [provider]  lipase/protease/amylase (CREON) 36000 UNITS CPEP capsule Take 2 capsule by mouth with each meal and 1 capsule by mouth with each snack Patient taking differently: Take 36,000-72,000 Units by mouth See admin instructions. Take 72000 units by mouth with each meal and 36000 units by mouth with each snack 09/17/19  Yes Armbruster, Carlota Raspberry, MD  LORazepam (ATIVAN) 0.5 MG tablet Take 1 tablet (0.5 mg  total) by mouth every 6 (six) hours as needed for anxiety. 03/07/20  Yes Shelly Coss, MD  metoprolol succinate (TOPROL-XL) 50 MG 24 hr tablet Take 1 tablet (50 mg total) by mouth daily. Take with or immediately following a meal. Patient taking differently: Take 50 mg by mouth daily.  12/22/19  Yes Fay Records, MD  Multiple Vitamins-Minerals (ADULT ONE DAILY GUMMIES) CHEW Chew 1 capsule by mouth daily.   Yes [provider]  NARCAN 4 MG/0.1ML LIQD nasal spray kit Place 1 spray into the nose as needed (opioid overdose).  11/25/19  Yes [provider]  oxyCODONE (ROXICODONE) 15 MG immediate release tablet Take 15 mg by mouth every 6 (six) hours as needed for pain.   Yes [provider]  potassium chloride (K-DUR) 10 MEQ tablet Take 10 mEq by mouth daily as needed (For cramps).    Yes [provider]  ceFEPime (MAXIPIME) IVPB Inject 2 g into the vein every 12 (twelve) hours for 16 days. Indication:  Endocarditis  First Dose: No Last Day of Therapy:  03/21/2020 Labs - Once weekly:  CBC/D and BMP, Labs - Every other week:  ESR and CRP Method of administration: IV Push Method of administration may be changed at the discretion of home infusion pharmacist based upon assessment of the patient and/or caregiver's ability to self-administer the medication ordered. 03/07/20 03/23/20  Shelly Coss, MD    Allergies    Tramadol, Nsaids, Tolmetin, Tylenol [acetaminophen], and Aspirin  Review of Systems   Review of Systems  Constitutional: Negative for chills and fever.  HENT: Negative.   Eyes: Negative for visual disturbance.  Respiratory: Positive for shortness of breath. Negative for cough.   Cardiovascular: Positive for chest pain and leg swelling.  Gastrointestinal: Positive for abdominal distention and abdominal pain. Negative for nausea and vomiting.  Musculoskeletal: Positive for myalgias. Negative for arthralgias.  Skin: Positive for wound.  Neurological:  Positive for weakness.  Psychiatric/Behavioral: Positive for confusion and hallucinations.  All other systems reviewed and are negative.   Physical Exam Updated Vital Signs BP (!) 178/108   Pulse (!) 117   Temp 99.6 F (37.6 C) (Oral)   Resp (!) 30   Ht _0  (1.676 m)   Wt 87.1 kg   SpO2 99%   BMI 30.99 kg/m   Physical Exam Vitals and nursing note reviewed.  Constitutional:      General: She is not in acute distress.    Appearance: Normal appearance. She is well-developed. She is ill-appearing. She is not diaphoretic.     Comments: Patient arrives ill-appearing, and is intermittently tearful and agitated, somewhat confused  HENT:     Head: Normocephalic and atraumatic.     Nose: Nose normal.     Mouth/Throat:     Mouth: Mucous membranes are dry.  Eyes:     General:        Right eye: No discharge.        Left eye: No discharge.     Pupils: Pupils are equal, round, and reactive to light.  Cardiovascular:     Rate and Rhythm: Regular rhythm. Tachycardia present.     Heart sounds: Normal heart sounds. No murmur heard.  No friction rub. No gallop.   Pulmonary:     Effort: Tachypnea present. No respiratory distress.     Breath sounds: Decreased breath sounds present. No wheezing or rales.     Comments: Patient is tachypneic with some increased work of breathing, but satting well on room air, able to speak in full sentences, on auscultation patient with some decreased breath sounds bilaterally, no focal wheezes, rales or rhonchi noted Chest:     Chest wall: No tenderness.  Abdominal:     General: Bowel sounds are normal. There is distension.     Palpations: Abdomen is soft. There is no mass.     Tenderness: There is abdominal tenderness. There is no guarding.     Hernia: No hernia is present.     Comments: Abdomen is distended but soft, and appears swollen, bowel sounds present, patient has some generalized tenderness that is mild, no guarding or peritoneal signs or focal  pain, no masses or hernia noted  Musculoskeletal:  General: No deformity.     Cervical back: Neck supple.     Comments: Patient with lower extremity edema worse on the right than left.  She has a chronic appearing wound to the top of the left foot, without surrounding signs of cellulitis or drainage, multiple small scabbed wounds over the lower extremities and surgical scars.  Skin:    General: Skin is warm and dry.     Capillary Refill: Capillary refill takes less than 2 seconds.  Neurological:     Mental Status: She is alert.     Coordination: Coordination normal.     Comments: Speech is clear, able to follow commands No facial droop, cranial nerves grossly intact Moving all extremities, normal strength noted Sensation intact in all extremities Moves extremities without ataxia, coordination intact  Psychiatric:        Attention and Perception: She perceives visual hallucinations.        Mood and Affect: Affect is labile.        Behavior: Behavior is agitated.        Thought Content: Thought content does not include homicidal or suicidal ideation.     ED Results / Procedures / Treatments   Labs (all labs ordered are listed, but only abnormal results are displayed) Labs Reviewed  COMPREHENSIVE METABOLIC PANEL - Abnormal; Notable for the following components:      Result Value   Potassium 3.4 (*)    Glucose, Bld 100 (*)    Calcium 8.4 (*)    Albumin 2.4 (*)    Alkaline Phosphatase 203 (*)    All other components within normal limits  CBC WITH DIFFERENTIAL/PLATELET - Abnormal; Notable for the following components:   Hemoglobin 10.7 (*)    HCT 35.0 (*)    MCV 79.2 (*)    MCH 24.2 (*)    RDW 20.3 (*)    nRBC 0.9 (*)    All other components within normal limits  PROTIME-INR - Abnormal; Notable for the following components:   Prothrombin Time 16.7 (*)    INR 1.4 (*)    All other components within normal limits  URINALYSIS, ROUTINE W REFLEX MICROSCOPIC - Abnormal;  Notable for the following components:   Color, Urine STRAW (*)    Hgb urine dipstick SMALL (*)    Protein, ur 100 (*)    Bacteria, UA RARE (*)    All other components within normal limits  BRAIN NATRIURETIC PEPTIDE - Abnormal; Notable for the following components:   B Natriuretic Peptide 1,352.8 (*)    All other components within normal limits  I-STAT CHEM 8, ED - Abnormal; Notable for the following components:   Potassium 3.4 (*)    Calcium, Ion 1.11 (*)    All other components within normal limits  TROPONIN I (HIGH SENSITIVITY) - Abnormal; Notable for the following components:   Troponin I (High Sensitivity) 73 (*)    All other components within normal limits  TROPONIN I (HIGH SENSITIVITY) - Abnormal; Notable for the following components:   Troponin I (High Sensitivity) 69 (*)    All other components within normal limits  RESP PANEL BY RT-PCR (FLU A&B, COVID) ARPGX2  URINE CULTURE  CULTURE, BLOOD (ROUTINE X 2)  CULTURE, BLOOD (ROUTINE X 2)  LACTIC ACID, PLASMA  APTT    EKG EKG Interpretation  Date/Time:  Sunday March 21 2020 06:46:14 EST Ventricular Rate:  116 PR Interval:    QRS Duration: 79 QT Interval:  371 QTC Calculation: 518 R Axis:  77 Text Interpretation: Sinus tachycardia Ventricular premature complex Aberrant conduction of SV complex(es) Left atrial enlargement Low voltage with right axis deviation Probable anteroseptal infarct, old Prolonged QT interval No significant change since last tracing Confirmed by Deno Etienne [phone]) on 03/21/2020 7:40:03 AM   Radiology CT Head Wo Contrast  Result Date: 03/21/2020 CLINICAL DATA:  59 year old female with altered mental status. Recent RIGHT occipital infarct. EXAM: CT HEAD WITHOUT CONTRAST TECHNIQUE: Contiguous axial images were obtained from the base of the skull through the vertex without intravenous contrast. COMPARISON:  02/29/2020 head CT and prior studies FINDINGS: Brain: A RIGHT occipital infarct appears  unchanged from 02/29/2020. No new infarct, midline shift, hydrocephalus, extra-axial collection, hemorrhage or mass lesion identified. A remote LEFT occipital infarct and mild chronic small-vessel white matter ischemic changes are again noted. Vascular: Carotid atherosclerotic calcifications are noted. Skull: Normal. Negative for fracture or focal lesion. Sinuses/Orbits: No acute finding. Other: None. IMPRESSION: 1. No evidence of acute intracranial abnormality. 2. Unchanged RIGHT occipital infarct.  No evidence of hemorrhage. 3. Mild chronic small-vessel white matter ischemic changes and remote LEFT occipital infarct. Electronically Signed   By: Margarette Canada M.D.   On: 03/21/2020 11:58   CT CHEST WO CONTRAST  Result Date: 03/21/2020 CLINICAL DATA:  Swelling. Wounds in bilateral lower extremities. Left arm appears swollen. Infection in left foot. Abdominal pain. Abdominal distention. EXAM: CT CHEST, ABDOMEN, AND PELVIS WITH CONTRAST TECHNIQUE: Multidetector CT imaging of the chest, abdomen and pelvis was performed following the standard protocol during bolus administration of intravenous contrast. CONTRAST:  151m OMNIPAQUE IOHEXOL 300 MG/ML  SOLN COMPARISON:  CT of the chest, abdomen, and pelvis February 24, 2020 FINDINGS: CT CHEST FINDINGS Cardiovascular: Three-vessel calcified atherosclerosis identified. Persistent cardiomegaly. Mild atherosclerosis in the thoracic aorta. The thoracic aorta is nonaneurysmal with no dissection. Central pulmonary arteries are normal in caliber. No obvious central filling defects. Mediastinum/Nodes: The patient is status post right mastectomy. The chest wall is otherwise normal. The thyroid and esophagus are normal. There is a small left pleural effusion, similar in the interval. There is a small to moderate right pleural effusion which is probably a little larger in the interval. No pericardial effusion. Mediastinal adenopathy again identified and not significantly changed. A  right paratracheal node is unchanged measuring 2.9 by 1.8 cm on both studies. Other enlarged mediastinal are without significant change. An enlarged right supraclavicular node remains measuring 3.3 by 1.2 cm on today's study and the previous study. Shotty nodes in the bilateral axilla are stable. A left PICC line terminates in the SVC below the brachiocephalic confluence. Lungs/Pleura: Central airways are normal. No pneumothorax. Interlobular septal thickening consistent with mild edema. Right greater than left pleural effusions with associated atelectasis. No new infiltrates to suggest pneumonia. No pulmonary nodules or masses are identified. Musculoskeletal: Degenerative changes in the thoracic spine. No acute bony abnormalities. CT ABDOMEN PELVIS FINDINGS Hepatobiliary: No focal liver abnormality is seen. No gallstones, gallbladder wall thickening, or biliary dilatation. Pancreas: Unremarkable. No pancreatic ductal dilatation or surrounding inflammatory changes. Spleen: Normal in size without focal abnormality. Adrenals/Urinary Tract: Adrenal glands are unremarkable. Kidneys are normal, without renal calculi, focal lesion, or hydronephrosis. Bladder is unremarkable. Stomach/Bowel: Stomach is within normal limits. Appendix appears normal. No evidence of bowel wall thickening, distention, or inflammatory changes. Vascular/Lymphatic: Mild calcified atherosclerosis in the inferior abdominal aorta, extending into the iliac vessels. No definite adenopathy in the abdomen or pelvis. Reproductive: Status post hysterectomy. No adnexal masses. Other: Free fluid in the pelvis. Significant  edema in the subcutaneous fat diffusely. Skin thickening, particularly dependently, likely due to edema. No free air. Musculoskeletal: Degenerative changes in the left greater than right hip. Degenerative changes in the lumbar spine. IMPRESSION: 1. Free fluid in the pelvis, pleural effusions, edema in the subcutaneous fat, and skin  thickening are all likely due to volume overload. 2. Pulmonary venous congestion/mild pulmonary edema. 3. Adenopathy in the right supraclavicular region and mediastinum, unchanged since October of 2020. The adenopathy could be reactive, metastatic, or due to lymphoma. Recommend clinical correlation and attention on follow-up. 4. Atherosclerosis in the thoracic and abdominal aorta. Three-vessel coronary artery disease. 5. No other significant abnormalities. Electronically Signed   By: Dorise Bullion III M.D   On: 03/21/2020 12:24   CT ABDOMEN PELVIS W CONTRAST  Result Date: 03/21/2020 CLINICAL DATA:  Swelling. Wounds in bilateral lower extremities. Left arm appears swollen. Infection in left foot. Abdominal pain. Abdominal distention. EXAM: CT CHEST, ABDOMEN, AND PELVIS WITH CONTRAST TECHNIQUE: Multidetector CT imaging of the chest, abdomen and pelvis was performed following the standard protocol during bolus administration of intravenous contrast. CONTRAST:  136m OMNIPAQUE IOHEXOL 300 MG/ML  SOLN COMPARISON:  CT of the chest, abdomen, and pelvis February 24, 2020 FINDINGS: CT CHEST FINDINGS Cardiovascular: Three-vessel calcified atherosclerosis identified. Persistent cardiomegaly. Mild atherosclerosis in the thoracic aorta. The thoracic aorta is nonaneurysmal with no dissection. Central pulmonary arteries are normal in caliber. No obvious central filling defects. Mediastinum/Nodes: The patient is status post right mastectomy. The chest wall is otherwise normal. The thyroid and esophagus are normal. There is a small left pleural effusion, similar in the interval. There is a small to moderate right pleural effusion which is probably a little larger in the interval. No pericardial effusion. Mediastinal adenopathy again identified and not significantly changed. A right paratracheal node is unchanged measuring 2.9 by 1.8 cm on both studies. Other enlarged mediastinal are without significant change. An enlarged  right supraclavicular node remains measuring 3.3 by 1.2 cm on today's study and the previous study. Shotty nodes in the bilateral axilla are stable. A left PICC line terminates in the SVC below the brachiocephalic confluence. Lungs/Pleura: Central airways are normal. No pneumothorax. Interlobular septal thickening consistent with mild edema. Right greater than left pleural effusions with associated atelectasis. No new infiltrates to suggest pneumonia. No pulmonary nodules or masses are identified. Musculoskeletal: Degenerative changes in the thoracic spine. No acute bony abnormalities. CT ABDOMEN PELVIS FINDINGS Hepatobiliary: No focal liver abnormality is seen. No gallstones, gallbladder wall thickening, or biliary dilatation. Pancreas: Unremarkable. No pancreatic ductal dilatation or surrounding inflammatory changes. Spleen: Normal in size without focal abnormality. Adrenals/Urinary Tract: Adrenal glands are unremarkable. Kidneys are normal, without renal calculi, focal lesion, or hydronephrosis. Bladder is unremarkable. Stomach/Bowel: Stomach is within normal limits. Appendix appears normal. No evidence of bowel wall thickening, distention, or inflammatory changes. Vascular/Lymphatic: Mild calcified atherosclerosis in the inferior abdominal aorta, extending into the iliac vessels. No definite adenopathy in the abdomen or pelvis. Reproductive: Status post hysterectomy. No adnexal masses. Other: Free fluid in the pelvis. Significant edema in the subcutaneous fat diffusely. Skin thickening, particularly dependently, likely due to edema. No free air. Musculoskeletal: Degenerative changes in the left greater than right hip. Degenerative changes in the lumbar spine. IMPRESSION: 1. Free fluid in the pelvis, pleural effusions, edema in the subcutaneous fat, and skin thickening are all likely due to volume overload. 2. Pulmonary venous congestion/mild pulmonary edema. 3. Adenopathy in the right supraclavicular region and  mediastinum, unchanged since October  of 2020. The adenopathy could be reactive, metastatic, or due to lymphoma. Recommend clinical correlation and attention on follow-up. 4. Atherosclerosis in the thoracic and abdominal aorta. Three-vessel coronary artery disease. 5. No other significant abnormalities. Electronically Signed   By: Dorise Bullion III M.D   On: 03/21/2020 12:24   DG Chest Port 1 View  Result Date: 03/21/2020 CLINICAL DATA:  Sepsis and shortness of breath. EXAM: PORTABLE CHEST 1 VIEW COMPARISON:  03/11/2020 and prior radiographs FINDINGS: Cardiomegaly and pulmonary vascular congestion again noted. Bilateral airspace opacities have decreased from 03/11/2020. There is no evidence of pneumothorax or large pleural effusion. A LEFT-sided PICC line is again identified. IMPRESSION: Decreased bilateral airspace which may represent pneumonia and/or edema. No other significant change. Electronically Signed   By: Margarette Canada M.D.   On: 03/21/2020 08:04    Procedures .Critical Care Performed by: Jacqlyn Larsen, PA-C Authorized by: Jacqlyn Larsen, PA-C   Critical care provider statement:    Critical care time (minutes):  45   Critical care was necessary to treat or prevent imminent or life-threatening deterioration of the following conditions:  Cardiac failure and respiratory failure (CHF exacerbation with significant volume overload, delirium)   Critical care was time spent personally by me on the following activities:  Discussions with consultants, evaluation of patient's response to treatment, examination of patient, ordering and performing treatments and interventions, ordering and review of laboratory studies, ordering and review of radiographic studies, pulse oximetry, re-evaluation of patient's condition, obtaining history from patient or surrogate and review of old charts   (including critical care time)  Medications Ordered in ED Medications  sodium chloride (PF) 0.9 % injection (has  no administration in time range)  sodium chloride flush (NS) 0.9 % injection 10-40 mL (has no administration in time range)  Chlorhexidine Gluconate Cloth 2 % PADS 6 each (6 each Topical Given 03/21/20 1321)  alteplase (CATHFLO ACTIVASE) injection 2 mg (2 mg Intracatheter Given 03/21/20 0949)  alteplase (CATHFLO ACTIVASE) injection 2 mg (2 mg Intracatheter Given 03/21/20 0949)  furosemide (LASIX) injection 40 mg (40 mg Intravenous Given 03/21/20 0954)  iohexol (OMNIPAQUE) 300 MG/ML solution 100 mL (100 mLs Intravenous Contrast Given 03/21/20 1110)  OLANZapine (ZYPREXA) injection 10 mg (10 mg Intramuscular Given 03/21/20 1306)  sterile water (preservative free) injection (  Given 03/21/20 1319)    ED Course  I have reviewed the triage vital signs and the nursing notes.  Pertinent labs & imaging results that were available during my care of the patient were reviewed by me and considered in my medical decision making (see chart for details).    MDM Rules/Calculators/A&P                         59 year old female arrives via EMS, she was just discharged from the hospital on 11/18 after a long admission for septic shock due to gram-negative bacteremia, during this hospital admission she was also found to have an acute stroke, and developed delirium, delirium had improved at time of discharge per discharge summary. Patient called EMS because she thought that there were multiple people in her house, daughter reports that for the past 2 days she has been having hallucinations and has been confused and increasingly agitated. Daughter also reports that she has had significantly worsened edema since returning home with swelling in her arms legs and abdomen. On arrival patient is tachycardic and tachypneic, but not requiring oxygen at this time. On lung exam she  has some decreased air movement no focal wheezes or rhonchi. Abdomen is distended but soft, patient does endorse some mild upper abdominal  tenderness. Patient is intermittently confused, and mood is very labile, concern for returning of delirium in the setting of acute illness. Patient completed course of IV antibiotics at home through PICC line. She has a wound on the left foot which was thought to be the prior source of her infection, this appears chronic without surrounding swelling, erythema or drainage. Will initiate broad work-up for potential infection versus fluid overload as patient appears diffusely edematous. We will also get head CT, given recent stroke and confusion and hallucinations to rule out hemorrhagic conversion of stroke. Given abdominal distention and upper abdominal pain will also get CT abdomen pelvis. Given the patient is not febrile or hypotensive we will hold off on initiating antibiotics and would like to avoid IV fluids given concern for severe fluid overload.  I have independently ordered, reviewed and interpreted all labs and imaging: CBC without leukocytosis, stable hemoglobin CMP: Mild hypokalemia of 3.4, no other significant electrolyte derangements, alk phos of 203 which is at baseline but no other derangements in LFTs, normal renal function Lactic acid: Not elevated Troponin: Mildly elevated at 73, delta troponin of 69, both of these are downtrending when compared to patient's recent hospitalization BNP: Elevated at 1352.8 which is consistent with fluid overload picture on exam Urinalysis: No signs of infection. Covid/flu: Negative Blood cultures: Pending  Chest x-ray with bilateral airspace disease which may be pneumonia versus edema, clinical picture favors edema without current fevers, cough and diffuse fluid overload noted.  CT of the head shows no acute abnormalities, unchanged right occipital infarct with no hemorrhagic conversion.  Called from CT as they were doing abdominal scan, patient noted to have large right-sided pleural effusion, I asked them to scan through the chest as well.  CT  chest abdomen pelvis significant for pulmonary vascular congestion with large pleural effusion there is also some free fluid in the pelvis and edema in the subcutaneous fat consistent with fluid overload picture. No other significant abnormalities noted within the abdomen or chest.  Patient started on Lasix.  Called by nursing staff as patient has become increasingly agitated, she has ripped off all of her leads multiple times and was found standing in the hallway. Patient trying to leave. Given her confusion, hallucinations and concern for delirium, I do not feel that she has capacity to make decision to leave on her own I have tried multiple times to explain medical concerns and necessity for her to stay for further evaluation but patient continues to scream and will not listen or participate in conversation. I called and spoke with her daughter [name] who agrees that patient needs to stay for admission and is concerned given her confusion that she exhibited at home that she does not have capacity to make this decision for herself. Agrees with plan for involuntary commitment and admission for continued medical therapy. Was given Zyprexa during previous hospital admission for delirium, will give 10 of IM Zyprexa here in the ED. IVC normal first exam paperwork completed  After patient received Zyprexa she is more calm and states she is willing to stay. I called and spoke with Dr. Marylyn Ishihara with [facility] who will see and admit the patient.  Final Clinical Impression(s) / ED Diagnoses Final diagnoses:  Hypervolemia, unspecified hypervolemia type  Acute on chronic congestive heart failure, unspecified heart failure type ([place])  Delirium  Rx / DC Orders ED Discharge Orders    None       Janet Berlin 03/21/20 [place], DO 03/21/20 1503

## 2020-03-21 NOTE — ED Triage Notes (Signed)
Pt came in from home via EMS with many complaints. Pt is swollen and has wounds bilateral legs and L arm. Pt has a PICC line to L arm and that arm appears swollen. Pt states it is sore. Pt has infection to L foot which in which she was just discharged from the hospital. Pt is having hallucinations about a man being in her home for two days.

## 2020-03-21 NOTE — ED Notes (Signed)
Report called to RN Tess

## 2020-03-21 NOTE — ED Notes (Signed)
Patient was wiped down with chlorhexidine wipes. Patient was then covered up and her call bell was placed within reach.

## 2020-03-21 NOTE — ED Notes (Signed)
Called to give report, nurse in covid rm and will call back.

## 2020-03-21 NOTE — ED Notes (Signed)
Pt has sitter at bedside. Respirations even and unlabored.

## 2020-03-21 NOTE — H&P (Addendum)
History and Physical    Morgan Beasley XBW:[id number] DOB: 1960/05/05 DOA: 03/21/2020  PCP: Riki Sheer, NP  Patient coming from: Home  Chief Complaint: confusion  HPI: Morgan Beasley is a 59 y.o. female with medical history significant of CVA, COPD, DM2, diastolic HF. Presenting with 2 days of confusion. She was recently discharged from the hospital for CVA and septic shock d/t gram negative bacteremia. She was on cefepime through today. Per her daughter's report, the patient has been confused the entire time she has been home. She has been hallucinating and agitated. She has been uncontrollable there, so she sent her back to the ED.   ED Course: She has been agitated. ED has given her zyprexa. She was noted to have anasarca. She was given lasix. TRH was called for admission.   Review of Systems:  Reports BLE leg pain. Denies CP, palpitations, N/V/ab pain. Review of systems is otherwise negative for all not mentioned in HPI.   PMHx Past Medical History:  Diagnosis Date  . Alcohol dependence ([place])   . Anemia   . Anxiety   . Breast cancer ([place])   . Chronic combined systolic and diastolic CHF (congestive heart failure) ([place])   . Cigarette nicotine dependence   . CKD (chronic kidney disease), stage III ([place])   . Colon polyps   . COPD (chronic obstructive pulmonary disease) ([place])   . Diabetes mellitus without complication ([place])   . Diabetic neuropathy ([place])   . Gout   . Hyperlipemia   . Hypertension   . Insomnia   . Lymphedema   . Marijuana use   . Mild CAD 2016   a. NSTEMI 2016 in context of cocaine abuse, 50% RCA% at that time.  . OSA treated with BiPAP   . PAD (peripheral artery disease) ([place])    a. s/p L SFA stenting 08/2019. b. left fem-to-below-knee-popliteal bypass 09/2019.  Marland Kitchen Pancreatitis    acute on chronic due to ETOH initially.    . Sleep apnea    wears BIPAP  . Ulcer of foot ([place])    right    PSHx Past Surgical History:  Procedure Laterality  Date  . ABDOMINAL AORTOGRAM W/LOWER EXTREMITY N/A 09/26/2019   Procedure: ABDOMINAL AORTOGRAM W/LOWER EXTREMITY;  Surgeon: Angelia Mould, MD;  Location: [place] [facility];  Service: Cardiovascular;  Laterality: N/A;  . ABDOMINAL AORTOGRAM W/LOWER EXTREMITY Bilateral 12/08/2019   Procedure: ABDOMINAL AORTOGRAM W/LOWER EXTREMITY;  Surgeon: Waynetta Sandy, MD;  Location: [place] [facility];  Service: Cardiovascular;  Laterality: Bilateral;  . ABDOMINAL AORTOGRAM W/LOWER EXTREMITY Left 01/26/2020   Procedure: ABDOMINAL AORTOGRAM W/LOWER EXTREMITY;  Surgeon: Waynetta Sandy, MD;  Location: [place] [facility];  Service: Cardiovascular;  Laterality: Left;  . ABDOMINAL HYSTERECTOMY    . BUBBLE STUDY  03/04/2020   Procedure: BUBBLE STUDY;  Surgeon: Elouise Munroe, MD;  Location[facility] ENDOSCOPY;  Service: Cardiology;;  . [facility]  . FEMORAL-POPLITEAL BYPASS GRAFT Left 10/14/2019   Procedure: BYPASS GRAFT LEFT FEMORAL-POPLITEAL ARTERY USING NONREVERSED SAPHENOUS VEIN;  Surgeon: Waynetta Sandy, MD;  Location: [place];  Service: Vascular;  Laterality: Left;  Marland Kitchen MASTECTOMY Right April 2016  . PERIPHERAL VASCULAR ATHERECTOMY Left 01/26/2020   Procedure: PERIPHERAL VASCULAR ATHERECTOMY;  Surgeon: Waynetta Sandy, MD;  Location: [place] [facility];  Service: Cardiovascular;  Laterality: Left;  PT and AT - [facility]  . PERIPHERAL VASCULAR BALLOON ANGIOPLASTY Left 01/26/2020   Procedure: PERIPHERAL  VASCULAR BALLOON ANGIOPLASTY;  Surgeon: Waynetta Sandy, MD;  Location: [place] [facility];  Service: Cardiovascular;  Laterality: Left;  TP Trunk   . PERIPHERAL VASCULAR INTERVENTION Left 09/26/2019   Procedure: PERIPHERAL VASCULAR INTERVENTION;  Surgeon: Angelia Mould, MD;  Location: [place] [facility];  Service: Cardiovascular;  Laterality: Left;  superficial femoral  . TEE WITHOUT CARDIOVERSION N/A 03/04/2020   Procedure:  TRANSESOPHAGEAL ECHOCARDIOGRAM (TEE);  Surgeon: Elouise Munroe, MD;  Location: [place];  Service: Cardiology;  Laterality: N/A;    SocHx  reports that she has been smoking cigarettes. She has never used smokeless tobacco. She reports previous alcohol use. She reports previous drug use. Drug: Marijuana.  Allergies  Allergen Reactions  . Tramadol Swelling  . Nsaids Other (See Comments)    Pancreatitis  . Tolmetin Other (See Comments)    Pancreatitis  . Tylenol [Acetaminophen] Other (See Comments)    unknown  . Aspirin Other (See Comments)    "Makes my pancreas act up"     FamHx Family History  Problem Relation Age of Onset  . Diabetes Other   . Heart disease Other   . Breast cancer Maternal Grandmother   . Breast cancer Paternal Grandmother   . Stroke Son   . Colon cancer Neg Hx   . Esophageal cancer Neg Hx   . Rectal cancer Neg Hx   . Stomach cancer Neg Hx     Prior to Admission medications   Medication Sig Start Date End Date Taking? Authorizing Provider  albuterol (PROAIR HFA) 108 (90 Base) MCG/ACT inhaler Inhale 2 puffs into the lungs every 6 (six) hours as needed for wheezing or shortness of breath.  05/19/14  Yes [provider]  amitriptyline (ELAVIL) 100 MG tablet Take 100 mg by mouth at bedtime.    Yes [provider]  atorvastatin (LIPITOR) 20 MG tablet Take 1 tablet (20 mg total) by mouth daily. 03/19/20 04/18/20 Yes Arrien, Jimmy Picket, MD  budesonide-formoterol [facility]) 160-4.5 MCG/ACT inhaler Inhale 2 puffs into the lungs 2 (two) times daily.   Yes [provider]  clopidogrel (PLAVIX) 75 MG tablet Take 1 tablet (75 mg total) by mouth daily. 09/27/19 09/26/20 Yes Brimage, Ronnette Juniper, DO  dapagliflozin propanediol (FARXIGA) 10 MG TABS tablet Take 1 tablet (10 mg total) by mouth daily before breakfast. 09/26/19  Yes Angelia Mould, MD  furosemide (LASIX) 40 MG tablet Take 1 tablet (40 mg total) by mouth daily. 03/19/20  04/18/20 Yes Arrien, Jimmy Picket, MD  gabapentin (NEURONTIN) 400 MG capsule Take 1 capsule (400 mg total) by mouth 3 (three) times daily for 30 doses. 03/18/20 03/28/20 Yes Arrien, Jimmy Picket, MD  insulin glargine (LANTUS) 100 UNIT/ML injection Inject 0.4 mLs (40 Units total) into the skin daily. Patient taking differently: Inject 80 Units into the skin daily.  09/27/19  Yes Brimage, Vondra, DO  insulin lispro (HUMALOG) 100 UNIT/ML injection Inject 14-20 Units into the skin 3 (three) times daily before meals.    Yes [provider]  lipase/protease/amylase (CREON) 36000 UNITS CPEP capsule Take 2 capsule by mouth with each meal and 1 capsule by mouth with each snack Patient taking differently: Take 36,000-72,000 Units by mouth See admin instructions. Take 72000 units by mouth with each meal and 36000 units by mouth with each snack 09/17/19  Yes Armbruster, Carlota Raspberry, MD  LORazepam (ATIVAN) 0.5 MG tablet Take 1 tablet (0.5 mg total) by mouth every 6 (six) hours as needed for anxiety. 03/07/20  Yes  Shelly Coss, MD  metoprolol succinate (TOPROL-XL) 50 MG 24 hr tablet Take 1 tablet (50 mg total) by mouth daily. Take with or immediately following a meal. Patient taking differently: Take 50 mg by mouth daily.  12/22/19  Yes Fay Records, MD  Multiple Vitamins-Minerals (ADULT ONE DAILY GUMMIES) CHEW Chew 1 capsule by mouth daily.   Yes [provider]  NARCAN 4 MG/0.1ML LIQD nasal spray kit Place 1 spray into the nose as needed (opioid overdose).  11/25/19  Yes [provider]  oxyCODONE (ROXICODONE) 15 MG immediate release tablet Take 15 mg by mouth every 6 (six) hours as needed for pain.   Yes [provider]  potassium chloride (K-DUR) 10 MEQ tablet Take 10 mEq by mouth daily as needed (For cramps).    Yes [provider]  ceFEPime (MAXIPIME) IVPB Inject 2 g into the vein every 12 (twelve) hours for 16 days. Indication:  Endocarditis  First Dose:  No Last Day of Therapy:  03/21/2020 Labs - Once weekly:  CBC/D and BMP, Labs - Every other week:  ESR and CRP Method of administration: IV Push Method of administration may be changed at the discretion of home infusion pharmacist based upon assessment of the patient and/or caregiver's ability to self-administer the medication ordered. 03/07/20 03/23/20  Shelly Coss, MD    Physical Exam: Vitals:   03/21/20 0756 03/21/20 0858 03/21/20 1058 03/21/20 1303  BP: (!) 159/133 (!) 163/132 (!) 131/106 (!) 154/112  Pulse: (!) 117 (!) 117 (!) 107 (!) 117  Resp: (!) 22 15 (!) 29 (!) 21  Temp:      TempSrc:      SpO2: 98% 99% 97% (!) 87%  Weight:      Height:        General: 59 y.o. female resting in bed agitated Eyes: PERRL, normal sclera ENMT: Nares patent w/o discharge, orophaynx clear, dentition normal, ears w/o discharge/lesions/ulcers Neck: Supple, trachea midline Cardiovascular: tachy, +S1, S2, no m/g/r, equal pulses throughout Respiratory: slight b/l wheeze, decreased at bases, normal WOB on RA GI: BS+, NDNT, obese, no masses noted, no organomegaly noted MSK: BLE ulcerations noted; none are draining or appear actively infected; BLE edema Skin: No rashes, bruises, ulcerations noted Neuro: A&O x name/place only, no focal deficits Psyc: Agitated but cooperative  Labs on Admission: I have personally reviewed following labs and imaging studies  CBC: Recent Labs  Lab 03/15/20 0317 03/16/20 0315 03/21/20 0748 03/21/20 0758  WBC 7.5 8.1 8.9  --   NEUTROABS 5.0 5.0 6.2  --   HGB 9.5* 9.9* 10.7* 12.6  HCT 31.2* 33.4* 35.0* 37.0  MCV 80.8 81.9 79.2*  --   PLT 309 328 382  --    Basic Metabolic Panel: Recent Labs  Lab 03/16/20 0315 03/18/20 0500 03/21/20 0748 03/21/20 0758  NA 142 140 141 142  K 3.6 3.9 3.4* 3.4*  CL 101 100 100 98  CO2 31 32 28  --   GLUCOSE 107* 195* 100* 99  BUN 22* 24* 17 17  CREATININE 1.28* 1.32* 0.96 1.00  CALCIUM 9.1 8.6* 8.4*  --     GFR: Estimated Creatinine Clearance: 68.2 mL/min (by C-G formula based on SCr of 1 mg/dL). Liver Function Tests: Recent Labs  Lab 03/21/20 0748  AST 31  ALT 22  ALKPHOS 203*  BILITOT 0.9  PROT 7.1  ALBUMIN 2.4*   No results for input(s): LIPASE, AMYLASE in the last 168 hours. No results for input(s): AMMONIA in the  last 168 hours. Coagulation Profile: Recent Labs  Lab 03/21/20 0748  INR 1.4*   Cardiac Enzymes: No results for input(s): CKTOTAL, CKMB, CKMBINDEX, TROPONINI in the last 168 hours. BNP (last 3 results) Recent Labs    12/19/19 1546 02/09/20 0825  PROBNP 1,148* 1,185*   HbA1C: No results for input(s): HGBA1C in the last 72 hours. CBG: Recent Labs  Lab 03/17/20 1643 03/17/20 2117 03/18/20 0802 03/18/20 1256 03/18/20 1649  GLUCAP 109* 247* 136* 262* 101*   Lipid Profile: No results for input(s): CHOL, HDL, LDLCALC, TRIG, CHOLHDL, LDLDIRECT in the last 72 hours. Thyroid Function Tests: No results for input(s): TSH, T4TOTAL, FREET4, T3FREE, THYROIDAB in the last 72 hours. Anemia Panel: No results for input(s): VITAMINB12, FOLATE, FERRITIN, TIBC, IRON, RETICCTPCT in the last 72 hours. Urine analysis:    Component Value Date/Time   COLORURINE STRAW (A) 03/21/2020 0730   APPEARANCEUR CLEAR 03/21/2020 0730   LABSPEC 1.006 03/21/2020 0730   PHURINE 8.0 03/21/2020 0730   GLUCOSEU NEGATIVE 03/21/2020 0730   HGBUR SMALL (A) 03/21/2020 0730   BILIRUBINUR NEGATIVE 03/21/2020 0730   KETONESUR NEGATIVE 03/21/2020 0730   PROTEINUR 100 (A) 03/21/2020 0730   NITRITE NEGATIVE 03/21/2020 0730   LEUKOCYTESUR NEGATIVE 03/21/2020 0730    Radiological Exams on Admission: CT Head Wo Contrast  Result Date: 03/21/2020 CLINICAL DATA:  59 year old female with altered mental status. Recent RIGHT occipital infarct. EXAM: CT HEAD WITHOUT CONTRAST TECHNIQUE: Contiguous axial images were obtained from the base of the skull through the vertex without intravenous contrast.  COMPARISON:  02/29/2020 head CT and prior studies FINDINGS: Brain: A RIGHT occipital infarct appears unchanged from 02/29/2020. No new infarct, midline shift, hydrocephalus, extra-axial collection, hemorrhage or mass lesion identified. A remote LEFT occipital infarct and mild chronic small-vessel white matter ischemic changes are again noted. Vascular: Carotid atherosclerotic calcifications are noted. Skull: Normal. Negative for fracture or focal lesion. Sinuses/Orbits: No acute finding. Other: None. IMPRESSION: 1. No evidence of acute intracranial abnormality. 2. Unchanged RIGHT occipital infarct.  No evidence of hemorrhage. 3. Mild chronic small-vessel white matter ischemic changes and remote LEFT occipital infarct. Electronically Signed   By: Margarette Canada M.D.   On: 03/21/2020 11:58   CT CHEST WO CONTRAST  Result Date: 03/21/2020 CLINICAL DATA:  Swelling. Wounds in bilateral lower extremities. Left arm appears swollen. Infection in left foot. Abdominal pain. Abdominal distention. EXAM: CT CHEST, ABDOMEN, AND PELVIS WITH CONTRAST TECHNIQUE: Multidetector CT imaging of the chest, abdomen and pelvis was performed following the standard protocol during bolus administration of intravenous contrast. CONTRAST:  165mL OMNIPAQUE IOHEXOL 300 MG/ML  SOLN COMPARISON:  CT of the chest, abdomen, and pelvis February 24, 2020 FINDINGS: CT CHEST FINDINGS Cardiovascular: Three-vessel calcified atherosclerosis identified. Persistent cardiomegaly. Mild atherosclerosis in the thoracic aorta. The thoracic aorta is nonaneurysmal with no dissection. Central pulmonary arteries are normal in caliber. No obvious central filling defects. Mediastinum/Nodes: The patient is status post right mastectomy. The chest wall is otherwise normal. The thyroid and esophagus are normal. There is a small left pleural effusion, similar in the interval. There is a small to moderate right pleural effusion which is probably a little larger in the interval.  No pericardial effusion. Mediastinal adenopathy again identified and not significantly changed. A right paratracheal node is unchanged measuring 2.9 by 1.8 cm on both studies. Other enlarged mediastinal are without significant change. An enlarged right supraclavicular node remains measuring 3.3 by 1.2 cm on today's study and the previous study. Shotty nodes in the  bilateral axilla are stable. A left PICC line terminates in the SVC below the brachiocephalic confluence. Lungs/Pleura: Central airways are normal. No pneumothorax. Interlobular septal thickening consistent with mild edema. Right greater than left pleural effusions with associated atelectasis. No new infiltrates to suggest pneumonia. No pulmonary nodules or masses are identified. Musculoskeletal: Degenerative changes in the thoracic spine. No acute bony abnormalities. CT ABDOMEN PELVIS FINDINGS Hepatobiliary: No focal liver abnormality is seen. No gallstones, gallbladder wall thickening, or biliary dilatation. Pancreas: Unremarkable. No pancreatic ductal dilatation or surrounding inflammatory changes. Spleen: Normal in size without focal abnormality. Adrenals/Urinary Tract: Adrenal glands are unremarkable. Kidneys are normal, without renal calculi, focal lesion, or hydronephrosis. Bladder is unremarkable. Stomach/Bowel: Stomach is within normal limits. Appendix appears normal. No evidence of bowel wall thickening, distention, or inflammatory changes. Vascular/Lymphatic: Mild calcified atherosclerosis in the inferior abdominal aorta, extending into the iliac vessels. No definite adenopathy in the abdomen or pelvis. Reproductive: Status post hysterectomy. No adnexal masses. Other: Free fluid in the pelvis. Significant edema in the subcutaneous fat diffusely. Skin thickening, particularly dependently, likely due to edema. No free air. Musculoskeletal: Degenerative changes in the left greater than right hip. Degenerative changes in the lumbar spine.  IMPRESSION: 1. Free fluid in the pelvis, pleural effusions, edema in the subcutaneous fat, and skin thickening are all likely due to volume overload. 2. Pulmonary venous congestion/mild pulmonary edema. 3. Adenopathy in the right supraclavicular region and mediastinum, unchanged since October of 2020. The adenopathy could be reactive, metastatic, or due to lymphoma. Recommend clinical correlation and attention on follow-up. 4. Atherosclerosis in the thoracic and abdominal aorta. Three-vessel coronary artery disease. 5. No other significant abnormalities. Electronically Signed   By: Dorise Bullion III M.D   On: 03/21/2020 12:24   CT ABDOMEN PELVIS W CONTRAST  Result Date: 03/21/2020 CLINICAL DATA:  Swelling. Wounds in bilateral lower extremities. Left arm appears swollen. Infection in left foot. Abdominal pain. Abdominal distention. EXAM: CT CHEST, ABDOMEN, AND PELVIS WITH CONTRAST TECHNIQUE: Multidetector CT imaging of the chest, abdomen and pelvis was performed following the standard protocol during bolus administration of intravenous contrast. CONTRAST:  160m OMNIPAQUE IOHEXOL 300 MG/ML  SOLN COMPARISON:  CT of the chest, abdomen, and pelvis February 24, 2020 FINDINGS: CT CHEST FINDINGS Cardiovascular: Three-vessel calcified atherosclerosis identified. Persistent cardiomegaly. Mild atherosclerosis in the thoracic aorta. The thoracic aorta is nonaneurysmal with no dissection. Central pulmonary arteries are normal in caliber. No obvious central filling defects. Mediastinum/Nodes: The patient is status post right mastectomy. The chest wall is otherwise normal. The thyroid and esophagus are normal. There is a small left pleural effusion, similar in the interval. There is a small to moderate right pleural effusion which is probably a little larger in the interval. No pericardial effusion. Mediastinal adenopathy again identified and not significantly changed. A right paratracheal node is unchanged measuring 2.9 by  1.8 cm on both studies. Other enlarged mediastinal are without significant change. An enlarged right supraclavicular node remains measuring 3.3 by 1.2 cm on today's study and the previous study. Shotty nodes in the bilateral axilla are stable. A left PICC line terminates in the SVC below the brachiocephalic confluence. Lungs/Pleura: Central airways are normal. No pneumothorax. Interlobular septal thickening consistent with mild edema. Right greater than left pleural effusions with associated atelectasis. No new infiltrates to suggest pneumonia. No pulmonary nodules or masses are identified. Musculoskeletal: Degenerative changes in the thoracic spine. No acute bony abnormalities. CT ABDOMEN PELVIS FINDINGS Hepatobiliary: No focal liver abnormality is seen. No gallstones,  gallbladder wall thickening, or biliary dilatation. Pancreas: Unremarkable. No pancreatic ductal dilatation or surrounding inflammatory changes. Spleen: Normal in size without focal abnormality. Adrenals/Urinary Tract: Adrenal glands are unremarkable. Kidneys are normal, without renal calculi, focal lesion, or hydronephrosis. Bladder is unremarkable. Stomach/Bowel: Stomach is within normal limits. Appendix appears normal. No evidence of bowel wall thickening, distention, or inflammatory changes. Vascular/Lymphatic: Mild calcified atherosclerosis in the inferior abdominal aorta, extending into the iliac vessels. No definite adenopathy in the abdomen or pelvis. Reproductive: Status post hysterectomy. No adnexal masses. Other: Free fluid in the pelvis. Significant edema in the subcutaneous fat diffusely. Skin thickening, particularly dependently, likely due to edema. No free air. Musculoskeletal: Degenerative changes in the left greater than right hip. Degenerative changes in the lumbar spine. IMPRESSION: 1. Free fluid in the pelvis, pleural effusions, edema in the subcutaneous fat, and skin thickening are all likely due to volume overload. 2. Pulmonary  venous congestion/mild pulmonary edema. 3. Adenopathy in the right supraclavicular region and mediastinum, unchanged since October of 2020. The adenopathy could be reactive, metastatic, or due to lymphoma. Recommend clinical correlation and attention on follow-up. 4. Atherosclerosis in the thoracic and abdominal aorta. Three-vessel coronary artery disease. 5. No other significant abnormalities. Electronically Signed   By: Dorise Bullion III M.D   On: 03/21/2020 12:24   DG Chest Port 1 View  Result Date: 03/21/2020 CLINICAL DATA:  Sepsis and shortness of breath. EXAM: PORTABLE CHEST 1 VIEW COMPARISON:  03/11/2020 and prior radiographs FINDINGS: Cardiomegaly and pulmonary vascular congestion again noted. Bilateral airspace opacities have decreased from 03/11/2020. There is no evidence of pneumothorax or large pleural effusion. A LEFT-sided PICC line is again identified. IMPRESSION: Decreased bilateral airspace which may represent pneumonia and/or edema. No other significant change. Electronically Signed   By: Margarette Canada M.D.   On: 03/21/2020 08:04    EKG: Independently reviewed. Sinus tach, prolonged Qtc, no St changes  Assessment/Plan Delirium     - admit to inpt, tele     - continue amatriptyline     - 1-to-1 sitter     - consult psyc  Serratia bacteremia     - was suppose to complete abx today     - spoke with pharmacy; her renal function is good for cefepime 2g q8h; will start this for 24hrs  Anasarca Acute on chronic combined HF     - given lasix in ED     - will continue IV lasix 43m     - Trp 73 -> 69; denies CP     - place on telemetry     - daily wts, I&O  CKD3a     - renal function is ok; follow  DM2     - SSI, DM diet, glucose checks  HLD     - continue statin  Recent CVA     - continue plavix, statin     - no new focal deficits  DVT prophylaxis: SCDs  Code Status: FULL  Family Communication: With dtr by phone  Consults called: None  Status is:  Inpatient  Remains inpatient appropriate because:Altered mental status   Dispo: The patient is from: Home              Anticipated d/c is to: SNF              Anticipated d/c date is: 3 days              Patient currently is not medically stable to d/c.  Jonnie Finner DO [facility]  If 7PM-7AM, please contact night-coverage [email or web address]  03/21/2020, 1:28 PM

## 2020-03-21 NOTE — ED Notes (Signed)
Pt has been confused today, continues to try to get out of bed and leave. PA notified. Pt has been yelling and uncooperative.

## 2020-03-21 NOTE — ED Provider Notes (Signed)
Procedure note: Ultrasound Guided Peripheral IV Ultrasound guided peripheral 1.88 inch angiocath IV placement performed by me. Indications: Nursing unable to place IV. Details: The antecubital fossa and upper arm were evaluated with a multifrequency linear probe. Patent brachial veins were noted. 1 attempt was made to cannulate a vein under realtime US guidance with successful cannulation of the vein and catheter placement. There is return of non-pulsatile dark red blood. The patient tolerated the procedure well without complications. Images archived electronically.  CPT codes: [phone] and [place], [place], DO 03/21/20 1533

## 2020-03-22 ENCOUNTER — Inpatient Hospital Stay (HOSPITAL_COMMUNITY): Payer: Medicare Other

## 2020-03-22 DIAGNOSIS — J810 Acute pulmonary edema: Secondary | ICD-10-CM

## 2020-03-22 DIAGNOSIS — J9601 Acute respiratory failure with hypoxia: Secondary | ICD-10-CM

## 2020-03-22 DIAGNOSIS — G9340 Encephalopathy, unspecified: Secondary | ICD-10-CM

## 2020-03-22 DIAGNOSIS — J90 Pleural effusion, not elsewhere classified: Secondary | ICD-10-CM

## 2020-03-22 LAB — COMPREHENSIVE METABOLIC PANEL
ALT: 22 U/L (ref 0–44)
AST: 31 U/L (ref 15–41)
Albumin: 2.4 g/dL — ABNORMAL LOW (ref 3.5–5.0)
Alkaline Phosphatase: 188 U/L — ABNORMAL HIGH (ref 38–126)
Anion gap: 12 (ref 5–15)
BUN: 16 mg/dL (ref 6–20)
CO2: 28 mmol/L (ref 22–32)
Calcium: 8.3 mg/dL — ABNORMAL LOW (ref 8.9–10.3)
Chloride: 99 mmol/L (ref 98–111)
Creatinine, Ser: 1.08 mg/dL — ABNORMAL HIGH (ref 0.44–1.00)
GFR, Estimated: 60 mL/min — ABNORMAL LOW (ref >60)
Glucose, Bld: 123 mg/dL — ABNORMAL HIGH (ref 70–99)
Potassium: 3.5 mmol/L (ref 3.5–5.1)
Sodium: 139 mmol/L (ref 135–145)
Total Bilirubin: 0.7 mg/dL (ref 0.3–1.2)
Total Protein: 6.9 g/dL (ref 6.5–8.1)

## 2020-03-22 LAB — BLOOD GAS, ARTERIAL
Acid-Base Excess: 7.1 mmol/L — ABNORMAL HIGH (ref 0.0–2.0)
Bicarbonate: 32.2 mmol/L — ABNORMAL HIGH (ref 20.0–28.0)
Drawn by: 50560
FIO2: 28
O2 Content: 2 L/min
O2 Saturation: 49.4 %
Patient temperature: 98.6
pCO2 arterial: 50.9 mmHg — ABNORMAL HIGH (ref 32.0–48.0)
pH, Arterial: 7.417 (ref 7.350–7.450)
pO2, Arterial: 32.1 mmHg — CL (ref 83.0–108.0)

## 2020-03-22 LAB — GLUCOSE, CAPILLARY
Glucose-Capillary: 109 mg/dL — ABNORMAL HIGH (ref 70–99)
Glucose-Capillary: 120 mg/dL — ABNORMAL HIGH (ref 70–99)
Glucose-Capillary: 105 mg/dL — ABNORMAL HIGH (ref 70–99)
Glucose-Capillary: 119 mg/dL — ABNORMAL HIGH (ref 70–99)
Glucose-Capillary: 73 mg/dL (ref 70–99)

## 2020-03-22 LAB — CBC
HCT: 32 % — ABNORMAL LOW (ref 36.0–46.0)
Hemoglobin: 9.6 g/dL — ABNORMAL LOW (ref 12.0–15.0)
MCH: 24.3 pg — ABNORMAL LOW (ref 26.0–34.0)
MCHC: 30 g/dL (ref 30.0–36.0)
MCV: 81 fL (ref 80.0–100.0)
Platelets: 364 10*3/uL (ref 150–400)
RBC: 3.95 MIL/uL (ref 3.87–5.11)
RDW: 20.4 % — ABNORMAL HIGH (ref 11.5–15.5)
WBC: 8.7 10*3/uL (ref 4.0–10.5)
nRBC: 1.3 % — ABNORMAL HIGH (ref 0.0–0.2)

## 2020-03-22 LAB — VITAMIN B12: Vitamin B-12: 916 pg/mL — ABNORMAL HIGH (ref 180–914)

## 2020-03-22 LAB — AMMONIA: Ammonia: 26 umol/L (ref 9–35)

## 2020-03-22 LAB — HIV ANTIBODY (ROUTINE TESTING W REFLEX): HIV Screen 4th Generation wRfx: NONREACTIVE

## 2020-03-22 LAB — MRSA PCR SCREENING: MRSA by PCR: NEGATIVE

## 2020-03-22 LAB — TSH: TSH: 3.728 u[IU]/mL (ref 0.350–4.500)

## 2020-03-22 MED ORDER — CARBAMAZEPINE 100 MG PO CHEW
200.0000 mg | CHEWABLE_TABLET | Freq: Two times a day (BID) | ORAL | Status: DC
  Administered 2020-03-22 – 2020-03-31 (×14): 200 mg via ORAL
  Filled 2020-03-22 (×20): qty 2

## 2020-03-22 MED ORDER — BUSPIRONE HCL 5 MG PO TABS
10.0000 mg | ORAL_TABLET | Freq: Two times a day (BID) | ORAL | Status: DC
  Administered 2020-03-22 – 2020-03-28 (×12): 10 mg via ORAL
  Filled 2020-03-22: qty 1
  Filled 2020-03-22: qty 2
  Filled 2020-03-22: qty 1
  Filled 2020-03-22 (×3): qty 2
  Filled 2020-03-22: qty 1
  Filled 2020-03-22: qty 2
  Filled 2020-03-22 (×2): qty 1
  Filled 2020-03-22 (×2): qty 2

## 2020-03-22 MED ORDER — HALOPERIDOL LACTATE 5 MG/ML IJ SOLN: 5.0000 mg | Freq: Once | INTRAMUSCULAR | Status: DC

## 2020-03-22 MED ORDER — LORAZEPAM 2 MG/ML IJ SOLN
1.0000 mg | Freq: Once | INTRAMUSCULAR | Status: AC
  Administered 2020-03-22: 1 mg via INTRAVENOUS
  Filled 2020-03-22: qty 1

## 2020-03-22 MED ORDER — COLLAGENASE 250 UNIT/GM EX OINT
TOPICAL_OINTMENT | Freq: Every day | CUTANEOUS | Status: DC
  Administered 2020-03-25: 1 via TOPICAL
  Filled 2020-03-22 (×3): qty 30

## 2020-03-22 MED ORDER — CARBAMAZEPINE 100 MG PO CHEW
100.0000 mg | CHEWABLE_TABLET | Freq: Two times a day (BID) | ORAL | Status: DC
  Filled 2020-03-22 (×2): qty 1

## 2020-03-22 MED ORDER — CHLORHEXIDINE GLUCONATE 0.12 % MT SOLN
15.0000 mL | Freq: Two times a day (BID) | OROMUCOSAL | Status: DC
  Administered 2020-03-22 – 2020-04-27 (×33): 15 mL via OROMUCOSAL
  Filled 2020-03-22 (×47): qty 15

## 2020-03-22 MED ORDER — ZIPRASIDONE MESYLATE 20 MG IM SOLR: 10.0000 mg | Freq: Once | INTRAMUSCULAR | Status: DC

## 2020-03-22 MED ORDER — ZIPRASIDONE MESYLATE 20 MG IM SOLR
5.0000 mg | Freq: Once | INTRAMUSCULAR | Status: DC
  Filled 2020-03-22: qty 20

## 2020-03-22 MED ORDER — ORAL CARE MOUTH RINSE
15.0000 mL | Freq: Two times a day (BID) | OROMUCOSAL | Status: DC
  Administered 2020-03-23 – 2020-04-27 (×23): 15 mL via OROMUCOSAL

## 2020-03-22 MED ORDER — DEXMEDETOMIDINE HCL IN NACL 200 MCG/50ML IV SOLN
0.2000 ug/kg/h | INTRAVENOUS | Status: DC
  Administered 2020-03-22 (×2): 0.4 ug/kg/h via INTRAVENOUS
  Administered 2020-03-23: 0.3 ug/kg/h via INTRAVENOUS
  Administered 2020-03-23 (×2): 0.2 ug/kg/h via INTRAVENOUS
  Filled 2020-03-22 (×6): qty 50

## 2020-03-22 MED ORDER — OLANZAPINE 5 MG PO TABS
2.5000 mg | ORAL_TABLET | Freq: Every day | ORAL | Status: DC
  Administered 2020-03-22 – 2020-03-26 (×5): 2.5 mg via ORAL
  Filled 2020-03-22 (×5): qty 1

## 2020-03-22 NOTE — Progress Notes (Signed)
PROGRESS NOTE  [name]  IAX:[id number] DOB: 1961/02/09 DOA: 03/21/2020 PCP: Riki Sheer, NP   Brief Narrative: Morgan Beasley is a 59 y.o. 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 She has been found to have pleural effusions, pulmonary edema for which diuresis has been started, and severe and waxing/waning agitated delirium requiring precedex infusion in ICU.  Assessment & Plan: Active Problems:   Anasarca  Agitated delirium: Recurrent issue for this patient, likely multifactorial. - Continue precedex, minimize use of physical restraints unless unable to titrate up precedex. Appreciate PCCM assistance.  - At last hospitalization, pt's mentation was said to have improved on buspar, tegretol, and qHS zyprexa. Also started amitriptyline qHS. Will reorder this for now pending any alternative psychiatry recommendations.  - Hoping to minimize QT prolonging medications with history of the same.  - R/o urinary retention. Doesn't appear to be in pain. ?if withdrawing. UDS not performed, now would not be helpful. - Complete work up with TSH, B12, MMA.  - Stopped cefepime.  - IVC  Acute hypoxic respiratory failure due to pulmonary edema and pleural effusions due to acute CHF on chronic OSA, tobacco use, COPD:  - Continue diuresis - Repeat CXR in AM - Continue 4L O2 as needed, wean with diuresis over the coming days. - Continue bronchodilators.  Acute on chronic combined HFrEF, HTN, anasarca: LVEF 40-45%, G2DD). BNP 1352.   - Continue lasix 40mg  IV daily - Continue metoprolol succinate - Monitor I/O, daily weights. Wt 217 > 216lbs thus far (assuming 192lbs in ED is inaccurate). Wt in July 2021 was 203lbs.   History of CAD, demand myocardial ischemia: Troponin mildly  elevated with flat trend. No chest pain or STEMI on ECG.  - Continue antiplatelet, BB, cardiac monitoring.   PVD with nonhealing left foot wound: Angiogram showed patent left fem-pop bypass during last hospitalization.  - Topical Tx's per WOC - Does not currently appear infected.   History of serratia bacteremia, pulmonic valve endocarditis: s/p 4 weeks cefepime as of 11/21. - Completed course of cefepime. Will monitor blood cultures here and remove PICC once pt less agitated.  Recent right occipital acute CVA and chronic left occipital infarct:  - Continue plavix, statin, risk factor modifications.  - MRI brain planned, currently too agitated.   Chronic pancreatitis:  - Creon  Hypokalemia:  - Supplement especially with loop diuretic  T2DM:  - Continue lantus (reduced from prior dose and currently at inpatient goal without SSI), SSI, SGLT2  Bacteriuria: only 1k colonies Enterococcus, no WBCs on micro.  Stage II CKD: At baseline based on recent lab values. - Monitor BMP with diuresis.  Anemia of chronic disease: Baseline hgb in 8-9 range. Was hemoconcentrated on admission, now back to usual range. No bleeding noted.  - Monitor CBC in AM  Right supraclavicular and mediastinal adenopathyregion unchanged since October of 2020. Pt is s/p right mastectomy. Reactive vs. metastatic vs. lymphoproliferative.  - Radiology report on CTA chest/aorta from 1026 specifically mentions the right supraclavicular LN would be amenable to U/S-guided biopsy. This will need attention following this acute illness.   QT prolongation:  - Monitor on telemetry, minimize prolonging medications. - Ionized calcium 1.11, will supplement.   Obesity: Estimated body mass index is 34.98 kg/m as calculated from the following:   Height as of this encounter: 5\' 6"  (1.676  m).   Weight as of this encounter: 98.3 kg.  DVT prophylaxis: SCDs Code Status: Full Family Communication: None at bedside Disposition Plan:   Status is: Inpatient  Remains inpatient appropriate because:Altered mental status  Dispo: The patient is from: Home              Anticipated d/c is to: TBD based on clinical trajectory              Anticipated d/c date is: > 3 days              Patient currently is not medically stable to d/c.  Consultants:   PCCM  Procedures:   None  Antimicrobials:  Cefepime ending 11/21   Subjective: This morning pt denied any issues, no trouble breathing, was calm. Later in the morning was screaming and perseverant on a baby being "down there" and disoriented.  Objective: Vitals:   03/22/20 0900 03/22/20 1000 03/22/20 1100 03/22/20 1200  BP: (!) 158/103 (!) 140/95 (!) 124/93   Pulse: (!) 117 (!) 116 (!) 113   Resp: (!) 32 (!) 21 (!) 32   Temp:    98.2 F (36.8 C)  TempSrc:    Oral  SpO2: 100% 100% 100%   Weight:      Height:        Intake/Output Summary (Last 24 hours) at 03/22/2020 1354 Last data filed at 03/22/2020 0041 Gross per 24 hour  Intake 1219.8 ml  Output --  Net 1219.8 ml   Filed Weights   03/21/20 1621 03/22/20 0427 03/22/20 0532  Weight: 98.8 kg 101 kg 98.3 kg   Gen: 59 y.o. female in no distress Pulm: Non-labored tachypnea with supplemental oxygen, diminished with crackles bilaterally.  CV: Regular tachycardia. No murmur, rub, or gallop. No JVD, + pedal edema. Very diminished peripheral pulses.  GI: Abdomen soft, non-tender, non-distended, with normoactive bowel sounds. No organomegaly or masses felt. Ext: Cool, dry Skin: Left foot dorsum with dry ulceration without surrounding erythema or discharge or odor. Neuro: Alert and disoriented. No focal neurological deficits, though not compliant with full exam. Psych: Judgement and insight appear impaired.  Data Reviewed: I have personally reviewed following labs and imaging studies  CBC: Recent Labs  Lab 03/16/20 0315 03/21/20 0748 03/21/20 0758 03/22/20 0355  WBC 8.1 8.9  --  8.7  NEUTROABS 5.0 6.2   --   --   HGB 9.9* 10.7* 12.6 9.6*  HCT 33.4* 35.0* 37.0 32.0*  MCV 81.9 79.2*  --  81.0  PLT 328 382  --  025   Basic Metabolic Panel: Recent Labs  Lab 03/16/20 0315 03/18/20 0500 03/21/20 0748 03/21/20 0758 03/22/20 0355  NA 142 140 141 142 139  K 3.6 3.9 3.4* 3.4* 3.5  CL 101 100 100 98 99  CO2 31 32 28  --  28  GLUCOSE 107* 195* 100* 99 123*  BUN 22* 24* 17 17 16   CREATININE 1.28* 1.32* 0.96 1.00 1.08*  CALCIUM 9.1 8.6* 8.4*  --  8.3*   GFR: Estimated Creatinine Clearance: 67.1 mL/min (A) (by C-G formula based on SCr of 1.08 mg/dL (H)). Liver Function Tests: Recent Labs  Lab 03/21/20 0748 03/22/20 0355  AST 31 31  ALT 22 22  ALKPHOS 203* 188*  BILITOT 0.9 0.7  PROT 7.1 6.9  ALBUMIN 2.4* 2.4*   No results for input(s): LIPASE, AMYLASE in the last 168 hours. No results for input(s): AMMONIA in the last 168 hours. Coagulation Profile: Recent Labs  Lab 03/21/20 0748  INR 1.4*   Cardiac Enzymes: No results for input(s): CKTOTAL, CKMB, CKMBINDEX, TROPONINI in the last 168 hours. BNP (last 3 results) Recent Labs    12/19/19 1546 02/09/20 0825  PROBNP 1,148* 1,185*   HbA1C: No results for input(s): HGBA1C in the last 72 hours. CBG: Recent Labs  Lab 03/18/20 1649 03/21/20 1659 03/21/20 2057 03/22/20 0813 03/22/20 1214  GLUCAP 101* 72 109* 120* 105*   Lipid Profile: No results for input(s): CHOL, HDL, LDLCALC, TRIG, CHOLHDL, LDLDIRECT in the last 72 hours. Thyroid Function Tests: No results for input(s): TSH, T4TOTAL, FREET4, T3FREE, THYROIDAB in the last 72 hours. Anemia Panel: No results for input(s): VITAMINB12, FOLATE, FERRITIN, TIBC, IRON, RETICCTPCT in the last 72 hours. Urine analysis:    Component Value Date/Time   COLORURINE STRAW (A) 03/21/2020 0730   APPEARANCEUR CLEAR 03/21/2020 0730   LABSPEC 1.006 03/21/2020 0730   PHURINE 8.0 03/21/2020 0730   GLUCOSEU NEGATIVE 03/21/2020 0730   HGBUR SMALL (A) 03/21/2020 0730   BILIRUBINUR  NEGATIVE 03/21/2020 0730   KETONESUR NEGATIVE 03/21/2020 0730   PROTEINUR 100 (A) 03/21/2020 0730   NITRITE NEGATIVE 03/21/2020 0730   LEUKOCYTESUR NEGATIVE 03/21/2020 0730   Recent Results (from the past 240 hour(s))  Urine culture     Status: Abnormal (Preliminary result)   Collection Time: 03/21/20  7:30 AM   Specimen: In/Out Cath Urine  Result Value Ref Range Status   Specimen Description   Final    IN/OUT CATH URINE Performed at [facility], [place] [address], [place], [place] [address]    Special Requests   Final    NONE Performed at [facility] [place] [address] [place], [place] [address]    Culture (A)  Final    1,000 COLONIES/mL ENTEROCOCCUS FAECIUM SUSCEPTIBILITIES TO FOLLOW Performed at [facility], [place] [address], [place], [place] [address]    Report Status PENDING  Incomplete  Resp Panel by RT-PCR (Flu A&B, Covid)     Status: None   Collection Time: 03/21/20  9:30 AM   Specimen: Nasopharyngeal(NP) swabs in vial transport medium  Result Value Ref Range Status   SARS Coronavirus 2 by RT PCR NEGATIVE NEGATIVE Final    Comment: (NOTE) SARS-CoV-2 target nucleic acids are NOT DETECTED.  The SARS-CoV-2 RNA is generally detectable in upper respiratory specimens during the acute phase of infection. The lowest concentration of SARS-CoV-2 viral copies this assay can detect is 138 copies/mL. A negative result does not preclude SARS-Cov-2 infection and should not be used as the sole basis for treatment or other patient management decisions. A negative result may occur with  improper specimen collection/handling, submission of specimen other than nasopharyngeal swab, presence of viral mutation(s) within the areas targeted by this assay, and inadequate number of viral copies(<138 copies/mL). A negative result must be combined with clinical observations, patient history, and epidemiological information. The expected result is  Negative.  Fact Sheet for Patients:  EntrepreneurPulse.com.au  Fact Sheet for Healthcare Providers:  IncredibleEmployment.be  This test is no t yet approved or cleared by the [place] FDA and  has been authorized for detection and/or diagnosis of SARS-CoV-2 by FDA under an Emergency Use Authorization (EUA). This EUA will remain  in effect (meaning this test can be used) for the duration of the COVID-19 declaration under Section 564(b)(1) of the Act, 21 U.S.C.section 360bbb-3(b)(1), unless the authorization is terminated  or revoked sooner.       Influenza A  by PCR NEGATIVE NEGATIVE Final   Influenza B by PCR NEGATIVE NEGATIVE Final    Comment: (NOTE) The Xpert Xpress SARS-CoV-2/FLU/RSV plus assay is intended as an aid in the diagnosis of influenza from Nasopharyngeal swab specimens and should not be used as a sole basis for treatment. Nasal washings and aspirates are unacceptable for Xpert Xpress SARS-CoV-2/FLU/RSV testing.  Fact Sheet for Patients: EntrepreneurPulse.com.au  Fact Sheet for Healthcare Providers: IncredibleEmployment.be  This test is not yet approved or cleared by the [place] FDA and has been authorized for detection and/or diagnosis of SARS-CoV-2 by FDA under an Emergency Use Authorization (EUA). This EUA will remain in effect (meaning this test can be used) for the duration of the COVID-19 declaration under Section 564(b)(1) of the Act, 21 U.S.C. section 360bbb-3(b)(1), unless the authorization is terminated or revoked.  Performed at [facility], [place] [address] [place], [place] [address]   Blood culture (routine x 2)     Status: None (Preliminary result)   Collection Time: 03/21/20  9:44 AM   Specimen: BLOOD LEFT HAND  Result Value Ref Range Status   Specimen Description   Final    BLOOD LEFT HAND Performed at [place]  [address] [place], [place] [address]    Special Requests   Final    BOTTLES DRAWN AEROBIC AND ANAEROBIC Blood Culture results may not be optimal due to an inadequate volume of blood received in culture bottles Performed at [place] [address], [place], [place] [address]    Culture   Final    NO GROWTH < 24 HOURS Performed at [place] [address] [place], [place] [address]    Report Status PENDING  Incomplete  MRSA PCR Screening     Status: None   Collection Time: 03/22/20  5:26 AM   Specimen: Nasopharyngeal  Result Value Ref Range Status   MRSA by PCR NEGATIVE NEGATIVE Final    Comment:        The GeneXpert MRSA Assay (FDA approved for NASAL specimens only), is one component of a comprehensive MRSA colonization surveillance program. It is not intended to diagnose MRSA infection nor to guide or monitor treatment for MRSA infections. Performed at [facility], [place] [address], [place], [place] [address]       Radiology Studies: DG Chest 1 View  Result Date: 03/22/2020 CLINICAL DATA:  Worsening altered mental status and shortness of breath. EXAM: CHEST  1 VIEW COMPARISON:  Chest radiograph and CT 03/21/2020 FINDINGS: The cardiac silhouette remains mildly enlarged. A left PICC terminates over the mid SVC, unchanged. Interstitial and basilar predominant airspace opacities are stable to minimally increased compared to yesterday's radiograph. There is a persistent small right pleural effusion. No pneumothorax is identified. IMPRESSION: Stable to minimally increased bilateral lung opacities likely reflecting edema. Persistent small right pleural effusion. Electronically Signed   By: Logan Bores M.D.   On: 03/22/2020 07:48   CT Head Wo Contrast  Result Date: 03/21/2020 CLINICAL DATA:  59 year old female with altered mental status. Recent RIGHT occipital infarct. EXAM: CT HEAD WITHOUT CONTRAST TECHNIQUE: Contiguous axial images  were obtained from the base of the skull through the vertex without intravenous contrast. COMPARISON:  02/29/2020 head CT and prior studies FINDINGS: Brain: A RIGHT occipital infarct appears unchanged from 02/29/2020. No new infarct, midline shift, hydrocephalus, extra-axial collection, hemorrhage or mass lesion identified. A remote LEFT occipital infarct and mild chronic small-vessel white matter ischemic  changes are again noted. Vascular: Carotid atherosclerotic calcifications are noted. Skull: Normal. Negative for fracture or focal lesion. Sinuses/Orbits: No acute finding. Other: None. IMPRESSION: 1. No evidence of acute intracranial abnormality. 2. Unchanged RIGHT occipital infarct.  No evidence of hemorrhage. 3. Mild chronic small-vessel white matter ischemic changes and remote LEFT occipital infarct. Electronically Signed   By: Margarette Canada M.D.   On: 03/21/2020 11:58   CT CHEST WO CONTRAST  Result Date: 03/21/2020 CLINICAL DATA:  Swelling. Wounds in bilateral lower extremities. Left arm appears swollen. Infection in left foot. Abdominal pain. Abdominal distention. EXAM: CT CHEST, ABDOMEN, AND PELVIS WITH CONTRAST TECHNIQUE: Multidetector CT imaging of the chest, abdomen and pelvis was performed following the standard protocol during bolus administration of intravenous contrast. CONTRAST:  118mL OMNIPAQUE IOHEXOL 300 MG/ML  SOLN COMPARISON:  CT of the chest, abdomen, and pelvis February 24, 2020 FINDINGS: CT CHEST FINDINGS Cardiovascular: Three-vessel calcified atherosclerosis identified. Persistent cardiomegaly. Mild atherosclerosis in the thoracic aorta. The thoracic aorta is nonaneurysmal with no dissection. Central pulmonary arteries are normal in caliber. No obvious central filling defects. Mediastinum/Nodes: The patient is status post right mastectomy. The chest wall is otherwise normal. The thyroid and esophagus are normal. There is a small left pleural effusion, similar in the interval. There is a  small to moderate right pleural effusion which is probably a little larger in the interval. No pericardial effusion. Mediastinal adenopathy again identified and not significantly changed. A right paratracheal node is unchanged measuring 2.9 by 1.8 cm on both studies. Other enlarged mediastinal are without significant change. An enlarged right supraclavicular node remains measuring 3.3 by 1.2 cm on today's study and the previous study. Shotty nodes in the bilateral axilla are stable. A left PICC line terminates in the SVC below the brachiocephalic confluence. Lungs/Pleura: Central airways are normal. No pneumothorax. Interlobular septal thickening consistent with mild edema. Right greater than left pleural effusions with associated atelectasis. No new infiltrates to suggest pneumonia. No pulmonary nodules or masses are identified. Musculoskeletal: Degenerative changes in the thoracic spine. No acute bony abnormalities. CT ABDOMEN PELVIS FINDINGS Hepatobiliary: No focal liver abnormality is seen. No gallstones, gallbladder wall thickening, or biliary dilatation. Pancreas: Unremarkable. No pancreatic ductal dilatation or surrounding inflammatory changes. Spleen: Normal in size without focal abnormality. Adrenals/Urinary Tract: Adrenal glands are unremarkable. Kidneys are normal, without renal calculi, focal lesion, or hydronephrosis. Bladder is unremarkable. Stomach/Bowel: Stomach is within normal limits. Appendix appears normal. No evidence of bowel wall thickening, distention, or inflammatory changes. Vascular/Lymphatic: Mild calcified atherosclerosis in the inferior abdominal aorta, extending into the iliac vessels. No definite adenopathy in the abdomen or pelvis. Reproductive: Status post hysterectomy. No adnexal masses. Other: Free fluid in the pelvis. Significant edema in the subcutaneous fat diffusely. Skin thickening, particularly dependently, likely due to edema. No free air. Musculoskeletal: Degenerative  changes in the left greater than right hip. Degenerative changes in the lumbar spine. IMPRESSION: 1. Free fluid in the pelvis, pleural effusions, edema in the subcutaneous fat, and skin thickening are all likely due to volume overload. 2. Pulmonary venous congestion/mild pulmonary edema. 3. Adenopathy in the right supraclavicular region and mediastinum, unchanged since October of 2020. The adenopathy could be reactive, metastatic, or due to lymphoma. Recommend clinical correlation and attention on follow-up. 4. Atherosclerosis in the thoracic and abdominal aorta. Three-vessel coronary artery disease. 5. No other significant abnormalities. Electronically Signed   By: Dorise Bullion III M.D   On: 03/21/2020 12:24   CT ABDOMEN PELVIS W CONTRAST  Result Date: 03/21/2020 CLINICAL DATA:  Swelling. Wounds in bilateral lower extremities. Left arm appears swollen. Infection in left foot. Abdominal pain. Abdominal distention. EXAM: CT CHEST, ABDOMEN, AND PELVIS WITH CONTRAST TECHNIQUE: Multidetector CT imaging of the chest, abdomen and pelvis was performed following the standard protocol during bolus administration of intravenous contrast. CONTRAST:  179mL OMNIPAQUE IOHEXOL 300 MG/ML  SOLN COMPARISON:  CT of the chest, abdomen, and pelvis February 24, 2020 FINDINGS: CT CHEST FINDINGS Cardiovascular: Three-vessel calcified atherosclerosis identified. Persistent cardiomegaly. Mild atherosclerosis in the thoracic aorta. The thoracic aorta is nonaneurysmal with no dissection. Central pulmonary arteries are normal in caliber. No obvious central filling defects. Mediastinum/Nodes: The patient is status post right mastectomy. The chest wall is otherwise normal. The thyroid and esophagus are normal. There is a small left pleural effusion, similar in the interval. There is a small to moderate right pleural effusion which is probably a little larger in the interval. No pericardial effusion. Mediastinal adenopathy again identified  and not significantly changed. A right paratracheal node is unchanged measuring 2.9 by 1.8 cm on both studies. Other enlarged mediastinal are without significant change. An enlarged right supraclavicular node remains measuring 3.3 by 1.2 cm on today's study and the previous study. Shotty nodes in the bilateral axilla are stable. A left PICC line terminates in the SVC below the brachiocephalic confluence. Lungs/Pleura: Central airways are normal. No pneumothorax. Interlobular septal thickening consistent with mild edema. Right greater than left pleural effusions with associated atelectasis. No new infiltrates to suggest pneumonia. No pulmonary nodules or masses are identified. Musculoskeletal: Degenerative changes in the thoracic spine. No acute bony abnormalities. CT ABDOMEN PELVIS FINDINGS Hepatobiliary: No focal liver abnormality is seen. No gallstones, gallbladder wall thickening, or biliary dilatation. Pancreas: Unremarkable. No pancreatic ductal dilatation or surrounding inflammatory changes. Spleen: Normal in size without focal abnormality. Adrenals/Urinary Tract: Adrenal glands are unremarkable. Kidneys are normal, without renal calculi, focal lesion, or hydronephrosis. Bladder is unremarkable. Stomach/Bowel: Stomach is within normal limits. Appendix appears normal. No evidence of bowel wall thickening, distention, or inflammatory changes. Vascular/Lymphatic: Mild calcified atherosclerosis in the inferior abdominal aorta, extending into the iliac vessels. No definite adenopathy in the abdomen or pelvis. Reproductive: Status post hysterectomy. No adnexal masses. Other: Free fluid in the pelvis. Significant edema in the subcutaneous fat diffusely. Skin thickening, particularly dependently, likely due to edema. No free air. Musculoskeletal: Degenerative changes in the left greater than right hip. Degenerative changes in the lumbar spine. IMPRESSION: 1. Free fluid in the pelvis, pleural effusions, edema in the  subcutaneous fat, and skin thickening are all likely due to volume overload. 2. Pulmonary venous congestion/mild pulmonary edema. 3. Adenopathy in the right supraclavicular region and mediastinum, unchanged since October of 2020. The adenopathy could be reactive, metastatic, or due to lymphoma. Recommend clinical correlation and attention on follow-up. 4. Atherosclerosis in the thoracic and abdominal aorta. Three-vessel coronary artery disease. 5. No other significant abnormalities. Electronically Signed   By: Dorise Bullion III M.D   On: 03/21/2020 12:24   DG Chest Port 1 View  Result Date: 03/21/2020 CLINICAL DATA:  Sepsis and shortness of breath. EXAM: PORTABLE CHEST 1 VIEW COMPARISON:  03/11/2020 and prior radiographs FINDINGS: Cardiomegaly and pulmonary vascular congestion again noted. Bilateral airspace opacities have decreased from 03/11/2020. There is no evidence of pneumothorax or large pleural effusion. A LEFT-sided PICC line is again identified. IMPRESSION: Decreased bilateral airspace which may represent pneumonia and/or edema. No other significant change. Electronically Signed   By: Margarette Canada  M.D.   On: 03/21/2020 08:04    Scheduled Meds: . amitriptyline  100 mg Oral QHS  . atorvastatin  20 mg Oral Daily  . busPIRone  10 mg Oral BID  . carbamazepine  100 mg Oral BID  . chlorhexidine  15 mL Mouth Rinse BID  . Chlorhexidine Gluconate Cloth  6 each Topical Daily  . clopidogrel  75 mg Oral Daily  . collagenase   Topical Daily  . dapagliflozin propanediol  10 mg Oral QAC breakfast  . furosemide  40 mg Intravenous Daily  . insulin aspart  0-15 Units Subcutaneous TID WC  . insulin aspart  0-5 Units Subcutaneous QHS  . insulin glargine  20 Units Subcutaneous QHS  . lipase/protease/amylase  72,000 Units Oral TID WC  . mouth rinse  15 mL Mouth Rinse q12n4p  . metoprolol succinate  50 mg Oral Daily  . mometasone-formoterol  2 puff Inhalation BID  . OLANZapine  2.5 mg Oral QHS  .  potassium chloride  40 mEq Oral BID   Continuous Infusions: . dexmedetomidine (PRECEDEX) IV infusion 0.4 mcg/kg/hr (03/22/20 1145)     LOS: 1 day   Time spent: 35 minutes.  Patrecia Pour, MD [facility] [email or web address] 03/22/2020, 1:54 PM

## 2020-03-22 NOTE — NC FL2 (Signed)
[place] LEVEL OF CARE SCREENING TOOL     IDENTIFICATION  Patient Name: Morgan Beasley Birthdate: 10/11/1960 Sex: female Admission Date (Current Location): 03/21/2020  [place] and [place] Number:  [name] [id number] [place] and Address:  [facility],  [address] [place], [place]      Provider Number: [id number]  Attending Physician Name and Address:  Patrecia Pour, MD  Relative Name and Phone Number:  [name] Daughter   [phone]    Current Level of Care: Hospital Recommended Level of Care: [place] Prior Approval Number:    Date Approved/Denied:   PASRR Number: [id number]  Discharge Plan: SNF    Current Diagnoses: Patient Active Problem List   Diagnosis Date Noted  . Anasarca 03/21/2020  . Cerebral embolism with cerebral infarction 03/01/2020  . Septic shock ([place]) 02/23/2020  . Chronic systolic CHF (congestive heart failure) ([place]) 02/23/2020  . Obesity (BMI 30-39.9) 02/23/2020  . Ischemia of left lower extremity 10/09/2019  . PVD (peripheral vascular disease) ([place])   . Diabetic peripheral neuropathy associated with type 2 diabetes mellitus ([place])   . Acute on chronic diastolic congestive heart failure ([place])   . Lower extremity edema   . Dyspnea 09/21/2019  . Bronchitis 02/25/2019  . Uncontrolled diabetes mellitus with circulatory complication, with long-term current use of insulin ([place]) 02/25/2019  . Peripheral neuropathy 02/25/2019  . COPD (chronic obstructive pulmonary disease) ([place]) 02/25/2019  . Malignant neoplasm of upper-outer quadrant of right breast in female, estrogen receptor positive ([place]) 01/08/2019  . DKA (diabetic ketoacidosis) ([place]) 02/20/2018  . Hx-TIA (transient ischemic attack) 02/20/2018  . Mixed hyperlipidemia 02/20/2018  . OAB (overactive bladder) 10/24/2017  . Poorly controlled diabetes mellitus ([place]) 10/24/2017  . Recurrent UTI 10/24/2017  . Gastritis, Helicobacter  pylori 40/34/7425  . Microscopic hematuria 09/26/2016  . Intractable vomiting 09/08/2016  . Chronic bilateral upper abdominal pain 09/08/2016  . Sleep disturbance 06/22/2016  . Tobacco dependence 01/16/2016  . Cocaine abuse ([place]) 04/24/2015  . Vasospasm ([place]) 04/24/2015  . Lymphedema of arm 02/06/2015  . Anxiety and depression 06/05/2014  . Invasive ductal carcinoma of right breast ([place]) 05/20/2014  . Gastro-esophageal reflux disease without esophagitis 04/06/2014  . Other fatigue 04/06/2014  . Snoring 04/06/2014  . Other abnormal and inconclusive findings on diagnostic imaging of breast 04/03/2014  . Obstructive sleep apnea (adult) (pediatric) 03/06/2014  . Benign essential HTN 02/18/2014    Orientation RESPIRATION BLADDER Height & Weight     Self, Place  O2 Incontinent Weight: 98.3 kg Height:  5\' 6"  (167.6 cm)  BEHAVIORAL SYMPTOMS/MOOD NEUROLOGICAL BOWEL NUTRITION STATUS      Continent Diet (regular)  AMBULATORY STATUS COMMUNICATION OF NEEDS Skin   Total Care Verbally Normal                       Personal Care Assistance Level of Assistance  Bathing, Feeding, Dressing Bathing Assistance: Maximum assistance Feeding assistance: Maximum assistance Dressing Assistance: Maximum assistance     Functional Limitations Info  Sight, Hearing, Speech Sight Info: Adequate Hearing Info: Adequate Speech Info: Adequate    SPECIAL CARE FACTORS FREQUENCY  PT (By licensed PT), OT (By licensed OT)     PT Frequency: 5x weekly OT Frequency: 5 x weekly            Contractures Contractures Info: Not present    Additional Factors Info  Code Status Code Status Info: full Allergies Info: Tramadol, Nsaids, tolmetin, Tylenol, ASA  Current Medications (03/22/2020):  This is the current hospital active medication list Current Facility-Administered Medications  Medication Dose Route Frequency Provider Last Rate Last Admin  . albuterol (VENTOLIN HFA) 108 (90 Base)  MCG/ACT inhaler 2 puff  2 puff Inhalation Q6H PRN Marylyn Ishihara, Tyrone A, DO      . amitriptyline (ELAVIL) tablet 100 mg  100 mg Oral QHS Kyle, Tyrone A, DO   100 mg at 03/21/20 2121  . atorvastatin (LIPITOR) tablet 20 mg  20 mg Oral Daily Kyle, Tyrone A, DO      . ceFEPIme (MAXIPIME) 2 g in sodium chloride 0.9 % 100 mL IVPB  2 g Intravenous Q8H Kyle, Tyrone A, DO 200 mL/hr at 03/22/20 0320 2 g at 03/22/20 0320  . chlorhexidine (PERIDEX) 0.12 % solution 15 mL  15 mL Mouth Rinse BID Kyle, Tyrone A, DO      . Chlorhexidine Gluconate Cloth 2 % PADS 6 each  6 each Topical Daily Deno Etienne, DO   6 each at 03/21/20 1321  . clopidogrel (PLAVIX) tablet 75 mg  75 mg Oral Daily Kyle, Tyrone A, DO   75 mg at 03/21/20 1828  . dapagliflozin propanediol (FARXIGA) tablet 10 mg  10 mg Oral QAC breakfast Marylyn Ishihara, Tyrone A, DO      . dexmedetomidine (PRECEDEX) 200 MCG/50ML (4 mcg/mL) infusion  0.2-0.7 mcg/kg/hr Intravenous Titrated Lang Snow, NP   Held at 03/22/20 [phone]  . furosemide (LASIX) injection 40 mg  40 mg Intravenous Daily Kyle, Tyrone A, DO      . insulin aspart (novoLOG) injection 0-15 Units  0-15 Units Subcutaneous TID WC Kyle, Tyrone A, DO      . insulin aspart (novoLOG) injection 0-5 Units  0-5 Units Subcutaneous QHS Kyle, Tyrone A, DO      . insulin glargine (LANTUS) injection 20 Units  20 Units Subcutaneous QHS Kyle, Tyrone A, DO   20 Units at 03/21/20 2210  . lipase/protease/amylase (CREON) capsule 36,000 Units  36,000 Units Oral TID PRN Minda Ditto, RPH      . lipase/protease/amylase (CREON) capsule 72,000 Units  72,000 Units Oral TID WC Minda Ditto, RPH   72,000 Units at 03/21/20 1828  . MEDLINE mouth rinse  15 mL Mouth Rinse q12n4p Kyle, Tyrone A, DO      . metoprolol succinate (TOPROL-XL) 24 hr tablet 50 mg  50 mg Oral Daily Kyle, Tyrone A, DO   50 mg at 03/22/20 0804  . mometasone-formoterol (DULERA) 200-5 MCG/ACT inhaler 2 puff  2 puff Inhalation BID Marylyn Ishihara, Tyrone A, DO   2 puff at  03/21/20 2003  . oxyCODONE (Oxy IR/ROXICODONE) immediate release tablet 5 mg  5 mg Oral Q6H PRN Marylyn Ishihara, Tyrone A, DO   5 mg at 03/22/20 0040  . potassium chloride SA (KLOR-CON) CR tablet 40 mEq  40 mEq Oral BID Marylyn Ishihara, Tyrone A, DO   40 mEq at 03/21/20 2121  . sodium chloride flush (NS) 0.9 % injection 10-40 mL  10-40 mL Intracatheter PRN Deno Etienne, DO      . ziprasidone (GEODON) injection 5 mg  5 mg Intramuscular Once Lang Snow, NP         Discharge Medications: Please see discharge summary for a list of discharge medications.  Relevant Imaging Results:  Relevant Lab Results:   Additional Information [id number]  [name], RN

## 2020-03-22 NOTE — Progress Notes (Addendum)
    BRIEF OVERNIGHT PROGRESS REPORT    SUBJECTIVE: Patient with worsening agitation overnight. Per staff, security alert called for severe agitation, aggression and combativeness. Staff unable to re-direct. PRN Lorazepam administered with no effect.  OBJECTIVE:On exam, she was afebrile with blood pressure 189/103 mm Hg and pulse rate 124 beats/min. RR 39. 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   Altered Mental Status Etiology unclear however Hx of recent embolic strokes and infectious process could be playing a role?  -Transfer to stepdown for Precedex drip -Will obtain MRI brain without contrast as follow up for CT head obtained yesterday evaluate for new ischemic event -Check Ammonia levels -Obtain ABG -Supplemental O2 as needed to maintain O2 saturations 88 to 92% if needed -High risk for intubation -Follow intermittent ABG       Rufina Falco, DNP, CCRN, FNP-BC, AGACNP-Student [facility] Nurse Practitioner Between 7pm to 7am - Pager [phone]  After 7am go to [email or web address] - [email or web address]  [facility] [employer]  [phone]

## 2020-03-22 NOTE — Progress Notes (Signed)
Patient's daughter [name] was made aware of the transfer to ICU 1226.
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
## 2020-03-22 NOTE — Consult Note (Signed)
Writer attempted to assess patient today however she is currently on a precedex gtt to help with her severe agitation and aggression. She is in restraints at this time for her safety as well as others. Her Qtc continues to fluctuate between 500-560. At this time will continue to avoid antipsychotics unless absolutely necessary.  -Will recommend starting olanzapine 2.5mg  po qhs prn for agitation and behavioral disturbances if warranted.  -Will increase Tegretol 200mg  po BID for behavioral disturbances.  -Continue Buspar 10mg  po BID for anxiety.  Will attempt to reassess her tomorrow.

## 2020-03-22 NOTE — Consult Note (Signed)
NAME:  Morgan Beasley, MRN:  [id number], DOB:  1960-05-16, LOS: 1 ADMISSION DATE:  03/21/2020, CONSULTATION DATE:  11/22 REFERRING MD:  Bonner Puna, CHIEF COMPLAINT:  Agitated delirium    Brief History   59 year old female admitted 11/21 after just being discharged on 11/18 for agitated delirium, visual hallucinations and confusion.  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  Her ongoing acute delirium was a primary factor in prolonged hospital stay  History of present illness   This is a 59 year old female who was admitted on 11/21 as a hospital readmission following a recent discharge after being admitted for bacteremia due to Serratia felt secondary to a nonhealing foot wound.  This hospitalization was complicated by several issues including: Severe agitated delirium, catheter related pulmonic valve endocarditis, renal failure, and acute right occipital CVA. At time of discharge Her mental status and improved.  Per discharge summary was awake appropriate and cooperative.  Presented to the emergency room on 11/21 with chief complaint of delirium/confusion.  Apparently not long after returning home she again was noted to be confused, having visual hallucinations and was quite agitated.  Because of this she was brought back to the emergency room for further evaluation.  The emergency room she was given Zyprexa, initial evaluation showed her to be fairly hypertensive with systolic blood pressure 976/734 blood chemistry unremarkable urinalysis unremarkable admitted for further evaluation to the internal medicine service initial therapeutic interventions included: Continuation of amitriptyline, IV hydration, and as needed Ativan in addition to psychiatric consultation.  She had been  given as needed Ativan over the course of the evening on 11/22 however a security alert was called on 11/22 as the patient became aggressive and combative because of this she was transferred to the intensive care for Precedex infusion while she underwent further evaluation with plans to check ammonia level, MRI, and arterial blood gas.  She was started on low-dose Precedex but remained fairly agitated and because of this pulmonary/critical care was asked to evaluate.  Past Medical History  CKD stage II, combined systolic and diastolic heart failure, COPD, obstructive sleep apnea, diabetes, peripheral neuropathy, hyperlipidemia, gout, hypertension, history of polysubstance abuse, peripheral vascular disease.  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  Also course complicated by severe agitated delirium/acute encephalopathy.  Was seen by psychiatry and neurology during this stay started on Tegretol and BuSpar.  As well as continued on nighttime amitriptyline  Significant Hospital Events   11/18 discharged after prolonged stay->seemed to have improved w/ regimen of Zyprexa QHS PRN, tegretol and Buspar  11/21 re-admitted for on-going agitated delirium  11/22 critical care consulted for Precedex support   Consults:  Critical care 11/22   Procedures:    Significant Diagnostic Tests:  MRI 10/31 (last admit): Acute infarct of the lateral right occipital lobe with parietotemporal extension, chronic left occipital infarct, mild chronic microvascular ischemic changes no large vessel occlusion. 11/21 CT head: Negative for acute intracranial abnormality with unchanged right occipital infarct no new hemorrhage CT chest 11/21: Free fluid in the pelvis,  bilateral pleural effusions, pulmonary venous congestion with mild pulmonary edema, adenopathy of the right supraclavicular region, mediastinum, these  are chronic findings. MRI 11/22:  Micro Data:  11/21 urine culture Enterococcus faecium 11/21 blood cultures>>> Last admit 10/25 blood culture x2 Serratia, this is pansensitive.  Antimicrobials:  Cefepime stopped 11/21 (per plan)  Interim history/subjective:  Restless and agitated   Objective   Blood pressure (Abnormal) 124/93, pulse (Abnormal) 113, temperature 98.6 F (37 C), temperature source Oral, resp. rate (Abnormal) 32, height 5' 6" (1.676 m), weight 98.3 kg, SpO2 100 %.        Intake/Output Summary (Last 24 hours) at 03/22/2020 1226 Last data filed at 03/22/2020 0041 Gross per 24 hour  Intake 1219.8 ml  Output no documentation  Net 1219.8 ml   Filed Weights   03/21/20 1621 03/22/20 0427 03/22/20 0532  Weight: 98.8 kg 101 kg 98.3 kg    Examination: General: confused and restless having hallucinations  HENT: NCAT sclera non-icteric MMM Lungs: cl dec bases. 4 liters  Cardiovascular: RRR Abdomen: obese  Extremities: dependent edema pulses strong Neuro: awake, confused agitated GU: voids  Resolved Hospital Problem list   Serratia bacteremia (completed 4 weeks cefepime 11/21)  Assessment & Plan:   Acute/on-going Agitated delirium superimposed on sub-acute embolic CVA involving ateral right occipital lobe with parietotemporal extension. -this was the reason her hospitalization was prolonged during her last stay w/ working dx being that it was driven by her stroke. Psych had been consulted. Had recommended regimen that was not continued on discharge. Not clear if admit reflects not continuing her medication rx, failure for it to respond or another new problem (although seems like this is the same issue she was treated for previously)  Plan Starting precedex for agitation for now as QTc prevents aggressive  antipsychotics  Will cont amitriptyline at HS Resume Buspar, tegretol and zyprexa. Seems like this regimen worked at least for 48hr prior to dc (at least according to chart review) Cont serial neuro checks Await Psych follow up Dc cefepime (can cause delirium)  Recent serratia bacteremia (felt 2/2 cellulitis) Plan abx stopped Await culture data Need to dc PICC  Acute on chronic combined heart failure (systolic EF 93% and gd II diastolic dysfxn) H/o CAD Plan Cont lasix  BP control Cont plavix and statin Cont bb rx Cont  Cont tele   Acute hypoxic respiratory failure 2/2 pulmonary edema Plan Supplemental oxygen  Pulse ox  H/o COPD Plan Cont BDs (currently on Dulera)  CKD stage II Scr stable Plan Trend cmp Avoid nephrotoxins Avoid hypotension  H/o DM Plan ssi and lantus  Anemia of chronic disease Plan Trend cbc  Best practice:  Per primary   Labs   CBC: Recent Labs  Lab 03/16/20 0315 03/21/20 0748 03/21/20 0758 03/22/20 0355  WBC 8.1 8.9  --  8.7  NEUTROABS 5.0 6.2  --   --   HGB 9.9* 10.7* 12.6 9.6*  HCT 33.4* 35.0* 37.0 32.0*  MCV 81.9 79.2*  --  81.0  PLT 328 382  --  570    Basic Metabolic Panel: Recent Labs  Lab 03/16/20 0315 03/18/20 0500 03/21/20 0748 03/21/20 0758 03/22/20 0355  NA 142 140 141 142 139  K 3.6 3.9 3.4* 3.4* 3.5  CL 101 100 100 98 99  CO2 31 32 28  --  28  GLUCOSE 107* 195* 100* 99 123*  BUN 22* 24* _0 CREATININE 1.28* 1.32* 0.96 1.00 1.08*  CALCIUM 9.1 8.6* 8.4*  --  8.3*   GFR: Estimated Creatinine Clearance: 67.1 mL/min (A) (by C-G formula based on  SCr of 1.08 mg/dL (H)). Recent Labs  Lab 03/16/20 0315 03/21/20 0748 03/22/20 0355  WBC 8.1 8.9 8.7  LATICACIDVEN  --  1.4  --     Liver Function Tests: Recent Labs  Lab 03/21/20 0748 03/22/20 0355  AST 31 31  ALT 22 22  ALKPHOS 203* 188*  BILITOT 0.9 0.7  PROT 7.1 6.9  ALBUMIN 2.4* 2.4*   No results for input(s): LIPASE, AMYLASE in the  last 168 hours. No results for input(s): AMMONIA in the last 168 hours.  ABG    Component Value Date/Time   PHART 7.417 03/22/2020 0600   PCO2ART 50.9 (H) 03/22/2020 0600   PO2ART 32.1 (LL) 03/22/2020 0600   HCO3 32.2 (H) 03/22/2020 0600   TCO2 29 03/21/2020 0758   ACIDBASEDEF 1.7 02/25/2020 0845   O2SAT 49.4 03/22/2020 0600     Coagulation Profile: Recent Labs  Lab 03/21/20 0748  INR 1.4*    Cardiac Enzymes: No results for input(s): CKTOTAL, CKMB, CKMBINDEX, TROPONINI in the last 168 hours.  HbA1C: Hgb A1c MFr Bld  Date/Time Value Ref Range Status  02/23/2020 02:53 PM 15.1 (H) 4.8 - 5.6 % Final    Comment:    (NOTE)         Prediabetes: 5.7 - 6.4         Diabetes: >6.4         Glycemic control for adults with diabetes: <7.0   09/22/2019 05:03 AM 11.7 (H) 4.8 - 5.6 % Final    Comment:    (NOTE) Pre diabetes:          5.7%-6.4% Diabetes:              >6.4% Glycemic control for   <7.0% adults with diabetes     CBG: Recent Labs  Lab 03/18/20 1649 03/21/20 1659 03/21/20 2057 03/22/20 0813 03/22/20 1214  GLUCAP 101* 72 109* 120* 105*    Review of Systems:   Not able  Past Medical History  She,  has a past medical history of Alcohol dependence ([place]), Anemia, Anxiety, Breast cancer ([place]), Chronic combined systolic and diastolic CHF (congestive heart failure) ([place]), Cigarette nicotine dependence, CKD (chronic kidney disease), stage III ([place]), Colon polyps, COPD (chronic obstructive pulmonary disease) ([place]), Diabetes mellitus without complication ([place]), Diabetic neuropathy ([place]), Gout, Hyperlipemia, Hypertension, Insomnia, Lymphedema, Marijuana use, Mild CAD (2016), OSA treated with BiPAP, PAD (peripheral artery disease) ([place]), Pancreatitis, Sleep apnea, and Ulcer of foot ([place]).   Surgical History    Past Surgical History:  Procedure Laterality Date  . ABDOMINAL AORTOGRAM W/LOWER EXTREMITY N/A 09/26/2019   Procedure: ABDOMINAL AORTOGRAM W/LOWER EXTREMITY;   Surgeon: Angelia Mould, MD;  Location: [place] [facility];  Service: Cardiovascular;  Laterality: N/A;  . ABDOMINAL AORTOGRAM W/LOWER EXTREMITY Bilateral 12/08/2019   Procedure: ABDOMINAL AORTOGRAM W/LOWER EXTREMITY;  Surgeon: Waynetta Sandy, MD;  Location: [place] [facility];  Service: Cardiovascular;  Laterality: Bilateral;  . ABDOMINAL AORTOGRAM W/LOWER EXTREMITY Left 01/26/2020   Procedure: ABDOMINAL AORTOGRAM W/LOWER EXTREMITY;  Surgeon: Waynetta Sandy, MD;  Location: [place] [facility];  Service: Cardiovascular;  Laterality: Left;  . ABDOMINAL HYSTERECTOMY    . BUBBLE STUDY  03/04/2020   Procedure: BUBBLE STUDY;  Surgeon: Elouise Munroe, MD;  Location: [facility] ENDOSCOPY;  Service: Cardiology;;  . [place] [facility]  . FEMORAL-POPLITEAL BYPASS GRAFT Left 10/14/2019   Procedure: BYPASS GRAFT LEFT FEMORAL-POPLITEAL ARTERY USING NONREVERSED SAPHENOUS VEIN;  Surgeon: Waynetta Sandy, MD;  Location: [facility] OR;  Service: Vascular;  Laterality: Left;  Marland Kitchen MASTECTOMY Right April 2016  . PERIPHERAL VASCULAR ATHERECTOMY Left 01/26/2020   Procedure: PERIPHERAL VASCULAR ATHERECTOMY;  Surgeon: Waynetta Sandy, MD;  Location: [place] [facility];  Service: Cardiovascular;  Laterality: Left;  PT and AT - [facility]  . PERIPHERAL VASCULAR BALLOON ANGIOPLASTY Left 01/26/2020   Procedure: PERIPHERAL VASCULAR BALLOON ANGIOPLASTY;  Surgeon: Waynetta Sandy, MD;  Location: [place] [facility];  Service: Cardiovascular;  Laterality: Left;  [facility]   . PERIPHERAL VASCULAR INTERVENTION Left 09/26/2019   Procedure: PERIPHERAL VASCULAR INTERVENTION;  Surgeon: Angelia Mould, MD;  Location: [place] [facility];  Service: Cardiovascular;  Laterality: Left;  superficial femoral  . TEE WITHOUT CARDIOVERSION N/A 03/04/2020   Procedure: TRANSESOPHAGEAL ECHOCARDIOGRAM (TEE);  Surgeon: Elouise Munroe, MD;  Location: [place];  Service: Cardiology;   Laterality: N/A;     Social History   reports that she has been smoking cigarettes. She has never used smokeless tobacco. She reports previous alcohol use. She reports previous drug use. Drug: Marijuana.   Family History   Her family history includes Breast cancer in her maternal grandmother and paternal grandmother; Diabetes in an other family member; Heart disease in an other family member; Stroke in her son. There is no history of Colon cancer, Esophageal cancer, Rectal cancer, or Stomach cancer.   Allergies Allergies  Allergen Reactions  . Tramadol Swelling  . Nsaids Other (See Comments)    Pancreatitis  . Tolmetin Other (See Comments)    Pancreatitis  . Tylenol [Acetaminophen] Other (See Comments)    unknown  . Aspirin Other (See Comments)    "Makes my pancreas act up"      Home Medications  Prior to Admission medications   Medication Sig Start Date End Date Taking? Authorizing Provider  albuterol (PROAIR HFA) 108 (90 Base) MCG/ACT inhaler Inhale 2 puffs into the lungs every 6 (six) hours as needed for wheezing or shortness of breath.  05/19/14  Yes [provider]  amitriptyline (ELAVIL) 100 MG tablet Take 100 mg by mouth at bedtime.    Yes [provider]  atorvastatin (LIPITOR) 20 MG tablet Take 1 tablet (20 mg total) by mouth daily. 03/19/20 04/18/20 Yes Arrien, Jimmy Picket, MD  budesonide-formoterol [facility]) 160-4.5 MCG/ACT inhaler Inhale 2 puffs into the lungs 2 (two) times daily.   Yes [provider]  clopidogrel (PLAVIX) 75 MG tablet Take 1 tablet (75 mg total) by mouth daily. 09/27/19 09/26/20 Yes Brimage, Ronnette Juniper, DO  dapagliflozin propanediol (FARXIGA) 10 MG TABS tablet Take 1 tablet (10 mg total) by mouth daily before breakfast. 09/26/19  Yes Angelia Mould, MD  furosemide (LASIX) 40 MG tablet Take 1 tablet (40 mg total) by mouth daily. 03/19/20 04/18/20 Yes Arrien, Jimmy Picket, MD  gabapentin (NEURONTIN) 400 MG capsule Take  1 capsule (400 mg total) by mouth 3 (three) times daily for 30 doses. 03/18/20 03/28/20 Yes Arrien, Jimmy Picket, MD  insulin glargine (LANTUS) 100 UNIT/ML injection Inject 0.4 mLs (40 Units total) into the skin daily. Patient taking differently: Inject 80 Units into the skin daily.  09/27/19  Yes Brimage, Vondra, DO  insulin lispro (HUMALOG) 100 UNIT/ML injection Inject 14-20 Units into the skin 3 (three) times daily before meals.    Yes [provider]  lipase/protease/amylase (CREON) 36000 UNITS CPEP capsule Take 2 capsule by mouth with each meal and 1 capsule by mouth with each snack Patient taking differently: Take 36,000-72,000 Units  by mouth See admin instructions. Take 72000 units by mouth with each meal and 36000 units by mouth with each snack 09/17/19  Yes Armbruster, Carlota Raspberry, MD  LORazepam (ATIVAN) 0.5 MG tablet Take 1 tablet (0.5 mg total) by mouth every 6 (six) hours as needed for anxiety. 03/07/20  Yes Shelly Coss, MD  metoprolol succinate (TOPROL-XL) 50 MG 24 hr tablet Take 1 tablet (50 mg total) by mouth daily. Take with or immediately following a meal. Patient taking differently: Take 50 mg by mouth daily.  12/22/19  Yes Fay Records, MD  Multiple Vitamins-Minerals (ADULT ONE DAILY GUMMIES) CHEW Chew 1 capsule by mouth daily.   Yes [provider]  NARCAN 4 MG/0.1ML LIQD nasal spray kit Place 1 spray into the nose as needed (opioid overdose).  11/25/19  Yes [provider]  oxyCODONE (ROXICODONE) 15 MG immediate release tablet Take 15 mg by mouth every 6 (six) hours as needed for pain.   Yes [provider]  potassium chloride (K-DUR) 10 MEQ tablet Take 10 mEq by mouth daily as needed (For cramps).    Yes [provider]  ceFEPime (MAXIPIME) IVPB Inject 2 g into the vein every 12 (twelve) hours for 16 days. Indication:  Endocarditis  First Dose: No Last Day of Therapy:  03/21/2020 Labs - Once weekly:  CBC/D and BMP, Labs - Every other  week:  ESR and CRP Method of administration: IV Push Method of administration may be changed at the discretion of home infusion pharmacist based upon assessment of the patient and/or caregiver's ability to self-administer the medication ordered. 03/07/20 03/23/20  Shelly Coss, MD     Critical care time: 34 min   Erick Colace ACNP-BC [facility] Pager # [phone] OR # [phone] if no answer

## 2020-03-22 NOTE — TOC Initial Note (Addendum)
Transition of Care [facility]) - Initial/Assessment Note    Patient Details  Name: Morgan Beasley MRN: [id number] Date of Birth: 03-Nov-1960  [facility] of [name]) CM/SW Contact:    [name], RN Phone Number: 03/22/2020, 8:22 AM  Clinical Narrative:                 59 y.o. female with medical history significant of CVA, COPD, DM2, diastolic HF. Presenting with 2 days of confusion. She was recently discharged from the hospital for CVA and septic shock d/t gram negative bacteremia. She was on cefepime through today. Per her daughter's report, the patient has been confused the entire time she has been home. She has been hallucinating and agitated. She has been uncontrollable there, so she sent her back to the ED.   ED Course: She has been agitated. ED has given her zyprexa. She was noted to have anasarca. She was given lasix. TRH was called for admission.   Review of Systems:  Reports BLE leg pain. Denies CP, palpitations, N/V/ab pain. Review of systems is otherwise negative for all not mentioned in HPI.   Plan: snf p[lacement Will follow for progression. tct-[name] Flatten daughter/ would like for her to go to [place] sent to [place] has declined will follow to see who does accept patient. Expected Discharge Plan: Skilled Nursing Facility Barriers to Discharge: No Barriers Identified   Patient Goals and CMS Choice Patient states their goals for this hospitalization and ongoing recovery are:: unable to state due to confusion CMS Medicare.gov Compare Post Acute Care list provided to:: Patient Represenative (must comment) (daughters) Choice offered to / list presented to : Adult Children  Expected Discharge Plan and Services Expected Discharge Plan: [name]   Discharge Planning Services: CM Consult Post Acute Care Choice: [place] Living arrangements for the past 2 months: [name]                                       Prior Living Arrangements/Services Living arrangements for the past 2 months: [name] Lives with:: Facility Resident                   Activities of Daily Living      Permission Sought/Granted                  Emotional Assessment              Admission diagnosis:  Anasarca [R60.1] Delirium [R41.0] Hypervolemia, unspecified hypervolemia type [E87.70] Acute on chronic congestive heart failure, unspecified heart failure type [facility]) [I50.9] Patient Active Problem List   Diagnosis Date Noted  . Anasarca 03/21/2020  . Cerebral embolism with cerebral infarction 03/01/2020  . Septic shock ([place]) 02/23/2020  . Chronic systolic CHF (congestive heart failure) ([name]) 02/23/2020  . Obesity (BMI 30-39.9) 02/23/2020  . Ischemia of left lower extremity 10/09/2019  . PVD (peripheral vascular disease) ([place])   . Diabetic peripheral neuropathy associated with type 2 diabetes mellitus ([name])   . Acute on chronic diastolic congestive heart failure ([name])   . Lower extremity edema   . Dyspnea 09/21/2019  . Bronchitis 02/25/2019  . Uncontrolled diabetes mellitus with circulatory complication, with long-term current use of insulin ([name]) 02/25/2019  . Peripheral neuropathy 02/25/2019  . COPD (chronic obstructive pulmonary disease) ([name]) 02/25/2019  . Malignant neoplasm of  upper-outer quadrant of right breast in female, estrogen receptor positive ([name]) 01/08/2019  . DKA (diabetic ketoacidosis) ([name]) 02/20/2018  . Hx-TIA (transient ischemic attack) 02/20/2018  . Mixed hyperlipidemia 02/20/2018  . OAB (overactive bladder) 10/24/2017  . Poorly controlled diabetes mellitus ([name]) 10/24/2017  . Recurrent UTI 10/24/2017  . Gastritis, Helicobacter pylori 94/85/4627  . Microscopic hematuria 09/26/2016  . Intractable vomiting 09/08/2016  . Chronic bilateral upper abdominal pain 09/08/2016  . Sleep disturbance 06/22/2016  . Tobacco  dependence 01/16/2016  . Cocaine abuse ([name]) 04/24/2015  . Vasospasm ([name]) 04/24/2015  . Lymphedema of arm 02/06/2015  . Anxiety and depression 06/05/2014  . Invasive ductal carcinoma of right breast ([name]) 05/20/2014  . Gastro-esophageal reflux disease without esophagitis 04/06/2014  . Other fatigue 04/06/2014  . Snoring 04/06/2014  . Other abnormal and inconclusive findings on diagnostic imaging of breast 04/03/2014  . Obstructive sleep apnea (adult) (pediatric) 03/06/2014  . Benign essential HTN 02/18/2014   PCP:  Riki Sheer, NP Pharmacy:   [facility] [place], [place] - [address] [place] [address] Phone: [phone] Fax: [phone]     Social Determinants of Health (SDOH) Interventions    Readmission Risk Interventions Readmission Risk Prevention Plan 03/18/2020 10/21/2019  Transportation Screening Complete Complete  Medication Review (RN Care Manager) - Complete  PCP or Specialist appointment within 3-5 days of discharge Not Complete Complete  PCP/Specialist Appt Not Complete comments First available appt 12/1 -  [place] or [place] Complete Complete  SW Recovery Care/Counseling Consult Complete Complete  Palliative Care Screening Not Applicable Not [place] Not Applicable Not Applicable  Some recent data might be hidden

## 2020-03-22 NOTE — Progress Notes (Signed)
This RT attempted to get an ABG x 2 without success due to patient mental status.

## 2020-03-22 NOTE — Progress Notes (Deleted)
CRITICAL VALUE ALERT  Critical Value:  INR @ 0000 5; INR now 4.7  Date & Time Notied:  03/22/2020 0620  Provider Notified: Lacretia Nicks   Orders Received/Actions taken: awaiting orders

## 2020-03-22 NOTE — Consult Note (Signed)
[place] Nurse Consult Note: Reason for Consult: Consult requested for left foot wound. Wound type: Left anterior foot with chronic full thickness wound Measurement: 4X5X.3cm Wound bed: 100% dry yellow tightly adhered yellow slough Drainage (amount, consistency, odor) no odor, drainage, or fluctuance Periwound: intact skin surrounding Dressing procedure/placement/frequency: Topical treatment orders provided for bedside nurses to perform as follows to assist with enzymatic debridement of nonviable tissue: Apply Santyl to left foot wound Q day, then cover with moist gauze and foam dressing. (Change foam dressing Q 3 days or PRN soiling.). Please re-consult if further assistance is needed.  Thank-you,  Julien Girt MSN, [place], [place], [place], [place]

## 2020-03-22 NOTE — Progress Notes (Signed)
Patient transferred to ICU 1226.

## 2020-03-23 ENCOUNTER — Inpatient Hospital Stay (HOSPITAL_COMMUNITY): Payer: Medicare Other

## 2020-03-23 DIAGNOSIS — R609 Edema, unspecified: Secondary | ICD-10-CM

## 2020-03-23 DIAGNOSIS — R410 Disorientation, unspecified: Secondary | ICD-10-CM

## 2020-03-23 LAB — CBC
HCT: 30.6 % — ABNORMAL LOW (ref 36.0–46.0)
Hemoglobin: 9.1 g/dL — ABNORMAL LOW (ref 12.0–15.0)
MCH: 24.6 pg — ABNORMAL LOW (ref 26.0–34.0)
MCHC: 29.7 g/dL — ABNORMAL LOW (ref 30.0–36.0)
MCV: 82.7 fL (ref 80.0–100.0)
Platelets: 328 10*3/uL (ref 150–400)
RBC: 3.7 MIL/uL — ABNORMAL LOW (ref 3.87–5.11)
RDW: 20.4 % — ABNORMAL HIGH (ref 11.5–15.5)
WBC: 6.6 10*3/uL (ref 4.0–10.5)
nRBC: 2.3 % — ABNORMAL HIGH (ref 0.0–0.2)

## 2020-03-23 LAB — GLUCOSE, CAPILLARY
Glucose-Capillary: 76 mg/dL (ref 70–99)
Glucose-Capillary: 57 mg/dL — ABNORMAL LOW (ref 70–99)
Glucose-Capillary: 63 mg/dL — ABNORMAL LOW (ref 70–99)
Glucose-Capillary: 90 mg/dL (ref 70–99)
Glucose-Capillary: 82 mg/dL (ref 70–99)

## 2020-03-23 LAB — COMPREHENSIVE METABOLIC PANEL
ALT: 19 U/L (ref 0–44)
AST: 23 U/L (ref 15–41)
Albumin: 2.2 g/dL — ABNORMAL LOW (ref 3.5–5.0)
Alkaline Phosphatase: 158 U/L — ABNORMAL HIGH (ref 38–126)
Anion gap: 10 (ref 5–15)
BUN: 22 mg/dL — ABNORMAL HIGH (ref 6–20)
CO2: 28 mmol/L (ref 22–32)
Calcium: 7.9 mg/dL — ABNORMAL LOW (ref 8.9–10.3)
Chloride: 103 mmol/L (ref 98–111)
Creatinine, Ser: 1.48 mg/dL — ABNORMAL HIGH (ref 0.44–1.00)
GFR, Estimated: 41 mL/min — ABNORMAL LOW (ref >60)
Glucose, Bld: 93 mg/dL (ref 70–99)
Potassium: 4.1 mmol/L (ref 3.5–5.1)
Sodium: 141 mmol/L (ref 135–145)
Total Bilirubin: 0.6 mg/dL (ref 0.3–1.2)
Total Protein: 6.4 g/dL — ABNORMAL LOW (ref 6.5–8.1)

## 2020-03-23 LAB — URINE CULTURE: Culture: 1000 — AB

## 2020-03-23 MED ORDER — FUROSEMIDE 10 MG/ML IJ SOLN
80.0000 mg | Freq: Every day | INTRAMUSCULAR | Status: DC
  Administered 2020-03-23 – 2020-03-29 (×7): 80 mg via INTRAVENOUS
  Filled 2020-03-23 (×7): qty 8

## 2020-03-23 MED ORDER — INSULIN ASPART 100 UNIT/ML ~~LOC~~ SOLN
0.0000 [IU] | Freq: Three times a day (TID) | SUBCUTANEOUS | Status: DC
  Administered 2020-03-24 (×2): 1 [IU] via SUBCUTANEOUS
  Administered 2020-03-26: 2 [IU] via SUBCUTANEOUS
  Administered 2020-03-28 – 2020-04-02 (×9): 1 [IU] via SUBCUTANEOUS
  Administered 2020-04-03: 2 [IU] via SUBCUTANEOUS
  Administered 2020-04-03 – 2020-04-09 (×17): 1 [IU] via SUBCUTANEOUS
  Administered 2020-04-12: 2 [IU] via SUBCUTANEOUS
  Administered 2020-04-12: 1 [IU] via SUBCUTANEOUS
  Administered 2020-04-13: 3 [IU] via SUBCUTANEOUS
  Administered 2020-04-13: 2 [IU] via SUBCUTANEOUS
  Administered 2020-04-14 – 2020-04-27 (×13): 1 [IU] via SUBCUTANEOUS

## 2020-03-23 MED ORDER — LORAZEPAM 2 MG/ML IJ SOLN
0.5000 mg | Freq: Four times a day (QID) | INTRAMUSCULAR | Status: DC | PRN
  Administered 2020-03-23 – 2020-03-25 (×5): 0.5 mg via INTRAVENOUS
  Filled 2020-03-23 (×5): qty 1

## 2020-03-23 MED ORDER — CALCIUM GLUCONATE-NACL 1-0.675 GM/50ML-% IV SOLN
1.0000 g | Freq: Once | INTRAVENOUS | Status: AC
  Administered 2020-03-23: 1000 mg via INTRAVENOUS
  Filled 2020-03-23: qty 50

## 2020-03-23 MED ORDER — LACTATED RINGERS IV BOLUS: 1000.0000 mL | Freq: Once | INTRAVENOUS | Status: DC

## 2020-03-23 MED ORDER — INSULIN ASPART 100 UNIT/ML ~~LOC~~ SOLN
0.0000 [IU] | Freq: Every day | SUBCUTANEOUS | Status: DC
  Administered 2020-03-24 – 2020-04-01 (×2): 2 [IU] via SUBCUTANEOUS
  Administered 2020-04-03: 3 [IU] via SUBCUTANEOUS
  Administered 2020-04-05 – 2020-04-13 (×4): 2 [IU] via SUBCUTANEOUS

## 2020-03-23 MED ORDER — INSULIN GLARGINE 100 UNIT/ML ~~LOC~~ SOLN
10.0000 [IU] | Freq: Every day | SUBCUTANEOUS | Status: DC
  Administered 2020-03-23 – 2020-04-21 (×27): 10 [IU] via SUBCUTANEOUS
  Filled 2020-03-23 (×30): qty 0.1

## 2020-03-23 MED ORDER — SIMETHICONE 80 MG PO CHEW
80.0000 mg | CHEWABLE_TABLET | Freq: Four times a day (QID) | ORAL | Status: DC | PRN
  Administered 2020-03-23 – 2020-04-23 (×6): 80 mg via ORAL
  Filled 2020-03-23 (×7): qty 1

## 2020-03-23 MED ORDER — DOCUSATE SODIUM 100 MG PO CAPS
100.0000 mg | ORAL_CAPSULE | Freq: Two times a day (BID) | ORAL | Status: DC
  Administered 2020-03-23 – 2020-04-22 (×41): 100 mg via ORAL
  Filled 2020-03-23 (×52): qty 1

## 2020-03-23 MED ORDER — FUROSEMIDE 10 MG/ML IJ SOLN: 40.0000 mg | Freq: Two times a day (BID) | INTRAMUSCULAR | Status: DC

## 2020-03-23 NOTE — Progress Notes (Signed)
NAME:  Morgan Beasley, MRN:  [id number], DOB:  January 23, 1961, LOS: 2 ADMISSION DATE:  03/21/2020, CONSULTATION DATE:  11/22 REFERRING MD:  Bonner Puna, CHIEF COMPLAINT:  Agitated delirium    Brief History   59 year old female admitted 11/21 after just being discharged on 11/18 for agitated delirium, visual hallucinations and confusion.  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  Her ongoing acute delirium was a primary factor in prolonged hospital stay  History of present illness   This is a 59 year old female who was admitted on 11/21 as a hospital readmission following a recent discharge after being admitted for bacteremia due to Serratia felt secondary to a nonhealing foot wound.  This hospitalization was complicated by several issues including: Severe agitated delirium, catheter related pulmonic valve endocarditis, renal failure, and acute right occipital CVA. At time of discharge Her mental status and improved.  Per discharge summary was awake appropriate and cooperative.  Presented to the emergency room on 11/21 with chief complaint of delirium/confusion.  Apparently not long after returning home she again was noted to be confused, having visual hallucinations and was quite agitated.  Because of this she was brought back to the emergency room for further evaluation.  The emergency room she was given Zyprexa, initial evaluation showed her to be fairly hypertensive with systolic blood pressure 660/630 blood chemistry unremarkable urinalysis unremarkable admitted for further evaluation to the internal medicine service initial therapeutic interventions included: Continuation of amitriptyline, IV hydration, and as needed Ativan in addition to psychiatric consultation.  She had been  given as needed Ativan over the course of the evening on 11/22 however a security alert was called on 11/22 as the patient became aggressive and combative because of this she was transferred to the intensive care for Precedex infusion while she underwent further evaluation with plans to check ammonia level, MRI, and arterial blood gas.  She was started on low-dose Precedex but remained fairly agitated and because of this pulmonary/critical care was asked to evaluate.  Past Medical History  CKD stage II, combined systolic and diastolic heart failure, COPD, obstructive sleep apnea, diabetes, peripheral neuropathy, hyperlipidemia, gout, hypertension, history of polysubstance abuse, peripheral vascular disease.  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  Also course complicated by severe agitated delirium/acute encephalopathy.  Was seen by psychiatry and neurology during this stay started on Tegretol and BuSpar.  As well as continued on nighttime amitriptyline  Significant Hospital Events   11/18 discharged after prolonged stay->seemed to have improved w/ regimen of Zyprexa QHS PRN, tegretol and Buspar  11/21 re-admitted for on-going agitated delirium  11/22 critical care consulted for Precedex support   Consults:  Critical care 11/22   Procedures:    Significant Diagnostic Tests:  MRI 10/31 (last admit): Acute infarct of the lateral right occipital lobe with parietotemporal extension, chronic left occipital infarct, mild chronic microvascular ischemic changes no large vessel occlusion. 11/21 CT head: Negative for acute intracranial abnormality with unchanged right occipital infarct no new hemorrhage CT chest 11/21: Free fluid in the pelvis,  bilateral pleural effusions, pulmonary venous congestion with mild pulmonary edema, adenopathy of the right supraclavicular region, mediastinum, these  are chronic findings. MRI 11/23:  Micro Data:  11/21 urine culture Enterococcus faecium 11/21 blood cultures>>> Last admit 10/25 blood culture x2 Serratia, this is pansensitive.  Antimicrobials:  Cefepime stopped 11/21 (per plan)  Interim history/subjective:  Remains on precedex  Intermittently tearful this morning   Objective   Blood pressure 120/79, pulse 86, temperature 98.1 F (36.7 C), temperature source Axillary, resp. rate (!) 34, height 5\' 6"  (1.676 m), weight 98.4 kg, SpO2 99 %.        Intake/Output Summary (Last 24 hours) at 03/23/2020 0721 Last data filed at 03/23/2020 0600 Gross per 24 hour  Intake 207.51 ml  Output 600 ml  Net -392.49 ml   Filed Weights   03/22/20 0427 03/22/20 0532 03/23/20 0500  Weight: 101 kg 98.3 kg 98.4 kg    Examination: General: chronically ill appearing middle aged F, asleep NAD  HENT: NCAT pink mm trachea midline  Lungs: Symmetrical chest expansion, diminished bibasilar sounds. No adventitious sounds  Cardiovascular: rrr s1s2 no rgm  Abdomen: Obese soft round ndnt  Extremities: BUE soft wrist restraints. LUE PICC, LUE edema  Neuro: Awakens to stimulation, drowsy  GU: defer  Skin: scattered scabbed lesions on BLE. Dry skin.  Psych: intermittently tearful when awakened   Resolved Hospital Problem list   Serratia bacteremia (completed 4 weeks cefepime 11/21)  Assessment & Plan:   Acute vs acute on chronic encephalopathy  -superimposed on subacute embolic CVA  -recurring hallucinations. I see in recent hospitalization w hallucinations, mood lability, tangential thoughts -did not continue psych regimen on discharge, unclear if discontinuation of these medications has precipitated present symptoms or if new prblem  Plan -psych will follow (was too somnolent for adequate psych eval  11/22) -f/u MRI brain  -olanzapine 2.5 PO qHS  -tegretol 200mg  BID  -Buspar 10mg  BID   -qHS elavil  -down-titrate precedex as able  -qtc monitoring -sitter   Acute hypoxic respiratory failure 2/2 pulmonary edema Hx COPD Plan -BDs -Supplemental O2 as needed for SpO2 88-92%   Acute on chronic combined heart failure (systolic EF 78% and grade II diastolic dysfxn) H/o CAD Plan  -cardiac monitoring -plavix, statin, beta blockade -Lasix   AKI on CKD II Cr 1.48 from 1.08 Plan -trend renal indices, UOP -minimize nephrotoxins as able -goal MAP > 65 for renal perfusion   LUE Edema -surrounding PICC P -dc PICC -f/u LUE venous duplex  -will need alternative IV access (RUE restricted)   Recent serratia bacteremia (felt 2/2 cellulitis) Plan -trend fever curve -follow cx data -dc PICC   Anemia of chronic disease -slight downtrend since admit, possible degree of iatrogenic component with fq labs  Plan -trend CBC -- can likely de-escalate from qD to qOD  -goal Hgb > 8 with CAD hx   DM Plan -Lantus  + SSI    Best practice:  Per primary   Labs   CBC: Recent Labs  Lab 03/21/20 0748 03/21/20 0758 03/22/20 0355 03/23/20 0516  WBC 8.9  --  8.7 6.6  NEUTROABS 6.2  --   --   --   HGB 10.7* 12.6 9.6* 9.1*  HCT 35.0* 37.0 32.0* 30.6*  MCV 79.2*  --  81.0 82.7  PLT 382  --  364 938    Basic Metabolic Panel: Recent Labs  Lab 03/18/20 0500 03/21/20 0748 03/21/20 0758 03/22/20 0355 03/23/20 0516  NA 140 141 142 139 141  K 3.9 3.4* 3.4* 3.5 4.1  CL 100 100 98 99 103  CO2 32  28  --  28 28  GLUCOSE 195* 100* 99 123* 93  BUN 24* 17 17 16  22*  CREATININE 1.32* 0.96 1.00 1.08* 1.48*  CALCIUM 8.6* 8.4*  --  8.3* 7.9*   GFR: Estimated Creatinine Clearance: 49 mL/min (A) (by C-G formula based on SCr of 1.48 mg/dL (H)). Recent Labs  Lab 03/21/20 0748 03/22/20 0355 03/23/20 0516  WBC 8.9 8.7 6.6  LATICACIDVEN 1.4  --   --     Liver Function Tests: Recent  Labs  Lab 03/21/20 0748 03/22/20 0355 03/23/20 0516  AST 31 31 23   ALT 22 22 19   ALKPHOS 203* 188* 158*  BILITOT 0.9 0.7 0.6  PROT 7.1 6.9 6.4*  ALBUMIN 2.4* 2.4* 2.2*   No results for input(s): LIPASE, AMYLASE in the last 168 hours. Recent Labs  Lab 03/22/20 1645  AMMONIA 26    ABG    Component Value Date/Time   PHART 7.417 03/22/2020 0600   PCO2ART 50.9 (H) 03/22/2020 0600   PO2ART 32.1 (LL) 03/22/2020 0600   HCO3 32.2 (H) 03/22/2020 0600   TCO2 29 03/21/2020 0758   ACIDBASEDEF 1.7 02/25/2020 0845   O2SAT 49.4 03/22/2020 0600     Coagulation Profile: Recent Labs  Lab 03/21/20 0748  INR 1.4*    Cardiac Enzymes: No results for input(s): CKTOTAL, CKMB, CKMBINDEX, TROPONINI in the last 168 hours.  HbA1C: Hgb A1c MFr Bld  Date/Time Value Ref Range Status  02/23/2020 02:53 PM 15.1 (H) 4.8 - 5.6 % Final    Comment:    (NOTE)         Prediabetes: 5.7 - 6.4         Diabetes: >6.4         Glycemic control for adults with diabetes: <7.0   09/22/2019 05:03 AM 11.7 (H) 4.8 - 5.6 % Final    Comment:    (NOTE) Pre diabetes:          5.7%-6.4% Diabetes:              >6.4% Glycemic control for   <7.0% adults with diabetes     CBG: Recent Labs  Lab 03/21/20 2057 03/22/20 0813 03/22/20 1214 03/22/20 1552 03/22/20 2139  GLUCAP 109* 120* 105* 119* 73    CRITICAL CARE Performed by: Cristal Generous  Total critical care time: 35 minutes  Critical care time was exclusive of separately billable procedures and treating other patients. Critical care was necessary to treat or prevent imminent or life-threatening deterioration.  Critical care was time spent personally by me on the following activities: development of treatment plan with patient and/or surrogate as well as nursing, discussions with consultants, evaluation of patient's response to treatment, examination of patient, obtaining history from patient or surrogate, ordering and performing treatments and  interventions, ordering and review of laboratory studies, ordering and review of radiographic studies, pulse oximetry and re-evaluation of patient's condition.  Eliseo Gum MSN, AGACNP-BC [place] [phone] If no answer, [phone] 03/23/2020, 7:21 AM

## 2020-03-23 NOTE — Progress Notes (Signed)
Left upper extremity venous duplex has been completed. Preliminary results can be found in CV Proc through chart review.  Results were given to the patient's nurse, Megan.  03/23/20 11:42 AM Morgan Beasley RVT

## 2020-03-23 NOTE — Progress Notes (Signed)
Inpatient Diabetes Program Recommendations  AACE/ADA: New Consensus Statement on Inpatient Glycemic Control (2015)  Target Ranges:  Prepandial:   less than 140 mg/dL      Peak postprandial:   less than 180 mg/dL (1-2 hours)      Critically ill patients:  140 - 180 mg/dL   Lab Results  Component Value Date   GLUCAP 90 03/23/2020   HGBA1C 15.1 (H) 02/23/2020    Review of Glycemic Control Results for Morgan Beasley, Morgan Beasley (MRN [id number]) as of 03/23/2020 13:12  Ref. Range 03/22/2020 21:39 03/23/2020 07:59 03/23/2020 12:02 03/23/2020 12:04 03/23/2020 12:40  Glucose-Capillary Latest Ref Range: 70 - 99 mg/dL 73 Lantus 20units 76 57 (L) 63 (L) 90   Diabetes history: T2DM Outpatient Diabetes medications:  Farxiga 10 mg daily Lantus 80 units daily Humalog 14-20 units tid Current orders for Inpatient glycemic control:  Lantus 20 units qhs Novolog 0-15 units tid Novolog 0-5 units qhs  Inpatient Diabetes Program Recommendations:     Please consider Lantus 10 units qhs & Novolog 0-6 units tid  Will continue to follow while inpatient.  Thank you, Reche Dixon, RN, BSN Diabetes Coordinator Inpatient Diabetes Program [phone] (team pager from 8a-5p)

## 2020-03-23 NOTE — TOC Progression Note (Signed)
Transition of Care [facility]) - Progression Note    Patient Details  Name: Carneshia Raker MRN: [id number] Date of Birth: 1960-07-05  Transition of Care [facility]) CM/SW Contact  [name], RN Phone Number: 03/23/2020, 11:32 AM  Clinical Narrative:    Patient may need geri-psych placment will wait on eval by psych.   Expected Discharge Plan: [place] Barriers to Discharge: No Barriers Identified  Expected Discharge Plan and Services Expected Discharge Plan: [place]   Discharge Planning Services: CM Consult Post Acute Care Choice: [place] Living arrangements for the past 2 months: [place] Expected Discharge Date:  (unknown)                                     Social Determinants of Health (SDOH) Interventions    Readmission Risk Interventions Readmission Risk Prevention Plan 03/18/2020 10/21/2019  Transportation Screening Complete Complete  Medication Review Press photographer) - Complete  PCP or Specialist appointment within 3-5 days of discharge Not Complete Complete  PCP/Specialist Appt Not Complete comments First available appt 12/1 -  [place] or Home Care Consult Complete Complete  SW Recovery Care/Counseling Consult Complete Complete  Palliative Care Screening Not Applicable Not [place] Not Applicable Not Applicable  Some recent data might be hidden

## 2020-03-23 NOTE — Consult Note (Signed)
Today on evaluation patient was just returning from MRI of the brain. She remains on Precedex at 0.81mcg. Per her nurse who also provided care for her today. SHe states she is doing much better. " She is out of restraints today. Not as confused. I had to o up on her precedex to get through MRI. But she is doing better. "  She appears to be resting well at this time. She does not appear to be exhibiting overt agitation and or aggression at this time. Will continue her current medications as scheduled.  -MRI results shows [place] of evolving right and chronic left occipital infarcts,  Remote left basal ganglia and left thalamic microhemorrhages. Mild chronic microvascular ischemic changes. These results could explain her neurobehavioral disturbances, agitation and aggression. She appears to have some improvement in her symptoms at this time. Will continue with current recommendations.   -Will recommend starting olanzapine 2.5mg  po qhs prn for agitation and behavioral disturbances if warranted.  -Will continue Tegretol 200mg  po BID for behavioral disturbances, good choice of medication for post CVA agitation, aggression and delirium.  -Continue Buspar 10mg  po BID for anxiety.  Will dc psych consult at this time. Please re-consult if needed or once stable and no longer on sedation.

## 2020-03-23 NOTE — Progress Notes (Signed)
PROGRESS NOTE  Morgan Beasley  EHU:[id number] DOB: 1961-05-01 DOA: 03/21/2020 PCP: Riki Sheer, NP   Brief Narrative: Morgan Beasley is a 59 y.o. 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 She has been found to have pleural effusions, pulmonary edema for which diuresis has been started, and severe and waxing/waning agitated delirium requiring precedex infusion in ICU.  Assessment & Plan: Active Problems:   Anasarca  Agitated delirium: Recurrent issue for this patient, likely multifactorial. - Continue precedex, minimize use of physical restraints unless unable to titrate up precedex. Appreciate PCCM assistance. - At last hospitalization, pt's mentation was said to have improved on buspar, tegretol, and qHS zyprexa. Also started amitriptyline qHS. Psychiatry recommended increasing carbamazepine dose to 200mg  BID which has been administered. - Hoping to minimize QT prolonging medications  - Stopped cefepime.  - IVC, psychiatry consulted.  Acute hypoxic respiratory failure due to pulmonary edema and pleural effusions due to acute CHF on chronic OSA, tobacco use, COPD:  - Augment diuresis this AM based on CXR demonstrating continued/slightly more prominent effusions and pulmonary edema on my personal review of CXR this AM.  - Continue bronchodilators.  Acute on chronic combined HFrEF, HTN, anasarca: LVEF 40-45%, G2DD). BNP 1352.   - Increase to lasix 80mg  IV daily, will need close Cr monitoring.  - Continue metoprolol succinate - Monitor I/O, daily weights. Wt 217 > 216lbs thus far (assuming 192lbs in ED is inaccurate). Wt in July 2021 was 203lbs.   History of CAD, demand myocardial ischemia: Troponin mildly elevated with flat trend. No chest pain or STEMI on ECG.  - Continue  antiplatelet, BB, cardiac monitoring.   PVD with nonhealing left foot wound: Angiogram showed patent left fem-pop bypass during last hospitalization.  - Topical Tx's per WOC - Does not currently appear infected.   History of serratia bacteremia, pulmonic valve endocarditis: s/p 4 weeks cefepime as of 11/21. - Completed course of cefepime. Will monitor blood cultures here and remove PICC once pt less agitated.  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 Per psychiatry, this could explain behavioral changes outlined above. - Continue plavix, statin, risk factor modifications.   Chronic pancreatitis:  - Creon  Hypokalemia:  - Supplemented, continue monitoring  T2DM:  - Will stop SGLT2, decrease intensity of SSI, and decrease lantus even further due to low blood sugars.  Bacteriuria: only 1k colonies Enterococcus, no WBCs on micro.  Stage IIIa CKD: Based on recent lab values, nearly stage II. - Monitor BMP with diuresis.  Anemia of chronic disease: Baseline hgb in 8-9 range. Was hemoconcentrated on admission, now back to usual range. No bleeding noted.  - Monitor intermittently  Right supraclavicular and mediastinal adenopathyregion unchanged since October of 2020. Pt is s/p right mastectomy. Reactive vs. metastatic vs. lymphoproliferative.  - Radiology report on CTA chest/aorta from 1026 specifically mentions the right supraclavicular LN would be amenable to U/S-guided biopsy. This will need attention following this acute illness.   QT prolongation:  - Monitor on telemetry, minimize prolonging medications.  Hypocalcemia:  - Ionized calcium 1.11, supplement ordered  Obesity: Estimated body mass index is 35.01 kg/m as calculated from the following:   Height as of this encounter: 5\' 6"  (1.676 m).   Weight as of  this encounter: 98.4 kg.  DVT  prophylaxis: SCDs Code Status: Full Family Communication: None at bedside. Daughter by phone this PM. Disposition Plan:  Status is: Inpatient  Remains inpatient appropriate because:Altered mental status  Dispo: The patient is from: Home              Anticipated d/c is to: TBD based on clinical trajectory              Anticipated d/c date is: > 3 days              Patient currently is not medically stable to d/c.  Consultants:   PCCM  Procedures:   None  Antimicrobials:  Cefepime ending 11/21   Subjective: Agitation overnight improved with precedex gtt this morning. Denies any pain or shortness of breath, though history limited by drowsiness. Sitter at bedside reports agitation overnight.   Objective: Vitals:   03/23/20 1000 03/23/20 1001 03/23/20 1058 03/23/20 1200  BP:   (!) 148/90   Pulse: 88 80 81   Resp: 12 (!) 35 (!) 28   Temp:    97.7 F (36.5 C)  TempSrc:    Oral  SpO2: (!) 85% 92% 100%   Weight:      Height:        Intake/Output Summary (Last 24 hours) at 03/23/2020 1249 Last data filed at 03/23/2020 1206 Gross per 24 hour  Intake 541.97 ml  Output 600 ml  Net -58.03 ml   Filed Weights   03/22/20 0427 03/22/20 0532 03/23/20 0500  Weight: 101 kg 98.3 kg 98.4 kg   Gen: 59 y.o. female in no distress Pulm: Nonlabored tachypnea. Diminished bilaterally. CV: Regular rate and rhythm. No murmur, rub, or gallop. UTD JVD, trace LE edema and LUE edema surrounding PICC. GI: Abdomen soft, non-tender, non-distended, with normoactive bowel sounds.  Ext: Warm, no deformities Skin: No new rashes, lesions or ulcers on visualized skin. Neuro: Drowsy but rousable, not able to cooperate with full exam, though follows some commands.   Psych: Labile emotions, impaired judgment.   Data Reviewed: I have personally reviewed following labs and imaging studies  CBC: Recent Labs  Lab 03/21/20 0748 03/21/20 0758 03/22/20 0355 03/23/20 0516  WBC 8.9  --  8.7 6.6   NEUTROABS 6.2  --   --   --   HGB 10.7* 12.6 9.6* 9.1*  HCT 35.0* 37.0 32.0* 30.6*  MCV 79.2*  --  81.0 82.7  PLT 382  --  364 683   Basic Metabolic Panel: Recent Labs  Lab 03/18/20 0500 03/21/20 0748 03/21/20 0758 03/22/20 0355 03/23/20 0516  NA 140 141 142 139 141  K 3.9 3.4* 3.4* 3.5 4.1  CL 100 100 98 99 103  CO2 32 28  --  28 28  GLUCOSE 195* 100* 99 123* 93  BUN 24* 17 17 16  22*  CREATININE 1.32* 0.96 1.00 1.08* 1.48*  CALCIUM 8.6* 8.4*  --  8.3* 7.9*   GFR: Estimated Creatinine Clearance: 49 mL/min (A) (by C-G formula based on SCr of 1.48 mg/dL (H)). Liver Function Tests: Recent Labs  Lab 03/21/20 0748 03/22/20 0355 03/23/20 0516  AST 31 31 23   ALT 22 22 19   ALKPHOS 203* 188* 158*  BILITOT 0.9 0.7 0.6  PROT 7.1 6.9 6.4*  ALBUMIN 2.4* 2.4* 2.2*   No results for input(s): LIPASE, AMYLASE in the last 168 hours. Recent Labs  Lab 03/22/20 1645  AMMONIA 26   Coagulation Profile: Recent Labs  Lab 03/21/20  0748  INR 1.4*   Cardiac Enzymes: No results for input(s): CKTOTAL, CKMB, CKMBINDEX, TROPONINI in the last 168 hours. BNP (last 3 results) Recent Labs    12/19/19 1546 02/09/20 0825  PROBNP 1,148* 1,185*   HbA1C: No results for input(s): HGBA1C in the last 72 hours. CBG: Recent Labs  Lab 03/22/20 2139 03/23/20 0759 03/23/20 1202 03/23/20 1204 03/23/20 1240  GLUCAP 73 76 57* 63* 90   Lipid Profile: No results for input(s): CHOL, HDL, LDLCALC, TRIG, CHOLHDL, LDLDIRECT in the last 72 hours. Thyroid Function Tests: Recent Labs    03/22/20 1645  TSH 3.728   Anemia Panel: Recent Labs    03/22/20 1645  VITAMINB12 916*   Urine analysis:    Component Value Date/Time   COLORURINE STRAW (A) 03/21/2020 0730   APPEARANCEUR CLEAR 03/21/2020 0730   LABSPEC 1.006 03/21/2020 0730   PHURINE 8.0 03/21/2020 0730   GLUCOSEU NEGATIVE 03/21/2020 0730   HGBUR SMALL (A) 03/21/2020 0730   BILIRUBINUR NEGATIVE 03/21/2020 0730   KETONESUR NEGATIVE  03/21/2020 0730   PROTEINUR 100 (A) 03/21/2020 0730   NITRITE NEGATIVE 03/21/2020 0730   LEUKOCYTESUR NEGATIVE 03/21/2020 0730   Recent Results (from the past 240 hour(s))  Urine culture     Status: Abnormal   Collection Time: 03/21/20  7:30 AM   Specimen: In/Out Cath Urine  Result Value Ref Range Status   Specimen Description   Final    IN/OUT CATH URINE Performed at [facility], [place] [address], [place], [place] [address]    Special Requests   Final    NONE Performed at [facility], [place] [address] [place], [place] [address]    Culture 1,000 COLONIES/mL ENTEROCOCCUS FAECIUM (A)  Final   Report Status 03/23/2020 FINAL  Final   Organism ID, Bacteria ENTEROCOCCUS FAECIUM (A)  Final      Susceptibility   Enterococcus faecium - MIC*    AMPICILLIN RESISTANT Resistant     NITROFURANTOIN 64 INTERMEDIATE Intermediate     VANCOMYCIN 1 SENSITIVE Sensitive     * 1,000 COLONIES/mL ENTEROCOCCUS FAECIUM  Resp Panel by RT-PCR (Flu A&B, Covid)     Status: None   Collection Time: 03/21/20  9:30 AM   Specimen: Nasopharyngeal(NP) swabs in vial transport medium  Result Value Ref Range Status   SARS Coronavirus 2 by RT PCR NEGATIVE NEGATIVE Final    Comment: (NOTE) SARS-CoV-2 target nucleic acids are NOT DETECTED.  The SARS-CoV-2 RNA is generally detectable in upper respiratory specimens during the acute phase of infection. The lowest concentration of SARS-CoV-2 viral copies this assay can detect is 138 copies/mL. A negative result does not preclude SARS-Cov-2 infection and should not be used as the sole basis for treatment or other patient management decisions. A negative result may occur with  improper specimen collection/handling, submission of specimen other than nasopharyngeal swab, presence of viral mutation(s) within the areas targeted by this assay, and inadequate number of viral copies(<138 copies/mL). A negative result must be combined  with clinical observations, patient history, and epidemiological information. The expected result is Negative.  Fact Sheet for Patients:  EntrepreneurPulse.com.au  Fact Sheet for Healthcare Providers:  IncredibleEmployment.be  This test is no t yet approved or cleared by the [place] FDA and  has been authorized for detection and/or diagnosis of SARS-CoV-2 by FDA under an Emergency Use Authorization (EUA). This EUA will remain  in effect (meaning this test can be used) for the duration of the COVID-19 declaration under  Section 564(b)(1) of the Act, 21 U.S.C.section 360bbb-3(b)(1), unless the authorization is terminated  or revoked sooner.       Influenza A by PCR NEGATIVE NEGATIVE Final   Influenza B by PCR NEGATIVE NEGATIVE Final    Comment: (NOTE) The Xpert Xpress SARS-CoV-2/FLU/RSV plus assay is intended as an aid in the diagnosis of influenza from Nasopharyngeal swab specimens and should not be used as a sole basis for treatment. Nasal washings and aspirates are unacceptable for Xpert Xpress SARS-CoV-2/FLU/RSV testing.  Fact Sheet for Patients: EntrepreneurPulse.com.au  Fact Sheet for Healthcare Providers: IncredibleEmployment.be  This test is not yet approved or cleared by the [place] FDA and has been authorized for detection and/or diagnosis of SARS-CoV-2 by FDA under an Emergency Use Authorization (EUA). This EUA will remain in effect (meaning this test can be used) for the duration of the COVID-19 declaration under Section 564(b)(1) of the Act, 21 U.S.C. section 360bbb-3(b)(1), unless the authorization is terminated or revoked.  Performed at [facility], [place] [address] [place], [place] [address]   Blood culture (routine x 2)     Status: None (Preliminary result)   Collection Time: 03/21/20  9:44 AM   Specimen: BLOOD LEFT HAND  Result Value Ref Range Status    Specimen Description   Final    BLOOD LEFT HAND Performed at [place] [address], [place], [place] [address]    Special Requests   Final    BOTTLES DRAWN AEROBIC AND ANAEROBIC Blood Culture results may not be optimal due to an inadequate volume of blood received in culture bottles Performed at [place] [address] [place], [place] [address]    Culture   Final    NO GROWTH 2 DAYS Performed at [place] [address] [place], [place] [address]    Report Status PENDING  Incomplete  MRSA PCR Screening     Status: None   Collection Time: 03/22/20  5:26 AM   Specimen: Nasopharyngeal  Result Value Ref Range Status   MRSA by PCR NEGATIVE NEGATIVE Final    Comment:        The GeneXpert MRSA Assay (FDA approved for NASAL specimens only), is one component of a comprehensive MRSA colonization surveillance program. It is not intended to diagnose MRSA infection nor to guide or monitor treatment for MRSA infections. Performed at [facility], [place] [address] [place], [place] [address]       Radiology Studies: DG Chest 1 View  Result Date: 03/22/2020 CLINICAL DATA:  Worsening altered mental status and shortness of breath. EXAM: CHEST  1 VIEW COMPARISON:  Chest radiograph and CT 03/21/2020 FINDINGS: The cardiac silhouette remains mildly enlarged. A left PICC terminates over the mid SVC, unchanged. Interstitial and basilar predominant airspace opacities are stable to minimally increased compared to yesterday's radiograph. There is a persistent small right pleural effusion. No pneumothorax is identified. IMPRESSION: Stable to minimally increased bilateral lung opacities likely reflecting edema. Persistent small right pleural effusion. Electronically Signed   By: Logan Bores M.D.   On: 03/22/2020 07:48   MR BRAIN WO CONTRAST  Result Date: 03/23/2020 CLINICAL DATA:  Transient ischemic attack, neuro deficit,  acute, stroke suspected. EXAM: MRI HEAD WITHOUT CONTRAST TECHNIQUE: Multiplanar, multiecho pulse sequences of the brain and surrounding structures were obtained without intravenous contrast. COMPARISON:  03/21/2020 head CT.  02/29/2020 MRI/MRA head. FINDINGS: Please note that image quality is degraded by motion artifact. Brain: Sequela of  evolving right occipital infarct with associated FLAIR hyperintensity and cortical laminar necrosis. Chronic left occipital insult. No new focal restricted diffusion. Remote microhemorrhages involving the left basal ganglia, left thalamus. No midline shift, ventriculomegaly or extra-axial fluid collection. No mass lesion. Mild chronic microvascular ischemic changes. Vascular: Normal flow voids. Skull and upper cervical spine: Normal marrow signal. Sinuses/Orbits: Normal orbits. Clear paranasal sinuses. No mastoid effusion. Other: None. IMPRESSION: No acute intracranial process. Sequela of evolving right and chronic left occipital infarcts. Remote left basal ganglia and left thalamic microhemorrhages. Mild chronic microvascular ischemic changes. Electronically Signed   By: Primitivo Gauze M.D.   On: 03/23/2020 10:16   DG CHEST PORT 1 VIEW  Result Date: 03/23/2020 CLINICAL DATA:  Pleural effusion. EXAM: PORTABLE CHEST 1 VIEW COMPARISON:  Chest x-ray 03/22/2020. FINDINGS: Cardiomegaly again noted. Diffuse bilateral pulmonary infiltrates/edema and bilateral pleural effusions again noted. Slight progression from prior exam. No pneumothorax. IMPRESSION: Cardiomegaly. Diffuse bilateral pulmonary infiltrates/edema and bilateral pleural effusions with slight progression on today's exam. Electronically Signed   By: Marcello Moores  Register   On: 03/23/2020 06:27   VAS [place] UPPER EXTREMITY VENOUS DUPLEX  Result Date: 03/23/2020 UPPER VENOUS STUDY  Indications: Edema Limitations: Poor ultrasound/tissue interface, bandages and line. Comparison Study: 14/43/1540 - Left cephalic thrombus.  Performing Technologist: Oliver Hum RVT  Examination Guidelines: A complete evaluation includes B-mode imaging, spectral Doppler, color Doppler, and power Doppler as needed of all accessible portions of each vessel. Bilateral testing is considered an integral part of a complete examination. Limited examinations for reoccurring indications may be performed as noted.  Right Findings: +----------+------------+---------+-----------+----------+-------+ RIGHT     CompressiblePhasicitySpontaneousPropertiesSummary +----------+------------+---------+-----------+----------+-------+ Subclavian    Full       Yes       Yes                      +----------+------------+---------+-----------+----------+-------+  Left Findings: +----------+------------+---------+-----------+----------+-----------------+ LEFT      CompressiblePhasicitySpontaneousProperties     Summary      +----------+------------+---------+-----------+----------+-----------------+ IJV           Full       Yes       Yes                                +----------+------------+---------+-----------+----------+-----------------+ Subclavian    Full       Yes       Yes                                +----------+------------+---------+-----------+----------+-----------------+ Axillary      Full       Yes       Yes                                +----------+------------+---------+-----------+----------+-----------------+ Brachial      Full       Yes       Yes                                +----------+------------+---------+-----------+----------+-----------------+ Radial        Full                                                    +----------+------------+---------+-----------+----------+-----------------+  Ulnar         Full                                                    +----------+------------+---------+-----------+----------+-----------------+ Cephalic      None                                   Age Indeterminate +----------+------------+---------+-----------+----------+-----------------+ Basilic       Full                                                    +----------+------------+---------+-----------+----------+-----------------+  Summary:  Right: No evidence of thrombosis in the subclavian.  Left: No evidence of deep vein thrombosis in the upper extremity. Findings consistent with age indeterminate superficial vein thrombosis involving the left cephalic vein. Cephalic thrombus appears unchanged from the previous study performed on 03/12/2020.  *See table(s) above for measurements and observations.    Preliminary     Scheduled Meds: . amitriptyline  100 mg Oral QHS  . atorvastatin  20 mg Oral Daily  . busPIRone  10 mg Oral BID  . carbamazepine  200 mg Oral BID  . chlorhexidine  15 mL Mouth Rinse BID  . Chlorhexidine Gluconate Cloth  6 each Topical Daily  . clopidogrel  75 mg Oral Daily  . collagenase   Topical Daily  . furosemide  80 mg Intravenous Daily  . insulin aspart  0-15 Units Subcutaneous TID WC  . insulin aspart  0-5 Units Subcutaneous QHS  . insulin glargine  20 Units Subcutaneous QHS  . lipase/protease/amylase  72,000 Units Oral TID WC  . mouth rinse  15 mL Mouth Rinse q12n4p  . metoprolol succinate  50 mg Oral Daily  . mometasone-formoterol  2 puff Inhalation BID  . OLANZapine  2.5 mg Oral QHS  . potassium chloride  40 mEq Oral BID   Continuous Infusions: . dexmedetomidine (PRECEDEX) IV infusion 0.2 mcg/kg/hr (03/23/20 1238)     LOS: 2 days   Time spent: 35 minutes.  Patrecia Pour, MD [facility] [email or web address] 03/23/2020, 12:49 PM

## 2020-03-24 DIAGNOSIS — G9341 Metabolic encephalopathy: Secondary | ICD-10-CM | POA: Diagnosis present

## 2020-03-24 LAB — GLUCOSE, CAPILLARY
Glucose-Capillary: 111 mg/dL — ABNORMAL HIGH (ref 70–99)
Glucose-Capillary: 136 mg/dL — ABNORMAL HIGH (ref 70–99)
Glucose-Capillary: 171 mg/dL — ABNORMAL HIGH (ref 70–99)
Glucose-Capillary: 159 mg/dL — ABNORMAL HIGH (ref 70–99)

## 2020-03-24 LAB — BASIC METABOLIC PANEL
Anion gap: 12 (ref 5–15)
BUN: 28 mg/dL — ABNORMAL HIGH (ref 6–20)
CO2: 27 mmol/L (ref 22–32)
Calcium: 8.2 mg/dL — ABNORMAL LOW (ref 8.9–10.3)
Chloride: 103 mmol/L (ref 98–111)
Creatinine, Ser: 1.67 mg/dL — ABNORMAL HIGH (ref 0.44–1.00)
GFR, Estimated: 35 mL/min — ABNORMAL LOW (ref >60)
Glucose, Bld: 148 mg/dL — ABNORMAL HIGH (ref 70–99)
Potassium: 4.5 mmol/L (ref 3.5–5.1)
Sodium: 142 mmol/L (ref 135–145)

## 2020-03-24 LAB — BRAIN NATRIURETIC PEPTIDE: B Natriuretic Peptide: 989.2 pg/mL — ABNORMAL HIGH (ref 0.0–100.0)

## 2020-03-24 LAB — MAGNESIUM: Magnesium: 1.5 mg/dL — ABNORMAL LOW (ref 1.7–2.4)

## 2020-03-24 MED ORDER — MAGNESIUM SULFATE IN D5W 1-5 GM/100ML-% IV SOLN
1.0000 g | Freq: Once | INTRAVENOUS | Status: AC
  Administered 2020-03-24: 1 g via INTRAVENOUS
  Filled 2020-03-24: qty 100

## 2020-03-24 NOTE — Progress Notes (Signed)
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  She was found to have pleural effusions, pulmonary edema.  Waxing and waning agitation delirium requiring Precedex in ICU. 11/23, Precedex off.  On multiple antipsychotic medications.  Remains agitated intermittently.   Assessment & Plan:   Active Problems:   Anasarca  Agitated delirium/multiple medical issues and complicated hospitalizations related to metabolic encephalopathy: Difficult to control symptoms.  Currently able to come off the Precedex drip.  Appreciate psychiatry input. All-time fall precautions.  Delirium precautions.  Air cabin crew.  Renew soft restraints as much needed to keep IV lines and tubing safe as well as to keep patient safe. Currently on BuSpar Tegretol Nighttime Zyprexa Nighttime amitriptyline Low-dose Ativan every 6 hours. Antibiotics discontinued. Continue supportive treatment, day and night cycle regulation.   Acute hypoxemic respiratory failure secondary to multifactorial pulmonary edema and pleural effusions, acute CHF on chronic obstructive sleep apnea, underlying history of COPD: Intermittently diuresing.  Continue bronchodilators.  Continue IV Lasix 80 mg daily.  Check output.  On metoprolol succinate. Known ejection fraction of 40%.  History of coronary artery disease: She remains on Plavix, beta-blockers.  Currently no evidence of acute coronary syndrome.  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  No evidence of new stroke.  Remains on Plavix a statin.  Chronic pancreatitis: On Creon.  Electrolyte abnormalities: Replaced and adequate.  Replace magnesium aggressively today.  Mag rider 2 g IV.  Type 2 diabetes: Controlled.  Remains on insulin in the hospital.  On oral hypoglycemics at home.  Blood sugars are fairly controlled today on Lantus and sliding scale.  Acute kidney injury on chronic kidney disease stage IIIa: Renal function fluctuates.  Need close monitoring on dialysis.  Anemia of chronic disease: Baseline hemoglobin about 8-9.  No evidence of bleeding.  Right supraclavicular and mediastinal adenopathy: Will need outpatient follow-up once patient is more stabilized.  Prolonged QTC: QTC is 518.  Replace electrolytes and closely monitor.  We will keep on telemetry unit.  Currently needing to use medications including Zyprexa low-dose at night.  Bowel and bladder functions adequate.  Reportedly stool 2 times yesterday.   DVT prophylaxis: SCDs Start: 03/21/20 1629   Code Status: Full code. Family Communication: We will call. Disposition Plan: Status is: Inpatient  Remains inpatient appropriate because:Unsafe d/c plan and Inpatient level of care appropriate due to severity of illness   Dispo: The patient is from: Home              Anticipated d/c is to: SNF              Anticipated d/c date is: > 3 days              Patient currently is not medically stable to d/c.         Consultants:   PCCM  ID  Procedures:   None  Antimicrobials:   None   Subjective: Patient seen and examined.  Overnight intermittently agitated.  Off Precedex drip since 11/23 afternoon.  Safety sitter at bedside.  Patient herself was sleepy.  She will wake up to a stimulation and answer few  questions otherwise do not interact.  Nursing reported no other overnight events.  They need soft restraints because she  keeps reaching to the things and lines.  Objective: Vitals:   03/24/20 0500 03/24/20 0600 03/24/20 0730 03/24/20 0800  BP:  119/87    Pulse:  (!) 101    Resp:  (!) 23    Temp:   97.9 F (36.6 C) 97.9 F (36.6 C)  TempSrc:   Axillary Oral  SpO2: 100% 99%    Weight: 97.1 kg     Height:        Intake/Output Summary (Last 24 hours) at 03/24/2020 0160 Last data filed at 03/24/2020 1093 Gross per 24 hour  Intake 716.95 ml  Output 280 ml  Net 436.95 ml   Filed Weights   03/22/20 0532 03/23/20 0500 03/24/20 0500  Weight: 98.3 kg 98.4 kg 97.1 kg    Examination:  General exam: Chronically sick looking female, not in any obvious distress but drowsy and minimally interactive. Easily distractible.  Follows simple commands. Alert on stimulation however not oriented. Anxious and agitated at times. Respiratory system: Clear to auscultation. Respiratory effort normal. Cardiovascular system: S1 & S2 heard, RRR. Gastrointestinal system: Soft and nontender.   Central nervous system: No focal logical deficit. Extremities: Moves all extremities. Skin: Chronic venous stasis changes on both legs.  No open ulcers.    Data Reviewed: I have personally reviewed following labs and imaging studies  CBC: Recent Labs  Lab 03/21/20 0748 03/21/20 0758 03/22/20 0355 03/23/20 0516  WBC 8.9  --  8.7 6.6  NEUTROABS 6.2  --   --   --   HGB 10.7* 12.6 9.6* 9.1*  HCT 35.0* 37.0 32.0* 30.6*  MCV 79.2*  --  81.0 82.7  PLT 382  --  364 235   Basic Metabolic Panel: Recent Labs  Lab 03/18/20 0500 03/18/20 0500 03/21/20 0748 03/21/20 0758 03/22/20 0355 03/23/20 0516 03/24/20 0156  NA 140   < > 141 142 139 141 142  K 3.9   < > 3.4* 3.4* 3.5 4.1 4.5  CL 100   < > 100 98 99 103 103  CO2 32  --  28  --  28 28 27   GLUCOSE 195*   < > 100* 99 123* 93 148*  BUN 24*   < > 17 17 16  22* 28*  CREATININE 1.32*   < > 0.96 1.00 1.08* 1.48* 1.67*  CALCIUM 8.6*  --  8.4*  --  8.3* 7.9* 8.2*  MG  --    --   --   --   --   --  1.5*   < > = values in this interval not displayed.   GFR: Estimated Creatinine Clearance: 43.1 mL/min (A) (by C-G formula based on SCr of 1.67 mg/dL (H)). Liver Function Tests: Recent Labs  Lab 03/21/20 0748 03/22/20 0355 03/23/20 0516  AST 31 31 23   ALT 22 22 19   ALKPHOS 203* 188* 158*  BILITOT 0.9 0.7 0.6  PROT 7.1 6.9 6.4*  ALBUMIN 2.4* 2.4* 2.2*   No results for input(s): LIPASE, AMYLASE in the last 168 hours. Recent Labs  Lab 03/22/20 1645  AMMONIA 26   Coagulation Profile: Recent Labs  Lab 03/21/20 0748  INR 1.4*   Cardiac Enzymes: No results for input(s): CKTOTAL, CKMB, CKMBINDEX, TROPONINI in the last 168 hours. BNP (last 3 results) Recent Labs  12/19/19 1546 02/09/20 0825  PROBNP 1,148* 1,185*   HbA1C: No results for input(s): HGBA1C in the last 72 hours. CBG: Recent Labs  Lab 03/23/20 1204 03/23/20 1240 03/23/20 1522 03/23/20 2111 03/24/20 0728  GLUCAP 63* 90 82 111* 136*   Lipid Profile: No results for input(s): CHOL, HDL, LDLCALC, TRIG, CHOLHDL, LDLDIRECT in the last 72 hours. Thyroid Function Tests: Recent Labs    03/22/20 1645  TSH 3.728   Anemia Panel: Recent Labs    03/22/20 1645  VITAMINB12 916*   Sepsis Labs: Recent Labs  Lab 03/21/20 0748  LATICACIDVEN 1.4    Recent Results (from the past 240 hour(s))  Urine culture     Status: Abnormal   Collection Time: 03/21/20  7:30 AM   Specimen: In/Out Cath Urine  Result Value Ref Range Status   Specimen Description   Final    IN/OUT CATH URINE Performed at [facility], [place] [address] [place], [place] [address]    Special Requests   Final    NONE Performed at [facility], [place] [address] [place], [place] [address]    Culture 1,000 COLONIES/mL ENTEROCOCCUS FAECIUM (A)  Final   Report Status 03/23/2020 FINAL  Final   Organism ID, Bacteria ENTEROCOCCUS FAECIUM (A)  Final      Susceptibility   Enterococcus  faecium - MIC*    AMPICILLIN RESISTANT Resistant     NITROFURANTOIN 64 INTERMEDIATE Intermediate     VANCOMYCIN 1 SENSITIVE Sensitive     * 1,000 COLONIES/mL ENTEROCOCCUS FAECIUM  Resp Panel by RT-PCR (Flu A&B, Covid)     Status: None   Collection Time: 03/21/20  9:30 AM   Specimen: Nasopharyngeal(NP) swabs in vial transport medium  Result Value Ref Range Status   SARS Coronavirus 2 by RT PCR NEGATIVE NEGATIVE Final    Comment: (NOTE) SARS-CoV-2 target nucleic acids are NOT DETECTED.  The SARS-CoV-2 RNA is generally detectable in upper respiratory specimens during the acute phase of infection. The lowest concentration of SARS-CoV-2 viral copies this assay can detect is 138 copies/mL. A negative result does not preclude SARS-Cov-2 infection and should not be used as the sole basis for treatment or other patient management decisions. A negative result may occur with  improper specimen collection/handling, submission of specimen other than nasopharyngeal swab, presence of viral mutation(s) within the areas targeted by this assay, and inadequate number of viral copies(<138 copies/mL). A negative result must be combined with clinical observations, patient history, and epidemiological information. The expected result is Negative.  Fact Sheet for Patients:  EntrepreneurPulse.com.au  Fact Sheet for Healthcare Providers:  IncredibleEmployment.be  This test is no t yet approved or cleared by the [place] FDA and  has been authorized for detection and/or diagnosis of SARS-CoV-2 by FDA under an Emergency Use Authorization (EUA). This EUA will remain  in effect (meaning this test can be used) for the duration of the COVID-19 declaration under Section 564(b)(1) of the Act, 21 U.S.C.section 360bbb-3(b)(1), unless the authorization is terminated  or revoked sooner.       Influenza A by PCR NEGATIVE NEGATIVE Final   Influenza B by PCR NEGATIVE  NEGATIVE Final    Comment: (NOTE) The Xpert Xpress SARS-CoV-2/FLU/RSV plus assay is intended as an aid in the diagnosis of influenza from Nasopharyngeal swab specimens and should not be used as a sole basis for treatment. Nasal washings and aspirates are unacceptable for Xpert Xpress SARS-CoV-2/FLU/RSV testing.  Fact Sheet for Patients: EntrepreneurPulse.com.au  Fact Sheet  for Healthcare Providers: IncredibleEmployment.be  This test is not yet approved or cleared by the [place] and has been authorized for detection and/or diagnosis of SARS-CoV-2 by FDA under an Emergency Use Authorization (EUA). This EUA will remain in effect (meaning this test can be used) for the duration of the COVID-19 declaration under Section 564(b)(1) of the Act, 21 U.S.C. section 360bbb-3(b)(1), unless the authorization is terminated or revoked.  Performed at [facility], [place] [address], [place], [place] [address]   Blood culture (routine x 2)     Status: None (Preliminary result)   Collection Time: 03/21/20  9:44 AM   Specimen: BLOOD LEFT HAND  Result Value Ref Range Status   Specimen Description   Final    BLOOD LEFT HAND Performed at [place] [address], [place], [place] [address]    Special Requests   Final    BOTTLES DRAWN AEROBIC AND ANAEROBIC Blood Culture results may not be optimal due to an inadequate volume of blood received in culture bottles Performed at [place] [address], [place], [place] [address]    Culture   Final    NO GROWTH 3 DAYS Performed at [facility], [place] [address], [place], [place] [address]    Report Status PENDING  Incomplete  MRSA PCR Screening     Status: None   Collection Time: 03/22/20  5:26 AM   Specimen: Nasopharyngeal  Result Value Ref Range Status   MRSA by PCR NEGATIVE NEGATIVE Final    Comment:        The GeneXpert MRSA Assay  (FDA approved for NASAL specimens only), is one component of a comprehensive MRSA colonization surveillance program. It is not intended to diagnose MRSA infection nor to guide or monitor treatment for MRSA infections. Performed at [facility], [place] [address], [place],  [address]          Radiology Studies: MR BRAIN WO CONTRAST  Result Date: 03/23/2020 CLINICAL DATA:  Transient ischemic attack, neuro deficit, acute, stroke suspected. EXAM: MRI HEAD WITHOUT CONTRAST TECHNIQUE: Multiplanar, multiecho pulse sequences of the brain and surrounding structures were obtained without intravenous contrast. COMPARISON:  03/21/2020 head CT.  02/29/2020 MRI/MRA head. FINDINGS: Please note that image quality is degraded by motion artifact. Brain: Sequela of evolving right occipital infarct with associated FLAIR hyperintensity and cortical laminar necrosis. Chronic left occipital insult. No new focal restricted diffusion. Remote microhemorrhages involving the left basal ganglia, left thalamus. No midline shift, ventriculomegaly or extra-axial fluid collection. No mass lesion. Mild chronic microvascular ischemic changes. Vascular: Normal flow voids. Skull and upper cervical spine: Normal marrow signal. Sinuses/Orbits: Normal orbits. Clear paranasal sinuses. No mastoid effusion. Other: None. IMPRESSION: No acute intracranial process. Sequela of evolving right and chronic left occipital infarcts. Remote left basal ganglia and left thalamic microhemorrhages. Mild chronic microvascular ischemic changes. Electronically Signed   By: Primitivo Gauze M.D.   On: 03/23/2020 10:16   DG CHEST PORT 1 VIEW  Result Date: 03/23/2020 CLINICAL DATA:  Pleural effusion. EXAM: PORTABLE CHEST 1 VIEW COMPARISON:  Chest x-ray 03/22/2020. FINDINGS: Cardiomegaly again noted. Diffuse bilateral pulmonary infiltrates/edema and bilateral pleural effusions again noted. Slight progression from prior exam. No  pneumothorax. IMPRESSION: Cardiomegaly. Diffuse bilateral pulmonary infiltrates/edema and bilateral pleural effusions with slight progression on today's exam. Electronically Signed   By: [place]   On: 03/23/2020 06:27   VAS [place] UPPER EXTREMITY VENOUS DUPLEX  Result Date: 03/23/2020  UPPER VENOUS STUDY  Indications: Edema Limitations: Poor ultrasound/tissue interface, bandages and line. Comparison Study: 48/25/0037 - Left cephalic thrombus. Performing Technologist: Oliver Hum RVT  Examination Guidelines: A complete evaluation includes B-mode imaging, spectral Doppler, color Doppler, and power Doppler as needed of all accessible portions of each vessel. Bilateral testing is considered an integral part of a complete examination. Limited examinations for reoccurring indications may be performed as noted.  Right Findings: +----------+------------+---------+-----------+----------+-------+ RIGHT     CompressiblePhasicitySpontaneousPropertiesSummary +----------+------------+---------+-----------+----------+-------+ Subclavian    Full       Yes       Yes                      +----------+------------+---------+-----------+----------+-------+  Left Findings: +----------+------------+---------+-----------+----------+-----------------+ LEFT      CompressiblePhasicitySpontaneousProperties     Summary      +----------+------------+---------+-----------+----------+-----------------+ IJV           Full       Yes       Yes                                +----------+------------+---------+-----------+----------+-----------------+ Subclavian    Full       Yes       Yes                                +----------+------------+---------+-----------+----------+-----------------+ Axillary      Full       Yes       Yes                                +----------+------------+---------+-----------+----------+-----------------+ Brachial      Full       Yes       Yes                                 +----------+------------+---------+-----------+----------+-----------------+ Radial        Full                                                    +----------+------------+---------+-----------+----------+-----------------+ Ulnar         Full                                                    +----------+------------+---------+-----------+----------+-----------------+ Cephalic      None                                  Age Indeterminate +----------+------------+---------+-----------+----------+-----------------+ Basilic       Full                                                    +----------+------------+---------+-----------+----------+-----------------+  Summary:  Right: No evidence of thrombosis in the subclavian.  Left:  No evidence of deep vein thrombosis in the upper extremity. Findings consistent with age indeterminate superficial vein thrombosis involving the left cephalic vein. Cephalic thrombus appears unchanged from the previous study performed on 03/12/2020.  *See table(s) above for measurements and observations.  Diagnosing physician: Deitra Mayo MD Electronically signed by Deitra Mayo MD on 03/23/2020 at 3:15:40 PM.    Final         Scheduled Meds: . amitriptyline  100 mg Oral QHS  . atorvastatin  20 mg Oral Daily  . busPIRone  10 mg Oral BID  . carbamazepine  200 mg Oral BID  . chlorhexidine  15 mL Mouth Rinse BID  . Chlorhexidine Gluconate Cloth  6 each Topical Daily  . clopidogrel  75 mg Oral Daily  . collagenase   Topical Daily  . docusate sodium  100 mg Oral BID  . furosemide  80 mg Intravenous Daily  . insulin aspart  0-5 Units Subcutaneous QHS  . insulin aspart  0-6 Units Subcutaneous TID WC  . insulin glargine  10 Units Subcutaneous QHS  . lipase/protease/amylase  72,000 Units Oral TID WC  . mouth rinse  15 mL Mouth Rinse q12n4p  . metoprolol succinate  50 mg Oral Daily  . mometasone-formoterol  2 puff  Inhalation BID  . OLANZapine  2.5 mg Oral QHS  . potassium chloride  40 mEq Oral BID   Continuous Infusions: . magnesium sulfate bolus IVPB       LOS: 3 days    Time spent: 40 minutes    Barb Merino, MD [facility] Pager [phone]

## 2020-03-24 NOTE — Progress Notes (Signed)
[facility] Progress Note Patient Name: Morgan Beasley DOB: [birth year] MRN: [id number]   Date of Service  03/24/2020  HPI/Events of Note  Notified of Mg 1.5, creatinine 1.67  eICU Interventions  Magnesium sulfate 1 g IV ordered     Intervention Category Minor Interventions: Electrolytes abnormality - evaluation and management  Judd Lien 03/24/2020, 5:03 AM

## 2020-03-24 NOTE — Progress Notes (Signed)
pT IS VERY SLEEPY AND WASN'T ABLE TO FOLLOW DIRECTION TO DO THE DULERA

## 2020-03-24 NOTE — Progress Notes (Signed)
NAME:  Morgan Beasley, MRN:  [id number], DOB:  01-Jun-1960, LOS: 3 ADMISSION DATE:  03/21/2020, CONSULTATION DATE:  11/22 REFERRING MD:  Bonner Puna, CHIEF COMPLAINT:  Agitated delirium    Brief History   59 year old female admitted 11/21 after just being discharged on 11/18 for agitated delirium, visual hallucinations and confusion.  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  Her ongoing acute delirium was a primary factor in prolonged hospital stay  History of present illness   This is a 59 year old female who was admitted on 11/21 as a hospital readmission following a recent discharge after being admitted for bacteremia due to Serratia felt secondary to a nonhealing foot wound.  This hospitalization was complicated by several issues including: Severe agitated delirium, catheter related pulmonic valve endocarditis, renal failure, and acute right occipital CVA. At time of discharge Her mental status and improved.  Per discharge summary was awake appropriate and cooperative.  Presented to the emergency room on 11/21 with chief complaint of delirium/confusion.  Apparently not long after returning home she again was noted to be confused, having visual hallucinations and was quite agitated.  Because of this she was brought back to the emergency room for further evaluation.  The emergency room she was given Zyprexa, initial evaluation showed her to be fairly hypertensive with systolic blood pressure 174/081 blood chemistry unremarkable urinalysis unremarkable admitted for further evaluation to the internal medicine service initial therapeutic interventions included: Continuation of amitriptyline, IV hydration, and as needed Ativan in addition to psychiatric consultation.  She had been  given as needed Ativan over the course of the evening on 11/22 however a security alert was called on 11/22 as the patient became aggressive and combative because of this she was transferred to the intensive care for Precedex infusion while she underwent further evaluation with plans to check ammonia level, MRI, and arterial blood gas.  She was started on low-dose Precedex but remained fairly agitated and because of this pulmonary/critical care was asked to evaluate.  Past Medical History  CKD stage II, combined systolic and diastolic heart failure, COPD, obstructive sleep apnea, diabetes, peripheral neuropathy, hyperlipidemia, gout, hypertension, history of polysubstance abuse, peripheral vascular disease.  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  Also course complicated by severe agitated delirium/acute encephalopathy.  Was seen by psychiatry and neurology during this stay started on Tegretol and BuSpar.  As well as continued on nighttime amitriptyline  Significant Hospital Events   11/18 discharged after prolonged stay->seemed to have improved w/ regimen of Zyprexa QHS PRN, tegretol and Buspar  11/21 re-admitted for on-going agitated delirium  11/22 critical care consulted for Precedex support   Consults:  Critical care 11/22   Procedures:    Significant Diagnostic Tests:  MRI 10/31 (last admit): Acute infarct of the lateral right occipital lobe with parietotemporal extension, chronic left occipital infarct, mild chronic microvascular ischemic changes no large vessel occlusion. 11/21 CT head: Negative for acute intracranial abnormality with unchanged right occipital infarct no new hemorrhage CT chest 11/21: Free fluid in the pelvis,  bilateral pleural effusions, pulmonary venous congestion with mild pulmonary edema, adenopathy of the right supraclavicular region, mediastinum, these  are chronic findings. MRI 11/23: sequela of evolving R and chronic L occipital infarcts   Micro Data:  11/21 urine culture Enterococcus faecium 11/21 blood cultures>>> Last admit 10/25 blood culture x2 Serratia, this is pansensitive.  Antimicrobials:  Cefepime stopped 11/21 (per plan)  Interim history/subjective:  Remains off of precedex  Mood disturbances improving with medication reg by psych   Objective   Blood pressure 135/90, pulse (!) 101, temperature 97.9 F (36.6 C), temperature source Oral, resp. rate (!) 29, height 5\' 6"  (1.676 m), weight 97.1 kg, SpO2 99 %.        Intake/Output Summary (Last 24 hours) at 03/24/2020 0930 Last data filed at 03/24/2020 7939 Gross per 24 hour  Intake 716.95 ml  Output 280 ml  Net 436.95 ml   Filed Weights   03/22/20 0532 03/23/20 0500 03/24/20 0500  Weight: 98.3 kg 98.4 kg 97.1 kg    Examination: General: chronically ill appearing middle aged F, asleep NAD  HENT: NCAT pink mm trachea midline  Lungs: Symmetrical chest expansion, diminished bibasilar sounds. No adventitious sounds  Cardiovascular: rrr s1s2 no rgm  Abdomen: Obese soft round ndnt  Extremities: BUE soft wrist restraints. LUE PICC, LUE edema  Neuro: Awakens to stimulation, drowsy  GU: defer  Skin: scattered scabbed lesions on BLE. Dry skin.  Psych: intermittently tearful when awakened   Resolved Hospital Problem list   Serratia bacteremia (completed 4 weeks cefepime 11/21)  Assessment & Plan:   Acute vs acute on chronic encephalopathy  -superimposed on subacute embolic CVA  -recurring hallucinations. I see in recent hospitalization w hallucinations, mood lability, tangential thoughts. Psych meds not continued at discharge. -MRI with sequela of evolving R and chronic L occipital infarcts  -behavior disturbances  have improved significant with resuming mood stabilizing meds  Plan -remains off of precedex -continue meds per psych  Acute hypoxic respiratory failure 2/2 pulmonary edema Hx COPD Plan -BDs -Supplemental O2 as needed for SpO2 88-92%  -IS   Acute on chronic combined heart failure (systolic EF 03% and grade II diastolic dysfxn) H/o CAD Plan  -cardiac monitoring -plavix, statin, beta blockade -Lasix   AKI on CKD II -uptrending Cr Plan -trend renal indices -minimize nephrotoxins -close monitoring with lasix dose   Chronic pancreatitis -Creon  Recent serratia bacteremia (felt 2/2 cellulitis) -PICC removed -BLE PIVs placed by PCCM (R 20g L 22g)   Anemia of chronic disease -slight downtrend since admit, possible degree of iatrogenic component with fq labs  Plan  DM Plan -Lantus  + SSI    Thank you for consulting PCCM. The patient has remained off of precedex infusion and does not have critical care need at this time. Discussed with primary team-- at this time we will sign off. Please re-engage if we can be of further help or if clinical picture changes.   Best practice:  Per primary   Labs   CBC: Recent Labs  Lab 03/21/20 0748 03/21/20 0758 03/22/20 0355 03/23/20 0516  WBC 8.9  --  8.7 6.6  NEUTROABS 6.2  --   --   --   HGB 10.7* 12.6 9.6* 9.1*  HCT 35.0* 37.0 32.0* 30.6*  MCV 79.2*  --  81.0 82.7  PLT 382  --  364 009    Basic Metabolic Panel: Recent Labs  Lab 03/18/20 0500 03/18/20 0500 03/21/20 0748 03/21/20 0758 03/22/20 0355 03/23/20 0516 03/24/20 0156  NA 140   < > 141 142 139 141 142  K 3.9   < >  3.4* 3.4* 3.5 4.1 4.5  CL 100   < > 100 98 99 103 103  CO2 32  --  28  --  28 28 27   GLUCOSE 195*   < > 100* 99 123* 93 148*  BUN 24*   < > 17 17 16  22* 28*  CREATININE 1.32*   < > 0.96 1.00 1.08* 1.48* 1.67*  CALCIUM 8.6*  --  8.4*  --  8.3* 7.9* 8.2*  MG  --   --   --   --   --   --  1.5*   < > = values in this interval not displayed.    GFR: Estimated Creatinine Clearance: 43.1 mL/min (A) (by C-G formula based on SCr of 1.67 mg/dL (H)). Recent Labs  Lab 03/21/20 0748 03/22/20 0355 03/23/20 0516  WBC 8.9 8.7 6.6  LATICACIDVEN 1.4  --   --     Liver Function Tests: Recent Labs  Lab 03/21/20 0748 03/22/20 0355 03/23/20 0516  AST 31 31 23   ALT 22 22 19   ALKPHOS 203* 188* 158*  BILITOT 0.9 0.7 0.6  PROT 7.1 6.9 6.4*  ALBUMIN 2.4* 2.4* 2.2*   No results for input(s): LIPASE, AMYLASE in the last 168 hours. Recent Labs  Lab 03/22/20 1645  AMMONIA 26    ABG    Component Value Date/Time   PHART 7.417 03/22/2020 0600   PCO2ART 50.9 (H) 03/22/2020 0600   PO2ART 32.1 (LL) 03/22/2020 0600   HCO3 32.2 (H) 03/22/2020 0600   TCO2 29 03/21/2020 0758   ACIDBASEDEF 1.7 02/25/2020 0845   O2SAT 49.4 03/22/2020 0600     Coagulation Profile: Recent Labs  Lab 03/21/20 0748  INR 1.4*    Cardiac Enzymes: No results for input(s): CKTOTAL, CKMB, CKMBINDEX, TROPONINI in the last 168 hours.  HbA1C: Hgb A1c MFr Bld  Date/Time Value Ref Range Status  02/23/2020 02:53 PM 15.1 (H) 4.8 - 5.6 % Final    Comment:    (NOTE)         Prediabetes: 5.7 - 6.4         Diabetes: >6.4         Glycemic control for adults with diabetes: <7.0   09/22/2019 05:03 AM 11.7 (H) 4.8 - 5.6 % Final    Comment:    (NOTE) Pre diabetes:          5.7%-6.4% Diabetes:              >6.4% Glycemic control for   <7.0% adults with diabetes     CBG: Recent Labs  Lab 03/23/20 1204 03/23/20 1240 03/23/20 1522 03/23/20 2111 03/24/20 [place] MSN, AGACNP-BC [place] [phone] If no answer, [phone] 03/24/2020, 9:31 AM

## 2020-03-25 DIAGNOSIS — G9341 Metabolic encephalopathy: Secondary | ICD-10-CM

## 2020-03-25 LAB — COMPREHENSIVE METABOLIC PANEL
ALT: 19 U/L (ref 0–44)
AST: 23 U/L (ref 15–41)
Albumin: 2.5 g/dL — ABNORMAL LOW (ref 3.5–5.0)
Alkaline Phosphatase: 226 U/L — ABNORMAL HIGH (ref 38–126)
Anion gap: 10 (ref 5–15)
BUN: 30 mg/dL — ABNORMAL HIGH (ref 6–20)
CO2: 26 mmol/L (ref 22–32)
Calcium: 8.3 mg/dL — ABNORMAL LOW (ref 8.9–10.3)
Chloride: 104 mmol/L (ref 98–111)
Creatinine, Ser: 1.63 mg/dL — ABNORMAL HIGH (ref 0.44–1.00)
GFR, Estimated: 36 mL/min — ABNORMAL LOW (ref >60)
Glucose, Bld: 161 mg/dL — ABNORMAL HIGH (ref 70–99)
Potassium: 4.9 mmol/L (ref 3.5–5.1)
Sodium: 140 mmol/L (ref 135–145)
Total Bilirubin: 0.7 mg/dL (ref 0.3–1.2)
Total Protein: 7.1 g/dL (ref 6.5–8.1)

## 2020-03-25 LAB — CBC WITH DIFFERENTIAL/PLATELET
Abs Immature Granulocytes: 0.02 10*3/uL (ref 0.00–0.07)
Basophils Absolute: 0 10*3/uL (ref 0.0–0.1)
Basophils Relative: 0 %
Eosinophils Absolute: 0.1 10*3/uL (ref 0.0–0.5)
Eosinophils Relative: 2 %
HCT: 31.5 % — ABNORMAL LOW (ref 36.0–46.0)
Hemoglobin: 9.5 g/dL — ABNORMAL LOW (ref 12.0–15.0)
Immature Granulocytes: 0 %
Lymphocytes Relative: 23 %
Lymphs Abs: 1.9 10*3/uL (ref 0.7–4.0)
MCH: 25 pg — ABNORMAL LOW (ref 26.0–34.0)
MCHC: 30.2 g/dL (ref 30.0–36.0)
MCV: 82.9 fL (ref 80.0–100.0)
Monocytes Absolute: 1 10*3/uL (ref 0.1–1.0)
Monocytes Relative: 12 %
Neutro Abs: 5.2 10*3/uL (ref 1.7–7.7)
Neutrophils Relative %: 63 %
Platelets: 386 10*3/uL (ref 150–400)
RBC: 3.8 MIL/uL — ABNORMAL LOW (ref 3.87–5.11)
RDW: 21.1 % — ABNORMAL HIGH (ref 11.5–15.5)
WBC: 8.3 10*3/uL (ref 4.0–10.5)
nRBC: 2.4 % — ABNORMAL HIGH (ref 0.0–0.2)

## 2020-03-25 LAB — GLUCOSE, CAPILLARY
Glucose-Capillary: 212 mg/dL — ABNORMAL HIGH (ref 70–99)
Glucose-Capillary: 126 mg/dL — ABNORMAL HIGH (ref 70–99)
Glucose-Capillary: 121 mg/dL — ABNORMAL HIGH (ref 70–99)
Glucose-Capillary: 97 mg/dL (ref 70–99)
Glucose-Capillary: 118 mg/dL — ABNORMAL HIGH (ref 70–99)

## 2020-03-25 LAB — PHOSPHORUS: Phosphorus: 3.8 mg/dL (ref 2.5–4.6)

## 2020-03-25 LAB — MAGNESIUM: Magnesium: 1.9 mg/dL (ref 1.7–2.4)

## 2020-03-25 MED ORDER — LORAZEPAM 2 MG/ML IJ SOLN
0.5000 mg | Freq: Every evening | INTRAMUSCULAR | Status: DC | PRN
  Administered 2020-03-25 – 2020-03-30 (×4): 0.5 mg via INTRAVENOUS
  Filled 2020-03-25 (×4): qty 1

## 2020-03-25 NOTE — Progress Notes (Signed)
   03/25/20 1313  Assess: MEWS Score  Temp 97.8 F (36.6 C)  BP (!) 140/113  Pulse Rate (!) 103  Resp 20  Level of Consciousness Responds to Voice  SpO2 100 %  Assess: MEWS Score  MEWS Temp 0  MEWS Systolic 0  MEWS Pulse 1  MEWS RR 0  MEWS LOC 1  MEWS Score 2  MEWS Score Color Yellow  Assess: if the MEWS score is Yellow or Red  Were vital signs taken at a resting state? Yes  Focused Assessment Change from prior assessment (see assessment flowsheet)  Early Detection of Sepsis Score *See Row Information* Low  MEWS guidelines implemented *See Row Information* Yes  Treat  MEWS Interventions Escalated (See documentation below)  Take Vital Signs  Increase Vital Sign Frequency  Yellow: Q 2hr X 2 then Q 4hr X 2, if remains yellow, continue Q 4hrs  Escalate  MEWS: Escalate Yellow: discuss with charge nurse/RN and consider discussing with provider and RRT  Notify: Charge Nurse/RN  Name of Charge Nurse/RN Notified T.Nathon Stefanski RN  Date Charge Nurse/RN Notified 03/25/20  Time Charge Nurse/RN Notified 1313

## 2020-03-25 NOTE — Progress Notes (Signed)
PROGRESS NOTE    Morgan Beasley  WVP:[id number] DOB: 12-13-1960 DOA: 03/21/2020 PCP: Morgan Sheer, NP    Brief Narrative:  59 year old female with recent hospitalization for line associated pulmonic valve endocarditis and Serratia bacteremia, multiple territory stroke who was discharged home after complicated hospitalization for agitation and delirium on 11/18 on cefepime through PICC line presented back on 11/21 with severe confusion, hallucinations and restlessness.  She was found to have pleural effusions, pulmonary edema.  Waxing and waning agitation delirium requiring Precedex in ICU. 11/23, Precedex off.  On multiple antipsychotic medications.  Remains agitated intermittently.   Assessment & Plan:   Principal Problem:   Acute metabolic encephalopathy Active Problems:   Cerebral embolism with cerebral infarction   Anasarca  Agitated delirium/multiple medical issues and complicated hospitalizations related to metabolic encephalopathy: Difficult to control symptoms. Off Precedex drip.  Appreciate psychiatry input. All-time fall precautions.  Delirium precautions.  Air cabin crew.  Renew soft restraints as much needed to keep IV lines and tubing safe as well as to keep patient safe. Currently on BuSpar Tegretol Nighttime Zyprexa Nighttime amitriptyline Low-dose Ativan at night.  Antibiotics discontinued. Continue supportive treatment, day and night cycle regulation.   Acute hypoxemic respiratory failure secondary to multifactorial pulmonary edema and pleural effusions, acute CHF on chronic obstructive sleep apnea, underlying history of COPD: Intermittently diuresing.  Continue bronchodilators.  Continue IV Lasix 80 mg daily.  Check output.  On metoprolol succinate. Known ejection fraction of 40%.  History of coronary artery disease: She remains on Plavix, beta-blockers.  Currently no evidence of acute coronary syndrome.  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  No evidence of new stroke.  Remains on Plavix and statin.  Chronic pancreatitis: On Creon.  Electrolyte abnormalities: Replaced and adequate.   Type 2 diabetes: Controlled.  Remains on insulin in the hospital.  On oral hypoglycemics at home.  Blood sugars are fairly controlled today on Lantus and sliding scale.  Acute kidney injury on chronic kidney disease stage IIIa: Renal function fluctuates.  Need close monitoring on dialysis.  Anemia of chronic disease: Baseline hemoglobin about 8-9.  No evidence of bleeding.  Right supraclavicular and mediastinal adenopathy: Will need outpatient follow-up once patient is more stabilized.  Prolonged QTC: QTC is 518.  Replace electrolytes and closely monitor.  We will keep on telemetry unit.  Currently needing to use medications including Zyprexa low-dose at night.  Bowel and bladder functions adequate.  Reportedly stool 2 times yesterday.   DVT prophylaxis: SCDs Start: 03/21/20 1629   Code Status: Full code. Family Communication: daughter Morgan Beasley on the phone 11/24 Disposition Plan: Status is: Inpatient  Remains inpatient appropriate because:Unsafe d/c plan and Inpatient level of care appropriate due to severity of illness   Dispo: The patient is from: Home              Anticipated d/c is to: SNF              Anticipated d/c date is: > 3 days              Patient currently is not medically stable to d/c.  Patient can transfer to telemetry unit from stepdown unit today.   Consultants:   PCCM  ID  Procedures:   None  Antimicrobials:   None   Subjective: Patient seen and examined.  Intermittently agitated.  She was trying to get out  of the bed, thrashing around at night so needed some Ativan and soft restraints.  No other overnight events. On my interview, on persistent questioning she will  open her eyes and say something otherwise not really interactive.  Objective: Vitals:   03/25/20 0500 03/25/20 0800 03/25/20 0815 03/25/20 0910  BP:  (!) 147/87    Pulse:      Resp:  (!) 33  (!) 26  Temp:   98.1 F (36.7 C)   TempSrc:   Axillary   SpO2:  100%  100%  Weight: 97.1 kg     Height:        Intake/Output Summary (Last 24 hours) at 03/25/2020 1104 Last data filed at 03/25/2020 0900 Gross per 24 hour  Intake 240 ml  Output 600 ml  Net -360 ml   Filed Weights   03/23/20 0500 03/24/20 0500 03/25/20 0500  Weight: 98.4 kg 97.1 kg 97.1 kg    Examination:  General exam: Chronically sick looking female, not in any obvious distress but sleepy and minimally interactive.   Easily distractible.  Follows simple commands. Alert on stimulation however not oriented. Anxious and agitated at times. Respiratory system: Clear to auscultation. Respiratory effort normal. Cardiovascular system: S1 & S2 heard, RRR. Gastrointestinal system: Soft and nontender.   Central nervous system: No focal logical deficit. Extremities: Moves all extremities. Skin: Chronic venous stasis changes on both legs.  No open ulcers.    Data Reviewed: I have personally reviewed following labs and imaging studies  CBC: Recent Labs  Lab 03/21/20 0748 03/21/20 0758 03/22/20 0355 03/23/20 0516 03/25/20 0239  WBC 8.9  --  8.7 6.6 8.3  NEUTROABS 6.2  --   --   --  5.2  HGB 10.7* 12.6 9.6* 9.1* 9.5*  HCT 35.0* 37.0 32.0* 30.6* 31.5*  MCV 79.2*  --  81.0 82.7 82.9  PLT 382  --  364 328 867   Basic Metabolic Panel: Recent Labs  Lab 03/21/20 0748 03/21/20 0748 03/21/20 0758 03/22/20 0355 03/23/20 0516 03/24/20 0156 03/25/20 0239  NA 141   < > 142 139 141 142 140  K 3.4*   < > 3.4* 3.5 4.1 4.5 4.9  CL 100   < > 98 99 103 103 104  CO2 28  --   --  28 28 27 26   GLUCOSE 100*   < > 99 123* 93 148* 161*  BUN 17   < > 17 16 22* 28* 30*  CREATININE 0.96   < > 1.00 1.08* 1.48* 1.67* 1.63*   CALCIUM 8.4*  --   --  8.3* 7.9* 8.2* 8.3*  MG  --   --   --   --   --  1.5* 1.9  PHOS  --   --   --   --   --   --  3.8   < > = values in this interval not displayed.   GFR: Estimated Creatinine Clearance: 44.2 mL/min (A) (by C-G formula based on SCr of 1.63 mg/dL (H)). Liver Function Tests: Recent Labs  Lab 03/21/20 0748 03/22/20 0355 03/23/20 0516 03/25/20 0239  AST 31 31 23 23   ALT 22 22 19 19   ALKPHOS 203* 188* 158* 226*  BILITOT 0.9 0.7 0.6 0.7  PROT 7.1 6.9 6.4* 7.1  ALBUMIN 2.4* 2.4* 2.2* 2.5*   No results for input(s): LIPASE, AMYLASE in the last 168 hours. Recent Labs  Lab 03/22/20 1645  AMMONIA 26   Coagulation Profile: Recent Labs  Lab 03/21/20 0748  INR 1.4*   Cardiac Enzymes: No results for input(s): CKTOTAL, CKMB, CKMBINDEX, TROPONINI in the last 168 hours. BNP (last 3 results) Recent Labs    12/19/19 1546 02/09/20 0825  PROBNP 1,148* 1,185*   HbA1C: No results for input(s): HGBA1C in the last 72 hours. CBG: Recent Labs  Lab 03/24/20 0728 03/24/20 1143 03/24/20 1642 03/24/20 2058 03/25/20 0829  GLUCAP 136* 171* 159* 212* 126*   Lipid Profile: No results for input(s): CHOL, HDL, LDLCALC, TRIG, CHOLHDL, LDLDIRECT in the last 72 hours. Thyroid Function Tests: Recent Labs    03/22/20 1645  TSH 3.728   Anemia Panel: Recent Labs    03/22/20 1645  VITAMINB12 916*   Sepsis Labs: Recent Labs  Lab 03/21/20 0748  LATICACIDVEN 1.4    Recent Results (from the past 240 hour(s))  Urine culture     Status: Abnormal   Collection Time: 03/21/20  7:30 AM   Specimen: In/Out Cath Urine  Result Value Ref Range Status   Specimen Description   Final    IN/OUT CATH URINE Performed at [facility], [place] [address] [place], [place] [address]    Special Requests   Final    NONE Performed at [facility], [place] [address] [place], [place] [address]    Culture 1,000 COLONIES/mL ENTEROCOCCUS FAECIUM (A)   Final   Report Status 03/23/2020 FINAL  Final   Organism ID, Bacteria ENTEROCOCCUS FAECIUM (A)  Final      Susceptibility   Enterococcus faecium - MIC*    AMPICILLIN RESISTANT Resistant     NITROFURANTOIN 64 INTERMEDIATE Intermediate     VANCOMYCIN 1 SENSITIVE Sensitive     * 1,000 COLONIES/mL ENTEROCOCCUS FAECIUM  Resp Panel by RT-PCR (Flu A&B, Covid)     Status: None   Collection Time: 03/21/20  9:30 AM   Specimen: Nasopharyngeal(NP) swabs in vial transport medium  Result Value Ref Range Status   SARS Coronavirus 2 by RT PCR NEGATIVE NEGATIVE Final    Comment: (NOTE) SARS-CoV-2 target nucleic acids are NOT DETECTED.  The SARS-CoV-2 RNA is generally detectable in upper respiratory specimens during the acute phase of infection. The lowest concentration of SARS-CoV-2 viral copies this assay can detect is 138 copies/mL. A negative result does not preclude SARS-Cov-2 infection and should not be used as the sole basis for treatment or other patient management decisions. A negative result may occur with  improper specimen collection/handling, submission of specimen other than nasopharyngeal swab, presence of viral mutation(s) within the areas targeted by this assay, and inadequate number of viral copies(<138 copies/mL). A negative result must be combined with clinical observations, patient history, and epidemiological information. The expected result is Negative.  Fact Sheet for Patients:  EntrepreneurPulse.com.au  Fact Sheet for Healthcare Providers:  IncredibleEmployment.be  This test is no t yet approved or cleared by the [place] FDA and  has been authorized for detection and/or diagnosis of SARS-CoV-2 by FDA under an Emergency Use Authorization (EUA). This EUA will remain  in effect (meaning this test can be used) for the duration of the COVID-19 declaration under Section 564(b)(1) of the Act, 21 U.S.C.section 360bbb-3(b)(1), unless the  authorization is terminated  or revoked sooner.       Influenza A by PCR NEGATIVE NEGATIVE Final   Influenza B by PCR NEGATIVE NEGATIVE Final    Comment: (NOTE) The Xpert Xpress SARS-CoV-2/FLU/RSV plus assay is intended as an aid in the diagnosis of influenza from Nasopharyngeal swab  specimens and should not be used as a sole basis for treatment. Nasal washings and aspirates are unacceptable for Xpert Xpress SARS-CoV-2/FLU/RSV testing.  Fact Sheet for Patients: EntrepreneurPulse.com.au  Fact Sheet for Healthcare Providers: IncredibleEmployment.be  This test is not yet approved or cleared by the [place] FDA and has been authorized for detection and/or diagnosis of SARS-CoV-2 by FDA under an Emergency Use Authorization (EUA). This EUA will remain in effect (meaning this test can be used) for the duration of the COVID-19 declaration under Section 564(b)(1) of the Act, 21 U.S.C. section 360bbb-3(b)(1), unless the authorization is terminated or revoked.  Performed at [facility], [place] [address] [place], [place] [address]   Blood culture (routine x 2)     Status: None (Preliminary result)   Collection Time: 03/21/20  9:44 AM   Specimen: BLOOD LEFT HAND  Result Value Ref Range Status   Specimen Description   Final    BLOOD LEFT HAND Performed at [place] [address], [place], [place] [address]    Special Requests   Final    BOTTLES DRAWN AEROBIC AND ANAEROBIC Blood Culture results may not be optimal due to an inadequate volume of blood received in culture bottles Performed at [place] [address], [place], [place] [address]    Culture   Final    NO GROWTH 4 DAYS Performed at [facility], [place] [address] [place], [place] [address]    Report Status PENDING  Incomplete  MRSA PCR Screening     Status: None   Collection Time: 03/22/20  5:26 AM   Specimen:  Nasopharyngeal  Result Value Ref Range Status   MRSA by PCR NEGATIVE NEGATIVE Final    Comment:        The GeneXpert MRSA Assay (FDA approved for NASAL specimens only), is one component of a comprehensive MRSA colonization surveillance program. It is not intended to diagnose MRSA infection nor to guide or monitor treatment for MRSA infections. Performed at [facility], [place] [address] [place], [place] [address]          Radiology Studies: VAS [place] UPPER EXTREMITY VENOUS DUPLEX  Result Date: 03/23/2020 UPPER VENOUS STUDY  Indications: Edema Limitations: Poor ultrasound/tissue interface, bandages and line. Comparison Study: 05/09/3233 - Left cephalic thrombus. Performing Technologist: Oliver Hum RVT  Examination Guidelines: A complete evaluation includes B-mode imaging, spectral Doppler, color Doppler, and power Doppler as needed of all accessible portions of each vessel. Bilateral testing is considered an integral part of a complete examination. Limited examinations for reoccurring indications may be performed as noted.  Right Findings: +----------+------------+---------+-----------+----------+-------+ RIGHT     CompressiblePhasicitySpontaneousPropertiesSummary +----------+------------+---------+-----------+----------+-------+ Subclavian    Full       Yes       Yes                      +----------+------------+---------+-----------+----------+-------+  Left Findings: +----------+------------+---------+-----------+----------+-----------------+ LEFT      CompressiblePhasicitySpontaneousProperties     Summary      +----------+------------+---------+-----------+----------+-----------------+ IJV           Full       Yes       Yes                                +----------+------------+---------+-----------+----------+-----------------+ Subclavian    Full       Yes  Yes                                 +----------+------------+---------+-----------+----------+-----------------+ Axillary      Full       Yes       Yes                                +----------+------------+---------+-----------+----------+-----------------+ Brachial      Full       Yes       Yes                                +----------+------------+---------+-----------+----------+-----------------+ Radial        Full                                                    +----------+------------+---------+-----------+----------+-----------------+ Ulnar         Full                                                    +----------+------------+---------+-----------+----------+-----------------+ Cephalic      None                                  Age Indeterminate +----------+------------+---------+-----------+----------+-----------------+ Basilic       Full                                                    +----------+------------+---------+-----------+----------+-----------------+  Summary:  Right: No evidence of thrombosis in the subclavian.  Left: No evidence of deep vein thrombosis in the upper extremity. Findings consistent with age indeterminate superficial vein thrombosis involving the left cephalic vein. Cephalic thrombus appears unchanged from the previous study performed on 03/12/2020.  *See table(s) above for measurements and observations.  Diagnosing physician: Deitra Mayo MD Electronically signed by Deitra Mayo MD on 03/23/2020 at 3:15:40 PM.    Final         Scheduled Meds: . amitriptyline  100 mg Oral QHS  . atorvastatin  20 mg Oral Daily  . busPIRone  10 mg Oral BID  . carbamazepine  200 mg Oral BID  . chlorhexidine  15 mL Mouth Rinse BID  . Chlorhexidine Gluconate Cloth  6 each Topical Daily  . clopidogrel  75 mg Oral Daily  . collagenase   Topical Daily  . docusate sodium  100 mg Oral BID  . furosemide  80 mg Intravenous Daily  . insulin aspart  0-5 Units  Subcutaneous QHS  . insulin aspart  0-6 Units Subcutaneous TID WC  . insulin glargine  10 Units Subcutaneous QHS  . lipase/protease/amylase  72,000 Units Oral TID WC  . mouth rinse  15 mL Mouth Rinse q12n4p  . metoprolol succinate  50 mg Oral Daily  . mometasone-formoterol  2 puff Inhalation BID  .  OLANZapine  2.5 mg Oral QHS  . potassium chloride  40 mEq Oral BID   Continuous Infusions:    LOS: 4 days    Time spent: 30 minutes    Barb Merino, MD [facility] Pager [phone]

## 2020-03-26 LAB — COMPREHENSIVE METABOLIC PANEL
ALT: 18 U/L (ref 0–44)
AST: 24 U/L (ref 15–41)
Albumin: 2.5 g/dL — ABNORMAL LOW (ref 3.5–5.0)
Alkaline Phosphatase: 230 U/L — ABNORMAL HIGH (ref 38–126)
Anion gap: 10 (ref 5–15)
BUN: 27 mg/dL — ABNORMAL HIGH (ref 6–20)
CO2: 27 mmol/L (ref 22–32)
Calcium: 8.6 mg/dL — ABNORMAL LOW (ref 8.9–10.3)
Chloride: 103 mmol/L (ref 98–111)
Creatinine, Ser: 1.34 mg/dL — ABNORMAL HIGH (ref 0.44–1.00)
GFR, Estimated: 46 mL/min — ABNORMAL LOW (ref >60)
Glucose, Bld: 110 mg/dL — ABNORMAL HIGH (ref 70–99)
Potassium: 4.5 mmol/L (ref 3.5–5.1)
Sodium: 140 mmol/L (ref 135–145)
Total Bilirubin: 0.7 mg/dL (ref 0.3–1.2)
Total Protein: 7.2 g/dL (ref 6.5–8.1)

## 2020-03-26 LAB — GLUCOSE, CAPILLARY
Glucose-Capillary: 96 mg/dL (ref 70–99)
Glucose-Capillary: 90 mg/dL (ref 70–99)
Glucose-Capillary: 228 mg/dL — ABNORMAL HIGH (ref 70–99)
Glucose-Capillary: 137 mg/dL — ABNORMAL HIGH (ref 70–99)

## 2020-03-26 LAB — CULTURE, BLOOD (ROUTINE X 2): Culture: NO GROWTH

## 2020-03-26 MED ORDER — LORAZEPAM 2 MG/ML IJ SOLN
0.5000 mg | Freq: Once | INTRAMUSCULAR | Status: AC
  Administered 2020-03-26: 0.5 mg via INTRAVENOUS
  Filled 2020-03-26: qty 1

## 2020-03-26 MED ORDER — LORAZEPAM 2 MG/ML IJ SOLN
1.0000 mg | Freq: Once | INTRAMUSCULAR | Status: AC
  Administered 2020-03-26: 1 mg via INTRAVENOUS
  Filled 2020-03-26: qty 1

## 2020-03-26 MED ORDER — HALOPERIDOL LACTATE 5 MG/ML IJ SOLN
2.0000 mg | Freq: Four times a day (QID) | INTRAMUSCULAR | Status: DC | PRN
  Administered 2020-03-26 – 2020-03-30 (×5): 2 mg via INTRAVENOUS
  Filled 2020-03-26 (×6): qty 1

## 2020-03-26 NOTE — Progress Notes (Signed)
IVC papers signed 03/21/20(Through 03/28/20) are in the orange folder in the shadow chart.

## 2020-03-26 NOTE — TOC Progression Note (Signed)
Transition of Care [facility]) - Progression Note    Patient Details  Name: Morgan Beasley MRN: [id number] Date of Birth: 10/26/1960  [facility] CM/SW Contact  Ross Ludwig, [place] Phone Number: 03/26/2020, 5:54 PM  Clinical Narrative:     PT and OT has to be ordered, waiting for recommendations and will also need insurance authorization.  Expected Discharge Plan: [place] Barriers to Discharge: No Barriers Identified  Expected Discharge Plan and Services Expected Discharge Plan: [place]   Discharge Planning Services: CM Consult Post Acute Care Choice: [place] Living arrangements for the past 2 months: [place] Expected Discharge Date:  (unknown)                                     Social Determinants of Health (SDOH) Interventions    Readmission Risk Interventions Readmission Risk Prevention Plan 03/18/2020 10/21/2019  Transportation Screening Complete Complete  Medication Review Press photographer) - Complete  PCP or Specialist appointment within 3-5 days of discharge Not Complete Complete  PCP/Specialist Appt Not Complete comments First available appt 12/1 -  [place] or Home Care Consult Complete Complete  SW Recovery Care/Counseling Consult Complete Complete  Palliative Care Screening Not Applicable Not [place] Not Applicable Not Applicable  Some recent data might be hidden

## 2020-03-26 NOTE — Progress Notes (Signed)
   03/25/20 1935  Assess: MEWS Score  Temp 98.7 F (37.1 C)  BP 129/78  Pulse Rate (!) 115  Resp (!) 28  Level of Consciousness Alert  SpO2 95 %  O2 Device Nasal Cannula  O2 Flow Rate (L/min) 2 L/min  Assess: MEWS Score  MEWS Temp 0  MEWS Systolic 0  MEWS Pulse 2  MEWS RR 2  MEWS LOC 0  MEWS Score 4  MEWS Score Color Red  Assess: if the MEWS score is Yellow or Red  Were vital signs taken at a resting state? Yes  Focused Assessment  (RN completed focused assessment)  Early Detection of Sepsis Score *See Row Information* Low  MEWS guidelines implemented *See Row Information* Yes  Treat  MEWS Interventions Escalated (See documentation below)  Breathing 1  Negative Vocalization 0  Facial Expression 0  Body Language 0  Consolability 0  PAINAD Score 1  Neuro symptoms relieved by Rest;Relaxation techniques (Comment);Other (Comment) (limit setting, soothe/reaasured)  Notify: Provider  Provider Wardell Honour, NP  Date Provider Notified 03/25/20  Notification Type Page  Notification Reason Other (Comment) (red mews, increased rr and pulse)  Response No new orders

## 2020-03-26 NOTE — Progress Notes (Signed)
PROGRESS NOTE    Morgan Beasley  XAJ:[id number] DOB: 04/28/1961 DOA: 03/21/2020 PCP: Morgan Sheer, NP    Brief Narrative:  59 year old female with recent hospitalization for line associated pulmonic valve endocarditis and Serratia bacteremia, multiple territory stroke who was discharged home after complicated hospitalization for agitation and delirium on 11/18 on cefepime through PICC line presented back on 11/21 with severe confusion, hallucinations and restlessness.  She was found to have pleural effusions, pulmonary edema.  Waxing and waning agitation delirium requiring Precedex in ICU. 11/23, Precedex off.  On multiple antipsychotic medications.  Remains agitated intermittently.   Assessment & Plan:   Principal Problem:   Acute metabolic encephalopathy Active Problems:   Cerebral embolism with cerebral infarction   Anasarca  Agitated delirium/multiple medical issues and complicated hospitalizations related to metabolic encephalopathy: Difficult to control symptoms. Was on precedex drip but now off.  Appreciate psychiatry input. All-time fall precautions.  Delirium precautions.  Air cabin crew.  Renew soft restraints as much needed to keep IV lines and tubing safe as well as to keep patient safe. Currently on BuSpar Tegretol Nighttime Zyprexa Nighttime amitriptyline Low-dose Ativan at night.  Antibiotics discontinued. Continue supportive treatment, day and night cycle regulation.  Continue safety sitter and soft restraints as needed to protect from fall and pulling on lines.  Acute hypoxemic respiratory failure secondary to multifactorial pulmonary edema and pleural effusions, acute CHF on chronic obstructive sleep apnea, underlying history of COPD: Intermittently diuresing.  Continue bronchodilators.  Continue IV Lasix 80 mg daily.  Check output.  On metoprolol succinate. Known ejection fraction of 40%.  History of coronary artery disease: She remains on Plavix,  beta-blockers.  Currently no evidence of acute coronary syndrome.  Recent evolving right and chronic left occipital infarcts: MRI on 11/23 with remote left thalamic and basal ganglia microhemorrhages and chronic microvascular ischemic changes.  No evidence of new stroke.  Remains on Plavix and statin.  Chronic pancreatitis: On Creon.  Electrolyte abnormalities: Replaced and adequate.   Type 2 diabetes: Controlled.  Remains on insulin in the hospital.  On oral hypoglycemics at home.  Blood sugars are fairly controlled today on Lantus and sliding scale.  Acute kidney injury on chronic kidney disease stage IIIa: Renal function fluctuates.  Need close monitoring on dialysis.  Stabilizing.  Anemia of chronic disease: Baseline hemoglobin about 8-9.  No evidence of bleeding.  Right supraclavicular and mediastinal adenopathy: Will need outpatient follow-up once patient is more stabilized.  Prolonged QTC: QTC is 518.  Replace electrolytes and closely monitor.  We will keep on telemetry unit.  Currently needing to use medications including Zyprexa low-dose at night. Recheck EKG today.  Bowel and bladder functions adequate.    DVT prophylaxis: SCDs Start: 03/21/20 1629   Code Status: Full code. Family Communication: daughter Morgan Beasley on the phone 11/24 Disposition Plan: Status is: Inpatient  Remains inpatient appropriate because:Unsafe d/c plan and Inpatient level of care appropriate due to severity of illness   Dispo: The patient is from: Home              Anticipated d/c is to: SNF              Anticipated d/c date is: > 3 days              Patient currently is not medically stable to d/c.  Consultants:   PCCM  ID  Procedures:   None  Antimicrobials:   None   Subjective: Patient seen and examined.  Overnight  reported restlessness and soft restraints were given.  Today patient was awake, she answered few questions then got distracted.  She will not participate in  interview.  Objective: Vitals:   03/26/20 0912 03/26/20 0930 03/26/20 1017 03/26/20 1137  BP: (!) 163/99  (!) 152/97 (!) 172/99  Pulse: (!) 111  (!) 113 (!) 114  Resp: 15  16   Temp:  97.9 F (36.6 C) 98 F (36.7 C) 98.2 F (36.8 C)  TempSrc:  Oral Oral Oral  SpO2: 99%  100% 100%  Weight:      Height:        Intake/Output Summary (Last 24 hours) at 03/26/2020 1205 Last data filed at 03/26/2020 2637 Gross per 24 hour  Intake 360 ml  Output 1925 ml  Net -1565 ml   Filed Weights   03/24/20 0500 03/25/20 0500 03/26/20 0500  Weight: 97.1 kg 97.1 kg 98.5 kg    Examination:  General exam: Chronically sick looking female, not in any obvious distress. Easily distractible.  Follows simple commands. Alert on interview however not oriented. Anxious and agitated at times. Respiratory system: Clear to auscultation. Respiratory effort normal. Cardiovascular system: S1 & S2 heard, RRR. Gastrointestinal system: Soft and nontender.   Central nervous system: No focal logical deficit. Extremities: Moves all extremities. Skin: Chronic venous stasis changes on both legs.  No open ulcers.    Data Reviewed: I have personally reviewed following labs and imaging studies  CBC: Recent Labs  Lab 03/21/20 0748 03/21/20 0758 03/22/20 0355 03/23/20 0516 03/25/20 0239  WBC 8.9  --  8.7 6.6 8.3  NEUTROABS 6.2  --   --   --  5.2  HGB 10.7* 12.6 9.6* 9.1* 9.5*  HCT 35.0* 37.0 32.0* 30.6* 31.5*  MCV 79.2*  --  81.0 82.7 82.9  PLT 382  --  364 328 858   Basic Metabolic Panel: Recent Labs  Lab 03/22/20 0355 03/23/20 0516 03/24/20 0156 03/25/20 0239 03/26/20 0522  NA 139 141 142 140 140  K 3.5 4.1 4.5 4.9 4.5  CL 99 103 103 104 103  CO2 28 28 27 26 27   GLUCOSE 123* 93 148* 161* 110*  BUN 16 22* 28* 30* 27*  CREATININE 1.08* 1.48* 1.67* 1.63* 1.34*  CALCIUM 8.3* 7.9* 8.2* 8.3* 8.6*  MG  --   --  1.5* 1.9  --   PHOS  --   --   --  3.8  --    GFR: Estimated Creatinine  Clearance: 54.2 mL/min (A) (by C-G formula based on SCr of 1.34 mg/dL (H)). Liver Function Tests: Recent Labs  Lab 03/21/20 0748 03/22/20 0355 03/23/20 0516 03/25/20 0239 03/26/20 0522  AST 31 31 23 23 24   ALT 22 22 19 19 18   ALKPHOS 203* 188* 158* 226* 230*  BILITOT 0.9 0.7 0.6 0.7 0.7  PROT 7.1 6.9 6.4* 7.1 7.2  ALBUMIN 2.4* 2.4* 2.2* 2.5* 2.5*   No results for input(s): LIPASE, AMYLASE in the last 168 hours. Recent Labs  Lab 03/22/20 1645  AMMONIA 26   Coagulation Profile: Recent Labs  Lab 03/21/20 0748  INR 1.4*   Cardiac Enzymes: No results for input(s): CKTOTAL, CKMB, CKMBINDEX, TROPONINI in the last 168 hours. BNP (last 3 results) Recent Labs    12/19/19 1546 02/09/20 0825  PROBNP 1,148* 1,185*   HbA1C: No results for input(s): HGBA1C in the last 72 hours. CBG: Recent Labs  Lab 03/25/20 1158 03/25/20 1646 03/25/20 2051 03/26/20 0729 03/26/20 1155  GLUCAP 121*  97 118* 96 90   Lipid Profile: No results for input(s): CHOL, HDL, LDLCALC, TRIG, CHOLHDL, LDLDIRECT in the last 72 hours. Thyroid Function Tests: No results for input(s): TSH, T4TOTAL, FREET4, T3FREE, THYROIDAB in the last 72 hours. Anemia Panel: No results for input(s): VITAMINB12, FOLATE, FERRITIN, TIBC, IRON, RETICCTPCT in the last 72 hours. Sepsis Labs: Recent Labs  Lab 03/21/20 0748  LATICACIDVEN 1.4    Recent Results (from the past 240 hour(s))  Urine culture     Status: Abnormal   Collection Time: 03/21/20  7:30 AM   Specimen: In/Out Cath Urine  Result Value Ref Range Status   Specimen Description   Final    IN/OUT CATH URINE Performed at [facility], [place] [address] [place], [place] [address]    Special Requests   Final    NONE Performed at [facility], [place] [address], [place], [place] [address]    Culture 1,000 COLONIES/mL ENTEROCOCCUS FAECIUM (A)  Final   Report Status 03/23/2020 FINAL  Final   Organism ID, Bacteria ENTEROCOCCUS  FAECIUM (A)  Final      Susceptibility   Enterococcus faecium - MIC*    AMPICILLIN RESISTANT Resistant     NITROFURANTOIN 64 INTERMEDIATE Intermediate     VANCOMYCIN 1 SENSITIVE Sensitive     * 1,000 COLONIES/mL ENTEROCOCCUS FAECIUM  Resp Panel by RT-PCR (Flu A&B, Covid)     Status: None   Collection Time: 03/21/20  9:30 AM   Specimen: Nasopharyngeal(NP) swabs in vial transport medium  Result Value Ref Range Status   SARS Coronavirus 2 by RT PCR NEGATIVE NEGATIVE Final    Comment: (NOTE) SARS-CoV-2 target nucleic acids are NOT DETECTED.  The SARS-CoV-2 RNA is generally detectable in upper respiratory specimens during the acute phase of infection. The lowest concentration of SARS-CoV-2 viral copies this assay can detect is 138 copies/mL. A negative result does not preclude SARS-Cov-2 infection and should not be used as the sole basis for treatment or other patient management decisions. A negative result may occur with  improper specimen collection/handling, submission of specimen other than nasopharyngeal swab, presence of viral mutation(s) within the areas targeted by this assay, and inadequate number of viral copies(<138 copies/mL). A negative result must be combined with clinical observations, patient history, and epidemiological information. The expected result is Negative.  Fact Sheet for Patients:  EntrepreneurPulse.com.au  Fact Sheet for Healthcare Providers:  IncredibleEmployment.be  This test is no t yet approved or cleared by the [place] FDA and  has been authorized for detection and/or diagnosis of SARS-CoV-2 by FDA under an Emergency Use Authorization (EUA). This EUA will remain  in effect (meaning this test can be used) for the duration of the COVID-19 declaration under Section 564(b)(1) of the Act, 21 U.S.C.section 360bbb-3(b)(1), unless the authorization is terminated  or revoked sooner.       Influenza A by PCR  NEGATIVE NEGATIVE Final   Influenza B by PCR NEGATIVE NEGATIVE Final    Comment: (NOTE) The Xpert Xpress SARS-CoV-2/FLU/RSV plus assay is intended as an aid in the diagnosis of influenza from Nasopharyngeal swab specimens and should not be used as a sole basis for treatment. Nasal washings and aspirates are unacceptable for Xpert Xpress SARS-CoV-2/FLU/RSV testing.  Fact Sheet for Patients: EntrepreneurPulse.com.au  Fact Sheet for Healthcare Providers: IncredibleEmployment.be  This test is not yet approved or cleared by the [place] FDA and has been authorized for detection and/or diagnosis of SARS-CoV-2 by FDA under an Emergency Use  Authorization (EUA). This EUA will remain in effect (meaning this test can be used) for the duration of the COVID-19 declaration under Section 564(b)(1) of the Act, 21 U.S.C. section 360bbb-3(b)(1), unless the authorization is terminated or revoked.  Performed at [facility], [place] [address] [place], [place] [address]   Blood culture (routine x 2)     Status: None (Preliminary result)   Collection Time: 03/21/20  9:44 AM   Specimen: BLOOD LEFT HAND  Result Value Ref Range Status   Specimen Description   Final    BLOOD LEFT HAND Performed at [facility] [address], [place], [place] [address]    Special Requests   Final    BOTTLES DRAWN AEROBIC AND ANAEROBIC Blood Culture results may not be optimal due to an inadequate volume of blood received in culture bottles Performed at [facility] [address] [place], [place] [address]    Culture   Final    NO GROWTH 4 DAYS Performed at [facility], [place] [address] [place], [place] [address]    Report Status PENDING  Incomplete  MRSA PCR Screening     Status: None   Collection Time: 03/22/20  5:26 AM   Specimen: Nasopharyngeal  Result Value Ref Range Status   MRSA by PCR NEGATIVE NEGATIVE  Final    Comment:        The GeneXpert MRSA Assay (FDA approved for NASAL specimens only), is one component of a comprehensive MRSA colonization surveillance program. It is not intended to diagnose MRSA infection nor to guide or monitor treatment for MRSA infections. Performed at [facility], [place] [address], [place], [place] [address]          Radiology Studies: No results found.      Scheduled Meds: . amitriptyline  100 mg Oral QHS  . atorvastatin  20 mg Oral Daily  . busPIRone  10 mg Oral BID  . carbamazepine  200 mg Oral BID  . chlorhexidine  15 mL Mouth Rinse BID  . clopidogrel  75 mg Oral Daily  . collagenase   Topical Daily  . docusate sodium  100 mg Oral BID  . furosemide  80 mg Intravenous Daily  . insulin aspart  0-5 Units Subcutaneous QHS  . insulin aspart  0-6 Units Subcutaneous TID WC  . insulin glargine  10 Units Subcutaneous QHS  . lipase/protease/amylase  72,000 Units Oral TID WC  . mouth rinse  15 mL Mouth Rinse q12n4p  . metoprolol succinate  50 mg Oral Daily  . mometasone-formoterol  2 puff Inhalation BID  . OLANZapine  2.5 mg Oral QHS  . potassium chloride  40 mEq Oral BID   Continuous Infusions:    LOS: 5 days    Time spent: 30 minutes    Barb Merino, MD [facility] Pager [phone]

## 2020-03-26 NOTE — Progress Notes (Signed)
Haldol 2mg  wasted at 1520 due to leaking IV to left foot.

## 2020-03-27 LAB — COMPREHENSIVE METABOLIC PANEL
ALT: 19 U/L (ref 0–44)
AST: 27 U/L (ref 15–41)
Albumin: 2.7 g/dL — ABNORMAL LOW (ref 3.5–5.0)
Alkaline Phosphatase: 246 U/L — ABNORMAL HIGH (ref 38–126)
Anion gap: 12 (ref 5–15)
BUN: 24 mg/dL — ABNORMAL HIGH (ref 6–20)
CO2: 29 mmol/L (ref 22–32)
Calcium: 8.8 mg/dL — ABNORMAL LOW (ref 8.9–10.3)
Chloride: 101 mmol/L (ref 98–111)
Creatinine, Ser: 1.22 mg/dL — ABNORMAL HIGH (ref 0.44–1.00)
GFR, Estimated: 51 mL/min — ABNORMAL LOW (ref >60)
Glucose, Bld: 144 mg/dL — ABNORMAL HIGH (ref 70–99)
Potassium: 4.7 mmol/L (ref 3.5–5.1)
Sodium: 142 mmol/L (ref 135–145)
Total Bilirubin: 0.7 mg/dL (ref 0.3–1.2)
Total Protein: 7.8 g/dL (ref 6.5–8.1)

## 2020-03-27 LAB — METHYLMALONIC ACID, SERUM: Methylmalonic Acid, Quantitative: 271 nmol/L (ref 0–378)

## 2020-03-27 LAB — GLUCOSE, CAPILLARY
Glucose-Capillary: 101 mg/dL — ABNORMAL HIGH (ref 70–99)
Glucose-Capillary: 125 mg/dL — ABNORMAL HIGH (ref 70–99)
Glucose-Capillary: 143 mg/dL — ABNORMAL HIGH (ref 70–99)
Glucose-Capillary: 160 mg/dL — ABNORMAL HIGH (ref 70–99)

## 2020-03-27 MED ORDER — OLANZAPINE 5 MG PO TABS
5.0000 mg | ORAL_TABLET | Freq: Every day | ORAL | Status: DC
  Administered 2020-03-27 – 2020-04-23 (×23): 5 mg via ORAL
  Filled 2020-03-27 (×25): qty 1

## 2020-03-27 NOTE — Progress Notes (Signed)
PT Cancellation Note  Patient Details Name: Leilany Digeronimo MRN: [id number] DOB: [birth year]   Cancelled Treatment:    Reason Eval/Treat Not Completed: Other (comment) (Per RN pt is not able to follow commands and is in restraints 2* agitation this morning. Pt is not presently able to participate in PT. Will follow.)  Philomena Doheny PT 03/27/2020  Acute Rehabilitation Services Pager [phone] Office [phone]

## 2020-03-27 NOTE — Progress Notes (Signed)
PROGRESS NOTE    Morgan Beasley  HQI:[id number] DOB: 1960/12/29 DOA: 03/21/2020 PCP: Riki Sheer, NP    Brief Narrative:  59 year old female with recent hospitalization for line associated pulmonic valve endocarditis and Serratia bacteremia, multiple territory stroke who was discharged home after complicated hospitalization for agitation and delirium on 11/18 on cefepime through PICC line presented back on 11/21 with severe confusion, hallucinations and restlessness.  She was found to have pleural effusions, pulmonary edema.  Waxing and waning agitation delirium requiring Precedex in ICU. 11/23, Precedex off.  On multiple antipsychotic medications.  Remains agitated intermittently.  Patient has history of depression, bipolar disorder, cocaine use and alcoholism.  She was even found with alcohol with her when she was discharged from hospital with complicated hospitalization. Hospital course is complicated with ongoing intermittent agitation and underlying behavioral issues.   Assessment & Plan:   Principal Problem:   Acute metabolic encephalopathy Active Problems:   Cerebral embolism with cerebral infarction   Anasarca  Agitation, delirium and metabolic encephalopathy in a patient with known drug and alcohol use disorder and bipolar disorder.   Difficult to control symptoms. Was on precedex drip but now off.  Appreciate psychiatry input. All-time fall precautions.  Delirium precautions.  Air cabin crew.  Renew soft restraints as much needed to keep IV lines and tubing safe as well as to keep patient safe. Currently on BuSpar Tegretol Nighttime Zyprexa, will increase dose to 5 mg at night.  Repeat QTC was 490. Nighttime amitriptyline Low-dose Ativan at night.  Antibiotics discontinued. Continue supportive treatment, day and night cycle regulation.  Continue safety sitter and soft restraints as needed to protect from fall and pulling on lines.  Acute hypoxemic respiratory  failure secondary to multifactorial pulmonary edema and pleural effusions, acute CHF on chronic obstructive sleep apnea, underlying history of COPD: Intermittently diuresing.  Continue bronchodilators.  Continue IV Lasix 80 mg daily.  On metoprolol succinate. Known ejection fraction of 40%. Negative fluid balance of 3 L.  We will continue diuresis.  History of coronary artery disease: She remains on Plavix, beta-blockers.  Currently no evidence of acute coronary syndrome.  Recent evolving right and chronic left occipital infarcts: MRI on 11/23 with remote left thalamic and basal ganglia microhemorrhages and chronic microvascular ischemic changes.  No evidence of new stroke.  Remains on Plavix and statin.  Chronic pancreatitis: On Creon.  Electrolyte abnormalities: Replaced and adequate.   Type 2 diabetes: Controlled.  Remains on insulin in the hospital.  On oral hypoglycemics at home.  Blood sugars are fairly controlled today on Lantus and sliding scale.  Acute kidney injury on chronic kidney disease stage IIIa: Renal function fluctuates.  Need close monitoring on dialysis.  Stabilizing.  Anemia of chronic disease: Baseline hemoglobin about 8-9.  No evidence of bleeding.  Right supraclavicular and mediastinal adenopathy:  Patient has history of breast cancer treated with mastectomy.   Similar lymphadenopathy present on 10/20 20 and does not look like progressed in last 1 year.   Will need outpatient follow-up once patient is more stabilized.  Bowel and bladder functions adequate.    DVT prophylaxis: SCDs Start: 03/21/20 1629   Code Status: Full code. Family Communication: Patient's daughter [name] at the bedside.  Other daughter on the phone. Disposition Plan: Status is: Inpatient  Remains inpatient appropriate because:Unsafe d/c plan and Inpatient level of care appropriate due to severity of illness   Dispo: The patient is from: Home  Anticipated d/c is to: SNF,  versus long-term care.  She will also benefit with inpatient psych placement, however not able to participate.              Anticipated d/c date is: > 3 days              Patient currently is not medically stable to d/c.  Consultants:   PCCM  ID  Procedures:   None  Antimicrobials:   None   Subjective: Patient seen and examined.  She was very restless yesterday afternoon.  She slept all night and is still very drowsy in the morning.  We could not keep of the conversation.  She will wake up in between but not talk.  Objective: Vitals:   03/27/20 0500 03/27/20 0720 03/27/20 0900 03/27/20 1010  BP:  (!) 157/119  (!) 163/117  Pulse:  (!) 106  (!) 106  Resp:  (!) 30  (!) 26  Temp:    (!) 97 F (36.1 C)  TempSrc:    Axillary  SpO2:   97% 99%  Weight: 97.1 kg     Height:        Intake/Output Summary (Last 24 hours) at 03/27/2020 1243 Last data filed at 03/27/2020 1221 Gross per 24 hour  Intake 240 ml  Output 3150 ml  Net -2910 ml   Filed Weights   03/25/20 0500 03/26/20 0500 03/27/20 0500  Weight: 97.1 kg 98.5 kg 97.1 kg    Examination:  General exam: Chronically sick looking female, not in any obvious distress. Sleepy and lethargic. Respiratory system: Clear to auscultation. Respiratory effort normal. Cardiovascular system: S1 & S2 heard, RRR. Gastrointestinal system: Soft and nontender.   Central nervous system: No focal logical deficit. Extremities: Moves all extremities. Skin: Chronic venous stasis changes on both legs.  No open ulcers.    Data Reviewed: I have personally reviewed following labs and imaging studies  CBC: Recent Labs  Lab 03/21/20 0748 03/21/20 0758 03/22/20 0355 03/23/20 0516 03/25/20 0239  WBC 8.9  --  8.7 6.6 8.3  NEUTROABS 6.2  --   --   --  5.2  HGB 10.7* 12.6 9.6* 9.1* 9.5*  HCT 35.0* 37.0 32.0* 30.6* 31.5*  MCV 79.2*  --  81.0 82.7 82.9  PLT 382  --  364 328 761   Basic Metabolic Panel: Recent Labs  Lab 03/23/20 0516  03/24/20 0156 03/25/20 0239 03/26/20 0522 03/27/20 0605  NA 141 142 140 140 142  K 4.1 4.5 4.9 4.5 4.7  CL 103 103 104 103 101  CO2 28 27 26 27 29   GLUCOSE 93 148* 161* 110* 144*  BUN 22* 28* 30* 27* 24*  CREATININE 1.48* 1.67* 1.63* 1.34* 1.22*  CALCIUM 7.9* 8.2* 8.3* 8.6* 8.8*  MG  --  1.5* 1.9  --   --   PHOS  --   --  3.8  --   --    GFR: Estimated Creatinine Clearance: 59 mL/min (A) (by C-G formula based on SCr of 1.22 mg/dL (H)). Liver Function Tests: Recent Labs  Lab 03/22/20 0355 03/23/20 0516 03/25/20 0239 03/26/20 0522 03/27/20 0605  AST 31 23 23 24 27   ALT 22 19 19 18 19   ALKPHOS 188* 158* 226* 230* 246*  BILITOT 0.7 0.6 0.7 0.7 0.7  PROT 6.9 6.4* 7.1 7.2 7.8  ALBUMIN 2.4* 2.2* 2.5* 2.5* 2.7*   No results for input(s): LIPASE, AMYLASE in the last 168 hours. Recent Labs  Lab 03/22/20 1645  AMMONIA 26   Coagulation Profile: Recent Labs  Lab 03/21/20 0748  INR 1.4*   Cardiac Enzymes: No results for input(s): CKTOTAL, CKMB, CKMBINDEX, TROPONINI in the last 168 hours. BNP (last 3 results) Recent Labs    12/19/19 1546 02/09/20 0825  PROBNP 1,148* 1,185*   HbA1C: No results for input(s): HGBA1C in the last 72 hours. CBG: Recent Labs  Lab 03/26/20 1155 03/26/20 1659 03/26/20 2134 03/27/20 0744 03/27/20 1139  GLUCAP 90 228* 137* 101* 125*   Lipid Profile: No results for input(s): CHOL, HDL, LDLCALC, TRIG, CHOLHDL, LDLDIRECT in the last 72 hours. Thyroid Function Tests: No results for input(s): TSH, T4TOTAL, FREET4, T3FREE, THYROIDAB in the last 72 hours. Anemia Panel: No results for input(s): VITAMINB12, FOLATE, FERRITIN, TIBC, IRON, RETICCTPCT in the last 72 hours. Sepsis Labs: Recent Labs  Lab 03/21/20 0748  LATICACIDVEN 1.4    Recent Results (from the past 240 hour(s))  Urine culture     Status: Abnormal   Collection Time: 03/21/20  7:30 AM   Specimen: In/Out Cath Urine  Result Value Ref Range Status   Specimen Description    Final    IN/OUT CATH URINE Performed at [facility], [place] [address] [place], [place] [address]    Special Requests   Final    NONE Performed at [facility], [place] [address] [place], [place] [address]    Culture 1,000 COLONIES/mL ENTEROCOCCUS FAECIUM (A)  Final   Report Status 03/23/2020 FINAL  Final   Organism ID, Bacteria ENTEROCOCCUS FAECIUM (A)  Final      Susceptibility   Enterococcus faecium - MIC*    AMPICILLIN RESISTANT Resistant     NITROFURANTOIN 64 INTERMEDIATE Intermediate     VANCOMYCIN 1 SENSITIVE Sensitive     * 1,000 COLONIES/mL ENTEROCOCCUS FAECIUM  Resp Panel by RT-PCR (Flu A&B, Covid)     Status: None   Collection Time: 03/21/20  9:30 AM   Specimen: Nasopharyngeal(NP) swabs in vial transport medium  Result Value Ref Range Status   SARS Coronavirus 2 by RT PCR NEGATIVE NEGATIVE Final    Comment: (NOTE) SARS-CoV-2 target nucleic acids are NOT DETECTED.  The SARS-CoV-2 RNA is generally detectable in upper respiratory specimens during the acute phase of infection. The lowest concentration of SARS-CoV-2 viral copies this assay can detect is 138 copies/mL. A negative result does not preclude SARS-Cov-2 infection and should not be used as the sole basis for treatment or other patient management decisions. A negative result may occur with  improper specimen collection/handling, submission of specimen other than nasopharyngeal swab, presence of viral mutation(s) within the areas targeted by this assay, and inadequate number of viral copies(<138 copies/mL). A negative result must be combined with clinical observations, patient history, and epidemiological information. The expected result is Negative.  Fact Sheet for Patients:  EntrepreneurPulse.com.au  Fact Sheet for Healthcare Providers:  IncredibleEmployment.be  This test is no t yet approved or cleared by the [place] FDA and  has  been authorized for detection and/or diagnosis of SARS-CoV-2 by FDA under an Emergency Use Authorization (EUA). This EUA will remain  in effect (meaning this test can be used) for the duration of the COVID-19 declaration under Section 564(b)(1) of the Act, 21 U.S.C.section 360bbb-3(b)(1), unless the authorization is terminated  or revoked sooner.       Influenza A by PCR NEGATIVE NEGATIVE Final   Influenza B by PCR NEGATIVE NEGATIVE Final    Comment: (NOTE) The Xpert Xpress SARS-CoV-2/FLU/RSV plus  assay is intended as an aid in the diagnosis of influenza from Nasopharyngeal swab specimens and should not be used as a sole basis for treatment. Nasal washings and aspirates are unacceptable for Xpert Xpress SARS-CoV-2/FLU/RSV testing.  Fact Sheet for Patients: EntrepreneurPulse.com.au  Fact Sheet for Healthcare Providers: IncredibleEmployment.be  This test is not yet approved or cleared by the [place] FDA and has been authorized for detection and/or diagnosis of SARS-CoV-2 by FDA under an Emergency Use Authorization (EUA). This EUA will remain in effect (meaning this test can be used) for the duration of the COVID-19 declaration under Section 564(b)(1) of the Act, 21 U.S.C. section 360bbb-3(b)(1), unless the authorization is terminated or revoked.  Performed at [facility], [place] [address] [place], [place] [address]   Blood culture (routine x 2)     Status: None   Collection Time: 03/21/20  9:44 AM   Specimen: BLOOD LEFT HAND  Result Value Ref Range Status   Specimen Description   Final    BLOOD LEFT HAND Performed at [place] [address], [place], [place] [address]    Special Requests   Final    BOTTLES DRAWN AEROBIC AND ANAEROBIC Blood Culture results may not be optimal due to an inadequate volume of blood received in culture bottles Performed at [facility]  [address] [place], [place] [address]    Culture   Final    NO GROWTH 5 DAYS Performed at [facility], [place] [address], [place], [place] [address]    Report Status 03/26/2020 FINAL  Final  MRSA PCR Screening     Status: None   Collection Time: 03/22/20  5:26 AM   Specimen: Nasopharyngeal  Result Value Ref Range Status   MRSA by PCR NEGATIVE NEGATIVE Final    Comment:        The GeneXpert MRSA Assay (FDA approved for NASAL specimens only), is one component of a comprehensive MRSA colonization surveillance program. It is not intended to diagnose MRSA infection nor to guide or monitor treatment for MRSA infections. Performed at [facility], [place] [address], [place], [place] [address]          Radiology Studies: No results found.      Scheduled Meds: . amitriptyline  100 mg Oral QHS  . atorvastatin  20 mg Oral Daily  . busPIRone  10 mg Oral BID  . carbamazepine  200 mg Oral BID  . chlorhexidine  15 mL Mouth Rinse BID  . clopidogrel  75 mg Oral Daily  . collagenase   Topical Daily  . docusate sodium  100 mg Oral BID  . furosemide  80 mg Intravenous Daily  . insulin aspart  0-5 Units Subcutaneous QHS  . insulin aspart  0-6 Units Subcutaneous TID WC  . insulin glargine  10 Units Subcutaneous QHS  . lipase/protease/amylase  72,000 Units Oral TID WC  . mouth rinse  15 mL Mouth Rinse q12n4p  . metoprolol succinate  50 mg Oral Daily  . mometasone-formoterol  2 puff Inhalation BID  . OLANZapine  5 mg Oral QHS  . potassium chloride  40 mEq Oral BID   Continuous Infusions:    LOS: 6 days    Time spent: 30 minutes    Barb Merino, MD [facility] Pager [phone]

## 2020-03-27 NOTE — Progress Notes (Signed)
OT Cancellation Note  Patient Details Name: Morgan Beasley MRN: [id number] DOB: 29-Oct-1960   Cancelled Treatment:    Reason Eval/Treat Not Completed: Other (comment) Patient not following 1 step directions with nursing, currently in restraints. Will check back 11/28 as schedule allows.  Delbert Phenix OT OT pager: [place] 03/27/2020, 11:59 AM

## 2020-03-28 LAB — GLUCOSE, CAPILLARY
Glucose-Capillary: 135 mg/dL — ABNORMAL HIGH (ref 70–99)
Glucose-Capillary: 141 mg/dL — ABNORMAL HIGH (ref 70–99)
Glucose-Capillary: 171 mg/dL — ABNORMAL HIGH (ref 70–99)
Glucose-Capillary: 169 mg/dL — ABNORMAL HIGH (ref 70–99)

## 2020-03-28 NOTE — Evaluation (Signed)
Occupational Therapy Evaluation Patient Details Name: Morgan Beasley MRN: [id number] DOB: 08/18/1960 Today's Date: 03/28/2020    History of Present Illness Pt admitted from home 03/21/20  and dx with acute respiratory failure 2* pleural effusions and pulmonary edema as well as acute metabolic encephalopathy and cerebral embolism with cerebral infarction.  Pt with hx of PAD, Lymphadema, DM, diabetic neuropathy, COPD, CKD, polysubstance abuse, bipolar, and fem-pop bypass graft.   Clinical Impression   Morgan Beasley is a 59 year old woman with above pertinent medical history who presents supine in bed in wrist restraints - confused, agitated and restless, and labile. Patient required mod assist to sit at edge of bed, mod x 2 to stand at side of bed to take steps to head of bed. Patient complaining of leg pain with standing. Patient's pain and altered mental status making her too unsafe to attempt to ambulate away from the bed. Currently patient able to feed herself, max assist for grooming (not using wash cloth appropriately) and total assist for all other ADLs due to her confusion, agitation and difficulty following commands. Therapist used multimodal cues and reward system of crackers and soda to get get patient to partially participate. Patient will benefit from skilled OT services while in hospital to improve deficits and learn compensatory strategies as needed in order to improve functional abilities. Patient needs short term rehab at discharge.    Follow Up Recommendations  SNF    Equipment Recommendations   (TBD)    Recommendations for Other Services       Precautions / Restrictions Precautions Precautions: Fall Precaution Comments: L foot wound, restraints Restrictions Weight Bearing Restrictions: No Other Position/Activity Restrictions: monitor top of L foot      Mobility Bed Mobility Overal bed mobility: Needs Assistance Bed Mobility: Supine to Sit;Sit to  Supine Rolling: Mod assist Sidelying to sit: Min guard Supine to sit: Mod assist Sit to supine: Mod assist;HOB elevated   General bed mobility comments: Multimodal cues to gain pt participation and physical assist to bring trunk to upright    Transfers Overall transfer level: Needs assistance Equipment used: 2 person hand held assist Transfers: Sit to/from Stand Sit to Stand: Mod assist;+2 physical assistance;+2 safety/equipment         General transfer comment: Assist to bring wt up and fwd and to balance in initial standing    Balance Overall balance assessment: Needs assistance Sitting-balance support: Feet supported Sitting balance-Leahy Scale: Good Sitting balance - Comments: EOB without support   Standing balance support: Bilateral upper extremity supported Standing balance-Leahy Scale: Poor Standing balance comment: reliant on BUE support during standing                           ADL either performed or assessed with clinical judgement   ADL Overall ADL's : Needs assistance/impaired Eating/Feeding: Set up   Grooming: Set up;Maximal assistance;Wash/dry hands;Sitting Grooming Details (indicate cue type and reason): Provided with wash cloth to wash hands but patient unable to perform - patient opening wash cloth like a package. Patient assisted with wiping hands. Upper Body Bathing: Total assistance;Bed level   Lower Body Bathing: Total assistance;Bed level   Upper Body Dressing : Total assistance;Bed level   Lower Body Dressing: Total assistance;Sit to/from stand Lower Body Dressing Details (indicate cue type and reason): To don socks and mesh underwear. Toilet Transfer: Moderate assistance;+2 for physical assistance;BSC;Stand-pivot   Toileting- Clothing Manipulation and Hygiene: Total assistance;Sit to/from stand  Functional mobility during ADLs: Moderate assistance;+2 for physical assistance;+2 for safety/equipment       Vision   Vision  Assessment?: Vision impaired- to be further tested in functional context Additional Comments: Difficult to assess vision though she did suggest visual deficits pointing to her eyes. When asked if her vision was blurry she nodded yes.     Perception     Praxis      Pertinent Vitals/Pain Pain Assessment: Faces Faces Pain Scale: Hurts little more Pain Location: Feet, LUE Pain Descriptors / Indicators: Grimacing;Guarding Pain Intervention(s): Limited activity within patient's tolerance;Repositioned     Hand Dominance Right   Extremity/Trunk Assessment Upper Extremity Assessment Upper Extremity Assessment: Difficult to assess due to impaired cognition   Lower Extremity Assessment Lower Extremity Assessment: Difficult to assess due to impaired cognition LLE Deficits / Details: Dressings in place L foot       Communication Communication Communication: Expressive difficulties   Cognition Arousal/Alertness: Awake/alert Behavior During Therapy: Restless;Agitated Overall Cognitive Status: No family/caregiver present to determine baseline cognitive functioning Area of Impairment: Orientation;Attention;Following commands;Safety/judgement;Awareness;Problem solving                 Orientation Level: Disoriented to;Time;Situation Current Attention Level: Focused Memory: Decreased recall of precautions;Decreased short-term memory Following Commands: Follows one step commands inconsistently;Follows one step commands with increased time Safety/Judgement: Decreased awareness of safety;Decreased awareness of deficits Awareness: Intellectual Problem Solving: Slow processing;Requires verbal cues;Requires tactile cues General Comments: Labile with episodes of crying. Focused on food   General Comments       Exercises     Shoulder Instructions      Home Living Family/patient expects to be discharged to:: Private residence Living Arrangements: Alone Available Help at Discharge:  Family Type of Home: Apartment Home Access: Stairs to enter Technical brewer of Steps: 1 Entrance Stairs-Rails: Can reach both [place]: One level     Bathroom Shower/Tub: Teacher, early years/pre: Standard Bathroom Accessibility: No   Home Equipment: Environmental consultant - 4 wheels;Bedside commode   Additional Comments: Pt poor historian - info taken from chart review from PT Eval 08/2019      Prior Functioning/Environment Level of Independence: Needs assistance  Gait / Transfers Assistance Needed: uses rollator for mobility  ADL's / Homemaking Assistance Needed: Likely requires assist from daughters for ADLs/IADLs (per note 08/2019, required assist from daughter)   Comments: Pt poor historian - info taken from chart review from PT Eval 08/2019        OT Problem List: Decreased strength;Decreased activity tolerance;Impaired balance (sitting and/or standing);Decreased cognition;Decreased safety awareness;Decreased knowledge of use of DME or AE;Decreased knowledge of precautions;Cardiopulmonary status limiting activity;Obesity;Pain      OT Treatment/Interventions: Self-care/ADL training;Energy conservation;DME and/or AE instruction;Therapeutic exercise;Therapeutic activities;Cognitive remediation/compensation;Patient/family education;Balance training    OT Goals(Current goals can be found in the care plan section) Acute Rehab OT Goals Patient Stated Goal: I want to eat OT Goal Formulation: With patient Time For Goal Achievement: 04/11/20 Potential to Achieve Goals: Fair  OT Frequency: Min 2X/week   Barriers to D/C:            Co-evaluation PT/OT/SLP Co-Evaluation/Treatment: Yes Reason for Co-Treatment: For patient/therapist safety PT goals addressed during session: Mobility/safety with mobility OT goals addressed during session: ADL's and self-care      AM-PAC OT "6 Clicks" Daily Activity     Outcome Measure Help from another person eating meals?: A Little Help  from another person taking care of personal grooming?: A Lot Help from another  person toileting, which includes using toliet, bedpan, or urinal?: Total Help from another person bathing (including washing, rinsing, drying)?: Total Help from another person to put on and taking off regular upper body clothing?: Total Help from another person to put on and taking off regular lower body clothing?: Total 6 Click Score: 9   End of Session Equipment Utilized During Treatment: Gait belt;Rolling walker Nurse Communication: Mobility status  Activity Tolerance: Treatment limited secondary to agitation Patient left: in bed;with call bell/phone within reach;with nursing/sitter in room;Other (comment);with bed alarm set  OT Visit Diagnosis: Other abnormalities of gait and mobility (R26.89);Muscle weakness (generalized) (M62.81);Other symptoms and signs involving cognitive function Pain - Right/Left: Left Pain - part of body: Leg;Ankle and joints of foot                Time: [id number] OT Time Calculation (min): 25 min Charges:  OT General Charges $OT Visit: 1 Visit OT Evaluation $OT Eval Moderate Complexity: 1 Mod  Keneth Borg, OTR/L [place]  Office [phone] Pager: [place] 03/28/2020, 4:27 PM

## 2020-03-28 NOTE — Evaluation (Signed)
Physical Therapy Evaluation Patient Details Name: Morgan Beasley MRN: [id number] DOB: 1960/07/31 Today's Date: 03/28/2020   History of Present Illness  Pt admitted from home 03/21/20  and dx with acute respiratory failure 2* pleural effusions and pulmonary edema as well as acute metabolic encephalopathy and cerebral embolism with cerebral infarction.  Pt with hx of PAD, Lymphadema, DM, diabetic neuropathy, COPD, CKD, polysubstance abuse, bipolar, and fem-pop bypass graft.  Clinical Impression  Pt admitted as above and presenting with functional mobility limitations 2* generalized weakness, balance deficits, L LE/UE pain, but primarily 2* ongoing cognitive deficits.  Pt currently requires significant assist of two for safe performance of basic mobility tasks and would benefit from follow up rehab at SNF level to maximize IND and safety.    Follow Up Recommendations SNF    Equipment Recommendations  None recommended by PT    Recommendations for Other Services       Precautions / Restrictions Precautions Precautions: Fall Precaution Comments: L foot wound Restrictions Weight Bearing Restrictions: No      Mobility  Bed Mobility Overal bed mobility: Needs Assistance Bed Mobility: Supine to Sit;Sit to Supine Rolling: Mod assist   Supine to sit: Mod assist Sit to supine: Mod assist;HOB elevated   General bed mobility comments: Multimodal cues to gain pt participation and physical assist to bring trunk to upright    Transfers Overall transfer level: Needs assistance Equipment used: 2 person hand held assist Transfers: Sit to/from Stand Sit to Stand: Mod assist;+2 physical assistance;+2 safety/equipment         General transfer comment: Assist to bring wt up and fwd and to balance in initial standing  Ambulation/Gait Ambulation/Gait assistance: Min assist;Mod assist;+2 physical assistance Gait Distance (Feet): 3 Feet Assistive device: 2 person hand held assist Gait  Pattern/deviations: Wide base of support;Trunk flexed;Step-to pattern;Decreased step length - right;Decreased step length - left;Shuffle     General Gait Details: Pt side stepped up side of bed only - Pt limited by fatigue and safety concerns  Stairs            Wheelchair Mobility    Modified Rankin (Stroke Patients Only)       Balance Overall balance assessment: Needs assistance Sitting-balance support: Feet supported Sitting balance-Leahy Scale: Good Sitting balance - Comments: EOB without support   Standing balance support: Bilateral upper extremity supported Standing balance-Leahy Scale: Poor Standing balance comment: reliant on BUE support during standing                             Pertinent Vitals/Pain Pain Assessment: Faces Faces Pain Scale: Hurts little more Pain Location: Feet, LUE Pain Descriptors / Indicators: Grimacing;Guarding Pain Intervention(s): Limited activity within patient's tolerance;Monitored during session    Home Living Family/patient expects to be discharged to:: Private residence Living Arrangements: Alone Available Help at Discharge: Family Type of Home: Apartment Home Access: Stairs to enter Entrance Stairs-Rails: Can reach both Entrance Stairs-Number of Steps: 1 Home Layout: One level Home Equipment: Walker - 4 wheels;Bedside commode Additional Comments: Pt poor historian - info taken from chart review from PT Eval 08/2019    Prior Function Level of Independence: Needs assistance   Gait / Transfers Assistance Needed: uses rollator for mobility   ADL's / Homemaking Assistance Needed: Likely requires assist from daughters for ADLs/IADLs (per note 08/2019, required assist from daughter)  Comments: Pt poor historian - info taken from chart review from PT Eval 08/2019  Hand Dominance   Dominant Hand: Right    Extremity/Trunk Assessment   Upper Extremity Assessment Upper Extremity Assessment: Difficult to assess due  to impaired cognition    Lower Extremity Assessment Lower Extremity Assessment: LLE deficits/detail;Difficult to assess due to impaired cognition LLE Deficits / Details: Dressings in place L foot       Communication   Communication: Expressive difficulties  Cognition Arousal/Alertness: Awake/alert Behavior During Therapy: Restless;Agitated Overall Cognitive Status: No family/caregiver present to determine baseline cognitive functioning                                        General Comments      Exercises     Assessment/Plan    PT Assessment Patient needs continued PT services  PT Problem List Decreased strength;Decreased activity tolerance;Decreased balance;Decreased mobility;Decreased coordination;Decreased cognition;Decreased knowledge of use of DME;Obesity;Pain;Decreased safety awareness       PT Treatment Interventions DME instruction;Gait training;Stair training;Functional mobility training;Therapeutic activities;Balance training;Therapeutic exercise;Patient/family education    PT Goals (Current goals can be found in the Care Plan section)  Acute Rehab PT Goals Patient Stated Goal: I want to eat PT Goal Formulation: Patient unable to participate in goal setting Time For Goal Achievement: 04/10/20 Potential to Achieve Goals: Fair    Frequency Min 2X/week   Barriers to discharge Decreased caregiver support Pt does not have 24/7 assist    Co-evaluation PT/OT/SLP Co-Evaluation/Treatment: Yes Reason for Co-Treatment: For patient/therapist safety PT goals addressed during session: Mobility/safety with mobility OT goals addressed during session: ADL's and self-care       AM-PAC PT "6 Clicks" Mobility  Outcome Measure Help needed turning from your back to your side while in a flat bed without using bedrails?: A Lot Help needed moving from lying on your back to sitting on the side of a flat bed without using bedrails?: A Lot Help needed moving to  and from a bed to a chair (including a wheelchair)?: A Lot Help needed standing up from a chair using your arms (e.g., wheelchair or bedside chair)?: A Lot Help needed to walk in hospital room?: A Lot Help needed climbing 3-5 steps with a railing? : Total 6 Click Score: 11    End of Session Equipment Utilized During Treatment: Gait belt Activity Tolerance: Patient limited by fatigue Patient left: in bed;with call bell/phone within reach;with bed alarm set;with nursing/sitter in room Nurse Communication: Mobility status PT Visit Diagnosis: Other abnormalities of gait and mobility (R26.89);Pain;Muscle weakness (generalized) (M62.81);Unsteadiness on feet (R26.81) Pain - Right/Left: Left Pain - part of body: Leg;Ankle and joints of foot;Arm    Time: 1500-1526 PT Time Calculation (min) (ACUTE ONLY): 26 min   Charges:   PT Evaluation $PT Eval Moderate Complexity: [place] Pager [phone] Office [phone]   Morgan Beasley 03/28/2020, 4:08 PM

## 2020-03-28 NOTE — Progress Notes (Signed)
PROGRESS NOTE    Morgan Beasley  OVZ:[id number] DOB: October 02, 1960 DOA: 03/21/2020 PCP: Riki Sheer, NP    Brief Narrative:  59 year old female with recent hospitalization for line associated pulmonic valve endocarditis and Serratia bacteremia, multiple territory stroke who was discharged home after complicated hospitalization for agitation and delirium on 11/18 on cefepime through PICC line presented back on 11/21 with severe confusion, hallucinations and restlessness.  She was found to have pleural effusions, pulmonary edema.  Waxing and waning agitation delirium requiring Precedex in ICU. 11/23, Precedex off.  On multiple antipsychotic medications.  Remains agitated intermittently.  Patient has history of depression, bipolar disorder, cocaine use and alcoholism.  She was even found with alcohol with her when she was discharged from hospital with complicated hospitalization. Hospital course is complicated with ongoing intermittent agitation and underlying behavioral issues.   Assessment & Plan:   Principal Problem:   Acute metabolic encephalopathy Active Problems:   Cerebral embolism with cerebral infarction   Anasarca  Agitation, delirium and metabolic encephalopathy in a patient with known drug and alcohol use disorder and bipolar disorder.   Difficult to control symptoms. Was on precedex drip but now off.  Appreciate psychiatry input. All-time fall precautions.  Delirium precautions.  Air cabin crew.  Renew soft restraints as much needed to keep IV lines and tubing safe as well as to keep patient safe. Currently on BuSpar Tegretol Nighttime Zyprexa, increase dose to 5 mg at night.  Repeat QTC was 490. Nighttime amitriptyline Low-dose Ativan at night.  Antibiotics discontinued. Continue supportive treatment, day and night cycle regulation.  Continue safety sitter and soft restraints as needed to protect from fall and pulling on lines.  Acute hypoxemic respiratory  failure secondary to multifactorial pulmonary edema and pleural effusions, acute CHF on chronic obstructive sleep apnea, underlying history of COPD: Intermittently diuresing.  Continue bronchodilators.  Continue IV Lasix 80 mg daily.  On metoprolol succinate. Known ejection fraction of 40%. Negative fluid balance of 4L.  We will continue diuresis.  History of coronary artery disease: She remains on Plavix, beta-blockers.  Currently no evidence of acute coronary syndrome.  Recent evolving right and chronic left occipital infarcts: MRI on 11/23 with remote left thalamic and basal ganglia microhemorrhages and chronic microvascular ischemic changes.  No evidence of new stroke.  Remains on Plavix and statin.  Chronic pancreatitis: On Creon.  Electrolyte abnormalities: Replaced and adequate.   Type 2 diabetes: Controlled.  Remains on insulin in the hospital.  On oral hypoglycemics at home.  Blood sugars are fairly controlled today on Lantus and sliding scale.  Acute kidney injury on chronic kidney disease stage IIIa: Renal function fluctuates.  Need close monitoring on dialysis.  Stabilizing.  Anemia of chronic disease: Baseline hemoglobin about 8-9.  No evidence of bleeding.  Right supraclavicular and mediastinal adenopathy:  Patient has history of breast cancer treated with mastectomy.   Similar lymphadenopathy present on 10/20 20 and does not look like progressed in last 1 year.   Will need outpatient follow-up once patient is more stabilized.  Bowel and bladder functions adequate.    DVT prophylaxis: SCDs Start: 03/21/20 1629   Code Status: Full code. Family Communication: Patient's daughter [name] at the bedside.  Disposition Plan: Status is: Inpatient  Remains inpatient appropriate because:Unsafe d/c plan and Inpatient level of care appropriate due to severity of illness   Dispo: The patient is from: Home              Anticipated d/c is to:  SNF, versus long-term care.  She will  also benefit with inpatient psych placement, however not able to participate.              Anticipated d/c date is: > 3 days              Patient currently is not medically stable to d/c.  Consultants:   PCCM  ID  Procedures:   None  Antimicrobials:   None   Subjective: Patient seen and examined.  Patient's daughter arrived at the same time.  Overnight restless however patient stayed in the bed. Today she was alert, she tried to answer some questions, however not comprehending.  Objective: Vitals:   03/27/20 2056 03/28/20 0500 03/28/20 0800 03/28/20 1343  BP: (!) 143/107 (!) 140/100  (!) 147/97  Pulse: 100 100  100  Resp: (!) 23 20  18   Temp: 98.5 F (36.9 C) 98.1 F (36.7 C)    TempSrc: Oral     SpO2:   94% 90%  Weight:  93.4 kg    Height:        Intake/Output Summary (Last 24 hours) at 03/28/2020 1400 Last data filed at 03/28/2020 1326 Gross per 24 hour  Intake 60 ml  Output 701 ml  Net -641 ml   Filed Weights   03/26/20 0500 03/27/20 0500 03/28/20 0500  Weight: 98.5 kg 97.1 kg 93.4 kg    Examination:  General exam: Chronically sick looking female, not in any obvious distress. Restless, tremulous and anxious. Respiratory system: Clear to auscultation. Respiratory effort normal. Cardiovascular system: S1 & S2 heard, RRR. Gastrointestinal system: Soft and nontender.   Central nervous system: No focal logical deficit. Extremities: Moves all extremities. Skin: Chronic venous stasis changes on both legs.  No open ulcers.    Data Reviewed: I have personally reviewed following labs and imaging studies  CBC: Recent Labs  Lab 03/22/20 0355 03/23/20 0516 03/25/20 0239  WBC 8.7 6.6 8.3  NEUTROABS  --   --  5.2  HGB 9.6* 9.1* 9.5*  HCT 32.0* 30.6* 31.5*  MCV 81.0 82.7 82.9  PLT 364 328 361   Basic Metabolic Panel: Recent Labs  Lab 03/23/20 0516 03/24/20 0156 03/25/20 0239 03/26/20 0522 03/27/20 0605  NA 141 142 140 140 142  K 4.1 4.5 4.9  4.5 4.7  CL 103 103 104 103 101  CO2 28 27 26 27 29   GLUCOSE 93 148* 161* 110* 144*  BUN 22* 28* 30* 27* 24*  CREATININE 1.48* 1.67* 1.63* 1.34* 1.22*  CALCIUM 7.9* 8.2* 8.3* 8.6* 8.8*  MG  --  1.5* 1.9  --   --   PHOS  --   --  3.8  --   --    GFR: Estimated Creatinine Clearance: 57.8 mL/min (A) (by C-G formula based on SCr of 1.22 mg/dL (H)). Liver Function Tests: Recent Labs  Lab 03/22/20 0355 03/23/20 0516 03/25/20 0239 03/26/20 0522 03/27/20 0605  AST 31 23 23 24 27   ALT 22 19 19 18 19   ALKPHOS 188* 158* 226* 230* 246*  BILITOT 0.7 0.6 0.7 0.7 0.7  PROT 6.9 6.4* 7.1 7.2 7.8  ALBUMIN 2.4* 2.2* 2.5* 2.5* 2.7*   No results for input(s): LIPASE, AMYLASE in the last 168 hours. Recent Labs  Lab 03/22/20 1645  AMMONIA 26   Coagulation Profile: No results for input(s): INR, PROTIME in the last 168 hours. Cardiac Enzymes: No results for input(s): CKTOTAL, CKMB, CKMBINDEX, TROPONINI in the last 168 hours. BNP (last  3 results) Recent Labs    12/19/19 1546 02/09/20 0825  PROBNP 1,148* 1,185*   HbA1C: No results for input(s): HGBA1C in the last 72 hours. CBG: Recent Labs  Lab 03/27/20 1139 03/27/20 1814 03/27/20 2052 03/28/20 0736 03/28/20 1146  GLUCAP 125* 143* 160* 135* 141*   Lipid Profile: No results for input(s): CHOL, HDL, LDLCALC, TRIG, CHOLHDL, LDLDIRECT in the last 72 hours. Thyroid Function Tests: No results for input(s): TSH, T4TOTAL, FREET4, T3FREE, THYROIDAB in the last 72 hours. Anemia Panel: No results for input(s): VITAMINB12, FOLATE, FERRITIN, TIBC, IRON, RETICCTPCT in the last 72 hours. Sepsis Labs: No results for input(s): PROCALCITON, LATICACIDVEN in the last 168 hours.  Recent Results (from the past 240 hour(s))  Urine culture     Status: Abnormal   Collection Time: 03/21/20  7:30 AM   Specimen: In/Out Cath Urine  Result Value Ref Range Status   Specimen Description   Final    IN/OUT CATH URINE Performed at [facility], [place] [address] [place], [place] [address]    Special Requests   Final    NONE Performed at [facility], [place] [address], [place], [place] [address]    Culture 1,000 COLONIES/mL ENTEROCOCCUS FAECIUM (A)  Final   Report Status 03/23/2020 FINAL  Final   Organism ID, Bacteria ENTEROCOCCUS FAECIUM (A)  Final      Susceptibility   Enterococcus faecium - MIC*    AMPICILLIN RESISTANT Resistant     NITROFURANTOIN 64 INTERMEDIATE Intermediate     VANCOMYCIN 1 SENSITIVE Sensitive     * 1,000 COLONIES/mL ENTEROCOCCUS FAECIUM  Resp Panel by RT-PCR (Flu A&B, Covid)     Status: None   Collection Time: 03/21/20  9:30 AM   Specimen: Nasopharyngeal(NP) swabs in vial transport medium  Result Value Ref Range Status   SARS Coronavirus 2 by RT PCR NEGATIVE NEGATIVE Final    Comment: (NOTE) SARS-CoV-2 target nucleic acids are NOT DETECTED.  The SARS-CoV-2 RNA is generally detectable in upper respiratory specimens during the acute phase of infection. The lowest concentration of SARS-CoV-2 viral copies this assay can detect is 138 copies/mL. A negative result does not preclude SARS-Cov-2 infection and should not be used as the sole basis for treatment or other patient management decisions. A negative result Morgan occur with  improper specimen collection/handling, submission of specimen other than nasopharyngeal swab, presence of viral mutation(s) within the areas targeted by this assay, and inadequate number of viral copies(<138 copies/mL). A negative result must be combined with clinical observations, patient history, and epidemiological information. The expected result is Negative.  Fact Sheet for Patients:  EntrepreneurPulse.com.au  Fact Sheet for Healthcare Providers:  IncredibleEmployment.be  This test is no t yet approved or cleared by the [place] FDA and  has been authorized for detection and/or diagnosis of SARS-CoV-2  by FDA under an Emergency Use Authorization (EUA). This EUA will remain  in effect (meaning this test can be used) for the duration of the COVID-19 declaration under Section 564(b)(1) of the Act, 21 U.S.C.section 360bbb-3(b)(1), unless the authorization is terminated  or revoked sooner.       Influenza A by PCR NEGATIVE NEGATIVE Final   Influenza B by PCR NEGATIVE NEGATIVE Final    Comment: (NOTE) The Xpert Xpress SARS-CoV-2/FLU/RSV plus assay is intended as an aid in the diagnosis of influenza from Nasopharyngeal swab specimens and should not be used as a sole basis for treatment. Nasal washings and aspirates are unacceptable for Xpert  Xpress SARS-CoV-2/FLU/RSV testing.  Fact Sheet for Patients: EntrepreneurPulse.com.au  Fact Sheet for Healthcare Providers: IncredibleEmployment.be  This test is not yet approved or cleared by the [place] FDA and has been authorized for detection and/or diagnosis of SARS-CoV-2 by FDA under an Emergency Use Authorization (EUA). This EUA will remain in effect (meaning this test can be used) for the duration of the COVID-19 declaration under Section 564(b)(1) of the Act, 21 U.S.C. section 360bbb-3(b)(1), unless the authorization is terminated or revoked.  Performed at [facility], [place] [address] [place], [place] [address]   Blood culture (routine x 2)     Status: None   Collection Time: 03/21/20  9:44 AM   Specimen: BLOOD LEFT HAND  Result Value Ref Range Status   Specimen Description   Final    BLOOD LEFT HAND Performed at [place] [address] [place], [place] [address]    Special Requests   Final    BOTTLES DRAWN AEROBIC AND ANAEROBIC Blood Culture results Morgan not be optimal due to an inadequate volume of blood received in culture bottles Performed at [place] [address], [place], [place] [address]    Culture   Final     NO GROWTH 5 DAYS Performed at [facility], [place] [address], [place], [place] [address]    Report Status 03/26/2020 FINAL  Final  MRSA PCR Screening     Status: None   Collection Time: 03/22/20  5:26 AM   Specimen: Nasopharyngeal  Result Value Ref Range Status   MRSA by PCR NEGATIVE NEGATIVE Final    Comment:        The GeneXpert MRSA Assay (FDA approved for NASAL specimens only), is one component of a comprehensive MRSA colonization surveillance program. It is not intended to diagnose MRSA infection nor to guide or monitor treatment for MRSA infections. Performed at [facility], [place] [address], [place], [place] [address]          Radiology Studies: No results found.      Scheduled Meds: . amitriptyline  100 mg Oral QHS  . atorvastatin  20 mg Oral Daily  . busPIRone  10 mg Oral BID  . carbamazepine  200 mg Oral BID  . chlorhexidine  15 mL Mouth Rinse BID  . clopidogrel  75 mg Oral Daily  . collagenase   Topical Daily  . docusate sodium  100 mg Oral BID  . furosemide  80 mg Intravenous Daily  . insulin aspart  0-5 Units Subcutaneous QHS  . insulin aspart  0-6 Units Subcutaneous TID WC  . insulin glargine  10 Units Subcutaneous QHS  . lipase/protease/amylase  72,000 Units Oral TID WC  . mouth rinse  15 mL Mouth Rinse q12n4p  . metoprolol succinate  50 mg Oral Daily  . mometasone-formoterol  2 puff Inhalation BID  . OLANZapine  5 mg Oral QHS  . potassium chloride  40 mEq Oral BID   Continuous Infusions:    LOS: 7 days    Time spent: 30 minutes    Barb Merino, MD [facility] Pager [phone]

## 2020-03-28 NOTE — Progress Notes (Signed)
Restraints removed from patient per Dr. [name] request. He did discuss with patients daughter who is visiting her mother. Patient is restless at this time but non violent and can be distracted, sitter remains in room 1:1.

## 2020-03-29 LAB — GLUCOSE, CAPILLARY
Glucose-Capillary: 170 mg/dL — ABNORMAL HIGH (ref 70–99)
Glucose-Capillary: 134 mg/dL — ABNORMAL HIGH (ref 70–99)
Glucose-Capillary: 141 mg/dL — ABNORMAL HIGH (ref 70–99)
Glucose-Capillary: 179 mg/dL — ABNORMAL HIGH (ref 70–99)

## 2020-03-29 MED ORDER — ACETAMINOPHEN 325 MG PO TABS
650.0000 mg | ORAL_TABLET | Freq: Four times a day (QID) | ORAL | Status: DC
  Administered 2020-03-29 – 2020-04-28 (×50): 650 mg via ORAL
  Filled 2020-03-29 (×71): qty 2

## 2020-03-29 MED ORDER — BUSPIRONE HCL 5 MG PO TABS
10.0000 mg | ORAL_TABLET | Freq: Three times a day (TID) | ORAL | Status: DC
  Administered 2020-03-29 – 2020-04-28 (×68): 10 mg via ORAL
  Filled 2020-03-29 (×77): qty 2

## 2020-03-29 MED ORDER — FUROSEMIDE 40 MG PO TABS
40.0000 mg | ORAL_TABLET | Freq: Two times a day (BID) | ORAL | Status: DC
  Administered 2020-03-29 – 2020-03-30 (×3): 40 mg via ORAL
  Filled 2020-03-29 (×4): qty 1

## 2020-03-29 MED ORDER — NICOTINE 14 MG/24HR TD PT24
14.0000 mg | MEDICATED_PATCH | Freq: Every day | TRANSDERMAL | Status: DC
  Administered 2020-03-29 – 2020-04-28 (×26): 14 mg via TRANSDERMAL
  Filled 2020-03-29 (×29): qty 1

## 2020-03-29 NOTE — TOC Progression Note (Signed)
Transition of Care [facility]) - Progression Note    Patient Details  Name: Morgan Beasley MRN: [id number] Date of Birth: November 12, 1960  [facility] CM/SW Contact  Joaquin Courts, RN Phone Number: 03/29/2020, 2:25 PM  Clinical Narrative:   CM notes recommendation for geriatric psychiatric hospital placement.  Patient faxed out to [employer] facilities ( [place], [name], [place], and [place]).  CM spoke with patient's daughter and updated her on the change in recommendation from SNF to [facility] psych.  Patient's daughter is aware and in agreement, but does share that should SNF be recommended again at any point, she would like for her mother to go to [facility].    At this time CM has initiated search for geri psych placement.     Expected Discharge Plan: Psychiatric Hospital Barriers to Discharge: No Barriers Identified  Expected Discharge Plan and Services Expected Discharge Plan: [facility]   Discharge Planning Services: CM Consult Post Acute Care Choice: [place] Living arrangements for the past 2 months: [place] Expected Discharge Date:  (unknown)                                     Social Determinants of Health (SDOH) Interventions    Readmission Risk Interventions Readmission Risk Prevention Plan 03/18/2020 10/21/2019  Transportation Screening Complete Complete  Medication Review (RN Care Manager) - Complete  PCP or Specialist appointment within 3-5 days of discharge Not Complete Complete  PCP/Specialist Appt Not Complete comments First available appt 12/1 -  [place] or Home Care Consult Complete Complete  SW Recovery Care/Counseling Consult Complete Complete  Palliative Care Screening Not Applicable Not [place] Not Applicable Not Applicable  Some recent data might be hidden

## 2020-03-29 NOTE — Consult Note (Signed)
[place] [facility] Consult   Reason for Consult: Ongoing psychiatric medication management, agitation, apathy Referring Physician: Dr. Sloan Leiter Patient Identification: Morgan Beasley MRN:  [id number] Principal Diagnosis: Acute metabolic encephalopathy Diagnosis:  Principal Problem:   Acute metabolic encephalopathy Active Problems:   Cerebral embolism with cerebral infarction   Anasarca   Total Time spent with patient: 30 minutes  Subjective:   Morgan Beasley is a 59 y.o. female patient admitted with acute metabolic encephalopathy.  On evaluation patient is alert and oriented to self only, calm yet somnolent.  Patient was unable to tell him me where she was located, or the month, however she did state that Thanksgiving just passed.  Initially patient was resistant to participate in evaluation, and remained guarded throughout.  She she continued to ruminate throughout about wanting to go home, and was unable to remain awake. ",  Home open his door and let me out.  I need to go home.  I stayed out late last night. "  During the evaluation patient did ask about water, and this nurse practitioner assisted her with drinking water from her cup at the bedside table.  However she became angered and incoherent, and attempted to pull up on the bedside rails.  She remains delusional in her thinking, noting that she has to go home and take care of her younger children.  Patient denies suicidal thoughts, homicidal thoughts, and or auditory visual hallucinations.  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  She was found to have pleural effusions, pulmonary edema.  Waxing and waning  agitation delirium requiring Precedex in ICU. 11/23, Precedex off.  On multiple antipsychotic medications.  Remains agitated intermittently.  Patient has history of depression, bipolar disorder, cocaine use and alcoholism.  She was even found with alcohol with her when she was discharged from hospital with complicated hospitalization. Hospital course is complicated with ongoing intermittent agitation and underlying behavioral issues.  Past Psychiatric History: Anxiety and depression.  Currently taking amitriptyline being prescribed by her primary care provider.  She denies previous psychiatric diagnosis.  She denies previous inpatient admission.  She does not appear to be open to psychiatric services at this time.   Risk to Self:   Risk to Others:   Prior Inpatient Therapy:   Prior Outpatient Therapy:    Past Medical History:  Past Medical History:  Diagnosis Date  . Alcohol dependence ([place])   . Anemia   . Anxiety   . Breast cancer ([place])   . Chronic combined systolic and diastolic CHF (congestive heart failure) ([place])   . Cigarette nicotine dependence   . CKD (chronic kidney disease), stage III ([place])   . Colon polyps   . COPD (chronic obstructive pulmonary disease) ([place])   . Diabetes mellitus without complication ([place])   . Diabetic neuropathy ([place])   . Gout   . Hyperlipemia   . Hypertension   . Insomnia   . Lymphedema   . Marijuana use   . Mild CAD 2016   a. NSTEMI 2016 in context of cocaine abuse, 50% RCA% at that time.  . OSA treated with BiPAP   . PAD (peripheral artery disease) ([place])    a. s/p L SFA stenting 08/2019. b. left fem-to-below-knee-popliteal bypass 09/2019.  Marland Kitchen Pancreatitis  acute on chronic due to ETOH initially.    . Sleep apnea    wears BIPAP  . Ulcer of foot ([place])    right    Past Surgical History:  Procedure Laterality Date  . ABDOMINAL AORTOGRAM W/LOWER EXTREMITY N/A 09/26/2019   Procedure: ABDOMINAL AORTOGRAM W/LOWER EXTREMITY;  Surgeon: Angelia Mould, MD;  Location: [facility];  Service: Cardiovascular;  Laterality: N/A;  . ABDOMINAL AORTOGRAM W/LOWER EXTREMITY Bilateral 12/08/2019   Procedure: ABDOMINAL AORTOGRAM W/LOWER EXTREMITY;  Surgeon: Waynetta Sandy, MD;  Location: [facility];  Service: Cardiovascular;  Laterality: Bilateral;  . ABDOMINAL AORTOGRAM W/LOWER EXTREMITY Left 01/26/2020   Procedure: ABDOMINAL AORTOGRAM W/LOWER EXTREMITY;  Surgeon: Waynetta Sandy, MD;  Location: [facility];  Service: Cardiovascular;  Laterality: Left;  . ABDOMINAL HYSTERECTOMY    . BUBBLE STUDY  03/04/2020   Procedure: BUBBLE STUDY;  Surgeon: Elouise Munroe, MD;  Location: [facility];  Service: Cardiology;;  . [facility]  . FEMORAL-POPLITEAL BYPASS GRAFT Left 10/14/2019   Procedure: BYPASS GRAFT LEFT FEMORAL-POPLITEAL ARTERY USING NONREVERSED SAPHENOUS VEIN;  Surgeon: Waynetta Sandy, MD;  Location: [facility];  Service: Vascular;  Laterality: Left;  Marland Kitchen MASTECTOMY Right April 2016  . PERIPHERAL VASCULAR ATHERECTOMY Left 01/26/2020   Procedure: PERIPHERAL VASCULAR ATHERECTOMY;  Surgeon: Waynetta Sandy, MD;  Location: [facility];  Service: Cardiovascular;  Laterality: Left;  PT and AT - [facility]  . PERIPHERAL VASCULAR BALLOON ANGIOPLASTY Left 01/26/2020   Procedure: PERIPHERAL VASCULAR BALLOON ANGIOPLASTY;  Surgeon: Waynetta Sandy, MD;  Location: [place] [facility];  Service: Cardiovascular;  Laterality: Left;  [place] Trunk   . PERIPHERAL VASCULAR INTERVENTION Left 09/26/2019   Procedure: PERIPHERAL VASCULAR INTERVENTION;  Surgeon: Angelia Mould, MD;  Location: [facility];  Service: Cardiovascular;  Laterality: Left;  superficial femoral  . TEE WITHOUT CARDIOVERSION N/A 03/04/2020   Procedure: TRANSESOPHAGEAL ECHOCARDIOGRAM (TEE);  Surgeon: Elouise Munroe, MD;  Location: [facility] [place] [facility];  Service: Cardiology;  Laterality: N/A;   Family  History:  Family History  Problem Relation Age of Onset  . Diabetes Other   . Heart disease Other   . Breast cancer Maternal Grandmother   . Breast cancer Paternal Grandmother   . Stroke Son   . Colon cancer Neg Hx   . Esophageal cancer Neg Hx   . Rectal cancer Neg Hx   . Stomach cancer Neg Hx    Family Psychiatric  History:  Social History:  Social History   Substance and Sexual Activity  Alcohol Use Not Currently   Comment: Beer - "on the weekends hanging out, maybe 2 or 3"      Social History   Substance and Sexual Activity  Drug Use Not Currently  . Types: Marijuana   Comment: last used 8 2020    Social History   Socioeconomic History  . Marital status: Single    Spouse name: Not on file  . Number of children: Not on file  . Years of education: Not on file  . Highest education level: Not on file  Occupational History  . Not on file  Tobacco Use  . Smoking status: Light Tobacco Smoker    Types: Cigarettes  . Smokeless tobacco: Never Used  . Tobacco comment: 3 cigarettes a day  Vaping Use  . Vaping Use: Never used  Substance and Sexual Activity  . Alcohol use: Not Currently    Comment:  Beer - "on the weekends hanging out, maybe 2 or 3"   . Drug use: Not Currently    Types: Marijuana    Comment: last used 8 2020  . Sexual activity: Not on file  Other Topics Concern  . Not on file  Social History Narrative  . Not on file   Social Determinants of Health   Financial Resource Strain:   . Difficulty of Paying Living Expenses: Not on file  Food Insecurity:   . Worried About Charity fundraiser in the Last Year: Not on file  . Ran Out of Food in the Last Year: Not on file  Transportation Needs:   . Lack of Transportation (Medical): Not on file  . Lack of Transportation (Non-Medical): Not on file  Physical Activity:   . Days of Exercise per Week: Not on file  . Minutes of Exercise per Session: Not on file  Stress:   . Feeling of Stress : Not on file   Social Connections:   . Frequency of Communication with Friends and Family: Not on file  . Frequency of Social Gatherings with Friends and Family: Not on file  . Attends Religious Services: Not on file  . Active Member of Clubs or Organizations: Not on file  . Attends Archivist Meetings: Not on file  . Marital Status: Not on file   Additional Social History:    Allergies:   Allergies  Allergen Reactions  . Tramadol Swelling  . Nsaids Other (See Comments)    Pancreatitis  . Tolmetin Other (See Comments)    Pancreatitis  . Tylenol [Acetaminophen] Other (See Comments)    unknown  . Aspirin Other (See Comments)    "Makes my pancreas act up"     Labs:  Results for orders placed or performed during the hospital encounter of 03/21/20 (from the past 48 hour(s))  Glucose, capillary     Status: Abnormal   Collection Time: 03/27/20 11:39 AM  Result Value Ref Range   Glucose-Capillary 125 (H) 70 - 99 mg/dL    Comment: Glucose reference range applies only to samples taken after fasting for at least 8 hours.   Comment 1 Notify RN   Glucose, capillary     Status: Abnormal   Collection Time: 03/27/20  6:14 PM  Result Value Ref Range   Glucose-Capillary 143 (H) 70 - 99 mg/dL    Comment: Glucose reference range applies only to samples taken after fasting for at least 8 hours.   Comment 1 Notify RN   Glucose, capillary     Status: Abnormal   Collection Time: 03/27/20  8:52 PM  Result Value Ref Range   Glucose-Capillary 160 (H) 70 - 99 mg/dL    Comment: Glucose reference range applies only to samples taken after fasting for at least 8 hours.  Glucose, capillary     Status: Abnormal   Collection Time: 03/28/20  7:36 AM  Result Value Ref Range   Glucose-Capillary 135 (H) 70 - 99 mg/dL    Comment: Glucose reference range applies only to samples taken after fasting for at least 8 hours.  Glucose, capillary     Status: Abnormal   Collection Time: 03/28/20 11:46 AM  Result Value  Ref Range   Glucose-Capillary 141 (H) 70 - 99 mg/dL    Comment: Glucose reference range applies only to samples taken after fasting for at least 8 hours.  Glucose, capillary     Status: Abnormal   Collection Time: 03/28/20  4:14  PM  Result Value Ref Range   Glucose-Capillary 171 (H) 70 - 99 mg/dL    Comment: Glucose reference range applies only to samples taken after fasting for at least 8 hours.  Glucose, capillary     Status: Abnormal   Collection Time: 03/28/20  9:49 PM  Result Value Ref Range   Glucose-Capillary 169 (H) 70 - 99 mg/dL    Comment: Glucose reference range applies only to samples taken after fasting for at least 8 hours.   Comment 1 Notify RN    Comment 2 Document in Chart   Glucose, capillary     Status: Abnormal   Collection Time: 03/29/20  7:41 AM  Result Value Ref Range   Glucose-Capillary 170 (H) 70 - 99 mg/dL    Comment: Glucose reference range applies only to samples taken after fasting for at least 8 hours.    Current Facility-Administered Medications  Medication Dose Route Frequency Provider Last Rate Last Admin  . acetaminophen (TYLENOL) tablet 650 mg  650 mg Oral Q6H Ghimire, Kuber, MD      . albuterol (VENTOLIN HFA) 108 (90 Base) MCG/ACT inhaler 2 puff  2 puff Inhalation Q6H PRN Marylyn Ishihara, Tyrone A, DO      . amitriptyline (ELAVIL) tablet 100 mg  100 mg Oral QHS Kyle, Tyrone A, DO   100 mg at 03/28/20 2147  . atorvastatin (LIPITOR) tablet 20 mg  20 mg Oral Daily Kyle, Tyrone A, DO   20 mg at 03/28/20 1208  . busPIRone (BUSPAR) tablet 10 mg  10 mg Oral BID Erick Colace, NP   10 mg at 03/28/20 2147  . carbamazepine (TEGRETOL) chewable tablet 200 mg  200 mg Oral BID Suella Broad, FNP   200 mg at 03/28/20 2147  . chlorhexidine (PERIDEX) 0.12 % solution 15 mL  15 mL Mouth Rinse BID Kyle, Tyrone A, DO   15 mL at 03/25/20 1047  . clopidogrel (PLAVIX) tablet 75 mg  75 mg Oral Daily Kyle, Tyrone A, DO   75 mg at 03/28/20 1207  . collagenase (SANTYL)  ointment   Topical Daily Patrecia Pour, MD   Given at 03/28/20 1627  . docusate sodium (COLACE) capsule 100 mg  100 mg Oral BID Cristal Generous, NP   100 mg at 03/28/20 2147  . furosemide (LASIX) injection 80 mg  80 mg Intravenous Daily Patrecia Pour, MD   80 mg at 03/28/20 1208  . haloperidol lactate (HALDOL) injection 2 mg  2 mg Intravenous Q6H PRN Barb Merino, MD   2 mg at 03/26/20 1527  . insulin aspart (novoLOG) injection 0-5 Units  0-5 Units Subcutaneous QHS Patrecia Pour, MD   2 Units at 03/24/20 2116  . insulin aspart (novoLOG) injection 0-6 Units  0-6 Units Subcutaneous TID WC Patrecia Pour, MD   1 Units at 03/29/20 [phone]  . insulin glargine (LANTUS) injection 10 Units  10 Units Subcutaneous QHS Patrecia Pour, MD   10 Units at 03/28/20 2148  . lipase/protease/amylase (CREON) capsule 36,000 Units  36,000 Units Oral TID PRN Minda Ditto, RPH      . lipase/protease/amylase (CREON) capsule 72,000 Units  72,000 Units Oral TID WC Green, Terri L, RPH   72,000 Units at 03/27/20 1019  . LORazepam (ATIVAN) injection 0.5 mg  0.5 mg Intravenous QHS PRN,MR X 1 Ghimire, Kuber, MD   0.5 mg at 03/27/20 0208  . MEDLINE mouth rinse  15 mL Mouth Rinse q12n4p Marylyn Ishihara, Tyrone A,  DO   15 mL at 03/28/20 1212  . metoprolol succinate (TOPROL-XL) 24 hr tablet 50 mg  50 mg Oral Daily Kyle, Tyrone A, DO   50 mg at 03/28/20 1207  . mometasone-formoterol (DULERA) 200-5 MCG/ACT inhaler 2 puff  2 puff Inhalation BID Marylyn Ishihara, Tyrone A, DO   2 puff at 03/23/20 2004  . OLANZapine (ZYPREXA) tablet 5 mg  5 mg Oral QHS Barb Merino, MD   5 mg at 03/28/20 2147  . potassium chloride SA (KLOR-CON) CR tablet 40 mEq  40 mEq Oral BID Marylyn Ishihara, Tyrone A, DO   40 mEq at 03/28/20 2147  . simethicone (MYLICON) chewable tablet 80 mg  80 mg Oral QID PRN Cristal Generous, NP   80 mg at 03/23/20 1634  . sodium chloride flush (NS) 0.9 % injection 10-40 mL  10-40 mL Intracatheter PRN Deno Etienne, DO        Musculoskeletal: Strength & Muscle Tone:  within normal limits Gait & Station: normal Patient leans: N/A  Psychiatric Specialty Exam: Physical Exam  Review of Systems  Blood pressure (!) 149/87, pulse 87, temperature (!) 97.3 F (36.3 C), resp. rate 13, height 5\' 6"  (1.676 m), weight 93.4 kg, SpO2 100 %.Body mass index is 33.23 kg/m.  General Appearance: Fairly Groomed  Eye Contact:  Minimal  Speech:  Slow and Slurred  Volume:  Normal  Mood:  Irritable  Affect:  Blunt, Labile and Full Range  Thought Process:  Disorganized, Irrelevant and Descriptions of Associations: Tangential  Orientation:  Other:  Alert and oriented to self only  Thought Content:  Logical, Illogical, Delusions and Rumination  Suicidal Thoughts:  No  Homicidal Thoughts:  No  Memory:  Immediate;   Poor Recent;   Poor Remote;   Poor  Judgement:  Poor  Insight:  Shallow  Psychomotor Activity:  Restlessness  Concentration:  Concentration: Poor and Attention Span: Poor  Recall:  Poor  Fund of Knowledge:  Poor  Language:  Fair  Akathisia:  No  Handed:  Right  AIMS (if indicated):     Assets:  Communication Skills Desire for Improvement Financial Resources/Insurance Housing Leisure Time Social Support  ADL's:  Impaired  Cognition:  Impaired,  Mild  Sleep:      Morgan Beasley is a 59 year old female with significant medical history to include cerebral edema, bipolar, polysubstance abuse, diabetes, cardiovascular disease who presents with altered mental status.  Patient is seen and observed to be lying in bed, confused, irritable, restless, and labile.  Patient is easily redirected, however unable to participate in evaluation as she continues to doze off in between questions and answers.  Patient would awake to answer question, and then not able to complete her sentence prior to falling asleep.  Psych consult was placed for agitation, however she does appear to be redirectable and will recommend continuing current medications.  She is currently  taking amitriptyline 100 mg p.o. nightly, buspirone 10 mg p.o. twice daily, Tegretol 200 mg p.o. twice daily, olanzapine 5 mg p.o. nightly.  Patient denies any suicidality, homicidality, and or auditory visual hallucinations.  Her current mentation does require ongoing safety concerns, and she continues to meet criteria for IVC.   Treatment Plan Summary: Plan Will continue her current medications with the exception of adjusting her buspirone to 10 mg p.o. 3 times daily.  She is currently taking Tegretol 200 mg p.o. twice daily, will obtain therapeutic level and adjust medication if possible.  Due to patient's current mentation, ongoing needs  for skilled services will recommend SNF or assisted living facility.   -Place order for carbamazepine level, with a goal of 10-15/mg to further target aggression, agitation, and disorientation. .-Please note that some agitation is normal, and patient behaviors to include agitation, aggression, and irritability are to be expected considering her encephalopathy, cerebral embolism with cerebral infarction, polysubstance abuse, and underlying mood disorder.  Her symptoms have improved since her admission although we have not obtain complete cessation of her neurobehavioral symptoms.  Will recommend we continue delirium precautions, safety sitter, and limit use of restraints as they can worsen agitation.  Also recommend getting patient up from bed to chair when she is alert and oriented to do so.   Disposition: Recommend geriatric psych facility, once patient is medically cleared and stable.  Recommend working closely with social work to facilitate inpatient admission due to ongoing neural behavioral symptoms, disorientation, aggression, and poor mentation  Suella Broad, FNP 03/29/2020 10:45 AM

## 2020-03-29 NOTE — Social Work (Signed)
MCEDCSW covering remotely received call from Amy at [facility] stating that they have no single female rooms today. Amy states that due to BiPap Pt would need single room.  Amy states that there is a possibility that single room might become available tomorrow, 03/30/20

## 2020-03-29 NOTE — Progress Notes (Signed)
PROGRESS NOTE    Morgan Beasley  ZOX:[id number] DOB: [birth year] DOA: 03/21/2020 PCP: Riki Sheer, NP    Brief Narrative:  59 year old female with recent hospitalization for line associated pulmonic valve endocarditis and Serratia bacteremia, multiple territory stroke who was discharged home after complicated hospitalization for agitation and delirium on 11/18 on cefepime through PICC line presented back on 11/21 with severe confusion, hallucinations and restlessness.  She was found to have pleural effusions, pulmonary edema.  Waxing and waning agitation delirium requiring Precedex in ICU. 11/23, Precedex off.  On multiple antipsychotic medications.  Remains agitated intermittently.  Patient has history of depression, bipolar disorder, cocaine use and alcoholism.  She was even found with alcohol with her when she was discharged from hospital with complicated hospitalization. Hospital course is complicated with ongoing intermittent agitation and underlying behavioral issues. 11/29, mental status slightly improved.  More mobility.  Awake but delusional.  Fall risk.   Assessment & Plan:   Principal Problem:   Acute metabolic encephalopathy Active Problems:   Cerebral embolism with cerebral infarction   Anasarca  Agitation, delirium and metabolic encephalopathy in a patient with known drug and alcohol use disorder and bipolar disorder.   Difficult to control symptoms. Was on precedex drip but now off.  Appreciate psychiatry input.  Reconsulted today. All-time fall precautions.  Delirium precautions.  Air cabin crew.  Renew soft restraints as much needed to keep IV lines and tubing safe as well as to keep patient safe. Currently on BuSpar, dose increased 10 mg TID Tegretol 200 mg BID Nighttime Zyprexa, increase dose to 5 mg at night.  Repeat QTC was 490. Nighttime amitriptyline Low-dose Ativan at night.  Continue supportive treatment, day and night cycle regulation.  Continue  safety sitter and soft restraints as needed to protect from fall and pulling on lines.  Acute hypoxemic respiratory failure secondary to multifactorial pulmonary edema and pleural effusions, acute CHF on chronic obstructive sleep apnea, underlying history of COPD: Intermittently diuresing.  Continue bronchodilators.  On metoprolol succinate. Known ejection fraction of 40%. Treated with IV Lasix.  Negative fluid balance of 10 L.  On room air. Change to oral Lasix.  Will check potassium in the morning.  History of coronary artery disease: She remains on Plavix, beta-blockers.  Currently no evidence of acute coronary syndrome.  Recent evolving right and chronic left occipital infarcts: MRI on 11/23 with remote left thalamic and basal ganglia microhemorrhages and chronic microvascular ischemic changes.  No evidence of new stroke.  Remains on Plavix and statin.  Chronic pancreatitis: On Creon.  Electrolyte abnormalities: Replaced and adequate.  Recheck levels tomorrow morning.  Type 2 diabetes: Controlled.  Remains on insulin in the hospital.  On oral hypoglycemics at home.  Blood sugars are fairly controlled today on Lantus and sliding scale.  Acute kidney injury on chronic kidney disease stage IIIa: Renal function fluctuates.  Need close monitoring on dialysis.  Stabilizing.  Anemia of chronic disease: Baseline hemoglobin about 8-9.  No evidence of bleeding.  Right supraclavicular and mediastinal adenopathy:  Patient has history of breast cancer treated with mastectomy.   Similar lymphadenopathy present on 10/20 20 and does not look like progressed in last 1 year.   Will need outpatient follow-up once patient is more stabilized.  Bowel and bladder functions adequate.    DVT prophylaxis: SCDs Start: 03/21/20 1629   Code Status: Full code. Family Communication: Patient's daughter [name] at the bedside.  Disposition Plan: Status is: Inpatient  Remains inpatient appropriate  because:Unsafe  d/c plan and Inpatient level of care appropriate due to severity of illness   Dispo: The patient is from: Home              Anticipated d/c is to: SNF, Geri psych if possible.              Anticipated d/c date is: 2 to 3 days.              Patient currently is not medically stable to d/c.  Consultants:   PCCM  ID  Psychiatry  Procedures:   None  Antimicrobials:   None   Subjective: Patient seen and examined.  Patient's daughter at the bedside.  Early morning she was mostly sleepy and will just awake to stimulation. I was called back to see the patient, she jumped up the bed and sat on the side of the bed and refused to go back to the bed.  Patient was more alert and awake, still delusional but able to have minimal conversation.  She looks comfortable.  Objective: Vitals:   03/28/20 2100 03/28/20 2318 03/29/20 0500 03/29/20 1251  BP: (!) 145/95 (!) 149/87  (!) 150/107  Pulse: 85 87  100  Resp: 13   17  Temp: (!) 97.1 F (36.2 C) (!) 97.3 F (36.3 C)  97.6 F (36.4 C)  TempSrc: Oral   Oral  SpO2: 90% 100%  97%  Weight:   93.4 kg   Height:        Intake/Output Summary (Last 24 hours) at 03/29/2020 1255 Last data filed at 03/29/2020 1014 Gross per 24 hour  Intake 130 ml  Output 451 ml  Net -321 ml   Filed Weights   03/27/20 0500 03/28/20 0500 03/29/20 0500  Weight: 97.1 kg 93.4 kg 93.4 kg    Examination:  General exam: Chronically sick looking female, not in any obvious distress. Respiratory system: Clear to auscultation. Respiratory effort normal. Cardiovascular system: S1 & S2 heard, RRR. Gastrointestinal system: Soft and nontender.   Central nervous system: No focal logical deficit. Patient with flat affect.  Anxious.  Occasional agitation but mostly delusional. Extremities: Moves all extremities. Skin: Chronic venous stasis changes on both legs.  No open ulcers.    Data Reviewed: I have personally reviewed following labs and imaging  studies  CBC: Recent Labs  Lab 03/23/20 0516 03/25/20 0239  WBC 6.6 8.3  NEUTROABS  --  5.2  HGB 9.1* 9.5*  HCT 30.6* 31.5*  MCV 82.7 82.9  PLT 328 509   Basic Metabolic Panel: Recent Labs  Lab 03/23/20 0516 03/24/20 0156 03/25/20 0239 03/26/20 0522 03/27/20 0605  NA 141 142 140 140 142  K 4.1 4.5 4.9 4.5 4.7  CL 103 103 104 103 101  CO2 28 27 26 27 29   GLUCOSE 93 148* 161* 110* 144*  BUN 22* 28* 30* 27* 24*  CREATININE 1.48* 1.67* 1.63* 1.34* 1.22*  CALCIUM 7.9* 8.2* 8.3* 8.6* 8.8*  MG  --  1.5* 1.9  --   --   PHOS  --   --  3.8  --   --    GFR: Estimated Creatinine Clearance: 57.8 mL/min (A) (by C-G formula based on SCr of 1.22 mg/dL (H)). Liver Function Tests: Recent Labs  Lab 03/23/20 0516 03/25/20 0239 03/26/20 0522 03/27/20 0605  AST 23 23 24 27   ALT 19 19 18 19   ALKPHOS 158* 226* 230* 246*  BILITOT 0.6 0.7 0.7 0.7  PROT 6.4* 7.1 7.2 7.8  ALBUMIN  2.2* 2.5* 2.5* 2.7*   No results for input(s): LIPASE, AMYLASE in the last 168 hours. Recent Labs  Lab 03/22/20 1645  AMMONIA 26   Coagulation Profile: No results for input(s): INR, PROTIME in the last 168 hours. Cardiac Enzymes: No results for input(s): CKTOTAL, CKMB, CKMBINDEX, TROPONINI in the last 168 hours. BNP (last 3 results) Recent Labs    12/19/19 1546 02/09/20 0825  PROBNP 1,148* 1,185*   HbA1C: No results for input(s): HGBA1C in the last 72 hours. CBG: Recent Labs  Lab 03/28/20 1146 03/28/20 1614 03/28/20 2149 03/29/20 0741 03/29/20 1225  GLUCAP 141* 171* 169* 170* 134*   Lipid Profile: No results for input(s): CHOL, HDL, LDLCALC, TRIG, CHOLHDL, LDLDIRECT in the last 72 hours. Thyroid Function Tests: No results for input(s): TSH, T4TOTAL, FREET4, T3FREE, THYROIDAB in the last 72 hours. Anemia Panel: No results for input(s): VITAMINB12, FOLATE, FERRITIN, TIBC, IRON, RETICCTPCT in the last 72 hours. Sepsis Labs: No results for input(s): PROCALCITON, LATICACIDVEN in the last  168 hours.  Recent Results (from the past 240 hour(s))  Urine culture     Status: Abnormal   Collection Time: 03/21/20  7:30 AM   Specimen: In/Out Cath Urine  Result Value Ref Range Status   Specimen Description   Final    IN/OUT CATH URINE Performed at [facility], [place] [address] [place], [place] [address]    Special Requests   Final    NONE Performed at [facility], [place] [address], [place], [place] [address]    Culture 1,000 COLONIES/mL ENTEROCOCCUS FAECIUM (A)  Final   Report Status 03/23/2020 FINAL  Final   Organism ID, Bacteria ENTEROCOCCUS FAECIUM (A)  Final      Susceptibility   Enterococcus faecium - MIC*    AMPICILLIN RESISTANT Resistant     NITROFURANTOIN 64 INTERMEDIATE Intermediate     VANCOMYCIN 1 SENSITIVE Sensitive     * 1,000 COLONIES/mL ENTEROCOCCUS FAECIUM  Resp Panel by RT-PCR (Flu A&B, Covid)     Status: None   Collection Time: 03/21/20  9:30 AM   Specimen: Nasopharyngeal(NP) swabs in vial transport medium  Result Value Ref Range Status   SARS Coronavirus 2 by RT PCR NEGATIVE NEGATIVE Final    Comment: (NOTE) SARS-CoV-2 target nucleic acids are NOT DETECTED.  The SARS-CoV-2 RNA is generally detectable in upper respiratory specimens during the acute phase of infection. The lowest concentration of SARS-CoV-2 viral copies this assay can detect is 138 copies/mL. A negative result does not preclude SARS-Cov-2 infection and should not be used as the sole basis for treatment or other patient management decisions. A negative result may occur with  improper specimen collection/handling, submission of specimen other than nasopharyngeal swab, presence of viral mutation(s) within the areas targeted by this assay, and inadequate number of viral copies(<138 copies/mL). A negative result must be combined with clinical observations, patient history, and epidemiological information. The expected result is Negative.  Fact Sheet  for Patients:  EntrepreneurPulse.com.au  Fact Sheet for Healthcare Providers:  IncredibleEmployment.be  This test is no t yet approved or cleared by the [place] FDA and  has been authorized for detection and/or diagnosis of SARS-CoV-2 by FDA under an Emergency Use Authorization (EUA). This EUA will remain  in effect (meaning this test can be used) for the duration of the COVID-19 declaration under Section 564(b)(1) of the Act, 21 U.S.C.section 360bbb-3(b)(1), unless the authorization is terminated  or revoked sooner.       Influenza A  by PCR NEGATIVE NEGATIVE Final   Influenza B by PCR NEGATIVE NEGATIVE Final    Comment: (NOTE) The Xpert Xpress SARS-CoV-2/FLU/RSV plus assay is intended as an aid in the diagnosis of influenza from Nasopharyngeal swab specimens and should not be used as a sole basis for treatment. Nasal washings and aspirates are unacceptable for Xpert Xpress SARS-CoV-2/FLU/RSV testing.  Fact Sheet for Patients: EntrepreneurPulse.com.au  Fact Sheet for Healthcare Providers: IncredibleEmployment.be  This test is not yet approved or cleared by the [place] FDA and has been authorized for detection and/or diagnosis of SARS-CoV-2 by FDA under an Emergency Use Authorization (EUA). This EUA will remain in effect (meaning this test can be used) for the duration of the COVID-19 declaration under Section 564(b)(1) of the Act, 21 U.S.C. section 360bbb-3(b)(1), unless the authorization is terminated or revoked.  Performed at [facility], [place] [address], [place], [place] [address]   Blood culture (routine x 2)     Status: None   Collection Time: 03/21/20  9:44 AM   Specimen: BLOOD LEFT HAND  Result Value Ref Range Status   Specimen Description   Final    BLOOD LEFT HAND Performed at [place] [address], [place], [place] [address]     Special Requests   Final    BOTTLES DRAWN AEROBIC AND ANAEROBIC Blood Culture results may not be optimal due to an inadequate volume of blood received in culture bottles Performed at [place] [address], [place], [place] [address]    Culture   Final    NO GROWTH 5 DAYS Performed at [facility], [place] [address], [place], [place] [address]    Report Status 03/26/2020 FINAL  Final  MRSA PCR Screening     Status: None   Collection Time: 03/22/20  5:26 AM   Specimen: Nasopharyngeal  Result Value Ref Range Status   MRSA by PCR NEGATIVE NEGATIVE Final    Comment:        The GeneXpert MRSA Assay (FDA approved for NASAL specimens only), is one component of a comprehensive MRSA colonization surveillance program. It is not intended to diagnose MRSA infection nor to guide or monitor treatment for MRSA infections. Performed at [facility], [place] [address] [place], [place] [address]          Radiology Studies: No results found.      Scheduled Meds: . acetaminophen  650 mg Oral Q6H  . amitriptyline  100 mg Oral QHS  . atorvastatin  20 mg Oral Daily  . busPIRone  10 mg Oral TID  . carbamazepine  200 mg Oral BID  . chlorhexidine  15 mL Mouth Rinse BID  . clopidogrel  75 mg Oral Daily  . collagenase   Topical Daily  . docusate sodium  100 mg Oral BID  . furosemide  40 mg Oral BID  . insulin aspart  0-5 Units Subcutaneous QHS  . insulin aspart  0-6 Units Subcutaneous TID WC  . insulin glargine  10 Units Subcutaneous QHS  . lipase/protease/amylase  72,000 Units Oral TID WC  . mouth rinse  15 mL Mouth Rinse q12n4p  . metoprolol succinate  50 mg Oral Daily  . mometasone-formoterol  2 puff Inhalation BID  . nicotine  14 mg Transdermal Daily  . OLANZapine  5 mg Oral QHS  . potassium chloride  40 mEq Oral BID   Continuous Infusions:    LOS: 8 days    Time  spent: 30 minutes    Barb Merino, MD [facility] Pager [phone]

## 2020-03-29 NOTE — Care Management Important Message (Signed)
Important Message  Patient Details IM Letter given to the Patient. Name: Morgan Beasley MRN: [id number] Date of Birth: 1961-01-18   Medicare Important Message Given:  Yes     Kerin Salen 03/29/2020, 11:20 AM

## 2020-03-30 LAB — GLUCOSE, CAPILLARY
Glucose-Capillary: 153 mg/dL — ABNORMAL HIGH (ref 70–99)
Glucose-Capillary: 139 mg/dL — ABNORMAL HIGH (ref 70–99)
Glucose-Capillary: 162 mg/dL — ABNORMAL HIGH (ref 70–99)
Glucose-Capillary: 161 mg/dL — ABNORMAL HIGH (ref 70–99)

## 2020-03-30 LAB — BASIC METABOLIC PANEL
Anion gap: 11 (ref 5–15)
BUN: 40 mg/dL — ABNORMAL HIGH (ref 6–20)
CO2: 28 mmol/L (ref 22–32)
Calcium: 8.6 mg/dL — ABNORMAL LOW (ref 8.9–10.3)
Chloride: 101 mmol/L (ref 98–111)
Creatinine, Ser: 1.87 mg/dL — ABNORMAL HIGH (ref 0.44–1.00)
GFR, Estimated: 31 mL/min — ABNORMAL LOW (ref >60)
Glucose, Bld: 166 mg/dL — ABNORMAL HIGH (ref 70–99)
Potassium: 5.5 mmol/L — ABNORMAL HIGH (ref 3.5–5.1)
Sodium: 140 mmol/L (ref 135–145)

## 2020-03-30 LAB — CARBAMAZEPINE LEVEL, TOTAL: Carbamazepine Lvl: 4.9 ug/mL (ref 4.0–12.0)

## 2020-03-30 LAB — MAGNESIUM: Magnesium: 1.5 mg/dL — ABNORMAL LOW (ref 1.7–2.4)

## 2020-03-30 MED ORDER — HALOPERIDOL 5 MG PO TABS
5.0000 mg | ORAL_TABLET | Freq: Four times a day (QID) | ORAL | Status: DC | PRN
  Administered 2020-04-02 – 2020-04-03 (×3): 5 mg via ORAL
  Filled 2020-03-30 (×5): qty 1

## 2020-03-30 MED ORDER — MAGNESIUM SULFATE 2 GM/50ML IV SOLN
2.0000 g | Freq: Once | INTRAVENOUS | Status: AC
  Administered 2020-03-30: 2 g via INTRAVENOUS
  Filled 2020-03-30: qty 50

## 2020-03-30 MED ORDER — LORAZEPAM 1 MG PO TABS
1.0000 mg | ORAL_TABLET | Freq: Every evening | ORAL | Status: DC | PRN
  Administered 2020-03-30 – 2020-04-02 (×4): 1 mg via ORAL
  Filled 2020-03-30 (×5): qty 1

## 2020-03-30 NOTE — Progress Notes (Signed)
This patient has history of sleep apnea, she is noncompliant and did not use CPAP in the past.  She has not been using CPAP in this hospitalization. Until patient is able to cooperate and her psychiatric issues controlled, she will not be prescribed CPAP to use.

## 2020-03-30 NOTE — TOC Progression Note (Signed)
Transition of Care [facility]) - Progression Note    Patient Details  Name: Morgan Beasley MRN: [id number] Date of Birth: [birth year]  [facility] CM/SW Contact  Hayzlee Mcsorley, [name], RN Phone Number: 03/30/2020, 9:43 AM  Clinical Narrative: E-Faxed again to [facility]-per prior CSW  note that they might have a bed.      [facility]: [facility] Barriers to Discharge: No Barriers Identified  Expected Discharge Plan and Services Expected Discharge Plan: [facility]   Discharge Planning Services: CM Consult Post Acute Care Choice: [place] Living arrangements for the past 2 months: [place] Expected Discharge Date:  (unknown)                                     Social Determinants of Health (SDOH) Interventions    Readmission Risk Interventions Readmission Risk Prevention Plan 03/18/2020 10/21/2019  Transportation Screening Complete Complete  Medication Review (RN Care Manager) - Complete  PCP or Specialist appointment within 3-5 days of discharge Not Complete Complete  PCP/Specialist Appt Not Complete comments First available appt 12/1 -  [place] or Home Care Consult Complete Complete  SW Recovery Care/Counseling Consult Complete Complete  Palliative Care Screening Not Applicable Not [place] Not Applicable Not Applicable  Some recent data might be hidden

## 2020-03-30 NOTE — Progress Notes (Signed)
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  She was found to have pleural effusions, pulmonary edema.  Waxing and waning agitation delirium requiring Precedex in ICU. 11/23, Precedex off.  On multiple antipsychotic medications.  Remains agitated intermittently.  Patient has history of depression, bipolar disorder, cocaine use and alcoholism.   Hospital course complicated with ongoing intermittent agitation and underlying behavioral issues. 11/29, mental status slightly improved.  More mobility.  Awake but delusional. Seen by psychiatry and they renewed IVC. Recommended inpatient psych admissions at the [facility] unit.   Assessment & Plan:   Principal Problem:   Acute metabolic encephalopathy Active Problems:   Cerebral embolism with cerebral infarction   Anasarca  Agitation, delirium and metabolic encephalopathy in a patient with known drug and alcohol use disorder and bipolar disorder.   Gradual improvement of symptoms.  Appreciate psychiatric input.   Fall precautions.  Delirium precautions.  Air cabin crew.   BuSpar, dose increased 10 mg TID Tegretol 200 mg BID Nighttime Zyprexa, increase dose to 5 mg at night.  Repeat QTC was 490. Nighttime amitriptyline Low-dose Ativan at night.  Change to oral in the anticipation of discharge.  Change to oral Haldol as needed in anticipation of discharge. Continue supportive treatment, day and night cycle regulation.  Continue safety sitter and soft restraints as needed to protect from fall and  injury.  Acute hypoxemic respiratory failure secondary to multifactorial pulmonary edema and pleural effusions, acute CHF on chronic obstructive sleep apnea, underlying history of COPD: On oral Lasix.  Improved.  Will continue on Lasix. Known ejection fraction of 40%. On metoprolol.  Mostly remains on room air.  History of coronary artery disease: She remains on Plavix, beta-blockers.  Currently no evidence of acute coronary syndrome.  Recent evolving right and chronic left occipital infarcts: MRI on 11/23 with remote left thalamic and basal ganglia microhemorrhages and chronic microvascular ischemic changes.  No evidence of new stroke.  Remains on Plavix and statin.  Chronic pancreatitis: On Creon.  Electrolyte abnormalities: Potassium is adequate.  Discontinue.  Replace magnesium today.  Type 2 diabetes: Controlled.  Remains on insulin in the hospital.  On oral hypoglycemics at home.  Blood sugars are fairly controlled on Lantus and sliding scale.  Acute kidney injury on chronic kidney disease stage IIIa: Renal function fluctuates.  Need close monitoring on dialysis.  Stabilizing.  At about baseline.  Anemia of chronic disease: Baseline hemoglobin about 8-9.  No evidence of bleeding.  Right supraclavicular and mediastinal adenopathy:  Patient has history of breast cancer treated with mastectomy.   Similar lymphadenopathy present on 10/20 20 and does not look like progressed in last 1 year.   Will need outpatient follow-up once patient is more stabilized.  Bowel and bladder functions adequate.   Continue to mobilize with PT OT.  Change to oral medications in anticipation of discharge. Refer to [facility]. Transfer to [facility] when bed is available.  Continue IVC.   DVT prophylaxis: SCDs Start: 03/21/20 1629   Code Status: Full code. Family Communication: Patient's daughter [name] on the phone. Disposition Plan: Status is: Inpatient  Remains inpatient appropriate  because:Unsafe  d/c plan and Inpatient level of care appropriate due to severity of illness   Dispo: The patient is from: Home              Anticipated d/c is to: [facility] psych if possible.              Anticipated d/c date is: When bed available.              Patient currently is medically stable to transfer to psychiatric hospital.  Consultants:   PCCM  ID  Psychiatry  Procedures:   None  Antimicrobials:   None   Subjective: Patient seen and examined.  Today she is more awake and able to answer simple questions.  Still has some delusions. She was able to participate well with physical therapy.  Objective: Vitals:   03/29/20 2117 03/30/20 0326 03/30/20 1040 03/30/20 1404  BP:  (!) 105/46 (!) 154/115 (!) 148/108  Pulse:  (!) 102 98 79  Resp:  20  14  Temp:  97.6 F (36.4 C)  97.8 F (36.6 C)  TempSrc:  Oral  Oral  SpO2: 98% (!) 86%    Weight:      Height:        Intake/Output Summary (Last 24 hours) at 03/30/2020 1553 Last data filed at 03/30/2020 0945 Gross per 24 hour  Intake 60 ml  Output 175 ml  Net -115 ml   Filed Weights   03/27/20 0500 03/28/20 0500 03/29/20 0500  Weight: 97.1 kg 93.4 kg 93.4 kg    Examination:  General exam: Chronically sick looking female, not in any obvious distress. Respiratory system: Clear to auscultation. Respiratory effort normal.  On room air. Cardiovascular system: S1 & S2 heard, RRR. Gastrointestinal system: Soft and nontender.   Central nervous system: No focal logical deficit. Patient with flat affect.  Intermittently anxious and occasionally agitated.  Delusional.  Extremities: Moves all extremities. Skin: Chronic venous stasis changes on both legs.  No open ulcers.    Data Reviewed: I have personally reviewed following labs and imaging studies  CBC: Recent Labs  Lab 03/25/20 0239  WBC 8.3  NEUTROABS 5.2  HGB 9.5*  HCT 31.5*  MCV 82.9  PLT 540   Basic Metabolic Panel: Recent Labs  Lab 03/24/20 0156  03/25/20 0239 03/26/20 0522 03/27/20 0605 03/30/20 1057  NA 142 140 140 142 140  K 4.5 4.9 4.5 4.7 5.5*  CL 103 104 103 101 101  CO2 27 26 27 29 28   GLUCOSE 148* 161* 110* 144* 166*  BUN 28* 30* 27* 24* 40*  CREATININE 1.67* 1.63* 1.34* 1.22* 1.87*  CALCIUM 8.2* 8.3* 8.6* 8.8* 8.6*  MG 1.5* 1.9  --   --  1.5*  PHOS  --  3.8  --   --   --    GFR: Estimated Creatinine Clearance: 37.7 mL/min (A) (by C-G formula based on SCr of 1.87 mg/dL (H)). Liver Function Tests: Recent Labs  Lab 03/25/20 0239 03/26/20 0522 03/27/20 0605  AST 23 24 27   ALT 19 18 19   ALKPHOS 226* 230* 246*  BILITOT 0.7 0.7 0.7  PROT 7.1 7.2 7.8  ALBUMIN 2.5* 2.5* 2.7*   No results for input(s): LIPASE, AMYLASE in the last 168 hours. No results for input(s): AMMONIA in the last 168 hours. Coagulation Profile: No results for input(s): INR, PROTIME in the last 168 hours. Cardiac Enzymes: No results for input(s): CKTOTAL, CKMB, CKMBINDEX, TROPONINI in the last 168 hours. BNP (last 3 results) Recent  Labs    12/19/19 1546 02/09/20 0825  PROBNP 1,148* 1,185*   HbA1C: No results for input(s): HGBA1C in the last 72 hours. CBG: Recent Labs  Lab 03/29/20 1225 03/29/20 1630 03/29/20 2054 03/30/20 0804 03/30/20 1113  GLUCAP 134* 141* 179* 153* 139*   Lipid Profile: No results for input(s): CHOL, HDL, LDLCALC, TRIG, CHOLHDL, LDLDIRECT in the last 72 hours. Thyroid Function Tests: No results for input(s): TSH, T4TOTAL, FREET4, T3FREE, THYROIDAB in the last 72 hours. Anemia Panel: No results for input(s): VITAMINB12, FOLATE, FERRITIN, TIBC, IRON, RETICCTPCT in the last 72 hours. Sepsis Labs: No results for input(s): PROCALCITON, LATICACIDVEN in the last 168 hours.  Recent Results (from the past 240 hour(s))  Urine culture     Status: Abnormal   Collection Time: 03/21/20  7:30 AM   Specimen: In/Out Cath Urine  Result Value Ref Range Status   Specimen Description   Final    IN/OUT CATH  URINE Performed at [facility], [place] [address] [place], [place] [address]    Special Requests   Final    NONE Performed at [facility], [place] [address], [place], [place] [address]    Culture 1,000 COLONIES/mL ENTEROCOCCUS FAECIUM (A)  Final   Report Status 03/23/2020 FINAL  Final   Organism ID, Bacteria ENTEROCOCCUS FAECIUM (A)  Final      Susceptibility   Enterococcus faecium - MIC*    AMPICILLIN RESISTANT Resistant     NITROFURANTOIN 64 INTERMEDIATE Intermediate     VANCOMYCIN 1 SENSITIVE Sensitive     * 1,000 COLONIES/mL ENTEROCOCCUS FAECIUM  Resp Panel by RT-PCR (Flu A&B, Covid)     Status: None   Collection Time: 03/21/20  9:30 AM   Specimen: Nasopharyngeal(NP) swabs in vial transport medium  Result Value Ref Range Status   SARS Coronavirus 2 by RT PCR NEGATIVE NEGATIVE Final    Comment: (NOTE) SARS-CoV-2 target nucleic acids are NOT DETECTED.  The SARS-CoV-2 RNA is generally detectable in upper respiratory specimens during the acute phase of infection. The lowest concentration of SARS-CoV-2 viral copies this assay can detect is 138 copies/mL. A negative result does not preclude SARS-Cov-2 infection and should not be used as the sole basis for treatment or other patient management decisions. A negative result may occur with  improper specimen collection/handling, submission of specimen other than nasopharyngeal swab, presence of viral mutation(s) within the areas targeted by this assay, and inadequate number of viral copies(<138 copies/mL). A negative result must be combined with clinical observations, patient history, and epidemiological information. The expected result is Negative.  Fact Sheet for Patients:  EntrepreneurPulse.com.au  Fact Sheet for Healthcare Providers:  IncredibleEmployment.be  This test is no t yet approved or cleared by the [place] FDA and  has been authorized for  detection and/or diagnosis of SARS-CoV-2 by FDA under an Emergency Use Authorization (EUA). This EUA will remain  in effect (meaning this test can be used) for the duration of the COVID-19 declaration under Section 564(b)(1) of the Act, 21 U.S.C.section 360bbb-3(b)(1), unless the authorization is terminated  or revoked sooner.       Influenza A by PCR NEGATIVE NEGATIVE Final   Influenza B by PCR NEGATIVE NEGATIVE Final    Comment: (NOTE) The Xpert Xpress SARS-CoV-2/FLU/RSV plus assay is intended as an aid in the diagnosis of influenza from Nasopharyngeal swab specimens and should not be used as a sole basis for treatment. Nasal washings and aspirates are unacceptable for Xpert Xpress SARS-CoV-2/FLU/RSV testing.  Fact Sheet for Patients: EntrepreneurPulse.com.au  Fact Sheet for Healthcare Providers: IncredibleEmployment.be  This test is not yet approved or cleared by the [place] FDA and has been authorized for detection and/or diagnosis of SARS-CoV-2 by FDA under an Emergency Use Authorization (EUA). This EUA will remain in effect (meaning this test can be used) for the duration of the COVID-19 declaration under Section 564(b)(1) of the Act, 21 U.S.C. section 360bbb-3(b)(1), unless the authorization is terminated or revoked.  Performed at [facility], [place] [address] [place], [place] [address]   Blood culture (routine x 2)     Status: None   Collection Time: 03/21/20  9:44 AM   Specimen: BLOOD LEFT HAND  Result Value Ref Range Status   Specimen Description   Final    BLOOD LEFT HAND Performed at [place] [address] [place], [place] [address]    Special Requests   Final    BOTTLES DRAWN AEROBIC AND ANAEROBIC Blood Culture results may not be optimal due to an inadequate volume of blood received in culture bottles Performed at [place] [address],  [place], [place] [address]    Culture   Final    NO GROWTH 5 DAYS Performed at [facility], [place] [address], [place], [place] [address]    Report Status 03/26/2020 FINAL  Final  MRSA PCR Screening     Status: None   Collection Time: 03/22/20  5:26 AM   Specimen: Nasopharyngeal  Result Value Ref Range Status   MRSA by PCR NEGATIVE NEGATIVE Final    Comment:        The GeneXpert MRSA Assay (FDA approved for NASAL specimens only), is one component of a comprehensive MRSA colonization surveillance program. It is not intended to diagnose MRSA infection nor to guide or monitor treatment for MRSA infections. Performed at [facility], [place] [address] [place], [place] [address]          Radiology Studies: No results found.      Scheduled Meds: . acetaminophen  650 mg Oral Q6H  . amitriptyline  100 mg Oral QHS  . atorvastatin  20 mg Oral Daily  . busPIRone  10 mg Oral TID  . carbamazepine  200 mg Oral BID  . chlorhexidine  15 mL Mouth Rinse BID  . clopidogrel  75 mg Oral Daily  . collagenase   Topical Daily  . docusate sodium  100 mg Oral BID  . furosemide  40 mg Oral BID  . insulin aspart  0-5 Units Subcutaneous QHS  . insulin aspart  0-6 Units Subcutaneous TID WC  . insulin glargine  10 Units Subcutaneous QHS  . lipase/protease/amylase  72,000 Units Oral TID WC  . mouth rinse  15 mL Mouth Rinse q12n4p  . metoprolol succinate  50 mg Oral Daily  . mometasone-formoterol  2 puff Inhalation BID  . nicotine  14 mg Transdermal Daily  . OLANZapine  5 mg Oral QHS   Continuous Infusions: . magnesium sulfate bolus IVPB       LOS: 9 days    Time spent: 30 minutes    Barb Merino, MD [facility] Pager [phone]

## 2020-03-30 NOTE — Progress Notes (Signed)
MD Ghimire notified for IVC order renewal. Awaiting order/response.

## 2020-03-30 NOTE — Progress Notes (Signed)
Pt refused to take pm medications. Only medications given was insulin.  Tried to redirect pt to take medications but pt became irritated.

## 2020-03-30 NOTE — Plan of Care (Signed)
  Problem: Clinical Measurements: Goal: Diagnostic test results will improve Outcome: Progressing Goal: Respiratory complications will improve Outcome: Progressing Goal: Cardiovascular complication will be avoided Outcome: Progressing   Problem: Elimination: Goal: Will not experience complications related to bowel motility Outcome: Progressing Goal: Will not experience complications related to urinary retention Outcome: Progressing   Problem: Pain Managment: Goal: General experience of comfort will improve Outcome: Progressing   Problem: Safety: Goal: Ability to remain free from injury will improve Outcome: Progressing   Problem: Skin Integrity: Goal: Risk for impaired skin integrity will decrease Outcome: Progressing   Problem: Education: Goal: Ability to incorporate positive changes in behavior to improve self-esteem will improve Outcome: Progressing   Problem: Health Behavior/Discharge Planning: Goal: Ability to identify and utilize available resources and services will improve Outcome: Progressing Goal: Ability to remain free from injury will improve Outcome: Progressing   Problem: Self-Concept: Goal: Will verbalize positive feelings about self Outcome: Progressing   Problem: Skin Integrity: Goal: Demonstration of wound healing without infection will improve Outcome: Progressing   Problem: Cardiac: Goal: Ability to achieve and maintain adequate cardiopulmonary perfusion will improve Outcome: Progressing   Problem: Education: Goal: Knowledge of General Education information will improve Description: Including pain rating scale, medication(s)/side effects and non-pharmacologic comfort measures Outcome: Not Progressing   Problem: Health Behavior/Discharge Planning: Goal: Ability to manage health-related needs will improve Outcome: Not Progressing   Problem: Clinical Measurements: Goal: Ability to maintain clinical measurements within normal limits will  improve Outcome: Not Progressing Goal: Will remain free from infection Outcome: Not Progressing   Problem: Nutrition: Goal: Adequate nutrition will be maintained Outcome: Not Progressing   Problem: Education: Goal: Ability to demonstrate management of disease process will improve Outcome: Not Progressing Goal: Ability to verbalize understanding of medication therapies will improve Outcome: Not Progressing   Problem: Activity: Goal: Capacity to carry out activities will improve Outcome: Not Progressing   Problem: Education: Goal: Individualized Educational Video(s) Outcome: Completed/Met

## 2020-03-30 NOTE — Progress Notes (Signed)
Physical Therapy Treatment Patient Details Name: Morgan Beasley MRN: [id number] DOB: 11/01/1960 Today's Date: 03/30/2020    History of Present Illness Pt admitted from home 03/21/20  and dx with acute respiratory failure 2* pleural effusions and pulmonary edema as well as acute metabolic encephalopathy and cerebral embolism with cerebral infarction.  Pt with hx of PAD, Lymphadema, DM, diabetic neuropathy, COPD, CKD, polysubstance abuse, bipolar, and fem-pop bypass graft.    PT Comments    Progressing with mobility. Pt participated well today with encouragement.    Follow Up Recommendations  SNF (vs geri-psych???)     Equipment Recommendations  None recommended by PT    Recommendations for Other Services       Precautions / Restrictions Precautions Precautions: Fall Precaution Comments: L foot wound    Mobility  Bed Mobility Overal bed mobility: Needs Assistance Bed Mobility: Supine to Sit     Supine to sit: Min assist     General bed mobility comments: Multimodal cues to gain pt participation and physical assist to bring trunk to upright  Transfers Overall transfer level: Needs assistance Equipment used: Rolling walker (2 wheeled) Transfers: Sit to/from Stand Sit to Stand: Min assist;+2 safety/equipment         General transfer comment: Cues for safety, hand placement. Assist to power up, stabilize, control descent.  Ambulation/Gait Ambulation/Gait assistance: Min assist;+2 safety/equipment Gait Distance (Feet): 60 Feet Assistive device: Rolling walker (2 wheeled) Gait Pattern/deviations: Step-through pattern;Decreased stride length     General Gait Details: Assist to steady pt throughout distance. Tendency to veer off to R side. Cues for safety. Followed with recliner. Remained on [place] O2 but unable to get pulse ox reading.   Stairs             Wheelchair Mobility    Modified Rankin (Stroke Patients Only)       Balance Overall balance  assessment: Needs assistance         Standing balance support: Bilateral upper extremity supported Standing balance-Leahy Scale: Poor                              Cognition Arousal/Alertness: Awake/alert Behavior During Therapy: WFL for tasks assessed/performed Overall Cognitive Status: No family/caregiver present to determine baseline cognitive functioning                         Following Commands: Follows one step commands with increased time     Problem Solving: Decreased initiation;Requires verbal cues;Requires tactile cues General Comments: focused on food.      Exercises      General Comments        Pertinent Vitals/Pain Pain Assessment: Faces Faces Pain Scale: Hurts little more Pain Location: legs/feet Pain Descriptors / Indicators: Discomfort;Sore Pain Intervention(s): Limited activity within patient's tolerance;Monitored during session;Repositioned    Home Living                      Prior Function            PT Goals (current goals can now be found in the care plan section) Progress towards PT goals: Progressing toward goals    Frequency           PT Plan Current plan remains appropriate    Co-evaluation              AM-PAC PT "6 Clicks" Mobility   Outcome Measure  Help needed turning from your back to your side while in a flat bed without using bedrails?: A Little Help needed moving from lying on your back to sitting on the side of a flat bed without using bedrails?: A Lot Help needed moving to and from a bed to a chair (including a wheelchair)?: A Little Help needed standing up from a chair using your arms (e.g., wheelchair or bedside chair)?: A Little Help needed to walk in hospital room?: A Little Help needed climbing 3-5 steps with a railing? : A Lot 6 Click Score: 16    End of Session Equipment Utilized During Treatment: Gait belt Activity Tolerance: Patient tolerated treatment well Patient  left: in chair;with call bell/phone within reach;with nursing/sitter in room   PT Visit Diagnosis: Muscle weakness (generalized) (M62.81);Unsteadiness on feet (R26.81)     Time: 1410-1430 PT Time Calculation (min) (ACUTE ONLY): 20 min  Charges:  $Gait Training: 8-22 mins                        Doreatha Massed, PT Acute Rehabilitation  Office: [phone] Pager: [phone]

## 2020-03-31 LAB — BASIC METABOLIC PANEL
Anion gap: 14 (ref 5–15)
BUN: 43 mg/dL — ABNORMAL HIGH (ref 6–20)
CO2: 27 mmol/L (ref 22–32)
Calcium: 8.7 mg/dL — ABNORMAL LOW (ref 8.9–10.3)
Chloride: 102 mmol/L (ref 98–111)
Creatinine, Ser: 1.57 mg/dL — ABNORMAL HIGH (ref 0.44–1.00)
GFR, Estimated: 38 mL/min — ABNORMAL LOW (ref >60)
Glucose, Bld: 155 mg/dL — ABNORMAL HIGH (ref 70–99)
Potassium: 4.4 mmol/L (ref 3.5–5.1)
Sodium: 143 mmol/L (ref 135–145)

## 2020-03-31 LAB — GLUCOSE, CAPILLARY
Glucose-Capillary: 137 mg/dL — ABNORMAL HIGH (ref 70–99)
Glucose-Capillary: 153 mg/dL — ABNORMAL HIGH (ref 70–99)
Glucose-Capillary: 160 mg/dL — ABNORMAL HIGH (ref 70–99)
Glucose-Capillary: 132 mg/dL — ABNORMAL HIGH (ref 70–99)

## 2020-03-31 MED ORDER — HALOPERIDOL LACTATE 5 MG/ML IJ SOLN
5.0000 mg | Freq: Once | INTRAMUSCULAR | Status: AC
  Administered 2020-03-31: 5 mg via INTRAMUSCULAR
  Filled 2020-03-31: qty 1

## 2020-03-31 MED ORDER — LORAZEPAM 2 MG/ML IJ SOLN
1.0000 mg | Freq: Once | INTRAMUSCULAR | Status: AC
  Administered 2020-03-31: 1 mg via INTRAVENOUS
  Filled 2020-03-31: qty 1

## 2020-03-31 MED ORDER — HALOPERIDOL LACTATE 5 MG/ML IJ SOLN: 5.0000 mg | Freq: Once | INTRAMUSCULAR | Status: DC

## 2020-03-31 MED ORDER — FUROSEMIDE 40 MG PO TABS
40.0000 mg | ORAL_TABLET | Freq: Every day | ORAL | Status: DC
  Administered 2020-03-31 – 2020-04-19 (×18): 40 mg via ORAL
  Filled 2020-03-31 (×21): qty 1

## 2020-03-31 NOTE — Progress Notes (Signed)
Per new order IM Haldol IM  X 1 dose. Pt remains agitated and attempting to get out of bed. Pt pushing staff away and trying to grab at staff. Maintain current plan of care and all safety precautions

## 2020-03-31 NOTE — TOC Progression Note (Addendum)
Transition of Care [facility]) - Progression Note    Patient Details  Name: Morgan Beasley MRN: [id number] Date of Birth: [birth year]  [facility] CM/SW Contact  Dieter Hane, [name], RN Phone Number: 03/31/2020, 2:44 PM  Clinical Narrative: IVC papers @ nsg station for [employer] to serve.Faxed out to [place] facilities-no facility willing to accept currently-supv also aware.Noted facilities had concerns about cpap-they have limited rooms available-it noted patient is not using cpap, it will not be ordered.There are also SNF bed offers-will pursue if patient becomes more appropriate for SNF.  3:30p-IVC'd for 7 days-will end on 12/7 .     Expected Discharge Plan: Psychiatric Hospital Barriers to Discharge: Continued Medical Work up  Expected Discharge Plan and Services Expected Discharge Plan: [facility]   Discharge Planning Services: CM Consult Post Acute Care Choice: [place] Living arrangements for the past 2 months: [place] Expected Discharge Date:  (unknown)                                     Social Determinants of Health (SDOH) Interventions    Readmission Risk Interventions Readmission Risk Prevention Plan 03/18/2020 10/21/2019  Transportation Screening Complete Complete  Medication Review [employer]) - Complete  PCP or Specialist appointment within 3-5 days of discharge Not Complete Complete  PCP/Specialist Appt Not Complete comments First available appt 12/1 -  [place] or Home Care Consult Complete Complete  SW Recovery Care/Counseling Consult Complete Complete  Palliative Care Screening Not Applicable Not [place] Not Applicable Not Applicable  Some recent data might be hidden

## 2020-03-31 NOTE — Progress Notes (Signed)
At this time Pt is resting quietly in bed and agitation has decreased with no recent attempts to get out of bed and no aggression to staff noted at this time. Restraints were not initiated due to Pt's decreased agitation and aggression

## 2020-03-31 NOTE — Progress Notes (Signed)
PROGRESS NOTE    Morgan Beasley  VEL:[id number] DOB: [birth year] DOA: 03/21/2020 PCP: Riki Sheer, NP    Brief Narrative:  59 year old female with recent hospitalization for line associated pulmonic valve endocarditis and Serratia bacteremia, multiple territory stroke who was discharged home after complicated hospitalization for agitation and delirium on 11/18 on cefepime through PICC line presented back on 11/21 with severe confusion, hallucinations and restlessness.  She was found to have pleural effusions, pulmonary edema.  Waxing and waning agitation delirium requiring Precedex in ICU. 11/23, Precedex off.  On multiple antipsychotic medications.  Remains agitated intermittently.  Patient has history of depression, bipolar disorder, cocaine use and alcoholism.   Hospital course complicated with ongoing intermittent agitation and underlying behavioral issues. 11/29, mental status slightly improved.  More mobility.  Awake but delusional. Seen by psychiatry and they renewed IVC. Recommended inpatient psych admissions at the [facility] unit. Patient is intermittently on oxygen.  She does not use CPAP or BiPAP. Her behavior is somehow improving, still remains delusional.   Assessment & Plan:   Principal Problem:   Acute metabolic encephalopathy Active Problems:   Cerebral embolism with cerebral infarction   Anasarca  Agitation, delirium and metabolic encephalopathy in a patient with known drug and alcohol use disorder and bipolar disorder.   Gradual improvement of symptoms.  Appreciate psychiatric input.   Fall precautions.  Delirium precautions.  Air cabin crew.   BuSpar, dose increased 10 mg TID Tegretol 200 mg BID Nighttime Zyprexa, increase dose to 5 mg at night.  Repeat QTC was 490. Nighttime amitriptyline Low-dose Ativan at night.  Change to oral in the anticipation of discharge.  Change to oral Haldol as needed in anticipation of discharge. Continue supportive  treatment, day and night cycle regulation.  Continue safety sitter and soft restraints as needed to protect from fall and injury.  Acute hypoxemic respiratory failure secondary to multifactorial pulmonary edema and pleural effusions, acute CHF on chronic obstructive sleep apnea, underlying history of COPD: On oral Lasix.  Improved.  Will continue on Lasix. Known ejection fraction of 40%. On metoprolol.  Mostly remains on room air. Check renal functions today.  History of coronary artery disease: She remains on Plavix, beta-blockers.  Currently no evidence of acute coronary syndrome.  Recent evolving right and chronic left occipital infarcts: MRI on 11/23 with remote left thalamic and basal ganglia microhemorrhages and chronic microvascular ischemic changes.  No evidence of new stroke.  Remains on Plavix and statin.  Chronic pancreatitis: On Creon.  Electrolyte abnormalities: Potassium is adequate.  Discontinue.  Replace magnesium.  Type 2 diabetes: Controlled.  Remains on insulin in the hospital.  On oral hypoglycemics at home.  Blood sugars are fairly controlled on Lantus and sliding scale.  Acute kidney injury on chronic kidney disease stage IIIa: Renal function fluctuates.  Need close monitoring on dialysis.  Stabilizing.  At about baseline. Check renal function today.  Check potassium levels.  Anemia of chronic disease: Baseline hemoglobin about 8-9.  No evidence of bleeding.  Right supraclavicular and mediastinal adenopathy:  Patient has history of breast cancer treated with mastectomy.   Similar lymphadenopathy present on 10/20 20 and does not look like progressed in last 1 year.   Will need outpatient follow-up once patient is more stabilized.  Bowel and bladder functions adequate.   Continue to mobilize with PT OT.  Change to oral medications in anticipation of discharge. Refer to [facility] Transfer to [place] when bed is available.  Continue IVC.  DVT prophylaxis:  SCDs Start: 03/21/20 1629   Code Status: Full code. Family Communication: Patient's daughter [name] at the bedside. Disposition Plan: Status is: Inpatient  Remains inpatient appropriate because:Unsafe d/c plan and Inpatient level of care appropriate due to severity of illness   Dispo: The patient is from: Home              Anticipated d/c is to: [facility] psych when possible.              Anticipated d/c date is: When bed available.              Patient currently is medically stable to transfer to psychiatric hospital.  Consultants:   PCCM  ID  Psychiatry  Procedures:   None  Antimicrobials:   None   Subjective: Seen and examined.  No overnight events.  Intermittently refuses care.  Objective: Vitals:   03/30/20 1925 03/30/20 2159 03/31/20 0656 03/31/20 0759  BP:  (!) 146/88 (!) 155/99   Pulse:  96 (!) 106   Resp:   20   Temp:  97.9 F (36.6 C)    TempSrc:  Oral    SpO2: 98% 100% 99% 100%  Weight:   92 kg   Height:        Intake/Output Summary (Last 24 hours) at 03/31/2020 1104 Last data filed at 03/31/2020 0700 Gross per 24 hour  Intake 104.4 ml  Output 600 ml  Net -495.6 ml   Filed Weights   03/28/20 0500 03/29/20 0500 03/31/20 0656  Weight: 93.4 kg 93.4 kg 92 kg    Examination:  General exam: Chronically sick looking female, not in any obvious distress. Occasionally anxious. Respiratory system: Clear to auscultation. Respiratory effort normal.  On room air. Cardiovascular system: S1 & S2 heard, RRR. Gastrointestinal system: Soft and nontender.   Central nervous system: No focal logical deficit. Patient with flat affect.  Intermittently anxious and occasionally agitated.  Delusional.  Extremities: Moves all extremities. Skin: Chronic venous stasis changes on both legs.  No open ulcers.    Data Reviewed: I have personally reviewed following labs and imaging studies  CBC: Recent Labs  Lab 03/25/20 0239  WBC 8.3  NEUTROABS 5.2  HGB 9.5*  HCT  31.5*  MCV 82.9  PLT 833   Basic Metabolic Panel: Recent Labs  Lab 03/25/20 0239 03/26/20 0522 03/27/20 0605 03/30/20 1057  NA 140 140 142 140  K 4.9 4.5 4.7 5.5*  CL 104 103 101 101  CO2 26 27 29 28   GLUCOSE 161* 110* 144* 166*  BUN 30* 27* 24* 40*  CREATININE 1.63* 1.34* 1.22* 1.87*  CALCIUM 8.3* 8.6* 8.8* 8.6*  MG 1.9  --   --  1.5*  PHOS 3.8  --   --   --    GFR: Estimated Creatinine Clearance: 37.5 mL/min (A) (by C-G formula based on SCr of 1.87 mg/dL (H)). Liver Function Tests: Recent Labs  Lab 03/25/20 0239 03/26/20 0522 03/27/20 0605  AST 23 24 27   ALT 19 18 19   ALKPHOS 226* 230* 246*  BILITOT 0.7 0.7 0.7  PROT 7.1 7.2 7.8  ALBUMIN 2.5* 2.5* 2.7*   No results for input(s): LIPASE, AMYLASE in the last 168 hours. No results for input(s): AMMONIA in the last 168 hours. Coagulation Profile: No results for input(s): INR, PROTIME in the last 168 hours. Cardiac Enzymes: No results for input(s): CKTOTAL, CKMB, CKMBINDEX, TROPONINI in the last 168 hours. BNP (last 3 results) Recent Labs  12/19/19 1546 02/09/20 0825  PROBNP 1,148* 1,185*   HbA1C: No results for input(s): HGBA1C in the last 72 hours. CBG: Recent Labs  Lab 03/30/20 0804 03/30/20 1113 03/30/20 1617 03/30/20 2156 03/31/20 0743  GLUCAP 153* 139* 162* 161* 137*   Lipid Profile: No results for input(s): CHOL, HDL, LDLCALC, TRIG, CHOLHDL, LDLDIRECT in the last 72 hours. Thyroid Function Tests: No results for input(s): TSH, T4TOTAL, FREET4, T3FREE, THYROIDAB in the last 72 hours. Anemia Panel: No results for input(s): VITAMINB12, FOLATE, FERRITIN, TIBC, IRON, RETICCTPCT in the last 72 hours. Sepsis Labs: No results for input(s): PROCALCITON, LATICACIDVEN in the last 168 hours.  Recent Results (from the past 240 hour(s))  MRSA PCR Screening     Status: None   Collection Time: 03/22/20  5:26 AM   Specimen: Nasopharyngeal  Result Value Ref Range Status   MRSA by PCR NEGATIVE NEGATIVE  Final    Comment:        The GeneXpert MRSA Assay (FDA approved for NASAL specimens only), is one component of a comprehensive MRSA colonization surveillance program. It is not intended to diagnose MRSA infection nor to guide or monitor treatment for MRSA infections. Performed at [facility], [place] [address] [place], [place] [address]          Radiology Studies: No results found.      Scheduled Meds: . acetaminophen  650 mg Oral Q6H  . amitriptyline  100 mg Oral QHS  . atorvastatin  20 mg Oral Daily  . busPIRone  10 mg Oral TID  . carbamazepine  200 mg Oral BID  . chlorhexidine  15 mL Mouth Rinse BID  . clopidogrel  75 mg Oral Daily  . collagenase   Topical Daily  . docusate sodium  100 mg Oral BID  . furosemide  40 mg Oral Daily  . insulin aspart  0-5 Units Subcutaneous QHS  . insulin aspart  0-6 Units Subcutaneous TID WC  . insulin glargine  10 Units Subcutaneous QHS  . lipase/protease/amylase  72,000 Units Oral TID WC  . mouth rinse  15 mL Mouth Rinse q12n4p  . metoprolol succinate  50 mg Oral Daily  . mometasone-formoterol  2 puff Inhalation BID  . nicotine  14 mg Transdermal Daily  . OLANZapine  5 mg Oral QHS   Continuous Infusions:    LOS: 10 days    Time spent: 30 minutes    Barb Merino, MD [facility] Pager [phone]

## 2020-03-31 NOTE — Progress Notes (Signed)
Pt at this time resting quietly in the bed and agitation has decreased. Maintain all current safety measures and plan of care for the Pt

## 2020-03-31 NOTE — Progress Notes (Signed)
Pt at this time lying in bed agitation has decreased at this time. Maintain all current safety measures and plan of care for the Pt.

## 2020-03-31 NOTE — Progress Notes (Signed)
RN has attempted twice to give Pt's oral Medications Pt refuses to open mouth and became irritated and trying to grab and push staff away. MD has been updated regarding this and Pt became agitated and lab is unable to draw lab that is ordered. Maintain current plan of care for Pt

## 2020-03-31 NOTE — Plan of Care (Signed)
  Problem: Clinical Measurements: Goal: Respiratory complications will improve Outcome: Progressing Goal: Cardiovascular complication will be avoided Outcome: Progressing   Problem: Elimination: Goal: Will not experience complications related to bowel motility Outcome: Progressing   Problem: Pain Managment: Goal: General experience of comfort will improve Outcome: Progressing   Problem: Safety: Goal: Ability to remain free from injury will improve Outcome: Progressing   Problem: Skin Integrity: Goal: Risk for impaired skin integrity will decrease Outcome: Progressing

## 2020-03-31 NOTE — Progress Notes (Signed)
Sitter called out for assistance. Pt sitting on side attempting to stand up. Staff unable to reorient Pt and Pt pushing at staff when attempts made to assist Pt back to lying down in bed. Pt pointing out the door and believes her brother is in the hallway. Pt states "My brother is there " Pointing out the door. Security called to assist Staff in placing Pt back in the bed. MD notified and new orders to be placed.

## 2020-03-31 NOTE — Progress Notes (Signed)
Pt remains very restless agitated and attempting multiple times to get out of bed. Staff is unable to reorient Pt or calm Pt. MD has now come to room and events with Pt's agitation and multiple attempts to get out of bed discussed with the MD. There is no decrease in Pt's agitation noted after IM Haldol. Per new order IV Ativan given 1 mg. Maintain current plan of care and all safety measures for the Pt. Sitter remains at bedside.

## 2020-04-01 LAB — COMPREHENSIVE METABOLIC PANEL
ALT: 28 U/L (ref 0–44)
AST: 48 U/L — ABNORMAL HIGH (ref 15–41)
Albumin: 2.8 g/dL — ABNORMAL LOW (ref 3.5–5.0)
Alkaline Phosphatase: 198 U/L — ABNORMAL HIGH (ref 38–126)
Anion gap: 13 (ref 5–15)
BUN: 22 mg/dL — ABNORMAL HIGH (ref 6–20)
CO2: 26 mmol/L (ref 22–32)
Calcium: 8.3 mg/dL — ABNORMAL LOW (ref 8.9–10.3)
Chloride: 103 mmol/L (ref 98–111)
Creatinine, Ser: 1.11 mg/dL — ABNORMAL HIGH (ref 0.44–1.00)
GFR, Estimated: 58 mL/min — ABNORMAL LOW (ref >60)
Glucose, Bld: 220 mg/dL — ABNORMAL HIGH (ref 70–99)
Potassium: 3.7 mmol/L (ref 3.5–5.1)
Sodium: 142 mmol/L (ref 135–145)
Total Bilirubin: 1 mg/dL (ref 0.3–1.2)
Total Protein: 7.4 g/dL (ref 6.5–8.1)

## 2020-04-01 LAB — GLUCOSE, CAPILLARY
Glucose-Capillary: 141 mg/dL — ABNORMAL HIGH (ref 70–99)
Glucose-Capillary: 152 mg/dL — ABNORMAL HIGH (ref 70–99)
Glucose-Capillary: 194 mg/dL — ABNORMAL HIGH (ref 70–99)
Glucose-Capillary: 202 mg/dL — ABNORMAL HIGH (ref 70–99)

## 2020-04-01 MED ORDER — POLYETHYLENE GLYCOL 3350 17 G PO PACK
17.0000 g | PACK | Freq: Every day | ORAL | Status: DC
  Administered 2020-04-01 – 2020-04-22 (×17): 17 g via ORAL
  Filled 2020-04-01 (×18): qty 1

## 2020-04-01 MED ORDER — CARBAMAZEPINE 200 MG PO TABS
300.0000 mg | ORAL_TABLET | Freq: Two times a day (BID) | ORAL | Status: DC
  Administered 2020-04-01 – 2020-04-06 (×9): 300 mg via ORAL
  Filled 2020-04-01 (×11): qty 1.5

## 2020-04-01 MED ORDER — BISACODYL 10 MG RE SUPP
10.0000 mg | Freq: Once | RECTAL | Status: DC
  Filled 2020-04-01: qty 1

## 2020-04-01 NOTE — Progress Notes (Signed)
Physical Therapy Treatment Patient Details Name: Morgan Beasley MRN: [id number] DOB: [birth year] Today's Date: 04/01/2020    History of Present Illness Pt admitted from home 03/21/20  and dx with acute respiratory failure 2* pleural effusions and pulmonary edema as well as acute metabolic encephalopathy and cerebral embolism with cerebral infarction.  Pt with hx of PAD, Lymphadema, DM, diabetic neuropathy, COPD, CKD, polysubstance abuse, bipolar, and fem-pop bypass graft.    PT Comments    Pt assisted with ambulating in hallway however distance limited due to pt reporting fatigue and weakness.  Pt requiring min assist for stability and multimodal cues for technique/participation.   Follow Up Recommendations  SNF (vs geri psych?)     Equipment Recommendations  None recommended by PT    Recommendations for Other Services       Precautions / Restrictions Precautions Precautions: Fall Precaution Comments: L foot wound, currently on 2L O2 [place]    Mobility  Bed Mobility Overal bed mobility: Needs Assistance Bed Mobility: Supine to Sit     Supine to sit: Min assist     General bed mobility comments: Multimodal cues to gain pt participation and physical assist to bring trunk to upright  Transfers Overall transfer level: Needs assistance Equipment used: Rolling walker (2 wheeled) Transfers: Sit to/from Stand Sit to Stand: Min assist;+2 safety/equipment         General transfer comment: Cues for safety, hand placement. Assist to rise, stabilize, control descent.  pt attempting to descend before fully reaching chair  Ambulation/Gait Ambulation/Gait assistance: Min assist;+2 safety/equipment Gait Distance (Feet): 40 Feet Assistive device: Rolling walker (2 wheeled) Gait Pattern/deviations: Step-through pattern;Decreased stride length;Drifts right/left     General Gait Details: assist for steadying and RW negotiation, multimodal cues for safety; pt reports fatigue so  distance limited; pt remained on 2L O2 [place]   Stairs             Wheelchair Mobility    Modified Rankin (Stroke Patients Only)       Balance                                            Cognition Arousal/Alertness: Awake/alert Behavior During Therapy: WFL for tasks assessed/performed Overall Cognitive Status: Impaired/Different from baseline Area of Impairment: Orientation;Attention;Following commands;Safety/judgement;Awareness;Problem solving                 Orientation Level: Disoriented to;Time;Situation Current Attention Level: Focused Memory: Decreased recall of precautions;Decreased short-term memory Following Commands: Follows one step commands with increased time Safety/Judgement: Decreased awareness of safety;Decreased awareness of deficits   Problem Solving: Decreased initiation;Requires verbal cues;Requires tactile cues        Exercises      General Comments        Pertinent Vitals/Pain Pain Assessment: No/denies pain    Home Living                      Prior Function            PT Goals (current goals can now be found in the care plan section) Progress towards PT goals: Progressing toward goals    Frequency    Min 2X/week      PT Plan Current plan remains appropriate    Co-evaluation              AM-PAC PT "6 Clicks" Mobility  Outcome Measure  Help needed turning from your back to your side while in a flat bed without using bedrails?: A Little Help needed moving from lying on your back to sitting on the side of a flat bed without using bedrails?: A Little Help needed moving to and from a bed to a chair (including a wheelchair)?: A Little Help needed standing up from a chair using your arms (e.g., wheelchair or bedside chair)?: A Little Help needed to walk in hospital room?: A Little Help needed climbing 3-5 steps with a railing? : A Lot 6 Click Score: 17    End of Session Equipment  Utilized During Treatment: Gait belt Activity Tolerance: Patient tolerated treatment well Patient left: in chair;with call bell/phone within reach;with nursing/sitter in room Nurse Communication: Mobility status PT Visit Diagnosis: Muscle weakness (generalized) (M62.81);Unsteadiness on feet (R26.81)     Time: 7902-4097 PT Time Calculation (min) (ACUTE ONLY): 20 min  Charges:  $Gait Training: 8-22 mins                     Arlyce Dice, DPT Acute Rehabilitation Services Pager: [phone] Office: [phone]  York Ram E 04/01/2020, 1:09 PM

## 2020-04-01 NOTE — Progress Notes (Signed)
PROGRESS NOTE    Morgan Beasley  MCN:[id number] DOB: 1961/02/12 DOA: 03/21/2020 PCP: Riki Sheer, NP    Brief Narrative:  59 year old female with recent hospitalization for line associated pulmonic valve endocarditis and Serratia bacteremia, multiple territory stroke who was discharged home after complicated hospitalization for agitation and delirium on 11/18 on cefepime through PICC line presented back on 11/21 with severe confusion, hallucinations and restlessness.  She was found to have pleural effusions, pulmonary edema.  Waxing and waning agitation delirium requiring Precedex in ICU. 11/23, Precedex off.  On multiple antipsychotic medications.  Remains agitated intermittently.  Patient has history of depression, bipolar disorder, cocaine use and alcoholism.   Hospital course complicated with ongoing intermittent agitation and underlying behavioral issues. 11/29, mental status slightly improved.  More mobility.  Awake but delusional. Seen by psychiatry and they renewed IVC. Recommended inpatient psych admissions at the [facility] unit. Patient is intermittently on oxygen.  She does not use CPAP or BiPAP. Her behavior is somehow improving, still remains delusional.   Assessment & Plan:   Principal Problem:   Acute metabolic encephalopathy Active Problems:   Cerebral embolism with cerebral infarction   Anasarca  Agitation, delirium and metabolic encephalopathy in a patient with known drug and alcohol use disorder and bipolar disorder.   Gradual improvement of symptoms.  Appreciate psychiatric input.   Fall precautions.  Delirium precautions.  Air cabin crew.   BuSpar, now on 10 mg TID Tegretol 200 mg BID Nighttime Zyprexa, 5 mg at night.  Repeat QTC was 490.  We will recheck EKG frequently. Nighttime amitriptyline Occasionally using intermittent Ativan. Continue supportive treatment, day and night cycle regulation.  Continue safety sitter and soft restraints as  needed to protect from fall and injury. Clinically stabilizing. Due to IVC, needs to be seen by psychiatry every 3 days to reevaluate.  I have placed consult.  Acute hypoxemic respiratory failure secondary to multifactorial pulmonary edema and pleural effusions, acute CHF on chronic obstructive sleep apnea, underlying history of COPD: On oral Lasix.  Improved.  Will continue on Lasix. Known ejection fraction of 40%. On metoprolol.  Mostly remains on room air. Renal function stable.  On oral Lasix.  Euvolemic.  History of coronary artery disease: She remains on Plavix, beta-blockers.  Currently no evidence of acute coronary syndrome.  Recent evolving right and chronic left occipital infarcts: MRI on 11/23 with remote left thalamic and basal ganglia microhemorrhages and chronic microvascular ischemic changes.  No evidence of new stroke.  Remains on Plavix and statin.  Chronic pancreatitis: On Creon.  Electrolyte abnormalities: Replaced and adequate.  Type 2 diabetes: Controlled.  Remains on insulin in the hospital.  On oral hypoglycemics at home.  Blood sugars are fairly controlled on Lantus and sliding scale.  Acute kidney injury on chronic kidney disease stage IIIa: Renal function fluctuates.  Need close monitoring on dialysis.  Stabilizing.  At about baseline.  Anemia of chronic disease: Baseline hemoglobin about 8-9.  No evidence of bleeding.  Right supraclavicular and mediastinal adenopathy:  Patient has history of breast cancer treated with mastectomy.   Similar lymphadenopathy present on 01/2019 and does not look like progressed in last 1 year.   Will need outpatient follow-up once patient is more stabilized.   Continue to mobilize with PT OT.  Change to oral medications in anticipation of discharge. Refer to [facility]. Transfer to [facility] when bed is available.  Continue IVC.   DVT prophylaxis: SCDs Start: 03/21/20 1629   Code Status:  Full code. Family Communication:  Daughter at the bedside.  Multiple family members on the [place]. Disposition Plan: Status is: Inpatient  Remains inpatient appropriate because:Unsafe d/c plan and Inpatient level of care appropriate due to severity of illness   Dispo: The patient is from: Home              Anticipated d/c is to: [facility] psych when possible.              Anticipated d/c date is: When bed available.              Patient currently is medically stable only to transfer to psychiatric hospital.  Consultants:   PCCM  ID  Psychiatry  Procedures:   None  Antimicrobials:   None   Subjective: Patient seen and examined.  She had one episode of restlessness and was trying to get out of yesterday evening, symptoms improved with 1 dose of Ativan. Today, she is more awake and able to have some conversation.  Daughter was at the bedside.  She is a still not oriented to the situation.  Objective: Vitals:   03/31/20 1941 03/31/20 2146 04/01/20 0605 04/01/20 0832  BP:  (!) 167/90 (!) 150/95   Pulse:  (!) 102 (!) 107   Resp:  19 18   Temp:  98.9 F (37.2 C) 98.2 F (36.8 C)   TempSrc:      SpO2: 92% 100% 100% 96%  Weight:      Height:        Intake/Output Summary (Last 24 hours) at 04/01/2020 1259 Last data filed at 04/01/2020 1148 Gross per 24 hour  Intake 290 ml  Output 1800 ml  Net -1510 ml   Filed Weights   03/28/20 0500 03/29/20 0500 03/31/20 0656  Weight: 93.4 kg 93.4 kg 92 kg    Examination:  General exam: Chronically sick looking female, not in any obvious distress. Anxious.  Occasionally delirious.  Today more composed and quiet. Respiratory system: Clear to auscultation. Respiratory effort normal.  On room air. Cardiovascular system: S1 & S2 heard, RRR. Gastrointestinal system: Soft and nontender.   Central nervous system: No focal logical deficit. Patient mostly anxious.  Has flat affect.  She talks incomprehensible sentences. Extremities: Moves all extremities. Skin: Chronic  venous stasis changes on both legs.  No open ulcers.    Data Reviewed: I have personally reviewed following labs and imaging studies  CBC: No results for input(s): WBC, NEUTROABS, HGB, HCT, MCV, PLT in the last 168 hours. Basic Metabolic Panel: Recent Labs  Lab 03/26/20 0522 03/27/20 0605 03/30/20 1057 03/31/20 1406  NA 140 142 140 143  K 4.5 4.7 5.5* 4.4  CL 103 101 101 102  CO2 27 29 28 27   GLUCOSE 110* 144* 166* 155*  BUN 27* 24* 40* 43*  CREATININE 1.34* 1.22* 1.87* 1.57*  CALCIUM 8.6* 8.8* 8.6* 8.7*  MG  --   --  1.5*  --    GFR: Estimated Creatinine Clearance: 44.6 mL/min (A) (by C-G formula based on SCr of 1.57 mg/dL (H)). Liver Function Tests: Recent Labs  Lab 03/26/20 0522 03/27/20 0605  AST 24 27  ALT 18 19  ALKPHOS 230* 246*  BILITOT 0.7 0.7  PROT 7.2 7.8  ALBUMIN 2.5* 2.7*   No results for input(s): LIPASE, AMYLASE in the last 168 hours. No results for input(s): AMMONIA in the last 168 hours. Coagulation Profile: No results for input(s): INR, PROTIME in the last 168 hours. Cardiac Enzymes: No results  for input(s): CKTOTAL, CKMB, CKMBINDEX, TROPONINI in the last 168 hours. BNP (last 3 results) Recent Labs    12/19/19 1546 02/09/20 0825  PROBNP 1,148* 1,185*   HbA1C: No results for input(s): HGBA1C in the last 72 hours. CBG: Recent Labs  Lab 03/31/20 1205 03/31/20 1618 03/31/20 2142 04/01/20 0736 04/01/20 1123  GLUCAP 153* 160* 132* 141* 152*   Lipid Profile: No results for input(s): CHOL, HDL, LDLCALC, TRIG, CHOLHDL, LDLDIRECT in the last 72 hours. Thyroid Function Tests: No results for input(s): TSH, T4TOTAL, FREET4, T3FREE, THYROIDAB in the last 72 hours. Anemia Panel: No results for input(s): VITAMINB12, FOLATE, FERRITIN, TIBC, IRON, RETICCTPCT in the last 72 hours. Sepsis Labs: No results for input(s): PROCALCITON, LATICACIDVEN in the last 168 hours.  No results found for this or any previous visit (from the past 240 hour(s)).        Radiology Studies: No results found.      Scheduled Meds: . acetaminophen  650 mg Oral Q6H  . amitriptyline  100 mg Oral QHS  . atorvastatin  20 mg Oral Daily  . busPIRone  10 mg Oral TID  . carbamazepine  200 mg Oral BID  . chlorhexidine  15 mL Mouth Rinse BID  . clopidogrel  75 mg Oral Daily  . collagenase   Topical Daily  . docusate sodium  100 mg Oral BID  . furosemide  40 mg Oral Daily  . insulin aspart  0-5 Units Subcutaneous QHS  . insulin aspart  0-6 Units Subcutaneous TID WC  . insulin glargine  10 Units Subcutaneous QHS  . lipase/protease/amylase  72,000 Units Oral TID WC  . mouth rinse  15 mL Mouth Rinse q12n4p  . metoprolol succinate  50 mg Oral Daily  . mometasone-formoterol  2 puff Inhalation BID  . nicotine  14 mg Transdermal Daily  . OLANZapine  5 mg Oral QHS   Continuous Infusions:    LOS: 11 days    Time spent: 30 minutes    Barb Merino, MD [facility] Pager [phone]

## 2020-04-01 NOTE — Consult Note (Signed)
[facility] Consult   Reason for Consult: Consult every 3 days, delusion, psychosis, IVC Referring Physician: Dr. Sloan Leiter Patient Identification: Morgan Beasley MRN:  [id number] Principal Diagnosis: Acute metabolic encephalopathy Diagnosis:  Principal Problem:   Acute metabolic encephalopathy Active Problems:   Cerebral embolism with cerebral infarction   Anasarca   Total Time spent with patient: 30 minutes  Subjective:   Morgan Beasley is a 59 y.o. female patient admitted with acute metabolic encephalopathy.  On evaluation patient is alert and oriented to self only, calm and awake. She appears to be improved since the previous evaluation on Monday, November 29.  Patient mood has also improved, she is more talkative although she is delusional and nonsensical speech at times.  She does not show any signs of aggression, agitation, and or mood lability.  She did make several attempts to get out of the bed stating she had to go to the bathroom.  She was easily redirected by her Air cabin crew.  She continues to have delusional thinking.  She also denies suicidal ideation, homicidal ideation, and or auditory visual hallucinations.   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  She was found to have pleural effusions, pulmonary edema.  Waxing and waning agitation delirium requiring Precedex in ICU. 11/23, Precedex off.  On multiple antipsychotic medications.  Remains agitated intermittently.  Patient has history of depression, bipolar disorder, cocaine use and alcoholism.  She was even found with alcohol with her when she was discharged from hospital with complicated  hospitalization. Hospital course is complicated with ongoing intermittent agitation and underlying behavioral issues.  Past Psychiatric History: Anxiety and depression.  Currently taking amitriptyline being prescribed by her primary care provider.  She denies previous psychiatric diagnosis.  She denies previous inpatient admission.  She does not appear to be open to psychiatric services at this time.   Risk to Self:   Risk to Others:   Prior Inpatient Therapy:   Prior Outpatient Therapy:    Past Medical History:  Past Medical History:  Diagnosis Date  . Alcohol dependence ([place])   . Anemia   . Anxiety   . Breast cancer ([place])   . Chronic combined systolic and diastolic CHF (congestive heart failure) ([place])   . Cigarette nicotine dependence   . CKD (chronic kidney disease), stage III ([place])   . Colon polyps   . COPD (chronic obstructive pulmonary disease) ([place])   . Diabetes mellitus without complication ([place])   . Diabetic neuropathy ([place])   . Gout   . Hyperlipemia   . Hypertension   . Insomnia   . Lymphedema   . Marijuana use   . Mild CAD 2016   a. NSTEMI 2016 in context of cocaine abuse, 50% RCA% at that time.  . OSA treated with BiPAP   . PAD (peripheral artery disease) ([place])    a. s/p L SFA stenting 08/2019. b. left fem-to-below-knee-popliteal bypass 09/2019.  Marland Kitchen Pancreatitis    acute on chronic due to ETOH initially.    . Sleep apnea    wears BIPAP  . Ulcer of foot ([place])    right    Past Surgical History:  Procedure Laterality Date  . ABDOMINAL AORTOGRAM W/LOWER EXTREMITY N/A 09/26/2019   Procedure: ABDOMINAL AORTOGRAM W/LOWER EXTREMITY;  Surgeon: Angelia Mould, MD;  [facility];  Service: Cardiovascular;  Laterality: N/A;  . ABDOMINAL AORTOGRAM W/LOWER EXTREMITY Bilateral 12/08/2019   Procedure: ABDOMINAL AORTOGRAM W/LOWER EXTREMITY;  Surgeon: Waynetta Sandy, MD;  Location: [facility];  Service: Cardiovascular;  Laterality:  Bilateral;  . ABDOMINAL AORTOGRAM W/LOWER EXTREMITY Left 01/26/2020   Procedure: ABDOMINAL AORTOGRAM W/LOWER EXTREMITY;  Surgeon: Waynetta Sandy, MD;  Location: [facility];  Service: Cardiovascular;  Laterality: Left;  . ABDOMINAL HYSTERECTOMY    . BUBBLE STUDY  03/04/2020   Procedure: BUBBLE STUDY;  Surgeon: Elouise Munroe, MD;  Location: [facility]OSCOPY;  Service: Cardiology;;  . [facility]  . FEMORAL-POPLITEAL BYPASS GRAFT Left 10/14/2019   Procedure: BYPASS GRAFT LEFT FEMORAL-POPLITEAL ARTERY USING NONREVERSED SAPHENOUS VEIN;  Surgeon: Waynetta Sandy, MD;  Location: [facility];  Service: Vascular;  Laterality: Left;  Marland Kitchen MASTECTOMY Right April 2016  . PERIPHERAL VASCULAR ATHERECTOMY Left 01/26/2020   Procedure: PERIPHERAL VASCULAR ATHERECTOMY;  Surgeon: Waynetta Sandy, MD;  Location: [facility];  Service: Cardiovascular;  Laterality: Left;  PT and AT - [facility]  . PERIPHERAL VASCULAR BALLOON ANGIOPLASTY Left 01/26/2020   Procedure: PERIPHERAL VASCULAR BALLOON ANGIOPLASTY;  Surgeon: Waynetta Sandy, MD;  Location: [facility];  Service: Cardiovascular;  Laterality: Left;  TP Trunk   . PERIPHERAL VASCULAR INTERVENTION Left 09/26/2019   Procedure: PERIPHERAL VASCULAR INTERVENTION;  Surgeon: Angelia Mould, MD;  Location: [facility];  Service: Cardiovascular;  Laterality: Left;  superficial femoral  . TEE WITHOUT CARDIOVERSION N/A 03/04/2020   Procedure: TRANSESOPHAGEAL ECHOCARDIOGRAM (TEE);  Surgeon: Elouise Munroe, MD;  Location: [facility];  Service: Cardiology;  Laterality: N/A;   Family History:  Family History  Problem Relation Age of Onset  . Diabetes Other   . Heart disease Other   . Breast cancer Maternal Grandmother   . Breast cancer Paternal Grandmother   . Stroke Son   . Colon cancer Neg Hx   . Esophageal cancer Neg Hx   . Rectal cancer Neg Hx   . Stomach cancer Neg Hx    Family  Psychiatric  History:  Social History:  Social History   Substance and Sexual Activity  Alcohol Use Not Currently   Comment: Beer - "on the weekends hanging out, maybe 2 or 3"      Social History   Substance and Sexual Activity  Drug Use Not Currently  . Types: Marijuana   Comment: last used 8 2020    Social History   Socioeconomic History  . Marital status: Single    Spouse name: Not on file  . Number of children: Not on file  . Years of education: Not on file  . Highest education level: Not on file  Occupational History  . Not on file  Tobacco Use  . Smoking status: Light Tobacco Smoker    Types: Cigarettes  . Smokeless tobacco: Never Used  . Tobacco comment: 3 cigarettes a day  Vaping Use  . Vaping Use: Never used  Substance and Sexual Activity  . Alcohol use: Not Currently    Comment: Beer - "on the weekends hanging out, maybe 2 or 3"   . Drug use: Not Currently    Types: Marijuana    Comment: last used 8 2020  . Sexual activity: Not on file  Other Topics Concern  . Not on file  Social History Narrative  . Not  on file   Social Determinants of Health   Financial Resource Strain:   . Difficulty of Paying Living Expenses: Not on file  Food Insecurity:   . Worried About Charity fundraiser in the Last Year: Not on file  . Ran Out of Food in the Last Year: Not on file  Transportation Needs:   . Lack of Transportation (Medical): Not on file  . Lack of Transportation (Non-Medical): Not on file  Physical Activity:   . Days of Exercise per Week: Not on file  . Minutes of Exercise per Session: Not on file  Stress:   . Feeling of Stress : Not on file  Social Connections:   . Frequency of Communication with Friends and Family: Not on file  . Frequency of Social Gatherings with Friends and Family: Not on file  . Attends Religious Services: Not on file  . Active Member of Clubs or Organizations: Not on file  . Attends Archivist Meetings: Not on file   . Marital Status: Not on file   Additional Social History:    Allergies:   Allergies  Allergen Reactions  . Tramadol Swelling  . Nsaids Other (See Comments)    Pancreatitis  . Tolmetin Other (See Comments)    Pancreatitis  . Tylenol [Acetaminophen] Other (See Comments)    unknown  . Aspirin Other (See Comments)    "Makes my pancreas act up"     Labs:  Results for orders placed or performed during the hospital encounter of 03/21/20 (from the past 48 hour(s))  Glucose, capillary     Status: Abnormal   Collection Time: 03/30/20  4:17 PM  Result Value Ref Range   Glucose-Capillary 162 (H) 70 - 99 mg/dL    Comment: Glucose reference range applies only to samples taken after fasting for at least 8 hours.  Glucose, capillary     Status: Abnormal   Collection Time: 03/30/20  9:56 PM  Result Value Ref Range   Glucose-Capillary 161 (H) 70 - 99 mg/dL    Comment: Glucose reference range applies only to samples taken after fasting for at least 8 hours.   Comment 1 Notify RN    Comment 2 Document in Chart   Glucose, capillary     Status: Abnormal   Collection Time: 03/31/20  7:43 AM  Result Value Ref Range   Glucose-Capillary 137 (H) 70 - 99 mg/dL    Comment: Glucose reference range applies only to samples taken after fasting for at least 8 hours.  Glucose, capillary     Status: Abnormal   Collection Time: 03/31/20 12:05 PM  Result Value Ref Range   Glucose-Capillary 153 (H) 70 - 99 mg/dL    Comment: Glucose reference range applies only to samples taken after fasting for at least 8 hours.  Basic metabolic panel     Status: Abnormal   Collection Time: 03/31/20  2:06 PM  Result Value Ref Range   Sodium 143 135 - 145 mmol/L   Potassium 4.4 3.5 - 5.1 mmol/L    Comment: DELTA CHECK NOTED   Chloride 102 98 - 111 mmol/L   CO2 27 22 - 32 mmol/L   Glucose, Bld 155 (H) 70 - 99 mg/dL    Comment: Glucose reference range applies only to samples taken after fasting for at least 8 hours.    BUN 43 (H) 6 - 20 mg/dL   Creatinine, Ser 1.57 (H) 0.44 - 1.00 mg/dL   Calcium 8.7 (L) 8.9 - 10.3  mg/dL   GFR, Estimated 38 (L) >60 mL/min    Comment: (NOTE) Calculated using the CKD-EPI Creatinine Equation (2021)    Anion gap 14 5 - 15    Comment: Performed at [facility], [place] [address], [place], [place] [address]  Glucose, capillary     Status: Abnormal   Collection Time: 03/31/20  4:18 PM  Result Value Ref Range   Glucose-Capillary 160 (H) 70 - 99 mg/dL    Comment: Glucose reference range applies only to samples taken after fasting for at least 8 hours.  Glucose, capillary     Status: Abnormal   Collection Time: 03/31/20  9:42 PM  Result Value Ref Range   Glucose-Capillary 132 (H) 70 - 99 mg/dL    Comment: Glucose reference range applies only to samples taken after fasting for at least 8 hours.  Glucose, capillary     Status: Abnormal   Collection Time: 04/01/20  7:36 AM  Result Value Ref Range   Glucose-Capillary 141 (H) 70 - 99 mg/dL    Comment: Glucose reference range applies only to samples taken after fasting for at least 8 hours.  Glucose, capillary     Status: Abnormal   Collection Time: 04/01/20 11:23 AM  Result Value Ref Range   Glucose-Capillary 152 (H) 70 - 99 mg/dL    Comment: Glucose reference range applies only to samples taken after fasting for at least 8 hours.    Current Facility-Administered Medications  Medication Dose Route Frequency Provider Last Rate Last Admin  . acetaminophen (TYLENOL) tablet 650 mg  650 mg Oral Q6H Barb Merino, MD   650 mg at 04/01/20 1124  . albuterol (VENTOLIN HFA) 108 (90 Base) MCG/ACT inhaler 2 puff  2 puff Inhalation Q6H PRN Marylyn Ishihara, Tyrone A, DO      . amitriptyline (ELAVIL) tablet 100 mg  100 mg Oral QHS Kyle, Tyrone A, DO   100 mg at 03/31/20 2202  . atorvastatin (LIPITOR) tablet 20 mg  20 mg Oral Daily Kyle, Tyrone A, DO   20 mg at 04/01/20 1044  . busPIRone (BUSPAR) tablet 10 mg  10 mg Oral TID  Suella Broad, FNP   10 mg at 04/01/20 1044  . carbamazepine (TEGRETOL) chewable tablet 200 mg  200 mg Oral BID Suella Broad, FNP   200 mg at 03/31/20 2201  . chlorhexidine (PERIDEX) 0.12 % solution 15 mL  15 mL Mouth Rinse BID Marylyn Ishihara, Tyrone A, DO   15 mL at 03/31/20 2201  . clopidogrel (PLAVIX) tablet 75 mg  75 mg Oral Daily Kyle, Tyrone A, DO   75 mg at 04/01/20 1044  . collagenase (SANTYL) ointment   Topical Daily Patrecia Pour, MD   Given at 04/01/20 1044  . docusate sodium (COLACE) capsule 100 mg  100 mg Oral BID Cristal Generous, NP   100 mg at 03/31/20 2202  . furosemide (LASIX) tablet 40 mg  40 mg Oral Daily Barb Merino, MD   40 mg at 04/01/20 1044  . haloperidol (HALDOL) tablet 5 mg  5 mg Oral Q6H PRN Barb Merino, MD      . insulin aspart (novoLOG) injection 0-5 Units  0-5 Units Subcutaneous QHS Patrecia Pour, MD   2 Units at 03/24/20 2116  . insulin aspart (novoLOG) injection 0-6 Units  0-6 Units Subcutaneous TID WC Patrecia Pour, MD   1 Units at 04/01/20 1134  . insulin glargine (LANTUS) injection 10 Units  10 Units Subcutaneous QHS  Patrecia Pour, MD   10 Units at 03/31/20 2201  . lipase/protease/amylase (CREON) capsule 36,000 Units  36,000 Units Oral TID PRN Minda Ditto, RPH   36,000 Units at 03/30/20 1038  . lipase/protease/amylase (CREON) capsule 72,000 Units  72,000 Units Oral TID WC Minda Ditto, RPH   72,000 Units at 04/01/20 1124  . LORazepam (ATIVAN) tablet 1 mg  1 mg Oral QHS PRN Barb Merino, MD   1 mg at 03/31/20 2203  . MEDLINE mouth rinse  15 mL Mouth Rinse q12n4p Kyle, Tyrone A, DO   15 mL at 03/30/20 1627  . metoprolol succinate (TOPROL-XL) 24 hr tablet 50 mg  50 mg Oral Daily Kyle, Tyrone A, DO   50 mg at 04/01/20 1044  . mometasone-formoterol (DULERA) 200-5 MCG/ACT inhaler 2 puff  2 puff Inhalation BID Marylyn Ishihara, Tyrone A, DO   2 puff at 04/01/20 4287  . nicotine (NICODERM CQ - dosed in mg/24 hours) patch 14 mg  14 mg Transdermal Daily Barb Merino, MD   14 mg at 04/01/20 1046  . OLANZapine (ZYPREXA) tablet 5 mg  5 mg Oral QHS Barb Merino, MD   5 mg at 03/31/20 2203  . simethicone (MYLICON) chewable tablet 80 mg  80 mg Oral QID PRN Cristal Generous, NP   80 mg at 03/23/20 1634  . sodium chloride flush (NS) 0.9 % injection 10-40 mL  10-40 mL Intracatheter PRN Deno Etienne, DO        Musculoskeletal: Strength & Muscle Tone: within normal limits Gait & Station: normal Patient leans: N/A  Psychiatric Specialty Exam: Physical Exam   Review of Systems   Blood pressure (!) 148/89, pulse 100, temperature 97.7 F (36.5 C), temperature source Oral, resp. rate 16, height 5\' 6"  (1.676 m), weight 92 kg, SpO2 100 %.Body mass index is 32.74 kg/m.  General Appearance: Casual  Eye Contact:  Fair  Speech:  Clear and Coherent and Normal Rate  Volume:  Normal  Mood:  Anxious  Affect:  Constricted  Thought Process:  Irrelevant and Descriptions of Associations: Intact  Orientation:  Other:  Alert and oriented to self only  Thought Content:  Illogical, Delusions and Rumination  Suicidal Thoughts:  No  Homicidal Thoughts:  No  Memory:  Immediate;   Poor Recent;   Poor Remote;   Poor  Judgement:  Poor  Insight:  Shallow  Psychomotor Activity:  Restlessness  Concentration:  Concentration: Poor and Attention Span: Poor  Recall:  Poor  Fund of Knowledge:  Poor  Language:  Fair  Akathisia:  No  Handed:  Right  AIMS (if indicated):     Assets:  Communication Skills Desire for Improvement Financial Resources/Insurance Housing Leisure Time Social Support  ADL's:  Impaired  Cognition:  Impaired,  Mild  Sleep:      Morgan Beasley is a 59 year old female with significant medical history to include cerebral edema, bipolar, polysubstance abuse, diabetes, cardiovascular disease who presents with altered mental status.  Patient is seen and observed to be lying in bed, confused, irritable, restless, and labile.  Patient is easily  redirected, and frequent reorientation.  Psych consult was placed for delusions, psych consult every 3 days, and psychosis. Her current condition is improving at this time.  She is currently taking amitriptyline 100 mg p.o. nightly, buspirone 10 mg p.o. twice daily, Tegretol 200 mg p.o. twice daily, olanzapine 5 mg p.o. nightly.  Patient denies any suicidality, homicidality, and or auditory visual hallucinations.  Her current  mentation does require ongoing safety concerns, and she continues to meet criteria for IVC.   Treatment Plan Summary: Plan Will continue her current medications with the exception of adjusting her buspirone to 10 mg p.o. 3 times daily.  She is currently taking Tegretol 200 mg p.o. twice daily, will obtain therapeutic level and adjust medication if possible.  Due to patient's current mentation, ongoing needs for skilled services will recommend SNF or assisted living facility.   -Will increase Tegretol 300mg  po BID to further target aggression, agitation, and disorientation. .-Please note that some agitation is normal, and patient behaviors to include agitation, aggression, and irritability are to be expected considering her encephalopathy, cerebral embolism with cerebral infarction, polysubstance abuse, and underlying mood disorder.  Her symptoms have improved since her admission although we have not obtain complete cessation of her neurobehavioral symptoms.  Will recommend we continue delirium precautions, safety sitter, and limit use of restraints as they can worsen agitation.  Also recommend getting patient up from bed to chair when she is alert and oriented to do so.  -Recommend working closely with social work for placement to Geriatric facility.  Disposition: Recommend geriatric psych facility, once patient is medically cleared and stable.  Recommend working closely with social work to facilitate inpatient admission due to ongoing neural behavioral symptoms, disorientation,  aggression, and poor mentation  Suella Broad, FNP 04/01/2020 3:48 PM

## 2020-04-01 NOTE — TOC Progression Note (Signed)
Transition of Care [facility]) - Progression Note    Patient Details  Name: Nelly Scriven MRN: [id number] Date of Birth: [birth year]  Transition of Care [facility]) CM/SW Contact  Youssouf Shipley, [name], RN Phone Number: 04/01/2020, 1:25 PM  Clinical Narrative: After no acceptance for Geri psych from [facility]-will fax to [facility] for [facility].      Expected Discharge Plan: Psychiatric Hospital Barriers to Discharge: Continued Medical Work up  Expected Discharge Plan and Services Expected Discharge Plan: [facility]   Discharge Planning Services: CM Consult Post Acute Care Choice: [place] Living arrangements for the past 2 months: [place] Expected Discharge Date:  (unknown)                                     Social Determinants of Health (SDOH) Interventions    Readmission Risk Interventions Readmission Risk Prevention Plan 03/18/2020 10/21/2019  Transportation Screening Complete Complete  Medication Review Press photographer) - Complete  PCP or Specialist appointment within 3-5 days of discharge Not Complete Complete  PCP/Specialist Appt Not Complete comments First available appt 12/1 -  [place] or Home Care Consult Complete Complete  SW Recovery Care/Counseling Consult Complete Complete  Palliative Care Screening Not Applicable Not [place] Not Applicable Not Applicable  Some recent data might be hidden

## 2020-04-01 NOTE — Care Management Important Message (Signed)
Important Message  Patient Details IM Letter given to the Patient. Name: Morgan Beasley MRN: [id number] Date of Birth: 06-20-1960   Medicare Important Message Given:  Yes     Kerin Salen 04/01/2020, 1:09 PM

## 2020-04-01 NOTE — Plan of Care (Signed)
  Problem: Clinical Measurements: Goal: Respiratory complications will improve Outcome: Progressing   Problem: Elimination: Goal: Will not experience complications related to bowel motility Outcome: Progressing   Problem: Pain Managment: Goal: General experience of comfort will improve Outcome: Progressing   Problem: Safety: Goal: Ability to remain free from injury will improve Outcome: Progressing   Problem: Skin Integrity: Goal: Risk for impaired skin integrity will decrease Outcome: Progressing

## 2020-04-01 NOTE — Progress Notes (Signed)
Occupational Therapy Treatment Patient Details Name: Morgan Beasley MRN: [id number] DOB: 12-09-1960 Today's Date: 04/01/2020    History of present illness Pt admitted from home 03/21/20  and dx with acute respiratory failure 2* pleural effusions and pulmonary edema as well as acute metabolic encephalopathy and cerebral embolism with cerebral infarction.  Pt with hx of PAD, Lymphadema, DM, diabetic neuropathy, COPD, CKD, polysubstance abuse, bipolar, and fem-pop bypass graft.   OT comments  Patient initially cooperative and able to transfer to side of bed and stand with min assist. Patient required encouragement to walk to sink and partially washed her hands. Patient became distracted by wanting to find her family in the hall. Patient needed multimodal cues to return to side of bed. Again patient eventually transferred to recliner and able to eat a snack with set up. Per sitter in room patient ambulated to bathroom earlier and assisted with toileting task. Patient continues to be limited by altered mental status and agitation/restlessness. Cont POC   Follow Up Recommendations  SNF    Equipment Recommendations  None recommended by OT    Recommendations for Other Services      Precautions / Restrictions Precautions Precautions: Fall Precaution Comments: L foot wound, currently on 2L O2 [place] Restrictions Weight Bearing Restrictions: No       Mobility Bed Mobility Overal bed mobility: Needs Assistance Bed Mobility: Supine to Sit Rolling: Min assist         General bed mobility comments: Hand hold and use of bed rails to transfer into sitting with increased time.  Transfers   Equipment used: Rolling walker (2 wheeled) Transfers: Sit to/from Stand Sit to Stand: Min assist;From elevated surface Stand pivot transfers: Min guard       General transfer comment: Min assist to stand from bed height with RW. Min guard to ambulate in room and stand at sink.    Balance Overall  balance assessment: Needs assistance Sitting-balance support: No upper extremity supported;Feet supported Sitting balance-Leahy Scale: Good     Standing balance support: Bilateral upper extremity supported Standing balance-Leahy Scale: Poor                             ADL either performed or assessed with clinical judgement   ADL       Grooming: Min guard;Standing Grooming Details (indicate cue type and reason): grossly washed one hand standing at sink. Patient able to turn water off. Limited by patient's cognition. Patient wanting to find her family in the hall.                                     Vision   Vision Assessment?: No apparent visual deficits   Perception     Praxis      Cognition Arousal/Alertness: Awake/alert Behavior During Therapy: Restless;Impulsive Overall Cognitive Status: Impaired/Different from baseline Area of Impairment: Orientation;Attention;Following commands;Safety/judgement;Awareness;Problem solving                 Orientation Level: Disoriented to;Time;Situation Current Attention Level: Focused Memory: Decreased recall of precautions;Decreased short-term memory Following Commands: Follows one step commands inconsistently Safety/Judgement: Decreased awareness of safety;Decreased awareness of deficits Awareness: Intellectual Problem Solving: Decreased initiation;Requires verbal cues;Requires tactile cues          Exercises     Shoulder Instructions       General Comments  Pertinent Vitals/ Pain       Pain Assessment: Faces Faces Pain Scale: No hurt  Home Living                                          Prior Functioning/Environment              Frequency  Min 2X/week        Progress Toward Goals  OT Goals(current goals can now be found in the care plan section)  Progress towards OT goals: Progressing toward goals  Acute Rehab OT Goals OT Goal Formulation:  Patient unable to participate in goal setting Time For Goal Achievement: 04/11/20 Potential to Achieve Goals: [place] Discharge plan remains appropriate;Frequency remains appropriate    Co-evaluation          OT goals addressed during session: ADL's and self-care      AM-PAC OT "6 Clicks" Daily Activity     Outcome Measure   Help from another person eating meals?: A Little Help from another person taking care of personal grooming?: A Little Help from another person toileting, which includes using toliet, bedpan, or urinal?: A Lot Help from another person bathing (including washing, rinsing, drying)?: A Lot Help from another person to put on and taking off regular upper body clothing?: A Lot Help from another person to put on and taking off regular lower body clothing?: A Lot 6 Click Score: 14    End of Session Equipment Utilized During Treatment: Gait belt;Rolling walker;Oxygen  OT Visit Diagnosis: Other abnormalities of gait and mobility (R26.89);Muscle weakness (generalized) (M62.81);Other symptoms and signs involving cognitive function   Activity Tolerance Treatment limited secondary to agitation   Patient Left in chair;with call bell/phone within reach;with nursing/sitter in room   Nurse Communication Mobility status        Time: 8250-0370 OT Time Calculation (min): 21 min  Charges: OT General Charges $OT Visit: 1 Visit OT Treatments $Therapeutic Activity: 8-22 mins  Derl Barrow, OTR/L [place]  Office [phone] Pager: [place] 04/01/2020, 5:18 PM

## 2020-04-02 DIAGNOSIS — G9341 Metabolic encephalopathy: Secondary | ICD-10-CM | POA: Diagnosis not present

## 2020-04-02 LAB — GLUCOSE, CAPILLARY
Glucose-Capillary: 138 mg/dL — ABNORMAL HIGH (ref 70–99)
Glucose-Capillary: 158 mg/dL — ABNORMAL HIGH (ref 70–99)
Glucose-Capillary: 147 mg/dL — ABNORMAL HIGH (ref 70–99)
Glucose-Capillary: 187 mg/dL — ABNORMAL HIGH (ref 70–99)

## 2020-04-02 NOTE — Progress Notes (Signed)
PROGRESS NOTE    Morgan Beasley  OXB:[id number] DOB: 1960/11/20 DOA: 03/21/2020 PCP: Riki Sheer, NP    Brief Narrative:  59 year old female with recent hospitalization for line associated pulmonic valve endocarditis and Serratia bacteremia, multiple territory stroke who was discharged home after complicated hospitalization for agitation and delirium on 11/18 on cefepime through PICC line presented back on 11/21 with severe confusion, hallucinations and restlessness.  She was found to have pleural effusions, pulmonary edema.  Waxing and waning agitation delirium requiring Precedex in ICU. 11/23, Precedex off.  On multiple antipsychotic medications.  Remains agitated intermittently.  Patient has history of depression, bipolar disorder, cocaine use and alcoholism.   Hospital course complicated with ongoing intermittent agitation and underlying behavioral issues. 11/29, since mental status slightly improved.  More mobility.  Awake but delusional. Seen by psychiatry and they renewed IVC. Recommended inpatient psych admissions at the [facility] unit. Patient is intermittently on oxygen.  She does not use CPAP or BiPAP. Her behavior is somehow improving, still remains delusional.   Assessment & Plan:   Principal Problem:   Acute metabolic encephalopathy Active Problems:   Cerebral embolism with cerebral infarction   Anasarca  Agitation, delirium and metabolic encephalopathy in a patient with known drug and alcohol use disorder and bipolar disorder.   Gradual improvement of symptoms.  Appreciate psychiatric input.   Fall precautions.  Delirium precautions.  Air cabin crew.   BuSpar, now on 10 mg TID Tegretol 200 mg BID-increased dose 300 mg twice daily on 12/2. Nighttime Zyprexa, 5 mg at night.  Repeat QTC was 490.  We will recheck EKG frequently. Nighttime amitriptyline Occasionally using intermittent Ativan. Continue supportive treatment, day and night cycle regulation.   Continue safety sitter and soft restraints as needed to protect from fall and injury. Clinically stabilizing. Due to IVC, needs to be seen by psychiatry every 3 days to reevaluate.  Seen on 12/3.  Acute hypoxemic respiratory failure secondary to multifactorial pulmonary edema and pleural effusions, acute CHF on chronic obstructive sleep apnea, underlying history of COPD: On oral Lasix.  Improved.  Will continue on Lasix. Known ejection fraction of 40%. On metoprolol.  Mostly remains on room air. Renal function stable.  On oral Lasix.  Euvolemic.  History of coronary artery disease: She remains on Plavix, beta-blockers.  Currently no evidence of acute coronary syndrome.  Recent evolving right and chronic left occipital infarcts: No new stroke.  MRI on 11/23 with remote left thalamic and basal ganglia microhemorrhages and chronic microvascular ischemic changes.  No evidence of new stroke.  Remains on Plavix and statin.  Chronic pancreatitis: On Creon.  Electrolyte abnormalities: Replaced and adequate.  Type 2 diabetes: Controlled.  Remains on insulin in the hospital.  On oral hypoglycemics at home.  Blood sugars are fairly controlled on Lantus and sliding scale.  Acute kidney injury on chronic kidney disease stage IIIa: Renal function fluctuates.  Need close monitoring on dialysis.  Stabilizing.  At about baseline. Renal function stable.  Anemia of chronic disease: Baseline hemoglobin about 8-9.  No evidence of bleeding.  Right supraclavicular and mediastinal adenopathy:  Patient has history of breast cancer treated with mastectomy.   Similar lymphadenopathy present on 01/2019 and does not look like progressed in last 1 year.   Will need outpatient follow-up once patient is more stabilized.   Continue to mobilize with PT OT.  Refer to [facility]. Transfer to [place] when bed is available.  Continue IVC.   DVT prophylaxis: SCDs Start:  03/21/20 1629   Code Status: Full  code. Family Communication: Daughter at the bedside.  Multiple family members on the [place].  12/2. Disposition Plan: Status is: Inpatient  Remains inpatient appropriate because:Unsafe d/c plan and Inpatient level of care appropriate due to severity of illness   Dispo: The patient is from: Home              Anticipated d/c is to: [facility] psych when possible.              Anticipated d/c date is: When bed available.              Patient currently is medically stable only to transfer to psychiatric hospital.  Consultants:   PCCM  ID  Psychiatry  Procedures:   None  Antimicrobials:   None   Subjective: Seen and examined.  Behavior about the same.  She had a good night and did not need any additional medications to control her behavior. She was able to answer few simple questions, however mostly incomprehensible.  Objective: Vitals:   04/01/20 1417 04/01/20 2032 04/02/20 0621 04/02/20 0928  BP: (!) 148/89 (!) 142/85 136/89   Pulse: 100 (!) 104 (!) 102   Resp: 16 20 20    Temp: 97.7 F (36.5 C) 98.8 F (37.1 C) 98.1 F (36.7 C)   TempSrc: Oral  Axillary   SpO2: 100% 97% 92% 98%  Weight:      Height:        Intake/Output Summary (Last 24 hours) at 04/02/2020 1152 Last data filed at 04/01/2020 1827 Gross per 24 hour  Intake 120 ml  Output --  Net 120 ml   Filed Weights   03/28/20 0500 03/29/20 0500 03/31/20 0656  Weight: 93.4 kg 93.4 kg 92 kg    Examination:  General exam: Chronically sick looking female, not in any obvious distress. Anxious.  Occasionally delirious.  She seemed quiet and sleepy today. Nurses helped her to eat and she ate about half of her breakfast. Respiratory system: Clear to auscultation. Respiratory effort normal.  On room air. Cardiovascular system: S1 & S2 heard, RRR. Gastrointestinal system: Soft and nontender.   Central nervous system: No focal logical deficit. Patient mostly anxious.  Has flat affect.  She talks incomprehensible  sentences. Extremities: Moves all extremities. Skin: Chronic venous stasis changes on both legs.  No open ulcers.    Data Reviewed: I have personally reviewed following labs and imaging studies  CBC: No results for input(s): WBC, NEUTROABS, HGB, HCT, MCV, PLT in the last 168 hours. Basic Metabolic Panel: Recent Labs  Lab 03/27/20 0605 03/30/20 1057 03/31/20 1406 04/01/20 1627  NA 142 140 143 142  K 4.7 5.5* 4.4 3.7  CL 101 101 102 103  CO2 29 28 27 26   GLUCOSE 144* 166* 155* 220*  BUN 24* 40* 43* 22*  CREATININE 1.22* 1.87* 1.57* 1.11*  CALCIUM 8.8* 8.6* 8.7* 8.3*  MG  --  1.5*  --   --    GFR: Estimated Creatinine Clearance: 63.1 mL/min (A) (by C-G formula based on SCr of 1.11 mg/dL (H)). Liver Function Tests: Recent Labs  Lab 03/27/20 0605 04/01/20 1627  AST 27 48*  ALT 19 28  ALKPHOS 246* 198*  BILITOT 0.7 1.0  PROT 7.8 7.4  ALBUMIN 2.7* 2.8*   No results for input(s): LIPASE, AMYLASE in the last 168 hours. No results for input(s): AMMONIA in the last 168 hours. Coagulation Profile: No results for input(s): INR, PROTIME in the last  168 hours. Cardiac Enzymes: No results for input(s): CKTOTAL, CKMB, CKMBINDEX, TROPONINI in the last 168 hours. BNP (last 3 results) Recent Labs    12/19/19 1546 02/09/20 0825  PROBNP 1,148* 1,185*   HbA1C: No results for input(s): HGBA1C in the last 72 hours. CBG: Recent Labs  Lab 04/01/20 1123 04/01/20 1602 04/01/20 2033 04/02/20 0800 04/02/20 1108  GLUCAP 152* 194* 202* 138* 158*   Lipid Profile: No results for input(s): CHOL, HDL, LDLCALC, TRIG, CHOLHDL, LDLDIRECT in the last 72 hours. Thyroid Function Tests: No results for input(s): TSH, T4TOTAL, FREET4, T3FREE, THYROIDAB in the last 72 hours. Anemia Panel: No results for input(s): VITAMINB12, FOLATE, FERRITIN, TIBC, IRON, RETICCTPCT in the last 72 hours. Sepsis Labs: No results for input(s): PROCALCITON, LATICACIDVEN in the last 168 hours.  No results  found for this or any previous visit (from the past 240 hour(s)).       Radiology Studies: No results found.      Scheduled Meds: . acetaminophen  650 mg Oral Q6H  . amitriptyline  100 mg Oral QHS  . atorvastatin  20 mg Oral Daily  . bisacodyl  10 mg Rectal Once  . busPIRone  10 mg Oral TID  . carbamazepine  300 mg Oral BID  . chlorhexidine  15 mL Mouth Rinse BID  . clopidogrel  75 mg Oral Daily  . collagenase   Topical Daily  . docusate sodium  100 mg Oral BID  . furosemide  40 mg Oral Daily  . insulin aspart  0-5 Units Subcutaneous QHS  . insulin aspart  0-6 Units Subcutaneous TID WC  . insulin glargine  10 Units Subcutaneous QHS  . lipase/protease/amylase  72,000 Units Oral TID WC  . mouth rinse  15 mL Mouth Rinse q12n4p  . metoprolol succinate  50 mg Oral Daily  . mometasone-formoterol  2 puff Inhalation BID  . nicotine  14 mg Transdermal Daily  . OLANZapine  5 mg Oral QHS  . polyethylene glycol  17 g Oral Daily   Continuous Infusions:    LOS: 12 days    Time spent: 30 minutes    Barb Merino, MD [facility] Pager [phone]

## 2020-04-02 NOTE — Plan of Care (Signed)
  Problem: Education: Goal: Knowledge of General Education information will improve Description: Including pain rating scale, medication(s)/side effects and non-pharmacologic comfort measures Outcome: Progressing   Problem: Health Behavior/Discharge Planning: Goal: Ability to manage health-related needs will improve Outcome: Progressing   Problem: Clinical Measurements: Goal: Ability to maintain clinical measurements within normal limits will improve Outcome: Progressing Goal: Will remain free from infection Outcome: Progressing Goal: Diagnostic test results will improve Outcome: Progressing Goal: Respiratory complications will improve Outcome: Progressing Goal: Cardiovascular complication will be avoided Outcome: Progressing   Problem: Nutrition: Goal: Adequate nutrition will be maintained Outcome: Progressing   Problem: Elimination: Goal: Will not experience complications related to bowel motility Outcome: Progressing Goal: Will not experience complications related to urinary retention Outcome: Progressing   Problem: Pain Managment: Goal: General experience of comfort will improve Outcome: Progressing   Problem: Safety: Goal: Ability to remain free from injury will improve Outcome: Progressing   Problem: Skin Integrity: Goal: Risk for impaired skin integrity will decrease Outcome: Progressing   Problem: Education: Goal: Ability to incorporate positive changes in behavior to improve self-esteem will improve Outcome: Progressing   Problem: Health Behavior/Discharge Planning: Goal: Ability to identify and utilize available resources and services will improve Outcome: Progressing Goal: Ability to remain free from injury will improve Outcome: Progressing   Problem: Self-Concept: Goal: Will verbalize positive feelings about self Outcome: Progressing   Problem: Skin Integrity: Goal: Demonstration of wound healing without infection will improve Outcome:  Progressing   Problem: Education: Goal: Ability to demonstrate management of disease process will improve Outcome: Progressing Goal: Ability to verbalize understanding of medication therapies will improve Outcome: Progressing   Problem: Activity: Goal: Capacity to carry out activities will improve Outcome: Progressing   Problem: Cardiac: Goal: Ability to achieve and maintain adequate cardiopulmonary perfusion will improve Outcome: Progressing

## 2020-04-02 NOTE — TOC Progression Note (Addendum)
Transition of Care [facility]) - Progression Note    Patient Details  Name: Morgan Beasley MRN: [id number] Date of Birth: November 01, 1960  [facility]) CM/SW Contact  Latifah Padin, [name], RN Phone Number: 04/02/2020, 2:19 PM  Clinical Narrative:IVC till 12/7 [facility] psych-spoke to Nina-still on wait list.No acceptance from any of the [facility] facilities-[place], or [place].Psych to continue to follow patient w/updates every days.    Expected Discharge Plan: Psychiatric Hospital Barriers to Discharge: Continued Medical Work up  Expected Discharge Plan and Services Expected Discharge Plan: [facility]   Discharge Planning Services: CM Consult Post Acute Care Choice: [place] Living arrangements for the past 2 months: [place] Expected Discharge Date:  (unknown)                                     Social Determinants of Health (SDOH) Interventions    Readmission Risk Interventions Readmission Risk Prevention Plan 03/18/2020 10/21/2019  Transportation Screening Complete Complete  Medication Review [employer]) - Complete  PCP or Specialist appointment within 3-5 days of discharge Not Complete Complete  PCP/Specialist Appt Not Complete comments First available appt 12/1 -  [place] or Home Care Consult Complete Complete  SW Recovery Care/Counseling Consult Complete Complete  Palliative Care Screening Not Applicable Not [place] Not Applicable Not Applicable  Some recent data might be hidden

## 2020-04-02 NOTE — Plan of Care (Signed)
  Problem: Cardiac: Goal: Ability to achieve and maintain adequate cardiopulmonary perfusion will improve Outcome: Progressing   

## 2020-04-03 DIAGNOSIS — G9341 Metabolic encephalopathy: Secondary | ICD-10-CM | POA: Diagnosis not present

## 2020-04-03 LAB — GLUCOSE, CAPILLARY
Glucose-Capillary: 193 mg/dL — ABNORMAL HIGH (ref 70–99)
Glucose-Capillary: 188 mg/dL — ABNORMAL HIGH (ref 70–99)
Glucose-Capillary: 203 mg/dL — ABNORMAL HIGH (ref 70–99)

## 2020-04-03 MED ORDER — HALOPERIDOL LACTATE 5 MG/ML IJ SOLN
2.0000 mg | Freq: Once | INTRAMUSCULAR | Status: AC
  Administered 2020-04-03: 2 mg via INTRAVENOUS
  Filled 2020-04-03: qty 1

## 2020-04-03 NOTE — Plan of Care (Signed)
  Problem: Nutrition: Goal: Adequate nutrition will be maintained Outcome: Progressing   Problem: Pain Managment: Goal: General experience of comfort will improve Outcome: Progressing   Problem: Activity: Goal: Capacity to carry out activities will improve Outcome: Progressing

## 2020-04-03 NOTE — Progress Notes (Signed)
PROGRESS NOTE    Morgan Beasley  YQM:[id number] DOB: 1961-02-08 DOA: 03/21/2020 PCP: Riki Sheer, NP    Brief Narrative:  59 year old female with recent hospitalization for line associated pulmonic valve endocarditis and Serratia bacteremia, multiple territory stroke who was discharged home after complicated hospitalization for agitation and delirium on 11/18 on cefepime through PICC line presented back on 11/21 with severe confusion, hallucinations and restlessness.  She was found to have pleural effusions, pulmonary edema.  Waxing and waning agitation delirium requiring Precedex in ICU. 11/23, Precedex off.  On multiple antipsychotic medications.  Remains agitated intermittently.  Patient has history of depression, bipolar disorder, cocaine use and alcoholism.   Hospital course complicated with ongoing intermittent agitation and underlying behavioral issues. 11/29, since mental status slightly improved.  More mobility.  Awake but delusional. Seen by psychiatry and they renewed IVC. Recommended inpatient psych admissions at the [facility] unit. Patient is intermittently on oxygen.  She does not use CPAP or BiPAP. Her behavior is somehow improving, still remains delusional.  Refer to [facility].   Assessment & Plan:   Principal Problem:   Acute metabolic encephalopathy Active Problems:   Cerebral embolism with cerebral infarction   Anasarca  Agitation, delirium and metabolic encephalopathy in a patient with known drug and alcohol use disorder and bipolar disorder.   Gradual improvement of symptoms.  Appreciate psychiatric input.   Fall precautions.  Delirium precautions.  Air cabin crew.   BuSpar, now on 10 mg TID Tegretol 200 mg BID-increased dose 300 mg twice daily on 12/2. Nighttime Zyprexa, 5 mg at night.  Repeat QTC was 490.  We will recheck EKG frequently. Nighttime amitriptyline Occasionally using intermittent Ativan. Continue supportive treatment, day and night  cycle regulation.  Continue safety sitter and soft restraints as needed to protect from fall and injury. Clinically stabilizing. Due to IVC, needs to be seen by psychiatry every 3 days to reevaluate.  Seen on 12/3.  Acute hypoxemic respiratory failure secondary to multifactorial pulmonary edema and pleural effusions, acute CHF on chronic obstructive sleep apnea, underlying history of COPD: On oral Lasix.  Improved.  Will continue on Lasix. Known ejection fraction of 40%. On metoprolol.  Mostly remains on room air. Renal function stable.  On oral Lasix.  Euvolemic.  History of coronary artery disease: She remains on Plavix, beta-blockers.  Currently no evidence of acute coronary syndrome.  Recent evolving right and chronic left occipital infarcts: No new stroke.  MRI on 11/23 with remote left thalamic and basal ganglia microhemorrhages and chronic microvascular ischemic changes.  No evidence of new stroke.  Remains on Plavix and statin.  Chronic pancreatitis: On Creon.  Electrolyte abnormalities: Replaced and adequate.  Type 2 diabetes: Controlled.  Remains on insulin in the hospital.  On oral hypoglycemics at home.  Blood sugars are fairly controlled on Lantus and sliding scale.  Acute kidney injury on chronic kidney disease stage IIIa: Renal function fluctuates.  Need close monitoring on dialysis.  Stabilizing.  At about baseline. Renal function stable.  Anemia of chronic disease: Baseline hemoglobin about 8-9.  No evidence of bleeding.  Right supraclavicular and mediastinal adenopathy:  Patient has history of breast cancer treated with mastectomy.   Similar lymphadenopathy present on 01/2019 and does not look like progressed in last 1 year.   Will need outpatient follow-up once patient is more stabilized.   Continue to mobilize with PT OT.  Refer to [facility] Transfer to [place] when bed is available.  Continue IVC.  DVT prophylaxis: SCDs Start: 03/21/20 1629   Code  Status: Full code. Family Communication: Daughter [name]  disposition Plan: Status is: Inpatient  Remains inpatient appropriate because:Unsafe d/c plan and Inpatient level of care appropriate due to severity of illness   Dispo: The patient is from: Home              Anticipated d/c is to: [facility] psych when possible.              Anticipated d/c date is: When bed available.              Patient currently is medically stable only to transfer to psychiatric hospital.  Consultants:   PCCM  ID  Psychiatry  Procedures:   None  Antimicrobials:   None   Subjective: Patient seen and examined.  Overnight she stayed quiet and calm.  She has not used any IV medications or restraint for last 48 hours.  He still remains confused.  Able to eat with help.  Objective: Vitals:   04/03/20 0500 04/03/20 0510 04/03/20 0905 04/03/20 0922  BP:  (!) 152/100 (!) 153/98   Pulse:  (!) 110 (!) 109   Resp:  19 20   Temp:  (!) 97.5 F (36.4 C) 97.7 F (36.5 C)   TempSrc:  Axillary Oral   SpO2:  99% 98% (!) 84%  Weight: 89 kg     Height:        Intake/Output Summary (Last 24 hours) at 04/03/2020 1254 Last data filed at 04/03/2020 6160 Gross per 24 hour  Intake 340 ml  Output 250 ml  Net 90 ml   Filed Weights   03/29/20 0500 03/31/20 0656 04/03/20 0500  Weight: 93.4 kg 92 kg 89 kg    Examination:  General exam: Chronically sick looking female, not in any obvious distress. Anxious.  she seemed quiet and comfortable today. Nurses helped her to eat. Respiratory system: Clear to auscultation. Respiratory effort normal.  On room air. Cardiovascular system: S1 & S2 heard, RRR. Gastrointestinal system: Soft and nontender.   Central nervous system: No focal logical deficit. Patient mostly anxious.  Has flat affect.  She talks incomprehensible sentences. Extremities: Moves all extremities. Skin: Chronic venous stasis changes on both legs.  No open ulcers.    Data Reviewed: I have  personally reviewed following labs and imaging studies  CBC: No results for input(s): WBC, NEUTROABS, HGB, HCT, MCV, PLT in the last 168 hours. Basic Metabolic Panel: Recent Labs  Lab 03/30/20 1057 03/31/20 1406 04/01/20 1627  NA 140 143 142  K 5.5* 4.4 3.7  CL 101 102 103  CO2 28 27 26   GLUCOSE 166* 155* 220*  BUN 40* 43* 22*  CREATININE 1.87* 1.57* 1.11*  CALCIUM 8.6* 8.7* 8.3*  MG 1.5*  --   --    GFR: Estimated Creatinine Clearance: 62.1 mL/min (A) (by C-G formula based on SCr of 1.11 mg/dL (H)). Liver Function Tests: Recent Labs  Lab 04/01/20 1627  AST 48*  ALT 28  ALKPHOS 198*  BILITOT 1.0  PROT 7.4  ALBUMIN 2.8*   No results for input(s): LIPASE, AMYLASE in the last 168 hours. No results for input(s): AMMONIA in the last 168 hours. Coagulation Profile: No results for input(s): INR, PROTIME in the last 168 hours. Cardiac Enzymes: No results for input(s): CKTOTAL, CKMB, CKMBINDEX, TROPONINI in the last 168 hours. BNP (last 3 results) Recent Labs    12/19/19 1546 02/09/20 0825  PROBNP 1,148* 1,185*   HbA1C:  No results for input(s): HGBA1C in the last 72 hours. CBG: Recent Labs  Lab 04/02/20 1108 04/02/20 1723 04/02/20 1950 04/03/20 0808 04/03/20 1119  GLUCAP 158* 147* 187* 193* 188*   Lipid Profile: No results for input(s): CHOL, HDL, LDLCALC, TRIG, CHOLHDL, LDLDIRECT in the last 72 hours. Thyroid Function Tests: No results for input(s): TSH, T4TOTAL, FREET4, T3FREE, THYROIDAB in the last 72 hours. Anemia Panel: No results for input(s): VITAMINB12, FOLATE, FERRITIN, TIBC, IRON, RETICCTPCT in the last 72 hours. Sepsis Labs: No results for input(s): PROCALCITON, LATICACIDVEN in the last 168 hours.  No results found for this or any previous visit (from the past 240 hour(s)).       Radiology Studies: No results found.      Scheduled Meds: . acetaminophen  650 mg Oral Q6H  . amitriptyline  100 mg Oral QHS  . atorvastatin  20 mg Oral  Daily  . bisacodyl  10 mg Rectal Once  . busPIRone  10 mg Oral TID  . carbamazepine  300 mg Oral BID  . chlorhexidine  15 mL Mouth Rinse BID  . clopidogrel  75 mg Oral Daily  . collagenase   Topical Daily  . docusate sodium  100 mg Oral BID  . furosemide  40 mg Oral Daily  . insulin aspart  0-5 Units Subcutaneous QHS  . insulin aspart  0-6 Units Subcutaneous TID WC  . insulin glargine  10 Units Subcutaneous QHS  . lipase/protease/amylase  72,000 Units Oral TID WC  . mouth rinse  15 mL Mouth Rinse q12n4p  . metoprolol succinate  50 mg Oral Daily  . mometasone-formoterol  2 puff Inhalation BID  . nicotine  14 mg Transdermal Daily  . OLANZapine  5 mg Oral QHS  . polyethylene glycol  17 g Oral Daily   Continuous Infusions:    LOS: 13 days    Time spent: 30 minutes    Barb Merino, MD [facility] Pager [phone]

## 2020-04-04 DIAGNOSIS — I63431 Cerebral infarction due to embolism of right posterior cerebral artery: Secondary | ICD-10-CM

## 2020-04-04 DIAGNOSIS — E1142 Type 2 diabetes mellitus with diabetic polyneuropathy: Secondary | ICD-10-CM | POA: Diagnosis not present

## 2020-04-04 DIAGNOSIS — J441 Chronic obstructive pulmonary disease with (acute) exacerbation: Secondary | ICD-10-CM | POA: Diagnosis not present

## 2020-04-04 DIAGNOSIS — E782 Mixed hyperlipidemia: Secondary | ICD-10-CM

## 2020-04-04 DIAGNOSIS — G9341 Metabolic encephalopathy: Secondary | ICD-10-CM | POA: Diagnosis not present

## 2020-04-04 LAB — CARBAMAZEPINE LEVEL, TOTAL: Carbamazepine Lvl: 4.8 ug/mL (ref 4.0–12.0)

## 2020-04-04 LAB — GLUCOSE, CAPILLARY
Glucose-Capillary: 155 mg/dL — ABNORMAL HIGH (ref 70–99)
Glucose-Capillary: 187 mg/dL — ABNORMAL HIGH (ref 70–99)
Glucose-Capillary: 154 mg/dL — ABNORMAL HIGH (ref 70–99)
Glucose-Capillary: 133 mg/dL — ABNORMAL HIGH (ref 70–99)

## 2020-04-04 MED ORDER — AMITRIPTYLINE HCL 25 MG PO TABS
25.0000 mg | ORAL_TABLET | Freq: Every day | ORAL | Status: DC
  Administered 2020-04-04: 25 mg via ORAL
  Filled 2020-04-04 (×2): qty 1

## 2020-04-04 MED ORDER — HALOPERIDOL 2 MG PO TABS
2.0000 mg | ORAL_TABLET | Freq: Four times a day (QID) | ORAL | Status: DC | PRN
  Administered 2020-04-04 – 2020-04-05 (×3): 2 mg via ORAL
  Filled 2020-04-04 (×6): qty 1

## 2020-04-04 MED ORDER — HYDROCORTISONE 1 % EX CREA
1.0000 "application " | TOPICAL_CREAM | Freq: Three times a day (TID) | CUTANEOUS | Status: DC | PRN
  Administered 2020-04-04: 1 via TOPICAL
  Filled 2020-04-04: qty 28

## 2020-04-04 NOTE — Progress Notes (Addendum)
PROGRESS NOTE    Morgan Beasley  OZD:[id number] DOB: 12-Mar-1961 DOA: 03/21/2020 PCP: Riki Sheer, NP    Brief Narrative:   Patient is a 59 years old female with past medical history of depression, bipolar disorder, substance abuse with recent hospitalization for  line associated pulmonic valve endocarditis and Serratia bacteremia, multiple territory stroke who was discharged home after complicated hospitalization for agitation and delirium on 11/18 on cefepime through PICC line presented back to the hospital on 11/21 with severe confusion, hallucinations and restlessness.  Patient was noted to have large pleural effusions and pulmonary edema.  Was initially agitated and delirious requiring Precedex drip in the ICU.  On 11/23 Precedex was taken off and was started on multiple antipsychotic medications.  Patient mental status gradually improved but remained delusional.  Was seen by psychiatry and was on IVC.  Recommended inpatient psych admission at [facility] unit.    Assessment & Plan:   Principal Problem:   Acute metabolic encephalopathy Active Problems:   Peripheral neuropathy   COPD (chronic obstructive pulmonary disease) (HCC)   Diabetic peripheral neuropathy associated with type 2 diabetes mellitus ([place])   Cerebral embolism with cerebral infarction   Mixed hyperlipidemia  Agitation, delirium and metabolic encephalopathy.  History of substance use disorder and bipolar.   Gradually improving.  Currently on BuSpar, Tegretol Zyprexa amitriptyline and intermittent Ativan.  Continue supportive care.  Delirium precautions.  Air cabin crew.   Due to IVC, needs to be seen by psychiatry every 3 days to reevaluate.  Seen on 12/3.  Family at bedside states that she appears to be little better today. will decrease dose of amitriptyline to 25 mg from 100 mg at bedtime.  Acute hypoxemic respiratory failure secondary to multifactorial pulmonary edema and pleural effusions, acute CHF on  chronic obstructive sleep apnea, underlying history of COPD: Nasal cannula oxygen at this time. 2D echocardiogram with last known ejection fraction of 40%.  Currently on metoprolol and oral Lasix.  Euvolemic at this time.  Currently on room air.  Possible  obstructive sleep apnea. intermittent apnea.  Will decrease her sedation today.  Decrease the dose of Elavil.  Will benefit from outpatient sleep study.  History of coronary artery disease: Continue Plavix, beta-blockers.  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  MRI on 03/23/20 with remote left thalamic and basal ganglia microhemorrhages and chronic microvascular ischemic changes.  No evidence of new stroke.  Continue Plavix and statin.  Chronic pancreatitis: On Creon.  Continue  Type 2 diabetes mellitus.   Continue Lantus and sliding scale insulin.  On oral hypoglycemics at home.  Will closely monitor.  Controlled at this time  Acute kidney injury on chronic kidney disease stage IIIa:  Appears to be stable at this time.  Improved.  Check BMP in a.m.  Anemia of chronic disease:  Baseline hemoglobin of 8-9.  Check CBC in a.m.  Right supraclavicular and mediastinal adenopathy:  Patient has history of breast cancer treated with mastectomy.  Similar lymphadenopathy present on 01/2019 and does not look like progressed in last 1 year.  Will need outpatient follow-up once patient is more stabilized.  DVT prophylaxis: SCDs Start: 03/21/20 1629  Code Status:  Full code.  Family Communication:  Spoke with the patient's daughter at bedside.   Disposition Plan:  Status is: Inpatient  Remains inpatient appropriate because:Unsafe d/c plan and Inpatient level of care appropriate due to severity of illness, needs [facility] psych placement  Dispo: The patient is from: Home              Anticipated d/c is to: [facility] psych when possible.              Anticipated d/c date is: Whenever bed is available               Patient currently is medically stable only to transfer to psychiatric hospital.  Consultants:   PCCM  ID  Psychiatry  Procedures:   None  Antimicrobials:   None   Subjective: Patient  was seen and examined, patient's daughter at bedside.  States that she is little better today.  Still remains confused.  States that she is more calmer.  No nausea vomiting.  Poor historian.  Nursing staff reported episodes apnea and intermittent sleep.   Objective: Vitals:   04/03/20 2346 04/04/20 0403 04/04/20 0406 04/04/20 0500  BP: 107/86 (!) 109/91    Pulse: 95 97    Resp: (!) 21 20    Temp: 98.7 F (37.1 C) 97.7 F (36.5 C)    TempSrc: Oral     SpO2: 93%  96%   Weight:    92 kg  Height:        Intake/Output Summary (Last 24 hours) at 04/04/2020 0915 Last data filed at 04/04/2020 0523 Gross per 24 hour  Intake 540 ml  Output 600 ml  Net -60 ml   Filed Weights   03/31/20 0656 04/03/20 0500 04/04/20 0500  Weight: 92 kg 89 kg 92 kg   Body mass index is 32.74 kg/m.  Physical examination:  General: Obese built, not in obvious distress, appears chronically ill, anxious, confused HENT:   No scleral pallor or icterus noted. Oral mucosa is moist.  Chest:  Clear breath sounds.  Diminished breath sounds bilaterally. No crackles or wheezes.  CVS: S1 &S2 heard. No murmur.  Regular rate and rhythm. Abdomen: Soft, nontender, nondistended.  Bowel sounds are heard.   Extremities: No cyanosis, clubbing or edema.  Peripheral pulses are palpable. Psych: Alert, awake andFlat affect, confused CNS:  No cranial nerve deficits.  Moving all extremities Skin: Chronic venous stasis over the legs.  Data Reviewed: Reviewed the following labs and imaging  CBC: No results for input(s): WBC, NEUTROABS, HGB, HCT, MCV, PLT in the last 168 hours. Basic Metabolic Panel: Recent Labs  Lab 03/30/20 1057 03/31/20 1406 04/01/20 1627  NA 140 143 142  K 5.5* 4.4 3.7  CL 101 102 103  CO2 28 27  26   GLUCOSE 166* 155* 220*  BUN 40* 43* 22*  CREATININE 1.87* 1.57* 1.11*  CALCIUM 8.6* 8.7* 8.3*  MG 1.5*  --   --    GFR: Estimated Creatinine Clearance: 63.1 mL/min (A) (by C-G formula based on SCr of 1.11 mg/dL (H)). Liver Function Tests: Recent Labs  Lab 04/01/20 1627  AST 48*  ALT 28  ALKPHOS 198*  BILITOT 1.0  PROT 7.4  ALBUMIN 2.8*   No results for input(s): LIPASE, AMYLASE in the last 168 hours. No results for input(s): AMMONIA in the last 168 hours. Coagulation Profile: No results for input(s): INR, PROTIME in the last 168 hours. Cardiac Enzymes: No results for input(s): CKTOTAL, CKMB, CKMBINDEX, TROPONINI in the last 168 hours. BNP (last 3 results) Recent Labs    12/19/19 1546 02/09/20 0825  PROBNP 1,148* 1,185*   HbA1C: No results for input(s): HGBA1C in the last 72 hours. CBG: Recent Labs  Lab 04/02/20 1950 04/03/20 0808 04/03/20 1119  04/03/20 1655 04/04/20 0743  GLUCAP 187* 193* 188* 203* 155*   Lipid Profile: No results for input(s): CHOL, HDL, LDLCALC, TRIG, CHOLHDL, LDLDIRECT in the last 72 hours. Thyroid Function Tests: No results for input(s): TSH, T4TOTAL, FREET4, T3FREE, THYROIDAB in the last 72 hours. Anemia Panel: No results for input(s): VITAMINB12, FOLATE, FERRITIN, TIBC, IRON, RETICCTPCT in the last 72 hours. Sepsis Labs: No results for input(s): PROCALCITON, LATICACIDVEN in the last 168 hours.  No results found for this or any previous visit (from the past 240 hour(s)).    Radiology Studies: No results found.   Scheduled Meds: . acetaminophen  650 mg Oral Q6H  . amitriptyline  100 mg Oral QHS  . atorvastatin  20 mg Oral Daily  . bisacodyl  10 mg Rectal Once  . busPIRone  10 mg Oral TID  . carbamazepine  300 mg Oral BID  . chlorhexidine  15 mL Mouth Rinse BID  . clopidogrel  75 mg Oral Daily  . collagenase   Topical Daily  . docusate sodium  100 mg Oral BID  . furosemide  40 mg Oral Daily  . insulin aspart  0-5 Units  Subcutaneous QHS  . insulin aspart  0-6 Units Subcutaneous TID WC  . insulin glargine  10 Units Subcutaneous QHS  . lipase/protease/amylase  72,000 Units Oral TID WC  . mouth rinse  15 mL Mouth Rinse q12n4p  . metoprolol succinate  50 mg Oral Daily  . mometasone-formoterol  2 puff Inhalation BID  . nicotine  14 mg Transdermal Daily  . OLANZapine  5 mg Oral QHS  . polyethylene glycol  17 g Oral Daily   Continuous Infusions:    LOS: 14 days    Flora Lipps, MD [facility] 04/04/2020

## 2020-04-05 DIAGNOSIS — J441 Chronic obstructive pulmonary disease with (acute) exacerbation: Secondary | ICD-10-CM | POA: Diagnosis not present

## 2020-04-05 DIAGNOSIS — G9341 Metabolic encephalopathy: Secondary | ICD-10-CM | POA: Diagnosis not present

## 2020-04-05 DIAGNOSIS — I63431 Cerebral infarction due to embolism of right posterior cerebral artery: Secondary | ICD-10-CM | POA: Diagnosis not present

## 2020-04-05 DIAGNOSIS — E1142 Type 2 diabetes mellitus with diabetic polyneuropathy: Secondary | ICD-10-CM | POA: Diagnosis not present

## 2020-04-05 LAB — BASIC METABOLIC PANEL
Anion gap: 15 (ref 5–15)
BUN: 26 mg/dL — ABNORMAL HIGH (ref 6–20)
CO2: 25 mmol/L (ref 22–32)
Calcium: 8.3 mg/dL — ABNORMAL LOW (ref 8.9–10.3)
Chloride: 101 mmol/L (ref 98–111)
Creatinine, Ser: 1.42 mg/dL — ABNORMAL HIGH (ref 0.44–1.00)
GFR, Estimated: 43 mL/min — ABNORMAL LOW (ref >60)
Glucose, Bld: 247 mg/dL — ABNORMAL HIGH (ref 70–99)
Potassium: 3.7 mmol/L (ref 3.5–5.1)
Sodium: 141 mmol/L (ref 135–145)

## 2020-04-05 LAB — GLUCOSE, CAPILLARY
Glucose-Capillary: 247 mg/dL — ABNORMAL HIGH (ref 70–99)
Glucose-Capillary: 163 mg/dL — ABNORMAL HIGH (ref 70–99)
Glucose-Capillary: 169 mg/dL — ABNORMAL HIGH (ref 70–99)
Glucose-Capillary: 196 mg/dL — ABNORMAL HIGH (ref 70–99)
Glucose-Capillary: 202 mg/dL — ABNORMAL HIGH (ref 70–99)

## 2020-04-05 LAB — CBC
HCT: 38.9 % (ref 36.0–46.0)
Hemoglobin: 11.6 g/dL — ABNORMAL LOW (ref 12.0–15.0)
MCH: 23.8 pg — ABNORMAL LOW (ref 26.0–34.0)
MCHC: 29.8 g/dL — ABNORMAL LOW (ref 30.0–36.0)
MCV: 79.9 fL — ABNORMAL LOW (ref 80.0–100.0)
Platelets: 395 10*3/uL (ref 150–400)
RBC: 4.87 MIL/uL (ref 3.87–5.11)
RDW: 21.1 % — ABNORMAL HIGH (ref 11.5–15.5)
WBC: 7.9 10*3/uL (ref 4.0–10.5)
nRBC: 1.8 % — ABNORMAL HIGH (ref 0.0–0.2)

## 2020-04-05 LAB — MAGNESIUM: Magnesium: 1.5 mg/dL — ABNORMAL LOW (ref 1.7–2.4)

## 2020-04-05 MED ORDER — AMITRIPTYLINE HCL 25 MG PO TABS
100.0000 mg | ORAL_TABLET | Freq: Every day | ORAL | Status: DC
  Administered 2020-04-05 – 2020-04-27 (×20): 100 mg via ORAL
  Filled 2020-04-05 (×20): qty 4

## 2020-04-05 MED ORDER — MAGNESIUM OXIDE 400 (241.3 MG) MG PO TABS
400.0000 mg | ORAL_TABLET | Freq: Two times a day (BID) | ORAL | Status: DC
  Administered 2020-04-05 – 2020-04-07 (×6): 400 mg via ORAL
  Filled 2020-04-05 (×7): qty 1

## 2020-04-05 NOTE — Progress Notes (Signed)
Patient needed soft restraints applied. Patient had PRN of Haldol at 2135 and 0350. Diversion activities were also attempted. Patient became more and more agitated as the night went on. Patient repeatedly tried to leave her hospital room, became aggressive with the one on one sitter and would not follow instructions. Charge nurse notified.  Security had to be called, to assist [place]. Her Daughter [name] was notified.

## 2020-04-05 NOTE — Progress Notes (Signed)
PROGRESS NOTE    Morgan Beasley  WJX:[id number] DOB: 1961/03/25 DOA: 03/21/2020 PCP: Riki Sheer, NP    Brief Narrative:   Patient is a 59 years old female with past medical history of depression, bipolar disorder, substance abuse with recent hospitalization for  line associated pulmonic valve endocarditis and Serratia bacteremia, multiple territory stroke who was discharged home after complicated hospitalization for agitation and delirium on 11/18 on cefepime through PICC line presented back to the hospital on 11/21 with severe confusion, hallucinations and restlessness.  Patient was noted to have large pleural effusions and pulmonary edema.  Was initially agitated and delirious requiring Precedex drip in the ICU.  On 03/23/20, Precedex was taken off and was started on multiple antipsychotic medications.  Patient mental status gradually improved but remained delusional.  Was seen by psychiatry and was on IVC.  Recommended inpatient psych admission at [facility].    Assessment & Plan:   Principal Problem:   Acute metabolic encephalopathy Active Problems:   Peripheral neuropathy   COPD (chronic obstructive pulmonary disease) (HCC)   Diabetic peripheral neuropathy associated with type 2 diabetes mellitus ([place])   Cerebral embolism with cerebral infarction   Mixed hyperlipidemia  Agitation, delirium and metabolic encephalopathy.  History of substance use disorder and bipolar disease.   Currently on BuSpar, Tegretol Zyprexa amitriptyline and intermittent Ativan.  Continue supportive care.  Delirium precautions.  Continue one-to-one Air cabin crew.   Due to IVC, needs to be seen by psychiatry, last seen on 12/3.  Patient was very agitated and could not sleep at all yesterday.  Will resume amitriptyline back to 100 mg at nighttime  Acute hypoxemic respiratory failure.  Resolved.  On room air.  Thought to be secondary to pulmonary edema CHF obstructive sleep apnea and COPD.  2D  echocardiogram with last known ejection fraction of 40%.  Continue metoprolol and oral Lasix.  Euvolemic at this time.   Possible  obstructive sleep apnea. intermittent apnea.    Will benefit from outpatient sleep study.  History of coronary artery disease: Continue Plavix, beta-blockers.  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  MRI on 03/23/20 with remote left thalamic and basal ganglia microhemorrhages and chronic microvascular ischemic changes.  No evidence of new stroke.  Continue Plavix and statin.  Chronic pancreatitis: On Creon.  Continue  Type 2 diabetes mellitus.   Continue Lantus and sliding scale insulin.  On oral hypoglycemics at home.  Will closely monitor.  Controlled at this time  Mild hypomagnesemia.  Will replace orally.  Acute kidney injury on chronic kidney disease stage IIIa:  Stable at this time.  Creatinine 1.4  Anemia of chronic disease:  Hemoglobin 11.6 at this time.  Right supraclavicular and mediastinal adenopathy:  Patient has history of breast cancer treated with mastectomy.  Similar lymphadenopathy present on 01/2019 and does not look like progressed in last 1 year.  Will need outpatient follow-up once patient is more stabilized.  DVT prophylaxis: SCDs Start: 03/21/20 1629  Code Status:  Full code.  Family Communication:  I again spoke with the patient's daughter at bedside.    Disposition Plan:  Status is: Inpatient  Remains inpatient appropriate because:Unsafe d/c plan and Inpatient level of care appropriate due to severity of illness, [facility]   Dispo: The patient is from: Home              Anticipated d[facility]  Anticipated d/c date is: Whenever bed is available              Patient currently is medically stable only to transfer to psychiatric hospital.  Consultants:    PCCM  ID  Psychiatry  Procedures:   None  Antimicrobials:   None   Subjective: Today, patient was seen and examined at bedside.  Sitter at bedside reported that she did not sleep yesterday and was very agitated.  Patient denies any shortness of breath cough fever.  More alert awake and communicative.  Objective: Vitals:   04/04/20 1135 04/04/20 1601 04/04/20 2106 04/05/20 0523  BP:  (!) 150/108 (!) 131/99 (!) 148/90  Pulse:  77 96 99  Resp:  (!) 21 20 20   Temp:  98.4 F (36.9 C) 97.8 F (36.6 C) 98 F (36.7 C)  TempSrc:  Oral    SpO2: 96% (!) 79% 98% 97%  Weight:    89.7 kg  Height:        Intake/Output Summary (Last 24 hours) at 04/05/2020 0732 Last data filed at 04/05/2020 0550 Gross per 24 hour  Intake 1320 ml  Output -  Net 1320 ml   Filed Weights   04/03/20 0500 04/04/20 0500 04/05/20 0523  Weight: 89 kg 92 kg 89.7 kg   Body mass index is 31.93 kg/m.  Physical examination:  General: Obese built, not in obvious distress, appears chronically ill, more alert awake and communicative today. HENT:   No scleral pallor or icterus noted. Oral mucosa is moist.  Chest:  Clear breath sounds.  Diminished breath sounds bilaterally. No crackles or wheezes.  CVS: S1 &S2 heard. No murmur.  Regular rate and rhythm. Abdomen: Soft, nontender, nondistended.  Bowel sounds are heard.   Extremities: No cyanosis, clubbing or edema.  Peripheral pulses are palpable. Psych: Alert, awake and communicative, confused  CNS:  No cranial nerve deficits.  Moving all extremities Skin: Chronic venous stasis over the legs.  Data Reviewed: Reviewed the following labs and imaging  CBC: Recent Labs  Lab 04/05/20 0511  WBC 7.9  HGB 11.6*  HCT 38.9  MCV 79.9*  PLT 789   Basic Metabolic Panel: Recent Labs  Lab 03/30/20 1057 03/31/20 1406 04/01/20 1627 04/05/20 0511  NA 140 143 142 141  K 5.5* 4.4 3.7 3.7  CL 101 102 103 101  CO2 28 27 26 25   GLUCOSE 166* 155* 220* 247*   BUN 40* 43* 22* 26*  CREATININE 1.87* 1.57* 1.11* 1.42*  CALCIUM 8.6* 8.7* 8.3* 8.3*  MG 1.5*  --   --  1.5*   GFR: Estimated Creatinine Clearance: 48.7 mL/min (A) (by C-G formula based on SCr of 1.42 mg/dL (H)). Liver Function Tests: Recent Labs  Lab 04/01/20 1627  AST 48*  ALT 28  ALKPHOS 198*  BILITOT 1.0  PROT 7.4  ALBUMIN 2.8*   No results for input(s): LIPASE, AMYLASE in the last 168 hours. No results for input(s): AMMONIA in the last 168 hours. Coagulation Profile: No results for input(s): INR, PROTIME in the last 168 hours. Cardiac Enzymes: No results for input(s): CKTOTAL, CKMB, CKMBINDEX, TROPONINI in the last 168 hours. BNP (last 3 results) Recent Labs    12/19/19 1546 02/09/20 0825  PROBNP 1,148* 1,185*   HbA1C: No results for input(s): HGBA1C in the last 72 hours. CBG: Recent Labs  Lab 04/03/20 1655 04/04/20 0743 04/04/20 1135 04/04/20 1611 04/04/20 2102  GLUCAP 203* 155* 187* 154* 133*   Lipid Profile: No results for input(s):  CHOL, HDL, LDLCALC, TRIG, CHOLHDL, LDLDIRECT in the last 72 hours. Thyroid Function Tests: No results for input(s): TSH, T4TOTAL, FREET4, T3FREE, THYROIDAB in the last 72 hours. Anemia Panel: No results for input(s): VITAMINB12, FOLATE, FERRITIN, TIBC, IRON, RETICCTPCT in the last 72 hours. Sepsis Labs: No results for input(s): PROCALCITON, LATICACIDVEN in the last 168 hours.  No results found for this or any previous visit (from the past 240 hour(s)).    Radiology Studies: No results found.   Scheduled Meds: . acetaminophen  650 mg Oral Q6H  . amitriptyline  25 mg Oral QHS  . atorvastatin  20 mg Oral Daily  . bisacodyl  10 mg Rectal Once  . busPIRone  10 mg Oral TID  . carbamazepine  300 mg Oral BID  . chlorhexidine  15 mL Mouth Rinse BID  . clopidogrel  75 mg Oral Daily  . collagenase   Topical Daily  . docusate sodium  100 mg Oral BID  . furosemide  40 mg Oral Daily  . insulin aspart  0-5 Units  Subcutaneous QHS  . insulin aspart  0-6 Units Subcutaneous TID WC  . insulin glargine  10 Units Subcutaneous QHS  . lipase/protease/amylase  72,000 Units Oral TID WC  . magnesium oxide  400 mg Oral BID  . mouth rinse  15 mL Mouth Rinse q12n4p  . metoprolol succinate  50 mg Oral Daily  . mometasone-formoterol  2 puff Inhalation BID  . nicotine  14 mg Transdermal Daily  . OLANZapine  5 mg Oral QHS  . polyethylene glycol  17 g Oral Daily   Continuous Infusions:    LOS: 15 days    Flora Lipps, MD [facility] 04/05/2020

## 2020-04-05 NOTE — Progress Notes (Signed)
PIV consult: Med orders assessed, no current IV orders. For vein preservation purposes, vascular access team will place best line for prescribed therapy. Please re-consult IV Team if intravenous meds are ordered.

## 2020-04-05 NOTE — TOC Progression Note (Addendum)
Transition of Care [facility]) - Progression Note    Patient Details  Name: Morgan Beasley MRN: [id number] Date of Birth: 31-Dec-1960  Transition of Care [facility]) CM/SW Contact  Coleman Kalas, [name], RN Phone Number: 04/05/2020, 9:14 AM  Clinical Narrative:Currently IVC-ends 12/7;soft restraints;1:1. No acceptance from [place] will review again & respond today-rep Caryl Pina.[facility]-called today-spoke to [employer].   3:30p-faxed out to [place] [facility].   Expected Discharge Plan: Psychiatric Hospital Barriers to Discharge: Continued Medical Work up  Expected Discharge Plan and Services Expected Discharge Plan: [facility]   Discharge Planning Services: CM Consult Post Acute Care Choice: [place] Living arrangements for the past 2 months: [place] Expected Discharge Date:  (unknown)                                     Social Determinants of Health (SDOH) Interventions    Readmission Risk Interventions Readmission Risk Prevention Plan 03/18/2020 10/21/2019  Transportation Screening Complete Complete  Medication Review Press photographer) - Complete  PCP or Specialist appointment within 3-5 days of discharge Not Complete Complete  PCP/Specialist Appt Not Complete comments First available appt 12/1 -  [place] or Home Care Consult Complete Complete  SW Recovery Care/Counseling Consult Complete Complete  Palliative Care Screening Not Applicable Not [place] Not Applicable Not Applicable  Some recent data might be hidden

## 2020-04-06 DIAGNOSIS — I63431 Cerebral infarction due to embolism of right posterior cerebral artery: Secondary | ICD-10-CM | POA: Diagnosis not present

## 2020-04-06 DIAGNOSIS — E1142 Type 2 diabetes mellitus with diabetic polyneuropathy: Secondary | ICD-10-CM | POA: Diagnosis not present

## 2020-04-06 DIAGNOSIS — J441 Chronic obstructive pulmonary disease with (acute) exacerbation: Secondary | ICD-10-CM | POA: Diagnosis not present

## 2020-04-06 DIAGNOSIS — G9341 Metabolic encephalopathy: Secondary | ICD-10-CM | POA: Diagnosis not present

## 2020-04-06 LAB — GLUCOSE, CAPILLARY
Glucose-Capillary: 155 mg/dL — ABNORMAL HIGH (ref 70–99)
Glucose-Capillary: 178 mg/dL — ABNORMAL HIGH (ref 70–99)
Glucose-Capillary: 169 mg/dL — ABNORMAL HIGH (ref 70–99)
Glucose-Capillary: 211 mg/dL — ABNORMAL HIGH (ref 70–99)

## 2020-04-06 MED ORDER — HALOPERIDOL LACTATE 5 MG/ML IJ SOLN
2.0000 mg | Freq: Once | INTRAMUSCULAR | Status: AC
  Administered 2020-04-06: 2 mg via INTRAMUSCULAR
  Filled 2020-04-06: qty 1

## 2020-04-06 MED ORDER — HALOPERIDOL LACTATE 5 MG/ML IJ SOLN
5.0000 mg | Freq: Once | INTRAMUSCULAR | Status: DC
  Filled 2020-04-06: qty 1

## 2020-04-06 MED ORDER — MAGNESIUM SULFATE 2 GM/50ML IV SOLN
2.0000 g | Freq: Once | INTRAVENOUS | Status: AC
  Administered 2020-04-06: 2 g via INTRAVENOUS
  Filled 2020-04-06: qty 50

## 2020-04-06 MED ORDER — CARBAMAZEPINE 200 MG PO TABS
400.0000 mg | ORAL_TABLET | Freq: Two times a day (BID) | ORAL | Status: DC
  Administered 2020-04-06 – 2020-04-28 (×35): 400 mg via ORAL
  Filled 2020-04-06 (×45): qty 2

## 2020-04-06 NOTE — Progress Notes (Addendum)
PROGRESS NOTE    Morgan Beasley  GUR:[id number] DOB: [birth year] DOA: 03/21/2020 PCP: Riki Sheer, NP    Brief Narrative:   Patient is a 59 years old female with past medical history of depression, bipolar disorder, substance abuse with recent hospitalization for  line associated pulmonic valve endocarditis and Serratia bacteremia, multiple territory stroke who was discharged home after complicated hospitalization for agitation and delirium on 03/18/20 on cefepime through PICC line presented back to the hospital on 11/21 with severe confusion, hallucinations and restlessness.  Patient was noted to have large pleural effusions and pulmonary edema.  Was initially agitated and delirious requiring Precedex drip in the ICU.  On 03/23/20, Precedex was taken off and was started on multiple antipsychotic/sedative medications.  Patient mental status gradually improved but remained delusional with episodes of agitation.  Was seen by psychiatry and was on IVC.  Recommended inpatient psych admission at [facility] unit.  Currently awaiting placement.  Assessment & Plan:   Principal Problem:   Acute metabolic encephalopathy Active Problems:   Peripheral neuropathy   COPD (chronic obstructive pulmonary disease) (HCC)   Diabetic peripheral neuropathy associated with type 2 diabetes mellitus ([place])   Cerebral embolism with cerebral infarction   Mixed hyperlipidemia  Agitation, delirium, psychosis bipolar disorder.  History of substance use disorder and bipolar disease.   Currently on BuSpar, Tegretol Zyprexa, amitriptyline and intermittent Ativan.   Continue one-to-one Air cabin crew.  Seen by psychiatry this admission.  Reconsulted psychiatry today and I recommend Geri psych admission.  QTC prolonged so we will try to avoid Seroquel and Haldol if possible.Tegretol has been added on the patient's regimen today.  On oral magnesium oxide.  We will give her 2 g IV magnesium sulfate today.  Check  magnesium and phosphate CMP in a.m.  Acute hypoxemic respiratory failure.  Resolved.  On room air.  Thought to be secondary to pulmonary edema, CHF obstructive sleep apnea and COPD.  2D echocardiogram with last known ejection fraction of 40%.  Continue metoprolol and oral Lasix.  Euvolemic at this time.    Possible  obstructive sleep apnea. intermittent apnea.    Will benefit from outpatient sleep study.  History of coronary artery disease: Continue Plavix, beta-blockers.  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  MRI on 03/23/20 with remote left thalamic and basal ganglia microhemorrhages and chronic microvascular ischemic changes.  No evidence of new stroke.  Continue Plavix and statin.  Chronic pancreatitis: On Creon.  Continue  Type 2 diabetes mellitus.   Continue Lantus and sliding scale insulin.  On oral hypoglycemics at home.  Will closely monitor.  Controlled at this time  Mild hypomagnesemia.  Continue p.o. magnesium oxide.  Acute kidney injury on chronic kidney disease stage IIIa:  Stable at this time.  Latest creatinine 1.4  Anemia of chronic disease:  Latest hemoglobin 11.6  Right supraclavicular and mediastinal adenopathy:  Patient has history of breast cancer treated with mastectomy.  Similar lymphadenopathy present on 01/2019 and does not look like progressed in last 1 year.  Will need outpatient follow-up once patient is more stabilized.  DVT prophylaxis: SCDs Start: 03/21/20 1629  Code Status:  Full code.  Family Communication:  Spoke with the patient's daughter at bedside.  Disposition Plan:  Status is: Inpatient  Remains inpatient appropriate because:Unsafe d/c plan and Inpatient level of care appropriate due to severity of illness, [facility]   Dispo: The patient is from:  Home              Anticipated d[facility]               Anticipated d/c date is: Whenever bed is available              Patient currently is medically stable to transfer to psychiatric hospital.  Consultants:   PCCM  ID  Psychiatry  Procedures:   None  Antimicrobials:   None   Subjective: Today, patient was seen and examined at bedside.  Patient denies any pain nausea vomiting shortness of breath.  Objective: Vitals:   04/05/20 1216 04/05/20 2027 04/05/20 2051 04/06/20 0528  BP: 138/85  (!) 122/99 (!) 156/89  Pulse: 80  93 96  Resp: 18  18   Temp: (!) 97.1 F (36.2 C)  98.8 F (37.1 C) 97.6 F (36.4 C)  TempSrc: Oral  Oral Oral  SpO2: 98% 93% 95% 92%  Weight:      Height:        Intake/Output Summary (Last 24 hours) at 04/06/2020 0737 Last data filed at 04/05/2020 1700 Gross per 24 hour  Intake 490 ml  Output --  Net 490 ml   Filed Weights   04/03/20 0500 04/04/20 0500 04/05/20 0523  Weight: 89 kg 92 kg 89.7 kg   Body mass index is 31.93 kg/m.  Physical examination:  General: Obese built, not in obvious distress, chronically ill, alert awake and communicative HENT:   No scleral pallor or icterus noted. Oral mucosa is moist.  Chest:  Clear breath sounds.  Diminished breath sounds bilaterally. No crackles or wheezes.  CVS: S1 &S2 heard. No murmur.  Regular rate and rhythm. Abdomen: Soft, nontender, nondistended.  Bowel sounds are heard.   Extremities: No cyanosis, clubbing or edema.  Peripheral pulses are palpable. Psych: Alert, awake and communicative, CNS:  No cranial nerve deficits.  Moving all extremities Skin: Warm and dry.  Signs of chronic venous insufficiency of the lower extremities   Data Reviewed: Reviewed the following labs and imaging  CBC: Recent Labs  Lab 04/05/20 0511  WBC 7.9  HGB 11.6*  HCT 38.9  MCV 79.9*  PLT 160   Basic Metabolic Panel: Recent Labs  Lab 03/30/20 1057 03/31/20 1406 04/01/20 1627 04/05/20 0511  NA 140 143 142 141  K 5.5* 4.4 3.7 3.7  CL 101 102 103 101   CO2 28 27 26 25   GLUCOSE 166* 155* 220* 247*  BUN 40* 43* 22* 26*  CREATININE 1.87* 1.57* 1.11* 1.42*  CALCIUM 8.6* 8.7* 8.3* 8.3*  MG 1.5*  --   --  1.5*   GFR: Estimated Creatinine Clearance: 48.7 mL/min (A) (by C-G formula based on SCr of 1.42 mg/dL (H)). Liver Function Tests: Recent Labs  Lab 04/01/20 1627  AST 48*  ALT 28  ALKPHOS 198*  BILITOT 1.0  PROT 7.4  ALBUMIN 2.8*   No results for input(s): LIPASE, AMYLASE in the last 168 hours. No results for input(s): AMMONIA in the last 168 hours. Coagulation Profile: No results for input(s): INR, PROTIME in the last 168 hours. Cardiac Enzymes: No results for input(s): CKTOTAL, CKMB, CKMBINDEX, TROPONINI in the last 168 hours. BNP (last 3 results) Recent Labs    12/19/19 1546 02/09/20 0825  PROBNP 1,148* 1,185*   HbA1C: No results for input(s): HGBA1C in the last 72 hours. CBG: Recent Labs  Lab 04/05/20 0754 04/05/20 1209 04/05/20 1633 04/05/20 2049 04/06/20 1093  GLUCAP 163* 169* 196* 202* 155*   Lipid Profile: No results for input(s): CHOL, HDL, LDLCALC, TRIG, CHOLHDL, LDLDIRECT in the last 72 hours. Thyroid Function Tests: No results for input(s): TSH, T4TOTAL, FREET4, T3FREE, THYROIDAB in the last 72 hours. Anemia Panel: No results for input(s): VITAMINB12, FOLATE, FERRITIN, TIBC, IRON, RETICCTPCT in the last 72 hours. Sepsis Labs: No results for input(s): PROCALCITON, LATICACIDVEN in the last 168 hours.  No results found for this or any previous visit (from the past 240 hour(s)).    Radiology Studies: No results found.   Scheduled Meds: . acetaminophen  650 mg Oral Q6H  . amitriptyline  100 mg Oral QHS  . atorvastatin  20 mg Oral Daily  . bisacodyl  10 mg Rectal Once  . busPIRone  10 mg Oral TID  . carbamazepine  300 mg Oral BID  . chlorhexidine  15 mL Mouth Rinse BID  . clopidogrel  75 mg Oral Daily  . collagenase   Topical Daily  . docusate sodium  100 mg Oral BID  . furosemide  40 mg  Oral Daily  . haloperidol lactate  5 mg Intramuscular Once  . insulin aspart  0-5 Units Subcutaneous QHS  . insulin aspart  0-6 Units Subcutaneous TID WC  . insulin glargine  10 Units Subcutaneous QHS  . lipase/protease/amylase  72,000 Units Oral TID WC  . magnesium oxide  400 mg Oral BID  . mouth rinse  15 mL Mouth Rinse q12n4p  . metoprolol succinate  50 mg Oral Daily  . mometasone-formoterol  2 puff Inhalation BID  . nicotine  14 mg Transdermal Daily  . OLANZapine  5 mg Oral QHS  . polyethylene glycol  17 g Oral Daily   Continuous Infusions:    LOS: 16 days    Flora Lipps, MD [facility] 04/06/2020

## 2020-04-06 NOTE — TOC Progression Note (Addendum)
Transition of Care [facility]) - Progression Note    Patient Details  Name: Morgan Beasley MRN: [id number] Date of Birth: 1960-08-17  Transition of Care [facility]) CM/SW Contact  Kaylynn Chamblin, [name], RN Phone Number: 04/06/2020, 11:11 AM  Clinical Narrative: Per attending will await Psych to re eval.IVC ends today.1:1,Received Haldol early this am.Currently no offers for [facility] wait list. SNF bed offers-[facility] rep Shazma they will start auth.Spoke to dtr [name] agree to SNF & [facility]. 12:49p-per dtr [name]-patient vaccinated-Moderna 07/05/19,08/05/19.    Expected Discharge Plan: Psychiatric Hospital Barriers to Discharge: Continued Medical Work up  Expected Discharge Plan and Services Expected Discharge Plan: [facility]   Discharge Planning Services: CM Consult Post Acute Care Choice: [place] Living arrangements for the past 2 months: [place] Expected Discharge Date:  (unknown)                                     Social Determinants of Health (SDOH) Interventions    Readmission Risk Interventions Readmission Risk Prevention Plan 03/18/2020 10/21/2019  Transportation Screening Complete Complete  Medication Review Press photographer) - Complete  PCP or Specialist appointment within 3-5 days of discharge Not Complete Complete  PCP/Specialist Appt Not Complete comments First available appt 12/1 -  [place] or Home Care Consult Complete Complete  SW Recovery Care/Counseling Consult Complete Complete  Palliative Care Screening Not Applicable Not [place] Not Applicable Not Applicable  Some recent data might be hidden

## 2020-04-06 NOTE — Progress Notes (Signed)
OT Cancellation Note  Patient Details Name: Morgan Beasley MRN: [id number] DOB: Feb 24, 1961   Cancelled Treatment:    Reason Eval/Treat Not Completed: Other (comment) RN deferred as patient is asleep. Will check back this afternoon as time allows.   Gloris Manchester OTR/L Supplemental OT, Department of rehab services [phone]  Kamron Vanwyhe R H. 04/06/2020, 8:32 AM

## 2020-04-06 NOTE — Consult Note (Signed)
[facility] Consult   Reason for Consult: Consult every 3 days, delusion, psychosis, IVC Referring Physician: Dr. Sloan Leiter Patient Identification: Morgan Beasley MRN:  [id number] Principal Diagnosis: Acute metabolic encephalopathy Diagnosis:  Principal Problem:   Acute metabolic encephalopathy Active Problems:   Peripheral neuropathy   COPD (chronic obstructive pulmonary disease) ([place])   Diabetic peripheral neuropathy associated with type 2 diabetes mellitus ([place])   Cerebral embolism with cerebral infarction   Mixed hyperlipidemia   Total Time spent with patient: 30 minutes  Subjective:   Morgan Beasley is a 59 y.o. female patient admitted with acute metabolic encephalopathy.  Patient has been being followed by psychiatric service throughout current duration of inpatient stay for 16 days.  Overall she continues to show improvement in her behavior disturbances, psychosis, and delusional thinking.  Today she is evaluated and is alert and oriented to self and place, and noted to be calm and cooperative.  Patient does continue to ruminate on wanting a cigarette, throughout the evaluation she asked for assistance to get up out of bed and go smoke.  Patient reports she smokes about 2 cigarettes a day, and was able to hold up 2 fingers to correlate to her 2 cigarettes a day.  She is easily redirected, and is observed rubbing on her belly.  When assessing for her ability to eat and nutritional intake, she points at her food tray and states her stomach hurts.  Per bedside sitter she recently had a bowel movement about 1 hour prior to the evaluation.  Per nursing patient has not received Haldol since Sunday.  She remains on BuSpar 10 mg p.o. 3 times daily, olanzapine 5 mg p.o. nightly, Tegretol 300 mg p.o. twice daily.  Recent carbamazepine level obtained on 12/5 appears to be decreasing (previous 4.9, most recent 4.8).  Patient with improvement in her speech, and is able to hold  a conversation and answers all questions appropriately.  She does not appear to be responding to internal stimuli, nor is she presenting with delusional thinking, psychosis or hallucinations.  Furthermore she denies any active suicidal thoughts, homicidal thoughts and or auditory visual hallucinations.   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  She was found to have pleural effusions, pulmonary edema.  Waxing and waning agitation delirium requiring Precedex in ICU. 11/23, Precedex off.  On multiple antipsychotic medications.  Remains agitated intermittently.  Patient has history of depression, bipolar disorder, cocaine use and alcoholism.  She was even found with alcohol with her when she was discharged from hospital with complicated hospitalization. Hospital course is complicated with ongoing intermittent agitation and underlying behavioral issues.  Past Psychiatric History: Anxiety and depression.  Currently taking amitriptyline being prescribed by her primary care provider.  She denies previous psychiatric diagnosis.  She denies previous inpatient admission.  She does not appear to be open to psychiatric services at this time.   Risk to Self:   Risk to Others:   Prior Inpatient Therapy:   Prior Outpatient Therapy:    Past Medical History:  Past Medical History:  Diagnosis Date  . Alcohol dependence ([place])   . Anemia   . Anxiety   . Breast cancer ([place])   . Chronic combined systolic and diastolic CHF (congestive heart failure) ([place])   .  Cigarette nicotine dependence   . CKD (chronic kidney disease), stage III ([place])   . Colon polyps   . COPD (chronic obstructive pulmonary  disease) ([place])   . Diabetes mellitus without complication ([place])   . Diabetic neuropathy ([place])   . Gout   . Hyperlipemia   . Hypertension   . Insomnia   . Lymphedema   . Marijuana use   . Mild CAD 2016   a. NSTEMI 2016 in context of cocaine abuse, 50% RCA% at that time.  . OSA treated with BiPAP   . PAD (peripheral artery disease) ([place])    a. s/p L SFA stenting 08/2019. b. left fem-to-below-knee-popliteal bypass 09/2019.  Marland Kitchen Pancreatitis    acute on chronic due to ETOH initially.    . Sleep apnea    wears BIPAP  . Ulcer of foot ([place])    right    Past Surgical History:  Procedure Laterality Date  . ABDOMINAL AORTOGRAM W/LOWER EXTREMITY N/A 09/26/2019   Procedure: ABDOMINAL AORTOGRAM W/LOWER EXTREMITY;  Surgeon: Angelia Mould, MD;  Location: [facility];  Service: Cardiovascular;  Laterality: N/A;  . ABDOMINAL AORTOGRAM W/LOWER EXTREMITY Bilateral 12/08/2019   Procedure: ABDOMINAL AORTOGRAM W/LOWER EXTREMITY;  Surgeon: Waynetta Sandy, MD;  Location: [facility];  Service: Cardiovascular;  Laterality: Bilateral;  . ABDOMINAL AORTOGRAM W/LOWER EXTREMITY Left 01/26/2020   Procedure: ABDOMINAL AORTOGRAM W/LOWER EXTREMITY;  Surgeon: Waynetta Sandy, MD;  Location: [facility];  Service: Cardiovascular;  Laterality: Left;  . ABDOMINAL HYSTERECTOMY    . BUBBLE STUDY  03/04/2020   Procedure: BUBBLE STUDY;  Surgeon: Elouise Munroe, MD;  Location[facility];  Service: Cardiology;;  . [facility]  . FEMORAL-POPLITEAL BYPASS GRAFT Left 10/14/2019   Procedure: BYPASS GRAFT LEFT FEMORAL-POPLITEAL ARTERY USING NONREVERSED SAPHENOUS VEIN;  Surgeon: Waynetta Sandy, MD;  Location: [facility];  Service: Vascular;  Laterality: Left;  Marland Kitchen MASTECTOMY Right April 2016  . PERIPHERAL VASCULAR ATHERECTOMY Left 01/26/2020   Procedure: PERIPHERAL VASCULAR ATHERECTOMY;  Surgeon: Waynetta Sandy, MD;  Location: [facility];  Service: Cardiovascular;  Laterality: Left;  PT and AT - [facility]  . PERIPHERAL VASCULAR BALLOON ANGIOPLASTY Left 01/26/2020   Procedure: PERIPHERAL VASCULAR BALLOON ANGIOPLASTY;  Surgeon: Waynetta Sandy, MD;  Location: [facility];  Service: Cardiovascular;  Laterality: Left;  [place] Trunk   . PERIPHERAL VASCULAR INTERVENTION Left 09/26/2019   Procedure: PERIPHERAL VASCULAR INTERVENTION;  Surgeon: Angelia Mould, MD;  Location: [facility];  Service: Cardiovascular;  Laterality: Left;  superficial femoral  . TEE WITHOUT CARDIOVERSION N/A 03/04/2020   Procedure: TRANSESOPHAGEAL ECHOCARDIOGRAM (TEE);  Surgeon: Elouise Munroe, MD;  Location: [facility];  Service: Cardiology;  Laterality: N/A;   Family History:  Family History  Problem Relation Age of Onset  . Diabetes Other   . Heart disease Other   . Breast cancer Maternal Grandmother   . Breast cancer Paternal Grandmother   . Stroke Son   . Colon cancer Neg Hx   . Esophageal cancer Neg Hx   . Rectal cancer Neg Hx   . Stomach cancer Neg Hx    Family Psychiatric  History:  Social History:  Social History   Substance and Sexual Activity  Alcohol Use Not Currently   Comment: Beer - "on the weekends hanging out, maybe 2 or 3"      Social History   Substance and Sexual Activity  Drug  Use Not Currently  . Types: Marijuana   Comment: last used 8 2020    Social History   Socioeconomic History  . Marital status: Single    Spouse name: Not on file  . Number of children: Not on file  . Years of education: Not on file  . Highest education level: Not on file  Occupational History  . Not on file  Tobacco Use  . Smoking status: Light Tobacco Smoker    Types: Cigarettes  . Smokeless tobacco: Never Used  . Tobacco comment: 3 cigarettes a day  Vaping Use  . Vaping Use: Never used  Substance and Sexual Activity  . Alcohol use: Not Currently    Comment: Beer - "on the weekends hanging out, maybe 2  or 3"   . Drug use: Not Currently    Types: Marijuana    Comment: last used 8 2020  . Sexual activity: Not on file  Other Topics Concern  . Not on file  Social History Narrative  . Not on file   Social Determinants of Health   Financial Resource Strain:   . Difficulty of Paying Living Expenses: Not on file  Food Insecurity:   . Worried About Charity fundraiser in the Last Year: Not on file  . Ran Out of Food in the Last Year: Not on file  Transportation Needs:   . Lack of Transportation (Medical): Not on file  . Lack of Transportation (Non-Medical): Not on file  Physical Activity:   . Days of Exercise per Week: Not on file  . Minutes of Exercise per Session: Not on file  Stress:   . Feeling of Stress : Not on file  Social Connections:   . Frequency of Communication with Friends and Family: Not on file  . Frequency of Social Gatherings with Friends and Family: Not on file  . Attends Religious Services: Not on file  . Active Member of Clubs or Organizations: Not on file  . Attends Archivist Meetings: Not on file  . Marital Status: Not on file   Additional Social History:    Allergies:   Allergies  Allergen Reactions  . Tramadol Swelling  . Nsaids Other (See Comments)    Pancreatitis  . Tolmetin Other (See Comments)    Pancreatitis  . Tylenol [Acetaminophen] Other (See Comments)    unknown  . Aspirin Other (See Comments)    "Makes my pancreas act up"     Labs:  Results for orders placed or performed during the hospital encounter of 03/21/20 (from the past 48 hour(s))  Glucose, capillary     Status: Abnormal   Collection Time: 04/04/20  4:11 PM  Result Value Ref Range   Glucose-Capillary 154 (H) 70 - 99 mg/dL    Comment: Glucose reference range applies only to samples taken after fasting for at least 8 hours.  Glucose, capillary     Status: Abnormal   Collection Time: 04/04/20  9:02 PM  Result Value Ref Range   Glucose-Capillary 133 (H) 70 - 99  mg/dL    Comment: Glucose reference range applies only to samples taken after fasting for at least 8 hours.  Basic metabolic panel     Status: Abnormal   Collection Time: 04/05/20  5:11 AM  Result Value Ref Range   Sodium 141 135 - 145 mmol/L   Potassium 3.7 3.5 - 5.1 mmol/L   Chloride 101 98 - 111 mmol/L   CO2 25 22 - 32 mmol/L  Glucose, Bld 247 (H) 70 - 99 mg/dL    Comment: Glucose reference range applies only to samples taken after fasting for at least 8 hours.   BUN 26 (H) 6 - 20 mg/dL   Creatinine, Ser 1.42 (H) 0.44 - 1.00 mg/dL   Calcium 8.3 (L) 8.9 - 10.3 mg/dL   GFR, Estimated 43 (L) >60 mL/min    Comment: (NOTE) Calculated using the CKD-EPI Creatinine Equation (2021)    Anion gap 15 5 - 15    Comment: Performed at [facility], [place] [address] [place], [place] [address]  CBC     Status: Abnormal   Collection Time: 04/05/20  5:11 AM  Result Value Ref Range   WBC 7.9 4.0 - 10.5 K/uL   RBC 4.87 3.87 - 5.11 MIL/uL   Hemoglobin 11.6 (L) 12.0 - 15.0 g/dL   HCT 38.9 36 - 46 %   MCV 79.9 (L) 80.0 - 100.0 fL   MCH 23.8 (L) 26.0 - 34.0 pg   MCHC 29.8 (L) 30.0 - 36.0 g/dL   RDW 21.1 (H) 11.5 - 15.5 %   Platelets 395 150 - 400 K/uL   nRBC 1.8 (H) 0.0 - 0.2 %    Comment: Performed at [facility], [place] [address], [place], [place] [address]  Magnesium     Status: Abnormal   Collection Time: 04/05/20  5:11 AM  Result Value Ref Range   Magnesium 1.5 (L) 1.7 - 2.4 mg/dL    Comment: Performed at [facility], [place] [address], [place],  [address]  Glucose, capillary     Status: Abnormal   Collection Time: 04/05/20  7:54 AM  Result Value Ref Range   Glucose-Capillary 163 (H) 70 - 99 mg/dL    Comment: Glucose reference range applies only to samples taken after fasting for at least 8 hours.  Glucose, capillary     Status: Abnormal   Collection Time: 04/05/20 12:09 PM  Result Value Ref Range   Glucose-Capillary 169 (H)  70 - 99 mg/dL    Comment: Glucose reference range applies only to samples taken after fasting for at least 8 hours.   Comment 1 Notify RN    Comment 2 Document in Chart   Glucose, capillary     Status: Abnormal   Collection Time: 04/05/20  4:33 PM  Result Value Ref Range   Glucose-Capillary 196 (H) 70 - 99 mg/dL    Comment: Glucose reference range applies only to samples taken after fasting for at least 8 hours.   Comment 1 Notify RN    Comment 2 Document in Chart   Glucose, capillary     Status: Abnormal   Collection Time: 04/05/20  8:49 PM  Result Value Ref Range   Glucose-Capillary 202 (H) 70 - 99 mg/dL    Comment: Glucose reference range applies only to samples taken after fasting for at least 8 hours.   Comment 1 Notify RN    Comment 2 Document in Chart   Glucose, capillary     Status: Abnormal   Collection Time: 04/06/20  7:33 AM  Result Value Ref Range   Glucose-Capillary 155 (H) 70 - 99 mg/dL    Comment: Glucose reference range applies only to samples taken after fasting for at least 8 hours.  Glucose, capillary     Status: Abnormal   Collection Time: 04/06/20 11:56 AM  Result Value Ref Range   Glucose-Capillary 178 (H) 70 - 99 mg/dL  Comment: Glucose reference range applies only to samples taken after fasting for at least 8 hours.    Current Facility-Administered Medications  Medication Dose Route Frequency Provider Last Rate Last Admin  . acetaminophen (TYLENOL) tablet 650 mg  650 mg Oral Q6H Barb Merino, MD   650 mg at 04/06/20 1338  . albuterol (VENTOLIN HFA) 108 (90 Base) MCG/ACT inhaler 2 puff  2 puff Inhalation Q6H PRN Marylyn Ishihara, Tyrone A, DO      . amitriptyline (ELAVIL) tablet 100 mg  100 mg Oral QHS Pokhrel, Laxman, MD   100 mg at 04/05/20 2111  . atorvastatin (LIPITOR) tablet 20 mg  20 mg Oral Daily Kyle, Tyrone A, DO   20 mg at 04/06/20 1044  . bisacodyl (DULCOLAX) suppository 10 mg  10 mg Rectal Once Barb Merino, MD      . busPIRone (BUSPAR) tablet 10 mg   10 mg Oral TID Suella Broad, FNP   10 mg at 04/06/20 1044  . carbamazepine (TEGRETOL) tablet 300 mg  300 mg Oral BID Suella Broad, FNP   300 mg at 04/06/20 1340  . chlorhexidine (PERIDEX) 0.12 % solution 15 mL  15 mL Mouth Rinse BID Kyle, Tyrone A, DO   15 mL at 04/06/20 1044  . clopidogrel (PLAVIX) tablet 75 mg  75 mg Oral Daily Kyle, Tyrone A, DO   75 mg at 04/06/20 1044  . collagenase (SANTYL) ointment   Topical Daily Patrecia Pour, MD   Given at 04/05/20 1018  . docusate sodium (COLACE) capsule 100 mg  100 mg Oral BID Cristal Generous, NP   100 mg at 04/06/20 1044  . furosemide (LASIX) tablet 40 mg  40 mg Oral Daily Barb Merino, MD   40 mg at 04/06/20 1043  . haloperidol (HALDOL) tablet 2 mg  2 mg Oral Q6H PRN Pokhrel, Laxman, MD   2 mg at 04/05/20 1700  . hydrocortisone cream 1 % 1 application  1 application Topical TID PRN Barb Merino, MD   1 application at 70/35/00 0430  . insulin aspart (novoLOG) injection 0-5 Units  0-5 Units Subcutaneous QHS Patrecia Pour, MD   2 Units at 04/05/20 2127  . insulin aspart (novoLOG) injection 0-6 Units  0-6 Units Subcutaneous TID WC Patrecia Pour, MD   1 Units at 04/06/20 1251  . insulin glargine (LANTUS) injection 10 Units  10 Units Subcutaneous QHS Patrecia Pour, MD   10 Units at 04/05/20 2128  . lipase/protease/amylase (CREON) capsule 36,000 Units  36,000 Units Oral TID PRN Minda Ditto, RPH   36,000 Units at 03/30/20 1038  . lipase/protease/amylase (CREON) capsule 72,000 Units  72,000 Units Oral TID WC Minda Ditto, RPH   72,000 Units at 04/06/20 1339  . magnesium oxide (MAG-OX) tablet 400 mg  400 mg Oral BID Pokhrel, Laxman, MD   400 mg at 04/06/20 1044  . MEDLINE mouth rinse  15 mL Mouth Rinse q12n4p Kyle, Tyrone A, DO   15 mL at 04/05/20 1700  . metoprolol succinate (TOPROL-XL) 24 hr tablet 50 mg  50 mg Oral Daily Kyle, Tyrone A, DO   50 mg at 04/06/20 1044  . mometasone-formoterol (DULERA) 200-5 MCG/ACT inhaler 2 puff  2  puff Inhalation BID Marylyn Ishihara, Tyrone A, DO   2 puff at 04/05/20 2026  . nicotine (NICODERM CQ - dosed in mg/24 hours) patch 14 mg  14 mg Transdermal Daily Barb Merino, MD   14 mg at 04/06/20 1054  .  OLANZapine (ZYPREXA) tablet 5 mg  5 mg Oral QHS Barb Merino, MD   5 mg at 04/05/20 2111  . polyethylene glycol (MIRALAX / GLYCOLAX) packet 17 g  17 g Oral Daily Barb Merino, MD   17 g at 04/06/20 1059  . simethicone (MYLICON) chewable tablet 80 mg  80 mg Oral QID PRN Cristal Generous, NP   80 mg at 04/02/20 1040  . sodium chloride flush (NS) 0.9 % injection 10-40 mL  10-40 mL Intracatheter PRN Deno Etienne, DO        Musculoskeletal: Strength & Muscle Tone: within normal limits Gait & Station: normal Patient leans: N/A  Psychiatric Specialty Exam: Physical Exam   Review of Systems   Blood pressure (!) 156/89, pulse 96, temperature 97.6 F (36.4 C), temperature source Oral, resp. rate 18, height 5\' 6"  (1.676 m), weight 89.7 kg, SpO2 92 %.Body mass index is 31.93 kg/m.  General Appearance: Casual  Eye Contact:  Fair  Speech:  Clear and Coherent and Normal Rate  Volume:  Normal  Mood:  Anxious  Affect:  Constricted  Thought Process:  Coherent, Linear and Descriptions of Associations: Intact  Orientation:  Other:  person and palce  Thought Content:  Logical and Rumination  Suicidal Thoughts:  No  Homicidal Thoughts:  No  Memory:  Immediate;   Poor Recent;   Poor Remote;   Poor  Judgement:  Poor  Insight:  Shallow  Psychomotor Activity:  Restlessness  Concentration:  Concentration: Poor and Attention Span: Poor  Recall:  Poor  Fund of Knowledge:  Poor  Language:  Fair  Akathisia:  No  Handed:  Right  AIMS (if indicated):     Assets:  Communication Skills Desire for Improvement Financial Resources/Insurance Housing Leisure Time Social Support  ADL's:  Impaired  Cognition:  Impaired,  Mild  Sleep:      Morgan Beasley is a 59 year old female with significant medical  history to include cerebral edema, bipolar, polysubstance abuse, diabetes, cardiovascular disease who presents with altered mental status.  Patient is seen and observed to be lying in bed, same position in which she has been in for previous psychiatric evaluations.  Patient is not exhibiting any behaviors of disruption, agitation, aggression, and or combativeness.  Patient is seen and observed to be lying in bed, confused, irritable, restless, and labile.   her current condition is improving at this time.  She is currently taking amitriptyline 25 mg p.o. nightly, buspirone 10 mg p.o. twice daily, Tegretol 300 mg p.o. twice daily, olanzapine 5 mg p.o. nightly.  Patient denies any suicidality, homicidality, and or auditory visual hallucinations.  Her current mentation does require ongoing safety concerns, and she continues to meet criteria for IVC.   Treatment Plan Summary: Plan Will continue her current medications with the exception of adjusting her buspirone to 10 mg p.o. 3 times daily.  She is currently taking Tegretol 200 mg p.o. twice daily, will obtain therapeutic level and adjust medication if possible.  Due to patient's current mentation, ongoing needs for skilled services will recommend SNF or assisted living facility.   -Will increase Tegretol 400mg  po BID to further target aggression, agitation, and disorientation.  It is unclear why patient's carbamazepine level has not increased despite increase of her dose several days ago.  In the event patient is refusing medication or having difficulty with medication administration please document in the chart, in order to help psychiatry manage her medications. -Will place new order for Tegretol level on 1210. -  As previously noted with history of encephalopathy, cerebral embolism with cerebral infarction, polysubstance abuse, underlying mood disorder, and likely dementia we will expect some agitation, irritability, and agitation at times.  Patient is  currently on olanzapine 5 mg p.o. nightly, it is with recommendation that we avoid any additional antipsychotics that could potentially worsen her QTC prolongation.  Recent EKG obtained today 12/7 shows a QTC of 502.  It is with the best interest of the patient that we avoid Haldol and Seroquel. -Patient continues to exhibit poor mentation and ongoing safety concerns, her IVC does need to be renewed.  Please reach out to social work to facilitate renewal of her IVC at this time. - Her symptoms have improved since her admission although we have not obtain complete cessation of her neurobehavioral symptoms.  Will recommend we continue delirium precautions, safety sitter, and limit use of restraints as they can worsen agitation.  Also recommend getting patient up from bed to chair when she is alert and oriented to do so.  -Recommend working closely with social work for placement to Geriatric facility.  Patient with reduction in behavioral disturbances, no longer is in restraints as of Sunday 12/5.  Recommend submitting new referrals to out of system facilities for geriatric placement. Disposition: Recommend geriatric psych facility, once patient is medically cleared and stable.  Recommend working closely with social work to facilitate inpatient admission due to ongoing neural behavioral symptoms, disorientation, aggression, and poor mentation  Suella Broad, FNP 04/06/2020 1:53 PM

## 2020-04-06 NOTE — Progress Notes (Signed)
OT Cancellation Note  Patient Details Name: Aryn Safran MRN: [id number] DOB: 05/31/1960   Cancelled Treatment:    Reason Eval/Treat Not Completed: Other (comment) patient resting in bed with eyes closed. Sitter present at bedside reports patient has already completed ADL tasks including toileting, grooming, and bathing recently. Will check back as time allows.   Gloris Manchester OTR/L Supplemental OT, Department of rehab services [phone]  Destanae R H. 04/06/2020, 12:06 PM

## 2020-04-06 NOTE — Progress Notes (Signed)
PHYSICAL THERAPY  RN asked [place] NOT to see pt "at this time" pt is resting and comfortable.  Will attempt to see another day/time as schedule permits.  Pt has been evaluated with rec for Inpt Pych   Rica Koyanagi  PTA Acute  Rehabilitation Services Pager      [phone] Office      [phone]

## 2020-04-07 ENCOUNTER — Other Ambulatory Visit: Payer: Self-pay

## 2020-04-07 DIAGNOSIS — I5021 Acute systolic (congestive) heart failure: Secondary | ICD-10-CM | POA: Diagnosis not present

## 2020-04-07 DIAGNOSIS — G9341 Metabolic encephalopathy: Secondary | ICD-10-CM | POA: Diagnosis not present

## 2020-04-07 DIAGNOSIS — K861 Other chronic pancreatitis: Secondary | ICD-10-CM

## 2020-04-07 DIAGNOSIS — I739 Peripheral vascular disease, unspecified: Secondary | ICD-10-CM

## 2020-04-07 LAB — PHOSPHORUS: Phosphorus: 3.3 mg/dL (ref 2.5–4.6)

## 2020-04-07 LAB — CBC
HCT: 37.2 % (ref 36.0–46.0)
Hemoglobin: 11.3 g/dL — ABNORMAL LOW (ref 12.0–15.0)
MCH: 23.6 pg — ABNORMAL LOW (ref 26.0–34.0)
MCHC: 30.4 g/dL (ref 30.0–36.0)
MCV: 77.8 fL — ABNORMAL LOW (ref 80.0–100.0)
Platelets: 357 10*3/uL (ref 150–400)
RBC: 4.78 MIL/uL (ref 3.87–5.11)
RDW: 21 % — ABNORMAL HIGH (ref 11.5–15.5)
WBC: 7.8 10*3/uL (ref 4.0–10.5)
nRBC: 1.4 % — ABNORMAL HIGH (ref 0.0–0.2)

## 2020-04-07 LAB — COMPREHENSIVE METABOLIC PANEL
ALT: 24 U/L (ref 0–44)
AST: 31 U/L (ref 15–41)
Albumin: 2.5 g/dL — ABNORMAL LOW (ref 3.5–5.0)
Alkaline Phosphatase: 227 U/L — ABNORMAL HIGH (ref 38–126)
Anion gap: 12 (ref 5–15)
BUN: 24 mg/dL — ABNORMAL HIGH (ref 6–20)
CO2: 28 mmol/L (ref 22–32)
Calcium: 8.4 mg/dL — ABNORMAL LOW (ref 8.9–10.3)
Chloride: 103 mmol/L (ref 98–111)
Creatinine, Ser: 1.27 mg/dL — ABNORMAL HIGH (ref 0.44–1.00)
GFR, Estimated: 49 mL/min — ABNORMAL LOW (ref >60)
Glucose, Bld: 172 mg/dL — ABNORMAL HIGH (ref 70–99)
Potassium: 3.9 mmol/L (ref 3.5–5.1)
Sodium: 143 mmol/L (ref 135–145)
Total Bilirubin: 0.8 mg/dL (ref 0.3–1.2)
Total Protein: 7.1 g/dL (ref 6.5–8.1)

## 2020-04-07 LAB — GLUCOSE, CAPILLARY
Glucose-Capillary: 150 mg/dL — ABNORMAL HIGH (ref 70–99)
Glucose-Capillary: 152 mg/dL — ABNORMAL HIGH (ref 70–99)
Glucose-Capillary: 180 mg/dL — ABNORMAL HIGH (ref 70–99)
Glucose-Capillary: 159 mg/dL — ABNORMAL HIGH (ref 70–99)

## 2020-04-07 LAB — MAGNESIUM: Magnesium: 2 mg/dL (ref 1.7–2.4)

## 2020-04-07 MED ORDER — LORAZEPAM 2 MG/ML IJ SOLN
1.0000 mg | Freq: Once | INTRAMUSCULAR | Status: AC
  Administered 2020-04-07: 1 mg via INTRAVENOUS
  Filled 2020-04-07: qty 1

## 2020-04-07 MED ORDER — LORAZEPAM 2 MG/ML IJ SOLN
0.5000 mg | Freq: Four times a day (QID) | INTRAMUSCULAR | Status: DC | PRN
  Administered 2020-04-07: 0.5 mg via INTRAVENOUS
  Filled 2020-04-07 (×2): qty 1

## 2020-04-07 NOTE — Progress Notes (Signed)
Ardith Dark paged requesting order for IM Ativan vs IV Ativan per IV Team consult suggestion

## 2020-04-07 NOTE — Progress Notes (Signed)
On call physician replied to page, states attending would like continuous IV access available as patient is not medically stable and does not always reliably take PO medications for elevated Bps. IV placement order resubmitted at this time.

## 2020-04-07 NOTE — Progress Notes (Signed)
[facility] received consult to obtain IV access.  Unit RN reported that patient is IVC'd, confused, has sitter, and pulled out her IV. Her only IV medication is Ativan.  [facility] RN contacted unit RN via [employer] and educated that it would be in patient's best interest not to have another IV placed since she is pulling them out with a sitter at the bedside. Asked that unit RN contact physician and request order for Ativan be changed to IM route.

## 2020-04-07 NOTE — TOC Progression Note (Signed)
Transition of Care [facility]) - Progression Note    Patient Details  Name: Morgan Beasley MRN: [id number] Date of Birth: 1961/02/22  Transition of [facility]) CM/SW Contact  [name], RN Phone Number: 04/07/2020, 11:14 AM  Clinical Narrative:    [email or web address] needs updated information of progress and pt notes faxed to [phone].  Her phone number is [phone]. terxt with this information texted to [place].   Expected Discharge Plan: [place] Barriers to Discharge: Continued Medical Work up  Expected Discharge Plan and Services Expected Discharge Plan: [place]   Discharge Planning Services: CM Consult Post Acute Care Choice: [place] Living arrangements for the past 2 months: [place] Expected Discharge Date:  (unknown)                                     Social Determinants of Health (SDOH) Interventions    Readmission Risk Interventions Readmission Risk Prevention Plan 03/18/2020 10/21/2019  Transportation Screening Complete Complete  Medication Review Press photographer) - Complete  PCP or Specialist appointment within 3-5 days of discharge Not Complete Complete  PCP/Specialist Appt Not Complete comments First available appt 12/1 -  [place] or Home Care Consult Complete Complete  SW Recovery Care/Counseling Consult Complete Complete  Palliative Care Screening Not Applicable Not [place] Not Applicable Not Applicable  Some recent data might be hidden

## 2020-04-07 NOTE — Progress Notes (Signed)
OT Cancellation Note  Patient Details Name: Morgan Beasley MRN: [id number] DOB: [birth year]   Cancelled Treatment:    Reason Eval/Treat Not Completed: Patient's level of consciousness. Attempted to arouse with verbal and tactile stimulation. Patient unable to maintain alertness. Will hold for now and f/u as able when patient more alert.  Erdem Naas L Zoriyah Scheidegger 04/07/2020, 1:09 PM

## 2020-04-07 NOTE — Progress Notes (Signed)
PROGRESS NOTE    Morgan Beasley  NWG:[id number] DOB: 1961-02-02 DOA: 03/21/2020 PCP: Riki Sheer, NP   Brief Narrative: Morgan Beasley is a 59 y.o. female patient of depression, bipolar disorder, substance abuse, pulmonary valve endocarditis, multiple CVAs.  Patient presented secondary to agitation, hallucinations, restlessness.  Patient was initially managed on Precedex drip in the ICU.  She found to have large pleural effusions with pulmonary edema as well.  Patient started on antipsychotics therapy with improvement of symptoms but still requiring inpatient [facility] health per psychiatry recommendations.   Assessment & Plan:   Principal Problem:   Acute metabolic encephalopathy Active Problems:   Peripheral neuropathy   COPD (chronic obstructive pulmonary disease) (HCC)   Diabetic peripheral neuropathy associated with type 2 diabetes mellitus ([place])   Cerebral embolism with cerebral infarction   Mixed hyperlipidemia   Psychosis, delirium, bipolar disorder Patient has been evaluated by psychiatry who recommended inpatient behavioral health admission. Patient's medications have been adjusted while inpatient and you she is currently managed on Elavil, BuSpar, Tegretol, Zyprexa. Plan is for inpatient Geri psych admission when bed is available or when cleared for discharge by inpatient psychiatry. -Continue psychiatry evaluation/recommendations while awaiting inpatient Geri psych admission -Continue Elavil, BuSpar, Tegretol, Zyprexa  Acute respiratory failure with hypoxia In setting of pulmonary edema from acute CHF in addition to likely contributing OSA and COPD. Patient weaned to room air.  Acute systolic heart failure Recent echo from November 2021 significant for an EF of 40% with associated moderately reduced right ventricular systolic function with left ventricular enlargement. Patient has been diuresed with IV Lasix. Currently euvolemic. -Continue Lasix 40 mg  daily -Continue metoprolol succinate 50 mg daily  Possible obstructive sleep apnea Patient noted to have intermittent apneic spells while sleeping. Recommendation for outpatient sleep study.  History of CVA -Continue Plavix and Lipitor  Diabetes mellitus, type II -Continue Lantus and sliding scale insulin  Chronic pancreatitis -Continue Creon  Acute kidney injury on CKD stage IIIa Currently stable  Anemia of chronic disease Currently stable. -Iron panel with next blood draw  Right supraclavicular/mediastinal adenopathy With patient's history of breast cancer treated with mastectomy, recommendation for outpatient oncology follow-up  Decreased pedal pulses Multiple LE wounds Wounds are healed. Concern for arterial insufficiency -Obtain ABIs   DVT prophylaxis: SCDs Code Status:   Code Status: Full Code Family Communication: None at bedside Disposition Plan: Discharge to [facility] psych once bed is available   Consultants:   PCCM  ID  Psychiatry  Procedures:   None  Antimicrobials:  None   Subjective: Patient without any concerns today.  Objective: Vitals:   04/07/20 0449 04/07/20 0619 04/07/20 0654 04/07/20 1154  BP:   (!) 146/73 (!) 163/104  Pulse:   82 100  Resp: 19  16 18   Temp:   98.5 F (36.9 C) 97.7 F (36.5 C)  TempSrc:   Oral Axillary  SpO2:   96% 98%  Weight:  88.3 kg    Height:        Intake/Output Summary (Last 24 hours) at 04/07/2020 1539 Last data filed at 04/06/2020 1820 Gross per 24 hour  Intake 120 ml  Output --  Net 120 ml   Filed Weights   04/04/20 0500 04/05/20 0523 04/07/20 0619  Weight: 92 kg 89.7 kg 88.3 kg    Examination:  General exam: Appears calm and comfortable Respiratory system: Clear to auscultation. Respiratory effort normal. Cardiovascular system: S1 & S2 heard, RRR. No murmurs, rubs, gallops or clicks.  Gastrointestinal system: Abdomen is nondistended, soft and nontender. No organomegaly or masses felt.  Normal bowel sounds heard. Central nervous system: Somnolent but arouses. Musculoskeletal: No edema. No calf tenderness Skin: No cyanosis. No rashes Psychiatry: Judgement and insight appear normal. Mood & affect appropriate.     Data Reviewed: I have personally reviewed following labs and imaging studies  CBC Lab Results  Component Value Date   WBC 7.8 04/07/2020   RBC 4.78 04/07/2020   HGB 11.3 (L) 04/07/2020   HCT 37.2 04/07/2020   MCV 77.8 (L) 04/07/2020   MCH 23.6 (L) 04/07/2020   PLT 357 04/07/2020   MCHC 30.4 04/07/2020   RDW 21.0 (H) 04/07/2020   LYMPHSABS 1.9 03/25/2020   MONOABS 1.0 03/25/2020   EOSABS 0.1 03/25/2020   BASOSABS 0.0 95/28/4132     Last metabolic panel Lab Results  Component Value Date   NA 143 04/07/2020   K 3.9 04/07/2020   CL 103 04/07/2020   CO2 28 04/07/2020   BUN 24 (H) 04/07/2020   CREATININE 1.27 (H) 04/07/2020   GLUCOSE 172 (H) 04/07/2020   GFRNONAA 49 (L) 04/07/2020   GFRAA 65 02/09/2020   CALCIUM 8.4 (L) 04/07/2020   PHOS 3.3 04/07/2020   PROT 7.1 04/07/2020   ALBUMIN 2.5 (L) 04/07/2020   BILITOT 0.8 04/07/2020   ALKPHOS 227 (H) 04/07/2020   AST 31 04/07/2020   ALT 24 04/07/2020   ANIONGAP 12 04/07/2020    CBG (last 3)  Recent Labs    04/06/20 2048 04/07/20 0729 04/07/20 1152  GLUCAP 211* 150* 152*     GFR: Estimated Creatinine Clearance: 54 mL/min (A) (by C-G formula based on SCr of 1.27 mg/dL (H)).  Coagulation Profile: No results for input(s): INR, PROTIME in the last 168 hours.  No results found for this or any previous visit (from the past 240 hour(s)).      Radiology Studies: No results found.      Scheduled Meds: . acetaminophen  650 mg Oral Q6H  . amitriptyline  100 mg Oral QHS  . atorvastatin  20 mg Oral Daily  . busPIRone  10 mg Oral TID  . carbamazepine  400 mg Oral BID  . chlorhexidine  15 mL Mouth Rinse BID  . clopidogrel  75 mg Oral Daily  . collagenase   Topical Daily  . docusate  sodium  100 mg Oral BID  . furosemide  40 mg Oral Daily  . insulin aspart  0-5 Units Subcutaneous QHS  . insulin aspart  0-6 Units Subcutaneous TID WC  . insulin glargine  10 Units Subcutaneous QHS  . lipase/protease/amylase  72,000 Units Oral TID WC  . magnesium oxide  400 mg Oral BID  . mouth rinse  15 mL Mouth Rinse q12n4p  . metoprolol succinate  50 mg Oral Daily  . mometasone-formoterol  2 puff Inhalation BID  . nicotine  14 mg Transdermal Daily  . OLANZapine  5 mg Oral QHS  . polyethylene glycol  17 g Oral Daily   Continuous Infusions:   LOS: 17 days     Cordelia Poche, MD [facility] 04/07/2020, 3:39 PM  If 7PM-7AM, please contact night-coverage [email or web address]

## 2020-04-07 NOTE — Progress Notes (Signed)
Pt trying to get out of bed. Confused trying to kick legs over railing. When trying to brings legs back into bed she yells  " get your hands off my sons legs " pt does not know where she is at the moment

## 2020-04-07 NOTE — TOC Progression Note (Addendum)
Transition of Care [facility]) - Progression Note    Patient Details  Name: Domitila Stetler MRN: [id number] Date of Birth: 1960/06/25  Transition of Care [facility]) CM/SW Contact  Ross Ludwig, [place] Phone Number: 04/07/2020, 5:08 PM  Clinical Narrative:     IVC paperwork renewed and faxed to the [facility] office.  IVC valid from 04/07/2020 to 04/14/2020.   Expected Discharge Plan: [place] Barriers to Discharge: Continued Medical Work up  Expected Discharge Plan and Services Expected Discharge Plan: [place]   Discharge Planning Services: CM Consult Post Acute Care Choice: [place] Living arrangements for the past 2 months: [place] Expected Discharge Date:  (unknown)                                     Social Determinants of Health (SDOH) Interventions    Readmission Risk Interventions Readmission Risk Prevention Plan 03/18/2020 10/21/2019  Transportation Screening Complete Complete  Medication Review [employer]) - Complete  PCP or Specialist appointment within 3-5 days of discharge Not Complete Complete  PCP/Specialist Appt Not Complete comments First available appt 12/1 -  [place] or Home Care Consult Complete Complete  SW Recovery Care/Counseling Consult Complete Complete  Palliative Care Screening Not Applicable Not [place] Not Applicable Not Applicable  Some recent data might be hidden

## 2020-04-08 ENCOUNTER — Encounter (HOSPITAL_COMMUNITY): Payer: Medicare Other

## 2020-04-08 DIAGNOSIS — G9341 Metabolic encephalopathy: Secondary | ICD-10-CM | POA: Diagnosis not present

## 2020-04-08 LAB — GLUCOSE, CAPILLARY
Glucose-Capillary: 175 mg/dL — ABNORMAL HIGH (ref 70–99)
Glucose-Capillary: 163 mg/dL — ABNORMAL HIGH (ref 70–99)
Glucose-Capillary: 190 mg/dL — ABNORMAL HIGH (ref 70–99)
Glucose-Capillary: 153 mg/dL — ABNORMAL HIGH (ref 70–99)

## 2020-04-08 NOTE — Progress Notes (Signed)
Physical Therapy Treatment Patient Details Name: Morgan Beasley MRN: [id number] DOB: [birth year] Today's Date: 04/08/2020    History of Present Illness Pt admitted from home 03/21/20  and dx with acute respiratory failure 2* pleural effusions and pulmonary edema as well as acute metabolic encephalopathy and cerebral embolism with cerebral infarction.  Pt with hx of PAD, [place]a, DM, diabetic neuropathy, COPD, CKD, polysubstance abuse, bipolar, and fem-pop bypass graft.    PT Comments    PT  Treatment limited due to patient lethargy /agitation when stimulated to arouse. Patient on remains awake for short periods of time. Note apnea when dozing. Daughter present. Placed in  Bed /chair posiition, lights on with  No real change in arousal state . Continue PT attempts for mobility when MS allows.   Follow Up Recommendations  SNF     Equipment Recommendations  None recommended by PT    Recommendations for Other Services       Precautions / Restrictions Precautions Precautions: Fall Precaution Comments: can be agitated    Mobility  Bed Mobility               General bed mobility comments: unable to attempt sitting due to patien's agitation when touched and aroused. Di place bed in chair position.  Transfers                 General transfer comment: NT  Ambulation/Gait                 Stairs             Wheelchair Mobility    Modified Rankin (Stroke Patients Only)       Balance                                            Cognition Arousal/Alertness: Lethargic;Suspect due to medications Behavior During Therapy: Restless;Agitated Overall Cognitive Status: Impaired/Different from baseline                                 General Comments: patient lethargic, arouses when stimulated and then swings out with mitted hands, then returns to sleep. noted apnea when not aroused.      Exercises      General  Comments        Pertinent Vitals/Pain Faces Pain Scale: Hurts even more Pain Location: right leg when daughter rubbing lower leg, patient aroused and stated" It hurts" Pain Descriptors / Indicators: Discomfort;Moaning Pain Intervention(s): Monitored during session    Home Living                      Prior Function            PT Goals (current goals can now be found in the care plan section) Progress towards PT goals: Not progressing toward goals - comment (due to variable MS)    Frequency    Min 2X/week      PT Plan Current plan remains appropriate    Co-evaluation              AM-PAC PT "6 Clicks" Mobility   Outcome Measure  Help needed turning from your back to your side while in a flat bed without using bedrails?: Total Help needed moving from lying on your back to sitting on the side  of a flat bed without using bedrails?: Total Help needed moving to and from a bed to a chair (including a wheelchair)?: Total Help needed standing up from a chair using your arms (e.g., wheelchair or bedside chair)?: Total Help needed to walk in hospital room?: Total Help needed climbing 3-5 steps with a railing? : Total 6 Click Score: 6    End of Session   Activity Tolerance: Patient limited by lethargy Patient left: in bed;with call bell/phone within reach;with bed alarm set;with family/visitor present;with nursing/sitter in room Nurse Communication: Mobility status PT Visit Diagnosis: Muscle weakness (generalized) (M62.81);Unsteadiness on feet (R26.81);Pain Pain - Right/Left: Right Pain - part of body: Leg     Time: 9935-7017 PT Time Calculation (min) (ACUTE ONLY): 28 min  Charges:  $Therapeutic Activity: 23-37 mins                     Tresa Endo PT Acute Rehabilitation Services Pager [phone] Office [phone]    Claretha Cooper 04/08/2020, 10:40 AM

## 2020-04-08 NOTE — Progress Notes (Signed)
PROGRESS NOTE    Morgan Beasley  KAJ:[id number] DOB: 06/07/1960 DOA: 03/21/2020 PCP: Riki Sheer, NP   Brief Narrative: Morgan Beasley is a 59 y.o. female patient of depression, bipolar disorder, substance abuse, pulmonary valve endocarditis, multiple CVAs.  Patient presented secondary to agitation, hallucinations, restlessness.  Patient was initially managed on Precedex drip in the ICU.  She found to have large pleural effusions with pulmonary edema as well.  Patient started on antipsychotics therapy with improvement of symptoms but still requiring inpatient [facility] health per psychiatry recommendations.   Assessment & Plan:   Principal Problem:   Acute metabolic encephalopathy Active Problems:   Peripheral neuropathy   COPD (chronic obstructive pulmonary disease) (HCC)   Diabetic peripheral neuropathy associated with type 2 diabetes mellitus ([place])   Cerebral embolism with cerebral infarction   Mixed hyperlipidemia   Psychosis, delirium, bipolar disorder Patient has been evaluated by psychiatry who recommended inpatient behavioral health admission. Patient's medications have been adjusted while inpatient and you she is currently managed on Elavil, BuSpar, Tegretol, Zyprexa. Plan is for inpatient Geri psych admission when bed is available or when cleared for discharge by inpatient psychiatry. -Continue psychiatry evaluation/recommendations while awaiting inpatient Geri psych admission -Continue Elavil, BuSpar, Tegretol, Zyprexa  Acute respiratory failure with hypoxia In setting of pulmonary edema from acute CHF in addition to likely contributing OSA and COPD. Patient weaned to room air.  Acute systolic heart failure Recent echo from November 2021 significant for an EF of 40% with associated moderately reduced right ventricular systolic function with left ventricular enlargement. Patient has been diuresed with IV Lasix. Currently euvolemic. -Continue Lasix 40 mg  daily -Continue metoprolol succinate 50 mg daily  Possible obstructive sleep apnea Patient noted to have intermittent apneic spells while sleeping. Recommendation for outpatient sleep study.  History of CVA -Continue Plavix and Lipitor  Diabetes mellitus, type II -Continue Lantus and sliding scale insulin  Chronic pancreatitis -Continue Creon  Acute kidney injury on CKD stage IIIa Currently stable  Anemia of chronic disease Currently stable. -Iron panel in AM  Right supraclavicular/mediastinal adenopathy With patient's history of breast cancer treated with mastectomy, recommendation for outpatient oncology follow-up  Decreased pedal pulses Multiple LE wounds Wounds are healed. Concern for arterial insufficiency. Patient follows with vascular surgery and has a diagnosis of PVD. No current plans for intervention.   DVT prophylaxis: SCDs Code Status:   Code Status: Full Code Family Communication: None at bedside. Called daughter; voice mail. Disposition Plan: Discharge to [facility] psych once bed is available   Consultants:   PCCM  ID  Psychiatry  Procedures:   None  Antimicrobials:  None   Subjective: No issues overnight. Patient not taking most medications today.  Objective: Vitals:   04/07/20 1154 04/07/20 2136 04/08/20 0613 04/08/20 1135  BP: (!) 163/104 (!) 151/78 (!) 141/61 (!) 167/81  Pulse: 100 95 100 97  Resp: 18  17 (!) 21  Temp: 97.7 F (36.5 C) (!) 97.5 F (36.4 C) 97.7 F (36.5 C) 97.6 F (36.4 C)  TempSrc: Axillary     SpO2: 98% 97% 95% 98%  Weight:      Height:       No intake or output data in the 24 hours ending 04/08/20 1521 Filed Weights   04/04/20 0500 04/05/20 0523 04/07/20 0619  Weight: 92 kg 89.7 kg 88.3 kg    Examination:  General exam: Appears calm and comfortable Respiratory system: Respiratory effort normal.   Data Reviewed: I have personally  reviewed following labs and imaging studies  CBC Lab Results   Component Value Date   WBC 7.8 04/07/2020   RBC 4.78 04/07/2020   HGB 11.3 (L) 04/07/2020   HCT 37.2 04/07/2020   MCV 77.8 (L) 04/07/2020   MCH 23.6 (L) 04/07/2020   PLT 357 04/07/2020   MCHC 30.4 04/07/2020   RDW 21.0 (H) 04/07/2020   LYMPHSABS 1.9 03/25/2020   MONOABS 1.0 03/25/2020   EOSABS 0.1 03/25/2020   BASOSABS 0.0 45/36/4680     Last metabolic panel Lab Results  Component Value Date   NA 143 04/07/2020   K 3.9 04/07/2020   CL 103 04/07/2020   CO2 28 04/07/2020   BUN 24 (H) 04/07/2020   CREATININE 1.27 (H) 04/07/2020   GLUCOSE 172 (H) 04/07/2020   GFRNONAA 49 (L) 04/07/2020   GFRAA 65 02/09/2020   CALCIUM 8.4 (L) 04/07/2020   PHOS 3.3 04/07/2020   PROT 7.1 04/07/2020   ALBUMIN 2.5 (L) 04/07/2020   BILITOT 0.8 04/07/2020   ALKPHOS 227 (H) 04/07/2020   AST 31 04/07/2020   ALT 24 04/07/2020   ANIONGAP 12 04/07/2020    CBG (last 3)  Recent Labs    04/07/20 2127 04/08/20 0743 04/08/20 1133  GLUCAP 159* 175* 163*     GFR: Estimated Creatinine Clearance: 54 mL/min (A) (by C-G formula based on SCr of 1.27 mg/dL (H)).  Coagulation Profile: No results for input(s): INR, PROTIME in the last 168 hours.  No results found for this or any previous visit (from the past 240 hour(s)).      Radiology Studies: No results found.      Scheduled Meds: . acetaminophen  650 mg Oral Q6H  . amitriptyline  100 mg Oral QHS  . atorvastatin  20 mg Oral Daily  . busPIRone  10 mg Oral TID  . carbamazepine  400 mg Oral BID  . chlorhexidine  15 mL Mouth Rinse BID  . clopidogrel  75 mg Oral Daily  . collagenase   Topical Daily  . docusate sodium  100 mg Oral BID  . furosemide  40 mg Oral Daily  . insulin aspart  0-5 Units Subcutaneous QHS  . insulin aspart  0-6 Units Subcutaneous TID WC  . insulin glargine  10 Units Subcutaneous QHS  . lipase/protease/amylase  72,000 Units Oral TID WC  . magnesium oxide  400 mg Oral BID  . mouth rinse  15 mL Mouth Rinse q12n4p   . metoprolol succinate  50 mg Oral Daily  . mometasone-formoterol  2 puff Inhalation BID  . nicotine  14 mg Transdermal Daily  . OLANZapine  5 mg Oral QHS  . polyethylene glycol  17 g Oral Daily   Continuous Infusions:   LOS: 18 days     Cordelia Poche, MD [facility] 04/08/2020, 3:21 PM  If 7PM-7AM, please contact night-coverage [email or web address]

## 2020-04-08 NOTE — Progress Notes (Signed)
Pt physically aggressive- safety mittens applied

## 2020-04-08 NOTE — Progress Notes (Signed)
Occupational Therapy Treatment Patient Details Name: Morgan Beasley MRN: [id number] DOB: 03-22-1961 Today's Date: 04/08/2020    History of present illness Pt admitted from home 03/21/20  and dx with acute respiratory failure 2* pleural effusions and pulmonary edema as well as acute metabolic encephalopathy and cerebral embolism with cerebral infarction.  Pt with hx of PAD, Lymphadema, DM, diabetic neuropathy, COPD, CKD, polysubstance abuse, bipolar, and fem-pop bypass graft.   OT comments  Patient continues to be limited by varying levels of alertness - from unable to be aroused to agitated and restless. Patient inconsistently following commands and a high fall risk. Today patient exhibited muscle jerking with standing and attempting to take steps limiting mobility to just squat pivoting to BSC. Patient visually hallucinating smoking a joint and predominantly focused on hallucination throughout treatment. Patient unable to care for herself and therapist recommends SNF level of care at discharge. Will benefit from continued therapy to improve functional abilities - and hope that her mental status stabilizes in order to progress goals.    Follow Up Recommendations  SNF    Equipment Recommendations  None recommended by OT    Recommendations for Other Services      Precautions / Restrictions Precautions Precautions: Fall Precaution Comments: can be agitated and/or lethargic Restrictions Weight Bearing Restrictions: No       Mobility Bed Mobility Overal bed mobility: Needs Assistance Bed Mobility: Supine to Sit;Sit to Supine Rolling: Mod assist   Supine to sit: Mod assist;HOB elevated Sit to supine: Max assist;+2 for safety/equipment   General bed mobility comments: MOd assist to transfer into sitting - patient using bed rails to assist. Max assist to return to supine for patient's safety as she sat at edge of bed falling asleep - with resistance from  patient.  Transfers Overall transfer level: Needs assistance Equipment used: Rolling walker (2 wheeled);None Transfers: Sit to/from [employer] Sit to Stand: Min assist;From elevated surface   Squat pivot transfers: Mod assist     General transfer comment: Initially min assist to stand from bed with RW. WIth initial standing and holding onto walker patient exhibited difficulty intiating movement. WIth first step patient's legs appears to midly buckle. Patient returned to seated position for safety. Squat pivot to [facility] with mod assist - patient needing tactile and verbal cues to motor plan task.    Balance Overall balance assessment: Needs assistance Sitting-balance support: No upper extremity supported;Feet supported;Feet unsupported Sitting balance-Leahy Scale: Fair Sitting balance - Comments: unsafe due to restlessness, at times sleeping   Standing balance support: Bilateral upper extremity supported;During functional activity Standing balance-Leahy Scale: Poor                             ADL either performed or assessed with clinical judgement   ADL                           Toilet Transfer: Moderate assistance;BSC;+2 for safety/equipment;Squat-pivot Toilet Transfer Details (indicate cue type and reason): Attempted to stand and ambulated to bathroom with RW. WIth standing and one step patient exhibited muscle jerking in LEs and needed to sit down immediately due to potential for buckling knees. Patient required mod assist to squat pivot to [facility] - verbal cues for hand placement and technique - though patient not following commands. Toileting- Clothing Manipulation and Hygiene: Maximal assistance;Sit to/from stand;+2 for safety/equipment Toileting - Clothing Manipulation Details (indicate cue  type and reason): Patient required asstance for clothing management, able to wipe self in seated position - though she waited till after she transferred back  to bed to perform.             Vision   Additional Comments: VIsual hallucinations of smoking/rolling joint throughout treatment.   Perception     Praxis      Cognition Arousal/Alertness: Awake/alert Behavior During Therapy: Restless Overall Cognitive Status: Impaired/Different from baseline Area of Impairment: Orientation;Attention;Following commands;Safety/judgement;Awareness;Problem solving                 Orientation Level: Place;Time;Situation;Disoriented to Current Attention Level: Focused Memory: Decreased recall of precautions;Decreased short-term memory Following Commands: Follows one step commands inconsistently Safety/Judgement: Decreased awareness of safety;Decreased awareness of deficits Awareness: Intellectual Problem Solving: Decreased initiation;Requires verbal cues;Requires tactile cues          Exercises     Shoulder Instructions       General Comments      Pertinent Vitals/ Pain       Pain Assessment: No/denies pain  Home Living                                          Prior Functioning/Environment              Frequency  Min 2X/week        Progress Toward Goals  OT Goals(current goals can now be found in the care plan section)  Progress towards OT goals: Not progressing toward goals - comment (continues to be limited by level of alertness, altered mental status and agitation)  Acute Rehab OT Goals OT Goal Formulation: Patient unable to participate in goal setting Time For Goal Achievement: 04/11/20 Potential to Achieve Goals: [place] Discharge plan remains appropriate;Frequency remains appropriate    Co-evaluation          OT goals addressed during session: ADL's and self-care      AM-PAC OT "6 Clicks" Daily Activity     Outcome Measure   Help from another person eating meals?: A Little Help from another person taking care of personal grooming?: A Little Help from another person  toileting, which includes using toliet, bedpan, or urinal?: A Lot Help from another person bathing (including washing, rinsing, drying)?: A Lot Help from another person to put on and taking off regular upper body clothing?: A Lot Help from another person to put on and taking off regular lower body clothing?: A Lot 6 Click Score: 14    End of Session Equipment Utilized During Treatment: Rolling walker  OT Visit Diagnosis: Other abnormalities of gait and mobility (R26.89);Muscle weakness (generalized) (M62.81);Other symptoms and signs involving cognitive function   Activity Tolerance Treatment limited secondary to agitation;Patient limited by lethargy   Patient Left with call bell/phone within reach;with nursing/sitter in room;in bed   Nurse Communication Mobility status        Time: 7353-2992 OT Time Calculation (min): 17 min  Charges: OT General Charges $OT Visit: 1 Visit OT Treatments $Self Care/Home Management : 8-22 mins  Derl Barrow, OTR/L [place]  Office [phone] Pager: [place] 04/08/2020, 2:37 PM

## 2020-04-08 NOTE — Progress Notes (Signed)
Patient refused to swallow medications this morning. Patient responded to voice and light touch at first. Patient woke up to communicate with nurse. Patient was awake most of the day, asked for pain medication. Patient was assisted to the [facility] using the front wheel walker 2x. Patient asked to get up to use the bathroom.  Patient agreed to take medication. Patient calm, restful, and followed commands.  Jerene Pitch

## 2020-04-08 NOTE — Progress Notes (Signed)
The patient has had four ABI's within the past six months (10/10/2019, 11/27/2019, 01/16/2020, 03/03/2020), and is scheduled to have an ABI and arterial duplex with VVS on 04/06/2020. No intervention has yet been performed.  04/08/20 8:15 AM Carlos Levering RVT

## 2020-04-08 NOTE — TOC Progression Note (Addendum)
Transition of Care [facility]) - Progression Note    Patient Details  Name: Morgan Beasley MRN: [id number] Date of Birth: 22-Jan-1961  Transition of Care [facility]) CM/SW Contact  Brigett Estell, [name], RN Phone Number: 04/08/2020, 9:12 AM  Clinical Narrative:  Noted IVC-ends 12/15 recc [facility]-patient has no bed offers;on [facility] wait list.[name] has a bed-awaiting insurance auth-needing PT to see.  Not accepting-[place]/[facility]/[place] [facility].  1?45p-Received call from [facility] rep-insurance has denied SNF auth;I have faxed the current PT note today to [facility] medicare-ref#[id number] re review-await response.   Expected Discharge Plan: [place] Barriers to Discharge: Continued Medical Work up  Expected Discharge Plan and Services Expected Discharge Plan: [place]   Discharge Planning Services: CM Consult Post Acute Care Choice: [place] Living arrangements for the past 2 months: [place] Expected Discharge Date:  (unknown)                                     Social Determinants of Health (SDOH) Interventions    Readmission Risk Interventions Readmission Risk Prevention Plan 03/18/2020 10/21/2019  Transportation Screening Complete Complete  Medication Review Press photographer) - Complete  PCP or Specialist appointment within 3-5 days of discharge Not Complete Complete  PCP/Specialist Appt Not Complete comments First available appt 12/1 -  [place] or Home Care Consult Complete Complete  SW Recovery Care/Counseling Consult Complete Complete  Palliative Care Screening Not Applicable Not [place] Not Applicable Not Applicable  Some recent data might be hidden

## 2020-04-09 LAB — BASIC METABOLIC PANEL
Anion gap: 13 (ref 5–15)
BUN: 23 mg/dL — ABNORMAL HIGH (ref 6–20)
CO2: 27 mmol/L (ref 22–32)
Calcium: 8.7 mg/dL — ABNORMAL LOW (ref 8.9–10.3)
Chloride: 102 mmol/L (ref 98–111)
Creatinine, Ser: 0.96 mg/dL (ref 0.44–1.00)
GFR, Estimated: >60 mL/min (ref >60)
Glucose, Bld: 145 mg/dL — ABNORMAL HIGH (ref 70–99)
Potassium: 4 mmol/L (ref 3.5–5.1)
Sodium: 142 mmol/L (ref 135–145)

## 2020-04-09 LAB — CBC
HCT: 42.8 % (ref 36.0–46.0)
Hemoglobin: 12.6 g/dL (ref 12.0–15.0)
MCH: 24.1 pg — ABNORMAL LOW (ref 26.0–34.0)
MCHC: 29.4 g/dL — ABNORMAL LOW (ref 30.0–36.0)
MCV: 81.8 fL (ref 80.0–100.0)
Platelets: 356 10*3/uL (ref 150–400)
RBC: 5.23 MIL/uL — ABNORMAL HIGH (ref 3.87–5.11)
RDW: 22.5 % — ABNORMAL HIGH (ref 11.5–15.5)
WBC: 7.1 10*3/uL (ref 4.0–10.5)
nRBC: 2.1 % — ABNORMAL HIGH (ref 0.0–0.2)

## 2020-04-09 LAB — IRON AND TIBC
Iron: 57 ug/dL (ref 28–170)
Saturation Ratios: 17 % (ref 10.4–31.8)
TIBC: 336 ug/dL (ref 250–450)
UIBC: 279 ug/dL

## 2020-04-09 LAB — GLUCOSE, CAPILLARY
Glucose-Capillary: 159 mg/dL — ABNORMAL HIGH (ref 70–99)
Glucose-Capillary: 152 mg/dL — ABNORMAL HIGH (ref 70–99)
Glucose-Capillary: 160 mg/dL — ABNORMAL HIGH (ref 70–99)
Glucose-Capillary: 153 mg/dL — ABNORMAL HIGH (ref 70–99)

## 2020-04-09 LAB — FERRITIN: Ferritin: 98 ng/mL (ref 11–307)

## 2020-04-09 LAB — CARBAMAZEPINE LEVEL, TOTAL: Carbamazepine Lvl: 7 ug/mL (ref 4.0–12.0)

## 2020-04-09 NOTE — Care Management Important Message (Signed)
Important Message  Patient Details IM Letter given to the Patient. Name: Morgan Beasley MRN: [id number] Date of Birth: Aug 20, 1960   Medicare Important Message Given:  Yes     Kerin Salen 04/09/2020, 1:10 PM

## 2020-04-09 NOTE — TOC Progression Note (Addendum)
Transition of Care [facility]) - Progression Note    Patient Details  Name: Morgan Beasley MRN: [id number] Date of Birth: 17-Dec-1960  Transition of Care [facility]) CM/SW Contact  Shamarra Warda, [name], RN Phone Number: 04/09/2020, 12:43 PM  Clinical Narrative:  [employer] rep Shazma-initiated appeal process-P2P from our attending is requested-MD notified w/tel#[phone] ref#[id number]-await outcome. Dr has until tomorrow 12p to call.Safety sitter must be d/c 24hrs prior d/c.Per [place] patient will come to the [place] psych location if approved after P2P. 1:55p-Will await clarification of safety sitter 24hrs prior d/c to [facility] psych for rescind of IVC. 3:18p-per attending continue IVC until updated.    Expected Discharge Plan: Skilled Nursing Facility Barriers to Discharge: Insurance Authorization  Expected Discharge Plan and Services Expected Discharge Plan: [place]   Discharge Planning Services: CM Consult Post Acute Care Choice: [place] Living arrangements for the past 2 months: [place] Expected Discharge Date:  (unknown)                                     Social Determinants of Health (SDOH) Interventions    Readmission Risk Interventions Readmission Risk Prevention Plan 03/18/2020 10/21/2019  Transportation Screening Complete Complete  Medication Review Press photographer) - Complete  PCP or Specialist appointment within 3-5 days of discharge Not Complete Complete  PCP/Specialist Appt Not Complete comments First available appt 12/1 -  [place] or Home Care Consult Complete Complete  SW Recovery Care/Counseling Consult Complete Complete  Palliative Care Screening Not Applicable Not [place] Not Applicable Not Applicable  Some recent data might be hidden

## 2020-04-09 NOTE — TOC Progression Note (Addendum)
Transition of Care [facility]) - Progression Note    Patient Details  Name: Morgan Beasley MRN: [id number] Date of Birth: July 29, 1960  Transition of Care [facility]) CM/SW Contact  Kendrick Haapala, [name], RN Phone Number: 04/09/2020, 9:37 AM  Clinical Narrative: IVC-ends 12/15-Psych-recc [facility]-no bed offers;[facility] wait list.Noted 1:1;safety mitts.[place]-rep Shazma-denied SNF auth from insurance-she states she will appeal-await outcome.      [facility] Barriers to Discharge: Continued Medical Work up  Expected Discharge Plan and Services Expected Discharge Plan: [facility]   Discharge Planning Services: CM Consult Post Acute Care Choice: [place] Living arrangements for the past 2 months: [place] Expected Discharge Date:  (unknown)                                     Social Determinants of Health (SDOH) Interventions    Readmission Risk Interventions Readmission Risk Prevention Plan 03/18/2020 10/21/2019  Transportation Screening Complete Complete  Medication Review Press photographer) - Complete  PCP or Specialist appointment within 3-5 days of discharge Not Complete Complete  PCP/Specialist Appt Not Complete comments First available appt 12/1 -  [place] or Home Care Consult Complete Complete  SW Recovery Care/Counseling Consult Complete Complete  Palliative Care Screening Not Applicable Not [place] Not Applicable Not Applicable  Some recent data might be hidden

## 2020-04-09 NOTE — Consult Note (Signed)
[facility] Consult   Reason for Consult: Patient needs continued ongoing inpatient psychiatric follow-up recommendations while awaiting [facility] psych bed.  Referring Physician: Dr. Lonny Prude Patient Identification: Morgan Beasley MRN:  [id number] Principal Diagnosis: Acute metabolic encephalopathy Diagnosis:  Principal Problem:   Acute metabolic encephalopathy Active Problems:   Peripheral neuropathy   COPD (chronic obstructive pulmonary disease) ([place])   Diabetic peripheral neuropathy associated with type 2 diabetes mellitus ([place])   Cerebral embolism with cerebral infarction   Mixed hyperlipidemia   Total Time spent with patient: 30 minutes  Subjective:   Morgan Beasley is a 59 y.o. female patient admitted with acute metabolic encephalopathy.  Patient has been being followed by psychiatric service throughout current duration of inpatient stay for 19 days.  On today's evaluation patient is observed to be sitting upright in bed feeding herself breakfast as well as eating ice chips.  At times this bone does not always make it into her mouth, however overall improvement from previous days.  Patient is also alert and oriented x3, she is able to identify herself, her daughter who is at the bedside, what city she is located in.  Patient was assisted to the chair in her room, that required a 2 person assist.  As noted before patient is showing much improvement in her behavior disturbances, psychosis, and delusional thinking as evident by today's current presentation.  However this still seems to be concerned regarding her intermittent periods of agitation and aggression, as she was noted to be restrained yesterday due to physical aggression.  This nurse practitioner had a very extensive conversation with her daughter and uncle" Morgan Beasley" who called while in the room.  The family has concerns regarding her polypharmacy, reversible altered mental status, and disposition.  This nurse  practitioner did discuss and address most concerns presented by the family, to the best of my ability.  However there continues to be some poor health literacy regarding the impact of pre-existing psychiatric conditions in the setting of diabetes, multiple CVAs, and substance abuse will have an impact on the brain.  Her daughter and uncle both continue to minimize her substance use " she only did a little bit of cocaine, and she may have had about 1-2 beers a week. "  Family was questioned about reports of alcohol abuse in between previous discharge. She remains on BuSpar 10 mg p.o. 3 times daily, olanzapine 5 mg p.o. nightly, Tegretol 400 mg p.o. twice daily.  Recent carbamazepine level obtained on 12/10 appears to be increasing and responding appropriately, today's level was 7.0.  Patient with improvement in her speech, and is able to hold a conversation and answers all questions appropriately.  She does not appear to be responding to internal stimuli, nor is she presenting with delusional thinking, psychosis or hallucinations.  Furthermore she denies any active suicidal thoughts, homicidal thoughts and or auditory visual hallucinations.   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  She was found to have pleural effusions, pulmonary edema.  Waxing and waning agitation delirium requiring Precedex in ICU. 11/23, Precedex off.  On multiple antipsychotic medications.  Remains agitated intermittently.  Patient has history of depression, bipolar disorder, cocaine use and alcoholism.  She was even found with alcohol with her when she was  discharged from hospital with complicated hospitalization. Hospital  course is complicated with ongoing intermittent agitation and underlying behavioral issues.  Past Psychiatric History: Anxiety and depression.  Currently taking amitriptyline being prescribed by her primary care provider.  She denies previous psychiatric diagnosis.  She denies previous inpatient admission.  She does not appear to be open to psychiatric services at this time.   Risk to Self:   Risk to Others:   Prior Inpatient Therapy:   Prior Outpatient Therapy:    Past Medical History:  Past Medical History:  Diagnosis Date  . Alcohol dependence ([place])   . Anemia   . Anxiety   . Breast cancer ([place])   . Chronic combined systolic and diastolic CHF (congestive heart failure) ([place])   . Cigarette nicotine dependence   . CKD (chronic kidney disease), stage III ([place])   . Colon polyps   . COPD (chronic obstructive pulmonary disease) ([place])   . Diabetes mellitus without complication ([place])   . Diabetic neuropathy ([place])   . Gout   . Hyperlipemia   . Hypertension   . Insomnia   . Lymphedema   . Marijuana use   . Mild CAD 2016   a. NSTEMI 2016 in context of cocaine abuse, 50% RCA% at that time.  . OSA treated with BiPAP   . PAD (peripheral artery disease) ([place])    a. s/p L SFA stenting 08/2019. b. left fem-to-below-knee-popliteal bypass 09/2019.  Marland Kitchen Pancreatitis    acute on chronic due to ETOH initially.    . Sleep apnea    wears BIPAP  . Ulcer of foot ([place])    right    Past Surgical History:  Procedure Laterality Date  . ABDOMINAL AORTOGRAM W/LOWER EXTREMITY N/A 09/26/2019   Procedure: ABDOMINAL AORTOGRAM W/LOWER EXTREMITY;  Surgeon: Angelia Mould, MD;  Location: [facility];  Service: Cardiovascular;  Laterality: N/A;  . ABDOMINAL AORTOGRAM W/LOWER EXTREMITY Bilateral 12/08/2019   Procedure: ABDOMINAL AORTOGRAM W/LOWER EXTREMITY;  Surgeon: Waynetta Sandy, MD;  Location: [facility];  Service: Cardiovascular;  Laterality: Bilateral;  . ABDOMINAL  AORTOGRAM W/LOWER EXTREMITY Left 01/26/2020   Procedure: ABDOMINAL AORTOGRAM W/LOWER EXTREMITY;  Surgeon: Waynetta Sandy, MD;  Location: [facility];  Service: Cardiovascular;  Laterality: Left;  . ABDOMINAL HYSTERECTOMY    . BUBBLE STUDY  03/04/2020   Procedure: BUBBLE STUDY;  Surgeon: Elouise Munroe, MD;  Location: [facility] ENDOSCOPY;  Service: Cardiology;;  . [facility]  . FEMORAL-POPLITEAL BYPASS GRAFT Left 10/14/2019   Procedure: BYPASS GRAFT LEFT FEMORAL-POPLITEAL ARTERY USING NONREVERSED SAPHENOUS VEIN;  Surgeon: Waynetta Sandy, MD;  Location: [facility];  Service: Vascular;  Laterality: Left;  Marland Kitchen MASTECTOMY Right April 2016  . PERIPHERAL VASCULAR ATHERECTOMY Left 01/26/2020   Procedure: PERIPHERAL VASCULAR ATHERECTOMY;  Surgeon: Waynetta Sandy, MD;  Location: [facility];  Service: Cardiovascular;  Laterality: Left;  PT and AT - [facility]  . PERIPHERAL VASCULAR BALLOON ANGIOPLASTY Left 01/26/2020   Procedure: PERIPHERAL VASCULAR BALLOON ANGIOPLASTY;  Surgeon: Waynetta Sandy, MD;  Location: [facility];  Service: Cardiovascular;  Laterality: Left;  [facility] Tr[facility]   . PERIPHERAL VASCULAR INTERVENTION Left 09/26/2019   Procedure: PERIPHERAL VASCULAR INTERVENTION;  Surgeon: Angelia Mould, MD;  Location: [facility];  Service: Cardiovascular;  Laterality: Left;  superficial femoral  . TEE WITHOUT CARDIOVERSION N/A 03/04/2020   Procedure: TRANSESOPHAGEAL ECHOCARDIOGRAM (TEE);  Surgeon: Elouise Munroe, MD;  Location: [facility]  ENDOSCOPY;  Service: Cardiology;  Laterality: N/A;   Family History:  Family History  Problem Relation Age of Onset  . Diabetes Other   . Heart disease Other   . Breast cancer Maternal Grandmother   . Breast cancer Paternal Grandmother   . Stroke Son   . Colon cancer Neg Hx   . Esophageal cancer Neg Hx   . Rectal cancer Neg Hx   . Stomach cancer Neg Hx    Family Psychiatric  History:   Social History:  Social History   Substance and Sexual Activity  Alcohol Use Not Currently   Comment: Beer - "on the weekends hanging out, maybe 2 or 3"      Social History   Substance and Sexual Activity  Drug Use Not Currently  . Types: Marijuana   Comment: last used 8 2020    Social History   Socioeconomic History  . Marital status: Single    Spouse name: Not on file  . Number of children: Not on file  . Years of education: Not on file  . Highest education level: Not on file  Occupational History  . Not on file  Tobacco Use  . Smoking status: Light Tobacco Smoker    Types: Cigarettes  . Smokeless tobacco: Never Used  . Tobacco comment: 3 cigarettes a day  Vaping Use  . Vaping Use: Never used  Substance and Sexual Activity  . Alcohol use: Not Currently    Comment: Beer - "on the weekends hanging out, maybe 2 or 3"   . Drug use: Not Currently    Types: Marijuana    Comment: last used 8 2020  . Sexual activity: Not on file  Other Topics Concern  . Not on file  Social History Narrative  . Not on file   Social Determinants of Health   Financial Resource Strain: Not on file  Food Insecurity: Not on file  Transportation Needs: Not on file  Physical Activity: Not on file  Stress: Not on file  Social Connections: Not on file   Additional Social History:    Allergies:   Allergies  Allergen Reactions  . Tramadol Swelling  . Nsaids Other (See Comments)    Pancreatitis  . Tolmetin Other (See Comments)    Pancreatitis  . Tylenol [Acetaminophen] Other (See Comments)    unknown  . Aspirin Other (See Comments)    "Makes my pancreas act up"     Labs:  Results for orders placed or performed during the hospital encounter of 03/21/20 (from the past 48 hour(s))  Glucose, capillary     Status: Abnormal   Collection Time: 04/07/20  5:11 PM  Result Value Ref Range   Glucose-Capillary 180 (H) 70 - 99 mg/dL    Comment: Glucose reference range applies only to  samples taken after fasting for at least 8 hours.  Glucose, capillary     Status: Abnormal   Collection Time: 04/07/20  9:27 PM  Result Value Ref Range   Glucose-Capillary 159 (H) 70 - 99 mg/dL    Comment: Glucose reference range applies only to samples taken after fasting for at least 8 hours.   Comment 1 Notify RN    Comment 2 Document in Chart   Glucose, capillary     Status: Abnormal   Collection Time: 04/08/20  7:43 AM  Result Value Ref Range   Glucose-Capillary 175 (H) 70 - 99 mg/dL    Comment: Glucose reference range applies only to samples taken after fasting for  at least 8 hours.  Glucose, capillary     Status: Abnormal   Collection Time: 04/08/20 11:33 AM  Result Value Ref Range   Glucose-Capillary 163 (H) 70 - 99 mg/dL    Comment: Glucose reference range applies only to samples taken after fasting for at least 8 hours.  Glucose, capillary     Status: Abnormal   Collection Time: 04/08/20  5:18 PM  Result Value Ref Range   Glucose-Capillary 190 (H) 70 - 99 mg/dL    Comment: Glucose reference range applies only to samples taken after fasting for at least 8 hours.  Glucose, capillary     Status: Abnormal   Collection Time: 04/08/20 11:21 PM  Result Value Ref Range   Glucose-Capillary 153 (H) 70 - 99 mg/dL    Comment: Glucose reference range applies only to samples taken after fasting for at least 8 hours.  Iron and TIBC     Status: None   Collection Time: 04/09/20  6:15 AM  Result Value Ref Range   Iron 57 28 - 170 ug/dL   TIBC 336 250 - 450 ug/dL   Saturation Ratios 17 10.4 - 31.8 %   UIBC 279 ug/dL    Comment: Performed at [facility], [place] [address] [place], [place] [address]  Ferritin     Status: None   Collection Time: 04/09/20  6:15 AM  Result Value Ref Range   Ferritin 98 11 - 307 ng/mL    Comment: Performed at [facility], [place] [address], [place], [place] [address]  CBC     Status: Abnormal   Collection Time: 04/09/20   6:15 AM  Result Value Ref Range   WBC 7.1 4.0 - 10.5 K/uL   RBC 5.23 (H) 3.87 - 5.11 MIL/uL   Hemoglobin 12.6 12.0 - 15.0 g/dL   HCT 42.8 36.0 - 46.0 %   MCV 81.8 80.0 - 100.0 fL   MCH 24.1 (L) 26.0 - 34.0 pg   MCHC 29.4 (L) 30.0 - 36.0 g/dL   RDW 22.5 (H) 11.5 - 15.5 %   Platelets 356 150 - 400 K/uL   nRBC 2.1 (H) 0.0 - 0.2 %    Comment: Performed at [facility], [place] [address], [place], [place] [address]  Carbamazepine level, total     Status: None   Collection Time: 04/09/20  6:23 AM  Result Value Ref Range   Carbamazepine Lvl 7.0 4.0 - 12.0 ug/mL    Comment: Performed at [facility] [address], [place], [place] [address]  Basic metabolic panel     Status: Abnormal   Collection Time: 04/09/20  7:36 AM  Result Value Ref Range   Sodium 142 135 - 145 mmol/L   Potassium 4.0 3.5 - 5.1 mmol/L   Chloride 102 98 - 111 mmol/L   CO2 27 22 - 32 mmol/L   Glucose, Bld 145 (H) 70 - 99 mg/dL    Comment: Glucose reference range applies only to samples taken after fasting for at least 8 hours.   BUN 23 (H) 6 - 20 mg/dL   Creatinine, Ser 0.96 0.44 - 1.00 mg/dL   Calcium 8.7 (L) 8.9 - 10.3 mg/dL   GFR, Estimated >60 >60 mL/min    Comment: (NOTE) Calculated using the CKD-EPI Creatinine Equation (2021)    Anion gap 13 5 - 15    Comment: Performed at [facility], [place] [address], [place], [place] [address]  Glucose, capillary  Status: Abnormal   Collection Time: 04/09/20  7:54 AM  Result Value Ref Range   Glucose-Capillary 159 (H) 70 - 99 mg/dL    Comment: Glucose reference range applies only to samples taken after fasting for at least 8 hours.  Glucose, capillary     Status: Abnormal   Collection Time: 04/09/20 11:56 AM  Result Value Ref Range   Glucose-Capillary 152 (H) 70 - 99 mg/dL    Comment: Glucose reference range applies only to samples taken after fasting for at least 8 hours.    Current Facility-Administered Medications   Medication Dose Route Frequency Provider Last Rate Last Admin  . acetaminophen (TYLENOL) tablet 650 mg  650 mg Oral Q6H Barb Merino, MD   650 mg at 04/08/20 1740  . albuterol (VENTOLIN HFA) 108 (90 Base) MCG/ACT inhaler 2 puff  2 puff Inhalation Q6H PRN Marylyn Ishihara, Tyrone A, DO      . amitriptyline (ELAVIL) tablet 100 mg  100 mg Oral QHS Pokhrel, Laxman, MD   100 mg at 04/07/20 2202  . atorvastatin (LIPITOR) tablet 20 mg  20 mg Oral Daily Kyle, Tyrone A, DO   20 mg at 04/09/20 1047  . busPIRone (BUSPAR) tablet 10 mg  10 mg Oral TID Suella Broad, FNP   10 mg at 04/09/20 1046  . carbamazepine (TEGRETOL) tablet 400 mg  400 mg Oral BID Suella Broad, FNP   400 mg at 04/09/20 1047  . chlorhexidine (PERIDEX) 0.12 % solution 15 mL  15 mL Mouth Rinse BID Kyle, Tyrone A, DO   15 mL at 04/09/20 1046  . clopidogrel (PLAVIX) tablet 75 mg  75 mg Oral Daily Kyle, Tyrone A, DO   75 mg at 04/09/20 1047  . collagenase (SANTYL) ointment   Topical Daily Patrecia Pour, MD   Given at 04/09/20 1049  . docusate sodium (COLACE) capsule 100 mg  100 mg Oral BID Cristal Generous, NP   100 mg at 04/09/20 1046  . furosemide (LASIX) tablet 40 mg  40 mg Oral Daily Barb Merino, MD   40 mg at 04/09/20 1047  . hydrocortisone cream 1 % 1 application  1 application Topical TID PRN Barb Merino, MD   1 application at 92/33/00 0430  . insulin aspart (novoLOG) injection 0-5 Units  0-5 Units Subcutaneous QHS Patrecia Pour, MD   2 Units at 04/06/20 2241  . insulin aspart (novoLOG) injection 0-6 Units  0-6 Units Subcutaneous TID WC Patrecia Pour, MD   1 Units at 04/09/20 [phone]  . insulin glargine (LANTUS) injection 10 Units  10 Units Subcutaneous QHS Patrecia Pour, MD   10 Units at 04/08/20 2327  . lipase/protease/amylase (CREON) capsule 36,000 Units  36,000 Units Oral TID PRN Minda Ditto, RPH   36,000 Units at 03/30/20 1038  . lipase/protease/amylase (CREON) capsule 72,000 Units  72,000 Units Oral TID WC Minda Ditto, RPH   72,000 Units at 04/09/20 1048  . LORazepam (ATIVAN) injection 0.5 mg  0.5 mg Intravenous Q6H PRN Mariel Aloe, MD   0.5 mg at 04/07/20 1629  . MEDLINE mouth rinse  15 mL Mouth Rinse q12n4p Kyle, Tyrone A, DO   15 mL at 04/07/20 1629  . metoprolol succinate (TOPROL-XL) 24 hr tablet 50 mg  50 mg Oral Daily Kyle, Tyrone A, DO   50 mg at 04/09/20 1046  . mometasone-formoterol (DULERA) 200-5 MCG/ACT inhaler 2 puff  2 puff Inhalation BID Marylyn Ishihara, Tyrone A, DO  2 puff at 04/09/20 0835  . nicotine (NICODERM CQ - dosed in mg/24 hours) patch 14 mg  14 mg Transdermal Daily Barb Merino, MD   14 mg at 04/09/20 1049  . OLANZapine (ZYPREXA) tablet 5 mg  5 mg Oral QHS Barb Merino, MD   5 mg at 04/07/20 2202  . polyethylene glycol (MIRALAX / GLYCOLAX) packet 17 g  17 g Oral Daily Barb Merino, MD   17 g at 04/09/20 1047  . simethicone (MYLICON) chewable tablet 80 mg  80 mg Oral QID PRN Cristal Generous, NP   80 mg at 04/06/20 2221  . sodium chloride flush (NS) 0.9 % injection 10-40 mL  10-40 mL Intracatheter PRN Deno Etienne, DO        Musculoskeletal: Strength & Muscle Tone: within normal limits Gait & Station: normal Patient leans: N/A  Psychiatric Specialty Exam: Physical Exam   Review of Systems   Blood pressure (!) 152/92, pulse 98, temperature 98.1 F (36.7 C), temperature source Oral, resp. rate (!) 23, height 5\' 6"  (1.676 m), weight 88.6 kg, SpO2 94 %.Body mass index is 31.53 kg/m.  General Appearance: Casual  Eye Contact:  Fair  Speech:  Clear and Coherent and Normal Rate  Volume:  Normal  Mood:  Anxious  Affect:  Constricted  Thought Process:  Coherent, Linear and Descriptions of Associations: Intact  Orientation:  Other:  person and palce  Thought Content:  Logical and Rumination  Suicidal Thoughts:  No  Homicidal Thoughts:  No  Memory:  Immediate;   Poor Recent;   Poor Remote;   Poor  Judgement:  Poor  Insight:  Shallow  Psychomotor Activity:  Normal   Concentration:  Concentration: Poor and Attention Span: Poor  Recall:  Poor  Fund of Knowledge:  Poor  Language:  Fair  Akathisia:  No  Handed:  Right  AIMS (if indicated):     Assets:  Communication Skills Desire for Improvement Financial Resources/Insurance Housing Leisure Time Social Support  ADL's:  Impaired  Cognition:  Impaired,  Mild  Sleep:      Morgan Beasley is a 59 year old female with significant medical history to include cerebral edema, bipolar, polysubstance abuse, diabetes, cardiovascular disease who presents with altered mental status.  Patient is seen and observed to be sitting up in bed, and was assisted to the bedside table with a 2 person assist. Patient is not exhibiting any behaviors of disruption, agitation, aggression, and or combativeness. She is currently taking amitriptyline 25 mg p.o. nightly, buspirone 10 mg p.o. twice daily, Tegretol 400 mg p.o. twice daily, olanzapine 5 mg p.o. nightly.  Patient denies any suicidality, homicidality, and or auditory visual hallucinations.    Treatment Plan Summary: Plan Continue current medications to include Tegretol 400mg  po BID for aggression, agitation, and confusion. Patient carbamazepine level is increasing, although not at goal. She shows modest improvement on this dose of medication. Will continue olanzapin 5mg  po qhs. Per family rquest would like to reduce any additional dose or excessive usage of prn medications, will discontinue any additional meds at this time. They suspect the Ativan is making her restless and confused at night. Will psych clear patient at this time.     - Her symptoms have improved since her admission although we have not obtain complete cessation of her neurobehavioral symptoms.  Will recommend we continue delirium precautions, safety sitter, and limit use of restraints as they can worsen agitation.   Disposition: No evidence of imminent risk to self or  others at present.   Patient does  not meet criteria for psychiatric inpatient admission.  Suella Broad, FNP 04/09/2020 12:22 PM

## 2020-04-09 NOTE — Progress Notes (Signed)
PROGRESS NOTE    Morgan Beasley  VQM:[id number] DOB: 12-16-1960 DOA: 03/21/2020 PCP: Riki Sheer, NP   Brief Narrative: Morgan Beasley is a 59 y.o. female patient of depression, bipolar disorder, substance abuse, pulmonary valve endocarditis, multiple CVAs.  Patient presented secondary to agitation, hallucinations, restlessness.  Patient was initially managed on Precedex drip in the ICU.  She found to have large pleural effusions with pulmonary edema as well.  Patient started on antipsychotics therapy with improvement of symptoms but still requiring inpatient [facility] health per psychiatry recommendations.   Assessment & Plan:   Principal Problem:   Acute metabolic encephalopathy Active Problems:   Peripheral neuropathy   COPD (chronic obstructive pulmonary disease) (HCC)   Diabetic peripheral neuropathy associated with type 2 diabetes mellitus ([place])   Cerebral embolism with cerebral infarction   Mixed hyperlipidemia   Psychosis, delirium, bipolar disorder Patient has been evaluated by psychiatry who recommended inpatient behavioral health admission. Patient's medications have been adjusted while inpatient and you she is currently managed on Elavil, BuSpar, Tegretol, Zyprexa. Plan is for inpatient Geri psych admission when bed is available or when cleared for discharge by inpatient psychiatry. -Continue psychiatry evaluation/recommendations while awaiting inpatient Geri psych admission; will ask for evaluation today in addition to family communication -Continue Elavil, BuSpar, Tegretol, Zyprexa  Acute respiratory failure with hypoxia In setting of pulmonary edema from acute CHF in addition to likely contributing OSA and COPD. Patient weaned to room air.  Acute systolic heart failure Recent echo from November 2021 significant for an EF of 40% with associated moderately reduced right ventricular systolic function with left ventricular enlargement. Patient has been  diuresed with IV Lasix. Currently euvolemic. -Continue Lasix 40 mg daily -Continue metoprolol succinate 50 mg daily  Possible obstructive sleep apnea Patient noted to have intermittent apneic spells while sleeping. Recommendation for outpatient sleep study.  History of CVA Dysarthria. Right/left occipital infarcts in addition to left basal ganglia/thalamic microhemmorahages -Continue Plavix and Lipitor  Diabetes mellitus, type II -Continue Lantus and sliding scale insulin  Chronic pancreatitis -Continue Creon  Acute kidney injury on CKD stage IIIa Currently stable  Anemia of chronic disease Currently stable. -Iron panel in AM  Right supraclavicular/mediastinal adenopathy With patient's history of breast cancer treated with mastectomy, recommendation for outpatient oncology follow-up  Decreased pedal pulses Multiple LE wounds Wounds are healed. Concern for arterial insufficiency. Patient follows with vascular surgery and has a diagnosis of PVD. No current plans for intervention.  Recent serratia bacteremia Previously on Cefepime which was discontinued shortly after admission. No evidence of infection currently.  Right leg pain Patient with history of PVD but has pain over right tibia. History of fall prior to admission. -X-ray of right tib/fib   DVT prophylaxis: SCDs Code Status:   Code Status: Full Code Family Communication: None at bedside. Called daughter; voice mail. Disposition Plan: Discharge to [facility] psych once bed is available   Consultants:   PCCM  ID  Psychiatry  Procedures:   None  Antimicrobials:  None   Subjective: Patient aggressive overnight requiring mittens. Daughter is concerned about patient's disposition currently. She does not want her to be sent away to another hospital and thinks she is just on too many medications. She believes that if she was off the medication, she would be better. She is also concerned that medications cause her  mother to have her most recent stroke.  Objective: Vitals:   04/08/20 1135 04/09/20 0544 04/09/20 0700 04/09/20 0836  BP: (!) 167/81 Marland Kitchen)  165/104 (!) 152/92   Pulse: 97 98    Resp: (!) 21 (!) 23    Temp: 97.6 F (36.4 C) 98.1 F (36.7 C)    TempSrc:  Oral    SpO2: 98% 94%  94%  Weight:  88.6 kg    Height:       No intake or output data in the 24 hours ending 04/09/20 0946 Filed Weights   04/05/20 0523 04/07/20 0619 04/09/20 0544  Weight: 89.7 kg 88.3 kg 88.6 kg    Examination:  General exam: Appears calm and comfortable Respiratory system: Clear to auscultation. Respiratory effort normal. Cardiovascular system: S1 & S2 heard, RRR. No murmurs, rubs, gallops or clicks. Gastrointestinal system: Abdomen is nondistended, soft and nontender. There is some significant edema of her right side. Normal bowel sounds heard. Central nervous system: Alert and oriented to person. Dysarthria Musculoskeletal: No edema. No calf tenderness. Tenderness over right anterior tibia Skin: No cyanosis. No rashes. Multiple chronic wounds on LE bilaterally Psychiatry: Flat affect  Data Reviewed: I have personally reviewed following labs and imaging studies  CBC Lab Results  Component Value Date   WBC 7.1 04/09/2020   RBC 5.23 (H) 04/09/2020   HGB 12.6 04/09/2020   HCT 42.8 04/09/2020   MCV 81.8 04/09/2020   MCH 24.1 (L) 04/09/2020   PLT 356 04/09/2020   MCHC 29.4 (L) 04/09/2020   RDW 22.5 (H) 04/09/2020   LYMPHSABS 1.9 03/25/2020   MONOABS 1.0 03/25/2020   EOSABS 0.1 03/25/2020   BASOSABS 0.0 62/26/3335     Last metabolic panel Lab Results  Component Value Date   NA 142 04/09/2020   K 4.0 04/09/2020   CL 102 04/09/2020   CO2 27 04/09/2020   BUN 23 (H) 04/09/2020   CREATININE 0.96 04/09/2020   GLUCOSE 145 (H) 04/09/2020   GFRNONAA >60 04/09/2020   GFRAA 65 02/09/2020   CALCIUM 8.7 (L) 04/09/2020   PHOS 3.3 04/07/2020   PROT 7.1 04/07/2020   ALBUMIN 2.5 (L) 04/07/2020   BILITOT  0.8 04/07/2020   ALKPHOS 227 (H) 04/07/2020   AST 31 04/07/2020   ALT 24 04/07/2020   ANIONGAP 13 04/09/2020    CBG (last 3)  Recent Labs    04/08/20 1718 04/08/20 2321 04/09/20 0754  GLUCAP 190* 153* 159*     GFR: Estimated Creatinine Clearance: 71.6 mL/min (by C-G formula based on SCr of 0.96 mg/dL).  Coagulation Profile: No results for input(s): INR, PROTIME in the last 168 hours.  No results found for this or any previous visit (from the past 240 hour(s)).      Radiology Studies: No results found.      Scheduled Meds: . acetaminophen  650 mg Oral Q6H  . amitriptyline  100 mg Oral QHS  . atorvastatin  20 mg Oral Daily  . busPIRone  10 mg Oral TID  . carbamazepine  400 mg Oral BID  . chlorhexidine  15 mL Mouth Rinse BID  . clopidogrel  75 mg Oral Daily  . collagenase   Topical Daily  . docusate sodium  100 mg Oral BID  . furosemide  40 mg Oral Daily  . insulin aspart  0-5 Units Subcutaneous QHS  . insulin aspart  0-6 Units Subcutaneous TID WC  . insulin glargine  10 Units Subcutaneous QHS  . lipase/protease/amylase  72,000 Units Oral TID WC  . mouth rinse  15 mL Mouth Rinse q12n4p  . metoprolol succinate  50 mg Oral Daily  . mometasone-formoterol  2 puff Inhalation BID  . nicotine  14 mg Transdermal Daily  . OLANZapine  5 mg Oral QHS  . polyethylene glycol  17 g Oral Daily   Continuous Infusions:   LOS: 19 days     Cordelia Poche, MD [facility] 04/09/2020, 9:46 AM  If 7PM-7AM, please contact night-coverage [email or web address]

## 2020-04-10 ENCOUNTER — Inpatient Hospital Stay (HOSPITAL_COMMUNITY): Payer: Medicare Other

## 2020-04-10 ENCOUNTER — Encounter (HOSPITAL_COMMUNITY): Payer: Self-pay | Admitting: Internal Medicine

## 2020-04-10 LAB — GLUCOSE, CAPILLARY
Glucose-Capillary: 81 mg/dL (ref 70–99)
Glucose-Capillary: 81 mg/dL (ref 70–99)
Glucose-Capillary: 124 mg/dL — ABNORMAL HIGH (ref 70–99)
Glucose-Capillary: 95 mg/dL (ref 70–99)

## 2020-04-10 NOTE — Progress Notes (Addendum)
PROGRESS NOTE    Morgan Beasley  WHQ:[id number] DOB: 09/20/1960 DOA: 03/21/2020 PCP: Riki Sheer, NP   Brief Narrative: Morgan Beasley is a 59 y.o. female patient of depression, bipolar disorder, substance abuse, pulmonary valve endocarditis, multiple CVAs.  Patient presented secondary to agitation, hallucinations, restlessness.  Patient was initially managed on Precedex drip in the ICU.  She found to have large pleural effusions with pulmonary edema as well.  Patient started on antipsychotics therapy with improvement of symptoms but still requiring inpatient [facility] health per psychiatry recommendations.   Assessment & Plan:   Principal Problem:   Acute metabolic encephalopathy Active Problems:   Peripheral neuropathy   COPD (chronic obstructive pulmonary disease) (HCC)   Diabetic peripheral neuropathy associated with type 2 diabetes mellitus ([place])   Cerebral embolism with cerebral infarction   Mixed hyperlipidemia   Psychosis, delirium, bipolar disorder Patient has been evaluated by psychiatry who recommended inpatient behavioral health admission. Patient's medications have been adjusted while inpatient and you she is currently managed on Elavil, BuSpar, Tegretol, Zyprexa. Plan is for inpatient Geri psych admission when bed is available or when cleared for discharge by inpatient psychiatry. -Psychiatry recommendations: can discharge to SNF with recommendation for continued sitter support until agitation is better controlled while inpatient -Continue Elavil, BuSpar, Tegretol, Zyprexa  Acute respiratory failure with hypoxia In setting of pulmonary edema from acute CHF in addition to likely contributing OSA and COPD. Patient weaned to room air.  Acute systolic heart failure Recent echo from November 2021 significant for an EF of 40% with associated moderately reduced right ventricular systolic function with left ventricular enlargement. Patient has been diuresed  with IV Lasix. Currently euvolemic. -Continue Lasix 40 mg daily -Continue metoprolol succinate 50 mg daily  Possible obstructive sleep apnea Patient noted to have intermittent apneic spells while sleeping. Recommendation for outpatient sleep study.  History of CVA Dysarthria. Right/left occipital infarcts in addition to left basal ganglia/thalamic microhemmorahages -Continue Plavix and Lipitor  Diabetes mellitus, type II -Continue Lantus and sliding scale insulin  Chronic pancreatitis -Continue Creon  Acute kidney injury on CKD stage IIIa Currently stable  Anemia of chronic disease Currently stable. Iron panel is unremarkable.  Right supraclavicular/mediastinal adenopathy With patient's history of breast cancer treated with mastectomy, recommendation for outpatient oncology follow-up  Decreased pedal pulses Multiple LE wounds Wounds are healed. Concern for arterial insufficiency. Patient follows with vascular surgery and has a diagnosis of PVD. No current plans for intervention.  Recent serratia bacteremia Previously on Cefepime which was discontinued shortly after admission. No evidence of infection currently.  Right leg pain Patient with history of PVD but has pain over right tibia. History of fall prior to admission. -X-ray of right tib/fib   DVT prophylaxis: SCDs Code Status:   Code Status: Full Code Family Communication: None at bedside Disposition Plan: Discharge to SNF when bed available/insurance authorized   Consultants:   PCCM  ID  Psychiatry  Procedures:   None  Antimicrobials:  None   Subjective: No issues overnight.  Objective: Vitals:   04/09/20 2202 04/10/20 0419 04/10/20 0846 04/10/20 1301  BP:  132/83  (!) 197/100  Pulse:  88  (!) 102  Resp: 19 19  19   Temp:  97.6 F (36.4 C)  97.7 F (36.5 C)  TempSrc:  Oral  Oral  SpO2:  96% 95% 97%  Weight:  89.4 kg    Height:        Intake/Output Summary (Last 24 hours) at 04/10/2020  1335 Last data filed at 04/10/2020 1019 Gross per 24 hour  Intake 110 ml  Output --  Net 110 ml   Filed Weights   04/07/20 0619 04/09/20 0544 04/10/20 0419  Weight: 88.3 kg 88.6 kg 89.4 kg    Examination:  General exam: Appears calm and comfortable Respiratory system: Clear to auscultation. Respiratory effort normal. Cardiovascular system: S1 & S2 heard, RRR. No murmurs, rubs, gallops or clicks. Gastrointestinal system: Abdomen is nondistended, soft and nontender. No organomegaly or masses felt. Normal bowel sounds heard. Central nervous system: Somnolent but easily arouses. Musculoskeletal: No edema. No calf tenderness. Right anterior leg pain Skin: No cyanosis. No rashes  Data Reviewed: I have personally reviewed following labs and imaging studies  CBC Lab Results  Component Value Date   WBC 7.1 04/09/2020   RBC 5.23 (H) 04/09/2020   HGB 12.6 04/09/2020   HCT 42.8 04/09/2020   MCV 81.8 04/09/2020   MCH 24.1 (L) 04/09/2020   PLT 356 04/09/2020   MCHC 29.4 (L) 04/09/2020   RDW 22.5 (H) 04/09/2020   LYMPHSABS 1.9 03/25/2020   MONOABS 1.0 03/25/2020   EOSABS 0.1 03/25/2020   BASOSABS 0.0 40/98/1191     Last metabolic panel Lab Results  Component Value Date   NA 142 04/09/2020   K 4.0 04/09/2020   CL 102 04/09/2020   CO2 27 04/09/2020   BUN 23 (H) 04/09/2020   CREATININE 0.96 04/09/2020   GLUCOSE 145 (H) 04/09/2020   GFRNONAA >60 04/09/2020   GFRAA 65 02/09/2020   CALCIUM 8.7 (L) 04/09/2020   PHOS 3.3 04/07/2020   PROT 7.1 04/07/2020   ALBUMIN 2.5 (L) 04/07/2020   BILITOT 0.8 04/07/2020   ALKPHOS 227 (H) 04/07/2020   AST 31 04/07/2020   ALT 24 04/07/2020   ANIONGAP 13 04/09/2020    CBG (last 3)  Recent Labs    04/09/20 2023 04/10/20 0723 04/10/20 1154  GLUCAP 153* 81 81     GFR: Estimated Creatinine Clearance: 71.9 mL/min (by C-G formula based on SCr of 0.96 mg/dL).  Coagulation Profile: No results for input(s): INR, PROTIME in the last 168  hours.  No results found for this or any previous visit (from the past 240 hour(s)).      Radiology Studies: DG Tibia/Fibula Right Port  Result Date: 04/10/2020 CLINICAL DATA:  Right tibial pain. EXAM: PORTABLE RIGHT TIBIA AND FIBULA - 2 VIEW COMPARISON:  None. FINDINGS: There is no evidence of fracture or other focal bone lesions. Soft tissues are unremarkable. IMPRESSION: Negative. Electronically Signed   By: Marijo Conception M.D.   On: 04/10/2020 12:28        Scheduled Meds: . acetaminophen  650 mg Oral Q6H  . amitriptyline  100 mg Oral QHS  . atorvastatin  20 mg Oral Daily  . busPIRone  10 mg Oral TID  . carbamazepine  400 mg Oral BID  . chlorhexidine  15 mL Mouth Rinse BID  . clopidogrel  75 mg Oral Daily  . collagenase   Topical Daily  . docusate sodium  100 mg Oral BID  . furosemide  40 mg Oral Daily  . insulin aspart  0-5 Units Subcutaneous QHS  . insulin aspart  0-6 Units Subcutaneous TID WC  . insulin glargine  10 Units Subcutaneous QHS  . lipase/protease/amylase  72,000 Units Oral TID WC  . mouth rinse  15 mL Mouth Rinse q12n4p  . metoprolol succinate  50 mg Oral Daily  . mometasone-formoterol  2 puff Inhalation  BID  . nicotine  14 mg Transdermal Daily  . OLANZapine  5 mg Oral QHS  . polyethylene glycol  17 g Oral Daily   Continuous Infusions:   LOS: 20 days     Cordelia Poche, MD [facility] 04/10/2020, 1:35 PM  If 7PM-7AM, please contact night-coverage [email or web address]

## 2020-04-11 LAB — GLUCOSE, CAPILLARY
Glucose-Capillary: 111 mg/dL — ABNORMAL HIGH (ref 70–99)
Glucose-Capillary: 118 mg/dL — ABNORMAL HIGH (ref 70–99)
Glucose-Capillary: 158 mg/dL — ABNORMAL HIGH (ref 70–99)
Glucose-Capillary: 203 mg/dL — ABNORMAL HIGH (ref 70–99)

## 2020-04-11 MED ORDER — HALOPERIDOL LACTATE 5 MG/ML IJ SOLN: 2.0000 mg | Freq: Once | INTRAMUSCULAR | Status: DC

## 2020-04-11 NOTE — Progress Notes (Signed)
PROGRESS NOTE    Morgan Beasley  TIW:[id number] DOB: January 16, 1961 DOA: 03/21/2020 PCP: Riki Sheer, NP   Brief Narrative: Morgan Beasley is a 59 y.o. female patient of depression, bipolar disorder, substance abuse, pulmonary valve endocarditis, multiple CVAs.  Patient presented secondary to agitation, hallucinations, restlessness.  Patient was initially managed on Precedex drip in the ICU.  She found to have large pleural effusions with pulmonary edema as well.  Patient started on antipsychotics therapy with improvement of symptoms but still requiring inpatient [facility] health per psychiatry recommendations.   Assessment & Plan:   Principal Problem:   Acute metabolic encephalopathy Active Problems:   Peripheral neuropathy   COPD (chronic obstructive pulmonary disease) (HCC)   Diabetic peripheral neuropathy associated with type 2 diabetes mellitus ([place])   Cerebral embolism with cerebral infarction   Mixed hyperlipidemia   Psychosis, delirium, bipolar disorder Patient has been evaluated by psychiatry who recommended inpatient behavioral health admission. Patient's medications have been adjusted while inpatient and you she is currently managed on Elavil, BuSpar, Tegretol, Zyprexa. Plan is for inpatient Geri psych admission when bed is available or when cleared for discharge by inpatient psychiatry. Patient is still very agitated requiring continued sitter support. -Psychiatry recommendations: can discharge to SNF with recommendation for continued sitter support until agitation is better controlled while inpatient -Continue Elavil, BuSpar, Tegretol, Zyprexa  Acute respiratory failure with hypoxia In setting of pulmonary edema from acute CHF in addition to likely contributing OSA and COPD. Patient weaned to room air.  Acute systolic heart failure Recent echo from November 2021 significant for an EF of 40% with associated moderately reduced right ventricular systolic  function with left ventricular enlargement. Patient has been diuresed with IV Lasix. Currently euvolemic. -Continue Lasix 40 mg daily -Continue metoprolol succinate 50 mg daily  Possible obstructive sleep apnea Patient noted to have intermittent apneic spells while sleeping. Recommendation for outpatient sleep study.  History of CVA Dysarthria. Right/left occipital infarcts in addition to left basal ganglia/thalamic microhemmorahages -Continue Plavix and Lipitor  Diabetes mellitus, type II -Continue Lantus and sliding scale insulin  Chronic pancreatitis -Continue Creon  Acute kidney injury on CKD stage IIIa Currently stable  Anemia of chronic disease Currently stable. Iron panel is unremarkable.  Right supraclavicular/mediastinal adenopathy With patient's history of breast cancer treated with mastectomy, recommendation for outpatient oncology follow-up  Decreased pedal pulses Multiple LE wounds Wounds are healed. Concern for arterial insufficiency. Patient follows with vascular surgery and has a diagnosis of PVD. No current plans for intervention.  Recent serratia bacteremia Previously on Cefepime which was discontinued shortly after admission. No evidence of infection currently.  Right leg pain Patient with history of PVD but has pain over right tibia. History of fall prior to admission. X-ray of tib/fib (12/11) negative for acute fracture. -X-ray of right tib/fib   DVT prophylaxis: SCDs Code Status:   Code Status: Full Code Family Communication: None at bedside Disposition Plan: Discharge to SNF when bed available/insurance authorized   Consultants:   PCCM  ID  Psychiatry  Procedures:   None  Antimicrobials:  None   Subjective: Per nurse discussion, patient was very agitated overnight. Not adherent. Difficult to re-direct. Sitter required overnight.  Objective: Vitals:   04/10/20 1301 04/10/20 2126 04/11/20 0511 04/11/20 0804  BP: (!) 197/100 (!)  155/84 130/71   Pulse: (!) 102 97 95   Resp: 19 (!) 24 (!) 22   Temp: 97.7 F (36.5 C) 98.2 F (36.8 C) 98.1 F (36.7 C)  TempSrc: Oral Oral Oral   SpO2: 97% 98% 98% 98%  Weight:      Height:        Intake/Output Summary (Last 24 hours) at 04/11/2020 1126 Last data filed at 04/11/2020 0758 Gross per 24 hour  Intake 620 ml  Output 700 ml  Net -80 ml   Filed Weights   04/07/20 0619 04/09/20 0544 04/10/20 0419  Weight: 88.3 kg 88.6 kg 89.4 kg    Examination:  General exam: Agitated. Screaming out for her daughters as NT attempts to wash her after episode of incontence Respiratory system: Respiratory effort normal. Central nervous system: Alert and disoriented. Musculoskeletal: No edema. No calf tenderness Skin: No cyanosis. No rashes Psychiatry: Judgement and insight appear impaired. Agitated mood/affect.  Data Reviewed: I have personally reviewed following labs and imaging studies  CBC Lab Results  Component Value Date   WBC 7.1 04/09/2020   RBC 5.23 (H) 04/09/2020   HGB 12.6 04/09/2020   HCT 42.8 04/09/2020   MCV 81.8 04/09/2020   MCH 24.1 (L) 04/09/2020   PLT 356 04/09/2020   MCHC 29.4 (L) 04/09/2020   RDW 22.5 (H) 04/09/2020   LYMPHSABS 1.9 03/25/2020   MONOABS 1.0 03/25/2020   EOSABS 0.1 03/25/2020   BASOSABS 0.0 42/70/6237     Last metabolic panel Lab Results  Component Value Date   NA 142 04/09/2020   K 4.0 04/09/2020   CL 102 04/09/2020   CO2 27 04/09/2020   BUN 23 (H) 04/09/2020   CREATININE 0.96 04/09/2020   GLUCOSE 145 (H) 04/09/2020   GFRNONAA >60 04/09/2020   GFRAA 65 02/09/2020   CALCIUM 8.7 (L) 04/09/2020   PHOS 3.3 04/07/2020   PROT 7.1 04/07/2020   ALBUMIN 2.5 (L) 04/07/2020   BILITOT 0.8 04/07/2020   ALKPHOS 227 (H) 04/07/2020   AST 31 04/07/2020   ALT 24 04/07/2020   ANIONGAP 13 04/09/2020    CBG (last 3)  Recent Labs    04/10/20 1733 04/10/20 2115 04/11/20 0751  GLUCAP 124* 95 111*     GFR: Estimated Creatinine  Clearance: 71.9 mL/min (by C-G formula based on SCr of 0.96 mg/dL).  Coagulation Profile: No results for input(s): INR, PROTIME in the last 168 hours.  No results found for this or any previous visit (from the past 240 hour(s)).      Radiology Studies: DG Tibia/Fibula Right Port  Result Date: 04/10/2020 CLINICAL DATA:  Right tibial pain. EXAM: PORTABLE RIGHT TIBIA AND FIBULA - 2 VIEW COMPARISON:  None. FINDINGS: There is no evidence of fracture or other focal bone lesions. Soft tissues are unremarkable. IMPRESSION: Negative. Electronically Signed   By: Marijo Conception M.D.   On: 04/10/2020 12:28        Scheduled Meds: . acetaminophen  650 mg Oral Q6H  . amitriptyline  100 mg Oral QHS  . atorvastatin  20 mg Oral Daily  . busPIRone  10 mg Oral TID  . carbamazepine  400 mg Oral BID  . chlorhexidine  15 mL Mouth Rinse BID  . clopidogrel  75 mg Oral Daily  . collagenase   Topical Daily  . docusate sodium  100 mg Oral BID  . furosemide  40 mg Oral Daily  . haloperidol lactate  2 mg Intramuscular Once  . insulin aspart  0-5 Units Subcutaneous QHS  . insulin aspart  0-6 Units Subcutaneous TID WC  . insulin glargine  10 Units Subcutaneous QHS  . lipase/protease/amylase  72,000 Units Oral TID WC  .  mouth rinse  15 mL Mouth Rinse q12n4p  . metoprolol succinate  50 mg Oral Daily  . mometasone-formoterol  2 puff Inhalation BID  . nicotine  14 mg Transdermal Daily  . OLANZapine  5 mg Oral QHS  . polyethylene glycol  17 g Oral Daily   Continuous Infusions:   LOS: 21 days     Cordelia Poche, MD [facility] 04/11/2020, 11:26 AM  If 7PM-7AM, please contact night-coverage [email or web address]

## 2020-04-11 NOTE — Plan of Care (Signed)
  Problem: Safety: Goal: Ability to remain free from injury will improve Outcome: Progressing   

## 2020-04-11 NOTE — Progress Notes (Signed)
Pt grabbing/combative towards nurse tech. Pt urinated in bed and on the floor. Pt standing and states she is dizzy. Refusing to lay down and to be cleaned up. Pt being loud and yelling.

## 2020-04-11 NOTE — Progress Notes (Signed)
Pt restless, agitated, and noncompliant. Pt kicked Nurse tech in the throat. Refused medications with several attempts. Pt threw medications across the room.

## 2020-04-11 NOTE — TOC Progression Note (Signed)
Transition of Care [facility]) - Progression Note    Patient Details  Name: Morgan Beasley MRN: [id number] Date of Birth: 1960/09/06  Transition of Care [facility]) CM/SW [place], Rose Phone Number: [phone] 04/11/2020, 1:35 PM  Clinical Narrative:     CSW spoke with the patient's [name] (Daughter) [phone]. The patient does not have a husband.  Her daughter was the one who placed the IVC.  [name] said if after the peer-to-peer the patient does not go to [facility], [name] stated the patient can come to her house with home health.    Expected Discharge Plan: Skilled Nursing Facility Barriers to Discharge: Insurance Authorization  Expected Discharge Plan and Services Expected Discharge Plan: [place]   Discharge Planning Services: CM Consult Post Acute Care Choice: [place] Living arrangements for the past 2 months: [place] Expected Discharge Date:  (unknown)                                     Social Determinants of Health (SDOH) Interventions    Readmission Risk Interventions Readmission Risk Prevention Plan 03/18/2020 10/21/2019  Transportation Screening Complete Complete  Medication Review Press photographer) - Complete  PCP or Specialist appointment within 3-5 days of discharge Not Complete Complete  PCP/Specialist Appt Not Complete comments First available appt 12/1 -  [place] or Home Care Consult Complete Complete  SW Recovery Care/Counseling Consult Complete Complete  Palliative Care Screening Not Applicable Not [place] Not Applicable Not Applicable  Some recent data might be hidden

## 2020-04-12 LAB — GLUCOSE, CAPILLARY
Glucose-Capillary: 163 mg/dL — ABNORMAL HIGH (ref 70–99)
Glucose-Capillary: 187 mg/dL — ABNORMAL HIGH (ref 70–99)
Glucose-Capillary: 211 mg/dL — ABNORMAL HIGH (ref 70–99)

## 2020-04-12 MED ORDER — LORAZEPAM 2 MG/ML IJ SOLN: 1.0000 mg | Freq: Once | INTRAMUSCULAR | Status: AC

## 2020-04-12 MED ORDER — LORAZEPAM 2 MG/ML IJ SOLN
INTRAMUSCULAR | Status: AC
  Administered 2020-04-12: 1 mg via INTRAVENOUS
  Filled 2020-04-12: qty 1

## 2020-04-12 NOTE — TOC Progression Note (Signed)
Transition of Care [facility]) - Progression Note    Patient Details  Name: Catlynn Grondahl MRN: [id number] Date of Birth: December 13, 1960  Transition [facility] Care [facility]) CM/SW Contact  Jace Dowe, [name], RN Phone Number: 04/12/2020, 1:29 PM  Clinical Narrative: Awaiting MD/Psych to assess if stable for d/c to [place] has a bed-the. IVC ends 12/15  MD has completed p2p-have auth for SNF/[facility]-must be 24hr sitter free prior d/c. Dtr [name] agree to [facility] if approved;also agree to [facility] if [facility] agency able to accept-HHPT if recc.Patient can stay w/[email or web address] [address] [place]     Expected Discharge Plan: Skilled Nursing Facility Barriers to Discharge: Insurance Authorization  Expected Discharge Plan and Services Expected Discharge Plan: [place]   Discharge Planning Services: CM Consult Post Acute Care Choice: [place] Living arrangements for the past 2 months: [place] Expected Discharge Date:  (unknown)                                     Social Determinants of Health (SDOH) Interventions    Readmission Risk Interventions Readmission Risk Prevention Plan 03/18/2020 10/21/2019  Transportation Screening Complete Complete  Medication Review Press photographer) - Complete  PCP or Specialist appointment within 3-5 days of discharge Not Complete Complete  PCP/Specialist Appt Not Complete comments First available appt 12/1 -  [place] or Home Care Consult Complete Complete  SW Recovery Care/Counseling Consult Complete Complete  Palliative Care Screening Not Applicable Not [place] Not Applicable Not Applicable  Some recent data might be hidden

## 2020-04-12 NOTE — Progress Notes (Signed)
PT Cancellation Note  Patient Details Name: Morgan Beasley MRN: [id number] DOB: 23-Jan-1961   Cancelled Treatment:     room dark, pt sleeping, safety sitter at bed side.  Not ideal to see pt at this time.  Will attempt to later if schedule permits   Zayla, Agar 04/12/2020, 2:29 PM

## 2020-04-12 NOTE — Progress Notes (Signed)
PROGRESS NOTE    Morgan Beasley  YQM:[id number] DOB: [birth year] DOA: 03/21/2020 PCP: Riki Sheer, NP   Brief Narrative: Morgan Beasley is a 59 y.o. female patient of depression, bipolar disorder, substance abuse, pulmonary valve endocarditis, multiple CVAs.  Patient presented secondary to agitation, hallucinations, restlessness.  Patient was initially managed on Precedex drip in the ICU.  She found to have large pleural effusions with pulmonary edema as well.  Patient started on antipsychotics therapy with improvement of symptoms but still requiring inpatient [facility] health per psychiatry recommendations.   Assessment & Plan:   Principal Problem:   Acute metabolic encephalopathy Active Problems:   Peripheral neuropathy   COPD (chronic obstructive pulmonary disease) (HCC)   Diabetic peripheral neuropathy associated with type 2 diabetes mellitus ([place])   Cerebral embolism with cerebral infarction   Mixed hyperlipidemia   Psychosis, delirium, bipolar disorder Patient has been evaluated by psychiatry who recommended inpatient behavioral health admission. Patient's medications have been adjusted while inpatient and you she is currently managed on Elavil, BuSpar, Tegretol, Zyprexa. Plan is for inpatient Ger[facility] psych admission when bed is available or when cleared for discharge by inpatient psychiatry. Patient is still very agitated requiring continued sitter support. -Psychiatry recommendations: can discharge to SNF with recommendation for continued sitter support until agitation is better controlled while inpatient -Continue Elavil, BuSpar, Tegretol, Zyprexa  Acute respiratory failure with hypoxia In setting of pulmonary edema from acute CHF in addition to likely contributing OSA and COPD. Patient weaned to room air.  Acute systolic heart failure Recent echo from November 2021 significant for an EF of 40% with associated moderately reduced right ventricular systolic  function with left ventricular enlargement. Patient has been diuresed with IV Lasix. Currently euvolemic. -Continue Lasix 40 mg daily -Continue metoprolol succinate 50 mg daily  Possible obstructive sleep apnea Patient noted to have intermittent apneic spells while sleeping. Recommendation for outpatient sleep study.  History of CVA Dysarthria. Right/left occipital infarcts in addition to left basal ganglia/thalamic microhemmorahages -Continue Plavix and Lipitor  Diabetes mellitus, type II -Continue Lantus and sliding scale insulin  Chronic pancreatitis -Continue Creon  Acute kidney injury on CKD stage IIIa Currently stable  Anemia of chronic disease Currently stable. Iron panel is unremarkable.  Right supraclavicular/mediastinal adenopathy With patient's history of breast cancer treated with mastectomy, recommendation for outpatient oncology follow-up  Decreased pedal pulses Multiple LE wounds Wounds are healed. Concern for arterial insufficiency. Patient follows with vascular surgery and has a diagnosis of PVD. No current plans for intervention.  Recent serratia bacteremia Previously on Cefepime which was discontinued shortly after admission. No evidence of infection currently.  Right leg pain Patient with history of PVD but has pain over right tibia. History of fall prior to admission. X-ray of tib/fib (12/11) negative for acute fracture.   DVT prophylaxis: SCDs Code Status:   Code Status: Full Code Family Communication: Daughter at bedside and sister on telephone Disposition Plan: Discharge to SNF when bed available/insurance authorized in addition to not needing sitter for safety/behavior   Consultants:   PCCM  ID  Psychiatry  Procedures:   None  Antimicrobials:  None   Subjective: Patient still has issues with agitation but it appears somewhat improved from over this weekend.  Objective: Vitals:   04/12/20 0731 04/12/20 1008 04/12/20 1355  04/12/20 1552  BP:  (!) 143/95 137/68 (!) 133/57  Pulse:  (!) 109 (!) 113 91  Resp:   (!) 22 18  Temp:   97.9 F (36.6  C) 97.8 F (36.6 C)  TempSrc:      SpO2: (!) 85%  100% 100%  Weight:      Height:        Intake/Output Summary (Last 24 hours) at 04/12/2020 1705 Last data filed at 04/12/2020 1430 Gross per 24 hour  Intake 490 ml  Output --  Net 490 ml   Filed Weights   04/09/20 0544 04/10/20 0419 04/12/20 0500  Weight: 88.6 kg 89.4 kg 90.2 kg    Examination:  General exam: Appears calm and comfortable Respiratory system: Clear to auscultation. Respiratory effort normal. Cardiovascular system: S1 & S2 heard, RRR. No murmurs, rubs, gallops or clicks. Gastrointestinal system: Abdomen is nondistended, soft and nontender. No organomegaly or masses felt. Normal bowel sounds heard. Central nervous system: Somnolent but easily arouses.  Musculoskeletal: No edema. No calf tenderness Skin: No cyanosis. No rashes  Data Reviewed: I have personally reviewed following labs and imaging studies  CBC Lab Results  Component Value Date   WBC 7.1 04/09/2020   RBC 5.23 (H) 04/09/2020   HGB 12.6 04/09/2020   HCT 42.8 04/09/2020   MCV 81.8 04/09/2020   MCH 24.1 (L) 04/09/2020   PLT 356 04/09/2020   MCHC 29.4 (L) 04/09/2020   RDW 22.5 (H) 04/09/2020   LYMPHSABS 1.9 03/25/2020   MONOABS 1.0 03/25/2020   EOSABS 0.1 03/25/2020   BASOSABS 0.0 56/43/3295     Last metabolic panel Lab Results  Component Value Date   NA 142 04/09/2020   K 4.0 04/09/2020   CL 102 04/09/2020   CO2 27 04/09/2020   BUN 23 (H) 04/09/2020   CREATININE 0.96 04/09/2020   GLUCOSE 145 (H) 04/09/2020   GFRNONAA >60 04/09/2020   GFRAA 65 02/09/2020   CALCIUM 8.7 (L) 04/09/2020   PHOS 3.3 04/07/2020   PROT 7.1 04/07/2020   ALBUMIN 2.5 (L) 04/07/2020   BILITOT 0.8 04/07/2020   ALKPHOS 227 (H) 04/07/2020   AST 31 04/07/2020   ALT 24 04/07/2020   ANIONGAP 13 04/09/2020    CBG (last 3)  Recent Labs     04/11/20 2047 04/12/20 0728 04/12/20 1208  GLUCAP 203* 163* 211*     GFR: Estimated Creatinine Clearance: 72.3 mL/min (by C-G formula based on SCr of 0.96 mg/dL).  Coagulation Profile: No results for input(s): INR, PROTIME in the last 168 hours.  No results found for this or any previous visit (from the past 240 hour(s)).      Radiology Studies: No results found.      Scheduled Meds: . acetaminophen  650 mg Oral Q6H  . amitriptyline  100 mg Oral QHS  . atorvastatin  20 mg Oral Daily  . busPIRone  10 mg Oral TID  . carbamazepine  400 mg Oral BID  . chlorhexidine  15 mL Mouth Rinse BID  . clopidogrel  75 mg Oral Daily  . collagenase   Topical Daily  . docusate sodium  100 mg Oral BID  . furosemide  40 mg Oral Daily  . haloperidol lactate  2 mg Intramuscular Once  . insulin aspart  0-5 Units Subcutaneous QHS  . insulin aspart  0-6 Units Subcutaneous TID WC  . insulin glargine  10 Units Subcutaneous QHS  . lipase/protease/amylase  72,000 Units Oral TID WC  . mouth rinse  15 mL Mouth Rinse q12n4p  . metoprolol succinate  50 mg Oral Daily  . mometasone-formoterol  2 puff Inhalation BID  . nicotine  14 mg Transdermal Daily  . OLANZapine  5 mg Oral QHS  . polyethylene glycol  17 g Oral Daily   Continuous Infusions:   LOS: 22 days     Cordelia Poche, MD [facility] 04/12/2020, 5:05 PM  If 7PM-7AM, please contact night-coverage [email or web address]

## 2020-04-12 NOTE — Progress Notes (Addendum)
Patient noted to have yellow MEWS. This RN went to assess pt. Patient is baseline confused, no changes. Pt resting comfortably. Tachycardic with HR at 113 but pt is asymptomatic , denies chest pain. generalized back and foot pain of 7/10.  Will continue to monitor and follow MEWs protocol.

## 2020-04-13 LAB — GLUCOSE, CAPILLARY
Glucose-Capillary: 127 mg/dL — ABNORMAL HIGH (ref 70–99)
Glucose-Capillary: 212 mg/dL — ABNORMAL HIGH (ref 70–99)
Glucose-Capillary: 252 mg/dL — ABNORMAL HIGH (ref 70–99)
Glucose-Capillary: 209 mg/dL — ABNORMAL HIGH (ref 70–99)

## 2020-04-13 NOTE — TOC Progression Note (Addendum)
Transition of Care [facility]) - Progression Note    Patient Details  Name: Mishell Donalson MRN: [id number] Date of Birth: 05-24-1960  Transition of Care [facility]) CM/SW Contact  Spruha Weight, [name], RN Phone Number: 04/13/2020, 9:21 AM  Clinical Narrative: Awaiting psych to eval. Currently-IVC ends 12/15-[facility] following-they will clarify outcome of P2P-per attending P2P was not successful-they were closed.[facility] cannot accept IVC;sitter must be d/c within 24hrs of d/c.  12:55p-P2P info given to attending for auth to SNF/[facility] @ [facility].tel#[phone],option 4;HTX#[id number].  3:11p-P2P was not needed-patient was approved TRV#[id number] for [place]-awaiting for disposition of either SNF/[facility] to come to [facility]-patient cannot be IVC or sitter within 24hrs of d/c. Has laready been vaccinated. Will need covid.    Expected Discharge Plan: Skilled Nursing Facility Barriers to Discharge: Insurance Authorization  Expected Discharge Plan and Services Expected Discharge Plan: [place]   Discharge Planning Services: CM Consult Post Acute Care Choice: [place] Living arrangements for the past 2 months: [place] Expected Discharge Date:  (unknown)                                     Social Determinants of Health (SDOH) Interventions    Readmission Risk Interventions Readmission Risk Prevention Plan 03/18/2020 10/21/2019  Transportation Screening Complete Complete  Medication Review Press photographer) - Complete  PCP or Specialist appointment within 3-5 days of discharge Not Complete Complete  PCP/Specialist Appt Not Complete comments First available appt 12/1 -  [place] or Home Care Consult Complete Complete  SW Recovery Care/Counseling Consult Complete Complete  Palliative Care Screening Not Applicable Not [place] Not Applicable Not Applicable  Some recent data might be hidden

## 2020-04-13 NOTE — Plan of Care (Signed)
  Problem: Clinical Measurements: Goal: Will remain free from infection Outcome: Progressing Goal: Respiratory complications will improve Outcome: Progressing Goal: Cardiovascular complication will be avoided Outcome: Progressing   Problem: Nutrition: Goal: Adequate nutrition will be maintained Outcome: Progressing   Problem: Elimination: Goal: Will not experience complications related to bowel motility Outcome: Progressing Goal: Will not experience complications related to urinary retention Outcome: Progressing   Problem: Pain Managment: Goal: General experience of comfort will improve Outcome: Progressing   Problem: Safety: Goal: Ability to remain free from injury will improve Outcome: Progressing   Problem: Skin Integrity: Goal: Risk for impaired skin integrity will decrease Outcome: Progressing   Problem: Skin Integrity: Goal: Demonstration of wound healing without infection will improve Outcome: Progressing

## 2020-04-13 NOTE — Progress Notes (Signed)
PROGRESS NOTE    [name]  WVP:[id number] DOB: 08/08/1960 DOA: 03/21/2020 PCP: Riki Sheer, NP   Brief Narrative: Morgan Beasley is a 59 y.o. female patient of depression, bipolar disorder, substance abuse, pulmonary valve endocarditis, multiple CVAs.  Patient presented secondary to agitation, hallucinations, restlessness.  Patient was initially managed on Precedex drip in the ICU.  She found to have large pleural effusions with pulmonary edema as well.  Patient started on antipsychotics therapy with improvement of symptoms but still requiring inpatient [facility] health per psychiatry recommendations.   Assessment & Plan:   Principal Problem:   Acute metabolic encephalopathy Active Problems:   Peripheral neuropathy   COPD (chronic obstructive pulmonary disease) (HCC)   Diabetic peripheral neuropathy associated with type 2 diabetes mellitus ([place])   Cerebral embolism with cerebral infarction   Mixed hyperlipidemia   Psychosis, delirium, bipolar disorder Patient has been evaluated by psychiatry who recommended inpatient behavioral health admission. Patient's medications have been adjusted while inpatient and you she is currently managed on Elavil, BuSpar, Tegretol, Zyprexa. Initial plan was for inpatient geri psych admission but now patient is okay to discharge to SNF pending improved behavioral management per psychiatry recommendations. IVC expires on 12/15. -Psychiatry recommendations: can discharge to SNF with recommendation for continued sitter support until agitation is better controlled while inpatient -Continue Elavil, BuSpar, Tegretol, Zyprexa -Discontinue sitter today if patient remains with calm behavior  Acute respiratory failure with hypoxia In setting of pulmonary edema from acute CHF in addition to likely contributing OSA and COPD. Patient weaned to room air.  Acute systolic heart failure Recent echo from November 2021 significant for an EF of 40%  with associated moderately reduced right ventricular systolic function with left ventricular enlargement. Patient has been diuresed with IV Lasix. Currently euvolemic. -Continue Lasix 40 mg daily -Continue metoprolol succinate 50 mg daily  Possible obstructive sleep apnea Patient noted to have intermittent apneic spells while sleeping. Recommendation for outpatient sleep study.  History of CVA Dysarthria. Right/left occipital infarcts in addition to left basal ganglia/thalamic microhemmorahages -Continue Plavix and Lipitor  Diabetes mellitus, type II -Continue Lantus and sliding scale insulin  Chronic pancreatitis -Continue Creon  Acute kidney injury on CKD stage IIIa Currently stable  Anemia of chronic disease Currently stable. Iron panel is unremarkable.  Right supraclavicular/mediastinal adenopathy With patient's history of breast cancer treated with mastectomy, recommendation for outpatient oncology follow-up  Decreased pedal pulses Multiple LE wounds Wounds are healed. Concern for arterial insufficiency. Patient follows with vascular surgery and has a diagnosis of PVD. No current plans for intervention.  Recent serratia bacteremia Previously on Cefepime which was discontinued shortly after admission. No evidence of infection currently.  Right leg pain Patient with history of PVD but has pain over right tibia. History of fall prior to admission. X-ray of tib/fib (12/11) negative for acute fracture.   DVT prophylaxis: SCDs Code Status:   Code Status: Full Code Family Communication: Daughter at bedside and sister on telephone (12/14) Disposition Plan: Discharge to SNF when bed available/insurance authorization obtained; in addition, patient needs to be stable without the need of a sitter for safety/behavior   Consultants:   PCCM  ID  Psychiatry  Procedures:   None  Antimicrobials:  None   Subjective: Agitation last night. Required Ativan IV. No concerns  today.  Objective: Vitals:   04/13/20 0627 04/13/20 0644 04/13/20 0729 04/13/20 1311  BP:  (!) 139/99  (!) 152/87  Pulse:  91  94  Resp:  20  Temp:  98 F (36.7 C)  97.7 F (36.5 C)  TempSrc:  Oral  Oral  SpO2:  90% (!) 86% 97%  Weight: 89.1 kg     Height:        Intake/Output Summary (Last 24 hours) at 04/13/2020 1450 Last data filed at 04/13/2020 1313 Gross per 24 hour  Intake 290 ml  Output 150 ml  Net 140 ml   Filed Weights   04/10/20 0419 04/12/20 0500 04/13/20 0627  Weight: 89.4 kg 90.2 kg 89.1 kg    Examination:  General exam: Appears calm and comfortable Respiratory system: Clear to auscultation. Respiratory effort normal. Cardiovascular system: S1 & S2 heard, RRR. No murmurs, rubs, gallops or clicks. Gastrointestinal system: Abdomen is nondistended, soft and nontender. No organomegaly or masses felt. Normal bowel sounds heard. There is some edema of her right flank Central nervous system: Alert. Dysarthria Musculoskeletal: No edema. No calf tenderness Skin: No cyanosis. No rashes Psychiatry: Labile mood but possible patient was trying to joke around and "be funny"  Data Reviewed: I have personally reviewed following labs and imaging studies  CBC Lab Results  Component Value Date   WBC 7.1 04/09/2020   RBC 5.23 (H) 04/09/2020   HGB 12.6 04/09/2020   HCT 42.8 04/09/2020   MCV 81.8 04/09/2020   MCH 24.1 (L) 04/09/2020   PLT 356 04/09/2020   MCHC 29.4 (L) 04/09/2020   RDW 22.5 (H) 04/09/2020   LYMPHSABS 1.9 03/25/2020   MONOABS 1.0 03/25/2020   EOSABS 0.1 03/25/2020   BASOSABS 0.0 83/41/9622     Last metabolic panel Lab Results  Component Value Date   NA 142 04/09/2020   K 4.0 04/09/2020   CL 102 04/09/2020   CO2 27 04/09/2020   BUN 23 (H) 04/09/2020   CREATININE 0.96 04/09/2020   GLUCOSE 145 (H) 04/09/2020   GFRNONAA >60 04/09/2020   GFRAA 65 02/09/2020   CALCIUM 8.7 (L) 04/09/2020   PHOS 3.3 04/07/2020   PROT 7.1 04/07/2020    ALBUMIN 2.5 (L) 04/07/2020   BILITOT 0.8 04/07/2020   ALKPHOS 227 (H) 04/07/2020   AST 31 04/07/2020   ALT 24 04/07/2020   ANIONGAP 13 04/09/2020    CBG (last 3)  Recent Labs    04/12/20 2353 04/13/20 0739 04/13/20 1146  GLUCAP 187* 127* 212*     GFR: Estimated Creatinine Clearance: 71.8 mL/min (by C-G formula based on SCr of 0.96 mg/dL).  Coagulation Profile: No results for input(s): INR, PROTIME in the last 168 hours.  No results found for this or any previous visit (from the past 240 hour(s)).      Radiology Studies: No results found.      Scheduled Meds: . acetaminophen  650 mg Oral Q6H  . amitriptyline  100 mg Oral QHS  . atorvastatin  20 mg Oral Daily  . busPIRone  10 mg Oral TID  . carbamazepine  400 mg Oral BID  . chlorhexidine  15 mL Mouth Rinse BID  . clopidogrel  75 mg Oral Daily  . collagenase   Topical Daily  . docusate sodium  100 mg Oral BID  . furosemide  40 mg Oral Daily  . haloperidol lactate  2 mg Intramuscular Once  . insulin aspart  0-5 Units Subcutaneous QHS  . insulin aspart  0-6 Units Subcutaneous TID WC  . insulin glargine  10 Units Subcutaneous QHS  . lipase/protease/amylase  72,000 Units Oral TID WC  . mouth rinse  15 mL Mouth Rinse q12n4p  .  metoprolol succinate  50 mg Oral Daily  . mometasone-formoterol  2 puff Inhalation BID  . nicotine  14 mg Transdermal Daily  . OLANZapine  5 mg Oral QHS  . polyethylene glycol  17 g Oral Daily   Continuous Infusions:   LOS: 23 days     Cordelia Poche, MD [facility] 04/13/2020, 2:50 PM  If 7PM-7AM, please contact night-coverage [email or web address]

## 2020-04-14 LAB — GLUCOSE, CAPILLARY
Glucose-Capillary: 161 mg/dL — ABNORMAL HIGH (ref 70–99)
Glucose-Capillary: 143 mg/dL — ABNORMAL HIGH (ref 70–99)
Glucose-Capillary: 191 mg/dL — ABNORMAL HIGH (ref 70–99)
Glucose-Capillary: 186 mg/dL — ABNORMAL HIGH (ref 70–99)

## 2020-04-14 MED ORDER — ONDANSETRON HCL 4 MG/2ML IJ SOLN
4.0000 mg | Freq: Four times a day (QID) | INTRAMUSCULAR | Status: DC | PRN
  Administered 2020-04-14 – 2020-04-23 (×3): 4 mg via INTRAVENOUS
  Filled 2020-04-14 (×3): qty 2

## 2020-04-14 MED ORDER — DIPHENHYDRAMINE HCL 25 MG PO CAPS
25.0000 mg | ORAL_CAPSULE | Freq: Four times a day (QID) | ORAL | Status: AC | PRN
  Administered 2020-04-14: 25 mg via ORAL
  Filled 2020-04-14: qty 1

## 2020-04-14 NOTE — Progress Notes (Addendum)
PROGRESS NOTE  Morgan Beasley TKW:[id number] DOB: Nov 04, 1960 DOA: 03/21/2020 PCP: Riki Sheer, NP   LOS: 24 days   Brief narrative: Morgan Beasley is a 59 y.o. female with past medical history of depression, bipolar disorder, substance abuse, pulmonary valve endocarditis, multiple CVAs presented to the hospital secondary to agitation, hallucinations, restlessness.  Patient was initially managed on Precedex drip in the ICU.  She found to have large pleural effusions with pulmonary edema as well.  Patient started on antipsychotics therapy with improvement of symptoms but still requiring inpatient behavioral health per psychiatry recommendations.  Assessment/Plan:  Principal Problem:   Acute metabolic encephalopathy Active Problems:   Peripheral neuropathy   COPD (chronic obstructive pulmonary disease) (HCC)   Diabetic peripheral neuropathy associated with type 2 diabetes mellitus ([place])   Cerebral embolism with cerebral infarction   Mixed hyperlipidemia  Psychosis, delirium, bipolar disorder On Elavil, BuSpar, Tegretol, Zyprexa. Initial plan was for inpatient geri psych admission but now patient is okay to discharge to SNF. Off sitter.     Acute respiratory failure with hypoxia Resolved at this time.  Currently on room air.  Initially thought to be secondary to pulmonary edema from acute CHF with underlying obstructive sleep apnea and COPD.   Acute systolic heart failure 2D echocardiogram from 03/2020 significant for an EF of 40% with associated moderately reduced right ventricular systolic function with left ventricular enlargement patient initially received IV Lasix.  Currently euvolemic and is on metoprolol and oral Lasix   Possible obstructive sleep apnea Patient noted to have intermittent apneic spells while sleeping. Recommendation for outpatient sleep study.  History of CVA Imaging of the brain showed right/left occipital infarcts in addition to left basal  ganglia/thalamic microhemorahages.  Plan is to continue Plavix and Lipitor on discharge  Diabetes mellitus, type II -Controlled at this time.  Continue lantus and sliding scale insulin, diabetic diet.  Closely monitor.  Chronic pancreatitis -Continue Creon  Acute kidney injury on CKD stage IIIa Stable from recent labs.  Anemia of chronic disease Currently stable. Iron panel is unremarkable.  Right supraclavicular/mediastinal adenopathy  history of breast cancer treated with mastectomy, will need outpatient oncology follow-up.  Decreased pedal pulses/Multiple LE wounds secondary to peripheral vascular disease.  Right leg pain. Follows up with vascular surgery as outpatient.  Continue Plavix, Lipitor.  X-ray of the tibia-fibula on 12/11 was negative for acute fracture.  Recent serratia bacteremia Was on cefepime initially.  Off antibiotic at this time   DVT prophylaxis: SCDs Start: 03/21/20 1629  Code Status: Full code  Family Communication: With the patient's family at bedside.  Status is: Inpatient  Remains inpatient appropriate because:Need for skilled nursing facility placement, agitation requiring one-to-one sitter.   Dispo: The patient is from: Home              Anticipated d/c is to: SNF              Anticipated d/c date is: 1 to 2 days              Patient currently is medically stable to d/c.  Spoke at the interdisciplinary meeting  Consultants:  PCCM  ID  Psychiatry    Procedures:  None  Antibiotics:  . None at this time  Anti-infectives (From admission, onward)   Start     Dose/Rate Route Frequency Ordered Stop   03/21/20 1800  ceFEPIme (MAXIPIME) 2 g in sodium chloride 0.9 % 100 mL IVPB  2 g 200 mL/hr over 30 Minutes Intravenous Every 8 hours 03/21/20 1621 03/22/20 1332       Subjective: Today, patient was seen and examined at bedside.  Complains of nausea and mild abdominal pain.  No vomiting.  Has had bowel movements.   Denies any shortness of breath cough fever.  Objective: Vitals:   04/14/20 0419 04/14/20 0732  BP: (!) 151/94   Pulse: (!) 103   Resp:    Temp: (!) 97.5 F (36.4 C)   SpO2: 100% 99%    Intake/Output Summary (Last 24 hours) at 04/14/2020 2956 Last data filed at 04/13/2020 2226 Gross per 24 hour  Intake 560 ml  Output 100 ml  Net 460 ml   Filed Weights   04/10/20 0419 04/12/20 0500 04/13/20 0627  Weight: 89.4 kg 90.2 kg 89.1 kg   Body mass index is 31.7 kg/m.   Physical Exam: GENERAL: Patient is alert awake and communicative,. Not in obvious distress.  Obese HENT: No scleral pallor or icterus. Pupils equally reactive to light. Oral mucosa is moist NECK: is supple, no gross swelling noted. CHEST: Clear to auscultation. No crackles or wheezes.  Diminished breath sounds bilaterally. CVS: S1 and S2 heard, no murmur. Regular rate and rhythm.  ABDOMEN: Soft, non-tender, bowel sounds are present.  Abdominal wall edema noted EXTREMITIES: No edema. CNS: Cranial nerves are intact. No focal motor deficits.  Flat affect, anxious. SKIN: warm and dry without rashes.  Data Review: I have personally reviewed the following laboratory data and studies,  CBC: Recent Labs  Lab 04/09/20 0615  WBC 7.1  HGB 12.6  HCT 42.8  MCV 81.8  PLT 213   Basic Metabolic Panel: Recent Labs  Lab 04/09/20 0736  NA 142  K 4.0  CL 102  CO2 27  GLUCOSE 145*  BUN 23*  CREATININE 0.96  CALCIUM 8.7*   Liver Function Tests: No results for input(s): AST, ALT, ALKPHOS, BILITOT, PROT, ALBUMIN in the last 168 hours. No results for input(s): LIPASE, AMYLASE in the last 168 hours. No results for input(s): AMMONIA in the last 168 hours. Cardiac Enzymes: No results for input(s): CKTOTAL, CKMB, CKMBINDEX, TROPONINI in the last 168 hours. BNP (last 3 results) Recent Labs    03/04/20 0537 03/21/20 0748 03/24/20 0156  BNP 2,502.5* 1,352.8* 989.2*    ProBNP (last 3 results) Recent Labs     12/19/19 1546 02/09/20 0825  PROBNP 1,148* 1,185*    CBG: Recent Labs  Lab 04/13/20 0739 04/13/20 1146 04/13/20 1639 04/13/20 1944 04/14/20 0735  GLUCAP 127* 212* 252* 209* 161*   No results found for this or any previous visit (from the past 240 hour(s)).   Studies: No results found.    Flora Lipps, MD  [facility] 04/14/2020  If 7PM-7AM, please contact night-coverage

## 2020-04-14 NOTE — Progress Notes (Signed)
Occupational Therapy Treatment Patient Details Name: Morgan Beasley MRN: [id number] DOB: December 26, 1960 Today's Date: 04/14/2020    History of present illness Pt admitted from home 03/21/20  and dx with acute respiratory failure 2* pleural effusions and pulmonary edema as well as acute metabolic encephalopathy and cerebral embolism with cerebral infarction.  Pt with hx of PAD, Lymphadema, DM, diabetic neuropathy, COPD, CKD, polysubstance abuse, bipolar, and fem-pop bypass graft.   OT comments  Treatment focused on working on self care tasks. Patient mod assist for supine to sit, min assist with hand hold to transfer to [facility], and mod assist for toileting task. Patient exhibits improved balance today compared to last treatment. Attempts to perform grooming at sink or in chair - poor due to patients poor attention span. Patient continues to be limited by altered mental status - not oriented to place or situation, poor attention span and focused on locating her cell phone. Patient not aggressive and able to follow commands inconsistently today.   Follow Up Recommendations  SNF    Equipment Recommendations  None recommended by OT    Recommendations for Other Services      Precautions / Restrictions Precautions Precautions: Fall Precaution Comments: can be agitated and/or lethargic       Mobility Bed Mobility Overal bed mobility: Needs Assistance Bed Mobility: Supine to Sit     Supine to sit: Mod assist;HOB elevated     General bed mobility comments: Mod asssist for hand hold, guiding lower extremities to initiate task and use of bed pad to pivot patient to edge of bed.  Transfers Overall transfer level: Needs assistance Equipment used: Rolling walker (2 wheeled);1 person hand held assist Transfers: Sit to/from Stand Sit to Stand: Min assist Stand pivot transfers: Min assist       General transfer comment: MIn assist with hand hold to transfer to [facility] perpendicular to bed. Min  assist and use RW to take steps to recliner, turn and sit down - requiring verbal cues for safety and walk management.    Balance Overall balance assessment: Needs assistance Sitting-balance support: No upper extremity supported;Feet supported;Feet unsupported Sitting balance-Leahy Scale: Fair     Standing balance support: During functional activity;Single extremity supported Standing balance-Leahy Scale: Fair                             ADL either performed or assessed with clinical judgement   ADL       Grooming: Set up;Wash/dry face Grooming Details (indicate cue type and reason): Patient setup to wash face seated in chair. Patient however perseverates on the location of her cell phone and not participatory in performing grooming task.                 Toilet Transfer: Minimal assistance;BSC;Stand-pivot;+2 for safety/equipment Toilet Transfer Details (indicate cue type and reason): Min assist to stand and pivot to recliner. Improved balance today compared to last treatment. verbal cues for hand placement - however patient kept telling therapist to step back. Toileting- Clothing Manipulation and Hygiene: Moderate assistance;Sit to/from stand;+2 for safety/equipment Toileting - Clothing Manipulation Details (indicate cue type and reason): Patient able to assist with clothing management - but due to limited attention spon needed mod assist to perform well.             Vision       Perception     Praxis      Cognition Arousal/Alertness: Awake/alert Behavior During  Therapy: Restless Overall Cognitive Status: Impaired/Different from baseline Area of Impairment: Awareness;Safety/judgement;Following commands;Memory;Attention;Orientation                 Orientation Level: Place;Time;Situation;Disoriented to Current Attention Level: Focused Memory: Decreased recall of precautions;Decreased short-term memory Following Commands: Follows one step commands  inconsistently Safety/Judgement: Decreased awareness of safety;Decreased awareness of deficits Awareness: Intellectual Problem Solving: Decreased initiation;Requires verbal cues;Requires tactile cues General Comments: Limited today due to perseveration on location of phone, poor memory of recent events and poor attention span.        Exercises     Shoulder Instructions       General Comments      Pertinent Vitals/ Pain       Pain Assessment: No/denies pain  Home Living                                          Prior Functioning/Environment              Frequency  Min 2X/week        Progress Toward Goals  OT Goals(current goals can now be found in the care plan section)  Progress towards OT goals: Not progressing toward goals - comment (continues to be limited by altered mental status)  Acute Rehab OT Goals OT Goal Formulation: Patient unable to participate in goal setting Time For Goal Achievement: 04/28/20 Potential to Achieve Goals: [place] Discharge plan remains appropriate;Frequency remains appropriate    Co-evaluation          OT goals addressed during session: ADL's and self-care      AM-PAC OT "6 Clicks" Daily Activity     Outcome Measure   Help from another person eating meals?: A Little Help from another person taking care of personal grooming?: A Little Help from another person toileting, which includes using toliet, bedpan, or urinal?: A Lot Help from another person bathing (including washing, rinsing, drying)?: A Lot Help from another person to put on and taking off regular upper body clothing?: A Lot Help from another person to put on and taking off regular lower body clothing?: A Lot 6 Click Score: 14    End of Session Equipment Utilized During Treatment: Rolling walker  OT Visit Diagnosis: Other abnormalities of gait and mobility (R26.89);Muscle weakness (generalized) (M62.81);Other symptoms and signs involving  cognitive function   Activity Tolerance Other (comment) (restlessness and altered mental status)   Patient Left in chair;with call bell/phone within reach;with family/visitor present;with nursing/sitter in room   Nurse Communication Mobility status        Time: 9030-0923 OT Time Calculation (min): 17 min  Charges: OT General Charges $OT Visit: 1 Visit OT Treatments $Self Care/Home Management : 8-22 mins  Derl Barrow, OTR/L [place]  Office [phone] Pager: [place] 04/14/2020, 11:41 AM

## 2020-04-14 NOTE — Plan of Care (Signed)
  Problem: Education: Goal: Knowledge of General Education information will improve Description: Including pain rating scale, medication(s)/side effects and non-pharmacologic comfort measures Outcome: Progressing   Problem: Health Behavior/Discharge Planning: Goal: Ability to manage health-related needs will improve Outcome: Progressing   Problem: Activity: Goal: Capacity to carry out activities will improve Outcome: Progressing   Problem: Education: Goal: Ability to incorporate positive changes in behavior to improve self-esteem will improve Outcome: Progressing

## 2020-04-15 LAB — RESP PANEL BY RT-PCR (FLU A&B, COVID) ARPGX2
Influenza A by PCR: NEGATIVE
Influenza B by PCR: NEGATIVE
SARS Coronavirus 2 by RT PCR: NEGATIVE

## 2020-04-15 LAB — GLUCOSE, CAPILLARY
Glucose-Capillary: 209 mg/dL — ABNORMAL HIGH (ref 70–99)
Glucose-Capillary: 165 mg/dL — ABNORMAL HIGH (ref 70–99)
Glucose-Capillary: 180 mg/dL — ABNORMAL HIGH (ref 70–99)
Glucose-Capillary: 178 mg/dL — ABNORMAL HIGH (ref 70–99)

## 2020-04-15 MED ORDER — INSULIN ASPART 100 UNIT/ML ~~LOC~~ SOLN: 0.0000 [IU] | Freq: Three times a day (TID) | SUBCUTANEOUS | Status: DC

## 2020-04-15 MED ORDER — SIMETHICONE 80 MG PO CHEW: 80.0000 mg | CHEWABLE_TABLET | Freq: Four times a day (QID) | ORAL | 0 refills | Status: DC | PRN

## 2020-04-15 MED ORDER — DOCUSATE SODIUM 100 MG PO CAPS: 100.0000 mg | ORAL_CAPSULE | Freq: Two times a day (BID) | ORAL | Status: DC | PRN

## 2020-04-15 MED ORDER — OLANZAPINE 5 MG PO TABS: 5.0000 mg | ORAL_TABLET | Freq: Every day | ORAL | Status: DC

## 2020-04-15 MED ORDER — HYDROCORTISONE 1 % EX CREA: 1.0000 "application " | TOPICAL_CREAM | Freq: Three times a day (TID) | CUTANEOUS | Status: DC | PRN

## 2020-04-15 MED ORDER — NICOTINE 14 MG/24HR TD PT24: 14.0000 mg | MEDICATED_PATCH | Freq: Every day | TRANSDERMAL | 0 refills | Status: DC

## 2020-04-15 MED ORDER — BUSPIRONE HCL 10 MG PO TABS: 10.0000 mg | ORAL_TABLET | Freq: Three times a day (TID) | ORAL | Status: DC

## 2020-04-15 MED ORDER — CARBAMAZEPINE 200 MG PO TABS: 400.0000 mg | ORAL_TABLET | Freq: Two times a day (BID) | ORAL | Status: DC

## 2020-04-15 MED ORDER — INSULIN GLARGINE 100 UNIT/ML ~~LOC~~ SOLN: 20.0000 [IU] | Freq: Every day | SUBCUTANEOUS | Status: DC

## 2020-04-15 MED ORDER — ONDANSETRON 4 MG PO TBDP: 4.0000 mg | ORAL_TABLET | Freq: Three times a day (TID) | ORAL | Status: DC | PRN

## 2020-04-15 MED ORDER — ONDANSETRON 4 MG PO TBDP
4.0000 mg | ORAL_TABLET | Freq: Three times a day (TID) | ORAL | Status: DC | PRN
  Administered 2020-04-15 – 2020-04-24 (×4): 4 mg via ORAL
  Filled 2020-04-15 (×4): qty 1

## 2020-04-15 MED ORDER — ACETAMINOPHEN 325 MG PO TABS: 650.0000 mg | ORAL_TABLET | Freq: Four times a day (QID) | ORAL | Status: DC

## 2020-04-15 MED ORDER — POLYETHYLENE GLYCOL 3350 17 G PO PACK: 17.0000 g | PACK | Freq: Every day | ORAL | 0 refills | Status: DC | PRN

## 2020-04-15 MED ORDER — INSULIN ASPART 100 UNIT/ML ~~LOC~~ SOLN: 0.0000 [IU] | Freq: Every day | SUBCUTANEOUS | Status: DC

## 2020-04-15 NOTE — TOC Progression Note (Signed)
Transition of Care [facility]) - Progression Note    Patient Details  Name: Garima Chronis MRN: [id number] Date of Birth: [birth year]  [facility] CM/SW Contact  Salwa Bai, [name], RN Phone Number: 04/15/2020, 3:52 PM  Clinical Narrative:  [facility] declined to accept patient for SNF. [facility] to review await response-if they accept they will start auth.Updated supv/MD/nsg.     Expected Discharge Plan: [place] Barriers to Discharge: Insurance Authorization  Expected Discharge Plan and Services Expected Discharge Plan: [place]   Discharge Planning Services: CM Consult Post Acute Care Choice: [place] Living arrangements for the past 2 months: [place] Expected Discharge Date: 04/15/20                                     Social Determinants of Health (SDOH) Interventions    Readmission Risk Interventions Readmission Risk Prevention Plan 03/18/2020 10/21/2019  Transportation Screening Complete Complete  Medication Review (RN Care Manager) - Complete  PCP or Specialist appointment within 3-5 days of discharge Not Complete Complete  PCP/Specialist Appt Not Complete comments First available appt 12/1 -  [place] or Home Care Consult Complete Complete  SW Recovery Care/Counseling Consult Complete Complete  Palliative Care Screening Not Applicable Not [place] Not Applicable Not Applicable  Some recent data might be hidden

## 2020-04-15 NOTE — Progress Notes (Signed)
Pt refusing telemetry at this time. Will attempt again later. MD made aware. Will monitor patient.

## 2020-04-15 NOTE — Discharge Summary (Addendum)
Physician Discharge Summary  Morgan Perri Fronczak UOR:[id number] DOB: [birth year] DOA: 03/21/2020  PCP: Riki Sheer, NP  Admit date: 03/21/2020 Discharge date: 04/15/2020  Admitted From: Home  Discharge disposition: Skilled nursing facility   Recommendations for Outpatient Follow-Up:   . Follow up with your primary care provider at the skilled nursing facility in 3 to 5 days  . Check CBC, BMP, magnesium in the next visit . Patient does have history of peripheral vascular disease.  Would benefit from vascular surgery follow-up as outpatient. . History of breast cancer would benefit from ongoing oncology follow-up as outpatient   Discharge Diagnosis:   Principal Problem:   Acute metabolic encephalopathy Active Problems:   Peripheral neuropathy   COPD (chronic obstructive pulmonary disease) (HCC)   Diabetic peripheral neuropathy associated with type 2 diabetes mellitus ([place])   Cerebral embolism with cerebral infarction   Mixed hyperlipidemia    Discharge Condition: Improved.  Diet recommendation: Regular   Wound care: None.  Code status: Full.   History of Present Illness:   Morgan Beasley a 59 y.o.female with past medical history of depression, bipolar disorder, substance abuse, pulmonary valve endocarditis, multiple CVAs presented to the hospital secondary to agitation, hallucinations, restlessness. Patient was initially managed on Precedex drip in the ICU. She found to have large pleural effusions with pulmonary edema as well. Patient started on antipsychotics therapy with improvement of symptoms   Hospital Course:   Following conditions were addressed during hospitalization as listed below,  Psychosis, delirium, bipolar disorder On Elavil, BuSpar, Tegretol, Zyprexa.Initial plan was for inpatient geri psych admission but now patient is okay to discharge to SNF. Off sitter.     Acute respiratory failure with hypoxia Resolved at this time.   Currently on room air.  Initially thought to be secondary to pulmonary edema from acute CHF with underlying obstructive sleep apnea and COPD.   Acute systolic heart failure 2D echocardiogram from 03/2020 significant for an EF of 40% with associated moderately reduced right ventricular systolic function with left ventricular enlargement patient initially received IV Lasix.  Currently euvolemic and is on metoprolol and oral Lasix   Possible obstructive sleep apnea Patient noted to have intermittent apneic spells while sleeping. Recommendation for outpatient sleep study.  History of CVA Imaging of the brain showed right/left occipital infarcts in addition to left basal ganglia/thalamic microhemorahages.  Plan is to continue Plavix and Lipitor on discharge  Diabetes mellitus, type II -Controlled at this time.  Continue lantus and sliding scale insulin, diabetic diet.  Closely monitor.  Chronic pancreatitis -Continue Creon  Acute kidney injury on CKD stage IIIa Stable from recent labs.  Anemia of chronic disease Currently stable. Iron panel is unremarkable.  Right supraclavicular/mediastinal adenopathy  history of breast cancer treated with mastectomy, will need outpatient oncology follow-up.  Decreased pedal pulses/Multiple LE wounds secondary to peripheral vascular disease.  Right leg pain. Follows up with vascular surgery as outpatient.  Continue Plavix, Lipitor.  X-ray of the tibia-fibula on 12/11 was negative for acute fracture.  Recent serratia bacteremia Was on cefepime initially.  Off antibiotic at this time  Disposition.  At this time, patient is stable for disposition to skilled nursing facility.  Medical Consultants:    PCCM  ID  Psychiatry    Procedures:    None Subjective:   Today, patient was seen and examined at bedside.  Denies any nausea vomiting abdominal pain.  Was off sitter   Discharge Exam:   Vitals:   04/15/20 0626 04/15/20  1127   BP: (!) 150/100 (!) 142/90  Pulse: (!) 107 (!) 109  Resp:  20  Temp: 97.9 F (36.6 C) 97.7 F (36.5 C)  SpO2: 93% 99%   Vitals:   04/14/20 1000 04/15/20 0424 04/15/20 0626 04/15/20 1127  BP: (!) 173/102 (!) 154/98 (!) 150/100 (!) 142/90  Pulse: (!) 105 (!) 106 (!) 107 (!) 109  Resp:  18  20  Temp: 98.4 F (36.9 C) 98 F (36.7 C) 97.9 F (36.6 C) 97.7 F (36.5 C)  TempSrc: Oral Oral  Oral  SpO2:  98% 93% 99%  Weight:      Height:        General: Alert awake, not in obvious distress, obese HENT: pupils equally reacting to light,  No scleral pallor or icterus noted. Oral mucosa is moist.  Chest:  Clear breath sounds.  Diminished breath sounds bilaterally. No crackles or wheezes.  CVS: S1 &S2 heard. No murmur.  Regular rate and rhythm. Abdomen: Soft, nontender, nondistended.  Bowel sounds are heard.   Extremities: No cyanosis, clubbing or edema.  Peripheral pulses are palpable. Psych: Alert, awake and communicative, flat affect CNS:  No cranial nerve deficits.  Power equal in all extremities.   Skin: Warm and dry.  The results of significant diagnostics from this hospitalization (including imaging, microbiology, ancillary and laboratory) are listed below for reference.     Diagnostic Studies:   DG Chest 1 View  Result Date: 03/22/2020 CLINICAL DATA:  Worsening altered mental status and shortness of breath. EXAM: CHEST  1 VIEW COMPARISON:  Chest radiograph and CT 03/21/2020 FINDINGS: The cardiac silhouette remains mildly enlarged. A left PICC terminates over the mid SVC, unchanged. Interstitial and basilar predominant airspace opacities are stable to minimally increased compared to yesterday's radiograph. There is a persistent small right pleural effusion. No pneumothorax is identified. IMPRESSION: Stable to minimally increased bilateral lung opacities likely reflecting edema. Persistent small right pleural effusion. Electronically Signed   By: Logan Bores M.D.   On:  03/22/2020 07:48   CT Head Wo Contrast  Result Date: 03/21/2020 CLINICAL DATA:  59 year old female with altered mental status. Recent RIGHT occipital infarct. EXAM: CT HEAD WITHOUT CONTRAST TECHNIQUE: Contiguous axial images were obtained from the base of the skull through the vertex without intravenous contrast. COMPARISON:  02/29/2020 head CT and prior studies FINDINGS: Brain: A RIGHT occipital infarct appears unchanged from 02/29/2020. No new infarct, midline shift, hydrocephalus, extra-axial collection, hemorrhage or mass lesion identified. A remote LEFT occipital infarct and mild chronic small-vessel white matter ischemic changes are again noted. Vascular: Carotid atherosclerotic calcifications are noted. Skull: Normal. Negative for fracture or focal lesion. Sinuses/Orbits: No acute finding. Other: None. IMPRESSION: 1. No evidence of acute intracranial abnormality. 2. Unchanged RIGHT occipital infarct.  No evidence of hemorrhage. 3. Mild chronic small-vessel white matter ischemic changes and remote LEFT occipital infarct. Electronically Signed   By: Margarette Canada M.D.   On: 03/21/2020 11:58   CT CHEST WO CONTRAST  Result Date: 03/21/2020 CLINICAL DATA:  Swelling. Wounds in bilateral lower extremities. Left arm appears swollen. Infection in left foot. Abdominal pain. Abdominal distention. EXAM: CT CHEST, ABDOMEN, AND PELVIS WITH CONTRAST TECHNIQUE: Multidetector CT imaging of the chest, abdomen and pelvis was performed following the standard protocol during bolus administration of intravenous contrast. CONTRAST:  159m OMNIPAQUE IOHEXOL 300 MG/ML  SOLN COMPARISON:  CT of the chest, abdomen, and pelvis February 24, 2020 FINDINGS: CT CHEST FINDINGS Cardiovascular: Three-vessel calcified atherosclerosis identified. Persistent cardiomegaly. Mild  atherosclerosis in the thoracic aorta. The thoracic aorta is nonaneurysmal with no dissection. Central pulmonary arteries are normal in caliber. No obvious central  filling defects. Mediastinum/Nodes: The patient is status post right mastectomy. The chest wall is otherwise normal. The thyroid and esophagus are normal. There is a small left pleural effusion, similar in the interval. There is a small to moderate right pleural effusion which is probably a little larger in the interval. No pericardial effusion. Mediastinal adenopathy again identified and not significantly changed. A right paratracheal node is unchanged measuring 2.9 by 1.8 cm on both studies. Other enlarged mediastinal are without significant change. An enlarged right supraclavicular node remains measuring 3.3 by 1.2 cm on today's study and the previous study. Shotty nodes in the bilateral axilla are stable. A left PICC line terminates in the SVC below the brachiocephalic confluence. Lungs/Pleura: Central airways are normal. No pneumothorax. Interlobular septal thickening consistent with mild edema. Right greater than left pleural effusions with associated atelectasis. No new infiltrates to suggest pneumonia. No pulmonary nodules or masses are identified. Musculoskeletal: Degenerative changes in the thoracic spine. No acute bony abnormalities. CT ABDOMEN PELVIS FINDINGS Hepatobiliary: No focal liver abnormality is seen. No gallstones, gallbladder wall thickening, or biliary dilatation. Pancreas: Unremarkable. No pancreatic ductal dilatation or surrounding inflammatory changes. Spleen: Normal in size without focal abnormality. Adrenals/Urinary Tract: Adrenal glands are unremarkable. Kidneys are normal, without renal calculi, focal lesion, or hydronephrosis. Bladder is unremarkable. Stomach/Bowel: Stomach is within normal limits. Appendix appears normal. No evidence of bowel wall thickening, distention, or inflammatory changes. Vascular/Lymphatic: Mild calcified atherosclerosis in the inferior abdominal aorta, extending into the iliac vessels. No definite adenopathy in the abdomen or pelvis. Reproductive: Status post  hysterectomy. No adnexal masses. Other: Free fluid in the pelvis. Significant edema in the subcutaneous fat diffusely. Skin thickening, particularly dependently, likely due to edema. No free air. Musculoskeletal: Degenerative changes in the left greater than right hip. Degenerative changes in the lumbar spine. IMPRESSION: 1. Free fluid in the pelvis, pleural effusions, edema in the subcutaneous fat, and skin thickening are all likely due to volume overload. 2. Pulmonary venous congestion/mild pulmonary edema. 3. Adenopathy in the right supraclavicular region and mediastinum, unchanged since October of 2020. The adenopathy could be reactive, metastatic, or due to lymphoma. Recommend clinical correlation and attention on follow-up. 4. Atherosclerosis in the thoracic and abdominal aorta. Three-vessel coronary artery disease. 5. No other significant abnormalities. Electronically Signed   By: Dorise Bullion III M.D   On: 03/21/2020 12:24   CT ABDOMEN PELVIS W CONTRAST  Result Date: 03/21/2020 CLINICAL DATA:  Swelling. Wounds in bilateral lower extremities. Left arm appears swollen. Infection in left foot. Abdominal pain. Abdominal distention. EXAM: CT CHEST, ABDOMEN, AND PELVIS WITH CONTRAST TECHNIQUE: Multidetector CT imaging of the chest, abdomen and pelvis was performed following the standard protocol during bolus administration of intravenous contrast. CONTRAST:  146m OMNIPAQUE IOHEXOL 300 MG/ML  SOLN COMPARISON:  CT of the chest, abdomen, and pelvis February 24, 2020 FINDINGS: CT CHEST FINDINGS Cardiovascular: Three-vessel calcified atherosclerosis identified. Persistent cardiomegaly. Mild atherosclerosis in the thoracic aorta. The thoracic aorta is nonaneurysmal with no dissection. Central pulmonary arteries are normal in caliber. No obvious central filling defects. Mediastinum/Nodes: The patient is status post right mastectomy. The chest wall is otherwise normal. The thyroid and esophagus are normal. There  is a small left pleural effusion, similar in the interval. There is a small to moderate right pleural effusion which is probably a little larger in the interval. No  pericardial effusion. Mediastinal adenopathy again identified and not significantly changed. A right paratracheal node is unchanged measuring 2.9 by 1.8 cm on both studies. Other enlarged mediastinal are without significant change. An enlarged right supraclavicular node remains measuring 3.3 by 1.2 cm on today's study and the previous study. Shotty nodes in the bilateral axilla are stable. A left PICC line terminates in the SVC below the brachiocephalic confluence. Lungs/Pleura: Central airways are normal. No pneumothorax. Interlobular septal thickening consistent with mild edema. Right greater than left pleural effusions with associated atelectasis. No new infiltrates to suggest pneumonia. No pulmonary nodules or masses are identified. Musculoskeletal: Degenerative changes in the thoracic spine. No acute bony abnormalities. CT ABDOMEN PELVIS FINDINGS Hepatobiliary: No focal liver abnormality is seen. No gallstones, gallbladder wall thickening, or biliary dilatation. Pancreas: Unremarkable. No pancreatic ductal dilatation or surrounding inflammatory changes. Spleen: Normal in size without focal abnormality. Adrenals/Urinary Tract: Adrenal glands are unremarkable. Kidneys are normal, without renal calculi, focal lesion, or hydronephrosis. Bladder is unremarkable. Stomach/Bowel: Stomach is within normal limits. Appendix appears normal. No evidence of bowel wall thickening, distention, or inflammatory changes. Vascular/Lymphatic: Mild calcified atherosclerosis in the inferior abdominal aorta, extending into the iliac vessels. No definite adenopathy in the abdomen or pelvis. Reproductive: Status post hysterectomy. No adnexal masses. Other: Free fluid in the pelvis. Significant edema in the subcutaneous fat diffusely. Skin thickening, particularly  dependently, likely due to edema. No free air. Musculoskeletal: Degenerative changes in the left greater than right hip. Degenerative changes in the lumbar spine. IMPRESSION: 1. Free fluid in the pelvis, pleural effusions, edema in the subcutaneous fat, and skin thickening are all likely due to volume overload. 2. Pulmonary venous congestion/mild pulmonary edema. 3. Adenopathy in the right supraclavicular region and mediastinum, unchanged since October of 2020. The adenopathy could be reactive, metastatic, or due to lymphoma. Recommend clinical correlation and attention on follow-up. 4. Atherosclerosis in the thoracic and abdominal aorta. Three-vessel coronary artery disease. 5. No other significant abnormalities. Electronically Signed   By: Dorise Bullion III M.D   On: 03/21/2020 12:24   DG Chest Port 1 View  Result Date: 03/21/2020 CLINICAL DATA:  Sepsis and shortness of breath. EXAM: PORTABLE CHEST 1 VIEW COMPARISON:  03/11/2020 and prior radiographs FINDINGS: Cardiomegaly and pulmonary vascular congestion again noted. Bilateral airspace opacities have decreased from 03/11/2020. There is no evidence of pneumothorax or large pleural effusion. A LEFT-sided PICC line is again identified. IMPRESSION: Decreased bilateral airspace which may represent pneumonia and/or edema. No other significant change. Electronically Signed   By: Margarette Canada M.D.   On: 03/21/2020 08:04     Labs:   Basic Metabolic Panel: Recent Labs  Lab 04/09/20 0736  NA 142  K 4.0  CL 102  CO2 27  GLUCOSE 145*  BUN 23*  CREATININE 0.96  CALCIUM 8.7*   GFR Estimated Creatinine Clearance: 71.8 mL/min (by C-G formula based on SCr of 0.96 mg/dL). Liver Function Tests: No results for input(s): AST, ALT, ALKPHOS, BILITOT, PROT, ALBUMIN in the last 168 hours. No results for input(s): LIPASE, AMYLASE in the last 168 hours. No results for input(s): AMMONIA in the last 168 hours. Coagulation profile No results for input(s): INR,  PROTIME in the last 168 hours.  CBC: Recent Labs  Lab 04/09/20 0615  WBC 7.1  HGB 12.6  HCT 42.8  MCV 81.8  PLT 356   Cardiac Enzymes: No results for input(s): CKTOTAL, CKMB, CKMBINDEX, TROPONINI in the last 168 hours. BNP: Invalid input(s): POCBNP CBG: Recent Labs  Lab 04/14/20 1141 04/14/20 1624 04/14/20 2127 04/15/20 0743 04/15/20 1125  GLUCAP 143* 191* 186* 209* 165*   D-Dimer No results for input(s): DDIMER in the last 72 hours. Hgb A1c No results for input(s): HGBA1C in the last 72 hours. Lipid Profile No results for input(s): CHOL, HDL, LDLCALC, TRIG, CHOLHDL, LDLDIRECT in the last 72 hours. Thyroid function studies No results for input(s): TSH, T4TOTAL, T3FREE, THYROIDAB in the last 72 hours.  Invalid input(s): FREET3 Anemia work up No results for input(s): VITAMINB12, FOLATE, FERRITIN, TIBC, IRON, RETICCTPCT in the last 72 hours. Microbiology Recent Results (from the past 240 hour(s))  Resp Panel by RT-PCR (Flu A&B, Covid) Nasopharyngeal Swab     Status: None   Collection Time: 04/15/20 10:20 AM   Specimen: Nasopharyngeal Swab; Nasopharyngeal(NP) swabs in vial transport medium  Result Value Ref Range Status   SARS Coronavirus 2 by RT PCR NEGATIVE NEGATIVE Final    Comment: (NOTE) SARS-CoV-2 target nucleic acids are NOT DETECTED.  The SARS-CoV-2 RNA is generally detectable in upper respiratory specimens during the acute phase of infection. The lowest concentration of SARS-CoV-2 viral copies this assay can detect is 138 copies/mL. A negative result does not preclude SARS-Cov-2 infection and should not be used as the sole basis for treatment or other patient management decisions. A negative result may occur with  improper specimen collection/handling, submission of specimen other than nasopharyngeal swab, presence of viral mutation(s) within the areas targeted by this assay, and inadequate number of viral copies(<138 copies/mL). A negative result must be  combined with clinical observations, patient history, and epidemiological information. The expected result is Negative.  Fact Sheet for Patients:  EntrepreneurPulse.com.au  Fact Sheet for Healthcare Providers:  IncredibleEmployment.be  This test is no t yet approved or cleared by the [place] FDA and  has been authorized for detection and/or diagnosis of SARS-CoV-2 by FDA under an Emergency Use Authorization (EUA). This EUA will remain  in effect (meaning this test can be used) for the duration of the COVID-19 declaration under Section 564(b)(1) of the Act, 21 U.S.C.section 360bbb-3(b)(1), unless the authorization is terminated  or revoked sooner.       Influenza A by PCR NEGATIVE NEGATIVE Final   Influenza B by PCR NEGATIVE NEGATIVE Final    Comment: (NOTE) The Xpert Xpress SARS-CoV-2/FLU/RSV plus assay is intended as an aid in the diagnosis of influenza from Nasopharyngeal swab specimens and should not be used as a sole basis for treatment. Nasal washings and aspirates are unacceptable for Xpert Xpress SARS-CoV-2/FLU/RSV testing.  Fact Sheet for Patients: EntrepreneurPulse.com.au  Fact Sheet for Healthcare Providers: IncredibleEmployment.be  This test is not yet approved or cleared by the [place] FDA and has been authorized for detection and/or diagnosis of SARS-CoV-2 by FDA under an Emergency Use Authorization (EUA). This EUA will remain in effect (meaning this test can be used) for the duration of the COVID-19 declaration under Section 564(b)(1) of the Act, 21 U.S.C. section 360bbb-3(b)(1), unless the authorization is terminated or revoked.  Performed at [facility], [place] [address] [place], [place] [address]      Discharge Instructions:   Discharge Instructions    Diet Carb Modified   Complete by: As directed    Discharge instructions   Complete by: As  directed    Follow-up with your primary care provider at the skilled nursing facility in 3 to 5 days.  Take medications as prescribed.   Increase activity slowly   Complete by: As directed  No wound care   Complete by: As directed      Allergies as of 04/15/2020      Reactions   Tramadol Swelling   Nsaids Other (See Comments)   Pancreatitis   Tolmetin Other (See Comments)   Pancreatitis   Tylenol [acetaminophen] Other (See Comments)   unknown   Aspirin Other (See Comments)   "Makes my pancreas act up"       Medication List    STOP taking these medications   ceFEPime  IVPB Commonly known as: MAXIPIME   dapagliflozin propanediol 10 MG Tabs tablet Commonly known as: FARXIGA   gabapentin 400 MG capsule Commonly known as: NEURONTIN   HumaLOG 100 UNIT/ML injection Generic drug: insulin lispro   LORazepam 0.5 MG tablet Commonly known as: ATIVAN   oxyCODONE 15 MG immediate release tablet Commonly known as: ROXICODONE   potassium chloride 10 MEQ tablet Commonly known as: KLOR-CON     TAKE these medications   acetaminophen 325 MG tablet Commonly known as: TYLENOL Take 2 tablets (650 mg total) by mouth every 6 (six) hours.   Adult One Daily Gummies Chew Chew 1 capsule by mouth daily.   amitriptyline 100 MG tablet Commonly known as: ELAVIL Take 100 mg by mouth at bedtime.   atorvastatin 20 MG tablet Commonly known as: LIPITOR Take 1 tablet (20 mg total) by mouth daily.   budesonide-formoterol 160-4.5 MCG/ACT inhaler Commonly known as: SYMBICORT Inhale 2 puffs into the lungs 2 (two) times daily.   busPIRone 10 MG tablet Commonly known as: BUSPAR Take 1 tablet (10 mg total) by mouth 3 (three) times daily.   carbamazepine 200 MG tablet Commonly known as: TEGRETOL Take 2 tablets (400 mg total) by mouth 2 (two) times daily.   clopidogrel 75 MG tablet Commonly known as: Plavix Take 1 tablet (75 mg total) by mouth daily.   docusate sodium 100 MG  capsule Commonly known as: COLACE Take 1 capsule (100 mg total) by mouth 2 (two) times daily as needed for mild constipation or moderate constipation.   furosemide 40 MG tablet Commonly known as: LASIX Take 1 tablet (40 mg total) by mouth daily.   hydrocortisone cream 1 % Apply 1 application topically 3 (three) times daily as needed for itching (minor skin irritation).   insulin aspart 100 UNIT/ML injection Commonly known as: novoLOG Inject 0-6 Units into the skin 3 (three) times daily with meals.   insulin aspart 100 UNIT/ML injection Commonly known as: novoLOG Inject 0-5 Units into the skin at bedtime. CBG < 70: Implement Hypoglycemia Standing Orders and refer to Hypoglycemia Standing Orders sidebar report  CBG 70 - 120: 0 units  CBG 121 - 150: 0 units  CBG 151 - 200: 0 units  CBG 201 - 250: 2 units  CBG 251 - 300: 3 units  CBG 301 - 350: 4 units  CBG 351 - 400: 5 units  CBG > 400 call MD and obtain STAT lab verification   insulin glargine 100 UNIT/ML injection Commonly known as: Lantus Inject 0.2 mLs (20 Units total) into the skin at bedtime. What changed:   how much to take  when to take this   lipase/protease/amylase 36000 UNITS Cpep capsule Commonly known as: Creon Take 2 capsule by mouth with each meal and 1 capsule by mouth with each snack What changed:   how much to take  how to take this  when to take this  additional instructions   metoprolol succinate 50 MG 24 hr tablet  Commonly known as: TOPROL-XL Take 1 tablet (50 mg total) by mouth daily. Take with or immediately following a meal. What changed: additional instructions   Narcan 4 MG/0.1ML Liqd nasal spray kit Generic drug: naloxone Place 1 spray into the nose as needed (opioid overdose).   nicotine 14 mg/24hr patch Commonly known as: NICODERM CQ - dosed in mg/24 hours Place 1 patch (14 mg total) onto the skin daily. Start taking on: April 16, 2020   OLANZapine 5 MG tablet Commonly known  as: ZYPREXA Take 1 tablet (5 mg total) by mouth at bedtime.   ondansetron 4 MG disintegrating tablet Commonly known as: ZOFRAN-ODT Take 1 tablet (4 mg total) by mouth every 8 (eight) hours as needed for nausea or vomiting (if no IV access).   polyethylene glycol 17 g packet Commonly known as: MIRALAX / GLYCOLAX Take 17 g by mouth daily as needed for moderate constipation.   ProAir HFA 108 (90 Base) MCG/ACT inhaler Generic drug: albuterol Inhale 2 puffs into the lungs every 6 (six) hours as needed for wheezing or shortness of breath.   simethicone 80 MG chewable tablet Commonly known as: MYLICON Chew 1 tablet (80 mg total) by mouth 4 (four) times daily as needed (gas pain).       Contact information for after-discharge care    [facility] .   Service: Skilled Nursing Contact information: [place] [phone]                   Time coordinating discharge: 39 minutes  Signed:  Lanett Lasorsa  [facility] 04/15/2020, 11:31 AM

## 2020-04-15 NOTE — TOC Transition Note (Addendum)
Transition of Care [facility]) - CM/SW Discharge Note   Patient Details  Name: Morgan Beasley MRN: [id number] Date of Birth: 03-27-1961  Transition of Care [facility]) CM/SW Contact:  Dessa Phi, RN Phone Number: 04/15/2020, 9:08 AM   Clinical Narrative:  If medically stable d/c today to [facility] aware. Covid to be ordered-await results.  12p-Waiting for d/c summary & covid results. 1:40p-awaiting MG to return call for rm#,nsg tel# for report. 1:56p-Going to rm#103A [facility],tel#336 [place]. PTAR called. 2:41p-TC admissions Coordinator Shazma-informed nsg is unable to reach nurse @ [facility] to give report-will try tel# again ask for Nurse on [facility] aware.   Final next level of care: Skilled Nursing Facility Barriers to Discharge: No Barriers Identified   Patient Goals and CMS Choice Patient states their goals for this hospitalization and ongoing recovery are:: unable to state due to confusion CMS Medicare.gov Compare Post Acute Care list provided to:: Patient Represenative (must comment) (daughters) Choice offered to / list presented to : Adult Children  Discharge Placement PASRR number recieved: 03/22/20 Existing PASRR number confirmed : 04/10/20          Patient [facility] Patient to be transferred to facility by: [place] Name of family member notified: [name] [phone] Patient and family notified of of transfer: 04/15/20  Discharge Plan and Services   Discharge Planning Services: CM Consult Post Acute Care Choice: [place]                               Social Determinants of Health ([place]) Interventions     Readmission Risk Interventions Readmission Risk Prevention Plan 03/18/2020 10/21/2019  Transportation Screening Complete Complete  Medication Review (RN Care Manager) - Complete  PCP or Specialist appointment within 3-5 days of discharge Not Complete Complete  PCP/Specialist Appt Not  Complete comments First available appt 12/1 -  [place] or [place] Complete Complete  SW Recovery Care/Counseling Consult Complete Complete  Palliative Care Screening Not Applicable Not [place] Not Applicable Not Applicable  Some recent data might be hidden

## 2020-04-15 NOTE — Care Management Important Message (Signed)
Important Message  Patient Details IM Letter given to the Patient. Name: Morgan Beasley MRN: [id number] Date of Birth: 21-Oct-1960   Medicare Important Message Given:  Yes     Kerin Salen 04/15/2020, 10:04 AM

## 2020-04-15 NOTE — TOC Progression Note (Signed)
Transition of Care [facility]) - Progression Note    Patient Details  Name: Morgan Beasley MRN: [id number] Date of Birth: Nov 04, 1960  Transition of Care [facility]) CM/SW Contact  Heli Dino, [name], RN Phone Number: 04/15/2020, 8:57 AM  Clinical Narrative:  1. 1.7 mi [place] at [place], [place] [address] [phone] Overall rating Average 2. 2 mi [place] A Masonic and Royal [place], [place] [address] [phone] Overall rating Much above average 3. 2.1 mi [facility] at the [place], [place] [address] [phone] Overall rating Below average 4. 2.1 mi [place] at [place], [place] [address] [phone] Overall rating Much below average 5. 2.2 mi [place], [place] [address] [phone] Overall rating Much below average 6. 2.6 [place], [place] [address] [phone] Overall rating Much above average 7. 3.2 mi [place] [address] [place], [place] [address] [phone] Overall rating Average 8. 3.3 mi [facility] [address] [place], [place] [address] [phone] Overall rating Much below average 9. 3.6 mi [facility] [place], [place] [address] [phone] Overall rating Below average 10. 4.3 [place], [place] [address] [phone] Overall rating Below average 11. 4.8 mi [facility] at [place], [place] [address] [phone] Overall rating Much above average 12. [place], [place] [address] [phone] Overall  rating Much above average 13. 6.3 mi [facility] [address] [place], [place] [address] [phone] Overall rating Average 14. 8.4 [place] and Trinity [place]orter [place], [place] [address] [phone] Overall rating Much below average 15. 8.3 [place], [place] [address] [phone] Overall rating Above average 16. 9.7 mi [facility] [address] [place], [place] [address] [phone] Overall rating Average 17. 10 mi [facility] [address] [place], [place] [address] [phone] Overall rating Much above average 18. 11.4 mi [place] at [facility] [address] [place], [place] [address] [phone] Overall rating Much above average 19. 13.4 mi [facility] [address] [place], [place] [address] [phone] Overall rating Much below average 20. 13.5 [facility] [address] [place], [place] [address] [phone] Overall rating Much below average 21. 14.9 mi The [address] [address] [place], [place] [address] [phone] Overall rating Much below average 22. 15.2 mi [facility] at Adena [place], [place] [address] [phone] Overall rating Much below average 23. 15.3 mi [facility] [place], [place] [address] [phone] Overall rating Below average 24. 15.4 [place] [address] [place], [place] [address] [phone] Overall rating Much below average 25. [place],  [address] [phone] Overall rating Much above average 26. 16.1 [place],  [address] [phone] Overall rating Much above average 27. 16.5 mi [place] [address] [place],  [place] [address] [phone] Overall rating Below average 28. 17.2 mi [place], [place] [address] [phone] Overall rating Average 29. 27.7 [facility] [address] [place], [place] [address] [phone] Overall rating Much below average 30. 19.4 mi [address] at [employer] at [facility], [place] [address] [phone] Overall rating Much above average 31. 20.5 mi [facility] [address] [place], [place] [address] [phone] Overall rating Much below average 32. 20.8 mi [facility] [place], [place] [address] [phone] Overall rating Below average 33. [address] [place], [place] [address] [phone] Overall rating Below average 34. 21.1 [place], [place] [address] [phone] Overall rating Much above average 35. [place] [address] [place], [place] [address] [phone] Overall rating Below average 36. 22.2 [place] [address] [place], [place] [address] [phone] Overall rating Average 37. 22.7 mi [facility] [address] [place], [place] [address] [phone] Overall rating Above average 38. 22.7 [address] [place], [place] [address] [phone] Overall rating Much above average 39. 22.9 [place], [place] [address] [phone] Overall rating Much below average 40. 23.1 mi [facility] [address] [place], [place] [address] [phone] Overall rating Much below average 41. 24.4 mi [facility] [address] [place], [place] [address] [phone] Overall rating Below average 42. 24.5 [facility] [address] [place], [place] [address] [phone] Overall rating Much below average 43. 24.5 mi [place],  [address] [phone] Overall rating Below average 44. 24.9 mi [place],  [address] [phone] Overall rating Below average To explore and download nursing home data,visit the data catalog on     Expected Discharge Plan: [place] Barriers to Discharge: Insurance Authorization  Expected Discharge Plan and Services Expected Discharge Plan: [place]   Discharge Planning Services: CM Consult Post Acute Care Choice: [place] Living arrangements for the past 2 months: [place] Expected Discharge Date:  (unknown)                                     Social Determinants of Health (SDOH) Interventions    Readmission Risk Interventions Readmission Risk Prevention Plan 03/18/2020 10/21/2019  Transportation Screening Complete Complete  Medication Review Press photographer) - Complete  PCP or Specialist appointment within 3-5 days of discharge Not Complete Complete  PCP/Specialist Appt Not Complete comments First available appt 12/1 -  [place] or [place] Complete Complete  SW Recovery Care/Counseling Consult Complete Complete  Palliative Care Screening Not Applicable Not [place] Not Applicable Not Applicable  Some recent data might be hidden

## 2020-04-15 NOTE — Progress Notes (Signed)
This RN attempted to call report 3x to receiving facility, [facility], with no answer.

## 2020-04-16 ENCOUNTER — Encounter (HOSPITAL_COMMUNITY): Payer: Medicare Other

## 2020-04-16 ENCOUNTER — Encounter: Payer: Medicare Other | Admitting: Vascular Surgery

## 2020-04-16 LAB — GLUCOSE, CAPILLARY
Glucose-Capillary: 158 mg/dL — ABNORMAL HIGH (ref 70–99)
Glucose-Capillary: 200 mg/dL — ABNORMAL HIGH (ref 70–99)
Glucose-Capillary: 166 mg/dL — ABNORMAL HIGH (ref 70–99)
Glucose-Capillary: 177 mg/dL — ABNORMAL HIGH (ref 70–99)

## 2020-04-16 NOTE — TOC Progression Note (Addendum)
Transition of Care [facility]) - Progression Note    Patient Details  Name: Morgan Beasley MRN: [id number] Date of Birth: [birth year]  Transition of Care [facility]) CM/SW Contact  Jenniger Figiel, [name], RN Phone Number: 04/16/2020, 1:53 PM  Clinical Narrative:Faxed Psych's note to [facility]-await response.   2:29p-[facility] will watch patient over the weekend while in hospital & look @ taking her on Monday. MD updated.  3p-Spoke to dtr [name] updated her on current status-[facility] to review again on Monday if will take with: no behaviors,no sitter,no prn meds.Will need covid;she agree to take home if no SNF available. Alvis [facility]ings will review for HHPT-they have not accepted;dtr will take home to her house if needed.    Expected Discharge Plan: [place] Barriers to Discharge: Insurance Authorization  Expected Discharge Plan and Services Expected Discharge Plan: [place]   Discharge Planning Services: CM Consult Post Acute Care Choice: [place] Living arrangements for the past 2 months: [place] Expected Discharge Date: 04/15/20                                     Social Determinants of Health (SDOH) Interventions    Readmission Risk Interventions Readmission Risk Prevention Plan 03/18/2020 10/21/2019  Transportation Screening Complete Complete  Medication Review (RN Care Manager) - Complete  PCP or Specialist appointment within 3-5 days of discharge Not Complete Complete  PCP/Specialist Appt Not Complete comments First available appt 12/1 -  [place] or Home Care Consult Complete Complete  SW Recovery Care/Counseling Consult Complete Complete  Palliative Care Screening Not Applicable Not [place] Not Applicable Not Applicable  Some recent data might be hidden

## 2020-04-16 NOTE — TOC Progression Note (Addendum)
Transition of Care [facility]) - Progression Note    Patient Details  Name: Morgan Beasley MRN: [id number] Date of Birth: 1960/11/26  Transition of Care [facility]) CM/SW Contact  Ronella Plunk, [name], RN Phone Number: 04/16/2020, 9:20 AM  Clinical Narrative: Patient is being reviewed by SN[facility]-the concern is her psych issues-they are requesting a new psych note-MD/Nsg updated.  11:16a-Await current Psych note.     Expected Discharge Plan: [place] Barriers to Discharge: Insurance Authorization  Expected Discharge Plan and Services Expected Discharge Plan: [place]   Discharge Planning Services: CM Consult Post Acute Care Choice: [place] Living arrangements for the past 2 months: [place] Expected Discharge Date: 04/15/20                                     Social Determinants of Health (SDOH) Interventions    Readmission Risk Interventions Readmission Risk Prevention Plan 03/18/2020 10/21/2019  Transportation Screening Complete Complete  Medication Review (RN Care Manager) - Complete  PCP or Specialist appointment within 3-5 days of discharge Not Complete Complete  PCP/Specialist Appt Not Complete comments First available appt 12/1 -  [place] or Home Care Consult Complete Complete  SW Recovery Care/Counseling Consult Complete Complete  Palliative Care Screening Not Applicable Not [place] Not Applicable Not Applicable  Some recent data might be hidden

## 2020-04-16 NOTE — Progress Notes (Addendum)
Physical Therapy Treatment Patient Details Name: Morgan Beasley MRN: [id number] DOB: [birth year] Today's Date: 04/16/2020    History of Present Illness Pt admitted from home 03/21/20  and dx with acute respiratory failure 2* pleural effusions and pulmonary edema as well as acute metabolic encephalopathy and cerebral embolism with cerebral infarction.  Pt with hx of PAD, Lymphadema, DM, diabetic neuropathy, COPD, CKD, polysubstance abuse, bipolar, and fem-pop bypass graft.    PT Comments    Pt sitting EOB Indep resting/partially sleeping but easily aroused.  AxO x 1. I introduced my self and asked her "what are you doing?"  Pt responded "I'm make a bologna sandwich". Assisted with applying socks as pt was unable.  Pt moving slowly and sluggish but following all functional commands.  General transfer comment: hand held assist pt was slow to rise.  Gave pt a 2 step command "stand up and go to sink to wash hands".  Pt groggy/slow/delayed motor skills.  Cooperative. General Gait Details: slow/sluggish and delayed pt amb in hallway with MAX coaxing "I'm tired" shuffled steps and lateral drift with slow corrective reaction. "I just want to sleep". Pt was able to amb 75 feet hand held assist.   Returned to room and pt asked "where's my bologna sandwich?"  Assisted back to EOB and give her a diet coke and a [place] Sandwich.   Follow Up Recommendations  SNF     Equipment Recommendations  None recommended by PT    Recommendations for Other Services       Precautions / Restrictions Precautions Precautions: Fall Precaution Comments: none Restrictions Weight Bearing Restrictions: No    Mobility  Bed Mobility               General bed mobility comments: pt sitting EOB Indep slightly slumped over resting/partial sleeping but easily aroused.  Transfers Overall transfer level: Needs assistance Equipment used: 1 person hand held assist Transfers: Sit to/from [employer] Sit to Stand: Min assist Stand pivot transfers: Min assist       General transfer comment: hand held assist pt was slow to rise.  Gave pt a 2 step command "stand up and go to sink to wash hands".  Pt groggy/slow/delayed motor skills.  Cooperative.  Ambulation/Gait Ambulation/Gait assistance: Min assist;Mod assist Gait Distance (Feet): 75 Feet Assistive device: 1 person hand held assist Gait Pattern/deviations: Step-through pattern;Decreased stride length;Drifts right/left Gait velocity: reduced and variable   General Gait Details: slow/sluggish and delayed pt amb in hallway with MAX coaxing "I'm tired" shuffled steps and lateral drift with slow corrective reaction.   Stairs             Wheelchair Mobility    Modified Rankin (Stroke Patients Only)       Balance                                            Cognition Arousal/Alertness: Awake/alert Behavior During Therapy: Flat affect   Area of Impairment: Awareness;Safety/judgement;Following commands;Memory;Problem solving                 Orientation Level: Disoriented to;Time;Situation             General Comments: slow and sluggish but following all commands with coaxing      Exercises      General Comments        Pertinent Vitals/Pain Pain Assessment:  No/denies pain    Home Living                      Prior Function            PT Goals (current goals can now be found in the care plan section) Progress towards PT goals: Progressing toward goals    Frequency    Min 2X/week      PT Plan Current plan remains appropriate    Co-evaluation              AM-PAC PT "6 Clicks" Mobility   Outcome Measure  Help needed turning from your back to your side while in a flat bed without using bedrails?: A Little Help needed moving from lying on your back to sitting on the side of a flat bed without using bedrails?: A Little Help needed moving to and  from a bed to a chair (including a wheelchair)?: A Little Help needed standing up from a chair using your arms (e.g., wheelchair or bedside chair)?: A Little Help needed to walk in hospital room?: A Little Help needed climbing 3-5 steps with a railing? : A Lot 6 Click Score: 17    End of Session Equipment Utilized During Treatment: Gait belt Activity Tolerance: Patient limited by lethargy Patient left: in bed;with call bell/phone within reach Nurse Communication: Mobility status PT Visit Diagnosis: Muscle weakness (generalized) (M62.81);Unsteadiness on feet (R26.81);Pain     Time: 1405-1420 PT Time Calculation (min) (ACUTE ONLY): 15 min  Charges:  $Gait Training: 8-22 mins                     Rica Koyanagi  PTA Acute  Rehabilitation Services Pager      [phone] Office      [phone]

## 2020-04-16 NOTE — Progress Notes (Signed)
PROGRESS NOTE  Morgan Beasley TGY:[id number] DOB: 09-06-1960 DOA: 03/21/2020 PCP: Riki Sheer, NP   LOS: 26 days   Brief narrative: Morgan Beasley is a 59 y.o. female with past medical history of depression, bipolar disorder, substance abuse, pulmonary valve endocarditis, multiple CVAs presented to the hospital secondary to agitation, hallucinations, restlessness.  Patient was initially managed on Precedex drip in the ICU.  She found to have large pleural effusions with pulmonary edema as well.  Patient started on antipsychotics therapy with improvement of symptoms but still requiring inpatient behavioral health per psychiatry recommendations.  Assessment/Plan:  Principal Problem:   Acute metabolic encephalopathy Active Problems:   Peripheral neuropathy   COPD (chronic obstructive pulmonary disease) (HCC)   Diabetic peripheral neuropathy associated with type 2 diabetes mellitus ([place])   Cerebral embolism with cerebral infarction   Mixed hyperlipidemia  Psychosis, delirium, bipolar disorder On Elavil, BuSpar, Tegretol, Zyprexa. Initial plan was for inpatient geri psych admission but now patient is okay to discharge to SNF. Off sitter.   As per the transition of care, skilled nursing facility now requesting psychiatric reevaluation for stability to transfer to psych at this time.  Have consulted psychiatry  Acute respiratory failure with hypoxia Resolved at this time.  Currently on room air.  Initially thought to be secondary to pulmonary edema from acute CHF with underlying obstructive sleep apnea and COPD.   Acute systolic heart failure 2D echocardiogram from 03/2020 significant for an EF of 40% with associated moderately reduced right ventricular systolic function with left ventricular enlargement patient initially received IV Lasix.  Currently euvolemic and is on metoprolol and oral Lasix   Possible obstructive sleep apnea Patient noted to have intermittent apneic  spells while sleeping. Recommendation for outpatient sleep study.  Has remained stable.  History of CVA Imaging of the brain showed right/left occipital infarcts in addition to left basal ganglia/thalamic microhemorahages.  Plan is to continue Plavix and Lipitor on discharge  Diabetes mellitus, type II -Controlled at this time.  Continue lantus and sliding scale insulin, diabetic diet.  Closely monitor.  Chronic pancreatitis -Continue Creon  Acute kidney injury on CKD stage IIIa Stable from recent labs.  Anemia of chronic disease Currently stable. Iron panel is unremarkable.  Right supraclavicular/mediastinal adenopathy  history of breast cancer treated with mastectomy, will need outpatient oncology follow-up.  Decreased pedal pulses/Multiple LE wounds secondary to peripheral vascular disease.  Right leg pain. Follows up with vascular surgery as outpatient.  Continue Plavix, Lipitor.  X-ray of the tibia-fibula on 12/11 was negative for acute fracture.  Recent serratia bacteremia Was on cefepime initially.  Off antibiotic at this time   DVT prophylaxis: SCDs Start: 03/21/20 1629  Code Status: Full code  Family Communication: None today. Status is: Inpatient  Remains inpatient appropriate because:Need for skilled nursing facility placement,   Dispo: The patient is from: Home              Anticipated d/c is to: SNF              Anticipated d/c date is: 1 to 2 days awaiting psychiatry consultation, skilled nursing facility placement.              Patient currently is medically stable to d/c.  Spoke at the interdisciplinary meeting  Consultants:  PCCM  ID  Psychiatry  Procedures:  None  Antibiotics:  . None at this time  Anti-infectives (From admission, onward)   Start     Dose/Rate Route Frequency Ordered  Stop   03/21/20 1800  ceFEPIme (MAXIPIME) 2 g in sodium chloride 0.9 % 100 mL IVPB        2 g 200 mL/hr over 30 Minutes Intravenous Every 8 hours  03/21/20 1621 03/22/20 1332       Subjective: Today, and examined at bedside.  Patient was mildly sleepy.  Denies any nausea vomiting shortness of breath.  No bedside sitter noted   Objective: Vitals:   04/15/20 1127 04/15/20 2210  BP: (!) 142/90 (!) 157/102  Pulse: (!) 109 (!) 104  Resp: 20 16  Temp: 97.7 F (36.5 C) 98.9 F (37.2 C)  SpO2: 99% 95%    Intake/Output Summary (Last 24 hours) at 04/16/2020 1141 Last data filed at 04/15/2020 1730 Gross per 24 hour  Intake 480 ml  Output --  Net 480 ml   Filed Weights   04/10/20 0419 04/12/20 0500 04/13/20 0627  Weight: 89.4 kg 90.2 kg 89.1 kg   Body mass index is 31.7 kg/m.   Physical Exam: General: Obese built, not in obvious distress, alert awake and communicative HENT:   No scleral pallor or icterus noted. Oral mucosa is moist.  Chest: Diminished breath sounds bilaterally. CVS: S1 &S2 heard. No murmur.  Regular rate and rhythm. Abdomen: Soft, nontender, nondistended.  Bowel sounds are heard.   Extremities: No cyanosis, clubbing or edema.  Peripheral pulses are palpable. Psych: Alert, awake and communicative, flat affect, anxious at times CNS:  No cranial nerve deficits.  Moving all extremities. Skin: Warm and dry.  No rashes noted.  Data Review: I have personally reviewed the following laboratory data and studies,  CBC: No results for input(s): WBC, NEUTROABS, HGB, HCT, MCV, PLT in the last 168 hours. Basic Metabolic Panel: No results for input(s): NA, K, CL, CO2, GLUCOSE, BUN, CREATININE, CALCIUM, MG, PHOS in the last 168 hours. Liver Function Tests: No results for input(s): AST, ALT, ALKPHOS, BILITOT, PROT, ALBUMIN in the last 168 hours. No results for input(s): LIPASE, AMYLASE in the last 168 hours. No results for input(s): AMMONIA in the last 168 hours. Cardiac Enzymes: No results for input(s): CKTOTAL, CKMB, CKMBINDEX, TROPONINI in the last 168 hours. BNP (last 3 results) Recent Labs    03/04/20 0537  03/21/20 0748 03/24/20 0156  BNP 2,502.5* 1,352.8* 989.2*    ProBNP (last 3 results) Recent Labs    12/19/19 1546 02/09/20 0825  PROBNP 1,148* 1,185*    CBG: Recent Labs  Lab 04/15/20 1125 04/15/20 1714 04/15/20 2208 04/16/20 0734 04/16/20 1132  GLUCAP 165* 180* 178* 158* 200*   Recent Results (from the past 240 hour(s))  Resp Panel by RT-PCR (Flu A&B, Covid) Nasopharyngeal Swab     Status: None   Collection Time: 04/15/20 10:20 AM   Specimen: Nasopharyngeal Swab; Nasopharyngeal(NP) swabs in vial transport medium  Result Value Ref Range Status   SARS Coronavirus 2 by RT PCR NEGATIVE NEGATIVE Final    Comment: (NOTE) SARS-CoV-2 target nucleic acids are NOT DETECTED.  The SARS-CoV-2 RNA is generally detectable in upper respiratory specimens during the acute phase of infection. The lowest concentration of SARS-CoV-2 viral copies this assay can detect is 138 copies/mL. A negative result does not preclude SARS-Cov-2 infection and should not be used as the sole basis for treatment or other patient management decisions. A negative result may occur with  improper specimen collection/handling, submission of specimen other than nasopharyngeal swab, presence of viral mutation(s) within the areas targeted by this assay, and inadequate number of viral copies(<138 copies/mL).  A negative result must be combined with clinical observations, patient history, and epidemiological information. The expected result is Negative.  Fact Sheet for Patients:  EntrepreneurPulse.com.au  Fact Sheet for Healthcare Providers:  IncredibleEmployment.be  This test is no t yet approved or cleared by the [place] FDA and  has been authorized for detection and/or diagnosis of SARS-CoV-2 by FDA under an Emergency Use Authorization (EUA). This EUA will remain  in effect (meaning this test can be used) for the duration of the COVID-19 declaration under Section  564(b)(1) of the Act, 21 U.S.C.section 360bbb-3(b)(1), unless the authorization is terminated  or revoked sooner.       Influenza A by PCR NEGATIVE NEGATIVE Final   Influenza B by PCR NEGATIVE NEGATIVE Final    Comment: (NOTE) The Xpert Xpress SARS-CoV-2/FLU/RSV plus assay is intended as an aid in the diagnosis of influenza from Nasopharyngeal swab specimens and should not be used as a sole basis for treatment. Nasal washings and aspirates are unacceptable for Xpert Xpress SARS-CoV-2/FLU/RSV testing.  Fact Sheet for Patients: EntrepreneurPulse.com.au  Fact Sheet for Healthcare Providers: IncredibleEmployment.be  This test is not yet approved or cleared by the [place] FDA and has been authorized for detection and/or diagnosis of SARS-CoV-2 by FDA under an Emergency Use Authorization (EUA). This EUA will remain in effect (meaning this test can be used) for the duration of the COVID-19 declaration under Section 564(b)(1) of the Act, 21 U.S.C. section 360bbb-3(b)(1), unless the authorization is terminated or revoked.  Performed at [facility], [place] [address], [place], [place] [address]      Studies: No results found.    Flora Lipps, MD  [facility] 04/16/2020  If 7PM-7AM, please contact night-coverage

## 2020-04-16 NOTE — Consult Note (Signed)
[facility] Psychiatry Consult   Reason for Consult: Psych consult for admission to SNF Referring Physician: Dr. Louanne Belton Patient Identification: Morgan Beasley MRN:  [id number] Principal Diagnosis: Acute metabolic encephalopathy Diagnosis:  Principal Problem:   Acute metabolic encephalopathy Active Problems:   Peripheral neuropathy   COPD (chronic obstructive pulmonary disease) ([place])   Diabetic peripheral neuropathy associated with type 2 diabetes mellitus ([place])   Cerebral embolism with cerebral infarction   Mixed hyperlipidemia   Total Time spent with patient: 30 minutes  Subjective:   Morgan Beasley is a 59 y.o. female patient admitted with acute metabolic encephalopathy.  Patient has been being followed by psychiatric service throughout current duration of inpatient stay for 26 days. On today's evaluation patient is observed to be sitting upright on the side of the bed eating crackers. She does not have a sitter at bedside. She appears to be responding to commands unassisted, and she answers questions appropriately. She appears to doze off multiple times throughout the evaluation, although she is sitting on the edge of the bed. She was awaken and resumes her sentence. She is alert and oriented x 2. She shows marked improvement in her mental status, at this time as she is able to answer some questions. She does not appear to show any agitation or aggression, behavioral disturbances at the time. She remains on BuSpar 10 mg p.o. 3 times daily, olanzapine 5 mg p.o. nightly, Tegretol 400 mg p.o. twice daily.  Recent carbamazepine level obtained on 12/10 appears to be increasing and responding appropriately, today's level was 7.0.  She has not received any additional chemical restraints in almost 7 days.  Patient with improvement in her speech, and is able to hold a conversation and answers all questions appropriately.  She does not appear to be responding to internal stimuli, nor  is she presenting with delusional thinking, psychosis or hallucinations.  Furthermore she denies any active suicidal thoughts, homicidal thoughts and or auditory visual hallucinations.   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  She was found to have pleural effusions, pulmonary edema.  Waxing and waning agitation delirium requiring Precedex in ICU. 11/23, Precedex off.  On multiple antipsychotic medications.  Remains agitated intermittently.  Patient has history of depression, bipolar disorder, cocaine use and alcoholism.  She was even found with alcohol with her when she was discharged from hospital with complicated hospitalization. Hospital course is complicated with ongoing intermittent agitation and underlying behavioral issues.  Past Psychiatric History: Anxiety and depression.  Currently taking amitriptyline being prescribed by her primary care provider.  She denies previous psychiatric diagnosis.  She denies previous inpatient admission.  She does not appear to be open to psychiatric services at this time.   Risk to Self:   Risk to Others:   Prior Inpatient Therapy:   Prior Outpatient Therapy:    Past Medical History:  Past Medical History:  Diagnosis Date  . Alcohol dependence ([place])   . Anemia   . Anxiety   . Breast cancer ([place])   . Chronic combined systolic and diastolic CHF (congestive heart failure) ([place])   . Cigarette nicotine dependence   . CKD (chronic kidney disease), stage III ([place])   . Colon polyps   . COPD (chronic obstructive pulmonary disease) ([place])   .  Diabetes mellitus without complication ([place])   . Diabetic neuropathy ([place])   . Gout   . Hyperlipemia    . Hypertension   . Insomnia   . Lymphedema   . Marijuana use   . Mild CAD 2016   a. NSTEMI 2016 in context of cocaine abuse, 50% RCA% at that time.  . OSA treated with BiPAP   . PAD (peripheral artery disease) ([place])    a. s/p L SFA stenting 08/2019. b. left fem-to-below-knee-popliteal bypass 09/2019.  Marland Kitchen Pancreatitis    acute on chronic due to ETOH initially.    . Sleep apnea    wears BIPAP  . Ulcer of foot ([place])    right    Past Surgical History:  Procedure Laterality Date  . ABDOMINAL AORTOGRAM W/LOWER EXTREMITY N/A 09/26/2019   Procedure: ABDOMINAL AORTOGRAM W/LOWER EXTREMITY;  Surgeon: Angelia Mould, MD;  Location: [facility];  Service: Cardiovascular;  Laterality: N/A;  . ABDOMINAL AORTOGRAM W/LOWER EXTREMITY Bilateral 12/08/2019   Procedure: ABDOMINAL AORTOGRAM W/LOWER EXTREMITY;  Surgeon: Waynetta Sandy, MD;  Location: [facility];  Service: Cardiovascular;  Laterality: Bilateral;  . ABDOMINAL AORTOGRAM W/LOWER EXTREMITY Left 01/26/2020   Procedure: ABDOMINAL AORTOGRAM W/LOWER EXTREMITY;  Surgeon: Waynetta Sandy, MD;  Location: [facility];  Service: Cardiovascular;  Laterality: Left;  . ABDOMINAL HYSTERECTOMY    . BUBBLE STUDY  03/04/2020   Procedure: BUBBLE STUDY;  Surgeon: Elouise Munroe, MD;  Location[facility];  Service: Cardiology;;  . [place] [facility]  . FEMORAL-POPLITEAL BYPASS GRAFT Left 10/14/2019   Procedure: BYPASS GRAFT LEFT FEMORAL-POPLITEAL ARTERY USING NONREVERSED SAPHENOUS VEIN;  Surgeon: Waynetta Sandy, MD;  Location: [facility];  Service: Vascular;  Laterality: Left;  Marland Kitchen MASTECTOMY Right April 2016  . PERIPHERAL VASCULAR ATHERECTOMY Left 01/26/2020   Procedure: PERIPHERAL VASCULAR ATHERECTOMY;  Surgeon: Waynetta Sandy, MD;  Location: [facility];  Service: Cardiovascular;  Laterality: Left;  PT and AT - [facility]  . PERIPHERAL VASCULAR BALLOON ANGIOPLASTY Left 01/26/2020    Procedure: PERIPHERAL VASCULAR BALLOON ANGIOPLASTY;  Surgeon: Waynetta Sandy, MD;  Location: [facility];  Service: Cardiovascular;  Laterality: Left;  [facility]   . PERIPHERAL VASCULAR INTERVENTION Left 09/26/2019   Procedure: PERIPHERAL VASCULAR INTERVENTION;  Surgeon: Angelia Mould, MD;  Location: [facility];  Service: Cardiovascular;  Laterality: Left;  superficial femoral  . TEE WITHOUT CARDIOVERSION N/A 03/04/2020   Procedure: TRANSESOPHAGEAL ECHOCARDIOGRAM (TEE);  Surgeon: Elouise Munroe, MD;  Location: [facility];  Service: Cardiology;  Laterality: N/A;   Family History:  Family History  Problem Relation Age of Onset  . Diabetes Other   . Heart disease Other   . Breast cancer Maternal Grandmother   . Breast cancer Paternal Grandmother   . Stroke Son   . Colon cancer Neg Hx   . Esophageal cancer Neg Hx   . Rectal cancer Neg Hx   . Stomach cancer Neg Hx    Family Psychiatric  History:  Social History:  Social History   Substance and Sexual Activity  Alcohol Use Not Currently   Comment: Beer - "on the weekends hanging out, maybe 2 or 3"      Social History   Substance and Sexual Activity  Drug Use Not Currently  . Types: Marijuana   Comment: last used 8 2020    Social History   Socioeconomic History  . Marital status: Single  Spouse name: Not on file  . Number of children: Not on file  . Years of education: Not on file  . Highest education level: Not on file  Occupational History  . Not on file  Tobacco Use  . Smoking status: Light Tobacco Smoker    Types: Cigarettes  . Smokeless tobacco: Never Used  . Tobacco comment: 3 cigarettes a day  Vaping Use  . Vaping Use: Never used  Substance and Sexual Activity  . Alcohol use: Not Currently    Comment: Beer - "on the weekends hanging out, maybe 2 or 3"   . Drug use: Not Currently    Types: Marijuana    Comment: last used 8 2020  . Sexual activity: Not on file  Other  Topics Concern  . Not on file  Social History Narrative  . Not on file   Social Determinants of Health   Financial Resource Strain: Not on file  Food Insecurity: Not on file  Transportation Needs: Not on file  Physical Activity: Not on file  Stress: Not on file  Social Connections: Not on file   Additional Social History:    Allergies:   Allergies  Allergen Reactions  . Tramadol Swelling  . Nsaids Other (See Comments)    Pancreatitis  . Tolmetin Other (See Comments)    Pancreatitis  . Tylenol [Acetaminophen] Other (See Comments)    unknown  . Aspirin Other (See Comments)    "Makes my pancreas act up"     Labs:  Results for orders placed or performed during the hospital encounter of 03/21/20 (from the past 48 hour(s))  Glucose, capillary     Status: Abnormal   Collection Time: 04/14/20  4:24 PM  Result Value Ref Range   Glucose-Capillary 191 (H) 70 - 99 mg/dL    Comment: Glucose reference range applies only to samples taken after fasting for at least 8 hours.  Glucose, capillary     Status: Abnormal   Collection Time: 04/14/20  9:27 PM  Result Value Ref Range   Glucose-Capillary 186 (H) 70 - 99 mg/dL    Comment: Glucose reference range applies only to samples taken after fasting for at least 8 hours.  Glucose, capillary     Status: Abnormal   Collection Time: 04/15/20  7:43 AM  Result Value Ref Range   Glucose-Capillary 209 (H) 70 - 99 mg/dL    Comment: Glucose reference range applies only to samples taken after fasting for at least 8 hours.  Resp Panel by RT-PCR (Flu A&B, Covid) Nasopharyngeal Swab     Status: None   Collection Time: 04/15/20 10:20 AM   Specimen: Nasopharyngeal Swab; Nasopharyngeal(NP) swabs in vial transport medium  Result Value Ref Range   SARS Coronavirus 2 by RT PCR NEGATIVE NEGATIVE    Comment: (NOTE) SARS-CoV-2 target nucleic acids are NOT DETECTED.  The SARS-CoV-2 RNA is generally detectable in upper respiratory specimens during the  acute phase of infection. The lowest concentration of SARS-CoV-2 viral copies this assay can detect is 138 copies/mL. A negative result does not preclude SARS-Cov-2 infection and should not be used as the sole basis for treatment or other patient management decisions. A negative result may occur with  improper specimen collection/handling, submission of specimen other than nasopharyngeal swab, presence of viral mutation(s) within the areas targeted by this assay, and inadequate number of viral copies(<138 copies/mL). A negative result must be combined with clinical observations, patient history, and epidemiological information. The expected result is Negative.  Fact  Sheet for Patients:  EntrepreneurPulse.com.au  Fact Sheet for Healthcare Providers:  IncredibleEmployment.be  This test is no t yet approved or cleared by the [place] FDA and  has been authorized for detection and/or diagnosis of SARS-CoV-2 by FDA under an Emergency Use Authorization (EUA). This EUA will remain  in effect (meaning this test can be used) for the duration of the COVID-19 declaration under Section 564(b)(1) of the Act, 21 U.S.C.section 360bbb-3(b)(1), unless the authorization is terminated  or revoked sooner.       Influenza A by PCR NEGATIVE NEGATIVE   Influenza B by PCR NEGATIVE NEGATIVE    Comment: (NOTE) The Xpert Xpress SARS-CoV-2/FLU/RSV plus assay is intended as an aid in the diagnosis of influenza from Nasopharyngeal swab specimens and should not be used as a sole basis for treatment. Nasal washings and aspirates are unacceptable for Xpert Xpress SARS-CoV-2/FLU/RSV testing.  Fact Sheet for Patients: EntrepreneurPulse.com.au  Fact Sheet for Healthcare Providers: IncredibleEmployment.be  This test is not yet approved or cleared by the [place] FDA and has been authorized for detection and/or diagnosis of  SARS-CoV-2 by FDA under an Emergency Use Authorization (EUA). This EUA will remain in effect (meaning this test can be used) for the duration of the COVID-19 declaration under Section 564(b)(1) of the Act, 21 U.S.C. section 360bbb-3(b)(1), unless the authorization is terminated or revoked.  Performed at [facility], [place] [address], [place], [place] [address]   Glucose, capillary     Status: Abnormal   Collection Time: 04/15/20 11:25 AM  Result Value Ref Range   Glucose-Capillary 165 (H) 70 - 99 mg/dL    Comment: Glucose reference range applies only to samples taken after fasting for at least 8 hours.  Glucose, capillary     Status: Abnormal   Collection Time: 04/15/20  5:14 PM  Result Value Ref Range   Glucose-Capillary 180 (H) 70 - 99 mg/dL    Comment: Glucose reference range applies only to samples taken after fasting for at least 8 hours.  Glucose, capillary     Status: Abnormal   Collection Time: 04/15/20 10:08 PM  Result Value Ref Range   Glucose-Capillary 178 (H) 70 - 99 mg/dL    Comment: Glucose reference range applies only to samples taken after fasting for at least 8 hours.  Glucose, capillary     Status: Abnormal   Collection Time: 04/16/20  7:34 AM  Result Value Ref Range   Glucose-Capillary 158 (H) 70 - 99 mg/dL    Comment: Glucose reference range applies only to samples taken after fasting for at least 8 hours.  Glucose, capillary     Status: Abnormal   Collection Time: 04/16/20 11:32 AM  Result Value Ref Range   Glucose-Capillary 200 (H) 70 - 99 mg/dL    Comment: Glucose reference range applies only to samples taken after fasting for at least 8 hours.    Current Facility-Administered Medications  Medication Dose Route Frequency Provider Last Rate Last Admin  . acetaminophen (TYLENOL) tablet 650 mg  650 mg Oral Q6H Barb Merino, MD   650 mg at 04/16/20 1214  . albuterol (VENTOLIN HFA) 108 (90 Base) MCG/ACT inhaler 2 puff  2 puff Inhalation  Q6H PRN Marylyn Ishihara, Tyrone A, DO      . amitriptyline (ELAVIL) tablet 100 mg  100 mg Oral QHS Pokhrel, Laxman, MD   100 mg at 04/15/20 2209  . atorvastatin (LIPITOR) tablet 20 mg  20 mg Oral Daily Kyle, Tyrone A, DO  20 mg at 04/16/20 0950  . busPIRone (BUSPAR) tablet 10 mg  10 mg Oral TID Suella Broad, FNP   10 mg at 04/16/20 0950  . carbamazepine (TEGRETOL) tablet 400 mg  400 mg Oral BID Suella Broad, FNP   400 mg at 04/16/20 0950  . chlorhexidine (PERIDEX) 0.12 % solution 15 mL  15 mL Mouth Rinse BID Marylyn Ishihara, Tyrone A, DO   15 mL at 04/15/20 2209  . clopidogrel (PLAVIX) tablet 75 mg  75 mg Oral Daily Kyle, Tyrone A, DO   75 mg at 04/16/20 0950  . collagenase (SANTYL) ointment   Topical Daily Patrecia Pour, MD   Given at 04/15/20 1547  . docusate sodium (COLACE) capsule 100 mg  100 mg Oral BID Cristal Generous, NP   100 mg at 04/16/20 0950  . furosemide (LASIX) tablet 40 mg  40 mg Oral Daily Barb Merino, MD   40 mg at 04/16/20 0950  . haloperidol lactate (HALDOL) injection 2 mg  2 mg Intramuscular Once Lovey Newcomer T, NP      . hydrocortisone cream 1 % 1 application  1 application Topical TID PRN Barb Merino, MD   1 application at 53/97/67 0430  . insulin aspart (novoLOG) injection 0-5 Units  0-5 Units Subcutaneous QHS Patrecia Pour, MD   2 Units at 04/13/20 2203  . insulin aspart (novoLOG) injection 0-6 Units  0-6 Units Subcutaneous TID WC Patrecia Pour, MD   1 Units at 04/16/20 1213  . insulin glargine (LANTUS) injection 10 Units  10 Units Subcutaneous QHS Patrecia Pour, MD   10 Units at 04/15/20 2210  . lipase/protease/amylase (CREON) capsule 36,000 Units  36,000 Units Oral TID PRN Minda Ditto, RPH   36,000 Units at 03/30/20 1038  . lipase/protease/amylase (CREON) capsule 72,000 Units  72,000 Units Oral TID WC Minda Ditto, RPH   72,000 Units at 04/16/20 1214  . MEDLINE mouth rinse  15 mL Mouth Rinse q12n4p Kyle, Tyrone A, DO   15 mL at 04/12/20 1241  . metoprolol  succinate (TOPROL-XL) 24 hr tablet 50 mg  50 mg Oral Daily Kyle, Tyrone A, DO   50 mg at 04/16/20 0951  . mometasone-formoterol (DULERA) 200-5 MCG/ACT inhaler 2 puff  2 puff Inhalation BID Marylyn Ishihara, Tyrone A, DO   2 puff at 04/14/20 0732  . nicotine (NICODERM CQ - dosed in mg/24 hours) patch 14 mg  14 mg Transdermal Daily Barb Merino, MD   14 mg at 04/16/20 0954  . OLANZapine (ZYPREXA) tablet 5 mg  5 mg Oral QHS Barb Merino, MD   5 mg at 04/15/20 2209  . ondansetron (ZOFRAN) injection 4 mg  4 mg Intravenous Q6H PRN Pokhrel, Laxman, MD   4 mg at 04/14/20 1148  . ondansetron (ZOFRAN-ODT) disintegrating tablet 4 mg  4 mg Oral Q8H PRN Lang Snow, FNP   4 mg at 04/15/20 2311  . polyethylene glycol (MIRALAX / GLYCOLAX) packet 17 g  17 g Oral Daily Barb Merino, MD   17 g at 04/16/20 0948  . simethicone (MYLICON) chewable tablet 80 mg  80 mg Oral QID PRN Cristal Generous, NP   80 mg at 04/13/20 2305  . sodium chloride flush (NS) 0.9 % injection 10-40 mL  10-40 mL Intracatheter PRN Deno Etienne, DO        Musculoskeletal: Strength & Muscle Tone: within normal limits Gait & Station: normal Patient leans: N/A  Psychiatric Specialty Exam: Physical Exam  Review of Systems   Blood pressure (!) 145/88, pulse 97, temperature 98.1 F (36.7 C), temperature source Oral, resp. rate 20, height 5\' 6"  (1.676 m), weight 89.1 kg, SpO2 97 %.Body mass index is 31.7 kg/m.  General Appearance: Casual  Eye Contact:  Fair  Speech:  Clear and Coherent and Normal Rate  Volume:  Normal  Mood:  Anxious  Affect:  Constricted  Thought Process:  Coherent, Linear and Descriptions of Associations: Intact  Orientation:  Other:  person and palce  Thought Content:  Logical  Suicidal Thoughts:  denies  Homicidal Thoughts:  Denies  Memory:  Immediate;   Fair Recent;   Poor Remote;   Poor  Judgement:  Other:  Improving  Insight:  Remains shallow yet improving  Psychomotor Activity:  Normal  Concentration:   Concentration: Fair and Attention Span: Fair  Recall:  Poor  Fund of Knowledge:  Poor  Language:  Poor  Akathisia:  Negative  Handed:  Right  AIMS (if indicated):     Assets:  Communication Skills Desire for Improvement Financial Resources/Insurance Housing Leisure Time Social Support  ADL's:  Impaired  Cognition:  Impaired,  Mild  Sleep:      Morgan Beasley is a 59 year old female with significant medical history to include cerebral edema, bipolar, polysubstance abuse, diabetes, cardiovascular disease who presents with altered mental status.  Patient is seen and observed to be sitting up in bed, with no sitter at her bedside. This is a sign of marked improvement as patient has not required chemical restraints or close observation sitter. Patient is not exhibiting any behaviors of disruption, agitation, aggression, and or combativeness. She is currently taking amitriptyline 25 mg p.o. nightly, buspirone 10 mg p.o. twice daily, Tegretol 400 mg p.o. twice daily, olanzapine 5 mg p.o. nightly.  Patient denies any suicidality, homicidality, and or auditory visual hallucinations.    Treatment Plan Summary: Plan Continue current medications to include Tegretol 400mg  po BID for aggression, agitation, and confusion. Patient carbamazepine level is increasing, although not at goal. She shows modest improvement on this dose of medication. Will continue olanzapin 5mg  po qhs.    -Will psych clear at this time.    Disposition: No evidence of imminent risk to self or others at present.   Patient does not meet criteria for psychiatric inpatient admission.  Suella Broad, FNP 04/16/2020 12:16 PM

## 2020-04-16 NOTE — Progress Notes (Signed)
OT Cancellation Note  Patient Details Name: Morgan Beasley MRN: [id number] DOB: 11-Jul-1960   Cancelled Treatment:    Reason Eval/Treat Not Completed: Other (comment) Upon arrival patient working with physical therapy, will re-attempt at later date.  Delbert Phenix OT OT pager: [phone]   Rosemary Holms 04/16/2020, 2:28 PM

## 2020-04-17 LAB — GLUCOSE, CAPILLARY
Glucose-Capillary: 164 mg/dL — ABNORMAL HIGH (ref 70–99)
Glucose-Capillary: 170 mg/dL — ABNORMAL HIGH (ref 70–99)
Glucose-Capillary: 164 mg/dL — ABNORMAL HIGH (ref 70–99)
Glucose-Capillary: 157 mg/dL — ABNORMAL HIGH (ref 70–99)

## 2020-04-17 LAB — BASIC METABOLIC PANEL
Anion gap: 13 (ref 5–15)
BUN: 21 mg/dL — ABNORMAL HIGH (ref 6–20)
CO2: 28 mmol/L (ref 22–32)
Calcium: 8.1 mg/dL — ABNORMAL LOW (ref 8.9–10.3)
Chloride: 96 mmol/L — ABNORMAL LOW (ref 98–111)
Creatinine, Ser: 1.31 mg/dL — ABNORMAL HIGH (ref 0.44–1.00)
GFR, Estimated: 47 mL/min — ABNORMAL LOW (ref >60)
Glucose, Bld: 175 mg/dL — ABNORMAL HIGH (ref 70–99)
Potassium: 3.1 mmol/L — ABNORMAL LOW (ref 3.5–5.1)
Sodium: 137 mmol/L (ref 135–145)

## 2020-04-17 MED ORDER — POTASSIUM CHLORIDE CRYS ER 20 MEQ PO TBCR
40.0000 meq | EXTENDED_RELEASE_TABLET | Freq: Two times a day (BID) | ORAL | Status: AC
  Administered 2020-04-17 – 2020-04-18 (×2): 40 meq via ORAL
  Filled 2020-04-17 (×3): qty 2

## 2020-04-17 MED ORDER — OLANZAPINE 10 MG IM SOLR
10.0000 mg | Freq: Once | INTRAMUSCULAR | Status: AC
  Administered 2020-04-17: 10 mg via INTRAMUSCULAR
  Filled 2020-04-17: qty 10

## 2020-04-17 NOTE — Progress Notes (Signed)
Occupational Therapy Treatment Patient Details Name: Morgan Beasley MRN: [id number] DOB: 07-08-1960 Today's Date: 04/17/2020    History of present illness Pt admitted from home 03/21/20  and dx with acute respiratory failure 2* pleural effusions and pulmonary edema as well as acute metabolic encephalopathy and cerebral embolism with cerebral infarction.  Pt with hx of PAD, Lymphadema, DM, diabetic neuropathy, COPD, CKD, polysubstance abuse, bipolar, and fem-pop bypass graft.   OT comments  Patient's behaviors and cognition improving. Patient now without Air cabin crew. Patient sitting in recliner when therapist entered the room. Patient needed assistance to don socks but demonstrates the ability to assist with pulling up clothing from knees. Patient reports foot pain and right foot edematous. Patient min assist to stand from recliner (needing two attempts to rise due to a posterior lean) with RW. Patient ambulated to door and back to chair with min guard. Patient's attention on bags on bed and wanting therapist to put bags in a closet "over there." She can today be redirected and participate in ambulation task prior to returning to asking therapist to move bags. Patient exhibits improving mental status and ability to follow one steps commands - hope to gradually progress towards meeting goals.     Follow Up Recommendations  SNF    Equipment Recommendations  None recommended by OT    Recommendations for Other Services      Precautions / Restrictions Precautions Precautions: Fall Restrictions Weight Bearing Restrictions: No Other Position/Activity Restrictions: Reports Left foot pain, Right foot swollen       Mobility Bed Mobility               General bed mobility comments: up in recliner  Transfers Overall transfer level: Needs assistance Equipment used: Rolling walker (2 wheeled) Transfers: Sit to/from [facility] Sit to Stand: Min assist Stand pivot  transfers: Min guard       General transfer comment: Min assist to stand from recliner. Required two attempts as patient leaning backwards. Min guard to ambulate to the door and back to recliner.    Balance Overall balance assessment: Needs assistance Sitting-balance support: No upper extremity supported;Feet supported;Feet unsupported Sitting balance-Leahy Scale: Fair     Standing balance support: Bilateral upper extremity supported;During functional activity Standing balance-Leahy Scale: Fair Standing balance comment: using RW with ambulation. can take hands off of walker at times.                           ADL either performed or assessed with clinical judgement   ADL                       Lower Body Dressing: Maximal assistance;Sit to/from stand Lower Body Dressing Details (indicate cue type and reason): Needs assistance to don both socks, patient did not attempt. Limited by foot pain today.                     Vision   Vision Assessment?: No apparent visual deficits Additional Comments: visual hallucinations   Perception     Praxis      Cognition Arousal/Alertness: Suspect due to medications ("sleepy") Behavior During Therapy: Flat affect Overall Cognitive Status: Impaired/Different from baseline Area of Impairment: Awareness;Safety/judgement;Following commands;Memory;Problem solving                 Orientation Level: Disoriented to;Situation;Place;Time Current Attention Level: Sustained Memory: Decreased recall of precautions;Decreased short-term memory Following Commands: Follows  one step commands consistently Safety/Judgement: Decreased awareness of safety;Decreased awareness of deficits Awareness: Emergent Problem Solving: Decreased initiation;Requires verbal cues;Requires tactile cues General Comments: Improving cognition and ability to participate. Patient continues to have slow processing (medication related?), able to  follow commands with repeated verbal cues to maintain her attention.        Exercises     Shoulder Instructions       General Comments      Pertinent Vitals/ Pain       Pain Assessment: Faces Faces Pain Scale: Hurts a little bit Pain Location: Left foot Pain Descriptors / Indicators: Grimacing Pain Intervention(s): Monitored during session  Home Living                                          Prior Functioning/Environment              Frequency  Min 2X/week        Progress Toward Goals  OT Goals(current goals can now be found in the care plan section)  Progress towards OT goals: Progressing toward goals  Acute Rehab OT Goals OT Goal Formulation: Patient unable to participate in goal setting Time For Goal Achievement: 05/01/20 Potential to Achieve Goals: [place] Discharge plan remains appropriate;Frequency remains appropriate    Co-evaluation          OT goals addressed during session: ADL's and self-care (functional mobility)      AM-PAC OT "6 Clicks" Daily Activity     Outcome Measure   Help from another person eating meals?: A Little Help from another person taking care of personal grooming?: A Little Help from another person toileting, which includes using toliet, bedpan, or urinal?: A Lot Help from another person bathing (including washing, rinsing, drying)?: A Lot Help from another person to put on and taking off regular upper body clothing?: A Lot Help from another person to put on and taking off regular lower body clothing?: A Lot 6 Click Score: 14    End of Session Equipment Utilized During Treatment: Rolling walker  OT Visit Diagnosis: Other abnormalities of gait and mobility (R26.89);Muscle weakness (generalized) (M62.81);Other symptoms and signs involving cognitive function Pain - Right/Left: Left Pain - part of body: Leg;Ankle and joints of foot   Activity Tolerance Patient tolerated treatment well   Patient  Left in chair;with call bell/phone within reach;with chair alarm set   Nurse Communication Mobility status        Time: 1191-4782 OT Time Calculation (min): 12 min  Charges: OT General Charges $OT Visit: 1 Visit OT Treatments $Therapeutic Activity: 8-22 mins  Ericha Whittingham, OTR/L [place]  Office [phone] Pager: [place] 04/17/2020, 12:26 PM

## 2020-04-17 NOTE — Progress Notes (Addendum)
PROGRESS NOTE  Morgan Beasley NLG:[id number] DOB: 1960/09/20 DOA: 03/21/2020 PCP: Riki Sheer, NP   LOS: 27 days   Brief narrative: Morgan Beasley is a 59 y.o. female with past medical history of depression, bipolar disorder, substance abuse, pulmonary valve endocarditis, multiple CVAs presented to the hospital secondary to agitation, hallucinations, restlessness.  Patient was initially managed on Precedex drip in the ICU.  She found to have large pleural effusions with pulmonary edema as well.  Patient started on antipsychotics therapy with improvement of symptoms and is currently off one-to-one sitter. Awaiting for skilled nursing facility placement.  Assessment/Plan:  Principal Problem:   Acute metabolic encephalopathy Active Problems:   Peripheral neuropathy   COPD (chronic obstructive pulmonary disease) (HCC)   Diabetic peripheral neuropathy associated with type 2 diabetes mellitus ([place])   Cerebral embolism with cerebral infarction   Mixed hyperlipidemia  Psychosis, delirium, bipolar disorder . Off one-to-one sitter. Reassessed by psychiatry on 04/16/2020. On Elavil, BuSpar, Tegretol, Zyprexa. Initial plan was for inpatient geri psych admission but now patient is okay to discharge to SNF  Acute respiratory failure with hypoxia Resolved. Thought to be secondary to CHF obstructive sleep apnea and COPD. Currently at room air.  Acute systolic heart failure 2D echocardiogram from 03/2020 significant for an EF of 40% with associated moderately reduced right ventricular systolic function with left ventricular enlargement.  Currently euvolemic and is on oral metoprolol and oral Lasix   Possible obstructive sleep apnea Patient noted to have intermittent apneic spells while sleeping. Recommendation for outpatient sleep study.  Has remained stable.  History of CVA Imaging of the brain showed right/left occipital infarcts in addition to left basal ganglia/thalamic  microhemorahages.  Plan is to continue Plavix and Lipitor on discharge  Diabetes mellitus, type II Controlled at this time.  Continue lantus and sliding scale insulin, diabetic diet.  Closely monitor. Latest POC glucose of 177  Chronic pancreatitis -Continue Creon  Acute kidney injury on CKD stage IIIa Stable from recent labs. Creatinine today at 1.3.  Mild hypokalemia. We will continue to replenish orally.  Check levels in a.m.  Anemia of chronic disease Currently stable. Iron panel is unremarkable.  Right supraclavicular/mediastinal adenopathy  history of breast cancer treated with mastectomy, will need outpatient oncology follow-up.  Decreased pedal pulses/Multiple LE wounds secondary to peripheral vascular disease.  Right leg pain.  Diabetic peripheral neuropathy. Follows up with vascular surgery as outpatient.  Continue Plavix, Lipitor.  X-ray of the tibia-fibula on 12/11 was negative for acute fracture.  Complains of mild pain.  Amitriptyline 100 mg at bedtime,  Recent serratia bacteremia Was on cefepime initially.  Off antibiotic at this time   DVT prophylaxis: SCDs Start: 03/21/20 1629  Code Status: Full code  Family Communication:   None today.  Status is: Inpatient  Remains inpatient appropriate because:Need for skilled nursing facility placement,   Dispo: The patient is from: Home              Anticipated d/c is to: SNF              Anticipated d/c date is: 1 to 2 days pending skilled nursing facility placement.              Patient currently is medically stable to d/c.    Consultants:  PCCM  ID  Psychiatry  Procedures:  None  Antibiotics:  . None at this time  Anti-infectives (From admission, onward)   Start     Dose/Rate Route Frequency Ordered  Stop   03/21/20 1800  ceFEPIme (MAXIPIME) 2 g in sodium chloride 0.9 % 100 mL IVPB        2 g 200 mL/hr over 30 Minutes Intravenous Every 8 hours 03/21/20 1621 03/22/20 1332       Subjective: Today, patient was seen and examined at bedside.  Complains of pain in the legs.  Denies any nausea vomiting abdominal pain.  No agitation reported.  Objective: Vitals:   04/16/20 2029 04/17/20 0623  BP:  (!) 144/96  Pulse:  (!) 103  Resp:  20  Temp:  98.2 F (36.8 C)  SpO2: 90% 100%    Intake/Output Summary (Last 24 hours) at 04/17/2020 0751 Last data filed at 04/17/2020 0600 Gross per 24 hour  Intake 240 ml  Output --  Net 240 ml   Filed Weights   04/12/20 0500 04/13/20 0627 04/17/20 0600  Weight: 90.2 kg 89.1 kg 87.2 kg   Body mass index is 31.03 kg/m.   Physical Exam: General: Obese built, not in obvious distress, alert awake and communicative HENT:   No scleral pallor or icterus noted. Oral mucosa is moist.  Chest: Diminished breath sounds bilaterally. CVS: S1 &S2 heard. No murmur.  Regular rate and rhythm. Abdomen: Soft, nontender, nondistended.  Bowel sounds are heard.   Extremities: No cyanosis, clubbing but bilateral lower extremity edema, right second toe dried wound Psych: Alert, awake and communicative, flat affect, anxious at times CNS:  No cranial nerve deficits.  Moving all extremities. Skin: Warm and dry.    Data Review: I have personally reviewed the following laboratory data and studies,  CBC: No results for input(s): WBC, NEUTROABS, HGB, HCT, MCV, PLT in the last 168 hours. Basic Metabolic Panel: Recent Labs  Lab 04/17/20 0557  NA 137  K 3.1*  CL 96*  CO2 28  GLUCOSE 175*  BUN 21*  CREATININE 1.31*  CALCIUM 8.1*   Liver Function Tests: No results for input(s): AST, ALT, ALKPHOS, BILITOT, PROT, ALBUMIN in the last 168 hours. No results for input(s): LIPASE, AMYLASE in the last 168 hours. No results for input(s): AMMONIA in the last 168 hours. Cardiac Enzymes: No results for input(s): CKTOTAL, CKMB, CKMBINDEX, TROPONINI in the last 168 hours. BNP (last 3 results) Recent Labs    03/04/20 0537 03/21/20 0748  03/24/20 0156  BNP 2,502.5* 1,352.8* 989.2*    ProBNP (last 3 results) Recent Labs    12/19/19 1546 02/09/20 0825  PROBNP 1,148* 1,185*    CBG: Recent Labs  Lab 04/15/20 2208 04/16/20 0734 04/16/20 1132 04/16/20 1725 04/16/20 1931  GLUCAP 178* 158* 200* 166* 177*   Recent Results (from the past 240 hour(s))  Resp Panel by RT-PCR (Flu A&B, Covid) Nasopharyngeal Swab     Status: None   Collection Time: 04/15/20 10:20 AM   Specimen: Nasopharyngeal Swab; Nasopharyngeal(NP) swabs in vial transport medium  Result Value Ref Range Status   SARS Coronavirus 2 by RT PCR NEGATIVE NEGATIVE Final    Comment: (NOTE) SARS-CoV-2 target nucleic acids are NOT DETECTED.  The SARS-CoV-2 RNA is generally detectable in upper respiratory specimens during the acute phase of infection. The lowest concentration of SARS-CoV-2 viral copies this assay can detect is 138 copies/mL. A negative result does not preclude SARS-Cov-2 infection and should not be used as the sole basis for treatment or other patient management decisions. A negative result may occur with  improper specimen collection/handling, submission of specimen other than nasopharyngeal swab, presence of viral mutation(s) within the areas targeted  by this assay, and inadequate number of viral copies(<138 copies/mL). A negative result must be combined with clinical observations, patient history, and epidemiological information. The expected result is Negative.  Fact Sheet for Patients:  EntrepreneurPulse.com.au  Fact Sheet for Healthcare Providers:  IncredibleEmployment.be  This test is no t yet approved or cleared by the [place] FDA and  has been authorized for detection and/or diagnosis of SARS-CoV-2 by FDA under an Emergency Use Authorization (EUA). This EUA will remain  in effect (meaning this test can be used) for the duration of the COVID-19 declaration under Section 564(b)(1) of the  Act, 21 U.S.C.section 360bbb-3(b)(1), unless the authorization is terminated  or revoked sooner.       Influenza A by PCR NEGATIVE NEGATIVE Final   Influenza B by PCR NEGATIVE NEGATIVE Final    Comment: (NOTE) The Xpert Xpress SARS-CoV-2/FLU/RSV plus assay is intended as an aid in the diagnosis of influenza from Nasopharyngeal swab specimens and should not be used as a sole basis for treatment. Nasal washings and aspirates are unacceptable for Xpert Xpress SARS-CoV-2/FLU/RSV testing.  Fact Sheet for Patients: EntrepreneurPulse.com.au  Fact Sheet for Healthcare Providers: IncredibleEmployment.be  This test is not yet approved or cleared by the [place] FDA and has been authorized for detection and/or diagnosis of SARS-CoV-2 by FDA under an Emergency Use Authorization (EUA). This EUA will remain in effect (meaning this test can be used) for the duration of the COVID-19 declaration under Section 564(b)(1) of the Act, 21 U.S.C. section 360bbb-3(b)(1), unless the authorization is terminated or revoked.  Performed at [facility], [place] [address] [place], [place] [address]      Studies: No results found.    Flora Lipps, MD  [facility] 04/17/2020  If 7PM-7AM, please contact night-coverage

## 2020-04-17 NOTE — Plan of Care (Signed)
  Problem: Clinical Measurements: Goal: Ability to maintain clinical measurements within normal limits will improve Outcome: Progressing Goal: Will remain free from infection Outcome: Progressing Goal: Respiratory complications will improve Outcome: Progressing   

## 2020-04-17 NOTE — Plan of Care (Signed)
Problem: Education: Goal: Knowledge of General Education information will improve Description: Including pain rating scale, medication(s)/side effects and non-pharmacologic comfort measures Outcome: Progressing   Problem: Health Behavior/Discharge Planning: Goal: Ability to manage health-related needs will improve Outcome: Progressing   Problem: Clinical Measurements: Goal: Ability to maintain clinical measurements within normal limits will improve Outcome: Progressing Goal: Will remain free from infection Outcome: Progressing Goal: Diagnostic test results will improve Outcome: Progressing Goal: Respiratory complications will improve Outcome: Progressing Goal: Cardiovascular complication will be avoided Outcome: Progressing   Problem: Nutrition: Goal: Adequate nutrition will be maintained Outcome: Progressing   Problem: Elimination: Goal: Will not experience complications related to bowel motility Outcome: Progressing Goal: Will not experience complications related to urinary retention Outcome: Progressing   Problem: Pain Managment: Goal: General experience of comfort will improve Outcome: Progressing   Problem: Safety: Goal: Ability to remain free from injury will improve Outcome: Progressing   Problem: Skin Integrity: Goal: Risk for impaired skin integrity will decrease Outcome: Progressing   Problem: Education: Goal: Ability to incorporate positive changes in behavior to improve self-esteem will improve Outcome: Progressing   Problem: Health Behavior/Discharge Planning: Goal: Ability to identify and utilize available resources and services will improve Outcome: Progressing Goal: Ability to remain free from injury will improve Outcome: Progressing   Problem: Self-Concept: Goal: Will verbalize positive feelings about self Outcome: Progressing   Problem: Skin Integrity: Goal: Demonstration of wound healing without infection will improve Outcome:  Progressing   Problem: Education: Goal: Ability to demonstrate management of disease process will improve Outcome: Progressing Goal: Ability to verbalize understanding of medication therapies will improve Outcome: Progressing   Problem: Activity: Goal: Capacity to carry out activities will improve Outcome: Progressing   Problem: Cardiac: Goal: Ability to achieve and maintain adequate cardiopulmonary perfusion will improve Outcome: Progressing   Problem: Education: Goal: Knowledge of General Education information will improve Description: Including pain rating scale, medication(s)/side effects and non-pharmacologic comfort measures Outcome: Progressing   Problem: Health Behavior/Discharge Planning: Goal: Ability to manage health-related needs will improve Outcome: Progressing   Problem: Clinical Measurements: Goal: Ability to maintain clinical measurements within normal limits will improve Outcome: Progressing Goal: Will remain free from infection Outcome: Progressing Goal: Diagnostic test results will improve Outcome: Progressing Goal: Respiratory complications will improve Outcome: Progressing Goal: Cardiovascular complication will be avoided Outcome: Progressing   Problem: Nutrition: Goal: Adequate nutrition will be maintained Outcome: Progressing   Problem: Elimination: Goal: Will not experience complications related to bowel motility Outcome: Progressing Goal: Will not experience complications related to urinary retention Outcome: Progressing   Problem: Pain Managment: Goal: General experience of comfort will improve Outcome: Progressing   Problem: Safety: Goal: Ability to remain free from injury will improve Outcome: Progressing   Problem: Skin Integrity: Goal: Risk for impaired skin integrity will decrease Outcome: Progressing   Problem: Education: Goal: Ability to incorporate positive changes in behavior to improve self-esteem will improve Outcome:  Progressing   Problem: Health Behavior/Discharge Planning: Goal: Ability to identify and utilize available resources and services will improve Outcome: Progressing Goal: Ability to remain free from injury will improve Outcome: Progressing   Problem: Self-Concept: Goal: Will verbalize positive feelings about self Outcome: Progressing   Problem: Skin Integrity: Goal: Demonstration of wound healing without infection will improve Outcome: Progressing   Problem: Education: Goal: Ability to demonstrate management of disease process will improve Outcome: Progressing Goal: Ability to verbalize understanding of medication therapies will improve Outcome: Progressing   Problem: Activity: Goal: Capacity to carry out activities will  improve Outcome: Progressing   Problem: Cardiac: Goal: Ability to achieve and maintain adequate cardiopulmonary perfusion will improve Outcome: Progressing

## 2020-04-18 LAB — BASIC METABOLIC PANEL
Anion gap: 13 (ref 5–15)
BUN: 17 mg/dL (ref 6–20)
CO2: 22 mmol/L (ref 22–32)
Calcium: 7.9 mg/dL — ABNORMAL LOW (ref 8.9–10.3)
Chloride: 102 mmol/L (ref 98–111)
Creatinine, Ser: 0.91 mg/dL (ref 0.44–1.00)
GFR, Estimated: >60 mL/min (ref >60)
Glucose, Bld: 143 mg/dL — ABNORMAL HIGH (ref 70–99)
Potassium: 3.8 mmol/L (ref 3.5–5.1)
Sodium: 137 mmol/L (ref 135–145)

## 2020-04-18 LAB — GLUCOSE, CAPILLARY
Glucose-Capillary: 158 mg/dL — ABNORMAL HIGH (ref 70–99)
Glucose-Capillary: 141 mg/dL — ABNORMAL HIGH (ref 70–99)
Glucose-Capillary: 152 mg/dL — ABNORMAL HIGH (ref 70–99)

## 2020-04-18 MED ORDER — OXYCODONE HCL 5 MG PO TABS
5.0000 mg | ORAL_TABLET | Freq: Four times a day (QID) | ORAL | Status: DC | PRN
  Administered 2020-04-18 – 2020-04-28 (×8): 5 mg via ORAL
  Filled 2020-04-18 (×10): qty 1

## 2020-04-18 MED ORDER — HYDRALAZINE HCL 10 MG PO TABS: 10.0000 mg | ORAL_TABLET | Freq: Four times a day (QID) | ORAL | Status: DC | PRN

## 2020-04-18 MED ORDER — HYDRALAZINE HCL 10 MG PO TABS
10.0000 mg | ORAL_TABLET | Freq: Four times a day (QID) | ORAL | Status: DC | PRN
  Administered 2020-04-18: 10 mg via ORAL
  Filled 2020-04-18: qty 1

## 2020-04-18 MED ORDER — DICYCLOMINE HCL 10 MG PO CAPS
10.0000 mg | ORAL_CAPSULE | Freq: Three times a day (TID) | ORAL | Status: DC
  Administered 2020-04-18 – 2020-04-28 (×24): 10 mg via ORAL
  Filled 2020-04-18 (×25): qty 1

## 2020-04-18 NOTE — Progress Notes (Signed)
PROGRESS NOTE  Morgan Beasley [id number] DOB: Aug 10, 1960 DOA: 03/21/2020 PCP: Riki Sheer, NP   LOS: 28 days   Brief narrative: Morgan Beasley is a 59 y.o. female with past medical history of depression, bipolar disorder, substance abuse, pulmonary valve endocarditis, multiple CVAs presented to the hospital secondary to agitation, hallucinations, restlessness.  Patient was initially managed on Precedex drip in the ICU.  She found to have large pleural effusions with pulmonary edema as well.  Patient started on antipsychotics therapy with improvement of symptoms and is currently off one-to-one sitter. Awaiting for skilled nursing facility placement.  Assessment/Plan:  Principal Problem:   Acute metabolic encephalopathy Active Problems:   Peripheral neuropathy   COPD (chronic obstructive pulmonary disease) (HCC)   Diabetic peripheral neuropathy associated with type 2 diabetes mellitus ([place])   Cerebral embolism with cerebral infarction   Mixed hyperlipidemia  Psychosis, delirium, bipolar disorder Off one-to-one sitter. Reassessed by psychiatry on 04/16/2020. On Elavil, BuSpar, Tegretol, Zyprexa. Initial plan was for inpatient geri psych admission but now patient is okay to discharge to SNF.  Patient's family at bedside.  Acute respiratory failure with hypoxia Resolved. Thought to be secondary to CHF obstructive sleep apnea and COPD. Currently at room air.  Acute systolic heart failure 2D echocardiogram from 03/2020 significant for an EF of 40% with associated moderately reduced right ventricular systolic function with left ventricular enlargement.  Currently euvolemic and is on oral metoprolol and oral Lasix   Possible obstructive sleep apnea Patient noted to have intermittent apneic spells while sleeping. Recommendation for outpatient sleep study.  Has remained stable.  History of CVA Imaging of the brain showed right/left occipital infarcts in addition to  left basal ganglia/thalamic microhemorahages.  Plan is to continue Plavix and Lipitor on discharge  Diabetes mellitus, type II Controlled at this time.  Continue lantus and sliding scale insulin, diabetic diet.  Closely monitor. Latest POC glucose of 177  Chronic pancreatitis -Continue Creon  Acute kidney injury on CKD stage IIIa Stable from recent labs. Creatinine today at 1.3.  Mild hypokalemia. We will continue to replenish orally.  Check levels in a.m.  Anemia of chronic disease Currently stable. Iron panel is unremarkable.  Right supraclavicular/mediastinal adenopathy  history of breast cancer treated with mastectomy, will need outpatient oncology follow-up.  Decreased pedal pulses/Multiple LE wounds secondary to peripheral vascular disease.  Right leg pain.  Diabetic peripheral neuropathy. Follows up with vascular surgery as outpatient.  Continue Plavix, Lipitor.  X-ray of the tibia-fibula on 12/11 was negative for acute fracture.  Complains of mild pain.  Amitriptyline 100 mg at bedtime,  Recent serratia bacteremia Was on cefepime initially.  Off antibiotic at this time  DVT prophylaxis: SCDs Start: 03/21/20 1629  Code Status: Full code  Family Communication: Spoke with the patient's sister at bedside  None today.  Status is: Inpatient  Remains inpatient appropriate because:Need for skilled nursing facility placement,   Dispo: The patient is from: Home              Anticipated d/c is to: SNF              Anticipated d/c date is: 1 to 2 days pending skilled nursing facility placement.              Patient currently is medically stable to d/c.    Consultants:  PCCM  ID  Psychiatry  Procedures:  None  Antibiotics:  . None at this time  Anti-infectives (From admission, onward)  Start     Dose/Rate Route Frequency Ordered Stop   03/21/20 1800  ceFEPIme (MAXIPIME) 2 g in sodium chloride 0.9 % 100 mL IVPB        2 g 200 mL/hr over 30 Minutes  Intravenous Every 8 hours 03/21/20 1621 03/22/20 1332      Subjective: Today, patient was seen and examined at bedside.  Complains of mild pain in the legs.  Denies any nausea vomiting fevers or chills.  Patient's daughter at bedside.   Objective: Vitals:   04/17/20 2156 04/18/20 0448  BP: (!) 149/100 (!) 143/99  Pulse: (!) 101 99  Resp: 18 20  Temp:    SpO2: 93% 94%    Intake/Output Summary (Last 24 hours) at 04/18/2020 0933 Last data filed at 04/17/2020 1300 Gross per 24 hour  Intake 240 ml  Output --  Net 240 ml   Filed Weights   04/12/20 0500 04/13/20 0627 04/17/20 0600  Weight: 90.2 kg 89.1 kg 87.2 kg   Body mass index is 31.03 kg/m.   Physical Exam: General: Obese built, not in obvious distress, alert awake and communicative HENT:   No scleral pallor or icterus noted. Oral mucosa is moist.  Chest:  Clear breath sounds.  Diminished breath sounds bilaterally. No crackles or wheezes.  CVS: S1 &S2 heard. No murmur.  Regular rate and rhythm. Abdomen: Soft, nontender, nondistended.  Bowel sounds are heard.   Extremities: No cyanosis, clubbing or bilateral lower extremity edema, right second toe dried wound, peripheral pulses are palpable. Psych: Alert, awake and communicative, flat affect, anxious at times, CNS:  No cranial nerve deficits.  Power equal in all extremities.   Skin: Warm and dry.    Data Review: I have personally reviewed the following laboratory data and studies,  CBC: No results for input(s): WBC, NEUTROABS, HGB, HCT, MCV, PLT in the last 168 hours. Basic Metabolic Panel: Recent Labs  Lab 04/17/20 0557  NA 137  K 3.1*  CL 96*  CO2 28  GLUCOSE 175*  BUN 21*  CREATININE 1.31*  CALCIUM 8.1*   Liver Function Tests: No results for input(s): AST, ALT, ALKPHOS, BILITOT, PROT, ALBUMIN in the last 168 hours. No results for input(s): LIPASE, AMYLASE in the last 168 hours. No results for input(s): AMMONIA in the last 168 hours. Cardiac Enzymes: No  results for input(s): CKTOTAL, CKMB, CKMBINDEX, TROPONINI in the last 168 hours. BNP (last 3 results) Recent Labs    03/04/20 0537 03/21/20 0748 03/24/20 0156  BNP 2,502.5* 1,352.8* 989.2*    ProBNP (last 3 results) Recent Labs    12/19/19 1546 02/09/20 0825  PROBNP 1,148* 1,185*    CBG: Recent Labs  Lab 04/17/20 0804 04/17/20 1117 04/17/20 1721 04/17/20 2154 04/18/20 0919  GLUCAP 164* 170* 164* 157* 158*   Recent Results (from the past 240 hour(s))  Resp Panel by RT-PCR (Flu A&B, Covid) Nasopharyngeal Swab     Status: None   Collection Time: 04/15/20 10:20 AM   Specimen: Nasopharyngeal Swab; Nasopharyngeal(NP) swabs in vial transport medium  Result Value Ref Range Status   SARS Coronavirus 2 by RT PCR NEGATIVE NEGATIVE Final    Comment: (NOTE) SARS-CoV-2 target nucleic acids are NOT DETECTED.  The SARS-CoV-2 RNA is generally detectable in upper respiratory specimens during the acute phase of infection. The lowest concentration of SARS-CoV-2 viral copies this assay can detect is 138 copies/mL. A negative result does not preclude SARS-Cov-2 infection and should not be used as the sole basis for treatment or  other patient management decisions. A negative result may occur with  improper specimen collection/handling, submission of specimen other than nasopharyngeal swab, presence of viral mutation(s) within the areas targeted by this assay, and inadequate number of viral copies(<138 copies/mL). A negative result must be combined with clinical observations, patient history, and epidemiological information. The expected result is Negative.  Fact Sheet for Patients:  EntrepreneurPulse.com.au  Fact Sheet for Healthcare Providers:  IncredibleEmployment.be  This test is no t yet approved or cleared by the [place] FDA and  has been authorized for detection and/or diagnosis of SARS-CoV-2 by FDA under an Emergency Use Authorization  (EUA). This EUA will remain  in effect (meaning this test can be used) for the duration of the COVID-19 declaration under Section 564(b)(1) of the Act, 21 U.S.C.section 360bbb-3(b)(1), unless the authorization is terminated  or revoked sooner.       Influenza A by PCR NEGATIVE NEGATIVE Final   Influenza B by PCR NEGATIVE NEGATIVE Final    Comment: (NOTE) The Xpert Xpress SARS-CoV-2/FLU/RSV plus assay is intended as an aid in the diagnosis of influenza from Nasopharyngeal swab specimens and should not be used as a sole basis for treatment. Nasal washings and aspirates are unacceptable for Xpert Xpress SARS-CoV-2/FLU/RSV testing.  Fact Sheet for Patients: EntrepreneurPulse.com.au  Fact Sheet for Healthcare Providers: IncredibleEmployment.be  This test is not yet approved or cleared by the [place] FDA and has been authorized for detection and/or diagnosis of SARS-CoV-2 by FDA under an Emergency Use Authorization (EUA). This EUA will remain in effect (meaning this test can be used) for the duration of the COVID-19 declaration under Section 564(b)(1) of the Act, 21 U.S.C. section 360bbb-3(b)(1), unless the authorization is terminated or revoked.  Performed at [facility], [place] [address], [place], [place] [address]      Studies: No results found.    Flora Lipps, MD  [facility] 04/18/2020  If 7PM-7AM, please contact night-coverage

## 2020-04-18 NOTE — Progress Notes (Signed)
Pt assertively requested that nursing staff leave her room at this time.  Refused all care and treatments ordered. Nursing staff ensured pt safety and left room at pt request. Brielynn Sekula, Laurel Dimmer, RN

## 2020-04-18 NOTE — Plan of Care (Signed)
  Problem: Education: Goal: Knowledge of General Education information will improve Description: Including pain rating scale, medication(s)/side effects and non-pharmacologic comfort measures Outcome: Completed/Met

## 2020-04-19 ENCOUNTER — Encounter (HOSPITAL_COMMUNITY): Payer: Self-pay | Admitting: Internal Medicine

## 2020-04-19 ENCOUNTER — Inpatient Hospital Stay (HOSPITAL_COMMUNITY): Payer: Medicare Other

## 2020-04-19 DIAGNOSIS — R112 Nausea with vomiting, unspecified: Secondary | ICD-10-CM

## 2020-04-19 DIAGNOSIS — R0602 Shortness of breath: Secondary | ICD-10-CM

## 2020-04-19 DIAGNOSIS — E8770 Fluid overload, unspecified: Secondary | ICD-10-CM

## 2020-04-19 DIAGNOSIS — R109 Unspecified abdominal pain: Secondary | ICD-10-CM

## 2020-04-19 DIAGNOSIS — I509 Heart failure, unspecified: Secondary | ICD-10-CM

## 2020-04-19 DIAGNOSIS — R609 Edema, unspecified: Secondary | ICD-10-CM

## 2020-04-19 LAB — GLUCOSE, CAPILLARY
Glucose-Capillary: 113 mg/dL — ABNORMAL HIGH (ref 70–99)
Glucose-Capillary: 140 mg/dL — ABNORMAL HIGH (ref 70–99)
Glucose-Capillary: 137 mg/dL — ABNORMAL HIGH (ref 70–99)

## 2020-04-19 LAB — BASIC METABOLIC PANEL
Anion gap: 12 (ref 5–15)
BUN: 21 mg/dL — ABNORMAL HIGH (ref 6–20)
CO2: 32 mmol/L (ref 22–32)
Calcium: 8.3 mg/dL — ABNORMAL LOW (ref 8.9–10.3)
Chloride: 94 mmol/L — ABNORMAL LOW (ref 98–111)
Creatinine, Ser: 1.36 mg/dL — ABNORMAL HIGH (ref 0.44–1.00)
GFR, Estimated: 45 mL/min — ABNORMAL LOW (ref >60)
Glucose, Bld: 158 mg/dL — ABNORMAL HIGH (ref 70–99)
Potassium: 4.5 mmol/L (ref 3.5–5.1)
Sodium: 138 mmol/L (ref 135–145)

## 2020-04-19 LAB — CBC
HCT: 43.8 % (ref 36.0–46.0)
Hemoglobin: 13.6 g/dL (ref 12.0–15.0)
MCH: 23.8 pg — ABNORMAL LOW (ref 26.0–34.0)
MCHC: 31.1 g/dL (ref 30.0–36.0)
MCV: 76.7 fL — ABNORMAL LOW (ref 80.0–100.0)
Platelets: 442 10*3/uL — ABNORMAL HIGH (ref 150–400)
RBC: 5.71 MIL/uL — ABNORMAL HIGH (ref 3.87–5.11)
RDW: 22.6 % — ABNORMAL HIGH (ref 11.5–15.5)
WBC: 9.8 10*3/uL (ref 4.0–10.5)
nRBC: 0.5 % — ABNORMAL HIGH (ref 0.0–0.2)

## 2020-04-19 LAB — MAGNESIUM: Magnesium: 1.3 mg/dL — ABNORMAL LOW (ref 1.7–2.4)

## 2020-04-19 MED ORDER — IOHEXOL 9 MG/ML PO SOLN
500.0000 mL | ORAL | Status: AC
  Administered 2020-04-19: 500 mL via ORAL

## 2020-04-19 MED ORDER — BISACODYL 5 MG PO TBEC
10.0000 mg | DELAYED_RELEASE_TABLET | Freq: Every day | ORAL | Status: DC
  Administered 2020-04-19 – 2020-04-22 (×2): 10 mg via ORAL
  Filled 2020-04-19 (×2): qty 2

## 2020-04-19 MED ORDER — ADULT MULTIVITAMIN W/MINERALS CH
1.0000 | ORAL_TABLET | Freq: Every day | ORAL | Status: DC
  Administered 2020-04-22 – 2020-04-27 (×3): 1 via ORAL
  Filled 2020-04-19 (×5): qty 1

## 2020-04-19 MED ORDER — PROSOURCE PLUS PO LIQD
30.0000 mL | Freq: Two times a day (BID) | ORAL | Status: DC
  Administered 2020-04-22: 30 mL via ORAL
  Filled 2020-04-19 (×3): qty 30

## 2020-04-19 MED ORDER — IOHEXOL 9 MG/ML PO SOLN
ORAL | Status: AC
  Filled 2020-04-19: qty 1000

## 2020-04-19 MED ORDER — BISACODYL 10 MG RE SUPP
10.0000 mg | Freq: Every day | RECTAL | Status: DC
  Administered 2020-04-20: 10 mg via RECTAL
  Filled 2020-04-19 (×2): qty 1

## 2020-04-19 MED ORDER — GLUCERNA SHAKE PO LIQD
237.0000 mL | Freq: Two times a day (BID) | ORAL | Status: DC
  Administered 2020-04-22 – 2020-04-27 (×4): 237 mL via ORAL
  Filled 2020-04-19 (×19): qty 237

## 2020-04-19 MED ORDER — MAGNESIUM SULFATE 2 GM/50ML IV SOLN: 2.0000 g | Freq: Once | INTRAVENOUS | Status: DC

## 2020-04-19 MED ORDER — SODIUM CHLORIDE 0.9 % IV SOLN: INTRAVENOUS | Status: AC

## 2020-04-19 NOTE — Progress Notes (Addendum)
PROGRESS NOTE  Morgan Beasley FUX:[id number] DOB: November 09, 1960 DOA: 03/21/2020 PCP: Riki Sheer, NP   LOS: 29 days   Brief narrative: Morgan Beasley is a 59 y.o. female with past medical history of depression, bipolar disorder, substance abuse, pulmonary valve endocarditis, multiple CVAs presented to the hospital secondary to agitation, hallucinations, restlessness.  Patient was initially managed on Precedex drip in the ICU.  She found to have large pleural effusions with pulmonary edema as well.  Patient started on antipsychotics therapy with improvement of symptoms and is currently off one-to-one sitter. Awaiting for skilled nursing facility placement.  Patient developed abdominal distention nausea vomiting on 04/18/2020, of the abdomen showed small bowel distention.  Assessment/Plan:  Principal Problem:   Acute metabolic encephalopathy Active Problems:   Peripheral neuropathy   COPD (chronic obstructive pulmonary disease) (HCC)   Diabetic peripheral neuropathy associated with type 2 diabetes mellitus (HCC)   Cerebral embolism with cerebral infarction   Mixed hyperlipidemia   Nausea vomiting abdominal distention.  Has had a bowel movement yesterday.  Complains of abdominal pain.  Physical examination reveals a distended abdomen with gas.  Abdominal x-ray with distended small bowel without fluid levels.  Will obtain CT scan of the abdomen pelvis to further evaluate.  Possibility of small bowel ileus/obstruction, keep the patient n.p.o. for now.  Hold her Lipitor Lasix and Plavix for now.  We will keep electrolytes monitored.  Latest potassium 3.8.  Add magnesium levels.  Psychosis, delirium, bipolar disorder Off one-to-one sitter. Reassessed by psychiatry on 04/16/2020. On Elavil, BuSpar, Tegretol, Zyprexa. Initial plan was for inpatient geri psych admission but now patient is stable for discharge to skilled nursing facility/home.  Patient's family wishes home on discharge  and it has been difficult to find a skilled nursing facility for her as well.  Acute respiratory failure with hypoxia Resolved. Thought to be secondary to CHF obstructive sleep apnea and COPD. Currently at room air.  Acute systolic heart failure 2D echocardiogram from 03/2020 significant for an EF of 40% with associated moderately reduced right ventricular systolic function with left ventricular enlargement.  Currently euvolemic and is on oral metoprolol and oral Lasix.  Hold Lasix, Lipitor and Plavix for now.  Possible obstructive sleep apnea Patient noted to have intermittent apneic spells while sleeping. Recommendation for outpatient sleep study.  Has remained stable.  History of CVA Imaging of the brain showed right/left occipital infarcts in addition to left basal ganglia/thalamic microhemorahages.  Plan is to continue Plavix and Lipitor on discharge.  Diabetes mellitus, type II Controlled at this time.  Continue lantus and sliding scale insulin. Closely monitor.   Chronic pancreatitis On Creon  Acute kidney injury on CKD stage IIIa Improved.  Recent creatinine of 0.9.  Mild hypokalemia.  Improving.  Will continue to replenish as necessary especially in the context of abdominal distention.  Possibility of small bowel ileus.  Mild hypomagnesemia.  Will replenish with IV magnesium.  Anemia of chronic disease Currently stable. Iron panel is unremarkable.  Right supraclavicular/mediastinal adenopathy  history of breast cancer treated with mastectomy, will need outpatient oncology follow-up.  Decreased pedal pulses/Multiple LE wounds secondary to peripheral vascular disease.  Right leg pain.  Diabetic peripheral neuropathy. Follows up with vascular surgery as outpatient.  Continue Plavix, Lipitor.  X-ray of the tibia-fibula on 12/11 was negative for acute fracture.  Complains of mild pain.  Amitriptyline 100 mg at bedtime.  Recent serratia bacteremia Was on cefepime  initially.  Off antibiotic at this time  DVT  prophylaxis: SCDs Start: 03/21/20 1629  Code Status: Full code  Family Communication:  I spoke with the patient's daughter on and updated her about the clinical condition of the patient.   Status is: Inpatient  Remains inpatient appropriate because: Debility, deconditioning, abdominal distention vomiting  Dispo: The patient is from: Home              Anticipated d/c is to: SNF recommended by PT but likely to be home with home health.  Home health RN PT OT order placed in.              Anticipated d/c date is: 1 to 2 days              Patient currently is not medically stable to d/c secondary to abdominal distention and possible ileus/obstruction..    Consultants:  PCCM  ID  Psychiatry  Procedures:  None  Antibiotics:  . None at this time  Anti-infectives (From admission, onward)   Start     Dose/Rate Route Frequency Ordered Stop   03/21/20 1800  ceFEPIme (MAXIPIME) 2 g in sodium chloride 0.9 % 100 mL IVPB        2 g 200 mL/hr over 30 Minutes Intravenous Every 8 hours 03/21/20 1621 03/22/20 1332     Subjective: Today, patient was seen and examined at bedside.  Nursing staff reported that patient did have nausea vomiting abdominal pain and distention.  Bowel movement yesterday.    Objective: Vitals:   04/19/20 0810 04/19/20 1018  BP: (!) 148/96 (!) 158/92  Pulse: (!) 117 (!) 114  Resp: 19 18  Temp: (!) 97.5 F (36.4 C) 98 F (36.7 C)  SpO2: 99% 93%   No intake or output data in the 24 hours ending 04/19/20 1231 Filed Weights   04/12/20 0500 04/13/20 0627 04/17/20 0600  Weight: 90.2 kg 89.1 kg 87.2 kg   Body mass index is 31.03 kg/m.   Physical Exam:  General: Obese built, in mild distress due to abdomen distention, alert awake and communicative HENT:   No scleral pallor or icterus noted. Oral mucosa is moist.  Chest:  Clear breath sounds.  Diminished breath sounds bilaterally. No crackles or wheezes.   CVS: S1 &S2 heard. No murmur.  Regular rate and rhythm. Abdomen: Soft, tympanic abdomen, distended, non specific tenderness noted, bowel sounds heard  Extremities: No cyanosis, clubbing or bilateral lower extremity edema, right second toe dried wound, peripheral pulses are palpable. Psych: Alert, awake and communicative, flat affect, anxious at times, CNS:  No cranial nerve deficits.  Power equal in all extremities.   Skin: Warm and dry.  Leg wounds.  Data Review: I have personally reviewed the following laboratory data and studies,  CBC: No results for input(s): WBC, NEUTROABS, HGB, HCT, MCV, PLT in the last 168 hours. Basic Metabolic Panel: Recent Labs  Lab 04/17/20 0557 04/18/20 1148  NA 137 137  K 3.1* 3.8  CL 96* 102  CO2 28 22  GLUCOSE 175* 143*  BUN 21* 17  CREATININE 1.31* 0.91  CALCIUM 8.1* 7.9*   Liver Function Tests: No results for input(s): AST, ALT, ALKPHOS, BILITOT, PROT, ALBUMIN in the last 168 hours. No results for input(s): LIPASE, AMYLASE in the last 168 hours. No results for input(s): AMMONIA in the last 168 hours. Cardiac Enzymes: No results for input(s): CKTOTAL, CKMB, CKMBINDEX, TROPONINI in the last 168 hours. BNP (last 3 results) Recent Labs    03/04/20 0537 03/21/20 0748 03/24/20 0156  BNP 2,502.5*  1,352.8* 989.2*    ProBNP (last 3 results) Recent Labs    12/19/19 1546 02/09/20 0825  PROBNP 1,148* 1,185*    CBG: Recent Labs  Lab 04/18/20 0919 04/18/20 1619 04/18/20 2228 04/19/20 0731 04/19/20 1158  GLUCAP 158* 141* 152* 113* 140*   Recent Results (from the past 240 hour(s))  Resp Panel by RT-PCR (Flu A&B, Covid) Nasopharyngeal Swab     Status: None   Collection Time: 04/15/20 10:20 AM   Specimen: Nasopharyngeal Swab; Nasopharyngeal(NP) swabs in vial transport medium  Result Value Ref Range Status   SARS Coronavirus 2 by RT PCR NEGATIVE NEGATIVE Final    Comment: (NOTE) SARS-CoV-2 target nucleic acids are NOT DETECTED.  The  SARS-CoV-2 RNA is generally detectable in upper respiratory specimens during the acute phase of infection. The lowest concentration of SARS-CoV-2 viral copies this assay can detect is 138 copies/mL. A negative result does not preclude SARS-Cov-2 infection and should not be used as the sole basis for treatment or other patient management decisions. A negative result may occur with  improper specimen collection/handling, submission of specimen other than nasopharyngeal swab, presence of viral mutation(s) within the areas targeted by this assay, and inadequate number of viral copies(<138 copies/mL). A negative result must be combined with clinical observations, patient history, and epidemiological information. The expected result is Negative.  Fact Sheet for Patients:  EntrepreneurPulse.com.au  Fact Sheet for Healthcare Providers:  IncredibleEmployment.be  This test is no t yet approved or cleared by the [place] FDA and  has been authorized for detection and/or diagnosis of SARS-CoV-2 by FDA under an Emergency Use Authorization (EUA). This EUA will remain  in effect (meaning this test can be used) for the duration of the COVID-19 declaration under Section 564(b)(1) of the Act, 21 U.S.C.section 360bbb-3(b)(1), unless the authorization is terminated  or revoked sooner.       Influenza A by PCR NEGATIVE NEGATIVE Final   Influenza B by PCR NEGATIVE NEGATIVE Final    Comment: (NOTE) The Xpert Xpress SARS-CoV-2/FLU/RSV plus assay is intended as an aid in the diagnosis of influenza from Nasopharyngeal swab specimens and should not be used as a sole basis for treatment. Nasal washings and aspirates are unacceptable for Xpert Xpress SARS-CoV-2/FLU/RSV testing.  Fact Sheet for Patients: EntrepreneurPulse.com.au  Fact Sheet for Healthcare Providers: IncredibleEmployment.be  This test is not yet approved or  cleared by the [place] FDA and has been authorized for detection and/or diagnosis of SARS-CoV-2 by FDA under an Emergency Use Authorization (EUA). This EUA will remain in effect (meaning this test can be used) for the duration of the COVID-19 declaration under Section 564(b)(1) of the Act, 21 U.S.C. section 360bbb-3(b)(1), unless the authorization is terminated or revoked.  Performed at [facility], [place] [address], [place], [place] [address]      Studies: DG Abd 1 View  Result Date: 04/19/2020 CLINICAL DATA:  Abdominal pain.  Vomiting. EXAM: ABDOMEN - 1 VIEW COMPARISON:  CT 03/21/2020. FINDINGS: Multiple distended loops of small bowel noted. Paucity of intra colonic air noted. Small-bowel obstruction cannot be excluded. No free air. Mild degenerative changes lumbar spine and both hips. IMPRESSION: Multiple distended loops of small bowel. Paucity of intra colonic air noted. Small-bowel obstruction cannot be excluded. No free air. CT of the abdomen pelvis may prove useful for further evaluation. Electronically Signed   By: Marcello Moores  Register   On: 04/19/2020 08:32      Flora Lipps, MD  [facility] 04/19/2020  If  7PM-7AM, please contact night-coverage

## 2020-04-19 NOTE — Progress Notes (Signed)
Second I.V. nurse to come to assess patient for I.V. access. Patient refuses to be touched in any manner. Night RN at bedside. Available to be consulted if patient changes her mind.

## 2020-04-19 NOTE — Progress Notes (Addendum)
Pt. Agitated and refuse vitals.

## 2020-04-19 NOTE — Progress Notes (Signed)
Pt has refused to take dulcolax suppository as ordered.  Dr. Louanne Belton made aware.  Mckynleigh Mussell, Laurel Dimmer, RN

## 2020-04-19 NOTE — TOC Progression Note (Signed)
Transition of Care [facility]) - Progression Note    Patient Details  Name: Keriann Rankin MRN: [id number] Date of Birth: 1961-04-10  [facility] of [facility]) CM/SW Contact  Joaquin Courts, RN Phone Number: 04/19/2020, 3:54 PM  Clinical Narrative:    CM received call from [facility] rep stating after chart review, agency is unable to provide [facility] services.  CM has reinitiated [facility] agency search.  Expected Discharge Plan: [place] Barriers to Discharge: Continued Medical Work up  Expected Discharge Plan and Services Expected Discharge Plan: [place]   Discharge Planning Services: CM Consult Post Acute Care Choice: [place] Living arrangements for the past 2 months: [place] Expected Discharge Date: 04/15/20                         HH Arranged: RN,PT,OT [place] Agency: [facility] Date [place]: 04/19/20 Time [place]: [id number] Representative spoke with at [place]: [place] ([place]) Interventions    Readmission Risk Interventions Readmission Risk Prevention Plan 03/18/2020 10/21/2019  Transportation Screening Complete Complete  Medication Review [employer]) - Complete  PCP or Specialist appointment within 3-5 days of discharge Not Complete Complete  PCP/Specialist Appt Not Complete comments First available appt 12/1 -  [place] or [place] Complete Complete  SW Recovery Care/Counseling Consult Complete Complete  Palliative Care Screening Not Applicable Not [place] Not Applicable Not Applicable  Some recent data might be hidden

## 2020-04-19 NOTE — TOC Progression Note (Signed)
Transition of Care [facility]) - Progression Note    Patient Details  Name: Morgan Beasley MRN: [id number] Date of Birth: [birth year]  Transition of Care [facility]) CM/SW Contact  Joaquin Courts, RN Phone Number: 04/19/2020, 2:20 PM  Clinical Narrative:    CM followed up with [facility]'s rep Narda Rutherford, who has been reviewing patient records for admission and reports that due to patient behavior's and refusal of care, facility will be unable to accept patient.  CM spoke with patient's daughter and discussed this information, went over the fact that no other facilities have extended bed offers.  Daughter asked about geri psych, however patient has been psychiatrically cleared at this time.  Daughter reports that patient can come to home with her.  CM discussed [place] services and arranged for [facility] to provide HHPT/OT/RN services.   Expected Discharge Plan: [place] Barriers to Discharge: Continued Medical Work up  Expected Discharge Plan and Services Expected Discharge Plan: [place]   Discharge Planning Services: CM Consult Post Acute Care Choice: [place] Living arrangements for the past 2 months: [place] Expected Discharge Date: 04/15/20                         HH Arranged: RN,PT,OT [place] Agency: [facility] Date [place]: 04/19/20 Time [place]: 5790 Representative spoke with at [place]: [place] ([place]) Interventions    Readmission Risk Interventions Readmission Risk Prevention Plan 03/18/2020 10/21/2019  Transportation Screening Complete Complete  Medication Review Press photographer) - Complete  PCP or Specialist appointment within 3-5 days of discharge Not Complete Complete  PCP/Specialist Appt Not Complete comments First available appt 12/1 -  [place] or [place] Complete Complete  SW Recovery Care/Counseling Consult Complete Complete   Palliative Care Screening Not Applicable Not [place] Not Applicable Not Applicable  Some recent data might be hidden

## 2020-04-19 NOTE — Progress Notes (Signed)
Initial Nutrition Assessment  RD working remotely.  DOCUMENTATION CODES:   Obesity unspecified  INTERVENTION:  - will order Glucerna Shake BID, each supplement provides 220 kcal and 10 grams of protein. - will order 30 ml Prosource Plus BID, each supplement provides 100 kcal and 15 grams protein.  - will order 1 tablet multivitamin with minerals/day. - complete NFPE at follow-up.   NUTRITION DIAGNOSIS:   Increased nutrient needs related to acute illness,wound healing as evidenced by estimated needs.  GOAL:   Patient will meet greater than or equal to 90% of their needs  MONITOR:   PO intake,Supplement acceptance,Labs,Weight trends  REASON FOR ASSESSMENT:   Malnutrition Screening Tool  ASSESSMENT:   59 y.o. female with medical history of depression, bipolar disorder, substance abuse, pulmonary valve endocarditis, and multiple CVAs. She presented to the ED due to agitation, hallucinations, and restlessness. Patient was initially managed on Precedex drip in the ICU. She found to have large pleural effusions and pulmonary edema.  Patient started on antipsychotics therapy with improvement of symptoms and is currently awaiting SNF placement. She developed abdominal distention and N/V on 12/19.  Patient is noted to be a/o to self only. She was previously on Carb Modified diet and was eating 0-20% at meals. She was changed to CLD today at 0800 and to NPO at 1035. No intake from breakfast documented.  Weight on admission date of 11/21 was 192 lb and weight on 12/18 was also 192 lb, although weight has fluctuated often throughout hospitalization. No weight recorded from yesterday or today.   Non-pitting edema to RUE and RLE documented in the flow sheet.   Per notes: - N/V and abdominal distention s/p abdominal xray which was unconcerning and plan for CT abdomen/pelvis - possible OSA - chronic pancreatitis - AKI on stage 3 CKD--improved - plan for SNF when able to d/c   Labs  reviewed; CBGs: 113, 140, 137 mg/dl, Cl: 94 mmol/l, BUN: 21 mg/dl, creatinine: 1.36 mg/dl, Ca: 8.3 mg/dl, Mg: 1.3 mg/dl, GFR: 45 ml/min. Medications reviewed; 10 mg rectal dulcolax/day, 100 mg colace BID, sliding scale novolog, 10 units lantus/day, 72000 units creon TID, 2 g IV Mg sulfate x1 run 12/20, 17 g miralax/day. IVF; NS @ 75 ml/hr.    NUTRITION - FOCUSED PHYSICAL EXAM:  unable to complete at this time.   Diet Order:   Diet Order            Diet NPO time specified  Diet effective now           Diet Carb Modified                 EDUCATION NEEDS:   Not appropriate for education at this time  Skin:  Skin Integrity Issues:: Other (Comment) Other: non-healing L foot wound; non-pressure injury  Last BM:  12/19  Height:   Ht Readings from Last 1 Encounters:  03/22/20 5\' 6"  (1.676 m)    Weight:   Wt Readings from Last 1 Encounters:  04/17/20 87.2 kg     Estimated Nutritional Needs:  Kcal:  1800-2000 kcal Protein:  90-105 grams Fluid:  >/= 2 L/day      Jarome Matin, MS, RD, LDN, CNSC Inpatient Clinical Dietitian RD pager # available in [facility]  After hours/weekend pager # available in [facility]

## 2020-04-19 NOTE — Progress Notes (Signed)
   04/19/20 0740  Assess: if the MEWS score is Yellow or Red  Were vital signs taken at a resting state? Yes  Focused Assessment No change from prior assessment  Early Detection of Sepsis Score *See Row Information* Low  MEWS guidelines implemented *See Row Information* Yes  Take Vital Signs  Increase Vital Sign Frequency  Yellow: Q 2hr X 2 then Q 4hr X 2, if remains yellow, continue Q 4hrs  Escalate  MEWS: Escalate Yellow: discuss with charge nurse/RN and consider discussing with provider and RRT  Notify: Charge Nurse/RN  Name of Charge Nurse/RN Notified Marissa RN  Date Charge Nurse/RN Notified 04/19/20  Time Charge Nurse/RN Notified [phone]

## 2020-04-20 ENCOUNTER — Inpatient Hospital Stay (HOSPITAL_COMMUNITY): Payer: Medicare Other

## 2020-04-20 LAB — GLUCOSE, CAPILLARY
Glucose-Capillary: 162 mg/dL — ABNORMAL HIGH (ref 70–99)
Glucose-Capillary: 167 mg/dL — ABNORMAL HIGH (ref 70–99)
Glucose-Capillary: 134 mg/dL — ABNORMAL HIGH (ref 70–99)
Glucose-Capillary: 191 mg/dL — ABNORMAL HIGH (ref 70–99)

## 2020-04-20 LAB — MAGNESIUM: Magnesium: 1.4 mg/dL — ABNORMAL LOW (ref 1.7–2.4)

## 2020-04-20 LAB — BASIC METABOLIC PANEL
Anion gap: 17 — ABNORMAL HIGH (ref 5–15)
BUN: 28 mg/dL — ABNORMAL HIGH (ref 6–20)
CO2: 31 mmol/L (ref 22–32)
Calcium: 7.9 mg/dL — ABNORMAL LOW (ref 8.9–10.3)
Chloride: 90 mmol/L — ABNORMAL LOW (ref 98–111)
Creatinine, Ser: 1.46 mg/dL — ABNORMAL HIGH (ref 0.44–1.00)
GFR, Estimated: 41 mL/min — ABNORMAL LOW (ref >60)
Glucose, Bld: 176 mg/dL — ABNORMAL HIGH (ref 70–99)
Potassium: 3.5 mmol/L (ref 3.5–5.1)
Sodium: 138 mmol/L (ref 135–145)

## 2020-04-20 MED ORDER — MAGNESIUM SULFATE 2 GM/50ML IV SOLN
2.0000 g | Freq: Once | INTRAVENOUS | Status: AC
  Administered 2020-04-20: 2 g via INTRAVENOUS
  Filled 2020-04-20: qty 50

## 2020-04-20 MED ORDER — POTASSIUM CHLORIDE 10 MEQ/100ML IV SOLN
10.0000 meq | INTRAVENOUS | Status: AC
  Administered 2020-04-20: 10 meq via INTRAVENOUS
  Filled 2020-04-20: qty 100

## 2020-04-20 NOTE — Progress Notes (Addendum)
PROGRESS NOTE  Taeja Patmon Risinger [id number] DOB: [birth year] DOA: 03/21/2020 PCP: Riki Sheer, NP   LOS: 30 days   Brief narrative: Lovenia Pape is a 59 y.o. female with past medical history of depression, bipolar disorder, substance abuse, pulmonary valve endocarditis, multiple CVAs presented to the hospital secondary to agitation, hallucinations, restlessness.  Patient was initially managed on Precedex drip in the ICU.  She found to have large pleural effusions with pulmonary edema as well.  Patient started on antipsychotics therapy with improvement of symptoms and is currently off one-to-one sitter. Awaiting for skilled nursing facility placement.  Patient developed abdominal distention nausea,vomiting on 04/18/2020, CT abdomen of the abdomen showed small bowel distention.  Assessment/Plan:  Principal Problem:   Acute metabolic encephalopathy Active Problems:   Peripheral neuropathy   COPD (chronic obstructive pulmonary disease) (HCC)   Diabetic peripheral neuropathy associated with type 2 diabetes mellitus ([place])   Cerebral embolism with cerebral infarction   Mixed hyperlipidemia  Nausea, vomiting, abdominal distention.    Abdominal x-ray with distended small bowel without fluid levels.  CT scan of the abdomen pelvis with distended bowel movements.  Possibility of small bowel ileus/obstruction, currently NPO. Repeat abdominal x-ray today with decreasing distention. Hold her Lipitor Lasix and Plavix for now. Continue to monitor electrolytes. Latest potassium 3.8. Magnesium was 1.4. Will need to replenish. She does not wish to have an IV access. Patient has been refusing NG tube as well. She has been refusing Dulcolax suppository. I have updated the family about it. I have tried multiple times to talk to the patient about it.  Psychosis, delirium, bipolar disorder Off one-to-one sitter. Reassessed by psychiatry on 04/16/2020. On Elavil, BuSpar, Tegretol, Zyprexa. Initial  plan was for inpatient geri psych admission but now patient will be considered for disposition home.  Acute respiratory failure with hypoxia Resolved. Thought to be secondary to CHF obstructive sleep apnea and COPD. Currently at room air.  Acute systolic heart failure 2D echocardiogram from 03/2020 significant for an EF of 40% with associated moderately reduced right ventricular systolic function with left ventricular enlargement.  Currently euvolemic and is on oral metoprolol and oral Lasix.  Hold Lasix, Lipitor and Plavix for now due to ileus. Will need to resume when p.o. okay.  Possible obstructive sleep apnea Patient noted to have intermittent apneic spells while sleeping in hospitalization.. Recommendation for outpatient sleep study.  Has remained stable.  History of CVA Imaging of the brain showed right/left occipital infarcts in addition to left basal ganglia/thalamic microhemorahages.  Plan is to continue Plavix and Lipitor on discharge.  Diabetes mellitus, type II Controlled at this time.  Continue lantus and sliding scale insulin. Closely monitor.   Chronic pancreatitis On Creon  Acute kidney injury on CKD stage IIIa Improved.  Recent creatinine of 0.9.  Mild hypokalemia. Potassium 3.5 today. Once the IV access is possible we will need to replenish more. Possibility of small bowel ileus.  Mild hypomagnesemia.  Will replenish with IV magnesium when IV is accessible. Patient refusing IV access.  Anemia of chronic disease Currently stable. Iron panel is unremarkable.  Right supraclavicular/mediastinal adenopathy  history of breast cancer treated with mastectomy, will need outpatient oncology follow-up.  Decreased pedal pulses/Multiple LE wounds secondary to peripheral vascular disease.  Right leg pain.  Diabetic peripheral neuropathy. Follows up with vascular surgery as outpatient.  Continue Plavix, Lipitor.  X-ray of the tibia-fibula on 12/11 was negative for  acute fracture.  Complains of mild pain.  Amitriptyline 100 mg  at bedtime.  Recent serratia bacteremia Was on cefepime initially.  Off antibiotic at this time  DVT prophylaxis: SCDs Start: 03/21/20 1629  Code Status: Full code  Family Communication:  I again spoke with the patient's daughter Ms. [name] on and updated her about the clinical condition of the patient. She indicated that she will be in the evening to see her. Hopefully patient might agree to her daughter's recommendations.  Status is: Inpatient  Remains inpatient appropriate because: Debility, deconditioning, abdominal distention, vomiting, ileus, refusal to meds and intervention.  Dispo: The patient is from: Home              Anticipated d/c is to: Likely home with home health.  Home healt PT OT order placed in.              Anticipated d/c date is: 1 to 2 days once her ileus resolved.              Patient currently is not medically stable to d/c secondary to abdominal distention and possible ileus/obstruction.    Consultants:  PCCM  ID  Psychiatry  Procedures:  None  Antibiotics:  . None at this time  Anti-infectives (From admission, onward)   Start     Dose/Rate Route Frequency Ordered Stop   03/21/20 1800  ceFEPIme (MAXIPIME) 2 g in sodium chloride 0.9 % 100 mL IVPB        2 g 200 mL/hr over 30 Minutes Intravenous Every 8 hours 03/21/20 1621 03/22/20 1332     Subjective: Today, patient was seen and examined at bedside. No nausea vomiting today. Patient refusing NG tube, suppository, IV access.  Objective: Vitals:   04/20/20 0726 04/20/20 1148  BP:  (!) 153/84  Pulse:  (!) 109  Resp:  18  Temp:  98 F (36.7 C)  SpO2: 93% 91%    Intake/Output Summary (Last 24 hours) at 04/20/2020 1223 Last data filed at 04/20/2020 0600 Gross per 24 hour  Intake 1.91 ml  Output --  Net 1.91 ml   Filed Weights   04/12/20 0500 04/13/20 0627 04/17/20 0600  Weight: 90.2 kg 89.1 kg 87.2 kg   Body mass  index is 31.03 kg/m.   Physical Exam: General: Obese built, in mild distress due to abdomen distention, alert awake and communicative HENT:   No scleral pallor or icterus noted. Oral mucosa is dry Chest:    Diminished breath sounds bilaterally. No crackles or wheezes.  CVS: S1 &S2 heard. No murmur.  Regular rate and rhythm. Abdomen: Soft, tympanic abdomen, distended,  bowel sounds heard  Extremities: No cyanosis, clubbing but bilateral lower extremity edema, right second toe dried wound, peripheral pulses are palpable. Psych: Alert, awake and communicative, flat affect, anxious at times, CNS:  No cranial nerve deficits.  Power equal in all extremities.   Skin: Warm and dry. Edema of the lower extremities.  Data Review: I have personally reviewed the following laboratory data and studies,  CBC: Recent Labs  Lab 04/19/20 1315  WBC 9.8  HGB 13.6  HCT 43.8  MCV 76.7*  PLT 99991111*   Basic Metabolic Panel: Recent Labs  Lab 04/17/20 0557 04/18/20 1148 04/19/20 1315 04/20/20 0708  NA 137 137 138 138  K 3.1* 3.8 4.5 3.5  CL 96* 102 94* 90*  CO2 28 22 32 31  GLUCOSE 175* 143* 158* 176*  BUN 21* 17 21* 28*  CREATININE 1.31* 0.91 1.36* 1.46*  CALCIUM 8.1* 7.9* 8.3* 7.9*  MG  --   --  1.3* 1.4*   Liver Function Tests: No results for input(s): AST, ALT, ALKPHOS, BILITOT, PROT, ALBUMIN in the last 168 hours. No results for input(s): LIPASE, AMYLASE in the last 168 hours. No results for input(s): AMMONIA in the last 168 hours. Cardiac Enzymes: No results for input(s): CKTOTAL, CKMB, CKMBINDEX, TROPONINI in the last 168 hours. BNP (last 3 results) Recent Labs    03/04/20 0537 03/21/20 0748 03/24/20 0156  BNP 2,502.5* 1,352.8* 989.2*    ProBNP (last 3 results) Recent Labs    12/19/19 1546 02/09/20 0825  PROBNP 1,148* 1,185*    CBG: Recent Labs  Lab 04/19/20 0731 04/19/20 1158 04/19/20 1632 04/20/20 0728 04/20/20 1144  GLUCAP 113* 140* 137* 162* 167*   Recent  Results (from the past 240 hour(s))  Resp Panel by RT-PCR (Flu A&B, Covid) Nasopharyngeal Swab     Status: None   Collection Time: 04/15/20 10:20 AM   Specimen: Nasopharyngeal Swab; Nasopharyngeal(NP) swabs in vial transport medium  Result Value Ref Range Status   SARS Coronavirus 2 by RT PCR NEGATIVE NEGATIVE Final    Comment: (NOTE) SARS-CoV-2 target nucleic acids are NOT DETECTED.  The SARS-CoV-2 RNA is generally detectable in upper respiratory specimens during the acute phase of infection. The lowest concentration of SARS-CoV-2 viral copies this assay can detect is 138 copies/mL. A negative result does not preclude SARS-Cov-2 infection and should not be used as the sole basis for treatment or other patient management decisions. A negative result may occur with  improper specimen collection/handling, submission of specimen other than nasopharyngeal swab, presence of viral mutation(s) within the areas targeted by this assay, and inadequate number of viral copies(<138 copies/mL). A negative result must be combined with clinical observations, patient history, and epidemiological information. The expected result is Negative.  Fact Sheet for Patients:  EntrepreneurPulse.com.au  Fact Sheet for Healthcare Providers:  IncredibleEmployment.be  This test is no t yet approved or cleared by the [place] FDA and  has been authorized for detection and/or diagnosis of SARS-CoV-2 by FDA under an Emergency Use Authorization (EUA). This EUA will remain  in effect (meaning this test can be used) for the duration of the COVID-19 declaration under Section 564(b)(1) of the Act, 21 U.S.C.section 360bbb-3(b)(1), unless the authorization is terminated  or revoked sooner.       Influenza A by PCR NEGATIVE NEGATIVE Final   Influenza B by PCR NEGATIVE NEGATIVE Final    Comment: (NOTE) The Xpert Xpress SARS-CoV-2/FLU/RSV plus assay is intended as an aid in the  diagnosis of influenza from Nasopharyngeal swab specimens and should not be used as a sole basis for treatment. Nasal washings and aspirates are unacceptable for Xpert Xpress SARS-CoV-2/FLU/RSV testing.  Fact Sheet for Patients: EntrepreneurPulse.com.au  Fact Sheet for Healthcare Providers: IncredibleEmployment.be  This test is not yet approved or cleared by the [place] FDA and has been authorized for detection and/or diagnosis of SARS-CoV-2 by FDA under an Emergency Use Authorization (EUA). This EUA will remain in effect (meaning this test can be used) for the duration of the COVID-19 declaration under Section 564(b)(1) of the Act, 21 U.S.C. section 360bbb-3(b)(1), unless the authorization is terminated or revoked.  Performed at [facility], [place] [address] [place], [place] [address]      Studies: CT ABDOMEN PELVIS WO CONTRAST  Result Date: 04/19/2020 CLINICAL DATA:  Suspected bowel obstruction. Abdominal pain and distension. EXAM: CT ABDOMEN AND PELVIS WITHOUT CONTRAST TECHNIQUE: Multidetector CT imaging of the abdomen and pelvis was performed  following the standard protocol without IV contrast. COMPARISON:  Ultrasound abdomen 02/28/2020 FINDINGS: Lower chest: Trace to small right pleural effusion. Associated passive atelectasis of the right lower lobe. Linear atelectasis versus scarring within the left lower lobe and lingula. Hepatobiliary: No focal liver abnormality. No gallstones, gallbladder wall thickening, or pericholecystic fluid. No biliary dilatation. Pancreas: No focal lesion. Normal pancreatic contour. No surrounding inflammatory changes. No main pancreatic ductal dilatation. Spleen: Normal in size without focal abnormality. Adrenals/Urinary Tract: No adrenal nodule bilaterally. No nephrolithiasis, no hydronephrosis, and no contour-deforming renal mass. No ureterolithiasis or hydroureter. The urinary bladder is  unremarkable. Stomach/Bowel: Stomach is within normal limits. Fluid distension of the small bowel with caliber measuring up to 3.2 cm and associated air-fluid levels. No pneumatosis noted. The small bowel slowly tapers two a normal caliber with no definite transition point noted. The distal ileum is noted to be collapsed however. No evidence of large bowel wall thickening or dilatation. Appendix appears normal. Vascular/Lymphatic: No abdominal aorta or iliac aneurysm. Mild atherosclerotic plaque of the aorta and its branches. No abdominal, pelvic, or inguinal lymphadenopathy. Reproductive: Status post hysterectomy. No adnexal masses. Other: Trace simple free fluid within the pelvis. No intraperitoneal free gas. No organized fluid collection. Musculoskeletal: Mild subcutaneus soft tissue edema. Healed anterior abdominal incision. Tiny fat containing umbilical hernia with abdominal defect 0.8 cm. No suspicious lytic or blastic osseous lesions. No acute displaced fracture. Multilevel degenerative changes of the spine. IMPRESSION: 1. Partial/early distal small bowel obstruction with no definite transition point. Differential diagnosis includes ileus. Associated gastric distension with intraluminal fluid- please consider enteric tube placement. No associated bowel obstruction. Limited evaluation for bowel ischemia on this noncontrast study. 2. Trace to small volume right pleural effusion. 3. Nonspecific trace simple free fluid within the pelvis. Electronically Signed   By: Iven Finn M.D.   On: 04/19/2020 15:27   DG Abd 1 View  Result Date: 04/20/2020 CLINICAL DATA:  59 year old female with history of ileus versus small-bowel obstruction. EXAM: ABDOMEN - 1 VIEW COMPARISON:  04/19/2020 FINDINGS: Slightly decreased prominence of previously visualized, moderately distended loops of gas-filled small bowel in the mid abdomen. There is a paucity of colonic gas. No acute osseous abnormality. IMPRESSION: Slight  interval decreased burden of gas-filled loops of small bowel, suggestive of resolving ileus versus partial small bowel obstruction. Electronically Signed   By: Ruthann Cancer MD   On: 04/20/2020 08:47   DG Abd 1 View  Result Date: 04/19/2020 CLINICAL DATA:  Abdominal pain.  Vomiting. EXAM: ABDOMEN - 1 VIEW COMPARISON:  CT 03/21/2020. FINDINGS: Multiple distended loops of small bowel noted. Paucity of intra colonic air noted. Small-bowel obstruction cannot be excluded. No free air. Mild degenerative changes lumbar spine and both hips. IMPRESSION: Multiple distended loops of small bowel. Paucity of intra colonic air noted. Small-bowel obstruction cannot be excluded. No free air. CT of the abdomen pelvis may prove useful for further evaluation. Electronically Signed   By: Marcello [place]  Register   On: 04/19/2020 08:32      Flora Lipps, MD  [facility] 04/20/2020  If 7PM-7AM, please contact night-coverage

## 2020-04-20 NOTE — TOC Progression Note (Signed)
Transition of Care [facility]) - Progression Note    Patient Details  Name: Morgan Beasley MRN: [id number] Date of Birth: 1960/08/22  Transition [facility] Care [facility]) CM/SW Contact  Joaquin Courts, RN Phone Number: 04/20/2020, 1:08 PM  Clinical Narrative:    Patient set up with [facility] for HHPT/OT services.   Expected Discharge Plan: [place] Barriers to Discharge: Continued Medical Work up  Expected Discharge Plan and Services Expected Discharge Plan: [place]   Discharge Planning Services: CM Consult Post Acute Care Choice: [place] Living arrangements for the past 2 months: [place] Expected Discharge Date: 04/15/20                         HH Arranged: PT,OT [place]: [place] Date HH Agency Contacted: 04/20/20 Time [place]: 5916 Representative spoke with at [place]: [place] ([place]) Interventions    Readmission Risk Interventions Readmission Risk Prevention Plan 03/18/2020 10/21/2019  Transportation Screening Complete Complete  Medication Review Press photographer) - Complete  PCP or Specialist appointment within 3-5 days of discharge Not Complete Complete  PCP/Specialist Appt Not Complete comments First available appt 12/1 -  [place] or [place] Complete Complete  SW Recovery Care/Counseling Consult Complete Complete  Palliative Care Screening Not Applicable Not [place] Not Applicable Not Applicable  Some recent data might be hidden

## 2020-04-20 NOTE — Plan of Care (Signed)
  Problem: Clinical Measurements: Goal: Respiratory complications will improve Outcome: Progressing Goal: Cardiovascular complication will be avoided Outcome: Progressing   Problem: Elimination: Goal: Will not experience complications related to urinary retention Outcome: Progressing   

## 2020-04-20 NOTE — Plan of Care (Signed)
  Problem: Clinical Measurements: Goal: Respiratory complications will improve Outcome: Progressing   

## 2020-04-20 NOTE — Progress Notes (Signed)
PT Cancellation Note  Patient Details Name: Annalyce Lanpher MRN: [id number] DOB: Oct 31, 1960   Cancelled Treatment:    Reason Eval/Treat Not Completed: Medical issues which prohibited therapy, Had NG tube placed recently. Will check back another time.   Claretha Cooper 04/20/2020, 4:36 PM  [place] Pager [phone] Office [phone]

## 2020-04-21 ENCOUNTER — Inpatient Hospital Stay (HOSPITAL_COMMUNITY): Payer: Medicare Other

## 2020-04-21 DIAGNOSIS — K567 Ileus, unspecified: Secondary | ICD-10-CM

## 2020-04-21 LAB — GLUCOSE, CAPILLARY
Glucose-Capillary: 151 mg/dL — ABNORMAL HIGH (ref 70–99)
Glucose-Capillary: 143 mg/dL — ABNORMAL HIGH (ref 70–99)
Glucose-Capillary: 153 mg/dL — ABNORMAL HIGH (ref 70–99)
Glucose-Capillary: 168 mg/dL — ABNORMAL HIGH (ref 70–99)

## 2020-04-21 NOTE — Progress Notes (Signed)
PROGRESS NOTE    Morgan Beasley  OIL:[id number] DOB: 03-05-1961 DOA: 03/21/2020 PCP: Shanna Cisco, NP    Chief Complaint  Patient presents with  . Weakness  . Shortness of Breath    Brief Narrative:  59 year old lady with prior history of depression, bipolar disorder, substance abuse, pulmonary valve endocarditis and multiple CVAs presents to ED for agitation hallucinations and restlessness requiring Precedex drip in the ICU briefly psychiatric consulted and she was started on antipsychotics with slight improvement she was initially scheduled to be discharged to [facility] psychiatry unit but currently plan is for discharge home patient on April 18 2020 developed some abdominal distention associated with some nausea and vomiting.  CT abdomen and pelvis on 04/19/2020 showed small bowel distention possibly obstruction versus ileus.  An NG tube was placed but was pulled out by the patient last night.  Overnight as per the RN patient had several bouts of vomiting with watery diarrhea. Repeat abdominal film ordered this morning.  Assessment & Plan:   Principal Problem:   Acute metabolic encephalopathy Active Problems:   Peripheral neuropathy   COPD (chronic obstructive pulmonary disease) (HCC)   Diabetic peripheral neuropathy associated with type 2 diabetes mellitus (HCC)   Cerebral embolism with cerebral infarction   Mixed hyperlipidemia   Acute metabolic encephalopathy probably secondary to psychosis in the setting of bipolar disorder. Elavil 100 mg daily at bedtime,, buspirone 10 mg 3 times daily, carbamazepine 400 mg twice daily, olanzapine 5 mg daily at bedtime.    COPD No wheezing heard on exam continue with bronchodilators as needed.    Peripheral neuropathy secondary to type 2 diabetes Continue home with home medications. Recommend outpatient follow-up with vascular surgery.    Type 2 diabetes mellitus CBG (last 3)  Recent Labs    04/20/20 1655  04/20/20 2051 04/21/20 0749  GLUCAP 134* 191* 151*     Acute on chronic systolic heart failure Diuresed appropriately with Lasix.  Currently euvolemic. Patient on Lasix(which is being held for),as pt is NPO.     H/o CVA Imaging of the brain showed right and left occipital infarcts in addition to left basal ganglia and thalamic microhemorrhages Patient on Plavix and Lipitor at home which have been held as patient has ileus and is n.p.o.   History of chronic pancreatitis Continue with Creon    Acute kidney injury on stage III CKD Creatinine appears to be at baseline at this time.    Nausea vomiting and abdominal distention. CT of the abdomen pelvis showed distended small bowel consistent with obstruction versus ileus. NG tube was placed but it was pulled out by the patient last night. Patient currently n.p.o. Overnight patient had multiple bowel watery bowel movements and vomiting. Repeat abdominal film this morning.    Hypokalemia Replaced.   Hypomagnesemia Replaced   Right supraclavicular/mediastinal adenopathy Recommend outpatient follow-up with oncology.     DVT prophylaxis: SCDs Code Status: Full code Family Communication: None at bedside Disposition:   Status is: Inpatient  Remains inpatient appropriate because:Ongoing diagnostic testing needed not appropriate for outpatient work up and Unsafe d/c plan   Dispo: The patient is from: Home              Anticipated d/c is to: Home              Anticipated d/c date is: 2 days              Patient currently is not medically stable to d/c.  Consultants:   None  Procedures: Abdominal x-ray pending Antimicrobials: None  Subjective: No new complaints  Objective: Vitals:   04/20/20 1941 04/20/20 2051 04/21/20 0500 04/21/20 0508  BP:  (!) 146/78  (!) 153/83  Pulse:  (!) 110  93  Resp:  16  20  Temp:  98.2 F (36.8 C)  97.8 F (36.6 C)  TempSrc:    Oral  SpO2: 93% 91%  92%   Weight:   95.6 kg   Height:       No intake or output data in the 24 hours ending 04/21/20 1149 Filed Weights   04/13/20 0627 04/17/20 0600 04/21/20 0500  Weight: 89.1 kg 87.2 kg 95.6 kg    Examination:  General exam: Appears calm and comfortable  Respiratory system: Clear to auscultation. Respiratory effort normal. Cardiovascular system: S1 & S2 heard, RRR. No JVD,No pedal edema. Gastrointestinal system: Abdomen is soft, nontender, distended bowel sounds minimal Central nervous system: Alert but confused Extremities: Symmetric 5 x 5 power. Skin: No rashes, lesions or ulcers Psychiatry: restless confused.     Data Reviewed: I have personally reviewed following labs and imaging studies  CBC: Recent Labs  Lab 04/19/20 1315  WBC 9.8  HGB 13.6  HCT 43.8  MCV 76.7*  PLT 442*    Basic Metabolic Panel: Recent Labs  Lab 04/17/20 0557 04/18/20 1148 04/19/20 1315 04/20/20 0708  NA 137 137 138 138  K 3.1* 3.8 4.5 3.5  CL 96* 102 94* 90*  CO2 28 22 32 31  GLUCOSE 175* 143* 158* 176*  BUN 21* 17 21* 28*  CREATININE 1.31* 0.91 1.36* 1.46*  CALCIUM 8.1* 7.9* 8.3* 7.9*  MG  --   --  1.3* 1.4*    GFR: Estimated Creatinine Clearance: 48.9 mL/min (A) (by C-G formula based on SCr of 1.46 mg/dL (H)).  Liver Function Tests: No results for input(s): AST, ALT, ALKPHOS, BILITOT, PROT, ALBUMIN in the last 168 hours.  CBG: Recent Labs  Lab 04/20/20 0728 04/20/20 1144 04/20/20 1655 04/20/20 2051 04/21/20 0749  GLUCAP 162* 167* 134* 191* 151*     Recent Results (from the past 240 hour(s))  Resp Panel by RT-PCR (Flu A&B, Covid) Nasopharyngeal Swab     Status: None   Collection Time: 04/15/20 10:20 AM   Specimen: Nasopharyngeal Swab; Nasopharyngeal(NP) swabs in vial transport medium  Result Value Ref Range Status   SARS Coronavirus 2 by RT PCR NEGATIVE NEGATIVE Final    Comment: (NOTE) SARS-CoV-2 target nucleic acids are NOT DETECTED.  The SARS-CoV-2 RNA is  generally detectable in upper respiratory specimens during the acute phase of infection. The lowest concentration of SARS-CoV-2 viral copies this assay can detect is 138 copies/mL. A negative result does not preclude SARS-Cov-2 infection and should not be used as the sole basis for treatment or other patient management decisions. A negative result may occur with  improper specimen collection/handling, submission of specimen other than nasopharyngeal swab, presence of viral mutation(s) within the areas targeted by this assay, and inadequate number of viral copies(<138 copies/mL). A negative result must be combined with clinical observations, patient history, and epidemiological information. The expected result is Negative.  Fact Sheet for Patients:  EntrepreneurPulse.com.au  Fact Sheet for Healthcare Providers:  IncredibleEmployment.be  This test is no t yet approved or cleared by the [place] FDA and  has been authorized for detection and/or diagnosis of SARS-CoV-2 by FDA under an Emergency Use Authorization (EUA). This EUA will remain  in effect (meaning this  test can be used) for the duration of the COVID-19 declaration under Section 564(b)(1) of the Act, 21 U.S.C.section 360bbb-3(b)(1), unless the authorization is terminated  or revoked sooner.       Influenza A by PCR NEGATIVE NEGATIVE Final   Influenza B by PCR NEGATIVE NEGATIVE Final    Comment: (NOTE) The Xpert Xpress SARS-CoV-2/FLU/RSV plus assay is intended as an aid in the diagnosis of influenza from Nasopharyngeal swab specimens and should not be used as a sole basis for treatment. Nasal washings and aspirates are unacceptable for Xpert Xpress SARS-CoV-2/FLU/RSV testing.  Fact Sheet for Patients: EntrepreneurPulse.com.au  Fact Sheet for Healthcare Providers: IncredibleEmployment.be  This test is not yet approved or cleared by the [place] FDA and has been authorized for detection and/or diagnosis of SARS-CoV-2 by FDA under an Emergency Use Authorization (EUA). This EUA will remain in effect (meaning this test can be used) for the duration of the COVID-19 declaration under Section 564(b)(1) of the Act, 21 U.S.C. section 360bbb-3(b)(1), unless the authorization is terminated or revoked.  Performed at [facility], [place] [address] [place], [place] [address]          Radiology Studies: CT ABDOMEN PELVIS WO CONTRAST  Result Date: 04/19/2020 CLINICAL DATA:  Suspected bowel obstruction. Abdominal pain and distension. EXAM: CT ABDOMEN AND PELVIS WITHOUT CONTRAST TECHNIQUE: Multidetector CT imaging of the abdomen and pelvis was performed following the standard protocol without IV contrast. COMPARISON:  Ultrasound abdomen 02/28/2020 FINDINGS: Lower chest: Trace to small right pleural effusion. Associated passive atelectasis of the right lower lobe. Linear atelectasis versus scarring within the left lower lobe and lingula. Hepatobiliary: No focal liver abnormality. No gallstones, gallbladder wall thickening, or pericholecystic fluid. No biliary dilatation. Pancreas: No focal lesion. Normal pancreatic contour. No surrounding inflammatory changes. No main pancreatic ductal dilatation. Spleen: Normal in size without focal abnormality. Adrenals/Urinary Tract: No adrenal nodule bilaterally. No nephrolithiasis, no hydronephrosis, and no contour-deforming renal mass. No ureterolithiasis or hydroureter. The urinary bladder is unremarkable. Stomach/Bowel: Stomach is within normal limits. Fluid distension of the small bowel with caliber measuring up to 3.2 cm and associated air-fluid levels. No pneumatosis noted. The small bowel slowly tapers two a normal caliber with no definite transition point noted. The distal ileum is noted to be collapsed however. No evidence of large bowel wall thickening or dilatation. Appendix  appears normal. Vascular/Lymphatic: No abdominal aorta or iliac aneurysm. Mild atherosclerotic plaque of the aorta and its branches. No abdominal, pelvic, or inguinal lymphadenopathy. Reproductive: Status post hysterectomy. No adnexal masses. Other: Trace simple free fluid within the pelvis. No intraperitoneal free gas. No organized fluid collection. Musculoskeletal: Mild subcutaneus soft tissue edema. Healed anterior abdominal incision. Tiny fat containing umbilical hernia with abdominal defect 0.8 cm. No suspicious lytic or blastic osseous lesions. No acute displaced fracture. Multilevel degenerative changes of the spine. IMPRESSION: 1. Partial/early distal small bowel obstruction with no definite transition point. Differential diagnosis includes ileus. Associated gastric distension with intraluminal fluid- please consider enteric tube placement. No associated bowel obstruction. Limited evaluation for bowel ischemia on this noncontrast study. 2. Trace to small volume right pleural effusion. 3. Nonspecific trace simple free fluid within the pelvis. Electronically Signed   By: Iven Finn M.D.   On: 04/19/2020 15:27   DG Abd 1 View  Result Date: 04/20/2020 CLINICAL DATA:  59 year old female status post NG placement. EXAM: ABDOMEN - 1 VIEW COMPARISON:  Earlier radiograph dated 04/20/2020. FINDINGS: Enteric tube with tip just distal to the  GE junction and side port above the GE junction. Recommend further advancing by additional 10 cm. IMPRESSION: Enteric tube with tip just distal to the GE junction. Recommend further advancing by additional 10 cm. Electronically Signed   By: Anner Crete M.D.   On: 04/20/2020 15:43   DG Abd 1 View  Result Date: 04/20/2020 CLINICAL DATA:  59 year old female status post NG placement. EXAM: ABDOMEN - 1 VIEW COMPARISON:  Abdominal radiograph dated 04/20/2020. FINDINGS: Partially visualized enteric tube with side-port over distal esophagus and tip in the region of the GE  junction. Recommend further advancing by at least additional 12 cm. Dilated small bowel loops noted in the abdomen. IMPRESSION: 1. Enteric tube with tip in the region of the GE junction. Recommend further advancing by at least 12 cm. 2. Dilated small bowel loops. Electronically Signed   By: Anner Crete M.D.   On: 04/20/2020 15:42   DG Abd 1 View  Result Date: 04/20/2020 CLINICAL DATA:  59 year old female with history of ileus versus small-bowel obstruction. EXAM: ABDOMEN - 1 VIEW COMPARISON:  04/19/2020 FINDINGS: Slightly decreased prominence of previously visualized, moderately distended loops of gas-filled small bowel in the mid abdomen. There is a paucity of colonic gas. No acute osseous abnormality. IMPRESSION: Slight interval decreased burden of gas-filled loops of small bowel, suggestive of resolving ileus versus partial small bowel obstruction. Electronically Signed   By: Ruthann Cancer MD   On: 04/20/2020 08:47        Scheduled Meds: . (feeding supplement) PROSource Plus  30 mL Oral BID BM  . acetaminophen  650 mg Oral Q6H  . amitriptyline  100 mg Oral QHS  . bisacodyl  10 mg Oral Daily  . bisacodyl  10 mg Rectal Daily  . busPIRone  10 mg Oral TID  . carbamazepine  400 mg Oral BID  . chlorhexidine  15 mL Mouth Rinse BID  . collagenase   Topical Daily  . dicyclomine  10 mg Oral TID AC & HS  . docusate sodium  100 mg Oral BID  . feeding supplement (GLUCERNA SHAKE)  237 mL Oral BID BM  . insulin aspart  0-5 Units Subcutaneous QHS  . insulin aspart  0-6 Units Subcutaneous TID WC  . insulin glargine  10 Units Subcutaneous QHS  . lipase/protease/amylase  72,000 Units Oral TID WC  . mouth rinse  15 mL Mouth Rinse q12n4p  . metoprolol succinate  50 mg Oral Daily  . mometasone-formoterol  2 puff Inhalation BID  . multivitamin with minerals  1 tablet Oral Daily  . nicotine  14 mg Transdermal Daily  . OLANZapine  5 mg Oral QHS  . polyethylene glycol  17 g Oral Daily   Continuous  Infusions:   LOS: 31 days        Hosie Poisson, MD [facility]   To contact the attending provider between 7A-7P or the covering provider during after hours 7P-7A, please log into the web site [email or web address] and access using universal [name] password for that web site. If you do not have the password, please call the hospital operator.  04/21/2020, 11:49 AM

## 2020-04-21 NOTE — TOC Progression Note (Signed)
Transition of Care [facility]) - Progression Note    Patient Details  Name: Morgan Beasley MRN: [id number] Date of Birth: [birth year]  Transition of Care [facility]) CM/SW Contact  Verlyn Lambert, [name], RN Phone Number: 04/21/2020, 10:40 AM  Clinical Narrative: [facility] following for HHPT/OT;Ro tech rep Jermaine-hospital bed-await order. PTAR for transport @ d/c. Address will stay:[name](spouse of dtr-[name][address].      Expected Discharge Plan: [place] Barriers to Discharge: Continued Medical Work up  Expected Discharge Plan and Services Expected Discharge Plan: [place]   Discharge Planning Services: CM Consult Post Acute Care Choice: [place] Living arrangements for the past 2 months: [place] Expected Discharge Date: 04/15/20                         HH Arranged: PT,OT [place]: [place] Date HH Agency Contacted: 04/20/20 Time [place]: 1694 Representative spoke with at [place]: [place] ([place]) Interventions    Readmission Risk Interventions Readmission Risk Prevention Plan 03/18/2020 10/21/2019  Transportation Screening Complete Complete  Medication Review Press photographer) - Complete  PCP or Specialist appointment within 3-5 days of discharge Not Complete Complete  PCP/Specialist Appt Not Complete comments First available appt 12/1 -  [place] or [place] Complete Complete  SW Recovery Care/Counseling Consult Complete Complete  Palliative Care Screening Not Applicable Not [place] Not Applicable Not Applicable  Some recent data might be hidden

## 2020-04-21 NOTE — Plan of Care (Signed)
Patient has has several loose Bms this shift. At start of shift patient was agitated and refusing help from staff, as the day has progressed she has been calm and easily redirected with a sitters presence. Continues to be NPO, have given her a few PO meds with sips of clear liquids. Patient does sat shes hungry. Safety maintained, will continue to monitor.  Problem: Clinical Measurements: Goal: Ability to maintain clinical measurements within normal limits will improve Outcome: Progressing Goal: Respiratory complications will improve Outcome: Progressing   Problem: Elimination: Goal: Will not experience complications related to urinary retention Outcome: Progressing   Problem: Safety: Goal: Ability to remain free from injury will improve Outcome: Progressing   Problem: Health Behavior/Discharge Planning: Goal: Ability to manage health-related needs will improve Outcome: Not Progressing   Problem: Clinical Measurements: Goal: Will remain free from infection Outcome: Not Progressing Goal: Diagnostic test results will improve Outcome: Not Progressing Goal: Cardiovascular complication will be avoided Outcome: Not Progressing   Problem: Nutrition: Goal: Adequate nutrition will be maintained Outcome: Not Progressing   Problem: Elimination: Goal: Will not experience complications related to bowel motility Outcome: Not Progressing   Problem: Pain Managment: Goal: General experience of comfort will improve Outcome: Not Progressing   Problem: Skin Integrity: Goal: Risk for impaired skin integrity will decrease Outcome: Not Progressing   Problem: Education: Goal: Ability to incorporate positive changes in behavior to improve self-esteem will improve Outcome: Not Progressing   Problem: Health Behavior/Discharge Planning: Goal: Ability to identify and utilize available resources and services will improve Outcome: Not Progressing Goal: Ability to remain free from injury will  improve Outcome: Not Progressing   Problem: Self-Concept: Goal: Will verbalize positive feelings about self Outcome: Not Progressing   Problem: Skin Integrity: Goal: Demonstration of wound healing without infection will improve Outcome: Not Progressing   Problem: Education: Goal: Ability to demonstrate management of disease process will improve Outcome: Not Progressing Goal: Ability to verbalize understanding of medication therapies will improve Outcome: Not Progressing   Problem: Activity: Goal: Capacity to carry out activities will improve Outcome: Not Progressing   Problem: Cardiac: Goal: Ability to achieve and maintain adequate cardiopulmonary perfusion will improve Outcome: Not Progressing

## 2020-04-22 ENCOUNTER — Inpatient Hospital Stay (HOSPITAL_COMMUNITY): Payer: Medicare Other

## 2020-04-22 LAB — CBC WITH DIFFERENTIAL/PLATELET
Abs Immature Granulocytes: 0.07 10*3/uL (ref 0.00–0.07)
Basophils Absolute: 0 10*3/uL (ref 0.0–0.1)
Basophils Relative: 0 %
Eosinophils Absolute: 0.2 10*3/uL (ref 0.0–0.5)
Eosinophils Relative: 2 %
HCT: 38.4 % (ref 36.0–46.0)
Hemoglobin: 11.8 g/dL — ABNORMAL LOW (ref 12.0–15.0)
Immature Granulocytes: 1 %
Lymphocytes Relative: 12 %
Lymphs Abs: 1.1 10*3/uL (ref 0.7–4.0)
MCH: 23.9 pg — ABNORMAL LOW (ref 26.0–34.0)
MCHC: 30.7 g/dL (ref 30.0–36.0)
MCV: 77.7 fL — ABNORMAL LOW (ref 80.0–100.0)
Monocytes Absolute: 1.1 10*3/uL — ABNORMAL HIGH (ref 0.1–1.0)
Monocytes Relative: 12 %
Neutro Abs: 6.6 10*3/uL (ref 1.7–7.7)
Neutrophils Relative %: 73 %
Platelets: 458 10*3/uL — ABNORMAL HIGH (ref 150–400)
RBC: 4.94 MIL/uL (ref 3.87–5.11)
RDW: 22 % — ABNORMAL HIGH (ref 11.5–15.5)
WBC: 9.1 10*3/uL (ref 4.0–10.5)
nRBC: 0 % (ref 0.0–0.2)

## 2020-04-22 LAB — COMPREHENSIVE METABOLIC PANEL
ALT: 15 U/L (ref 0–44)
AST: 25 U/L (ref 15–41)
Albumin: 2.2 g/dL — ABNORMAL LOW (ref 3.5–5.0)
Alkaline Phosphatase: 157 U/L — ABNORMAL HIGH (ref 38–126)
Anion gap: 13 (ref 5–15)
BUN: 31 mg/dL — ABNORMAL HIGH (ref 6–20)
CO2: 35 mmol/L — ABNORMAL HIGH (ref 22–32)
Calcium: 8 mg/dL — ABNORMAL LOW (ref 8.9–10.3)
Chloride: 91 mmol/L — ABNORMAL LOW (ref 98–111)
Creatinine, Ser: 1.43 mg/dL — ABNORMAL HIGH (ref 0.44–1.00)
GFR, Estimated: 43 mL/min — ABNORMAL LOW (ref >60)
Glucose, Bld: 109 mg/dL — ABNORMAL HIGH (ref 70–99)
Potassium: 2.8 mmol/L — ABNORMAL LOW (ref 3.5–5.1)
Sodium: 139 mmol/L (ref 135–145)
Total Bilirubin: 0.8 mg/dL (ref 0.3–1.2)
Total Protein: 6.6 g/dL (ref 6.5–8.1)

## 2020-04-22 LAB — GLUCOSE, CAPILLARY
Glucose-Capillary: 67 mg/dL — ABNORMAL LOW (ref 70–99)
Glucose-Capillary: 72 mg/dL (ref 70–99)
Glucose-Capillary: 82 mg/dL (ref 70–99)
Glucose-Capillary: 108 mg/dL — ABNORMAL HIGH (ref 70–99)

## 2020-04-22 LAB — MAGNESIUM: Magnesium: 1.9 mg/dL (ref 1.7–2.4)

## 2020-04-22 MED ORDER — DOCUSATE SODIUM 100 MG PO CAPS
100.0000 mg | ORAL_CAPSULE | Freq: Two times a day (BID) | ORAL | Status: DC | PRN
  Administered 2020-04-23: 100 mg via ORAL
  Filled 2020-04-22: qty 1

## 2020-04-22 MED ORDER — POLYETHYLENE GLYCOL 3350 17 G PO PACK: 17.0000 g | PACK | Freq: Every day | ORAL | Status: DC | PRN

## 2020-04-22 MED ORDER — ATORVASTATIN CALCIUM 20 MG PO TABS
20.0000 mg | ORAL_TABLET | Freq: Every day | ORAL | Status: DC
  Administered 2020-04-23 – 2020-04-28 (×4): 20 mg via ORAL
  Filled 2020-04-22 (×4): qty 1

## 2020-04-22 MED ORDER — INSULIN GLARGINE 100 UNIT/ML ~~LOC~~ SOLN
5.0000 [IU] | Freq: Every day | SUBCUTANEOUS | Status: DC
  Administered 2020-04-26 – 2020-04-27 (×2): 5 [IU] via SUBCUTANEOUS
  Filled 2020-04-22 (×7): qty 0.05

## 2020-04-22 MED ORDER — POTASSIUM CHLORIDE CRYS ER 20 MEQ PO TBCR
40.0000 meq | EXTENDED_RELEASE_TABLET | Freq: Two times a day (BID) | ORAL | Status: AC
  Administered 2020-04-22: 40 meq via ORAL
  Filled 2020-04-22 (×2): qty 2

## 2020-04-22 MED ORDER — CLOPIDOGREL BISULFATE 75 MG PO TABS
75.0000 mg | ORAL_TABLET | Freq: Every day | ORAL | Status: DC
  Administered 2020-04-23 – 2020-04-28 (×4): 75 mg via ORAL
  Filled 2020-04-22 (×4): qty 1

## 2020-04-22 MED ORDER — SODIUM CHLORIDE 0.9% FLUSH
10.0000 mL | Freq: Two times a day (BID) | INTRAVENOUS | Status: DC
  Administered 2020-04-23 – 2020-04-24 (×2): 10 mL

## 2020-04-22 MED ORDER — SODIUM CHLORIDE 0.9% FLUSH
10.0000 mL | INTRAVENOUS | Status: DC | PRN
Start: 2020-04-22 — End: 2020-04-29

## 2020-04-22 NOTE — Progress Notes (Signed)
Physical Therapy Treatment Patient Details Name: Morgan Beasley MRN: [id number] DOB: Jan 10, 1961 Today's Date: 04/22/2020    History of Present Illness Pt admitted from home 03/21/20  and dx with acute respiratory failure 2* pleural effusions and pulmonary edema as well as acute metabolic encephalopathy and cerebral embolism with cerebral infarction.  Pt with hx of PAD, Lymphadema, DM, diabetic neuropathy, COPD, CKD, polysubstance abuse, bipolar, and fem-pop bypass graft.    PT Comments    Pt sitting in recliner.  General Comments: improving cognition, following all commands "hungry" still present with slwo, delayed processing and at times requires repeat VC's to stay on task.  General transfer comment: Min assist to stand from recliner. Required two attempts as patient leaning backwards. Repeat VC's to push up from chair and forward weight shift needed.  Also assisted with a toilet transfer using hand rail to steady self.  Able to perform self peri care but therapist needed to steady pt.  Slow/delayed correction response.  General Gait Details: amb hand held assist to bathroom very unsteady.  Used 4YH Rollator this session for balance instability but required instruction for proper use and safety with turns. Assisted back to room and positioned in recliner.  Pt asking for crackers.  Currently on clear liquid diet due to an Ileus.   Follow Up Recommendations  SNF     Equipment Recommendations  None recommended by PT    Recommendations for Other Services       Precautions / Restrictions Precautions Precautions: Fall Restrictions Weight Bearing Restrictions: No    Mobility  Bed Mobility               General bed mobility comments: OOB in recliner  Transfers Overall transfer level: Needs assistance Equipment used: 4-wheeled walker;1 person hand held assist Transfers: Sit to/from [employer] Sit to Stand: Min assist;Min guard Stand pivot transfers: Min  assist       General transfer comment: Min assist to stand from recliner. Required two attempts as patient leaning backwards. Repeat VC's to push up from chair and forward weight shift needed.  Also assisted with a toilet transfer using hand rail to steady self.  Able to perform self peri care but therapist needed to steady pt.  Slow/delayed correction responce  Ambulation/Gait Ambulation/Gait assistance: Min Chemical engineer (Feet): 65 Feet Assistive device: 4-wheeled walker Gait Pattern/deviations: Step-through pattern;Decreased stride length;Drifts right/left Gait velocity: decreased   General Gait Details: amb hand held assist to bathroom very unsteady.  Used 0WC Rollator this session for balance instability but required instruction for proper use and safety with turns.   Stairs             Wheelchair Mobility    Modified Rankin (Stroke Patients Only)       Balance                                            Cognition Arousal/Alertness: Awake/alert Behavior During Therapy: WFL for tasks assessed/performed Overall Cognitive Status: No family/caregiver present to determine baseline cognitive functioning                                 General Comments: improving cognition, following all commands "hungry" still present with slwo, delayed processing and at times requires repeat VC's to stay on task  Exercises      General Comments        Pertinent Vitals/Pain Pain Assessment: Faces Faces Pain Scale: Hurts a little bit Pain Location: B feet "swelling" Pain Descriptors / Indicators: Grimacing;Discomfort Pain Intervention(s): Monitored during session    Home Living                      Prior Function            PT Goals (current goals can now be found in the care plan section) Progress towards PT goals: Progressing toward goals    Frequency    Min 2X/week      PT Plan Current plan remains  appropriate    Co-evaluation              AM-PAC PT "6 Clicks" Mobility   Outcome Measure  Help needed turning from your back to your side while in a flat bed without using bedrails?: A Little Help needed moving from lying on your back to sitting on the side of a flat bed without using bedrails?: A Little Help needed moving to and from a bed to a chair (including a wheelchair)?: A Little Help needed standing up from a chair using your arms (e.g., wheelchair or bedside chair)?: A Little Help needed to walk in hospital room?: A Little Help needed climbing 3-5 steps with a railing? : A Lot 6 Click Score: 17    End of Session Equipment Utilized During Treatment: Gait belt Activity Tolerance: Patient limited by lethargy Patient left: with call bell/phone within reach;in chair;with chair alarm set Nurse Communication: Mobility status PT Visit Diagnosis: Muscle weakness (generalized) (M62.81);Unsteadiness on feet (R26.81);Pain     Time: 4259-5638 PT Time Calculation (min) (ACUTE ONLY): 27 min  Charges:  $Gait Training: 8-22 mins $Therapeutic Activity: 8-22 mins                     Rica Koyanagi  PTA Acute  Rehabilitation Services Pager      [phone] Office      [phone]

## 2020-04-22 NOTE — Progress Notes (Signed)
PROGRESS NOTE    Morgan Beasley  [id number] DOB: 11/24/1960 DOA: 03/21/2020 PCP: Riki Sheer, NP    Chief Complaint  Patient presents with  . Weakness  . Shortness of Breath    Brief Narrative:  59 year old lady with prior history of depression, bipolar disorder, substance abuse, pulmonary valve endocarditis and multiple CVAs presents to ED for agitation hallucinations and restlessness requiring Precedex drip in the ICU briefly psychiatric consulted and she was started on antipsychotics with slight improvement she was initially scheduled to be discharged to [facility] psychiatry unit but currently plan is for discharge home patient on April 18 2020 developed some abdominal distention associated with some nausea and vomiting.  CT abdomen and pelvis on 04/19/2020 showed small bowel distention possibly obstruction versus ileus.  An NG tube was placed but was pulled out by the patient last night.  Overnight as per the RN patient had several bouts of vomiting with watery diarrhea. Repeat abdominal film showed improvement in the ileus but not resolved.    Assessment & Plan:   Principal Problem:   Acute metabolic encephalopathy Active Problems:   Peripheral neuropathy   COPD (chronic obstructive pulmonary disease) (HCC)   Diabetic peripheral neuropathy associated with type 2 diabetes mellitus (HCC)   Cerebral embolism with cerebral infarction   Mixed hyperlipidemia   Acute metabolic encephalopathy probably secondary to psychosis in the setting of bipolar disorder. Elavil 100 mg daily at bedtime,, buspirone 10 mg 3 times daily, carbamazepine 400 mg twice daily, olanzapine 5 mg daily at bedtime. No changes in meds.  Pt alert and asnwering simple questions.    COPD No wheezing heard on exam continue with bronchodilators as needed.    Peripheral neuropathy secondary to type 2 diabetes Continue home with home medications. Recommend outpatient follow-up with vascular  surgery.    Type 2 diabetes mellitus CBG (last 3)  Recent Labs    04/22/20 0823 04/22/20 1128 04/22/20 1649  GLUCAP 72 82 108*   Continue with SSI.  D/c long acting insulin, until her ileus resolves and she is back on carb modified diet.    Acute on chronic systolic heart failure Diuresed appropriately with Lasix.  Currently euvolemic. Patient on Lasix(which is being held for),as pt is taking very little by mouth.     H/o CVA Imaging of the brain showed right and left occipital infarcts in addition to left basal ganglia and thalamic microhemorrhages Restart plavid and lipitor.    History of chronic pancreatitis Continue with Creon    Acute kidney injury on stage IIIa CKD Creatinine at baseline.      Nausea vomiting and abdominal distention. CT of the abdomen pelvis showed distended small bowel consistent with obstruction versus ileus. NG tube was placed but it was pulled out by the patient .  Multiple BM today, decrease the dose of dulcolax.  Start her on clears.     Hypokalemia Replaced. Repeat in am.    Hypomagnesemia Replaced   Right supraclavicular/mediastinal adenopathy Recommend outpatient follow-up with oncology.     DVT prophylaxis: SCDs Code Status: Full code Family Communication: None at bedside, discussed with daughter over the phone.  Disposition:   Status is: Inpatient  Remains inpatient appropriate because:Ongoing diagnostic testing needed not appropriate for outpatient work up and Unsafe d/c plan   Dispo: The patient is from: Home              Anticipated d/c is to: SNF  Anticipated d/c date is: 2 days              Patient currently is not medically stable to d/c.       Consultants:   None  Procedures: Abdominal x-ray pending Antimicrobials: None  Subjective: Refused labs this am.   Objective: Vitals:   04/21/20 2205 04/22/20 0500 04/22/20 0631 04/22/20 1337  BP: 137/81   (!) 127/97  Pulse: (!)  105   (!) 102  Resp: 18   20  Temp: 98.5 F (36.9 C)   (!) 97.5 F (36.4 C)  TempSrc:    Oral  SpO2: 92%  93% 96%  Weight:  78.7 kg    Height:        Intake/Output Summary (Last 24 hours) at 04/22/2020 1759 Last data filed at 04/22/2020 1500 Gross per 24 hour  Intake 720 ml  Output --  Net 720 ml   Filed Weights   04/17/20 0600 04/21/20 0500 04/22/20 0500  Weight: 87.2 kg 95.6 kg 78.7 kg    Examination:  General exam: Alert and comfortable, not in any kind of distress Respiratory system: Clear to auscultation bilaterally, no wheezing or rhonchi Cardiovascular system: S1-S2 heard, regular rate rhythm, no JVD, no pedal edema Gastrointestinal system: Abdomen is soft, nontender bowel sounds normal Central nervous system: Alert and confused Extremities: No pedal edema Psychiatry: Alert, mood is appropriate with    Data Reviewed: I have personally reviewed following labs and imaging studies  CBC: Recent Labs  Lab 04/19/20 1315 04/22/20 1613  WBC 9.8 9.1  NEUTROABS  --  6.6  HGB 13.6 11.8*  HCT 43.8 38.4  MCV 76.7* 77.7*  PLT 442* 458*    Basic Metabolic Panel: Recent Labs  Lab 04/17/20 0557 04/18/20 1148 04/19/20 1315 04/20/20 0708 04/22/20 1613  NA 137 137 138 138 139  K 3.1* 3.8 4.5 3.5 2.8*  CL 96* 102 94* 90* 91*  CO2 28 22 32 31 35*  GLUCOSE 175* 143* 158* 176* 109*  BUN 21* 17 21* 28* 31*  CREATININE 1.31* 0.91 1.36* 1.46* 1.43*  CALCIUM 8.1* 7.9* 8.3* 7.9* 8.0*  MG  --   --  1.3* 1.4* 1.9    GFR: Estimated Creatinine Clearance: 45.4 mL/min (A) (by C-G formula based on SCr of 1.43 mg/dL (H)).  Liver Function Tests: Recent Labs  Lab 04/22/20 1613  AST 25  ALT 15  ALKPHOS 157*  BILITOT 0.8  PROT 6.6  ALBUMIN 2.2*    CBG: Recent Labs  Lab 04/21/20 2201 04/22/20 0738 04/22/20 0823 04/22/20 1128 04/22/20 1649  GLUCAP 168* 67* 72 82 108*     Recent Results (from the past 240 hour(s))  Resp Panel by RT-PCR (Flu A&B, Covid)  Nasopharyngeal Swab     Status: None   Collection Time: 04/15/20 10:20 AM   Specimen: Nasopharyngeal Swab; Nasopharyngeal(NP) swabs in vial transport medium  Result Value Ref Range Status   SARS Coronavirus 2 by RT PCR NEGATIVE NEGATIVE Final    Comment: (NOTE) SARS-CoV-2 target nucleic acids are NOT DETECTED.  The SARS-CoV-2 RNA is generally detectable in upper respiratory specimens during the acute phase of infection. The lowest concentration of SARS-CoV-2 viral copies this assay can detect is 138 copies/mL. A negative result does not preclude SARS-Cov-2 infection and should not be used as the sole basis for treatment or other patient management decisions. A negative result may occur with  improper specimen collection/handling, submission of specimen other than nasopharyngeal swab, presence of viral mutation(s)  within the areas targeted by this assay, and inadequate number of viral copies(<138 copies/mL). A negative result must be combined with clinical observations, patient history, and epidemiological information. The expected result is Negative.  Fact Sheet for Patients:  EntrepreneurPulse.com.au  Fact Sheet for Healthcare Providers:  IncredibleEmployment.be  This test is no t yet approved or cleared by the [place] FDA and  has been authorized for detection and/or diagnosis of SARS-CoV-2 by FDA under an Emergency Use Authorization (EUA). This EUA will remain  in effect (meaning this test can be used) for the duration of the COVID-19 declaration under Section 564(b)(1) of the Act, 21 U.S.C.section 360bbb-3(b)(1), unless the authorization is terminated  or revoked sooner.       Influenza A by PCR NEGATIVE NEGATIVE Final   Influenza B by PCR NEGATIVE NEGATIVE Final    Comment: (NOTE) The Xpert Xpress SARS-CoV-2/FLU/RSV plus assay is intended as an aid in the diagnosis of influenza from Nasopharyngeal swab specimens and should not be  used as a sole basis for treatment. Nasal washings and aspirates are unacceptable for Xpert Xpress SARS-CoV-2/FLU/RSV testing.  Fact Sheet for Patients: EntrepreneurPulse.com.au  Fact Sheet for Healthcare Providers: IncredibleEmployment.be  This test is not yet approved or cleared by the [place] FDA and has been authorized for detection and/or diagnosis of SARS-CoV-2 by FDA under an Emergency Use Authorization (EUA). This EUA will remain in effect (meaning this test can be used) for the duration of the COVID-19 declaration under Section 564(b)(1) of the Act, 21 U.S.C. section 360bbb-3(b)(1), unless the authorization is terminated or revoked.  Performed at [facility], [place] [address] [place], [place] [address]          Radiology Studies: DG Abd 1 View  Result Date: 04/22/2020 CLINICAL DATA:  Follow-up ileus EXAM: ABDOMEN - 1 VIEW COMPARISON:  04/21/20 FINDINGS: Scattered large and small bowel gas is noted. The small-bowel dilatation in the left mid abdomen is stable. Some increasing colonic gas is noted. No free air is noted. IMPRESSION: Stable small bowel dilatation. Electronically Signed   By: Inez Catalina M.D.   On: 04/22/2020 09:43   DG Abd 2 Views  Result Date: 04/21/2020 CLINICAL DATA:  Evaluate for ileus EXAM: ABDOMEN - 2 VIEW COMPARISON:  04/26/2020 FINDINGS: Interval removal of enteric tube. Persistent dilated loop of small bowel within the left lower quadrant of the abdomen measures up to 4.3 cm. Decreased small bowel dilatation in the right lower quadrant. Increase colonic gas noted in the right hemiabdomen. IMPRESSION: Persistent but improved small bowel ileus pattern. Electronically Signed   By: Kerby Moors M.D.   On: 04/21/2020 13:45        Scheduled Meds: . (feeding supplement) PROSource Plus  30 mL Oral BID BM  . acetaminophen  650 mg Oral Q6H  . amitriptyline  100 mg Oral QHS  . busPIRone   10 mg Oral TID  . carbamazepine  400 mg Oral BID  . chlorhexidine  15 mL Mouth Rinse BID  . collagenase   Topical Daily  . dicyclomine  10 mg Oral TID AC & HS  . feeding supplement (GLUCERNA SHAKE)  237 mL Oral BID BM  . insulin aspart  0-6 Units Subcutaneous TID WC  . insulin glargine  5 Units Subcutaneous QHS  . lipase/protease/amylase  72,000 Units Oral TID WC  . mouth rinse  15 mL Mouth Rinse q12n4p  . metoprolol succinate  50 mg Oral Daily  . mometasone-formoterol  2 puff Inhalation  BID  . multivitamin with minerals  1 tablet Oral Daily  . nicotine  14 mg Transdermal Daily  . OLANZapine  5 mg Oral QHS  . sodium chloride flush  10-40 mL Intracatheter Q12H   Continuous Infusions:   LOS: 32 days        Hosie Poisson, MD [facility]   To contact the attending provider between 7A-7P or the covering provider during after hours 7P-7A, please log into the web site [email or web address] and access using universal [place] password for that web site. If you do not have the password, please call the hospital operator.  04/22/2020, 5:59 PM

## 2020-04-22 NOTE — Progress Notes (Signed)
OT Cancellation Note  Patient Details Name: Lashante Fryberger MRN: [id number] DOB: [birth year]   Cancelled Treatment:    Reason Eval/Treat Not Completed: Patient declined, no reason specified. Patient declines therapy at this time. Reports she just got back to bed. Patient drowsy and falling asleep. Will f/u as able.   Atharv Barriere L Arrie Zuercher 04/22/2020, 10:14 AM

## 2020-04-22 NOTE — Progress Notes (Signed)
Recheck Blood Sugar 72. MD making rounds and made aware. Maintain current plan of care for the Pt.

## 2020-04-22 NOTE — Plan of Care (Signed)
  Problem: Clinical Measurements: Goal: Ability to maintain clinical measurements within normal limits will improve Outcome: Progressing Goal: Will remain free from infection Outcome: Progressing Goal: Diagnostic test results will improve Outcome: Progressing Goal: Respiratory complications will improve Outcome: Progressing Goal: Cardiovascular complication will be avoided Outcome: Progressing   Problem: Elimination: Goal: Will not experience complications related to urinary retention Outcome: Progressing   Problem: Pain Managment: Goal: General experience of comfort will improve Outcome: Progressing   Problem: Safety: Goal: Ability to remain free from injury will improve Outcome: Progressing   Problem: Skin Integrity: Goal: Risk for impaired skin integrity will decrease Outcome: Progressing   

## 2020-04-22 NOTE — Progress Notes (Signed)
Pt's morning blood sugar 67. Pt sitting of side off bed and no acute distress noted. MD paged to update regarding blood sugar and that Pt is NPO and has no IV access. Orange juice given to Pt and Pt drank half a cup before refusing to drink anymore

## 2020-04-22 NOTE — Progress Notes (Signed)
Inpatient Diabetes Program Recommendations  AACE/ADA: New Consensus Statement on Inpatient Glycemic Control (2015)  Target Ranges:  Prepandial:   less than 140 mg/dL      Peak postprandial:   less than 180 mg/dL (1-2 hours)      Critically ill patients:  140 - 180 mg/dL   Lab Results  Component Value Date   GLUCAP 82 04/22/2020   HGBA1C 15.1 (H) 02/23/2020    Review of Glycemic Control Results for Morgan Beasley, Morgan Beasley (MRN [id number]) as of 04/22/2020 11:30  Ref. Range 04/21/2020 17:19 04/21/2020 22:01 04/22/2020 07:38 04/22/2020 08:23 04/22/2020 11:28  Glucose-Capillary Latest Ref Range: 70 - 99 mg/dL 153 (H) 168 (H) 67 (L) 72 82   Diabetes history: DM 2  Current orders for Inpatient glycemic control:  Lantus 10 units q HS, Novolog 0-6 units tid with meals and HS  Inpatient Diabetes Program Recommendations:   Consider slight reduction of Lantus to 8 units q HS.   Thanks  Adah Perl, RN, BC-ADM Inpatient Diabetes Coordinator Pager [phone] (8a-5p)

## 2020-04-23 ENCOUNTER — Inpatient Hospital Stay (HOSPITAL_COMMUNITY): Payer: Medicare Other

## 2020-04-23 LAB — GLUCOSE, CAPILLARY
Glucose-Capillary: 97 mg/dL (ref 70–99)
Glucose-Capillary: 91 mg/dL (ref 70–99)
Glucose-Capillary: 111 mg/dL — ABNORMAL HIGH (ref 70–99)
Glucose-Capillary: 113 mg/dL — ABNORMAL HIGH (ref 70–99)

## 2020-04-23 LAB — BASIC METABOLIC PANEL
Anion gap: 10 (ref 5–15)
BUN: 31 mg/dL — ABNORMAL HIGH (ref 6–20)
CO2: 34 mmol/L — ABNORMAL HIGH (ref 22–32)
Calcium: 7.8 mg/dL — ABNORMAL LOW (ref 8.9–10.3)
Chloride: 93 mmol/L — ABNORMAL LOW (ref 98–111)
Creatinine, Ser: 1.26 mg/dL — ABNORMAL HIGH (ref 0.44–1.00)
GFR, Estimated: 49 mL/min — ABNORMAL LOW (ref >60)
Glucose, Bld: 100 mg/dL — ABNORMAL HIGH (ref 70–99)
Potassium: 2.8 mmol/L — ABNORMAL LOW (ref 3.5–5.1)
Sodium: 137 mmol/L (ref 135–145)

## 2020-04-23 MED ORDER — POTASSIUM CHLORIDE 10 MEQ/100ML IV SOLN
10.0000 meq | INTRAVENOUS | Status: AC
  Administered 2020-04-23 (×2): 10 meq via INTRAVENOUS
  Filled 2020-04-23 (×2): qty 100

## 2020-04-23 MED ORDER — POTASSIUM CHLORIDE CRYS ER 20 MEQ PO TBCR
40.0000 meq | EXTENDED_RELEASE_TABLET | Freq: Two times a day (BID) | ORAL | Status: AC
  Filled 2020-04-23: qty 2

## 2020-04-23 NOTE — Progress Notes (Signed)
PROGRESS NOTE    Morgan Beasley  FYB:[id number] DOB: [birth year] DOA: 03/21/2020 PCP: Riki Sheer, NP    Chief Complaint  Patient presents with  . Weakness  . Shortness of Breath    Brief Narrative:  59 year old lady with prior history of depression, bipolar disorder, substance abuse, pulmonary valve endocarditis and multiple CVAs presents to ED for agitation hallucinations and restlessness requiring Precedex drip in the ICU briefly psychiatric consulted and she was started on antipsychotics with slight improvement she was initially scheduled to be discharged to [facility] psychiatry unit but currently plan is for discharge home patient on April 18 2020 developed some abdominal distention associated with some nausea and vomiting.  CT abdomen and pelvis on 04/19/2020 showed small bowel distention possibly obstruction versus ileus.  An NG tube was placed but was pulled out by the patient last night.  Overnight as per the RN patient had several bouts of vomiting with watery diarrhea. Repeat abdominal film showed improvement in the ileus but not resolved.  Overnight pt was agitated and had to be on wrist restraints.    Assessment & Plan:   Principal Problem:   Acute metabolic encephalopathy Active Problems:   Peripheral neuropathy   COPD (chronic obstructive pulmonary disease) (HCC)   Diabetic peripheral neuropathy associated with type 2 diabetes mellitus (HCC)   Cerebral embolism with cerebral infarction   Mixed hyperlipidemia   Acute metabolic encephalopathy probably secondary to psychosis in the setting of bipolar disorder. Improving but not back to baseline. Pt currently on  Elavil 100 mg daily at bedtime,, buspirone 10 mg 3 times daily, carbamazepine 400 mg twice daily, olanzapine 5 mg daily at bedtime. No changes in meds.  Overnight pt became agitated, required wrist restraints to prevent her from pulling the midline.    COPD No wheezing heard.  continue with  bronchodilators as needed.    Peripheral neuropathy secondary to type 2 diabetes Continue home with home medications. Recommend outpatient follow-up with vascular surgery.    Type 2 diabetes mellitus CBG (last 3)  Recent Labs    04/22/20 1649 04/23/20 0821 04/23/20 1109  GLUCAP 108* 97 91   Continue with SSI.  D/c long acting insulin, until her ileus resolves and she is back on carb modified diet.  No changes to meds.    Acute on chronic systolic heart failure Diuresed appropriately with Lasix.  Patient on Lasix(which is being held for),as pt is taking very little by mouth.  She appears euvolemic.    H/o CVA Imaging of the brain showed right and left occipital infarcts in addition to left basal ganglia and thalamic microhemorrhages Restart plavix and lipitor.    History of chronic pancreatitis Continue with Creon    Acute kidney injury on stage IIIa CKD Creatinine at baseline.      Nausea vomiting and abdominal distention. CT of the abdomen pelvis showed distended small bowel consistent with obstruction versus ileus. NG tube was placed but it was pulled out by the patient .  Multiple BM today, decrease the dose of dulcolax.  Start her on clears and advance as tolerated.     Hypokalemia and hypomagnesemia:  Replaced.    Right supraclavicular/mediastinal adenopathy Recommend outpatient follow-up with oncology.     DVT prophylaxis: SCDs Code Status: Full code Family Communication: None at bedside, discussed with daughter over the phone.  Disposition:   Status is: Inpatient  Remains inpatient appropriate because:Ongoing diagnostic testing needed not appropriate for outpatient work up and Unsafe d/c plan  Dispo: The patient is from: Home              Anticipated d/c is to: SNF              Anticipated d/c date is: 2 days              Patient currently is not medically stable to d/c.       Consultants:   None  Procedures: Abdominal  x-ray pending Antimicrobials: None  Subjective: No new complaints, wants to eat noodles . Does not want to eat liquid diet.   Objective: Vitals:   04/22/20 2253 04/23/20 0500 04/23/20 0551 04/23/20 1207  BP: 136/84  135/84 (!) 151/82  Pulse: 100  96 (!) 105  Resp: 20  18 18   Temp: 98.9 F (37.2 C)  97.6 F (36.4 C) 97.8 F (36.6 C)  TempSrc: Oral     SpO2: 98%  91%   Weight:  78.8 kg    Height:        Intake/Output Summary (Last 24 hours) at 04/23/2020 1445 Last data filed at 04/22/2020 1818 Gross per 24 hour  Intake 480 ml  Output 0 ml  Net 480 ml   Filed Weights   04/21/20 0500 04/22/20 0500 04/23/20 0500  Weight: 95.6 kg 78.7 kg 78.8 kg    Examination:  General exam: Alert and comfortable, not in any distress Respiratory system: Clear to auscultation bilaterally, no wheezing or rhonchi Cardiovascular system: S1-S2 heard, tachycardic, no JVD Gastrointestinal system: Abdomen is soft, nontender, nondistended bowel sounds normal Central nervous system: Alert, slightly confused but able to answer all questions appropriately Extremities: No pedal edema Psychiatry: Mood is appropriate    Data Reviewed: I have personally reviewed following labs and imaging studies  CBC: Recent Labs  Lab 04/19/20 1315 04/22/20 1613  WBC 9.8 9.1  NEUTROABS  --  6.6  HGB 13.6 11.8*  HCT 43.8 38.4  MCV 76.7* 77.7*  PLT 442* 458*    Basic Metabolic Panel: Recent Labs  Lab 04/18/20 1148 04/19/20 1315 04/20/20 0708 04/22/20 1613 04/23/20 0857  NA 137 138 138 139 137  K 3.8 4.5 3.5 2.8* 2.8*  CL 102 94* 90* 91* 93*  CO2 22 32 31 35* 34*  GLUCOSE 143* 158* 176* 109* 100*  BUN 17 21* 28* 31* 31*  CREATININE 0.91 1.36* 1.46* 1.43* 1.26*  CALCIUM 7.9* 8.3* 7.9* 8.0* 7.8*  MG  --  1.3* 1.4* 1.9  --     GFR: Estimated Creatinine Clearance: 51.6 mL/min (A) (by C-G formula based on SCr of 1.26 mg/dL (H)).  Liver Function Tests: Recent Labs  Lab 04/22/20 1613  AST 25   ALT 15  ALKPHOS 157*  BILITOT 0.8  PROT 6.6  ALBUMIN 2.2*    CBG: Recent Labs  Lab 04/22/20 0823 04/22/20 1128 04/22/20 1649 04/23/20 0821 04/23/20 1109  GLUCAP 72 82 108* 97 91     Recent Results (from the past 240 hour(s))  Resp Panel by RT-PCR (Flu A&B, Covid) Nasopharyngeal Swab     Status: None   Collection Time: 04/15/20 10:20 AM   Specimen: Nasopharyngeal Swab; Nasopharyngeal(NP) swabs in vial transport medium  Result Value Ref Range Status   SARS Coronavirus 2 by RT PCR NEGATIVE NEGATIVE Final    Comment: (NOTE) SARS-CoV-2 target nucleic acids are NOT DETECTED.  The SARS-CoV-2 RNA is generally detectable in upper respiratory specimens during the acute phase of infection. The lowest concentration of SARS-CoV-2 viral copies this assay can  detect is 138 copies/mL. A negative result does not preclude SARS-Cov-2 infection and should not be used as the sole basis for treatment or other patient management decisions. A negative result may occur with  improper specimen collection/handling, submission of specimen other than nasopharyngeal swab, presence of viral mutation(s) within the areas targeted by this assay, and inadequate number of viral copies(<138 copies/mL). A negative result must be combined with clinical observations, patient history, and epidemiological information. The expected result is Negative.  Fact Sheet for Patients:  EntrepreneurPulse.com.au  Fact Sheet for Healthcare Providers:  IncredibleEmployment.be  This test is no t yet approved or cleared by the [place] FDA and  has been authorized for detection and/or diagnosis of SARS-CoV-2 by FDA under an Emergency Use Authorization (EUA). This EUA will remain  in effect (meaning this test can be used) for the duration of the COVID-19 declaration under Section 564(b)(1) of the Act, 21 U.S.C.section 360bbb-3(b)(1), unless the authorization is terminated  or  revoked sooner.       Influenza A by PCR NEGATIVE NEGATIVE Final   Influenza B by PCR NEGATIVE NEGATIVE Final    Comment: (NOTE) The Xpert Xpress SARS-CoV-2/FLU/RSV plus assay is intended as an aid in the diagnosis of influenza from Nasopharyngeal swab specimens and should not be used as a sole basis for treatment. Nasal washings and aspirates are unacceptable for Xpert Xpress SARS-CoV-2/FLU/RSV testing.  Fact Sheet for Patients: EntrepreneurPulse.com.au  Fact Sheet for Healthcare Providers: IncredibleEmployment.be  This test is not yet approved or cleared by the [place] FDA and has been authorized for detection and/or diagnosis of SARS-CoV-2 by FDA under an Emergency Use Authorization (EUA). This EUA will remain in effect (meaning this test can be used) for the duration of the COVID-19 declaration under Section 564(b)(1) of the Act, 21 U.S.C. section 360bbb-3(b)(1), unless the authorization is terminated or revoked.  Performed at [facility], [place] [address], [place], [place] [address]          Radiology Studies: DG Abd 1 View  Result Date: 04/23/2020 CLINICAL DATA:  Follow-up ileus EXAM: ABDOMEN - 1 VIEW COMPARISON:  04/22/20 FINDINGS: Persistent small bowel dilatation is noted with paucity of colonic gas. The degree of dilated small bowel has increased in the interval from the prior exam consistent with a progressive process no bony abnormality is seen. No free air is noted. IMPRESSION: Increase in the number of dilated small bowel loops when compared with the prior exam. No free air is noted. Electronically Signed   By: Inez Catalina M.D.   On: 04/23/2020 09:36   DG Abd 1 View  Result Date: 04/22/2020 CLINICAL DATA:  Follow-up ileus EXAM: ABDOMEN - 1 VIEW COMPARISON:  04/21/20 FINDINGS: Scattered large and small bowel gas is noted. The small-bowel dilatation in the left mid abdomen is stable. Some increasing  colonic gas is noted. No free air is noted. IMPRESSION: Stable small bowel dilatation. Electronically Signed   By: Inez Catalina M.D.   On: 04/22/2020 09:43        Scheduled Meds: . (feeding supplement) PROSource Plus  30 mL Oral BID BM  . acetaminophen  650 mg Oral Q6H  . amitriptyline  100 mg Oral QHS  . atorvastatin  20 mg Oral Daily  . busPIRone  10 mg Oral TID  . carbamazepine  400 mg Oral BID  . chlorhexidine  15 mL Mouth Rinse BID  . clopidogrel  75 mg Oral Daily  . collagenase   Topical Daily  .  dicyclomine  10 mg Oral TID AC & HS  . feeding supplement (GLUCERNA SHAKE)  237 mL Oral BID BM  . insulin aspart  0-6 Units Subcutaneous TID WC  . insulin glargine  5 Units Subcutaneous QHS  . lipase/protease/amylase  72,000 Units Oral TID WC  . mouth rinse  15 mL Mouth Rinse q12n4p  . metoprolol succinate  50 mg Oral Daily  . mometasone-formoterol  2 puff Inhalation BID  . multivitamin with minerals  1 tablet Oral Daily  . nicotine  14 mg Transdermal Daily  . OLANZapine  5 mg Oral QHS  . potassium chloride  40 mEq Oral BID  . potassium chloride  40 mEq Oral BID  . sodium chloride flush  10-40 mL Intracatheter Q12H   Continuous Infusions:   LOS: 33 days        Hosie Poisson, MD [facility]   To contact the attending provider between 7A-7P or the covering provider during after hours 7P-7A, please log into the web site [email or web address] and access using universal  password for that web site. If you do not have the password, please call the hospital operator.  04/23/2020, 2:45 PM

## 2020-04-23 NOTE — Progress Notes (Signed)
Md notified: Pt vomited 10 minutes after night medications (pt confused/non compliant with meds), pt has ileus but pulled Ng tube last night and refuses it tonight even after vomiting . approx 300 mL clear lime green vomit. Creon is PRN in daytime-may need to be scheduled to ensure it isnt missed. She has a diet order (NPO atm). Held glargine-bs 113, npo, vomitting. Abd. extended, hypoactive bs. Scan today showed improving ileus per MD note

## 2020-04-23 NOTE — Plan of Care (Signed)
  Problem: Safety: Goal: Ability to remain free from injury will improve Outcome: Not Progressing   

## 2020-04-23 NOTE — TOC Progression Note (Signed)
Transition of Care [facility]) - Progression Note    Patient Details  Name: Morgan Beasley MRN: [id number] Date of Birth: January 15, 1961  Transition [facility]) CM/SW Contact  Lavontae Cornia, [name], RN Phone Number: 04/23/2020, 4:40 PM  Clinical Narrative:[place] [facility] rep [facility] accepted-will start auth on Monday(insurance closed) [name] dtr in agreement to SNF if auth. [facility] still following if  D/c plan is home-dtr [name] agrees.       Expected Discharge Plan: Skilled Nursing Facility Barriers to Discharge: Insurance Authorization  Expected Discharge Plan and Services Expected Discharge Plan: [place]   Discharge Planning Services: CM Consult Post Acute Care Choice: [place] Living arrangements for the past 2 months: [place] Expected Discharge Date: 04/15/20                         HH Arranged: PT,OT [place]: [place] Date HH Agency Contacted: 04/20/20 Time [place]: [id number] Representative spoke with at [place]: [place] ([place]) Interventions    Readmission Risk Interventions Readmission Risk Prevention Plan 03/18/2020 10/21/2019  Transportation Screening Complete Complete  Medication Review Press photographer) - Complete  PCP or Specialist appointment within 3-5 days of discharge Not Complete Complete  PCP/Specialist Appt Not Complete comments First available appt 12/1 -  [place] or [place] Complete Complete  SW Recovery Care/Counseling Consult Complete Complete  Palliative Care Screening Not Applicable Not [place] Not Applicable Not Applicable  Some recent data might be hidden

## 2020-04-24 LAB — BASIC METABOLIC PANEL
Anion gap: 13 (ref 5–15)
BUN: 25 mg/dL — ABNORMAL HIGH (ref 6–20)
CO2: 32 mmol/L (ref 22–32)
Calcium: 7.8 mg/dL — ABNORMAL LOW (ref 8.9–10.3)
Chloride: 94 mmol/L — ABNORMAL LOW (ref 98–111)
Creatinine, Ser: 1.13 mg/dL — ABNORMAL HIGH (ref 0.44–1.00)
GFR, Estimated: 56 mL/min — ABNORMAL LOW (ref >60)
Glucose, Bld: 121 mg/dL — ABNORMAL HIGH (ref 70–99)
Potassium: 3.2 mmol/L — ABNORMAL LOW (ref 3.5–5.1)
Sodium: 139 mmol/L (ref 135–145)

## 2020-04-24 LAB — CBC
HCT: 39.7 % (ref 36.0–46.0)
Hemoglobin: 12.1 g/dL (ref 12.0–15.0)
MCH: 23.1 pg — ABNORMAL LOW (ref 26.0–34.0)
MCHC: 30.5 g/dL (ref 30.0–36.0)
MCV: 75.8 fL — ABNORMAL LOW (ref 80.0–100.0)
Platelets: 420 10*3/uL — ABNORMAL HIGH (ref 150–400)
RBC: 5.24 MIL/uL — ABNORMAL HIGH (ref 3.87–5.11)
RDW: 22.4 % — ABNORMAL HIGH (ref 11.5–15.5)
WBC: 8.2 10*3/uL (ref 4.0–10.5)
nRBC: 0 % (ref 0.0–0.2)

## 2020-04-24 LAB — GLUCOSE, CAPILLARY
Glucose-Capillary: 97 mg/dL (ref 70–99)
Glucose-Capillary: 101 mg/dL — ABNORMAL HIGH (ref 70–99)
Glucose-Capillary: 106 mg/dL — ABNORMAL HIGH (ref 70–99)
Glucose-Capillary: 112 mg/dL — ABNORMAL HIGH (ref 70–99)

## 2020-04-24 LAB — MAGNESIUM: Magnesium: 1.9 mg/dL (ref 1.7–2.4)

## 2020-04-24 LAB — PHOSPHORUS: Phosphorus: 3.3 mg/dL (ref 2.5–4.6)

## 2020-04-24 MED ORDER — MORPHINE SULFATE (PF) 2 MG/ML IV SOLN
1.0000 mg | Freq: Once | INTRAVENOUS | Status: AC
  Administered 2020-04-24: 1 mg via INTRAVENOUS
  Filled 2020-04-24: qty 1

## 2020-04-24 MED ORDER — SODIUM CHLORIDE 0.9 % IV SOLN
INTRAVENOUS | Status: DC | PRN
  Administered 2020-04-24: 250 mL via INTRAVENOUS

## 2020-04-24 MED ORDER — POTASSIUM CHLORIDE 10 MEQ/100ML IV SOLN
10.0000 meq | INTRAVENOUS | Status: AC
  Administered 2020-04-24 (×2): 10 meq via INTRAVENOUS
  Filled 2020-04-24 (×2): qty 100

## 2020-04-24 MED ORDER — OLANZAPINE 5 MG PO TABS
7.5000 mg | ORAL_TABLET | Freq: Every day | ORAL | Status: DC
  Administered 2020-04-24 – 2020-04-27 (×4): 7.5 mg via ORAL
  Filled 2020-04-24 (×4): qty 2

## 2020-04-24 MED ORDER — MAGNESIUM OXIDE 400 (241.3 MG) MG PO TABS
400.0000 mg | ORAL_TABLET | Freq: Every day | ORAL | Status: AC
  Administered 2020-04-26: 400 mg via ORAL
  Filled 2020-04-24 (×2): qty 1

## 2020-04-24 MED ORDER — HALOPERIDOL LACTATE 5 MG/ML IJ SOLN
2.0000 mg | Freq: Four times a day (QID) | INTRAMUSCULAR | Status: DC | PRN
  Administered 2020-04-25 – 2020-04-28 (×2): 2 mg via INTRAVENOUS
  Filled 2020-04-24 (×2): qty 1

## 2020-04-24 MED ORDER — POTASSIUM CHLORIDE CRYS ER 20 MEQ PO TBCR
40.0000 meq | EXTENDED_RELEASE_TABLET | Freq: Once | ORAL | Status: DC
  Filled 2020-04-24: qty 2

## 2020-04-24 NOTE — Plan of Care (Signed)
  Problem: Clinical Measurements: Goal: Respiratory complications will improve Outcome: Progressing Goal: Cardiovascular complication will be avoided Outcome: Progressing   Problem: Clinical Measurements: Goal: Ability to maintain clinical measurements within normal limits will improve Outcome: Not Progressing   Problem: Nutrition: Goal: Adequate nutrition will be maintained Outcome: Not Progressing

## 2020-04-24 NOTE — Consult Note (Signed)
CC: unable to obtain  Requesting provider: Dr Karleen Hampshire  HPI: Morgan Beasley is an 59 y.o. female who was admitted over 34 days ago with confusion/delirium and metabolic encephalopathy who we were consulted on today for ileus versus small bowel obstruction.  The patient has had a recent hospitalization in October for an acute CVA and bacteremia.  She is on Plavix.  She was initially supposed to go to skilled nursing facility about a week ago when she developed abdominal distention and intermittent nausea and vomiting.  A CT scan was performed on December 20 which was noncontrasted it showed dilated small bowel with distal decompression of her small bowel without any evidence of transition.  The patient has had some ongoing issues with her bipolar disease and has intermittently pulled her NG tube out.  Because of ongoing vomiting we were asked to see her in consultation.  I am not really able to obtain any history from the patient.  The patient had several episodes of emesis yesterday but had a bowel movement yesterday and another one this morning.  She denies any abdominal pain from what she will answer.  It appears that she has had at least a laparoscopic procedure as there is a small infraumbilical incision  Her comorbidities include recent CVA, COPD, diabetes mellitus type 2, diastolic heart failure, bipolar disease, chronic kidney disease, PAD and atherosclerosis, OSA  Her plain films yesterday showed small bowel distention However she is also severely hypokalemic on blood work yesterday Reportedly the patient refused blood work this morning and there is no IV fluids running  Past Medical History:  Diagnosis Date  . Alcohol dependence ([place])   . Anemia   . Anxiety   . Breast cancer ([place])   . Chronic combined systolic and diastolic CHF (congestive heart failure) ([place])   . Cigarette nicotine dependence   . CKD (chronic kidney disease), stage III ([place])   . Colon polyps   . COPD  (chronic obstructive pulmonary disease) ([place])   . Diabetes mellitus without complication ([place])   . Diabetic neuropathy ([place])   . Gout   . Hyperlipemia   . Hypertension   . Insomnia   . Lymphedema   . Marijuana use   . Mild CAD 2016   a. NSTEMI 2016 in context of cocaine abuse, 50% RCA% at that time.  . OSA treated with BiPAP   . PAD (peripheral artery disease) ([place])    a. s/p L SFA stenting 08/2019. b. left fem-to-below-knee-popliteal bypass 09/2019.  Marland Kitchen Pancreatitis    acute on chronic due to ETOH initially.    . Sleep apnea    wears BIPAP  . Ulcer of foot ([place])    right    Past Surgical History:  Procedure Laterality Date  . ABDOMINAL AORTOGRAM W/LOWER EXTREMITY N/A 09/26/2019   Procedure: ABDOMINAL AORTOGRAM W/LOWER EXTREMITY;  Surgeon: Angelia Mould, MD;  Location: [facility];  Service: Cardiovascular;  Laterality: N/A;  . ABDOMINAL AORTOGRAM W/LOWER EXTREMITY Bilateral 12/08/2019   Procedure: ABDOMINAL AORTOGRAM W/LOWER EXTREMITY;  Surgeon: Waynetta Sandy, MD;  Location: [facility];  Service: Cardiovascular;  Laterality: Bilateral;  . ABDOMINAL AORTOGRAM W/LOWER EXTREMITY Left 01/26/2020   Procedure: ABDOMINAL AORTOGRAM W/LOWER EXTREMITY;  Surgeon: Waynetta Sandy, MD;  Location: [facility];  Service: Cardiovascular;  Laterality: Left;  . ABDOMINAL HYSTERECTOMY    . BUBBLE STUDY  03/04/2020   Procedure: BUBBLE STUDY;  Surgeon: Elouise Munroe, MD;  Location: [facility];  Service: Cardiology;;  . [facility]  . FEMORAL-POPLITEAL BYPASS GRAFT Left 10/14/2019   Procedure: BYPASS GRAFT LEFT FEMORAL-POPLITEAL ARTERY USING NONREVERSED SAPHENOUS VEIN;  Surgeon: Waynetta Sandy, MD;  Location: [facility];  Service: Vascular;  Laterality: Left;  Marland Kitchen MASTECTOMY Right April 2016  . PERIPHERAL VASCULAR ATHERECTOMY Left 01/26/2020   Procedure: PERIPHERAL VASCULAR ATHERECTOMY;  Surgeon: Waynetta Sandy,  MD;  Location: [facility];  Service: Cardiovascular;  Laterality: Left;  PT and AT - [facility]  . PERIPHERAL VASCULAR BALLOON ANGIOPLASTY Left 01/26/2020   Procedure: PERIPHERAL VASCULAR BALLOON ANGIOPLASTY;  Surgeon: Waynetta Sandy, MD;  Location: [facility];  Service: Cardiovascular;  Laterality: Left;  [place]   . PERIPHERAL VASCULAR INTERVENTION Left 09/26/2019   Procedure: PERIPHERAL VASCULAR INTERVENTION;  Surgeon: Angelia Mould, MD;  Location: [facility];  Service: Cardiovascular;  Laterality: Left;  superficial femoral  . TEE WITHOUT CARDIOVERSION N/A 03/04/2020   Procedure: TRANSESOPHAGEAL ECHOCARDIOGRAM (TEE);  Surgeon: Elouise Munroe, MD;  Location: [facility];  Service: Cardiology;  Laterality: N/A;    Family History  Problem Relation Age of Onset  . Diabetes Other   . Heart disease Other   . Breast cancer Maternal Grandmother   . Breast cancer Paternal Grandmother   . Stroke Son   . Colon cancer Neg Hx   . Esophageal cancer Neg Hx   . Rectal cancer Neg Hx   . Stomach cancer Neg Hx     Social:  reports that she has been smoking cigarettes. She has never used smokeless tobacco. She reports previous alcohol use. She reports previous drug use. Drug: Marijuana.  Allergies:  Allergies  Allergen Reactions  . Tramadol Swelling  . Nsaids Other (See Comments)    Pancreatitis  . Tolmetin Other (See Comments)    Pancreatitis  . Tylenol [Acetaminophen] Other (See Comments)    unknown  . Aspirin Other (See Comments)    "Makes my pancreas act up"     Medications: I have reviewed the patient's current medications.   ROS - unable to obtain  PE Blood pressure 94/62, pulse (!) 107, temperature 98.1 F (36.7 C), resp. rate 18, height 5\' 6"  (1.676 m), weight 77.8 kg, SpO2 94 %. Constitutional: NAD; soft restraints; no deformities Eyes: Moist conjunctiva; no lid lag; anicteric; PERRL Neck: Trachea midline; no thyromegaly Lungs:  Normal respiratory effort; no tactile fremitus CV: RRR; no palpable thrills; no pitting edema GI: Abd soft, obese, some distension, nontender. No guarding; no palpable hepatosplenomegaly MSK:  no clubbing/cyanosis Psychiatric: affect - unable to adequately assess; asleep, responds to questions,  Oriented to person, city, month and year but not date Lymphatic: No palpable cervical or axillary lymphadenopathy Skin: no rash, no peripheral edema; multiple old healed sores on b/l LE, wound on foot. Peeling superficial skin on foot  Results for orders placed or performed during the hospital encounter of 03/21/20 (from the past 48 hour(s))  Glucose, capillary     Status: None   Collection Time: 04/22/20 11:28 AM  Result Value Ref Range   Glucose-Capillary 82 70 - 99 mg/dL    Comment: Glucose reference range applies only to samples taken after fasting for at least 8 hours.  CBC with Differential/Platelet     Status: Abnormal   Collection Time: 04/22/20  4:13 PM  Result Value Ref Range   WBC 9.1 4.0 - 10.5 K/uL   RBC 4.94 3.87 - 5.11 MIL/uL   Hemoglobin  11.8 (L) 12.0 - 15.0 g/dL   HCT 38.4 36.0 - 46.0 %   MCV 77.7 (L) 80.0 - 100.0 fL   MCH 23.9 (L) 26.0 - 34.0 pg   MCHC 30.7 30.0 - 36.0 g/dL   RDW 22.0 (H) 11.5 - 15.5 %   Platelets 458 (H) 150 - 400 K/uL   nRBC 0.0 0.0 - 0.2 %   Neutrophils Relative % 73 %   Neutro Abs 6.6 1.7 - 7.7 K/uL   Lymphocytes Relative 12 %   Lymphs Abs 1.1 0.7 - 4.0 K/uL   Monocytes Relative 12 %   Monocytes Absolute 1.1 (H) 0.1 - 1.0 K/uL   Eosinophils Relative 2 %   Eosinophils Absolute 0.2 0.0 - 0.5 K/uL   Basophils Relative 0 %   Basophils Absolute 0.0 0.0 - 0.1 K/uL   Immature Granulocytes 1 %   Abs Immature Granulocytes 0.07 0.00 - 0.07 K/uL   Target Cells PRESENT     Comment: Performed at [facility], [place] [address], [place], [place] [address]  Magnesium     Status: None   Collection Time: 04/22/20  4:13 PM  Result Value Ref Range    Magnesium 1.9 1.7 - 2.4 mg/dL    Comment: Performed at [facility], [place] [address] [place], [place] [address]  Comprehensive metabolic panel     Status: Abnormal   Collection Time: 04/22/20  4:13 PM  Result Value Ref Range   Sodium 139 135 - 145 mmol/L   Potassium 2.8 (L) 3.5 - 5.1 mmol/L   Chloride 91 (L) 98 - 111 mmol/L   CO2 35 (H) 22 - 32 mmol/L   Glucose, Bld 109 (H) 70 - 99 mg/dL    Comment: Glucose reference range applies only to samples taken after fasting for at least 8 hours.   BUN 31 (H) 6 - 20 mg/dL   Creatinine, Ser 1.43 (H) 0.44 - 1.00 mg/dL   Calcium 8.0 (L) 8.9 - 10.3 mg/dL   Total Protein 6.6 6.5 - 8.1 g/dL   Albumin 2.2 (L) 3.5 - 5.0 g/dL   AST 25 15 - 41 U/L   ALT 15 0 - 44 U/L   Alkaline Phosphatase 157 (H) 38 - 126 U/L   Total Bilirubin 0.8 0.3 - 1.2 mg/dL   GFR, Estimated 43 (L) >60 mL/min    Comment: (NOTE) Calculated using the CKD-EPI Creatinine Equation (2021)    Anion gap 13 5 - 15    Comment: Performed at [facility], [place] [address] [place], [place] [address]  Glucose, capillary     Status: Abnormal   Collection Time: 04/22/20  4:49 PM  Result Value Ref Range   Glucose-Capillary 108 (H) 70 - 99 mg/dL    Comment: Glucose reference range applies only to samples taken after fasting for at least 8 hours.  Glucose, capillary     Status: None   Collection Time: 04/23/20  8:21 AM  Result Value Ref Range   Glucose-Capillary 97 70 - 99 mg/dL    Comment: Glucose reference range applies only to samples taken after fasting for at least 8 hours.  Basic metabolic panel     Status: Abnormal   Collection Time: 04/23/20  8:57 AM  Result Value Ref Range   Sodium 137 135 - 145 mmol/L   Potassium 2.8 (L) 3.5 - 5.1 mmol/L   Chloride 93 (L) 98 - 111 mmol/L   CO2 34 (H) 22 - 32 mmol/L  Glucose, Bld 100 (H) 70 - 99 mg/dL    Comment: Glucose reference range applies only to samples taken after fasting for at least 8 hours.    BUN 31 (H) 6 - 20 mg/dL   Creatinine, Ser 1.26 (H) 0.44 - 1.00 mg/dL   Calcium 7.8 (L) 8.9 - 10.3 mg/dL   GFR, Estimated 49 (L) >60 mL/min    Comment: (NOTE) Calculated using the CKD-EPI Creatinine Equation (2021)    Anion gap 10 5 - 15    Comment: Performed at [facility], [place] [address] [place], [place] [address]  Glucose, capillary     Status: None   Collection Time: 04/23/20 11:09 AM  Result Value Ref Range   Glucose-Capillary 91 70 - 99 mg/dL    Comment: Glucose reference range applies only to samples taken after fasting for at least 8 hours.  Glucose, capillary     Status: Abnormal   Collection Time: 04/23/20  4:34 PM  Result Value Ref Range   Glucose-Capillary 111 (H) 70 - 99 mg/dL    Comment: Glucose reference range applies only to samples taken after fasting for at least 8 hours.  Glucose, capillary     Status: Abnormal   Collection Time: 04/23/20  9:46 PM  Result Value Ref Range   Glucose-Capillary 113 (H) 70 - 99 mg/dL    Comment: Glucose reference range applies only to samples taken after fasting for at least 8 hours.  Glucose, capillary     Status: None   Collection Time: 04/24/20  7:21 AM  Result Value Ref Range   Glucose-Capillary 97 70 - 99 mg/dL    Comment: Glucose reference range applies only to samples taken after fasting for at least 8 hours.    DG Abd 1 View  Result Date: 04/23/2020 CLINICAL DATA:  Follow-up ileus EXAM: ABDOMEN - 1 VIEW COMPARISON:  04/22/20 FINDINGS: Persistent small bowel dilatation is noted with paucity of colonic gas. The degree of dilated small bowel has increased in the interval from the prior exam consistent with a progressive process no bony abnormality is seen. No free air is noted. IMPRESSION: Increase in the number of dilated small bowel loops when compared with the prior exam. No free air is noted. Electronically Signed   By: Inez Catalina M.D.   On: 04/23/2020 09:36    Imaging: Personally reviewed ct and  xray  A/P: Kishia Matchett is an 59 y.o. female with  Nausea, vomiting, small bowel distention More consistent with ileus Hypokalemia CVA October 2021 Bipolar disease CHF Diabetes mellitus type 2 Chronic kidney disease, Acute metabolic encephalopathy Peripheral arterial disease  She had a bowel movement yesterday and she had a bowel movement this morning.  Her exam and x-rays and the fact that she is having bowel movements is more consistent with an ileus.  Since patient is refusing NG tube I would leave it out Needs potassium corrected and maintained around a level of 4 Check magnesium and and correct to a level of 2 N.p.o. except meds/ice chips Repeat film Supplemental IV fluids  Leighton Ruff. Redmond Pulling, MD, FACS General, Bariatric, & Minimally Invasive Surgery [facility], [place]

## 2020-04-24 NOTE — Progress Notes (Signed)
Attempted NG when patient agreeable but pt quickly changed her mind-pulled tube, grabbing this nurses arm hard and screaming. Pt was educated she was at risk for bowel perforation. No evidence of learning. Several bouts of clear green bile vomit and was not able to hold down her night medications. Pt continues to refuse tylenol if able to discontinue tylenol order. Case management note states they have found a bed, but pt continues to have vomiting/stomach distension and is in non violent wrist restraints due to violent behavior with staff.

## 2020-04-24 NOTE — Progress Notes (Signed)
PROGRESS NOTE    Morgan Beasley  [id number] DOB: 1960-11-25 DOA: 03/21/2020 PCP: Riki Sheer, NP    Chief Complaint  Patient presents with  . Weakness  . Shortness of Breath    Brief Narrative:  59 year old lady with prior history of depression, bipolar disorder, substance abuse, pulmonary valve endocarditis and multiple CVAs presents to ED for agitation hallucinations and restlessness requiring Precedex drip in the ICU briefly psychiatric consulted and she was started on antipsychotics with slight improvement she was initially scheduled to be discharged to [facility] psychiatry unit but currently plan is for discharge home patient on April 18 2020 developed some abdominal distention associated with some nausea and vomiting.  CT abdomen and pelvis on 04/19/2020 showed small bowel distention possibly obstruction versus ileus.  An NG tube was placed but was pulled out by the patient  On 2 occasions. Overnight as per the RN patient had several bouts of vomiting with 2 bowel movements.     Assessment & Plan:   Principal Problem:   Acute metabolic encephalopathy Active Problems:   Peripheral neuropathy   COPD (chronic obstructive pulmonary disease) (HCC)   Diabetic peripheral neuropathy associated with type 2 diabetes mellitus (HCC)   Cerebral embolism with cerebral infarction   Mixed hyperlipidemia   Acute metabolic encephalopathy probably secondary to psychosis in the setting of bipolar disorder. Improving but not back to baseline. Pt currently on  Elavil 100 mg daily at bedtime,, buspirone 10 mg 3 times daily, carbamazepine 400 mg twice daily, olanzapine 5 mg daily at bedtime. No changes in meds.  Overnight pt became agitated, required wrist restraints to prevent her from pulling the midline.    COPD No wheezing heard.  continue with bronchodilators as needed.    Peripheral neuropathy secondary to type 2 diabetes Continue home with home medications. Recommend  outpatient follow-up with vascular surgery.    Type 2 diabetes mellitus CBG (last 3)  Recent Labs    04/23/20 2146 04/24/20 0721 04/24/20 1226  GLUCAP 113* 97 101*   Continue with SSI.  D/c long acting insulin, until her ileus resolves and she is back on carb modified diet.  No changes to meds.    Acute on chronic systolic heart failure Diuresed appropriately with Lasix.  Patient on Lasix(which is being held for),as pt is taking very little by mouth.  She appears euvolemic.    H/o CVA Imaging of the brain showed right and left occipital infarcts in addition to left basal ganglia and thalamic microhemorrhages Restart plavix and lipitor.    History of chronic pancreatitis Continue with Creon    Acute kidney injury on stage IIIa CKD Creatinine actually better than baseline.      Nausea vomiting and abdominal distention. CT of the abdomen pelvis showed distended small bowel consistent with obstruction versus ileus. NG tube was placed but it was pulled out by the patient .  Multiple bowel movements today. GEN surgery consulted, recommended she probably has ileus from electrolyte deficiencies. Recommended NPO except ice chips, replete electrolytes and monitor.     Hypokalemia and hypomagnesemia:  Replaced. Repeat in the morning.    Right supraclavicular/mediastinal adenopathy Recommend outpatient follow-up with oncology.     DVT prophylaxis: SCDs Code Status: Full code Family Communication: None at bedside, discussed with daughter over the phone on 12/23.  Disposition:   Status is: Inpatient  Remains inpatient appropriate because:Ongoing diagnostic testing needed not appropriate for outpatient work up and Unsafe d/c plan   Dispo: The patient is  from: Home              Anticipated d/c is to: SNF              Anticipated d/c date is: 2 days              Patient currently is not medically stable to d/c.       Consultants:   None  Procedures:  Abdominal x-ray pending Antimicrobials: None  Subjective: Agitated overnight, multiple episodes of vomiting, and 2 bowel movements.   Objective: Vitals:   04/23/20 2013 04/24/20 0122 04/24/20 0634 04/24/20 1445  BP: 127/65 (!) 146/82 94/62 121/70  Pulse: (!) 102 (!) 103 (!) 107 (!) 101  Resp: (!) 22 20 18 20   Temp: 97.9 F (36.6 C) 98.5 F (36.9 C) 98.1 F (36.7 C) 97.9 F (36.6 C)  TempSrc:  Oral  Axillary  SpO2: 100% 92% 94% 90%  Weight:   77.8 kg   Height:        Intake/Output Summary (Last 24 hours) at 04/24/2020 1716 Last data filed at 04/24/2020 0900 Gross per 24 hour  Intake 100 ml  Output --  Net 100 ml   Filed Weights   04/22/20 0500 04/23/20 0500 04/24/20 0634  Weight: 78.7 kg 78.8 kg 77.8 kg    Examination:  General exam: sleeping comfortably, not in distress.  Respiratory system: clear to auscultation, no wheezing heard.  Cardiovascular system: S1S2, TAchycardic, no JVD, no pedal edema.  Gastrointestinal system: Abdomen is soft , non tender non distended, bowel sounds wnl.  Central nervous system: lethargic and confused and in restraints.  Extremities: No pedal edema.  Psychiatry: cannot be assessed.    Data Reviewed: I have personally reviewed following labs and imaging studies  CBC: Recent Labs  Lab 04/19/20 1315 04/22/20 1613 04/24/20 1003  WBC 9.8 9.1 8.2  NEUTROABS  --  6.6  --   HGB 13.6 11.8* 12.1  HCT 43.8 38.4 39.7  MCV 76.7* 77.7* 75.8*  PLT 442* 458* 420*    Basic Metabolic Panel: Recent Labs  Lab 04/19/20 1315 04/20/20 0708 04/22/20 1613 04/23/20 0857 04/24/20 1003  NA 138 138 139 137 139  K 4.5 3.5 2.8* 2.8* 3.2*  CL 94* 90* 91* 93* 94*  CO2 32 31 35* 34* 32  GLUCOSE 158* 176* 109* 100* 121*  BUN 21* 28* 31* 31* 25*  CREATININE 1.36* 1.46* 1.43* 1.26* 1.13*  CALCIUM 8.3* 7.9* 8.0* 7.8* 7.8*  MG 1.3* 1.4* 1.9  --  1.9  PHOS  --   --   --   --  3.3    GFR: Estimated Creatinine Clearance: 57.1 mL/min (A) (by C-G  formula based on SCr of 1.13 mg/dL (H)).  Liver Function Tests: Recent Labs  Lab 04/22/20 1613  AST 25  ALT 15  ALKPHOS 157*  BILITOT 0.8  PROT 6.6  ALBUMIN 2.2*    CBG: Recent Labs  Lab 04/23/20 1109 04/23/20 1634 04/23/20 2146 04/24/20 0721 04/24/20 1226  GLUCAP 91 111* 113* 97 101*     Recent Results (from the past 240 hour(s))  Resp Panel by RT-PCR (Flu A&B, Covid) Nasopharyngeal Swab     Status: None   Collection Time: 04/15/20 10:20 AM   Specimen: Nasopharyngeal Swab; Nasopharyngeal(NP) swabs in vial transport medium  Result Value Ref Range Status   SARS Coronavirus 2 by RT PCR NEGATIVE NEGATIVE Final    Comment: (NOTE) SARS-CoV-2 target nucleic acids are NOT DETECTED.  The SARS-CoV-2 RNA  is generally detectable in upper respiratory specimens during the acute phase of infection. The lowest concentration of SARS-CoV-2 viral copies this assay can detect is 138 copies/mL. A negative result does not preclude SARS-Cov-2 infection and should not be used as the sole basis for treatment or other patient management decisions. A negative result may occur with  improper specimen collection/handling, submission of specimen other than nasopharyngeal swab, presence of viral mutation(s) within the areas targeted by this assay, and inadequate number of viral copies(<138 copies/mL). A negative result must be combined with clinical observations, patient history, and epidemiological information. The expected result is Negative.  Fact Sheet for Patients:  EntrepreneurPulse.com.au  Fact Sheet for Healthcare Providers:  IncredibleEmployment.be  This test is no t yet approved or cleared by the [place] FDA and  has been authorized for detection and/or diagnosis of SARS-CoV-2 by FDA under an Emergency Use Authorization (EUA). This EUA will remain  in effect (meaning this test can be used) for the duration of the COVID-19 declaration  under Section 564(b)(1) of the Act, 21 U.S.C.section 360bbb-3(b)(1), unless the authorization is terminated  or revoked sooner.       Influenza A by PCR NEGATIVE NEGATIVE Final   Influenza B by PCR NEGATIVE NEGATIVE Final    Comment: (NOTE) The Xpert Xpress SARS-CoV-2/FLU/RSV plus assay is intended as an aid in the diagnosis of influenza from Nasopharyngeal swab specimens and should not be used as a sole basis for treatment. Nasal washings and aspirates are unacceptable for Xpert Xpress SARS-CoV-2/FLU/RSV testing.  Fact Sheet for Patients: EntrepreneurPulse.com.au  Fact Sheet for Healthcare Providers: IncredibleEmployment.be  This test is not yet approved or cleared by the [place] FDA and has been authorized for detection and/or diagnosis of SARS-CoV-2 by FDA under an Emergency Use Authorization (EUA). This EUA will remain in effect (meaning this test can be used) for the duration of the COVID-19 declaration under Section 564(b)(1) of the Act, 21 U.S.C. section 360bbb-3(b)(1), unless the authorization is terminated or revoked.  Performed at [facility], [place] [address] [place], [place] [address]          Radiology Studies: DG Abd 1 View  Result Date: 04/23/2020 CLINICAL DATA:  Follow-up ileus EXAM: ABDOMEN - 1 VIEW COMPARISON:  04/22/20 FINDINGS: Persistent small bowel dilatation is noted with paucity of colonic gas. The degree of dilated small bowel has increased in the interval from the prior exam consistent with a progressive process no bony abnormality is seen. No free air is noted. IMPRESSION: Increase in the number of dilated small bowel loops when compared with the prior exam. No free air is noted. Electronically Signed   By: Inez Catalina M.D.   On: 04/23/2020 09:36        Scheduled Meds: . (feeding supplement) PROSource Plus  30 mL Oral BID BM  . acetaminophen  650 mg Oral Q6H  . amitriptyline   100 mg Oral QHS  . atorvastatin  20 mg Oral Daily  . busPIRone  10 mg Oral TID  . carbamazepine  400 mg Oral BID  . chlorhexidine  15 mL Mouth Rinse BID  . clopidogrel  75 mg Oral Daily  . collagenase   Topical Daily  . dicyclomine  10 mg Oral TID AC & HS  . feeding supplement (GLUCERNA SHAKE)  237 mL Oral BID BM  . insulin aspart  0-6 Units Subcutaneous TID WC  . insulin glargine  5 Units Subcutaneous QHS  . lipase/protease/amylase  72,000 Units Oral  TID WC  . magnesium oxide  400 mg Oral Daily  . mouth rinse  15 mL Mouth Rinse q12n4p  . metoprolol succinate  50 mg Oral Daily  . mometasone-formoterol  2 puff Inhalation BID  . multivitamin with minerals  1 tablet Oral Daily  . nicotine  14 mg Transdermal Daily  . OLANZapine  5 mg Oral QHS  . potassium chloride  40 mEq Oral Once  . sodium chloride flush  10-40 mL Intracatheter Q12H   Continuous Infusions:   LOS: 34 days        Hosie Poisson, MD [facility]   To contact the attending provider between 7A-7P or the covering provider during after hours 7P-7A, please log into the web site [email or web address] and access using universal [place] password for that web site. If you do not have the password, please call the hospital operator.  04/24/2020, 5:16 PM

## 2020-04-25 ENCOUNTER — Inpatient Hospital Stay (HOSPITAL_COMMUNITY): Payer: Medicare Other

## 2020-04-25 LAB — COMPREHENSIVE METABOLIC PANEL
ALT: 15 U/L (ref 0–44)
AST: 26 U/L (ref 15–41)
Albumin: 2 g/dL — ABNORMAL LOW (ref 3.5–5.0)
Alkaline Phosphatase: 168 U/L — ABNORMAL HIGH (ref 38–126)
Anion gap: 13 (ref 5–15)
BUN: 22 mg/dL — ABNORMAL HIGH (ref 6–20)
CO2: 30 mmol/L (ref 22–32)
Calcium: 8 mg/dL — ABNORMAL LOW (ref 8.9–10.3)
Chloride: 97 mmol/L — ABNORMAL LOW (ref 98–111)
Creatinine, Ser: 1.07 mg/dL — ABNORMAL HIGH (ref 0.44–1.00)
GFR, Estimated: >60 mL/min (ref >60)
Glucose, Bld: 121 mg/dL — ABNORMAL HIGH (ref 70–99)
Potassium: 3.6 mmol/L (ref 3.5–5.1)
Sodium: 140 mmol/L (ref 135–145)
Total Bilirubin: 0.6 mg/dL (ref 0.3–1.2)
Total Protein: 6.5 g/dL (ref 6.5–8.1)

## 2020-04-25 LAB — GLUCOSE, CAPILLARY
Glucose-Capillary: 113 mg/dL — ABNORMAL HIGH (ref 70–99)
Glucose-Capillary: 98 mg/dL (ref 70–99)
Glucose-Capillary: 99 mg/dL (ref 70–99)
Glucose-Capillary: 109 mg/dL — ABNORMAL HIGH (ref 70–99)
Glucose-Capillary: 108 mg/dL — ABNORMAL HIGH (ref 70–99)

## 2020-04-25 LAB — CBC
HCT: 41.8 % (ref 36.0–46.0)
Hemoglobin: 13.2 g/dL (ref 12.0–15.0)
MCH: 24.1 pg — ABNORMAL LOW (ref 26.0–34.0)
MCHC: 31.6 g/dL (ref 30.0–36.0)
MCV: 76.3 fL — ABNORMAL LOW (ref 80.0–100.0)
Platelets: 442 10*3/uL — ABNORMAL HIGH (ref 150–400)
RBC: 5.48 MIL/uL — ABNORMAL HIGH (ref 3.87–5.11)
RDW: 22.5 % — ABNORMAL HIGH (ref 11.5–15.5)
WBC: 9.9 10*3/uL (ref 4.0–10.5)
nRBC: 0 % (ref 0.0–0.2)

## 2020-04-25 MED ORDER — POTASSIUM CHLORIDE 10 MEQ/100ML IV SOLN
10.0000 meq | INTRAVENOUS | Status: AC
  Administered 2020-04-25 (×2): 10 meq via INTRAVENOUS
  Filled 2020-04-25 (×2): qty 100

## 2020-04-25 NOTE — Progress Notes (Signed)
PROGRESS NOTE    Morgan Beasley  TSV:[id number] DOB: Sep 05, 1960 DOA: 03/21/2020 PCP: Riki Sheer, NP    Chief Complaint  Patient presents with  . Weakness  . Shortness of Breath    Brief Narrative:  59 year old lady with prior history of depression, bipolar disorder, substance abuse, pulmonary valve endocarditis and multiple CVAs presents to ED for agitation hallucinations and restlessness requiring Precedex drip in the ICU briefly psychiatric consulted and she was started on antipsychotics with slight improvement she was initially scheduled to be discharged to [facility] psychiatry unit but currently plan is for discharge home patient on April 18 2020 developed some abdominal distention associated with some nausea and vomiting.  CT abdomen and pelvis on 04/19/2020 showed small bowel distention possibly obstruction versus ileus.  An NG tube was placed but was pulled out by the patient  On 2 occasions. Pt continues to have bowel movements daily, but x rays consistent with ileus.  General surgery consulted , recommended small bowel prep . Hopefully she will agree to take the prep.  Pt has been refusing care, sometimes requiring wrist restraints for agitation, will request palliative care consult for goals of care.     Assessment & Plan:   Principal Problem:   Acute metabolic encephalopathy Active Problems:   Peripheral neuropathy   COPD (chronic obstructive pulmonary disease) (HCC)   Diabetic peripheral neuropathy associated with type 2 diabetes mellitus (HCC)   Cerebral embolism with cerebral infarction   Mixed hyperlipidemia   Acute metabolic encephalopathy probably secondary to psychosis in the setting of bipolar disorder. No where close to baseline, appears to be calm sometimes but mostly refusing care and medications.   Pt currently on  Elavil 100 mg daily at bedtime,, buspirone 10 mg 3 times daily, carbamazepine 400 mg twice daily, olanzapine 5 mg daily , which has  been increased to 7.5 mg .  Overnight RN was able to take her off the restraints for some time.    COPD No wheezing heard.  continue with bronchodilators as needed.    Peripheral neuropathy secondary to type 2 diabetes Continue home with home medications. Recommend outpatient follow-up with vascular surgery.    Type 2 diabetes mellitus CBG (last 3)  Recent Labs    04/25/20 0626 04/25/20 0738 04/25/20 1154  GLUCAP 113* 98 99   Continue with SSI.  D/c long acting insulin, until her ileus resolves and she is back on carb modified diet.  No changes to meds.    Acute on chronic systolic heart failure Diuresed appropriately with Lasix.  Patient on Lasix(which is being held for),as pt is taking very little by mouth.  She appears euvolemic.    H/o CVA Imaging of the brain showed right and left occipital infarcts in addition to left basal ganglia and thalamic microhemorrhages Restart plavix and lipitor.    History of chronic pancreatitis Continue with Creon    Acute kidney injury on stage IIIa CKD Creatinine actually better than baseline. Creatinine at 1.    Nausea vomiting and abdominal distention. CT of the abdomen pelvis showed distended small bowel consistent with obstruction versus ileus. NG tube was placed but it was pulled out by the patient .  Daily bowel movements .  GEN surgery consulted, recommended she probably has ileus from electrolyte deficiencies. Recommended NPO except ice chips, replete electrolytes and monitor.  Keep potassium >4, magnesium >2.     Hypokalemia and hypomagnesemia:  Replaced. Repeat in the morning.    Right supraclavicular/mediastinal adenopathy Recommend  outpatient follow-up with oncology.     DVT prophylaxis: SCDs Code Status: Full code Family Communication: None at bedside, discussed with daughter over the phone on 12/26.  Disposition:   Status is: Inpatient  Remains inpatient appropriate because:Ongoing diagnostic  testing needed not appropriate for outpatient work up and Unsafe d/c plan   Dispo: The patient is from: Home              Anticipated d/c is to: SNF              Anticipated d/c date is: 2 days              Patient currently is not medically stable to d/c.       Consultants:   None  Procedures: Abdominal x-ray pending Antimicrobials: None  Subjective: Calm and sleeping, no distress noted.   Objective: Vitals:   04/24/20 1936 04/25/20 0455 04/25/20 0500 04/25/20 1330  BP:  125/76  140/83  Pulse:  (!) 105  (!) 105  Resp:  20  20  Temp:  (!) 97.5 F (36.4 C)  98.4 F (36.9 C)  TempSrc:  Oral  Axillary  SpO2: 92% 99%  97%  Weight:   77.1 kg   Height:   5\' 6"  (1.676 m)     Intake/Output Summary (Last 24 hours) at 04/25/2020 1508 Last data filed at 04/25/2020 1130 Gross per 24 hour  Intake 11.53 ml  Output 310 ml  Net -298.47 ml   Filed Weights   04/23/20 0500 04/24/20 0634 04/25/20 0500  Weight: 78.8 kg 77.8 kg 77.1 kg    Examination:  General exam: Well-developed lady, not in any kind of distress Respiratory system: Clear to auscultation bilaterally no wheezing heard  Cardiovascular system: S1-S2 heard, mildly tachycardic, no JVD, no pedal edema Gastrointestinal system: Abdomen is soft, nontender mildly distended abdomen, bowel sounds normal Central nervous system: Confused but not in distress Extremities: No pedal edema.  Psychiatry: Confused cannot be assessed   Data Reviewed: I have personally reviewed following labs and imaging studies  CBC: Recent Labs  Lab 04/19/20 1315 04/22/20 1613 04/24/20 1003 04/25/20 0528  WBC 9.8 9.1 8.2 9.9  NEUTROABS  --  6.6  --   --   HGB 13.6 11.8* 12.1 13.2  HCT 43.8 38.4 39.7 41.8  MCV 76.7* 77.7* 75.8* 76.3*  PLT 442* 458* 420* 442*    Basic Metabolic Panel: Recent Labs  Lab 04/19/20 1315 04/20/20 0708 04/22/20 1613 04/23/20 0857 04/24/20 1003 04/25/20 0528  NA 138 138 139 137 139 140  K 4.5 3.5  2.8* 2.8* 3.2* 3.6  CL 94* 90* 91* 93* 94* 97*  CO2 32 31 35* 34* 32 30  GLUCOSE 158* 176* 109* 100* 121* 121*  BUN 21* 28* 31* 31* 25* 22*  CREATININE 1.36* 1.46* 1.43* 1.26* 1.13* 1.07*  CALCIUM 8.3* 7.9* 8.0* 7.8* 7.8* 8.0*  MG 1.3* 1.4* 1.9  --  1.9  --   PHOS  --   --   --   --  3.3  --     GFR: Estimated Creatinine Clearance: 60.1 mL/min (A) (by C-G formula based on SCr of 1.07 mg/dL (H)).  Liver Function Tests: Recent Labs  Lab 04/22/20 1613 04/25/20 0528  AST 25 26  ALT 15 15  ALKPHOS 157* 168*  BILITOT 0.8 0.6  PROT 6.6 6.5  ALBUMIN 2.2* 2.0*    CBG: Recent Labs  Lab 04/24/20 1753 04/24/20 2143 04/25/20 0626 04/25/20 0738 04/25/20 1154  GLUCAP 106* 112* 113* 98 99     No results found for this or any previous visit (from the past 240 hour(s)).       Radiology Studies: DG Abd 1 View  Result Date: 04/25/2020 CLINICAL DATA:  Abdominal distension EXAM: ABDOMEN - 1 VIEW COMPARISON:  04/23/2020 FINDINGS: Multiple gas distended mid abdominal small bowel loops, similar in number and overall degree of distension since previous exam. Colon remains decompressed. No abnormal abdominal calcifications. Lower lumbar spondylitic changes. IMPRESSION: Persistent small bowel distention, may represent partial obstruction or adynamic ileus. Electronically Signed   By: Lucrezia Europe M.D.   On: 04/25/2020 08:55        Scheduled Meds: . (feeding supplement) PROSource Plus  30 mL Oral BID BM  . acetaminophen  650 mg Oral Q6H  . amitriptyline  100 mg Oral QHS  . atorvastatin  20 mg Oral Daily  . busPIRone  10 mg Oral TID  . carbamazepine  400 mg Oral BID  . chlorhexidine  15 mL Mouth Rinse BID  . clopidogrel  75 mg Oral Daily  . collagenase   Topical Daily  . dicyclomine  10 mg Oral TID AC & HS  . feeding supplement (GLUCERNA SHAKE)  237 mL Oral BID BM  . insulin aspart  0-6 Units Subcutaneous TID WC  . insulin glargine  5 Units Subcutaneous QHS  .  lipase/protease/amylase  72,000 Units Oral TID WC  . magnesium oxide  400 mg Oral Daily  . mouth rinse  15 mL Mouth Rinse q12n4p  . metoprolol succinate  50 mg Oral Daily  . mometasone-formoterol  2 puff Inhalation BID  . multivitamin with minerals  1 tablet Oral Daily  . nicotine  14 mg Transdermal Daily  . OLANZapine  7.5 mg Oral QHS  . sodium chloride flush  10-40 mL Intracatheter Q12H   Continuous Infusions: . sodium chloride Stopped (04/25/20 0010)  . potassium chloride       LOS: 35 days        Hosie Poisson, MD [facility]   To contact the attending provider between 7A-7P or the covering provider during after hours 7P-7A, please log into the web site [email or web address] and access using universal [place] password for that web site. If you do not have the password, please call the hospital operator.  04/25/2020, 3:08 PM

## 2020-04-25 NOTE — Progress Notes (Signed)
PO medications offered. Pt refused. Explanation provided and pt is not agreeable at this time. Pt has intermittent episodes of agitation.

## 2020-04-25 NOTE — Progress Notes (Signed)
Patient blood sugar was 112. Notified NP on call due to not feeling comfortable giving pt the insulin while pt is NPO. NP on call gave the okay to hold insulin. Also, nurse tech notified nurse that pt refused the first set of routine vitals.

## 2020-04-25 NOTE — Plan of Care (Signed)
  Problem: Elimination: Goal: Will not experience complications related to urinary retention Outcome: Progressing   Problem: Pain Managment: Goal: General experience of comfort will improve Outcome: Progressing   Problem: Safety: Goal: Ability to remain free from injury will improve Outcome: Progressing   Problem: Skin Integrity: Goal: Risk for impaired skin integrity will decrease Outcome: Progressing   Problem: Health Behavior/Discharge Planning: Goal: Ability to remain free from injury will improve Outcome: Progressing   Problem: Skin Integrity: Goal: Demonstration of wound healing without infection will improve Outcome: Progressing   Problem: Activity: Goal: Capacity to carry out activities will improve Outcome: Progressing

## 2020-04-25 NOTE — Progress Notes (Signed)
   Subjective/Chief Complaint: No abdominal complaints. Had a bm yesterday. abd xrays still show some dilated small bowel   Objective: Vital signs in last 24 hours: Temp:  [97.5 F (36.4 C)-97.9 F (36.6 C)] 97.5 F (36.4 C) (12/26 0455) Pulse Rate:  [101-105] 105 (12/26 0455) Resp:  [20] 20 (12/26 0455) BP: (121-125)/(70-76) 125/76 (12/26 0455) SpO2:  [90 %-99 %] 99 % (12/26 0455) Weight:  [77.1 kg] 77.1 kg (12/26 0500) Last BM Date: 04/24/20  Intake/Output from previous day: 12/25 0701 - 12/26 0700 In: 11.5 [I.V.:11.5] Out: 310 [Urine:310] Intake/Output this shift: No intake/output data recorded.  General appearance: alert and cooperative Resp: clear to auscultation bilaterally Cardio: regular rate and rhythm GI: soft, nontender. may have some mild distension  Lab Results:  Recent Labs    04/24/20 1003 04/25/20 0528  WBC 8.2 9.9  HGB 12.1 13.2  HCT 39.7 41.8  PLT 420* 442*   BMET Recent Labs    04/24/20 1003 04/25/20 0528  NA 139 140  K 3.2* 3.6  CL 94* 97*  CO2 32 30  GLUCOSE 121* 121*  BUN 25* 22*  CREATININE 1.13* 1.07*  CALCIUM 7.8* 8.0*   PT/INR No results for input(s): LABPROT, INR in the last 72 hours. ABG No results for input(s): PHART, HCO3 in the last 72 hours.  Invalid input(s): PCO2, PO2  Studies/Results: DG Abd 1 View  Result Date: 04/25/2020 CLINICAL DATA:  Abdominal distension EXAM: ABDOMEN - 1 VIEW COMPARISON:  04/23/2020 FINDINGS: Multiple gas distended mid abdominal small bowel loops, similar in number and overall degree of distension since previous exam. Colon remains decompressed. No abnormal abdominal calcifications. Lower lumbar spondylitic changes. IMPRESSION: Persistent small bowel distention, may represent partial obstruction or adynamic ileus. Electronically Signed   By: Lucrezia Europe M.D.   On: 04/25/2020 08:55    Anti-infectives: Anti-infectives (From admission, onward)   Start     Dose/Rate Route Frequency Ordered  Stop   03/21/20 1800  ceFEPIme (MAXIPIME) 2 g in sodium chloride 0.9 % 100 mL IVPB        2 g 200 mL/hr over 30 Minutes Intravenous Every 8 hours 03/21/20 1621 03/22/20 1332      Assessment/Plan: s/p * No surgery found * SBO vs Ileus. Pt is moving some through the GI tract. Could consider small bowel protocol and have her drink contrast. No indication for urgent surgery at this point. Will follow Hypokalemia CVA October 2021 Bipolar disease CHF Diabetes mellitus type 2 Chronic kidney disease, Acute metabolic encephalopathy Peripheral arterial disease  LOS: 35 days    Morgan Beasley 04/25/2020

## 2020-04-25 NOTE — Progress Notes (Signed)
Bedside report received from Night RN. Night RN reports restraints removed on night shift. Pt was sitting on side of the bed. Night RN assisted pt back to bed. Bed alarm set. Call bell is within reach of patient. Will continue to monitor.

## 2020-04-26 ENCOUNTER — Inpatient Hospital Stay (HOSPITAL_COMMUNITY): Payer: Medicare Other

## 2020-04-26 DIAGNOSIS — G9341 Metabolic encephalopathy: Secondary | ICD-10-CM | POA: Diagnosis not present

## 2020-04-26 DIAGNOSIS — K567 Ileus, unspecified: Secondary | ICD-10-CM | POA: Diagnosis not present

## 2020-04-26 DIAGNOSIS — E1142 Type 2 diabetes mellitus with diabetic polyneuropathy: Secondary | ICD-10-CM | POA: Diagnosis not present

## 2020-04-26 DIAGNOSIS — J441 Chronic obstructive pulmonary disease with (acute) exacerbation: Secondary | ICD-10-CM | POA: Diagnosis not present

## 2020-04-26 LAB — GLUCOSE, CAPILLARY
Glucose-Capillary: 90 mg/dL (ref 70–99)
Glucose-Capillary: 100 mg/dL — ABNORMAL HIGH (ref 70–99)
Glucose-Capillary: 126 mg/dL — ABNORMAL HIGH (ref 70–99)
Glucose-Capillary: 153 mg/dL — ABNORMAL HIGH (ref 70–99)

## 2020-04-26 MED ORDER — RESOURCE INSTANT PROTEIN PO PWD PACKET
1.0000 | Freq: Three times a day (TID) | ORAL | Status: DC
  Administered 2020-04-26: 6 g via ORAL
  Filled 2020-04-26 (×8): qty 6

## 2020-04-26 NOTE — Plan of Care (Signed)
  Problem: Clinical Measurements: Goal: Respiratory complications will improve Outcome: Progressing   Problem: Elimination: Goal: Will not experience complications related to bowel motility Outcome: Progressing Goal: Will not experience complications related to urinary retention Outcome: Progressing   Problem: Pain Managment: Goal: General experience of comfort will improve Outcome: Progressing   Problem: Safety: Goal: Ability to remain free from injury will improve Outcome: Progressing   Problem: Skin Integrity: Goal: Risk for impaired skin integrity will decrease Outcome: Progressing   Problem: Education: Goal: Ability to incorporate positive changes in behavior to improve self-esteem will improve Outcome: Progressing   Problem: Health Behavior/Discharge Planning: Goal: Ability to remain free from injury will improve Outcome: Progressing   Problem: Skin Integrity: Goal: Demonstration of wound healing without infection will improve Outcome: Progressing   Problem: Activity: Goal: Capacity to carry out activities will improve Outcome: Progressing

## 2020-04-26 NOTE — Progress Notes (Signed)
PROGRESS NOTE    Morgan Beasley  [id number] DOB: 1960/08/12 DOA: 03/21/2020 PCP: Riki Sheer, NP    Chief Complaint  Patient presents with  . Weakness  . Shortness of Breath    Brief Narrative:  59 year old lady with prior history of depression, bipolar disorder, substance abuse, pulmonary valve endocarditis and multiple CVAs presents to ED for agitation hallucinations and restlessness requiring Precedex drip in the ICU briefly psychiatric consulted and she was started on antipsychotics with slight improvement she was initially scheduled to be discharged to [facility] psychiatry unit but currently plan is for discharge home patient on April 18 2020 developed some abdominal distention associated with some nausea and vomiting.  CT abdomen and pelvis on 04/19/2020 showed small bowel distention possibly obstruction versus ileus.  An NG tube was placed but was pulled out by the patient on 2 occasions.  Pt continues to have bowel movements daily, but x rays consistent with ileus.  General surgery consulted, recommended conservative management at this time.   Overnight patient had bowel movement, did not require any restraints for agitation.  Meanwhile due to her poor oral intake,  poor progression,  palliative care consulted for goals of care discussion.   Assessment & Plan:   Principal Problem:   Acute metabolic encephalopathy Active Problems:   Peripheral neuropathy   COPD (chronic obstructive pulmonary disease) (HCC)   Diabetic peripheral neuropathy associated with type 2 diabetes mellitus (HCC)   Cerebral embolism with cerebral infarction   Mixed hyperlipidemia   Acute metabolic encephalopathy probably secondary to psychosis in the setting of bipolar disorder. Improving, patient did not require any wrist restraints for agitation overnight.  Pt currently on  Elavil 100 mg daily at bedtime,, buspirone 10 mg 3 times daily, carbamazepine 400 mg twice daily, olanzapine 5 mg  daily , which has been increased to 7.5 mg .  She is sleepy but able to answer some simple questions.   COPD No wheezing heard.  continue with bronchodilators as needed.    Peripheral neuropathy secondary to type 2 diabetes Continue home with home medications. Recommend outpatient follow-up with vascular surgery.    Type 2 diabetes mellitus CBG (last 3)  Recent Labs    04/25/20 2105 04/26/20 0739 04/26/20 1123  GLUCAP 108* 90 100*   D/c long acting insulin, until her ileus resolves and she is back on carb modified diet.  Continue with sliding scale insulin, no changes in her diet.  Patient started on clear liquid diet today   Acute on chronic systolic heart failure Diuresed appropriately with Lasix.  Patient on Lasix(which is being held for),as pt is taking very little by mouth.  She appears euvolemic.    H/o CVA Imaging of the brain showed right and left occipital infarcts in addition to left basal ganglia and thalamic microhemorrhages Restarted plavix and lipitor.    History of chronic pancreatitis Continue with Creon    Acute kidney injury on stage IIIa CKD Creatinine actually better than baseline. Creatinine at 1.    Nausea vomiting and abdominal distention. CT of the abdomen pelvis showed distended small bowel consistent with obstruction versus ileus. NG tube was placed but it was pulled out by the patient .  Daily bowel movements .  GEN surgery consulted, recommended she probably has ileus from electrolyte deficiencies. Recommended NPO except ice chips, replete electrolytes and monitor.  Keep potassium >4, magnesium >2.  Repeat labs in the morning.    Hypokalemia and hypomagnesemia:  Replaced.    Right  supraclavicular/mediastinal adenopathy Recommend outpatient follow-up with oncology.     DVT prophylaxis: SCDs Code Status: Full code Family Communication: None at bedside, discussed with daughter over the phone on 12/26.  Disposition:    Status is: Inpatient  Remains inpatient appropriate because:Ongoing diagnostic testing needed not appropriate for outpatient work up and Unsafe d/c plan   Dispo: The patient is from: Home              Anticipated d/c is to: SNF              Anticipated d/c date is: 2 days              Patient currently is not medically stable to d/c.       Consultants:   General surgery  Palliative care  Procedures: Abdominal x-ray showed persistent dilated loops of bowels Antimicrobials: None  Subjective: Calm, no distress noted.  Objective: Vitals:   04/26/20 0456 04/26/20 0500 04/26/20 0827 04/26/20 1340  BP: (!) 142/76   (!) 143/99  Pulse: (!) 103   (!) 106  Resp: 18   18  Temp: 97.8 F (36.6 C)   (!) 97.4 F (36.3 C)  TempSrc: Oral     SpO2: 96%  97% 94%  Weight:  77.3 kg    Height:  5\' 6"  (1.676 m)      Intake/Output Summary (Last 24 hours) at 04/26/2020 1555 Last data filed at 04/26/2020 1500 Gross per 24 hour  Intake 720 ml  Output --  Net 720 ml   Filed Weights   04/24/20 0634 04/25/20 0500 04/26/20 0500  Weight: 77.8 kg 77.1 kg 77.3 kg    Examination:  General exam: Alert and comfortable not in any distress Respiratory system: Clear to auscultation bilaterally, no wheezing or rhonchi Cardiovascular system: S1-S2 heard, no JVD no pedal edema Gastrointestinal system: Abdomen is soft nontender, bowel sounds normal Central nervous system: Slightly confused but not agitated Extremities: No cyanosis Psychiatry: Confused cannot be assessed   Data Reviewed: I have personally reviewed following labs and imaging studies  CBC: Recent Labs  Lab 04/22/20 1613 04/24/20 1003 04/25/20 0528  WBC 9.1 8.2 9.9  NEUTROABS 6.6  --   --   HGB 11.8* 12.1 13.2  HCT 38.4 39.7 41.8  MCV 77.7* 75.8* 76.3*  PLT 458* 420* 442*    Basic Metabolic Panel: Recent Labs  Lab 04/20/20 0708 04/22/20 1613 04/23/20 0857 04/24/20 1003 04/25/20 0528  NA 138 139 137 139 140   K 3.5 2.8* 2.8* 3.2* 3.6  CL 90* 91* 93* 94* 97*  CO2 31 35* 34* 32 30  GLUCOSE 176* 109* 100* 121* 121*  BUN 28* 31* 31* 25* 22*  CREATININE 1.46* 1.43* 1.26* 1.13* 1.07*  CALCIUM 7.9* 8.0* 7.8* 7.8* 8.0*  MG 1.4* 1.9  --  1.9  --   PHOS  --   --   --  3.3  --     GFR: Estimated Creatinine Clearance: 60.2 mL/min (A) (by C-G formula based on SCr of 1.07 mg/dL (H)).  Liver Function Tests: Recent Labs  Lab 04/22/20 1613 04/25/20 0528  AST 25 26  ALT 15 15  ALKPHOS 157* 168*  BILITOT 0.8 0.6  PROT 6.6 6.5  ALBUMIN 2.2* 2.0*    CBG: Recent Labs  Lab 04/25/20 1154 04/25/20 1649 04/25/20 2105 04/26/20 0739 04/26/20 1123  GLUCAP 99 109* 108* 90 100*     No results found for this or any previous visit (from the past  240 hour(s)).       Radiology Studies: DG Abd 1 View  Result Date: 04/26/2020 CLINICAL DATA:  Constipation EXAM: ABDOMEN - 1 VIEW COMPARISON:  April 25, 2020 FINDINGS: Loops of dilated small bowel again noted. There is moderate stool in the colon. No air-fluid levels. No free air. Visualized lung bases clear. IMPRESSION: Dilated loops of small bowel again noted. Suspect ileus, although a degree of partial bowel obstruction cannot be excluded entirely. No free air evident on supine examination. Electronically Signed   By: Bretta Bang III M.D.   On: 04/26/2020 10:53   DG Abd 1 View  Result Date: 04/25/2020 CLINICAL DATA:  Abdominal distension EXAM: ABDOMEN - 1 VIEW COMPARISON:  04/23/2020 FINDINGS: Multiple gas distended mid abdominal small bowel loops, similar in number and overall degree of distension since previous exam. Colon remains decompressed. No abnormal abdominal calcifications. Lower lumbar spondylitic changes. IMPRESSION: Persistent small bowel distention, may represent partial obstruction or adynamic ileus. Electronically Signed   By: Corlis Leak M.D.   On: 04/25/2020 08:55        Scheduled Meds: . (feeding supplement) PROSource  Plus  30 mL Oral BID BM  . acetaminophen  650 mg Oral Q6H  . amitriptyline  100 mg Oral QHS  . atorvastatin  20 mg Oral Daily  . busPIRone  10 mg Oral TID  . carbamazepine  400 mg Oral BID  . chlorhexidine  15 mL Mouth Rinse BID  . clopidogrel  75 mg Oral Daily  . collagenase   Topical Daily  . dicyclomine  10 mg Oral TID AC & HS  . feeding supplement (GLUCERNA SHAKE)  237 mL Oral BID BM  . insulin aspart  0-6 Units Subcutaneous TID WC  . insulin glargine  5 Units Subcutaneous QHS  . lipase/protease/amylase  72,000 Units Oral TID WC  . magnesium oxide  400 mg Oral Daily  . mouth rinse  15 mL Mouth Rinse q12n4p  . metoprolol succinate  50 mg Oral Daily  . mometasone-formoterol  2 puff Inhalation BID  . multivitamin with minerals  1 tablet Oral Daily  . nicotine  14 mg Transdermal Daily  . OLANZapine  7.5 mg Oral QHS  . sodium chloride flush  10-40 mL Intracatheter Q12H   Continuous Infusions: . sodium chloride Stopped (04/25/20 0010)     LOS: 36 days        Kathlen Mody, MD [facility]   To contact the attending provider between 7A-7P or the covering provider during after hours 7P-7A, please log into the web site [email or web address] and access using universal [place] password for that web site. If you do not have the password, please call the hospital operator.  04/26/2020, 3:55 PM

## 2020-04-26 NOTE — Progress Notes (Signed)
Patient's blood sugar was 108. Patient is NPO. Notified on call provider to hold insulin. On call provider gave the okay to hold the insulin.

## 2020-04-26 NOTE — Progress Notes (Signed)
Called by Lab regarding [place].  IVT notes states NBR. I attempted with a reposition of tubing and NBR noted.

## 2020-04-26 NOTE — TOC Progression Note (Signed)
Transition of Care [facility]) - Progression Note    Patient Details  Name: Morgan Beasley MRN: [id number] Date of Birth: 1960/12/02  Transition of Care [facility]) CM/SW Contact  Jessy Cybulski, [name], RN Phone Number: 04/26/2020, 10:11 AM  Clinical Narrative:  Narda Amber pIens-started auth await Rexene Alberts following.     Expected Discharge Plan: Skilled Nursing Facility Barriers to Discharge: Insurance Authorization  Expected Discharge Plan and Services Expected Discharge Plan: [place]   Discharge Planning Services: CM Consult Post Acute Care Choice: [place] Living arrangements for the past 2 months: [place] Expected Discharge Date: 04/15/20                         HH Arranged: PT,OT [place]: [place] Date HH Agency Contacted: 04/20/20 Time [place]: [id number] Representative spoke with at [place]: [place]) Interventions    Readmission Risk Interventions Readmission Risk Prevention Plan 03/18/2020 10/21/2019  Transportation Screening Complete Complete  Medication Review Press photographer) - Complete  PCP or Specialist appointment within 3-5 days of discharge Not Complete Complete  PCP/Specialist Appt Not Complete comments First available appt 12/1 -  [place] or [place] Complete Complete  SW Recovery Care/Counseling Consult Complete Complete  Palliative Care Screening Not Applicable Not [place] Not Applicable Not Applicable  Some recent data might be hidden

## 2020-04-26 NOTE — Progress Notes (Signed)
Nutrition Follow-up  DOCUMENTATION CODES:   Obesity unspecified  INTERVENTION:   If diet unable to be advanced further, will need to consider nutrition support initiation.  -Continue Prosource Plus PO BID, each provides 100 kcals and 15g protein  -Beneprotein TID with meals, each provides 25 kcals and 6g protein  -Multivitamin with minerals daily  Once diet advanced: -Resume Glucerna Shake po TID, each supplement provides 220 kcal and 10 grams of protein   NUTRITION DIAGNOSIS:   Increased nutrient needs related to acute illness,wound healing as evidenced by estimated needs.  Ongoing.  GOAL:   Patient will meet greater than or equal to 90% of their needs  Not meeting on clears  MONITOR:   PO intake,Supplement acceptance,Labs,Weight trends  ASSESSMENT:   59 y.o. female with medical history of depression, bipolar disorder, substance abuse, pulmonary valve endocarditis, and multiple CVAs. She presented to the ED due to agitation, hallucinations, and restlessness. Patient was initially managed on Precedex drip in the ICU. She found to have large pleural effusions and pulmonary edema.  Patient started on antipsychotics therapy with improvement of symptoms and is currently awaiting SNF placement. She developed abdominal distention and N/V on 12/19.  Per surgery note, pt with ileus vs SBO.  Now on clear liquids. Will add protein supplements. If diet unable to be advanced further, will need to consider nutrition support initiation.  NGT was placed 12/21, pt removed on 12/22.  Admission weight: 217 lbs. Current weight: 170 lbs. Weight change of -47 lbs.  Medications: Creon, MAG-OX, Multivitamin with minerals daily  Labs reviewed: CBGs: 100-126  Diet Order:   Diet Order            Diet clear liquid Room service appropriate? Yes; Fluid consistency: Thin  Diet effective now           Diet Carb Modified                 EDUCATION NEEDS:   Not appropriate for  education at this time  Skin:  Skin Integrity Issues:: Other (Comment) Other: non-healing L foot wound; non-pressure injury  Last BM:  12/25 -type 7  Height:   Ht Readings from Last 1 Encounters:  04/26/20 5\' 6"  (1.676 m)    Weight:   Wt Readings from Last 1 Encounters:  04/26/20 77.3 kg   BMI:  Body mass index is 27.51 kg/m.  Estimated Nutritional Needs:   Kcal:  1800-2000 kcal  Protein:  90-105 grams  Fluid:  >/= 2 L/day  04/28/20, MS, RD, LDN Inpatient Clinical Dietitian Contact information available via Amion

## 2020-04-26 NOTE — Progress Notes (Signed)
Progress Note: General Surgery Service   Chief Complaint/Subjective: No complaints of nausea, states had BM yesterday  Objective: Vital signs in last 24 hours: Temp:  [97.8 F (36.6 C)-98.7 F (37.1 C)] 97.8 F (36.6 C) (12/27 0456) Pulse Rate:  [101-105] 103 (12/27 0456) Resp:  [18-20] 18 (12/27 0456) BP: (119-142)/(69-83) 142/76 (12/27 0456) SpO2:  [93 %-97 %] 97 % (12/27 0827) Weight:  [77.3 kg] 77.3 kg (12/27 0500) Last BM Date: 04/24/20  Intake/Output from previous day: No intake/output data recorded. Intake/Output this shift: No intake/output data recorded.  Gen: NAd  Resp: nonlabored  Card: tachycardic  Abd: soft, NT, ND  Lab Results: CBC  Recent Labs    04/24/20 1003 04/25/20 0528  WBC 8.2 9.9  HGB 12.1 13.2  HCT 39.7 41.8  PLT 420* 442*   BMET Recent Labs    04/24/20 1003 04/25/20 0528  NA 139 140  K 3.2* 3.6  CL 94* 97*  CO2 32 30  GLUCOSE 121* 121*  BUN 25* 22*  CREATININE 1.13* 1.07*  CALCIUM 7.8* 8.0*   PT/INR No results for input(s): LABPROT, INR in the last 72 hours. ABG No results for input(s): PHART, HCO3 in the last 72 hours.  Invalid input(s): PCO2, PO2  Anti-infectives: Anti-infectives (From admission, onward)   Start     Dose/Rate Route Frequency Ordered Stop   03/21/20 1800  ceFEPIme (MAXIPIME) 2 g in sodium chloride 0.9 % 100 mL IVPB        2 g 200 mL/hr over 30 Minutes Intravenous Every 8 hours 03/21/20 1621 03/22/20 1332      Medications: Scheduled Meds: . (feeding supplement) PROSource Plus  30 mL Oral BID BM  . acetaminophen  650 mg Oral Q6H  . amitriptyline  100 mg Oral QHS  . atorvastatin  20 mg Oral Daily  . busPIRone  10 mg Oral TID  . carbamazepine  400 mg Oral BID  . chlorhexidine  15 mL Mouth Rinse BID  . clopidogrel  75 mg Oral Daily  . collagenase   Topical Daily  . dicyclomine  10 mg Oral TID AC & HS  . feeding supplement (GLUCERNA SHAKE)  237 mL Oral BID BM  . insulin aspart  0-6 Units  Subcutaneous TID WC  . insulin glargine  5 Units Subcutaneous QHS  . lipase/protease/amylase  72,000 Units Oral TID WC  . magnesium oxide  400 mg Oral Daily  . mouth rinse  15 mL Mouth Rinse q12n4p  . metoprolol succinate  50 mg Oral Daily  . mometasone-formoterol  2 puff Inhalation BID  . multivitamin with minerals  1 tablet Oral Daily  . nicotine  14 mg Transdermal Daily  . OLANZapine  7.5 mg Oral QHS  . sodium chloride flush  10-40 mL Intracatheter Q12H   Continuous Infusions: . sodium chloride Stopped (04/25/20 0010)   PRN Meds:.sodium chloride, albuterol, docusate sodium, haloperidol lactate, hydrALAZINE, hydrocortisone cream, lipase/protease/amylase, ondansetron (ZOFRAN) IV, ondansetron, oxyCODONE, polyethylene glycol, simethicone, sodium chloride flush, sodium chloride flush  Assessment/Plan: Ileus vs SBO - will advance to clear liquids   LOS: 36 days   Mickeal Skinner, MD [facility]

## 2020-04-26 NOTE — Plan of Care (Signed)
  Problem: Safety: Goal: Ability to remain free from injury will improve Outcome: Progressing   

## 2020-04-27 DIAGNOSIS — Z515 Encounter for palliative care: Secondary | ICD-10-CM | POA: Diagnosis not present

## 2020-04-27 DIAGNOSIS — J441 Chronic obstructive pulmonary disease with (acute) exacerbation: Secondary | ICD-10-CM | POA: Diagnosis not present

## 2020-04-27 DIAGNOSIS — K567 Ileus, unspecified: Secondary | ICD-10-CM | POA: Diagnosis not present

## 2020-04-27 DIAGNOSIS — I63431 Cerebral infarction due to embolism of right posterior cerebral artery: Secondary | ICD-10-CM | POA: Diagnosis not present

## 2020-04-27 DIAGNOSIS — G9341 Metabolic encephalopathy: Secondary | ICD-10-CM | POA: Diagnosis not present

## 2020-04-27 DIAGNOSIS — E1142 Type 2 diabetes mellitus with diabetic polyneuropathy: Secondary | ICD-10-CM | POA: Diagnosis not present

## 2020-04-27 LAB — BASIC METABOLIC PANEL
Anion gap: 12 (ref 5–15)
BUN: 17 mg/dL (ref 6–20)
CO2: 28 mmol/L (ref 22–32)
Calcium: 8 mg/dL — ABNORMAL LOW (ref 8.9–10.3)
Chloride: 101 mmol/L (ref 98–111)
Creatinine, Ser: 0.98 mg/dL (ref 0.44–1.00)
GFR, Estimated: >60 mL/min (ref >60)
Glucose, Bld: 98 mg/dL (ref 70–99)
Potassium: 3.7 mmol/L (ref 3.5–5.1)
Sodium: 141 mmol/L (ref 135–145)

## 2020-04-27 LAB — GLUCOSE, CAPILLARY
Glucose-Capillary: 107 mg/dL — ABNORMAL HIGH (ref 70–99)
Glucose-Capillary: 112 mg/dL — ABNORMAL HIGH (ref 70–99)
Glucose-Capillary: 154 mg/dL — ABNORMAL HIGH (ref 70–99)
Glucose-Capillary: 133 mg/dL — ABNORMAL HIGH (ref 70–99)

## 2020-04-27 NOTE — TOC Progression Note (Addendum)
Transition of Care [facility]) - Progression Note    Patient Details  Name: Morgan Beasley MRN: [id number] Date of Birth: 12/14/1960  Transition [facility] Care [facility]) CM/SW Contact  Ronnita Paz, Olegario Messier, RN Phone Number: 04/27/2020, 10:15 AM  Clinical Narrative: Robbie [place]-rep Tina awaiting Berkley Harvey.  3p-[facility] [employer] medicare auth initiated-ref#[id number];faxed clinicals [phone]-await outcome-noted PT/OT reflect behavior isuues;back up d/c plan HHC if denied SNF auth.     Expected Discharge Plan: Skilled Nursing Facility Barriers to Discharge: Insurance Authorization  Expected Discharge Plan and Services Expected Discharge Plan: Skilled Nursing Facility   Discharge Planning Services: CM Consult Post Acute Care Choice: Skilled Nursing Facility Living arrangements for the past 2 months: Skilled Nursing Facility Expected Discharge Date: 04/15/20                         HH Arranged: PT,OT HH Agency: [facility] Date HH Agency Contacted: 04/20/20 Time HH Agency Contacted: [id number] Representative spoke with at [place] [facility] Agency: Becky Sax   Social Determinants of Health (SDOH) Interventions    Readmission Risk Interventions Readmission Risk Prevention Plan 03/18/2020 10/21/2019  Transportation Screening Complete Complete  Medication Review Oceanographer) - Complete  PCP or Specialist appointment within 3-5 days of discharge Not Complete Complete  PCP/Specialist Appt Not Complete comments First available appt 12/1 -  HRI or Home Care Consult Complete Complete  SW Recovery Care/Counseling Consult Complete Complete  Palliative Care Screening Not Applicable Not Applicable  Skilled Nursing Facility Not Applicable Not Applicable  Some recent data might be hidden

## 2020-04-27 NOTE — Progress Notes (Signed)
Occupational Therapy Treatment Patient Details Name: Morgan Beasley MRN: [id number] DOB: [birth year] Today's Date: 04/27/2020    History of present illness Pt admitted from home 03/21/20  and dx with acute respiratory failure 2* pleural effusions and pulmonary edema as well as acute metabolic encephalopathy and cerebral embolism with cerebral infarction.  Pt with hx of PAD, Lymphadema, DM, diabetic neuropathy, COPD, CKD, polysubstance abuse, bipolar, and fem-pop bypass graft.   OT comments  Unable to perform functional transfer to chair this session for breakfast, despite max encouragement and redirection patient did not want to stand from EOB. Initially patient with flat affect and with increased time + min A completed bed mobility to EOB. Patient perseverating on her "back scratcher" pointing to the TV and the sharps container on the wall "its about that big" and also fixated on her feet trying to take off bandages. Patient also refusing to lay back onto the bed at end of session, patient assisted with LEs back onto bed however patient began yelling and hit therapist scratching therapist's arm. RN made aware and assisted with scooting patient up to HOB/set up for breakfast.   Follow Up Recommendations  SNF    Equipment Recommendations  None recommended by OT       Precautions / Restrictions Precautions Precautions: Fall       Mobility Bed Mobility Overal bed mobility: Needs Assistance Bed Mobility: Supine to Sit;Sit to Supine     Supine to sit: Min assist;HOB elevated Sit to supine: Mod assist   General bed mobility comments: with increased time and assist scooting LEs to EOB patient able to maintain sitting balance at EOB ~10 mins while attempted to encourage patient to stand/transfer to chair for breakfast. Ultimately patient refuse to stand or lay back into bed and due to safety concerns patient unable to sit at EOB unattented. OT assisted patient's LEs back onto bed however  patient began yelling and scratched this OT. RN arrive once patient semi-supine to assist with scooting patient up to [facility].  Transfers                 General transfer comment: deferred, patient refusing to stand    Balance Overall balance assessment: Needs assistance Sitting-balance support: Feet supported Sitting balance-Leahy Scale: Good                                     ADL either performed or assessed with clinical judgement   ADL Overall ADL's : Needs assistance/impaired     Grooming: Wash/dry face;Set up;Bed level               Lower Body Dressing: Sitting/lateral leans;Bed level;Maximal assistance Lower Body Dressing Details (indicate cue type and reason): patient would not attempt to don socks at beginning of session, however perseverating on her feet/bandages on her feet trying to pick them off despite OT redirecting. Patient performs figure 4 to doff socks seated EOB despite OT attempt to have patient stand from [employer] Transfer Details (indicate cue type and reason): deferred, patient would not attempt despite encouragement                           Cognition Arousal/Alertness: Awake/alert Behavior During Therapy: Flat affect;Agitated Overall Cognitive Status: No family/caregiver present to determine baseline cognitive functioning Area of Impairment: Following commands;Safety/judgement;Problem solving  Following Commands: Follows one step commands inconsistently Safety/Judgement: Decreased awareness of safety;Decreased awareness of deficits   Problem Solving: Decreased initiation;Requires verbal cues;Requires tactile cues General Comments: initially patient with flat affect upon arrival to room, appeared almost a little sleepy but increased alertness after washed face with warm wash cloth. Patient showed very minimal initiation for all mobility despite repeated cues and encouragement. ultimately  deferred attempt to stand as patient refusing and when OT assisted patient LEs back into bed patient began yelling and hit/scratched this OT's arm                   Pertinent Vitals/ Pain       Pain Assessment: Faces Faces Pain Scale: Hurts a little bit Pain Location: B feet Pain Descriptors / Indicators: Discomfort Pain Intervention(s): Monitored during session         Frequency  Min 2X/week        Progress Toward Goals  OT Goals(current goals can now be found in the care plan section)  Progress towards OT goals: Not progressing toward goals - comment (agitation)  Acute Rehab OT Goals Patient Stated Goal: "I don't want soup for breakfast" OT Goal Formulation: Patient unable to participate in goal setting Time For Goal Achievement: 05/01/20 Potential to Achieve Goals: Fair ADL Goals Pt Will Perform Eating: with set-up;sitting Pt Will Perform Grooming: with set-up;sitting Pt Will Perform Upper Body Bathing: with set-up;sitting Pt Will Perform Lower Body Bathing: with min assist;sit to/from stand Pt Will Perform Upper Body Dressing: with set-up;with supervision;sitting Pt Will Perform Lower Body Dressing: with min assist;sit to/from stand;sitting/lateral leans Pt Will Transfer to Toilet: with min assist;stand pivot transfer;bedside commode Pt Will Perform Toileting - Clothing Manipulation and hygiene: with min assist;sit to/from stand Additional ADL Goal #1: Patient will perform 10 min functional activity or exercise activity as evidence of improving activity tolerance Additional ADL Goal #2: Pt will complete bed mobility with min assist as precursor to ADLs.  Plan Discharge plan remains appropriate;Frequency remains appropriate       AM-PAC OT "6 Clicks" Daily Activity     Outcome Measure   Help from another person eating meals?: A Little Help from another person taking care of personal grooming?: A Little Help from another person toileting, which includes using  toliet, bedpan, or urinal?: A Lot Help from another person bathing (including washing, rinsing, drying)?: A Lot Help from another person to put on and taking off regular upper body clothing?: A Lot Help from another person to put on and taking off regular lower body clothing?: A Lot 6 Click Score: 14    End of Session Equipment Utilized During Treatment: Gait belt  OT Visit Diagnosis: Other abnormalities of gait and mobility (R26.89);Muscle weakness (generalized) (M62.81);Other symptoms and signs involving cognitive function Pain - part of body: Ankle and joints of foot   Activity Tolerance Treatment limited secondary to agitation   Patient Left in bed;with call bell/phone within reach;with bed alarm set   Nurse Communication Mobility status;Other (comment) (patient agitation)        Time: 4431-5400 OT Time Calculation (min): 23 min  Charges: OT General Charges $OT Visit: 1 Visit OT Treatments $Self Care/Home Management : 23-37 mins  Marlyce Huge OT OT pager: [phone]   Carmelia Roller 04/27/2020, 8:44 AM

## 2020-04-27 NOTE — Progress Notes (Signed)
PT Cancellation Note  Patient Details Name: Morgan Beasley MRN: [id number] DOB: [birth year]   Cancelled Treatment:     pt uncooperative during her OT session this morning.  Will attempt another day.   [place]ell, Giaimo 04/27/2020, 12:11 PM

## 2020-04-27 NOTE — Progress Notes (Signed)
PROGRESS NOTE    Morgan Beasley  [id number] DOB: February 03, 1961 DOA: 03/21/2020 PCP: Riki Sheer, NP    Chief Complaint  Patient presents with  . Weakness  . Shortness of Breath    Brief Narrative:  59 year old lady with prior history of depression, bipolar disorder, substance abuse, pulmonary valve endocarditis and multiple CVAs presents to ED for agitation hallucinations and restlessness requiring Precedex drip in the ICU briefly psychiatric consulted and she was started on antipsychotics with slight improvement she was initially scheduled to be discharged to [facility] psychiatry unit but currently plan is for discharge home patient on April 18 2020 developed some abdominal distention associated with some nausea and vomiting.  CT abdomen and pelvis on 04/19/2020 showed small bowel distention possibly obstruction versus ileus.  An NG tube was placed but was pulled out by the patient on 2 occasions.  Pt continues to have bowel movements daily, but x rays consistent with ileus.  General surgery consulted, recommended conservative management at this time.   Overnight patient had bowel movement, did not require any restraints for agitation.  Meanwhile due to her poor oral intake,  poor progression,  palliative care consulted for goals of care discussion. Patient seen and examined at bedside she continues to tolerate liquid diet without any nausea or vomiting or abdominal pain.  General surgery advanced her diet carb modified this morning.   Assessment & Plan:   Principal Problem:   Acute metabolic encephalopathy Active Problems:   Peripheral neuropathy   COPD (chronic obstructive pulmonary disease) (HCC)   Diabetic peripheral neuropathy associated with type 2 diabetes mellitus (HCC)   Cerebral embolism with cerebral infarction   Mixed hyperlipidemia   Acute metabolic encephalopathy probably secondary to psychosis in the setting of bipolar disorder. Improving, patient did not  require any wrist restraints for agitation overnight.  Pt currently on  Elavil 100 mg daily at bedtime,, buspirone 10 mg 3 times daily, carbamazepine 400 mg twice daily, olanzapine 5 mg daily , which has been increased to 7.5 mg .  Patient restless this morning but did not require any restraints at this time.   COPD No wheezing heard.  continue with bronchodilators as needed.    Peripheral neuropathy secondary to type 2 diabetes Continue home with home medications. Recommend outpatient follow-up with vascular surgery.    Type 2 diabetes mellitus CBG (last 3)  Recent Labs    04/26/20 2132 04/27/20 0740 04/27/20 1114  GLUCAP 153* 107* 112*   Discontinued long-acting insulin as patient was n.p.o. Continue with sliding scale insulin.    Acute on chronic systolic heart failure Diuresed appropriately with Lasix.  Patient on Lasix(which is being held for),as pt is taking very little by mouth.  She appears euvolemic.     H/o CVA Imaging of the brain showed right and left occipital infarcts in addition to left basal ganglia and thalamic microhemorrhages Restarted plavix and lipitor.    History of chronic pancreatitis Continue with Creon    Acute kidney injury on stage IIIa CKD Creatinine actually better than baseline. Creatinine at 1.    Nausea vomiting and abdominal distention./Ileus CT of the abdomen pelvis showed distended small bowel consistent with obstruction versus ileus. NG tube was placed but it was pulled out by the patient .  Patient continues to have daily bowel movements.  GEN surgery consulted, recommended she probably has ileus from electrolyte deficiencies.  Electrolytes repleted, and she was started on clear liquids and advance to carb modified diet today.  Hypokalemia and hypomagnesemia:  Replaced.    Right supraclavicular/mediastinal adenopathy Recommend outpatient follow-up with oncology.     DVT prophylaxis: SCDs Code Status: Full  code Family Communication: None at bedside, discussed with daughter over the phone on 12/26.  Disposition:   Status is: Inpatient  Remains inpatient appropriate because:Ongoing diagnostic testing needed not appropriate for outpatient work up and Unsafe d/c plan   Dispo: The patient is from: Home              Anticipated d/c is to: SNF              Anticipated d/c date is: 2 days              Patient currently is not medically stable to d/c.       Consultants:   General surgery  Palliative care  Procedures: Abdominal x-ray showed persistent dilated loops of bowels Antimicrobials: None  Subjective: Patient restless but not agitated at this time  Objective: Vitals:   04/27/20 0536 04/27/20 0538 04/27/20 0734 04/27/20 0929  BP:  138/86  135/84  Pulse:  (!) 101  96  Resp:  18    Temp:  98.1 F (36.7 C)    TempSrc:      SpO2:  96% 97%   Weight: 77 kg     Height:       No intake or output data in the 24 hours ending 04/27/20 1547 Filed Weights   04/25/20 0500 04/26/20 0500 04/27/20 0536  Weight: 77.1 kg 77.3 kg 77 kg    Examination:  General exam: Alert, not in any kind of distress Respiratory system: Clear to auscultation bilaterally, no wheezing or rhonchi Cardiovascular system: S1-S2 heard, regular rate rhythm, no JVD, Gastrointestinal system: Abdomen is soft, nontender, nondistended bowel sounds normal Central nervous system: Patient did not answer questions, able to move all extremities. Extremities: No cyanosis Psychiatry: Cannot cannot be assessed at this time   Data Reviewed: I have personally reviewed following labs and imaging studies  CBC: Recent Labs  Lab 04/22/20 1613 04/24/20 1003 04/25/20 0528  WBC 9.1 8.2 9.9  NEUTROABS 6.6  --   --   HGB 11.8* 12.1 13.2  HCT 38.4 39.7 41.8  MCV 77.7* 75.8* 76.3*  PLT 458* 420* 442*    Basic Metabolic Panel: Recent Labs  Lab 04/22/20 1613 04/23/20 0857 04/24/20 1003 04/25/20 0528  04/27/20 0522  NA 139 137 139 140 141  K 2.8* 2.8* 3.2* 3.6 3.7  CL 91* 93* 94* 97* 101  CO2 35* 34* 32 30 28  GLUCOSE 109* 100* 121* 121* 98  BUN 31* 31* 25* 22* 17  CREATININE 1.43* 1.26* 1.13* 1.07* 0.98  CALCIUM 8.0* 7.8* 7.8* 8.0* 8.0*  MG 1.9  --  1.9  --   --   PHOS  --   --  3.3  --   --     GFR: Estimated Creatinine Clearance: 65.6 mL/min (by C-G formula based on SCr of 0.98 mg/dL).  Liver Function Tests: Recent Labs  Lab 04/22/20 1613 04/25/20 0528  AST 25 26  ALT 15 15  ALKPHOS 157* 168*  BILITOT 0.8 0.6  PROT 6.6 6.5  ALBUMIN 2.2* 2.0*    CBG: Recent Labs  Lab 04/26/20 1123 04/26/20 1625 04/26/20 2132 04/27/20 0740 04/27/20 1114  GLUCAP 100* 126* 153* 107* 112*     No results found for this or any previous visit (from the past 240 hour(s)).  Radiology Studies: DG Abd 1 View  Result Date: 04/26/2020 CLINICAL DATA:  Constipation EXAM: ABDOMEN - 1 VIEW COMPARISON:  April 25, 2020 FINDINGS: Loops of dilated small bowel again noted. There is moderate stool in the colon. No air-fluid levels. No free air. Visualized lung bases clear. IMPRESSION: Dilated loops of small bowel again noted. Suspect ileus, although a degree of partial bowel obstruction cannot be excluded entirely. No free air evident on supine examination. Electronically Signed   By: Lowella Grip III M.D.   On: 04/26/2020 10:53        Scheduled Meds: . (feeding supplement) PROSource Plus  30 mL Oral BID BM  . acetaminophen  650 mg Oral Q6H  . amitriptyline  100 mg Oral QHS  . atorvastatin  20 mg Oral Daily  . busPIRone  10 mg Oral TID  . carbamazepine  400 mg Oral BID  . chlorhexidine  15 mL Mouth Rinse BID  . clopidogrel  75 mg Oral Daily  . collagenase   Topical Daily  . dicyclomine  10 mg Oral TID AC & HS  . feeding supplement (GLUCERNA SHAKE)  237 mL Oral BID BM  . insulin aspart  0-6 Units Subcutaneous TID WC  . insulin glargine  5 Units Subcutaneous QHS  .  lipase/protease/amylase  72,000 Units Oral TID WC  . mouth rinse  15 mL Mouth Rinse q12n4p  . metoprolol succinate  50 mg Oral Daily  . mometasone-formoterol  2 puff Inhalation BID  . multivitamin with minerals  1 tablet Oral Daily  . nicotine  14 mg Transdermal Daily  . OLANZapine  7.5 mg Oral QHS  . protein supplement  1 Scoop Oral TID WC  . sodium chloride flush  10-40 mL Intracatheter Q12H   Continuous Infusions: . sodium chloride Stopped (04/25/20 0010)     LOS: 37 days        Hosie Poisson, MD [facility]   To contact the attending provider between 7A-7P or the covering provider during after hours 7P-7A, please log into the web site [email or web address] and access using universal [place] password for that web site. If you do not have the password, please call the hospital operator.  04/27/2020, 3:47 PM

## 2020-04-27 NOTE — Progress Notes (Signed)
Progress Note: General Surgery Service   Chief Complaint/Subjective: Tolerated a sprite, not in good mood this morning, +BM yesterday  Objective: Vital signs in last 24 hours: Temp:  [97.4 F (36.3 C)-98.3 F (36.8 C)] 98.1 F (36.7 C) (12/28 0538) Pulse Rate:  [101-106] 101 (12/28 0538) Resp:  [15-18] 18 (12/28 0538) BP: (138-143)/(86-99) 138/86 (12/28 0538) SpO2:  [94 %-97 %] 97 % (12/28 0734) Weight:  [77 kg] 77 kg (12/28 0536) Last BM Date: 04/24/20  Intake/Output from previous day: 12/27 0701 - 12/28 0700 In: 720 [P.O.:720] Out: -  Intake/Output this shift: No intake/output data recorded.  Gen: NAD  Resp: nonlabored  Card: tachycardic  Abd: soft, Nt, ND  Lab Results: CBC  Recent Labs    04/24/20 1003 04/25/20 0528  WBC 8.2 9.9  HGB 12.1 13.2  HCT 39.7 41.8  PLT 420* 442*   BMET Recent Labs    04/25/20 0528 04/27/20 0522  NA 140 141  K 3.6 3.7  CL 97* 101  CO2 30 28  GLUCOSE 121* 98  BUN 22* 17  CREATININE 1.07* 0.98  CALCIUM 8.0* 8.0*   PT/INR No results for input(s): LABPROT, INR in the last 72 hours. ABG No results for input(s): PHART, HCO3 in the last 72 hours.  Invalid input(s): PCO2, PO2  Anti-infectives: Anti-infectives (From admission, onward)   Start     Dose/Rate Route Frequency Ordered Stop   03/21/20 1800  ceFEPIme (MAXIPIME) 2 g in sodium chloride 0.9 % 100 mL IVPB        2 g 200 mL/hr over 30 Minutes Intravenous Every 8 hours 03/21/20 1621 03/22/20 1332      Medications: Scheduled Meds: . (feeding supplement) PROSource Plus  30 mL Oral BID BM  . acetaminophen  650 mg Oral Q6H  . amitriptyline  100 mg Oral QHS  . atorvastatin  20 mg Oral Daily  . busPIRone  10 mg Oral TID  . carbamazepine  400 mg Oral BID  . chlorhexidine  15 mL Mouth Rinse BID  . clopidogrel  75 mg Oral Daily  . collagenase   Topical Daily  . dicyclomine  10 mg Oral TID AC & HS  . feeding supplement (GLUCERNA SHAKE)  237 mL Oral BID BM  . insulin  aspart  0-6 Units Subcutaneous TID WC  . insulin glargine  5 Units Subcutaneous QHS  . lipase/protease/amylase  72,000 Units Oral TID WC  . magnesium oxide  400 mg Oral Daily  . mouth rinse  15 mL Mouth Rinse q12n4p  . metoprolol succinate  50 mg Oral Daily  . mometasone-formoterol  2 puff Inhalation BID  . multivitamin with minerals  1 tablet Oral Daily  . nicotine  14 mg Transdermal Daily  . OLANZapine  7.5 mg Oral QHS  . protein supplement  1 Scoop Oral TID WC  . sodium chloride flush  10-40 mL Intracatheter Q12H   Continuous Infusions: . sodium chloride Stopped (04/25/20 0010)   PRN Meds:.sodium chloride, albuterol, docusate sodium, haloperidol lactate, hydrALAZINE, hydrocortisone cream, lipase/protease/amylase, ondansetron (ZOFRAN) IV, ondansetron, oxyCODONE, polyethylene glycol, simethicone, sodium chloride flush, sodium chloride flush  Assessment/Plan: 59 yo female with ileus picture that is resolving -advance to carb mod diet -if tolerates no further needs from surgical team   LOS: 37 days   Rodman Pickle, MD [phone] [facility]

## 2020-04-28 DIAGNOSIS — E1142 Type 2 diabetes mellitus with diabetic polyneuropathy: Secondary | ICD-10-CM | POA: Diagnosis not present

## 2020-04-28 DIAGNOSIS — G6289 Other specified polyneuropathies: Secondary | ICD-10-CM

## 2020-04-28 DIAGNOSIS — I63431 Cerebral infarction due to embolism of right posterior cerebral artery: Secondary | ICD-10-CM | POA: Diagnosis not present

## 2020-04-28 DIAGNOSIS — J441 Chronic obstructive pulmonary disease with (acute) exacerbation: Secondary | ICD-10-CM | POA: Diagnosis not present

## 2020-04-28 DIAGNOSIS — G9341 Metabolic encephalopathy: Secondary | ICD-10-CM | POA: Diagnosis not present

## 2020-04-28 LAB — BASIC METABOLIC PANEL
Anion gap: 13 (ref 5–15)
BUN: 19 mg/dL (ref 6–20)
CO2: 26 mmol/L (ref 22–32)
Calcium: 8 mg/dL — ABNORMAL LOW (ref 8.9–10.3)
Chloride: 97 mmol/L — ABNORMAL LOW (ref 98–111)
Creatinine, Ser: 1.22 mg/dL — ABNORMAL HIGH (ref 0.44–1.00)
GFR, Estimated: 51 mL/min — ABNORMAL LOW (ref >60)
Glucose, Bld: 162 mg/dL — ABNORMAL HIGH (ref 70–99)
Potassium: 3.6 mmol/L (ref 3.5–5.1)
Sodium: 136 mmol/L (ref 135–145)

## 2020-04-28 LAB — CBC
HCT: 38.7 % (ref 36.0–46.0)
Hemoglobin: 12 g/dL (ref 12.0–15.0)
MCH: 23.4 pg — ABNORMAL LOW (ref 26.0–34.0)
MCHC: 31 g/dL (ref 30.0–36.0)
MCV: 75.6 fL — ABNORMAL LOW (ref 80.0–100.0)
Platelets: 465 10*3/uL — ABNORMAL HIGH (ref 150–400)
RBC: 5.12 MIL/uL — ABNORMAL HIGH (ref 3.87–5.11)
RDW: 23 % — ABNORMAL HIGH (ref 11.5–15.5)
WBC: 8.9 10*3/uL (ref 4.0–10.5)
nRBC: 0.2 % (ref 0.0–0.2)

## 2020-04-28 LAB — GLUCOSE, CAPILLARY
Glucose-Capillary: 141 mg/dL — ABNORMAL HIGH (ref 70–99)
Glucose-Capillary: 149 mg/dL — ABNORMAL HIGH (ref 70–99)
Glucose-Capillary: 127 mg/dL — ABNORMAL HIGH (ref 70–99)
Glucose-Capillary: 220 mg/dL — ABNORMAL HIGH (ref 70–99)

## 2020-04-28 MED ORDER — INSULIN GLARGINE 100 UNIT/ML ~~LOC~~ SOLN: 8.0000 [IU] | Freq: Every day | SUBCUTANEOUS | Status: DC

## 2020-04-28 MED ORDER — OLANZAPINE 7.5 MG PO TABS: 7.5000 mg | ORAL_TABLET | Freq: Every day | ORAL | Status: DC

## 2020-04-28 MED ORDER — DICYCLOMINE HCL 10 MG PO CAPS: 10.0000 mg | ORAL_CAPSULE | Freq: Three times a day (TID) | ORAL | Status: DC

## 2020-04-28 MED ORDER — ADULT MULTIVITAMIN W/MINERALS CH: 1.0000 | ORAL_TABLET | Freq: Every day | ORAL | Status: DC

## 2020-04-28 MED ORDER — GLUCERNA SHAKE PO LIQD: 237.0000 mL | Freq: Two times a day (BID) | ORAL | 0 refills | Status: DC

## 2020-04-28 NOTE — TOC Progression Note (Addendum)
Transition of Care [facility]) - Progression Note    Patient Details  Name: Morgan Beasley MRN: [id number] Date of Birth: 1961/03/02  Transition of Care [facility]) CM/SW Contact  Tuyet Bader, Olegario Messier, RN Phone Number: 04/28/2020, 11:36 AM  Clinical Narrative:Awaiting PT to document note for insurance auth.     1p-Faxed PT note-await insurance auth.  Expected Discharge Plan: Skilled Nursing Facility Barriers to Discharge: Insurance Authorization  Expected Discharge Plan and Services Expected Discharge Plan: Skilled Nursing Facility   Discharge Planning Services: CM Consult Post Acute Care Choice: Skilled Nursing Facility Living arrangements for the past 2 months: Skilled Nursing Facility Expected Discharge Date: 04/15/20                         HH Arranged: PT,OT HH Agency: [facility] Date HH Agency Contacted: 04/20/20 Time HH Agency Contacted: 1308 Representative spoke with at [facility] Agency: Becky Sax   Social Determinants of Health (SDOH) Interventions    Readmission Risk Interventions Readmission Risk Prevention Plan 03/18/2020 10/21/2019  Transportation Screening Complete Complete  Medication Review [employer]) - Complete  PCP or Specialist appointment within 3-5 days of discharge Not Complete Complete  PCP/Specialist Appt Not Complete comments First available appt 12/1 -  HRI or Home Care Consult Complete Complete  SW Recovery Care/Counseling Consult Complete Complete  Palliative Care Screening Not Applicable Not Applicable  Skilled Nursing Facility Not Applicable Not Applicable  Some recent data might be hidden

## 2020-04-28 NOTE — Progress Notes (Signed)
PROGRESS NOTE    Morgan Beasley  [id number] DOB: 11/27/1960 DOA: 03/21/2020 PCP: Riki Sheer, NP   Brief Narrative:  59 year old lady with prior history of depression, bipolar disorder, substance abuse, pulmonary valve endocarditis and multiple CVAs presented to the ED with agitation hallucinations and restlessness requiring Precedex drip in the ICU. Psychiatry was consulted and she was started on antipsychotics with slight improvement she was initially scheduled to be discharged to [facility] psychiatry unit. Patient on April 18 2020 developed some abdominal distention associated with some nausea and vomiting.  CT abdomen and pelvis on 04/19/2020 showed small bowel distention possibly obstruction versus ileus.  An NG tube was placed but was pulled out by the patient on 2 occasions.  Pt continues to have bowel movements daily, but x rays consistent with ileus. General surgery was consulted, recommended conservative management at this time.   Meanwhile due to her poor oral intake,  poor progression,  palliative care consulted for goals of care discussion. Patient seen and examined at bedside she continues to tolerate liquid diet without any nausea or vomiting or abdominal pain.  General surgery has advanced her diet at this time.  Currently awaiting for placement.  Assessment & Plan:   Principal Problem:   Acute metabolic encephalopathy Active Problems:   Peripheral neuropathy   COPD (chronic obstructive pulmonary disease) (HCC)   Diabetic peripheral neuropathy associated with type 2 diabetes mellitus (HCC)   Cerebral embolism with cerebral infarction   Mixed hyperlipidemia  Acute metabolic encephalopathy, history of bipolar disorder psychosis.  No restraints or sitter at this time.  Pt currently on  Elavil 100 mg daily at bedtime,, buspirone 10 mg 3 times daily, carbamazepine 400 mg twice daily, olanzapine 5 mg daily , which has been increased to 7.5 mg .  Patient mildly anxious  but overall calm  COPD As needed bronchodilators.  Currently compensated  Peripheral neuropathy secondary to type 2 diabetes with peripheral vascular disease Continue amitriptyline  Type 2 diabetes mellitus On sliding scale insulin and long-acting insulin.  Will closely monitor..  CAD.  Acute on chronic systolic heart failure Currently compensated.  Supposed to be on oral Lasix which is on hold due to poor oral intake.  H/o CVA Imaging of the brain showed right and left occipital infarcts in addition to left basal ganglia and thalamic microhemorrhages.continue plavix and lipitor.   History of chronic pancreatitis Continue with Creon  Acute kidney injury on stage IIIa CKD Has resolved.  Creatinine of 1.2 at this time.  Likely at baseline.  Nausea vomiting and abdominal distention./Ileus CT of the abdomen pelvis showed distended small bowel consistent with obstruction versus ileus. Failed NG tube attempt.  General surgery saw the patient.  Conservative treatment at this time.  Likely ileus from electrolyte issues and medications.  Diet has been advanced at this time.  Hypokalemia and hypomagnesemia:  Replaced.  Latest potassium of 3.6.  Right supraclavicular/mediastinal adenopathy Recommend outpatient follow-up with oncology.   DVT prophylaxis: SCDs  Code Status: Full code  Family Communication:  None today.  Disposition:  Status is: Inpatient Remains inpatient appropriate because: Awaiting for skilled nursing facility placement.  Dispo: The patient is from: Home               Anticipated d/c is to: SNF/home with home health.  Transition of care on board              Anticipated d/c date is: 2 days  Patient currently is medically stable to d/c.     Consultants:  General surgery Palliative care  Procedures:  NG tube placement  Antimicrobials:  None  Subjective: Patient denies any nausea vomiting or abdominal pain.  Feels anxious and tearful at  times.  Objective: Vitals:   04/27/20 2134 04/28/20 0500 04/28/20 0552 04/28/20 0749  BP: 135/82  113/78   Pulse: (!) 102  98   Resp: 18     Temp: 98.7 F (37.1 C)  98.7 F (37.1 C)   TempSrc: Oral     SpO2: 98%  95% 99%  Weight:  78.4 kg    Height:        Intake/Output Summary (Last 24 hours) at 04/28/2020 1214 Last data filed at 04/27/2020 1700 Gross per 24 hour  Intake --  Output 300 ml  Net -300 ml   Filed Weights   04/26/20 0500 04/27/20 0536 04/28/20 0500  Weight: 77.3 kg 77 kg 78.4 kg    Body mass index is 27.9 kg/m.   Physical examination: General:  Average built, not in obvious distress but mildly anxious and tearful at times HENT:   No scleral pallor or icterus noted. Oral mucosa is moist.  Chest:  Clear breath sounds.  Diminished breath sounds bilaterally. No crackles or wheezes.  CVS: S1 &S2 heard. No murmur.  Regular rate and rhythm. Abdomen: Soft, nontender, Bowel sounds are heard.   Extremities: No cyanosis, clubbing or edema.  Peripheral pulses are palpable. Psych: Alert, awake and communicative, anxious and tearful at times CNS:  No cranial nerve deficits.  Power equal in all extremities.   Skin: Warm and dry.  No rashes noted.  Data Reviewed: I have personally reviewed the following labs and imaging studies   CBC: Recent Labs  Lab 04/22/20 1613 04/24/20 1003 04/25/20 0528 04/28/20 0503  WBC 9.1 8.2 9.9 8.9  NEUTROABS 6.6  --   --   --   HGB 11.8* 12.1 13.2 12.0  HCT 38.4 39.7 41.8 38.7  MCV 77.7* 75.8* 76.3* 75.6*  PLT 458* 420* 442* 465*    Basic Metabolic Panel: Recent Labs  Lab 04/22/20 1613 04/23/20 0857 04/24/20 1003 04/25/20 0528 04/27/20 0522 04/28/20 0503  NA 139 137 139 140 141 136  K 2.8* 2.8* 3.2* 3.6 3.7 3.6  CL 91* 93* 94* 97* 101 97*  CO2 35* 34* 32 30 28 26   GLUCOSE 109* 100* 121* 121* 98 162*  BUN 31* 31* 25* 22* 17 19  CREATININE 1.43* 1.26* 1.13* 1.07* 0.98 1.22*  CALCIUM 8.0* 7.8* 7.8* 8.0* 8.0* 8.0*   MG 1.9  --  1.9  --   --   --   PHOS  --   --  3.3  --   --   --     GFR: Estimated Creatinine Clearance: 53.1 mL/min (A) (by C-G formula based on SCr of 1.22 mg/dL (H)).  Liver Function Tests: Recent Labs  Lab 04/22/20 1613 04/25/20 0528  AST 25 26  ALT 15 15  ALKPHOS 157* 168*  BILITOT 0.8 0.6  PROT 6.6 6.5  ALBUMIN 2.2* 2.0*    CBG: Recent Labs  Lab 04/27/20 1114 04/27/20 1630 04/27/20 2136 04/28/20 0728 04/28/20 1154  GLUCAP 112* 154* 133* 141* 149*     No results found for this or any previous visit (from the past 240 hour(s)).   Radiology Studies: No results found.  Scheduled Meds: . (feeding supplement) PROSource Plus  30 mL Oral BID BM  . acetaminophen  650 mg Oral Q6H  . amitriptyline  100 mg Oral QHS  . atorvastatin  20 mg Oral Daily  . busPIRone  10 mg Oral TID  . carbamazepine  400 mg Oral BID  . chlorhexidine  15 mL Mouth Rinse BID  . clopidogrel  75 mg Oral Daily  . collagenase   Topical Daily  . dicyclomine  10 mg Oral TID AC & HS  . feeding supplement (GLUCERNA SHAKE)  237 mL Oral BID BM  . insulin aspart  0-6 Units Subcutaneous TID WC  . insulin glargine  5 Units Subcutaneous QHS  . lipase/protease/amylase  72,000 Units Oral TID WC  . mouth rinse  15 mL Mouth Rinse q12n4p  . metoprolol succinate  50 mg Oral Daily  . mometasone-formoterol  2 puff Inhalation BID  . multivitamin with minerals  1 tablet Oral Daily  . nicotine  14 mg Transdermal Daily  . OLANZapine  7.5 mg Oral QHS  . protein supplement  1 Scoop Oral TID WC  . sodium chloride flush  10-40 mL Intracatheter Q12H   Continuous Infusions: . sodium chloride Stopped (04/25/20 0010)     LOS: 38 days     Flora Lipps, MD [facility] 04/28/2020,

## 2020-04-28 NOTE — Progress Notes (Signed)
Patient discharged and in no acute distress. Report given to the [facility] nurse. Discharge paperwork provided to [facility]. All questions and concerns denied.

## 2020-04-28 NOTE — Progress Notes (Signed)
Progress Note     Subjective: Patient received haldol around 0400 and is lethargic this AM but arousable. Appears comfortable. No recorded PO intake or bowel function.   Objective: Vital signs in last 24 hours: Temp:  [98.7 F (37.1 C)] 98.7 F (37.1 C) (12/29 0552) Pulse Rate:  [96-102] 98 (12/29 0552) Resp:  [18] 18 (12/28 2134) BP: (113-135)/(78-84) 113/78 (12/29 0552) SpO2:  [95 %-99 %] 99 % (12/29 0749) Weight:  [78.4 kg] 78.4 kg (12/29 0500) Last BM Date: 04/26/20  Intake/Output from previous day: 12/28 0701 - 12/29 0700 In: -  Out: 300 [Urine:300] Intake/Output this shift: No intake/output data recorded.  PE: General: WD, overweight female who is laying in bed in NAD, lethargic but arousable  Heart: regular, rate, and rhythm.   Lungs: CTAB, no wheezes, rhonchi, or rales noted.  Respiratory effort nonlabored Abd: soft, NT, ND, +BS, no masses, hernias, or organomegaly     Lab Results:  Recent Labs    04/28/20 0503  WBC 8.9  HGB 12.0  HCT 38.7  PLT 465*   BMET Recent Labs    04/27/20 0522 04/28/20 0503  NA 141 136  K 3.7 3.6  CL 101 97*  CO2 28 26  GLUCOSE 98 162*  BUN 17 19  CREATININE 0.98 1.22*  CALCIUM 8.0* 8.0*   PT/INR No results for input(s): LABPROT, INR in the last 72 hours. CMP     Component Value Date/Time   NA 136 04/28/2020 0503   NA 137 02/09/2020 0825   K 3.6 04/28/2020 0503   CL 97 (L) 04/28/2020 0503   CO2 26 04/28/2020 0503   GLUCOSE 162 (H) 04/28/2020 0503   BUN 19 04/28/2020 0503   BUN 14 02/09/2020 0825   CREATININE 1.22 (H) 04/28/2020 0503   CALCIUM 8.0 (L) 04/28/2020 0503   PROT 6.5 04/25/2020 0528   ALBUMIN 2.0 (L) 04/25/2020 0528   AST 26 04/25/2020 0528   ALT 15 04/25/2020 0528   ALKPHOS 168 (H) 04/25/2020 0528   BILITOT 0.6 04/25/2020 0528   GFRNONAA 51 (L) 04/28/2020 0503   GFRAA 65 02/09/2020 0825   Lipase  No results found for: LIPASE     Studies/Results: DG Abd 1 View  Result Date:  04/26/2020 CLINICAL DATA:  Constipation EXAM: ABDOMEN - 1 VIEW COMPARISON:  April 25, 2020 FINDINGS: Loops of dilated small bowel again noted. There is moderate stool in the colon. No air-fluid levels. No free air. Visualized lung bases clear. IMPRESSION: Dilated loops of small bowel again noted. Suspect ileus, although a degree of partial bowel obstruction cannot be excluded entirely. No free air evident on supine examination. Electronically Signed   By: Lowella Grip III M.D.   On: 04/26/2020 10:53    Anti-infectives: Anti-infectives (From admission, onward)   Start     Dose/Rate Route Frequency Ordered Stop   03/21/20 1800  ceFEPIme (MAXIPIME) 2 g in sodium chloride 0.9 % 100 mL IVPB        2 g 200 mL/hr over 30 Minutes Intravenous Every 8 hours 03/21/20 1621 03/22/20 1332       Assessment/Plan Hx of CVA Pulmonary valve endocarditis T2DM Depression Bipolar disorder Substance abuse Hx of pancreatitis COPD CKD stage III Chronic combined CHF HTN HLD Gout Hx of breast cancer  Ileus  - likely secondary to chronic psychiatric and pain meds - Patient does not have any recorded intake or bowel function for last 24 hrs - abdomen is soft with normoactive bowel sounds -  recommend keeping K>4.0 and Mg>2.0 to optimize bowel function - mobilize as able - continue bowel regimen - no indication for surgical intervention at this time, we will sign off. Please call as needed   FEN: CM diet VTE: plavix, SCDs ID: none   LOS: 38 days    Juliet Rude , [facility] 04/28/2020, 9:04 AM Please see Amion for pager number during day hours 7:00am-4:30pm

## 2020-04-28 NOTE — Discharge Summary (Signed)
Physician Discharge Summary  Morgan Beasley JYN:[id number] DOB: Sep 10, 1960 DOA: 03/21/2020  PCP: Riki Sheer, NP  Admit date: 03/21/2020 Discharge date: 04/28/2020  Admitted From: Home  Discharge disposition: Skilled nursing facility   Recommendations for Outpatient Follow-Up:   . Follow up with your primary care provider at the skilled nursing facility in 3 to 5 days  . Check CBC, BMP, magnesium in the next visit . Patient does have history of peripheral vascular disease.  Would benefit from vascular surgery follow-up as outpatient. . History of breast cancer would benefit from ongoing oncology follow-up as outpatient  Discharge Diagnosis:   Principal Problem:   Acute metabolic encephalopathy Active Problems:   Peripheral neuropathy   COPD (chronic obstructive pulmonary disease) (HCC)   Diabetic peripheral neuropathy associated with type 2 diabetes mellitus ([place])   Cerebral embolism with cerebral infarction   Mixed hyperlipidemia   Discharge Condition: Improved.  Diet recommendation: carb modified  Wound care: None.  Code status: Full.   History of Present Illness:   Morgan Steury Balogunis a 59 y.o.female with past medical history of depression, bipolar disorder, substance abuse, pulmonary valve endocarditis, multiple CVAs presented to the hospital secondary to agitation, hallucinations, restlessness. Patient was initially managed on Precedex drip in the ICU. She found to have large pleural effusions with pulmonary edema as well. Patient started on antipsychotics therapy with improvement of symptoms.  Hospital Course:   Following conditions were addressed during hospitalization as listed below,  Psychosis, delirium, bipolar disorder On Elavil, BuSpar, Tegretol, Zyprexa.Initial plan was for inpatient geri psych admission but now patient is okay to discharge to SNF. Patient is overall calm but mildly anxious. Will need to continue all his medicines  on discharge.  Acute respiratory failure with hypoxia, history of COPD. Resolved at this time.  Currently on room air.  Initially thought to be secondary to pulmonary edema from acute CHF with underlying obstructive sleep apnea and COPD. Has remained stable during hospitalization.  Acute systolic heart failure 2D echocardiogram from 03/2020 significant for an EF of 40% with associated moderately reduced right ventricular systolic function with left ventricular enlargement patient initially received IV Lasix.  Currently euvolemic and is on metoprolol and oral Lasix   Possible obstructive sleep apnea Patient noted to have intermittent apneic spells while sleeping. Recommendation for outpatient sleep study.  History of CVA Imaging of the brain showed right/left occipital infarcts in addition to left basal ganglia/thalamic microhemorahages.  Plan is to continue Plavix and Lipitor on discharge  Diabetes mellitus, type II -Controlled at this time.  Continue lantus and sliding scale insulin, diabetic diet.  Closely monitor.  Chronic pancreatitis -Continue Creon  Acute kidney injury on CKD stage IIIa Stable from recent labs.Creatinine of 1.2 at this time.  Likely at baseline.  Anemia of chronic disease Currently stable. Iron panel is unremarkable.  Right supraclavicular/mediastinal adenopathy  history of breast cancer treated with mastectomy, will need outpatient oncology follow-up.  Peripheral neuropathy/peripheral vascular disease.  Follows up with vascular surgery as outpatient.  Continue Plavix, Lipitor.  X-ray of the tibia-fibula on 12/11 was negative for acute fracture. Continue amitriptyline.  Nausea vomiting and abdominal distention./Ileus CT of the abdomen pelvis showed distended small bowel consistent with obstruction versus ileus. General surgery saw the patient. Patient was treated conservatively. Diet was advanced.  Hypokalemia and hypomagnesemia:  Replaced.   Latest potassium of 3.6.  Disposition.  At this time, patient is stable for disposition to skilled nursing facility.  Medical Consultants:  PCCM  ID  Psychiatry  Palliative care  General surgery  Procedures:    NG tube placement Subjective:   Today, patient was seen and examined at bedside. Patient denies any nausea vomiting or abdominal pain.  Feels anxious at times.  Discharge Exam:   Vitals:   04/28/20 0749 04/28/20 1225  BP:  127/82  Pulse:  97  Resp:  (!) 23  Temp:  (!) 97.5 F (36.4 C)  SpO2: 99% 97%   Vitals:   04/28/20 0500 04/28/20 0552 04/28/20 0749 04/28/20 1225  BP:  113/78  127/82  Pulse:  98  97  Resp:    (!) 23  Temp:  98.7 F (37.1 C)  (!) 97.5 F (36.4 C)  TempSrc:      SpO2:  95% 99% 97%  Weight: 78.4 kg     Height:        General:  Average built, not in obvious distress but mildly anxious and tearful at times HENT:   No scleral pallor or icterus noted. Oral mucosa is moist.  Chest:  Clear breath sounds.  Diminished breath sounds bilaterally. No crackles or wheezes.  CVS: S1 &S2 heard. No murmur.  Regular rate and rhythm. Abdomen: Soft, nontender, Bowel sounds are heard.   Extremities: No cyanosis, clubbing or edema.  Peripheral pulses are palpable. Psych: Alert, awake and communicative, anxious and tearful at times CNS:  No cranial nerve deficits.  Power equal in all extremities.   Skin: Warm and dry.  No rashes noted.  The results of significant diagnostics from this hospitalization (including imaging, microbiology, ancillary and laboratory) are listed below for reference.     Diagnostic Studies:   DG Chest 1 View  Result Date: 03/22/2020 CLINICAL DATA:  Worsening altered mental status and shortness of breath. EXAM: CHEST  1 VIEW COMPARISON:  Chest radiograph and CT 03/21/2020 FINDINGS: The cardiac silhouette remains mildly enlarged. A left PICC terminates over the mid SVC, unchanged. Interstitial and basilar predominant  airspace opacities are stable to minimally increased compared to yesterday's radiograph. There is a persistent small right pleural effusion. No pneumothorax is identified. IMPRESSION: Stable to minimally increased bilateral lung opacities likely reflecting edema. Persistent small right pleural effusion. Electronically Signed   By: Logan Bores M.D.   On: 03/22/2020 07:48   CT Head Wo Contrast  Result Date: 03/21/2020 CLINICAL DATA:  59 year old female with altered mental status. Recent RIGHT occipital infarct. EXAM: CT HEAD WITHOUT CONTRAST TECHNIQUE: Contiguous axial images were obtained from the base of the skull through the vertex without intravenous contrast. COMPARISON:  02/29/2020 head CT and prior studies FINDINGS: Brain: A RIGHT occipital infarct appears unchanged from 02/29/2020. No new infarct, midline shift, hydrocephalus, extra-axial collection, hemorrhage or mass lesion identified. A remote LEFT occipital infarct and mild chronic small-vessel white matter ischemic changes are again noted. Vascular: Carotid atherosclerotic calcifications are noted. Skull: Normal. Negative for fracture or focal lesion. Sinuses/Orbits: No acute finding. Other: None. IMPRESSION: 1. No evidence of acute intracranial abnormality. 2. Unchanged RIGHT occipital infarct.  No evidence of hemorrhage. 3. Mild chronic small-vessel white matter ischemic changes and remote LEFT occipital infarct. Electronically Signed   By: Margarette Canada M.D.   On: 03/21/2020 11:58   CT CHEST WO CONTRAST  Result Date: 03/21/2020 CLINICAL DATA:  Swelling. Wounds in bilateral lower extremities. Left arm appears swollen. Infection in left foot. Abdominal pain. Abdominal distention. EXAM: CT CHEST, ABDOMEN, AND PELVIS WITH CONTRAST TECHNIQUE: Multidetector CT imaging of the chest, abdomen and pelvis  was performed following the standard protocol during bolus administration of intravenous contrast. CONTRAST:  161m OMNIPAQUE IOHEXOL 300 MG/ML  SOLN  COMPARISON:  CT of the chest, abdomen, and pelvis February 24, 2020 FINDINGS: CT CHEST FINDINGS Cardiovascular: Three-vessel calcified atherosclerosis identified. Persistent cardiomegaly. Mild atherosclerosis in the thoracic aorta. The thoracic aorta is nonaneurysmal with no dissection. Central pulmonary arteries are normal in caliber. No obvious central filling defects. Mediastinum/Nodes: The patient is status post right mastectomy. The chest wall is otherwise normal. The thyroid and esophagus are normal. There is a small left pleural effusion, similar in the interval. There is a small to moderate right pleural effusion which is probably a little larger in the interval. No pericardial effusion. Mediastinal adenopathy again identified and not significantly changed. A right paratracheal node is unchanged measuring 2.9 by 1.8 cm on both studies. Other enlarged mediastinal are without significant change. An enlarged right supraclavicular node remains measuring 3.3 by 1.2 cm on today's study and the previous study. Shotty nodes in the bilateral axilla are stable. A left PICC line terminates in the SVC below the brachiocephalic confluence. Lungs/Pleura: Central airways are normal. No pneumothorax. Interlobular septal thickening consistent with mild edema. Right greater than left pleural effusions with associated atelectasis. No new infiltrates to suggest pneumonia. No pulmonary nodules or masses are identified. Musculoskeletal: Degenerative changes in the thoracic spine. No acute bony abnormalities. CT ABDOMEN PELVIS FINDINGS Hepatobiliary: No focal liver abnormality is seen. No gallstones, gallbladder wall thickening, or biliary dilatation. Pancreas: Unremarkable. No pancreatic ductal dilatation or surrounding inflammatory changes. Spleen: Normal in size without focal abnormality. Adrenals/Urinary Tract: Adrenal glands are unremarkable. Kidneys are normal, without renal calculi, focal lesion, or hydronephrosis. Bladder  is unremarkable. Stomach/Bowel: Stomach is within normal limits. Appendix appears normal. No evidence of bowel wall thickening, distention, or inflammatory changes. Vascular/Lymphatic: Mild calcified atherosclerosis in the inferior abdominal aorta, extending into the iliac vessels. No definite adenopathy in the abdomen or pelvis. Reproductive: Status post hysterectomy. No adnexal masses. Other: Free fluid in the pelvis. Significant edema in the subcutaneous fat diffusely. Skin thickening, particularly dependently, likely due to edema. No free air. Musculoskeletal: Degenerative changes in the left greater than right hip. Degenerative changes in the lumbar spine. IMPRESSION: 1. Free fluid in the pelvis, pleural effusions, edema in the subcutaneous fat, and skin thickening are all likely due to volume overload. 2. Pulmonary venous congestion/mild pulmonary edema. 3. Adenopathy in the right supraclavicular region and mediastinum, unchanged since October of 2020. The adenopathy could be reactive, metastatic, or due to lymphoma. Recommend clinical correlation and attention on follow-up. 4. Atherosclerosis in the thoracic and abdominal aorta. Three-vessel coronary artery disease. 5. No other significant abnormalities. Electronically Signed   By: DDorise BullionIII M.D   On: 03/21/2020 12:24   CT ABDOMEN PELVIS W CONTRAST  Result Date: 03/21/2020 CLINICAL DATA:  Swelling. Wounds in bilateral lower extremities. Left arm appears swollen. Infection in left foot. Abdominal pain. Abdominal distention. EXAM: CT CHEST, ABDOMEN, AND PELVIS WITH CONTRAST TECHNIQUE: Multidetector CT imaging of the chest, abdomen and pelvis was performed following the standard protocol during bolus administration of intravenous contrast. CONTRAST:  1043mOMNIPAQUE IOHEXOL 300 MG/ML  SOLN COMPARISON:  CT of the chest, abdomen, and pelvis February 24, 2020 FINDINGS: CT CHEST FINDINGS Cardiovascular: Three-vessel calcified atherosclerosis  identified. Persistent cardiomegaly. Mild atherosclerosis in the thoracic aorta. The thoracic aorta is nonaneurysmal with no dissection. Central pulmonary arteries are normal in caliber. No obvious central filling defects. Mediastinum/Nodes: The patient is status  post right mastectomy. The chest wall is otherwise normal. The thyroid and esophagus are normal. There is a small left pleural effusion, similar in the interval. There is a small to moderate right pleural effusion which is probably a little larger in the interval. No pericardial effusion. Mediastinal adenopathy again identified and not significantly changed. A right paratracheal node is unchanged measuring 2.9 by 1.8 cm on both studies. Other enlarged mediastinal are without significant change. An enlarged right supraclavicular node remains measuring 3.3 by 1.2 cm on today's study and the previous study. Shotty nodes in the bilateral axilla are stable. A left PICC line terminates in the SVC below the brachiocephalic confluence. Lungs/Pleura: Central airways are normal. No pneumothorax. Interlobular septal thickening consistent with mild edema. Right greater than left pleural effusions with associated atelectasis. No new infiltrates to suggest pneumonia. No pulmonary nodules or masses are identified. Musculoskeletal: Degenerative changes in the thoracic spine. No acute bony abnormalities. CT ABDOMEN PELVIS FINDINGS Hepatobiliary: No focal liver abnormality is seen. No gallstones, gallbladder wall thickening, or biliary dilatation. Pancreas: Unremarkable. No pancreatic ductal dilatation or surrounding inflammatory changes. Spleen: Normal in size without focal abnormality. Adrenals/Urinary Tract: Adrenal glands are unremarkable. Kidneys are normal, without renal calculi, focal lesion, or hydronephrosis. Bladder is unremarkable. Stomach/Bowel: Stomach is within normal limits. Appendix appears normal. No evidence of bowel wall thickening, distention, or  inflammatory changes. Vascular/Lymphatic: Mild calcified atherosclerosis in the inferior abdominal aorta, extending into the iliac vessels. No definite adenopathy in the abdomen or pelvis. Reproductive: Status post hysterectomy. No adnexal masses. Other: Free fluid in the pelvis. Significant edema in the subcutaneous fat diffusely. Skin thickening, particularly dependently, likely due to edema. No free air. Musculoskeletal: Degenerative changes in the left greater than right hip. Degenerative changes in the lumbar spine. IMPRESSION: 1. Free fluid in the pelvis, pleural effusions, edema in the subcutaneous fat, and skin thickening are all likely due to volume overload. 2. Pulmonary venous congestion/mild pulmonary edema. 3. Adenopathy in the right supraclavicular region and mediastinum, unchanged since October of 2020. The adenopathy could be reactive, metastatic, or due to lymphoma. Recommend clinical correlation and attention on follow-up. 4. Atherosclerosis in the thoracic and abdominal aorta. Three-vessel coronary artery disease. 5. No other significant abnormalities. Electronically Signed   By: Dorise Bullion III M.D   On: 03/21/2020 12:24   DG Chest Port 1 View  Result Date: 03/21/2020 CLINICAL DATA:  Sepsis and shortness of breath. EXAM: PORTABLE CHEST 1 VIEW COMPARISON:  03/11/2020 and prior radiographs FINDINGS: Cardiomegaly and pulmonary vascular congestion again noted. Bilateral airspace opacities have decreased from 03/11/2020. There is no evidence of pneumothorax or large pleural effusion. A LEFT-sided PICC line is again identified. IMPRESSION: Decreased bilateral airspace which may represent pneumonia and/or edema. No other significant change. Electronically Signed   By: Margarette Canada M.D.   On: 03/21/2020 08:04     Labs:   Basic Metabolic Panel: Recent Labs  Lab 04/22/20 1613 04/23/20 0857 04/24/20 1003 04/25/20 0528 04/27/20 0522 04/28/20 0503  NA 139 137 139 140 141 136  K 2.8*  2.8* 3.2* 3.6 3.7 3.6  CL 91* 93* 94* 97* 101 97*  CO2 35* 34* 32 30 28 26   GLUCOSE 109* 100* 121* 121* 98 162*  BUN 31* 31* 25* 22* 17 19  CREATININE 1.43* 1.26* 1.13* 1.07* 0.98 1.22*  CALCIUM 8.0* 7.8* 7.8* 8.0* 8.0* 8.0*  MG 1.9  --  1.9  --   --   --   PHOS  --   --  3.3  --   --   --    GFR Estimated Creatinine Clearance: 53.1 mL/min (A) (by C-G formula based on SCr of 1.22 mg/dL (H)). Liver Function Tests: Recent Labs  Lab 04/22/20 1613 04/25/20 0528  AST 25 26  ALT 15 15  ALKPHOS 157* 168*  BILITOT 0.8 0.6  PROT 6.6 6.5  ALBUMIN 2.2* 2.0*   No results for input(s): LIPASE, AMYLASE in the last 168 hours. No results for input(s): AMMONIA in the last 168 hours. Coagulation profile No results for input(s): INR, PROTIME in the last 168 hours.  CBC: Recent Labs  Lab 04/22/20 1613 04/24/20 1003 04/25/20 0528 04/28/20 0503  WBC 9.1 8.2 9.9 8.9  NEUTROABS 6.6  --   --   --   HGB 11.8* 12.1 13.2 12.0  HCT 38.4 39.7 41.8 38.7  MCV 77.7* 75.8* 76.3* 75.6*  PLT 458* 420* 442* 465*   Cardiac Enzymes: No results for input(s): CKTOTAL, CKMB, CKMBINDEX, TROPONINI in the last 168 hours. BNP: Invalid input(s): POCBNP CBG: Recent Labs  Lab 04/27/20 1114 04/27/20 1630 04/27/20 2136 04/28/20 0728 04/28/20 1154  GLUCAP 112* 154* 133* 141* 149*   D-Dimer No results for input(s): DDIMER in the last 72 hours. Hgb A1c No results for input(s): HGBA1C in the last 72 hours. Lipid Profile No results for input(s): CHOL, HDL, LDLCALC, TRIG, CHOLHDL, LDLDIRECT in the last 72 hours. Thyroid function studies No results for input(s): TSH, T4TOTAL, T3FREE, THYROIDAB in the last 72 hours.  Invalid input(s): FREET3 Anemia work up No results for input(s): VITAMINB12, FOLATE, FERRITIN, TIBC, IRON, RETICCTPCT in the last 72 hours. Microbiology No results found for this or any previous visit (from the past 240 hour(s)).   Discharge Instructions:   Discharge Instructions     Diet Carb Modified   Complete by: As directed    Discharge instructions   Complete by: As directed    Follow-up with your primary care provider at the skilled nursing facility in 3 to 5 days.  Take medications as prescribed.   Increase activity slowly   Complete by: As directed    No wound care   Complete by: As directed      Allergies as of 04/28/2020      Reactions   Tramadol Swelling   Nsaids Other (See Comments)   Pancreatitis   Tolmetin Other (See Comments)   Pancreatitis   Tylenol [acetaminophen] Other (See Comments)   unknown   Aspirin Other (See Comments)   "Makes my pancreas act up"       Medication List    STOP taking these medications   ceFEPime  IVPB Commonly known as: MAXIPIME   dapagliflozin propanediol 10 MG Tabs tablet Commonly known as: FARXIGA   gabapentin 400 MG capsule Commonly known as: NEURONTIN   HumaLOG 100 UNIT/ML injection Generic drug: insulin lispro   LORazepam 0.5 MG tablet Commonly known as: ATIVAN   oxyCODONE 15 MG immediate release tablet Commonly known as: ROXICODONE   potassium chloride 10 MEQ tablet Commonly known as: KLOR-CON     TAKE these medications   acetaminophen 325 MG tablet Commonly known as: TYLENOL Take 2 tablets (650 mg total) by mouth every 6 (six) hours.   Adult One Daily Gummies Chew Chew 1 capsule by mouth daily.   amitriptyline 100 MG tablet Commonly known as: ELAVIL Take 100 mg by mouth at bedtime.   atorvastatin 20 MG tablet Commonly known as: LIPITOR Take 1 tablet (20 mg total) by mouth daily.  budesonide-formoterol 160-4.5 MCG/ACT inhaler Commonly known as: SYMBICORT Inhale 2 puffs into the lungs 2 (two) times daily.   busPIRone 10 MG tablet Commonly known as: BUSPAR Take 1 tablet (10 mg total) by mouth 3 (three) times daily.   carbamazepine 200 MG tablet Commonly known as: TEGRETOL Take 2 tablets (400 mg total) by mouth 2 (two) times daily.   clopidogrel 75 MG tablet Commonly known  as: Plavix Take 1 tablet (75 mg total) by mouth daily.   dicyclomine 10 MG capsule Commonly known as: BENTYL Take 1 capsule (10 mg total) by mouth 4 (four) times daily -  before meals and at bedtime.   docusate sodium 100 MG capsule Commonly known as: COLACE Take 1 capsule (100 mg total) by mouth 2 (two) times daily as needed for mild constipation or moderate constipation.   feeding supplement (GLUCERNA SHAKE) Liqd Take 237 mLs by mouth 2 (two) times daily between meals.   furosemide 40 MG tablet Commonly known as: LASIX Take 1 tablet (40 mg total) by mouth daily.   hydrocortisone cream 1 % Apply 1 application topically 3 (three) times daily as needed for itching (minor skin irritation).   insulin aspart 100 UNIT/ML injection Commonly known as: novoLOG Inject 0-6 Units into the skin 3 (three) times daily with meals.   insulin glargine 100 UNIT/ML injection Commonly known as: Lantus Inject 0.08 mLs (8 Units total) into the skin at bedtime. What changed:   how much to take  when to take this   lipase/protease/amylase 36000 UNITS Cpep capsule Commonly known as: Creon Take 2 capsule by mouth with each meal and 1 capsule by mouth with each snack What changed:   how much to take  how to take this  when to take this  additional instructions   metoprolol succinate 50 MG 24 hr tablet Commonly known as: TOPROL-XL Take 1 tablet (50 mg total) by mouth daily. Take with or immediately following a meal. What changed: additional instructions   multivitamin with minerals Tabs tablet Take 1 tablet by mouth daily.   Narcan 4 MG/0.1ML Liqd nasal spray kit Generic drug: naloxone Place 1 spray into the nose as needed (opioid overdose).   nicotine 14 mg/24hr patch Commonly known as: NICODERM CQ - dosed in mg/24 hours Place 1 patch (14 mg total) onto the skin daily.   OLANZapine 7.5 MG tablet Commonly known as: ZYPREXA Take 1 tablet (7.5 mg total) by mouth at bedtime.    ondansetron 4 MG disintegrating tablet Commonly known as: ZOFRAN-ODT Take 1 tablet (4 mg total) by mouth every 8 (eight) hours as needed for nausea or vomiting (if no IV access).   polyethylene glycol 17 g packet Commonly known as: MIRALAX / GLYCOLAX Take 17 g by mouth daily as needed for moderate constipation.   ProAir HFA 108 (90 Base) MCG/ACT inhaler Generic drug: albuterol Inhale 2 puffs into the lungs every 6 (six) hours as needed for wheezing or shortness of breath.   simethicone 80 MG chewable tablet Commonly known as: MYLICON Chew 1 tablet (80 mg total) by mouth 4 (four) times daily as needed (gas pain).           Follow-up Information    [facility] Follow up.   Why: agency will provide home health physical and occupational therapy. Contact information: [place] [address] [phone]                Time coordinating discharge: 39 minutes  Signed:  Natividad Halls  [facility] 04/28/2020, 2:37 PM

## 2020-04-28 NOTE — TOC Transition Note (Addendum)
Transition of Care [facility]) - CM/SW Discharge Note   Patient Details  Name: Morgan Beasley MRN: [id number] Date of Birth: July 26, 1960  Transition of Care [facility]) CM/SW Contact:  Lanier Clam, RN Phone Number: 04/28/2020, 2:41 PM   Clinical Narrative:  Iline Oven for [facility] WUJ#[id number], eff 12/29-1/3 NRD 1/3-[name]. Await D/c summary, then rm#,tel# report [phone]. PTAR for transport.  3:30p-going to rm#112B, report tel#[phone].PTAR called.   Final next level of care: Skilled Nursing Facility Barriers to Discharge: No Barriers Identified   Patient Goals and CMS Choice Patient states their goals for this hospitalization and ongoing recovery are:: to go home with daughter CMS Medicare.gov Compare Post Acute Care list provided to:: Patient Represenative (must comment) (daughters) Choice offered to / list presented to : Adult Children  Discharge Placement PASRR number recieved: 03/22/20 Existing PASRR number confirmed : 04/10/20          Patient chooses bed at: Other - please specify in the comment section below: [facility]) Patient to be transferred to facility by: PTAR Name of family member notified: Scherrie Gerlach [phone] Patient and family notified of of transfer: 04/28/20  Discharge Plan and Services   Discharge Planning Services: CM Consult Post Acute Care Choice: Skilled Nursing Facility                    HH Arranged: PT,OT [facility] Agency: [facility] Date [facility] Agency Contacted: 04/20/20 Time HH Agency Contacted: [phone] Representative spoke with at [facility] Agency: Becky Sax  Social Determinants of Health (SDOH) Interventions     Readmission Risk Interventions Readmission Risk Prevention Plan 03/18/2020 10/21/2019  Transportation Screening Complete Complete  Medication Review (RN Care Manager) - Complete  PCP or Specialist appointment within 3-5 days of discharge Not Complete Complete  PCP/Specialist Appt Not  Complete comments First available appt 12/1 -  HRI or Home Care Consult Complete Complete  SW Recovery Care/Counseling Consult Complete Complete  Palliative Care Screening Not Applicable Not Applicable  Skilled Nursing Facility Not Applicable Not Applicable  Some recent data might be hidden

## 2020-04-28 NOTE — Consult Note (Signed)
Consultation Note Date: 04/28/2020   Patient Name: Morgan Beasley  DOB: 06-Dec-1960  MRN: [id number]  Age / Sex: 59 y.o., female  PCP: Morgan Sheer, NP Referring Physician: Flora Lipps, MD  Reason for Consultation: Establishing goals of care  HPI/Patient Profile: 59 y.o. female  with past medical history of depression, bipolar disorder, substance abuse, pulmonary valve endocarditis, multiple CVAs admitted on 03/21/2020 with agitation hallucinations requiring Precedex drip in ICU.  She was started on antipsychotics and scheduled to be discharged to [facility] initially.  She had some stabilization more plan to discharge patient home but she then developed distention with associated nausea and vomiting and CT revealed small bowel distention with obstruction versus ileus.  NG tube was placed but pulled out x2.  She continues to have bowel movements but has x-ray consistent with ileus and general surgery recommended conservative management.  She has continued to have poor oral intake and progression and palliative care was consulted for goals of care.  Clinical Assessment and Goals of Care Palliative care consult received.  Chart reviewed including personal review of pertinent labs and imaging.  I attempted to see and examine patient.  She was in bed in no distress but she could not engage in any meaningful conversation with me.  She is very preoccupied with finding items in her room, such as back scratcher and remote.  She does not have insight into her current problems or condition.  No family present at time of my encounter.  SUMMARY OF RECOMMENDATIONS   - Full code/full scope -Morgan Beasley is not able to participate in meaningful conversation regarding goals of care.  Will need to coordinate with her healthcare proxy (daughter per chart review). -No family at bedside at time of my encounter.  I did  not reach them via phone today. -Palliative care will attempt follow-up tomorrow.  Code Status/Advance Care Planning:  Full code  Prognosis:   Unable to determine  Discharge Planning: To Be Determined      Primary Diagnoses: Present on Admission: . Cerebral embolism with cerebral infarction . Acute metabolic encephalopathy . COPD (chronic obstructive pulmonary disease) ([place]) . Diabetic peripheral neuropathy associated with type 2 diabetes mellitus ([place]) . Mixed hyperlipidemia   I have reviewed the medical record, interviewed the patient and family, and examined the patient. The following aspects are pertinent.  Past Medical History:  Diagnosis Date  . Alcohol dependence ([place])   . Anemia   . Anxiety   . Breast cancer ([place])   . Chronic combined systolic and diastolic CHF (congestive heart failure) ([place])   . Cigarette nicotine dependence   . CKD (chronic kidney disease), stage III ([place])   . Colon polyps   . COPD (chronic obstructive pulmonary disease) ([place])   . Diabetes mellitus without complication ([place])   . Diabetic neuropathy ([place])   . Gout   . Hyperlipemia   . Hypertension   . Insomnia   . Lymphedema   . Marijuana use   . Mild CAD 2016  a. NSTEMI 2016 in context of cocaine abuse, 50% RCA% at that time.  . OSA treated with BiPAP   . PAD (peripheral artery disease) ([place])    a. s/p L SFA stenting 08/2019. b. left fem-to-below-knee-popliteal bypass 09/2019.  Marland Kitchen Pancreatitis    acute on chronic due to ETOH initially.    . Sleep apnea    wears BIPAP  . Ulcer of foot ([place])    right   Social History   Socioeconomic History  . Marital status: Single    Spouse name: Not on file  . Number of children: Not on file  . Years of education: Not on file  . Highest education level: Not on file  Occupational History  . Not on file  Tobacco Use  . Smoking status: Light Tobacco Smoker    Types: Cigarettes  . Smokeless tobacco: Never Used  . Tobacco comment: 3  cigarettes a day  Vaping Use  . Vaping Use: Never used  Substance and Sexual Activity  . Alcohol use: Not Currently    Comment: Beer - "on the weekends hanging out, maybe 2 or 3"   . Drug use: Not Currently    Types: Marijuana    Comment: last used 8 2020  . Sexual activity: Not on file  Other Topics Concern  . Not on file  Social History Narrative  . Not on file   Social Determinants of Health   Financial Resource Strain: Not on file  Food Insecurity: Not on file  Transportation Needs: Not on file  Physical Activity: Not on file  Stress: Not on file  Social Connections: Not on file   Family History  Problem Relation Age of Onset  . Diabetes Other   . Heart disease Other   . Breast cancer Maternal Grandmother   . Breast cancer Paternal Grandmother   . Stroke Son   . Colon cancer Neg Hx   . Esophageal cancer Neg Hx   . Rectal cancer Neg Hx   . Stomach cancer Neg Hx    Scheduled Meds: . (feeding supplement) PROSource Plus  30 mL Oral BID BM  . acetaminophen  650 mg Oral Q6H  . amitriptyline  100 mg Oral QHS  . atorvastatin  20 mg Oral Daily  . busPIRone  10 mg Oral TID  . carbamazepine  400 mg Oral BID  . chlorhexidine  15 mL Mouth Rinse BID  . clopidogrel  75 mg Oral Daily  . collagenase   Topical Daily  . dicyclomine  10 mg Oral TID AC & HS  . feeding supplement (GLUCERNA SHAKE)  237 mL Oral BID BM  . insulin aspart  0-6 Units Subcutaneous TID WC  . insulin glargine  5 Units Subcutaneous QHS  . lipase/protease/amylase  72,000 Units Oral TID WC  . mouth rinse  15 mL Mouth Rinse q12n4p  . metoprolol succinate  50 mg Oral Daily  . mometasone-formoterol  2 puff Inhalation BID  . multivitamin with minerals  1 tablet Oral Daily  . nicotine  14 mg Transdermal Daily  . OLANZapine  7.5 mg Oral QHS  . protein supplement  1 Scoop Oral TID WC  . sodium chloride flush  10-40 mL Intracatheter Q12H   Continuous Infusions: . sodium chloride Stopped (04/25/20 0010)   PRN  Meds:.sodium chloride, albuterol, docusate sodium, haloperidol lactate, hydrALAZINE, hydrocortisone cream, lipase/protease/amylase, ondansetron (ZOFRAN) IV, ondansetron, oxyCODONE, polyethylene glycol, simethicone, sodium chloride flush, sodium chloride flush Medications Prior to Admission:  Prior to Admission medications  Medication Sig Start Date End Date Taking? Authorizing Provider  albuterol (PROAIR HFA) 108 (90 Base) MCG/ACT inhaler Inhale 2 puffs into the lungs every 6 (six) hours as needed for wheezing or shortness of breath.  05/19/14  Yes [provider]  amitriptyline (ELAVIL) 100 MG tablet Take 100 mg by mouth at bedtime.    Yes [provider]  atorvastatin (LIPITOR) 20 MG tablet Take 1 tablet (20 mg total) by mouth daily. 03/19/20 04/18/20 Yes Arrien, Jimmy Picket, MD  budesonide-formoterol [facility]) 160-4.5 MCG/ACT inhaler Inhale 2 puffs into the lungs 2 (two) times daily.   Yes [provider]  clopidogrel (PLAVIX) 75 MG tablet Take 1 tablet (75 mg total) by mouth daily. 09/27/19 09/26/20 Yes Brimage, Ronnette Juniper, DO  dapagliflozin propanediol (FARXIGA) 10 MG TABS tablet Take 1 tablet (10 mg total) by mouth daily before breakfast. 09/26/19  Yes Angelia Mould, MD  furosemide (LASIX) 40 MG tablet Take 1 tablet (40 mg total) by mouth daily. 03/19/20 04/18/20 Yes Arrien, Jimmy Picket, MD  gabapentin (NEURONTIN) 400 MG capsule Take 1 capsule (400 mg total) by mouth 3 (three) times daily for 30 doses. 03/18/20 03/28/20 Yes Arrien, Jimmy Picket, MD  insulin lispro (HUMALOG) 100 UNIT/ML injection Inject 14-20 Units into the skin 3 (three) times daily before meals.    Yes [provider]  lipase/protease/amylase (CREON) 36000 UNITS CPEP capsule Take 2 capsule by mouth with each meal and 1 capsule by mouth with each snack Patient taking differently: Take 36,000-72,000 Units by mouth See admin instructions. Take 72000 units by mouth with each meal  and 36000 units by mouth with each snack 09/17/19  Yes Armbruster, Carlota Raspberry, MD  LORazepam (ATIVAN) 0.5 MG tablet Take 1 tablet (0.5 mg total) by mouth every 6 (six) hours as needed for anxiety. 03/07/20  Yes Shelly Coss, MD  metoprolol succinate (TOPROL-XL) 50 MG 24 hr tablet Take 1 tablet (50 mg total) by mouth daily. Take with or immediately following a meal. Patient taking differently: Take 50 mg by mouth daily.  12/22/19  Yes Fay Records, MD  Multiple Vitamins-Minerals (ADULT ONE DAILY GUMMIES) CHEW Chew 1 capsule by mouth daily.   Yes [provider]  NARCAN 4 MG/0.1ML LIQD nasal spray kit Place 1 spray into the nose as needed (opioid overdose).  11/25/19  Yes [provider]  oxyCODONE (ROXICODONE) 15 MG immediate release tablet Take 15 mg by mouth every 6 (six) hours as needed for pain.   Yes [provider]  potassium chloride (K-DUR) 10 MEQ tablet Take 10 mEq by mouth daily as needed (For cramps).    Yes [provider]  acetaminophen (TYLENOL) 325 MG tablet Take 2 tablets (650 mg total) by mouth every 6 (six) hours. 04/15/20   Pokhrel, Corrie Mckusick, MD  busPIRone (BUSPAR) 10 MG tablet Take 1 tablet (10 mg total) by mouth 3 (three) times daily. 04/15/20   Pokhrel, Corrie Mckusick, MD  carbamazepine (TEGRETOL) 200 MG tablet Take 2 tablets (400 mg total) by mouth 2 (two) times daily. 04/15/20   Pokhrel, Corrie Mckusick, MD  docusate sodium (COLACE) 100 MG capsule Take 1 capsule (100 mg total) by mouth 2 (two) times daily as needed for mild constipation or moderate constipation. 04/15/20   Pokhrel, Corrie Mckusick, MD  hydrocortisone cream 1 % Apply 1 application topically 3 (three) times daily as needed for itching (minor skin irritation). 04/15/20   Pokhrel, Corrie Mckusick, MD  insulin aspart (NOVOLOG) 100 UNIT/ML injection Inject 0-6 Units into the skin 3 (three)  times daily with meals. 04/15/20   Pokhrel, Corrie Mckusick, MD  insulin aspart (NOVOLOG) 100 UNIT/ML injection Inject 0-5 Units into the skin  at bedtime. CBG < 70: Implement Hypoglycemia Standing Orders and refer to Hypoglycemia Standing Orders sidebar report  CBG 70 - 120: 0 units  CBG 121 - 150: 0 units  CBG 151 - 200: 0 units  CBG 201 - 250: 2 units  CBG 251 - 300: 3 units  CBG 301 - 350: 4 units  CBG 351 - 400: 5 units  CBG > 400 call MD and obtain STAT lab verification 04/15/20   Pokhrel, Corrie Mckusick, MD  insulin glargine (LANTUS) 100 UNIT/ML injection Inject 0.2 mLs (20 Units total) into the skin at bedtime. 04/15/20   Pokhrel, Corrie Mckusick, MD  nicotine (NICODERM CQ - DOSED IN MG/24 HOURS) 14 mg/24hr patch Place 1 patch (14 mg total) onto the skin daily. 04/16/20   Pokhrel, Laxman, MD  OLANZapine (ZYPREXA) 5 MG tablet Take 1 tablet (5 mg total) by mouth at bedtime. 04/15/20   Pokhrel, Corrie Mckusick, MD  ondansetron (ZOFRAN-ODT) 4 MG disintegrating tablet Take 1 tablet (4 mg total) by mouth every 8 (eight) hours as needed for nausea or vomiting (if no IV access). 04/15/20   Pokhrel, Corrie Mckusick, MD  polyethylene glycol (MIRALAX / GLYCOLAX) 17 g packet Take 17 g by mouth daily as needed for moderate constipation. 04/15/20   Pokhrel, Corrie Mckusick, MD  simethicone (MYLICON) 80 MG chewable tablet Chew 1 tablet (80 mg total) by mouth 4 (four) times daily as needed (gas pain). 04/15/20   Morgan Lipps, MD   Allergies  Allergen Reactions  . Tramadol Swelling  . Nsaids Other (See Comments)    Pancreatitis  . Tolmetin Other (See Comments)    Pancreatitis  . Tylenol [Acetaminophen] Other (See Comments)    unknown  . Aspirin Other (See Comments)    "Makes my pancreas act up"    Review of Systems  Unable to obtain  Physical Exam General: Alert, awake, in no acute distress.  Not agitated, but no cooperative either.  HEENT: No bruits, no goiter, no JVD Heart: Regular rate and rhythm. No murmur appreciated. Lungs: Good air movement, clear Abdomen: Soft, nontender, nondistended, positive bowel sounds.  Ext: No significant edema Skin: Warm and  dry  Vital Signs: BP 113/78   Pulse 98   Temp 98.7 F (37.1 C)   Resp 18   Ht _0  (1.676 m)   Wt 78.4 kg   SpO2 99%   BMI 27.90 kg/m  Pain Scale: 0-10 POSS *See Group Information*: 1-Acceptable,Awake and alert Pain Score: Asleep   SpO2: SpO2: 99 % O2 Device:SpO2: 99 % O2 Flow Rate: .O2 Flow Rate (L/min): 2 L/min  IO: Intake/output summary:   Intake/Output Summary (Last 24 hours) at 04/28/2020 7253 Last data filed at 04/27/2020 1700 Gross per 24 hour  Intake --  Output 300 ml  Net -300 ml    LBM: Last BM Date: 04/26/20 Baseline Weight: Weight: 87.1 kg Most recent weight: Weight: 78.4 kg     Palliative Assessment/Data:    Time Total: 35 minutes Greater than 50%  of this time was spent counseling and coordinating care related to the above assessment and plan.  Signed by: Micheline Rough, MD   Please contact Palliative Medicine Team phone at [phone] for questions and concerns.  For individual provider: See Shea Evans

## 2020-04-28 NOTE — Progress Notes (Signed)
Physical Therapy Treatment Patient Details Name: Morgan Beasley MRN: [id number] DOB: November 30, 1960 Today's Date: 04/28/2020    History of Present Illness Pt admitted from home 03/21/20  and dx with acute respiratory failure 2* pleural effusions and pulmonary edema as well as acute metabolic encephalopathy and cerebral embolism with cerebral infarction.  Pt with hx of PAD, Lymphadema, DM, diabetic neuropathy, COPD, CKD, polysubstance abuse, bipolar, and fem-pop bypass graft.    PT Comments    General Comments: sleeping but easily aroused "wheres my place?" oriented to hospital "Oh yeah", "wheres my shoes?". Pt AxO x 2 following all commands but present with impaired safety recall and poor judgement.  Speech is diificult to understand at times as pt tends to mumble. Assisted OOB to amb.  General bed mobility comments: pt self able with increased time.  General transfer comment: pt ranges from Supervision level to needing Min Assist pending situation and mood.  Pt was able to self rise from bed and toilet but also unsteady/drunken/slow moving with poor self correction and delayed corrective reaction responce. General Gait Details: First amb around bed without any AD pt had heavy reliance on furniture to steady self.  Poor standing posture flew forward and shuffled/drunken steps.  Pt c/o B foot pain R>L and swelling which also contributes to her instability.  Used B platform EVA walker this session to increase her posture, increase her stability and encourage an increased gait distance.  Heavy lean on walker pt was able to advance correctly around turns and thru doorway. Tolerated amb 85 feet but with c/o fatigue after.  Then assisted with amb to and from bathroom. Due to extended hospital stay and gait instability/weakness, pt will need ST Rehab at SNF.   Follow Up Recommendations  SNF     Equipment Recommendations  None recommended by PT    Recommendations for Other Services        Precautions / Restrictions Precautions Precautions: Fall    Mobility  Bed Mobility Overal bed mobility: Needs Assistance Bed Mobility: Supine to Sit;Sit to Supine     Supine to sit: Supervision Sit to supine: Supervision   General bed mobility comments: pt self able with increased time  Transfers Overall transfer level: Needs assistance Equipment used: None;1 person hand held assist Transfers: Sit to/from Stand;Stand Pivot Transfers Sit to Stand: Supervision;Min guard;Min assist Stand pivot transfers: Supervision;Min guard;Min assist       General transfer comment: pt ranges from Supervision level to needing Min Assist pending situation and mood.  Pt was able to self rise from bed and toilet but also unsteady/drunken/slow moving with poor self correction and delayed corrective reaction responce.  Ambulation/Gait Ambulation/Gait assistance: Min assist;Mod assist Gait Distance (Feet): 84 Feet Assistive device: Bilateral platform walker (EVA walker) Gait Pattern/deviations: Step-through pattern;Decreased stride length;Drifts right/left Gait velocity: decreased   General Gait Details: First amb around bed without any AD pt had heavy reliance on furniture to steady self.  Poor standing posture flew forward and shuffled/drunken steps.  Pt c/o B foot pain R>L and swelling which also contributes to her instability.  Used B platform EVA walker this session to increase her posture, increase her stability and encourage an increased gait distance.  Heavy lean on walker pt was able to advance correctly around turns and thru doorway. Tolerated amb 85 feet but with c/o fatigue after.  Then assisted with amb to and from bathroom.   Stairs             Psychologist, prison and probation services  Modified Rankin (Stroke Patients Only)       Balance                                            Cognition Arousal/Alertness: Awake/alert Behavior During Therapy: WFL for tasks  assessed/performed Overall Cognitive Status: No family/caregiver present to determine baseline cognitive functioning                   Orientation Level: Disoriented to;Situation;Place;Time   Memory: Decreased recall of precautions;Decreased short-term memory   Safety/Judgement: Decreased awareness of safety;Decreased awareness of deficits   Problem Solving: Decreased initiation;Slow processing General Comments: sleeping but easily aroused "wheres my place?" oriented to hospital "Oh yeah", "wheres my shoes?". Pt AxO x 2 following all commands but present with impaired safety recall and poor judgement.  Speech is diificult to understand at times as pt tends to mumble.      Exercises      General Comments        Pertinent Vitals/Pain Pain Assessment: Faces Faces Pain Scale: Hurts a little bit Pain Location: B feet R>L Pain Descriptors / Indicators: Discomfort;Other (Comment) (swelling) Pain Intervention(s): Monitored during session;Repositioned    Home Living                      Prior Function            PT Goals (current goals can now be found in the care plan section) Progress towards PT goals: Progressing toward goals    Frequency    Min 2X/week      PT Plan Current plan remains appropriate    Co-evaluation              AM-PAC PT "6 Clicks" Mobility   Outcome Measure  Help needed turning from your back to your side while in a flat bed without using bedrails?: A Little Help needed moving from lying on your back to sitting on the side of a flat bed without using bedrails?: A Little Help needed moving to and from a bed to a chair (including a wheelchair)?: A Little Help needed standing up from a chair using your arms (e.g., wheelchair or bedside chair)?: A Little Help needed to walk in hospital room?: A Little Help needed climbing 3-5 steps with a railing? : A Lot 6 Click Score: 17    End of Session Equipment Utilized During Treatment:  Gait belt Activity Tolerance: Patient limited by fatigue Patient left: in bed;with chair alarm set;with bed alarm set Nurse Communication: Mobility status PT Visit Diagnosis: Muscle weakness (generalized) (M62.81);Unsteadiness on feet (R26.81);Pain Pain - Right/Left: Right Pain - part of body: Ankle and joints of foot     Time: 1202-1234 PT Time Calculation (min) (ACUTE ONLY): 32 min  Charges:  $Gait Training: 8-22 mins $Therapeutic Activity: 8-22 mins                     Rica Koyanagi  PTA Acute  Rehabilitation Services Pager      [phone] Office      [phone]

## 2020-05-17 ENCOUNTER — Emergency Department: Payer: Self-pay

## 2020-05-17 ENCOUNTER — Inpatient Hospital Stay (HOSPITAL_COMMUNITY): Payer: Medicare Other

## 2020-05-17 ENCOUNTER — Emergency Department (HOSPITAL_COMMUNITY): Payer: Medicare Other

## 2020-05-17 ENCOUNTER — Other Ambulatory Visit: Payer: Self-pay

## 2020-05-17 ENCOUNTER — Inpatient Hospital Stay (HOSPITAL_COMMUNITY)
Admission: EM | Admit: 2020-05-17 | Discharge: 2020-05-27 | DRG: 871 | Disposition: A | Discharge status: Hospice | Payer: Medicare Other | Attending: Family Medicine | Admitting: Family Medicine

## 2020-05-17 ENCOUNTER — Encounter (HOSPITAL_COMMUNITY): Payer: Self-pay | Admitting: Emergency Medicine

## 2020-05-17 DIAGNOSIS — L03116 Cellulitis of left lower limb: Secondary | ICD-10-CM | POA: Diagnosis present

## 2020-05-17 DIAGNOSIS — E871 Hypo-osmolality and hyponatremia: Secondary | ICD-10-CM | POA: Diagnosis present

## 2020-05-17 DIAGNOSIS — I130 Hypertensive heart and chronic kidney disease with heart failure and stage 1 through stage 4 chronic kidney disease, or unspecified chronic kidney disease: Secondary | ICD-10-CM | POA: Diagnosis present

## 2020-05-17 DIAGNOSIS — F17210 Nicotine dependence, cigarettes, uncomplicated: Secondary | ICD-10-CM | POA: Diagnosis present

## 2020-05-17 DIAGNOSIS — Z794 Long term (current) use of insulin: Secondary | ICD-10-CM | POA: Diagnosis not present

## 2020-05-17 DIAGNOSIS — G4700 Insomnia, unspecified: Secondary | ICD-10-CM | POA: Diagnosis present

## 2020-05-17 DIAGNOSIS — I70263 Atherosclerosis of native arteries of extremities with gangrene, bilateral legs: Secondary | ICD-10-CM | POA: Diagnosis not present

## 2020-05-17 DIAGNOSIS — Z8719 Personal history of other diseases of the digestive system: Secondary | ICD-10-CM

## 2020-05-17 DIAGNOSIS — E1152 Type 2 diabetes mellitus with diabetic peripheral angiopathy with gangrene: Secondary | ICD-10-CM | POA: Diagnosis present

## 2020-05-17 DIAGNOSIS — E11649 Type 2 diabetes mellitus with hypoglycemia without coma: Secondary | ICD-10-CM | POA: Diagnosis present

## 2020-05-17 DIAGNOSIS — I5043 Acute on chronic combined systolic (congestive) and diastolic (congestive) heart failure: Secondary | ICD-10-CM | POA: Diagnosis present

## 2020-05-17 DIAGNOSIS — N179 Acute kidney failure, unspecified: Secondary | ICD-10-CM | POA: Diagnosis not present

## 2020-05-17 DIAGNOSIS — I10 Essential (primary) hypertension: Secondary | ICD-10-CM | POA: Diagnosis not present

## 2020-05-17 DIAGNOSIS — A419 Sepsis, unspecified organism: Principal | ICD-10-CM | POA: Diagnosis present

## 2020-05-17 DIAGNOSIS — I5023 Acute on chronic systolic (congestive) heart failure: Secondary | ICD-10-CM | POA: Diagnosis present

## 2020-05-17 DIAGNOSIS — E1140 Type 2 diabetes mellitus with diabetic neuropathy, unspecified: Secondary | ICD-10-CM | POA: Diagnosis present

## 2020-05-17 DIAGNOSIS — E872 Acidosis: Secondary | ICD-10-CM | POA: Diagnosis not present

## 2020-05-17 DIAGNOSIS — I96 Gangrene, not elsewhere classified: Secondary | ICD-10-CM

## 2020-05-17 DIAGNOSIS — N1831 Chronic kidney disease, stage 3a: Secondary | ICD-10-CM | POA: Diagnosis present

## 2020-05-17 DIAGNOSIS — M86171 Other acute osteomyelitis, right ankle and foot: Secondary | ICD-10-CM | POA: Diagnosis present

## 2020-05-17 DIAGNOSIS — F1410 Cocaine abuse, uncomplicated: Secondary | ICD-10-CM | POA: Diagnosis present

## 2020-05-17 DIAGNOSIS — Y92239 Unspecified place in hospital as the place of occurrence of the external cause: Secondary | ICD-10-CM | POA: Diagnosis not present

## 2020-05-17 DIAGNOSIS — J449 Chronic obstructive pulmonary disease, unspecified: Secondary | ICD-10-CM | POA: Diagnosis present

## 2020-05-17 DIAGNOSIS — Z9119 Patient's noncompliance with other medical treatment and regimen: Secondary | ICD-10-CM

## 2020-05-17 DIAGNOSIS — F17200 Nicotine dependence, unspecified, uncomplicated: Secondary | ICD-10-CM | POA: Diagnosis present

## 2020-05-17 DIAGNOSIS — J9601 Acute respiratory failure with hypoxia: Secondary | ICD-10-CM | POA: Diagnosis present

## 2020-05-17 DIAGNOSIS — G9341 Metabolic encephalopathy: Secondary | ICD-10-CM | POA: Diagnosis present

## 2020-05-17 DIAGNOSIS — E1169 Type 2 diabetes mellitus with other specified complication: Secondary | ICD-10-CM | POA: Diagnosis present

## 2020-05-17 DIAGNOSIS — F419 Anxiety disorder, unspecified: Secondary | ICD-10-CM | POA: Diagnosis present

## 2020-05-17 DIAGNOSIS — R6520 Severe sepsis without septic shock: Secondary | ICD-10-CM | POA: Diagnosis present

## 2020-05-17 DIAGNOSIS — I739 Peripheral vascular disease, unspecified: Secondary | ICD-10-CM | POA: Diagnosis present

## 2020-05-17 DIAGNOSIS — E1122 Type 2 diabetes mellitus with diabetic chronic kidney disease: Secondary | ICD-10-CM | POA: Diagnosis present

## 2020-05-17 DIAGNOSIS — Z7189 Other specified counseling: Secondary | ICD-10-CM | POA: Diagnosis not present

## 2020-05-17 DIAGNOSIS — R410 Disorientation, unspecified: Secondary | ICD-10-CM | POA: Diagnosis not present

## 2020-05-17 DIAGNOSIS — Z8673 Personal history of transient ischemic attack (TIA), and cerebral infarction without residual deficits: Secondary | ICD-10-CM

## 2020-05-17 DIAGNOSIS — L03032 Cellulitis of left toe: Secondary | ICD-10-CM | POA: Diagnosis present

## 2020-05-17 DIAGNOSIS — Z803 Family history of malignant neoplasm of breast: Secondary | ICD-10-CM

## 2020-05-17 DIAGNOSIS — T501X5A Adverse effect of loop [high-ceiling] diuretics, initial encounter: Secondary | ICD-10-CM | POA: Diagnosis not present

## 2020-05-17 DIAGNOSIS — Z20822 Contact with and (suspected) exposure to covid-19: Secondary | ICD-10-CM | POA: Diagnosis present

## 2020-05-17 DIAGNOSIS — E1165 Type 2 diabetes mellitus with hyperglycemia: Secondary | ICD-10-CM | POA: Diagnosis present

## 2020-05-17 DIAGNOSIS — E785 Hyperlipidemia, unspecified: Secondary | ICD-10-CM | POA: Diagnosis present

## 2020-05-17 DIAGNOSIS — Z515 Encounter for palliative care: Secondary | ICD-10-CM | POA: Diagnosis not present

## 2020-05-17 DIAGNOSIS — E875 Hyperkalemia: Secondary | ICD-10-CM | POA: Diagnosis present

## 2020-05-17 DIAGNOSIS — F1290 Cannabis use, unspecified, uncomplicated: Secondary | ICD-10-CM | POA: Diagnosis present

## 2020-05-17 DIAGNOSIS — T7611XA Adult physical abuse, suspected, initial encounter: Secondary | ICD-10-CM | POA: Diagnosis not present

## 2020-05-17 DIAGNOSIS — Z823 Family history of stroke: Secondary | ICD-10-CM

## 2020-05-17 DIAGNOSIS — Z853 Personal history of malignant neoplasm of breast: Secondary | ICD-10-CM

## 2020-05-17 DIAGNOSIS — F319 Bipolar disorder, unspecified: Secondary | ICD-10-CM | POA: Diagnosis present

## 2020-05-17 DIAGNOSIS — Z23 Encounter for immunization: Secondary | ICD-10-CM

## 2020-05-17 DIAGNOSIS — G4733 Obstructive sleep apnea (adult) (pediatric): Secondary | ICD-10-CM | POA: Diagnosis present

## 2020-05-17 DIAGNOSIS — I252 Old myocardial infarction: Secondary | ICD-10-CM

## 2020-05-17 DIAGNOSIS — Z91199 Patient's noncompliance with other medical treatment and regimen due to unspecified reason: Secondary | ICD-10-CM

## 2020-05-17 DIAGNOSIS — I2510 Atherosclerotic heart disease of native coronary artery without angina pectoris: Secondary | ICD-10-CM | POA: Diagnosis present

## 2020-05-17 DIAGNOSIS — F1910 Other psychoactive substance abuse, uncomplicated: Secondary | ICD-10-CM | POA: Diagnosis present

## 2020-05-17 DIAGNOSIS — Z833 Family history of diabetes mellitus: Secondary | ICD-10-CM

## 2020-05-17 DIAGNOSIS — Z66 Do not resuscitate: Secondary | ICD-10-CM | POA: Diagnosis present

## 2020-05-17 LAB — URINALYSIS, ROUTINE W REFLEX MICROSCOPIC
Bilirubin Urine: NEGATIVE
Glucose, UA: >=500 mg/dL — AB
Ketones, ur: NEGATIVE mg/dL
Leukocytes,Ua: NEGATIVE
Nitrite: NEGATIVE
Protein, ur: >=300 mg/dL — AB
Specific Gravity, Urine: 1.023 (ref 1.005–1.030)
pH: 5 (ref 5.0–8.0)

## 2020-05-17 LAB — I-STAT CHEM 8, ED
BUN: 12 mg/dL (ref 6–20)
Calcium, Ion: 1.08 mmol/L — ABNORMAL LOW (ref 1.15–1.40)
Chloride: 95 mmol/L — ABNORMAL LOW (ref 98–111)
Creatinine, Ser: 0.9 mg/dL (ref 0.44–1.00)
Glucose, Bld: 426 mg/dL — ABNORMAL HIGH (ref 70–99)
HCT: 37 % (ref 36.0–46.0)
Hemoglobin: 12.6 g/dL (ref 12.0–15.0)
Potassium: 3.7 mmol/L (ref 3.5–5.1)
Sodium: 131 mmol/L — ABNORMAL LOW (ref 135–145)
TCO2: 27 mmol/L (ref 22–32)

## 2020-05-17 LAB — RAPID URINE DRUG SCREEN, HOSP PERFORMED
Amphetamines: NOT DETECTED
Barbiturates: NOT DETECTED
Benzodiazepines: NOT DETECTED
Cocaine: POSITIVE — AB
Opiates: POSITIVE — AB
Tetrahydrocannabinol: NOT DETECTED

## 2020-05-17 LAB — BASIC METABOLIC PANEL
Anion gap: 12 (ref 5–15)
BUN: 14 mg/dL (ref 6–20)
CO2: 25 mmol/L (ref 22–32)
Calcium: 8.3 mg/dL — ABNORMAL LOW (ref 8.9–10.3)
Chloride: 95 mmol/L — ABNORMAL LOW (ref 98–111)
Creatinine, Ser: 1.09 mg/dL — ABNORMAL HIGH (ref 0.44–1.00)
GFR, Estimated: 59 mL/min — ABNORMAL LOW (ref >60)
Glucose, Bld: 435 mg/dL — ABNORMAL HIGH (ref 70–99)
Potassium: 3.9 mmol/L (ref 3.5–5.1)
Sodium: 132 mmol/L — ABNORMAL LOW (ref 135–145)

## 2020-05-17 LAB — CBG MONITORING, ED
Glucose-Capillary: 428 mg/dL — ABNORMAL HIGH (ref 70–99)
Glucose-Capillary: 347 mg/dL — ABNORMAL HIGH (ref 70–99)
Glucose-Capillary: 362 mg/dL — ABNORMAL HIGH (ref 70–99)

## 2020-05-17 LAB — LACTIC ACID, PLASMA
Lactic Acid, Venous: 1.6 mmol/L (ref 0.5–1.9)
Lactic Acid, Venous: 1.9 mmol/L (ref 0.5–1.9)

## 2020-05-17 LAB — CBC
HCT: 30.6 % — ABNORMAL LOW (ref 36.0–46.0)
Hemoglobin: 9.6 g/dL — ABNORMAL LOW (ref 12.0–15.0)
MCH: 24.1 pg — ABNORMAL LOW (ref 26.0–34.0)
MCHC: 31.4 g/dL (ref 30.0–36.0)
MCV: 76.7 fL — ABNORMAL LOW (ref 80.0–100.0)
Platelets: 517 10*3/uL — ABNORMAL HIGH (ref 150–400)
RBC: 3.99 MIL/uL (ref 3.87–5.11)
RDW: 24.3 % — ABNORMAL HIGH (ref 11.5–15.5)
WBC: 13.6 10*3/uL — ABNORMAL HIGH (ref 4.0–10.5)
nRBC: 0.2 % (ref 0.0–0.2)

## 2020-05-17 LAB — BLOOD GAS, VENOUS
Acid-Base Excess: 2.9 mmol/L — ABNORMAL HIGH (ref 0.0–2.0)
Bicarbonate: 27.8 mmol/L (ref 20.0–28.0)
O2 Saturation: 27.2 %
Patient temperature: 98.6
pCO2, Ven: 46.7 mmHg (ref 44.0–60.0)
pH, Ven: 7.391 (ref 7.250–7.430)
pO2, Ven: <31 mmHg — CL (ref 32.0–45.0)

## 2020-05-17 LAB — RESP PANEL BY RT-PCR (FLU A&B, COVID) ARPGX2
Influenza A by PCR: NEGATIVE
Influenza B by PCR: NEGATIVE
SARS Coronavirus 2 by RT PCR: NEGATIVE

## 2020-05-17 LAB — PROCALCITONIN: Procalcitonin: 0.16 ng/mL

## 2020-05-17 LAB — GLUCOSE, CAPILLARY: Glucose-Capillary: 244 mg/dL — ABNORMAL HIGH (ref 70–99)

## 2020-05-17 LAB — HEMOGLOBIN A1C
Hgb A1c MFr Bld: 9.3 % — ABNORMAL HIGH (ref 4.8–5.6)
Mean Plasma Glucose: 220.21 mg/dL

## 2020-05-17 LAB — BRAIN NATRIURETIC PEPTIDE: B Natriuretic Peptide: 2905.7 pg/mL — ABNORMAL HIGH (ref 0.0–100.0)

## 2020-05-17 MED ORDER — BUSPIRONE HCL 5 MG PO TABS
10.0000 mg | ORAL_TABLET | Freq: Three times a day (TID) | ORAL | Status: DC
  Administered 2020-05-17 – 2020-05-18 (×4): 10 mg via ORAL
  Filled 2020-05-17 (×3): qty 2
  Filled 2020-05-17: qty 1

## 2020-05-17 MED ORDER — PIPERACILLIN-TAZOBACTAM 3.375 G IVPB 30 MIN
3.3750 g | Freq: Once | INTRAVENOUS | Status: AC
  Administered 2020-05-17: 3.375 g via INTRAVENOUS
  Filled 2020-05-17: qty 50

## 2020-05-17 MED ORDER — OLANZAPINE 5 MG PO TABS
7.5000 mg | ORAL_TABLET | Freq: Every day | ORAL | Status: DC
  Administered 2020-05-17: 7.5 mg via ORAL
  Filled 2020-05-17: qty 2

## 2020-05-17 MED ORDER — VANCOMYCIN HCL IN DEXTROSE 1-5 GM/200ML-% IV SOLN
1000.0000 mg | Freq: Two times a day (BID) | INTRAVENOUS | Status: DC
  Administered 2020-05-17 – 2020-05-18 (×2): 1000 mg via INTRAVENOUS
  Filled 2020-05-17 (×2): qty 200

## 2020-05-17 MED ORDER — INSULIN ASPART 100 UNIT/ML ~~LOC~~ SOLN
5.0000 [IU] | SUBCUTANEOUS | Status: AC
  Administered 2020-05-17: 5 [IU] via SUBCUTANEOUS
  Filled 2020-05-17: qty 0.05

## 2020-05-17 MED ORDER — INSULIN ASPART 100 UNIT/ML ~~LOC~~ SOLN
0.0000 [IU] | Freq: Three times a day (TID) | SUBCUTANEOUS | Status: DC
  Administered 2020-05-18: 5 [IU] via SUBCUTANEOUS
  Administered 2020-05-18: 3 [IU] via SUBCUTANEOUS
  Administered 2020-05-18: 2 [IU] via SUBCUTANEOUS

## 2020-05-17 MED ORDER — GABAPENTIN 100 MG PO CAPS
100.0000 mg | ORAL_CAPSULE | Freq: Three times a day (TID) | ORAL | Status: DC
  Administered 2020-05-17: 100 mg via ORAL
  Filled 2020-05-17: qty 1

## 2020-05-17 MED ORDER — INSULIN GLARGINE 100 UNIT/ML ~~LOC~~ SOLN
5.0000 [IU] | Freq: Every day | SUBCUTANEOUS | Status: DC
  Administered 2020-05-17 – 2020-05-20 (×3): 5 [IU] via SUBCUTANEOUS
  Filled 2020-05-17 (×4): qty 0.05

## 2020-05-17 MED ORDER — AMITRIPTYLINE HCL 25 MG PO TABS
100.0000 mg | ORAL_TABLET | Freq: Every day | ORAL | Status: DC
  Administered 2020-05-17: 100 mg via ORAL
  Filled 2020-05-17: qty 4

## 2020-05-17 MED ORDER — CARBAMAZEPINE 200 MG PO TABS
400.0000 mg | ORAL_TABLET | Freq: Two times a day (BID) | ORAL | Status: DC
  Administered 2020-05-17 – 2020-05-21 (×7): 400 mg via ORAL
  Filled 2020-05-17 (×9): qty 2

## 2020-05-17 MED ORDER — ONDANSETRON HCL 4 MG/2ML IJ SOLN
4.0000 mg | Freq: Once | INTRAMUSCULAR | Status: AC
  Administered 2020-05-17: 4 mg via INTRAVENOUS
  Filled 2020-05-17: qty 2

## 2020-05-17 MED ORDER — FUROSEMIDE 10 MG/ML IJ SOLN
20.0000 mg | Freq: Two times a day (BID) | INTRAMUSCULAR | Status: DC
  Administered 2020-05-17 – 2020-05-18 (×3): 20 mg via INTRAVENOUS
  Filled 2020-05-17 (×3): qty 2

## 2020-05-17 MED ORDER — FUROSEMIDE 10 MG/ML IJ SOLN
20.0000 mg | Freq: Two times a day (BID) | INTRAMUSCULAR | Status: DC
  Filled 2020-05-17: qty 4

## 2020-05-17 MED ORDER — FUROSEMIDE 10 MG/ML IJ SOLN
40.0000 mg | Freq: Once | INTRAMUSCULAR | Status: AC
  Administered 2020-05-17: 40 mg via INTRAVENOUS

## 2020-05-17 MED ORDER — TETANUS-DIPHTH-ACELL PERTUSSIS 5-2.5-18.5 LF-MCG/0.5 IM SUSY
0.5000 mL | PREFILLED_SYRINGE | Freq: Once | INTRAMUSCULAR | Status: AC
  Administered 2020-05-17: 0.5 mL via INTRAMUSCULAR
  Filled 2020-05-17: qty 0.5

## 2020-05-17 MED ORDER — SODIUM CHLORIDE 0.9 % IV SOLN
2.0000 g | INTRAVENOUS | Status: DC
  Administered 2020-05-17: 2 g via INTRAVENOUS
  Filled 2020-05-17 (×2): qty 20

## 2020-05-17 MED ORDER — POLYETHYLENE GLYCOL 3350 17 G PO PACK
17.0000 g | PACK | Freq: Every day | ORAL | Status: DC | PRN
Start: 2020-05-17 — End: 2020-05-21

## 2020-05-17 MED ORDER — SODIUM CHLORIDE 0.9% FLUSH
3.0000 mL | Freq: Two times a day (BID) | INTRAVENOUS | Status: DC
  Administered 2020-05-17: 3 mL via INTRAVENOUS

## 2020-05-17 MED ORDER — ONDANSETRON HCL 4 MG/2ML IJ SOLN: 4.0000 mg | Freq: Four times a day (QID) | INTRAMUSCULAR | Status: DC | PRN

## 2020-05-17 MED ORDER — INSULIN ASPART 100 UNIT/ML ~~LOC~~ SOLN
0.0000 [IU] | Freq: Every day | SUBCUTANEOUS | Status: DC
  Administered 2020-05-17: 2 [IU] via SUBCUTANEOUS

## 2020-05-17 MED ORDER — MORPHINE SULFATE (PF) 4 MG/ML IV SOLN
4.0000 mg | Freq: Once | INTRAVENOUS | Status: AC
  Administered 2020-05-17: 4 mg via INTRAVENOUS
  Filled 2020-05-17: qty 1

## 2020-05-17 MED ORDER — LORAZEPAM 2 MG/ML IJ SOLN
0.5000 mg | Freq: Once | INTRAMUSCULAR | Status: AC | PRN
  Administered 2020-05-17: 0.5 mg via INTRAVENOUS
  Filled 2020-05-17: qty 1

## 2020-05-17 MED ORDER — ACETAMINOPHEN 325 MG PO TABS
650.0000 mg | ORAL_TABLET | Freq: Four times a day (QID) | ORAL | Status: DC | PRN
  Administered 2020-05-20: 650 mg via ORAL
  Filled 2020-05-17: qty 2

## 2020-05-17 MED ORDER — VANCOMYCIN HCL IN DEXTROSE 1-5 GM/200ML-% IV SOLN: 1000.0000 mg | Freq: Once | INTRAVENOUS | Status: DC

## 2020-05-17 MED ORDER — ENOXAPARIN SODIUM 40 MG/0.4ML ~~LOC~~ SOLN
40.0000 mg | SUBCUTANEOUS | Status: DC
  Administered 2020-05-17: 40 mg via SUBCUTANEOUS
  Filled 2020-05-17: qty 0.4

## 2020-05-17 MED ORDER — GABAPENTIN 400 MG PO CAPS
800.0000 mg | ORAL_CAPSULE | Freq: Three times a day (TID) | ORAL | Status: DC
  Administered 2020-05-17 – 2020-05-18 (×3): 800 mg via ORAL
  Filled 2020-05-17: qty 8
  Filled 2020-05-17 (×2): qty 2

## 2020-05-17 MED ORDER — LACTATED RINGERS IV SOLN: INTRAVENOUS | Status: DC

## 2020-05-17 MED ORDER — VANCOMYCIN HCL IN DEXTROSE 1-5 GM/200ML-% IV SOLN
1000.0000 mg | Freq: Once | INTRAVENOUS | Status: AC
  Administered 2020-05-17: 1000 mg via INTRAVENOUS
  Filled 2020-05-17: qty 200

## 2020-05-17 MED ORDER — ACETAMINOPHEN 650 MG RE SUPP: 650.0000 mg | Freq: Four times a day (QID) | RECTAL | Status: DC | PRN

## 2020-05-17 MED ORDER — ONDANSETRON HCL 4 MG PO TABS: 4.0000 mg | ORAL_TABLET | Freq: Four times a day (QID) | ORAL | Status: DC | PRN

## 2020-05-17 NOTE — Progress Notes (Signed)
Chaplain received call morning of 1/17 from person stating they are pt's sister.     Expressed concern for pt's living conditions / arrangements with another family member.   Stated they would like to change pt's power of attorney / living will.   Chaplain provided HIPPA compliant support with person on phone and education around process of changing HCPOA in [place]. We will check in with patient during admisison to assess competency and determine whether pt would like to change power of attorney.

## 2020-05-17 NOTE — ED Provider Notes (Addendum)
[facility] DEPT Provider Note   CSN: [id number] Arrival date & time: 05/17/20  0118     History No chief complaint on file.   Morgan Beasley is a 60 y.o. female.  The history is provided by the patient.  Hyperglycemia Blood sugar level PTA:  555 Severity:  Severe Onset quality:  Gradual Timing:  Constant Progression:  Worsening Chronicity:  Recurrent Diabetes status:  Controlled with insulin Current diabetic therapy:  Insulin Context: noncompliance   Relieved by:  Nothing Ineffective treatments:  None tried Associated symptoms: no abdominal pain, no chest pain, no dizziness, no fever and no shortness of breath   Risk factors: no family hx of diabetes   Patient with diabetes who has been out of her medication for several days.  She also notes 2 toes that are black in hue for approximately. No f/c/r.  No n/v/d.       Past Medical History:  Diagnosis Date  . Alcohol dependence ([place])   . Anemia   . Anxiety   . Breast cancer ([place])   . Chronic combined systolic and diastolic CHF (congestive heart failure) ([place])   . Cigarette nicotine dependence   . CKD (chronic kidney disease), stage III ([place])   . Colon polyps   . COPD (chronic obstructive pulmonary disease) ([place])   . Diabetes mellitus without complication ([place])   . Diabetic neuropathy ([place])   . Gout   . Hyperlipemia   . Hypertension   . Insomnia   . Lymphedema   . Marijuana use   . Mild CAD 2016   a. NSTEMI 2016 in context of cocaine abuse, 50% RCA% at that time.  . OSA treated with BiPAP   . PAD (peripheral artery disease) ([place])    a. s/p L SFA stenting 08/2019. b. left fem-to-below-knee-popliteal bypass 09/2019.  Marland Kitchen Pancreatitis    acute on chronic due to ETOH initially.    . Sleep apnea    wears BIPAP  . Ulcer of foot [facility])    right    Patient Active Problem List   Diagnosis Date Noted  . Acute metabolic encephalopathy 86/57/8469  . Anasarca 03/21/2020  . Cerebral  embolism with cerebral infarction 03/01/2020  . Septic shock ([place]) 02/23/2020  . Chronic systolic CHF (congestive heart failure) ([place]) 02/23/2020  . Obesity (BMI 30-39.9) 02/23/2020  . Ischemia of left lower extremity 10/09/2019  . PVD (peripheral vascular disease) ([place])   . Diabetic peripheral neuropathy associated with type 2 diabetes mellitus ([place])   . Acute on chronic diastolic congestive heart failure ([place])   . Lower extremity edema   . Dyspnea 09/21/2019  . Bronchitis 02/25/2019  . Uncontrolled diabetes mellitus with circulatory complication, with long-term current use of insulin ([place]) 02/25/2019  . Peripheral neuropathy 02/25/2019  . COPD (chronic obstructive pulmonary disease) ([place]) 02/25/2019  . Malignant neoplasm of upper-outer quadrant of right breast in female, estrogen receptor positive ([place]) 01/08/2019  . DKA (diabetic ketoacidosis) ([place]) 02/20/2018  . Hx-TIA (transient ischemic attack) 02/20/2018  . Mixed hyperlipidemia 02/20/2018  . OAB (overactive bladder) 10/24/2017  . Poorly controlled diabetes mellitus ([place]) 10/24/2017  . Recurrent UTI 10/24/2017  . Gastritis, Helicobacter pylori 62/95/2841  . Microscopic hematuria 09/26/2016  . Intractable vomiting 09/08/2016  . Chronic bilateral upper abdominal pain 09/08/2016  . Sleep disturbance 06/22/2016  . Tobacco dependence 01/16/2016  . Cocaine abuse ([place]) 04/24/2015  . 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 Lymphedema of arm 02/06/2015  . Anxiety and depression 06/05/2014  .  Invasive ductal carcinoma of right breast ([place]) 05/20/2014  . Gastro-esophageal reflux disease without esophagitis 04/06/2014  . Other fatigue 04/06/2014  . Snoring 04/06/2014  . Other abnormal and inconclusive findings on diagnostic imaging of breast 04/03/2014  . Obstructive sleep apnea (adult) (pediatric) 03/06/2014  . Benign essential HTN 02/18/2014    Past Surgical History:  Procedure Laterality Date  . ABDOMINAL AORTOGRAM W/LOWER EXTREMITY N/A  09/26/2019   Procedure: ABDOMINAL AORTOGRAM W/LOWER EXTREMITY;  Surgeon: Angelia Mould, MD;  Location: [facility];  Service: Cardiovascular;  Laterality: N/A;  . ABDOMINAL AORTOGRAM W/LOWER EXTREMITY Bilateral 12/08/2019   Procedure: ABDOMINAL AORTOGRAM W/LOWER EXTREMITY;  Surgeon: Waynetta Sandy, MD;  Location: [place] [facility];  Service: Cardiovascular;  Laterality: Bilateral;  . ABDOMINAL AORTOGRAM W/LOWER EXTREMITY Left 01/26/2020   Procedure: ABDOMINAL AORTOGRAM W/LOWER EXTREMITY;  Surgeon: Waynetta Sandy, MD;  Location: [place] [facility] [facility];  Service: Cardiovascular;  Laterality: Left;  . ABDOMINAL HYSTERECTOMY    . BUBBLE STUDY  03/04/2020   Procedure: BUBBLE STUDY;  Surgeon: Elouise Munroe, MD;  Location: [facility];  Service: Cardiology;;  . [place]  . FEMORAL-POPLITEAL BYPASS GRAFT Left 10/14/2019   Procedure: BYPASS GRAFT LEFT FEMORAL-POPLITEAL ARTERY USING NONREVERSED SAPHENOUS VEIN;  Surgeon: Waynetta Sandy, MD;  Location: [facility];  Service: Vascular;  Laterality: Left;  Marland Kitchen MASTECTOMY Right Febe [place] 2016  . PERIPHERAL VASCULAR ATHERECTOMY Left 01/26/2020   Procedure: PERIPHERAL VASCULAR ATHERECTOMY;  Surgeon: Waynetta Sandy, MD;  Location: [place] [facility];  Service: Cardiovascular;  Laterality: Left;  PT and AT - [facility]  . PERIPHERAL VASCULAR BALLOON ANGIOPLASTY Left 01/26/2020   Procedure: PERIPHERAL VASCULAR BALLOON ANGIOPLASTY;  Surgeon: Waynetta Sandy, MD;  Location: [facility];  Service: Cardiovascular;  Laterality: Left;  [place] Trunk   . PERIPHERAL VASCULAR INTERVENTION Left 09/26/2019   Procedure: PERIPHERAL VASCULAR INTERVENTION;  Surgeon: Angelia Mould, MD;  Location: [facility];  Service: Cardiovascular;  Laterality: Left;  superficial femoral  . TEE WITHOUT CARDIOVERSION N/A 03/04/2020   Procedure: TRANSESOPHAGEAL ECHOCARDIOGRAM (TEE);  Surgeon: Elouise Munroe, MD;  Location: [facility];  Service: Cardiology;  Laterality: N/A;     OB History   No obstetric history on file.     Family History  Problem Relation Age of Onset  . Diabetes Other   . Heart disease Other   . Breast cancer Maternal Grandmother   . Breast cancer Paternal Grandmother   . Stroke Son   . Colon cancer Neg Hx   . Esophageal cancer Neg Hx   . Rectal cancer Neg Hx   . Stomach cancer Neg Hx     Social History   Tobacco Use  . Smoking status: Light Tobacco Smoker    Types: Cigarettes  . Smokeless tobacco: Never Used  . Tobacco comment: 3 cigarettes a day  Vaping Use  . Vaping Use: Never used  Substance Use Topics  . Alcohol use: Not Currently    Comment: Beer - "on the weekends hanging out, maybe 2 or 3"   . Drug use: Not Currently    Types: Marijuana    Comment: last used 8 2020    Home Medications Prior to Admission medications   Medication Sig Start Date End Date Taking? Authorizing Provider  acetaminophen (TYLENOL) 325 MG tablet Take 2 tablets (650 mg total) by mouth every 6 (six) hours. 04/15/20   Flora Lipps, MD  albuterol [facility] HFA)  108 (90 Base) MCG/ACT inhaler Inhale 2 puffs into the lungs every 6 (six) hours as needed for wheezing or shortness of breath.  05/19/14   [provider]  amitriptyline (ELAVIL) 100 MG tablet Take 100 mg by mouth at bedtime.     [provider]  atorvastatin (LIPITOR) 20 MG tablet Take 1 tablet (20 mg total) by mouth daily. 03/19/20 04/18/20  Arrien, Jimmy Picket, MD  budesonide-formoterol [facility]) 160-4.5 MCG/ACT inhaler Inhale 2 puffs into the lungs 2 (two) times daily.    [provider]  busPIRone (BUSPAR) 10 MG tablet Take 1 tablet (10 mg total) by mouth 3 (three) times daily. 04/15/20   Pokhrel, Corrie Mckusick, MD  carbamazepine (TEGRETOL) 200 MG tablet Take 2 tablets (400 mg total) by mouth 2 (two) times daily. 04/15/20   Pokhrel, Corrie Mckusick, MD  clopidogrel (PLAVIX) 75 MG tablet Take 1  tablet (75 mg total) by mouth daily. 09/27/19 09/26/20  Lyndee Hensen, DO  dicyclomine (BENTYL) 10 MG capsule Take 1 capsule (10 mg total) by mouth 4 (four) times daily -  before meals and at bedtime. 04/28/20   Pokhrel, Corrie Mckusick, MD  docusate sodium (COLACE) 100 MG capsule Take 1 capsule (100 mg total) by mouth 2 (two) times daily as needed for mild constipation or moderate constipation. 04/15/20   Pokhrel, Corrie Mckusick, MD  feeding supplement, GLUCERNA SHAKE, (GLUCERNA SHAKE) LIQD Take 237 mLs by mouth 2 (two) times daily between meals. 04/28/20   Pokhrel, Corrie Mckusick, MD  furosemide (LASIX) 40 MG tablet Take 1 tablet (40 mg total) by mouth daily. 03/19/20 04/18/20  Arrien, Jimmy Picket, MD  hydrocortisone cream 1 % Apply 1 application topically 3 (three) times daily as needed for itching (minor skin irritation). 04/15/20   Pokhrel, Corrie Mckusick, MD  insulin aspart (NOVOLOG) 100 UNIT/ML injection Inject 0-6 Units into the skin 3 (three) times daily with meals. 04/15/20   Pokhrel, Corrie Mckusick, MD  insulin glargine (LANTUS) 100 UNIT/ML injection Inject 0.08 mLs (8 Units total) into the skin at bedtime. 04/28/20   Pokhrel, Corrie Mckusick, MD  lipase/protease/amylase (CREON) 36000 UNITS CPEP capsule Take 2 capsule by mouth with each meal and 1 capsule by mouth with each snack Patient taking differently: Take 36,000-72,000 Units by mouth See admin instructions. Take 72000 units by mouth with each meal and 36000 units by mouth with each snack 09/17/19   Armbruster, Carlota Raspberry, MD  metoprolol succinate (TOPROL-XL) 50 MG 24 hr tablet Take 1 tablet (50 mg total) by mouth daily. Take with or immediately following a meal. Patient taking differently: Take 50 mg by mouth daily.  12/22/19   Fay Records, MD  Multiple Vitamin (MULTIVITAMIN WITH MINERALS) TABS tablet Take 1 tablet by mouth daily. 04/28/20   Pokhrel, Corrie Mckusick, MD  Multiple Vitamins-Minerals (ADULT ONE DAILY GUMMIES) CHEW Chew 1 capsule by mouth daily.    [provider]   NARCAN 4 MG/0.1ML LIQD nasal spray kit Place 1 spray into the nose as needed (opioid overdose).  11/25/19   [provider]  nicotine (NICODERM CQ - DOSED IN MG/24 HOURS) 14 mg/24hr patch Place 1 patch (14 mg total) onto the skin daily. 04/16/20   Pokhrel, Laxman, MD  OLANZapine (ZYPREXA) 7.5 MG tablet Take 1 tablet (7.5 mg total) by mouth at bedtime. 04/28/20   Pokhrel, Corrie Mckusick, MD  ondansetron (ZOFRAN-ODT) 4 MG disintegrating tablet Take 1 tablet (4 mg total) by mouth every 8 (eight) hours as needed for nausea or vomiting (if no IV access). 04/15/20   Flora Lipps, MD  polyethylene glycol (MIRALAX / GLYCOLAX) 17 g packet Take 17 g by mouth daily as needed for moderate constipation. 04/15/20   Pokhrel, Corrie Mckusick, MD  simethicone (MYLICON) 80 MG chewable tablet Chew 1 tablet (80 mg total) by mouth 4 (four) times daily as needed (gas pain). 04/15/20   Pokhrel, Corrie Mckusick, MD    Allergies    Tramadol, Nsaids, Tolmetin, Tylenol [acetaminophen], and Aspirin  Review of Systems   Review of Systems  Constitutional: Negative for fever.  HENT: Negative for congestion.   Eyes: Negative for visual disturbance.  Respiratory: Negative for shortness of breath.   Cardiovascular: Negative for chest pain.  Gastrointestinal: Negative for abdominal pain.  Genitourinary: Negative for difficulty urinating.  Musculoskeletal: Negative for arthralgias.  Skin: Negative for rash.  Neurological: Negative for dizziness.  Psychiatric/Behavioral: Negative for agitation.  All other systems reviewed and are negative.   Physical Exam Updated Vital Signs BP (!) 183/95 (BP Location: Right Arm)   Pulse (!) 128   Temp 98.2 F (36.8 C) (Oral)   Resp 20   Ht 5' 6" (1.676 m)   Wt 79.4 kg   SpO2 94%   BMI 28.25 kg/m   Physical Exam Vitals and nursing note reviewed.  Constitutional:      General: She is not in acute distress.    Appearance: Normal appearance.  HENT:     Head: Normocephalic and atraumatic.      Nose: Nose normal.  Eyes:     Conjunctiva/sclera: Conjunctivae normal.     Pupils: Pupils are equal, round, and reactive to light.  Cardiovascular:     Rate and Rhythm: Regular rhythm. Tachycardia present.     Pulses: Normal pulses.     Heart sounds: Normal heart sounds.  Pulmonary:     Effort: Pulmonary effort is normal.     Breath sounds: Normal breath sounds.  Abdominal:     General: Abdomen is flat. Bowel sounds are normal.     Palpations: Abdomen is soft.     Tenderness: There is no abdominal tenderness. There is no guarding or rebound.  Musculoskeletal:     Cervical back: Normal range of motion and neck supple.       Legs:  Skin:    General: Skin is warm and dry.     Capillary Refill: Cap refill to the toes of the left foot < 2 sec.   Neurological:     General: No focal deficit present.     Mental Status: She is alert and oriented to person, place, and time.     Deep Tendon Reflexes: Reflexes normal.  Psychiatric:        Mood and Affect: Mood normal.        Behavior: Behavior normal.     ED Results / Procedures / Treatments   Labs (all labs ordered are listed, but only abnormal results are displayed) Results for orders placed or performed during the hospital encounter of 05/17/20  CBC  Result Value Ref Range   WBC 13.6 (H) 4.0 - 10.5 K/uL   RBC 3.99 3.87 - 5.11 MIL/uL   Hemoglobin 9.6 (L) 12.0 - 15.0 g/dL   HCT 30.6 (L) 36.0 - 46.0 %   MCV 76.7 (L) 80.0 - 100.0 fL   MCH 24.1 (L) 26.0 - 34.0 pg   MCHC 31.4 30.0 - 36.0 g/dL   RDW 24.3 (H) 11.5 - 15.5 %   Platelets 517 (H) 150 - 400 K/uL   nRBC 0.2 0.0 - 0.2 %  Blood  gas, venous (at [facility] and [facility], not at [facility])  Result Value Ref Range   pH, Ven 7.391 7.250 - 7.430   pCO2, Ven 46.7 44.0 - 60.0 mmHg   pO2, Ven <31.0 (LL) 32.0 - 45.0 mmHg   Bicarbonate 27.8 20.0 - 28.0 mmol/L   Acid-Base Excess 2.9 (H) 0.0 - 2.0 mmol/L   O2 Saturation 27.2 %   Patient temperature 98.6   CBG monitoring, ED  Result Value Ref  Range   Glucose-Capillary 428 (H) 70 - 99 mg/dL   CT ABDOMEN PELVIS WO CONTRAST  Result Date: 04/19/2020 CLINICAL DATA:  Suspected bowel obstruction. Abdominal pain and distension. EXAM: CT ABDOMEN AND PELVIS WITHOUT CONTRAST TECHNIQUE: Multidetector CT imaging of the abdomen and pelvis was performed following the standard protocol without IV contrast. COMPARISON:  Ultrasound abdomen 02/28/2020 FINDINGS: Lower chest: Trace to small right pleural effusion. Associated passive atelectasis of the right lower lobe. Linear atelectasis versus scarring within the left lower lobe and lingula. Hepatobiliary: No focal liver abnormality. No gallstones, gallbladder wall thickening, or pericholecystic fluid. No biliary dilatation. Pancreas: No focal lesion. Normal pancreatic contour. No surrounding inflammatory changes. No main pancreatic ductal dilatation. Spleen: Normal in size without focal abnormality. Adrenals/Urinary Tract: No adrenal nodule bilaterally. No nephrolithiasis, no hydronephrosis, and no contour-deforming renal mass. No ureterolithiasis or hydroureter. The urinary bladder is unremarkable. Stomach/Bowel: Stomach is within normal limits. Fluid distension of the small bowel with caliber measuring up to 3.2 cm and associated air-fluid levels. No pneumatosis noted. The small bowel slowly tapers two a normal caliber with no definite transition point noted. The distal ileum is noted to be collapsed however. No evidence of large bowel wall thickening or dilatation. Appendix appears normal. Vascular/Lymphatic: No abdominal aorta or iliac aneurysm. Mild atherosclerotic plaque of the aorta and its branches. No abdominal, pelvic, or inguinal lymphadenopathy. Reproductive: Status post hysterectomy. No adnexal masses. Other: Trace simple free fluid within the pelvis. No intraperitoneal free gas. No organized fluid collection. Musculoskeletal: Mild subcutaneus soft tissue edema. Healed anterior abdominal incision. Tiny  fat containing umbilical hernia with abdominal defect 0.8 cm. No suspicious lytic or blastic osseous lesions. No acute displaced fracture. Multilevel degenerative changes of the spine. IMPRESSION: 1. Partial/early distal small bowel obstruction with no definite transition point. Differential diagnosis includes ileus. Associated gastric distension with intraluminal fluid- please consider enteric tube placement. No associated bowel obstruction. Limited evaluation for bowel ischemia on this noncontrast study. 2. Trace to small volume right pleural effusion. 3. Nonspecific trace simple free fluid within the pelvis. Electronically Signed   By: Iven Finn M.D.   On: 04/19/2020 15:27   DG Abd 1 View  Result Date: 04/26/2020 CLINICAL DATA:  Constipation EXAM: ABDOMEN - 1 VIEW COMPARISON:  April 25, 2020 FINDINGS: Loops of dilated small bowel again noted. There is moderate stool in the colon. No air-fluid levels. No free air. Visualized lung bases clear. IMPRESSION: Dilated loops of small bowel again noted. Suspect ileus, although a degree of partial bowel obstruction cannot be excluded entirely. No free air evident on supine examination. Electronically Signed   By: Lowella Grip III M.D.   On: 04/26/2020 10:53   DG Abd 1 View  Result Date: 04/25/2020 CLINICAL DATA:  Abdominal distension EXAM: ABDOMEN - 1 VIEW COMPARISON:  04/23/2020 FINDINGS: Multiple gas distended mid abdominal small bowel loops, similar in number and overall degree of distension since previous exam. Colon remains decompressed. No abnormal abdominal calcifications. Lower lumbar spondylitic changes. IMPRESSION: Persistent small bowel distention, may  represent partial obstruction or adynamic ileus. Electronically Signed   By: Lucrezia Europe M.D.   On: 04/25/2020 08:55   DG Abd 1 View  Result Date: 04/23/2020 CLINICAL DATA:  Follow-up ileus EXAM: ABDOMEN - 1 VIEW COMPARISON:  04/22/20 FINDINGS: Persistent small bowel dilatation is noted  with paucity of colonic gas. The degree of dilated small bowel has increased in the interval from the prior exam consistent with a progressive process no bony abnormality is seen. No free air is noted. IMPRESSION: Increase in the number of dilated small bowel loops when compared with the prior exam. No free air is noted. Electronically Signed   By: Inez Catalina M.D.   On: 04/23/2020 09:36   DG Abd 1 View  Result Date: 04/22/2020 CLINICAL DATA:  Follow-up ileus EXAM: ABDOMEN - 1 VIEW COMPARISON:  04/21/20 FINDINGS: Scattered large and small bowel gas is noted. The small-bowel dilatation in the left mid abdomen is stable. Some increasing colonic gas is noted. No free air is noted. IMPRESSION: Stable small bowel dilatation. Electronically Signed   By: Inez Catalina M.D.   On: 04/22/2020 09:43   DG Abd 1 View  Result Date: 04/20/2020 CLINICAL DATA:  60 year old female status post NG placement. EXAM: ABDOMEN - 1 VIEW COMPARISON:  Earlier radiograph dated 04/20/2020. FINDINGS: Enteric tube with tip just distal to the GE junction and side port above the GE junction. Recommend further advancing by additional 10 cm. IMPRESSION: Enteric tube with tip just distal to the GE junction. Recommend further advancing by additional 10 cm. Electronically Signed   By: Anner Crete M.D.   On: 04/20/2020 15:43   DG Abd 1 View  Result Date: 04/20/2020 CLINICAL DATA:  60 year old female status post NG placement. EXAM: ABDOMEN - 1 VIEW COMPARISON:  Abdominal radiograph dated 04/20/2020. FINDINGS: Partially visualized enteric tube with side-port over distal esophagus and tip in the region of the GE junction. Recommend further advancing by at least additional 12 cm. Dilated small bowel loops noted in the abdomen. IMPRESSION: 1. Enteric tube with tip in the region of the GE junction. Recommend further advancing by at least 12 cm. 2. Dilated small bowel loops. Electronically Signed   By: Anner Crete M.D.   On: 04/20/2020  15:42   DG Abd 1 View  Result Date: 04/20/2020 CLINICAL DATA:  60 year old female with history of ileus versus small-bowel obstruction. EXAM: ABDOMEN - 1 VIEW COMPARISON:  04/19/2020 FINDINGS: Slightly decreased prominence of previously visualized, moderately distended loops of gas-filled small bowel in the mid abdomen. There is a paucity of colonic gas. No acute osseous abnormality. IMPRESSION: Slight interval decreased burden of gas-filled loops of small bowel, suggestive of resolving ileus versus partial small bowel obstruction. Electronically Signed   By: Ruthann Cancer MD   On: 04/20/2020 08:47   DG Abd 1 View  Result Date: 04/19/2020 CLINICAL DATA:  Abdominal pain.  Vomiting. EXAM: ABDOMEN - 1 VIEW COMPARISON:  CT 03/21/2020. FINDINGS: Multiple distended loops of small bowel noted. Paucity of intra colonic air noted. Small-bowel obstruction cannot be excluded. No free air. Mild degenerative changes lumbar spine and both hips. IMPRESSION: Multiple distended loops of small bowel. Paucity of intra colonic air noted. Small-bowel obstruction cannot be excluded. No free air. CT of the abdomen pelvis may prove useful for further evaluation. Electronically Signed   By: Marcello Moores  Register   On: 04/19/2020 08:32   DG Abd 2 Views  Result Date: 04/21/2020 CLINICAL DATA:  Evaluate for ileus EXAM: ABDOMEN -  2 VIEW COMPARISON:  04/26/2020 FINDINGS: Interval removal of enteric tube. Persistent dilated loop of small bowel within the left lower quadrant of the abdomen measures up to 4.3 cm. Decreased small bowel dilatation in the right lower quadrant. Increase colonic gas noted in the right hemiabdomen. IMPRESSION: Persistent but improved small bowel ileus pattern. Electronically Signed   By: Kerby Moors M.D.   On: 04/21/2020 13:45    EKG None  Radiology No results found.  Procedures Procedures (including critical care time)  Medications Ordered in ED Medications  Tdap (BOOSTRIX) injection 0.5 mL  (has no administration in time range)  vancomycin (VANCOCIN) IVPB 1000 mg/200 mL premix (has no administration in time range)  piperacillin-tazobactam (ZOSYN) IVPB 3.375 g (has no administration in time range)    ED Course  I have reviewed the triage vital signs and the nursing notes.  Pertinent labs & imaging results that were available during my care of the patient were reviewed by me and considered in my medical decision making (see chart for details).    Morgan Beasley was evaluated in Emergency Department on 05/17/2020 for the symptoms described in the history of present illness. She was evaluated in the context of the global COVID-19 pandemic, which necessitated consideration that the patient might be at risk for infection with the SARS-CoV-2 virus that causes COVID-19. Institutional protocols and algorithms that pertain to the evaluation of patients at risk for COVID-19 are in a state of rapid change based on information released by regulatory bodies including the CDC and federal and state organizations. These policies and algorithms were followed during the patient's care in the ED.  Final Clinical Impression(s) / ED Diagnoses Final diagnoses:  Gangrene ([place])  Uncontrolled type 2 diabetes mellitus with hyperglycemia ([place])   Will need admission for hyperglycemia and necrotic toes.  Signed out to Dr. Tyrone Nine pending labs and admission.     , , MD 05/17/20 [place], , MD 05/17/20 [id number]

## 2020-05-17 NOTE — H&P (Signed)
History and Physical  Morgan Beasley:[id number] DOB: 03-04-1961 DOA: 05/17/2020  Referring physician: Deno Etienne, ER Physician  PCP: Morgan Sheer, NP  Outpatient Specialists: Has seen [facility] Patient coming from: home & is able to ambulate using a walker/wheelchair  Chief Complaint: Bilateral foot pain   HPI: Morgan Beasley is a 60 y.o. female with medical history significant for bipolar disorder, systolic heart failure, diabetes mellitus uncontrolled and COPD who was discharged from the hospital 2 weeks ago for agitation, heart failure and uncontrolled diabetes mellitus.  She was discharged to a skilled nursing facility.  The exact same presentation and discharge occurred 2 weeks before that.  She presented to the emergency room on the early morning of 1/17 with complaints of bilateral foot pain and high sugars.  Patient related that she had been out of her medications now for several days (she told me several weeks) and she was found to have a CBG of 435, although not in DKA, white blood cell count of 13.6 with a normal lactic acid level.  X-rays noted cellulitis of the left toes, but right foot noted osteomyelitis of the fourth and fifth toes.  Patient started on broad-spectrum antibiotics and blood cultures were taken.  Lactic acid level normal.  Chest x-ray noted improved but not resolved interstitial edema when compared to x-rays from her previous hospitalization.  Hospitalist were called for further evaluation. Follow-up labs revealed a BNP of 2900.  Review of Systems: Patient seen in the emergency room. Pt complains of bilateral foot pain.  She has chronic lower extremity numbness.  Pt denies any headaches, vision changes, dysphagia, chest pain, palpitation, shortness of breath, wheeze, cough, abdominal pain, hematuria, dysuria, constipation, diarrhea.  Review of systems are otherwise negative   Past Medical History:  Diagnosis Date  . Alcohol  dependence ([place])   . Anemia   . Anxiety   . Breast cancer ([place])   . Chronic combined systolic and diastolic CHF (congestive heart failure) ([place])   . Cigarette nicotine dependence   . CKD (chronic kidney disease), stage III ([place])   . Colon polyps   . COPD (chronic obstructive pulmonary disease) ([place])   . Diabetes mellitus without complication ([place])   . Diabetic neuropathy ([place])   . Gout   . Hyperlipemia   . Hypertension   . Insomnia   . Lymphedema   . Marijuana use   . Mild CAD 2016   a. NSTEMI 2016 in context of cocaine abuse, 50% RCA% at that time.  . OSA treated with BiPAP   . PAD (peripheral artery disease) ([place])    a. s/p L SFA stenting 08/2019. b. left fem-to-below-knee-popliteal bypass 09/2019.  Marland Kitchen Pancreatitis    acute on chronic due to ETOH initially.    . Sleep apnea    wears BIPAP  . Ulcer of foot ([place])    right   Past Surgical History:  Procedure Laterality Date  . ABDOMINAL AORTOGRAM W/LOWER EXTREMITY N/A 09/26/2019   Procedure: ABDOMINAL AORTOGRAM W/LOWER EXTREMITY;  Surgeon: Angelia Mould, MD;  Location: [facility];  Service: Cardiovascular;  Laterality: N/A;  . ABDOMINAL AORTOGRAM W/LOWER EXTREMITY Bilateral 12/08/2019   Procedure: ABDOMINAL AORTOGRAM W/LOWER EXTREMITY;  Surgeon: Waynetta Sandy, MD;  Location: [facility];  Service: Cardiovascular;  Laterality: Bilateral;  . ABDOMINAL AORTOGRAM W/LOWER EXTREMITY Left 01/26/2020   Procedure: ABDOMINAL AORTOGRAM W/LOWER EXTREMITY;  Surgeon: Waynetta Sandy, MD;  Location: [facility];  Service:  Cardiovascular;  Laterality: Left;  . ABDOMINAL HYSTERECTOMY    . BUBBLE STUDY  03/04/2020   Procedure: BUBBLE STUDY;  Surgeon: Elouise Munroe, MD;  Location: [facility] ENDOSCOPY;  Service: Cardiology;;  . [place] [facility]  . FEMORAL-POPLITEAL BYPASS GRAFT Left 10/14/2019   Procedure: BYPASS GRAFT LEFT FEMORAL-POPLITEAL ARTERY USING NONREVERSED SAPHENOUS VEIN;   Surgeon: Waynetta Sandy, MD;  Location: [facility];  Service: Vascular;  Laterality: Left;  Marland Kitchen MASTECTOMY Right April 2016  . PERIPHERAL VASCULAR ATHERECTOMY Left 01/26/2020   Procedure: PERIPHERAL VASCULAR ATHERECTOMY;  Surgeon: Waynetta Sandy, MD;  Location: [facility];  Service: Cardiovascular;  Laterality: Left;  PT and AT - [facility]  . PERIPHERAL VASCULAR BALLOON ANGIOPLASTY Left 01/26/2020   Procedure: PERIPHERAL VASCULAR BALLOON ANGIOPLASTY;  Surgeon: Waynetta Sandy, MD;  Location: [facility];  Service: Cardiovascular;  Laterality: Left;  TP Trunk   . PERIPHERAL VASCULAR INTERVENTION Left 09/26/2019   Procedure: PERIPHERAL VASCULAR INTERVENTION;  Surgeon: Angelia Mould, MD;  Location: [facility];  Service: Cardiovascular;  Laterality: Left;  superficial femoral  . TEE WITHOUT CARDIOVERSION N/A 03/04/2020   Procedure: TRANSESOPHAGEAL ECHOCARDIOGRAM (TEE);  Surgeon: Elouise Munroe, MD;  Location: [facility];  Service: Cardiology;  Laterality: N/A;    Social History:  reports that she has been smoking cigarettes. She has never used smokeless tobacco. She reports previous alcohol use. She reports previous drug use. Drug: Marijuana. (To me, she is denying tobacco, drugs or alcohol use).  Ambulates with use of wheelchair or walker   Allergies  Allergen Reactions  . Tramadol Swelling  . Nsaids Other (See Comments)    Pancreatitis  . Tolmetin Other (See Comments)    Pancreatitis  . Tylenol [Acetaminophen] Other (See Comments)    unknown  . Aspirin Other (See Comments)    "Makes my pancreas act up"     Family History  Problem Relation Age of Onset  . Diabetes Other   . Heart disease Other   . Breast cancer Maternal Grandmother   . Breast cancer Paternal Grandmother   . Stroke Son   . Colon cancer Neg Hx   . Esophageal cancer Neg Hx   . Rectal cancer Neg Hx   . Stomach cancer Neg Hx       Prior to Admission medications    Medication Sig Start Date End Date Taking? Authorizing Provider  acetaminophen (TYLENOL) 325 MG tablet Take 2 tablets (650 mg total) by mouth every 6 (six) hours. Patient not taking: No sig reported 04/15/20   Pokhrel, Corrie Mckusick, MD  albuterol (VENTOLIN HFA) 108 (90 Base) MCG/ACT inhaler Inhale 2 puffs into the lungs every 6 (six) hours as needed for wheezing or shortness of breath.  Patient not taking: Reported on 05/17/2020 05/19/14   [provider]  amitriptyline (ELAVIL) 100 MG tablet Take 100 mg by mouth at bedtime.  Patient not taking: Reported on 05/17/2020    [provider]  atorvastatin (LIPITOR) 20 MG tablet Take 1 tablet (20 mg total) by mouth daily. Patient not taking: Reported on 05/17/2020 03/19/20 04/18/20  Arrien, Jimmy Picket, MD  budesonide-formoterol [facility]) 160-4.5 MCG/ACT inhaler Inhale 2 puffs into the lungs 2 (two) times daily. Patient not taking: Reported on 05/17/2020    [provider]  busPIRone (BUSPAR) 10 MG tablet Take 1 tablet (10 mg total) by mouth 3 (three) times daily. Patient not taking: No sig reported 04/15/20   Flora Lipps, MD  carbamazepine (TEGRETOL) 200 MG tablet Take 2 tablets (400 mg total) by mouth 2 (two) times daily. Patient not taking: No sig reported 04/15/20   Pokhrel, Corrie Mckusick, MD  clopidogrel (PLAVIX) 75 MG tablet Take 1 tablet (75 mg total) by mouth daily. Patient not taking: Reported on 05/17/2020 09/27/19 09/26/20  Lyndee Hensen, DO  dicyclomine (BENTYL) 10 MG capsule Take 1 capsule (10 mg total) by mouth 4 (four) times daily -  before meals and at bedtime. Patient not taking: No sig reported 04/28/20   Pokhrel, Corrie Mckusick, MD  docusate sodium (COLACE) 100 MG capsule Take 1 capsule (100 mg total) by mouth 2 (two) times daily as needed for mild constipation or moderate constipation. Patient not taking: No sig reported 04/15/20   Pokhrel, Corrie Mckusick, MD  feeding supplement, GLUCERNA SHAKE, (GLUCERNA SHAKE) LIQD Take 237  mLs by mouth 2 (two) times daily between meals. Patient not taking: No sig reported 04/28/20   Pokhrel, Corrie Mckusick, MD  furosemide (LASIX) 40 MG tablet Take 1 tablet (40 mg total) by mouth daily. Patient not taking: Reported on 05/17/2020 03/19/20 04/18/20  Arrien, Jimmy Picket, MD  hydrocortisone cream 1 % Apply 1 application topically 3 (three) times daily as needed for itching (minor skin irritation). Patient not taking: No sig reported 04/15/20   Pokhrel, Corrie Mckusick, MD  insulin aspart (NOVOLOG) 100 UNIT/ML injection Inject 0-6 Units into the skin 3 (three) times daily with meals. Patient not taking: No sig reported 04/15/20   Pokhrel, Corrie Mckusick, MD  insulin glargine (LANTUS) 100 UNIT/ML injection Inject 0.08 mLs (8 Units total) into the skin at bedtime. Patient not taking: No sig reported 04/28/20   Pokhrel, Corrie Mckusick, MD  lipase/protease/amylase (CREON) 36000 UNITS CPEP capsule Take 2 capsule by mouth with each meal and 1 capsule by mouth with each snack Patient not taking: Reported on 05/17/2020 09/17/19   Armbruster, Carlota Raspberry, MD  metoprolol succinate (TOPROL-XL) 50 MG 24 hr tablet Take 1 tablet (50 mg total) by mouth daily. Take with or immediately following a meal. Patient not taking: Reported on 05/17/2020 12/22/19   Fay Records, MD  Multiple Vitamin (MULTIVITAMIN WITH MINERALS) TABS tablet Take 1 tablet by mouth daily. Patient not taking: No sig reported 04/28/20   Pokhrel, Corrie Mckusick, MD  Multiple Vitamins-Minerals (ADULT ONE DAILY GUMMIES) CHEW Chew 1 capsule by mouth daily. Patient not taking: Reported on 05/17/2020    [provider]  NARCAN 4 MG/0.1ML LIQD nasal spray kit Place 1 spray into the nose as needed (opioid overdose).  Patient not taking: Reported on 05/17/2020 11/25/19   [provider]  nicotine (NICODERM CQ - DOSED IN MG/24 HOURS) 14 mg/24hr patch Place 1 patch (14 mg total) onto the skin daily. Patient not taking: No sig reported 04/16/20   Pokhrel, Laxman, MD   OLANZapine (ZYPREXA) 7.5 MG tablet Take 1 tablet (7.5 mg total) by mouth at bedtime. Patient not taking: No sig reported 04/28/20   Pokhrel, Corrie Mckusick, MD  ondansetron (ZOFRAN-ODT) 4 MG disintegrating tablet Take 1 tablet (4 mg total) by mouth every 8 (eight) hours as needed for nausea or vomiting (if no IV access). Patient not taking: No sig reported 04/15/20   Pokhrel, Corrie Mckusick, MD  polyethylene glycol (MIRALAX / GLYCOLAX) 17 g packet Take 17 g by mouth daily as needed for moderate constipation. Patient not taking: No sig reported 04/15/20   Pokhrel, Corrie Mckusick, MD  simethicone (MYLICON) 80 MG chewable tablet Chew 1 tablet (80 mg total) by mouth 4 (four) times daily as needed (gas  pain). Patient not taking: No sig reported 04/15/20   Flora Lipps, MD    Physical Exam: BP (!) 130/93   Pulse (!) 130   Temp 98.2 F (36.8 C) (Oral)   Resp 15   Ht $R'5\' 6"'Xn$  (1.676 m)   Wt 79.4 kg   SpO2 (!) 82%   BMI 28.25 kg/m   . General: Alert and oriented x3, moderate distress secondary to bilateral foot pain . Eyes: Sclera nonicteric, extraocular movements are intact . ENT: Normocephalic and atraumatic, mucous membranes slightly dry . Neck: Supple, no JVD . Cardiovascular: Regular rhythm, moderate tachycardia, occasional ectopic beat . Respiratory: Clear to auscultation . Abdomen: Soft, nontender, nondistended, positive bowel sounds . Skin: Dry gangrene of the fourth and fifth toes along with multiple ulcerations on bilateral lower extremities. . Musculoskeletal: No clubbing or cyanosis, 1+ pitting edema bilaterally . Psychiatric: Appropriate, no evidence of psychoses . Neurologic: Decreased lower extremity sensation          Labs on Admission:  Basic Metabolic Panel: Recent Labs  Lab 05/17/20 0607 05/17/20 0645  NA 132* 131*  K 3.9 3.7  CL 95* 95*  CO2 25  --   GLUCOSE 435* 426*  BUN 14 12  CREATININE 1.09* 0.90  CALCIUM 8.3*  --    Liver Function Tests: No results for input(s): AST,  ALT, ALKPHOS, BILITOT, PROT, ALBUMIN in the last 168 hours. No results for input(s): LIPASE, AMYLASE in the last 168 hours. No results for input(s): AMMONIA in the last 168 hours. CBC: Recent Labs  Lab 05/17/20 0607 05/17/20 0645  WBC 13.6*  --   HGB 9.6* 12.6  HCT 30.6* 37.0  MCV 76.7*  --   PLT 517*  --    Cardiac Enzymes: No results for input(s): CKTOTAL, CKMB, CKMBINDEX, TROPONINI in the last 168 hours.  BNP (last 3 results) Recent Labs    03/21/20 0748 03/24/20 0156 05/17/20 0801  BNP 1,352.8* 989.2* 2,905.7*    ProBNP (last 3 results) Recent Labs    12/19/19 1546 02/09/20 0825  PROBNP 1,148* 1,185*    CBG: Recent Labs  Lab 05/17/20 0135 05/17/20 0758  GLUCAP 428* 347*    Radiological Exams on Admission: DG Chest 2 View  Result Date: 05/17/2020 CLINICAL DATA:  60 year old female with lower extremity open wounds. Chest congestion. Preoperative study. EXAM: CHEST - 2 VIEW COMPARISON:  Portable chest 03/23/2020 and earlier. FINDINGS: Semi upright AP and lateral views of the chest. Stable cardiomegaly and mediastinal contours. Visualized tracheal air column is within normal limits. No pneumothorax. No pleural effusion. Regressed although not completely resolved bilateral pulmonary interstitial opacity compared to November. Chronic linear scarring at the left mid lung is stable. No consolidation. No acute osseous abnormality identified. Negative visible bowel gas pattern. IMPRESSION: Improved although not resolved bilateral pulmonary interstitial opacity compared to November. Mild or developing pulmonary interstitial edema is possible. Viral/atypical respiratory infection felt less likely. No pleural effusion or consolidation. Electronically Signed   By: Genevie Ann M.D.   On: 05/17/2020 07:20   DG Foot Complete Left  Result Date: 05/17/2020 CLINICAL DATA:  60 year old female with open wounds.  Gangrene. EXAM: LEFT FOOT - COMPLETE 3+ VIEW COMPARISON:  Left foot series  02/23/2020. FINDINGS: Increased soft tissue swelling since October in the distal foot. No soft tissue gas. Calcified peripheral vascular disease. Stable osteopenia in the 2nd through 5th metatarsal heads. No cortical osteolysis identified. No acute fracture or dislocation. Bulky calcaneus degenerative spurring is stable. IMPRESSION: 1. Increased soft  tissue swelling since October but no acute osseous abnormality or plain radiographic evidence of osteomyelitis. 2. Calcified peripheral vascular disease. Electronically Signed   By: Genevie Ann M.D.   On: 05/17/2020 07:14   DG Foot Complete Right  Result Date: 05/17/2020 CLINICAL DATA:  60 year old female with open wounds. Gangrene. EXAM: RIGHT FOOT COMPLETE - 3+ VIEW COMPARISON:  Right tib fib series 04/10/2020. FINDINGS: Moderate soft tissue swelling in the distal foot. No soft tissue gas. There is cortical osteolysis at the medial head of the 5th metatarsal (arrow). Adjacent base of the 5th proximal phalanx appears to remain intact. Other metatarsal heads appear intact. No superimposed acute fracture or dislocation identified. Chronic bulky calcaneus degenerative spurring. Subtle loss of the cortex also at the anterior inferior calcaneus and adjacent tarsal bone, perhaps the lateral cuneiform. No additional cortical osteolysis identified. IMPRESSION: 1. Positive for evidence of Osteomyelitis at the medial head of the 5th metatarsal, and possibly also at the anterior inferior calcaneus and adjacent cuneiform (probably lateral cuneiform). 2. Moderate soft tissue swelling. No soft tissue gas. Electronically Signed   By: Genevie Ann M.D.   On: 05/17/2020 07:18   [place] EKG SITE RITE  Result Date: 05/17/2020 If Site Rite image not attached, placement could not be confirmed due to current cardiac rhythm.   EKG: Independently reviewed. Normal sinus rhythm   Assessment/Plan Present on Admission: . Benign essential HTN . Chronic systolic CHF (congestive heart failure)  ([place]) . COPD (chronic obstructive pulmonary disease) ([place]) . Obstructive sleep apnea (adult) (pediatric) . PVD (peripheral vascular disease) ([place]) . Tobacco dependence . Osteomyelitis of ankle or foot, right, acute ([place]) . Sepsis ([place]) . Polysubstance abuse ([place])  Principal Problem:   Sepsis ([place]) secondary to acute osteomyelitis of right foot: Checking MRI.  Patient meets criteria for sepsis on admission given SIRS criteria.  Blood cultures are pending.  Blood pressure is now stable.  I have started IV vancomycin and Rocephin.  Given very poor compliance, I have a strong suspicion she will end up getting an amputation. Active Problems:   COPD (chronic obstructive pulmonary disease) ([place]): Stable at this time.   PVD (peripheral vascular disease) (H[facility]) Acute on chronic systolic CHF (congestive heart failure) ([place]): BNP markedly elevated, although checked after patient received fluid resuscitation.  We will go ahead and start IV Lasix.  Borderline hypoxia with occasionally oxygen saturations dropping to 89%.  Will place 2 L.   Benign essential HTN   Obstructive sleep apnea (adult) (pediatric)   Uncontrolled type 2 diabetes mellitus with hyperglycemia, with long-term current use of insulin [facility]): Currently with osteomyelitis.  She tells me that she has not been on her insulin should she got out of the skilled nursing facility now for several weeks.  It is unclear if she is being truthful.  Nevertheless, she is currently hyperglycemic.  She is not in DKA fortunately.  I suspect her elevated sugars from lack of taking insulin and mild infection.  Given normal renal function, I do not think that she needs any more fluid resuscitation.  Have started insulin and every 4 hours sliding scale.  She has a history of peripheral neuropathy and have started Neurontin.    Tobacco dependence: Patient tells me that she is does not smoke currently.    Non-compliance: Patient was just discharged 2 weeks ago to a  skilled nursing facility.  She tells me that after getting out of the facility, her daughter has not given any of her medications.  It is  certainly possible, patient could be misleading.  She does appear to be within her right mind at this time.   Polysubstance abuse ([place])   DVT prophylaxis: Lovenox   Code Status: Full   Family Communication: Pt explicitly told me not to talk to her daughter   Disposition Plan: Anticipate patient will likely need amputation.  There is also a matter of disposition.  Patient tells me that her daughter has been holding her medicines back from her.  I told the patient it may not be safe for her to live with her daughter anymore and may have to look at a facility for her to live.  The patient seems to be okay with this plan.  Consults called: Orthopedic surgery  Admission status: Given expectation patient will be surpassed 2 midnights, admit as inpatient  COVID status: Patient states she is never had COVID.  She states that she has gotten the vaccine.  She was not sure if she is got the booster.  Annita Brod MD [facility] Pager [phone]  If 7PM-7AM, please contact night-coverage [email or web address] Password [facility]  05/17/2020, 12:31 PM

## 2020-05-17 NOTE — ED Notes (Signed)
Pt to MRI

## 2020-05-17 NOTE — ED Notes (Signed)
Charlesetta Ivory, RN to give report. Levada Dy, RN stated that we will do bedside report

## 2020-05-17 NOTE — ED Triage Notes (Signed)
Pt arrives from home with [facility] complaining of hyperglycemia. States that her sugar was 555 at home. Also reports bilateral foot pain and swelling.

## 2020-05-17 NOTE — ED Notes (Signed)
Pt heard screaming out for help.  This writer found IV to be infiltrated.  Pump shut off, lines locked, and warm compress placed.  Primary RN made aware.

## 2020-05-17 NOTE — Progress Notes (Signed)
Pharmacy Antibiotic Note  Morgan Beasley is a 60 y.o. female admitted on 05/17/2020 with cellulitis.  Pharmacy has been consulted for vancomycin  dosing.  Plan:  Vancomycin 1000 mg IV x1 given in ED, then vancomycin 1000 mg IV q12h   Ceftriaxone 2 gr IV q24h per MD   Monitor clinical course, renal function, cultures as available     Height: 5\' 6"  (167.6 cm) Weight: 79.4 kg (175 lb) IBW/kg (Calculated) : 59.3  Temp (24hrs), Avg:98.2 F (36.8 C), Min:98.2 F (36.8 C), Max:98.2 F (36.8 C)  Recent Labs  Lab 05/17/20 0607 05/17/20 0608 05/17/20 0639 05/17/20 0645  WBC 13.6*  --   --   --   CREATININE 1.09*  --   --  0.90  LATICACIDVEN  --  1.6 1.9  --     Estimated Creatinine Clearance: 72.4 mL/min (by C-G formula based on SCr of 0.9 mg/dL).    Allergies  Allergen Reactions  . Tramadol Swelling  . Nsaids Other (See Comments)    Pancreatitis  . Tolmetin Other (See Comments)    Pancreatitis  . Tylenol [Acetaminophen] Other (See Comments)    unknown  . Aspirin Other (See Comments)    "Makes my pancreas act up"     Antimicrobials this admission: 1/17 Zosyn x 1  1/17 vancomycin >>  1/17 ceftriaxone >>   Dose adjustments this admission:    Microbiology results: 1/17 BCx:  1/17 resp panel: negative     Thank you for allowing pharmacy to be a part of this patient's care.  Royetta Asal, PharmD, BCPS 05/17/2020 12:52 PM

## 2020-05-17 NOTE — ED Provider Notes (Signed)
60 year old female I received in signout from Dr. Nicholes Stairs, please see her note for full history and physical.  Briefly the patient has had issues controlling her blood sugar over the past few days.  Complaining of aches all over her body.  Found to have two toes that are necrotic to the right foot.  Plan for admission post lab work and plain film of the foot.  X-rays consistent with osteomyelitis.  She was started on broad-spectrum antibiotics.  Hyperglycemic with acidosis.  Given a bolus of insulin here.  Fluid overloaded and so held off on fluids.  Will discuss with medicine for admission.   Deno Etienne, DO 05/17/20 1204

## 2020-05-17 NOTE — Progress Notes (Signed)
Recieved pt from ED. Multiple ulceration on bilateral lower extremities. Some healed. Right foot second toe and 5th toe with necrosis. Right heal up lower leg with new abrasions. [place] nurse consult placed. Cont with plan of care

## 2020-05-17 NOTE — ED Notes (Addendum)
Patient in distress and belly breathing. Patient's room sat was 78% on RA, patient placed on 2L [place] and came up to 86%. Patient placed on 5L [place] and came up to 94%. MD messaged.

## 2020-05-17 NOTE — Progress Notes (Signed)
Inpatient Diabetes Program Recommendations  AACE/ADA: New Consensus Statement on Inpatient Glycemic Control (2015)  Target Ranges:  Prepandial:   less than 140 mg/dL      Peak postprandial:   less than 180 mg/dL (1-2 hours)      Critically ill patients:  140 - 180 mg/dL   Lab Results  Component Value Date   GLUCAP 362 (H) 05/17/2020   HGBA1C 15.1 (H) 02/23/2020    Review of Glycemic Control  Diabetes history: DM2 Outpatient Diabetes medications: Lantus 8 units QHS, Novolog 0-6 units tidwc (not taking) Current orders for Inpatient glycemic control: Lantus 5 units QD  HgbA1C - 15.1%  Inpatient Diabetes Program Recommendations:     Add Novolog 0-9 units tidwc and hs  Will speak with pt about diabetes control and HgbA1C of 15.1% when appropriate.  Secure text to MD.  Thank you. Lorenda Peck, RD, LDN, CDE Inpatient Diabetes Coordinator [phone]

## 2020-05-18 ENCOUNTER — Inpatient Hospital Stay: Payer: Self-pay

## 2020-05-18 ENCOUNTER — Encounter (HOSPITAL_COMMUNITY): Payer: Medicare Other

## 2020-05-18 ENCOUNTER — Inpatient Hospital Stay (HOSPITAL_COMMUNITY): Payer: Medicare Other

## 2020-05-18 DIAGNOSIS — I70263 Atherosclerosis of native arteries of extremities with gangrene, bilateral legs: Secondary | ICD-10-CM

## 2020-05-18 DIAGNOSIS — I96 Gangrene, not elsewhere classified: Secondary | ICD-10-CM

## 2020-05-18 DIAGNOSIS — I739 Peripheral vascular disease, unspecified: Secondary | ICD-10-CM

## 2020-05-18 LAB — BASIC METABOLIC PANEL
Anion gap: 9 (ref 5–15)
BUN: 18 mg/dL (ref 6–20)
CO2: 24 mmol/L (ref 22–32)
Calcium: 8.1 mg/dL — ABNORMAL LOW (ref 8.9–10.3)
Chloride: 97 mmol/L — ABNORMAL LOW (ref 98–111)
Creatinine, Ser: 1.77 mg/dL — ABNORMAL HIGH (ref 0.44–1.00)
GFR, Estimated: 33 mL/min — ABNORMAL LOW (ref >60)
Glucose, Bld: 244 mg/dL — ABNORMAL HIGH (ref 70–99)
Potassium: 4.9 mmol/L (ref 3.5–5.1)
Sodium: 130 mmol/L — ABNORMAL LOW (ref 135–145)

## 2020-05-18 LAB — PROTIME-INR
INR: 1.3 — ABNORMAL HIGH (ref 0.8–1.2)
Prothrombin Time: 15.5 seconds — ABNORMAL HIGH (ref 11.4–15.2)

## 2020-05-18 LAB — CBC
HCT: 29.9 % — ABNORMAL LOW (ref 36.0–46.0)
Hemoglobin: 9.1 g/dL — ABNORMAL LOW (ref 12.0–15.0)
MCH: 23.8 pg — ABNORMAL LOW (ref 26.0–34.0)
MCHC: 30.4 g/dL (ref 30.0–36.0)
MCV: 78.1 fL — ABNORMAL LOW (ref 80.0–100.0)
Platelets: 479 10*3/uL — ABNORMAL HIGH (ref 150–400)
RBC: 3.83 MIL/uL — ABNORMAL LOW (ref 3.87–5.11)
RDW: 24.5 % — ABNORMAL HIGH (ref 11.5–15.5)
WBC: 11.3 10*3/uL — ABNORMAL HIGH (ref 4.0–10.5)
nRBC: 0.4 % — ABNORMAL HIGH (ref 0.0–0.2)

## 2020-05-18 LAB — BLOOD GAS, ARTERIAL
Acid-Base Excess: 0.8 mmol/L (ref 0.0–2.0)
Bicarbonate: 23 mmol/L (ref 20.0–28.0)
O2 Saturation: 99 %
Patient temperature: 98.6
pCO2 arterial: 29.1 mmHg — ABNORMAL LOW (ref 32.0–48.0)
pH, Arterial: 7.51 — ABNORMAL HIGH (ref 7.350–7.450)
pO2, Arterial: 125 mmHg — ABNORMAL HIGH (ref 83.0–108.0)

## 2020-05-18 LAB — GLUCOSE, CAPILLARY
Glucose-Capillary: 243 mg/dL — ABNORMAL HIGH (ref 70–99)
Glucose-Capillary: 162 mg/dL — ABNORMAL HIGH (ref 70–99)
Glucose-Capillary: 141 mg/dL — ABNORMAL HIGH (ref 70–99)
Glucose-Capillary: 90 mg/dL (ref 70–99)

## 2020-05-18 LAB — PROCALCITONIN: Procalcitonin: 0.24 ng/mL

## 2020-05-18 MED ORDER — SODIUM CHLORIDE 0.9% FLUSH
10.0000 mL | Freq: Two times a day (BID) | INTRAVENOUS | Status: DC
  Administered 2020-05-19 – 2020-05-24 (×2): 10 mL

## 2020-05-18 MED ORDER — HEPARIN SODIUM (PORCINE) 5000 UNIT/ML IJ SOLN
5000.0000 [IU] | Freq: Three times a day (TID) | INTRAMUSCULAR | Status: DC
  Administered 2020-05-18 – 2020-05-21 (×9): 5000 [IU] via SUBCUTANEOUS
  Filled 2020-05-18 (×9): qty 1

## 2020-05-18 MED ORDER — ATORVASTATIN CALCIUM 20 MG PO TABS
20.0000 mg | ORAL_TABLET | Freq: Every day | ORAL | Status: DC
  Administered 2020-05-18 – 2020-05-19 (×2): 20 mg via ORAL
  Filled 2020-05-18 (×2): qty 1

## 2020-05-18 MED ORDER — METOPROLOL SUCCINATE ER 50 MG PO TB24
50.0000 mg | ORAL_TABLET | Freq: Every day | ORAL | Status: DC
  Administered 2020-05-18: 50 mg via ORAL
  Filled 2020-05-18: qty 1

## 2020-05-18 MED ORDER — SODIUM CHLORIDE 0.9% FLUSH: 10.0000 mL | INTRAVENOUS | Status: DC | PRN

## 2020-05-18 MED ORDER — VANCOMYCIN HCL 750 MG/150ML IV SOLN
750.0000 mg | INTRAVENOUS | Status: DC
  Filled 2020-05-18: qty 150

## 2020-05-18 MED ORDER — SODIUM CHLORIDE 0.9 % IV SOLN
2.0000 g | Freq: Two times a day (BID) | INTRAVENOUS | Status: DC
  Administered 2020-05-18 (×2): 2 g via INTRAVENOUS
  Filled 2020-05-18 (×4): qty 2

## 2020-05-18 MED ORDER — MUPIROCIN CALCIUM 2 % EX CREA
TOPICAL_CREAM | Freq: Every day | CUTANEOUS | Status: DC
  Filled 2020-05-18: qty 15

## 2020-05-18 MED ORDER — GABAPENTIN 300 MG PO CAPS
300.0000 mg | ORAL_CAPSULE | Freq: Three times a day (TID) | ORAL | Status: DC
Start: 2020-05-18 — End: 2020-05-18
  Filled 2020-05-18: qty 1

## 2020-05-18 MED ORDER — CHLORHEXIDINE GLUCONATE CLOTH 2 % EX PADS
6.0000 | MEDICATED_PAD | Freq: Every day | CUTANEOUS | Status: DC
  Administered 2020-05-18 – 2020-05-27 (×9): 6 via TOPICAL

## 2020-05-18 NOTE — Progress Notes (Signed)
Pharmacy Antibiotic Note  Morgan Beasley is a 60 y.o. female admitted on 05/17/2020 with cellulitis/osteomyelitis. 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 Scr jumped from 0.9>1.77 today. Pharmacy has been consulted for vancomycin/cefepime dosing.  Plan:  Decrease vancomycin to 750mg  IV q24h   Start cefepime 2g IV q12h   Discontinue ceftriaxone 2g IV q24h   Monitor clinical course, renal function, cultures as available   Height: 5\' 6"  (167.6 cm) Weight: 89.4 kg (197 lb 1.5 oz) IBW/kg (Calculated) : 59.3  Temp (24hrs), Avg:98.5 F (36.9 C), Min:97.9 F (36.6 C), Max:98.9 F (37.2 C)  Recent Labs  Lab 05/17/20 0607 05/17/20 0608 05/17/20 0639 05/17/20 0645 05/18/20 0950  WBC 13.6*  --   --   --  11.3*  CREATININE 1.09*  --   --  0.90 1.77*  LATICACIDVEN  --  1.6 1.9  --   --     Estimated Creatinine Clearance: 39 mL/min (A) (by C-G formula based on SCr of 1.77 mg/dL (H)).    Allergies  Allergen Reactions  . Tramadol Swelling  . Nsaids Other (See Comments)    Pancreatitis  . Tolmetin Other (See Comments)    Pancreatitis  . Tylenol [Acetaminophen] Other (See Comments)    unknown  . Aspirin Other (See Comments)    "Makes my pancreas act up"     Antimicrobials this admission: 1/18 cefepime >> 1/17 Zosyn x 1  1/17 vancomycin >>  1/17 ceftriaxone >> 1/18   Dose adjustments this admission: 1/18 vanc decreased to 750mg  q24h   Microbiology results: 1/17 BCx: NGTD 1/17 resp panel: negative     Thank you for allowing pharmacy to be a part of this patient's care.  Durward Fortes, PharmD Student

## 2020-05-18 NOTE — Plan of Care (Signed)

## 2020-05-18 NOTE — Progress Notes (Signed)
Order placed from floor by RN for new IV access. Pt has an order for a PICC line on 1/17 but than cancelled & I cannot discern as to why. RN to call MD and reorder if he still wants PICC line due to unable to give IV access for pt for her antibiotics.

## 2020-05-18 NOTE — Progress Notes (Signed)
Per Gala Lewandowsky MD okayed by Renal doctor Melvia Heaps) to place PICC

## 2020-05-18 NOTE — Progress Notes (Signed)
Peripherally Inserted Central Catheter Placement  The IV Nurse has discussed with the patient and/or persons authorized to consent for the patient, the purpose of this procedure and the potential benefits and risks involved with this procedure.  The benefits include less needle sticks, lab draws from the catheter, and the patient may be discharged home with the catheter. Risks include, but not limited to, infection, bleeding, blood clot (thrombus formation), and puncture of an artery; nerve damage and irregular heartbeat and possibility to perform a PICC exchange if needed/ordered by physician.  Alternatives to this procedure were also discussed.  Bard Power PICC patient education guide, fact sheet on infection prevention and patient information card has been provided to patient /or left at bedside.    PICC Placement Documentation  PICC Double Lumen 05/18/20 PICC Left Brachial 42 cm 0 cm (Active)  Indication for Insertion or Continuance of Line Limited venous access - need for IV therapy >5 days (PICC only) 05/18/20 1730  Exposed Catheter (cm) 0 cm 05/18/20 1730  Site Assessment Clean;Dry;Intact 05/18/20 1730  Lumen #1 Status Flushed;Blood return noted 05/18/20 1730  Lumen #2 Status Flushed;Blood return noted 05/18/20 1730  Dressing Type Transparent 05/18/20 1730  Dressing Status Clean;Dry;Intact 05/18/20 1730  Antimicrobial disc in place? Yes 05/18/20 1730  Dressing Intervention New dressing 05/18/20 1730  Dressing Change Due 05/25/20 05/18/20 1730    Telephone consent by daughter     [name] 05/18/2020, 5:31 PM

## 2020-05-18 NOTE — Progress Notes (Signed)
Left lower extremity arterial duplex has been completed. Preliminary results can be found in CV Proc through chart review.  The study was limited due to the patient's somnolence, and intermittent agitation. The patient would not respond to verbal stimulus, but would occasionally awaken long enough to physically shift, and readjust in bed.  Results were given to Dr. Scot Dock.  05/18/20 1:58 PM Morgan Beasley RVT

## 2020-05-18 NOTE — Consult Note (Addendum)
[employer] Nurse Consult Note: Reason for Consult: Consult requested for BLE. According to progress notes; ortho consult is pending.  Pt has an Unstageable pressure injury to the right posterior ankle and heel and gangrene to 2nd and 5th toes on the right foot.  Topical treatment will not be effective to promote healing to these locations; please refer to ortho service for further plan of care.  Bilat anterior and posterior legs with scattered areas of dry pink scar tissue from previous wounds which have healed; no topical treatment is necessary for these sites.  Left anterior foot with full thickness wound; 2X4X.2cm, dry yellow wound bed, no odor or drainage.   Wound type: Right heel Unstageable pressure injury; 4X7cm, this extends to posterior ankle; 10X5.  Entire locations are dry eschar without odor, drainage, or fluctuance. Dry black eschar to 2nd and 5th toes. Right knee with dry scab full thickness wound; 2X1cm, no odor or drainage Left knee with dry scab full thickness wound; 2.5X1cm, no odor or drainage Pressure Injury POA: Yes Dressing procedure/placement/frequency: Float heels to reduce pressure. Topical treatment orders provided for bedside nurses to perform as follows to promote moist healing: Apply Bactroban to left and right knees and left anterior foot, Q day then cover with foam dressings.  (Change foam dressing Q 3 days or PRN soiling.) Please re-consult if further assistance is needed.  Thank-you,  Julien Girt MSN, [place], [place], [place], [place]

## 2020-05-18 NOTE — Consult Note (Signed)
REASON FOR CONSULT:    PVD Osteo.  The consult is requested by Dr. Maylene Beasley.  ASSESSMENT & PLAN:   PERIPHERAL VASCULAR DISEASE/GANGRENE BILATERAL LOWER EXTREMITIES: This patient has gangrene of both lower extremities as documented below with osteomyelitis of the left fifth metatarsal and calcaneus.  She has evidence of infrainguinal arterial occlusive disease bilaterally.  Given her diabetes, chronic kidney disease, and severe peripheral vascular disease she is at high risk for limb loss.  I have ordered ABIs and also a duplex of her left femoropopliteal bypass graft.  Currently she is lethargic and would need to show some improvement clinically before we could consider proceeding with arteriography.  If she does improve clinically then we can try to arrange for transfer to [facility] for arteriography later in the week.  ABIs and a lower extremity arterial duplex have been ordered but are pending.  I have spoken with the patient's daughter Morgan Beasley) on the phone and explained that clearly she is at high risk for limb loss.  I explained that if she does improve clinically we could consider transfer to [facility] for arteriography later in the week.   Morgan Mayo, MD Office: [phone]   HPI:   Morgan Beasley is a pleasant 60 y.o. female, who was admitted yesterday with wounds on both feet.  The patient according to the records has a history of bipolar disorder, chronic systolic congestive heart failure, uncontrolled diabetes, and COPD.  She had been at a skilled nursing facility.  She came in with foot pain and high blood sugars.  She was started on broad-spectrum antibiotics.  She has previously had vascular work by our practice and vascular surgery was consulted.  The patient was last seen in our office on 01/16/2020 by Dr. Donzetta Beasley.  She had undergone a previous left femoral to below knee popliteal artery bypass with a vein graft for a wound on her left foot.  This was done by Dr. Donzetta Beasley on  10/14/2019.  At that time she had a persistent wound on her left foot.  At that time her duplex of her graft showed monophasic flow in her graft and the distal anastomosis could not be visualized.  ABI was 91% on the left with a monophasic signal and this was likely falsely elevated.  On the right ABI was 53% monophasic.  On 01/26/2020, the patient underwent laser atherectomy of the left tibial peroneal trunk, posterior tibial artery, and anterior tibial artery.  Patient also had balloon angioplasty of the tibial peroneal trunk, posterior tibial artery, and anterior tibial artery.  Today, the patient is somewhat lethargic and I am unable to obtain any meaningful history.  She cannot tell me how long she has had the wounds on her feet.  She has extensive wounds on both feet.   Past Medical History:  Diagnosis Date  . Alcohol dependence ([place])   . Anemia   . Anxiety   . Breast cancer ([place])   . Chronic combined systolic and diastolic CHF (congestive heart failure) ([place])   . Cigarette nicotine dependence   . CKD (chronic kidney disease), stage III ([place])   . Colon polyps   . COPD (chronic obstructive pulmonary disease) ([place])   . Diabetes mellitus without complication ([place])   . Diabetic neuropathy ([place])   . Gout   . Hyperlipemia   . Hypertension   . Insomnia   . Lymphedema   . Marijuana use   . Mild CAD 2016   a. NSTEMI 2016 in context  of cocaine abuse, 50% RCA% at that time.  . OSA treated with BiPAP   . PAD (peripheral artery disease) ([place])    a. s/p L SFA stenting 08/2019. b. left fem-to-below-knee-popliteal bypass 09/2019.  [place] Pancreatitis    acute on chronic due to ETOH initially.    . Sleep apnea    wears BIPAP  . Ulcer of foot ([place])    right    Family History  Problem Relation Age of Onset  . Diabetes Other   . Heart disease Other   . Breast cancer Maternal Grandmother   . Breast cancer Paternal Grandmother   . Stroke Son   . Colon cancer Neg Hx   . Esophageal cancer Neg  Hx   . Rectal cancer Neg Hx   . Stomach cancer Neg Hx     SOCIAL HISTORY: Social History   Socioeconomic History  . Marital status: Single    Spouse name: Not on file  . Number of children: Not on file  . Years of education: Not on file  . Highest education level: Not on file  Occupational History  . Not on file  Tobacco Use  . Smoking status: Light Tobacco Smoker    Types: Cigarettes  . Smokeless tobacco: Never Used  . Tobacco comment: 3 cigarettes a day  Vaping Use  . Vaping Use: Never used  Substance and Sexual Activity  . Alcohol use: Not Currently    Comment: Beer - "on the weekends hanging out, maybe 2 or 3"   . Drug use: Not Currently    Types: Marijuana    Comment: last used 8 2020  . Sexual activity: Not on file  Other Topics Concern  . Not on file  Social History Narrative  . Not on file   Social Determinants of Health   Financial Resource Strain: Not on file  Food Insecurity: Not on file  Transportation Needs: Not on file  Physical Activity: Not on file  Stress: Not on file  Social Connections: Not on file  Intimate Partner Violence: Not on file    Allergies  Allergen Reactions  . Tramadol Swelling  . Nsaids Other (See Comments)    Pancreatitis  . Tolmetin Other (See Comments)    Pancreatitis  . Tylenol [Acetaminophen] Other (See Comments)    unknown  . Aspirin Other (See Comments)    "Makes my pancreas act up"     Current Facility-Administered Medications  Medication Dose Route Frequency Provider Last Rate Last Admin  . acetaminophen (TYLENOL) tablet 650 mg  650 mg Oral Q6H PRN Morgan Brod, MD       Or  . acetaminophen (TYLENOL) suppository 650 mg  650 mg Rectal Q6H PRN Morgan Brod, MD      . amitriptyline (ELAVIL) tablet 100 mg  100 mg Oral QHS Morgan Brod, MD   100 mg at 05/17/20 2229  . busPIRone (BUSPAR) tablet 10 mg  10 mg Oral TID Morgan Brod, MD   10 mg at 05/18/20 0841  . carbamazepine (TEGRETOL) tablet  400 mg  400 mg Oral BID Morgan Brod, MD   400 mg at 05/18/20 0841  . ceFEPIme (MAXIPIME) 2 g in sodium chloride 0.9 % 100 mL IVPB  2 g Intravenous Q12H Morgan Phi, DO      . enoxaparin (LOVENOX) injection 40 mg  40 mg Subcutaneous Q24H Morgan Brod, MD   40 mg at 05/17/20 1938  . furosemide (LASIX) injection 20 mg  20 mg Intravenous BID Morgan Brod, MD   20 mg at 05/18/20 0945  . gabapentin (NEURONTIN) capsule 800 mg  800 mg Oral TID Morgan Brod, MD   800 mg at 05/18/20 0841  . insulin aspart (novoLOG) injection 0-15 Units  0-15 Units Subcutaneous TID WC Morgan Brod, MD   5 Units at 05/18/20 0841  . insulin aspart (novoLOG) injection 0-5 Units  0-5 Units Subcutaneous QHS Morgan Brod, MD   2 Units at 05/17/20 2230  . insulin glargine (LANTUS) injection 5 Units  5 Units Subcutaneous Daily Morgan Brod, MD   5 Units at 05/18/20 [id number]  . mupirocin cream (BACTROBAN) 2 %   Topical Daily Morgan Phi, DO      . OLANZapine (ZYPREXA) tablet 7.5 mg  7.5 mg Oral QHS Morgan Brod, MD   7.5 mg at 05/17/20 2229  . ondansetron (ZOFRAN) tablet 4 mg  4 mg Oral Q6H PRN Morgan Brod, MD       Or  . ondansetron [facility]) injection 4 mg  4 mg Intravenous Q6H PRN Morgan Brod, MD      . polyethylene glycol (MIRALAX / GLYCOLAX) packet 17 g  17 g Oral Daily PRN Morgan Brod, MD      . sodium chloride flush (NS) 0.9 % injection 3 mL  3 mL Intravenous Q12H Morgan Brod, MD   3 mL at 05/17/20 1340  . [START ON 05/19/2020] vancomycin (VANCOREADY) IVPB 750 mg/150 mL  750 mg Intravenous Q24H Durward Fortes, Student-PharmD        REVIEW OF SYSTEMS: Unable to obtain from patient.  PHYSICAL EXAM:   Vitals:   05/17/20 1727 05/17/20 2009 05/18/20 0400 05/18/20 0600  BP: (!) 146/95 128/83 109/76   Pulse: 82 (!) 110 (!) 115   Resp: 12 19 20    Temp: 98.6 F (37 C) 97.9 F (36.6 C) 98.5 F (36.9 C)   TempSrc: Oral Oral Oral   SpO2: 98% 100%  99%   Weight:    89.4 kg  Height:        GENERAL: The patient is a well-nourished female, in no acute distress. The vital signs are documented above. CARDIAC: There is a regular rate and rhythm.  VASCULAR: I do not detect carotid bruits. She has palpable femoral pulses. I cannot palpate popliteal or pedal pulses. There was no Doppler available on the floor to evaluate her pulses. PULMONARY: There is good air exchange bilaterally without wheezing or rales. ABDOMEN: Soft and non-tender with normal pitched bowel sounds.  MUSCULOSKELETAL: There are no major deformities. NEUROLOGIC: No focal weakness or paresthesias are detected. SKIN: She has multiple wounds on both legs as documented in the photographs below.              PSYCHIATRIC: The patient has a normal affect.  DATA:    ABIs have been ordered but are pending.  I have also ordered a duplex of her left femoropopliteal bypass graft.  LABS: I have reviewed her labs from 05/18/2020.  GFR was 33.  Creatinine is 1.8.   White count is 11.3.  Platelets 479,000.  Hemoglobin 9.1.  INR 1.3.  CT abdomen pelvis: I reviewed the CT abdomen pelvis that was done on 04/19/2020.  This was done without IV contrast.  There was some calcific disease in her iliac arteries.  MRI RIGHT FOOT: There was no definitive evidence of osteomyelitis.  The exam was somewhat limited due to motion artifact.  X-ray left foot: I reviewed the x-ray of the left foot that was done on 05/17/2020.  This showed soft tissue swelling but no acute osseous abnormality or evidence of osteomyelitis.  X-ray right foot: I reviewed the x-ray of the right foot that was done on 05/17/2020.  This showed evidence of osteomyelitis of the medial head of the fifth metatarsal and possibly at the anterior inferior calcaneus.

## 2020-05-18 NOTE — Progress Notes (Addendum)
  PROGRESS NOTE  Notified by RN that patient's mentation continued to deteriorate this afternoon. More lethargic and not answering questions like she did yesterday or this morning. Patient seen and examined. She is easily arousable, alert, tracks but does not attempt to answer any questions. This is significant change from my exam this morning. Vitals stable. She does not appear to be in any distress.   Discontinued sedating medications ABG 7.51 / 29.1  CT head ordered    Dessa Phi, DO [facility] 05/18/2020, 5:02 PM  Available via Epic secure chat 7am-7pm After these hours, please refer to coverage provider listed on [email or web address]

## 2020-05-18 NOTE — Progress Notes (Signed)
Reviewed note about pt having CKD 3 and Dr. [place] Pink waiting 48 hrs ( advised by IV Team consult) for negative Blood cultures. Pt does have a # 20 Ultrasound placed IV in Lt FA. On 05/17/20

## 2020-05-18 NOTE — Progress Notes (Addendum)
PROGRESS NOTE    Morgan Beasley  GLO:[id number] DOB: 1960/05/24 DOA: 05/17/2020 PCP: Riki Sheer, NP     Brief Narrative:  Morgan Beasley is a 60 y.o. female with medical history significant for bipolar disorder, systolic heart failure, diabetes mellitus uncontrolled and COPD who was discharged from the hospital 2 weeks ago for agitation, heart failure and uncontrolled diabetes mellitus.  She was discharged to a skilled nursing facility.  The exact same presentation and discharge occurred 2 weeks before that.  She presented to the emergency room on the early morning of 1/17 with complaints of bilateral foot pain and high sugars.  Patient related that she had been out of her medications now for several days-weeks and she was found to have a CBG of 435, although not in DKA, white blood cell count of 13.6 with a normal lactic acid level.  X-rays noted cellulitis of the left toes, but right foot noted osteomyelitis of the fourth and fifth toes.  Patient started on broad-spectrum antibiotics and blood cultures were taken.  Lactic acid level normal.  Chest x-ray noted improved but not resolved interstitial edema when compared to x-rays from her previous hospitalization, BNP 2900.  Patient was admitted for sepsis secondary to right foot osteomyelitis.  New events last 24 hours / Subjective: Patient remains a poor historian, very difficult to understand.  She is alert, oriented to [facility] long hospital, trying to eat breakfast in bed.   Assessment & Plan:   Principal Problem:   Sepsis ([place]) Active Problems:   COPD (chronic obstructive pulmonary disease) (HCC)   PVD (peripheral vascular disease) (HCC)   Acute on chronic systolic CHF (congestive heart failure) (HCC)   Benign essential HTN   Obstructive sleep apnea (adult) (pediatric)   Uncontrolled type 2 diabetes mellitus with hyperglycemia, with long-term current use of insulin (HCC)   Tobacco dependence   Osteomyelitis of ankle or  foot, right, acute (HCC)   Non-compliance   Polysubstance abuse (HCC)   Sepsis secondary to right foot osteomyelitis, left foot cellulitis, bilateral lower extremity gangrene with peripheral vascular disease -Sepsis present on admission -Blood cultures pending -Vancomycin/cefepime -Appreciate vascular surgery, monitoring for improvement before proceeding with arteriography.  High risk for limb loss  Acute on chronic systolic heart failure -With markedly elevated BNP -Started on IV Lasix  AKI on CKD stage IIIa -Continue to monitor closely while on IV Lasix, vancomycin  Acute hypoxemic respiratory failure -SPO2 82% on room air and now requiring 2 L nasal cannula O2  Diabetes mellitus, uncontrolled with hyperglycemia and peripheral neuropathy -Hemoglobin A1c 9.3 -Continue Lantus, sliding scale insulin -Hold Neurontin  History of bipolar disorder, delirium and psychosis previous hospitalization -PTA Elavil, BuSpar, Tegretol, Zyprexa - hold sedating medications   History of CVA -Continue Lipitor.  Plavix on hold pending possible arteriography  Polysubstance abuse -UDS positive for opiates and cocaine     In agreement with assessment of the pressure ulcer as below:  Pressure Injury 05/18/20 Heel Right Unstageable - Full thickness tissue loss in which the base of the injury is covered by slough (yellow, tan, gray, green or brown) and/or eschar (tan, brown or black) in the wound bed. (Active)  05/18/20   Location: Heel  Location Orientation: Right  Staging: Unstageable - Full thickness tissue loss in which the base of the injury is covered by slough (yellow, tan, gray, green or brown) and/or eschar (tan, brown or black) in the wound bed.  Wound Description (Comments):   Present on Admission:  Yes     Pressure Injury 05/18/20 Ankle Right;Posterior Unstageable - Full thickness tissue loss in which the base of the injury is covered by slough (yellow, tan, gray, green or brown)  and/or eschar (tan, brown or black) in the wound bed. (Active)  05/18/20   Location: Ankle  Location Orientation: Right;Posterior  Staging: Unstageable - Full thickness tissue loss in which the base of the injury is covered by slough (yellow, tan, gray, green or brown) and/or eschar (tan, brown or black) in the wound bed.  Wound Description (Comments):   Present on Admission: Yes         DVT prophylaxis:  heparin injection 5,000 Units Start: 05/18/20 1430  Code Status: Full Family Communication: None at bedside  Disposition Plan:  Status is: Inpatient  Remains inpatient appropriate because:Ongoing diagnostic testing needed not appropriate for outpatient work up, IV treatments appropriate due to intensity of illness or inability to take PO and Inpatient level of care appropriate due to severity of illness   Dispo: The patient is from: Home              Anticipated d/c is to: SNF              Anticipated d/c date is: > 3 days              Patient currently is not medically stable to d/c.  Remains on IV antibiotics, IV Lasix, need to monitor infection as well as BMP closely.  Possible arteriography with vascular surgery pending improvement      Consultants:   Vascular surgery  Procedures:   None  Antimicrobials:  Anti-infectives (From admission, onward)   Start     Dose/Rate Route Frequency Ordered Stop   05/19/20 1000  vancomycin (VANCOREADY) IVPB 750 mg/150 mL        750 mg 150 mL/hr over 60 Minutes Intravenous Every 24 hours 05/18/20 1143     05/18/20 1215  ceFEPIme (MAXIPIME) 2 g in sodium chloride 0.9 % 100 mL IVPB        2 g 200 mL/hr over 30 Minutes Intravenous Every 12 hours 05/18/20 1124     05/17/20 1800  vancomycin (VANCOCIN) IVPB 1000 mg/200 mL premix  Status:  Discontinued        1,000 mg 200 mL/hr over 60 Minutes Intravenous Every 12 hours 05/17/20 1253 05/18/20 1143   05/17/20 1400  cefTRIAXone (ROCEPHIN) 2 g in sodium chloride 0.9 % 100 mL IVPB   Status:  Discontinued        2 g 200 mL/hr over 30 Minutes Intravenous Every 24 hours 05/17/20 1217 05/18/20 1123   05/17/20 1230  vancomycin (VANCOCIN) IVPB 1000 mg/200 mL premix  Status:  Discontinued        1,000 mg 200 mL/hr over 60 Minutes Intravenous  Once 05/17/20 1217 05/17/20 1221   05/17/20 0530  vancomycin (VANCOCIN) IVPB 1000 mg/200 mL premix        1,000 mg 200 mL/hr over 60 Minutes Intravenous  Once 05/17/20 0524 05/17/20 0903   05/17/20 0530  piperacillin-tazobactam (ZOSYN) IVPB 3.375 g        3.375 g 100 mL/hr over 30 Minutes Intravenous  Once 05/17/20 0524 05/17/20 0700        Objective: Vitals:   05/17/20 1727 05/17/20 2009 05/18/20 0400 05/18/20 0600  BP: (!) 146/95 128/83 109/76   Pulse: 82 (!) 110 (!) 115   Resp: 12 19 20    Temp: 98.6 F (37 C) 97.9 F (  36.6 C) 98.5 F (36.9 C)   TempSrc: Oral Oral Oral   SpO2: 98% 100% 99%   Weight:    89.4 kg  Height:        Intake/Output Summary (Last 24 hours) at 05/18/2020 1326 Last data filed at 05/18/2020 1000 Gross per 24 hour  Intake 220 ml  Output --  Net 220 ml   Filed Weights   05/17/20 0135 05/18/20 0600  Weight: 79.4 kg 89.4 kg    Examination:  General exam: Appears calm and comfortable  Respiratory system: Clear to auscultation. Respiratory effort normal. No respiratory distress. No conversational dyspnea.  Cardiovascular system: S1 & S2 heard, RRR. No murmurs. +1 bilateral pedal edema. Gastrointestinal system: Abdomen is nondistended, soft and nontender. Normal bowel sounds heard. Central nervous system: Alert and oriented to self and place Extremities& skin:          Data Reviewed: I have personally reviewed following labs and imaging studies  CBC: Recent Labs  Lab 05/17/20 0607 05/17/20 0645 05/18/20 0950  WBC 13.6*  --  11.3*  HGB 9.6* 12.6 9.1*  HCT 30.6* 37.0 29.9*  MCV 76.7*  --  78.1*  PLT 517*  --  478*   Basic Metabolic Panel: Recent Labs  Lab 05/17/20 0607  05/17/20 0645 05/18/20 0950  NA 132* 131* 130*  K 3.9 3.7 4.9  CL 95* 95* 97*  CO2 25  --  24  GLUCOSE 435* 426* 244*  BUN 14 12 18   CREATININE 1.09* 0.90 1.77*  CALCIUM 8.3*  --  8.1*   GFR: Estimated Creatinine Clearance: 39 mL/min (A) (by C-G formula based on SCr of 1.77 mg/dL (H)). Liver Function Tests: No results for input(s): AST, ALT, ALKPHOS, BILITOT, PROT, ALBUMIN in the last 168 hours. No results for input(s): LIPASE, AMYLASE in the last 168 hours. No results for input(s): AMMONIA in the last 168 hours. Coagulation Profile: Recent Labs  Lab 05/18/20 0950  INR 1.3*   Cardiac Enzymes: No results for input(s): CKTOTAL, CKMB, CKMBINDEX, TROPONINI in the last 168 hours. BNP (last 3 results) Recent Labs    12/19/19 1546 02/09/20 0825  PROBNP 1,148* 1,185*   HbA1C: Recent Labs    05/17/20 0801  HGBA1C 9.3*   CBG: Recent Labs  Lab 05/17/20 0758 05/17/20 1329 05/17/20 2006 05/18/20 0734 05/18/20 1135  GLUCAP 347* 362* 244* 243* 162*   Lipid Profile: No results for input(s): CHOL, HDL, LDLCALC, TRIG, CHOLHDL, LDLDIRECT in the last 72 hours. Thyroid Function Tests: No results for input(s): TSH, T4TOTAL, FREET4, T3FREE, THYROIDAB in the last 72 hours. Anemia Panel: No results for input(s): VITAMINB12, FOLATE, FERRITIN, TIBC, IRON, RETICCTPCT in the last 72 hours. Sepsis Labs: Recent Labs  Lab 05/17/20 2956 05/17/20 0639 05/17/20 0801 05/18/20 0950  PROCALCITON  --   --  0.16 0.24  LATICACIDVEN 1.6 1.9  --   --     Recent Results (from the past 240 hour(s))  Blood culture (routine x 2)     Status: None (Preliminary result)   Collection Time: 05/17/20  6:07 AM   Specimen: BLOOD  Result Value Ref Range Status   Specimen Description   Final    BLOOD LEFT ANTECUBITAL Performed at [facility], [place] [address], [place], [place] [address]    Special Requests   Final    BOTTLES DRAWN AEROBIC AND ANAEROBIC Blood Culture results may  not be optimal due to an excessive volume of blood received in culture bottles Performed at [facility]  [place] [address] [place], [place] [address]    Culture   Final    NO GROWTH < 24 HOURS Performed at [place] [address] [place], [place] [address]    Report Status PENDING  Incomplete  Resp Panel by RT-PCR (Flu A&B, Covid) Nasopharyngeal Swab     Status: None   Collection Time: 05/17/20  6:39 AM   Specimen: Nasopharyngeal Swab; Nasopharyngeal(NP) swabs in vial transport medium  Result Value Ref Range Status   SARS Coronavirus 2 by RT PCR NEGATIVE NEGATIVE Final    Comment: (NOTE) SARS-CoV-2 target nucleic acids are NOT DETECTED.  The SARS-CoV-2 RNA is generally detectable in upper respiratory specimens during the acute phase of infection. The lowest concentration of SARS-CoV-2 viral copies this assay can detect is 138 copies/mL. A negative result does not preclude SARS-Cov-2 infection and should not be used as the sole basis for treatment or other patient management decisions. A negative result may occur with  improper specimen collection/handling, submission of specimen other than nasopharyngeal swab, presence of viral mutation(s) within the areas targeted by this assay, and inadequate number of viral copies(<138 copies/mL). A negative result must be combined with clinical observations, patient history, and epidemiological information. The expected result is Negative.  Fact Sheet for Patients:  EntrepreneurPulse.com.au  Fact Sheet for Healthcare Providers:  IncredibleEmployment.be  This test is no t yet approved or cleared by the [place] FDA and  has been authorized for detection and/or diagnosis of SARS-CoV-2 by FDA under an Emergency Use Authorization (EUA). This EUA will remain  in effect (meaning this test can be used) for the duration of the COVID-19 declaration under Section 564(b)(1) of the  Act, 21 U.S.C.section 360bbb-3(b)(1), unless the authorization is terminated  or revoked sooner.       Influenza A by PCR NEGATIVE NEGATIVE Final   Influenza B by PCR NEGATIVE NEGATIVE Final    Comment: (NOTE) The Xpert Xpress SARS-CoV-2/FLU/RSV plus assay is intended as an aid in the diagnosis of influenza from Nasopharyngeal swab specimens and should not be used as a sole basis for treatment. Nasal washings and aspirates are unacceptable for Xpert Xpress SARS-CoV-2/FLU/RSV testing.  Fact Sheet for Patients: EntrepreneurPulse.com.au  Fact Sheet for Healthcare Providers: IncredibleEmployment.be  This test is not yet approved or cleared by the [place] FDA and has been authorized for detection and/or diagnosis of SARS-CoV-2 by FDA under an Emergency Use Authorization (EUA). This EUA will remain in effect (meaning this test can be used) for the duration of the COVID-19 declaration under Section 564(b)(1) of the Act, 21 U.S.C. section 360bbb-3(b)(1), unless the authorization is terminated or revoked.  Performed at [facility], [place] [address], [place], [place] [address]       Radiology Studies: DG Chest 2 View  Result Date: 05/17/2020 CLINICAL DATA:  61 year old female with lower extremity open wounds. Chest congestion. Preoperative study. EXAM: CHEST - 2 VIEW COMPARISON:  Portable chest 03/23/2020 and earlier. FINDINGS: Semi upright AP and lateral views of the chest. Stable cardiomegaly and mediastinal contours. Visualized tracheal air column is within normal limits. No pneumothorax. No pleural effusion. Regressed although not completely resolved bilateral pulmonary interstitial opacity compared to November. Chronic linear scarring at the left mid lung is stable. No consolidation. No acute osseous abnormality identified. Negative visible bowel gas pattern. IMPRESSION: Improved although not resolved bilateral pulmonary  interstitial opacity compared to November. Mild or developing pulmonary interstitial edema is possible. Viral/atypical respiratory infection felt  less likely. No pleural effusion or consolidation. Electronically Signed   By: Genevie Ann M.D.   On: 05/17/2020 07:20   MR FOOT RIGHT WO CONTRAST  Result Date: 05/17/2020 CLINICAL DATA:  Generalized body aches with reported two necrotic toes. Findings suspicious for osteomyelitis on radiographs. EXAM: MRI OF THE RIGHT FOREFOOT WITHOUT CONTRAST TECHNIQUE: Multiplanar, multisequence MR imaging of the right forefoot was performed without intravenous contrast. Patient was unable to complete the examination, and no post-contrast imaging was obtained. COMPARISON:  Radiographs same date. FINDINGS: Despite efforts by the technologist and patient, moderate to severe motion artifact is present on today's exam and could not be eliminated. This reduces exam sensitivity and specificity. At least 2 different coils were attempted. Image quality is significantly degraded. Bones/Joint/Cartilage The coronal T1 weighted images are of decent quality. The other sequences are limited, especially the T2 weighted sequences. No definite bone destruction or marrow replacement identified on the T1 weighted images. No large joint effusions. Ligaments The Lisfranc ligament and collateral ligaments of the metatarsophalangeal joints appear intact. Muscles and Tendons No identified abnormality. Soft tissues Generalized subcutaneous edema without focal fluid collection or foreign body. IMPRESSION: 1. Significantly limited examination due to motion artifact. The patient was unable to complete the examination. 2. No definite evidence of osteomyelitis. 3. Generalized subcutaneous edema without focal fluid collection or foreign body. Electronically Signed   By: Richardean Sale M.D.   On: 05/17/2020 13:36   DG Foot Complete Left  Result Date: 05/17/2020 CLINICAL DATA:  60 year old female with open wounds.   Gangrene. EXAM: LEFT FOOT - COMPLETE 3+ VIEW COMPARISON:  Left foot series 02/23/2020. FINDINGS: Increased soft tissue swelling since October in the distal foot. No soft tissue gas. Calcified peripheral vascular disease. Stable osteopenia in the 2nd through 5th metatarsal heads. No cortical osteolysis identified. No acute fracture or dislocation. Bulky calcaneus degenerative spurring is stable. IMPRESSION: 1. Increased soft tissue swelling since October but no acute osseous abnormality or plain radiographic evidence of osteomyelitis. 2. Calcified peripheral vascular disease. Electronically Signed   By: Genevie Ann M.D.   On: 05/17/2020 07:14   DG Foot Complete Right  Result Date: 05/17/2020 CLINICAL DATA:  60 year old female with open wounds. Gangrene. EXAM: RIGHT FOOT COMPLETE - 3+ VIEW COMPARISON:  Right tib fib series 04/10/2020. FINDINGS: Moderate soft tissue swelling in the distal foot. No soft tissue gas. There is cortical osteolysis at the medial head of the 5th metatarsal (arrow). Adjacent base of the 5th proximal phalanx appears to remain intact. Other metatarsal heads appear intact. No superimposed acute fracture or dislocation identified. Chronic bulky calcaneus degenerative spurring. Subtle loss of the cortex also at the anterior inferior calcaneus and adjacent tarsal bone, perhaps the lateral cuneiform. No additional cortical osteolysis identified. IMPRESSION: 1. Positive for evidence of Osteomyelitis at the medial head of the 5th metatarsal, and possibly also at the anterior inferior calcaneus and adjacent cuneiform (probably lateral cuneiform). 2. Moderate soft tissue swelling. No soft tissue gas. Electronically Signed   By: Genevie Ann M.D.   On: 05/17/2020 07:18   [place] EKG SITE RITE  Result Date: 05/17/2020 If Site Rite image not attached, placement could not be confirmed due to current cardiac rhythm.     Scheduled Meds: . amitriptyline  100 mg Oral QHS  . busPIRone  10 mg Oral TID  .  carbamazepine  400 mg Oral BID  . enoxaparin (LOVENOX) injection  40 mg Subcutaneous Q24H  . furosemide  20 mg Intravenous BID  .  gabapentin  800 mg Oral TID  . insulin aspart  0-15 Units Subcutaneous TID WC  . insulin aspart  0-5 Units Subcutaneous QHS  . insulin glargine  5 Units Subcutaneous Daily  . mupirocin cream   Topical Daily  . OLANZapine  7.5 mg Oral QHS  . sodium chloride flush  3 mL Intravenous Q12H   Continuous Infusions: . ceFEPime (MAXIPIME) IV    . [START ON 05/19/2020] vancomycin       LOS: 1 day      Time spent: 40 minutes   Dessa Phi, DO [facility] 05/18/2020, 1:26 PM   Available via Epic secure chat 7am-7pm After these hours, please refer to coverage provider listed on [email or web address]

## 2020-05-19 ENCOUNTER — Inpatient Hospital Stay (HOSPITAL_COMMUNITY): Payer: Medicare Other

## 2020-05-19 DIAGNOSIS — G9341 Metabolic encephalopathy: Secondary | ICD-10-CM

## 2020-05-19 DIAGNOSIS — N179 Acute kidney failure, unspecified: Secondary | ICD-10-CM

## 2020-05-19 DIAGNOSIS — E875 Hyperkalemia: Secondary | ICD-10-CM

## 2020-05-19 DIAGNOSIS — E872 Acidosis: Secondary | ICD-10-CM

## 2020-05-19 DIAGNOSIS — I10 Essential (primary) hypertension: Secondary | ICD-10-CM

## 2020-05-19 DIAGNOSIS — E871 Hypo-osmolality and hyponatremia: Secondary | ICD-10-CM

## 2020-05-19 LAB — RENAL FUNCTION PANEL
Albumin: 1.8 g/dL — ABNORMAL LOW (ref 3.5–5.0)
Anion gap: 15 (ref 5–15)
BUN: 27 mg/dL — ABNORMAL HIGH (ref 6–20)
CO2: 18 mmol/L — ABNORMAL LOW (ref 22–32)
Calcium: 8.2 mg/dL — ABNORMAL LOW (ref 8.9–10.3)
Chloride: 98 mmol/L (ref 98–111)
Creatinine, Ser: 3.01 mg/dL — ABNORMAL HIGH (ref 0.44–1.00)
GFR, Estimated: 17 mL/min — ABNORMAL LOW (ref >60)
Glucose, Bld: 113 mg/dL — ABNORMAL HIGH (ref 70–99)
Phosphorus: 5.8 mg/dL — ABNORMAL HIGH (ref 2.5–4.6)
Potassium: 5.6 mmol/L — ABNORMAL HIGH (ref 3.5–5.1)
Sodium: 131 mmol/L — ABNORMAL LOW (ref 135–145)

## 2020-05-19 LAB — GLUCOSE, CAPILLARY
Glucose-Capillary: 59 mg/dL — ABNORMAL LOW (ref 70–99)
Glucose-Capillary: 99 mg/dL (ref 70–99)
Glucose-Capillary: 91 mg/dL (ref 70–99)
Glucose-Capillary: 91 mg/dL (ref 70–99)
Glucose-Capillary: 94 mg/dL (ref 70–99)

## 2020-05-19 LAB — CBC
HCT: 28.9 % — ABNORMAL LOW (ref 36.0–46.0)
Hemoglobin: 9 g/dL — ABNORMAL LOW (ref 12.0–15.0)
MCH: 23.9 pg — ABNORMAL LOW (ref 26.0–34.0)
MCHC: 31.1 g/dL (ref 30.0–36.0)
MCV: 76.9 fL — ABNORMAL LOW (ref 80.0–100.0)
Platelets: 510 10*3/uL — ABNORMAL HIGH (ref 150–400)
RBC: 3.76 MIL/uL — ABNORMAL LOW (ref 3.87–5.11)
RDW: 25 % — ABNORMAL HIGH (ref 11.5–15.5)
WBC: 15.4 10*3/uL — ABNORMAL HIGH (ref 4.0–10.5)
nRBC: 0.9 % — ABNORMAL HIGH (ref 0.0–0.2)

## 2020-05-19 LAB — BASIC METABOLIC PANEL
Anion gap: 14 (ref 5–15)
BUN: 27 mg/dL — ABNORMAL HIGH (ref 6–20)
CO2: 21 mmol/L — ABNORMAL LOW (ref 22–32)
Calcium: 8.4 mg/dL — ABNORMAL LOW (ref 8.9–10.3)
Chloride: 98 mmol/L (ref 98–111)
Creatinine, Ser: 2.69 mg/dL — ABNORMAL HIGH (ref 0.44–1.00)
GFR, Estimated: 20 mL/min — ABNORMAL LOW (ref >60)
Glucose, Bld: 83 mg/dL (ref 70–99)
Potassium: 5.2 mmol/L — ABNORMAL HIGH (ref 3.5–5.1)
Sodium: 133 mmol/L — ABNORMAL LOW (ref 135–145)

## 2020-05-19 LAB — VANCOMYCIN, RANDOM: Vancomycin Rm: 24

## 2020-05-19 LAB — CK: Total CK: 130 U/L (ref 38–234)

## 2020-05-19 MED ORDER — SODIUM CHLORIDE 0.9 % IV SOLN
500.0000 mg | INTRAVENOUS | Status: DC
  Administered 2020-05-20: 500 mg via INTRAVENOUS
  Filled 2020-05-19: qty 10

## 2020-05-19 MED ORDER — SODIUM ZIRCONIUM CYCLOSILICATE 10 G PO PACK
10.0000 g | PACK | Freq: Once | ORAL | Status: AC
  Administered 2020-05-19: 10 g via ORAL
  Filled 2020-05-19: qty 1

## 2020-05-19 MED ORDER — INSULIN ASPART 100 UNIT/ML ~~LOC~~ SOLN: 0.0000 [IU] | Freq: Every day | SUBCUTANEOUS | Status: DC

## 2020-05-19 MED ORDER — DEXTROSE 50 % IV SOLN
INTRAVENOUS | Status: AC
  Filled 2020-05-19: qty 50

## 2020-05-19 MED ORDER — VANCOMYCIN VARIABLE DOSE PER UNSTABLE RENAL FUNCTION (PHARMACIST DOSING): Status: DC

## 2020-05-19 MED ORDER — METOPROLOL TARTRATE 25 MG PO TABS
12.5000 mg | ORAL_TABLET | Freq: Two times a day (BID) | ORAL | Status: DC
  Administered 2020-05-19: 12.5 mg via ORAL
  Filled 2020-05-19 (×3): qty 1

## 2020-05-19 MED ORDER — SODIUM CHLORIDE 0.9 % IV SOLN: INTRAVENOUS | Status: DC

## 2020-05-19 MED ORDER — INSULIN ASPART 100 UNIT/ML ~~LOC~~ SOLN
0.0000 [IU] | Freq: Three times a day (TID) | SUBCUTANEOUS | Status: DC
  Administered 2020-05-21: 1 [IU] via SUBCUTANEOUS

## 2020-05-19 MED ORDER — DEXTROSE 50 % IV SOLN
12.5000 g | INTRAVENOUS | Status: AC
  Administered 2020-05-19: 12.5 g via INTRAVENOUS

## 2020-05-19 MED ORDER — SODIUM CHLORIDE 0.9 % IV SOLN
2.0000 g | INTRAVENOUS | Status: DC
  Administered 2020-05-19 – 2020-05-20 (×2): 2 g via INTRAVENOUS
  Filled 2020-05-19 (×2): qty 2

## 2020-05-19 NOTE — Progress Notes (Signed)
PT Cancellation Note  Patient Details Name: Morgan Beasley MRN: [id number] DOB: [birth year]   Cancelled Treatment:    Reason Eval/Treat Not Completed: Fatigue/lethargy limiting ability to participate (Per RN, pt is lethargic today and not able to participate in PT. Will follow.)   Philomena Doheny PT 05/19/2020  Acute Rehabilitation Services Pager [phone] Office [phone]

## 2020-05-19 NOTE — Progress Notes (Signed)
Inpatient Diabetes Program Recommendations  AACE/ADA: New Consensus Statement on Inpatient Glycemic Control (2015)  Target Ranges:  Prepandial:   less than 140 mg/dL      Peak postprandial:   less than 180 mg/dL (1-2 hours)      Critically ill patients:  140 - 180 mg/dL   Lab Results  Component Value Date   GLUCAP 91 05/19/2020   HGBA1C 9.3 (H) 05/17/2020    Review of Glycemic Control  Diabetes history: DM2 Outpatient Diabetes medications: Lantus 8 QHS, Novolog 0-6 TID with meals (Not taking either insulin) Current orders for Inpatient glycemic control: Lantus 5 units QD, Novolog 0-9 units TID with meals and 0-5 HS  HgbA1C - 9.3%  Inpatient Diabetes Program Recommendations:     Decrease Novolog to 0-6 units TID with meals + 0-5 HS.  Continue to watch glucose trends.  Thank you. Lorenda Peck, RD, LDN, CDE Inpatient Diabetes Coordinator [phone]

## 2020-05-19 NOTE — Plan of Care (Signed)

## 2020-05-19 NOTE — Progress Notes (Signed)
PROGRESS NOTE  Morgan Beasley RDE:[id number] DOB: May 12, 1960   PCP: Riki Sheer, NP  Patient is from: Home  DOA: 05/17/2020 LOS: 2  Chief complaints: Bilateral feet pain  Brief Narrative / Interim history: 60 year old female with PMH of bipolar disorder, systolic CHF, DM-2, COPD not on oxygen presented to ED on 1/17 with complaints of bilateral foot pain and elevated blood glucose and admitted for hyperglycemia, sepsis due to left foot cellulitis and right foot osteomyelitis.  Started on broad-spectrum antibiotics.   Vascular surgery consulted and recommended transfer to [facility] for further evaluation and treatment. Hospital course complicated by acute metabolic encephalopathy and AKI.  Subjective: Seen and examined earlier this morning.  No major events overnight of this morning. Reports right knee pain but not able to describe further. She is awake but only oriented to self.  She had an episode of hypoglycemia to 56 that has improved after IV dextrose.  Also mild hyponatremia, mild hyperkalemia and AKI.   Objective: Vitals:   05/19/20 0548 05/19/20 0744 05/19/20 1136 05/19/20 1408  BP: 95/60 105/83 (!) 118/54 (!) 96/58  Pulse: 90 93  84  Resp: 20 13  16   Temp: 98.1 F (36.7 C) 98.1 F (36.7 C)  97.8 F (36.6 C)  TempSrc:  Oral  Oral  SpO2: 100% 100%  100%  Weight:      Height:        Intake/Output Summary (Last 24 hours) at 05/19/2020 1433 Last data filed at 05/19/2020 0900 Gross per 24 hour  Intake 400 ml  Output -  Net 400 ml   Filed Weights   05/17/20 0135 05/18/20 0600 05/19/20 0500  Weight: 79.4 kg 89.4 kg 88.9 kg    Examination:  GENERAL: No apparent distress.  Nontoxic. HEENT: MMM.  Vision and hearing grossly intact.  NECK: Supple.  No apparent JVD.  RESP: On 1 L by [place].  No IWOB.  Fair aeration bilaterally. CVS:  RRR. Heart sounds normal.  ABD/GI/GU: BS+. Abd soft, NTND.  MSK/EXT:  Moves extremities. No apparent deformity.  Trace edema  bilaterally. SKIN: no apparent skin lesion or wound NEURO: Awake.  Oriented only to self.  Follows commands. PSYCH: Calm. Normal affect.   Procedures:  None  Microbiology summarized: Influenza and COVID-19 PCR nonreactive. Blood cultures NGTD.  Assessment & Plan: Sepsis secondary to right foot osteomyelitis, left foot cellulitis, bilateral lower extremity gangrene with peripheral vascular disease: Sepsis POA.  Blood cultures NGTD. -Changed vancomycin to daptomycin in the setting of AKI. -Continue cefepime. -Vancomycin/cefepime -VVS-recommended transfer to [facility] for possible LE arteriography and amputation.  Acute on chronic systolic heart failure: TEE on 03/04/2020 with LVEF of 40%, moderately enlarged RV and RA.  Heart elevated BNP to 2900.  CXR concerning for pulmonary interstitial edema.  Started on IV Lasix but developed AKI. -Hold Lasix today.  Gentle IV fluid over the next 24 hours. -Strict intake and output -Monitor renal function.  AKI on CKD stage IIIa: Baseline Cr 0.9-1.0 >> 3.0 (Peak)>>>.  Could be due to IV Lasix and vancomycin in the setting of poor p.o. intake. BUN only 27 suggesting ATN. -Hold Lasix -Changed vancomycin to daptomycin -Gentle IV fluid as above -Renal ultrasound -Involve nephrology if no improvement.  Acute hypoxemic respiratory failure: Desaturated to 82% on RA and required 2 L.  Likely due to CHF -Wean oxygen as able  Uncontrolled DM-2 with hyperglycemia, hypoglycemia and peripheral neuropathy: A1c 9.3%. Recent Labs  Lab 05/18/20 1638 05/18/20 2040 05/19/20 0748 05/19/20 6314  05/19/20 1139  GLUCAP 141* 90 59* 99 91  -Discontinue Lantus -Decrease SSI to-obtain -Hold Neurontin in the setting of encephalopathy.  Acute toxic metabolic encephalopathy: History of bipolar disorder, delirium and psychosis previous hospitalization.  CT head without acute finding but known infarct. -PTA Elavil, BuSpar, Tegretol, Zyprexa - hold sedating medications    Hypertension: Soft blood pressure this morning. -Reduce metoprolol -Discontinue Lasix -Gentle IV fluid as above.  Hyperkalemia: K 5.6.  Likely due to renal failure. -Lokelma x1 -Recheck in the morning  Hyponatremia: Likely from renal failure.  History of CVA -Lipitor on hold while on daptomycin -Plavix on hold for possible angiography  Polysubstance abuse: UDS positive for opiates and cocaine -We will counsel when able to comprehend.  Body mass index is 31.63 kg/m.        Unstageable right heel and ankle pressure injury: POA Pressure Injury 05/18/20 Heel Right Unstageable - Full thickness tissue loss in which the base of the injury is covered by slough (yellow, tan, gray, green or brown) and/or eschar (tan, brown or black) in the wound bed. (Active)  05/18/20   Location: Heel  Location Orientation: Right  Staging: Unstageable - Full thickness tissue loss in which the base of the injury is covered by slough (yellow, tan, gray, green or brown) and/or eschar (tan, brown or black) in the wound bed.  Wound Description (Comments):   Present on Admission: Yes     Pressure Injury 05/18/20 Ankle Right;Posterior Unstageable - Full thickness tissue loss in which the base of the injury is covered by slough (yellow, tan, gray, green or brown) and/or eschar (tan, brown or black) in the wound bed. (Active)  05/18/20   Location: Ankle  Location Orientation: Right;Posterior  Staging: Unstageable - Full thickness tissue loss in which the base of the injury is covered by slough (yellow, tan, gray, green or brown) and/or eschar (tan, brown or black) in the wound bed.  Wound Description (Comments):   Present on Admission: Yes   DVT prophylaxis:  heparin injection 5,000 Units Start: 05/18/20 1400  Code Status: Full code Family Communication: Updated her daughters over the phone Status is: Inpatient.  Transfer to [facility] for [facility] care.  Remains inpatient appropriate  because:Persistent severe electrolyte disturbances, Altered mental status, Unsafe d/c plan, IV treatments appropriate due to intensity of illness or inability to take PO and Inpatient level of care appropriate due to severity of illness   Dispo: The patient is from: Home              Anticipated d/c is to: SNF              Anticipated d/c date is: > 3 days              Patient currently is not medically stable to d/c.       Consultants:  Vascular surgery   Sch Meds:  Scheduled Meds: . carbamazepine  400 mg Oral BID  . Chlorhexidine Gluconate Cloth  6 each Topical Daily  . heparin injection (subcutaneous)  5,000 Units Subcutaneous Q8H  . insulin aspart  0-5 Units Subcutaneous QHS  . insulin aspart  0-9 Units Subcutaneous TID WC  . insulin glargine  5 Units Subcutaneous Daily  . metoprolol tartrate  12.5 mg Oral BID  . mupirocin cream   Topical Daily  . sodium chloride flush  10-40 mL Intracatheter Q12H  . sodium chloride flush  3 mL Intravenous Q12H   Continuous Infusions: . sodium chloride 75  mL/hr at 05/19/20 1134  . ceFEPime (MAXIPIME) IV    . DAPTOmycin (CUBICIN)  IV     PRN Meds:.acetaminophen **OR** acetaminophen, ondansetron **OR** ondansetron (ZOFRAN) IV, polyethylene glycol, sodium chloride flush  Antimicrobials: Anti-infectives (From admission, onward)   Start     Dose/Rate Route Frequency Ordered Stop   05/19/20 2219  ceFEPIme (MAXIPIME) 2 g in sodium chloride 0.9 % 100 mL IVPB        2 g 200 mL/hr over 30 Minutes Intravenous Every 24 hours 05/19/20 0737     05/19/20 2200  DAPTOmycin (CUBICIN) 500 mg in sodium chloride 0.9 % IVPB        500 mg 220 mL/hr over 30 Minutes Intravenous Every 48 hours 05/19/20 1251     05/19/20 1000  vancomycin (VANCOREADY) IVPB 750 mg/150 mL  Status:  Discontinued        750 mg 150 mL/hr over 60 Minutes Intravenous Every 24 hours 05/18/20 1143 05/19/20 0736   05/19/20 0735  vancomycin variable dose per unstable renal function  (pharmacist dosing)  Status:  Discontinued         Does not apply See admin instructions 05/19/20 0736 05/19/20 1243   05/18/20 1215  ceFEPIme (MAXIPIME) 2 g in sodium chloride 0.9 % 100 mL IVPB  Status:  Discontinued        2 g 200 mL/hr over 30 Minutes Intravenous Every 12 hours 05/18/20 1124 05/19/20 0737   05/17/20 1800  vancomycin (VANCOCIN) IVPB 1000 mg/200 mL premix  Status:  Discontinued        1,000 mg 200 mL/hr over 60 Minutes Intravenous Every 12 hours 05/17/20 1253 05/18/20 1143   05/17/20 1400  cefTRIAXone (ROCEPHIN) 2 g in sodium chloride 0.9 % 100 mL IVPB  Status:  Discontinued        2 g 200 mL/hr over 30 Minutes Intravenous Every 24 hours 05/17/20 1217 05/18/20 1123   05/17/20 1230  vancomycin (VANCOCIN) IVPB 1000 mg/200 mL premix  Status:  Discontinued        1,000 mg 200 mL/hr over 60 Minutes Intravenous  Once 05/17/20 1217 05/17/20 1221   05/17/20 0530  vancomycin (VANCOCIN) IVPB 1000 mg/200 mL premix        1,000 mg 200 mL/hr over 60 Minutes Intravenous  Once 05/17/20 0524 05/17/20 0903   05/17/20 0530  piperacillin-tazobactam (ZOSYN) IVPB 3.375 g        3.375 g 100 mL/hr over 30 Minutes Intravenous  Once 05/17/20 0524 05/17/20 0700       I have personally reviewed the following labs and images: CBC: Recent Labs  Lab 05/17/20 0607 05/17/20 0645 05/18/20 0950 05/19/20 0420  WBC 13.6*  --  11.3* 15.4*  HGB 9.6* 12.6 9.1* 9.0*  HCT 30.6* 37.0 29.9* 28.9*  MCV 76.7*  --  78.1* 76.9*  PLT 517*  --  479* 510*   BMP &GFR Recent Labs  Lab 05/17/20 0607 05/17/20 0645 05/18/20 0950 05/19/20 0420 05/19/20 1130  NA 132* 131* 130* 133* 131*  K 3.9 3.7 4.9 5.2* 5.6*  CL 95* 95* 97* 98 98  CO2 25  --  24 21* 18*  GLUCOSE 435* 426* 244* 83 113*  BUN 14 12 18  27* 27*  CREATININE 1.09* 0.90 1.77* 2.69* 3.01*  CALCIUM 8.3*  --  8.1* 8.4* 8.2*  PHOS  --   --   --   --  5.8*   Estimated Creatinine Clearance: 22.9 mL/min (A) (by C-G formula based on SCr of  3.01  mg/dL (H)). Liver & Pancreas: Recent Labs  Lab 05/19/20 1130  ALBUMIN 1.8*   No results for input(s): LIPASE, AMYLASE in the last 168 hours. No results for input(s): AMMONIA in the last 168 hours. Diabetic: Recent Labs    05/17/20 0801  HGBA1C 9.3*   Recent Labs  Lab 05/18/20 1638 05/18/20 2040 05/19/20 0748 05/19/20 0835 05/19/20 1139  GLUCAP 141* 90 59* 99 91   Cardiac Enzymes: Recent Labs  Lab 05/19/20 1130  CKTOTAL 130   Recent Labs    12/19/19 1546 02/09/20 0825  PROBNP 1,148* 1,185*   Coagulation Profile: Recent Labs  Lab 05/18/20 0950  INR 1.3*   Thyroid Function Tests: No results for input(s): TSH, T4TOTAL, FREET4, T3FREE, THYROIDAB in the last 72 hours. Lipid Profile: No results for input(s): CHOL, HDL, LDLCALC, TRIG, CHOLHDL, LDLDIRECT in the last 72 hours. Anemia Panel: No results for input(s): VITAMINB12, FOLATE, FERRITIN, TIBC, IRON, RETICCTPCT in the last 72 hours. Urine analysis:    Component Value Date/Time   COLORURINE AMBER (A) 05/17/2020 1242   APPEARANCEUR TURBID (A) 05/17/2020 1242   LABSPEC 1.023 05/17/2020 1242   PHURINE 5.0 05/17/2020 1242   GLUCOSEU >=500 (A) 05/17/2020 1242   HGBUR SMALL (A) 05/17/2020 1242   BILIRUBINUR NEGATIVE 05/17/2020 1242   [place] 05/17/2020 1242   PROTEINUR >=300 (A) 05/17/2020 1242   NITRITE NEGATIVE 05/17/2020 1242   LEUKOCYTESUR NEGATIVE 05/17/2020 1242   Sepsis Labs: Invalid input(s): PROCALCITONIN, [place]  Microbiology: Recent Results (from the past 240 hour(s))  Blood culture (routine x 2)     Status: None (Preliminary result)   Collection Time: 05/17/20  6:07 AM   Specimen: BLOOD  Result Value Ref Range Status   Specimen Description   Final    BLOOD LEFT ANTECUBITAL Performed at [facility] [address] [place], [place] [address]    Special Requests   Final    BOTTLES DRAWN AEROBIC AND ANAEROBIC Blood Culture results may not be optimal due to  an excessive volume of blood received in culture bottles Performed at [facility] [address] [place], [place] [address]    Culture   Final    NO GROWTH 2 DAYS Performed at [facility] [address], [place], [place] [address]    Report Status PENDING  Incomplete  Resp Panel by RT-PCR (Flu A&B, Covid) Nasopharyngeal Swab     Status: None   Collection Time: 05/17/20  6:39 AM   Specimen: Nasopharyngeal Swab; Nasopharyngeal(NP) swabs in vial transport medium  Result Value Ref Range Status   SARS Coronavirus 2 by RT PCR NEGATIVE NEGATIVE Final    Comment: (NOTE) SARS-CoV-2 target nucleic acids are NOT DETECTED.  The SARS-CoV-2 RNA is generally detectable in upper respiratory specimens during the acute phase of infection. The lowest concentration of SARS-CoV-2 viral copies this assay can detect is 138 copies/mL. A negative result does not preclude SARS-Cov-2 infection and should not be used as the sole basis for treatment or other patient management decisions. A negative result may occur with  improper specimen collection/handling, submission of specimen other than nasopharyngeal swab, presence of viral mutation(s) within the areas targeted by this assay, and inadequate number of viral copies(<138 copies/mL). A negative result must be combined with clinical observations, patient history, and epidemiological information. The expected result is Negative.  Fact Sheet for Patients:  EntrepreneurPulse.com.au  Fact Sheet for Healthcare Providers:  IncredibleEmployment.be  This test is no t yet approved  or cleared by the [place] and  has been authorized for detection and/or diagnosis of SARS-CoV-2 by FDA under an Emergency Use Authorization (EUA). This EUA will remain  in effect (meaning this test can be used) for the duration of the COVID-19 declaration under Section 564(b)(1) of the Act, 21 U.S.C.section  360bbb-3(b)(1), unless the authorization is terminated  or revoked sooner.       Influenza A by PCR NEGATIVE NEGATIVE Final   Influenza B by PCR NEGATIVE NEGATIVE Final    Comment: (NOTE) The Xpert Xpress SARS-CoV-2/FLU/RSV plus assay is intended as an aid in the diagnosis of influenza from Nasopharyngeal swab specimens and should not be used as a sole basis for treatment. Nasal washings and aspirates are unacceptable for Xpert Xpress SARS-CoV-2/FLU/RSV testing.  Fact Sheet for Patients: EntrepreneurPulse.com.au  Fact Sheet for Healthcare Providers: IncredibleEmployment.be  This test is not yet approved or cleared by the [place] FDA and has been authorized for detection and/or diagnosis of SARS-CoV-2 by FDA under an Emergency Use Authorization (EUA). This EUA will remain in effect (meaning this test can be used) for the duration of the COVID-19 declaration under Section 564(b)(1) of the Act, 21 U.S.C. section 360bbb-3(b)(1), unless the authorization is terminated or revoked.  Performed at [facility], [place] [address] [place], [place] [address]     Radiology Studies: CT HEAD WO CONTRAST  Result Date: 05/18/2020 CLINICAL DATA:  Altered mental status and lethargy EXAM: CT HEAD WITHOUT CONTRAST TECHNIQUE: Contiguous axial images were obtained from the base of the skull through the vertex without intravenous contrast. COMPARISON:  03/21/2020 FINDINGS: Brain: Mild atrophic changes and chronic white matter ischemic changes are seen. Previously noted areas of ischemia in the right parietooccipital lobe and left occipital lobe are stable but better delineated on the current exam. No new focal infarct is seen. No hemorrhage is noted. Vascular: No hyperdense vessel or unexpected calcification. Skull: Normal. Negative for fracture or focal lesion. Sinuses/Orbits: No acute finding. Other: None. IMPRESSION: More well delineated  infarcts in the right parietooccipital and left occipital lobes when compared with the prior exam. No new focal infarct is seen. Chronic atrophic and ischemic changes without acute abnormality. Electronically Signed   By: Inez Catalina M.D.   On: 05/18/2020 20:36   [place] EKG SITE RITE  Result Date: 05/18/2020 If Site Rite image not attached, placement could not be confirmed due to current cardiac rhythm.     Taye T. [place]  If 7PM-7AM, please contact night-coverage [email or web address] 05/19/2020, 2:33 PM

## 2020-05-19 NOTE — Progress Notes (Addendum)
Pharmacy Antibiotic Note  Morgan Beasley is a 60 y.o. female admitted on 05/17/2020 with sepsis due to right foot osteomyelitis and left foot cellulitis.  Pharmacy has been consulted for daptomycin dosing. Patient is currently on cefepime and vancomycin with worsening AKI on CKD. Carbamazepine precludes use of transition to linezolid due to drug-drug interaction. Plan is to hold vancomycin and transition to daptomycin.   Plan: Will dose daptomycin with ABW d/t BMI > 30.  Due to renal insufficiency and product availability, will  Dose between 6-8 mg/kg (500 mg) q 48 hours Will f/u renal function for dose adjustment  Baseline and weekly CK Will f/u culture results and clinical course  Height: 5\' 6"  (167.6 cm) Weight: 88.9 kg (195 lb 15.8 oz) IBW/kg (Calculated) : 59.3  Temp (24hrs), Avg:98.1 F (36.7 C), Min:97.8 F (36.6 C), Max:98.5 F (36.9 C)  Recent Labs  Lab 05/17/20 0607 05/17/20 0608 05/17/20 0639 05/17/20 0645 05/18/20 0950 05/19/20 0420 05/19/20 0759 05/19/20 1130  WBC 13.6*  --   --   --  11.3* 15.4*  --   --   CREATININE 1.09*  --   --  0.90 1.77* 2.69*  --  3.01*  LATICACIDVEN  --  1.6 1.9  --   --   --   --   --   VANCORANDOM  --   --   --   --   --   --  24  --     Estimated Creatinine Clearance: 22.9 mL/min (A) (by C-G formula based on SCr of 3.01 mg/dL (H)).    Allergies  Allergen Reactions  . Tramadol Swelling  . Nsaids Other (See Comments)    Pancreatitis  . Tolmetin Other (See Comments)    Pancreatitis  . Tylenol [Acetaminophen] Other (See Comments)    unknown  . Aspirin Other (See Comments)    "Makes my pancreas act up"    Thank you for allowing pharmacy to be a part of this patient's care.  Ulice Dash D 05/19/2020 12:52 PM

## 2020-05-19 NOTE — Progress Notes (Signed)
VASCULAR SURGERY:   I saw this patient in consult yesterday with gangrene of both lower extremities.  On the right side she has osteomyelitis of the calcaneus with 2 gangrenous toes and also a wound on her Achilles.  I think that she will likely require amputation on the right given the osteomyelitis.  Even with successful revascularization, given the extent of the wounds I think there is very little chance of limb salvage.   On the left side she has a patent femoropopliteal bypass graft and has had previous tibial angioplasty.  Despite all of this the wound on the dorsum of the left foot has not shown a lot of improvement and she is at high risk for limb loss.  When I saw her yesterday she was quite lethargic and I really could not obtain any meaningful history.  If she shows significant improvement clinically I think we could consider an arteriogram on Friday with CO2 and limited contrast.  She has CKD 3/4.   At this point, I think it probably would be best to have the patient transferred to [facility] by [facility]. We can try to arrange for an arteriogram on Friday if she improves.  Deitra Mayo, MD Office: [phone]

## 2020-05-19 NOTE — Progress Notes (Signed)
Hypoglycemic Event  CBG: 59  Treatment: 12.5g dextrose   Symptoms: none   Follow-up CBG: [id number] CBG Result:99  Possible Reasons for Event: pt not consuming many POs at this time   Comments/MD notified:[place], Awaiting orders     [place]

## 2020-05-20 DIAGNOSIS — T7611XA Adult physical abuse, suspected, initial encounter: Secondary | ICD-10-CM

## 2020-05-20 LAB — CBC WITH DIFFERENTIAL/PLATELET
Abs Immature Granulocytes: 0.16 10*3/uL — ABNORMAL HIGH (ref 0.00–0.07)
Basophils Absolute: 0 10*3/uL (ref 0.0–0.1)
Basophils Relative: 0 %
Eosinophils Absolute: 0.1 10*3/uL (ref 0.0–0.5)
Eosinophils Relative: 1 %
HCT: 29.3 % — ABNORMAL LOW (ref 36.0–46.0)
Hemoglobin: 9.2 g/dL — ABNORMAL LOW (ref 12.0–15.0)
Immature Granulocytes: 1 %
Lymphocytes Relative: 11 %
Lymphs Abs: 1.5 10*3/uL (ref 0.7–4.0)
MCH: 24.1 pg — ABNORMAL LOW (ref 26.0–34.0)
MCHC: 31.4 g/dL (ref 30.0–36.0)
MCV: 76.7 fL — ABNORMAL LOW (ref 80.0–100.0)
Monocytes Absolute: 1.1 10*3/uL — ABNORMAL HIGH (ref 0.1–1.0)
Monocytes Relative: 8 %
Neutro Abs: 11.3 10*3/uL — ABNORMAL HIGH (ref 1.7–7.7)
Neutrophils Relative %: 79 %
Platelets: 478 10*3/uL — ABNORMAL HIGH (ref 150–400)
RBC: 3.82 MIL/uL — ABNORMAL LOW (ref 3.87–5.11)
RDW: 24.9 % — ABNORMAL HIGH (ref 11.5–15.5)
WBC: 14.2 10*3/uL — ABNORMAL HIGH (ref 4.0–10.5)
nRBC: 0.7 % — ABNORMAL HIGH (ref 0.0–0.2)

## 2020-05-20 LAB — RENAL FUNCTION PANEL
Albumin: 1.6 g/dL — ABNORMAL LOW (ref 3.5–5.0)
Anion gap: 13 (ref 5–15)
BUN: 34 mg/dL — ABNORMAL HIGH (ref 6–20)
CO2: 21 mmol/L — ABNORMAL LOW (ref 22–32)
Calcium: 7.9 mg/dL — ABNORMAL LOW (ref 8.9–10.3)
Chloride: 98 mmol/L (ref 98–111)
Creatinine, Ser: 3.44 mg/dL — ABNORMAL HIGH (ref 0.44–1.00)
GFR, Estimated: 15 mL/min — ABNORMAL LOW (ref >60)
Glucose, Bld: 94 mg/dL (ref 70–99)
Phosphorus: 6.5 mg/dL — ABNORMAL HIGH (ref 2.5–4.6)
Potassium: 5.1 mmol/L (ref 3.5–5.1)
Sodium: 132 mmol/L — ABNORMAL LOW (ref 135–145)

## 2020-05-20 LAB — GLUCOSE, CAPILLARY
Glucose-Capillary: 99 mg/dL (ref 70–99)
Glucose-Capillary: 82 mg/dL (ref 70–99)
Glucose-Capillary: 87 mg/dL (ref 70–99)
Glucose-Capillary: 77 mg/dL (ref 70–99)
Glucose-Capillary: 92 mg/dL (ref 70–99)

## 2020-05-20 LAB — SODIUM, URINE, RANDOM: Sodium, Ur: 41 mmol/L

## 2020-05-20 LAB — PROTEIN / CREATININE RATIO, URINE
Creatinine, Urine: 34.93 mg/dL
Protein Creatinine Ratio: 2.69 mg/mg{Cre} — ABNORMAL HIGH (ref 0.00–0.15)
Total Protein, Urine: 94 mg/dL

## 2020-05-20 LAB — MAGNESIUM: Magnesium: 1.5 mg/dL — ABNORMAL LOW (ref 1.7–2.4)

## 2020-05-20 LAB — CREATININE, URINE, RANDOM: Creatinine, Urine: 33.54 mg/dL

## 2020-05-20 MED ORDER — SODIUM CHLORIDE 0.9 % IV SOLN: INTRAVENOUS | Status: DC

## 2020-05-20 MED ORDER — CHLORHEXIDINE GLUCONATE 0.12 % MT SOLN
15.0000 mL | Freq: Two times a day (BID) | OROMUCOSAL | Status: DC
  Administered 2020-05-20 – 2020-05-27 (×9): 15 mL via OROMUCOSAL
  Filled 2020-05-20 (×6): qty 15

## 2020-05-20 MED ORDER — SODIUM CHLORIDE 0.9 % IV BOLUS
500.0000 mL | Freq: Once | INTRAVENOUS | Status: AC
  Administered 2020-05-20: 500 mL via INTRAVENOUS

## 2020-05-20 MED ORDER — ORAL CARE MOUTH RINSE
15.0000 mL | Freq: Two times a day (BID) | OROMUCOSAL | Status: DC
  Administered 2020-05-20 – 2020-05-27 (×11): 15 mL via OROMUCOSAL

## 2020-05-20 MED ORDER — FUROSEMIDE 10 MG/ML IJ SOLN
80.0000 mg | Freq: Once | INTRAMUSCULAR | Status: AC
  Administered 2020-05-20: 80 mg via INTRAVENOUS
  Filled 2020-05-20: qty 8

## 2020-05-20 MED ORDER — MAGNESIUM SULFATE 2 GM/50ML IV SOLN
2.0000 g | Freq: Once | INTRAVENOUS | Status: AC
  Administered 2020-05-20: 2 g via INTRAVENOUS
  Filled 2020-05-20: qty 50

## 2020-05-20 NOTE — Consult Note (Signed)
Renal Service Consult Note [facility]  Morgan Beasley 05/20/2020 Sol Blazing, MD Requesting Physician: Dr Cyndia Skeeters  Reason for Consult: Renal failure HPI: The patient is a 60 y.o. year-old w/ hx of bipolar d/o, syst CHF, DM2, COPD admitted on 1/17 for L foot cellulitis and R foot osteomyelitis. Started on IV abx. In hospital complications have occurred w/ AKI and AMS. IV Vanc has been changed to daptomycin 1/19, and cefepime continued. Pt has LVEF 40% by echo. Was getting IV lasix, now on hold w/ rising creat. CXR showed IS edema, on nasal O2 2.L.  Asked to see for renal failure. Hx prior CVA. Poor prognosis, pall care consulted.   Creat 0.9 on admit, got IV vanc and IVF"s . Then creat went up to 2.6 yest and 3.4 today. Lasix given yest but stopped today.  Pt is very confused, picking at her bandages, perseverating. No hx obtained.    ROS - n/a   Past Medical History  Past Medical History:  Diagnosis Date  . Alcohol dependence ([place])   . Anemia   . Anxiety   . Breast cancer ([place])   . Chronic combined systolic and diastolic CHF (congestive heart failure) ([place])   . Cigarette nicotine dependence   . CKD (chronic kidney disease), stage III ([place])   . Colon polyps   . COPD (chronic obstructive pulmonary disease) ([place])   . Diabetes mellitus without complication ([place])   . Diabetic neuropathy ([place])   . Gout   . Hyperlipemia   . Hypertension   . Insomnia   . Lymphedema   . Marijuana use   . Mild CAD 2016   a. NSTEMI 2016 in context of cocaine abuse, 50% RCA% at that time.  . OSA treated with BiPAP   . PAD (peripheral artery disease) ([place])    a. s/p L SFA stenting 08/2019. b. left fem-to-below-knee-popliteal bypass 09/2019.  Marland Kitchen Pancreatitis    acute on chronic due to ETOH initially.    . Sleep apnea    wears BIPAP  . Ulcer of foot ([place])    right   Past Surgical History  Past Surgical History:  Procedure Laterality Date  . ABDOMINAL AORTOGRAM W/LOWER  EXTREMITY N/A 09/26/2019   Procedure: ABDOMINAL AORTOGRAM W/LOWER EXTREMITY;  Surgeon: Angelia Mould, MD;  Location: [place] CV LAB;  Service: Cardiovascular;  Laterality: N/A;  . ABDOMINAL AORTOGRAM W/LOWER EXTREMITY Bilateral 12/08/2019   Procedure: ABDOMINAL AORTOGRAM W/LOWER EXTREMITY;  Surgeon: Waynetta Sandy, MD;  Location: [place] CV LAB;  Service: Cardiovascular;  Laterality: Bilateral;  . ABDOMINAL AORTOGRAM W/LOWER EXTREMITY Left 01/26/2020   Procedure: ABDOMINAL AORTOGRAM W/LOWER EXTREMITY;  Surgeon: Waynetta Sandy, MD;  Location: [place] CV LAB;  Service: Cardiovascular;  Laterality: Left;  . ABDOMINAL HYSTERECTOMY    . BUBBLE STUDY  03/04/2020   Procedure: BUBBLE STUDY;  Surgeon: Elouise Munroe, MD;  Location: [facility] ENDOSCOPY;  Service: Cardiology;;  . [place] [facility]  . FEMORAL-POPLITEAL BYPASS GRAFT Left 10/14/2019   Procedure: BYPASS GRAFT LEFT FEMORAL-POPLITEAL ARTERY USING NONREVERSED SAPHENOUS VEIN;  Surgeon: Waynetta Sandy, MD;  Location: [place];  Service: Vascular;  Laterality: Left;  Marland Kitchen MASTECTOMY Right April 2016  . PERIPHERAL VASCULAR ATHERECTOMY Left 01/26/2020   Procedure: PERIPHERAL VASCULAR ATHERECTOMY;  Surgeon: Waynetta Sandy, MD;  Location: [place] CV LAB;  Service: Cardiovascular;  Laterality: Left;  PT and AT - Laser  . PERIPHERAL VASCULAR BALLOON ANGIOPLASTY Left 01/26/2020  Procedure: PERIPHERAL VASCULAR BALLOON ANGIOPLASTY;  Surgeon: Waynetta Sandy, MD;  Location: [place] CV LAB;  Service: Cardiovascular;  Laterality: Left;  TP Trunk   . PERIPHERAL VASCULAR INTERVENTION Left 09/26/2019   Procedure: PERIPHERAL VASCULAR INTERVENTION;  Surgeon: Angelia Mould, MD;  Location: [place] CV LAB;  Service: Cardiovascular;  Laterality: Left;  superficial femoral  . TEE WITHOUT CARDIOVERSION N/A 03/04/2020   Procedure: TRANSESOPHAGEAL ECHOCARDIOGRAM (TEE);  Surgeon:  Elouise Munroe, MD;  Location: [place];  Service: Cardiology;  Laterality: N/A;   Family History  Family History  Problem Relation Age of Onset  . Diabetes Other   . Heart disease Other   . Breast cancer Maternal Grandmother   . Breast cancer Paternal Grandmother   . Stroke Son   . Colon cancer Neg Hx   . Esophageal cancer Neg Hx   . Rectal cancer Neg Hx   . Stomach cancer Neg Hx    Social History  reports that she has been smoking cigarettes. She has never used smokeless tobacco. She reports previous alcohol use. She reports previous drug use. Drug: Marijuana. Allergies  Allergies  Allergen Reactions  . Tramadol Swelling  . Nsaids Other (See Comments)    Pancreatitis  . Tolmetin Other (See Comments)    Pancreatitis  . Tylenol [Acetaminophen] Other (See Comments)    unknown  . Aspirin Other (See Comments)    "Makes my pancreas act up"    Home medications Prior to Admission medications   Medication Sig Start Date End Date Taking? Authorizing Provider  acetaminophen (TYLENOL) 325 MG tablet Take 2 tablets (650 mg total) by mouth every 6 (six) hours. Patient not taking: No sig reported 04/15/20   Pokhrel, Corrie Mckusick, MD  albuterol (VENTOLIN HFA) 108 (90 Base) MCG/ACT inhaler Inhale 2 puffs into the lungs every 6 (six) hours as needed for wheezing or shortness of breath.  Patient not taking: Reported on 05/17/2020 05/19/14   [provider]  amitriptyline (ELAVIL) 100 MG tablet Take 100 mg by mouth at bedtime.  Patient not taking: Reported on 05/17/2020    [provider]  atorvastatin (LIPITOR) 20 MG tablet Take 1 tablet (20 mg total) by mouth daily. Patient not taking: Reported on 05/17/2020 03/19/20 04/18/20  Arrien, Jimmy Picket, MD  budesonide-formoterol [facility]) 160-4.5 MCG/ACT inhaler Inhale 2 puffs into the lungs 2 (two) times daily. Patient not taking: Reported on 05/17/2020    [provider]  busPIRone (BUSPAR) 10 MG tablet Take 1  tablet (10 mg total) by mouth 3 (three) times daily. Patient not taking: No sig reported 04/15/20   Pokhrel, Corrie Mckusick, MD  carbamazepine (TEGRETOL) 200 MG tablet Take 2 tablets (400 mg total) by mouth 2 (two) times daily. Patient not taking: No sig reported 04/15/20   Pokhrel, Corrie Mckusick, MD  clopidogrel (PLAVIX) 75 MG tablet Take 1 tablet (75 mg total) by mouth daily. Patient not taking: Reported on 05/17/2020 09/27/19 09/26/20  Lyndee Hensen, DO  dicyclomine (BENTYL) 10 MG capsule Take 1 capsule (10 mg total) by mouth 4 (four) times daily -  before meals and at bedtime. Patient not taking: No sig reported 04/28/20   Pokhrel, Corrie Mckusick, MD  docusate sodium (COLACE) 100 MG capsule Take 1 capsule (100 mg total) by mouth 2 (two) times daily as needed for mild constipation or moderate constipation. Patient not taking: No sig reported 04/15/20   Pokhrel, Corrie Mckusick, MD  feeding supplement, GLUCERNA SHAKE, (GLUCERNA SHAKE) LIQD Take 237 mLs by mouth 2 (two) times daily  between meals. Patient not taking: No sig reported 04/28/20   Pokhrel, Corrie Mckusick, MD  furosemide (LASIX) 40 MG tablet Take 1 tablet (40 mg total) by mouth daily. Patient not taking: Reported on 05/17/2020 03/19/20 04/18/20  Arrien, Jimmy Picket, MD  hydrocortisone cream 1 % Apply 1 application topically 3 (three) times daily as needed for itching (minor skin irritation). Patient not taking: No sig reported 04/15/20   Pokhrel, Corrie Mckusick, MD  insulin aspart (NOVOLOG) 100 UNIT/ML injection Inject 0-6 Units into the skin 3 (three) times daily with meals. Patient not taking: No sig reported 04/15/20   Pokhrel, Corrie Mckusick, MD  insulin glargine (LANTUS) 100 UNIT/ML injection Inject 0.08 mLs (8 Units total) into the skin at bedtime. Patient not taking: No sig reported 04/28/20   Pokhrel, Corrie Mckusick, MD  lipase/protease/amylase (CREON) 36000 UNITS CPEP capsule Take 2 capsule by mouth with each meal and 1 capsule by mouth with each snack Patient not taking: Reported on  05/17/2020 09/17/19   Armbruster, Carlota Raspberry, MD  metoprolol succinate (TOPROL-XL) 50 MG 24 hr tablet Take 1 tablet (50 mg total) by mouth daily. Take with or immediately following a meal. Patient not taking: Reported on 05/17/2020 12/22/19   Fay Records, MD  Multiple Vitamin (MULTIVITAMIN WITH MINERALS) TABS tablet Take 1 tablet by mouth daily. Patient not taking: No sig reported 04/28/20   Pokhrel, Corrie Mckusick, MD  Multiple Vitamins-Minerals (ADULT ONE DAILY GUMMIES) CHEW Chew 1 capsule by mouth daily. Patient not taking: Reported on 05/17/2020    [provider]  NARCAN 4 MG/0.1ML LIQD nasal spray kit Place 1 spray into the nose as needed (opioid overdose).  Patient not taking: Reported on 05/17/2020 11/25/19   [provider]  nicotine (NICODERM CQ - DOSED IN MG/24 HOURS) 14 mg/24hr patch Place 1 patch (14 mg total) onto the skin daily. Patient not taking: No sig reported 04/16/20   Pokhrel, Laxman, MD  OLANZapine (ZYPREXA) 7.5 MG tablet Take 1 tablet (7.5 mg total) by mouth at bedtime. Patient not taking: No sig reported 04/28/20   Pokhrel, Corrie Mckusick, MD  ondansetron (ZOFRAN-ODT) 4 MG disintegrating tablet Take 1 tablet (4 mg total) by mouth every 8 (eight) hours as needed for nausea or vomiting (if no IV access). Patient not taking: No sig reported 04/15/20   Pokhrel, Corrie Mckusick, MD  polyethylene glycol (MIRALAX / GLYCOLAX) 17 g packet Take 17 g by mouth daily as needed for moderate constipation. Patient not taking: No sig reported 04/15/20   Pokhrel, Corrie Mckusick, MD  simethicone (MYLICON) 80 MG chewable tablet Chew 1 tablet (80 mg total) by mouth 4 (four) times daily as needed (gas pain). Patient not taking: No sig reported 04/15/20   Flora Lipps, MD     Vitals:   05/20/20 1025 05/20/20 1031 05/20/20 1123 05/20/20 1416  BP: (!) 64/50  (!) 144/109 (!) 141/89  Pulse: 100  98 98  Resp: (!) 22  18 (!) 24  Temp: 98.2 F (36.8 C)  (!) 97.5 F (36.4 C) 97.6 F (36.4 C)  TempSrc: Axillary   Oral Axillary  SpO2: 91% 97% 100% 98%  Weight:      Height:       Exam Gen confused, babbling, does not follow commands, awake No rash, cyanosis or gangrene Sclera anicteric, throat clear  No jvd or bruits Chest clear bilat  RRR no MRG  Abd soft ntnd no mass or ascites +bs GU defer MS no joint effusions or deformity Ext 2+ LUE edema and 1-2+ LE edema, bilat  gangrenous changes to the feet Neuro is as above    Home meds:  - lipitor/ plavix / lasix 40 qd/ metoprolol xl 50  - zyprexa / narcan prn/ buspirone / elavil 100 hs  - insulin lantus/ novolog tid  - symbicort bid  - tegretol bid  - prn's/ vitamins/ supplements    UA 1/17 - turbid, >300 prot, 11-20 rbc, 6-10 wbc , rare bact    Renal US 1/19 - 12 cm kidneys , no hydro or ^'d echo    CXR 1/17 - IMPRESSION: Improved although not resolved bilateral pulmonary interstitial opacity compared to November. Mild or developing pulmonary interstitial edema is possible. Viral/atypical respiratory infection felt less likely. No pleural effusion or consolidation.     Assessment/ Plan: 1. AKI - creat 0.90 on admission. Got IVF"s and decomp CHF developed, so given IV lasix. Got IV vanc as well. Suspect AKI due to decomp CHF and vanc toxicity.  Already changed to daptomycin, vanc dc'd. UA w/ rbc's, few bact. Renal US no obstruction. Place foley (if not yet done), cont supportive care, will give one dose 80 mg lasix IV tonight, f/u labs in am.  2. Prognosis - very poor MS. Don't think she is uremic.  Poor outlook, would be a poor HD candidate I believe. Recommend conservative care.  3. L foot cellulitis/ R foot osteo - per pmd 4. IDDM 5. Seizure d/o 6.  Bipolar d/o  7. AMS - multifactorial 8. COPD 9. HTN - BP's good      Kelly Splinter  MD 05/20/2020, 5:32 PM  Recent Labs  Lab 05/19/20 0420 05/20/20 0305  WBC 15.4* 14.2*  HGB 9.0* 9.2*   Recent Labs  Lab 05/19/20 1130 05/20/20 0305  K 5.6* 5.1  BUN 27* 34*  CREATININE 3.01*  3.44*  CALCIUM 8.2* 7.9*  PHOS 5.8* 6.5*

## 2020-05-20 NOTE — Progress Notes (Addendum)
VASCULAR SURGERY:  In reviewing the notes it sounds like she will not be clinically stable for arteriography tomorrow.  We can consider arteriography next week if she shows clinical improvement.  In the meantime we are waiting for her transferred to [facility] by [facility].  Deitra Mayo, MD Office: [phone]

## 2020-05-20 NOTE — Care Management Important Message (Signed)
Important Message  Patient Details IM Letter given to the Patient. Name: Morgan Beasley MRN: [id number] Date of Birth: 07-15-1960   Medicare Important Message Given:  Yes     Kerin Salen 05/20/2020, 9:56 AM
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
## 2020-05-20 NOTE — TOC Progression Note (Signed)
Transition of Care [facility]) - Progression Note    Patient Details  Name: Jala Dundon MRN: [id number] Date of Birth: 12/24/1960  Transition of Care [facility]) CM/SW Contact  Gresham Caetano, [name], RN Phone Number: 05/20/2020, 1:03 PM  Clinical Narrative:   PT recc SNF. Will await vascular services for transfer to [facility]    Expected Discharge Plan: [place] Barriers to Discharge: Continued Medical Work up  Expected Discharge Plan and Services Expected Discharge Plan: [place]   Discharge Planning Services: CM Consult Post Acute Care Choice: [place] Living arrangements for the past 2 months: Single Family Home Expected Discharge Date: 05/20/20                                     Social Determinants of Health (SDOH) Interventions    Readmission Risk Interventions Readmission Risk Prevention Plan 05/20/2020 03/18/2020 10/21/2019  Transportation Screening Complete Complete Complete  Medication Review (RN Care Manager) Complete - Complete  PCP or Specialist appointment within 3-5 days of discharge Complete Not Complete Complete  PCP/Specialist Appt Not Complete comments - First available appt 12/1 -  [place] or Home Care Consult Complete Complete Complete  SW Recovery Care/Counseling Consult Complete Complete Complete  Palliative Care Screening Not Applicable Not Applicable Not Applicable  Skilled Nursing Facility Complete Not Applicable Not Applicable  Some recent data might be hidden

## 2020-05-20 NOTE — TOC Initial Note (Signed)
Transition of Care [facility]) - Initial/Assessment Note    Patient Details  Name: Morgan Beasley MRN: [id number] Date of Birth: 1961-01-14  Transition of Care [facility]) CM/SW Contact:    Morgan Phi, RN Phone Number: 05/20/2020, 12:52 PM  Clinical Narrative: Patient only Alert to self,only says yes when asked any question. Spoke to dtr Morgan about d/c plans, & family environment-states patient was @ [facility] d/c on 1/12,stated patient fell while there(dtr didn't witness, she said patient's roommate told her)-asked if she reported it she said no-informed her to contact [email or web address] & also inform the facility-she voiced understanding. She states she is unable to care for patient when she is ready to leave the hospital, she wants patient to go to a SNF. PT is ordered-await recc.Noted patient for transfer to [facility].                 Expected Discharge Plan: Home/Self Care Barriers to Discharge: Continued Medical Work up   Patient Goals and CMS Choice Patient states their goals for this hospitalization and ongoing recovery are:: per dtr Morgan need rehab CMS Medicare.gov Compare Post Acute Care list provided to:: Patient Represenative (must comment) (Morgan dtr [phone]) Choice offered to / list presented to : Adult Children  Expected Discharge Plan and Services Expected Discharge Plan: Home/Self Care   Discharge Planning Services: CM Consult Post Acute Care Choice: [place] Living arrangements for the past 2 months: Single Family Home Expected Discharge Date: 05/20/20                                    Prior Living Arrangements/Services Living arrangements for the past 2 months: Single Family Home Lives with:: Adult Children Patient language and need for interpreter reviewed:: Yes Do you feel safe going back to the place where you live?: Yes      Need for Family Participation in Patient Care: Yes (Comment) Care giver support system in place?: Yes  (comment) Current home services: DME (rw, w/c) Criminal Activity/Legal Involvement Pertinent to Current Situation/Hospitalization: No - Comment as needed  Activities of Daily Living Home Assistive Devices/Equipment: None ADL Screening (condition at time of admission) Patient's cognitive ability adequate to safely complete daily activities?: No Is the patient deaf or have difficulty hearing?: No Does the patient have difficulty seeing, even when wearing glasses/contacts?: No Does the patient have difficulty concentrating, remembering, or making decisions?: Yes Patient able to express need for assistance with ADLs?: Yes Does the patient have difficulty dressing or bathing?: Yes Independently performs ADLs?: No Communication: Independent Dressing (OT): Needs assistance Is this a change from baseline?: Pre-admission baseline Grooming: Needs assistance Is this a change from baseline?: Pre-admission baseline Feeding: Independent Is this a change from baseline?: Pre-admission baseline Bathing: Needs assistance Is this a change from baseline?: Change from baseline, expected to last >3 days Toileting: Needs assistance Is this a change from baseline?: Change from baseline, expected to last >3days In/Out Bed: Needs assistance Is this a change from baseline?: Change from baseline, expected to last >3 days Walks in Home: Needs assistance Is this a change from baseline?: Change from baseline, expected to last >3 days Does the patient have difficulty walking or climbing stairs?: Yes Weakness of Legs: Both Weakness of Arms/Hands: None  Permission Sought/Granted Permission sought to share information with : Case Manager Permission granted to share information with : Yes, Verbal Permission Granted  Share Information with  NAME: Case Manager     Permission granted to share info w Relationship: Morgan Beasley (dtr) [phone]     Emotional Assessment Appearance:: Appears stated  age Attitude/Demeanor/Rapport: Gracious Affect (typically observed): Accepting Orientation: : Oriented to Self,Oriented to  Time Alcohol / Substance Use: Alcohol Use,Tobacco Use,Illicit Drugs Psych Involvement: Yes (comment)  Admission diagnosis:  Gangrene ([place]) [I96] Sepsis ([place]) [A41.9] Uncontrolled type 2 diabetes mellitus with hyperglycemia ([place]) [E11.65] Patient Active Problem List   Diagnosis Date Noted  . Osteomyelitis of ankle or foot, right, acute ([place]) 05/17/2020  . Sepsis ([place]) 05/17/2020  . Non-compliance 05/17/2020  . Polysubstance abuse ([place]) 05/17/2020  . Acute metabolic encephalopathy 97/67/3419  . Anasarca 03/21/2020  . Cerebral embolism with cerebral infarction 03/01/2020  . Septic shock ([place]) 02/23/2020  . Acute on chronic systolic CHF (congestive heart failure) ([place]) 02/23/2020  . Obesity (BMI 30-39.9) 02/23/2020  . Ischemia of left lower extremity 10/09/2019  . PVD (peripheral vascular disease) ([place])   . Diabetic peripheral neuropathy associated with type 2 diabetes mellitus ([place])   . Acute on chronic diastolic congestive heart failure ([place])   . Lower extremity edema   . Dyspnea 09/21/2019  . Bronchitis 02/25/2019  . Uncontrolled diabetes mellitus with circulatory complication, with long-term current use of insulin ([place]) 02/25/2019  . Peripheral neuropathy 02/25/2019  . COPD (chronic obstructive pulmonary disease) ([place]) 02/25/2019  . Malignant neoplasm of upper-outer quadrant of right breast in female, estrogen receptor positive ([place]) 01/08/2019  . DKA (diabetic ketoacidosis) ([place]) 02/20/2018  . Hx-TIA (transient ischemic attack) 02/20/2018  . Mixed hyperlipidemia 02/20/2018  . OAB (overactive bladder) 10/24/2017  . Uncontrolled type 2 diabetes mellitus with hyperglycemia, with long-term current use of insulin ([place]) 10/24/2017  . Recurrent UTI 10/24/2017  . Gastritis, Helicobacter pylori 37/90/2409  . Microscopic hematuria 09/26/2016  . Intractable  vomiting 09/08/2016  . Chronic bilateral upper abdominal pain 09/08/2016  . Sleep disturbance 06/22/2016  . Tobacco dependence 01/16/2016  . Cocaine abuse ([place]) 04/24/2015  . Vasospasm ([place]) 04/24/2015  . Lymphedema of arm 02/06/2015  . Anxiety and depression 06/05/2014  . 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 Gastro-esophageal reflux disease without esophagitis 04/06/2014  . Other fatigue 04/06/2014  . Snoring 04/06/2014  . Other abnormal and inconclusive findings on diagnostic imaging of breast 04/03/2014  . Obstructive sleep apnea (adult) (pediatric) 03/06/2014  . Benign essential HTN 02/18/2014   PCP:  Riki Sheer, NP Pharmacy:   [facility], [place] [address] [place] [address] Phone: [phone] Fax: [phone]     Social Determinants of Health (SDOH) Interventions    Readmission Risk Interventions Readmission Risk Prevention Plan 03/18/2020 10/21/2019  Transportation Screening Complete Complete  Medication Review (RN Care Manager) - Complete  PCP or Specialist appointment within 3-5 days of discharge Not Complete Complete  PCP/Specialist Appt Not Complete comments First available appt 12/1 -  [place] or [place] Complete Complete  SW Recovery Care/Counseling Consult Complete Complete  Palliative Care Screening Not Applicable Not [place] Not Applicable Not Applicable  Some recent data might be hidden
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
## 2020-05-20 NOTE — Evaluation (Signed)
Physical Therapy Evaluation Patient Details Name: Morgan Beasley MRN: [id number] DOB: 12-22-1960 Today's Date: 05/20/2020   History of Present Illness  60 year old female with PMH of bipolar disorder, systolic CHF, DM-2, COPD not on oxygen, PAD, lymphedema, diabetic neuropathy, fem-pop bypass graft and presented to ED on 1/17 with complaints of bilateral foot pain and elevated blood glucose and admitted for hyperglycemia, sepsis due to left foot cellulitis and right foot osteomyelitis.  Started on broad-spectrum antibiotics. Vascular surgery consulted and recommended transfer to [facility] for further evaluation and treatment.  Hospital course complicated by acute metabolic encephalopathy and AKI.  Pt recently admitted 03/21/20 and and discharged 04/28/20 with dx with acute respiratory failure 2* pleural effusions and pulmonary edema as well as acute metabolic encephalopathy and cerebral embolism with cerebral infarction.  Clinical Impression  Pt admitted with above diagnosis.  Pt currently with functional limitations due to the deficits listed below (see PT Problem List). Pt will benefit from skilled PT to increase their independence and safety with mobility to allow discharge to the venue listed below.  Pt with decreased cognition and difficulty following commands.  Pt did sit EOB briefly today however would require +2 and significant assist for OOB.  Pt awaiting possible transfer to [facility] and pending plans for procedures and treatment of LE arterial issues.  Pt will likely require SNF upon d/c.     Follow Up Recommendations SNF    Equipment Recommendations  None recommended by PT    Recommendations for Other Services       Precautions / Restrictions Precautions Precautions: Fall      Mobility  Bed Mobility Overal bed mobility: Needs Assistance Bed Mobility: Supine to Sit;Sit to Supine     Supine to sit: Mod assist Sit to supine: Max assist   General bed  mobility comments: multimodal cues for technique, assist for trunk upright, assist for upper and lower body upon return to bed    Transfers                 General transfer comment: deferred due to cognition and increased assist  Ambulation/Gait                Stairs            Wheelchair Mobility    Modified Rankin (Stroke Patients Only)       Balance Overall balance assessment: Needs assistance Sitting-balance support: Feet supported;No upper extremity supported Sitting balance-Leahy Scale: Good                                       Pertinent Vitals/Pain Pain Assessment: Faces Faces Pain Scale: Hurts even more Pain Location: Bil feet R>L Pain Descriptors / Indicators: Tender;Grimacing Pain Intervention(s): Repositioned;Monitored during session    Home Living Family/patient expects to be discharged to:: Private residence Living Arrangements: Alone Available Help at Discharge: Family Type of Home: Apartment Home Access: Stairs to enter Entrance Stairs-Rails: Can reach both Entrance Stairs-Number of Steps: 1 Home Layout: One level Home Equipment: Walker - 4 wheels;Bedside commode Additional Comments: Pt poor historian - info taken from chart review from PT Eval 08/2019    Prior Function Level of Independence: Needs assistance   Gait / Transfers Assistance Needed: uses rollator for mobility (rollator used during previous admission)  ADL's / Homemaking Assistance Needed: Likely requires assist from daughters for ADLs/IADLs (per note 08/2019, required assist from  daughter)  Comments: Pt poor historian - info taken from chart review from PT Eval 08/2019     Hand Dominance   Dominant Hand: Right    Extremity/Trunk Assessment   Upper Extremity Assessment Upper Extremity Assessment: Difficult to assess due to impaired cognition    Lower Extremity Assessment Lower Extremity Assessment: Difficult to assess due to impaired  cognition;Generalized weakness    Cervical / Trunk Assessment Cervical / Trunk Assessment: Normal  Communication   Communication: Expressive difficulties  Cognition Arousal/Alertness: Lethargic Behavior During Therapy: Flat affect Overall Cognitive Status: No family/caregiver present to determine baseline cognitive functioning Area of Impairment: Following commands;Safety/judgement;Problem solving;Orientation                 Orientation Level: Disoriented to;Situation;Place;Time;Person   Memory: Decreased recall of precautions;Decreased short-term memory Following Commands: Follows one step commands inconsistently Safety/Judgement: Decreased awareness of safety;Decreased awareness of deficits   Problem Solving: Decreased initiation;Slow processing;Requires verbal cues        General Comments      Exercises     Assessment/Plan    PT Assessment Patient needs continued PT services  PT Problem List Decreased strength;Decreased activity tolerance;Decreased balance;Decreased mobility;Decreased coordination;Decreased cognition;Decreased knowledge of use of DME;Obesity;Decreased safety awareness       PT Treatment Interventions DME instruction;Gait training;Functional mobility training;Therapeutic activities;Balance training;Therapeutic exercise;Patient/family education;Wheelchair mobility training    PT Goals (Current goals can be found in the Care Plan section)  Acute Rehab PT Goals PT Goal Formulation: Patient unable to participate in goal setting Time For Goal Achievement: 06/03/20 Potential to Achieve Goals: Fair    Frequency Min 2X/week   Barriers to discharge        Co-evaluation               AM-PAC PT "6 Clicks" Mobility  Outcome Measure Help needed turning from your back to your side while in a flat bed without using bedrails?: A Lot Help needed moving from lying on your back to sitting on the side of a flat bed without using bedrails?: A Lot Help  needed moving to and from a bed to a chair (including a wheelchair)?: A Lot Help needed standing up from a chair using your arms (e.g., wheelchair or bedside chair)?: A Lot Help needed to walk in hospital room?: Total Help needed climbing 3-5 steps with a railing? : Total 6 Click Score: 10    End of Session Equipment Utilized During Treatment: Gait belt Activity Tolerance: Other (comment) (limited by cognition) Patient left: in bed;with call bell/phone within reach;with bed alarm set Nurse Communication: Mobility status PT Visit Diagnosis: Muscle weakness (generalized) (M62.81);Difficulty in walking, not elsewhere classified (R26.2)    Time: 6389-3734 PT Time Calculation (min) (ACUTE ONLY): 17 min   Charges:   PT Evaluation $PT Eval Low Complexity: 1 Low        Kati PT, DPT Acute Rehabilitation Services Pager: [phone] Office: [phone]  Trena Platt 05/20/2020, 12:54 PM

## 2020-05-21 DIAGNOSIS — Z7189 Other specified counseling: Secondary | ICD-10-CM

## 2020-05-21 DIAGNOSIS — Z66 Do not resuscitate: Secondary | ICD-10-CM

## 2020-05-21 DIAGNOSIS — R410 Disorientation, unspecified: Secondary | ICD-10-CM

## 2020-05-21 LAB — RENAL FUNCTION PANEL
Albumin: 1.6 g/dL — ABNORMAL LOW (ref 3.5–5.0)
Anion gap: 15 (ref 5–15)
BUN: 41 mg/dL — ABNORMAL HIGH (ref 6–20)
CO2: 19 mmol/L — ABNORMAL LOW (ref 22–32)
Calcium: 8.2 mg/dL — ABNORMAL LOW (ref 8.9–10.3)
Chloride: 99 mmol/L (ref 98–111)
Creatinine, Ser: 3.72 mg/dL — ABNORMAL HIGH (ref 0.44–1.00)
GFR, Estimated: 13 mL/min — ABNORMAL LOW (ref >60)
Glucose, Bld: 92 mg/dL (ref 70–99)
Phosphorus: 6 mg/dL — ABNORMAL HIGH (ref 2.5–4.6)
Potassium: 4.7 mmol/L (ref 3.5–5.1)
Sodium: 133 mmol/L — ABNORMAL LOW (ref 135–145)

## 2020-05-21 LAB — BLOOD GAS, ARTERIAL
Acid-base deficit: 4 mmol/L — ABNORMAL HIGH (ref 0.0–2.0)
Bicarbonate: 20.6 mmol/L (ref 20.0–28.0)
O2 Saturation: 87.2 %
Patient temperature: 98.6
pCO2 arterial: 38.1 mmHg (ref 32.0–48.0)
pH, Arterial: 7.352 (ref 7.350–7.450)
pO2, Arterial: 63.2 mmHg — ABNORMAL LOW (ref 83.0–108.0)

## 2020-05-21 LAB — CBC
HCT: 30.1 % — ABNORMAL LOW (ref 36.0–46.0)
Hemoglobin: 9.4 g/dL — ABNORMAL LOW (ref 12.0–15.0)
MCH: 24 pg — ABNORMAL LOW (ref 26.0–34.0)
MCHC: 31.2 g/dL (ref 30.0–36.0)
MCV: 76.8 fL — ABNORMAL LOW (ref 80.0–100.0)
Platelets: 478 10*3/uL — ABNORMAL HIGH (ref 150–400)
RBC: 3.92 MIL/uL (ref 3.87–5.11)
RDW: 25.4 % — ABNORMAL HIGH (ref 11.5–15.5)
WBC: 17.7 10*3/uL — ABNORMAL HIGH (ref 4.0–10.5)
nRBC: 0.7 % — ABNORMAL HIGH (ref 0.0–0.2)

## 2020-05-21 LAB — TSH: TSH: 2.68 u[IU]/mL (ref 0.350–4.500)

## 2020-05-21 LAB — GLUCOSE, CAPILLARY
Glucose-Capillary: 79 mg/dL (ref 70–99)
Glucose-Capillary: 126 mg/dL — ABNORMAL HIGH (ref 70–99)

## 2020-05-21 LAB — MAGNESIUM: Magnesium: 2 mg/dL (ref 1.7–2.4)

## 2020-05-21 LAB — AMMONIA: Ammonia: 25 umol/L (ref 9–35)

## 2020-05-21 LAB — VITAMIN B12: Vitamin B-12: 1918 pg/mL — ABNORMAL HIGH (ref 180–914)

## 2020-05-21 MED ORDER — HYDROMORPHONE HCL 1 MG/ML IJ SOLN
0.5000 mg | INTRAMUSCULAR | Status: DC | PRN
  Administered 2020-05-23 – 2020-05-27 (×9): 0.5 mg via INTRAVENOUS
  Filled 2020-05-21 (×9): qty 0.5

## 2020-05-21 MED ORDER — ACETAMINOPHEN 650 MG RE SUPP: 650.0000 mg | Freq: Four times a day (QID) | RECTAL | Status: DC | PRN

## 2020-05-21 MED ORDER — GLYCOPYRROLATE 0.2 MG/ML IJ SOLN
0.2000 mg | INTRAMUSCULAR | Status: DC | PRN
  Filled 2020-05-21: qty 1

## 2020-05-21 MED ORDER — THIAMINE HCL 100 MG/ML IJ SOLN
500.0000 mg | Freq: Every day | INTRAVENOUS | Status: DC
  Filled 2020-05-21: qty 5

## 2020-05-21 MED ORDER — GLYCOPYRROLATE 1 MG PO TABS
1.0000 mg | ORAL_TABLET | ORAL | Status: DC | PRN
  Filled 2020-05-21: qty 1

## 2020-05-21 MED ORDER — SODIUM CHLORIDE 0.9 % IV SOLN: INTRAVENOUS | Status: DC

## 2020-05-21 MED ORDER — HALOPERIDOL 0.5 MG PO TABS
0.5000 mg | ORAL_TABLET | ORAL | Status: DC | PRN
  Filled 2020-05-21: qty 1

## 2020-05-21 MED ORDER — ONDANSETRON HCL 4 MG/2ML IJ SOLN
4.0000 mg | Freq: Four times a day (QID) | INTRAMUSCULAR | Status: DC | PRN
  Administered 2020-05-26: 4 mg via INTRAVENOUS
  Filled 2020-05-21: qty 2

## 2020-05-21 MED ORDER — SODIUM CHLORIDE 0.9 % IV SOLN
3.0000 g | Freq: Two times a day (BID) | INTRAVENOUS | Status: DC
  Filled 2020-05-21: qty 8

## 2020-05-21 MED ORDER — ACETAMINOPHEN 325 MG PO TABS: 650.0000 mg | ORAL_TABLET | Freq: Four times a day (QID) | ORAL | Status: DC | PRN

## 2020-05-21 MED ORDER — ONDANSETRON 4 MG PO TBDP: 4.0000 mg | ORAL_TABLET | Freq: Four times a day (QID) | ORAL | Status: DC | PRN

## 2020-05-21 MED ORDER — POLYVINYL ALCOHOL 1.4 % OP SOLN
1.0000 [drp] | Freq: Four times a day (QID) | OPHTHALMIC | Status: DC | PRN
  Filled 2020-05-21: qty 15

## 2020-05-21 MED ORDER — BIOTENE DRY MOUTH MT LIQD: 15.0000 mL | OROMUCOSAL | Status: DC | PRN

## 2020-05-21 MED ORDER — HALOPERIDOL LACTATE 2 MG/ML PO CONC
0.5000 mg | ORAL | Status: DC | PRN
  Filled 2020-05-21: qty 0.3

## 2020-05-21 MED ORDER — HALOPERIDOL LACTATE 5 MG/ML IJ SOLN
0.5000 mg | INTRAMUSCULAR | Status: DC | PRN
  Administered 2020-05-24 – 2020-05-25 (×4): 0.5 mg via INTRAVENOUS
  Filled 2020-05-21 (×4): qty 1

## 2020-05-21 NOTE — Progress Notes (Signed)
Pharmacy Antibiotic Note  Morgan Beasley is a 60 y.o. female admitted on 05/17/2020 with sepsis due to right foot osteomyelitis and left foot cellulitis. Scr increasing from 0.9 >> 3.72 today. Patient is currently on Cefepime and Daptomycin. Being asked to switch Cefepime to Unasyn due to concern for encephalopathy/delirium.   Plan:  Unasyn 3g IV q12h  Continue Daptomycin 500mg  IV q48h   Monitor renal function, cultures, clinical course, weekly CK    Height: 5\' 6"  (167.6 cm) Weight: 90 kg (198 lb 6.6 oz) IBW/kg (Calculated) : 59.3  Temp (24hrs), Avg:97.7 F (36.5 C), Min:97.4 F (36.3 C), Max:98 F (36.7 C)  Recent Labs  Lab 05/17/20 0607 05/17/20 0608 05/17/20 0639 05/17/20 0645 05/18/20 0950 05/19/20 0420 05/19/20 0759 05/19/20 1130 05/20/20 0305 05/21/20 0502  WBC 13.6*  --   --   --  11.3* 15.4*  --   --  14.2* 17.7*  CREATININE 1.09*  --   --    < > 1.77* 2.69*  --  3.01* 3.44* 3.72*  LATICACIDVEN  --  1.6 1.9  --   --   --   --   --   --   --   VANCORANDOM  --   --   --   --   --   --  24  --   --   --    < > = values in this interval not displayed.    Estimated Creatinine Clearance: 18.6 mL/min (A) (by C-G formula based on SCr of 3.72 mg/dL (H)).    Allergies  Allergen Reactions  . Tramadol Swelling  . Nsaids Other (See Comments)    Pancreatitis  . Tolmetin Other (See Comments)    Pancreatitis  . Tylenol [Acetaminophen] Other (See Comments)    unknown  . Aspirin Other (See Comments)    "Makes my pancreas act up"     Antimicrobials this admission: 1/17 Zosyn x 1 1/17 Vancomycin >> 1/18 1/17 Ceftriaxone >> 1/18  1/18 Cefepime >> 1/21 1/20 Daptomycin >> 1/21 Unasyn >>   Microbiology results: 1/17 BCx: NGTD 1/17 resp panel: negative     Thank you for allowing pharmacy to be a part of this patient's care.   Lindell Spar, PharmD, BCPS Clinical Pharmacist  05/21/2020 2:37 PM
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
## 2020-05-21 NOTE — TOC Progression Note (Signed)
Transition of Care [facility]) - Progression Note    Patient Details  Name: Morgan Beasley MRN: [id number] Date of Birth: [birth year]  Transition [facility]) CM/SW Contact  Demetrious Rainford, [name], RN Phone Number: 05/21/2020, 9:36 AM  Clinical Narrative:PT recc SNF;Will await closer to medical stability before faxing out to SNF to have an accurate assessment of services that can be provided @ SNF level.     Expected Discharge Plan: [place] Barriers to Discharge: Continued Medical Work up  Expected Discharge Plan and Services Expected Discharge Plan: [place]   Discharge Planning Services: CM Consult Post Acute Care Choice: [place] Living arrangements for the past 2 months: Single Family Home Expected Discharge Date: 05/20/20                                     Social Determinants of Health (SDOH) Interventions    Readmission Risk Interventions Readmission Risk Prevention Plan 05/20/2020 03/18/2020 10/21/2019  Transportation Screening Complete Complete Complete  Medication Review (RN Care Manager) Complete - Complete  PCP or Specialist appointment within 3-5 days of discharge Complete Not Complete Complete  PCP/Specialist Appt Not Complete comments - First available appt 12/1 -  [place] or Home Care Consult Complete Complete Complete  SW Recovery Care/Counseling Consult Complete Complete Complete  Palliative Care Screening Not Applicable Not Applicable Not Applicable  Skilled Nursing Facility Complete Not Applicable Not Applicable  Some recent data might be hidden

## 2020-05-21 NOTE — Progress Notes (Signed)
OT Cancellation Note  Patient Details Name: Morgan Beasley MRN: [id number] DOB: 03/02/1961   Cancelled Treatment:    Reason Eval/Treat Not Completed: Medical issues which prohibited therapy. Per RN patient more lethargic, will not open eyes and will not follow commands. Will hold OT evaluation and f/u when patient more appropriate for therapy.  Destry Dauber L Pamla Pangle 05/21/2020, 1:57 PM

## 2020-05-21 NOTE — Plan of Care (Signed)
Note plans for transition to comfort measures - nephrology will sign off.  Appreciate palliative care and primary teams and the staff supporting the family and patient  Morgan HEYMANN, MD 05/21/2020 4:02 PM

## 2020-05-21 NOTE — Progress Notes (Signed)
Received a secure chat message from patient's nurse with concern about patient's mental status and breathing and overall decline.   Patient was sleeping.  She briefly wakes to voice but does not interact with others similar to when I saw her earlier in the morning.  She does not follows command but moves extremities spontaneously.   Vitals:   05/20/20 2247 05/21/20 0500 05/21/20 0615 05/21/20 0952  BP: (!) 137/98  (!) 125/97 (!) 150/88  Pulse: (!) 103  92 99  Resp: 20  16 20   Temp: 98 F (36.7 C)  97.8 F (36.6 C) (!) 97.4 F (36.3 C)  TempSrc: Oral   Axillary  SpO2: 96%  92% 100%  Weight:  90 kg    Height:       GENERAL: No apparent distress. HEENT: MMM.  Vision and hearing grossly intact.  NECK: Supple.  No nuchal rigidity.  No apparent JVD but limited exam.  RESP: On RA.  No IWOB.  Fair aeration but limited exam due to body habitus. CVS:  RRR.  Slightly tachycardic but regular. ABD/GI/GU: BS+. Abd soft, NTND.  MSK/EXT:  Moves extremities.  No apparent edema.  Dry gangrene is as noted previously SKIN: Skin wound over RLE.  NEURO: Sleepy but wakes to voice briefly.  Does not follows commands or interact.  No facial asymmetry.  PERRL.  Moves extremities.  PSYCH: Sleepy.  No agitation.   Assessment and plan Encephalopathy-suspect delirium.  Other potential etiologies include cefepime, renal failure although no significant azotemia, CVA although no focal neuro deficits, silent seizures.  She is not on sedating medications. -Continue daptomycin for osteomyelitis/cellulitis -Change cefepime to Unasyn-consult pharmacy for dosing -CT head without contrast -Basic encephalopathy labs including ABG, TSH, B12, ammonia -Check spot EEG -Start high-dose thiamine empirically  Goal of care: Patient with multiple comorbidities and progressive decline.  I have discussed and my concern about her CODE STATUS with patient's daughter, [name] who has been listed as patient's contact. [name] says  patient has DNR paper at home, and she will look and bring it to the hospital. She also claims [facility] POA for her mother although she doesn't have paper signed for this.   -CODE STATUS changed to DNR/DNI -Palliative medicine to see patient later today for further goal of care discussion  See the progress note from earlier this morning for other medical issues.  Additional 30 minutes evaluating patient and talking to patient's family

## 2020-05-21 NOTE — Progress Notes (Signed)
Pt continues with decreased mentation in comparison to Wednesday. Minimally responsive, will open eyes but will not focus on anything. Will not follow commands. MD Cyndia Skeeters and Palliative MD made aware of assessment findings. See new orders.
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
## 2020-05-21 NOTE — Plan of Care (Signed)
?  Problem: Clinical Measurements: ?Goal: Will remain free from infection ?Outcome: Progressing ?  ?Problem: Skin Integrity: ?Goal: Risk for impaired skin integrity will decrease ?Outcome: Progressing ?  ?Problem: Safety: ?Goal: Ability to remain free from injury will improve ?Outcome: Progressing ?  ?

## 2020-05-21 NOTE — Progress Notes (Addendum)
I discussed / reviewed the pharmacy note by Durward Fortes and I agree with the student's findings and plans as documented. Napoleon Form 05/21/20 8:54 AM   Pharmacy Antibiotic Note  Morgan Beasley is a 60 y.o. female admitted on 05/17/2020 with sepsis due to right foot osteomyelitis and left foot cellulitis. 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 Scr increasing from 0.9>>3.72 today. Patient is currently on cefepime and daptomycin.   Plan:  Continue cefepime 2g IV q24h  Continue daptomycin 500mg  IV q48h   Will f/u renal function for dose adjustment   Will f/u culture results and clinical course    Height: 5\' 6"  (167.6 cm) Weight: 90 kg (198 lb 6.6 oz) IBW/kg (Calculated) : 59.3  Temp (24hrs), Avg:97.8 F (36.6 C), Min:97.5 F (36.4 C), Max:98.2 F (36.8 C)  Recent Labs  Lab 05/17/20 0607 05/17/20 0608 05/17/20 0639 05/17/20 0645 05/18/20 0950 05/19/20 0420 05/19/20 0759 05/19/20 1130 05/20/20 0305 05/21/20 0502  WBC 13.6*  --   --   --  11.3* 15.4*  --   --  14.2* 17.7*  CREATININE 1.09*  --   --    < > 1.77* 2.69*  --  3.01* 3.44* 3.72*  LATICACIDVEN  --  1.6 1.9  --   --   --   --   --   --   --   VANCORANDOM  --   --   --   --   --   --  24  --   --   --    < > = values in this interval not displayed.    Estimated Creatinine Clearance: 18.6 mL/min (A) (by C-G formula based on SCr of 3.72 mg/dL (H)).    Allergies  Allergen Reactions   Tramadol Swelling   Nsaids Other (See Comments)    Pancreatitis   Tolmetin Other (See Comments)    Pancreatitis   Tylenol [Acetaminophen] Other (See Comments)    unknown   Aspirin Other (See Comments)    "Makes my pancreas act up"     Antimicrobials this admission: 1/20 daptomycin >> 1/18 cefepime >> 1/17 Zosyn x 1  1/17 vancomycin >> 1/18 1/17 ceftriaxone >> 1/18    Microbiology results: 1/17 BCx: NGTD 1/17 resp panel: negative     Thank you for allowing pharmacy to be a part of this patients  care.  Durward Fortes, PharmD Student

## 2020-05-22 DIAGNOSIS — R6520 Severe sepsis without septic shock: Secondary | ICD-10-CM

## 2020-05-22 DIAGNOSIS — Z515 Encounter for palliative care: Secondary | ICD-10-CM

## 2020-05-22 LAB — CULTURE, BLOOD (ROUTINE X 2): Culture: NO GROWTH
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
## 2020-05-22 NOTE — Progress Notes (Signed)
Manufacturing engineer [facility])  Referral received for residential hospice at [facility].  ACC will review patient with our MD to determine if pt is eligible for residential hospice.  There is not a bed to offer today or likely tomorrow.  ACC will update TOC as well as family once eligibility has been confirmed by our MD.  Thank you for this referral, Venia Carbon RN, BSN, [facility] Liaison

## 2020-05-22 NOTE — TOC Progression Note (Signed)
Transition of Care [facility]) - Progression Note    Patient Details  Name: Morgan Beasley MRN: [id number] Date of Birth: February 13, 1961  Transition of Care [facility]) CM/SW [place], LCSW Phone Number: 05/22/2020, 4:52 PM  Clinical Narrative:    CSW reached out to the patient daughter [name] to discuss residential hospice. [name] conference called her sister [name].  CSW explained the process and provided options. Daughter is familiar with Research scientist (physical sciences).  CSW made a referral to [facility] liaison Venia Carbon. She will follow up with the daughter.    Expected Discharge Plan: [place] Barriers to Discharge: Continued Medical Work up  Expected Discharge Plan and Services Expected Discharge Plan: [place]   Discharge Planning Services: CM Consult Post Acute Care Choice: [place] Living arrangements for the past 2 months: Single Family Home Expected Discharge Date: 05/20/20                                     Social Determinants of Health (SDOH) Interventions    Readmission Risk Interventions Readmission Risk Prevention Plan 05/20/2020 03/18/2020 10/21/2019  Transportation Screening Complete Complete Complete  Medication Review (RN Care Manager) Complete - Complete  PCP or Specialist appointment within 3-5 days of discharge Complete Not Complete Complete  PCP/Specialist Appt Not Complete comments - First available appt 12/1 -  [place] or Home Care Consult Complete Complete Complete  SW Recovery Care/Counseling Consult Complete Complete Complete  Palliative Care Screening Not Applicable Not Applicable Not Applicable  Skilled Nursing Facility Complete Not Applicable Not Applicable  Some recent data might be hidden

## 2020-05-23 NOTE — Progress Notes (Signed)
Manufacturing engineer [facility]) Hospital Liaison note.    Received request from [place] for family interest in [facility]. [facility] liaison spoke with daughter, [name] to confirm interest and answer questions.  Patient information has been forwarded to [facility] for review.    [place] liaison will follow up once eligibility and availability have been determined.    A Please do not hesitate to call with questions.     Thank you,    Farrel Gordon, RN, [place] (listed on [facility] under [place])     [phone]
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
## 2020-05-24 MED ORDER — LIP MEDEX EX OINT
TOPICAL_OINTMENT | CUTANEOUS | Status: AC
  Filled 2020-05-24: qty 7

## 2020-05-24 NOTE — Care Management Important Message (Signed)
Medicare important message printed for Kathy Mahabir, NCM to give to the patient. 

## 2020-05-24 NOTE — TOC Progression Note (Addendum)
Transition of Care [facility]) - Progression Note    Patient Details  Name: Morgan Beasley MRN: [id number] Date of Birth: 1960-09-19  Transition of [facility]) CM/SW Contact  Weber Monnier, [name], RN Phone Number: 05/24/2020, 10:31 AM  Clinical Narrative:  [facility] waiting on bed availability-no beds today.     Expected Discharge Plan: [place] Barriers to Discharge: Continued Medical Work up  Expected Discharge Plan and Services Expected Discharge Plan: [place]   Discharge Planning Services: CM Consult Post Acute Care Choice: [place] Living arrangements for the past 2 months: Single Family Home Expected Discharge Date: 05/20/20                                     Social Determinants of Health (SDOH) Interventions    Readmission Risk Interventions Readmission Risk Prevention Plan 05/20/2020 03/18/2020 10/21/2019  Transportation Screening Complete Complete Complete  Medication Review (RN Care Manager) Complete - Complete  PCP or Specialist appointment within 3-5 days of discharge Complete Not Complete Complete  PCP/Specialist Appt Not Complete comments - First available appt 12/1 -  [place] or Home Care Consult Complete Complete Complete  SW Recovery Care/Counseling Consult Complete Complete Complete  Palliative Care Screening Not Applicable Not Applicable Not Applicable  Skilled Nursing Facility Complete Not Applicable Not Applicable  Some recent data might be hidden
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
## 2020-05-24 NOTE — Progress Notes (Signed)
[facility] ([facility]) Hospital Liaison note.   ° °[facility] is unable to offer a room today. Hospice liaison will update with any change. Please do not hesitate to call with questions.     °A Please do not hesitate to call with questions.     ° °Thank you,     °Mary Anne Robertson, RN, CCM        °[facility] Hospital Liaison (listed on AMION under Hospice /[facility])      °[phone]  °

## 2020-05-25 NOTE — Progress Notes (Signed)
[facility]) Hospital Liaison note.    [facility] is unable to offer a room today. Hospice liaison will update with any change. Please do not hesitate to call with questions.      A Please do not hesitate to call with questions.      Thank you,     Clementeen Hoof, BSN, RN     [facility] (listed on AMION under [facility])      [phone]

## 2020-05-25 NOTE — Progress Notes (Signed)
Chaplain engaged in initial visit with Morgan Beasley.  Chaplain offered prayer over Morgan Beasley.  During visit, Morgan Beasley strived to voice several things to chaplain.  Morgan Beasley wanted chaplain to know that she was dirty.  Morgan Beasley had "soiled" herself and was trying to let someone know.  She also wanted chaplain to know she was thirsty.  Morgan Beasley had been calling out for someone to bring her something to drink while chaplain was in another room.  Chaplain was able to speak with a nurse tech about what Morgan Beasley needed.  Morgan Beasley conveyed several times that she shouldn't have to be like this - soiled from a bowel movement and thirsty.  She also was trying to tell chaplain about an open cut on her foot that was bleeding and that she was in pain.    Chaplain offered support and strived to let Morgan Beasley know that she would get someone to help her.  Chaplain worked to advocate on behalf of Morgan Beasley.    05/25/20 1300  Clinical Encounter Type  Visited With Patient  Visit Type Initial

## 2020-05-25 NOTE — TOC Progression Note (Signed)
Transition of Care [facility]) - Progression Note    Patient Details  Name: Morgan Beasley MRN: [id number] Date of Birth: Aug 12, 1960  [facility] CM/SW Contact  Alasia Enge, [name], RN Phone Number: 05/25/2020, 3:17 PM [facility] following for residential hospice-no beds available today.     Expected Discharge Plan: [place] Barriers to Discharge: Continued Medical Work up  Expected Discharge Plan and Services Expected Discharge Plan: [place]   Discharge Planning Services: CM Consult Post Acute Care Choice: [place] Living arrangements for the past 2 months: Single Family Home Expected Discharge Date: 05/20/20                                     Social Determinants of Health (SDOH) Interventions    Readmission Risk Interventions Readmission Risk Prevention Plan 05/20/2020 03/18/2020 10/21/2019  Transportation Screening Complete Complete Complete  Medication Review (RN Care Manager) Complete - Complete  PCP or Specialist appointment within 3-5 days of discharge Complete Not Complete Complete  PCP/Specialist Appt Not Complete comments - First available appt 12/1 -  [place] or Home Care Consult Complete Complete Complete  SW Recovery Care/Counseling Consult Complete Complete Complete  Palliative Care Screening Not Applicable Not Applicable Not Applicable  Skilled Nursing Facility Complete Not Applicable Not Applicable  Some recent data might be hidden

## 2020-05-25 NOTE — Progress Notes (Signed)
Nutrition Brief Note  Patient screened for MST. Chart reviewed. MD note from 1/24 states that patient transitioned to full comfort care on 1/21 and that plan is for discharge to residential hospice at [facility].  Pt is comfort care.   No further nutrition interventions warranted at this time. Please consult as needed.       Jarome Matin, MS, RD, LDN, CNSC Inpatient Clinical Dietitian RD pager # available in [place]  After hours/weekend pager # available in [facility]
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
## 2020-05-26 LAB — CARBAMAZEPINE, FREE AND TOTAL
Carbamazepine, Free: UNDETERMINED ug/mL
Carbamazepine, Total: 10.9 ug/mL (ref 4.0–12.0)

## 2020-05-26 NOTE — Progress Notes (Signed)
Manufacturing engineer [facility]) Hospital Liaison Note:  Unfortunately Morgan Beasley is not able to offer a room today. An Barrister's clerk will follow up with TOC and family tomorrow or sooner if room becomes available.   Please do not hesitate to call with questions.  Thank yo,.   Gar Ponto, RN [facility] Liaison (on [place]) [phone]

## 2020-05-26 NOTE — Plan of Care (Signed)
  Problem: Activity: Goal: Risk for activity intolerance will decrease Outcome: Not Progressing   Problem: Nutrition: Goal: Adequate nutrition will be maintained Outcome: Not Progressing   Problem: Skin Integrity: Goal: Risk for impaired skin integrity will decrease Outcome: Not Progressing   
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
## 2020-05-26 NOTE — Progress Notes (Signed)
   Referral reviewed at request of the CM [name] to see if he would be a candidate for our inpt facility at [facility] or [facility] branch. I did review this and discuses with my MD and the pt is felt to be appropriate.Meda Coffee called [name] to update her and let her know we do have a bed and can accept pt today. However, she reached out to the daughter and the daughter feels this is to far for her family to travel. We respect and honor the pt's and family wishes.   Thank you Webb Silversmith RN [phone]

## 2020-05-26 NOTE — TOC Progression Note (Addendum)
Transition of Care [facility]) - Progression Note    Patient Details  Name: Morgan Beasley MRN: [id number] Date of Birth: 1960/11/19  Transition of Care [facility]) CM/SW Contact  Jacquis Paxton, [name], RN Phone Number: 05/26/2020, 10:51 AM  Clinical Narrative: TC [place] beds available today.-[facility] rep [facility] has a bed available @ 2 locations but [name] dtr states this is too far for family to travel. Will continue to check w/[facility] for bed availability.        Expected Discharge Plan: [place] Barriers to Discharge: Continued Medical Work up  Expected Discharge Plan and Services Expected Discharge Plan: [place]   Discharge Planning Services: CM Consult Post Acute Care Choice: [place] Living arrangements for the past 2 months: Single Family Home Expected Discharge Date: 05/20/20                                     Social Determinants of Health (SDOH) Interventions    Readmission Risk Interventions Readmission Risk Prevention Plan 05/20/2020 03/18/2020 10/21/2019  Transportation Screening Complete Complete Complete  Medication Review (RN Care Manager) Complete - Complete  PCP or Specialist appointment within 3-5 days of discharge Complete Not Complete Complete  PCP/Specialist Appt Not Complete comments - First available appt 12/1 -  [place] or Home Care Consult Complete Complete Complete  SW Recovery Care/Counseling Consult Complete Complete Complete  Palliative Care Screening Not Applicable Not Applicable Not Applicable  Skilled Nursing Facility Complete Not Applicable Not Applicable  Some recent data might be hidden

## 2020-05-27 MED ORDER — ONDANSETRON 4 MG PO TBDP: 4.0000 mg | ORAL_TABLET | Freq: Four times a day (QID) | ORAL | 0 refills | Status: AC | PRN

## 2020-05-27 MED ORDER — HALOPERIDOL LACTATE 2 MG/ML PO CONC: 0.6000 mg | ORAL | 0 refills | Status: AC | PRN

## 2020-05-27 MED ORDER — MORPHINE SULFATE (CONCENTRATE) 5 MG/0.25ML PO SOLN: 2.0000 mL | ORAL | 0 refills | Status: DC | PRN

## 2020-05-27 NOTE — Progress Notes (Signed)
Manufacturing engineer [facility]) Hospital Liaison: RN note     Notified by Transition of Care Manger, Dessa Phi, RN of patient/family request for [facility] services at home instead of [facility] at this time.   Writer spoke with daughter, [name]  to initiate education related to hospice philosophy, services for home hospice and team approach to care. [name]  verbalized understanding of information given. Per discussion, plan is for discharge to home by PTAR. Please send signed and completed DNR form home with patient/family. Patient will need prescriptions for discharge comfort medications.      DME needs have been discussed, patient currently has the following equipment in the home:  none.  Patient/family requests the following DME for delivery to the home: hospital bed and oxygen. [place] equipment manager has been notified and will contact DME provider to arrange delivery to the home. Home address has been verified, [address], [place]. [name]  is the family member to contact to arrange time of delivery.      [facility] Referral Center aware of the above. Please notify [facility] when patient is ready to leave the unit at discharge. (Call [phone] or [phone] after 5pm.) [facility] information and contact numbers given to [name].       A Please do not hesitate to call with questions.     Thank you,    Farrel Gordon, RN, [place] (listed on [facility] under [place])     [phone]

## 2020-05-27 NOTE — TOC Transition Note (Signed)
Transition of Care [facility]) - CM/SW Discharge Note   Patient Details  Name: Morgan Beasley MRN: [id number] Date of Birth: 01-25-1961  Transition of Care [facility]) CM/SW Contact:  Dessa Phi, RN Phone Number: 05/27/2020, 2:44 PM   Clinical Narrative: [facility] care unable to provide home nurse visit until Monday-dtr Evelone agreed to [place] for home hospice services-rep Cheri able to accept & make home visit tomorrow early in am-dtr [name] in agreement. dme is already @ home [address] [place] [phone]. Nsg to call PTAR as will call. No further CM needs.     Final next level of care: Home w Hospice Care Barriers to Discharge: No Barriers Identified   Patient Goals and CMS Choice Patient states their goals for this hospitalization and ongoing recovery are:: per dtr [name] need rehab CMS Medicare.gov Compare Post Acute Care list provided to:: Patient Represenative (must comment) ([name] dtr [phone]) Choice offered to / list presented to : Adult Children  Discharge Placement                       Discharge Plan and Services   Discharge Planning Services: CM Consult Post Acute Care Choice: Skilled Nursing Facility                    HH Arranged: RN [facility] Agency: [place] Date [place]: 05/27/20 Time [place]: 1443 Representative spoke with at [place]: [place] Determinants of Health ([place]) Interventions     Readmission Risk Interventions Readmission Risk Prevention Plan 05/20/2020 03/18/2020 10/21/2019  Transportation Screening Complete Complete Complete  Medication Review (RN Care Manager) Complete - Complete  PCP or Specialist appointment within 3-5 days of discharge Complete Not Complete Complete  PCP/Specialist Appt Not Complete comments - First available appt 12/1 -  [place] or Home Care Consult Complete Complete Complete  SW Recovery Care/Counseling Consult Complete Complete Complete   Palliative Care Screening Not Applicable Not Applicable Not Applicable  Skilled Nursing Facility Complete Not Applicable Not Applicable  Some recent data might be hidden

## 2020-05-27 NOTE — Progress Notes (Signed)
Pt discharge got delayed because Pt's daughter had questions about her mother not receiving meds. Charge nurse, CW, and MD were notified. Conversations were had, pt got d/c to home.

## 2020-05-27 NOTE — Procedures (Signed)
Spiritual support through spiritual counsel and life review provided to patient and daughter as they spoke of the "roller coaster" experience of the patient's illness. A blessing for strength was provided.

## 2020-05-27 NOTE — Discharge Summary (Signed)
Physician Discharge Summary  Morgan Beasley OEV:[id number] DOB: 06/15/1960 DOA: 05/17/2020  PCP: Riki Sheer, NP  Admit date: 05/17/2020 Discharge date: 05/27/2020  Time spent: 23 minutes  Recommendations for Outpatient Follow-up:  1. Recommend home hospice come into the home and help patient with end-of-life transitions 2. Signed Rx for prescriptions given on discharge 3. Requires 2 L of oxygen on discharge  Discharge Diagnoses:  Principal Problem:   Sepsis ([place]) Active Problems:   COPD (chronic obstructive pulmonary disease) (HCC)   PVD (peripheral vascular disease) (HCC)   Acute on chronic systolic CHF (congestive heart failure) (HCC)   Benign essential HTN   Obstructive sleep apnea (adult) (pediatric)   Uncontrolled type 2 diabetes mellitus with hyperglycemia, with long-term current use of insulin (HCC)   Tobacco dependence   Osteomyelitis of ankle or foot, right, acute ([place])   Non-compliance   Polysubstance abuse ([place])   Discharge Condition: Guarded  Diet recommendation: Comfort related feeds  Filed Weights   05/18/20 0600 05/19/20 0500 05/21/20 0500  Weight: 89.4 kg 88.9 kg 90 kg    History of present illness:  60 year old female with PMH of bipolar disorder, systolic CHF, DM-2, COPD not on oxygen presented to ED on 1/17 with complaints of bilateral foot pain and elevated blood glucose and admitted for hyperglycemia, sepsis due to left foot cellulitis and right foot osteomyelitis.  Started on broad-spectrum antibiotics.   Vascular surgery consulted and recommended transfer to [facility] for further evaluation and treatment.  However, patient deconditioned with multiorgan failure including progressive AKI and encephalopathy.  Palliative medicine consulted.  She was transitioned to full comfort care on 05/21/2020, and waiting on residential hospice at [facility].  Hospital Course:  Severe sepsis secondary to right foot osteomyelitis left foot cellulitis  bilateral gangrene Found prior to admission Seen by vascular surgery-not a candidate for arteriogram as this would worsen kidney function patient had AKI  AKI prior to admission Baseline creatinine 0.9 Was on Lasix and vancomycin during admission which may have contributed renal ultrasound without finding See above discussion  Hypoxic respiratory failure secondary to CHF Resolved but will need 2 L of oxygen at baseline  HTN HLD electrolyte disturbances Not checking labs as [facility] is now hospice  Prior cocaine abuse UDS positive for cocaine opiates  Discharge Exam: Vitals:   05/26/20 1350 05/26/20 1352  BP: (!) 132/96   Pulse: (!) 105   Resp: 18   Temp:  97.6 F (36.4 C)  SpO2: 100%    Awake alert coherent minimally no distress EOMI NCAT no focal deficit S1-S2 no murmur no rub no gallop Chest clear no added sound no rales no rhonchi Abdomen soft Significant darkening of both lower extremities with eschar under left heel  Discharge Instructions   Discharge Instructions    Diet - low sodium heart healthy   Complete by: As directed    Discharge instructions   Complete by: As directed    Patient will discharge home hospice for end-of-life discussions and care   Discharge wound care:   Complete by: As directed    Wound / Incision (Open or Dehisced) 03/21/20 Toe (Comment  which one) Anterior;Right;Other (Comment) 2nd toe dried wound 66 days  Wound / Incision (Open or Dehisced) 04/18/20 Non-pressure wound Foot Anterior;Left 39 days  Pressure Injury 05/18/20 Ankle Right;Posterior Unstageable - Full thickness tissue loss in which the base of the injury is covered by slough (yellow, tan, gray, green or brown) and/or eschar (tan, brown or black) in  the wound bed. 9 days  Pressure Injury 05/18/20 Heel Right Unstageable - Full thickness tissue loss in which the base of the injury is covered by slough (yellow, tan, gray, green or brown) and/or eschar (tan, brown or black) in  the wound bed. 9 days  Wound / Incision (Open or Dehisced) 05/17/20 Diabetic ulcer Toe (Comment  which one) Right;Distal Right 5th toe necrotic 9 days  Wound / Incision (Open or Dehisced) 05/17/20 Venous stasis ulcer Heel Posterior;Right dry, necrotic large area to back of heel and extending to lateral leg 9 days   Increase activity slowly   Complete by: As directed      Allergies as of 05/27/2020      Reactions   Tramadol Swelling   Nsaids Other (See Comments)   Pancreatitis   Tolmetin Other (See Comments)   Pancreatitis   Acetaminophen Itching, Other (See Comments)   Aspirin Other (See Comments)   "Makes my pancreas act up"       Medication List    STOP taking these medications   acetaminophen 325 MG tablet Commonly known as: TYLENOL   Adult One Daily Gummies Chew   albuterol 108 (90 Base) MCG/ACT inhaler Commonly known as: VENTOLIN HFA   amitriptyline 100 MG tablet Commonly known as: ELAVIL   atorvastatin 20 MG tablet Commonly known as: LIPITOR   budesonide-formoterol 160-4.5 MCG/ACT inhaler Commonly known as: SYMBICORT   busPIRone 10 MG tablet Commonly known as: BUSPAR   carbamazepine 200 MG tablet Commonly known as: TEGRETOL   clopidogrel 75 MG tablet Commonly known as: Plavix   dicyclomine 10 MG capsule Commonly known as: BENTYL   docusate sodium 100 MG capsule Commonly known as: COLACE   feeding supplement (GLUCERNA SHAKE) Liqd   furosemide 40 MG tablet Commonly known as: LASIX   hydrocortisone cream 1 %   insulin aspart 100 UNIT/ML injection Commonly known as: novoLOG   insulin glargine 100 UNIT/ML injection Commonly known as: Lantus   lipase/protease/amylase 36000 UNITS Cpep capsule Commonly known as: Creon   metoprolol succinate 50 MG 24 hr tablet Commonly known as: TOPROL-XL   multivitamin with minerals Tabs tablet   Narcan 4 MG/0.1ML Liqd nasal spray kit Generic drug: naloxone   nicotine 14 mg/24hr patch Commonly known as:  NICODERM CQ - dosed in mg/24 hours   OLANZapine 7.5 MG tablet Commonly known as: ZYPREXA   polyethylene glycol 17 g packet Commonly known as: MIRALAX / GLYCOLAX   simethicone 80 MG chewable tablet Commonly known as: MYLICON     TAKE these medications   haloperidol 2 MG/ML solution Commonly known as: HALDOL Place 0.3 mLs (0.6 mg total) under the tongue every 4 (four) hours as needed for agitation (or delirium).   Morphine Sulfate (Concentrate) 5 MG/0.25ML Soln Take 2 mLs by mouth every 4 (four) hours as needed (severe pain).   ondansetron 4 MG disintegrating tablet Commonly known as: ZOFRAN-ODT Take 1 tablet (4 mg total) by mouth every 6 (six) hours as needed for nausea. What changed:   when to take this  reasons to take this            Discharge Care Instructions  (From admission, onward)         Start     Ordered   05/27/20 0000  Discharge wound care:       Comments: Wound / Incision (Open or Dehisced) 03/21/20 Toe (Comment  which one) Anterior;Right;Other (Comment) 2nd toe dried wound 66 days  Wound / Incision (Open or  Dehisced) 04/18/20 Non-pressure wound Foot Anterior;Left 39 days  Pressure Injury 05/18/20 Ankle Right;Posterior Unstageable - Full thickness tissue loss in which the base of the injury is covered by slough (yellow, tan, gray, green or brown) and/or eschar (tan, brown or black) in the wound bed. 9 days  Pressure Injury 05/18/20 Heel Right Unstageable - Full thickness tissue loss in which the base of the injury is covered by slough (yellow, tan, gray, green or brown) and/or eschar (tan, brown or black) in the wound bed. 9 days  Wound / Incision (Open or Dehisced) 05/17/20 Diabetic ulcer Toe (Comment  which one) Right;Distal Right 5th toe necrotic 9 days  Wound / Incision (Open or Dehisced) 05/17/20 Venous stasis ulcer Heel Posterior;Right dry, necrotic large area to back of heel and extending to lateral leg 9 days   05/27/20 1113          Allergies  Allergen Reactions  . Tramadol Swelling  . Nsaids Other (See Comments)    Pancreatitis  . Tolmetin Other (See Comments)    Pancreatitis  . Acetaminophen Itching and Other (See Comments)  . Aspirin Other (See Comments)    "Makes my pancreas act up"       The results of significant diagnostics from this hospitalization (including imaging, microbiology, ancillary and laboratory) are listed below for reference.    Significant Diagnostic Studies: DG Chest 2 View  Result Date: 05/17/2020 CLINICAL DATA:  60 year old female with lower extremity open wounds. Chest congestion. Preoperative study. EXAM: CHEST - 2 VIEW COMPARISON:  Portable chest 03/23/2020 and earlier. FINDINGS: Semi upright AP and lateral views of the chest. Stable cardiomegaly and mediastinal contours. Visualized tracheal air column is within normal limits. No pneumothorax. No pleural effusion. Regressed although not completely resolved bilateral pulmonary interstitial opacity compared to November. Chronic linear scarring at the left mid lung is stable. No consolidation. No acute osseous abnormality identified. Negative visible bowel gas pattern. IMPRESSION: Improved although not resolved bilateral pulmonary interstitial opacity compared to November. Mild or developing pulmonary interstitial edema is possible. Viral/atypical respiratory infection felt less likely. No pleural effusion or consolidation. Electronically Signed   By: Genevie Ann M.D.   On: 05/17/2020 07:20   CT HEAD WO CONTRAST  Result Date: 05/18/2020 CLINICAL DATA:  Altered mental status and lethargy EXAM: CT HEAD WITHOUT CONTRAST TECHNIQUE: Contiguous axial images were obtained from the base of the skull through the vertex without intravenous contrast. COMPARISON:  03/21/2020 FINDINGS: Brain: Mild atrophic changes and chronic white matter ischemic changes are seen. Previously noted areas of ischemia in the right parietooccipital lobe and left occipital lobe  are stable but better delineated on the current exam. No new focal infarct is seen. No hemorrhage is noted. Vascular: No hyperdense vessel or unexpected calcification. Skull: Normal. Negative for fracture or focal lesion. Sinuses/Orbits: No acute finding. Other: None. IMPRESSION: More well delineated infarcts in the right parietooccipital and left occipital lobes when compared with the prior exam. No new focal infarct is seen. Chronic atrophic and ischemic changes without acute abnormality. Electronically Signed   By: Inez Catalina M.D.   On: 05/18/2020 20:36   US RENAL  Result Date: 05/19/2020 CLINICAL DATA:  Acute kidney injury EXAM: RENAL / URINARY TRACT ULTRASOUND COMPLETE COMPARISON:  None. FINDINGS: Right Kidney: Renal measurements: 12.3 x 5.6 x 5.4 cm = volume: 195.03 mL. Echogenicity within normal limits. No mass or hydronephrosis visualized. Left Kidney: Renal measurements: 12.5 by 6.5 x 6.8 cm = volume: 292.1 mL. Echogenicity within normal limits.  No mass or hydronephrosis visualized. Bladder: Debris/sediment noted within the dependent portion of the bladder. The bladder otherwise appears normal for degree of bladder distention. Other: None. IMPRESSION: 1. No mass or hydronephrosis identified bilaterally. 2. Sediment/debris noted within the dependent portion of the bladder. Electronically Signed   By: Kerby Moors M.D.   On: 05/19/2020 16:02   MR FOOT RIGHT WO CONTRAST  Result Date: 05/17/2020 CLINICAL DATA:  Generalized body aches with reported two necrotic toes. Findings suspicious for osteomyelitis on radiographs. EXAM: MRI OF THE RIGHT FOREFOOT WITHOUT CONTRAST TECHNIQUE: Multiplanar, multisequence MR imaging of the right forefoot was performed without intravenous contrast. Patient was unable to complete the examination, and no post-contrast imaging was obtained. COMPARISON:  Radiographs same date. FINDINGS: Despite efforts by the technologist and patient, moderate to severe motion artifact is  present on today's exam and could not be eliminated. This reduces exam sensitivity and specificity. At least 2 different coils were attempted. Image quality is significantly degraded. Bones/Joint/Cartilage The coronal T1 weighted images are of decent quality. The other sequences are limited, especially the T2 weighted sequences. No definite bone destruction or marrow replacement identified on the T1 weighted images. No large joint effusions. Ligaments The Lisfranc ligament and collateral ligaments of the metatarsophalangeal joints appear intact. Muscles and Tendons No identified abnormality. Soft tissues Generalized subcutaneous edema without focal fluid collection or foreign body. IMPRESSION: 1. Significantly limited examination due to motion artifact. The patient was unable to complete the examination. 2. No definite evidence of osteomyelitis. 3. Generalized subcutaneous edema without focal fluid collection or foreign body. Electronically Signed   By: Richardean Sale M.D.   On: 05/17/2020 13:36   DG Foot Complete Left  Result Date: 05/17/2020 CLINICAL DATA:  60 year old female with open wounds.  Gangrene. EXAM: LEFT FOOT - COMPLETE 3+ VIEW COMPARISON:  Left foot series 02/23/2020. FINDINGS: Increased soft tissue swelling since October in the distal foot. No soft tissue gas. Calcified peripheral vascular disease. Stable osteopenia in the 2nd through 5th metatarsal heads. No cortical osteolysis identified. No acute fracture or dislocation. Bulky calcaneus degenerative spurring is stable. IMPRESSION: 1. Increased soft tissue swelling since October but no acute osseous abnormality or plain radiographic evidence of osteomyelitis. 2. Calcified peripheral vascular disease. Electronically Signed   By: Genevie Ann M.D.   On: 05/17/2020 07:14   DG Foot Complete Right  Result Date: 05/17/2020 CLINICAL DATA:  60 year old female with open wounds. Gangrene. EXAM: RIGHT FOOT COMPLETE - 3+ VIEW COMPARISON:  Right tib fib  series 04/10/2020. FINDINGS: Moderate soft tissue swelling in the distal foot. No soft tissue gas. There is cortical osteolysis at the medial head of the 5th metatarsal (arrow). Adjacent base of the 5th proximal phalanx appears to remain intact. Other metatarsal heads appear intact. No superimposed acute fracture or dislocation identified. Chronic bulky calcaneus degenerative spurring. Subtle loss of the cortex also at the anterior inferior calcaneus and adjacent tarsal bone, perhaps the lateral cuneiform. No additional cortical osteolysis identified. IMPRESSION: 1. Positive for evidence of Osteomyelitis at the medial head of the 5th metatarsal, and possibly also at the anterior inferior calcaneus and adjacent cuneiform (probably lateral cuneiform). 2. Moderate soft tissue swelling. No soft tissue gas. Electronically Signed   By: Genevie Ann M.D.   On: 05/17/2020 07:18   VAS [place] LOWER EXTREMITY ARTERIAL DUPLEX  Result Date: 05/18/2020 LOWER EXTREMITY ARTERIAL DUPLEX STUDY Indications: Gangrene, and peripheral artery disease.  Vascular Interventions: 10/14/2019 - Left BYPASS GRAFT LEFT FEMORAL-POPLITEAL  ARTERY USING NONREVERSED SAPHENOUS VEIN. Current ABI:            03/03/2020 - R 0.55 L 0.93 Limitations: Patient positioning, patient movement, patient somnolence, wounds Comparison Study: No prior studies. Performing Technologist: Oliver Hum RVT  Examination Guidelines: A complete evaluation includes B-mode imaging, spectral Doppler, color Doppler, and power Doppler as needed of all accessible portions of each vessel. Bilateral testing is considered an integral part of a complete examination. Limited examinations for reoccurring indications may be performed as noted.  Fem Fem Graft: +------------------+--------+--------+----------+-------------------+                   PSV cm/sStenosisWaveform  Comments            +------------------+--------+--------+----------+-------------------+  Inflow                                                          +------------------+--------+--------+----------+-------------------+ Prox anastomosis  71              monophasic                    +------------------+--------+--------+----------+-------------------+ Proximal graft    71              monophasic                    +------------------+--------+--------+----------+-------------------+ Mid graft         22              monophasic                    +------------------+--------+--------+----------+-------------------+ Distal graft      43              monophasic                    +------------------+--------+--------+----------+-------------------+ Distal anastomosis                          Unable to visualize +------------------+--------+--------+----------+-------------------+ Outflow                                                         +------------------+--------+--------+----------+-------------------+    +----------+--------+-----+--------+----------+--------+ LEFT      PSV cm/sRatioStenosisWaveform  Comments +----------+--------+-----+--------+----------+--------+ CFA Distal106                  biphasic           +----------+--------+-----+--------+----------+--------+ ATA Distal51                   monophasic         +----------+--------+-----+--------+----------+--------+ PTA Distal46                   monophasic         +----------+--------+-----+--------+----------+--------+  Summary: See table(s) above for measurements and observations. Electronically signed by Deitra Mayo MD on 05/18/2020 at 4:08:12 PM.    Final    [place] EKG SITE RITE  Result Date: 05/18/2020 If Site Rite image not attached, placement could not be confirmed due to current cardiac rhythm.  [place]  EKG SITE RITE  Result Date: 05/17/2020 If Site Rite image not attached, placement could not be confirmed due to current cardiac  rhythm.   Microbiology: No results found for this or any previous visit (from the past 240 hour(s)).   Labs: Basic Metabolic Panel: Recent Labs  Lab 05/21/20 0502  NA 133*  K 4.7  CL 99  CO2 19*  GLUCOSE 92  BUN 41*  CREATININE 3.72*  CALCIUM 8.2*  MG 2.0  PHOS 6.0*   Liver Function Tests: Recent Labs  Lab 05/21/20 0502  ALBUMIN 1.6*   No results for input(s): LIPASE, AMYLASE in the last 168 hours. Recent Labs  Lab 05/21/20 1445  AMMONIA 25   CBC: Recent Labs  Lab 05/21/20 0502  WBC 17.7*  HGB 9.4*  HCT 30.1*  MCV 76.8*  PLT 478*   Cardiac Enzymes: No results for input(s): CKTOTAL, CKMB, CKMBINDEX, TROPONINI in the last 168 hours. BNP: BNP (last 3 results) Recent Labs    03/21/20 0748 03/24/20 0156 05/17/20 0801  BNP 1,352.8* 989.2* 2,905.7*    ProBNP (last 3 results) Recent Labs    12/19/19 1546 02/09/20 0825  PROBNP 1,148* 1,185*    CBG: Recent Labs  Lab 05/20/20 1617 05/20/20 2248 05/20/20 2323 05/21/20 0735 05/21/20 1150  GLUCAP 87 92 77 79 126*       Signed:  Nita Sells MD   [facility] 05/27/2020, 11:14 AM

## 2020-05-27 NOTE — TOC Transition Note (Signed)
Transition of Care [facility]) - CM/SW Discharge Note   Patient Details  Name: Morgan Beasley MRN: [id number] Date of Birth: 1960/07/04  [facility] CM/SW Contact:  Dessa Phi, RN Phone Number: 05/27/2020, 11:41 AM   Clinical Narrative: Damaris Schooner to [name] dtr-agree to home w/hospice-[facility] rep Audrea Muscat able to provide-home hospice services;they will manage arrangements for DME delivery to the home:address:[address] [place] [address]. [name] dtr aware to call nsg station once dme is in the home for PTAR which is called as Will Call-Nsg to call ptar once dme in the home. No further CM needs.      Final next level of care: [place] Barriers to Discharge: No Barriers Identified   Patient Goals and CMS Choice Patient states their goals for this hospitalization and ongoing recovery are:: per dtr [name] need rehab CMS Medicare.gov Compare Post Acute Care list provided to:: Patient Represenative (must comment) ([name] dtr [phone]) Choice offered to / list presented to : Adult Children  Discharge Placement                       Discharge Plan and Services   Discharge Planning Services: CM Consult Post Acute Care Choice: Skilled Nursing Facility                    HH Arranged: RN [facility] Agency: Hospice and [place] Date [place]: 05/27/20 Time [place]: [place] Representative spoke with at [place]: Audrea Muscat  Social Determinants of Health (SDOH) Interventions     Readmission Risk Interventions Readmission Risk Prevention Plan 05/20/2020 03/18/2020 10/21/2019  Transportation Screening Complete Complete Complete  Medication Review (RN Care Manager) Complete - Complete  PCP or Specialist appointment within 3-5 days of discharge Complete Not Complete Complete  PCP/Specialist Appt Not Complete comments - First available appt 12/1 -  [place] or Home Care Consult Complete Complete Complete  SW  Recovery Care/Counseling Consult Complete Complete Complete  Palliative Care Screening Not Applicable Not Applicable Not Applicable  Skilled Nursing Facility Complete Not Applicable Not Applicable  Some recent data might be hidden

## 2020-05-27 NOTE — Plan of Care (Signed)
  Problem: Pain Managment: Goal: General experience of comfort will improve Outcome: Progressing   Problem: Education: Goal: Knowledge of General Education information will improve Description: Including pain rating scale, medication(s)/side effects and non-pharmacologic comfort measures Outcome: Not Progressing   Problem: Coping: Goal: Level of anxiety will decrease Outcome: Not Progressing   Problem: Skin Integrity: Goal: Risk for impaired skin integrity will decrease Outcome: Not Progressing

## 2020-05-27 NOTE — Progress Notes (Addendum)
Manufacturing engineer [facility]) Hospital Liaison: RN note     Manufacturing engineer is unable to provide an admission visit before Sunday. [facility] is unable to offer a room today. Kathy Mahabir RN and family notified. [facility] will notify TOC if this changes.  Awaiting further communication on what family chooses to do.   A Please do not hesitate to call with questions.     Thank you,    Mary Anne Robertson, RN, CCM       [facility] Hospital Liaison (listed on AMION under Hospice /[facility])     [phone]

## 2020-05-27 NOTE — TOC Progression Note (Signed)
Transition of Care [facility]) - Progression Note    Patient Details  Name: Morgan Beasley MRN: [id number] Date of Birth: 21-Mar-1961  Transition of Care [facility]) CM/SW Contact  [name], [name], RN Phone Number: 05/27/2020, 9:07 AM  Clinical Narrative: Per conversation w/dtr [name]-if no bed @ [facility], & she declines [facility]-she agrees to home w/hospice services-referral to [facility] is following-will update this CM on [facility] & home w/hospice services.dme needed-hospital bed.PTAR      Expected Discharge Plan: [place] Barriers to Discharge: Continued Medical Work up  Expected Discharge Plan and Services Expected Discharge Plan: [place]   Discharge Planning Services: CM Consult Post Acute Care Choice: [place] Living arrangements for the past 2 months: Single Family Home Expected Discharge Date: 05/20/20                                     Social Determinants of Health (SDOH) Interventions    Readmission Risk Interventions Readmission Risk Prevention Plan 05/20/2020 03/18/2020 10/21/2019  Transportation Screening Complete Complete Complete  Medication Review (RN Care Manager) Complete - Complete  PCP or Specialist appointment within 3-5 days of discharge Complete Not Complete Complete  PCP/Specialist Appt Not Complete comments - First available appt 12/1 -  [place] or Home Care Consult Complete Complete Complete  SW Recovery Care/Counseling Consult Complete Complete Complete  Palliative Care Screening Not Applicable Not Applicable Not Applicable  Skilled Nursing Facility Complete Not Applicable Not Applicable  Some recent data might be hidden

## 2020-05-27 NOTE — TOC Progression Note (Addendum)
Transition of Care [facility]) - Progression Note    Patient Details  Name: Morgan Beasley MRN: [id number] Date of Birth: 06/20/1960  Transition of Care [facility]) CM/SW Contact  Jhordan Kinter, [name], RN Phone Number: 05/27/2020, 4:24 PM  Clinical Narrative:Received message from nsg that patient's dtr Morgan Beasley is asking questions about d/c, & why she is not receiving certain meds, & treatment, they don't understand why she is not getting full treatment. CM went into rm & spoke to dtr Morgan Beasley, while Morgan Beasley (dtr that is the contact listed, & contact I have been talking to from admission) on phone-presented medicare IM notice-they have agreed to appeal the d/c-informed them of the process for appeal-they voiced understanding. Await appeal. MD/Nsg notified. Also informed nurse to cancel [facility] rep Cheri notified. 5p-spoke to dtrs Morgan Beasley/Morgan Beasley about difference with [facility] vs home hospice services they agreed to home w/hospice services under [facility]. [facility] for transport-Nsg to call.      Expected Discharge Plan: Home w Hospice Care Barriers to Discharge: No Barriers Identified  Expected Discharge Plan and Services Expected Discharge Plan: [place]   Discharge Planning Services: CM Consult Post Acute Care Choice: [place] [place] Living arrangements for the past 2 months: Single Family Home Expected Discharge Date: 05/27/20                         HH Arranged: RN [place] Agency: [place] Date [place]: 05/27/20 Time [place]: 30 Representative spoke with at [place]: [place] Determinants of Health ([place]) Interventions    Readmission Risk Interventions Readmission Risk Prevention Plan 05/20/2020 03/18/2020 10/21/2019  Transportation Screening Complete Complete Complete  Medication Review (RN Care Manager) Complete - Complete  PCP or Specialist appointment within 3-5 days of discharge Complete Not Complete  Complete  PCP/Specialist Appt Not Complete comments - First available appt 12/1 -  [place] or Home Care Consult Complete Complete Complete  SW Recovery Care/Counseling Consult Complete Complete Complete  Palliative Care Screening Not Applicable Not Applicable Not Applicable  Skilled Nursing Facility Complete Not Applicable Not Applicable  Some recent data might be hidden

## 2020-05-27 NOTE — Care Management Important Message (Signed)
Important Message  Patient Details IM Letter given to Patient's sister Learta Codding and verbal given to [place] Name: Jenay Morici MRN: [id number] Date of Birth: 1961-04-06   Medicare Important Message Given:  Yes     Kerin Salen 05/27/2020, 1:07 PM

## 2020-05-28 ENCOUNTER — Other Ambulatory Visit: Payer: Self-pay | Admitting: Family Medicine

## 2020-05-28 MED ORDER — MORPHINE SULFATE (CONCENTRATE) 5 MG/0.25ML PO SOLN: 2.0000 mL | ORAL | 0 refills | Status: AC | PRN

## 2020-06-29 DEATH — deceased

## 2021-02-16 IMAGING — CT CT ANGIO AOBIFEM WO/W CM
1 of 7 series · 5 of 16 positions shown, 7 images · non-contrast
Comparison: CT dated October 09, 2019. CT PE study dated 02/25/2019

CLINICAL DATA: Chest pain.  Peripheral vascular disease.

EXAM:
CT ANGIOGRAPHY OF ABDOMINAL AORTA WITH ILIOFEMORAL RUNOFF
TECHNIQUE: Multidetector CT imaging of the abdomen, pelvis and lower
extremities was performed using the standard protocol during bolus
administration of intravenous contrast. Multiplanar CT image
reconstructions and MIPs were obtained to evaluate the vascular
anatomy.

[Series 7: cta runoff (id) · axial · 0.98mm/px · z∈[-1226,-218]mm · 5 of 506 slices shown, 7 images]
[im 85/506  soft-tissue]
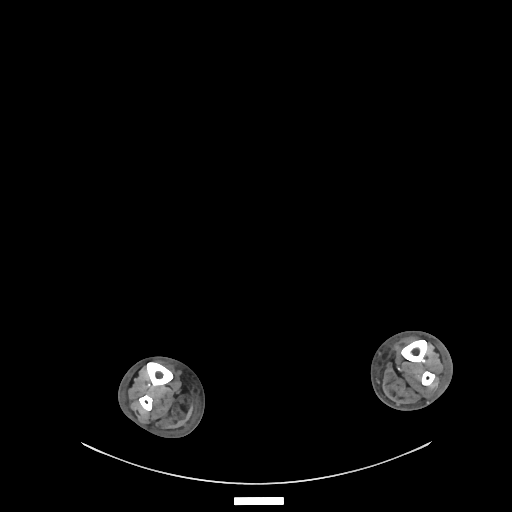
[im 85/506  bone]
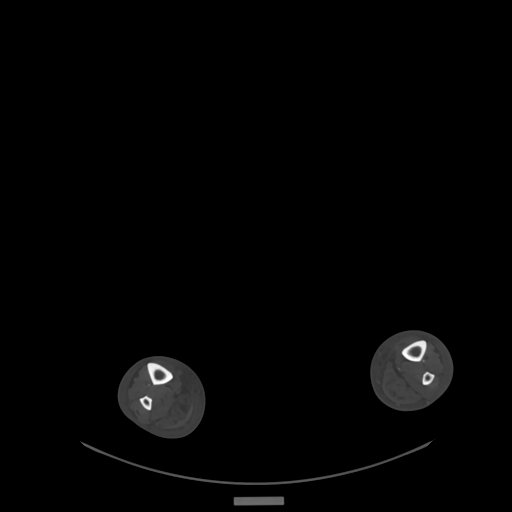
[im 169/506  soft-tissue]
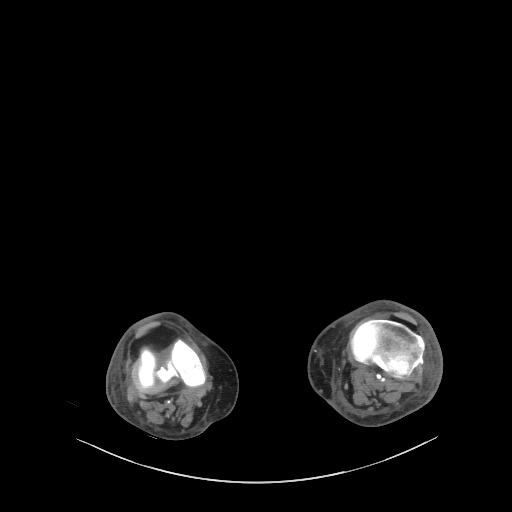
[im 253/506  soft-tissue]
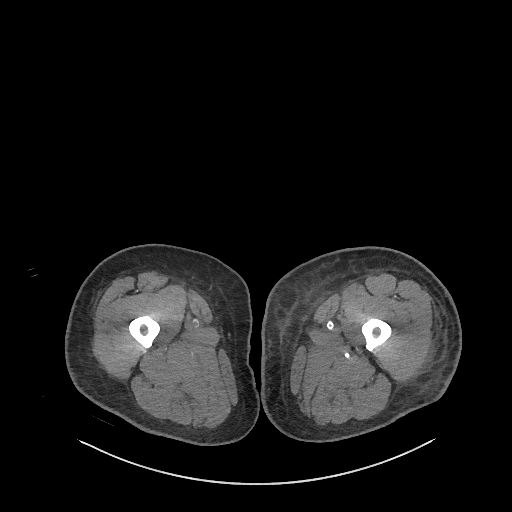
[im 337/506  soft-tissue]
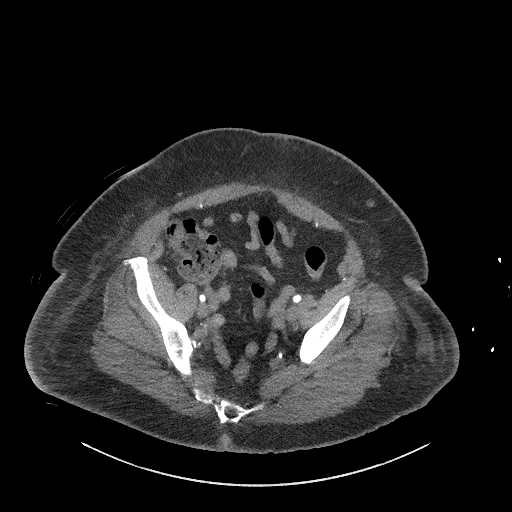
[im 421/506  soft-tissue]
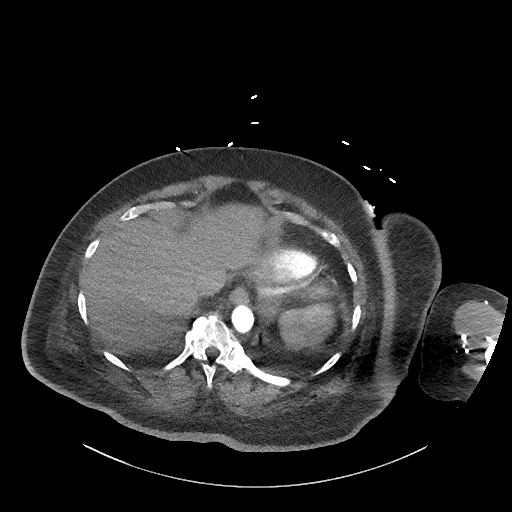
[im 421/506  bone]
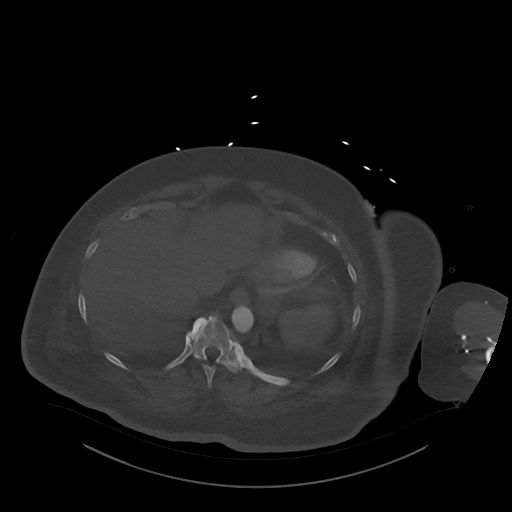

[5 of 16 positions shown; findings below may reference images not displayed]

Multidetector CT imaging of the abdomen, pelvis and lower
extremities was performed using the standard protocol during bolus
administration of intravenous contrast. Multiplanar CT image
reconstructions and MIPs were obtained to evaluate the vascular
anatomy.

CONTRAST:  100mL OMNIPAQUE IOHEXOL 350 MG/ML SOLN
FINDINGS: CHEST

Cardiovascular: There is no evidence for large centrally located
pulmonary embolism. Detection of smaller pulmonary emboli is limited
by technique. There is no evidence for a thoracic aortic dissection
or aneurysm. The heart size is enlarged. There is no significant
pericardial effusion. There are mild coronary artery calcifications.

Mediastinum/Nodes:

--there are enlarged mediastinal lymph nodes. For example there is a
pretracheal lymph node measuring approximately 1.8 x 2.9 cm (axial
series 7, image 40). This is relatively similar to prior study.

--mild hilar adenopathy is noted.

-- No axillary lymphadenopathy.

--there is significant right supraclavicular adenopathy which has
progressed since the prior study. The dominant lymph node measures
1.2 by 3 point 4 cm (axial series 7, image 15).

-- Normal thyroid gland where visualized.

-  Unremarkable esophagus.

Lungs/Pleura: There is a moderate to large right-sided pleural
effusion. There is a small left-sided pleural effusion. There is
bibasilar atelectasis. There is no pneumothorax.

Musculoskeletal: No chest wall abnormality. No bony spinal canal
stenosis.

Review of the MIP images confirms the above findings.

VASCULAR

Aorta: There are atherosclerotic changes of the abdominal aorta
without evidence for an aneurysm.

Celiac: Patent without evidence of aneurysm, dissection, vasculitis
or significant stenosis.

SMA: Patent without evidence of aneurysm, dissection, vasculitis or
significant stenosis.

Renals: Both renal arteries are patent without evidence of aneurysm,
dissection, vasculitis, fibromuscular dysplasia or significant
stenosis.

IMA: Patent without evidence of aneurysm, dissection, vasculitis or
significant stenosis.

RIGHT Lower Extremity

Inflow: Common, internal and external iliac arteries are patent
without evidence of aneurysm, dissection, vasculitis or significant
stenosis.

Outflow: The right common femoral artery is patent. The right SFA is
occluded proximally. There is near immediate reconstitution. There
are tandem high-grade areas of stenosis throughout the right SFA.
There is new complete occlusion of the mid to distal right SFA
(axial series 7, image 247). Evaluation of the right popliteal
artery is limited, however there appears to be high-grade stenosis
throughout the vessel.

Runoff: There is a high-grade stenosis of the proximal right
anterior tibial artery. There appears to be a single vessel runoff
to the right lower extremity via the anterior tibial artery.

LEFT Lower Extremity

Inflow: Common, internal and external iliac arteries are patent
without evidence of aneurysm, dissection, vasculitis or significant
stenosis.

Outflow: The left CFA is widely patent. The proximal left SFA is
entirely occluded. There is a new left femoral to below knee
popliteal artery bypass graft. The graft is widely patent throughout
its course. The profunda femoris artery is patent.

Runoff: Heavy calcifications limit evaluation of the tibial
vasculature. However there appears to be a 2 vessel runoff via the
anterior tibial and posterior tibial arteries. The peroneal artery
is occluded.

Veins: The veins are not well evaluated on this study.

Review of the MIP images confirms the above findings.

NON-VASCULAR

Hepatobiliary: There is hepatomegaly with likely underlying hepatic
steatosis. There appears to be gallbladder wall thickening with
pericholecystic free fluid.There is no biliary ductal dilation.

Pancreas: Normal contours without ductal dilatation. No
peripancreatic fluid collection.

Spleen: Unremarkable.

Adrenals/Urinary Tract:

--Adrenal glands: Unremarkable.

--Right kidney/ureter: No hydronephrosis or radiopaque kidney
stones.

--Left kidney/ureter: No hydronephrosis or radiopaque kidney stones.

--Urinary bladder: Unremarkable.

Stomach/Bowel:

--Stomach/Duodenum: No hiatal hernia or other gastric abnormality.
Normal duodenal course and caliber.

--Small bowel: Unremarkable.

--Colon: Unremarkable.

--Appendix: Normal.

Lymphatic:

--No retroperitoneal lymphadenopathy.

--No mesenteric lymphadenopathy.

--there is mild bilateral inguinal adenopathy, left worse than
right.

Reproductive: Unremarkable

Other: No ascites or free air. The abdominal wall is normal.

Musculoskeletal. There are postsurgical changes of the left inguinal
region. There is bilateral nonspecific lower extremity edema, left
worse than right. There are small bilateral suprapatellar joint
effusions.
IMPRESSION: 1. Patent left lower extremity femoral to below knee popliteal
artery bypass graft. Evaluation of the left tibial vasculature is
limited by calcifications, however there appears to be a 2 vessel
runoff to the left ankle via the anterior tibial and posterior
tibial arteries.
2. Progressive peripheral vascular disease involving the right lower
extremity. There is now complete occlusion of the mid to distal
right superficial femoral artery, new since prior study. There are
now appears to be a single vessel runoff to the right foot via the
anterior tibial artery. In [REDACTED], there was a 3 vessel runoff to the
right foot.
3. Nonspecific, asymmetric bilateral lower extremity edema (left
worse than right). This is of unknown clinical significance. This
study cannot adequately exclude a DVT. If there is clinical
suspicion for a DVT, follow-up with ultrasound is recommended.
4. No evidence for an acute pulmonary embolism. No evidence for
thoracic aortic dissection or aneurysm.
5. Cardiomegaly with small to moderate-sized bilateral pleural
effusions, right worse than left.
6. Persistent mediastinal and hilar adenopathy with worsening right
supraclavicular adenopathy. Findings may be reactive or due to a
lymphoproliferative disorder or metastatic disease. Follow-up is
recommended. Of note, the right supraclavicular lymph node is
amenable to percutaneous ultrasound-guided biopsy if clinically
indicated.
7. Hepatomegaly with likely underlying hepatic steatosis.
8.  Aortic Atherosclerosis ([id number]).

## 2021-02-18 IMAGING — DX DG CHEST 1V PORT
1 series · 1 of 1 positions shown · non-contrast
Comparison: Portable exam 1554 hours compared to 02/25/2020

CLINICAL DATA: Shortness of breath; history breast cancer, CHF,
chronic kidney disease, COPD, diabetes mellitus, hypertension

EXAM:
PORTABLE CHEST 1 VIEW

[chest ap]
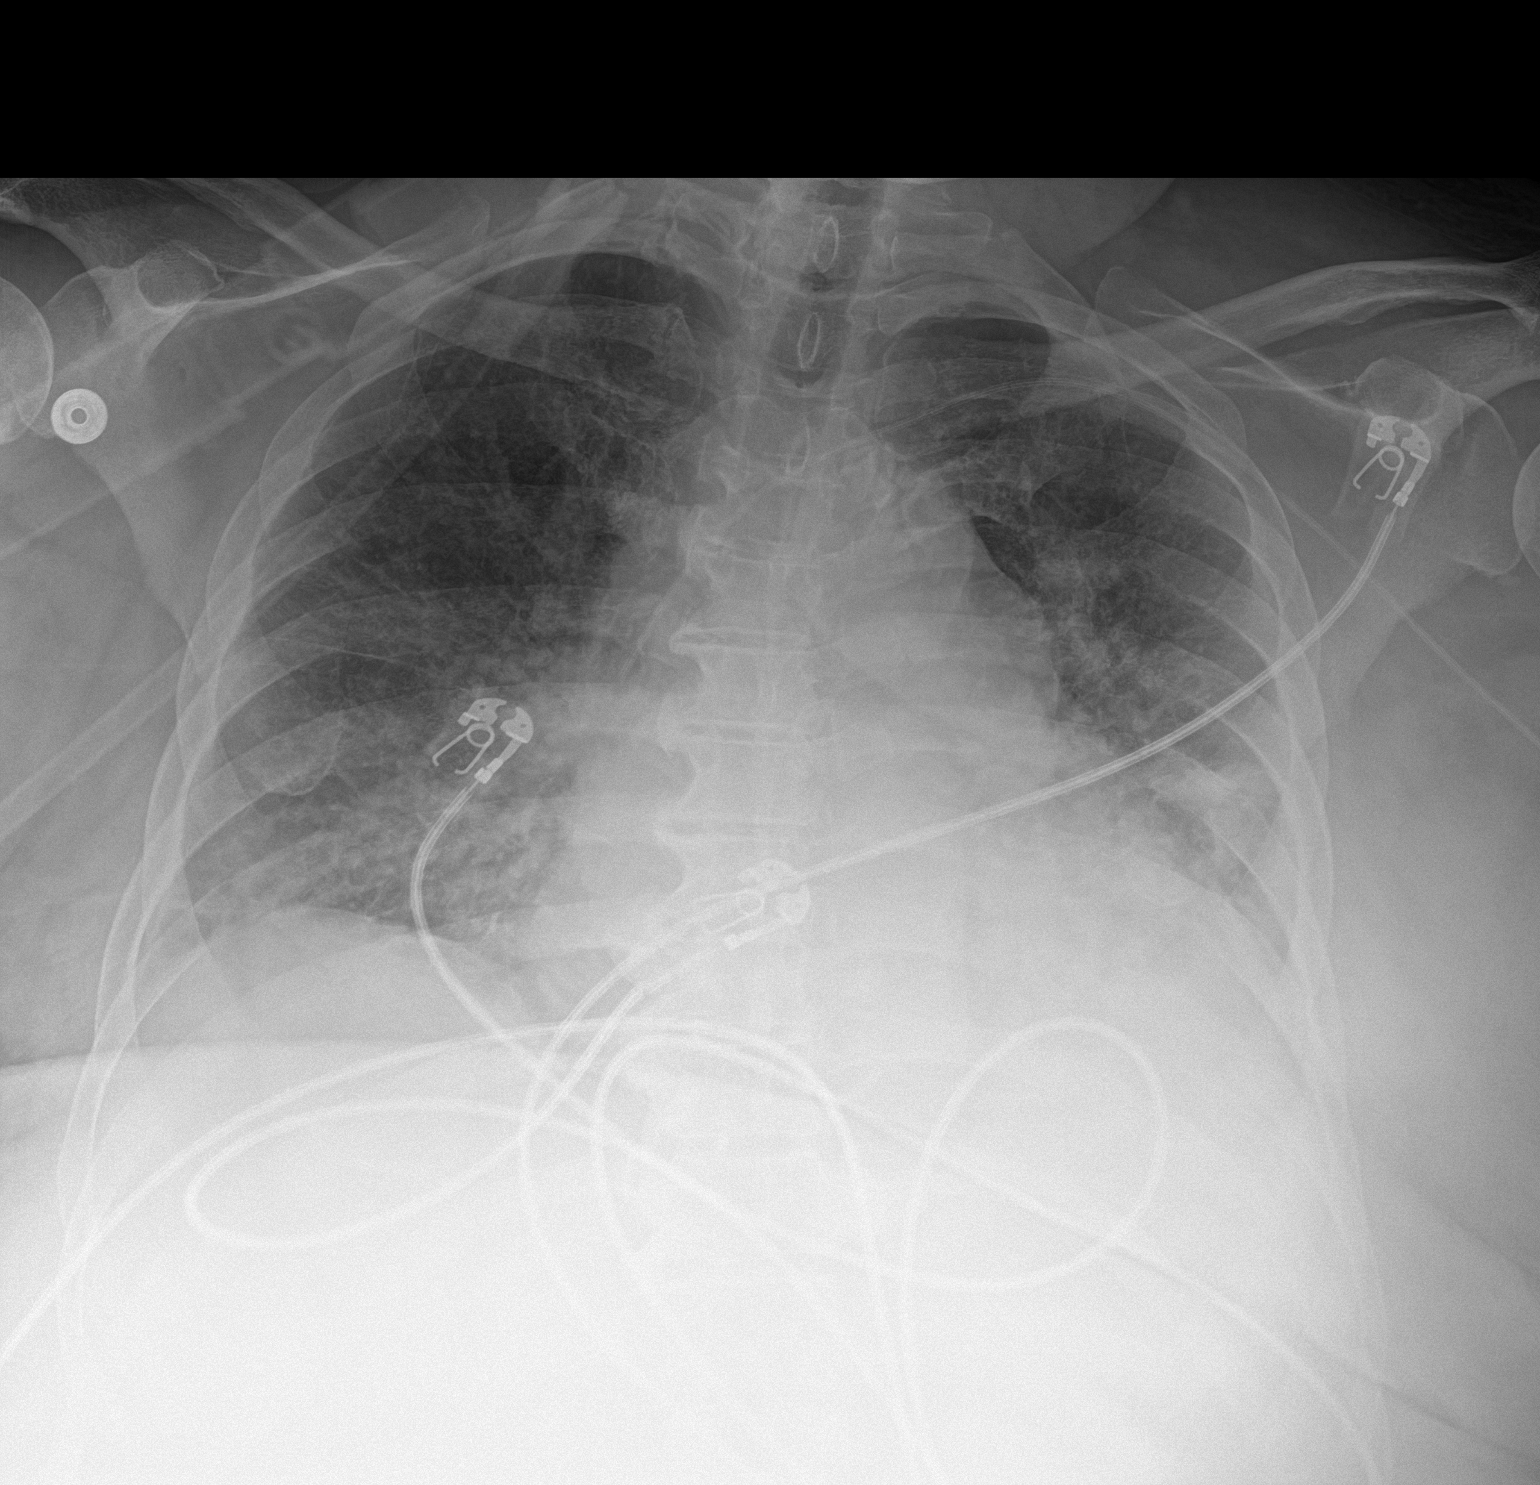

[1 of 1 positions shown; findings below may reference images not displayed]

FINDINGS: LEFT arm PICC line tip projects over SVC.

Enlargement of cardiac silhouette.

Mediastinal contours normal.

BILATERAL pulmonary infiltrates identified, basilar predominance,
favor multifocal pneumonia over pulmonary edema.

Subsegmental atelectasis lower LEFT lung.

Question minimal LEFT pleural effusion.

No pneumothorax.

Endplate spur formation thoracic spine.
IMPRESSION: Persistent BILATERAL pulmonary infiltrates, predominantly basilar,
favoring multifocal pneumonia.

## 2022-06-29 ENCOUNTER — Encounter: Payer: Self-pay | Admitting: Gastroenterology
